# Patient Record
Sex: Male | Born: 1965 | Race: White | Hispanic: No | Marital: Single | Smoking: Current every day smoker
Health system: Southern US, Community
[De-identification: ages and names within clinical notes are randomized; demographics above are authoritative.]

## PROBLEM LIST (undated history)

## (undated) DIAGNOSIS — J45909 Unspecified asthma, uncomplicated: Secondary | ICD-10-CM

## (undated) DIAGNOSIS — R195 Other fecal abnormalities: Secondary | ICD-10-CM

## (undated) DIAGNOSIS — M1990 Unspecified osteoarthritis, unspecified site: Secondary | ICD-10-CM

## (undated) DIAGNOSIS — R0600 Dyspnea, unspecified: Secondary | ICD-10-CM

## (undated) DIAGNOSIS — J449 Chronic obstructive pulmonary disease, unspecified: Secondary | ICD-10-CM

## (undated) DIAGNOSIS — F32A Depression, unspecified: Secondary | ICD-10-CM

## (undated) DIAGNOSIS — J9691 Respiratory failure, unspecified with hypoxia: Secondary | ICD-10-CM

## (undated) DIAGNOSIS — I219 Acute myocardial infarction, unspecified: Secondary | ICD-10-CM

## (undated) DIAGNOSIS — J189 Pneumonia, unspecified organism: Secondary | ICD-10-CM

## (undated) HISTORY — PX: TONSILLECTOMY AND ADENOIDECTOMY: SHX28

## (undated) HISTORY — DX: Unspecified osteoarthritis, unspecified site: M19.90

## (undated) HISTORY — DX: Chronic obstructive pulmonary disease, unspecified: J44.9

## (undated) HISTORY — DX: Respiratory failure, unspecified with hypoxia: J96.91

## (undated) HISTORY — DX: Depression, unspecified: F32.A

## (undated) HISTORY — DX: Other fecal abnormalities: R19.5

## (undated) HISTORY — PX: SKIN GRAFT: SHX250

---

## 2004-05-19 HISTORY — PX: BOWEL RESECTION: SHX1257

## 2017-05-19 DIAGNOSIS — I219 Acute myocardial infarction, unspecified: Secondary | ICD-10-CM

## 2017-05-19 HISTORY — DX: Acute myocardial infarction, unspecified: I21.9

## 2018-06-08 DIAGNOSIS — I2510 Atherosclerotic heart disease of native coronary artery without angina pectoris: Secondary | ICD-10-CM

## 2018-06-08 HISTORY — DX: Atherosclerotic heart disease of native coronary artery without angina pectoris: I25.10

## 2019-11-04 ENCOUNTER — Other Ambulatory Visit: Payer: Self-pay

## 2019-11-04 ENCOUNTER — Inpatient Hospital Stay (HOSPITAL_COMMUNITY)
Admission: EM | Admit: 2019-11-04 | Discharge: 2019-11-10 | DRG: 190 | Disposition: A | Discharge status: Home / Self Care | Payer: Medicaid Other | Attending: Internal Medicine | Admitting: Internal Medicine

## 2019-11-04 ENCOUNTER — Encounter (HOSPITAL_COMMUNITY): Payer: Self-pay | Admitting: *Deleted

## 2019-11-04 ENCOUNTER — Emergency Department (HOSPITAL_COMMUNITY): Payer: Medicaid Other

## 2019-11-04 DIAGNOSIS — R739 Hyperglycemia, unspecified: Secondary | ICD-10-CM | POA: Diagnosis present

## 2019-11-04 DIAGNOSIS — I219 Acute myocardial infarction, unspecified: Secondary | ICD-10-CM | POA: Insufficient documentation

## 2019-11-04 DIAGNOSIS — I252 Old myocardial infarction: Secondary | ICD-10-CM | POA: Diagnosis not present

## 2019-11-04 DIAGNOSIS — Z9861 Coronary angioplasty status: Secondary | ICD-10-CM

## 2019-11-04 DIAGNOSIS — J9601 Acute respiratory failure with hypoxia: Secondary | ICD-10-CM | POA: Diagnosis present

## 2019-11-04 DIAGNOSIS — J441 Chronic obstructive pulmonary disease with (acute) exacerbation: Secondary | ICD-10-CM | POA: Diagnosis present

## 2019-11-04 DIAGNOSIS — I2510 Atherosclerotic heart disease of native coronary artery without angina pectoris: Secondary | ICD-10-CM | POA: Diagnosis present

## 2019-11-04 DIAGNOSIS — F17200 Nicotine dependence, unspecified, uncomplicated: Secondary | ICD-10-CM | POA: Diagnosis present

## 2019-11-04 DIAGNOSIS — R0603 Acute respiratory distress: Secondary | ICD-10-CM

## 2019-11-04 DIAGNOSIS — J9621 Acute and chronic respiratory failure with hypoxia: Secondary | ICD-10-CM | POA: Diagnosis present

## 2019-11-04 DIAGNOSIS — Z20822 Contact with and (suspected) exposure to covid-19: Secondary | ICD-10-CM | POA: Diagnosis present

## 2019-11-04 DIAGNOSIS — J449 Chronic obstructive pulmonary disease, unspecified: Secondary | ICD-10-CM | POA: Insufficient documentation

## 2019-11-04 DIAGNOSIS — N400 Enlarged prostate without lower urinary tract symptoms: Secondary | ICD-10-CM | POA: Diagnosis present

## 2019-11-04 HISTORY — DX: Chronic obstructive pulmonary disease, unspecified: J44.9

## 2019-11-04 HISTORY — DX: Acute myocardial infarction, unspecified: I21.9

## 2019-11-04 LAB — CBC WITH DIFFERENTIAL/PLATELET
Abs Immature Granulocytes: 0.06 10*3/uL (ref 0.00–0.07)
Basophils Absolute: 0.1 10*3/uL (ref 0.0–0.1)
Basophils Relative: 0 %
Eosinophils Absolute: 0.2 10*3/uL (ref 0.0–0.5)
Eosinophils Relative: 2 %
HCT: 55.3 % — ABNORMAL HIGH (ref 39.0–52.0)
Hemoglobin: 18.7 g/dL — ABNORMAL HIGH (ref 13.0–17.0)
Immature Granulocytes: 1 %
Lymphocytes Relative: 15 %
Lymphs Abs: 2 10*3/uL (ref 0.7–4.0)
MCH: 32.7 pg (ref 26.0–34.0)
MCHC: 33.8 g/dL (ref 30.0–36.0)
MCV: 96.8 fL (ref 80.0–100.0)
Monocytes Absolute: 0.6 10*3/uL (ref 0.1–1.0)
Monocytes Relative: 4 %
Neutro Abs: 10.3 10*3/uL — ABNORMAL HIGH (ref 1.7–7.7)
Neutrophils Relative %: 78 %
Platelets: 263 10*3/uL (ref 150–400)
RBC: 5.71 MIL/uL (ref 4.22–5.81)
RDW: 12.5 % (ref 11.5–15.5)
WBC: 13.1 10*3/uL — ABNORMAL HIGH (ref 4.0–10.5)
nRBC: 0 % (ref 0.0–0.2)

## 2019-11-04 LAB — COMPREHENSIVE METABOLIC PANEL
ALT: 23 U/L (ref 0–44)
AST: 17 U/L (ref 15–41)
Albumin: 4.4 g/dL (ref 3.5–5.0)
Alkaline Phosphatase: 84 U/L (ref 38–126)
Anion gap: 12 (ref 5–15)
BUN: 12 mg/dL (ref 6–20)
CO2: 26 mmol/L (ref 22–32)
Calcium: 8.7 mg/dL — ABNORMAL LOW (ref 8.9–10.3)
Chloride: 98 mmol/L (ref 98–111)
Creatinine, Ser: 0.81 mg/dL (ref 0.61–1.24)
GFR calc Af Amer: >60 mL/min (ref >60)
GFR calc non Af Amer: >60 mL/min (ref >60)
Glucose, Bld: 178 mg/dL — ABNORMAL HIGH (ref 70–99)
Potassium: 4.4 mmol/L (ref 3.5–5.1)
Sodium: 136 mmol/L (ref 135–145)
Total Bilirubin: 1.1 mg/dL (ref 0.3–1.2)
Total Protein: 7.4 g/dL (ref 6.5–8.1)

## 2019-11-04 LAB — BRAIN NATRIURETIC PEPTIDE: B Natriuretic Peptide: 43 pg/mL (ref 0.0–100.0)

## 2019-11-04 LAB — TROPONIN I (HIGH SENSITIVITY)
Troponin I (High Sensitivity): <2 ng/L (ref <18)
Troponin I (High Sensitivity): <2 ng/L (ref <18)

## 2019-11-04 LAB — SARS CORONAVIRUS 2 BY RT PCR (HOSPITAL ORDER, PERFORMED IN ~~LOC~~ HOSPITAL LAB): SARS Coronavirus 2: NEGATIVE

## 2019-11-04 MED ORDER — FLUTICASONE FUROATE-VILANTEROL 100-25 MCG/INH IN AEPB
1.0000 | INHALATION_SPRAY | Freq: Every day | RESPIRATORY_TRACT | Status: DC
  Administered 2019-11-04 – 2019-11-07 (×4): 1 via RESPIRATORY_TRACT
  Filled 2019-11-04: qty 28

## 2019-11-04 MED ORDER — AZITHROMYCIN 250 MG PO TABS
500.0000 mg | ORAL_TABLET | Freq: Every day | ORAL | Status: AC
  Administered 2019-11-05 – 2019-11-08 (×4): 500 mg via ORAL
  Filled 2019-11-04 (×5): qty 2

## 2019-11-04 MED ORDER — ALBUTEROL (5 MG/ML) CONTINUOUS INHALATION SOLN
INHALATION_SOLUTION | RESPIRATORY_TRACT | Status: AC
  Filled 2019-11-04: qty 20

## 2019-11-04 MED ORDER — PREDNISONE 20 MG PO TABS
40.0000 mg | ORAL_TABLET | Freq: Every day | ORAL | Status: DC
  Administered 2019-11-07: 40 mg via ORAL
  Filled 2019-11-04 (×2): qty 2

## 2019-11-04 MED ORDER — SODIUM CHLORIDE 0.9 % IV SOLN
500.0000 mg | INTRAVENOUS | Status: AC
  Administered 2019-11-04: 500 mg via INTRAVENOUS
  Filled 2019-11-04: qty 500

## 2019-11-04 MED ORDER — METHYLPREDNISOLONE SODIUM SUCC 125 MG IJ SOLR
60.0000 mg | Freq: Four times a day (QID) | INTRAMUSCULAR | Status: AC
  Administered 2019-11-04 – 2019-11-05 (×4): 60 mg via INTRAVENOUS
  Filled 2019-11-04 (×4): qty 2

## 2019-11-04 MED ORDER — ALBUTEROL (5 MG/ML) CONTINUOUS INHALATION SOLN
10.0000 mg/h | INHALATION_SOLUTION | RESPIRATORY_TRACT | Status: DC
  Administered 2019-11-04: 10 mg/h via RESPIRATORY_TRACT
  Filled 2019-11-04: qty 20

## 2019-11-04 MED ORDER — IPRATROPIUM BROMIDE 0.02 % IN SOLN: 1.0000 mg | Freq: Once | RESPIRATORY_TRACT | Status: AC

## 2019-11-04 MED ORDER — ENOXAPARIN SODIUM 40 MG/0.4ML ~~LOC~~ SOLN
40.0000 mg | SUBCUTANEOUS | Status: DC
  Administered 2019-11-04 – 2019-11-09 (×6): 40 mg via SUBCUTANEOUS
  Filled 2019-11-04 (×6): qty 0.4

## 2019-11-04 MED ORDER — IPRATROPIUM BROMIDE 0.02 % IN SOLN
RESPIRATORY_TRACT | Status: AC
  Administered 2019-11-04: 1 mg via RESPIRATORY_TRACT
  Filled 2019-11-04: qty 2.5

## 2019-11-04 MED ORDER — NICOTINE 14 MG/24HR TD PT24
14.0000 mg | MEDICATED_PATCH | Freq: Every day | TRANSDERMAL | Status: DC
  Administered 2019-11-04 – 2019-11-10 (×7): 14 mg via TRANSDERMAL
  Filled 2019-11-04 (×7): qty 1

## 2019-11-04 MED ORDER — ALBUTEROL SULFATE (2.5 MG/3ML) 0.083% IN NEBU: 2.5000 mg | INHALATION_SOLUTION | RESPIRATORY_TRACT | Status: DC | PRN

## 2019-11-04 MED ORDER — IPRATROPIUM-ALBUTEROL 0.5-2.5 (3) MG/3ML IN SOLN
3.0000 mL | Freq: Four times a day (QID) | RESPIRATORY_TRACT | Status: DC
  Administered 2019-11-04 – 2019-11-07 (×9): 3 mL via RESPIRATORY_TRACT
  Filled 2019-11-04 (×10): qty 3

## 2019-11-04 NOTE — Progress Notes (Signed)
**Note De-Identified  Obfuscation** Patient removed from BIPAP and placed on 2.5 L HFNC; tolerating well.  RRT to continue to monitor.

## 2019-11-04 NOTE — ED Triage Notes (Signed)
Pt with resp distress, pt arrived with CPAP in place, albuterol and duo neb given en route.  Magnesium given as well.

## 2019-11-04 NOTE — H&P (Signed)
History and Physical  Charles Weeks ZDG:[id number] DOB: 1965/10/29 DOA: 11/04/2019  Referring physician: Dr Estell Harpin, ED physician PCP: Patient, No Pcp Per  Outpatient Specialists:   Patient Coming From: home  Chief Complaint: SOB, cough  HPI: Charles Weeks is a 54 y.o. male with a history of CAD, MI, COPD.  Patient just recently moved to [place] from [place].  He has no medications and has not seen a physician here.  He started having shortness of breath about 3 weeks ago which has been increasing.  He has been having cough with some purulent sputum production.  He had a relative give him some steroids over the past few days, without too much improvement.  No other palliating or provoking factors.  Denies fevers, chills, nausea, vomiting.  Emergency Department Course: Patient started on BiPAP and gradually weaned to nasal cannula.  Initial saturations were in the 80s.  Improved with steroids and BiPAP.  Remained stable on nasal cannula  Review of Systems:   Pt denies any fevers, chills, nausea, vomiting, diarrhea, constipation, abdominal pain, palpitations, headache, vision changes, lightheadedness, dizziness, melena, rectal bleeding.  Review of systems are otherwise negative  Past Medical History:  Diagnosis Date  . COPD (chronic obstructive pulmonary disease) (HCC)   . Coronary artery disease   . Myocardial infarct [facility])    History reviewed. No pertinent surgical history. Social History:  reports that he has been smoking. He does not have any smokeless tobacco history on file. No history on file for alcohol use and drug use. Patient lives at home  No Known Allergies  History reviewed. No pertinent family history.  Patient unaware of family history  Prior to Admission medications   Not on File    Physical Exam: BP 118/78   Pulse (!) 113   Temp (!) 97.5 F (36.4 C) (Tympanic)   Resp (!) 34   Ht 5\' 9"  (1.753 m)   Wt 81.6 kg   SpO2 95%   BMI 26.58 kg/m    . General: Middle-age male. Awake and alert and oriented x3. No acute cardiopulmonary distress.  HEENT: Normocephalic atraumatic.  Right and left ears normal in appearance.  Pupils equal, round, reactive to light. Extraocular muscles are intact. Sclerae anicteric and noninjected.  Moist mucosal membranes. No mucosal lesions.  . Neck: Neck supple without lymphadenopathy. No carotid bruits. No masses palpated.  . Cardiovascular: Regular rate with normal S1-S2 sounds. No murmurs, rubs, gallops auscultated. No JVD.  Marland Kitchen Respiratory: Managed breath sounds.  Expiratory wheezes.  No accessory muscle use. . Abdomen: Soft, nontender, nondistended. Active bowel sounds. No masses or hepatosplenomegaly  . Skin: No rashes, lesions, or ulcerations.  Dry, warm to touch. 2+ dorsalis pedis and radial pulses. . Musculoskeletal: No calf or leg pain. All major joints not erythematous nontender.  No upper or lower joint deformation.  Good ROM.  No contractures  . Psychiatric: Intact judgment and insight. Pleasant and cooperative. . Neurologic: No focal neurological deficits. Strength is 5/5 and symmetric in upper and lower extremities.  Cranial nerves II through XII are grossly intact.           Labs on Admission: I have personally reviewed following labs and imaging studies  CBC: Recent Labs  Lab 11/04/19 1409  WBC 13.1*  NEUTROABS 10.3*  HGB 18.7*  HCT 55.3*  MCV 96.8  PLT 263   Basic Metabolic Panel: Recent Labs  Lab 11/04/19 1409  NA 136  K 4.4  CL 98  CO2 26  GLUCOSE 178*  BUN 12  CREATININE 0.81  CALCIUM 8.7*   GFR: Estimated Creatinine Clearance: 104.3 mL/min (by C-G formula based on SCr of 0.81 mg/dL). Liver Function Tests: Recent Labs  Lab 11/04/19 1409  AST 17  ALT 23  ALKPHOS 84  BILITOT 1.1  PROT 7.4  ALBUMIN 4.4   No results for input(s): LIPASE, AMYLASE in the last 168 hours. No results for input(s): AMMONIA in the last 168 hours. Coagulation Profile: No results  for input(s): INR, PROTIME in the last 168 hours. Cardiac Enzymes: No results for input(s): CKTOTAL, CKMB, CKMBINDEX, TROPONINI in the last 168 hours. BNP (last 3 results) No results for input(s): PROBNP in the last 8760 hours. HbA1C: No results for input(s): HGBA1C in the last 72 hours. CBG: No results for input(s): GLUCAP in the last 168 hours. Lipid Profile: No results for input(s): CHOL, HDL, LDLCALC, TRIG, CHOLHDL, LDLDIRECT in the last 72 hours. Thyroid Function Tests: No results for input(s): TSH, T4TOTAL, FREET4, T3FREE, THYROIDAB in the last 72 hours. Anemia Panel: No results for input(s): VITAMINB12, FOLATE, FERRITIN, TIBC, IRON, RETICCTPCT in the last 72 hours. Urine analysis: No results found for: COLORURINE, APPEARANCEUR, LABSPEC, PHURINE, GLUCOSEU, HGBUR, BILIRUBINUR, KETONESUR, PROTEINUR, UROBILINOGEN, NITRITE, LEUKOCYTESUR Sepsis Labs: @LABRCNTIP (procalcitonin:4,lacticidven:4) ) Recent Results (from the past 240 hour(s))  SARS Coronavirus 2 by RT PCR (hospital order, performed in [facility] hospital lab) Nasopharyngeal Nasopharyngeal Swab     Status: None   Collection Time: 11/04/19  2:19 PM   Specimen: Nasopharyngeal Swab  Result Value Ref Range Status   SARS Coronavirus 2 NEGATIVE NEGATIVE Final    Comment: (NOTE) SARS-CoV-2 target nucleic acids are NOT DETECTED.  The SARS-CoV-2 RNA is generally detectable in upper and lower respiratory specimens during the acute phase of infection. The lowest concentration of SARS-CoV-2 viral copies this assay can detect is 250 copies / mL. A negative result does not preclude SARS-CoV-2 infection and should not be used as the sole basis for treatment or other patient management decisions.  A negative result may occur with improper specimen collection / handling, submission of specimen other than nasopharyngeal swab, presence of viral mutation(s) within the areas targeted by this assay, and inadequate number of viral  copies (<250 copies / mL). A negative result must be combined with clinical observations, patient history, and epidemiological information.  Fact Sheet for Patients:   StrictlyIdeas.no  Fact Sheet for Healthcare Providers: BankingDealers.co.za  This test is not yet approved or  cleared by the [place] FDA and has been authorized for detection and/or diagnosis of SARS-CoV-2 by FDA under an Emergency Use Authorization (EUA).  This EUA will remain in effect (meaning this test can be used) for the duration of the COVID-19 declaration under Section 564(b)(1) of the Act, 21 U.S.C. section 360bbb-3(b)(1), unless the authorization is terminated or revoked sooner.  Performed at [facility], [address], [place], [place] [address]      Radiological Exams on Admission: DG Chest [facility] 1 View  Result Date: 11/04/2019 CLINICAL DATA:  Shortness of breath EXAM: PORTABLE CHEST 1 VIEW COMPARISON:  None. FINDINGS: The heart size and mediastinal contours are within normal limits. Both lungs are clear but hyperinflated. The visualized skeletal structures show healing rib fractures on the right. IMPRESSION: COPD without acute abnormality. Healing right rib fractures. Electronically Signed   By: Inez Catalina M.D.   On: 11/04/2019 14:36    EKG: Independently reviewed.  Sinus tachycardia.  Biatrial enlargement.  Old anterior septal infarct.  No  acute ST changes.  Assessment/Plan: Principal Problem:   Acute respiratory failure with hypoxia (HCC) Active Problems:   Coronary artery disease   COPD with acute exacerbation [facility])    This patient was discussed with the ED physician, including pertinent vitals, physical exam findings, labs, and imaging.  We also discussed care given by the ED provider.  1. Acute respiratory failure with hypoxia a. Admit b. Oxygen therapy c. Maintain oxygen above 90% 2. COPD exacerbation Antibiotics:  Azithromycin DuoNeb's every 6 scheduled with albuterol every 2 when necessary Continue inhaled steroids and LA bronchodilator Solu-Medrol 60 mg IV every 6 hours Long-acting bronchodilator and inhaled corticosteroid Mucinex 3. Coronary artery disease with history of MI a.   DVT prophylaxis: Lovenox Consultants: None Code Status: Full code Family Communication: Daughter present during interview and exam Disposition Plan: Patient should be able to return home following stabilization   Levie Heritage, DO

## 2019-11-04 NOTE — ED Notes (Signed)
Pt is asking to come off BiPAP.  Resp Tech Toniann Fail) to place pt on HFNC once breathing treatment completed.

## 2019-11-04 NOTE — ED Triage Notes (Signed)
Epi IM and Solumedrol given

## 2019-11-04 NOTE — ED Provider Notes (Signed)
[facility] EMERGENCY DEPARTMENT Provider Note   CSN: [id number] Arrival date & time: 11/04/19  1402     History Chief Complaint  Patient presents with  . Respiratory Distress    Charles Weeks is a 54 y.o. male.  Patient with a history of COPD and MI.  Patient became extremely short of breath and was seen by the paramedics barely breathing.  They gave him Solu-Medrol albuterol Atrovent epinephrine and placed him on BiPAP.  When he arrived in the emergency department he was mildly lethargic on BiPAP  The history is provided by the patient and the EMS personnel. No language interpreter was used.  Shortness of Breath Severity:  Severe Onset quality:  Sudden Timing:  Constant Progression:  Worsening Chronicity:  Recurrent Context: activity   Relieved by:  Inhaler Worsened by:  Nothing Ineffective treatments:  Inhaler Associated symptoms: wheezing   Associated symptoms: no abdominal pain, no chest pain, no cough, no headaches and no rash   Risk factors: no recent alcohol use        Past Medical History:  Diagnosis Date  . COPD (chronic obstructive pulmonary disease) ([place])   . Myocardial infarct (HCC)     There are no problems to display for this patient.   History reviewed. No pertinent surgical history.     History reviewed. No pertinent family history.  Social History   Tobacco Use  . Smoking status: Smoker, Current Status Unknown  Substance Use Topics  . Alcohol use: Not on file  . Drug use: Not on file    Home Medications Prior to Admission medications   Not on File    Allergies    Patient has no known allergies.  Review of Systems   Review of Systems  Constitutional: Positive for fatigue. Negative for appetite change.  HENT: Negative for congestion, ear discharge and sinus pressure.   Eyes: Negative for discharge.  Respiratory: Positive for shortness of breath and wheezing. Negative for cough.   Cardiovascular: Negative for chest pain.    Gastrointestinal: Negative for abdominal pain and diarrhea.  Genitourinary: Negative for frequency and hematuria.  Musculoskeletal: Negative for back pain.  Skin: Negative for rash.  Neurological: Negative for seizures and headaches.  Psychiatric/Behavioral: Negative for hallucinations.    Physical Exam Updated Vital Signs BP 117/75   Pulse (!) 107   Temp (!) 97.5 F (36.4 C) (Tympanic)   Resp (!) 27   Ht 5\' 9"  (1.753 m)   Wt 81.6 kg   SpO2 100%   BMI 26.58 kg/m   Physical Exam Vitals and nursing note reviewed.  Constitutional:      Appearance: He is well-developed.     Comments: Lethargic   HENT:     Head: Normocephalic.     Nose: Nose normal.  Eyes:     General: No scleral icterus.    Conjunctiva/sclera: Conjunctivae normal.  Neck:     Thyroid: No thyromegaly.  Cardiovascular:     Rate and Rhythm: Tachycardia present.     Heart sounds: No murmur heard.  No friction rub. No gallop.   Pulmonary:     Breath sounds: No stridor. Wheezing present. No rales.     Comments: Patient on BiPAP Chest:     Chest wall: No tenderness.  Abdominal:     General: There is no distension.     Tenderness: There is no abdominal tenderness. There is no rebound.  Musculoskeletal:        General: Normal range of motion.  Cervical back: Neck supple.  Lymphadenopathy:     Cervical: No cervical adenopathy.  Skin:    Findings: No erythema or rash.  Neurological:     Motor: No abnormal muscle tone.     Coordination: Coordination normal.     Comments: Lethargic     ED Results / Procedures / Treatments   Labs (all labs ordered are listed, but only abnormal results are displayed) Labs Reviewed  CBC WITH DIFFERENTIAL/PLATELET - Abnormal; Notable for the following components:      Result Value   WBC 13.1 (*)    Hemoglobin 18.7 (*)    HCT 55.3 (*)    Neutro Abs 10.3 (*)    All other components within normal limits  COMPREHENSIVE METABOLIC PANEL - Abnormal; Notable for the  following components:   Glucose, Bld 178 (*)    Calcium 8.7 (*)    All other components within normal limits  SARS CORONAVIRUS 2 BY RT PCR (HOSPITAL ORDER, PERFORMED IN [place] [facility] LAB)  BRAIN NATRIURETIC PEPTIDE  TROPONIN I (HIGH SENSITIVITY)  TROPONIN I (HIGH SENSITIVITY)    EKG EKG Interpretation  Date/Time:  Friday November 04 2019 14:08:03 EDT Ventricular Rate:  111 PR Interval:    QRS Duration: 98 QT Interval:  327 QTC Calculation: 445 R Axis:   84 Text Interpretation: Sinus tachycardia Ventricular premature complex Aberrant complex LAE, consider biatrial enlargement Borderline right axis deviation Anteroseptal infarct, old Minimal ST depression, inferior leads Confirmed by Bethann Berkshire [phone]) on 11/04/2019 3:26:15 PM   Radiology DG Chest [place] 1 View  Result Date: 11/04/2019 CLINICAL DATA:  Shortness of breath EXAM: PORTABLE CHEST 1 VIEW COMPARISON:  None. FINDINGS: The heart size and mediastinal contours are within normal limits. Both lungs are clear but hyperinflated. The visualized skeletal structures show healing rib fractures on the right. IMPRESSION: COPD without acute abnormality. Healing right rib fractures. Electronically Signed   By: Alcide Clever M.D.   On: 11/04/2019 14:36    Procedures Procedures (including critical care time)  Medications Ordered in ED Medications  albuterol (PROVENTIL,VENTOLIN) solution continuous neb ( Nebulization Not Given 11/04/19 1437)  ipratropium (ATROVENT) nebulizer solution 1 mg (1 mg Nebulization Given 11/04/19 1436)    ED Course  I have reviewed the triage vital signs and the nursing notes.  Pertinent labs & imaging results that were available during my care of the patient were reviewed by me and considered in my medical decision making (see chart for details). CRITICAL CARE Performed by: Bethann Berkshire Total critical care time: 45 minutes Critical care time was exclusive of separately billable procedures and treating  other patients. Critical care was necessary to treat or prevent imminent or life-threatening deterioration. Critical care was time spent personally by me on the following activities: development of treatment plan with patient and/or surrogate as well as nursing, discussions with consultants, evaluation of patient's response to treatment, examination of patient, obtaining history from patient or surrogate, ordering and performing treatments and interventions, ordering and review of laboratory studies, ordering and review of radiographic studies, pulse oximetry and re-evaluation of patient's condition.    MDM Rules/Calculators/A&P                          Patient with severe COPD exacerbation.  He improved on BiPAP will be admitted to medicine       This patient presents to the ED for concern of shortness of breath, this involves an extensive number of treatment options,  and is a complaint that carries with it a high risk of complications and morbidity.  The differential diagnosis includes MI PE COPD   Lab Tests:  I Ordered, reviewed, and interpreted labs, which included CBC and chemistries which showed elevated white count and mild low calcium Medicines ordered:   I ordered medication albuterol Atrovent and BiPAP  Imaging Studies ordered:   I ordered imaging studies which included chest x-ray and  I independently visualized and interpreted imaging which showed COPD  Additional history obtained:   Additional history obtained from EMS  Previous records obtained and reviewed   Consultations Obtained:   I consulted hospitalist and discussed lab and imaging findings  Reevaluation:  After the interventions stated above, I reevaluated the patient and found improved  Critical Interventions:  .   Final Clinical Impression(s) / ED Diagnoses Final diagnoses:  None    Rx / DC Orders ED Discharge Orders    None       Bethann Berkshire, MD 11/04/19 1604

## 2019-11-04 NOTE — ED Notes (Signed)
Hospitalist at bedside with pt and family member.

## 2019-11-05 ENCOUNTER — Encounter (HOSPITAL_COMMUNITY): Payer: Self-pay | Admitting: Family Medicine

## 2019-11-05 ENCOUNTER — Other Ambulatory Visit: Payer: Self-pay

## 2019-11-05 LAB — BASIC METABOLIC PANEL
Anion gap: 10 (ref 5–15)
BUN: 14 mg/dL (ref 6–20)
CO2: 25 mmol/L (ref 22–32)
Calcium: 9.2 mg/dL (ref 8.9–10.3)
Chloride: 100 mmol/L (ref 98–111)
Creatinine, Ser: 0.7 mg/dL (ref 0.61–1.24)
GFR calc Af Amer: >60 mL/min (ref >60)
GFR calc non Af Amer: >60 mL/min (ref >60)
Glucose, Bld: 166 mg/dL — ABNORMAL HIGH (ref 70–99)
Potassium: 3.9 mmol/L (ref 3.5–5.1)
Sodium: 135 mmol/L (ref 135–145)

## 2019-11-05 LAB — CBC WITH DIFFERENTIAL/PLATELET
Abs Immature Granulocytes: 0.02 10*3/uL (ref 0.00–0.07)
Basophils Absolute: 0 10*3/uL (ref 0.0–0.1)
Basophils Relative: 0 %
Eosinophils Absolute: 0 10*3/uL (ref 0.0–0.5)
Eosinophils Relative: 0 %
HCT: 50.1 % (ref 39.0–52.0)
Hemoglobin: 17.4 g/dL — ABNORMAL HIGH (ref 13.0–17.0)
Immature Granulocytes: 0 %
Lymphocytes Relative: 11 %
Lymphs Abs: 0.6 10*3/uL — ABNORMAL LOW (ref 0.7–4.0)
MCH: 33 pg (ref 26.0–34.0)
MCHC: 34.7 g/dL (ref 30.0–36.0)
MCV: 94.9 fL (ref 80.0–100.0)
Monocytes Absolute: 0.1 10*3/uL (ref 0.1–1.0)
Monocytes Relative: 2 %
Neutro Abs: 5.2 10*3/uL (ref 1.7–7.7)
Neutrophils Relative %: 87 %
Platelets: 239 10*3/uL (ref 150–400)
RBC: 5.28 MIL/uL (ref 4.22–5.81)
RDW: 12.2 % (ref 11.5–15.5)
WBC: 6 10*3/uL (ref 4.0–10.5)
nRBC: 0 % (ref 0.0–0.2)

## 2019-11-05 LAB — HIV ANTIBODY (ROUTINE TESTING W REFLEX): HIV Screen 4th Generation wRfx: NONREACTIVE

## 2019-11-05 LAB — GLUCOSE, CAPILLARY
Glucose-Capillary: 163 mg/dL — ABNORMAL HIGH (ref 70–99)
Glucose-Capillary: 192 mg/dL — ABNORMAL HIGH (ref 70–99)

## 2019-11-05 LAB — PHOSPHORUS: Phosphorus: 3.3 mg/dL (ref 2.5–4.6)

## 2019-11-05 LAB — MAGNESIUM: Magnesium: 2.2 mg/dL (ref 1.7–2.4)

## 2019-11-05 MED ORDER — MONTELUKAST SODIUM 10 MG PO TABS
10.0000 mg | ORAL_TABLET | Freq: Every day | ORAL | Status: DC
  Administered 2019-11-05 – 2019-11-09 (×5): 10 mg via ORAL
  Filled 2019-11-05 (×5): qty 1

## 2019-11-05 MED ORDER — ENSURE MAX PROTEIN PO LIQD
11.0000 [oz_av] | Freq: Two times a day (BID) | ORAL | Status: DC
  Administered 2019-11-06 – 2019-11-10 (×10): 11 [oz_av] via ORAL

## 2019-11-05 MED ORDER — INSULIN ASPART 100 UNIT/ML ~~LOC~~ SOLN: 0.0000 [IU] | Freq: Every day | SUBCUTANEOUS | Status: DC

## 2019-11-05 MED ORDER — INSULIN ASPART 100 UNIT/ML ~~LOC~~ SOLN
0.0000 [IU] | Freq: Three times a day (TID) | SUBCUTANEOUS | Status: DC
  Administered 2019-11-05: 2 [IU] via SUBCUTANEOUS
  Administered 2019-11-07: 1 [IU] via SUBCUTANEOUS
  Administered 2019-11-07: 3 [IU] via SUBCUTANEOUS
  Administered 2019-11-07 – 2019-11-08 (×2): 1 [IU] via SUBCUTANEOUS
  Administered 2019-11-08: 5 [IU] via SUBCUTANEOUS
  Administered 2019-11-08: 1 [IU] via SUBCUTANEOUS
  Administered 2019-11-09 (×2): 2 [IU] via SUBCUTANEOUS
  Administered 2019-11-09: 1 [IU] via SUBCUTANEOUS
  Administered 2019-11-10: 2 [IU] via SUBCUTANEOUS
  Administered 2019-11-10: 5 [IU] via SUBCUTANEOUS

## 2019-11-05 MED ORDER — TERAZOSIN HCL 1 MG PO CAPS
2.0000 mg | ORAL_CAPSULE | Freq: Every day | ORAL | Status: DC
  Administered 2019-11-05 – 2019-11-10 (×6): 2 mg via ORAL
  Filled 2019-11-05 (×6): qty 2

## 2019-11-05 MED ORDER — BUSPIRONE HCL 5 MG PO TABS
30.0000 mg | ORAL_TABLET | Freq: Two times a day (BID) | ORAL | Status: DC
  Administered 2019-11-05 – 2019-11-10 (×10): 30 mg via ORAL
  Filled 2019-11-05 (×10): qty 6

## 2019-11-05 MED ORDER — FOLIC ACID 1 MG PO TABS
1.0000 mg | ORAL_TABLET | Freq: Every day | ORAL | Status: DC
  Administered 2019-11-05 – 2019-11-10 (×6): 1 mg via ORAL
  Filled 2019-11-05 (×6): qty 1

## 2019-11-05 MED ORDER — ACETAMINOPHEN 500 MG PO TABS
1000.0000 mg | ORAL_TABLET | Freq: Four times a day (QID) | ORAL | Status: DC | PRN
  Administered 2019-11-05 – 2019-11-06 (×2): 1000 mg via ORAL
  Filled 2019-11-05 (×2): qty 2

## 2019-11-05 MED ORDER — TICAGRELOR 90 MG PO TABS
90.0000 mg | ORAL_TABLET | Freq: Two times a day (BID) | ORAL | Status: DC
  Administered 2019-11-05 – 2019-11-10 (×11): 90 mg via ORAL
  Filled 2019-11-05 (×11): qty 1

## 2019-11-05 NOTE — Progress Notes (Signed)
Pt c/o of lower back pain 7/10. MD made aware. Awaiting new orders.

## 2019-11-05 NOTE — Progress Notes (Signed)
Initial Nutrition Assessment  RD working remotely.  DOCUMENTATION CODES:   Not applicable  INTERVENTION:  Provide Ensure Max Protein po BID, each supplement provides 150 kcal and 30 grams of protein.  NUTRITION DIAGNOSIS:   Increased nutrient needs related to catabolic illness (COPD) as evidenced by estimated needs.  GOAL:   Patient will meet greater than or equal to 90% of their needs  MONITOR:   PO intake, Supplement acceptance, Labs, Weight trends, I & O's  REASON FOR ASSESSMENT:   Consult Assessment of nutrition requirement/status  ASSESSMENT:   54 year old male with PMHx of COPD, CAD, hx MI admitted with acute exacerbation of COPD, hyperglycemia.   Spoke with patient over the phone. He reports his appetite has been "fair" at home PTA. He does endorse it has been decreased from usual. He typically eats 2 meals per day. He is unable to describe details on typical intake and reports it "depends" on how he is feeling. He ate 75% of his breakfast this morning. Discussed importance of adequate intake of calories and protein. Patient is amenable to drinking Ensure Max to help meet increased needs.  Patient denies any weight loss and reports he is weight-stable. Current weight in chart is 81.6 kg (180 lbs) but unsure if this is a stated or measured weight. No weight history in chart to trend.  Medications reviewed and include: azithromycin, folic acid 1 mg daily, Novolog 0-9 units Tid, Novolog 0-5 units QHS, nicotine patch, prednisone 40 mg daily with breakfast.  Labs reviewed: glucose 166. Per MD note HgbA1c was ordered.  NUTRITION - FOCUSED PHYSICAL EXAM:  Unable to complete at this time as RD is working remotely.  Diet Order:   Diet Order            Diet Heart Room service appropriate? Yes; Fluid consistency: Thin  Diet effective now                EDUCATION NEEDS:   No education needs have been identified at this time  Skin:  Skin Assessment: Reviewed RN  Assessment  Last BM:  11/04/2019  Height:   Ht Readings from Last 1 Encounters:  11/04/19 5\' 9"  (1.753 m)   Weight:   Wt Readings from Last 1 Encounters:  11/04/19 81.6 kg   BMI:  Body mass index is 26.58 kg/m.  Estimated Nutritional Needs:   Kcal:  2100-2300  Protein:  105-115 grams  Fluid:  2.1-2.3 L/day  11/06/19, MS, RD, LDN Pager number available on Amion

## 2019-11-05 NOTE — Progress Notes (Signed)
PROGRESS NOTE  [name] SNK:[id number] DOB: 12-07-1965 DOA: 11/04/2019 PCP: Patient, No Pcp Per  HPI/Recap of past 24 hours:  Charles Weeks is a 54 y.o. male with a history of CAD, MI, COPD.  Patient just recently moved to [place] from [place].  He has no medications and has not seen a physician here.  He started having shortness of breath about 3 weeks ago which has been increasing.  He has been having cough with some purulent sputum production.  He had a relative give him some steroids over the past few days, without too much improvement.  No other palliating or provoking factors.  Denies fevers, chills, nausea, vomiting.  Emergency Department Course: Patient started on BiPAP and gradually weaned to nasal cannula.  Initial saturations were in the 80s.  Improved with steroids and BiPAP.  Remained stable on nasal cannula.  11/05/19: Seen and examined at bedside.  Reports breathing is improved.  States that in [place] he was prescribed 3 L nasal cannula continuously but has not used it since last winter.  Assessment/Plan: Principal Problem:   Acute respiratory failure with hypoxia (HCC) Active Problems:   Coronary artery disease   COPD with acute exacerbation (HCC)  Acute on chronic hypoxic respiratory failure secondary to acute COPD exacerbation Presented with hypoxia with O2 saturation in the 80s initially requiring NIV then transitioned to 3 L and now 2 L nasal cannula. States that in [place] he was prescribed 3 L nasal cannula continuously but has not used it since last winter. Continue bronchodilators, pulmonary toilet, and oxygen supplementation Personally reviewed chest x-ray which showed no lobular pulmonary infiltrates to suggest pneumonia Maintain O2 saturation greater than 88 to 90% TOC consulted to assist with PCP and pulmonology referrals  Acute COPD exacerbation Ongoing tobacco use Possibly exacerbated by viral etiology COVID-19 negative On IV  Solu-Medrol 60 mg 3 times daily x1 day We will transition to p.o. prednisone 40 mg daily on 11/06/2019 x 4 days. Continue management as stated above  Hyperglycemia Presented with elevated serum glucose Obtain hemoglobin A1c Start insulin sliding scale sensitive  Coronary artery disease/history of MI Pharmacy to review home medications Resume home regimen once verified by pharmacy Denies any anginal symptoms at the time of this exam Brilinta on list of previous medications Not on statin, obtain lipid panel  Tobacco use disorder Tobacco cessation counseling Nicotine patch  BPH Continue terazosin Monitor urine output     Code Status: Full code  Family Communication: None at bedside  Disposition Plan: Patient is from home.  Anticipate discharge to home in the next 24 to 48 hours once respiratory status is close to baseline.  May need DME oxygen for discharge.   Consultants:  None.  Procedures:  None.  Antimicrobials:  Azithromycin  DVT prophylaxis: Subcu Lovenox daily  Status is: Inpatient    Dispo: The patient is from: Home.              Anticipated d/c is to: Home.              Anticipated d/c date is: June 20               Patient currently not stable for discharge.       Objective: Vitals:   11/05/19 0158 11/05/19 0427 11/05/19 0720 11/05/19 0800  BP:  125/77  (!) 125/92  Pulse:  81  88  Resp:  16  18  Temp:  97.7 F (36.5 C)  98.3 F (  36.8 C)  TempSrc:  Oral    SpO2: 93% 95% 95% 96%  Weight:      Height:        Intake/Output Summary (Last 24 hours) at 11/05/2019 1255 Last data filed at 11/05/2019 0900 Gross per 24 hour  Intake 240 ml  Output --  Net 240 ml   Filed Weights   11/04/19 1404  Weight: 81.6 kg    Exam:  . General: 54 y.o. year-old male well developed well nourished in no acute distress.  Alert and oriented x3. . Cardiovascular: Regular rate and rhythm with no rubs or gallops.  No thyromegaly or JVD noted.    Marland Kitchen Respiratory: Mild rales at bases no wheezing noted.  Poor inspiratory effort.   . Abdomen: Soft nontender nondistended with normal bowel sounds x4 quadrants. . Musculoskeletal: No lower extremity edema. 2/4 pulses in all 4 extremities. Marland Kitchen Psychiatry: Mood is appropriate for condition and setting   Data Reviewed: CBC: Recent Labs  Lab 11/04/19 1409 11/05/19 0648  WBC 13.1* 6.0  NEUTROABS 10.3* 5.2  HGB 18.7* 17.4*  HCT 55.3* 50.1  MCV 96.8 94.9  PLT 263 239   Basic Metabolic Panel: Recent Labs  Lab 11/04/19 1409 11/05/19 0648  NA 136 135  K 4.4 3.9  CL 98 100  CO2 26 25  GLUCOSE 178* 166*  BUN 12 14  CREATININE 0.81 0.70  CALCIUM 8.7* 9.2  MG  --  2.2  PHOS  --  3.3   GFR: Estimated Creatinine Clearance: 105.6 mL/min (by C-G formula based on SCr of 0.7 mg/dL). Liver Function Tests: Recent Labs  Lab 11/04/19 1409  AST 17  ALT 23  ALKPHOS 84  BILITOT 1.1  PROT 7.4  ALBUMIN 4.4   No results for input(s): LIPASE, AMYLASE in the last 168 hours. No results for input(s): AMMONIA in the last 168 hours. Coagulation Profile: No results for input(s): INR, PROTIME in the last 168 hours. Cardiac Enzymes: No results for input(s): CKTOTAL, CKMB, CKMBINDEX, TROPONINI in the last 168 hours. BNP (last 3 results) No results for input(s): PROBNP in the last 8760 hours. HbA1C: No results for input(s): HGBA1C in the last 72 hours. CBG: No results for input(s): GLUCAP in the last 168 hours. Lipid Profile: No results for input(s): CHOL, HDL, LDLCALC, TRIG, CHOLHDL, LDLDIRECT in the last 72 hours. Thyroid Function Tests: No results for input(s): TSH, T4TOTAL, FREET4, T3FREE, THYROIDAB in the last 72 hours. Anemia Panel: No results for input(s): VITAMINB12, FOLATE, FERRITIN, TIBC, IRON, RETICCTPCT in the last 72 hours. Urine analysis: No results found for: COLORURINE, APPEARANCEUR, LABSPEC, PHURINE, GLUCOSEU, HGBUR, BILIRUBINUR, KETONESUR, PROTEINUR, UROBILINOGEN, NITRITE,  LEUKOCYTESUR Sepsis Labs: @LABRCNTIP (procalcitonin:4,lacticidven:4)  ) Recent Results (from the past 240 hour(s))  SARS Coronavirus 2 by RT PCR (hospital order, performed in [facility] hospital lab) Nasopharyngeal Nasopharyngeal Swab     Status: None   Collection Time: 11/04/19  2:19 PM   Specimen: Nasopharyngeal Swab  Result Value Ref Range Status   SARS Coronavirus 2 NEGATIVE NEGATIVE Final    Comment: (NOTE) SARS-CoV-2 target nucleic acids are NOT DETECTED.  The SARS-CoV-2 RNA is generally detectable in upper and lower respiratory specimens during the acute phase of infection. The lowest concentration of SARS-CoV-2 viral copies this assay can detect is 250 copies / mL. A negative result does not preclude SARS-CoV-2 infection and should not be used as the sole basis for treatment or other patient management decisions.  A negative result may occur with improper specimen collection /  handling, submission of specimen other than nasopharyngeal swab, presence of viral mutation(s) within the areas targeted by this assay, and inadequate number of viral copies (<250 copies / mL). A negative result must be combined with clinical observations, patient history, and epidemiological information.  Fact Sheet for Patients:   StrictlyIdeas.no  Fact Sheet for Healthcare Providers: BankingDealers.co.za  This test is not yet approved or  cleared by the [place] FDA and has been authorized for detection and/or diagnosis of SARS-CoV-2 by FDA under an Emergency Use Authorization (EUA).  This EUA will remain in effect (meaning this test can be used) for the duration of the COVID-19 declaration under Section 564(b)(1) of the Act, 21 U.S.C. section 360bbb-3(b)(1), unless the authorization is terminated or revoked sooner.  Performed at [facility], [address] [place], [place] [address]       Studies: DG Chest [facility] 1 View  Result Date:  11/04/2019 CLINICAL DATA:  Shortness of breath EXAM: PORTABLE CHEST 1 VIEW COMPARISON:  None. FINDINGS: The heart size and mediastinal contours are within normal limits. Both lungs are clear but hyperinflated. The visualized skeletal structures show healing rib fractures on the right. IMPRESSION: COPD without acute abnormality. Healing right rib fractures. Electronically Signed   By: Inez Catalina M.D.   On: 11/04/2019 14:36    Scheduled Meds: . azithromycin  500 mg Oral Daily  . enoxaparin (LOVENOX) injection  40 mg Subcutaneous Q24H  . fluticasone furoate-vilanterol  1 puff Inhalation Daily  . folic acid  1 mg Oral Daily  . ipratropium-albuterol  3 mL Nebulization Q6H  . methylPREDNISolone (SOLU-MEDROL) injection  60 mg Intravenous Q6H   Followed by  . [START ON 11/06/2019] predniSONE  40 mg Oral Q breakfast  . montelukast  10 mg Oral QHS  . nicotine  14 mg Transdermal Daily  . terazosin  2 mg Oral Daily    Continuous Infusions: . albuterol 10 mg/hr (11/04/19 1434)     LOS: 1 day     Kayleen Memos, MD [facility] Pager [phone]  If 7PM-7AM, please contact night-coverage [email or web address] Password [facility] 11/05/2019, 12:55 PM

## 2019-11-05 NOTE — Evaluation (Signed)
Physical Therapy Evaluation Patient Details Name: Charles Weeks MRN: [id number] DOB: [birth year] Today's Date: 11/05/2019   History of Present Illness  Charles Weeks is a 54 y.o. male with a history of CAD, MI, COPD.  Patient just recently moved to [place] from [place].  He has no medications and has not seen a physician here.  He started having shortness of breath about 3 weeks ago which has been increasing.  He has been having cough with some purulent sputum production.  He had a relative give him some steroids over the past few days, without too much improvement.  No other palliating or provoking factors.  Denies fevers, chills, nausea, vomiting.    Clinical Impression  Patient functioning at baseline for functional mobility and gait, limited mostly due to SOB requiring standing rest breaks before ambulating back to room, on 2 LPM with SpO2 dropping from 93 to 89% during ambulation, after returning to room and sitting at bedside, SpO2 at 90%.  Plan:  Patient discharged from physical therapy to care of nursing for ambulation daily as tolerated for length of stay.     Follow Up Recommendations No PT follow up    Equipment Recommendations  None recommended by PT    Recommendations for Other Services       Precautions / Restrictions Precautions Precautions: None Precaution Comments: Home O2 dependent Restrictions Weight Bearing Restrictions: No      Mobility  Bed Mobility Overal bed mobility: Independent                Transfers Overall transfer level: Modified independent                  Ambulation/Gait Ambulation/Gait assistance: Modified independent (Device/Increase time) Gait Distance (Feet): 100 Feet Assistive device: None Gait Pattern/deviations: WFL(Within Functional Limits) Gait velocity: decreased   General Gait Details: demonstrates good return for ambulation in room and hallways without loss of balance, limited secondary to SOB, on 2 LPM  O2 with SpO2 dropping from 93% to 89%  Stairs            Wheelchair Mobility    Modified Rankin (Stroke Patients Only)       Balance Overall balance assessment: No apparent balance deficits (not formally assessed)                                           Pertinent Vitals/Pain      Home Living Family/patient expects to be discharged to:: Private residence Living Arrangements: Other relatives Available Help at Discharge: Family;Available PRN/intermittently Type of Home: House Home Access: Stairs to enter Entrance Stairs-Rails: None Entrance Stairs-Number of Steps: 3 Home Layout: Two level;Able to live on main level with bedroom/bathroom Home Equipment: Shower seat;Bedside commode      Prior Function Level of Independence: Independent         Comments: household and short distanced community ambulator without AD     Hand Dominance        Extremity/Trunk Assessment   Upper Extremity Assessment Upper Extremity Assessment: Overall WFL for tasks assessed    Lower Extremity Assessment Lower Extremity Assessment: Overall WFL for tasks assessed    Cervical / Trunk Assessment Cervical / Trunk Assessment: Normal  Communication   Communication: No difficulties  Cognition Arousal/Alertness: Awake/alert Behavior During Therapy: WFL for tasks assessed/performed Overall Cognitive Status: Within Functional Limits for tasks assessed  General Comments      Exercises     Assessment/Plan    PT Assessment Patent does not need any further PT services  PT Problem List         PT Treatment Interventions      PT Goals (Current goals can be found in the Care Plan section)  Acute Rehab PT Goals Patient Stated Goal: return home PT Goal Formulation: With patient/family Time For Goal Achievement: 11/05/19 Potential to Achieve Goals: Good    Frequency     Barriers to discharge         Co-evaluation               AM-PAC PT "6 Clicks" Mobility  Outcome Measure Help needed turning from your back to your side while in a flat bed without using bedrails?: None Help needed moving from lying on your back to sitting on the side of a flat bed without using bedrails?: None Help needed moving to and from a bed to a chair (including a wheelchair)?: None Help needed standing up from a chair using your arms (e.g., wheelchair or bedside chair)?: None Help needed to walk in hospital room?: None Help needed climbing 3-5 steps with a railing? : None 6 Click Score: 24    End of Session Equipment Utilized During Treatment: Oxygen Activity Tolerance: Patient tolerated treatment well;Patient limited by fatigue;Other (comment) (limited due to SOB) Patient left: in bed;with family/visitor present;with call bell/phone within reach (seated at bedside)   PT Visit Diagnosis: Unsteadiness on feet (R26.81);Other abnormalities of gait and mobility (R26.89);Muscle weakness (generalized) (M62.81)    Time: 6226-3335 PT Time Calculation (min) (ACUTE ONLY): 20 min   Charges:   PT Evaluation $PT Eval Moderate Complexity: 1 Mod PT Treatments $Therapeutic Activity: 8-22 mins        11:05 AM, 11/05/19 [name], MPT Physical Therapist with [facility] [phone] office [phone] mobile phone

## 2019-11-05 NOTE — Progress Notes (Signed)
SATURATION QUALIFICATIONS: (This note is used to comply with regulatory documentation for home oxygen)  Patient Saturations on Room Air at Rest = 91%  Patient Saturations on Room Air while Ambulating = 85%  Patient Saturations on 2 Liters of oxygen while Ambulating = 93%  Please briefly explain why patient needs home oxygen:  While ambulating on room air pt complained of SOB and increase work of breathing and desated to 85%.

## 2019-11-06 LAB — GLUCOSE, CAPILLARY
Glucose-Capillary: 130 mg/dL — ABNORMAL HIGH (ref 70–99)
Glucose-Capillary: 126 mg/dL — ABNORMAL HIGH (ref 70–99)
Glucose-Capillary: 164 mg/dL — ABNORMAL HIGH (ref 70–99)
Glucose-Capillary: 127 mg/dL — ABNORMAL HIGH (ref 70–99)

## 2019-11-06 LAB — HEMOGLOBIN A1C
Hgb A1c MFr Bld: 6.3 % — ABNORMAL HIGH (ref 4.8–5.6)
Mean Plasma Glucose: 134.11 mg/dL

## 2019-11-06 MED ORDER — SODIUM CHLORIDE 3 % IN NEBU
4.0000 mL | INHALATION_SOLUTION | Freq: Two times a day (BID) | RESPIRATORY_TRACT | Status: DC
  Administered 2019-11-06 – 2019-11-07 (×2): 4 mL via RESPIRATORY_TRACT
  Filled 2019-11-06 (×2): qty 4

## 2019-11-06 MED ORDER — MORPHINE SULFATE (PF) 2 MG/ML IV SOLN
2.0000 mg | INTRAVENOUS | Status: DC | PRN
  Administered 2019-11-06 – 2019-11-10 (×15): 2 mg via INTRAVENOUS
  Filled 2019-11-06 (×15): qty 1

## 2019-11-06 MED ORDER — METHYLPREDNISOLONE SODIUM SUCC 125 MG IJ SOLR
60.0000 mg | Freq: Two times a day (BID) | INTRAMUSCULAR | Status: AC
  Administered 2019-11-06 (×2): 60 mg via INTRAVENOUS
  Filled 2019-11-06 (×2): qty 2

## 2019-11-06 MED ORDER — SENNOSIDES-DOCUSATE SODIUM 8.6-50 MG PO TABS
2.0000 | ORAL_TABLET | Freq: Every day | ORAL | Status: DC
  Administered 2019-11-06 – 2019-11-09 (×4): 2 via ORAL
  Filled 2019-11-06 (×4): qty 2

## 2019-11-06 MED ORDER — GUAIFENESIN ER 600 MG PO TB12
1200.0000 mg | ORAL_TABLET | Freq: Two times a day (BID) | ORAL | Status: AC
  Administered 2019-11-06 – 2019-11-08 (×6): 1200 mg via ORAL
  Filled 2019-11-06 (×6): qty 2

## 2019-11-06 NOTE — Discharge Summary (Signed)
PROGRESS NOTE  Hazaiah Edgecombe CHE:[id number] DOB: Jul 07, 1965 DOA: 11/04/2019 PCP: Patient, No Pcp Per  HPI/Recap of past 24 hours:  Shakeel Disney is a 54 y.o. male with a history of CAD, MI, COPD.  Patient just recently moved to [place] from [place].  He has no medications and has not seen a physician here.  He started having shortness of breath about 3 weeks ago which has been increasing.  He has been having cough with some purulent sputum production.  He had a relative give him some steroids over the past few days, without too much improvement.  No other palliating or provoking factors.  Denies fevers, chills, nausea, vomiting.  Emergency Department Course: Patient started on BiPAP and gradually weaned to nasal cannula.  Initial saturations were in the 80s.  Improved with steroids and BiPAP.  Remained stable on nasal cannula.  11/06/19: Seen and examined at bedside.  Complains of persistent dyspnea and cough.  States he is not back to his baseline.  He is on 2 L oxygen nasal cannula continuously at home.  Added IV steroids and pulmonary toilet.  Assessment/Plan: Principal Problem:   Acute respiratory failure with hypoxia (HCC) Active Problems:   Coronary artery disease   COPD with acute exacerbation (HCC)  Acute on chronic hypoxic respiratory failure secondary to acute COPD exacerbation Presented with hypoxia with O2 saturation in the 80s initially requiring NIV then transitioned to 3 L and now 2 L oxygen nasal cannula. On oxygen supplementation at baseline continuously Continue bronchodilators and oxygen supplementation Added IV steroids and pulmonary toilet Personally reviewed chest x-ray which showed no lobular pulmonary infiltrates to suggest pneumonia Maintain O2 saturation greater than 88 to 90% TOC consulted to assist with PCP and pulmonology referrals  Acute COPD exacerbation Ongoing tobacco use; tobacco cessation counseling at bedside. COVID-19 negative Added IV  steroids Solu-Medrol 60 mg twice daily for 2 doses Transition to p.o. prednisone 40 mg daily on 11/07/2019 x 3 days Continue management as stated above  Hyperglycemia/impaired glucose tolerance Presented with elevated serum glucose Hemoglobin A1c 6.3 on 619 1221 Continue insulin sliding scale sensitive  Coronary artery disease status post PCI/history of MI Denies any anginal symptoms at the time of this exam Continue Brilinta  Not on statin, obtain lipid panel  Tobacco use disorder Tobacco cessation counseling Nicotine patch  BPH Continue terazosin Monitor urine output     Code Status: Full code  Family Communication: None at bedside  Disposition Plan: Patient is from home.  Anticipate discharge to home on 11/07/2019.   Consultants:  None.  Procedures:  None.  Antimicrobials:  Azithromycin  DVT prophylaxis: Subcu Lovenox daily  Status is: Inpatient    Dispo: The patient is from: Home.              Anticipated d/c is to: Home.              Anticipated d/c date is: June 21/21              Patient currently not stable for discharge, on IV steroids.       Objective: Vitals:   11/05/19 2058 11/06/19 0531 11/06/19 0730 11/06/19 0734  BP: 114/75 (!) 122/91    Pulse: 92 75    Resp: 17 17    Temp: 97.7 F (36.5 C) (!) 97.4 F (36.3 C)    TempSrc: Oral Oral    SpO2: 94% 93% 99% 99%  Weight:      Height:  Intake/Output Summary (Last 24 hours) at 11/06/2019 1259 Last data filed at 11/06/2019 0272 Gross per 24 hour  Intake 240 ml  Output --  Net 240 ml   Filed Weights   11/04/19 1404  Weight: 81.6 kg    Exam:  . General: 54 y.o. year-old male well developed well nourished in no acute distress.  Alert and oriented x3. . Cardiovascular: Regular rate and rhythm with no rubs or gallops.  No thyromegaly or JVD noted.   Marland Kitchen Respiratory: Mild diffuse wheezing bilaterally.  Good respiratory effort. . Abdomen: Soft nontender nondistended with  normal bowel sounds x4 quadrants. . Musculoskeletal: No lower extremity edema. 2/4 pulses in all 4 extremities. Marland Kitchen Psychiatry: Mood is appropriate for condition and setting   Data Reviewed: CBC: Recent Labs  Lab 11/04/19 1409 11/05/19 0648  WBC 13.1* 6.0  NEUTROABS 10.3* 5.2  HGB 18.7* 17.4*  HCT 55.3* 50.1  MCV 96.8 94.9  PLT 263 239   Basic Metabolic Panel: Recent Labs  Lab 11/04/19 1409 11/05/19 0648  NA 136 135  K 4.4 3.9  CL 98 100  CO2 26 25  GLUCOSE 178* 166*  BUN 12 14  CREATININE 0.81 0.70  CALCIUM 8.7* 9.2  MG  --  2.2  PHOS  --  3.3   GFR: Estimated Creatinine Clearance: 105.6 mL/min (by C-G formula based on SCr of 0.7 mg/dL). Liver Function Tests: Recent Labs  Lab 11/04/19 1409  AST 17  ALT 23  ALKPHOS 84  BILITOT 1.1  PROT 7.4  ALBUMIN 4.4   No results for input(s): LIPASE, AMYLASE in the last 168 hours. No results for input(s): AMMONIA in the last 168 hours. Coagulation Profile: No results for input(s): INR, PROTIME in the last 168 hours. Cardiac Enzymes: No results for input(s): CKTOTAL, CKMB, CKMBINDEX, TROPONINI in the last 168 hours. BNP (last 3 results) No results for input(s): PROBNP in the last 8760 hours. HbA1C: Recent Labs    11/05/19 0648  HGBA1C 6.3*   CBG: Recent Labs  Lab 11/05/19 1650 11/05/19 2056 11/06/19 0829 11/06/19 1115  GLUCAP 163* 192* 130* 126*   Lipid Profile: No results for input(s): CHOL, HDL, LDLCALC, TRIG, CHOLHDL, LDLDIRECT in the last 72 hours. Thyroid Function Tests: No results for input(s): TSH, T4TOTAL, FREET4, T3FREE, THYROIDAB in the last 72 hours. Anemia Panel: No results for input(s): VITAMINB12, FOLATE, FERRITIN, TIBC, IRON, RETICCTPCT in the last 72 hours. Urine analysis: No results found for: COLORURINE, APPEARANCEUR, LABSPEC, PHURINE, GLUCOSEU, HGBUR, BILIRUBINUR, KETONESUR, PROTEINUR, UROBILINOGEN, NITRITE, LEUKOCYTESUR Sepsis  Labs: @LABRCNTIP (procalcitonin:4,lacticidven:4)  ) Recent Results (from the past 240 hour(s))  SARS Coronavirus 2 by RT PCR (hospital order, performed in [facility] hospital lab) Nasopharyngeal Nasopharyngeal Swab     Status: None   Collection Time: 11/04/19  2:19 PM   Specimen: Nasopharyngeal Swab  Result Value Ref Range Status   SARS Coronavirus 2 NEGATIVE NEGATIVE Final    Comment: (NOTE) SARS-CoV-2 target nucleic acids are NOT DETECTED.  The SARS-CoV-2 RNA is generally detectable in upper and lower respiratory specimens during the acute phase of infection. The lowest concentration of SARS-CoV-2 viral copies this assay can detect is 250 copies / mL. A negative result does not preclude SARS-CoV-2 infection and should not be used as the sole basis for treatment or other patient management decisions.  A negative result may occur with improper specimen collection / handling, submission of specimen other than nasopharyngeal swab, presence of viral mutation(s) within the areas targeted by this assay, and inadequate  number of viral copies (<250 copies / mL). A negative result must be combined with clinical observations, patient history, and epidemiological information.  Fact Sheet for Patients:   BoilerBrush.com.cy  Fact Sheet for Healthcare Providers: https://pope.com/  This test is not yet approved or  cleared by the [place] FDA and has been authorized for detection and/or diagnosis of SARS-CoV-2 by FDA under an Emergency Use Authorization (EUA).  This EUA will remain in effect (meaning this test can be used) for the duration of the COVID-19 declaration under Section 564(b)(1) of the Act, 21 U.S.C. section 360bbb-3(b)(1), unless the authorization is terminated or revoked sooner.  Performed at [facility], [address] [place], [place] [address]       Studies: No results found.  Scheduled Meds: . azithromycin  500  mg Oral Daily  . busPIRone  30 mg Oral BID  . enoxaparin (LOVENOX) injection  40 mg Subcutaneous Q24H  . fluticasone furoate-vilanterol  1 puff Inhalation Daily  . folic acid  1 mg Oral Daily  . guaiFENesin  1,200 mg Oral BID  . insulin aspart  0-5 Units Subcutaneous QHS  . insulin aspart  0-9 Units Subcutaneous TID WC  . ipratropium-albuterol  3 mL Nebulization Q6H  . methylPREDNISolone (SOLU-MEDROL) injection  60 mg Intravenous Q12H  . montelukast  10 mg Oral QHS  . nicotine  14 mg Transdermal Daily  . predniSONE  40 mg Oral Q breakfast  . Ensure Max Protein  11 oz Oral BID BM  . senna-docusate  2 tablet Oral QHS  . sodium chloride HYPERTONIC  4 mL Nebulization BID  . terazosin  2 mg Oral Daily  . ticagrelor  90 mg Oral BID    Continuous Infusions: . albuterol 10 mg/hr (11/04/19 1434)     LOS: 2 days     Darlin Drop, MD [facility] Pager [phone]  If 7PM-7AM, please contact night-coverage [email or web address] Password [facility] 11/06/2019, 12:59 PM

## 2019-11-06 NOTE — Plan of Care (Signed)
  Problem: Education: Goal: Knowledge of General Education information will improve Description Including pain rating scale, medication(s)/side effects and non-pharmacologic comfort measures Outcome: Progressing   

## 2019-11-07 LAB — GLUCOSE, CAPILLARY
Glucose-Capillary: 126 mg/dL — ABNORMAL HIGH (ref 70–99)
Glucose-Capillary: 202 mg/dL — ABNORMAL HIGH (ref 70–99)
Glucose-Capillary: 130 mg/dL — ABNORMAL HIGH (ref 70–99)
Glucose-Capillary: 152 mg/dL — ABNORMAL HIGH (ref 70–99)

## 2019-11-07 LAB — CBC WITH DIFFERENTIAL/PLATELET
Abs Immature Granulocytes: 0.08 10*3/uL — ABNORMAL HIGH (ref 0.00–0.07)
Basophils Absolute: 0 10*3/uL (ref 0.0–0.1)
Basophils Relative: 0 %
Eosinophils Absolute: 0 10*3/uL (ref 0.0–0.5)
Eosinophils Relative: 0 %
HCT: 49.8 % (ref 39.0–52.0)
Hemoglobin: 16.7 g/dL (ref 13.0–17.0)
Immature Granulocytes: 1 %
Lymphocytes Relative: 5 %
Lymphs Abs: 0.7 10*3/uL (ref 0.7–4.0)
MCH: 32.7 pg (ref 26.0–34.0)
MCHC: 33.5 g/dL (ref 30.0–36.0)
MCV: 97.5 fL (ref 80.0–100.0)
Monocytes Absolute: 0.5 10*3/uL (ref 0.1–1.0)
Monocytes Relative: 4 %
Neutro Abs: 12.5 10*3/uL — ABNORMAL HIGH (ref 1.7–7.7)
Neutrophils Relative %: 90 %
Platelets: 265 10*3/uL (ref 150–400)
RBC: 5.11 MIL/uL (ref 4.22–5.81)
RDW: 12.5 % (ref 11.5–15.5)
WBC: 13.8 10*3/uL — ABNORMAL HIGH (ref 4.0–10.5)
nRBC: 0 % (ref 0.0–0.2)

## 2019-11-07 LAB — BASIC METABOLIC PANEL
Anion gap: 9 (ref 5–15)
BUN: 24 mg/dL — ABNORMAL HIGH (ref 6–20)
CO2: 29 mmol/L (ref 22–32)
Calcium: 9.1 mg/dL (ref 8.9–10.3)
Chloride: 100 mmol/L (ref 98–111)
Creatinine, Ser: 0.8 mg/dL (ref 0.61–1.24)
GFR calc Af Amer: >60 mL/min (ref >60)
GFR calc non Af Amer: >60 mL/min (ref >60)
Glucose, Bld: 148 mg/dL — ABNORMAL HIGH (ref 70–99)
Potassium: 4.6 mmol/L (ref 3.5–5.1)
Sodium: 138 mmol/L (ref 135–145)

## 2019-11-07 LAB — LIPID PANEL
Cholesterol: 158 mg/dL (ref 0–200)
HDL: 45 mg/dL (ref >40)
LDL Cholesterol: 81 mg/dL (ref 0–99)
Total CHOL/HDL Ratio: 3.5 RATIO
Triglycerides: 160 mg/dL — ABNORMAL HIGH (ref <150)
VLDL: 32 mg/dL (ref 0–40)

## 2019-11-07 MED ORDER — LEVALBUTEROL HCL 0.63 MG/3ML IN NEBU
0.6300 mg | INHALATION_SOLUTION | Freq: Four times a day (QID) | RESPIRATORY_TRACT | Status: DC
  Administered 2019-11-07 – 2019-11-09 (×7): 0.63 mg via RESPIRATORY_TRACT
  Filled 2019-11-07 (×7): qty 3

## 2019-11-07 MED ORDER — ARFORMOTEROL TARTRATE 15 MCG/2ML IN NEBU
15.0000 ug | INHALATION_SOLUTION | Freq: Two times a day (BID) | RESPIRATORY_TRACT | Status: DC
  Administered 2019-11-07 – 2019-11-10 (×6): 15 ug via RESPIRATORY_TRACT
  Filled 2019-11-07 (×6): qty 2

## 2019-11-07 MED ORDER — IPRATROPIUM BROMIDE 0.02 % IN SOLN
0.5000 mg | Freq: Four times a day (QID) | RESPIRATORY_TRACT | Status: DC
  Administered 2019-11-07 – 2019-11-09 (×7): 0.5 mg via RESPIRATORY_TRACT
  Filled 2019-11-07 (×7): qty 2.5

## 2019-11-07 MED ORDER — BUDESONIDE 0.5 MG/2ML IN SUSP
0.5000 mg | Freq: Two times a day (BID) | RESPIRATORY_TRACT | Status: DC
  Administered 2019-11-07 – 2019-11-10 (×6): 0.5 mg via RESPIRATORY_TRACT
  Filled 2019-11-07 (×6): qty 2

## 2019-11-07 MED ORDER — METHYLPREDNISOLONE SODIUM SUCC 125 MG IJ SOLR
60.0000 mg | Freq: Two times a day (BID) | INTRAMUSCULAR | Status: DC
  Administered 2019-11-07 – 2019-11-10 (×6): 60 mg via INTRAVENOUS
  Filled 2019-11-07 (×6): qty 2

## 2019-11-07 NOTE — TOC Initial Note (Signed)
Transition of Care [facility]) - Initial/Assessment Note    Patient Details  Name: Charles Weeks MRN: [id number] Date of Birth: December 04, 1965  Transition of Care [facility]) CM/SW Contact:    Shade Flood, LCSW Phone Number: 11/07/2019, 1:37 PM  Clinical Narrative:                  Pt admitted from home. He has [facility]. Pt recently moved to [facility] and has not switched his Medicaid over yet. Spoke with Claiborne Billings with [employer] to inquire about pt. Per Claiborne Billings, pt needs to call the [facility] social services office to notify them of his move and give them his new address so they can send a letter indicating discontinuation of [facility] Medicaid and then pt can apply for [place] Medicaid.   Spoke with pt today to assess and to update on above information. Per pt, he is living with his sister who assists him as needed. He states that he is still on O2 in his hospital room. Pt was not on Home O2 prior to admission. Asked pt if he could put a credit card on file for the O2 if TOC unable to find a company to provide the O2 under his [facility] Medicaid if he needs it at dc. Pt states he would not be able to do this and neither would his sister or any other family.  TOC will provide a new PCP list to pt and will arrange follow up with Pulmonary as TOC was consulted for this need.  Expected Discharge Plan: Home/Self Care Barriers to Discharge: Continued Medical Work up   Patient Goals and CMS Choice        Expected Discharge Plan and Services Expected Discharge Plan: Home/Self Care In-house Referral: Clinical Social Work     Living arrangements for the past 2 months: Single Family Home                                      Prior Living Arrangements/Services Living arrangements for the past 2 months: Single Family Home Lives with:: Siblings Patient language and need for interpreter reviewed:: Yes Do you feel safe going back to the place where you live?: Yes      Need for Family Participation in Patient Care: Yes  (Comment) Care giver support system in place?: Yes (comment)   Criminal Activity/Legal Involvement Pertinent to Current Situation/Hospitalization: No - Comment as needed  Activities of Daily Living Home Assistive Devices/Equipment: None ADL Screening (condition at time of admission) Patient's cognitive ability adequate to safely complete daily activities?: Yes Is the patient deaf or have difficulty hearing?: No Does the patient have difficulty seeing, even when wearing glasses/contacts?: No Does the patient have difficulty concentrating, remembering, or making decisions?: No Patient able to express need for assistance with ADLs?: Yes Does the patient have difficulty dressing or bathing?: No Independently performs ADLs?: Yes (appropriate for developmental age) Does the patient have difficulty walking or climbing stairs?: No Weakness of Legs: None Weakness of Arms/Hands: None  Permission Sought/Granted                  Emotional Assessment   Attitude/Demeanor/Rapport: Engaged Affect (typically observed): Flat Orientation: : Oriented to Self, Oriented to Place, Oriented to  Time, Oriented to Situation Alcohol / Substance Use: Not Applicable Psych Involvement: No (comment)  Admission diagnosis:  Acute respiratory failure with hypoxia ([place]) [J96.01] Patient Active Problem List  Diagnosis Date Noted  . Acute respiratory failure with hypoxia (HCC) 11/04/2019  . COPD with acute exacerbation (HCC) 11/04/2019  . COPD (chronic obstructive pulmonary disease) (HCC)   . Myocardial infarct (HCC)   . Coronary artery disease    PCP:  Patient, No Pcp Per Pharmacy:   [facility] [address] [place] - [address] [place] [address] Phone: [phone] Fax: [phone]     Social Determinants of Health (SDOH) Interventions    Readmission Risk Interventions Readmission Risk Prevention Plan 11/07/2019  Medication Screening Complete   Transportation Screening Complete

## 2019-11-07 NOTE — Progress Notes (Signed)
OT Screen Note  Patient Details Name: Charles Weeks MRN: [id number] DOB: [birth year]   Cancelled Treatment:    Reason Eval/Treat Not Completed: OT screened, no needs identified, will sign off (Per PT evaluation, pt is near baseline for functional mobility. Pt reports he has been walking around his room independently. Pt also reports he feels comfortable performing BADLs; states his sister performs all IADLs at home. All acute OT needs met and will sign off. )  [place], OTR/L Acute Rehab Pager: [phone] Office: [phone] 11/07/2019, 10:22 AM

## 2019-11-07 NOTE — Progress Notes (Signed)
PROGRESS NOTE  Charles Weeks MGN:[id number] DOB: 02/26/1966 DOA: 11/04/2019 PCP: Patient, No Pcp Per  HPI/Recap of past 24 hours: Charles Weeks a 54 y.o.malewith a history of CAD, MI, COPD.Patient just recently moved to [place] from [place]. He has no medications and has not seen a physician here. He started having shortness of breath about 3 weeks ago which has been increasing. He has been having cough with some purulent sputum production. He had a relative give him some steroids over the past few days, without too much improvement. No other palliating or provoking factors. Denies fevers, chills, nausea, vomiting.  Emergency Department Course: Patient started on BiPAP and gradually weaned to nasal cannula. Initial saturations were in the 80s. Improved with steroids and BiPAP. Remained stable on nasal cannula.  11/06/19: Seen and examined at bedside.  Complains of persistent dyspnea and cough.  States he is not back to his baseline.  He is on 2 L oxygen nasal cannula continuously at home.  Added IV steroids and pulmonary toilet.  Assessment/Plan:  Acute on chronic hypoxic respiratory failure secondary to acute COPD exacerbation Presented with hypoxia with O2 saturation in the 80s initially requiring NIV then transitioned to 3 L and  -now 2 L oxygen nasal cannula. -previously on 3L at home but has not used since Oct 2020 -add brovana -continue duonebs -continue IV steroids -Personally reviewed chest x-ray which showed no lobular pulmonary infiltrates to suggest pneumonia -Maintain O2 saturation greater than 88 to 90% -TOC consulted to assist with PCP and pulmonology referrals  -Acute COPD exacerbation Ongoing tobacco use; tobacco cessation counseling at bedside. COVID-19 negative Continue IV steroids Continue management as stated above -added brovana  Hyperglycemia/impaired glucose tolerance Presented with elevated serum glucose Hemoglobin  A1c 6.3 on 619 1221 Continue insulin sliding scale sensitive  Coronary artery disease status post PCI/history of MI Denies any anginal symptoms at the time of this exam Continue Brilinta  Not on statin, obtain lipid panel  Tobacco use disorder Tobacco cessation counseling Nicotine patch  BPH Continue terazosin Monitor urine output     Code Status: Full code  Family Communication: None at bedside  Disposition Plan: Patient is from home.  Anticipate discharge to home on 11/08/2019.   Consultants:  None.  Procedures:  None.  Antimicrobials:  Azithromycin  DVT prophylaxis: Subcu Lovenox daily  Status is: Inpatient    Dispo: The patient is from: Home.  Anticipated d/c is to: Home.  Anticipated d/c date is: June 22  Patient currently not stable for discharge, on IV steroids.     Subjective: Pt overall improving, but still has some dyspnea on exertion.  Denies f/c, cp, n/v/d, abd pain.  Objective: Vitals:   11/07/19 0451 11/07/19 0725 11/07/19 0730 11/07/19 0744  BP: 140/87     Pulse: 66     Resp: 20     Temp: (!) 97.3 F (36.3 C)     TempSrc: Oral     SpO2: 90% 96% 96% 96%  Weight:      Height:        Intake/Output Summary (Last 24 hours) at 11/07/2019 1712 Last data filed at 11/07/2019 1300 Gross per 24 hour  Intake 480 ml  Output 950 ml  Net -470 ml   Weight change:  Exam:   General:  Pt is alert, follows commands appropriately, not in acute distress  HEENT: No icterus, No thrush, No neck mass, /AT  Cardiovascular: RRR, S1/S2, no rubs,  no gallops  Respiratory: diminished breath sounds.  Bibasilar wheeze  Abdomen: Soft/+BS, non tender, non distended, no guarding  Extremities: No edema, No lymphangitis, No petechiae, No rashes, no synovitis   Data Reviewed: I have personally reviewed following labs and imaging studies Basic Metabolic Panel: Recent Labs  Lab 11/04/19 1409  11/05/19 0648 11/07/19 0446  NA 136 135 138  K 4.4 3.9 4.6  CL 98 100 100  CO2 26 25 29   GLUCOSE 178* 166* 148*  BUN 12 14 24*  CREATININE 0.81 0.70 0.80  CALCIUM 8.7* 9.2 9.1  MG  --  2.2  --   PHOS  --  3.3  --    Liver Function Tests: Recent Labs  Lab 11/04/19 1409  AST 17  ALT 23  ALKPHOS 84  BILITOT 1.1  PROT 7.4  ALBUMIN 4.4   No results for input(s): LIPASE, AMYLASE in the last 168 hours. No results for input(s): AMMONIA in the last 168 hours. Coagulation Profile: No results for input(s): INR, PROTIME in the last 168 hours. CBC: Recent Labs  Lab 11/04/19 1409 11/05/19 0648 11/07/19 0446  WBC 13.1* 6.0 13.8*  NEUTROABS 10.3* 5.2 12.5*  HGB 18.7* 17.4* 16.7  HCT 55.3* 50.1 49.8  MCV 96.8 94.9 97.5  PLT 263 239 265   Cardiac Enzymes: No results for input(s): CKTOTAL, CKMB, CKMBINDEX, TROPONINI in the last 168 hours. BNP: Invalid input(s): POCBNP CBG: Recent Labs  Lab 11/06/19 1115 11/06/19 1718 11/06/19 2120 11/07/19 0800 11/07/19 1120  GLUCAP 126* 164* 127* 126* 202*   HbA1C: Recent Labs    11/05/19 0648  HGBA1C 6.3*   Urine analysis: No results found for: COLORURINE, APPEARANCEUR, LABSPEC, PHURINE, GLUCOSEU, HGBUR, BILIRUBINUR, KETONESUR, PROTEINUR, UROBILINOGEN, NITRITE, LEUKOCYTESUR Sepsis Labs: @LABRCNTIP (procalcitonin:4,lacticidven:4) ) Recent Results (from the past 240 hour(s))  SARS Coronavirus 2 by RT PCR (hospital order, performed in [facility] hospital lab) Nasopharyngeal Nasopharyngeal Swab     Status: None   Collection Time: 11/04/19  2:19 PM   Specimen: Nasopharyngeal Swab  Result Value Ref Range Status   SARS Coronavirus 2 NEGATIVE NEGATIVE Final    Comment: (NOTE) SARS-CoV-2 target nucleic acids are NOT DETECTED.  The SARS-CoV-2 RNA is generally detectable in upper and lower respiratory specimens during the acute phase of infection. The lowest concentration of SARS-CoV-2 viral copies this assay can detect is  250 copies / mL. A negative result does not preclude SARS-CoV-2 infection and should not be used as the sole basis for treatment or other patient management decisions.  A negative result may occur with improper specimen collection / handling, submission of specimen other than nasopharyngeal swab, presence of viral mutation(s) within the areas targeted by this assay, and inadequate number of viral copies (<250 copies / mL). A negative result must be combined with clinical observations, patient history, and epidemiological information.  Fact Sheet for Patients:   StrictlyIdeas.no  Fact Sheet for Healthcare Providers: BankingDealers.co.za  This test is not yet approved or  cleared by the [place] FDA and has been authorized for detection and/or diagnosis of SARS-CoV-2 by FDA under an Emergency Use Authorization (EUA).  This EUA will remain in effect (meaning this test can be used) for the duration of the COVID-19 declaration under Section 564(b)(1) of the Act, 21 U.S.C. section 360bbb-3(b)(1), unless the authorization is terminated or revoked sooner.  Performed at [facility], [address] [place], [place] [address]      Scheduled Meds: . arformoterol  15 mcg Nebulization BID  .  azithromycin  500 mg Oral Daily  . budesonide (PULMICORT) nebulizer solution  0.5 mg Nebulization BID  . busPIRone  30 mg Oral BID  . enoxaparin (LOVENOX) injection  40 mg Subcutaneous Q24H  . folic acid  1 mg Oral Daily  . guaiFENesin  1,200 mg Oral BID  . insulin aspart  0-5 Units Subcutaneous QHS  . insulin aspart  0-9 Units Subcutaneous TID WC  . ipratropium  0.5 mg Nebulization Q6H  . levalbuterol  0.63 mg Nebulization Q6H  . methylPREDNISolone (SOLU-MEDROL) injection  60 mg Intravenous Q12H  . montelukast  10 mg Oral QHS  . nicotine  14 mg Transdermal Daily  . Ensure Max Protein  11 oz Oral BID BM  . senna-docusate  2 tablet Oral QHS  .  sodium chloride HYPERTONIC  4 mL Nebulization BID  . terazosin  2 mg Oral Daily  . ticagrelor  90 mg Oral BID   Continuous Infusions:  Procedures/Studies: DG Chest Port 1 View  Result Date: 11/04/2019 CLINICAL DATA:  Shortness of breath EXAM: PORTABLE CHEST 1 VIEW COMPARISON:  None. FINDINGS: The heart size and mediastinal contours are within normal limits. Both lungs are clear but hyperinflated. The visualized skeletal structures show healing rib fractures on the right. IMPRESSION: COPD without acute abnormality. Healing right rib fractures. Electronically Signed   By: Alcide Clever M.D.   On: 11/04/2019 14:36    Catarina Hartshorn, DO  [facility]  If 7PM-7AM, please contact night-coverage [email or web address] Password [facility] 11/07/2019, 5:12 PM   LOS: 3 days

## 2019-11-08 LAB — GLUCOSE, CAPILLARY
Glucose-Capillary: 131 mg/dL — ABNORMAL HIGH (ref 70–99)
Glucose-Capillary: 274 mg/dL — ABNORMAL HIGH (ref 70–99)
Glucose-Capillary: 132 mg/dL — ABNORMAL HIGH (ref 70–99)
Glucose-Capillary: 158 mg/dL — ABNORMAL HIGH (ref 70–99)

## 2019-11-08 MED ORDER — SODIUM CHLORIDE 3 % IN NEBU
4.0000 mL | INHALATION_SOLUTION | Freq: Two times a day (BID) | RESPIRATORY_TRACT | Status: DC
  Administered 2019-11-09 – 2019-11-10 (×3): 4 mL via RESPIRATORY_TRACT
  Filled 2019-11-08 (×3): qty 4

## 2019-11-08 MED ORDER — SODIUM CHLORIDE 3 % IN NEBU: 2.0000 mL | INHALATION_SOLUTION | Freq: Two times a day (BID) | RESPIRATORY_TRACT | Status: DC

## 2019-11-08 MED ORDER — SODIUM CHLORIDE 3 % IN NEBU: 4.0000 mL | INHALATION_SOLUTION | Freq: Two times a day (BID) | RESPIRATORY_TRACT | Status: DC

## 2019-11-08 NOTE — Progress Notes (Signed)
PROGRESS NOTE  Charles Weeks RDE:[id number] DOB: 27-Dec-1965 DOA: 11/04/2019 PCP: Patient, No Pcp Per  HPI/Recap of past 24 hours: Charles Weeks a 54 y.o.malewith a history of CAD, MI, COPD.Patient just recently moved to [place] from [place]. He has no medications and has not seen a physician here. He started having shortness of breath about 3 weeks ago which has been increasing. He has been having cough with some purulent sputum production. He had a relative give him some steroids over the past few days, without too much improvement. No other palliating or provoking factors. Denies fevers, chills, nausea, vomiting.  Emergency Department Course: Patient started on BiPAP and gradually weaned to nasal cannula. Initial saturations were in the 80s. Improved with steroids and BiPAP. Remained stable on nasal cannula.  11/06/19: Seen and examined at bedside.Complains of persistent dyspnea and cough. States he is not back to his baseline. He is on 2 L oxygen nasal cannula continuously at home. Added IV steroids and pulmonary toilet.  Assessment/Plan:  Acute on chronic hypoxic respiratory failure secondary to acute COPD exacerbation Presented with hypoxia with O2 saturation in the 80s initially requiring NIV then transitioned to 3-4 L and  -now 4 Loxygennasal cannula. -previously on 3L at home but has not used since Oct 2020 -add brovana and pulmicort -add hypertonic saline nebs -continue duonebs -continue IV steroids -Personally reviewed chest x-ray which showed no lobular pulmonary infiltrates to suggest pneumonia -Maintain O2 saturation greater than 88 to 90% -TOC consulted to assist with PCP and pulmonology referrals  -Acute COPD exacerbation Ongoing tobacco use;tobacco cessation counseling at bedside. COVID-19 negative Continue IV steroids Continue management as stated above -added brovana and pulmicort -add hypertonic saline  nebs  Hyperglycemia/impaired glucose tolerance Presented with elevated serum glucose Hemoglobin A1c6.3 on 619 1221 Continueinsulin sliding scale sensitive  Coronary artery diseasestatus post PCI/history of MI Denies any anginal symptoms at the time of this exam ContinueBrilinta  Not on statin, obtain lipid panel  Tobacco use disorder Tobacco cessation counseling Nicotine patch  BPH Continue terazosin Monitor urine output     Code Status:Full code  Family Communication:None at bedside  Disposition Plan:Patient is from home. Anticipate discharge to home on 11/09/2019. if home oxygen can be set up  Consultants:  None.  Procedures:  None.  Antimicrobials:  Azithromycin  DVT prophylaxis: Subcu Lovenox daily  Status is: Inpatient    Dispo: The patient is from:Home. Anticipated d/c is UD:[facility]. Anticipated d/c date is: June 23 if home oxygen can be set up Patient currently not stable for discharge,on IV steroids.     Subjective: Pt complains of dyspnea with minimal exertion.  Denies f/c, cp, n/v/d, abd pain.  Objective: Vitals:   11/08/19 0617 11/08/19 0839 11/08/19 1320 11/08/19 1410  BP: (!) 143/94   135/87  Pulse: 67   (!) 104  Resp: 18   20  Temp: 98 F (36.7 C)   98.1 F (36.7 C)  TempSrc: Oral   Oral  SpO2: 93% 93% 93% 94%  Weight:      Height:       No intake or output data in the 24 hours ending 11/08/19 1805 Weight change:  Exam:   General:  Pt is alert, follows commands appropriately, not in acute distress  HEENT: No icterus, No thrush, No neck mass, [place]/AT  Cardiovascular: RRR, S1/S2, no rubs, no gallops  Respiratory: diminished bs.  Bibasilar rales and wheeze  Abdomen: Soft/+BS, non tender, non  distended, no guarding  Extremities: No edema, No lymphangitis, No petechiae, No rashes, no synovitis   Data Reviewed: I have personally reviewed following labs  and imaging studies Basic Metabolic Panel: Recent Labs  Lab 11/04/19 1409 11/05/19 0648 11/07/19 0446  NA 136 135 138  K 4.4 3.9 4.6  CL 98 100 100  CO2 26 25 29   GLUCOSE 178* 166* 148*  BUN 12 14 24*  CREATININE 0.81 0.70 0.80  CALCIUM 8.7* 9.2 9.1  MG  --  2.2  --   PHOS  --  3.3  --    Liver Function Tests: Recent Labs  Lab 11/04/19 1409  AST 17  ALT 23  ALKPHOS 84  BILITOT 1.1  PROT 7.4  ALBUMIN 4.4   No results for input(s): LIPASE, AMYLASE in the last 168 hours. No results for input(s): AMMONIA in the last 168 hours. Coagulation Profile: No results for input(s): INR, PROTIME in the last 168 hours. CBC: Recent Labs  Lab 11/04/19 1409 11/05/19 0648 11/07/19 0446  WBC 13.1* 6.0 13.8*  NEUTROABS 10.3* 5.2 12.5*  HGB 18.7* 17.4* 16.7  HCT 55.3* 50.1 49.8  MCV 96.8 94.9 97.5  PLT 263 239 265   Cardiac Enzymes: No results for input(s): CKTOTAL, CKMB, CKMBINDEX, TROPONINI in the last 168 hours. BNP: Invalid input(s): POCBNP CBG: Recent Labs  Lab 11/07/19 1715 11/07/19 2156 11/08/19 0703 11/08/19 1100 11/08/19 1550  GLUCAP 130* 152* 131* 274* 132*   HbA1C: No results for input(s): HGBA1C in the last 72 hours. Urine analysis: No results found for: COLORURINE, APPEARANCEUR, LABSPEC, PHURINE, GLUCOSEU, HGBUR, BILIRUBINUR, KETONESUR, PROTEINUR, UROBILINOGEN, NITRITE, LEUKOCYTESUR Sepsis Labs: @LABRCNTIP (procalcitonin:4,lacticidven:4) ) Recent Results (from the past 240 hour(s))  SARS Coronavirus 2 by RT PCR (hospital order, performed in [facility] hospital lab) Nasopharyngeal Nasopharyngeal Swab     Status: None   Collection Time: 11/04/19  2:19 PM   Specimen: Nasopharyngeal Swab  Result Value Ref Range Status   SARS Coronavirus 2 NEGATIVE NEGATIVE Final    Comment: (NOTE) SARS-CoV-2 target nucleic acids are NOT DETECTED.  The SARS-CoV-2 RNA is generally detectable in upper and lower respiratory specimens during the acute phase of infection. The  lowest concentration of SARS-CoV-2 viral copies this assay can detect is 250 copies / mL. A negative result does not preclude SARS-CoV-2 infection and should not be used as the sole basis for treatment or other patient management decisions.  A negative result may occur with improper specimen collection / handling, submission of specimen other than nasopharyngeal swab, presence of viral mutation(s) within the areas targeted by this assay, and inadequate number of viral copies (<250 copies / mL). A negative result must be combined with clinical observations, patient history, and epidemiological information.  Fact Sheet for Patients:   [facility]  Fact Sheet for Healthcare Providers: 11/06/19  This test is not yet approved or  cleared by the [place] FDA and has been authorized for detection and/or diagnosis of SARS-CoV-2 by FDA under an Emergency Use Authorization (EUA).  This EUA will remain in effect (meaning this test can be used) for the duration of the COVID-19 declaration under Section 564(b)(1) of the Act, 21 U.S.C. section 360bbb-3(b)(1), unless the authorization is terminated or revoked sooner.  Performed at [facility], [address], [place], [address] [place]      Scheduled Meds: . arformoterol  15 mcg Nebulization BID  . budesonide (PULMICORT) nebulizer solution  0.5 mg Nebulization BID  . busPIRone  30 mg Oral BID  .  enoxaparin (LOVENOX) injection  40 mg Subcutaneous Q24H  . folic acid  1 mg Oral Daily  . guaiFENesin  1,200 mg Oral BID  . insulin aspart  0-5 Units Subcutaneous QHS  . insulin aspart  0-9 Units Subcutaneous TID WC  . ipratropium  0.5 mg Nebulization Q6H  . levalbuterol  0.63 mg Nebulization Q6H  . methylPREDNISolone (SOLU-MEDROL) injection  60 mg Intravenous Q12H  . montelukast  10 mg Oral QHS  . nicotine  14 mg Transdermal Daily  . Ensure Max Protein  11 oz Oral BID BM  .  senna-docusate  2 tablet Oral QHS  . sodium chloride HYPERTONIC  4 mL Nebulization BID  . terazosin  2 mg Oral Daily  . ticagrelor  90 mg Oral BID   Continuous Infusions:  Procedures/Studies: DG Chest Port 1 View  Result Date: 11/04/2019 CLINICAL DATA:  Shortness of breath EXAM: PORTABLE CHEST 1 VIEW COMPARISON:  None. FINDINGS: The heart size and mediastinal contours are within normal limits. Both lungs are clear but hyperinflated. The visualized skeletal structures show healing rib fractures on the right. IMPRESSION: COPD without acute abnormality. Healing right rib fractures. Electronically Signed   By: Alcide Clever M.D.   On: 11/04/2019 14:36    Catarina Hartshorn, DO  [facility]  If 7PM-7AM, please contact night-coverage [email or web address] Password [email or web address] 11/08/2019, 6:05 PM   LOS: 4 days

## 2019-11-08 NOTE — TOC Progression Note (Addendum)
Transition of Care [facility]) - Progression Note    Patient Details  Name: Charles Weeks MRN: [id number] Date of Birth: 26-May-1965  Transition of Care [facility]) CM/SW Contact  Annice Needy, LCSW Phone Number: 11/08/2019, 4:01 PM  Clinical Narrative:    Patient and I spoke with [name] at [facility] office [phone]. [name] said that she cannot do the discontinuation until 6/24. He will be covered through [facility] through 7/31. Patient will receive a disconnection letter via mail. They will fax [place] the discontinuation letter on Friday for our records.  Sister, [name], advised of situation per patient request. She stated that she will ensure that patient takes his letter of [facility] Medicaid termination to [facility] DSS and apply Medicaid. She states that she has an oxygen concentrator in the home that she does not use. She states that her pulmonologist is aware that she has not used the oxygen in a year. She stated that patient in the event that oxygen cannot be arranged, there is a concentrator in the home he can use.    Expected Discharge Plan: Home/Self Care Barriers to Discharge: Continued Medical Work up  Expected Discharge Plan and Services Expected Discharge Plan: Home/Self Care In-house Referral: Clinical Social Work     Living arrangements for the past 2 months: Single Family Home                                       Social Determinants of Health (SDOH) Interventions    Readmission Risk Interventions Readmission Risk Prevention Plan 11/07/2019  Medication Screening Complete  Transportation Screening Complete

## 2019-11-09 ENCOUNTER — Encounter (HOSPITAL_COMMUNITY): Payer: Self-pay

## 2019-11-09 LAB — GLUCOSE, CAPILLARY
Glucose-Capillary: 142 mg/dL — ABNORMAL HIGH (ref 70–99)
Glucose-Capillary: 176 mg/dL — ABNORMAL HIGH (ref 70–99)
Glucose-Capillary: 152 mg/dL — ABNORMAL HIGH (ref 70–99)
Glucose-Capillary: 176 mg/dL — ABNORMAL HIGH (ref 70–99)

## 2019-11-09 MED ORDER — GUAIFENESIN ER 600 MG PO TB12
1200.0000 mg | ORAL_TABLET | Freq: Two times a day (BID) | ORAL | Status: DC
  Administered 2019-11-09 – 2019-11-10 (×2): 1200 mg via ORAL
  Filled 2019-11-09 (×2): qty 2

## 2019-11-09 MED ORDER — IPRATROPIUM BROMIDE 0.02 % IN SOLN
0.5000 mg | Freq: Four times a day (QID) | RESPIRATORY_TRACT | Status: DC
  Administered 2019-11-10: 0.5 mg via RESPIRATORY_TRACT
  Filled 2019-11-09: qty 2.5

## 2019-11-09 MED ORDER — LEVALBUTEROL HCL 0.63 MG/3ML IN NEBU
0.6300 mg | INHALATION_SOLUTION | Freq: Four times a day (QID) | RESPIRATORY_TRACT | Status: DC
  Administered 2019-11-10: 0.63 mg via RESPIRATORY_TRACT
  Filled 2019-11-09: qty 3

## 2019-11-09 NOTE — TOC Transition Note (Addendum)
Transition of Care [facility]) - CM/SW Discharge Note   Patient Details  Name: Charles Weeks MRN: [id number] Date of Birth: 1966/03/01  Transition of Care [facility]) CM/SW Contact:  Annice Needy, LCSW Phone Number: 11/09/2019, 3:30 PM   Clinical Narrative:    Aileen Fass with [employer] and [facility] financial navigator informed of patient's need to have [facility] transferred to [facility]. Ingrid to assign Charles Weeks to follow up with patient and this process past discharge.  LOG for oxygen submitted to [employer].  Charles Weeks screened patient for [facility] medicaid. Patient has SSI. Charles Weeks will inform patient that he will have to contact the [employer] to let them know he has moved. They are working on a 1 month emergency application.  Message left for [facility] with patient's demographics regarding a referral made for patient related to needing a PCP.     Final next level of care: Home/Self Care Barriers to Discharge: No Barriers Identified   Patient Goals and CMS Choice        Discharge Placement                       Discharge Plan and Services In-house Referral: Clinical Social Work              DME Arranged: Oxygen DME Agency: [employer] Date DME Agency Contacted: 11/09/19 Time DME Agency Contacted: 1530 Representative spoke with at DME Agency: [employer]            Social Determinants of Health (SDOH) Interventions     Readmission Risk Interventions Readmission Risk Prevention Plan 11/07/2019  Medication Screening Complete  Transportation Screening Complete

## 2019-11-09 NOTE — TOC Progression Note (Signed)
Transition of Care [facility]) - Progression Note    Patient Details  Name: Charles Weeks MRN: [id number] Date of Birth: 1965-09-12  Transition of [facility]) CM/SW Contact  Golda Acre, RN Phone Number: 11/09/2019, 3:59 PM  Clinical Narrative:    Match program performed patient is under number [phone].     Expected Discharge Plan: Home/Self Care Barriers to Discharge: No Barriers Identified  Expected Discharge Plan and Services Expected Discharge Plan: Home/Self Care In-house Referral: Clinical Social Work     Living arrangements for the past 2 months: Single Family Home                 DME Arranged: Oxygen DME Agency: [employer] Date DME Agency Contacted: 11/09/19 Time DME Agency Contacted: 1530 Representative spoke with at DME Agency: [employer] [employer]             Social Determinants of Health (SDOH) Interventions    Readmission Risk Interventions Readmission Risk Prevention Plan 11/07/2019  Medication Screening Complete  Transportation Screening Complete

## 2019-11-09 NOTE — Progress Notes (Signed)
Pt requested that I return later to give the rest of him nebulizer treatments that are past due. Pt states "the treatments wear him out and he needs breaks"

## 2019-11-09 NOTE — Progress Notes (Signed)
PROGRESS NOTE  Charles Weeks MWN:[id number] DOB: [birth year] DOA: 11/04/2019 PCP: Patient, No Pcp Per  HPI/Recap of past 24 hours: Charles Weeks a 54 y.o.malewith a history of CAD, MI, COPD.Patient just recently moved to [place] from [place]. He has no medications and has not seen a physician here. He started having shortness of breath about 3 weeks ago which has been increasing. He has been having cough with some purulent sputum production. He had a relative give him some steroids over the past few days, without too much improvement. No other palliating or provoking factors. Denies fevers, chills, nausea, vomiting.  Emergency Department Course: Patient started on BiPAP and gradually weaned to nasal cannula. Initial saturations were in the 80s. Improved with steroids and BiPAP. Remained stable on nasal cannula.  11/06/19: Seen and examined at bedside.Complains of persistent dyspnea and cough. States he is not back to his baseline. He is on 2 L oxygen nasal cannula continuously at home. Added IV steroids and pulmonary toilet.  Assessment/Plan:  Acute on chronic hypoxic respiratory failure secondary to acute COPD exacerbation Presented with hypoxia with O2 saturation in the 80s initially requiring NIV then transitioned to 3-4 L and  -now 4 Loxygennasal cannula. -previously on 3L at home but has not used since Oct 2020 -Continue brovana and pulmicort -Continue hypertonic saline nebs -continue duonebs -continue IV steroids -Personally reviewed chest x-ray which showed no lobular pulmonary infiltrates to suggest pneumonia -Maintain O2 saturation greater than 88 to 90% -TOC consulted to assist with PCP and pulmonology referrals  -Acute COPD exacerbation Ongoing tobacco use;tobacco cessation counseling at bedside. COVID-19 negative Continue IV steroids Continue management as stated above -Continue brovana and pulmicort -Continue hypertonic  saline nebs  Hyperglycemia/impaired glucose tolerance Presented with elevated serum glucose Hemoglobin A1c6.3 on 619 1221 Continueinsulin sliding scale sensitive  Coronary artery diseasestatus post PCI/history of MI Denies any anginal symptoms at the time of this exam ContinueBrilinta  Not on statin, obtain lipid panel  Tobacco use disorder Tobacco cessation counseling Nicotine patch  BPH Continue terazosin Monitor urine output     Code Status:Full code  Family Communication:None at bedside  Disposition Plan:Patient is from home. Anticipate discharge to home on 11/10/2019. if respiratory status has improved to the point that he can ambulate  Consultants:  None.  Procedures:  None.  Antimicrobials:  Azithromycin  DVT prophylaxis: Subcu Lovenox daily  Status is: Inpatient    Dispo: The patient is from:Home. Anticipated d/c is QI:HKVQ. Anticipated d/c date is: June 24 if patient is able to ambulate on oxygen Patient currently not stable for discharge,on IV steroids.     Subjective: Continues to complain of dyspnea on minimal exertion.  Does not feel that respiratory status is back to baseline yet.   Objective: Vitals:   11/09/19 1417 11/09/19 1933 11/09/19 2023 11/09/19 2024  BP:      Pulse:      Resp:      Temp:      TempSrc:      SpO2: 92% 93% 94% 94%  Weight:      Height:       No intake or output data in the 24 hours ending 11/09/19 2039 Weight change:  Exam:  General exam: Alert, awake, oriented x 3 Respiratory system: Diminished breath sounds bilaterally with increased work of breathing. Cardiovascular system:RRR. No murmurs, rubs, gallops. Gastrointestinal system: Abdomen is nondistended, soft and nontender. No organomegaly or masses felt. Normal bowel sounds heard.  Central nervous system: Alert and oriented. No focal neurological deficits. Extremities: No C/C/E,  +pedal pulses Skin: No rashes, lesions or ulcers  Psychiatry: Judgement and insight appear normal. Mood & affect appropriate.      Data Reviewed: I have personally reviewed following labs and imaging studies Basic Metabolic Panel: Recent Labs  Lab 11/04/19 1409 11/05/19 0648 11/07/19 0446  NA 136 135 138  K 4.4 3.9 4.6  CL 98 100 100  CO2 26 25 29   GLUCOSE 178* 166* 148*  BUN 12 14 24*  CREATININE 0.81 0.70 0.80  CALCIUM 8.7* 9.2 9.1  MG  --  2.2  --   PHOS  --  3.3  --    Liver Function Tests: Recent Labs  Lab 11/04/19 1409  AST 17  ALT 23  ALKPHOS 84  BILITOT 1.1  PROT 7.4  ALBUMIN 4.4   No results for input(s): LIPASE, AMYLASE in the last 168 hours. No results for input(s): AMMONIA in the last 168 hours. Coagulation Profile: No results for input(s): INR, PROTIME in the last 168 hours. CBC: Recent Labs  Lab 11/04/19 1409 11/05/19 0648 11/07/19 0446  WBC 13.1* 6.0 13.8*  NEUTROABS 10.3* 5.2 12.5*  HGB 18.7* 17.4* 16.7  HCT 55.3* 50.1 49.8  MCV 96.8 94.9 97.5  PLT 263 239 265   Cardiac Enzymes: No results for input(s): CKTOTAL, CKMB, CKMBINDEX, TROPONINI in the last 168 hours. BNP: Invalid input(s): POCBNP CBG: Recent Labs  Lab 11/08/19 1550 11/08/19 2151 11/09/19 0713 11/09/19 1124 11/09/19 1628  GLUCAP 132* 158* 142* 176* 152*   HbA1C: No results for input(s): HGBA1C in the last 72 hours. Urine analysis: No results found for: COLORURINE, APPEARANCEUR, LABSPEC, PHURINE, GLUCOSEU, HGBUR, BILIRUBINUR, KETONESUR, PROTEINUR, UROBILINOGEN, NITRITE, LEUKOCYTESUR Sepsis Labs: @LABRCNTIP (procalcitonin:4,lacticidven:4) ) Recent Results (from the past 240 hour(s))  SARS Coronavirus 2 by RT PCR (hospital order, performed in [facility] hospital lab) Nasopharyngeal Nasopharyngeal Swab     Status: None   Collection Time: 11/04/19  2:19 PM   Specimen: Nasopharyngeal Swab  Result Value Ref Range Status   SARS Coronavirus 2 NEGATIVE NEGATIVE Final     Comment: (NOTE) SARS-CoV-2 target nucleic acids are NOT DETECTED.  The SARS-CoV-2 RNA is generally detectable in upper and lower respiratory specimens during the acute phase of infection. The lowest concentration of SARS-CoV-2 viral copies this assay can detect is 250 copies / mL. A negative result does not preclude SARS-CoV-2 infection and should not be used as the sole basis for treatment or other patient management decisions.  A negative result may occur with improper specimen collection / handling, submission of specimen other than nasopharyngeal swab, presence of viral mutation(s) within the areas targeted by this assay, and inadequate number of viral copies (<250 copies / mL). A negative result must be combined with clinical observations, patient history, and epidemiological information.  Fact Sheet for Patients:   [facility]  Fact Sheet for Healthcare Providers: 11/06/19  This test is not yet approved or  cleared by the [employer] FDA and has been authorized for detection and/or diagnosis of SARS-CoV-2 by FDA under an Emergency Use Authorization (EUA).  This EUA will remain in effect (meaning this test can be used) for the duration of the COVID-19 declaration under Section 564(b)(1) of the Act, 21 U.S.C. section 360bbb-3(b)(1), unless the authorization is terminated or revoked sooner.  Performed at [facility], [address] [place], [address] [place]      Scheduled Meds: . arformoterol  15 mcg Nebulization BID  .  budesonide (PULMICORT) nebulizer solution  0.5 mg Nebulization BID  . busPIRone  30 mg Oral BID  . enoxaparin (LOVENOX) injection  40 mg Subcutaneous Q24H  . folic acid  1 mg Oral Daily  . guaiFENesin  1,200 mg Oral BID  . insulin aspart  0-5 Units Subcutaneous QHS  . insulin aspart  0-9 Units Subcutaneous TID WC  . [START ON 11/10/2019] ipratropium  0.5 mg Nebulization Q6H WA  . [START ON  11/10/2019] levalbuterol  0.63 mg Nebulization Q6H WA  . methylPREDNISolone (SOLU-MEDROL) injection  60 mg Intravenous Q12H  . montelukast  10 mg Oral QHS  . nicotine  14 mg Transdermal Daily  . Ensure Max Protein  11 oz Oral BID BM  . senna-docusate  2 tablet Oral QHS  . sodium chloride HYPERTONIC  4 mL Nebulization BID  . terazosin  2 mg Oral Daily  . ticagrelor  90 mg Oral BID   Continuous Infusions:  Procedures/Studies: DG Chest Port 1 View  Result Date: 11/04/2019 CLINICAL DATA:  Shortness of breath EXAM: PORTABLE CHEST 1 VIEW COMPARISON:  None. FINDINGS: The heart size and mediastinal contours are within normal limits. Both lungs are clear but hyperinflated. The visualized skeletal structures show healing rib fractures on the right. IMPRESSION: COPD without acute abnormality. Healing right rib fractures. Electronically Signed   By: Alcide Clever M.D.   On: 11/04/2019 14:36    Erick Blinks, MD  [facility]  If 7PM-7AM, please contact night-coverage [email or web address]  11/09/2019, 8:39 PM   LOS: 5 days

## 2019-11-10 LAB — GLUCOSE, CAPILLARY
Glucose-Capillary: 152 mg/dL — ABNORMAL HIGH (ref 70–99)
Glucose-Capillary: 280 mg/dL — ABNORMAL HIGH (ref 70–99)

## 2019-11-10 MED ORDER — ALBUTEROL SULFATE (2.5 MG/3ML) 0.083% IN NEBU
2.5000 mg | INHALATION_SOLUTION | Freq: Four times a day (QID) | RESPIRATORY_TRACT | 12 refills | Status: DC | PRN
Start: 2019-11-10 — End: 2019-11-10

## 2019-11-10 MED ORDER — PREDNISONE 10 MG PO TABS
ORAL_TABLET | ORAL | 0 refills | Status: DC
Start: 2019-11-10 — End: 2019-12-08

## 2019-11-10 MED ORDER — ALBUTEROL SULFATE HFA 108 (90 BASE) MCG/ACT IN AERS: 1.0000 | INHALATION_SPRAY | Freq: Four times a day (QID) | RESPIRATORY_TRACT | 1 refills | Status: DC | PRN

## 2019-11-10 MED ORDER — GUAIFENESIN ER 600 MG PO TB12: 600.0000 mg | ORAL_TABLET | Freq: Two times a day (BID) | ORAL | 0 refills | Status: DC

## 2019-11-10 MED ORDER — MONTELUKAST SODIUM 10 MG PO TABS: 10.0000 mg | ORAL_TABLET | Freq: Every day | ORAL | 0 refills | Status: DC

## 2019-11-10 MED ORDER — BUDESONIDE-FORMOTEROL FUMARATE 80-4.5 MCG/ACT IN AERO: 2.0000 | INHALATION_SPRAY | Freq: Two times a day (BID) | RESPIRATORY_TRACT | 12 refills | Status: DC

## 2019-11-10 MED ORDER — ALBUTEROL SULFATE (2.5 MG/3ML) 0.083% IN NEBU
2.5000 mg | INHALATION_SOLUTION | Freq: Four times a day (QID) | RESPIRATORY_TRACT | 12 refills | Status: DC | PRN
Start: 2019-11-10 — End: 2020-10-31

## 2019-11-10 MED ORDER — NICOTINE 14 MG/24HR TD PT24: 14.0000 mg | MEDICATED_PATCH | Freq: Every day | TRANSDERMAL | 0 refills | Status: DC

## 2019-11-10 MED ORDER — PREDNISONE 10 MG PO TABS
ORAL_TABLET | ORAL | 0 refills | Status: DC
Start: 2019-11-10 — End: 2019-11-10

## 2019-11-10 NOTE — Discharge Summary (Signed)
Physician Discharge Summary  Charles Weeks WSF:[id number] DOB: 27-Mar-1966 DOA: 11/04/2019  PCP: Charles Weeks, Charles Weeks  Admit date: 11/04/2019 Discharge date: 11/10/2019  Admitted From: Home Disposition: Home  Recommendations for Outpatient Follow-up:  1. Follow up with PCP in 1-2 weeks 2. Please obtain BMP/CBC in one week  Home Health: Equipment/Devices: Home oxygen at 3 L, nebulizer machine  Discharge Condition: Stable CODE STATUS: Full code Diet recommendation: Heart healthy  Brief/Interim Summary: Charles Harrisonis a 54 y.o.malewith a history of CAD, MI, COPD.Charles Weeks just recently moved to [place] from [place]. He has Charles medications and has not seen a physician here. He started having shortness of breath about 3 weeks ago which has been increasing. He has been having cough with some purulent sputum production. He had a relative give him some steroids over the past few days, without too much improvement. Charles other palliating or provoking factors. Denies fevers, chills, nausea, vomiting.  Emergency Department Course: Charles Weeks started on BiPAP and gradually weaned to nasal cannula. Initial saturations were in the 80s. Improved with steroids and BiPAP. Remained stable on nasal cannula.  Discharge Diagnoses:  Principal Problem:   Acute respiratory failure with hypoxia ([place]) Active Problems:   Coronary artery disease   COPD with acute exacerbation (HCC)  Acute on chronic hypoxic respiratory failure secondary to acute COPD exacerbation Presented with hypoxia with O2 saturation in the 80s initially requiring NIV then transitioned to 3-4 L  -previously on 3L at home but has not used since Oct 2020 -Continue brovana and pulmicort -Continue hypertonic saline nebs -continue duonebs -He was treated with IV steroids which have since been transitioned to prednisone taper -Personally reviewed chest x-ray which showed Charles lobular pulmonary infiltrates to suggest  pneumonia -Maintain O2 saturation greater than 88 to 90% -TOC consulted to assist with PCP and pulmonology referrals -Currently, he can ambulate comfortably on 3 L of oxygen which appears to be near his baseline  -Acute COPD exacerbation Ongoing tobacco use;tobacco cessation counseling at bedside. COVID-19 negative Treated with IV steroids which was subsequently transitioned to prednisone taper Continue management as stated above -Continue Symbicort on discharge -Continue albuterol nebs as needed, provided with nebulizer machine  Hyperglycemia/impaired glucose tolerance Presented with elevated serum glucose Hemoglobin A1c6.3 on 619 1221 Monitored on sliding scale insulin  Coronary artery diseasestatus post PCI/history of MI Denies any anginal symptoms at the time of this exam ContinueBrilinta  Not on statin, obtain lipid panel  Tobacco use disorder Tobacco cessation counseling Nicotine patch  BPH Continue terazosin Monitor urine output   Discharge Instructions  Discharge Instructions    Diet - low sodium heart healthy   Complete by: As directed    For home use only DME Nebulizer machine   Complete by: As directed    Charles Weeks needs a nebulizer to treat with the following condition: COPD (chronic obstructive pulmonary disease) ([place])   Length of Need: Lifetime   Increase activity slowly   Complete by: As directed      Allergies as of 11/10/2019   Charles Known Allergies     Medication List    TAKE these medications   albuterol (2.5 MG/3ML) 0.083% nebulizer solution Commonly known as: PROVENTIL Take 3 mLs (2.5 mg total) by nebulization every 6 (six) hours as needed for wheezing or shortness of breath. What changed: You were already taking a medication with the same name, and this prescription was added. Make sure you understand how and when to take each.   albuterol 108 (90 Base) MCG/ACT  inhaler Commonly known as: VENTOLIN HFA Inhale 1-2 puffs into the lungs  every 6 (six) hours as needed for wheezing or shortness of breath. What changed: how much to take   budesonide-formoterol 80-4.5 MCG/ACT inhaler Commonly known as: SYMBICORT Inhale 2 puffs into the lungs 2 (two) times daily. What changed: when to take this   busPIRone 30 MG tablet Commonly known as: BUSPAR Take 30 mg by mouth 2 (two) times daily.   folic acid 1 MG tablet Commonly known as: FOLVITE Take 1 mg by mouth daily.   guaiFENesin 600 MG 12 hr tablet Commonly known as: MUCINEX Take 1 tablet (600 mg total) by mouth 2 (two) times daily.   metoprolol tartrate 25 MG tablet Commonly known as: LOPRESSOR Take 25 mg by mouth 2 (two) times daily.   montelukast 10 MG tablet Commonly known as: SINGULAIR Take 1 tablet (10 mg total) by mouth at bedtime.   nicotine 14 mg/24hr patch Commonly known as: NICODERM CQ - dosed in mg/24 hours Place 1 patch (14 mg total) onto the skin daily. Start taking on: November 11, 2019   predniSONE 10 MG tablet Commonly known as: DELTASONE Take 40mg  po daily for 2 days then 30mg  daily for 2 days then 20mg  daily for 2 days then 10mg  daily for 2 days then stop   terazosin 2 MG capsule Commonly known as: HYTRIN Take 2 mg by mouth daily.   ticagrelor 90 MG Tabs tablet Commonly known as: BRILINTA Take by mouth 2 (two) times daily.   tiZANidine 4 MG capsule Commonly known as: ZANAFLEX Take 4 mg by mouth 3 (three) times daily.            Durable Medical Equipment  (From admission, onward)         Start     Ordered   11/10/19 0000  For home use only DME Nebulizer machine       Question Answer Comment  Charles Weeks needs a nebulizer to treat with the following condition COPD (chronic obstructive pulmonary disease) (HCC)   Length of Need Lifetime      11/10/19 1343          Charles Known Allergies  Consultations:     Procedures/Studies: DG Chest Port 1 View  Result Date: 11/04/2019 CLINICAL DATA:  Shortness of breath EXAM: PORTABLE  CHEST 1 VIEW COMPARISON:  None. FINDINGS: The heart size and mediastinal contours are within normal limits. Both lungs are clear but hyperinflated. The visualized skeletal structures show healing rib fractures on the right. IMPRESSION: COPD without acute abnormality. Healing right rib fractures. Electronically Signed   By: M.D.   On: 11/04/2019 14:36       Subjective: He is feeling better today.  Charles wheezing.  Feels that respiratory status is approaching baseline  Discharge Exam: Vitals:   11/10/19 0429 11/10/19 0743 11/10/19 0858 11/10/19 1212  BP: (!) 135/95   127/81  Pulse: 71   72  Resp: 20     Temp: 97.8 F (36.6 C)   98 F (36.7 C)  TempSrc: Oral   Oral  SpO2: 94% 92% 96% 96%  Weight:      Height:        General: Pt is alert, awake, not in acute distress Cardiovascular: RRR, S1/S2 +, Charles rubs, Charles gallops Respiratory: Diminished breath sounds bilaterally, Charles wheezing Abdominal: Soft, NT, ND, bowel sounds + Extremities: Charles edema, Charles cyanosis    The results of significant diagnostics from this hospitalization (including imaging,  microbiology, ancillary and laboratory) are listed below for reference.     Microbiology: Recent Results (from the past 240 hour(s))  SARS Coronavirus 2 by RT PCR (hospital order, performed in [facility] hospital lab) Nasopharyngeal Nasopharyngeal Swab     Status: None   Collection Time: 11/04/19  2:19 PM   Specimen: Nasopharyngeal Swab  Result Value Ref Range Status   SARS Coronavirus 2 NEGATIVE NEGATIVE Final    Comment: (NOTE) SARS-CoV-2 target nucleic acids are NOT DETECTED.  The SARS-CoV-2 RNA is generally detectable in upper and lower respiratory specimens during the acute phase of infection. The lowest concentration of SARS-CoV-2 viral copies this assay can detect is 250 copies / mL. A negative result does not preclude SARS-CoV-2 infection and should not be used as the sole basis for treatment or other Charles Weeks  management decisions.  A negative result may occur with improper specimen collection / handling, submission of specimen other than nasopharyngeal swab, presence of viral mutation(s) within the areas targeted by this assay, and inadequate number of viral copies (<250 copies / mL). A negative result must be combined with clinical observations, Charles Weeks history, and epidemiological information.  Fact Sheet for Patients:   BoilerBrush.com.cy  Fact Sheet for Healthcare Providers: https://pope.com/  This test is not yet approved or  cleared by the [place] FDA and has been authorized for detection and/or diagnosis of SARS-CoV-2 by FDA under an Emergency Use Authorization (EUA).  This EUA will remain in effect (meaning this test can be used) for the duration of the COVID-19 declaration under Section 564(b)(1) of the Act, 21 U.S.C. section 360bbb-3(b)(1), unless the authorization is terminated or revoked sooner.  Performed at [facility], [address], [place], [place] [address]      Labs: BNP (last 3 results) Recent Labs    11/04/19 1409  BNP 43.0   Basic Metabolic Panel: Recent Labs  Lab 11/04/19 1409 11/05/19 0648 11/07/19 0446  NA 136 135 138  K 4.4 3.9 4.6  CL 98 100 100  CO2 26 25 29   GLUCOSE 178* 166* 148*  BUN 12 14 24*  CREATININE 0.81 0.70 0.80  CALCIUM 8.7* 9.2 9.1  MG  --  2.2  --   PHOS  --  3.3  --    Liver Function Tests: Recent Labs  Lab 11/04/19 1409  AST 17  ALT 23  ALKPHOS 84  BILITOT 1.1  PROT 7.4  ALBUMIN 4.4   Charles results for input(s): LIPASE, AMYLASE in the last 168 hours. Charles results for input(s): AMMONIA in the last 168 hours. CBC: Recent Labs  Lab 11/04/19 1409 11/05/19 0648 11/07/19 0446  WBC 13.1* 6.0 13.8*  NEUTROABS 10.3* 5.2 12.5*  HGB 18.7* 17.4* 16.7  HCT 55.3* 50.1 49.8  MCV 96.8 94.9 97.5  PLT 263 239 265   Cardiac Enzymes: Charles results for input(s): CKTOTAL, CKMB,  CKMBINDEX, TROPONINI in the last 168 hours. BNP: Invalid input(s): POCBNP CBG: Recent Labs  Lab 11/09/19 1124 11/09/19 1628 11/09/19 2159 11/10/19 0752 11/10/19 1054  GLUCAP 176* 152* 176* 152* 280*   D-Dimer Charles results for input(s): DDIMER in the last 72 hours. Hgb A1c Charles results for input(s): HGBA1C in the last 72 hours. Lipid Profile Charles results for input(s): CHOL, HDL, LDLCALC, TRIG, CHOLHDL, LDLDIRECT in the last 72 hours. Thyroid function studies Charles results for input(s): TSH, T4TOTAL, T3FREE, THYROIDAB in the last 72 hours.  Invalid input(s): FREET3 Anemia work up Charles results for input(s): VITAMINB12, FOLATE, FERRITIN, TIBC, IRON,  RETICCTPCT in the last 72 hours. Urinalysis Charles results found for: COLORURINE, APPEARANCEUR, LABSPEC, PHURINE, GLUCOSEU, HGBUR, BILIRUBINUR, KETONESUR, PROTEINUR, UROBILINOGEN, NITRITE, LEUKOCYTESUR Sepsis Labs Invalid input(s): PROCALCITONIN,  WBC,  LACTICIDVEN Microbiology Recent Results (from the past 240 hour(s))  SARS Coronavirus 2 by RT PCR (hospital order, performed in [facility] hospital lab) Nasopharyngeal Nasopharyngeal Swab     Status: None   Collection Time: 11/04/19  2:19 PM   Specimen: Nasopharyngeal Swab  Result Value Ref Range Status   SARS Coronavirus 2 NEGATIVE NEGATIVE Final    Comment: (NOTE) SARS-CoV-2 target nucleic acids are NOT DETECTED.  The SARS-CoV-2 RNA is generally detectable in upper and lower respiratory specimens during the acute phase of infection. The lowest concentration of SARS-CoV-2 viral copies this assay can detect is 250 copies / mL. A negative result does not preclude SARS-CoV-2 infection and should not be used as the sole basis for treatment or other Charles Weeks management decisions.  A negative result may occur with improper specimen collection / handling, submission of specimen other than nasopharyngeal swab, presence of viral mutation(s) within the areas targeted by this assay, and inadequate number  of viral copies (<250 copies / mL). A negative result must be combined with clinical observations, Charles Weeks history, and epidemiological information.  Fact Sheet for Patients:   BoilerBrush.com.cy  Fact Sheet for Healthcare Providers: https://pope.com/  This test is not yet approved or  cleared by the [place] FDA and has been authorized for detection and/or diagnosis of SARS-CoV-2 by FDA under an Emergency Use Authorization (EUA).  This EUA will remain in effect (meaning this test can be used) for the duration of the COVID-19 declaration under Section 564(b)(1) of the Act, 21 U.S.C. section 360bbb-3(b)(1), unless the authorization is terminated or revoked sooner.  Performed at [facility], [address], [place], [place] [address]      Time coordinating discharge:  SIGNED:   Erick Blinks, MD  [facility] 11/10/2019, 8:44 PM   If 7PM-7AM, please contact night-coverage [email or web address]

## 2019-11-10 NOTE — TOC Transition Note (Signed)
Transition of Care [facility]) - CM/SW Discharge Note   Patient Details  Name: Charles Weeks MRN: [id number] Date of Birth: [birth year]  [facility] of [facility]) CM/SW Contact:  Annice Needy, LCSW Phone Number: 11/10/2019, 2:57 PM   Clinical Narrative:    Patient referred to Integrated Health Program.    Final next level of care: Home/Self Care Barriers to Discharge: No Barriers Identified   Patient Goals and CMS Choice        Discharge Placement                       Discharge Plan and Services In-house Referral: Clinical Social Work Discharge Planning Services: CM Consult, MATCH Program            DME Arranged: Oxygen DME Agency: [employer] Date DME Agency Contacted: 11/09/19 Time DME Agency Contacted: 1530 Representative spoke with at DME Agency: [facility]            Social Determinants of Health (SDOH) Interventions     Readmission Risk Interventions Readmission Risk Prevention Plan 11/07/2019  Medication Screening Complete  Transportation Screening Complete

## 2019-11-10 NOTE — Progress Notes (Signed)
Nsg Discharge Note  Admit Date:  11/04/2019 Discharge date: 11/10/2019   Charles Weeks to be D/C'd Home per MD order.  AVS completed.  Copy for chart, and copy for patient signed, and dated. Patient/caregiver able to verbalize understanding.  Discharge Medication: Allergies as of 11/10/2019   No Known Allergies     Medication List    TAKE these medications   albuterol (2.5 MG/3ML) 0.083% nebulizer solution Commonly known as: PROVENTIL Take 3 mLs (2.5 mg total) by nebulization every 6 (six) hours as needed for wheezing or shortness of breath. What changed: You were already taking a medication with the same name, and this prescription was added. Make sure you understand how and when to take each.   albuterol 108 (90 Base) MCG/ACT inhaler Commonly known as: VENTOLIN HFA Inhale 1-2 puffs into the lungs every 6 (six) hours as needed for wheezing or shortness of breath. What changed: how much to take   budesonide-formoterol 80-4.5 MCG/ACT inhaler Commonly known as: SYMBICORT Inhale 2 puffs into the lungs 2 (two) times daily. What changed: when to take this   busPIRone 30 MG tablet Commonly known as: BUSPAR Take 30 mg by mouth 2 (two) times daily.   folic acid 1 MG tablet Commonly known as: FOLVITE Take 1 mg by mouth daily.   guaiFENesin 600 MG 12 hr tablet Commonly known as: MUCINEX Take 1 tablet (600 mg total) by mouth 2 (two) times daily.   metoprolol tartrate 25 MG tablet Commonly known as: LOPRESSOR Take 25 mg by mouth 2 (two) times daily.   montelukast 10 MG tablet Commonly known as: SINGULAIR Take 1 tablet (10 mg total) by mouth at bedtime.   nicotine 14 mg/24hr patch Commonly known as: NICODERM CQ - dosed in mg/24 hours Place 1 patch (14 mg total) onto the skin daily. Start taking on: November 11, 2019   predniSONE 10 MG tablet Commonly known as: DELTASONE Take 40mg  po daily for 2 days then 30mg  daily for 2 days then 20mg  daily for 2 days then 10mg  daily for 2  days then stop   terazosin 2 MG capsule Commonly known as: HYTRIN Take 2 mg by mouth daily.   ticagrelor 90 MG Tabs tablet Commonly known as: BRILINTA Take by mouth 2 (two) times daily.   tiZANidine 4 MG capsule Commonly known as: ZANAFLEX Take 4 mg by mouth 3 (three) times daily.            Durable Medical Equipment  (From admission, onward)         Start     Ordered   11/10/19 0000  For home use only DME Nebulizer machine       Question Answer Comment  Patient needs a nebulizer to treat with the following condition COPD (chronic obstructive pulmonary disease) ([place])   Length of Need Lifetime      11/10/19 1343   11/09/19 1555  For home use only DME oxygen  Once       Question Answer Comment  Length of Need 6 Months   Mode or (Route) Nasal cannula   Liters per Minute 3   Frequency Continuous (stationary and portable oxygen unit needed)   Oxygen conserving device Yes   Oxygen delivery system Gas      11/09/19 1554          Discharge Assessment: Vitals:   11/10/19 0858 11/10/19 1212  BP:  127/81  Pulse:  72  Resp:    Temp:  98 F (36.7 C)  SpO2: 96% 96%   Skin clean, dry and intact without evidence of skin break down, no evidence of skin tears noted. IV catheter discontinued intact. Site without signs and symptoms of complications - no redness or edema noted at insertion site, patient denies c/o pain - only slight tenderness at site.  Dressing with slight pressure applied.  D/c Instructions-Education: Discharge instructions given to patient/family with verbalized understanding. D/c education completed with patient/family including follow up instructions, medication list, d/c activities limitations if indicated, with other d/c instructions as indicated by MD - patient able to verbalize understanding, all questions fully answered. Patient instructed to return to ED, call 911, or call MD for any changes in condition.  Patient escorted via WC, and D/C home via  private auto.  Brandy Hale, LPN 8/00/6349 4:94 PM

## 2019-12-02 ENCOUNTER — Ambulatory Visit: Payer: Medicaid - Out of State | Admitting: Internal Medicine

## 2019-12-08 ENCOUNTER — Ambulatory Visit (INDEPENDENT_AMBULATORY_CARE_PROVIDER_SITE_OTHER): Payer: Medicaid Other | Admitting: Family Medicine

## 2019-12-08 ENCOUNTER — Other Ambulatory Visit: Payer: Self-pay

## 2019-12-08 ENCOUNTER — Encounter: Payer: Self-pay | Admitting: Family Medicine

## 2019-12-08 VITALS — BP 123/82 | HR 90 | Temp 97.3°F | Resp 18 | Ht 68.0 in | Wt 184.4 lb

## 2019-12-08 DIAGNOSIS — I10 Essential (primary) hypertension: Secondary | ICD-10-CM

## 2019-12-08 DIAGNOSIS — Z1211 Encounter for screening for malignant neoplasm of colon: Secondary | ICD-10-CM

## 2019-12-08 DIAGNOSIS — J449 Chronic obstructive pulmonary disease, unspecified: Secondary | ICD-10-CM | POA: Diagnosis not present

## 2019-12-08 DIAGNOSIS — R3912 Poor urinary stream: Secondary | ICD-10-CM

## 2019-12-08 DIAGNOSIS — N401 Enlarged prostate with lower urinary tract symptoms: Secondary | ICD-10-CM | POA: Diagnosis not present

## 2019-12-08 DIAGNOSIS — I2510 Atherosclerotic heart disease of native coronary artery without angina pectoris: Secondary | ICD-10-CM

## 2019-12-08 HISTORY — DX: Benign prostatic hyperplasia with lower urinary tract symptoms: N40.1

## 2019-12-08 HISTORY — DX: Encounter for screening for malignant neoplasm of colon: Z12.11

## 2019-12-08 HISTORY — DX: Essential (primary) hypertension: I10

## 2019-12-08 MED ORDER — TERAZOSIN HCL 2 MG PO CAPS: 2.0000 mg | ORAL_CAPSULE | Freq: Every day | ORAL | 0 refills | Status: DC

## 2019-12-08 MED ORDER — TAMSULOSIN HCL 0.4 MG PO CAPS: 0.4000 mg | ORAL_CAPSULE | Freq: Every day | ORAL | 3 refills | Status: DC

## 2019-12-08 MED ORDER — METOPROLOL TARTRATE 25 MG PO TABS: 25.0000 mg | ORAL_TABLET | Freq: Two times a day (BID) | ORAL | 0 refills | Status: DC

## 2019-12-08 MED ORDER — MONTELUKAST SODIUM 10 MG PO TABS: 10.0000 mg | ORAL_TABLET | Freq: Every day | ORAL | 0 refills | Status: DC

## 2019-12-08 NOTE — Assessment & Plan Note (Signed)
Referral to pulmonology made today.

## 2019-12-08 NOTE — Assessment & Plan Note (Signed)
Charles Weeks is encouraged to maintain a well balanced diet that is low in salt. Is currently on Lopressor not at favorite for monotherapy.  Would like to see if he can be on an ACE inhibitor.  However given his COPD and risk of cough that might not be a definitive way versus amlodipine.  We will continue all current medications until he is assessed by cardiology.  Appreciate collaboration in his care.  DASH diet and exercise recommended as tolerated.

## 2019-12-08 NOTE — Assessment & Plan Note (Signed)
He reports he has had a stent.  Need records.  For better information.  Is on Brilinta.  I have advised him to continue this until he gets in with cardiology for additional work-up and assessment.  He is okay with doing that.

## 2019-12-08 NOTE — Progress Notes (Signed)
Subjective:  Patient ID: Charles Weeks, male    DOB: November 16, 1965  Age: 54 y.o. MRN: [id number]  CC:  Chief Complaint  Patient presents with  . New Patient (Initial Visit)    new pt just moved here from [place] just needs med refills       HPI  HPI   Charles Weeks is a 54 y.o. male with a history of CAD, MI, COPD.    [place] and moved from [place].   He ended up going to the emergency room on 618 and was admitted secondary to shortness of breath and cough COPD exacerbation.  Had to be started on BiPAP and steroids.  He is doing much better he is not smoking anymore.  Reports that he does have oxygen at home 3 L as needed usually at night.  He does not use to see past.  He needs to get in with a pulmonologist, cardiologist as he has had a stent placed as well, a gastroenterologist secondary to having 8 inches of his colon removed in need of colonoscopy, and possible urologist as he has significant BPH and has what he thinks is a TURP procedure in description.  He denies having any sleep trouble.  Denies having any changes in his dentition or appetite outside of increased secondary to stopping smoking.  Denies having any changes in bowel or bladder habits.  Denies having any blood in urine or stool.  Is taking Brilinta does bleed very easily secondary to this.  Also know if he can stop taking it.  No memory issues.  No changes in vision.  No changes in hearing.  Denies having any falls or injuries.  He denies having any chest pain, shortness of breath, cough outside of his baseline, leg swelling, headaches.  He reports that the BuSpar does not help with his anxiety.  He is unsure why he has anxiety.  But he knows that it does not work and would like to do an adjustment to this once he can.  Today patient denies signs and symptoms of COVID 19 infection including fever, chills, cough, shortness of breath, and headache. Past Medical, Surgical, Social History, Allergies,  and Medications have been Reviewed.   Past Medical History:  Diagnosis Date  . Acute respiratory failure with hypoxia (HCC) 11/04/2019  . Anxiety   . Arthritis   . COPD (chronic obstructive pulmonary disease) (HCC)   . COPD with acute exacerbation (HCC) 11/04/2019  . Coronary artery disease   . Depression   . Emphysema of lung (HCC)   . Hypertension   . Myocardial infarct (HCC)   . Oxygen deficiency     Current Meds  Medication Sig  . albuterol (PROVENTIL) (2.5 MG/3ML) 0.083% nebulizer solution Take 3 mLs (2.5 mg total) by nebulization every 6 (six) hours as needed for wheezing or shortness of breath.  Marland Kitchen albuterol (VENTOLIN HFA) 108 (90 Base) MCG/ACT inhaler Inhale 1-2 puffs into the lungs every 6 (six) hours as needed for wheezing or shortness of breath.  . budesonide-formoterol (SYMBICORT) 80-4.5 MCG/ACT inhaler Inhale 2 puffs into the lungs 2 (two) times daily.  . busPIRone (BUSPAR) 30 MG tablet Take 30 mg by mouth 2 (two) times daily.  . folic acid (FOLVITE) 1 MG tablet Take 1 mg by mouth daily.  . metoprolol tartrate (LOPRESSOR) 25 MG tablet Take 1 tablet (25 mg total) by mouth 2 (two) times daily.  . montelukast (SINGULAIR) 10 MG tablet Take 1 tablet (10  mg total) by mouth at bedtime.  Marland Kitchen terazosin (HYTRIN) 2 MG capsule Take 1 capsule (2 mg total) by mouth daily.  . ticagrelor (BRILINTA) 90 MG TABS tablet Take by mouth 2 (two) times daily.  . [DISCONTINUED] guaiFENesin (MUCINEX) 600 MG 12 hr tablet Take 1 tablet (600 mg total) by mouth 2 (two) times daily.  . [DISCONTINUED] metoprolol tartrate (LOPRESSOR) 25 MG tablet Take 25 mg by mouth 2 (two) times daily.  . [DISCONTINUED] montelukast (SINGULAIR) 10 MG tablet Take 1 tablet (10 mg total) by mouth at bedtime.  . [DISCONTINUED] nicotine (NICODERM CQ - DOSED IN MG/24 HOURS) 14 mg/24hr patch Place 1 patch (14 mg total) onto the skin daily.  . [DISCONTINUED] terazosin (HYTRIN) 2 MG capsule Take 2 mg by mouth daily.  . [DISCONTINUED]  tiZANidine (ZANAFLEX) 4 MG capsule Take 4 mg by mouth 3 (three) times daily.    ROS:  Review of Systems  Constitutional: Negative.   HENT: Negative.   Eyes: Negative.   Respiratory: Negative.   Cardiovascular: Negative.   Gastrointestinal: Negative.   Genitourinary: Negative.   Musculoskeletal: Negative.   Skin:       Bruising  Neurological: Negative.   Endo/Heme/Allergies: Negative.   Psychiatric/Behavioral: Negative.   All other systems reviewed and are negative.    Objective:   Today's Vitals: BP 123/82 (BP Location: Right Arm, Patient Position: Sitting, Cuff Size: Normal)   Pulse 90   Temp (!) 97.3 F (36.3 C) (Temporal)   Resp 18   Ht 5\' 8"  (1.727 m)   Wt 184 lb 6.4 oz (83.6 kg)   SpO2 93%   BMI 28.04 kg/m  Vitals with BMI 12/08/2019 11/10/2019 11/10/2019  Height 5\' 8"  - -  Weight 184 lbs 6 oz - -  BMI 28.04 - -  Systolic 123 127 11/12/2019  Diastolic 82 81 95  Pulse 90 72 71     Physical Exam Vitals and nursing note reviewed.  Constitutional:      Appearance: Normal appearance. He is well-developed and well-groomed. He is obese.  HENT:     Head: Normocephalic and atraumatic.     Right Ear: External ear normal.     Left Ear: External ear normal.     Mouth/Throat:     Comments: Mask in place Eyes:     General:        Right eye: No discharge.        Left eye: No discharge.     Conjunctiva/sclera: Conjunctivae normal.  Cardiovascular:     Rate and Rhythm: Normal rate and regular rhythm.     Pulses: Normal pulses.     Heart sounds: Normal heart sounds.  Pulmonary:     Effort: Pulmonary effort is normal.     Breath sounds: No decreased air movement. No decreased breath sounds.  Musculoskeletal:        General: Normal range of motion.     Cervical back: Normal range of motion and neck supple.  Skin:    General: Skin is warm.     Findings: Ecchymosis present.  Neurological:     General: No focal deficit present.     Mental Status: He is alert and oriented  to person, place, and time.  Psychiatric:        Attention and Perception: Attention normal.        Mood and Affect: Mood normal.        Speech: Speech normal.        Behavior: Behavior normal.  Behavior is cooperative.        Thought Content: Thought content normal.        Cognition and Memory: Cognition normal.        Judgment: Judgment normal.     Assessment   1. Chronic obstructive pulmonary disease, unspecified COPD type (HCC)   2. Coronary artery disease involving native coronary artery of native heart without angina pectoris   3. Benign prostatic hyperplasia with weak urinary stream   4. Essential hypertension   5. Encounter for screening for malignant neoplasm of colon     Tests ordered Orders Placed This Encounter  Procedures  . Ambulatory referral to Cardiology  . Ambulatory referral to Gastroenterology  . Ambulatory referral to Pulmonology     Plan: Please see assessment and plan per problem list above.   Meds ordered this encounter  Medications  . metoprolol tartrate (LOPRESSOR) 25 MG tablet    Sig: Take 1 tablet (25 mg total) by mouth 2 (two) times daily.    Dispense:  30 tablet    Refill:  0    Order Specific Question:   Supervising Provider    Answer:   SIMPSON, MARGARET E [[id number]]  . montelukast (SINGULAIR) 10 MG tablet    Sig: Take 1 tablet (10 mg total) by mouth at bedtime.    Dispense:  30 tablet    Refill:  0    Order Specific Question:   Supervising Provider    Answer:   SIMPSON, MARGARET E [[id number]]  . terazosin (HYTRIN) 2 MG capsule    Sig: Take 1 capsule (2 mg total) by mouth daily.    Dispense:  30 capsule    Refill:  0    Order Specific Question:   Supervising Provider    Answer:   SIMPSON, MARGARET E [[id number]]  . tamsulosin (FLOMAX) 0.4 MG CAPS capsule    Sig: Take 1 capsule (0.4 mg total) by mouth daily.    Dispense:  30 capsule    Refill:  3    Order Specific Question:   Supervising Provider    Answer:   Genia Harold     Patient to follow-up in 01/19/2020 Freddy Finner, NP

## 2019-12-08 NOTE — Assessment & Plan Note (Signed)
He has had several inches of his colon removed.  Referral to GI to make sure that he is doing okay with this in addition to need of colonoscopy.

## 2019-12-08 NOTE — Patient Instructions (Addendum)
I appreciate the opportunity to provide you with care for your health and wellness. Today we discussed: established care   Follow up: 6-8 weeks   No labs  Referrals today: pulmonologist, cardiologist, and GI dr  GREAT TO MEET YOU TODAY!   At next visit we will look at anxiety medication changes.  KEEP UP THE GOOD WORK WITH NOT SMOKING!   Please continue to practice social distancing to keep you, your family, and our community safe.  If you must go out, please wear a mask and practice good handwashing.  It was a pleasure to see you and I look forward to continuing to work together on your health and well-being. Please do not hesitate to call the office if you need care or have questions about your care.  Have a wonderful day and week. With Gratitude, Tereasa Coop, DNP, AGNP-BC

## 2019-12-08 NOTE — Assessment & Plan Note (Signed)
Flomax restarted.  Possible need for urology referral he reports he is not been told that he has prostate cancer just a enlarged prostate which he has had a procedure for.  He reports he still has a weak stream at times.  And gets up several times at night.

## 2019-12-09 ENCOUNTER — Encounter (INDEPENDENT_AMBULATORY_CARE_PROVIDER_SITE_OTHER): Payer: Self-pay | Admitting: *Deleted

## 2019-12-15 ENCOUNTER — Other Ambulatory Visit: Payer: Self-pay | Admitting: Family Medicine

## 2019-12-15 ENCOUNTER — Telehealth: Payer: Self-pay

## 2019-12-15 DIAGNOSIS — I252 Old myocardial infarction: Secondary | ICD-10-CM

## 2019-12-15 DIAGNOSIS — I2510 Atherosclerotic heart disease of native coronary artery without angina pectoris: Secondary | ICD-10-CM

## 2019-12-15 MED ORDER — TICAGRELOR 90 MG PO TABS: 90.0000 mg | ORAL_TABLET | Freq: Two times a day (BID) | ORAL | 1 refills | Status: DC

## 2019-12-15 NOTE — Telephone Encounter (Signed)
Patients sister called (patient was on speaker as well). She stated you increased the metoprolol to twice daily. Feels you made a mistake. Would  Like to know why this was increased. Also, you didn't refill Brilinta 90mg . Patient wants it sent to [facility] please.

## 2019-12-15 NOTE — Telephone Encounter (Signed)
I have refilled what was documented in the chart.  You for this is related to what was on his medication bottles or what was started in the hospital possibly.  I discussed with him in detail that it might be better to put him on a different type of blood pressure medication however I would like the cardiologist to see him first.  He has a cardiologist appointment September 10.  Additionally we discussed his need of possibly being able to come off of Brilinta versus stay on Brilinta and therefore I did not refill as I did know how soon he would get into the cardiologist to see if that needed to be continued.  I am happy to refill it until he is able to get to his appointment.  Please ask if he was taking the Lopressor differently as usually it is a twice daily medication. I will also go back and reviewed the hospital note to see if there are any changes at that time.

## 2019-12-15 NOTE — Telephone Encounter (Signed)
That is not a problem.  The cardiologist can talk to them if they want to adjust it fully.  And how he is feeling.   I will fill the Brilinta at this time.

## 2019-12-15 NOTE — Telephone Encounter (Signed)
Please refill Brilinta. Patient would like for you to know he will only take one metoprolol to stay within his comfort zone

## 2019-12-15 NOTE — Telephone Encounter (Signed)
Patient seems to be feeling fine. He was also on the call. His sister had both of them on speaker phone.

## 2019-12-30 ENCOUNTER — Ambulatory Visit: Payer: Medicaid Other | Admitting: Internal Medicine

## 2020-01-19 ENCOUNTER — Other Ambulatory Visit: Payer: Self-pay

## 2020-01-19 ENCOUNTER — Encounter: Payer: Self-pay | Admitting: Family Medicine

## 2020-01-19 ENCOUNTER — Telehealth (INDEPENDENT_AMBULATORY_CARE_PROVIDER_SITE_OTHER): Payer: Medicaid Other | Admitting: Family Medicine

## 2020-01-19 VITALS — BP 123/82 | Ht 68.0 in | Wt 184.0 lb

## 2020-01-19 DIAGNOSIS — F321 Major depressive disorder, single episode, moderate: Secondary | ICD-10-CM | POA: Diagnosis not present

## 2020-01-19 DIAGNOSIS — R3912 Poor urinary stream: Secondary | ICD-10-CM

## 2020-01-19 DIAGNOSIS — J449 Chronic obstructive pulmonary disease, unspecified: Secondary | ICD-10-CM

## 2020-01-19 DIAGNOSIS — N401 Enlarged prostate with lower urinary tract symptoms: Secondary | ICD-10-CM

## 2020-01-19 DIAGNOSIS — F419 Anxiety disorder, unspecified: Secondary | ICD-10-CM | POA: Diagnosis not present

## 2020-01-19 DIAGNOSIS — I10 Essential (primary) hypertension: Secondary | ICD-10-CM

## 2020-01-19 DIAGNOSIS — I252 Old myocardial infarction: Secondary | ICD-10-CM

## 2020-01-19 DIAGNOSIS — I2510 Atherosclerotic heart disease of native coronary artery without angina pectoris: Secondary | ICD-10-CM

## 2020-01-19 HISTORY — DX: Major depressive disorder, single episode, moderate: F32.1

## 2020-01-19 HISTORY — DX: Anxiety disorder, unspecified: F41.9

## 2020-01-19 HISTORY — DX: Old myocardial infarction: I25.2

## 2020-01-19 MED ORDER — TAMSULOSIN HCL 0.4 MG PO CAPS: 0.4000 mg | ORAL_CAPSULE | Freq: Every day | ORAL | 1 refills | Status: DC

## 2020-01-19 MED ORDER — TICAGRELOR 90 MG PO TABS: 90.0000 mg | ORAL_TABLET | Freq: Two times a day (BID) | ORAL | 1 refills | Status: DC

## 2020-01-19 MED ORDER — METOPROLOL TARTRATE 25 MG PO TABS: 25.0000 mg | ORAL_TABLET | Freq: Two times a day (BID) | ORAL | 0 refills | Status: DC

## 2020-01-19 MED ORDER — MONTELUKAST SODIUM 10 MG PO TABS: 10.0000 mg | ORAL_TABLET | Freq: Every day | ORAL | 1 refills | Status: DC

## 2020-01-19 MED ORDER — BUSPIRONE HCL 10 MG PO TABS: 10.0000 mg | ORAL_TABLET | Freq: Three times a day (TID) | ORAL | 1 refills | Status: DC

## 2020-01-19 MED ORDER — ALBUTEROL SULFATE HFA 108 (90 BASE) MCG/ACT IN AERS: 1.0000 | INHALATION_SPRAY | Freq: Four times a day (QID) | RESPIRATORY_TRACT | 1 refills | Status: DC | PRN

## 2020-01-19 MED ORDER — BUPROPION HCL ER (XL) 150 MG PO TB24: 150.0000 mg | ORAL_TABLET | Freq: Every day | ORAL | 1 refills | Status: DC

## 2020-01-19 MED ORDER — TERAZOSIN HCL 2 MG PO CAPS: 2.0000 mg | ORAL_CAPSULE | Freq: Every day | ORAL | 0 refills | Status: DC

## 2020-01-19 NOTE — Assessment & Plan Note (Signed)
Encouraged to maintain a DASH diet.  Exercise as tolerated. As previously discussed he is on Lopressor not at favorite for monotherapy.  Given unsure if he has any form of heart failure we will wait for cardiology assessment.

## 2020-01-19 NOTE — Assessment & Plan Note (Signed)
Is on Brilinta needs refill on this.  I am hoping cardiology will review this with him and see if this is still okay for him to be on.

## 2020-01-19 NOTE — Assessment & Plan Note (Signed)
GAD is 6.  Denies having any SI or HI.  Will start BuSpar 3 times daily.  In conjunction with Wellbutrin.  Possible need for therapy in the near future if these do not help.  Will not prescribe benzos at this time.

## 2020-01-19 NOTE — Assessment & Plan Note (Signed)
Declined referral for urology at this time.  Continue his Flomax at this time.

## 2020-01-19 NOTE — Progress Notes (Signed)
Virtual Visit via Telephone Note   This visit type was conducted due to national recommendations for restrictions regarding the COVID-19 Pandemic (e.g. social distancing) in an effort to limit this patient's exposure and mitigate transmission in our community.  Due to his co-morbid illnesses, this patient is at least at moderate risk for complications without adequate follow up.  This format is felt to be most appropriate for this patient at this time.  The patient did not have access to video technology/had technical difficulties with video requiring transitioning to audio format only (telephone).  All issues noted in this document were discussed and addressed.  No physical exam could be performed with this format.    Evaluation Performed:  Follow-up visit  Date:  01/19/2020   ID:  Charles Weeks, DOB Aug 29, 1965, MRN [id number]  Patient Location: Home Provider Location: Office/Clinic  Location of Patient: Home Location of Provider: Telehealth Consent was obtain for visit to be over via telehealth. I verified that I am speaking with the correct person using two identifiers.  PCP:  Freddy Finner, NP   Chief Complaint: Chronic conditions  History of Present Illness:    Charles Weeks is a 54 y.o. male with significant history for COPD, CAD, history of MI, hypertension, oxygen 3 L as needed at home, anxiety, arthritis, BPH, depression.  Presents today for follow-up on chronic conditions.  Needs refills on several of his medications.  Additionally is going to be seeing both pulmonology and cardiology next week.  And then will have GI specialty coming up as well.  His biggest concern today is that he is feeling depressed and anxious continuously.  He was originally on BuSpar 30 mg twice daily this was not very helpful for him.  I did not refill his medication at his last visit as I was hoping that once he settled and was breathing better that his anxiety might improve.  Today his GAD and  PHQ are elevated.  He is willing to have me do an adjustment of medications to see if we can get this better controlled.  He is unable to identify why he is stressed, when he gets stressed.  Or how to control it.  He denies having any chest pain, fevers, chills.  Reports still having a cough at times.  And shortness of breath when he moves around frequently.  Possible need for continuous oxygenation.  Is still not smoking he has not smoked since he went to the hospital back in June.  Overall reports that he is doing really well with that.  The patient does not have symptoms concerning for COVID-19 infection (fever, chills, cough, or new shortness of breath).   Past Medical, Surgical, Social History, Allergies, and Medications have been Reviewed.  Past Medical History:  Diagnosis Date  . Acute respiratory failure with hypoxia (HCC) 11/04/2019  . Anxiety   . Arthritis   . COPD (chronic obstructive pulmonary disease) (HCC)   . COPD with acute exacerbation (HCC) 11/04/2019  . Coronary artery disease   . Depression   . Emphysema of lung (HCC)   . Hypertension   . Myocardial infarct (HCC)   . Oxygen deficiency    History reviewed. No pertinent surgical history.   Current Meds  Medication Sig  . albuterol (PROVENTIL) (2.5 MG/3ML) 0.083% nebulizer solution Take 3 mLs (2.5 mg total) by nebulization every 6 (six) hours as needed for wheezing or shortness of breath.  Marland Kitchen albuterol (VENTOLIN HFA) 108 (90 Base) MCG/ACT inhaler Inhale  1-2 puffs into the lungs every 6 (six) hours as needed for wheezing or shortness of breath.  . budesonide-formoterol (SYMBICORT) 80-4.5 MCG/ACT inhaler Inhale 2 puffs into the lungs 2 (two) times daily.  . folic acid (FOLVITE) 1 MG tablet Take 1 mg by mouth daily.  . metoprolol tartrate (LOPRESSOR) 25 MG tablet Take 1 tablet (25 mg total) by mouth 2 (two) times daily.  . montelukast (SINGULAIR) 10 MG tablet Take 1 tablet (10 mg total) by mouth at bedtime.  . tamsulosin  (FLOMAX) 0.4 MG CAPS capsule Take 1 capsule (0.4 mg total) by mouth daily.  Marland Kitchen terazosin (HYTRIN) 2 MG capsule Take 1 capsule (2 mg total) by mouth daily.  . ticagrelor (BRILINTA) 90 MG TABS tablet Take 1 tablet (90 mg total) by mouth 2 (two) times daily.  . [DISCONTINUED] albuterol (VENTOLIN HFA) 108 (90 Base) MCG/ACT inhaler Inhale 1-2 puffs into the lungs every 6 (six) hours as needed for wheezing or shortness of breath.  . [DISCONTINUED] busPIRone (BUSPAR) 30 MG tablet Take 30 mg by mouth 2 (two) times daily.  . [DISCONTINUED] metoprolol tartrate (LOPRESSOR) 25 MG tablet Take 1 tablet (25 mg total) by mouth 2 (two) times daily.  . [DISCONTINUED] montelukast (SINGULAIR) 10 MG tablet Take 1 tablet (10 mg total) by mouth at bedtime.  . [DISCONTINUED] tamsulosin (FLOMAX) 0.4 MG CAPS capsule Take 1 capsule (0.4 mg total) by mouth daily.  . [DISCONTINUED] terazosin (HYTRIN) 2 MG capsule Take 1 capsule (2 mg total) by mouth daily.  . [DISCONTINUED] ticagrelor (BRILINTA) 90 MG TABS tablet Take 1 tablet (90 mg total) by mouth 2 (two) times daily.     Allergies:   Patient has no known allergies.   ROS:   Please see the history of present illness.    All other systems reviewed and are negative.   Labs/Other Tests and Data Reviewed:    Recent Labs: 11/04/2019: ALT 23; B Natriuretic Peptide 43.0 11/05/2019: Magnesium 2.2 11/07/2019: BUN 24; Creatinine, Ser 0.80; Hemoglobin 16.7; Platelets 265; Potassium 4.6; Sodium 138   Recent Lipid Panel Lab Results  Component Value Date/Time   CHOL 158 11/07/2019 04:46 AM   TRIG 160 (H) 11/07/2019 04:46 AM   HDL 45 11/07/2019 04:46 AM   CHOLHDL 3.5 11/07/2019 04:46 AM   LDLCALC 81 11/07/2019 04:46 AM    Wt Readings from Last 3 Encounters:  01/19/20 184 lb (83.5 kg)  12/08/19 184 lb 6.4 oz (83.6 kg)  11/04/19 180 lb (81.6 kg)     Objective:    Vital Signs:  BP 123/82   Ht 5\' 8"  (1.727 m)   Wt 184 lb (83.5 kg)   BMI 27.98 kg/m    VITAL SIGNS:   reviewed GEN:  alert and oriented RESPIRATORY:  No shortness of breath noted in conversation PSYCH:  Flat affect depressed mood  Depression screen [facility] 2/9 01/19/2020 12/08/2019  Decreased Interest 2 0  Down, Depressed, Hopeless 3 3  PHQ - 2 Score 5 3  Altered sleeping 2 0  Tired, decreased energy 2 3  Change in appetite 1 1  Feeling bad or failure about yourself  1 1  Trouble concentrating 2 1  Moving slowly or fidgety/restless 2 1  Suicidal thoughts 0 0  PHQ-9 Score 15 10  Difficult doing work/chores Very difficult Somewhat difficult   GAD 7 : Generalized Anxiety Score 01/19/2020 12/08/2019  Nervous, Anxious, on Edge 1 1  Control/stop worrying 1 0  Worry too much - different things 1 0  Trouble relaxing 1 1  Restless 1 1  Easily annoyed or irritable 1 0  Afraid - awful might happen 0 0  Total GAD 7 Score 6 3  Anxiety Difficulty Somewhat difficult Somewhat difficult   ASSESSMENT & PLAN:    1. Depression, major, single episode, moderate (HCC)  - buPROPion (WELLBUTRIN XL) 150 MG 24 hr tablet; Take 1 tablet (150 mg total) by mouth daily.  Dispense: 30 tablet; Refill: 1 - busPIRone (BUSPAR) 10 MG tablet; Take 1 tablet (10 mg total) by mouth 3 (three) times daily.  Dispense: 90 tablet; Refill: 1  2. Chronic obstructive pulmonary disease, unspecified COPD type (HCC) - montelukast (SINGULAIR) 10 MG tablet; Take 1 tablet (10 mg total) by mouth at bedtime.  Dispense: 90 tablet; Refill: 1 - albuterol (VENTOLIN HFA) 108 (90 Base) MCG/ACT inhaler; Inhale 1-2 puffs into the lungs every 6 (six) hours as needed for wheezing or shortness of breath.  Dispense: 18 g; Refill: 1  3. Anxiety - busPIRone (BUSPAR) 10 MG tablet; Take 1 tablet (10 mg total) by mouth 3 (three) times daily.  Dispense: 90 tablet; Refill: 1  4. Benign prostatic hyperplasia with weak urinary stream  - tamsulosin (FLOMAX) 0.4 MG CAPS capsule; Take 1 capsule (0.4 mg total) by mouth daily.  Dispense: 90 capsule; Refill: 1 -  terazosin (HYTRIN) 2 MG capsule; Take 1 capsule (2 mg total) by mouth daily.  Dispense: 30 capsule; Refill: 0  5. Coronary artery disease involving native coronary artery of native heart without angina pectoris  - metoprolol tartrate (LOPRESSOR) 25 MG tablet; Take 1 tablet (25 mg total) by mouth 2 (two) times daily.  Dispense: 30 tablet; Refill: 0  6. Essential hypertension  - metoprolol tartrate (LOPRESSOR) 25 MG tablet; Take 1 tablet (25 mg total) by mouth 2 (two) times daily.  Dispense: 30 tablet; Refill: 0  7. History of MI (myocardial infarction)  - ticagrelor (BRILINTA) 90 MG TABS tablet; Take 1 tablet (90 mg total) by mouth 2 (two) times daily.  Dispense: 60 tablet; Refill: 1   Time:   Today, I have spent 15 minutes with the patient with telehealth technology discussing the above problems.     Medication Adjustments/Labs and Tests Ordered: Current medicines are reviewed at length with the patient today.  Concerns regarding medicines are outlined above.   Tests Ordered: No orders of the defined types were placed in this encounter.   Medication Changes: Meds ordered this encounter  Medications  . tamsulosin (FLOMAX) 0.4 MG CAPS capsule    Sig: Take 1 capsule (0.4 mg total) by mouth daily.    Dispense:  90 capsule    Refill:  1    Order Specific Question:   Supervising Provider    Answer:   SIMPSON, MARGARET E [[id number]]  . terazosin (HYTRIN) 2 MG capsule    Sig: Take 1 capsule (2 mg total) by mouth daily.    Dispense:  30 capsule    Refill:  0    Order Specific Question:   Supervising Provider    Answer:   SIMPSON, MARGARET E [[id number]]  . montelukast (SINGULAIR) 10 MG tablet    Sig: Take 1 tablet (10 mg total) by mouth at bedtime.    Dispense:  90 tablet    Refill:  1    Order Specific Question:   Supervising Provider    Answer:   SIMPSON, MARGARET E [[id number]]  . metoprolol tartrate (LOPRESSOR) 25 MG tablet    Sig: Take 1 tablet (25  mg total) by mouth 2 (two) times daily.      Dispense:  30 tablet    Refill:  0    Order Specific Question:   Supervising Provider    Answer:   SIMPSON, MARGARET E [[id number]]  . ticagrelor (BRILINTA) 90 MG TABS tablet    Sig: Take 1 tablet (90 mg total) by mouth 2 (two) times daily.    Dispense:  60 tablet    Refill:  1    Order Specific Question:   Supervising Provider    Answer:   SIMPSON, MARGARET E [[id number]]  . albuterol (VENTOLIN HFA) 108 (90 Base) MCG/ACT inhaler    Sig: Inhale 1-2 puffs into the lungs every 6 (six) hours as needed for wheezing or shortness of breath.    Dispense:  18 g    Refill:  1    Order Specific Question:   Supervising Provider    Answer:   SIMPSON, MARGARET E [[id number]]  . buPROPion (WELLBUTRIN XL) 150 MG 24 hr tablet    Sig: Take 1 tablet (150 mg total) by mouth daily.    Dispense:  30 tablet    Refill:  1    Order Specific Question:   Supervising Provider    Answer:   SIMPSON, MARGARET E [[id number]]  . busPIRone (BUSPAR) 10 MG tablet    Sig: Take 1 tablet (10 mg total) by mouth 3 (three) times daily.    Dispense:  90 tablet    Refill:  1    Order Specific Question:   Supervising Provider    Answer:   Syliva Overman E [[id number]]    Disposition:  Follow up 6-8 weeks  Signed, Freddy Finner, NP  01/19/2020 2:10 PM     [facility]

## 2020-01-19 NOTE — Assessment & Plan Note (Signed)
PHQ is elevated at 16.  Denies SI or HI.  Will be starting Wellbutrin and BuSpar in conjunction.  Possible need for therapy in the near future.  Will refrain from any controlled substances at this time

## 2020-01-19 NOTE — Patient Instructions (Signed)
I appreciate the opportunity to provide you with care for your health and wellness. Today we discussed: overall health   Follow up: 6-8 weeks for mood   No labs or referrals today  I am starting you on a medication to help with anxiety and depression.  In addition to restarting your BuSpar at a different dose 3 times a day.  Both of these in conjunction hopefully will help with your anxiety.  Please continue to practice social distancing to keep you, your family, and our community safe.  If you must go out, please wear a mask and practice good handwashing.  It was a pleasure to see you and I look forward to continuing to work together on your health and well-being. Please do not hesitate to call the office if you need care or have questions about your care.  Have a wonderful day and week. With Gratitude, Tereasa Coop, DNP, AGNP-BC

## 2020-01-19 NOTE — Assessment & Plan Note (Signed)
Continue all current treatments at this time.  Has follow-up with pulmonology next week.  I have advised him by his pulmonologist that he will see he might change his medications around to hopefully help him not have as much shortness of breath.  I am wondering if he needs continuous oxygen as he reports getting short of breath when he is moving around.  Currently only using it as needed at night.

## 2020-01-24 ENCOUNTER — Ambulatory Visit: Payer: Medicaid Other | Admitting: Internal Medicine

## 2020-01-24 ENCOUNTER — Encounter: Payer: Self-pay | Admitting: Internal Medicine

## 2020-01-24 ENCOUNTER — Other Ambulatory Visit: Payer: Self-pay

## 2020-01-24 DIAGNOSIS — J9611 Chronic respiratory failure with hypoxia: Secondary | ICD-10-CM | POA: Insufficient documentation

## 2020-01-24 DIAGNOSIS — F17210 Nicotine dependence, cigarettes, uncomplicated: Secondary | ICD-10-CM

## 2020-01-24 DIAGNOSIS — J449 Chronic obstructive pulmonary disease, unspecified: Secondary | ICD-10-CM

## 2020-01-24 HISTORY — DX: Nicotine dependence, cigarettes, uncomplicated: F17.210

## 2020-01-24 HISTORY — DX: Chronic respiratory failure with hypoxia: J96.11

## 2020-01-24 MED ORDER — BUDESONIDE-FORMOTEROL FUMARATE 160-4.5 MCG/ACT IN AERO: INHALATION_SPRAY | RESPIRATORY_TRACT | 12 refills | Status: DC

## 2020-01-24 NOTE — Progress Notes (Signed)
Charles Weeks, male    DOB: 06-23-1965, 54 y.o.   MRN: [id number]   Brief patient profile:  41 yowm illiterate though says finished 7th grade with asthma since in his 38s  just quit smoking 10/2019 on disability since last PFT's ? 2017/18 relocated to [facility] summer 2021 and referred to pulmonary clinic in [place]  01/24/2020 by Charles Weeks.  Admit date: 11/04/2019 Discharge date: 11/10/2019  Admitted From: Home Disposition: Home  Recommendations for Outpatient Follow-up:  1. Follow up with PCP in 1-2 weeks 2. Please obtain BMP/CBC in one week  Home Health: Equipment/Devices: Home oxygen at 3 L, nebulizer machine  Discharge Condition: Stable CODE STATUS: Full code Diet recommendation: Heart healthy  Brief/Interim Summary: Charles Weeks a 54 y.o.malewith a history of CAD, MI, COPD.Patient just recently moved to [place] from [place]. He has no medications and has not seen a physician here. He started having shortness of breath about 3 weeks ago which has been increasing. He has been having cough with some purulent sputum production. He had a relative give him some steroids over the past few days, without too much improvement. No other palliating or provoking factors. Denies fevers, chills, nausea, vomiting.  Emergency Department Course: Patient started on BiPAP and gradually weaned to nasal cannula. Initial saturations were in the 80s. Improved with steroids and BiPAP. Remained stable on nasal cannula.  Discharge Diagnoses:  Principal Problem:   Acute respiratory failure with hypoxia (HCC) Active Problems:   Coronary artery disease   COPD with acute exacerbation (HCC)  Acute on chronic hypoxic respiratory failure secondary to acute COPD exacerbation Presented with hypoxia with O2 saturation in the 80s initially requiring NIV then transitioned to 3-4 L  -previously on 3L at home but has not used since Oct 2020 -Continuebrovana and  pulmicort -Continuehypertonic saline nebs -continue duonebs -He was treated with IV steroids which have since been transitioned to prednisone taper -Personally reviewed chest x-ray which showed no lobular pulmonary infiltrates to suggest pneumonia -Maintain O2 saturation greater than 88 to 90% -TOC consulted to assist with PCP and pulmonology referrals -Currently, he can ambulate comfortably on 3 L of oxygen which appears to be near his baseline  -Acute COPD exacerbation Ongoing tobacco use;tobacco cessation counseling at bedside. COVID-19 negative Treated with IV steroids which was subsequently transitioned to prednisone taper Continue management as stated above -Continue Symbicort on discharge -Continue albuterol nebs as needed, provided with nebulizer machine  Hyperglycemia/impaired glucose tolerance Presented with elevated serum glucose Hemoglobin A1c6.3 on 619 1221 Monitored on sliding scale insulin  Coronary artery diseasestatus post PCI/history of MI Denies any anginal symptoms at the time of this exam ContinueBrilinta  Not on statin, obtain lipid panel  Tobacco use disorder Tobacco cessation counseling Nicotine patch  BPH Continue terazosin Monitor urine output   History of Present Illness  01/24/2020  Pulmonary/ 1st office eval/ Charles Weeks / [place] [facility]  Chief Complaint  Patient presents with  . Pulmonary Consult    Referred by Charles Coop, Weeks.  Pt states hx of COPD. Pt c/o increased SOB since Winter 2020. He sometimes gets winded walking room to room at home. He is using albuterol inhaler and neb both 1-2 x per day.   Dyspnea:  [employer] walking / walk from [facility] space (sisters) / no steps but there are times he can't get across the room s sob  Cough: better since quit  Sleep: flat bed/ 1 pillow  - no resp  SABA use:neb  once or  twice daily "can't make it thru the day" and also using 2 different forms of hfa  02  Prn   No obvious day to day or daytime  variability or assoc excess/ purulent sputum or mucus plugs or hemoptysis or cp or chest tightness, subjective wheeze or overt sinus or hb symptoms.   Sleeping  without nocturnal  or early am exacerbation  of respiratory  c/o's or need for noct saba. Also denies any obvious fluctuation of symptoms with weather or environmental changes or other aggravating or alleviating factors except as outlined above   No unusual exposure hx or h/o childhood pna/ asthma or knowledge of premature birth.  Current Allergies, Complete Past Medical History, Past Surgical History, Family History, and Social History were reviewed in [employer] record.  ROS  The following are not active complaints unless bolded Hoarseness, sore throat, dysphagia, dental problems, itching, sneezing,  nasal congestion or discharge of excess mucus or purulent secretions, ear ache,   fever, chills, sweats, unintended wt loss or wt gain, classically pleuritic or exertional cp,  orthopnea pnd or arm/hand swelling  or leg swelling, presyncope, palpitations, abdominal pain, anorexia, nausea, vomiting, diarrhea  or change in bowel habits or change in bladder habits, change in stools or change in urine, dysuria, hematuria,  rash, arthralgias, visual complaints, headache, numbness, weakness or ataxia or problems with walking or coordination,  change in mood or  memory.             Past Medical History:  Diagnosis Date  . Acute respiratory failure with hypoxia (HCC) 11/04/2019  . Anxiety   . Arthritis   . COPD (chronic obstructive pulmonary disease) (HCC)   . COPD with acute exacerbation (HCC) 11/04/2019  . Coronary artery disease   . Depression   . Emphysema of lung (HCC)   . Hypertension   . Myocardial infarct (HCC)   . Oxygen deficiency     Outpatient Medications Prior to Visit  Medication Sig Dispense Refill  . albuterol (PROVENTIL) (2.5 MG/3ML) 0.083% nebulizer solution Take 3 mLs (2.5 mg total) by  nebulization every 6 (six) hours as needed for wheezing or shortness of breath. 75 mL 12  . albuterol (VENTOLIN HFA) 108 (90 Base) MCG/ACT inhaler Inhale 1-2 puffs into the lungs every 6 (six) hours as needed for wheezing or shortness of breath. 18 g 1  . budesonide-formoterol (SYMBICORT) 80-4.5 MCG/ACT inhaler Inhale 2 puffs into the lungs 2 (two) times daily. 1 Inhaler 12  . buPROPion (WELLBUTRIN XL) 150 MG 24 hr tablet Take 1 tablet (150 mg total) by mouth daily. 30 tablet 1  . busPIRone (BUSPAR) 10 MG tablet Take 1 tablet (10 mg total) by mouth 3 (three) times daily. 90 tablet 1  . metoprolol tartrate (LOPRESSOR) 25 MG tablet Take 1 tablet (25 mg total) by mouth 2 (two) times daily. 30 tablet 0  . montelukast (SINGULAIR) 10 MG tablet Take 1 tablet (10 mg total) by mouth at bedtime. 90 tablet 1  . tamsulosin (FLOMAX) 0.4 MG CAPS capsule Take 1 capsule (0.4 mg total) by mouth daily. 90 capsule 1  . terazosin (HYTRIN) 2 MG capsule Take 1 capsule (2 mg total) by mouth daily. 30 capsule 0  . ticagrelor (BRILINTA) 90 MG TABS tablet Take 1 tablet (90 mg total) by mouth 2 (two) times daily. 60 tablet 1  . folic acid (FOLVITE) 1 MG tablet Take 1 mg by mouth daily.     No facility-administered medications prior to visit.  Objective:     BP 110/64 (BP Location: Left Arm, Cuff Size: Normal)   Pulse 74   Temp (!) 97.1 F (36.2 C) (Temporal)   Ht 5\' 8"  (1.727 m)   Wt 187 lb (84.8 kg)   SpO2 96% Comment: on RA  BMI 28.43 kg/m   SpO2: 96 % (on RA)  Mild obese amb wm /? slowed mentation    HEENT : pt wearing mask not removed for exam due to covid - 19 concerns.    NECK :  without JVD/Nodes/TM/ nl carotid upstrokes bilaterally   LUNGS: no acc muscle use,  Mild barrel  contour chest wall with bilateral  Distant bs s audible wheeze and  without cough on insp or exp maneuvers  and mild  Hyperresonant  to  percussion bilaterally     CV:  RRR  no s3 or murmur or increase in P2, and no  edema   ABD:  soft and nontender with pos end  insp Hoover's  in the supine position. No bruits or organomegaly appreciated, bowel sounds nl  MS:   Nl gait/  ext warm without deformities, calf tenderness, cyanosis or clubbing No obvious joint restrictions   SKIN: warm and dry without lesions    NEURO:  alert, approp, nl sensorium with  no motor or cerebellar deficits apparent.         I personally reviewed images and agree with radiology impression as follows:  CXR:   Portable  11/04/19  COPD without acute abnormality.  Labs ordered 01/24/2020  :  allergy profile   alpha one AT phenotype        Assessment   No problem-specific Assessment & Plan notes found for this encounter.     03/25/2020, MD 01/24/2020

## 2020-01-24 NOTE — Assessment & Plan Note (Signed)
Stopped regular smoking 10/2019 but still "now and then" as of 01/24/2020   Counseled re importance of smoking cessation but did not meet time criteria for separate billing            Each maintenance medication was reviewed in detail including emphasizing most importantly the difference between maintenance and prns and under what circumstances the prns are to be triggered using an action plan format where appropriate.  Total time for H and P, chart review, counseling, teaching hfa  Device/  directly observing portions of ambulatory 02 saturation study/  and generating customized AVS unique to this office visit / charting = 60 min

## 2020-01-24 NOTE — Assessment & Plan Note (Signed)
Has home 02 but not using as of 01/24/2020  -  01/24/2020   Walked RA  approx   200 ft  @ slow pace  stopped due to  Dizzy with sats still 95%     As of 01/24/2020  Advised: Make sure you check your oxygen saturations at highest level of activity to be sure it stays over 90% and adjust 02 flow  upward to maintain this level if needed but remember to turn it back to previous settings when you stop (to conserve your supply).

## 2020-01-24 NOTE — Assessment & Plan Note (Addendum)
Quit smoking 10/2019  - Labs ordered 01/24/2020  :  allergy profile   alpha one AT phenotype   - 01/24/2020  After extensive coaching inhaler device,  effectiveness =    75% (short Ti) > change symb 80 to 160 2bid   - Labs ordered 01/24/2020  :  allergy profile   alpha one AT phenotype  s    Group D in terms of symptom/risk and laba/lama/ICS  therefore appropriate rx at this point >>>  Needs breztri but already has difficulty voiding so best bet is symb 160 2bid and perhaps add half dose spiriva smi when returns for pfts in 6m

## 2020-01-24 NOTE — Patient Instructions (Addendum)
Plan A = Automatic = Always=    Symbicort 160 Take 2 puffs first thing in am and then another 2 puffs about 12 hours later.   Work on inhaler technique:  relax and gently blow all the way out then take a nice smooth deep breath back in, triggering the inhaler at same time you start breathing in.  Hold for up to 5 seconds if you can. Blow out thru nose. Rinse and gargle with water when done       Plan B = Backup (to supplement plan A, not to replace it) Only use your albuterol inhaler as a rescue medication to be used if you can't catch your breath by resting or doing a relaxed purse lip breathing pattern.  - The less you use it, the better it will work when you need it. - Ok to use the inhaler up to 2 puffs  every 4 hours if you must but call for appointment if use goes up over your usual need - Don't leave home without it !!  (think of it like the spare tire for your car)   Plan C = Crisis (instead of Plan B but only if Plan B stops working) - only use your albuterol nebulizer if you first try Plan B and it fails to help > ok to use the nebulizer up to every 4 hours but if start needing it regularly call for immediate appointment  Make sure you check your oxygen saturations at highest level of activity to be sure it stays over 90% and adjust upward to maintain this level if needed but remember to turn it back to previous settings when you stop (to conserve your supply).   Please schedule a follow up visit in 3 months with pfts on return

## 2020-01-27 ENCOUNTER — Other Ambulatory Visit: Payer: Self-pay

## 2020-01-27 ENCOUNTER — Ambulatory Visit (INDEPENDENT_AMBULATORY_CARE_PROVIDER_SITE_OTHER): Payer: Medicaid Other | Admitting: Cardiology

## 2020-01-27 ENCOUNTER — Encounter: Payer: Self-pay | Admitting: Cardiology

## 2020-01-27 VITALS — BP 110/70 | HR 60 | Ht 68.0 in | Wt 186.0 lb

## 2020-01-27 DIAGNOSIS — Z720 Tobacco use: Secondary | ICD-10-CM | POA: Diagnosis not present

## 2020-01-27 DIAGNOSIS — I10 Essential (primary) hypertension: Secondary | ICD-10-CM | POA: Diagnosis not present

## 2020-01-27 DIAGNOSIS — I25119 Atherosclerotic heart disease of native coronary artery with unspecified angina pectoris: Secondary | ICD-10-CM

## 2020-01-27 MED ORDER — NITROGLYCERIN 0.4 MG SL SUBL
0.4000 mg | SUBLINGUAL_TABLET | SUBLINGUAL | 3 refills | Status: DC | PRN
Start: 2020-01-27 — End: 2020-07-13

## 2020-01-27 MED ORDER — ASPIRIN EC 81 MG PO TBEC
81.0000 mg | DELAYED_RELEASE_TABLET | Freq: Every day | ORAL | 3 refills | Status: DC
Start: 2020-01-27 — End: 2020-12-14

## 2020-01-27 MED ORDER — ROSUVASTATIN CALCIUM 5 MG PO TABS
5.0000 mg | ORAL_TABLET | Freq: Every day | ORAL | 3 refills | Status: DC
Start: 2020-01-27 — End: 2020-06-01

## 2020-01-27 NOTE — Patient Instructions (Addendum)
Medication Instructions:  After you finish your current bottle of Brilinta, STOP.  THEN, take Aspirin 81 mg enteric coated daily.  START Crestor 5 mg daily at dinner    *If you need a refill on your cardiac medications before your next appointment, please call your pharmacy*   Lab Work: None today If you have labs (blood work) drawn today and your tests are completely normal, you will receive your results only by: Marland Kitchen MyChart Message (if you have MyChart) OR . A paper copy in the mail If you have any lab test that is abnormal or we need to change your treatment, we will call you to review the results.   Testing/Procedures: Your physician has requested that you have an echocardiogram. Echocardiography is a painless test that uses sound waves to create images of your heart. It provides your doctor with information about the size and shape of your heart and how well your heart's chambers and valves are working. This procedure takes approximately one hour. There are no restrictions for this procedure.     Follow-Up: At [facility], you and your health needs are our priority.  As part of our continuing mission to provide you with exceptional heart care, we have created designated Provider Care Teams.  These Care Teams include your primary Cardiologist (physician) and Advanced Practice Providers (APPs -  Physician Assistants and Nurse Practitioners) who all work together to provide you with the care you need, when you need it.  We recommend signing up for the patient portal called "MyChart".  Sign up information is provided on this After Visit Summary.  MyChart is used to connect with patients for Virtual Visits (Telemedicine).  Patients are able to view lab/test results, encounter notes, upcoming appointments, etc.  Non-urgent messages can be sent to your provider as well.   To learn more about what you can do with MyChart, go to [email or web address].    Your next appointment:   6  month(s)  The format for your next appointment:   In Person  Provider:   Nona Dell, MD   Other InstructionsNitroglycerin sublingual tablets What is this medicine? NITROGLYCERIN (nye troe GLI ser in) is a type of vasodilator. It relaxes blood vessels, increasing the blood and oxygen supply to your heart. This medicine is used to relieve chest pain caused by angina. It is also used to prevent chest pain before activities like climbing stairs, going outdoors in cold weather, or sexual activity. This medicine may be used for other purposes; ask your health care provider or pharmacist if you have questions. COMMON BRAND NAME(S): Nitroquick, Nitrostat, Nitrotab What should I tell my health care provider before I take this medicine? They need to know if you have any of these conditions:  anemia  head injury, recent stroke, or bleeding in the brain  liver disease  previous heart attack  an unusual or allergic reaction to nitroglycerin, other medicines, foods, dyes, or preservatives  pregnant or trying to get pregnant  breast-feeding How should I use this medicine? Take this medicine by mouth as needed. At the first sign of an angina attack (chest pain or tightness) place one tablet under your tongue. You can also take this medicine 5 to 10 minutes before an event likely to produce chest pain. Follow the directions on the prescription label. Let the tablet dissolve under the tongue. Do not swallow whole. Replace the dose if you accidentally swallow it. It will help if your mouth is not dry. Saliva around the tablet  will help it to dissolve more quickly. Do not eat or drink, smoke or chew tobacco while a tablet is dissolving. If you are not better within 5 minutes after taking ONE dose of nitroglycerin, call 9-1-1 immediately to seek emergency medical care. Do not take more than 3 nitroglycerin tablets over 15 minutes. If you take this medicine often to relieve symptoms of angina, your  doctor or health care professional may provide you with different instructions to manage your symptoms. If symptoms do not go away after following these instructions, it is important to call 9-1-1 immediately. Do not take more than 3 nitroglycerin tablets over 15 minutes. Talk to your pediatrician regarding the use of this medicine in children. Special care may be needed. Overdosage: If you think you have taken too much of this medicine contact a poison control center or emergency room at once. NOTE: This medicine is only for you. Do not share this medicine with others. What if I miss a dose? This does not apply. This medicine is only used as needed. What may interact with this medicine? Do not take this medicine with any of the following medications:  certain migraine medicines like ergotamine and dihydroergotamine (DHE)  medicines used to treat erectile dysfunction like sildenafil, tadalafil, and vardenafil  riociguat This medicine may also interact with the following medications:  alteplase  aspirin  heparin  medicines for high blood pressure  medicines for mental depression  other medicines used to treat angina  phenothiazines like chlorpromazine, mesoridazine, prochlorperazine, thioridazine This list may not describe all possible interactions. Give your health care provider a list of all the medicines, herbs, non-prescription drugs, or dietary supplements you use. Also tell them if you smoke, drink alcohol, or use illegal drugs. Some items may interact with your medicine. What should I watch for while using this medicine? Tell your doctor or health care professional if you feel your medicine is no longer working. Keep this medicine with you at all times. Sit or lie down when you take your medicine to prevent falling if you feel dizzy or faint after using it. Try to remain calm. This will help you to feel better faster. If you feel dizzy, take several deep breaths and lie down  with your feet propped up, or bend forward with your head resting between your knees. You may get drowsy or dizzy. Do not drive, use machinery, or do anything that needs mental alertness until you know how this drug affects you. Do not stand or sit up quickly, especially if you are an older patient. This reduces the risk of dizzy or fainting spells. Alcohol can make you more drowsy and dizzy. Avoid alcoholic drinks. Do not treat yourself for coughs, colds, or pain while you are taking this medicine without asking your doctor or health care professional for advice. Some ingredients may increase your blood pressure. What side effects may I notice from receiving this medicine? Side effects that you should report to your doctor or health care professional as soon as possible:  blurred vision  dry mouth  skin rash  sweating  the feeling of extreme pressure in the head  unusually weak or tired Side effects that usually do not require medical attention (report to your doctor or health care professional if they continue or are bothersome):  flushing of the face or neck  headache  irregular heartbeat, palpitations  nausea, vomiting This list may not describe all possible side effects. Call your doctor for medical advice about side effects.  You may report side effects to FDA at 1-800-FDA-1088. Where should I keep my medicine? Keep out of the reach of children. Store at room temperature between 20 and 25 degrees C (68 and 77 degrees F). Store in Retail buyer. Protect from light and moisture. Keep tightly closed. Throw away any unused medicine after the expiration date. NOTE: This sheet is a summary. It may not cover all possible information. If you have questions about this medicine, talk to your doctor, pharmacist, or health care provider.  2020 Elsevier/Gold Standard (2013-03-03 17:57:36)         Thank you for choosing [facility] !

## 2020-01-27 NOTE — Progress Notes (Signed)
Cardiology Office Note  Date: 01/27/2020   ID: Charles Weeks, DOB 01/28/1966, MRN [id number]  PCP:  Freddy Finner, NP  Cardiologist:  Nona Dell, MD Electrophysiologist:  None   Chief Complaint  Patient presents with  . History of CAD    History of Present Illness: Charles Weeks is a 54 y.o. male referred for cardiology consultation by Ms. Arvilla Market NP to establish follow-up of CAD.  I have no records regarding details of his cardiac history at this time, but he did have a stent card indicating DES to the RCA at a facility in [place] back in January 2020, reportedly in the setting of an infarct.  He presents today reporting no recent cardiology follow-up, relocated to this area to be close to family earlier in the summer.  He does not describe any accelerating angina symptoms, does not have nitroglycerin.  I personally reviewed his ECG today which shows normal sinus rhythm with poor R wave progression.  I went over his medications.  He has been on Brilinta since PCI in January of last year.  Not taking aspirin concurrently.  Also not on statin therapy.  Last LDL was 81.  He was just evaluated by Dr. Sherene Sires in the Pulmonary clinic, I reviewed the note.  Past Medical History:  Diagnosis Date  . Anxiety   . Arthritis   . COPD (chronic obstructive pulmonary disease) (HCC)   . Coronary artery disease 06/08/2018   DES to RCA, [facility] [place] [place])  . Depression   . Essential hypertension   . Myocardial infarct (HCC)   . Respiratory failure with hypoxia [facility])     Past Surgical History:  Procedure Laterality Date  . BOWEL RESECTION  2006  . SKIN GRAFT    . TONSILLECTOMY AND ADENOIDECTOMY      Current Outpatient Medications  Medication Sig Dispense Refill  . albuterol (PROVENTIL) (2.5 MG/3ML) 0.083% nebulizer solution Take 3 mLs (2.5 mg total) by nebulization every 6 (six) hours as needed for wheezing or shortness of breath. 75 mL 12  .  albuterol (VENTOLIN HFA) 108 (90 Base) MCG/ACT inhaler Inhale 1-2 puffs into the lungs every 6 (six) hours as needed for wheezing or shortness of breath. 18 g 1  . budesonide-formoterol (SYMBICORT) 160-4.5 MCG/ACT inhaler Take 2 puffs first thing in am and then another 2 puffs about 12 hours later. 1 each 12  . buPROPion (WELLBUTRIN XL) 150 MG 24 hr tablet Take 1 tablet (150 mg total) by mouth daily. 30 tablet 1  . busPIRone (BUSPAR) 10 MG tablet Take 1 tablet (10 mg total) by mouth 3 (three) times daily. 90 tablet 1  . metoprolol tartrate (LOPRESSOR) 25 MG tablet Take 1 tablet (25 mg total) by mouth 2 (two) times daily. 30 tablet 0  . montelukast (SINGULAIR) 10 MG tablet Take 1 tablet (10 mg total) by mouth at bedtime. 90 tablet 1  . tamsulosin (FLOMAX) 0.4 MG CAPS capsule Take 1 capsule (0.4 mg total) by mouth daily. 90 capsule 1  . terazosin (HYTRIN) 2 MG capsule Take 1 capsule (2 mg total) by mouth daily. 30 capsule 0  . ticagrelor (BRILINTA) 90 MG TABS tablet Take 1 tablet (90 mg total) by mouth 2 (two) times daily. 60 tablet 1  . aspirin EC 81 MG tablet Take 1 tablet (81 mg total) by mouth daily. Swallow whole. 90 tablet 3  . nitroGLYCERIN (NITROSTAT) 0.4 MG SL tablet Place 1 tablet (0.4 mg total) under the tongue  every 5 (five) minutes as needed for chest pain. 25 tablet 3  . rosuvastatin (CRESTOR) 5 MG tablet Take 1 tablet (5 mg total) by mouth daily. 90 tablet 3   No current facility-administered medications for this visit.   Allergies:  Patient has no known allergies.   Social History: The patient  reports that he quit smoking about 2 months ago. His smoking use included cigarettes. He has a 55.50 pack-year smoking history. He has never used smokeless tobacco. He reports that he does not drink alcohol and does not use drugs.   Family History: The patient's family history includes Diabetes in his sister; Heart disease in his father and mother.   ROS:  No palpitations or  syncope.  Physical Exam: VS:  BP 110/70 (BP Location: Right Arm)   Pulse 60   Ht 5\' 8"  (1.727 m)   Wt 186 lb (84.4 kg)   SpO2 97%   BMI 28.28 kg/m , BMI Body mass index is 28.28 kg/m.  Wt Readings from Last 3 Encounters:  01/27/20 186 lb (84.4 kg)  01/24/20 187 lb (84.8 kg)  01/19/20 184 lb (83.5 kg)    General: Patient appears comfortable at rest. HEENT: Conjunctiva and lids normal, wearing a mask. Neck: Supple, no elevated JVP or carotid bruits, no thyromegaly. Lungs: Clear to auscultation, nonlabored breathing at rest. Cardiac: Regular rate and rhythm, no S3 or significant systolic murmur, no pericardial rub. Abdomen: Soft, nontender, bowel sounds present. Extremities: No pitting edema, distal pulses 2+. Skin: Warm and dry. Musculoskeletal: No kyphosis. Neuropsychiatric: Alert and oriented x3, affect grossly appropriate.  ECG:  An ECG dated 11/04/2019 was personally reviewed today and demonstrated:  Sinus tachycardia, possible left atrial enlargement, decreased R wave progression, nonspecific ST changes.  Recent Labwork: 11/04/2019: ALT 23; AST 17; B Natriuretic Peptide 43.0 11/05/2019: Magnesium 2.2 11/07/2019: BUN 24; Creatinine, Ser 0.80; Hemoglobin 16.7; Platelets 265; Potassium 4.6; Sodium 138     Component Value Date/Time   CHOL 158 11/07/2019 0446   TRIG 160 (H) 11/07/2019 0446   HDL 45 11/07/2019 0446   CHOLHDL 3.5 11/07/2019 0446   VLDL 32 11/07/2019 0446   LDLCALC 81 11/07/2019 0446    Other Studies Reviewed Today:  Chest x-ray 11/04/2019: FINDINGS: The heart size and mediastinal contours are within normal limits. Both lungs are clear but hyperinflated. The visualized skeletal structures show healing rib fractures on the right.  IMPRESSION: COPD without acute abnormality.  Healing right rib fractures.  Assessment and Plan:  1.  CAD with history of DES to the RCA in setting of reported infarct back in January 2020 at a facility in February 2020.  Information gleaned from stent card with the patient, no other records are currently available.  He has been on Brilinta for over a year and a half.  We discussed stopping Brilinta once he completes the current bottle and initiating aspirin 81 mg daily going forward.  Continue Lopressor, start Crestor 5 mg daily, prescription also provided for as needed nitroglycerin.  We will obtain an echocardiogram to quantify LVEF.  2.  COPD with tobacco abuse.  Smoking cessation discussed.  3.  History of essential hypertension, blood pressure is normal today.  Medication Adjustments/Labs and Tests Ordered: Current medicines are reviewed at length with the patient today.  Concerns regarding medicines are outlined above.   Tests Ordered: Orders Placed This Encounter  Procedures  . EKG 12-Lead  . ECHOCARDIOGRAM COMPLETE    Medication Changes: Meds ordered this encounter  Medications  . nitroGLYCERIN (NITROSTAT) 0.4 MG SL tablet    Sig: Place 1 tablet (0.4 mg total) under the tongue every 5 (five) minutes as needed for chest pain.    Dispense:  25 tablet    Refill:  3  . rosuvastatin (CRESTOR) 5 MG tablet    Sig: Take 1 tablet (5 mg total) by mouth daily.    Dispense:  90 tablet    Refill:  3  . aspirin EC 81 MG tablet    Sig: Take 1 tablet (81 mg total) by mouth daily. Swallow whole.    Dispense:  90 tablet    Refill:  3    01/27/20 to begin after he stops Brilinta    Disposition:  Follow up 6 months in the [place] office.  Signed, Jonelle Sidle, MD, [facility] 01/27/2020 4:01 PM    [facility] at [facility] [address] [place], [place], [place] [address] Phone: [phone]; Fax: [phone]

## 2020-01-31 ENCOUNTER — Ambulatory Visit (HOSPITAL_COMMUNITY)
Admission: RE | Admit: 2020-01-31 | Discharge: 2020-01-31 | Disposition: A | Discharge status: Home / Self Care | Payer: Medicaid Other | Source: Ambulatory Visit | Attending: Family Medicine | Admitting: Family Medicine

## 2020-01-31 ENCOUNTER — Other Ambulatory Visit: Payer: Self-pay

## 2020-01-31 DIAGNOSIS — I25119 Atherosclerotic heart disease of native coronary artery with unspecified angina pectoris: Secondary | ICD-10-CM | POA: Insufficient documentation

## 2020-01-31 LAB — ECHOCARDIOGRAM COMPLETE
AR max vel: 2.36 cm2
AV Area VTI: 2.47 cm2
AV Area mean vel: 2.32 cm2
AV Mean grad: 3.7 mmHg
AV Peak grad: 7.3 mmHg
Ao pk vel: 1.36 m/s
Area-P 1/2: 2.78 cm2
S' Lateral: 3.06 cm

## 2020-01-31 NOTE — Progress Notes (Signed)
*  PRELIMINARY RESULTS* Echocardiogram 2D Echocardiogram has been performed.  Stacey Drain 01/31/2020, 3:24 PM

## 2020-02-17 ENCOUNTER — Other Ambulatory Visit: Payer: Self-pay | Admitting: Family Medicine

## 2020-02-17 DIAGNOSIS — I10 Essential (primary) hypertension: Secondary | ICD-10-CM

## 2020-02-17 DIAGNOSIS — I2510 Atherosclerotic heart disease of native coronary artery without angina pectoris: Secondary | ICD-10-CM

## 2020-03-01 ENCOUNTER — Ambulatory Visit: Payer: Medicaid Other | Admitting: Family Medicine

## 2020-03-06 ENCOUNTER — Other Ambulatory Visit: Payer: Self-pay

## 2020-03-06 ENCOUNTER — Encounter: Payer: Self-pay | Admitting: Family Medicine

## 2020-03-06 ENCOUNTER — Telehealth (INDEPENDENT_AMBULATORY_CARE_PROVIDER_SITE_OTHER): Payer: Medicaid Other | Admitting: Family Medicine

## 2020-03-06 VITALS — BP 104/70 | Ht 68.0 in | Wt 186.0 lb

## 2020-03-06 DIAGNOSIS — F321 Major depressive disorder, single episode, moderate: Secondary | ICD-10-CM

## 2020-03-06 MED ORDER — BUPROPION HCL ER (XL) 300 MG PO TB24: 300.0000 mg | ORAL_TABLET | Freq: Every day | ORAL | 1 refills | Status: DC

## 2020-03-06 NOTE — Assessment & Plan Note (Signed)
PHQ-9 has reduced by half.  Denies SI or HI.  Increasing Wellbutrin to 300 mg daily.  Continue BuSpar as is.  Declines having therapy at this time as he started to feel better.  Continue refraining from controlled substances.

## 2020-03-06 NOTE — Patient Instructions (Signed)
  HAPPY FALL!  I appreciate the opportunity to provide you with care for your health and wellness. Today we discussed: mood   Follow up: 3 months in office   No labs or referrals today  Increased Wellbutrin to 300 mg daily Continue all other medications as ordered  Glad you are feeling a little better these days.   Please continue to practice social distancing to keep you, your family, and our community safe.  If you must go out, please wear a mask and practice good handwashing.  It was a pleasure to see you and I look forward to continuing to work together on your health and well-being. Please do not hesitate to call the office if you need care or have questions about your care.  Have a wonderful day and week. With Gratitude, Tereasa Coop, DNP, AGNP-BC

## 2020-03-06 NOTE — Progress Notes (Signed)
Virtual Visit via Telephone Note   This visit type was conducted due to national recommendations for restrictions regarding the COVID-19 Pandemic (e.g. social distancing) in an effort to limit this patient's exposure and mitigate transmission in our community.  Due to his co-morbid illnesses, this patient is at least at moderate risk for complications without adequate follow up.  This format is felt to be most appropriate for this patient at this time.  The patient did not have access to video technology/had technical difficulties with video requiring transitioning to audio format only (telephone).  All issues noted in this document were discussed and addressed.  No physical exam could be performed with this format.    Evaluation Performed:  Follow-up visit  Date:  03/06/2020   ID:  Charles Weeks, DOB 10-Jul-1965, MRN [id number]  Patient Location: Home Provider Location: Office/Clinic  Location of Patient: Home Location of Provider: Telehealth Consent was obtain for visit to be over via telehealth. I verified that I am speaking with the correct person using two identifiers.  PCP:  Freddy Finner, NP   Chief Complaint:  Mood follow up   History of Present Illness:    Charles Weeks is a 54 y.o. male with significant history for COPD, CAD, history of MI, hypertension, oxygen 3 L as needed at home, anxiety, arthritis, BPH, depression.  Presents today for follow-up on depression.  At his last visit in the beginning of September we started him on Wellbutrin and BuSpar  He states he does feel a lot better.  He reports that his anxiety is improved he still has depression but it has improved.  He is doing better with the BuSpar spread out in smaller doses.  And today his GAD and PHQ are much improved.  The patient does not have symptoms concerning for COVID-19 infection (fever, chills, cough, or new shortness of breath).   Past Medical, Surgical, Social History, Allergies, and  Medications have been Reviewed.  Past Medical History:  Diagnosis Date  . Anxiety   . Arthritis   . COPD (chronic obstructive pulmonary disease) (HCC)   . Coronary artery disease 06/08/2018   DES to RCA, [facility] [place], [place])  . Depression   . Essential hypertension   . Myocardial infarct (HCC)   . Respiratory failure with hypoxia [facility])    Past Surgical History:  Procedure Laterality Date  . BOWEL RESECTION  2006  . SKIN GRAFT    . TONSILLECTOMY AND ADENOIDECTOMY       Current Meds  Medication Sig  . albuterol (PROVENTIL) (2.5 MG/3ML) 0.083% nebulizer solution Take 3 mLs (2.5 mg total) by nebulization every 6 (six) hours as needed for wheezing or shortness of breath.  Marland Kitchen albuterol (VENTOLIN HFA) 108 (90 Base) MCG/ACT inhaler Inhale 1-2 puffs into the lungs every 6 (six) hours as needed for wheezing or shortness of breath.  Marland Kitchen aspirin EC 81 MG tablet Take 1 tablet (81 mg total) by mouth daily. Swallow whole.  . budesonide-formoterol (SYMBICORT) 160-4.5 MCG/ACT inhaler Take 2 puffs first thing in am and then another 2 puffs about 12 hours later.  Marland Kitchen buPROPion (WELLBUTRIN XL) 150 MG 24 hr tablet Take 1 tablet (150 mg total) by mouth daily.  . busPIRone (BUSPAR) 10 MG tablet Take 1 tablet (10 mg total) by mouth 3 (three) times daily.  . metoprolol tartrate (LOPRESSOR) 25 MG tablet Take 1 tablet (25 mg total) by mouth 2 (two) times daily.  . montelukast (SINGULAIR) 10 MG tablet  Take 1 tablet (10 mg total) by mouth at bedtime.  . nitroGLYCERIN (NITROSTAT) 0.4 MG SL tablet Place 1 tablet (0.4 mg total) under the tongue every 5 (five) minutes as needed for chest pain.  . rosuvastatin (CRESTOR) 5 MG tablet Take 1 tablet (5 mg total) by mouth daily.  . tamsulosin (FLOMAX) 0.4 MG CAPS capsule Take 1 capsule (0.4 mg total) by mouth daily.  Marland Kitchen terazosin (HYTRIN) 2 MG capsule Take 1 capsule (2 mg total) by mouth daily.  . ticagrelor (BRILINTA) 90 MG TABS tablet Take 1 tablet (90 mg  total) by mouth 2 (two) times daily.     Allergies:   Patient has no known allergies.   ROS:   Please see the history of present illness.    All other systems reviewed and are negative.   Labs/Other Tests and Data Reviewed:    Recent Labs: 11/04/2019: ALT 23; B Natriuretic Peptide 43.0 11/05/2019: Magnesium 2.2 11/07/2019: BUN 24; Creatinine, Ser 0.80; Hemoglobin 16.7; Platelets 265; Potassium 4.6; Sodium 138   Recent Lipid Panel Lab Results  Component Value Date/Time   CHOL 158 11/07/2019 04:46 AM   TRIG 160 (H) 11/07/2019 04:46 AM   HDL 45 11/07/2019 04:46 AM   CHOLHDL 3.5 11/07/2019 04:46 AM   LDLCALC 81 11/07/2019 04:46 AM    Wt Readings from Last 3 Encounters:  03/06/20 186 lb (84.4 kg)  01/27/20 186 lb (84.4 kg)  01/24/20 187 lb (84.8 kg)     Objective:    Vital Signs:  BP 104/70   Ht 5\' 8"  (1.727 m)   Wt 186 lb (84.4 kg)   BMI 28.28 kg/m    VITAL SIGNS:  reviewed GEN:  no acute distress RESPIRATORY:  no shortness of breath in conversation PSYCH:  normal affect  Depression screen [facility] 2/9 03/06/2020 01/19/2020 12/08/2019  Decreased Interest 1 2 0  Down, Depressed, Hopeless 1 3 3   PHQ - 2 Score 2 5 3   Altered sleeping 0 2 0  Tired, decreased energy 3 2 3   Change in appetite 0 1 1  Feeling bad or failure about yourself  1 1 1   Trouble concentrating 1 2 1   Moving slowly or fidgety/restless 1 2 1   Suicidal thoughts 0 0 0  PHQ-9 Score 8 15 10   Difficult doing work/chores Not difficult at all Very difficult Somewhat difficult   GAD 7 : Generalized Anxiety Score 03/06/2020 01/19/2020 12/08/2019  Nervous, Anxious, on Edge 0 1 1  Control/stop worrying 1 1 0  Worry too much - different things 1 1 0  Trouble relaxing 3 1 1   Restless 1 1 1   Easily annoyed or irritable - 1 0  Afraid - awful might happen 1 0 0  Total GAD 7 Score - 6 3  Anxiety Difficulty Not difficult at all Somewhat difficult Somewhat difficult    ASSESSMENT & PLAN:    1. Depression, major,  single episode, moderate (HCC)  - buPROPion (WELLBUTRIN XL) 300 MG 24 hr tablet; Take 1 tablet (300 mg total) by mouth daily.  Dispense: 90 tablet; Refill: 1   Time:   Today, I have spent 5 minutes with the patient with telehealth technology discussing the above problems.     Medication Adjustments/Labs and Tests Ordered: Current medicines are reviewed at length with the patient today.  Concerns regarding medicines are outlined above.   Tests Ordered: No orders of the defined types were placed in this encounter.   Medication Changes: No orders of the defined types  were placed in this encounter.    Note: This dictation was prepared with Dragon dictation along with smaller phrase technology. Similar sounding words can be transcribed inadequately or may not be corrected upon review. Any transcriptional errors that result from this process are unintentional.      Disposition:  Follow up 3-4 months  Signed, Freddy Finner, NP  03/06/2020 2:14 PM     [facility]

## 2020-03-07 ENCOUNTER — Other Ambulatory Visit: Payer: Self-pay | Admitting: Family Medicine

## 2020-03-07 DIAGNOSIS — N401 Enlarged prostate with lower urinary tract symptoms: Secondary | ICD-10-CM

## 2020-03-22 ENCOUNTER — Other Ambulatory Visit: Payer: Self-pay

## 2020-03-22 ENCOUNTER — Telehealth: Payer: Self-pay | Admitting: Family Medicine

## 2020-03-22 DIAGNOSIS — I2510 Atherosclerotic heart disease of native coronary artery without angina pectoris: Secondary | ICD-10-CM

## 2020-03-22 DIAGNOSIS — F321 Major depressive disorder, single episode, moderate: Secondary | ICD-10-CM

## 2020-03-22 DIAGNOSIS — I10 Essential (primary) hypertension: Secondary | ICD-10-CM

## 2020-03-22 DIAGNOSIS — R3912 Poor urinary stream: Secondary | ICD-10-CM

## 2020-03-22 DIAGNOSIS — F419 Anxiety disorder, unspecified: Secondary | ICD-10-CM

## 2020-03-22 DIAGNOSIS — N401 Enlarged prostate with lower urinary tract symptoms: Secondary | ICD-10-CM

## 2020-03-22 MED ORDER — BUSPIRONE HCL 10 MG PO TABS: 10.0000 mg | ORAL_TABLET | Freq: Three times a day (TID) | ORAL | 1 refills | Status: DC

## 2020-03-22 MED ORDER — METOPROLOL TARTRATE 25 MG PO TABS: 25.0000 mg | ORAL_TABLET | Freq: Two times a day (BID) | ORAL | 0 refills | Status: DC

## 2020-03-22 MED ORDER — TERAZOSIN HCL 2 MG PO CAPS: 2.0000 mg | ORAL_CAPSULE | Freq: Every day | ORAL | 0 refills | Status: DC

## 2020-03-22 NOTE — Telephone Encounter (Signed)
Medications sent in by [facility], LPN.

## 2020-03-22 NOTE — Telephone Encounter (Signed)
Patient needing urgent refills sent to [facility] sister states this is the 3rd request and that this is continuity of care and neglect the medications are metoprolol, buspirone, and terazosin p# [phone]

## 2020-04-13 ENCOUNTER — Other Ambulatory Visit (HOSPITAL_COMMUNITY): Payer: Medicaid Other

## 2020-04-16 ENCOUNTER — Encounter: Payer: Self-pay | Admitting: Nurse Practitioner

## 2020-04-16 ENCOUNTER — Other Ambulatory Visit (HOSPITAL_COMMUNITY): Payer: Medicaid Other

## 2020-04-16 ENCOUNTER — Telehealth (INDEPENDENT_AMBULATORY_CARE_PROVIDER_SITE_OTHER): Payer: Medicaid Other | Admitting: Nurse Practitioner

## 2020-04-16 ENCOUNTER — Other Ambulatory Visit: Payer: Self-pay

## 2020-04-16 DIAGNOSIS — J441 Chronic obstructive pulmonary disease with (acute) exacerbation: Secondary | ICD-10-CM

## 2020-04-16 MED ORDER — LEVOFLOXACIN 500 MG PO TABS
500.0000 mg | ORAL_TABLET | Freq: Every day | ORAL | 0 refills | Status: DC
Start: 2020-04-16 — End: 2020-05-31

## 2020-04-16 MED ORDER — MUCINEX DM MAXIMUM STRENGTH 60-1200 MG PO TB12: 1.0000 | ORAL_TABLET | Freq: Two times a day (BID) | ORAL | 1 refills | Status: AC

## 2020-04-16 NOTE — Progress Notes (Signed)
Acute Office Visit  Subjective:    Patient ID: Charles Weeks, male    DOB: 1965/11/05, 54 y.o.   MRN: [id number]  Chief Complaint  Patient presents with  . Cough    x 6 days   . Sinusitis    x 6 days     HPI Patient is in today for severe congestion.  He has productive cough that is producing green sputum.  He also has some SOB. Has not tried any home medicines except his breathing treatments.  His home O2 sat readings have been "93-94%" and that is his baseline.  Denies fever  Past Medical History:  Diagnosis Date  . Anxiety   . Anxiety 01/19/2020  . Arthritis   . Benign prostatic hyperplasia with weak urinary stream 12/08/2019  . Chronic respiratory failure with hypoxia (HCC) 01/24/2020   Has home 02 but not using as of 01/24/2020  -  01/24/2020   Walked RA  approx   200 ft  @ slow pace  stopped due to  Dizzy with sats still 95%     . Cigarette smoker 01/24/2020   Stopped regular smoking 10/2019 but still "now and then" as of 01/24/2020   . COPD (chronic obstructive pulmonary disease) (HCC)   . COPD GOLD ?     Quit smoking 10/2019  - Labs ordered 01/24/2020  :  allergy profile   alpha one AT phenotype   - 01/24/2020  After extensive coaching inhaler device,  effectiveness =    75% (short Ti) > change symb 80 to 160 2bid     . Coronary artery disease 06/08/2018   DES to RCA, [facility] [place] [place])  . Depression   . Depression, major, single episode, moderate (HCC) 01/19/2020  . Encounter for screening for malignant neoplasm of colon 12/08/2019  . Essential hypertension   . Essential hypertension 12/08/2019  . History of MI (myocardial infarction) 01/19/2020  . Myocardial infarct (HCC)   . Myocardial infarct (HCC)   . Respiratory failure with hypoxia [facility])     Past Surgical History:  Procedure Laterality Date  . BOWEL RESECTION  2006  . SKIN GRAFT    . TONSILLECTOMY AND ADENOIDECTOMY      Family History  Problem Relation Age of Onset  . Heart disease Mother   . Heart  disease Father   . Diabetes Sister     Social History   Socioeconomic History  . Marital status: Single    Spouse name: Not on file  . Number of children: 2  . Years of education: Not on file  . Highest education level: 7th grade  Occupational History  . Not on file  Tobacco Use  . Smoking status: Former Smoker    Packs/day: 1.50    Years: 37.00    Pack years: 55.50    Types: Cigarettes    Quit date: 11/04/2019    Years since quitting: 0.4  . Smokeless tobacco: Never Used  Vaping Use  . Vaping Use: Never used  Substance and Sexual Activity  . Alcohol use: Never  . Drug use: Never  . Sexual activity: Not on file  Other Topics Concern  . Not on file  Social History Narrative   Lives with sister    Both daughters live close by       Sister has dog       Enjoys: shooting pool, fishing, Therapist, nutritional      Diet: eats all food groups   Caffeine: pop-dr pepper  2 liter    Water: 4-6 cups       Wears seat belt    No driving for him   Smoke Lexicographer   No weapons           Social Determinants of Health   Financial Resource Strain: Low Risk   . Difficulty of Paying Living Expenses: Not hard at all  Food Insecurity: No Food Insecurity  . Worried About Programme researcher, broadcasting/film/video in the Last Year: Never true  . Ran Out of Food in the Last Year: Never true  Transportation Needs: No Transportation Needs  . Lack of Transportation (Medical): No  . Lack of Transportation (Non-Medical): No  Physical Activity: Inactive  . Days of Exercise per Week: 0 days  . Minutes of Exercise per Session: 0 min  Stress: No Stress Concern Present  . Feeling of Stress : Not at all  Social Connections: Socially Isolated  . Frequency of Communication with Friends and Family: More than three times a week  . Frequency of Social Gatherings with Friends and Family: More than three times a week  . Attends Religious Services: Never  . Active Member of Clubs or Organizations: No  .  Attends Banker Meetings: Never  . Marital Status: Divorced  Catering manager Violence: Not At Risk  . Fear of Current or Ex-Partner: No  . Emotionally Abused: No  . Physically Abused: No  . Sexually Abused: No    Outpatient Medications Prior to Visit  Medication Sig Dispense Refill  . albuterol (PROVENTIL) (2.5 MG/3ML) 0.083% nebulizer solution Take 3 mLs (2.5 mg total) by nebulization every 6 (six) hours as needed for wheezing or shortness of breath. 75 mL 12  . albuterol (VENTOLIN HFA) 108 (90 Base) MCG/ACT inhaler Inhale 1-2 puffs into the lungs every 6 (six) hours as needed for wheezing or shortness of breath. 18 g 1  . aspirin EC 81 MG tablet Take 1 tablet (81 mg total) by mouth daily. Swallow whole. 90 tablet 3  . budesonide-formoterol (SYMBICORT) 160-4.5 MCG/ACT inhaler Take 2 puffs first thing in am and then another 2 puffs about 12 hours later. 1 each 12  . buPROPion (WELLBUTRIN XL) 300 MG 24 hr tablet Take 1 tablet (300 mg total) by mouth daily. 90 tablet 1  . busPIRone (BUSPAR) 10 MG tablet Take 1 tablet (10 mg total) by mouth 3 (three) times daily. 90 tablet 1  . metoprolol tartrate (LOPRESSOR) 25 MG tablet Take 1 tablet (25 mg total) by mouth 2 (two) times daily. 30 tablet 0  . montelukast (SINGULAIR) 10 MG tablet Take 1 tablet (10 mg total) by mouth at bedtime. 90 tablet 1  . nitroGLYCERIN (NITROSTAT) 0.4 MG SL tablet Place 1 tablet (0.4 mg total) under the tongue every 5 (five) minutes as needed for chest pain. 25 tablet 3  . rosuvastatin (CRESTOR) 5 MG tablet Take 1 tablet (5 mg total) by mouth daily. 90 tablet 3  . tamsulosin (FLOMAX) 0.4 MG CAPS capsule Take 1 capsule (0.4 mg total) by mouth daily. 90 capsule 1  . terazosin (HYTRIN) 2 MG capsule Take 1 capsule (2 mg total) by mouth daily. 30 capsule 0  . ticagrelor (BRILINTA) 90 MG TABS tablet Take 1 tablet (90 mg total) by mouth 2 (two) times daily. 60 tablet 1   No facility-administered medications prior to  visit.    No Known Allergies  Review of Systems  HENT: Positive for rhinorrhea. Negative for  sinus pressure, sinus pain and sore throat.   Respiratory: Positive for cough, chest tightness, shortness of breath and wheezing.   Cardiovascular: Negative.        Objective:    Physical Exam  There were no vitals taken for this visit. Wt Readings from Last 3 Encounters:  03/06/20 186 lb (84.4 kg)  01/27/20 186 lb (84.4 kg)  01/24/20 187 lb (84.8 kg)    Health Maintenance Due  Topic Date Due  . Hepatitis C Screening  Never done  . TETANUS/TDAP  Never done  . COLONOSCOPY  Never done    There are no preventive care reminders to display for this patient.   No results found for: TSH Lab Results  Component Value Date   WBC 13.8 (H) 11/07/2019   HGB 16.7 11/07/2019   HCT 49.8 11/07/2019   MCV 97.5 11/07/2019   PLT 265 11/07/2019   Lab Results  Component Value Date   NA 138 11/07/2019   K 4.6 11/07/2019   CO2 29 11/07/2019   GLUCOSE 148 (H) 11/07/2019   BUN 24 (H) 11/07/2019   CREATININE 0.80 11/07/2019   BILITOT 1.1 11/04/2019   ALKPHOS 84 11/04/2019   AST 17 11/04/2019   ALT 23 11/04/2019   PROT 7.4 11/04/2019   ALBUMIN 4.4 11/04/2019   CALCIUM 9.1 11/07/2019   ANIONGAP 9 11/07/2019   Lab Results  Component Value Date   CHOL 158 11/07/2019   Lab Results  Component Value Date   HDL 45 11/07/2019   Lab Results  Component Value Date   LDLCALC 81 11/07/2019   Lab Results  Component Value Date   TRIG 160 (H) 11/07/2019   Lab Results  Component Value Date   CHOLHDL 3.5 11/07/2019   Lab Results  Component Value Date   HGBA1C 6.3 (H) 11/05/2019       Assessment & Plan:   Problem List Items Addressed This Visit      Respiratory   COPD with acute exacerbation (HCC) - Primary    -pt is oxygenating well currently, but states he feels SOB d/t his congestion and sputum production -Rx. Mucinex; we discussed drinking plenty of water with this -Rx.  levaquin      Relevant Medications   Dextromethorphan-guaiFENesin (MUCINEX DM MAXIMUM STRENGTH) 60-1200 MG TB12       Meds ordered this encounter  Medications  . Dextromethorphan-guaiFENesin (MUCINEX DM MAXIMUM STRENGTH) 60-1200 MG TB12    Sig: Take 1 tablet by mouth every 12 (twelve) hours for 7 days.    Dispense:  14 tablet    Refill:  1  . levofloxacin (LEVAQUIN) 500 MG tablet    Sig: Take 1 tablet (500 mg total) by mouth daily.    Dispense:  7 tablet    Refill:  0   Date:  04/16/2020   Location of Patient: Home Location of Provider: Office Consent was obtain for visit to be over via telehealth. I verified that I am speaking with the correct person using two identifiers.  I connected with  [name] on 04/16/20 via telephone and verified that I am speaking with the correct person using two identifiers.   I discussed the limitations of evaluation and management by telemedicine. The patient expressed understanding and agreed to proceed.  Time spent 8 minutes   Heather Roberts, NP

## 2020-04-16 NOTE — Assessment & Plan Note (Signed)
-  pt is oxygenating well currently, but states he feels SOB d/t his congestion and sputum production -Rx. Mucinex; we discussed drinking plenty of water with this -Rx. levaquin

## 2020-04-17 ENCOUNTER — Encounter (HOSPITAL_COMMUNITY): Payer: Medicaid Other

## 2020-04-27 ENCOUNTER — Other Ambulatory Visit (HOSPITAL_COMMUNITY)
Admission: RE | Admit: 2020-04-27 | Discharge: 2020-04-27 | Disposition: A | Discharge status: Home / Self Care | Payer: Medicaid Other | Source: Ambulatory Visit | Attending: Internal Medicine | Admitting: Internal Medicine

## 2020-04-27 ENCOUNTER — Other Ambulatory Visit: Payer: Self-pay

## 2020-04-27 DIAGNOSIS — Z20822 Contact with and (suspected) exposure to covid-19: Secondary | ICD-10-CM | POA: Insufficient documentation

## 2020-04-27 DIAGNOSIS — Z01812 Encounter for preprocedural laboratory examination: Secondary | ICD-10-CM | POA: Insufficient documentation

## 2020-04-27 LAB — SARS CORONAVIRUS 2 (TAT 6-24 HRS): SARS Coronavirus 2: NEGATIVE

## 2020-05-01 ENCOUNTER — Other Ambulatory Visit: Payer: Self-pay

## 2020-05-01 ENCOUNTER — Encounter: Payer: Self-pay | Admitting: *Deleted

## 2020-05-01 ENCOUNTER — Ambulatory Visit (HOSPITAL_COMMUNITY)
Admission: RE | Admit: 2020-05-01 | Discharge: 2020-05-01 | Disposition: A | Discharge status: Home / Self Care | Payer: Medicaid Other | Source: Ambulatory Visit | Attending: Internal Medicine | Admitting: Internal Medicine

## 2020-05-01 DIAGNOSIS — J449 Chronic obstructive pulmonary disease, unspecified: Secondary | ICD-10-CM | POA: Diagnosis present

## 2020-05-01 LAB — PULMONARY FUNCTION TEST
DL/VA % pred: 45 %
DL/VA: 2 ml/min/mmHg/L
DLCO unc % pred: 43 %
DLCO unc: 11.59 ml/min/mmHg
FEF 25-75 Post: 0.46 L/sec
FEF 25-75 Pre: 0.32 L/sec
FEF2575-%Change-Post: 42 %
FEF2575-%Pred-Post: 15 %
FEF2575-%Pred-Pre: 10 %
FEV1-%Change-Post: 1 %
FEV1-%Pred-Post: 27 %
FEV1-%Pred-Pre: 27 %
FEV1-Post: 0.97 L
FEV1-Pre: 0.95 L
FEV1FVC-%Change-Post: -24 %
FEV1FVC-%Pred-Pre: 51 %
FEV6-%Change-Post: 17 %
FEV6-%Pred-Post: 55 %
FEV6-%Pred-Pre: 46 %
FEV6-Post: 2.43 L
FEV6-Pre: 2.06 L
FEV6FVC-%Change-Post: -12 %
FEV6FVC-%Pred-Post: 78 %
FEV6FVC-%Pred-Pre: 89 %
FVC-%Change-Post: 35 %
FVC-%Pred-Post: 70 %
FVC-%Pred-Pre: 52 %
FVC-Post: 3.24 L
FVC-Pre: 2.4 L
Post FEV1/FVC ratio: 30 %
Post FEV6/FVC ratio: 75 %
Pre FEV1/FVC ratio: 40 %
Pre FEV6/FVC Ratio: 86 %
RV % pred: 497 %
RV: 10.1 L
TLC % pred: 199 %
TLC: 13.12 L

## 2020-05-01 MED ORDER — ALBUTEROL SULFATE (2.5 MG/3ML) 0.083% IN NEBU
2.5000 mg | INHALATION_SOLUTION | Freq: Once | RESPIRATORY_TRACT | Status: AC
  Administered 2020-05-01: 2.5 mg via RESPIRATORY_TRACT

## 2020-05-05 ENCOUNTER — Encounter: Payer: Self-pay | Admitting: Internal Medicine

## 2020-05-07 ENCOUNTER — Telehealth: Payer: Self-pay | Admitting: Internal Medicine

## 2020-05-07 NOTE — Telephone Encounter (Signed)
Call patient : Severe copd but there is reversible component and may benefit from a stronger inhaler but would need ov 1st for sample and teaching    I called and spoke with the pt and notified of results/recs per Dr Sherene Sires. Pt verbalized understanding. Appt scheduled in [place] for 05/30/20 at 10:30 am.

## 2020-05-29 ENCOUNTER — Other Ambulatory Visit: Payer: Self-pay

## 2020-05-29 ENCOUNTER — Ambulatory Visit: Payer: Medicaid Other | Admitting: Internal Medicine

## 2020-05-31 ENCOUNTER — Other Ambulatory Visit: Payer: Self-pay

## 2020-05-31 ENCOUNTER — Ambulatory Visit (INDEPENDENT_AMBULATORY_CARE_PROVIDER_SITE_OTHER): Payer: Medicaid Other | Admitting: Internal Medicine

## 2020-05-31 ENCOUNTER — Encounter: Payer: Self-pay | Admitting: Internal Medicine

## 2020-05-31 VITALS — BP 106/70 | HR 80 | Temp 97.1°F | Ht 68.0 in | Wt 194.8 lb

## 2020-05-31 DIAGNOSIS — J449 Chronic obstructive pulmonary disease, unspecified: Secondary | ICD-10-CM

## 2020-05-31 DIAGNOSIS — N401 Enlarged prostate with lower urinary tract symptoms: Secondary | ICD-10-CM

## 2020-05-31 MED ORDER — ALBUTEROL SULFATE HFA 108 (90 BASE) MCG/ACT IN AERS: 1.0000 | INHALATION_SPRAY | RESPIRATORY_TRACT | 11 refills | Status: DC | PRN

## 2020-05-31 MED ORDER — TAMSULOSIN HCL 0.4 MG PO CAPS: 0.4000 mg | ORAL_CAPSULE | Freq: Every day | ORAL | 1 refills | Status: DC

## 2020-05-31 NOTE — Patient Instructions (Addendum)
Plan A = Automatic = Always=    Symbicort 160 Take 2 puffs first thing in am and then another 2 puffs about 12 hours later.   Work on inhaler technique:  relax and gently blow all the way out then take a nice smooth deep breath back in, triggering the inhaler at same time you start breathing in.  Hold for up to 5 seconds if you can. Blow out thru nose. Rinse and gargle with water when done      Plan B = Backup (to supplement plan A, not to replace it) Only use your albuterol inhaler as a rescue medication to be used if you can't catch your breath by resting or doing a relaxed purse lip breathing pattern.  - The less you use it, the better it will work when you need it. - Ok to use the inhaler up to 2 puffs  every 4 hours if you must but call for appointment if use goes up over your usual need - Don't leave home without it !!  (think of it like the spare tire for your car)    Plan C = Crisis (instead of Plan B but only if Plan B stops working) - only use your albuterol nebulizer if you first try Plan B and it fails to help > ok to use the nebulizer up to every 4 hours but if start needing it regularly call for immediate appointment  Make sure you check your oxygen saturations at highest level of activity to be sure it stays over 90% and adjust upward to maintain this level if needed but remember to turn it back to previous settings when you stop (to conserve your supply).   Please remember to go to the lab and x-ray department at [facility]   for your tests - we will call you with the results when they are available.      Please schedule a follow up visit in 3 months with pfts on return

## 2020-05-31 NOTE — Progress Notes (Signed)
Charles Weeks, male    DOB: 1966-04-10, 55 y.o.   MRN: [id number]   Brief patient profile:  7  yowm illiterate though says finished 7th grade with asthma since in his 70s  just quit smoking 10/2019 on disability since last PFT's ? 2017/18 relocated to [facility] summer 2021 and referred to pulmonary clinic in [place]  01/24/2020 by Charles Sheng NP.  Admit date: 11/04/2019 Discharge date: 11/10/2019  Admitted From: Home Disposition: Home  Recommendations for Outpatient Follow-up:  1. Follow up with PCP in 1-2 weeks 2. Please obtain BMP/CBC in one week  Home Health: Equipment/Devices: Home oxygen at 3 L, nebulizer machine  Discharge Condition: Stable CODE STATUS: Full code Diet recommendation: Heart healthy  Brief/Interim Summary: Charles Harrisonis a 55 y.o.malewith a history of CAD, MI, COPD.Patient just recently moved to [place] from [place]. He has no medications and has not seen a physician here. He started having shortness of breath about 3 weeks ago which has been increasing. He has been having cough with some purulent sputum production. He had a relative give him some steroids over the past few days, without too much improvement. No other palliating or provoking factors. Denies fevers, chills, nausea, vomiting.  Emergency Department Course: Patient started on BiPAP and gradually weaned to nasal cannula. Initial saturations were in the 80s. Improved with steroids and BiPAP. Remained stable on nasal cannula.  Discharge Diagnoses:  Principal Problem:   Acute respiratory failure with hypoxia (HCC) Active Problems:   Coronary artery disease   COPD with acute exacerbation (HCC)  Acute on chronic hypoxic respiratory failure secondary to acute COPD exacerbation Presented with hypoxia with O2 saturation in the 80s initially requiring NIV then transitioned to 3-4 L  -previously on 3L at home but has not used since Oct 2020 -Continuebrovana and  pulmicort -Continuehypertonic saline nebs -continue duonebs -He was treated with IV steroids which have since been transitioned to prednisone taper -Personally reviewed chest x-ray which showed no lobular pulmonary infiltrates to suggest pneumonia -Maintain O2 saturation greater than 88 to 90% -TOC consulted to assist with PCP and pulmonology referrals -Currently, he can ambulate comfortably on 3 L of oxygen which appears to be near his baseline  -Acute COPD exacerbation Ongoing tobacco use;tobacco cessation counseling at bedside. COVID-19 negative Treated with IV steroids which was subsequently transitioned to prednisone taper Continue management as stated above -Continue Symbicort on discharge -Continue albuterol nebs as needed, provided with nebulizer machine  Hyperglycemia/impaired glucose tolerance Presented with elevated serum glucose Hemoglobin A1c6.3 on 619 1221 Monitored on sliding scale insulin  Coronary artery diseasestatus post PCI/history of MI Denies any anginal symptoms at the time of this exam ContinueBrilinta  Not on statin, obtain lipid panel  Tobacco use disorder Tobacco cessation counseling Nicotine patch  BPH Continue terazosin Monitor urine output   History of Present Illness  01/24/2020  Pulmonary/ 1st office eval/ Charles Weeks / [place] Office  Chief Complaint  Patient presents with  . Pulmonary Consult    Referred by Charles Coop, NP.  Pt states hx of COPD. Pt c/o increased SOB since Winter 2020. He sometimes gets winded walking room to room at home. He is using albuterol inhaler and neb both 1-2 x per day.   Dyspnea:  [employer] walking / walk from [facility] space (sisters) / no steps but there are times he can't get across the room s sob  Cough: better since quit  Sleep: flat bed/ 1 pillow  - no resp  SABA use:neb  once  or twice daily "can't make it thru the day" and also using 2 different forms of hfa  02  Prn  rec Plan A = Automatic = Always=     Symbicort 160 Take 2 puffs first thing in am and then another 2 puffs about 12 hours later.  Work on inhaler technique: Plan B = Backup (to supplement plan A, not to replace it) Only use your albuterol inhaler as a rescue medication Plan C = Crisis (instead of Plan B but only if Plan B stops working) - only use your albuterol nebulizer if you first try Plan B and it fails to help  Make sure you check your oxygen saturations at highest level of activity  Please schedule a follow up visit in 3 months with pfts on return      05/31/2020  f/u ov/[place] office/Charles Weeks re: needs to go to lab / did stop smoking Chief Complaint  Patient presents with  . Follow-up    Patient feels like breathing is the same since last visit. Cough but states that it is hard to get up mucus.   Dyspnea: basically housebound/ walks dog to porch / 25 ft bd Cough: non productive, last cig jun 2021 much better since then  Sleeping: flat bed/ no bipap/ wakes up feeling find SABA use: rarely neb,  Twice a daily proair never pre challenges or rechallenges  02: has it not using / not tracking sats with ex    No obvious day to day or daytime variability or assoc excess/ purulent sputum or mucus plugs or hemoptysis or cp or chest tightness, subjective wheeze or overt sinus or hb symptoms.   Sleeping most nights  without nocturnal  or early am exacerbation  of respiratory  c/o's or need for noct saba. Also denies any obvious fluctuation of symptoms with weather or environmental changes or other aggravating or alleviating factors except as outlined above   No unusual exposure hx or h/o childhood pna/ asthma or knowledge of premature birth.  Current Allergies, Complete Past Medical History, Past Surgical History, Family History, and Social History were reviewed in [employer] record.  ROS  The following are not active complaints unless bolded Hoarseness, sore throat, dysphagia, dental problems,  itching, sneezing,  nasal congestion or discharge of excess mucus or purulent secretions, ear ache,   fever, chills, sweats, unintended wt loss or wt gain, classically pleuritic or exertional cp,  orthopnea pnd or arm/hand swelling  or leg swelling, presyncope, palpitations, abdominal pain, anorexia, nausea, vomiting, diarrhea  or change in bowel habits or change in bladder habits, change in stools or change in urine, dysuria, hematuria,  rash, arthralgias, visual complaints, headache, numbness, weakness or ataxia or problems with walking or coordination,  change in mood or  memory.        Current Meds  Medication Sig  . albuterol (PROVENTIL) (2.5 MG/3ML) 0.083% nebulizer solution Take 3 mLs (2.5 mg total) by nebulization every 6 (six) hours as needed for wheezing or shortness of breath.  Marland Kitchen albuterol (VENTOLIN HFA) 108 (90 Base) MCG/ACT inhaler Inhale 1-2 puffs into the lungs every 6 (six) hours as needed for wheezing or shortness of breath.  Marland Kitchen aspirin EC 81 MG tablet Take 1 tablet (81 mg total) by mouth daily. Swallow whole.  . budesonide-formoterol (SYMBICORT) 160-4.5 MCG/ACT inhaler Take 2 puffs first thing in am and then another 2 puffs about 12 hours later.  Marland Kitchen buPROPion (WELLBUTRIN XL) 300 MG 24 hr tablet Take 1 tablet (  300 mg total) by mouth daily.  . busPIRone (BUSPAR) 10 MG tablet Take 1 tablet (10 mg total) by mouth 3 (three) times daily.  . metoprolol tartrate (LOPRESSOR) 25 MG tablet TAKE ONE TABLET BY MOUTH 2 TIMES A DAY  . metoprolol tartrate (LOPRESSOR) 25 MG tablet Take 1 tablet (25 mg total) by mouth 2 (two) times daily.  . montelukast (SINGULAIR) 10 MG tablet Take 1 tablet (10 mg total) by mouth at bedtime.  . nitroGLYCERIN (NITROSTAT) 0.4 MG SL tablet Place 1 tablet (0.4 mg total) under the tongue every 5 (five) minutes as needed for chest pain.  . rosuvastatin (CRESTOR) 5 MG tablet Take 1 tablet (5 mg total) by mouth daily.  . tamsulosin (FLOMAX) 0.4 MG CAPS capsule Take 1 capsule  (0.4 mg total) by mouth daily.  Marland Kitchen terazosin (HYTRIN) 2 MG capsule Take 1 capsule (2 mg total) by mouth daily.  . ticagrelor (BRILINTA) 90 MG TABS tablet Take 1 tablet (90 mg total) by mouth 2 (two) times daily.                Past Medical History:  Diagnosis Date  . Acute respiratory failure with hypoxia (HCC) 11/04/2019  . Anxiety   . Arthritis   . COPD (chronic obstructive pulmonary disease) (HCC)   . COPD with acute exacerbation (HCC) 11/04/2019  . Coronary artery disease   . Depression   . Emphysema of lung (HCC)   . Hypertension   . Myocardial infarct (HCC)   . Oxygen deficiency       Objective:     Wt Readings from Last 3 Encounters:  05/31/20 194 lb 12.8 oz (88.4 kg)  03/06/20 186 lb (84.4 kg)  01/27/20 186 lb (84.4 kg)      Vital signs reviewed  05/31/2020  - Note at rest 02 sats  96% on RA   General appearance:    Obese amb wm   HEENT : pt wearing mask not removed for exam due to covid -19 concerns.    NECK :  without JVD/Nodes/TM/ nl carotid upstrokes bilaterally   LUNGS: no acc muscle use,  Mod barrel  contour chest wall with bilateral  Distant bs s audible wheeze and  without cough on insp or exp maneuvers and mod  Hyperresonant  to  percussion bilaterally     CV:  RRR  no s3 or murmur or increase in P2, and no edema   ABD:  soft and nontender with pos mid insp Hoover's  in the supine position. No bruits or organomegaly appreciated, bowel sounds nl  MS:     ext warm without deformities, calf tenderness, cyanosis or clubbing No obvious joint restrictions   SKIN: warm and dry without lesions    NEURO:  alert, approp, nl sensorium with  no motor or cerebellar deficits apparent.        did not go to labs or cxr as req 05/31/2020         Assessment   No problem-specific Assessment & Plan notes found for this encounter.     Sandrea Hughs, MD 01/24/2020

## 2020-06-01 ENCOUNTER — Other Ambulatory Visit: Payer: Self-pay | Admitting: Family Medicine

## 2020-06-01 ENCOUNTER — Other Ambulatory Visit: Payer: Self-pay

## 2020-06-01 ENCOUNTER — Encounter: Payer: Self-pay | Admitting: Internal Medicine

## 2020-06-01 DIAGNOSIS — J449 Chronic obstructive pulmonary disease, unspecified: Secondary | ICD-10-CM

## 2020-06-01 DIAGNOSIS — F419 Anxiety disorder, unspecified: Secondary | ICD-10-CM

## 2020-06-01 DIAGNOSIS — N401 Enlarged prostate with lower urinary tract symptoms: Secondary | ICD-10-CM

## 2020-06-01 DIAGNOSIS — F321 Major depressive disorder, single episode, moderate: Secondary | ICD-10-CM

## 2020-06-01 MED ORDER — ROSUVASTATIN CALCIUM 5 MG PO TABS: 5.0000 mg | ORAL_TABLET | Freq: Every day | ORAL | 3 refills | Status: DC

## 2020-06-01 NOTE — Assessment & Plan Note (Addendum)
Quit smoking 10/2019  - Labs ordered 01/24/2020  :  allergy profile   alpha one AT phenotype   - 01/24/2020  After extensive coaching inhaler device,  effectiveness =    75% (short Ti) > change symb 80 to 160 2bid  -  PFT's   05/01/20   FEV1 0.97 (27 % ) ratio 0.30  p 1 % improvement from saba p symbicort prior to study with DLCO  11.59 (43%) corrects to 2.0 (45%)  for alv volume and FV curve severe airflow obst with 35% improvement in FVC p saba  - 05/31/2020  After extensive coaching inhaler device,  effectiveness =    Short Ti, prostate problems so rx symbicort 160 2bid and prn saba -  05/31/2020   Walked RA  approx   300 ft  @ moderate pace  stopped due to  Sob at end but sats still 94%    Doing reasonably well on just symbiocort as prostate issues preclude lama for now.  Re saba: I spent extra time with pt today reviewing appropriate use of albuterol for prn use on exertion with the following points: 1) saba is for relief of sob that does not improve by walking a slower pace or resting but rather if the pt does not improve after trying this first. 2) If the pt is convinced, as many are, that saba helps recover from activity faster then it's easy to tell if this is the case by re-challenging : ie stop, take the inhaler, then p 5 minutes try the exact same activity (intensity of workload) that just caused the symptoms and see if they are substantially diminished or not after saba 3) if there is an activity that reproducibly causes the symptoms, try the saba 15 min before the activity on alternate days   If in fact the saba really does help, then fine to continue to use it prn but advised may need to look closer at the maintenance regimen being used to achieve better control of airways disease with exertion.           Each maintenance medication was reviewed in detail including emphasizing most importantly the difference between maintenance and prns and under what circumstances the prns are to be  triggered using an action plan format where appropriate.  Total time for H and P, chart review, counseling, reviewing hfa  Device(s)  directly observing portions of ambulatory 02 saturation study/ and generating customized AVS unique to this office visit / same day charting = 37 min

## 2020-07-02 ENCOUNTER — Other Ambulatory Visit: Payer: Self-pay | Admitting: Family Medicine

## 2020-07-02 DIAGNOSIS — N401 Enlarged prostate with lower urinary tract symptoms: Secondary | ICD-10-CM

## 2020-07-02 DIAGNOSIS — F321 Major depressive disorder, single episode, moderate: Secondary | ICD-10-CM

## 2020-07-02 DIAGNOSIS — F419 Anxiety disorder, unspecified: Secondary | ICD-10-CM

## 2020-07-13 ENCOUNTER — Other Ambulatory Visit: Payer: Self-pay

## 2020-07-13 ENCOUNTER — Ambulatory Visit (INDEPENDENT_AMBULATORY_CARE_PROVIDER_SITE_OTHER): Payer: Medicaid Other | Admitting: Cardiology

## 2020-07-13 ENCOUNTER — Encounter: Payer: Self-pay | Admitting: Cardiology

## 2020-07-13 VITALS — BP 98/70 | HR 66 | Ht 68.0 in | Wt 186.0 lb

## 2020-07-13 DIAGNOSIS — I25119 Atherosclerotic heart disease of native coronary artery with unspecified angina pectoris: Secondary | ICD-10-CM

## 2020-07-13 MED ORDER — NITROGLYCERIN 0.4 MG SL SUBL: 0.4000 mg | SUBLINGUAL_TABLET | SUBLINGUAL | 3 refills | Status: DC | PRN

## 2020-07-13 MED ORDER — METOPROLOL SUCCINATE ER 25 MG PO TB24: 12.5000 mg | ORAL_TABLET | Freq: Every day | ORAL | 3 refills | Status: DC

## 2020-07-13 NOTE — Patient Instructions (Signed)
Medication Instructions:  STOP Metoprolol (Lopressor)   START Toprol XL 12.5 mg daily    *If you need a refill on your cardiac medications before your next appointment, please call your pharmacy*   Lab Work: Fasting Lipids  If you have labs (blood work) drawn today and your tests are completely normal, you will receive your results only by: Marland Kitchen MyChart Message (if you have MyChart) OR . A paper copy in the mail If you have any lab test that is abnormal or we need to change your treatment, we will call you to review the results.   Testing/Procedures: None today   Follow-Up: At [facility], you and your health needs are our priority.  As part of our continuing mission to provide you with exceptional heart care, we have created designated Provider Care Teams.  These Care Teams include your primary Cardiologist (physician) and Advanced Practice Providers (APPs -  Physician Assistants and Nurse Practitioners) who all work together to provide you with the care you need, when you need it.  We recommend signing up for the patient portal called "MyChart".  Sign up information is provided on this After Visit Summary.  MyChart is used to connect with patients for Virtual Visits (Telemedicine).  Patients are able to view lab/test results, encounter notes, upcoming appointments, etc.  Non-urgent messages can be sent to your provider as well.   To learn more about what you can do with MyChart, go to [email or web address].    Your next appointment:   6 month(s)  The format for your next appointment:   In Person  Provider:   Nona Dell, MD   Other Instructions None     Thank you for choosing [facility] !

## 2020-07-13 NOTE — Progress Notes (Signed)
Cardiology Office Note  Date: 07/13/2020   ID: Charles Weeks, DOB [birth year], MRN [id number]  PCP:  Freddy Finner, NP  Cardiologist:  Nona Dell, MD Electrophysiologist:  None   Chief Complaint  Patient presents with  . Cardiac follow-up    History of Present Illness: Charles Weeks is a 55 y.o. male last seen in September 2021.  He presents for a routine visit.  Reports no active angina symptoms at this time or increasing nitroglycerin requirement.  We went over his medications.  He had been taking Lopressor 25 mg only once a day, it sounds like he was used to taking Toprol-XL in the past.  Blood pressure tends to run low to low normal, he does not describe any sudden dizziness or syncope.  He did stop Brilinta since we last saw him, now on baby aspirin daily.  Also tolerating low-dose Crestor.  Plan to obtain a follow-up lipid profile.  Past Medical History:  Diagnosis Date  . Anxiety   . Anxiety 01/19/2020  . Arthritis   . Benign prostatic hyperplasia with weak urinary stream 12/08/2019  . Chronic respiratory failure with hypoxia (HCC) 01/24/2020   Has home 02 but not using as of 01/24/2020  -  01/24/2020   Walked RA  approx   200 ft  @ slow pace  stopped due to  Dizzy with sats still 95%     . Cigarette smoker 01/24/2020   Stopped regular smoking 10/2019 but still "now and then" as of 01/24/2020   . COPD (chronic obstructive pulmonary disease) (HCC)   . COPD GOLD ?     Quit smoking 10/2019  - Labs ordered 01/24/2020  :  allergy profile   alpha one AT phenotype   - 01/24/2020  After extensive coaching inhaler device,  effectiveness =    75% (short Ti) > change symb 80 to 160 2bid     . Coronary artery disease 06/08/2018   DES to RCA, [facility] [place] [place])  . Depression   . Depression, major, single episode, moderate (HCC) 01/19/2020  . Encounter for screening for malignant neoplasm of colon 12/08/2019  . Essential hypertension   . Essential hypertension 12/08/2019  .  History of MI (myocardial infarction) 01/19/2020  . Myocardial infarct (HCC)   . Myocardial infarct (HCC)   . Respiratory failure with hypoxia [facility])     Past Surgical History:  Procedure Laterality Date  . BOWEL RESECTION  2006  . SKIN GRAFT    . TONSILLECTOMY AND ADENOIDECTOMY      Current Outpatient Medications  Medication Sig Dispense Refill  . albuterol (PROVENTIL) (2.5 MG/3ML) 0.083% nebulizer solution Take 3 mLs (2.5 mg total) by nebulization every 6 (six) hours as needed for wheezing or shortness of breath. 75 mL 12  . albuterol (VENTOLIN HFA) 108 (90 Base) MCG/ACT inhaler Inhale 1-2 puffs into the lungs every 4 (four) hours as needed for wheezing or shortness of breath. 18 g 11  . aspirin EC 81 MG tablet Take 1 tablet (81 mg total) by mouth daily. Swallow whole. 90 tablet 3  . budesonide-formoterol (SYMBICORT) 160-4.5 MCG/ACT inhaler Take 2 puffs first thing in am and then another 2 puffs about 12 hours later. 1 each 12  . buPROPion (WELLBUTRIN XL) 300 MG 24 hr tablet Take 1 tablet (300 mg total) by mouth daily. 90 tablet 1  . busPIRone (BUSPAR) 10 MG tablet TAKE ONE TABLET BY MOUTH 3 TIMES A DAY 90 tablet 0  . metoprolol  succinate (TOPROL XL) 25 MG 24 hr tablet Take 0.5 tablets (12.5 mg total) by mouth daily. 45 tablet 3  . montelukast (SINGULAIR) 10 MG tablet TAKE ONE TABLET BY MOUTH AT BEDTIME 90 tablet 0  . rosuvastatin (CRESTOR) 5 MG tablet Take 1 tablet (5 mg total) by mouth daily. 90 tablet 3  . tamsulosin (FLOMAX) 0.4 MG CAPS capsule Take 1 capsule (0.4 mg total) by mouth daily. 90 capsule 1  . terazosin (HYTRIN) 2 MG capsule TAKE ONE CAPSULE BY MOUTH ONCE DAILY 30 capsule 0  . nitroGLYCERIN (NITROSTAT) 0.4 MG SL tablet Place 1 tablet (0.4 mg total) under the tongue every 5 (five) minutes as needed for chest pain. 25 tablet 3   No current facility-administered medications for this visit.   Allergies:  Patient has no known allergies.   ROS: No palpitations or  syncope.  Physical Exam: VS:  BP 98/70   Pulse 66   Ht 5\' 8"  (1.727 m)   Wt 186 lb (84.4 kg)   SpO2 95%   BMI 28.28 kg/m , BMI Body mass index is 28.28 kg/m.  Wt Readings from Last 3 Encounters:  07/13/20 186 lb (84.4 kg)  05/31/20 194 lb 12.8 oz (88.4 kg)  03/06/20 186 lb (84.4 kg)    General: Patient appears comfortable at rest. HEENT: Conjunctiva and lids normal, wearing a mask. Neck: Supple, no elevated JVP or carotid bruits, no thyromegaly. Lungs: Clear to auscultation, nonlabored breathing at rest. Cardiac: Regular rate and rhythm, no S3 or significant systolic murmur, no pericardial rub. Extremities: No pitting edema.  ECG:  An ECG dated September 2021 was personally reviewed today and demonstrated:  Sinus rhythm with poor R wave progression.  Recent Labwork: 11/04/2019: ALT 23; AST 17; B Natriuretic Peptide 43.0 11/05/2019: Magnesium 2.2 11/07/2019: BUN 24; Creatinine, Ser 0.80; Hemoglobin 16.7; Platelets 265; Potassium 4.6; Sodium 138     Component Value Date/Time   CHOL 158 11/07/2019 0446   TRIG 160 (H) 11/07/2019 0446   HDL 45 11/07/2019 0446   CHOLHDL 3.5 11/07/2019 0446   VLDL 32 11/07/2019 0446   LDLCALC 81 11/07/2019 0446    Other Studies Reviewed Today:  Echocardiogram 01/31/2020: 1. Left ventricular ejection fraction, by estimation, is 65 to 70%. The  left ventricle has normal function. The left ventricle has no regional  wall motion abnormalities. Left ventricular diastolic parameters were  normal.  2. Right ventricular systolic function is normal. The right ventricular  size is normal.  3. The mitral valve is normal in structure. No evidence of mitral valve  regurgitation. No evidence of mitral stenosis.  4. The aortic valve has an indeterminant number of cusps. Aortic valve  regurgitation is not visualized. No aortic stenosis is present.  5. The inferior vena cava is normal in size with greater than 50%  respiratory variability, suggesting  right atrial pressure of 3 mmHg.   Assessment and Plan:  1.  Post DES to the RCA in January 2020.  He reports no active angina at this time.  Completed Brilinta and now taking aspirin 81 mg daily.  Switch from Lopressor to Toprol-XL 12.5 mg daily, continue Crestor.  He has as needed nitroglycerin available.  Recheck FLP.  2.  Reported history of hypertension, although blood pressure tends to run low to low normal.  Continue observation.  Medication Adjustments/Labs and Tests Ordered: Current medicines are reviewed at length with the patient today.  Concerns regarding medicines are outlined above.   Tests Ordered: Orders Placed This  Encounter  Procedures  . Lipid Profile    Medication Changes: Meds ordered this encounter  Medications  . nitroGLYCERIN (NITROSTAT) 0.4 MG SL tablet    Sig: Place 1 tablet (0.4 mg total) under the tongue every 5 (five) minutes as needed for chest pain.    Dispense:  25 tablet    Refill:  3  . metoprolol succinate (TOPROL XL) 25 MG 24 hr tablet    Sig: Take 0.5 tablets (12.5 mg total) by mouth daily.    Dispense:  45 tablet    Refill:  3    07/13/20 lopressor stopped    Disposition:  Follow up 6 months in the [place] office.  Signed, Jonelle Sidle, MD, [facility] 07/13/2020 3:07 PM    [facility] at [facility] [address], [place], [place] [address] Phone: [phone]; Fax: [phone]

## 2020-07-26 ENCOUNTER — Ambulatory Visit (HOSPITAL_COMMUNITY)
Admission: RE | Admit: 2020-07-26 | Discharge: 2020-07-26 | Disposition: A | Discharge status: Home / Self Care | Payer: Medicaid Other | Source: Ambulatory Visit | Attending: Internal Medicine | Admitting: Internal Medicine

## 2020-07-26 ENCOUNTER — Other Ambulatory Visit: Payer: Self-pay

## 2020-07-26 DIAGNOSIS — J449 Chronic obstructive pulmonary disease, unspecified: Secondary | ICD-10-CM | POA: Diagnosis present

## 2020-07-30 ENCOUNTER — Other Ambulatory Visit: Payer: Self-pay | Admitting: Family Medicine

## 2020-07-30 DIAGNOSIS — F321 Major depressive disorder, single episode, moderate: Secondary | ICD-10-CM

## 2020-07-30 DIAGNOSIS — F419 Anxiety disorder, unspecified: Secondary | ICD-10-CM

## 2020-07-30 NOTE — Progress Notes (Signed)
Spoke with the pt's sister ok per DPR and notified of results. She verbalized understanding and will inform pt. I set up rov with MW for 08/24/19 post PFT [place].

## 2020-08-17 ENCOUNTER — Encounter (HOSPITAL_COMMUNITY): Payer: Self-pay

## 2020-08-17 ENCOUNTER — Inpatient Hospital Stay (HOSPITAL_COMMUNITY): Payer: Medicaid Other

## 2020-08-17 ENCOUNTER — Emergency Department (HOSPITAL_COMMUNITY): Payer: Medicaid Other

## 2020-08-17 ENCOUNTER — Other Ambulatory Visit (HOSPITAL_COMMUNITY)
Admission: RE | Admit: 2020-08-17 | Discharge: 2020-08-17 | Disposition: A | Discharge status: Home / Self Care | Payer: Medicaid Other | Source: Ambulatory Visit | Attending: Internal Medicine | Admitting: Internal Medicine

## 2020-08-17 ENCOUNTER — Other Ambulatory Visit: Payer: Self-pay

## 2020-08-17 ENCOUNTER — Inpatient Hospital Stay (HOSPITAL_COMMUNITY)
Admission: EM | Admit: 2020-08-17 | Discharge: 2020-10-09 | DRG: 003 | Disposition: A | Discharge status: Rehabilitation Facility | Payer: Medicaid Other | Attending: Surgery | Admitting: Surgery

## 2020-08-17 DIAGNOSIS — L8990 Pressure ulcer of unspecified site, unspecified stage: Secondary | ICD-10-CM | POA: Insufficient documentation

## 2020-08-17 DIAGNOSIS — Z4659 Encounter for fitting and adjustment of other gastrointestinal appliance and device: Secondary | ICD-10-CM

## 2020-08-17 DIAGNOSIS — N400 Enlarged prostate without lower urinary tract symptoms: Secondary | ICD-10-CM

## 2020-08-17 DIAGNOSIS — J398 Other specified diseases of upper respiratory tract: Secondary | ICD-10-CM

## 2020-08-17 DIAGNOSIS — I2510 Atherosclerotic heart disease of native coronary artery without angina pectoris: Secondary | ICD-10-CM

## 2020-08-17 DIAGNOSIS — T1490XA Injury, unspecified, initial encounter: Principal | ICD-10-CM

## 2020-08-17 DIAGNOSIS — R52 Pain, unspecified: Secondary | ICD-10-CM

## 2020-08-17 DIAGNOSIS — J939 Pneumothorax, unspecified: Secondary | ICD-10-CM

## 2020-08-17 DIAGNOSIS — Z9189 Other specified personal risk factors, not elsewhere classified: Secondary | ICD-10-CM

## 2020-08-17 DIAGNOSIS — J90 Pleural effusion, not elsewhere classified: Secondary | ICD-10-CM

## 2020-08-17 DIAGNOSIS — I10 Essential (primary) hypertension: Secondary | ICD-10-CM

## 2020-08-17 DIAGNOSIS — J449 Chronic obstructive pulmonary disease, unspecified: Secondary | ICD-10-CM

## 2020-08-17 DIAGNOSIS — S22009A Unspecified fracture of unspecified thoracic vertebra, initial encounter for closed fracture: Secondary | ICD-10-CM | POA: Diagnosis present

## 2020-08-17 DIAGNOSIS — J9690 Respiratory failure, unspecified, unspecified whether with hypoxia or hypercapnia: Secondary | ICD-10-CM

## 2020-08-17 DIAGNOSIS — S22000A Wedge compression fracture of unspecified thoracic vertebra, initial encounter for closed fracture: Secondary | ICD-10-CM

## 2020-08-17 DIAGNOSIS — S129XXA Fracture of neck, unspecified, initial encounter: Secondary | ICD-10-CM

## 2020-08-17 DIAGNOSIS — Z978 Presence of other specified devices: Secondary | ICD-10-CM

## 2020-08-17 DIAGNOSIS — R609 Edema, unspecified: Secondary | ICD-10-CM

## 2020-08-17 DIAGNOSIS — Z930 Tracheostomy status: Secondary | ICD-10-CM

## 2020-08-17 DIAGNOSIS — J942 Hemothorax: Secondary | ICD-10-CM

## 2020-08-17 DIAGNOSIS — S22069A Unspecified fracture of T7-T8 vertebra, initial encounter for closed fracture: Secondary | ICD-10-CM

## 2020-08-17 DIAGNOSIS — Z452 Encounter for adjustment and management of vascular access device: Secondary | ICD-10-CM

## 2020-08-17 DIAGNOSIS — Z430 Encounter for attention to tracheostomy: Secondary | ICD-10-CM

## 2020-08-17 DIAGNOSIS — Z23 Encounter for immunization: Secondary | ICD-10-CM

## 2020-08-17 DIAGNOSIS — E860 Dehydration: Secondary | ICD-10-CM | POA: Diagnosis not present

## 2020-08-17 DIAGNOSIS — S22008S Other fracture of unspecified thoracic vertebra, sequela: Secondary | ICD-10-CM | POA: Diagnosis present

## 2020-08-17 DIAGNOSIS — S0101XA Laceration without foreign body of scalp, initial encounter: Secondary | ICD-10-CM | POA: Diagnosis present

## 2020-08-17 DIAGNOSIS — R338 Other retention of urine: Secondary | ICD-10-CM | POA: Diagnosis present

## 2020-08-17 DIAGNOSIS — J9622 Acute and chronic respiratory failure with hypercapnia: Secondary | ICD-10-CM | POA: Diagnosis not present

## 2020-08-17 DIAGNOSIS — Z7189 Other specified counseling: Secondary | ICD-10-CM | POA: Diagnosis not present

## 2020-08-17 DIAGNOSIS — S2243XD Multiple fractures of ribs, bilateral, subsequent encounter for fracture with routine healing: Secondary | ICD-10-CM | POA: Diagnosis not present

## 2020-08-17 DIAGNOSIS — M7989 Other specified soft tissue disorders: Secondary | ICD-10-CM | POA: Diagnosis not present

## 2020-08-17 DIAGNOSIS — Z20822 Contact with and (suspected) exposure to covid-19: Secondary | ICD-10-CM | POA: Insufficient documentation

## 2020-08-17 DIAGNOSIS — G928 Other toxic encephalopathy: Secondary | ICD-10-CM | POA: Diagnosis not present

## 2020-08-17 DIAGNOSIS — I252 Old myocardial infarction: Secondary | ICD-10-CM | POA: Diagnosis not present

## 2020-08-17 DIAGNOSIS — Z931 Gastrostomy status: Secondary | ICD-10-CM | POA: Diagnosis not present

## 2020-08-17 DIAGNOSIS — F17210 Nicotine dependence, cigarettes, uncomplicated: Secondary | ICD-10-CM | POA: Diagnosis present

## 2020-08-17 DIAGNOSIS — Z79899 Other long term (current) drug therapy: Secondary | ICD-10-CM | POA: Diagnosis not present

## 2020-08-17 DIAGNOSIS — J9601 Acute respiratory failure with hypoxia: Secondary | ICD-10-CM | POA: Diagnosis not present

## 2020-08-17 DIAGNOSIS — S270XXA Traumatic pneumothorax, initial encounter: Secondary | ICD-10-CM | POA: Diagnosis present

## 2020-08-17 DIAGNOSIS — S22000G Wedge compression fracture of unspecified thoracic vertebra, subsequent encounter for fracture with delayed healing: Secondary | ICD-10-CM | POA: Diagnosis not present

## 2020-08-17 DIAGNOSIS — Z955 Presence of coronary angioplasty implant and graft: Secondary | ICD-10-CM

## 2020-08-17 DIAGNOSIS — Z01812 Encounter for preprocedural laboratory examination: Secondary | ICD-10-CM | POA: Insufficient documentation

## 2020-08-17 DIAGNOSIS — D62 Acute posthemorrhagic anemia: Secondary | ICD-10-CM | POA: Diagnosis not present

## 2020-08-17 DIAGNOSIS — F419 Anxiety disorder, unspecified: Secondary | ICD-10-CM | POA: Diagnosis present

## 2020-08-17 DIAGNOSIS — S069X3S Unspecified intracranial injury with loss of consciousness of 1 hour to 5 hours 59 minutes, sequela: Secondary | ICD-10-CM | POA: Diagnosis not present

## 2020-08-17 DIAGNOSIS — N401 Enlarged prostate with lower urinary tract symptoms: Secondary | ICD-10-CM | POA: Diagnosis present

## 2020-08-17 DIAGNOSIS — E874 Mixed disorder of acid-base balance: Secondary | ICD-10-CM | POA: Diagnosis not present

## 2020-08-17 DIAGNOSIS — S2243XA Multiple fractures of ribs, bilateral, initial encounter for closed fracture: Secondary | ICD-10-CM | POA: Diagnosis present

## 2020-08-17 DIAGNOSIS — N179 Acute kidney failure, unspecified: Secondary | ICD-10-CM | POA: Diagnosis not present

## 2020-08-17 DIAGNOSIS — E872 Acidosis: Secondary | ICD-10-CM | POA: Diagnosis not present

## 2020-08-17 DIAGNOSIS — S22069D Unspecified fracture of T7-T8 vertebra, subsequent encounter for fracture with routine healing: Secondary | ICD-10-CM | POA: Diagnosis not present

## 2020-08-17 DIAGNOSIS — J439 Emphysema, unspecified: Secondary | ICD-10-CM | POA: Diagnosis not present

## 2020-08-17 DIAGNOSIS — S12600A Unspecified displaced fracture of seventh cervical vertebra, initial encounter for closed fracture: Secondary | ICD-10-CM | POA: Diagnosis present

## 2020-08-17 DIAGNOSIS — S22061A Stable burst fracture of T7-T8 vertebra, initial encounter for closed fracture: Secondary | ICD-10-CM | POA: Diagnosis present

## 2020-08-17 DIAGNOSIS — Z7982 Long term (current) use of aspirin: Secondary | ICD-10-CM | POA: Diagnosis not present

## 2020-08-17 DIAGNOSIS — J9620 Acute and chronic respiratory failure, unspecified whether with hypoxia or hypercapnia: Secondary | ICD-10-CM | POA: Diagnosis not present

## 2020-08-17 DIAGNOSIS — S1091XA Abrasion of unspecified part of neck, initial encounter: Secondary | ICD-10-CM | POA: Diagnosis present

## 2020-08-17 DIAGNOSIS — I313 Pericardial effusion (noninflammatory): Secondary | ICD-10-CM | POA: Diagnosis not present

## 2020-08-17 DIAGNOSIS — F32A Depression, unspecified: Secondary | ICD-10-CM | POA: Diagnosis not present

## 2020-08-17 DIAGNOSIS — J9621 Acute and chronic respiratory failure with hypoxia: Secondary | ICD-10-CM | POA: Diagnosis not present

## 2020-08-17 DIAGNOSIS — Y92019 Unspecified place in single-family (private) house as the place of occurrence of the external cause: Secondary | ICD-10-CM | POA: Diagnosis not present

## 2020-08-17 DIAGNOSIS — E876 Hypokalemia: Secondary | ICD-10-CM | POA: Diagnosis not present

## 2020-08-17 DIAGNOSIS — S01111A Laceration without foreign body of right eyelid and periocular area, initial encounter: Secondary | ICD-10-CM | POA: Diagnosis present

## 2020-08-17 DIAGNOSIS — F418 Other specified anxiety disorders: Secondary | ICD-10-CM | POA: Diagnosis not present

## 2020-08-17 DIAGNOSIS — Z515 Encounter for palliative care: Secondary | ICD-10-CM | POA: Diagnosis not present

## 2020-08-17 DIAGNOSIS — R1312 Dysphagia, oropharyngeal phase: Secondary | ICD-10-CM | POA: Diagnosis not present

## 2020-08-17 DIAGNOSIS — W109XXA Fall (on) (from) unspecified stairs and steps, initial encounter: Secondary | ICD-10-CM | POA: Diagnosis present

## 2020-08-17 DIAGNOSIS — I959 Hypotension, unspecified: Secondary | ICD-10-CM | POA: Diagnosis not present

## 2020-08-17 DIAGNOSIS — R5381 Other malaise: Secondary | ICD-10-CM | POA: Diagnosis not present

## 2020-08-17 DIAGNOSIS — J386 Stenosis of larynx: Secondary | ICD-10-CM | POA: Diagnosis not present

## 2020-08-17 DIAGNOSIS — J441 Chronic obstructive pulmonary disease with (acute) exacerbation: Secondary | ICD-10-CM | POA: Diagnosis not present

## 2020-08-17 DIAGNOSIS — R21 Rash and other nonspecific skin eruption: Secondary | ICD-10-CM | POA: Diagnosis not present

## 2020-08-17 DIAGNOSIS — J9602 Acute respiratory failure with hypercapnia: Secondary | ICD-10-CM | POA: Diagnosis not present

## 2020-08-17 DIAGNOSIS — E781 Pure hyperglyceridemia: Secondary | ICD-10-CM | POA: Diagnosis present

## 2020-08-17 DIAGNOSIS — R579 Shock, unspecified: Secondary | ICD-10-CM | POA: Diagnosis not present

## 2020-08-17 HISTORY — DX: Essential (primary) hypertension: I10

## 2020-08-17 HISTORY — DX: Chronic obstructive pulmonary disease, unspecified: J44.9

## 2020-08-17 HISTORY — DX: Benign prostatic hyperplasia without lower urinary tract symptoms: N40.0

## 2020-08-17 HISTORY — DX: Atherosclerotic heart disease of native coronary artery without angina pectoris: I25.10

## 2020-08-17 LAB — CBC
HCT: 48.5 % (ref 39.0–52.0)
Hemoglobin: 16 g/dL (ref 13.0–17.0)
MCH: 31.6 pg (ref 26.0–34.0)
MCHC: 33 g/dL (ref 30.0–36.0)
MCV: 95.8 fL (ref 80.0–100.0)
Platelets: 304 10*3/uL (ref 150–400)
RBC: 5.06 MIL/uL (ref 4.22–5.81)
RDW: 13.3 % (ref 11.5–15.5)
WBC: 23.1 10*3/uL — ABNORMAL HIGH (ref 4.0–10.5)
nRBC: 0.1 % (ref 0.0–0.2)

## 2020-08-17 LAB — I-STAT CHEM 8, ED
BUN: 13 mg/dL (ref 6–20)
Calcium, Ion: 1.08 mmol/L — ABNORMAL LOW (ref 1.15–1.40)
Chloride: 105 mmol/L (ref 98–111)
Creatinine, Ser: 1 mg/dL (ref 0.61–1.24)
Glucose, Bld: 126 mg/dL — ABNORMAL HIGH (ref 70–99)
HCT: 47 % (ref 39.0–52.0)
Hemoglobin: 16 g/dL (ref 13.0–17.0)
Potassium: 4 mmol/L (ref 3.5–5.1)
Sodium: 140 mmol/L (ref 135–145)
TCO2: 25 mmol/L (ref 22–32)

## 2020-08-17 LAB — URINALYSIS, ROUTINE W REFLEX MICROSCOPIC
Bacteria, UA: NONE SEEN
Bilirubin Urine: NEGATIVE
Glucose, UA: NEGATIVE mg/dL
Ketones, ur: 5 mg/dL — AB
Leukocytes,Ua: NEGATIVE
Nitrite: NEGATIVE
Protein, ur: NEGATIVE mg/dL
Specific Gravity, Urine: >1.046 — ABNORMAL HIGH (ref 1.005–1.030)
pH: 5 (ref 5.0–8.0)

## 2020-08-17 LAB — COMPREHENSIVE METABOLIC PANEL
ALT: 24 U/L (ref 0–44)
AST: 37 U/L (ref 15–41)
Albumin: 3.6 g/dL (ref 3.5–5.0)
Alkaline Phosphatase: 67 U/L (ref 38–126)
Anion gap: 11 (ref 5–15)
BUN: 11 mg/dL (ref 6–20)
CO2: 22 mmol/L (ref 22–32)
Calcium: 8.1 mg/dL — ABNORMAL LOW (ref 8.9–10.3)
Chloride: 106 mmol/L (ref 98–111)
Creatinine, Ser: 1.03 mg/dL (ref 0.61–1.24)
GFR, Estimated: >60 mL/min (ref >60)
Glucose, Bld: 128 mg/dL — ABNORMAL HIGH (ref 70–99)
Potassium: 3.9 mmol/L (ref 3.5–5.1)
Sodium: 139 mmol/L (ref 135–145)
Total Bilirubin: 0.8 mg/dL (ref 0.3–1.2)
Total Protein: 5.6 g/dL — ABNORMAL LOW (ref 6.5–8.1)

## 2020-08-17 LAB — LACTIC ACID, PLASMA: Lactic Acid, Venous: 5.2 mmol/L (ref 0.5–1.9)

## 2020-08-17 LAB — PROTIME-INR
INR: 1.2 (ref 0.8–1.2)
Prothrombin Time: 14.4 seconds (ref 11.4–15.2)

## 2020-08-17 LAB — SAMPLE TO BLOOD BANK

## 2020-08-17 LAB — ETHANOL: Alcohol, Ethyl (B): 19 mg/dL — ABNORMAL HIGH (ref <10)

## 2020-08-17 LAB — TRAUMA TEG PANEL
CFF Max Amplitude: 16.4 mm (ref 15–32)
Citrated Kaolin (R): 5.2 min (ref 4.6–9.1)
Citrated Rapid TEG (MA): 57.9 mm (ref 52–70)
Lysis at 30 Minutes: 0 % (ref 0.0–2.6)

## 2020-08-17 LAB — HIV ANTIBODY (ROUTINE TESTING W REFLEX): HIV Screen 4th Generation wRfx: NONREACTIVE

## 2020-08-17 LAB — RESP PANEL BY RT-PCR (FLU A&B, COVID) ARPGX2
Influenza A by PCR: NEGATIVE
Influenza B by PCR: NEGATIVE
SARS Coronavirus 2 by RT PCR: NEGATIVE

## 2020-08-17 MED ORDER — IOHEXOL 300 MG/ML  SOLN
100.0000 mL | Freq: Once | INTRAMUSCULAR | Status: AC | PRN
  Administered 2020-08-17: 100 mL via INTRAVENOUS

## 2020-08-17 MED ORDER — TETANUS-DIPHTH-ACELL PERTUSSIS 5-2.5-18.5 LF-MCG/0.5 IM SUSY
0.5000 mL | PREFILLED_SYRINGE | Freq: Once | INTRAMUSCULAR | Status: AC
  Administered 2020-08-17: 0.5 mL via INTRAMUSCULAR

## 2020-08-17 MED ORDER — OXYCODONE HCL 5 MG PO TABS: 5.0000 mg | ORAL_TABLET | ORAL | Status: DC | PRN

## 2020-08-17 MED ORDER — PANTOPRAZOLE SODIUM 40 MG IV SOLR
40.0000 mg | Freq: Every day | INTRAVENOUS | Status: DC
  Administered 2020-08-18 – 2020-08-23 (×6): 40 mg via INTRAVENOUS
  Filled 2020-08-17 (×6): qty 40

## 2020-08-17 MED ORDER — ONDANSETRON HCL 4 MG/2ML IJ SOLN
4.0000 mg | Freq: Four times a day (QID) | INTRAMUSCULAR | Status: DC | PRN
  Administered 2020-09-21 – 2020-10-04 (×4): 4 mg via INTRAVENOUS
  Filled 2020-08-17 (×4): qty 2

## 2020-08-17 MED ORDER — MORPHINE SULFATE (PF) 2 MG/ML IV SOLN
1.0000 mg | INTRAVENOUS | Status: DC | PRN
  Administered 2020-08-17 (×2): 4 mg via INTRAVENOUS
  Administered 2020-08-17: 2 mg via INTRAVENOUS
  Administered 2020-08-18 – 2020-08-20 (×6): 4 mg via INTRAVENOUS
  Filled 2020-08-17: qty 1
  Filled 2020-08-17 (×9): qty 2

## 2020-08-17 MED ORDER — SODIUM CHLORIDE 0.9 % IV BOLUS
1000.0000 mL | Freq: Once | INTRAVENOUS | Status: AC
  Administered 2020-08-17: 1000 mL via INTRAVENOUS

## 2020-08-17 MED ORDER — FENTANYL CITRATE (PF) 100 MCG/2ML IJ SOLN
INTRAMUSCULAR | Status: AC | PRN
  Administered 2020-08-17: 25 ug via INTRAVENOUS
  Administered 2020-08-17: 75 ug via INTRAVENOUS

## 2020-08-17 MED ORDER — FENTANYL CITRATE (PF) 100 MCG/2ML IJ SOLN
INTRAMUSCULAR | Status: AC
  Filled 2020-08-17: qty 2

## 2020-08-17 MED ORDER — METOPROLOL TARTRATE 5 MG/5ML IV SOLN
5.0000 mg | Freq: Four times a day (QID) | INTRAVENOUS | Status: DC | PRN
  Administered 2020-09-07 – 2020-09-19 (×2): 5 mg via INTRAVENOUS
  Filled 2020-08-17 (×3): qty 5

## 2020-08-17 MED ORDER — ONDANSETRON 4 MG PO TBDP: 4.0000 mg | ORAL_TABLET | Freq: Four times a day (QID) | ORAL | Status: DC | PRN

## 2020-08-17 MED ORDER — MORPHINE SULFATE (PF) 4 MG/ML IV SOLN
4.0000 mg | Freq: Once | INTRAVENOUS | Status: AC
  Administered 2020-08-17: 4 mg via INTRAVENOUS
  Filled 2020-08-17: qty 1

## 2020-08-17 MED ORDER — SODIUM CHLORIDE 0.9 % IV SOLN
INTRAVENOUS | Status: AC | PRN
  Administered 2020-08-17 (×2): 1000 mL via INTRAVENOUS

## 2020-08-17 MED ORDER — TAMSULOSIN HCL 0.4 MG PO CAPS: 0.4000 mg | ORAL_CAPSULE | Freq: Every day | ORAL | Status: DC

## 2020-08-17 MED ORDER — OXYCODONE HCL 5 MG PO TABS: 10.0000 mg | ORAL_TABLET | ORAL | Status: DC | PRN

## 2020-08-17 MED ORDER — PANTOPRAZOLE SODIUM 40 MG PO TBEC: 40.0000 mg | DELAYED_RELEASE_TABLET | Freq: Every day | ORAL | Status: DC

## 2020-08-17 MED ORDER — LACTATED RINGERS IV SOLN: INTRAVENOUS | Status: DC

## 2020-08-17 MED ORDER — IOHEXOL 350 MG/ML SOLN
60.0000 mL | Freq: Once | INTRAVENOUS | Status: AC | PRN
  Administered 2020-08-17: 60 mL via INTRAVENOUS

## 2020-08-17 MED ORDER — CEFAZOLIN SODIUM-DEXTROSE 2-4 GM/100ML-% IV SOLN
2.0000 g | Freq: Once | INTRAVENOUS | Status: AC
  Administered 2020-08-17: 2 g via INTRAVENOUS
  Filled 2020-08-17: qty 100

## 2020-08-17 MED ORDER — DEXMEDETOMIDINE HCL IN NACL 400 MCG/100ML IV SOLN
0.2000 ug/kg/h | INTRAVENOUS | Status: DC
  Administered 2020-08-17: 0.2 ug/kg/h via INTRAVENOUS
  Administered 2020-08-18: 1.2 ug/kg/h via INTRAVENOUS
  Administered 2020-08-18: 0.3 ug/kg/h via INTRAVENOUS
  Administered 2020-08-19: 1 ug/kg/h via INTRAVENOUS
  Filled 2020-08-17 (×4): qty 100
  Filled 2020-08-17 (×3): qty 200

## 2020-08-17 MED ORDER — FENTANYL CITRATE (PF) 100 MCG/2ML IJ SOLN
INTRAMUSCULAR | Status: AC | PRN
  Administered 2020-08-17: 25 ug via INTRAVENOUS

## 2020-08-17 MED ORDER — BACITRACIN ZINC 500 UNIT/GM EX OINT
TOPICAL_OINTMENT | Freq: Two times a day (BID) | CUTANEOUS | Status: DC
  Administered 2020-08-21 – 2020-08-30 (×5): 1 via TOPICAL
  Filled 2020-08-17 (×2): qty 28.4

## 2020-08-17 MED ORDER — DOCUSATE SODIUM 100 MG PO CAPS: 100.0000 mg | ORAL_CAPSULE | Freq: Two times a day (BID) | ORAL | Status: DC

## 2020-08-17 MED ORDER — IPRATROPIUM-ALBUTEROL 0.5-2.5 (3) MG/3ML IN SOLN
3.0000 mL | Freq: Four times a day (QID) | RESPIRATORY_TRACT | Status: DC | PRN
  Administered 2020-08-17 – 2020-08-21 (×5): 3 mL via RESPIRATORY_TRACT
  Filled 2020-08-17 (×5): qty 3

## 2020-08-17 MED ORDER — ACETAMINOPHEN 500 MG PO TABS
1000.0000 mg | ORAL_TABLET | Freq: Four times a day (QID) | ORAL | Status: DC
  Filled 2020-08-17: qty 2

## 2020-08-17 MED ORDER — FENTANYL CITRATE (PF) 100 MCG/2ML IJ SOLN: 50.0000 ug | Freq: Once | INTRAMUSCULAR | Status: DC

## 2020-08-17 NOTE — Progress Notes (Signed)
Orthopedic Tech Progress Note Patient Details:  Charles Weeks 11-Jan-1966 [id number] Level 2 trauma. Upgraded to level 1 Patient ID: Charles Weeks, male   DOB: Aug 07, 1965, 55 y.o.   MRN: [id number]   [name] 08/17/2020, 2:58 PM

## 2020-08-17 NOTE — H&P (Signed)
Charles Weeks 10-02-1965  [id number].    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 Sherene Sires for pulm and Dr. Diona Browner with cards who fell down about 11 stairs.  He states he drank 4-5 beers today but his ETOH is only 19 on arrival and he does not drink routinely.  He did not have syncope, but he really doesn't know what he fell.  He was found by his sister and EMS was called.  He was brought in as a level II and ultimately upgraded to a level I secondary to hypoxia in the 70s.  He responded to a non-rebreather.  Upon our arrival, he was noted to be normotensive with a large scalp laceration and complaining of horrible mid back pain.  Marland Kitchen  He was completely neuro intact moving all 4 with normal sensation and a GCS of 15.  He was taken to the scanner with injuries noted below.  ROS: ROS: Please see HPI, otherwise all other systems are currently negative, but difficult secondary to his distracting injury with severe back pain.  History reviewed. No pertinent family history.  History reviewed. No pertinent past medical history.  HTN CAD MI with DES on ASA COPD gold Depression/anxiety BPH on flomax  History reviewed. No pertinent surgical history.  tonsilectomy Harmtann's for diverticulitis with subsequent reversal  Social History:  has no history on file for tobacco use, alcohol use, and drug use.  Smokes 1/2PPD Occasional ETOH use  Allergies: Not on File  NKDA  Physical Exam: Blood pressure (!) 117/101, pulse 83, temperature (!) 97.5 F (36.4 C), temperature source Temporal, resp. rate (!) 21, height 5\' 8"  (1.727 m), weight 85.7 kg, SpO2 100 %. General: pleasant, WD, WN white male who is laying in bed in obvious distress secondary to pain HEENT: head with a 10cm scalp  and forehead laceration (repair by MD, see procedure note).  Small laceration over right eyebrow.  Sclera are noninjected.  PERRL.  Ecchymosis over right eye.  Ears and nose without any masses or lesions.  Mouth is pink and moist. Neck: c-collar in place, transitioned to [place].  Abrasion and skin tears to right side of neck.  Unable to assess cervical tenderness secondary to thoracic distracting injury.  Trachea midline. Heart: regular, rate, and rhythm.  Normal s1,s2. No obvious murmurs, gallops, or rubs noted.  Palpable radial and pedal pulses bilaterally Lungs: decrease breath sounds on right side on arrival.  On NRB, sats up to high 90s.  Respiratory effort mildly labored.  Right-sided chest wall tenderness.  No ecchymosis noted or crepitus. Abd: soft, NT, ND, +BS, no masses or organomegaly.  Large rectus diastasis.  Scars from prior midline incision and colostomy Rectal: normal tone, no stool or blood present in vault MS: all 4 extremities are symmetrical with no cyanosis, clubbing, or edema.  MAE spontaneously with no deficits.  Back with bulging on right side of mid thoracic spine and very tender over mid thoracic midline.  No obvious stepoffs. Skin: warm and dry with no masses, lesions, or rashes, see HEENt for description of abrasions and lacerations Neuro: Cranial nerves 2-12 grossly intact, sensation is normal throughout.  He has good bicep, tricep strength bilaterally.  Normal quad and ham strength in BLE.  Psych: A&Ox3 with an appropriate affect.   Results for orders placed or performed during the hospital encounter of 08/17/20 (from the past 48 hour(s))  Sample to Blood Bank     Status: None   Collection Time: 08/17/20  2:50 PM  Result Value Ref Range   Blood Bank Specimen SAMPLE AVAILABLE FOR TESTING    Sample Expiration      08/18/2020,2359 Performed at [facility], [address] [place], [place] [address]   I-Stat Chem 8, ED     Status: Abnormal   Collection Time:  08/17/20  3:02 PM  Result Value Ref Range   Sodium 140 135 - 145 mmol/L   Potassium 4.0 3.5 - 5.1 mmol/L   Chloride 105 98 - 111 mmol/L   BUN 13 6 - 20 mg/dL   Creatinine, Ser 1.911.00 0.61 - 1.24 mg/dL   Glucose, Bld 478126 (H) 70 - 99 mg/dL    Comment: Glucose reference range applies only to samples taken after fasting for at least 8 hours.   Calcium, Ion 1.08 (L) 1.15 - 1.40 mmol/L   TCO2 25 22 - 32 mmol/L   Hemoglobin 16.0 13.0 - 17.0 g/dL   HCT 29.547.0 62.139.0 - 30.852.0 %  Comprehensive metabolic panel     Status: Abnormal   Collection Time: 08/17/20  3:05 PM  Result Value Ref Range   Sodium 139 135 - 145 mmol/L   Potassium 3.9 3.5 - 5.1 mmol/L   Chloride 106 98 - 111 mmol/L   CO2 22 22 - 32 mmol/L   Glucose, Bld 128 (H) 70 - 99 mg/dL    Comment: Glucose reference range applies only to samples taken after fasting for at least 8 hours.   BUN 11 6 - 20 mg/dL   Creatinine, Ser 6.571.03 0.61 - 1.24 mg/dL   Calcium 8.1 (L) 8.9 - 10.3 mg/dL   Total Protein 5.6 (L) 6.5 - 8.1 g/dL   Albumin 3.6 3.5 - 5.0 g/dL   AST 37 15 - 41 U/L   ALT 24 0 - 44 U/L   Alkaline Phosphatase 67 38 - 126 U/L   Total Bilirubin 0.8 0.3 - 1.2 mg/dL   GFR, Estimated >84>60 >69>60 mL/min    Comment: (NOTE) Calculated using the CKD-EPI Creatinine Equation (2021)    Anion gap 11 5 - 15    Comment: Performed at [facility], [address] [place], [place] [address]  CBC     Status: Abnormal   Collection Time: 08/17/20  3:05 PM  Result Value Ref Range   WBC 23.1 (H) 4.0 - 10.5 K/uL   RBC 5.06 4.22 - 5.81 MIL/uL   Hemoglobin 16.0 13.0 - 17.0 g/dL   HCT 84.148.5 32.439.0 - 40.152.0 %   MCV 95.8 80.0 - 100.0 fL   MCH 31.6 26.0 - 34.0 pg   MCHC 33.0 30.0 - 36.0 g/dL   RDW 02.713.3 25.311.5 - 66.415.5 %   Platelets 304 150 - 400 K/uL   nRBC 0.1 0.0 - 0.2 %    Comment: Performed at [facility], [address] [place], [place] [address]  Lactic acid, plasma     Status: Abnormal   Collection Time: 08/17/20  3:05 PM  Result Value Ref  Range   Lactic Acid, Venous 5.2 (HH) 0.5 - 1.9 mmol/L    Comment: CRITICAL RESULT CALLED TO, READ BACK BY AND VERIFIED WITH: B.MICHEALSON RN 1627 08/17/20 MCCORMICK K Performed at [facility], [address] [place], [place] [address]   Ethanol     Status: Abnormal   Collection Time: 08/17/20  3:06 PM  Result Value Ref Range   Alcohol, Ethyl (B) 19 (H) <10 mg/dL    Comment: (NOTE) Lowest detectable limit for serum alcohol is 10 mg/dL.  For medical purposes only. Performed at [facility], [address] [place], [place] [address]   Protime-INR     Status: None   Collection Time: 08/17/20  3:40 PM  Result Value Ref Range   Prothrombin Time 14.4 11.4 - 15.2 seconds   INR 1.2 0.8 - 1.2    Comment: (NOTE) INR goal varies based on device and disease states. Performed at [facility], [address], [place], [place] [address]    CT HEAD WO CONTRAST  Result Date: 08/17/2020 CLINICAL DATA:  Fall down stairs EXAM: CT HEAD WITHOUT CONTRAST TECHNIQUE: Contiguous axial images were obtained from the base of the skull through the vertex without intravenous contrast. COMPARISON:  None. FINDINGS: Brain: There is no acute intracranial hemorrhage, mass effect, or edema. Gray-white differentiation is preserved. There is no extra-axial fluid collection. Ventricles and sulci are within normal limits in size and configuration. Vascular: No hyperdense vessel or unexpected calcification. Skull: Calvarium is unremarkable. Sinuses/Orbits: Primarily right maxillary and ethmoid mucosal thickening. No apparent intraorbital hematoma. Other: Right frontal scalp soft tissue swelling with deep laceration extending in proximity to calvarium. Right periorbital soft tissue swelling. IMPRESSION: No evidence of acute intracranial injury. Electronically Signed   By: Guadlupe Spanish M.D.   On: 08/17/2020 15:38   CT CHEST W CONTRAST  Result Date: 08/17/2020 CLINICAL DATA:  Blunt trauma.  Level 1 trauma  EXAM: CT CHEST, ABDOMEN, AND PELVIS WITH CONTRAST TECHNIQUE: Multidetector CT imaging of the chest, abdomen and pelvis was performed following the standard protocol during bolus administration of intravenous contrast. CONTRAST:  100 cc Omnipaque COMPARISON:  None. FINDINGS: CT CHEST FINDINGS Cardiovascular: No contour abnormality of the ascending transverse or descending thoracic aorta. No pericardial fluid. Great vessels are normal. Mediastinum/Nodes: The trachea and esophagus are normal. Small a gas dissects posterior to the esophagus related to the RIGHT pneumothorax. Lungs/Pleura: Large volume RIGHT pneumothorax. Moderate atelectasis at the RIGHT lung base. Pleural fluid layers dependently in the RIGHT hemithorax which is intermediate density. Musculoskeletal: Multiple posterior RIGHT rib fractures at the costovertebral junction. Fractures involve the T5 T6, T7 and T8 ribs on the RIGHT. Fracture of anterior middle and posterior columns of the T8 vertebral body. T8 vertebral body is has approximately 20%% loss vertebral height anteriorly. No retropulsion. Fracture through the neural arch of T7. Multiple transverse process fractures on the RIGHT involving the T4, T5, T6, T7 and T8 vertebral bodies. No evidence retropulsion. Small a gas within the canal at this level related to pneumothorax. Additionally fractures of the LEFT posterior ribs at the costovertebral junction of T7 and T8. Small amount fluid in the LEFT hemithorax. No LEFT pneumothorax. Extensive gas within the paraspinal musculature from T1 to T10. This presumably relates to traumatic pneumothorax. CT ABDOMEN AND PELVIS FINDINGS Hepatobiliary: No focal hepatic lesion. No biliary ductal dilatation. Gallbladder is normal. Common bile duct is normal. Pancreas: Pancreas is normal. No ductal dilatation. No pancreatic inflammation. Spleen: Normal spleen Adrenals/urinary tract: Adrenal glands and kidneys are normal. The ureters and bladder normal.  Stomach/Bowel: Stomach, small bowel, appendix, and cecum are normal. The colon and rectosigmoid colon are normal. Vascular/Lymphatic: Abdominal aorta is normal caliber. There is no  retroperitoneal or periportal lymphadenopathy. No pelvic lymphadenopathy. No abdominal AA or trauma. Iliac arteries are normal. Reproductive: Prostate unremarkable Other: No intraperitoneal free air or free fluid. Musculoskeletal: No pelvic fracture or lumbar spine fracture. IMPRESSION: Chest Impression: 1. Large RIGHT pneumothorax with small amount hemorrhage within the pleural space. 2. No evidence of aortic injury. 3. Three column fracture of the T8 vertebral body.  No retropulsion. 4. Fracture through the neural arch of T7. 5. Multiple RIGHT transverse process fractures involving the T4 through T8 vertebral bodies. 6. Multiple bilateral rib fractures at the costovertebral junction from T5 through T8 on the RIGHT and T7 and T8 on the LEFT. Abdomen / Pelvis Impression: 1. No evidence of solid organ injury in the abdomen pelvis. 2. No evidence of lumbar spine fracture or pelvic fracture. 3. No evidence of abdominal aortic injury. Electronically Signed   By: Genevive Bi M.D.   On: 08/17/2020 15:43   CT CERVICAL SPINE WO CONTRAST  Result Date: 08/17/2020 CLINICAL DATA:  Fall down stairs EXAM: CT CERVICAL SPINE WITHOUT CONTRAST TECHNIQUE: Multidetector CT imaging of the cervical spine was performed without intravenous contrast. Multiplanar CT image reconstructions were also generated. COMPARISON:  None. FINDINGS: Motion degraded. Alignment: No significant anteroposterior listhesis. Skull base and vertebrae: There is an acute fracture through the right C7 transverse process with mild displacement. This extends to involve the transverse foramen. Otherwise, no acute cervical spine fracture within the above limitation. Vertebral body heights are maintained. Soft tissues and spinal canal: No prevertebral soft tissue swelling. No apparent  canal hematoma. Disc levels: Multilevel degenerative changes are present including disc space narrowing, endplate osteophytes, and facet and uncovertebral hypertrophy. Upper chest: Dictated separately. Other: Foci of soft tissue air posteriorly tracking up from the thorax. IMPRESSION: Mildly displaced acute fracture through the right C7 transverse process extending to involve the transverse foramen. Otherwise, no acute cervical spine fracture identified, noting motion artifact limits evaluation for subtle abnormality. These results were called by telephone at the time of interpretation on 08/17/2020 at 3:33 pm to provider Corrin Parker, who verbally acknowledged these results. Electronically Signed   By: Guadlupe Spanish M.D.   On: 08/17/2020 15:35   CT ABDOMEN PELVIS W CONTRAST  Result Date: 08/17/2020 CLINICAL DATA:  Blunt trauma.  Level 1 trauma EXAM: CT CHEST, ABDOMEN, AND PELVIS WITH CONTRAST TECHNIQUE: Multidetector CT imaging of the chest, abdomen and pelvis was performed following the standard protocol during bolus administration of intravenous contrast. CONTRAST:  100 cc Omnipaque COMPARISON:  None. FINDINGS: CT CHEST FINDINGS Cardiovascular: No contour abnormality of the ascending transverse or descending thoracic aorta. No pericardial fluid. Great vessels are normal. Mediastinum/Nodes: The trachea and esophagus are normal. Small a gas dissects posterior to the esophagus related to the RIGHT pneumothorax. Lungs/Pleura: Large volume RIGHT pneumothorax. Moderate atelectasis at the RIGHT lung base. Pleural fluid layers dependently in the RIGHT hemithorax which is intermediate density. Musculoskeletal: Multiple posterior RIGHT rib fractures at the costovertebral junction. Fractures involve the T5 T6, T7 and T8 ribs on the RIGHT. Fracture of anterior middle and posterior columns of the T8 vertebral body. T8 vertebral body is has approximately 20%% loss vertebral height anteriorly. No retropulsion. Fracture  through the neural arch of T7. Multiple transverse process fractures on the RIGHT involving the T4, T5, T6, T7 and T8 vertebral bodies. No evidence retropulsion. Small a gas within the canal at this level related to pneumothorax. Additionally fractures of the LEFT posterior ribs at the costovertebral junction of T7 and T8. Small  amount fluid in the LEFT hemithorax. No LEFT pneumothorax. Extensive gas within the paraspinal musculature from T1 to T10. This presumably relates to traumatic pneumothorax. CT ABDOMEN AND PELVIS FINDINGS Hepatobiliary: No focal hepatic lesion. No biliary ductal dilatation. Gallbladder is normal. Common bile duct is normal. Pancreas: Pancreas is normal. No ductal dilatation. No pancreatic inflammation. Spleen: Normal spleen Adrenals/urinary tract: Adrenal glands and kidneys are normal. The ureters and bladder normal. Stomach/Bowel: Stomach, small bowel, appendix, and cecum are normal. The colon and rectosigmoid colon are normal. Vascular/Lymphatic: Abdominal aorta is normal caliber. There is no retroperitoneal or periportal lymphadenopathy. No pelvic lymphadenopathy. No abdominal AA or trauma. Iliac arteries are normal. Reproductive: Prostate unremarkable Other: No intraperitoneal free air or free fluid. Musculoskeletal: No pelvic fracture or lumbar spine fracture. IMPRESSION: Chest Impression: 1. Large RIGHT pneumothorax with small amount hemorrhage within the pleural space. 2. No evidence of aortic injury. 3. Three column fracture of the T8 vertebral body.  No retropulsion. 4. Fracture through the neural arch of T7. 5. Multiple RIGHT transverse process fractures involving the T4 through T8 vertebral bodies. 6. Multiple bilateral rib fractures at the costovertebral junction from T5 through T8 on the RIGHT and T7 and T8 on the LEFT. Abdomen / Pelvis Impression: 1. No evidence of solid organ injury in the abdomen pelvis. 2. No evidence of lumbar spine fracture or pelvic fracture. 3. No  evidence of abdominal aortic injury. Electronically Signed   By: Genevive Bi M.D.   On: 08/17/2020 15:43   DG Pelvis Portable  Result Date: 08/17/2020 CLINICAL DATA:  55 year old male status post trauma. EXAM: PORTABLE PELVIS 1-2 VIEWS COMPARISON:  None. FINDINGS: The image is under exposed. There is no evidence of pelvic fracture or diastasis. No pelvic bone lesions are seen. 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 No definite acute fracture or malalignment. Attention on forthcoming CT abdomen pelvis. Electronically Signed   By: Marliss Coots MD   On: 08/17/2020 15:26   DG Chest Portable 1 View  Result Date: 08/17/2020 CLINICAL DATA:  Post chest tube placement EXAM: PORTABLE CHEST 1 VIEW COMPARISON:  08/17/2020 FINDINGS: Underlying emphysematous disease. Interim placement of right-sided chest tube with pigtail at the right apex. Decreased right pneumothorax without visible pleural line on this exam. Normal cardiomediastinal silhouette. IMPRESSION: Interim placement of right-sided chest tube with decreased right pneumothorax. Emphysema Electronically Signed   By: Jasmine Pang M.D.   On: 08/17/2020 16:28   DG Chest Port 1 View  Result Date: 08/17/2020 CLINICAL DATA:  55 year old male status post trauma. EXAM: PORTABLE CHEST - 1 VIEW COMPARISON:  07/26/2020 FINDINGS: The patient is rotated to the left. Bilateral costophrenic angles are excluded from this study. The mediastinal contours are within normal limits. No cardiomegaly. Small right pneumothorax. Subcutaneous emphysema in the inferior right axilla. The left lung is clear. No definite pleural effusions. No definite evidence of acute, displaced rib fracture. No acute osseous abnormality. IMPRESSION: Small right pneumothorax.  Attention on forthcoming chest CT. Electronically Signed   By: Marliss Coots MD   On: 08/17/2020 15:25      Assessment/Plan Fall down stairs R PTX - CT in place on -20cm, repeat CXR in am, IS/flutter  valve/pulm toilet.  Continuous O2 therapy Multiple B rib fxs - these are at the junction of the thoracic vertebral bodies, pain control, IS 3 column T8 vertebral body fx - call NSGY at 3:16pm.  Dr. Yetta Barre eval and plans for bedrest and logroll only.  Fixation likely tomorrow in OR  T7 neural arch fx - per nsgy R TVP T4-8 - pain control, per nsgy R C7 TVP fx through foramen - C collar per Dr. Yetta Barre, CTA to rule out injury to vessels Large scalp laceration/right eyebrow laceration - repair by Dr. Bedelia Person, sutures need to be removed in 10 days Right neck abrasion/skin tear - bacitracin BID Facial ecchymosis - CT maxillofacial to rule out other injuries HTN - home lopressor 25mg  daily, hold now due to some low BP CAD/H/O MI/DES - on ASA at home.  Hold for now.  Monitor, on tele COPD gold - duonebs q 6 hr prns.  Await med red to resume home meds Depression/anxiety - home meds when available BPH - restart flomax FEN - CLD tonight, NPO p MN, IVFs VTE - on hold due to OR for spine tomorrow ID - Ancef in ED as well as tetanus Admit - inpatient, ICU  Critical care time - 120 minutes spent in patient care at bedside, procedures, and reviewing imaging with radiology, as well as discussion with consultants.  , [facility] 08/17/2020, 4:40 PM Please see Amion for pager number during day hours 7:00am-4:30pm or 7:00am -11:30am on weekends

## 2020-08-17 NOTE — Progress Notes (Signed)
Daughters updated at bedside. Sister updated over phone.

## 2020-08-17 NOTE — ED Notes (Signed)
Trauma PA notified of elevated lactic.

## 2020-08-17 NOTE — ED Notes (Signed)
  Dr Bedelia Person notified at 1704 that pt was placed on the nonrebreather due to decreasing oxygen saturation. Dr. Bedelia Person stated when pt is up stairs they can try high flow possibly. ICU RNs notified when RN took pt up to the ICU.

## 2020-08-17 NOTE — ED Notes (Signed)
Skin tears noted over right neck area- telfa placed prior to [facility] J collar placed- still able to move all extremities with normal sensation

## 2020-08-17 NOTE — ED Notes (Signed)
Trauma PA at bedside for R chest tube placement

## 2020-08-17 NOTE — ED Provider Notes (Signed)
[facility] EMERGENCY DEPARTMENT Provider Note   CSN: [id number] Arrival date & time: 08/17/20  1446     History Chief Complaint  Patient presents with  . Level 1 fall    Charles Weeks is a 55 y.o. male with history of COPD presenting after mechanical fall down the stairs.  The patient fell down approximately 12 stairs per EMS report.  This was a witnessed fall by his sister called EMS.  Reports the patient was initially unconscious, but he was awake when they arrived.  He had labored breathing.  They placed him in a C-spine collar and brought him into the ED.  On arrival the patient is complaining of pain in his back primarily.  He also has a laceration on his head and is complaining of a headache.  He says he is having difficulty breathing.  He reports a history of COPD only but denies any other medical problems.  Medical records show a history of coronary disease, hypertension, anxiety, BPH.  Does not appear that he is on blood thinners.  HPI     No past medical history on file.  There are no problems to display for this patient.    No family history on file.     Home Medications Prior to Admission medications   Not on File    Allergies    Patient has no allergy information on record.  Review of Systems   Review of Systems  Constitutional: Negative for chills and fever.  HENT: Negative for ear pain and sore throat.   Eyes: Negative for pain and visual disturbance.  Respiratory: Positive for shortness of breath. Negative for cough.   Cardiovascular: Positive for chest pain. Negative for palpitations.  Gastrointestinal: Positive for abdominal pain. Negative for vomiting.  Genitourinary: Negative for dysuria and hematuria.  Musculoskeletal: Positive for arthralgias, back pain, myalgias, neck pain and neck stiffness.  Skin: Positive for wound. Negative for rash.  Neurological: Positive for light-headedness. Negative for syncope.  All other  systems reviewed and are negative.   Physical Exam Updated Vital Signs BP 121/82   Pulse 95   Temp (!) 97.5 F (36.4 C) (Temporal)   Resp 20   Ht 5\' 8"  (1.727 m)   Wt 85.7 kg   SpO2 99%   BMI 28.74 kg/m   Physical Exam Constitutional:      General: He is not in acute distress. HENT:     Head: Normocephalic.     Comments: 4-5 inch curvilinear laceration across central forehead extending to skull, without active bleeding Hematoma and swelling of right orbit Dried blood in narse Neck:     Comments: C spine collar in place Ecchymosis of right lateral neck Voice clear Cardiovascular:     Rate and Rhythm: Regular rhythm. Tachycardia present.     Pulses: Normal pulses.  Pulmonary:     Comments: 78% on room air, improved to 95% on NRB Diminished breath sounds bilaterally, faint expiratory wheezing Abdominal:     General: There is no distension.     Tenderness: There is no abdominal tenderness.  Musculoskeletal:     Comments: Chest wall tenderness bilaterally  Skin:    General: Skin is warm and dry.  Neurological:     General: No focal deficit present.     Mental Status: He is alert.     Comments: Sensation intact, can move all extremities C, T and L spine midline tenderness     ED Results / Procedures / Treatments  Labs (all labs ordered are listed, but only abnormal results are displayed) Labs Reviewed  CBC - Abnormal; Notable for the following components:      Result Value   WBC 23.1 (*)    All other components within normal limits  I-STAT CHEM 8, ED - Abnormal; Notable for the following components:   Glucose, Bld 126 (*)    Calcium, Ion 1.08 (*)    All other components within normal limits  RESP PANEL BY RT-PCR (FLU A&B, COVID) ARPGX2  COMPREHENSIVE METABOLIC PANEL  ETHANOL  URINALYSIS, ROUTINE W REFLEX MICROSCOPIC  LACTIC ACID, PLASMA  PROTIME-INR  SAMPLE TO BLOOD BANK    EKG None  Radiology CT HEAD WO CONTRAST  Result Date: 08/17/2020 CLINICAL  DATA:  Fall down stairs EXAM: CT HEAD WITHOUT CONTRAST TECHNIQUE: Contiguous axial images were obtained from the base of the skull through the vertex without intravenous contrast. COMPARISON:  None. FINDINGS: Brain: There is no acute intracranial hemorrhage, mass effect, or edema. Gray-white differentiation is preserved. There is no extra-axial fluid collection. Ventricles and sulci are within normal limits in size and configuration. Vascular: No hyperdense vessel or unexpected calcification. Skull: Calvarium is unremarkable. Sinuses/Orbits: Primarily right maxillary and ethmoid mucosal thickening. No apparent intraorbital hematoma. Other: Right frontal scalp soft tissue swelling with deep laceration extending in proximity to calvarium. Right periorbital soft tissue swelling. IMPRESSION: No evidence of acute intracranial injury. Electronically Signed   By: Guadlupe SpanishPraneil  Patel M.D.   On: 08/17/2020 15:38   CT CHEST W CONTRAST  Result Date: 08/17/2020 CLINICAL DATA:  Blunt trauma.  Level 1 trauma EXAM: CT CHEST, ABDOMEN, AND PELVIS WITH CONTRAST TECHNIQUE: Multidetector CT imaging of the chest, abdomen and pelvis was performed following the standard protocol during bolus administration of intravenous contrast. CONTRAST:  100 cc Omnipaque COMPARISON:  None. FINDINGS: CT CHEST FINDINGS Cardiovascular: No contour abnormality of the ascending transverse or descending thoracic aorta. No pericardial fluid. Great vessels are normal. Mediastinum/Nodes: The trachea and esophagus are normal. Small a gas dissects posterior to the esophagus related to the RIGHT pneumothorax. Lungs/Pleura: Large volume RIGHT pneumothorax. Moderate atelectasis at the RIGHT lung base. Pleural fluid layers dependently in the RIGHT hemithorax which is intermediate density. Musculoskeletal: Multiple posterior RIGHT rib fractures at the costovertebral junction. Fractures involve the T5 T6, T7 and T8 ribs on the RIGHT. Fracture of anterior middle and  posterior columns of the T8 vertebral body. T8 vertebral body is has approximately 20%% loss vertebral height anteriorly. No retropulsion. Fracture through the neural arch of T7. Multiple transverse process fractures on the RIGHT involving the T4, T5, T6, T7 and T8 vertebral bodies. No evidence retropulsion. Small a gas within the canal at this level related to pneumothorax. Additionally fractures of the LEFT posterior ribs at the costovertebral junction of T7 and T8. Small amount fluid in the LEFT hemithorax. No LEFT pneumothorax. Extensive gas within the paraspinal musculature from T1 to T10. This presumably relates to traumatic pneumothorax. CT ABDOMEN AND PELVIS FINDINGS Hepatobiliary: No focal hepatic lesion. No biliary ductal dilatation. Gallbladder is normal. Common bile duct is normal. Pancreas: Pancreas is normal. No ductal dilatation. No pancreatic inflammation. Spleen: Normal spleen Adrenals/urinary tract: Adrenal glands and kidneys are normal. The ureters and bladder normal. Stomach/Bowel: Stomach, small bowel, appendix, and cecum are normal. The colon and rectosigmoid colon are normal. Vascular/Lymphatic: Abdominal aorta is normal caliber. There is no retroperitoneal or periportal lymphadenopathy. No pelvic lymphadenopathy. No abdominal AA or trauma. Iliac arteries are normal. Reproductive: Prostate unremarkable Other:  No intraperitoneal free air or free fluid. Musculoskeletal: No pelvic fracture or lumbar spine fracture. IMPRESSION: Chest Impression: 1. Large RIGHT pneumothorax with small amount hemorrhage within the pleural space. 2. No evidence of aortic injury. 3. Three column fracture of the T8 vertebral body.  No retropulsion. 4. Fracture through the neural arch of T7. 5. Multiple RIGHT transverse process fractures involving the T4 through T8 vertebral bodies. 6. Multiple bilateral rib fractures at the costovertebral junction from T5 through T8 on the RIGHT and T7 and T8 on the LEFT. Abdomen /  Pelvis Impression: 1. No evidence of solid organ injury in the abdomen pelvis. 2. No evidence of lumbar spine fracture or pelvic fracture. 3. No evidence of abdominal aortic injury. Electronically Signed   By: Genevive Bi M.D.   On: 08/17/2020 15:43   CT CERVICAL SPINE WO CONTRAST  Result Date: 08/17/2020 CLINICAL DATA:  Fall down stairs EXAM: CT CERVICAL SPINE WITHOUT CONTRAST TECHNIQUE: Multidetector CT imaging of the cervical spine was performed without intravenous contrast. Multiplanar CT image reconstructions were also generated. COMPARISON:  None. FINDINGS: Motion degraded. Alignment: No significant anteroposterior listhesis. Skull base and vertebrae: There is an acute fracture through the right C7 transverse process with mild displacement. This extends to involve the transverse foramen. Otherwise, no acute cervical spine fracture within the above limitation. Vertebral body heights are maintained. Soft tissues and spinal canal: No prevertebral soft tissue swelling. No apparent canal hematoma. Disc levels: Multilevel degenerative changes are present including disc space narrowing, endplate osteophytes, and facet and uncovertebral hypertrophy. Upper chest: Dictated separately. Other: Foci of soft tissue air posteriorly tracking up from the thorax. IMPRESSION: Mildly displaced acute fracture through the right C7 transverse process extending to involve the transverse foramen. Otherwise, no acute cervical spine fracture identified, noting motion artifact limits evaluation for subtle abnormality. These results were called by telephone at the time of interpretation on 08/17/2020 at 3:33 pm to provider Corrin Parker, who verbally acknowledged these results. Electronically Signed   By: Guadlupe Spanish M.D.   On: 08/17/2020 15:35   CT ABDOMEN PELVIS W CONTRAST  Result Date: 08/17/2020 CLINICAL DATA:  Blunt trauma.  Level 1 trauma EXAM: CT CHEST, ABDOMEN, AND PELVIS WITH CONTRAST TECHNIQUE: Multidetector CT  imaging of the chest, abdomen and pelvis was performed following the standard protocol during bolus administration of intravenous contrast. CONTRAST:  100 cc Omnipaque COMPARISON:  None. FINDINGS: CT CHEST FINDINGS Cardiovascular: No contour abnormality of the ascending transverse or descending thoracic aorta. No pericardial fluid. Great vessels are normal. Mediastinum/Nodes: The trachea and esophagus are normal. Small a gas dissects posterior to the esophagus related to the RIGHT pneumothorax. Lungs/Pleura: Large volume RIGHT pneumothorax. Moderate atelectasis at the RIGHT lung base. Pleural fluid layers dependently in the RIGHT hemithorax which is intermediate density. Musculoskeletal: Multiple posterior RIGHT rib fractures at the costovertebral junction. Fractures involve the T5 T6, T7 and T8 ribs on the RIGHT. Fracture of anterior middle and posterior columns of the T8 vertebral body. T8 vertebral body is has approximately 20%% loss vertebral height anteriorly. No retropulsion. Fracture through the neural arch of T7. Multiple transverse process fractures on the RIGHT involving the T4, T5, T6, T7 and T8 vertebral bodies. No evidence retropulsion. Small a gas within the canal at this level related to pneumothorax. Additionally fractures of the LEFT posterior ribs at the costovertebral junction of T7 and T8. Small amount fluid in the LEFT hemithorax. No LEFT pneumothorax. Extensive gas within the paraspinal musculature from T1 to T10. This  presumably relates to traumatic pneumothorax. CT ABDOMEN AND PELVIS FINDINGS Hepatobiliary: No focal hepatic lesion. No biliary ductal dilatation. Gallbladder is normal. Common bile duct is normal. Pancreas: Pancreas is normal. No ductal dilatation. No pancreatic inflammation. Spleen: Normal spleen Adrenals/urinary tract: Adrenal glands and kidneys are normal. The ureters and bladder normal. Stomach/Bowel: Stomach, small bowel, appendix, and cecum are normal. The colon and  rectosigmoid colon are normal. Vascular/Lymphatic: Abdominal aorta is normal caliber. There is no retroperitoneal or periportal lymphadenopathy. No pelvic lymphadenopathy. No abdominal AA or trauma. Iliac arteries are normal. Reproductive: Prostate unremarkable Other: No intraperitoneal free air or free fluid. Musculoskeletal: No pelvic fracture or lumbar spine fracture. IMPRESSION: Chest Impression: 1. Large RIGHT pneumothorax with small amount hemorrhage within the pleural space. 2. No evidence of aortic injury. 3. Three column fracture of the T8 vertebral body.  No retropulsion. 4. Fracture through the neural arch of T7. 5. Multiple RIGHT transverse process fractures involving the T4 through T8 vertebral bodies. 6. Multiple bilateral rib fractures at the costovertebral junction from T5 through T8 on the RIGHT and T7 and T8 on the LEFT. Abdomen / Pelvis Impression: 1. No evidence of solid organ injury in the abdomen pelvis. 2. No evidence of lumbar spine fracture or pelvic fracture. 3. No evidence of abdominal aortic injury. Electronically Signed   By: Genevive Bi M.D.   On: 08/17/2020 15:43   DG Pelvis Portable  Result Date: 08/17/2020 CLINICAL DATA:  55 year old male status post trauma. EXAM: PORTABLE PELVIS 1-2 VIEWS COMPARISON:  None. FINDINGS: The image is under exposed. There is no evidence of pelvic fracture or diastasis. No pelvic bone lesions are seen. 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 No definite acute fracture or malalignment. Attention on forthcoming CT abdomen pelvis. Electronically Signed   By: Marliss Coots MD   On: 08/17/2020 15:26   DG Chest Port 1 View  Result Date: 08/17/2020 CLINICAL DATA:  55 year old male status post trauma. EXAM: PORTABLE CHEST - 1 VIEW COMPARISON:  07/26/2020 FINDINGS: The patient is rotated to the left. Bilateral costophrenic angles are excluded from this study. The mediastinal contours are within normal limits. No cardiomegaly. Small right  pneumothorax. Subcutaneous emphysema in the inferior right axilla. The left lung is clear. No definite pleural effusions. No definite evidence of acute, displaced rib fracture. No acute osseous abnormality. IMPRESSION: Small right pneumothorax.  Attention on forthcoming chest CT. Electronically Signed   By: Marliss Coots MD   On: 08/17/2020 15:25    Procedures Procedures   Medications Ordered in ED Medications  fentaNYL (SUBLIMAZE) 100 MCG/2ML injection (has no administration in time range)  fentaNYL (SUBLIMAZE) injection 50 mcg (has no administration in time range)  fentaNYL (SUBLIMAZE) 100 MCG/2ML injection (has no administration in time range)  Tdap (BOOSTRIX) injection 0.5 mL (0.5 mLs Intramuscular Given 08/17/20 1457)  ceFAZolin (ANCEF) IVPB 2g/100 mL premix (0 g Intravenous Stopped 08/17/20 1530)  fentaNYL (SUBLIMAZE) injection (25 mcg Intravenous Given 08/17/20 1543)  0.9 %  sodium chloride infusion (1,000 mLs Intravenous New Bag/Given 08/17/20 1542)  iohexol (OMNIPAQUE) 300 MG/ML solution 100 mL (100 mLs Intravenous Contrast Given 08/17/20 1548)  fentaNYL (SUBLIMAZE) injection (25 mcg Intravenous Given 08/17/20 1552)    ED Course  I have reviewed the triage vital signs and the nursing notes.  Pertinent labs & imaging results that were available during my care of the patient were reviewed by me and considered in my medical decision making (see chart for details).  62 male present emergency department  as a mechanical fall downstairs.  Patient initially presented with diminished GCS (13-14), confusion, large head laceration, and hypoxia.  He was upgraded to a level 1 trauma in the trauma attending and PA presented to bedside.  His primary survey was intact.  His airway was clear on his initial presentation.  On nonrebreather mask we were able to improve the oxygenation.    During his assessment GCS improved to 15.  His head laceration was hemodynamically stable.  His initial x-ray showed a very  small right-sided pneumothorax.  Subsequently on CT that was obtained afterwards, this pneumothorax appears to have expanded.  Chest tube has been placed by the trauma team.  Patient also noted to have multiple spinal fractures, several of which may be unstable.  Neurosurgery has been consulted.  No evidence ICH on CT head.  IV antibiotics ordered for his large head wound.  IV fentanyl was ordered for pain management.  Tetanus was also ordered.  The patient will be managed primarily by the trauma service.  ED team on standby if assistance needed with airway management.   Clinical Course as of 08/17/20 1556  Fri Aug 17, 2020  1505 Pt at CT with trauma team as level one.  Signout also given to Dr Fredderick Phenix EDP coming onto shift [MT]    Clinical Course User Index [MT] Latrenda Irani, Kermit Balo, MD    Final Clinical Impression(s) / ED Diagnoses Final diagnoses:  Trauma  Trauma    Rx / DC Orders ED Discharge Orders    None       Terald Sleeper, MD 08/17/20 [id number]

## 2020-08-17 NOTE — ED Notes (Signed)
Pt placed on non-rebreather at 15LPM due to oxygen at 89% on 4LPM via [place]. Pt being prepped to go to CT scan and ICU room.

## 2020-08-17 NOTE — ED Notes (Signed)
Pt placed on 2LPM via [place] at this time as per Trauma PA

## 2020-08-17 NOTE — Progress Notes (Signed)
Pt admitted from ED, very SOB, increased WOB, diminished breath sounds bilaterally. MAE.  Pain med given ,neb given ,change to hi flow O2, some improvement in pt's respiratory status. Will cont to monitor closely.

## 2020-08-17 NOTE — Op Note (Signed)
   Procedure Note   Date: 08/17/2020  Procedure: complex laceration repair x1, simple laceration repair x1  Pre-op diagnosis: laceration x2, 10x2x1cm and 1x1x1cm Post-op diagnosis: same  Indication and clinical history: The patient is a 55 y.o. year old male with lacerations x2 after a fall.  Surgeon: Diamantina Monks, MD  Anesthesia: Local-lidocaine with epi  Findings:  Specimen: none EBL: <5cc Drains/Implants: none  Description of procedure: The wounds were copiously irrigated with 1L of saline. Local anesthetic was infiltrated. The procedure began with the larger, more complex laceration which had an element of degloving. Deep vicryl sutures were used to secure the scalp back to the galea with a layered closure and simple interrupted nylon sutures used to approximate the skin edges. Next, the smaller wound was repaired with two single simple interrupted nylon sutures. There were no complications.    Diamantina Monks, MD General and Trauma Surgery [facility]

## 2020-08-17 NOTE — Procedures (Signed)
Chest tube insertion  Date/Time: 08/17/2020 4:36 PM Performed by: Barnetta Chapel, PA-C Authorized by: Barnetta Chapel, PA-C   Consent:    Consent obtained:  Emergent situation   Consent given by:  Patient   Risks discussed:  Bleeding, infection, damage to surrounding structures and pain Pre-procedure details:    Skin preparation:  ChloraPrep   Preparation: Patient was prepped and draped in the usual sterile fashion   Sedation:    Sedation type: fentanyl. Anesthesia (see MAR for exact dosages):    Anesthesia method:  Local infiltration   Local anesthetic:  Lidocaine 2% w/o epi Procedure details:    Placement location:  R lateral   Scalpel size:  11   Tube size (French): 14 pigtail catheter.   Technique: Seldinger     Ultrasound guidance: no     Tension pneumothorax: no     Tube connected to:  Suction   Drainage characteristics:  Bloody   Suture material: 2-0 Prolene.   Dressing:  Petrolatum-impregnated gauze and 4x4 sterile gauze Post-procedure details:    Post-insertion x-ray findings: tube in good position     Patient tolerance of procedure:  Tolerated well, no immediate complications   Letha Cape 4:39 PM 08/17/2020

## 2020-08-17 NOTE — ED Notes (Signed)
Neurosurgery and PA at bedside

## 2020-08-17 NOTE — Progress Notes (Signed)
Pt placed on 10 l/m HFNC.  O2 Saturations are 97% at this time.

## 2020-08-17 NOTE — Consult Note (Signed)
Reason for Consult: Thoracic and cervical spine fracture Referring Physician: Trauma team  Charles Weeks is an 55 y.o. male.   HPI:  89-year-old gentleman who apparently fell down some steps over a banister today.  Details of the fall are unknown.  The patient complains of thoracic back pain.  He complains of scalp pain and has a large scalp laceration.  He came in with low oxygen saturations but was found to have a large pneumothorax and had a right chest tube placed.  He denies leg pain or numbness or tingling or weakness in his legs.  He does not recall if he walked from the scene.  He takes aspirin and may take Brilinta though the last cardiology note suggests he does not take Brilinta.  CT scan showed T8 fracture and a C7 transverse process fracture and neurosurgical evaluation was requested.  History reviewed. No pertinent past medical history.  History reviewed. No pertinent surgical history.  Not on File  Social History   Tobacco Use  . Smoking status: Not on file  . Smokeless tobacco: Not on file  Substance Use Topics  . Alcohol use: Not on file    History reviewed. No pertinent family history.   Review of Systems  Positive ROS: Unable to obtain at this time  All other systems have been reviewed and were otherwise negative with the exception of those mentioned in the HPI and as above.  Objective: Vital signs in last 24 hours: Temp:  [97.5 F (36.4 C)] 97.5 F (36.4 C) (04/01 1500) Pulse Rate:  [81-95] 81 (04/01 1630) Resp:  [18-25] 25 (04/01 1630) BP: (109-138)/(72-101) 117/81 (04/01 1630) SpO2:  [94 %-100 %] 94 % (04/01 1630) Weight:  [85.7 kg] 85.7 kg (04/01 1527)  General Appearance: Alert, cooperative, some distress from pain traumatic situation, appears stated age Head: Large right frontal scalp laceration Eyes: PERRL, conjunctiva/corneas clear, EOM's intact     Throat: benign Neck: Supple, symmetrical, trachea midline Lungs: Somewhat tachypneic, right chest  tube in place Heart: Regular rate and rhythm Abdomen: Soft protuberant with a large midline scar Extremities: Extremities normal   NEUROLOGIC:   Mental status: He is alert and answers questions fairly appropriately though he has some amnesia for the event.  No obvious aphasia. Motor Exam - grossly normal, normal tone and bulk to in bed exam Sensory Exam - grossly normal Reflexes: symmetric, no pathologic reflexes, No Hoffman's, No clonus Coordination - grossly normal Gait -not tested Balance -not tested Cranial Nerves: I: smell Not tested  II: visual acuity  OS: na    OD: na  II: visual fields Full to confrontation  II: pupils Equal, round, reactive to light  III,VII: ptosis None  III,IV,VI: extraocular muscles  Full ROM  V: mastication Normal  V: facial light touch sensation  Normal  V,VII: corneal reflex  Present  VII: facial muscle function - upper  Normal  VII: facial muscle function - lower Normal  VIII: hearing Not tested  IX: soft palate elevation  Normal  IX,X: gag reflex Present  XI: trapezius strength  5/5  XI: sternocleidomastoid strength 5/5  XI: neck flexion strength  5/5  XII: tongue strength  Normal    Data Review Lab Results  Component Value Date   WBC 23.1 (H) 08/17/2020   HGB 16.0 08/17/2020   HCT 48.5 08/17/2020   MCV 95.8 08/17/2020   PLT 304 08/17/2020   Lab Results  Component Value Date   NA 139 08/17/2020   K 3.9 08/17/2020  CL 106 08/17/2020   CO2 22 08/17/2020   BUN 11 08/17/2020   CREATININE 1.03 08/17/2020   GLUCOSE 128 (H) 08/17/2020   Lab Results  Component Value Date   INR 1.2 08/17/2020    Radiology: CT HEAD WO CONTRAST  Result Date: 08/17/2020 CLINICAL DATA:  Fall down stairs EXAM: CT HEAD WITHOUT CONTRAST TECHNIQUE: Contiguous axial images were obtained from the base of the skull through the vertex without intravenous contrast. COMPARISON:  None. FINDINGS: Brain: There is no acute intracranial hemorrhage, mass effect, or  edema. Gray-white differentiation is preserved. There is no extra-axial fluid collection. Ventricles and sulci are within normal limits in size and configuration. Vascular: No hyperdense vessel or unexpected calcification. Skull: Calvarium is unremarkable. Sinuses/Orbits: Primarily right maxillary and ethmoid mucosal thickening. No apparent intraorbital hematoma. Other: Right frontal scalp soft tissue swelling with deep laceration extending in proximity to calvarium. Right periorbital soft tissue swelling. IMPRESSION: No evidence of acute intracranial injury. Electronically Signed   By: Guadlupe Spanish M.D.   On: 08/17/2020 15:38   CT CHEST W CONTRAST  Result Date: 08/17/2020 CLINICAL DATA:  Blunt trauma.  Level 1 trauma EXAM: CT CHEST, ABDOMEN, AND PELVIS WITH CONTRAST TECHNIQUE: Multidetector CT imaging of the chest, abdomen and pelvis was performed following the standard protocol during bolus administration of intravenous contrast. CONTRAST:  100 cc Omnipaque COMPARISON:  None. FINDINGS: CT CHEST FINDINGS Cardiovascular: No contour abnormality of the ascending transverse or descending thoracic aorta. No pericardial fluid. Great vessels are normal. Mediastinum/Nodes: The trachea and esophagus are normal. Small a gas dissects posterior to the esophagus related to the RIGHT pneumothorax. Lungs/Pleura: Large volume RIGHT pneumothorax. Moderate atelectasis at the RIGHT lung base. Pleural fluid layers dependently in the RIGHT hemithorax which is intermediate density. Musculoskeletal: Multiple posterior RIGHT rib fractures at the costovertebral junction. Fractures involve the T5 T6, T7 and T8 ribs on the RIGHT. Fracture of anterior middle and posterior columns of the T8 vertebral body. T8 vertebral body is has approximately 20%% loss vertebral height anteriorly. No retropulsion. Fracture through the neural arch of T7. Multiple transverse process fractures on the RIGHT involving the T4, T5, T6, T7 and T8 vertebral  bodies. No evidence retropulsion. Small a gas within the canal at this level related to pneumothorax. Additionally fractures of the LEFT posterior ribs at the costovertebral junction of T7 and T8. Small amount fluid in the LEFT hemithorax. No LEFT pneumothorax. Extensive gas within the paraspinal musculature from T1 to T10. This presumably relates to traumatic pneumothorax. CT ABDOMEN AND PELVIS FINDINGS Hepatobiliary: No focal hepatic lesion. No biliary ductal dilatation. Gallbladder is normal. Common bile duct is normal. Pancreas: Pancreas is normal. No ductal dilatation. No pancreatic inflammation. Spleen: Normal spleen Adrenals/urinary tract: Adrenal glands and kidneys are normal. The ureters and bladder normal. Stomach/Bowel: Stomach, small bowel, appendix, and cecum are normal. The colon and rectosigmoid colon are normal. Vascular/Lymphatic: Abdominal aorta is normal caliber. There is no retroperitoneal or periportal lymphadenopathy. No pelvic lymphadenopathy. No abdominal AA or trauma. Iliac arteries are normal. Reproductive: Prostate unremarkable Other: No intraperitoneal free air or free fluid. Musculoskeletal: No pelvic fracture or lumbar spine fracture. IMPRESSION: Chest Impression: 1. Large RIGHT pneumothorax with small amount hemorrhage within the pleural space. 2. No evidence of aortic injury. 3. Three column fracture of the T8 vertebral body.  No retropulsion. 4. Fracture through the neural arch of T7. 5. Multiple RIGHT transverse process fractures involving the T4 through T8 vertebral bodies. 6. Multiple bilateral rib fractures at the  costovertebral junction from T5 through T8 on the RIGHT and T7 and T8 on the LEFT. Abdomen / Pelvis Impression: 1. No evidence of solid organ injury in the abdomen pelvis. 2. No evidence of lumbar spine fracture or pelvic fracture. 3. No evidence of abdominal aortic injury. Electronically Signed   By: Genevive Bi M.D.   On: 08/17/2020 15:43   CT CERVICAL SPINE WO  CONTRAST  Result Date: 08/17/2020 CLINICAL DATA:  Fall down stairs EXAM: CT CERVICAL SPINE WITHOUT CONTRAST TECHNIQUE: Multidetector CT imaging of the cervical spine was performed without intravenous contrast. Multiplanar CT image reconstructions were also generated. COMPARISON:  None. FINDINGS: Motion degraded. Alignment: No significant anteroposterior listhesis. Skull base and vertebrae: There is an acute fracture through the right C7 transverse process with mild displacement. This extends to involve the transverse foramen. Otherwise, no acute cervical spine fracture within the above limitation. Vertebral body heights are maintained. Soft tissues and spinal canal: No prevertebral soft tissue swelling. No apparent canal hematoma. Disc levels: Multilevel degenerative changes are present including disc space narrowing, endplate osteophytes, and facet and uncovertebral hypertrophy. Upper chest: Dictated separately. Other: Foci of soft tissue air posteriorly tracking up from the thorax. IMPRESSION: Mildly displaced acute fracture through the right C7 transverse process extending to involve the transverse foramen. Otherwise, no acute cervical spine fracture identified, noting motion artifact limits evaluation for subtle abnormality. These results were called by telephone at the time of interpretation on 08/17/2020 at 3:33 pm to provider Corrin Parker, who verbally acknowledged these results. Electronically Signed   By: Guadlupe Spanish M.D.   On: 08/17/2020 15:35   CT ABDOMEN PELVIS W CONTRAST  Result Date: 08/17/2020 CLINICAL DATA:  Blunt trauma.  Level 1 trauma EXAM: CT CHEST, ABDOMEN, AND PELVIS WITH CONTRAST TECHNIQUE: Multidetector CT imaging of the chest, abdomen and pelvis was performed following the standard protocol during bolus administration of intravenous contrast. CONTRAST:  100 cc Omnipaque COMPARISON:  None. FINDINGS: CT CHEST FINDINGS Cardiovascular: No contour abnormality of the ascending transverse or  descending thoracic aorta. No pericardial fluid. Great vessels are normal. Mediastinum/Nodes: The trachea and esophagus are normal. Small a gas dissects posterior to the esophagus related to the RIGHT pneumothorax. Lungs/Pleura: Large volume RIGHT pneumothorax. Moderate atelectasis at the RIGHT lung base. Pleural fluid layers dependently in the RIGHT hemithorax which is intermediate density. Musculoskeletal: Multiple posterior RIGHT rib fractures at the costovertebral junction. Fractures involve the T5 T6, T7 and T8 ribs on the RIGHT. Fracture of anterior middle and posterior columns of the T8 vertebral body. T8 vertebral body is has approximately 20%% loss vertebral height anteriorly. No retropulsion. Fracture through the neural arch of T7. Multiple transverse process fractures on the RIGHT involving the T4, T5, T6, T7 and T8 vertebral bodies. No evidence retropulsion. Small a gas within the canal at this level related to pneumothorax. Additionally fractures of the LEFT posterior ribs at the costovertebral junction of T7 and T8. Small amount fluid in the LEFT hemithorax. No LEFT pneumothorax. Extensive gas within the paraspinal musculature from T1 to T10. This presumably relates to traumatic pneumothorax. CT ABDOMEN AND PELVIS FINDINGS Hepatobiliary: No focal hepatic lesion. No biliary ductal dilatation. Gallbladder is normal. Common bile duct is normal. Pancreas: Pancreas is normal. No ductal dilatation. No pancreatic inflammation. Spleen: Normal spleen Adrenals/urinary tract: Adrenal glands and kidneys are normal. The ureters and bladder normal. Stomach/Bowel: Stomach, small bowel, appendix, and cecum are normal. The colon and rectosigmoid colon are normal. Vascular/Lymphatic: Abdominal aorta is normal caliber. There  is no retroperitoneal or periportal lymphadenopathy. No pelvic lymphadenopathy. No abdominal AA or trauma. Iliac arteries are normal. Reproductive: Prostate unremarkable Other: No intraperitoneal  free air or free fluid. Musculoskeletal: No pelvic fracture or lumbar spine fracture. IMPRESSION: Chest Impression: 1. Large RIGHT pneumothorax with small amount hemorrhage within the pleural space. 2. No evidence of aortic injury. 3. Three column fracture of the T8 vertebral body.  No retropulsion. 4. Fracture through the neural arch of T7. 5. Multiple RIGHT transverse process fractures involving the T4 through T8 vertebral bodies. 6. Multiple bilateral rib fractures at the costovertebral junction from T5 through T8 on the RIGHT and T7 and T8 on the LEFT. Abdomen / Pelvis Impression: 1. No evidence of solid organ injury in the abdomen pelvis. 2. No evidence of lumbar spine fracture or pelvic fracture. 3. No evidence of abdominal aortic injury. Electronically Signed   By: Genevive Bi M.D.   On: 08/17/2020 15:43   DG Pelvis Portable  Result Date: 08/17/2020 CLINICAL DATA:  55 year old male status post trauma. EXAM: PORTABLE PELVIS 1-2 VIEWS COMPARISON:  None. FINDINGS: The image is under exposed. There is no evidence of pelvic fracture or diastasis. No pelvic bone lesions are seen. 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 No definite acute fracture or malalignment. Attention on forthcoming CT abdomen pelvis. Electronically Signed   By: Marliss Coots MD   On: 08/17/2020 15:26   DG Chest Portable 1 View  Result Date: 08/17/2020 CLINICAL DATA:  Post chest tube placement EXAM: PORTABLE CHEST 1 VIEW COMPARISON:  08/17/2020 FINDINGS: Underlying emphysematous disease. Interim placement of right-sided chest tube with pigtail at the right apex. Decreased right pneumothorax without visible pleural line on this exam. Normal cardiomediastinal silhouette. IMPRESSION: Interim placement of right-sided chest tube with decreased right pneumothorax. Emphysema Electronically Signed   By: Jasmine Pang M.D.   On: 08/17/2020 16:28   DG Chest Port 1 View  Result Date: 08/17/2020 CLINICAL DATA:  56 year old male  status post trauma. EXAM: PORTABLE CHEST - 1 VIEW COMPARISON:  07/26/2020 FINDINGS: The patient is rotated to the left. Bilateral costophrenic angles are excluded from this study. The mediastinal contours are within normal limits. No cardiomegaly. Small right pneumothorax. Subcutaneous emphysema in the inferior right axilla. The left lung is clear. No definite pleural effusions. No definite evidence of acute, displaced rib fracture. No acute osseous abnormality. IMPRESSION: Small right pneumothorax.  Attention on forthcoming chest CT. Electronically Signed   By: Marliss Coots MD   On: 08/17/2020 15:25     Assessment/Plan: Estimated body mass index is 28.74 kg/m as calculated from the following:   Height as of this encounter: 5\' 8"  (1.727 m).   Weight as of this encounter: 85.7 kg.   1.  3 column fracture through T8 -we have recommended a open reduction internal fixation from T6-T9 with pedicle screw instrumentation.  We have described the surgery to his daughters as best we can.  We have talked about typical outcomes and recovery times.  They understand the risk of the surgery include but are not limited to bleeding, infection, CSF leak, nerve root injury, spinal cord injury, paralysis, weakness, loss of bowel bladder or sexual function, pseudoarthrosis, hardware failure, need for further surgery, lack of relief of symptoms, worsening symptoms, and anesthesia risk including DVT pneumonia MI and death.  Agrees to proceed.  2.  C7 transverse process fracture-should be very stable and likely does not need treatment in a collar but will do collar for now.  We  will do this at least until he can have flexion-extension views.  All of this has been discussed with the trauma team.  He is posted for surgery tomorrow morning for now.  We will wait on the TEG since there is some concern he may have been taken Brilinta.  If this is abnormal then we will cancel the surgery and wait for Rex coagulation studies to  improve.   Tia Alert 08/17/2020 4:45 PM

## 2020-08-18 ENCOUNTER — Encounter (HOSPITAL_COMMUNITY): Payer: Self-pay | Admitting: Certified Registered"

## 2020-08-18 ENCOUNTER — Inpatient Hospital Stay (HOSPITAL_COMMUNITY): Payer: Medicaid Other

## 2020-08-18 ENCOUNTER — Encounter (HOSPITAL_COMMUNITY): Payer: Self-pay

## 2020-08-18 DIAGNOSIS — J9602 Acute respiratory failure with hypercapnia: Secondary | ICD-10-CM | POA: Diagnosis not present

## 2020-08-18 DIAGNOSIS — J9601 Acute respiratory failure with hypoxia: Secondary | ICD-10-CM | POA: Diagnosis not present

## 2020-08-18 DIAGNOSIS — E872 Acidosis: Secondary | ICD-10-CM

## 2020-08-18 DIAGNOSIS — J441 Chronic obstructive pulmonary disease with (acute) exacerbation: Secondary | ICD-10-CM

## 2020-08-18 DIAGNOSIS — J939 Pneumothorax, unspecified: Secondary | ICD-10-CM

## 2020-08-18 DIAGNOSIS — I313 Pericardial effusion (noninflammatory): Secondary | ICD-10-CM | POA: Diagnosis not present

## 2020-08-18 DIAGNOSIS — R579 Shock, unspecified: Secondary | ICD-10-CM | POA: Diagnosis not present

## 2020-08-18 LAB — CBC WITH DIFFERENTIAL/PLATELET
Abs Immature Granulocytes: 0 10*3/uL (ref 0.00–0.07)
Basophils Absolute: 0 10*3/uL (ref 0.0–0.1)
Basophils Relative: 0 %
Eosinophils Absolute: 0.8 10*3/uL — ABNORMAL HIGH (ref 0.0–0.5)
Eosinophils Relative: 3 %
HCT: 31.5 % — ABNORMAL LOW (ref 39.0–52.0)
Hemoglobin: 10.3 g/dL — ABNORMAL LOW (ref 13.0–17.0)
Lymphocytes Relative: 0 %
Lymphs Abs: 0 10*3/uL — ABNORMAL LOW (ref 0.7–4.0)
MCH: 31.7 pg (ref 26.0–34.0)
MCHC: 32.7 g/dL (ref 30.0–36.0)
MCV: 96.9 fL (ref 80.0–100.0)
Monocytes Absolute: 1 10*3/uL (ref 0.1–1.0)
Monocytes Relative: 4 %
Neutro Abs: 23.5 10*3/uL — ABNORMAL HIGH (ref 1.7–7.7)
Neutrophils Relative %: 93 %
Platelets: 200 10*3/uL (ref 150–400)
RBC: 3.25 MIL/uL — ABNORMAL LOW (ref 4.22–5.81)
RDW: 13.2 % (ref 11.5–15.5)
WBC: 25.3 10*3/uL — ABNORMAL HIGH (ref 4.0–10.5)
nRBC: 0 % (ref 0.0–0.2)
nRBC: 0 /100 WBC

## 2020-08-18 LAB — BASIC METABOLIC PANEL
Anion gap: 13 (ref 5–15)
Anion gap: 8 (ref 5–15)
Anion gap: 6 (ref 5–15)
BUN: 15 mg/dL (ref 6–20)
BUN: 18 mg/dL (ref 6–20)
BUN: 20 mg/dL (ref 6–20)
CO2: 18 mmol/L — ABNORMAL LOW (ref 22–32)
CO2: 22 mmol/L (ref 22–32)
CO2: 24 mmol/L (ref 22–32)
Calcium: 8 mg/dL — ABNORMAL LOW (ref 8.9–10.3)
Calcium: 7.5 mg/dL — ABNORMAL LOW (ref 8.9–10.3)
Calcium: 7.6 mg/dL — ABNORMAL LOW (ref 8.9–10.3)
Chloride: 110 mmol/L (ref 98–111)
Chloride: 106 mmol/L (ref 98–111)
Chloride: 108 mmol/L (ref 98–111)
Creatinine, Ser: 1.1 mg/dL (ref 0.61–1.24)
Creatinine, Ser: 1.32 mg/dL — ABNORMAL HIGH (ref 0.61–1.24)
Creatinine, Ser: 1.52 mg/dL — ABNORMAL HIGH (ref 0.61–1.24)
GFR, Estimated: >60 mL/min (ref >60)
GFR, Estimated: >60 mL/min (ref >60)
GFR, Estimated: 54 mL/min — ABNORMAL LOW (ref >60)
Glucose, Bld: 127 mg/dL — ABNORMAL HIGH (ref 70–99)
Glucose, Bld: 198 mg/dL — ABNORMAL HIGH (ref 70–99)
Glucose, Bld: 206 mg/dL — ABNORMAL HIGH (ref 70–99)
Potassium: 5 mmol/L (ref 3.5–5.1)
Potassium: 4.4 mmol/L (ref 3.5–5.1)
Potassium: 4.6 mmol/L (ref 3.5–5.1)
Sodium: 141 mmol/L (ref 135–145)
Sodium: 136 mmol/L (ref 135–145)
Sodium: 138 mmol/L (ref 135–145)

## 2020-08-18 LAB — CBC
HCT: 41.3 % (ref 39.0–52.0)
HCT: 29.8 % — ABNORMAL LOW (ref 39.0–52.0)
Hemoglobin: 13.7 g/dL (ref 13.0–17.0)
Hemoglobin: 10 g/dL — ABNORMAL LOW (ref 13.0–17.0)
MCH: 32.1 pg (ref 26.0–34.0)
MCH: 32.2 pg (ref 26.0–34.0)
MCHC: 33.2 g/dL (ref 30.0–36.0)
MCHC: 33.6 g/dL (ref 30.0–36.0)
MCV: 96.7 fL (ref 80.0–100.0)
MCV: 95.8 fL (ref 80.0–100.0)
Platelets: 207 K/uL (ref 150–400)
Platelets: 220 10*3/uL (ref 150–400)
RBC: 4.27 MIL/uL (ref 4.22–5.81)
RBC: 3.11 MIL/uL — ABNORMAL LOW (ref 4.22–5.81)
RDW: 13.3 % (ref 11.5–15.5)
RDW: 13.2 % (ref 11.5–15.5)
WBC: 24.9 K/uL — ABNORMAL HIGH (ref 4.0–10.5)
WBC: 23.2 10*3/uL — ABNORMAL HIGH (ref 4.0–10.5)
nRBC: 0 % (ref 0.0–0.2)
nRBC: 0 % (ref 0.0–0.2)

## 2020-08-18 LAB — POCT I-STAT 7, (LYTES, BLD GAS, ICA,H+H)
Acid-base deficit: 5 mmol/L — ABNORMAL HIGH (ref 0.0–2.0)
Acid-base deficit: 4 mmol/L — ABNORMAL HIGH (ref 0.0–2.0)
Bicarbonate: 23.2 mmol/L (ref 20.0–28.0)
Bicarbonate: 22.9 mmol/L (ref 20.0–28.0)
Calcium, Ion: 1.21 mmol/L (ref 1.15–1.40)
Calcium, Ion: 1.17 mmol/L (ref 1.15–1.40)
HCT: 30 % — ABNORMAL LOW (ref 39.0–52.0)
HCT: 29 % — ABNORMAL LOW (ref 39.0–52.0)
Hemoglobin: 10.2 g/dL — ABNORMAL LOW (ref 13.0–17.0)
Hemoglobin: 9.9 g/dL — ABNORMAL LOW (ref 13.0–17.0)
O2 Saturation: 94 %
O2 Saturation: 99 %
Patient temperature: 96.4
Patient temperature: 97.1
Potassium: 4.5 mmol/L (ref 3.5–5.1)
Potassium: 4.7 mmol/L (ref 3.5–5.1)
Sodium: 140 mmol/L (ref 135–145)
Sodium: 137 mmol/L (ref 135–145)
TCO2: 25 mmol/L (ref 22–32)
TCO2: 24 mmol/L (ref 22–32)
pCO2 arterial: 51.7 mmHg — ABNORMAL HIGH (ref 32.0–48.0)
pCO2 arterial: 45.4 mmHg (ref 32.0–48.0)
pH, Arterial: 7.253 — ABNORMAL LOW (ref 7.350–7.450)
pH, Arterial: 7.307 — ABNORMAL LOW (ref 7.350–7.450)
pO2, Arterial: 76 mmHg — ABNORMAL LOW (ref 83.0–108.0)
pO2, Arterial: 134 mmHg — ABNORMAL HIGH (ref 83.0–108.0)

## 2020-08-18 LAB — APTT: aPTT: 31 seconds (ref 24–36)

## 2020-08-18 LAB — GLUCOSE, CAPILLARY: Glucose-Capillary: 204 mg/dL — ABNORMAL HIGH (ref 70–99)

## 2020-08-18 LAB — RAPID URINE DRUG SCREEN, HOSP PERFORMED
Amphetamines: NOT DETECTED
Barbiturates: NOT DETECTED
Benzodiazepines: NOT DETECTED
Cocaine: NOT DETECTED
Opiates: POSITIVE — AB
Tetrahydrocannabinol: POSITIVE — AB

## 2020-08-18 LAB — ECHOCARDIOGRAM COMPLETE
AR max vel: 2.83 cm2
AV Area VTI: 3.17 cm2
AV Area mean vel: 2.96 cm2
AV Mean grad: 1 mmHg
AV Peak grad: 1.6 mmHg
Ao pk vel: 0.64 m/s
Height: 68 in
S' Lateral: 3 cm
Weight: 3024 oz

## 2020-08-18 LAB — PHOSPHORUS: Phosphorus: 5.4 mg/dL — ABNORMAL HIGH (ref 2.5–4.6)

## 2020-08-18 LAB — SARS CORONAVIRUS 2 (TAT 6-24 HRS): SARS Coronavirus 2: NEGATIVE

## 2020-08-18 LAB — MAGNESIUM: Magnesium: 1.8 mg/dL (ref 1.7–2.4)

## 2020-08-18 LAB — LACTIC ACID, PLASMA: Lactic Acid, Venous: 2.7 mmol/L (ref 0.5–1.9)

## 2020-08-18 LAB — TROPONIN I (HIGH SENSITIVITY)
Troponin I (High Sensitivity): 122 ng/L (ref <18)
Troponin I (High Sensitivity): 290 ng/L (ref <18)

## 2020-08-18 MED ORDER — ETOMIDATE 2 MG/ML IV SOLN: 20.0000 mg | Freq: Once | INTRAVENOUS | Status: DC

## 2020-08-18 MED ORDER — LACTATED RINGERS IV BOLUS
1000.0000 mL | Freq: Once | INTRAVENOUS | Status: AC
  Administered 2020-08-18: 1000 mL via INTRAVENOUS

## 2020-08-18 MED ORDER — ORAL CARE MOUTH RINSE
15.0000 mL | OROMUCOSAL | Status: DC
  Administered 2020-08-18 – 2020-09-30 (×395): 15 mL via OROMUCOSAL

## 2020-08-18 MED ORDER — MIDAZOLAM HCL 2 MG/2ML IJ SOLN
INTRAMUSCULAR | Status: AC
  Filled 2020-08-18: qty 2

## 2020-08-18 MED ORDER — MAGNESIUM SULFATE 2 GM/50ML IV SOLN
2.0000 g | Freq: Once | INTRAVENOUS | Status: AC
  Administered 2020-08-18: 2 g via INTRAVENOUS
  Filled 2020-08-18: qty 50

## 2020-08-18 MED ORDER — PROPOFOL 1000 MG/100ML IV EMUL
INTRAVENOUS | Status: AC
  Filled 2020-08-18: qty 100

## 2020-08-18 MED ORDER — ALBUTEROL (5 MG/ML) CONTINUOUS INHALATION SOLN
2.5000 mg/h | INHALATION_SOLUTION | RESPIRATORY_TRACT | Status: DC
  Administered 2020-08-18: 2.5 mg/h via RESPIRATORY_TRACT
  Filled 2020-08-18 (×2): qty 20

## 2020-08-18 MED ORDER — ROCURONIUM BROMIDE 10 MG/ML (PF) SYRINGE
100.0000 mg | PREFILLED_SYRINGE | Freq: Once | INTRAVENOUS | Status: AC
  Administered 2020-08-18: 100 mg via INTRAVENOUS

## 2020-08-18 MED ORDER — METHYLPREDNISOLONE SODIUM SUCC 40 MG IJ SOLR
40.0000 mg | Freq: Two times a day (BID) | INTRAMUSCULAR | Status: AC
  Administered 2020-08-18 – 2020-08-22 (×10): 40 mg via INTRAVENOUS
  Filled 2020-08-18 (×11): qty 1

## 2020-08-18 MED ORDER — NOREPINEPHRINE 4 MG/250ML-% IV SOLN
2.0000 ug/min | INTRAVENOUS | Status: DC
  Administered 2020-08-18: 4 ug/min via INTRAVENOUS
  Filled 2020-08-18: qty 250

## 2020-08-18 MED ORDER — OXYCODONE HCL 5 MG PO TABS
5.0000 mg | ORAL_TABLET | ORAL | Status: DC | PRN
  Administered 2020-09-05 – 2020-10-09 (×14): 5 mg
  Filled 2020-08-18 (×14): qty 1

## 2020-08-18 MED ORDER — FENTANYL CITRATE (PF) 100 MCG/2ML IJ SOLN
INTRAMUSCULAR | Status: AC
  Filled 2020-08-18: qty 2

## 2020-08-18 MED ORDER — ROCURONIUM BROMIDE 10 MG/ML (PF) SYRINGE
PREFILLED_SYRINGE | INTRAVENOUS | Status: AC
  Filled 2020-08-18: qty 10

## 2020-08-18 MED ORDER — CHLORHEXIDINE GLUCONATE CLOTH 2 % EX PADS
6.0000 | MEDICATED_PAD | Freq: Every day | CUTANEOUS | Status: DC
  Administered 2020-08-19 – 2020-10-08 (×49): 6 via TOPICAL

## 2020-08-18 MED ORDER — DOCUSATE SODIUM 50 MG/5ML PO LIQD: 100.0000 mg | Freq: Two times a day (BID) | ORAL | Status: DC

## 2020-08-18 MED ORDER — NOREPINEPHRINE 16 MG/250ML-% IV SOLN
0.0000 ug/min | INTRAVENOUS | Status: DC
  Administered 2020-08-18: 2 ug/min via INTRAVENOUS
  Administered 2020-08-19: 16 ug/min via INTRAVENOUS
  Administered 2020-08-20: 10 ug/min via INTRAVENOUS
  Administered 2020-08-20: 2 ug/min via INTRAVENOUS
  Filled 2020-08-18 (×4): qty 250

## 2020-08-18 MED ORDER — VASOPRESSIN 20 UNITS/100 ML INFUSION FOR SHOCK
0.0000 [IU]/min | INTRAVENOUS | Status: DC
  Administered 2020-08-18: 0.03 [IU]/min via INTRAVENOUS
  Filled 2020-08-18: qty 100

## 2020-08-18 MED ORDER — ETOMIDATE 2 MG/ML IV SOLN
INTRAVENOUS | Status: AC
  Administered 2020-08-18: 20 mg
  Filled 2020-08-18: qty 20

## 2020-08-18 MED ORDER — SODIUM CHLORIDE 0.9 % IV SOLN
250.0000 mL | INTRAVENOUS | Status: DC
  Administered 2020-08-19 – 2020-09-08 (×2): 250 mL via INTRAVENOUS

## 2020-08-18 MED ORDER — NOREPINEPHRINE 4 MG/250ML-% IV SOLN: 0.0000 ug/min | INTRAVENOUS | Status: DC

## 2020-08-18 MED ORDER — ACETAMINOPHEN 500 MG PO TABS
1000.0000 mg | ORAL_TABLET | Freq: Four times a day (QID) | ORAL | Status: DC
  Administered 2020-08-18 – 2020-08-27 (×30): 1000 mg
  Filled 2020-08-18 (×31): qty 2

## 2020-08-18 MED ORDER — OXYCODONE HCL 5 MG PO TABS
10.0000 mg | ORAL_TABLET | ORAL | Status: DC | PRN
  Administered 2020-08-18 – 2020-10-06 (×106): 10 mg
  Filled 2020-08-18 (×109): qty 2

## 2020-08-18 MED ORDER — CHLORHEXIDINE GLUCONATE 0.12% ORAL RINSE (MEDLINE KIT)
15.0000 mL | Freq: Two times a day (BID) | OROMUCOSAL | Status: DC
  Administered 2020-08-18 – 2020-09-30 (×82): 15 mL via OROMUCOSAL

## 2020-08-18 MED ORDER — SUFENTANIL CITRATE 50 MCG/ML IV SOLN
INTRAVENOUS | Status: AC
  Filled 2020-08-18: qty 1

## 2020-08-18 MED ORDER — FENTANYL CITRATE (PF) 250 MCG/5ML IJ SOLN
INTRAMUSCULAR | Status: AC
  Filled 2020-08-18: qty 5

## 2020-08-18 MED ORDER — NOREPINEPHRINE 4 MG/250ML-% IV SOLN
INTRAVENOUS | Status: AC
  Filled 2020-08-18: qty 250

## 2020-08-18 MED ORDER — PROPOFOL 1000 MG/100ML IV EMUL
5.0000 ug/kg/min | INTRAVENOUS | Status: DC
  Administered 2020-08-18: 40 ug/kg/min via INTRAVENOUS
  Administered 2020-08-18: 5 ug/kg/min via INTRAVENOUS
  Administered 2020-08-18: 50 ug/kg/min via INTRAVENOUS
  Administered 2020-08-19: 70 ug/kg/min via INTRAVENOUS
  Filled 2020-08-18 (×3): qty 200
  Filled 2020-08-18: qty 100

## 2020-08-18 MED ORDER — PROPOFOL 10 MG/ML IV BOLUS
INTRAVENOUS | Status: AC
  Filled 2020-08-18: qty 20

## 2020-08-18 NOTE — TOC CAGE-AID Note (Signed)
Transition of Care [facility]) - CAGE-AID Screening   Patient Details  Name: Charles Weeks MRN: [id number] Date of Birth: 1965-12-28   Clinical Narrative:  Patient emergently intubated this AM, respiratory distress. Unstable on pressors and sedated. Patient cannot participate in screening and will likely be intubated for several days. Will attempt to complete at a later time.  CAGE-AID Screening: Substance Abuse Screening unable to be completed due to: : Patient unable to participate (pt emergently intubated this AM, pressors, unstable)

## 2020-08-18 NOTE — Anesthesia Preprocedure Evaluation (Deleted)
Anesthesia Evaluation    Airway        Dental   Pulmonary COPD,  COPD inhaler, Current Smoker,  R PTX and chest tube          Cardiovascular hypertension, Pt. on medications and Pt. on home beta blockers + CAD and + Cardiac Stents       Neuro/Psych T8 fracture and a C7 transverse process fracture   C-spine not cleared    GI/Hepatic   Endo/Other    Renal/GU      Musculoskeletal   Abdominal   Peds  Hematology   Anesthesia Other Findings   Reproductive/Obstetrics                            Anesthesia Physical Anesthesia Plan  ASA: III  Anesthesia Plan: General   Post-op Pain Management:    Induction: Intravenous  PONV Risk Score and Plan: 1 and Ondansetron and Dexamethasone  Airway Management Planned: Oral ETT and Video Laryngoscope Planned  Additional Equipment: None  Intra-op Plan:   Post-operative Plan:   Informed Consent:   Plan Discussed with:   Anesthesia Plan Comments:         Anesthesia Quick Evaluation

## 2020-08-18 NOTE — Progress Notes (Signed)
Per report, pt began experiencing COPD exacerbation 0630, O2 dropping, HR 140s, albuterol given prior to this RN coming on shift. Pt still in distress as this RN arrived.  Elink paged to assist in getting MD to bedside for possible intubation.  Diannia Ruder MD to bedside, pt intubated, IO placed, and central line. Orders placed for levophed and propofol; precedex restarted. MD P. Stechschulte paged and made aware of morning events, MD to bedside.  Pt sister arrived to room and briefed on events by team.   1215 Paged MD P. Stechschulte as pt now maxed on quad levophed, with BP minimally responsive; orders received for vasopressin, started when received from RX.   1400 minimal urine output, MD P. Stechshulte made aware, bladder scan performed, 78ml.

## 2020-08-18 NOTE — Progress Notes (Signed)
Patient with COPD exacerbation; respiratory and MD at bedside.

## 2020-08-18 NOTE — Progress Notes (Signed)
  Echocardiogram 2D Echocardiogram has been performed.  Charles Weeks 08/18/2020, 11:37 AM

## 2020-08-18 NOTE — Procedures (Signed)
Intubation Procedure Note  Charles Weeks  [id number]  05-Jan-1966  Date:08/18/20  Time:8:17 AM   Provider Performing:Dalayah Deahl    Procedure: Intubation (31500)  Indication(s) Respiratory Failure  Consent Unable to obtain consent due to emergent nature of procedure.   Anesthesia Etomidate and Rocuronium   Time Out Verified patient identification, verified procedure, site/side was marked, verified correct patient position, special equipment/implants available, medications/allergies/relevant history reviewed, required imaging and test results available.   Sterile Technique Usual hand hygeine, masks, and gloves were used   Procedure Description Patient positioned in bed supine.  Sedation given as noted above.  Patient was intubated with endotracheal tube using Glidescope.  View was Grade 1 full glottis .  Number of attempts was 1.  Colorimetric CO2 detector was consistent with tracheal placement.   Complications/Tolerance None; patient tolerated the procedure well. Chest X-ray is ordered to verify placement.   EBL Minimal   Specimen(s) None

## 2020-08-18 NOTE — Procedures (Signed)
Central Venous Catheter Insertion Procedure Note  Charles Weeks  [id number]  Jun 15, 1965  Date:08/18/20  Time:8:19 AM   Provider Performing:Mataeo Ingwersen   Procedure: Insertion of Non-tunneled Central Venous Catheter(36556) with US guidance (21308)   Indication(s) Medication administration  Consent Unable to obtain consent due to emergent nature of procedure.  Anesthesia Topical only with 1% lidocaine   Timeout Verified patient identification, verified procedure, site/side was marked, verified correct patient position, special equipment/implants available, medications/allergies/relevant history reviewed, required imaging and test results available.  Sterile Technique Maximal sterile technique including full sterile barrier drape, hand hygiene, sterile gown, sterile gloves, mask, hair covering, sterile ultrasound probe cover (if used).  Procedure Description Area of catheter insertion was cleaned with chlorhexidine and draped in sterile fashion.  With real-time ultrasound guidance a central venous catheter was placed into the right subclavian vein. Nonpulsatile blood flow and easy flushing noted in all ports.  The catheter was sutured in place and sterile dressing applied.  Complications/Tolerance None; patient tolerated the procedure well. Chest X-ray is ordered to verify placement for internal jugular or subclavian cannulation.   Chest x-ray is not ordered for femoral cannulation.  EBL Minimal  Specimen(s) None

## 2020-08-18 NOTE — Consult Note (Signed)
NAME:  Charles Weeks, MRN:  [id number], DOB:  [birth year], LOS: 1 ADMISSION DATE:  08/17/2020, CONSULTATION DATE: 08/18/2020 REFERRING MD:  Quentin Ore, CHIEF COMPLAINT: Shortness of breath  History of Present Illness:  55 year old male with COPD, coronary artery disease status post stent who presented status post fall from his chair, noted to have multiple rib fractures, right pneumothorax and vertebral body fractures, admitted under trauma service and neurosurgery consulting. This morning patient was noted to be short of breath, became hypoxic with labored breathing, PCCM was consulted, upon evaluation patient was noted to be belly breathing, with minimal air entry and prolonged expiratory phase, decision was made to intubate for acute hypoxic/hypercapnic respiratory failure.  Pertinent  Medical History   Past Medical History:  Diagnosis Date  . BPH (benign prostatic hyperplasia) 08/17/2020  . CAD (coronary artery disease) 08/17/2020  . COPD (chronic obstructive pulmonary disease) (HCC) 08/17/2020   gold  . HTN (hypertension) 08/17/2020     Significant Hospital Events: Including procedures, antibiotic start and stop dates in addition to other pertinent events   . 4/1 admitted . 4/2 ETT/right subclavian CVC  Interim History / Subjective:  Intubated and placed on mechanical ventilation  Objective   Blood pressure (!) 149/81, pulse 99, temperature 97.9 F (36.6 C), temperature source Oral, resp. rate (!) 25, height 5\' 8"  (1.727 m), weight 85.7 kg, SpO2 99 %.    Vent Mode: PRVC FiO2 (%):  [50 %-80 %] 80 % Set Rate:  [25 bmp] 25 bmp Vt Set:  [480 mL-540 mL] 480 mL PEEP:  [10 cmH20] 10 cmH20 Plateau Pressure:  [20 cmH20] 20 cmH20   Intake/Output Summary (Last 24 hours) at 08/18/2020 1053 Last data filed at 08/18/2020 0600 Gross per 24 hour  Intake 2885.81 ml  Output --  Net 2885.81 ml   Filed Weights   08/17/20 1527  Weight: 85.7 kg    Examination:   Physical  exam: General: Acutely ill-appearing male, orally intubated HEENT: [place]/AT, eyes anicteric.  ETT and OGT in place.  Aspen neck collar in place Neuro: Sedated, paralyzed, not following commands.  Eyes are closed.  Pupils 3 mm bilateral reactive to light Chest: Very little air entry bilaterally, expiratory wheezes, no rhonchi or crackles Heart: Tachycardic, regular rhythm, no murmurs or gallops Abdomen: Soft, nontender, nondistended, bowel sounds present Skin: No rash  Labs/imaging that I havepersonally reviewed  (right click and "Reselect all SmartList Selections" daily)  Lactate 5.5  Resolved Hospital Problem list     Assessment & Plan:  Acute hypoxic/hypercapnic respiratory failure due to acute COPD exacerbation Acute mixed metabolic and respiratory acidosis Acute metabolic encephalopathy due to CO2 narcosis Acute traumatic right-sided pneumothorax Multiple rib fractures Multiple vertebral fractures Large scalp laceration Hypertension Coronary artery disease Lactic acidosis Acute kidney injury, likely due to dehydration  Patient was intubated and placed on mechanical ventilation Ventilator setting was adjusted to clear hypercapnia Trend ABGs Continue albuterol nebs Started on IV steroid Solu-Medrol 40 mg twice daily Minimize sedation with RASS goal -1 S/p chest tube, trauma surgery is following Patient was scheduled vertebral surgery, it was canceled due to acute respiratory failure and change in condition Patient serum creatinine rose from 1-1.3 Give 2 L of IV fluid, repeat lactate is trending down  PCCM will continue to follow along, please call with question  Best practice (right click and "Reselect all SmartList Selections" daily)  Diet:  Tube Feed  Pain/Anxiety/Delirium protocol (if indicated): Yes (RASS goal -1,-2) VAP protocol (if indicated): Yes DVT  prophylaxis: SCD GI prophylaxis: PPI Glucose control:  SSI Yes Central venous access: Yes, needed Arterial line:   N/A Foley:  Yes, and it is still needed Mobility:  bed rest  PT consulted: N/A Last date of multidisciplinary goals of care discussion [Per primary team] Code Status:  full code Disposition: ICU  Labs   CBC: Recent Labs  Lab 08/17/20 1502 08/17/20 1505 08/18/20 0231 08/18/20 0854 08/18/20 0914  WBC  --  23.1* 24.9* 25.3*  --   NEUTROABS  --   --   --  23.5*  --   HGB 16.0 16.0 13.7 10.3* 10.2*  HCT 47.0 48.5 41.3 31.5* 30.0*  MCV  --  95.8 96.7 96.9  --   PLT  --  304 207 200  --     Basic Metabolic Panel: Recent Labs  Lab 08/17/20 1502 08/17/20 1505 08/18/20 0231 08/18/20 0854 08/18/20 0914  NA 140 139 141 136 140  K 4.0 3.9 5.0 4.4 4.5  CL 105 106 110 106  --   CO2  --  22 18* 22  --   GLUCOSE 126* 128* 127* 198*  --   BUN 13 11 15 18   --   CREATININE 1.00 1.03 1.10 1.32*  --   CALCIUM  --  8.1* 8.0* 7.5*  --   MG  --   --   --  1.8  --   PHOS  --   --   --  5.4*  --    GFR: Estimated Creatinine Clearance: 67.3 mL/min (A) (by C-G formula based on SCr of 1.32 mg/dL (H)). Recent Labs  Lab 08/17/20 1505 08/18/20 0231 08/18/20 0854  WBC 23.1* 24.9* 25.3*  LATICACIDVEN 5.2*  --  2.7*    Liver Function Tests: Recent Labs  Lab 08/17/20 1505  AST 37  ALT 24  ALKPHOS 67  BILITOT 0.8  PROT 5.6*  ALBUMIN 3.6   No results for input(s): LIPASE, AMYLASE in the last 168 hours. No results for input(s): AMMONIA in the last 168 hours.  ABG    Component Value Date/Time   PHART 7.253 (L) 08/18/2020 0914   PCO2ART 51.7 (H) 08/18/2020 0914   PO2ART 76 (L) 08/18/2020 0914   HCO3 23.2 08/18/2020 0914   TCO2 25 08/18/2020 0914   ACIDBASEDEF 5.0 (H) 08/18/2020 0914   O2SAT 94.0 08/18/2020 0914     Coagulation Profile: Recent Labs  Lab 08/17/20 1540  INR 1.2    Cardiac Enzymes: No results for input(s): CKTOTAL, CKMB, CKMBINDEX, TROPONINI in the last 168 hours.  HbA1C: No results found for: HGBA1C  CBG: Recent Labs  Lab 08/18/20 0832  GLUCAP  204*    Review of Systems:   Unable to obtain as patient is encephalopathic  Past Medical History:  He,  has a past medical history of BPH (benign prostatic hyperplasia) (08/17/2020), CAD (coronary artery disease) (08/17/2020), COPD (chronic obstructive pulmonary disease) (HCC) (08/17/2020), and HTN (hypertension) (08/17/2020).   Surgical History:  History reviewed. No pertinent surgical history.   Social History:   reports that he has been smoking. He does not have any smokeless tobacco history on file. He reports current alcohol use. He reports previous drug use.   Family History:  His family history is not on file.   Allergies No Known Allergies   Home Medications  Prior to Admission medications   Medication Sig Start Date End Date Taking? Authorizing Provider  albuterol (PROAIR HFA) 108 (90 Base) MCG/ACT inhaler Inhale 1-2 puffs into  the lungs every 4 (four) hours as needed for wheezing or shortness of breath.   Yes [provider]  albuterol (PROVENTIL) (2.5 MG/3ML) 0.083% nebulizer solution Take 2.5 mg by nebulization every 6 (six) hours as needed for wheezing or shortness of breath. 05/29/20  Yes [provider]  aspirin EC 81 MG tablet Take 81 mg by mouth daily. Swallow whole.   Yes [provider]  budesonide-formoterol (SYMBICORT) 160-4.5 MCG/ACT inhaler Inhale 2 puffs into the lungs 2 (two) times daily.   Yes [provider]  buPROPion (WELLBUTRIN XL) 300 MG 24 hr tablet Take 300 mg by mouth daily. 03/06/20  Yes [provider]  busPIRone (BUSPAR) 10 MG tablet Take 10 mg by mouth 3 (three) times daily. 07/30/20  Yes [provider]  metoprolol succinate (TOPROL-XL) 25 MG 24 hr tablet Take 12.5 mg by mouth daily. 07/13/20  Yes [provider]  montelukast (SINGULAIR) 10 MG tablet Take 10 mg by mouth at bedtime. 06/01/20  Yes [provider]  nitroGLYCERIN (NITROSTAT) 0.4 MG SL tablet Place 0.4 mg under the  tongue every 5 (five) minutes as needed for chest pain. 07/13/20  Yes [provider]  OXYGEN Inhale 2 L into the lungs as needed (shortness of breath).   Yes [provider]  tamsulosin (FLOMAX) 0.4 MG CAPS capsule Take 0.4 mg by mouth daily. 05/31/20  Yes [provider]  terazosin (HYTRIN) 2 MG capsule Take 2 mg by mouth daily. 07/02/20  Yes [provider]     Total critical care time: 55 minutes  Performed by: Cheri Fowler   Critical care time was exclusive of separately billable procedures and treating other patients.   Critical care was necessary to treat or prevent imminent or life-threatening deterioration.   Critical care was time spent personally by me on the following activities: development of treatment plan with patient and/or surrogate as well as nursing, discussions with consultants, evaluation of patient's response to treatment, examination of patient, obtaining history from patient or surrogate, ordering and performing treatments and interventions, ordering and review of laboratory studies, ordering and review of radiographic studies, pulse oximetry and re-evaluation of patient's condition.   Cheri Fowler MD [facility] See Amion for pager If no response to pager, please call [phone] until 7pm After 7pm, Please call E-link [phone]

## 2020-08-18 NOTE — Progress Notes (Addendum)
Progress Note: General Surgery Service   Chief Complaint/Subjective: Charles Weeks had an eventful morning.  His oxygen saturations dropped and he was becoming unstable, thankfully critical care quickly responded, intubated the patient, place a central line, started pressors.  He is now intubated sedated.  I discussed the plan of care with the patient's family at the bedside.  Objective: Vital signs in last 24 hours: Temp:  [97.5 F (36.4 C)-98 F (36.7 C)] 97.9 F (36.6 C) (04/02 0400) Pulse Rate:  [57-99] 99 (04/02 0735) Resp:  [17-30] 25 (04/02 0735) BP: (92-149)/(59-101) 149/81 (04/02 0600) SpO2:  [90 %-100 %] 99 % (04/02 0735) FiO2 (%):  [50 %-80 %] 80 % (04/02 0920) Weight:  [85.7 kg] 85.7 kg (04/01 1527) Last BM Date:  (pta)  Intake/Output from previous day: 04/01 0701 - 04/02 0700 In: 2885.8 [I.V.:2785.8; IV Piggyback:100] Out: -  Intake/Output this shift: No intake/output data recorded.  Constitutional: Intubated, sedated. Eyes: PERRL, bruising Neck: Trachea midline; no thyromegaly Lungs: Ventilated, bilateral breath sounds, right-sided chest tube in place CV: Tachycardic, normotensive but on pressor support GI: Abd old midline scar well-healed, abdomen soft, mild distention, no guarding; no palpable hepatosplenomegaly MSK: Extremities warm well perfused Psychiatric: Intubated sedated  lymphatic: No palpable cervical or axillary lymphadenopathy  Lab Results: CBC  Recent Labs    08/18/20 0231 08/18/20 0854 08/18/20 0914  WBC 24.9* 25.3*  --   HGB 13.7 10.3* 10.2*  HCT 41.3 31.5* 30.0*  PLT 207 200  --    BMET Recent Labs    08/17/20 1502 08/17/20 1505 08/18/20 0914  NA 140 139 140  K 4.0 3.9 4.5  CL 105 106  --   CO2  --  22  --   GLUCOSE 126* 128*  --   BUN 13 11  --   CREATININE 1.00 1.03  --   CALCIUM  --  8.1*  --    PT/INR Recent Labs    08/17/20 1540  LABPROT 14.4  INR 1.2   ABG Recent Labs    08/18/20 0914  PHART 7.253*  HCO3  23.2    Anti-infectives: Anti-infectives (From admission, onward)   Start     Dose/Rate Route Frequency Ordered Stop   08/17/20 1515  ceFAZolin (ANCEF) IVPB 2g/100 mL premix        2 g 200 mL/hr over 30 Minutes Intravenous  Once 08/17/20 1500 08/17/20 1530      Medications: Scheduled Meds: . acetaminophen  1,000 mg Oral Q6H  . bacitracin   Topical BID  . Chlorhexidine Gluconate Cloth  6 each Topical Daily  . docusate sodium  100 mg Oral BID  . etomidate  20 mg Intravenous Once  . fentaNYL      . fentaNYL (SUBLIMAZE) injection  50 mcg Intravenous Once  . methylPREDNISolone (SOLU-MEDROL) injection  40 mg Intravenous BID  . midazolam      . pantoprazole  40 mg Oral Daily   Or  . pantoprazole (PROTONIX) IV  40 mg Intravenous Daily  . tamsulosin  0.4 mg Oral Daily   Continuous Infusions: . sodium chloride    . albuterol    . dexmedetomidine (PRECEDEX) IV infusion 0.3 mcg/kg/hr (08/18/20 0600)  . lactated ringers 100 mL/hr at 08/18/20 0911  . norepinephrine (LEVOPHED) Adult infusion 4 mcg/min (08/18/20 0736)  . propofol (DIPRIVAN) infusion 5 mcg/kg/min (08/18/20 0906)   PRN Meds:.ipratropium-albuterol, metoprolol tartrate, morphine injection, ondansetron **OR** ondansetron (ZOFRAN) IV, oxyCODONE, oxyCODONE  Assessment/Plan: Charles Weeks is a 55 year old male who  presented on 08/17/2020 after a fall over a second floor banister down to the first floor next to the stairs.   Sister voiced concern for possible cocaine abuse, we will check a drug screen, unsure what might of caused this fall, will also check EKG and troponin. R PTX - CT in place on -20cm, morning chest x-ray shows good resolution of pneumothorax, continue chest tube to suction. Multiple B rib fxs - these are at the junction of the thoracic vertebral bodies, pain control, IS 3 column T8 vertebral body fx -appreciate Dr. Yetta Barre of neurosurgery assistance.  Unfortunately patient was too unstable this morning for surgery  and surgery was canceled for today, will need fixation as soon as his clinical course allows. T7 neural arch fx - per nsgy R TVP T4-8 - pain control, per nsgy R C7 TVP fx through foramen - C collar per Dr. Yetta Barre, CTA demonstrated no acute vascular injury to the neck Large scalp laceration/right eyebrow laceration - repair by Dr. Bedelia Person 08/17/2020, sutures need to be removed in 10 days Right neck abrasion/skin tear - bacitracin BID Facial ecchymosis - CT maxillofacial demonstrates no facial bone fractures.  Some anterior subluxation of the right mandibular condyle HTN - home lopressor 25mg  daily, hold now due to pressor support CAD/H/O MI/DES - on ASA at home.  Hold for now.  Monitor, on tele COPD gold - CCM consulted, appreciate assistance with patient decompensation this morning.  Ventilator support Depression/anxiety - home meds when available BPH - restart flomax FEN - NPO VTE - medical ppx on hold due to plans for spinal fixation, scds ID - Ancef in ED as well as tetanus Admit - inpatient, ICU  Critical care time - 35 minutes spent in patient care at bedside, reviewing imaging, as well as discussion with consultants.  , MD [facility] 08/18/20 9:33 AM  Please see Amion for pager number during day hours 7:00am-4:30pm or 7:00am -11:30am on weekends

## 2020-08-18 NOTE — Progress Notes (Signed)
Pt in severe acute resp distress this am, CCM at bedside ready to intubate pt. Surgery is cancelled, will repost for tomorrow 10am in the hopes this settles out and he's stable for ORIF

## 2020-08-18 NOTE — Progress Notes (Signed)
Per order, pt's ETT was advanced 2cm from 26 at the lip to 28cm at the lip. Pt tolerated well.

## 2020-08-18 NOTE — Procedures (Signed)
Intraosseous Needle Insertion Procedure Note    Date:08/18/20  Time:8:18 AM   Provider Performing:Korey Arroyo   Procedure: Insertion Intraosseous (23953)  Indication(s) Medication administration  Consent Unable to obtain consent due to emergent nature of procedure.  Anesthesia Topical only with 1% lidocaine   Timeout Verified patient identification, verified procedure, site/side was marked, verified correct patient position, special equipment/implants available, medications/allergies/relevant history reviewed, required imaging and test results available.  Procedure Description Area of needle insertion was cleaned with chlorhexidine. Intraosseous needle was placed into the right tibia. Bone marrow was aspirated and site easily flushed. The needle was secured in place and dressing applied.  Complications/Tolerance None; patient tolerated the procedure well.  EBL Minimal

## 2020-08-19 ENCOUNTER — Inpatient Hospital Stay (HOSPITAL_COMMUNITY): Payer: Medicaid Other

## 2020-08-19 ENCOUNTER — Encounter (HOSPITAL_COMMUNITY): Admission: EM | Disposition: A | Discharge status: Rehabilitation Facility | Payer: Self-pay | Source: Home / Self Care

## 2020-08-19 DIAGNOSIS — J939 Pneumothorax, unspecified: Secondary | ICD-10-CM | POA: Diagnosis not present

## 2020-08-19 DIAGNOSIS — T1490XA Injury, unspecified, initial encounter: Secondary | ICD-10-CM | POA: Diagnosis not present

## 2020-08-19 DIAGNOSIS — J9602 Acute respiratory failure with hypercapnia: Secondary | ICD-10-CM | POA: Diagnosis not present

## 2020-08-19 DIAGNOSIS — J9601 Acute respiratory failure with hypoxia: Secondary | ICD-10-CM | POA: Diagnosis not present

## 2020-08-19 LAB — GLUCOSE, CAPILLARY
Glucose-Capillary: 192 mg/dL — ABNORMAL HIGH (ref 70–99)
Glucose-Capillary: 196 mg/dL — ABNORMAL HIGH (ref 70–99)
Glucose-Capillary: 177 mg/dL — ABNORMAL HIGH (ref 70–99)
Glucose-Capillary: 143 mg/dL — ABNORMAL HIGH (ref 70–99)
Glucose-Capillary: 148 mg/dL — ABNORMAL HIGH (ref 70–99)

## 2020-08-19 LAB — COMPREHENSIVE METABOLIC PANEL
ALT: 38 U/L (ref 0–44)
AST: 49 U/L — ABNORMAL HIGH (ref 15–41)
Albumin: 2.6 g/dL — ABNORMAL LOW (ref 3.5–5.0)
Alkaline Phosphatase: 50 U/L (ref 38–126)
Anion gap: 8 (ref 5–15)
BUN: 16 mg/dL (ref 6–20)
CO2: 24 mmol/L (ref 22–32)
Calcium: 8 mg/dL — ABNORMAL LOW (ref 8.9–10.3)
Chloride: 104 mmol/L (ref 98–111)
Creatinine, Ser: 1.11 mg/dL (ref 0.61–1.24)
GFR, Estimated: >60 mL/min (ref >60)
Glucose, Bld: 216 mg/dL — ABNORMAL HIGH (ref 70–99)
Potassium: 4.1 mmol/L (ref 3.5–5.1)
Sodium: 136 mmol/L (ref 135–145)
Total Bilirubin: 1.9 mg/dL — ABNORMAL HIGH (ref 0.3–1.2)
Total Protein: 4.7 g/dL — ABNORMAL LOW (ref 6.5–8.1)

## 2020-08-19 LAB — HEMOGLOBIN A1C
Hgb A1c MFr Bld: 6.2 % — ABNORMAL HIGH (ref 4.8–5.6)
Mean Plasma Glucose: 131.24 mg/dL

## 2020-08-19 LAB — MAGNESIUM: Magnesium: 2.3 mg/dL (ref 1.7–2.4)

## 2020-08-19 LAB — SURGICAL PCR SCREEN
MRSA, PCR: NEGATIVE
Staphylococcus aureus: NEGATIVE

## 2020-08-19 LAB — PHOSPHORUS: Phosphorus: 3.1 mg/dL (ref 2.5–4.6)

## 2020-08-19 LAB — TRIGLYCERIDES: Triglycerides: 739 mg/dL — ABNORMAL HIGH (ref <150)

## 2020-08-19 SURGERY — POSTERIOR LUMBAR FUSION 3 LEVEL
Anesthesia: General | Site: Back

## 2020-08-19 MED ORDER — MIDAZOLAM BOLUS VIA INFUSION
0.0000 mg | INTRAVENOUS | Status: DC | PRN
  Administered 2020-08-19: 1 mg via INTRAVENOUS
  Administered 2020-08-20: 4 mg via INTRAVENOUS
  Administered 2020-08-20: 1 mg via INTRAVENOUS
  Filled 2020-08-19: qty 5

## 2020-08-19 MED ORDER — VECURONIUM BROMIDE 10 MG IV SOLR
INTRAVENOUS | Status: AC
  Filled 2020-08-19: qty 10

## 2020-08-19 MED ORDER — SODIUM CHLORIDE 0.9 % IV SOLN
1.0000 mg/kg/h | INTRAVENOUS | Status: DC
  Administered 2020-08-19 – 2020-08-20 (×5): 1 mg/kg/h via INTRAVENOUS
  Filled 2020-08-19 (×5): qty 5
  Filled 2020-08-19: qty 25
  Filled 2020-08-19 (×3): qty 5

## 2020-08-19 MED ORDER — FENTANYL 2500MCG IN NS 250ML (10MCG/ML) PREMIX INFUSION
0.0000 ug/h | INTRAVENOUS | Status: DC
  Administered 2020-08-19: 200 ug/h via INTRAVENOUS
  Administered 2020-08-19 – 2020-08-21 (×6): 400 ug/h via INTRAVENOUS
  Filled 2020-08-19 (×8): qty 250

## 2020-08-19 MED ORDER — DOCUSATE SODIUM 50 MG/5ML PO LIQD
100.0000 mg | Freq: Two times a day (BID) | ORAL | Status: DC
  Administered 2020-08-19 – 2020-09-23 (×47): 100 mg
  Filled 2020-08-19 (×49): qty 10

## 2020-08-19 MED ORDER — ALBUTEROL (5 MG/ML) CONTINUOUS INHALATION SOLN: 2.5000 mg/h | INHALATION_SOLUTION | RESPIRATORY_TRACT | Status: DC

## 2020-08-19 MED ORDER — ALBUTEROL SULFATE (2.5 MG/3ML) 0.083% IN NEBU
2.5000 mg | INHALATION_SOLUTION | RESPIRATORY_TRACT | Status: DC
  Administered 2020-08-20 – 2020-08-25 (×34): 2.5 mg via RESPIRATORY_TRACT
  Filled 2020-08-19 (×33): qty 3

## 2020-08-19 MED ORDER — ALBUTEROL SULFATE (2.5 MG/3ML) 0.083% IN NEBU
INHALATION_SOLUTION | RESPIRATORY_TRACT | Status: AC
  Administered 2020-08-19: 2.5 mg via RESPIRATORY_TRACT
  Filled 2020-08-19: qty 3

## 2020-08-19 MED ORDER — MUPIROCIN 2 % EX OINT
1.0000 "application " | TOPICAL_OINTMENT | Freq: Two times a day (BID) | CUTANEOUS | Status: AC
  Administered 2020-08-19 – 2020-08-23 (×10): 1 via NASAL
  Filled 2020-08-19: qty 22

## 2020-08-19 MED ORDER — SODIUM CHLORIDE 0.9 % IV SOLN
2.0000 g | INTRAVENOUS | Status: DC
  Administered 2020-08-19 – 2020-08-25 (×7): 2 g via INTRAVENOUS
  Filled 2020-08-19 (×4): qty 2
  Filled 2020-08-19: qty 20
  Filled 2020-08-19 (×2): qty 2

## 2020-08-19 MED ORDER — VECURONIUM BROMIDE 10 MG IV SOLR
10.0000 mg | Freq: Once | INTRAVENOUS | Status: AC
  Administered 2020-08-19: 10 mg via INTRAVENOUS

## 2020-08-19 MED ORDER — POLYETHYLENE GLYCOL 3350 17 G PO PACK
17.0000 g | PACK | Freq: Every day | ORAL | Status: DC
  Administered 2020-08-19 – 2020-08-22 (×4): 17 g
  Filled 2020-08-19 (×4): qty 1

## 2020-08-19 MED ORDER — INSULIN ASPART 100 UNIT/ML ~~LOC~~ SOLN
0.0000 [IU] | SUBCUTANEOUS | Status: DC
  Administered 2020-08-19 (×3): 3 [IU] via SUBCUTANEOUS
  Administered 2020-08-19 – 2020-08-20 (×4): 2 [IU] via SUBCUTANEOUS
  Administered 2020-08-20 (×2): 3 [IU] via SUBCUTANEOUS
  Administered 2020-08-20 (×2): 2 [IU] via SUBCUTANEOUS
  Administered 2020-08-21: 3 [IU] via SUBCUTANEOUS
  Administered 2020-08-21: 2 [IU] via SUBCUTANEOUS
  Administered 2020-08-21: 3 [IU] via SUBCUTANEOUS
  Administered 2020-08-21: 2 [IU] via SUBCUTANEOUS
  Administered 2020-08-21 – 2020-08-22 (×2): 3 [IU] via SUBCUTANEOUS
  Administered 2020-08-22 (×3): 2 [IU] via SUBCUTANEOUS
  Administered 2020-08-22 (×2): 3 [IU] via SUBCUTANEOUS
  Administered 2020-08-23 (×2): 2 [IU] via SUBCUTANEOUS
  Administered 2020-08-23 – 2020-08-24 (×6): 3 [IU] via SUBCUTANEOUS
  Administered 2020-08-24 (×2): 2 [IU] via SUBCUTANEOUS
  Administered 2020-08-24 – 2020-08-25 (×3): 3 [IU] via SUBCUTANEOUS
  Administered 2020-08-25: 2 [IU] via SUBCUTANEOUS
  Administered 2020-08-25 (×2): 3 [IU] via SUBCUTANEOUS
  Administered 2020-08-25: 2 [IU] via SUBCUTANEOUS
  Administered 2020-08-25: 3 [IU] via SUBCUTANEOUS
  Administered 2020-08-25 – 2020-08-26 (×2): 2 [IU] via SUBCUTANEOUS
  Administered 2020-08-26 (×2): 3 [IU] via SUBCUTANEOUS
  Administered 2020-08-26 (×2): 2 [IU] via SUBCUTANEOUS
  Administered 2020-08-27 (×6): 3 [IU] via SUBCUTANEOUS
  Administered 2020-08-28 (×6): 2 [IU] via SUBCUTANEOUS
  Administered 2020-08-28 – 2020-08-29 (×4): 3 [IU] via SUBCUTANEOUS
  Administered 2020-08-29: 2 [IU] via SUBCUTANEOUS
  Administered 2020-08-29 – 2020-08-30 (×2): 3 [IU] via SUBCUTANEOUS
  Administered 2020-08-30: 2 [IU] via SUBCUTANEOUS
  Administered 2020-08-30 – 2020-08-31 (×4): 3 [IU] via SUBCUTANEOUS
  Administered 2020-08-31 (×2): 2 [IU] via SUBCUTANEOUS
  Administered 2020-08-31: 3 [IU] via SUBCUTANEOUS
  Administered 2020-08-31 – 2020-09-01 (×4): 2 [IU] via SUBCUTANEOUS
  Administered 2020-09-01: 3 [IU] via SUBCUTANEOUS
  Administered 2020-09-01 – 2020-09-02 (×5): 2 [IU] via SUBCUTANEOUS
  Administered 2020-09-02: 3 [IU] via SUBCUTANEOUS
  Administered 2020-09-02 – 2020-09-04 (×6): 2 [IU] via SUBCUTANEOUS
  Administered 2020-09-04: 0 [IU] via SUBCUTANEOUS
  Administered 2020-09-04 – 2020-09-07 (×17): 2 [IU] via SUBCUTANEOUS
  Administered 2020-09-07: 3 [IU] via SUBCUTANEOUS
  Administered 2020-09-08: 2 [IU] via SUBCUTANEOUS
  Administered 2020-09-08: 3 [IU] via SUBCUTANEOUS
  Administered 2020-09-08 (×3): 2 [IU] via SUBCUTANEOUS
  Administered 2020-09-09: 3 [IU] via SUBCUTANEOUS
  Administered 2020-09-09: 0 [IU] via SUBCUTANEOUS
  Administered 2020-09-09 (×2): 2 [IU] via SUBCUTANEOUS
  Administered 2020-09-09 (×2): 3 [IU] via SUBCUTANEOUS
  Administered 2020-09-10 (×2): 2 [IU] via SUBCUTANEOUS
  Administered 2020-09-10: 3 [IU] via SUBCUTANEOUS
  Administered 2020-09-10 – 2020-09-11 (×5): 2 [IU] via SUBCUTANEOUS
  Administered 2020-09-12 (×3): 3 [IU] via SUBCUTANEOUS
  Administered 2020-09-12 – 2020-09-13 (×4): 2 [IU] via SUBCUTANEOUS
  Administered 2020-09-13: 3 [IU] via SUBCUTANEOUS
  Administered 2020-09-13: 2 [IU] via SUBCUTANEOUS
  Administered 2020-09-14: 3 [IU] via SUBCUTANEOUS
  Administered 2020-09-14 (×2): 2 [IU] via SUBCUTANEOUS
  Administered 2020-09-14: 3 [IU] via SUBCUTANEOUS
  Administered 2020-09-14 – 2020-09-16 (×11): 2 [IU] via SUBCUTANEOUS
  Administered 2020-09-16 – 2020-09-17 (×2): 3 [IU] via SUBCUTANEOUS
  Administered 2020-09-17 – 2020-09-18 (×6): 2 [IU] via SUBCUTANEOUS
  Administered 2020-09-18 (×2): 3 [IU] via SUBCUTANEOUS
  Administered 2020-09-18: 2 [IU] via SUBCUTANEOUS
  Administered 2020-09-18: 3 [IU] via SUBCUTANEOUS
  Administered 2020-09-18 – 2020-09-19 (×6): 2 [IU] via SUBCUTANEOUS
  Administered 2020-09-20: 3 [IU] via SUBCUTANEOUS
  Administered 2020-09-20: 2 [IU] via SUBCUTANEOUS
  Administered 2020-09-20: 3 [IU] via SUBCUTANEOUS
  Administered 2020-09-20 – 2020-09-22 (×8): 2 [IU] via SUBCUTANEOUS
  Administered 2020-09-22: 3 [IU] via SUBCUTANEOUS
  Administered 2020-09-22 – 2020-09-23 (×4): 2 [IU] via SUBCUTANEOUS
  Administered 2020-09-23: 3 [IU] via SUBCUTANEOUS
  Administered 2020-09-24 (×4): 2 [IU] via SUBCUTANEOUS
  Administered 2020-09-25: 3 [IU] via SUBCUTANEOUS
  Administered 2020-09-25 (×3): 2 [IU] via SUBCUTANEOUS
  Administered 2020-09-25: 3 [IU] via SUBCUTANEOUS
  Administered 2020-09-25 – 2020-09-26 (×5): 2 [IU] via SUBCUTANEOUS
  Administered 2020-09-26: 3 [IU] via SUBCUTANEOUS
  Administered 2020-09-27 (×5): 2 [IU] via SUBCUTANEOUS
  Administered 2020-09-28: 3 [IU] via SUBCUTANEOUS
  Administered 2020-09-28 – 2020-09-29 (×7): 2 [IU] via SUBCUTANEOUS
  Administered 2020-09-29: 3 [IU] via SUBCUTANEOUS
  Administered 2020-09-29 – 2020-09-30 (×4): 2 [IU] via SUBCUTANEOUS
  Administered 2020-09-30: 3 [IU] via SUBCUTANEOUS
  Administered 2020-10-01 (×4): 2 [IU] via SUBCUTANEOUS
  Administered 2020-10-02 – 2020-10-03 (×7): 3 [IU] via SUBCUTANEOUS
  Administered 2020-10-03: 2 [IU] via SUBCUTANEOUS
  Administered 2020-10-03 (×2): 3 [IU] via SUBCUTANEOUS
  Administered 2020-10-03 – 2020-10-04 (×4): 2 [IU] via SUBCUTANEOUS
  Administered 2020-10-04: 3 [IU] via SUBCUTANEOUS
  Administered 2020-10-04: 5 [IU] via SUBCUTANEOUS
  Administered 2020-10-04 – 2020-10-05 (×3): 2 [IU] via SUBCUTANEOUS
  Administered 2020-10-05: 3 [IU] via SUBCUTANEOUS
  Administered 2020-10-05 – 2020-10-06 (×4): 2 [IU] via SUBCUTANEOUS
  Administered 2020-10-06 (×3): 3 [IU] via SUBCUTANEOUS
  Administered 2020-10-07: 2 [IU] via SUBCUTANEOUS
  Administered 2020-10-07: 3 [IU] via SUBCUTANEOUS
  Administered 2020-10-07 (×4): 2 [IU] via SUBCUTANEOUS
  Administered 2020-10-08 (×2): 3 [IU] via SUBCUTANEOUS
  Administered 2020-10-08: 2 [IU] via SUBCUTANEOUS
  Administered 2020-10-08: 3 [IU] via SUBCUTANEOUS
  Administered 2020-10-08 – 2020-10-09 (×2): 2 [IU] via SUBCUTANEOUS
  Administered 2020-10-09 (×2): 3 [IU] via SUBCUTANEOUS
  Administered 2020-10-09: 2 [IU] via SUBCUTANEOUS

## 2020-08-19 MED ORDER — MIDAZOLAM 50MG/50ML (1MG/ML) PREMIX INFUSION
0.0000 mg/h | INTRAVENOUS | Status: DC
  Administered 2020-08-19: 2 mg/h via INTRAVENOUS
  Administered 2020-08-20: 4 mg/h via INTRAVENOUS
  Administered 2020-08-21: 2 mg/h via INTRAVENOUS
  Filled 2020-08-19 (×3): qty 50
  Filled 2020-08-19: qty 100

## 2020-08-19 NOTE — Progress Notes (Signed)
Patient desatted to 11s. Suctioned and gave o2 breath with no results. Began bagging and patient came back up to 90s. Dr. Merrily Pew came in to assess and ordered 10mg  Vecuronium IV push and made adjustments on vent. Patient now satting 90s. Will continue to monitor.
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
## 2020-08-19 NOTE — Progress Notes (Signed)
Patient's sister, [name], took all personal belongings home.

## 2020-08-19 NOTE — Progress Notes (Signed)
Trauma Service Note  Chief Complaint/Subjective: Continues to be pressor dependent, respiratory status stabilizing since intubation yesterday  Objective: Vital signs in last 24 hours: Temp:  [96.7 F (35.9 C)-99 F (37.2 C)] 98.3 F (36.8 C) (04/03 0800) Pulse Rate:  [51-102] 69 (04/03 0856) Resp:  [10-24] 22 (04/03 0856) BP: (52-152)/(38-92) 146/87 (04/03 0856) SpO2:  [90 %-100 %] 99 % (04/03 0856) FiO2 (%):  [40 %-60 %] 40 % (04/03 0856) Last BM Date:  (pta)  Intake/Output from previous day: 04/02 0701 - 04/03 0700 In: 6725.6 [I.V.:5090.3; IV Piggyback:1635.3] Out: 2385 [Urine:2085; Chest Tube:300] Intake/Output this shift: Total I/O In: 167.8 [I.V.:167.8] Out: -   General: sedated  Lungs: ventilated  Abd: distended  Extremities: no edema  Neuro: moves all extremities  Lab Results: CBC  Recent Labs    08/18/20 0854 08/18/20 0914 08/18/20 1318 08/18/20 1421  WBC 25.3*  --  23.2*  --   HGB 10.3*   < > 10.0* 9.9*  HCT 31.5*   < > 29.8* 29.0*  PLT 200  --  220  --    < > = values in this interval not displayed.   BMET Recent Labs    08/18/20 1318 08/18/20 1421 08/19/20 0445  NA 138 137 136  K 4.6 4.7 4.1  CL 108  --  104  CO2 24  --  24  GLUCOSE 206*  --  216*  BUN 20  --  16  CREATININE 1.52*  --  1.11  CALCIUM 7.6*  --  8.0*   PT/INR Recent Labs    08/17/20 1540  LABPROT 14.4  INR 1.2   ABG Recent Labs    08/18/20 0914 08/18/20 1421  PHART 7.253* 7.307*  HCO3 23.2 22.9    Studies/Results: ECHOCARDIOGRAM COMPLETE  Result Date: 08/18/2020    ECHOCARDIOGRAM REPORT   Patient Name:   TERELL KINCY Date of Exam: 08/18/2020 Medical Rec #:  [id number]      Height:       68.0 in Accession #:    [id number]     Weight:       189.0 lb Date of Birth:  19-Mar-1966     BSA:          1.995 m Patient Age:    22 years       BP:           149/81 mmHg Patient Gender: M              HR:           77 bpm. Exam Location:  Inpatient Procedure: 2D Echo,  Cardiac Doppler and Color Doppler                           STAT ECHO Reported to: Dr Rudean Haskell on 08/18/2020 11:38:00 AM. Indications:    Pericardial effusion  History:        Patient has no prior history of Echocardiogram examinations.                 CAD, COPD; Risk Factors:Hypertension.  Sonographer:    Clayton Lefort RDCS (AE) Referring Phys: [id number] Nickola Major STECHSCHULTE  Sonographer Comments: Technically difficult study due to poor echo windows, Technically challenging study due to limited acoustic windows, suboptimal apical window and echo performed with patient supine and on artificial respirator. Image acquisition challenging due to COPD. Apical images attempted from subcostal window due to lack of true apical window.  IMPRESSIONS  1. The pericardial effusion is circumferential and trivial; other measurements in the study include the right ventricle in the setting of RV enlargement and pericardial fat pad.  2. The inferior vena cava is normal in size with <50% respiratory variability, suggesting right atrial pressure of 8 mmHg.  3. Left ventricular ejection fraction, by estimation, is 55 to 60%. The left ventricle has normal function. The left ventricle has no regional wall motion abnormalities. There is mild concentric left ventricular hypertrophy. Left ventricular diastolic function could not be evaluated.  4. Right ventricular systolic function is normal. The right ventricular size is moderately enlarged.  5. The mitral valve is grossly normal. No evidence of mitral valve regurgitation.  6. The aortic valve was not well visualized. Aortic valve regurgitation is not visualized. Comparison(s): A prior study was performed on 01/31/20. Prior images reviewed side by side. From other MRN: right ventrice appears larger from prior. A pericardial fat pad was noted in this study and in the prior study as well. Study is not consistent with increased pericardial pressures. Technically difficult study. FINDINGS   Left Ventricle: Left ventricular ejection fraction, by estimation, is 55 to 60%. The left ventricle has normal function. The left ventricle has no regional wall motion abnormalities. The left ventricular internal cavity size was normal in size. There is  mild concentric left ventricular hypertrophy. Left ventricular diastolic function could not be evaluated. Right Ventricle: The right ventricular size is moderately enlarged. No increase in right ventricular wall thickness. Right ventricular systolic function is normal. Left Atrium: Left atrial size was normal in size. Right Atrium: Right atrial size was normal in size. Pericardium: Trivial pericardial effusion is present. The pericardial effusion is circumferential. Presence of pericardial fat pad. Mitral Valve: The mitral valve is grossly normal. No evidence of mitral valve regurgitation. Tricuspid Valve: The tricuspid valve is grossly normal. Tricuspid valve regurgitation is not demonstrated. Aortic Valve: The aortic valve was not well visualized. Aortic valve regurgitation is not visualized. Aortic valve mean gradient measures 1.0 mmHg. Aortic valve peak gradient measures 1.6 mmHg. Aortic valve area, by VTI measures 3.17 cm. Pulmonic Valve: The pulmonic valve was not well visualized. Pulmonic valve regurgitation is not visualized. No evidence of pulmonic stenosis. Aorta: The aortic root and ascending aorta are structurally normal, with no evidence of dilitation. Venous: The inferior vena cava is normal in size with less than 50% respiratory variability, suggesting right atrial pressure of 8 mmHg. IAS/Shunts: The atrial septum is grossly normal.  LEFT VENTRICLE PLAX 2D LVIDd:         4.30 cm LVIDs:         3.00 cm LV PW:         1.10 cm LV IVS:        1.10 cm LVOT diam:     2.10 cm LV SV:         34 LV SV Index:   17 LVOT Area:     3.46 cm  RIGHT VENTRICLE            IVC RV Basal diam:  3.10 cm    IVC diam: 1.70 cm RV S prime:     8.19 cm/s TAPSE (M-mode): 1.8  cm LEFT ATRIUM           Index       RIGHT ATRIUM           Index LA diam:      3.60 cm 1.80 cm/m  RA Area:  13.60 cm LA Vol (A4C): 26.6 ml 13.33 ml/m RA Volume:   31.50 ml  15.79 ml/m  AORTIC VALVE AV Area (Vmax):    2.83 cm AV Area (Vmean):   2.96 cm AV Area (VTI):     3.17 cm AV Vmax:           63.80 cm/s AV Vmean:          47.600 cm/s AV VTI:            0.107 m AV Peak Grad:      1.6 mmHg AV Mean Grad:      1.0 mmHg LVOT Vmax:         52.10 cm/s LVOT Vmean:        40.700 cm/s LVOT VTI:          0.098 m LVOT/AV VTI ratio: 0.92  AORTA Ao Root diam: 3.50 cm Ao Asc diam:  3.20 cm  SHUNTS Systemic VTI:  0.10 m Systemic Diam: 2.10 cm Rudean Haskell MD Electronically signed by Rudean Haskell MD Signature Date/Time: 08/18/2020/11:55:38 AM    Final     Anti-infectives: Anti-infectives (From admission, onward)   Start     Dose/Rate Route Frequency Ordered Stop   08/19/20 0845  cefTRIAXone (ROCEPHIN) 2 g in sodium chloride 0.9 % 100 mL IVPB        2 g 200 mL/hr over 30 Minutes Intravenous Every 24 hours 08/19/20 0753     08/17/20 1515  ceFAZolin (ANCEF) IVPB 2g/100 mL premix        2 g 200 mL/hr over 30 Minutes Intravenous  Once 08/17/20 1500 08/17/20 1530      Medications Scheduled Meds: . acetaminophen  1,000 mg Per Tube Q6H  . bacitracin   Topical BID  . chlorhexidine gluconate (MEDLINE KIT)  15 mL Mouth Rinse BID  . Chlorhexidine Gluconate Cloth  6 each Topical Daily  . docusate  100 mg Oral BID  . etomidate  20 mg Intravenous Once  . fentaNYL (SUBLIMAZE) injection  50 mcg Intravenous Once  . insulin aspart  0-15 Units Subcutaneous Q4H  . mouth rinse  15 mL Mouth Rinse 10 times per day  . methylPREDNISolone (SOLU-MEDROL) injection  40 mg Intravenous BID  . mupirocin ointment  1 application Nasal BID  . pantoprazole  40 mg Oral Daily   Or  . pantoprazole (PROTONIX) IV  40 mg Intravenous Daily  . tamsulosin  0.4 mg Oral Daily   Continuous Infusions: . sodium chloride  250 mL (08/19/20 0839)  . albuterol 2.5 mg/hr (08/18/20 1432)  . cefTRIAXone (ROCEPHIN)  IV 2 g (08/19/20 0902)  . dexmedetomidine (PRECEDEX) IV infusion Stopped (08/19/20 0852)  . fentaNYL infusion INTRAVENOUS 200 mcg/hr (08/19/20 0835)  . ketamine (KETALAR) Adult IV Infusion 1 mg/kg/hr (08/19/20 0851)  . lactated ringers 100 mL/hr at 08/19/20 0800  . norepinephrine (LEVOPHED) Adult infusion 10 mcg/min (08/19/20 0800)   PRN Meds:.ipratropium-albuterol, metoprolol tartrate, morphine injection, ondansetron **OR** ondansetron (ZOFRAN) IV, oxyCODONE, oxyCODONE  Assessment/Plan:  R PTX- CT in place on -20cm, chest x-ray shows good resolution of pneumothorax, continue chest tube to suction. Multiple B rib fxs- these are at the junction of the thoracic vertebral bodies, pain control, IS 3 column T8 vertebral body fx-appreciate Dr. Ronnald Ramp of neurosurgery assistance.  Unfortunately patient was too unstable this morning for surgery and surgery was canceled for today, will need fixation as soon as his clinical course allows. T7 neural arch fx- per nsgy R TVP T4-8 - pain  control, per nsgy R C7 TVP fx through foramen- C collar per Dr. Ronnald Ramp, CTA demonstrated no acute vascular injury to the neck Large scalp laceration/right eyebrow laceration- repair by Dr. Bobbye Morton 08/17/2020, sutures need to be removed in 10 days Right neck abrasion/skin tear- bacitracin BID Facial ecchymosis- CT maxillofacial demonstrates no facial bone fractures.  Some anterior subluxation of the right mandibular condyle HTN- home lopressor 28m daily, hold now due to pressor support CAD/H/O MI/DES- on ASA at home. Hold for now. Monitor, on tele COPD gold- CCM consulted, appreciate assistance with patient decompensation this morning.  Ventilator support Hypotension - on levophed, minimizing fluid due to COPD Depression/anxiety- home meds when available BPH- restart flomax FEN- NPO VTE- medical ppx on hold due to plans  for spinal fixation, scds ID- Ancef in ED as well as tetanus Admit -inpatient, ICU   LOS: 2 days   LLimaSurgeon ([phone] 08/19/2020

## 2020-08-19 NOTE — Progress Notes (Signed)
eLink Physician-Brief Progress Note Patient Name: Ryshawn Sanzone DOB: 1966/03/13 MRN: [id number]   Date of Service  08/19/2020  HPI/Events of Note  Called for ventilator dyssynchrony resulting in desats to 70s. Currently, patient is doing better and is saturating in the low 90s on PRVC 480x20, 10, 70%. He is only on fentanyl for sedation. Propofol was discontinued due to triglycerides of 739 today. RN asking about a paralytic. Patient not yet adequately sedated for this.   eICU Interventions  Ordered versed drip and IV pushes for sedation.  Assessment for paralytic only after getting this additional sedation on board.     Intervention Category Major Interventions: Respiratory failure - evaluation and management  Janae Bridgeman 08/19/2020, 8:39 PM

## 2020-08-19 NOTE — Progress Notes (Signed)
Spoke to [employer] from neurosurgery, they plan to complete surgery tomorrow evening. Patient to be NPO after midnight tonight.

## 2020-08-19 NOTE — Progress Notes (Signed)
I have spoken with his sister that he lives with at home.  She described the fall and her finding him on the floor.  He was able to crawl.  Have gone over the imaging with her.  I have spoken to the critical care medicine physician as well as the nurse.  He seems to be better from a hemodynamic standpoint.  He is still on Levophed and propofol.  They are going to convert him to ketamine.  CCM felt he may be stable enough for surgery today, but I do not feel comfortable after yesterday moving forward with a thoracic fusion for his fracture today.  It is not emergent, and he is laying flat in bed.  Therefore is not urgent that we move forward today.  I would like to see him stable for a few days before we put him through the stress of surgery.

## 2020-08-19 NOTE — Progress Notes (Signed)
Trauma Response Event Note   Event Summary:  Per RN, pt desaturating to 80-90's, dyssynchronous with the ventilator. Currently on Ketamine and Fentanyl. Breath sounds absent on right, and no air leak noted in chest tube system. Pt pulling <150cc of TV. CXR obtained, stable with devices noted. FiO2 increased to 70% from 50% and Peep at 10. TRN irrigated chest tube per Trauma MD. Consulting CCM doc added Versed gtt with PRN pushes. Sats and HR improved. Will continue to follow.   MD Notified: Dr. Sheliah Hatch Call Time: 2040 Arrival Time:2053    Elpidio Galea, RN

## 2020-08-19 NOTE — Progress Notes (Signed)
NAME:  Josejulian Tarango, MRN:  [id number], DOB:  [birth year], LOS: 2 ADMISSION DATE:  08/17/2020, CONSULTATION DATE: 08/18/2020 REFERRING MD:  Quentin Ore, CHIEF COMPLAINT: Shortness of breath  History of Present Illness:  55 year old male with COPD, coronary artery disease status post stent who presented status post fall from his chair, noted to have multiple rib fractures, right pneumothorax and vertebral body fractures, admitted under trauma service and neurosurgery consulting. This morning patient was noted to be short of breath, became hypoxic with labored breathing, PCCM was consulted, upon evaluation patient was noted to be belly breathing, with minimal air entry and prolonged expiratory phase, decision was made to intubate for acute hypoxic/hypercapnic respiratory failure.  Pertinent  Medical History   Past Medical History:  Diagnosis Date  . BPH (benign prostatic hyperplasia) 08/17/2020  . CAD (coronary artery disease) 08/17/2020  . COPD (chronic obstructive pulmonary disease) (HCC) 08/17/2020   gold  . HTN (hypertension) 08/17/2020     Significant Hospital Events: Including procedures, antibiotic start and stop dates in addition to other pertinent events   . 4/1 admitted . 4/2 ETT/right subclavian CVC  Interim History / Subjective:  Intubated and placed on mechanical ventilation  Objective   Blood pressure (!) 146/87, pulse 69, temperature 98.3 F (36.8 C), temperature source Axillary, resp. rate (!) 22, height 5\' 8"  (1.727 m), weight 85.7 kg, SpO2 99 %. CVP:  [9 mmHg-31 mmHg] 31 mmHg  Vent Mode: PRVC FiO2 (%):  [40 %-60 %] 40 % Set Rate:  [22 bmp] 22 bmp Vt Set:  [480 mL] 480 mL PEEP:  [10 cmH20] 10 cmH20 Plateau Pressure:  [17 cmH20-24 cmH20] 24 cmH20   Intake/Output Summary (Last 24 hours) at 08/19/2020 10/19/2020 Last data filed at 08/19/2020 0800 Gross per 24 hour  Intake 5837.24 ml  Output 2385 ml  Net 3452.24 ml   Filed Weights   08/17/20 1527  Weight: 85.7  kg    Examination:   Physical exam: General: Acutely ill-appearing male, orally intubated HEENT: [place]/AT, eyes anicteric, swollen eyelid bilaterally.  ETT and OGT in place.  Aspen neck collar in place Neuro: Sedated, not following commands.  Eyes are closed.  Pupils 3 mm bilateral reactive to light Chest: Prolonged expiratory wheezes, bilateral wheezes, no rhonchi or crackles. chest tube on the right Heart: Regular rate and rhythm, no murmurs or gallops Abdomen: Soft, nontender, nondistended, bowel sounds present Skin: No rash  Labs/imaging that I havepersonally reviewed  (right click and "Reselect all SmartList Selections" daily)  Lactate 2.7, serum triglyceride level 739  Resolved Hospital Problem list     Assessment & Plan:  Acute hypoxic/hypercapnic respiratory failure due to acute COPD exacerbation Acute mixed metabolic and respiratory acidosis, improving Acute metabolic encephalopathy due to CO2 narcosis Acute traumatic right-sided pneumothorax Multiple rib fractures Multiple vertebral fractures Large scalp laceration Hypertension Coronary artery disease Lactic acidosis Acute kidney injury, likely due to dehydration Hypertriglyceridemia  Continue lung protective ventilation, vent setting was adjusted with decreasing FiO2 Trend ABGs Continue continuous albuterol nebs Continue IV steroid Solu-Medrol 40 mg twice daily for 5 days Started on IV ceftriaxone for severe COPD exacerbation Currently on ketamine and fentanyl infusion Propofol was stopped because of hypertriglyceridemia S/p chest tube, trauma surgery is following Patient will go to the OR for vertebral surgery, defer to trauma and neurosurgery Patient serum creatinine is trending down now it is 1.1, continue to monitor Lactic acid is trending down  PCCM will continue to follow along, please call with question  Best  practice (right click and "Reselect all SmartList Selections" daily)  Diet:  Tube Feed   Pain/Anxiety/Delirium protocol (if indicated): Yes (RASS goal -1,-2) VAP protocol (if indicated): Yes DVT prophylaxis: SCD GI prophylaxis: PPI Glucose control:  SSI Yes Central venous access: Yes, needed Arterial line:  N/A Foley:  Yes, and it is still needed Mobility:  bed rest  PT consulted: N/A Last date of multidisciplinary goals of care discussion [Per primary team] Code Status:  full code Disposition: ICU  Labs   CBC: Recent Labs  Lab 08/17/20 1505 08/18/20 0231 08/18/20 0854 08/18/20 0914 08/18/20 1318 08/18/20 1421  WBC 23.1* 24.9* 25.3*  --  23.2*  --   NEUTROABS  --   --  23.5*  --   --   --   HGB 16.0 13.7 10.3* 10.2* 10.0* 9.9*  HCT 48.5 41.3 31.5* 30.0* 29.8* 29.0*  MCV 95.8 96.7 96.9  --  95.8  --   PLT 304 207 200  --  220  --     Basic Metabolic Panel: Recent Labs  Lab 08/17/20 1505 08/18/20 0231 08/18/20 0854 08/18/20 0914 08/18/20 1318 08/18/20 1421 08/19/20 0445  NA 139 141 136 140 138 137 136  K 3.9 5.0 4.4 4.5 4.6 4.7 4.1  CL 106 110 106  --  108  --  104  CO2 22 18* 22  --  24  --  24  GLUCOSE 128* 127* 198*  --  206*  --  216*  BUN 11 15 18   --  20  --  16  CREATININE 1.03 1.10 1.32*  --  1.52*  --  1.11  CALCIUM 8.1* 8.0* 7.5*  --  7.6*  --  8.0*  MG  --   --  1.8  --   --   --  2.3  PHOS  --   --  5.4*  --   --   --  3.1   GFR: Estimated Creatinine Clearance: 80.1 mL/min (by C-G formula based on SCr of 1.11 mg/dL). Recent Labs  Lab 08/17/20 1505 08/18/20 0231 08/18/20 0854 08/18/20 1318  WBC 23.1* 24.9* 25.3* 23.2*  LATICACIDVEN 5.2*  --  2.7*  --     Liver Function Tests: Recent Labs  Lab 08/17/20 1505 08/19/20 0445  AST 37 49*  ALT 24 38  ALKPHOS 67 50  BILITOT 0.8 1.9*  PROT 5.6* 4.7*  ALBUMIN 3.6 2.6*   No results for input(s): LIPASE, AMYLASE in the last 168 hours. No results for input(s): AMMONIA in the last 168 hours.  ABG    Component Value Date/Time   PHART 7.307 (L) 08/18/2020 1421   PCO2ART 45.4  08/18/2020 1421   PO2ART 134 (H) 08/18/2020 1421   HCO3 22.9 08/18/2020 1421   TCO2 24 08/18/2020 1421   ACIDBASEDEF 4.0 (H) 08/18/2020 1421   O2SAT 99.0 08/18/2020 1421     Coagulation Profile: Recent Labs  Lab 08/17/20 1540  INR 1.2    Cardiac Enzymes: No results for input(s): CKTOTAL, CKMB, CKMBINDEX, TROPONINI in the last 168 hours.  HbA1C: No results found for: HGBA1C  CBG: Recent Labs  Lab 08/18/20 0832 08/19/20 0849  GLUCAP 204* 192*    Total critical care time: 36 minutes  Performed by: 10/19/20   Critical care time was exclusive of separately billable procedures and treating other patients.   Critical care was necessary to treat or prevent imminent or life-threatening deterioration.   Critical care was time spent personally by me on  the following activities: development of treatment plan with patient and/or surrogate as well as nursing, discussions with consultants, evaluation of patient's response to treatment, examination of patient, obtaining history from patient or surrogate, ordering and performing treatments and interventions, ordering and review of laboratory studies, ordering and review of radiographic studies, pulse oximetry and re-evaluation of patient's condition.   Cheri Fowler MD [facility] See Amion for pager If no response to pager, please call [phone] until 7pm After 7pm, Please call E-link [phone]

## 2020-08-20 ENCOUNTER — Encounter (HOSPITAL_COMMUNITY): Admission: EM | Disposition: A | Discharge status: Rehabilitation Facility | Payer: Self-pay | Source: Home / Self Care

## 2020-08-20 DIAGNOSIS — J942 Hemothorax: Secondary | ICD-10-CM | POA: Diagnosis not present

## 2020-08-20 DIAGNOSIS — J939 Pneumothorax, unspecified: Secondary | ICD-10-CM | POA: Diagnosis not present

## 2020-08-20 DIAGNOSIS — J9601 Acute respiratory failure with hypoxia: Secondary | ICD-10-CM | POA: Diagnosis not present

## 2020-08-20 DIAGNOSIS — T1490XA Injury, unspecified, initial encounter: Secondary | ICD-10-CM | POA: Diagnosis not present

## 2020-08-20 LAB — POCT I-STAT 7, (LYTES, BLD GAS, ICA,H+H)
Acid-Base Excess: 5 mmol/L — ABNORMAL HIGH (ref 0.0–2.0)
Bicarbonate: 31.4 mmol/L — ABNORMAL HIGH (ref 20.0–28.0)
Calcium, Ion: 1.18 mmol/L (ref 1.15–1.40)
HCT: 21 % — ABNORMAL LOW (ref 39.0–52.0)
Hemoglobin: 7.1 g/dL — ABNORMAL LOW (ref 13.0–17.0)
O2 Saturation: 93 %
Patient temperature: 99.2
Potassium: 3.7 mmol/L (ref 3.5–5.1)
Sodium: 145 mmol/L (ref 135–145)
TCO2: 33 mmol/L — ABNORMAL HIGH (ref 22–32)
pCO2 arterial: 55.9 mmHg — ABNORMAL HIGH (ref 32.0–48.0)
pH, Arterial: 7.359 (ref 7.350–7.450)
pO2, Arterial: 73 mmHg — ABNORMAL LOW (ref 83.0–108.0)

## 2020-08-20 LAB — GLUCOSE, CAPILLARY
Glucose-Capillary: 133 mg/dL — ABNORMAL HIGH (ref 70–99)
Glucose-Capillary: 138 mg/dL — ABNORMAL HIGH (ref 70–99)
Glucose-Capillary: 142 mg/dL — ABNORMAL HIGH (ref 70–99)
Glucose-Capillary: 144 mg/dL — ABNORMAL HIGH (ref 70–99)
Glucose-Capillary: 153 mg/dL — ABNORMAL HIGH (ref 70–99)
Glucose-Capillary: 159 mg/dL — ABNORMAL HIGH (ref 70–99)

## 2020-08-20 LAB — TRIGLYCERIDES: Triglycerides: 265 mg/dL — ABNORMAL HIGH (ref <150)

## 2020-08-20 SURGERY — POSTERIOR LUMBAR FUSION 3 LEVEL
Anesthesia: General | Site: Back

## 2020-08-20 MED ORDER — PIVOT 1.5 CAL PO LIQD
1000.0000 mL | ORAL | Status: DC
  Administered 2020-08-20 – 2020-09-02 (×17): 1000 mL

## 2020-08-20 MED ORDER — ARTIFICIAL TEARS OPHTHALMIC OINT
TOPICAL_OINTMENT | OPHTHALMIC | Status: DC | PRN
  Administered 2020-08-20 – 2020-08-30 (×2): 1 via OPHTHALMIC
  Filled 2020-08-20: qty 3.5

## 2020-08-20 MED ORDER — SODIUM CHLORIDE 0.9 % IV BOLUS
1000.0000 mL | Freq: Once | INTRAVENOUS | Status: AC
  Administered 2020-08-20: 1000 mL via INTRAVENOUS

## 2020-08-20 MED ORDER — PROPOFOL 1000 MG/100ML IV EMUL
5.0000 ug/kg/min | INTRAVENOUS | Status: DC
  Administered 2020-08-20: 5 ug/kg/min via INTRAVENOUS
  Administered 2020-08-21: 30 ug/kg/min via INTRAVENOUS
  Administered 2020-08-21: 35 ug/kg/min via INTRAVENOUS
  Administered 2020-08-21: 30 ug/kg/min via INTRAVENOUS
  Administered 2020-08-22: 20 ug/kg/min via INTRAVENOUS
  Administered 2020-08-22: 15 ug/kg/min via INTRAVENOUS
  Administered 2020-08-22: 25 ug/kg/min via INTRAVENOUS
  Administered 2020-08-22 – 2020-08-23 (×3): 20 ug/kg/min via INTRAVENOUS
  Administered 2020-08-24: 25 ug/kg/min via INTRAVENOUS
  Administered 2020-08-24: 15 ug/kg/min via INTRAVENOUS
  Administered 2020-08-24 – 2020-08-25 (×2): 25 ug/kg/min via INTRAVENOUS
  Administered 2020-08-25: 20 ug/kg/min via INTRAVENOUS
  Administered 2020-08-25: 30 ug/kg/min via INTRAVENOUS
  Administered 2020-08-26: 20 ug/kg/min via INTRAVENOUS
  Administered 2020-08-26 – 2020-08-27 (×2): 15 ug/kg/min via INTRAVENOUS
  Administered 2020-08-28 – 2020-08-29 (×2): 10 ug/kg/min via INTRAVENOUS
  Administered 2020-08-30: 5 ug/kg/min via INTRAVENOUS
  Administered 2020-08-31: 7.5 ug/kg/min via INTRAVENOUS
  Filled 2020-08-20 (×25): qty 100

## 2020-08-20 MED ORDER — SODIUM CHLORIDE 0.9 % IV SOLN: INTRAVENOUS | Status: DC | PRN

## 2020-08-20 NOTE — Progress Notes (Signed)
NAME:  Charles Weeks, MRN:  [id number], DOB:  1966/04/13, LOS: 3 ADMISSION DATE:  08/17/2020, CONSULTATION DATE: 08/18/2020 REFERRING MD:  Quentin Ore, CHIEF COMPLAINT: Shortness of breath  History of Present Illness:  55 year old male with COPD, coronary artery disease status post stent who presented status post fall from his chair, noted to have multiple rib fractures, right pneumothorax and vertebral body fractures, admitted under trauma service and neurosurgery consulting. This morning patient was noted to be short of breath, became hypoxic with labored breathing, PCCM was consulted, upon evaluation patient was noted to be belly breathing, with minimal air entry and prolonged expiratory phase, decision was made to intubate for acute hypoxic/hypercapnic respiratory failure.  Pertinent  Medical History   Past Medical History:  Diagnosis Date  . BPH (benign prostatic hyperplasia) 08/17/2020  . CAD (coronary artery disease) 08/17/2020  . COPD (chronic obstructive pulmonary disease) (HCC) 08/17/2020   gold  . HTN (hypertension) 08/17/2020     Significant Hospital Events: Including procedures, antibiotic start and stop dates in addition to other pertinent events   . 4/1 admitted . 4/2 ETT/right subclavian CVC  Interim History / Subjective:  Patient was agitated, dyssynchronous with the vent that led to desaturation overnight, requiring increasing FiO2 requirement.  He was started on IV Versed infusion with improvement in vent synchrony and FiO2 requirement  Objective   Blood pressure 120/73, pulse 84, temperature 99.2 F (37.3 C), temperature source Axillary, resp. rate 20, height 5\' 8"  (1.727 m), weight 85.7 kg, SpO2 96 %.    Vent Mode: PRVC FiO2 (%):  [40 %-70 %] 40 % Set Rate:  [20 bmp-22 bmp] 20 bmp Vt Set:  [480 mL] 480 mL PEEP:  [10 cmH20] 10 cmH20 Plateau Pressure:  [17 cmH20-27 cmH20] 22 cmH20   Intake/Output Summary (Last 24 hours) at 08/20/2020 0847 Last data  filed at 08/20/2020 0600 Gross per 24 hour  Intake 3898.75 ml  Output 3290 ml  Net 608.75 ml   Filed Weights   08/17/20 1527  Weight: 85.7 kg    Examination:   Physical exam: General: Acutely ill-appearing male, orally intubated HEENT: [place]/AT, eyes anicteric, swollen eyelid bilaterally.  ETT and OGT in place.  Aspen neck collar in place Neuro: Deeply sedated, not following commands.  Eyes are closed.  Pupils 3 mm bilateral reactive to light Chest: Prolonged expiratory phase, bilateral wheezes, no rhonchi or crackles. chest tube on the right Heart: Regular rate and rhythm, no murmurs or gallops Abdomen: Soft, nontender, nondistended, bowel sounds present Skin: No rash  Labs/imaging that I havepersonally reviewed  (right click and "Reselect all SmartList Selections" daily)  serum triglyceride level 265  Resolved Hospital Problem list     Assessment & Plan:  Acute hypoxic/hypercapnic respiratory failure due to acute COPD exacerbation Acute mixed metabolic and respiratory acidosis, improving Acute metabolic/Toxic encephalopathy due to CO2 narcosis Acute traumatic right-sided pneumothorax, s/p chest tube Multiple rib fractures Multiple vertebral fractures Large scalp laceration Hypertension Coronary artery disease Lactic acidosis Acute kidney injury, likely due to dehydration Hypertriglyceridemia  Continue lung protective ventilation, vent setting was adjusted, now FiO2 40% with PEEP 10 Trend ABGs Continue continuous albuterol nebs Continue IV steroid Solu-Medrol 40 mg twice daily for 5 days Continue IV ceftriaxone for severe COPD exacerbation Currently on ketamine, versed and fentanyl infusion for vent synchrony  Off propofol, TG level came down to 265 from 739 S/p chest tube, trauma surgery is following Patient will go to the OR for vertebral surgery, defer to trauma  and neurosurgery Patient serum creatinine has improved  continue to monitor Lactic acidosis  improved  PCCM will continue to follow along, please call with question  Best practice (right click and "Reselect all SmartList Selections" daily)  Diet:  Tube Feed  Pain/Anxiety/Delirium protocol (if indicated): Yes VAP protocol (if indicated): Yes DVT prophylaxis: SCD GI prophylaxis: PPI Glucose control:  SSI Yes Central venous access: Yes, needed Arterial line:  N/A Foley:  Yes, and it is still needed Mobility:  bed rest  PT consulted: N/A Last date of multidisciplinary goals of care discussion [Per primary team] Code Status:  full code Disposition: ICU  Labs   CBC: Recent Labs  Lab 08/17/20 1505 08/18/20 0231 08/18/20 0854 08/18/20 0914 08/18/20 1318 08/18/20 1421  WBC 23.1* 24.9* 25.3*  --  23.2*  --   NEUTROABS  --   --  23.5*  --   --   --   HGB 16.0 13.7 10.3* 10.2* 10.0* 9.9*  HCT 48.5 41.3 31.5* 30.0* 29.8* 29.0*  MCV 95.8 96.7 96.9  --  95.8  --   PLT 304 207 200  --  220  --     Basic Metabolic Panel: Recent Labs  Lab 08/17/20 1505 08/18/20 0231 08/18/20 0854 08/18/20 0914 08/18/20 1318 08/18/20 1421 08/19/20 0445  NA 139 141 136 140 138 137 136  K 3.9 5.0 4.4 4.5 4.6 4.7 4.1  CL 106 110 106  --  108  --  104  CO2 22 18* 22  --  24  --  24  GLUCOSE 128* 127* 198*  --  206*  --  216*  BUN 11 15 18   --  20  --  16  CREATININE 1.03 1.10 1.32*  --  1.52*  --  1.11  CALCIUM 8.1* 8.0* 7.5*  --  7.6*  --  8.0*  MG  --   --  1.8  --   --   --  2.3  PHOS  --   --  5.4*  --   --   --  3.1   GFR: Estimated Creatinine Clearance: 80.1 mL/min (by C-G formula based on SCr of 1.11 mg/dL). Recent Labs  Lab 08/17/20 1505 08/18/20 0231 08/18/20 0854 08/18/20 1318  WBC 23.1* 24.9* 25.3* 23.2*  LATICACIDVEN 5.2*  --  2.7*  --     Liver Function Tests: Recent Labs  Lab 08/17/20 1505 08/19/20 0445  AST 37 49*  ALT 24 38  ALKPHOS 67 50  BILITOT 0.8 1.9*  PROT 5.6* 4.7*  ALBUMIN 3.6 2.6*   No results for input(s): LIPASE, AMYLASE in the last 168  hours. No results for input(s): AMMONIA in the last 168 hours.  ABG    Component Value Date/Time   PHART 7.307 (L) 08/18/2020 1421   PCO2ART 45.4 08/18/2020 1421   PO2ART 134 (H) 08/18/2020 1421   HCO3 22.9 08/18/2020 1421   TCO2 24 08/18/2020 1421   ACIDBASEDEF 4.0 (H) 08/18/2020 1421   O2SAT 99.0 08/18/2020 1421     Coagulation Profile: Recent Labs  Lab 08/17/20 1540  INR 1.2    Cardiac Enzymes: No results for input(s): CKTOTAL, CKMB, CKMBINDEX, TROPONINI in the last 168 hours.  HbA1C: Hgb A1c MFr Bld  Date/Time Value Ref Range Status  08/19/2020 09:15 AM 6.2 (H) 4.8 - 5.6 % Final    Comment:    (NOTE) Pre diabetes:          5.7%-6.4%  Diabetes:              >  6.4%  Glycemic control for   <7.0% adults with diabetes     CBG: Recent Labs  Lab 08/19/20 1539 08/19/20 1933 08/19/20 2316 08/20/20 0350 08/20/20 0759  GLUCAP 177* 143* 148* 159* 144*    Total critical care time: 35 minutes  Performed by: Cheri Fowler   Critical care time was exclusive of separately billable procedures and treating other patients.   Critical care was necessary to treat or prevent imminent or life-threatening deterioration.   Critical care was time spent personally by me on the following activities: development of treatment plan with patient and/or surrogate as well as nursing, discussions with consultants, evaluation of patient's response to treatment, examination of patient, obtaining history from patient or surrogate, ordering and performing treatments and interventions, ordering and review of laboratory studies, ordering and review of radiographic studies, pulse oximetry and re-evaluation of patient's condition.   Cheri Fowler MD [facility] See Amion for pager If no response to pager, please call [phone] until 7pm After 7pm, Please call E-link [phone]

## 2020-08-20 NOTE — Progress Notes (Signed)
Trauma/Critical Care Follow Up Note  Subjective:    Overnight Issues:   Objective:  Vital signs for last 24 hours: Temp:  [98.8 F (37.1 C)-100 F (37.8 C)] 99.4 F (37.4 C) (04/04 1200) Pulse Rate:  [69-134] 87 (04/04 1147) Resp:  [14-23] 18 (04/04 1147) BP: (112-159)/(61-87) 119/68 (04/04 1147) SpO2:  [88 %-100 %] 93 % (04/04 1147) Arterial Line BP: (146)/(57) 146/57 (04/04 1100) FiO2 (%):  [40 %-70 %] 40 % (04/04 1147)  Hemodynamic parameters for last 24 hours:    Intake/Output from previous day: 04/03 0701 - 04/04 0700 In: 4066.5 [I.V.:3966.5; IV Piggyback:100] Out: 3290 [Urine:2300; Emesis/NG output:750; Chest Tube:240]  Intake/Output this shift: Total I/O In: 598 [I.V.:497.9; IV Piggyback:100.1] Out: 420 [Urine:420]  Vent settings for last 24 hours: Vent Mode: PRVC FiO2 (%):  [40 %-70 %] 40 % Set Rate:  [18 bmp-20 bmp] 18 bmp Vt Set:  [480 mL] 480 mL PEEP:  [10 cmH20] 10 cmH20 Plateau Pressure:  [22 cmH20-27 cmH20] 22 cmH20  Physical Exam:  Gen: comfortable, no distress Neuro: sedated HEENT: PERRL Neck: c-collar CV: RRR Pulm: unlabored breathing Abd: soft, NT GU: clear yellow urine Extr: wwp, no edema   Results for orders placed or performed during the hospital encounter of 08/17/20 (from the past 24 hour(s))  Glucose, capillary     Status: Abnormal   Collection Time: 08/19/20  3:39 PM  Result Value Ref Range   Glucose-Capillary 177 (H) 70 - 99 mg/dL  Glucose, capillary     Status: Abnormal   Collection Time: 08/19/20  7:33 PM  Result Value Ref Range   Glucose-Capillary 143 (H) 70 - 99 mg/dL  Glucose, capillary     Status: Abnormal   Collection Time: 08/19/20 11:16 PM  Result Value Ref Range   Glucose-Capillary 148 (H) 70 - 99 mg/dL  Glucose, capillary     Status: Abnormal   Collection Time: 08/20/20  3:50 AM  Result Value Ref Range   Glucose-Capillary 159 (H) 70 - 99 mg/dL  Triglycerides     Status: Abnormal   Collection Time: 08/20/20   4:39 AM  Result Value Ref Range   Triglycerides 265 (H) <150 mg/dL  Glucose, capillary     Status: Abnormal   Collection Time: 08/20/20  7:59 AM  Result Value Ref Range   Glucose-Capillary 144 (H) 70 - 99 mg/dL  I-STAT 7, (LYTES, BLD GAS, ICA, H+H)     Status: Abnormal   Collection Time: 08/20/20 10:39 AM  Result Value Ref Range   pH, Arterial 7.359 7.350 - 7.450   pCO2 arterial 55.9 (H) 32.0 - 48.0 mmHg   pO2, Arterial 73 (L) 83.0 - 108.0 mmHg   Bicarbonate 31.4 (H) 20.0 - 28.0 mmol/L   TCO2 33 (H) 22 - 32 mmol/L   O2 Saturation 93.0 %   Acid-Base Excess 5.0 (H) 0.0 - 2.0 mmol/L   Sodium 145 135 - 145 mmol/L   Potassium 3.7 3.5 - 5.1 mmol/L   Calcium, Ion 1.18 1.15 - 1.40 mmol/L   HCT 21.0 (L) 39.0 - 52.0 %   Hemoglobin 7.1 (L) 13.0 - 17.0 g/dL   Patient temperature 45.8 F    Collection site art line    Drawn by RT    Sample type ARTERIAL   Glucose, capillary     Status: Abnormal   Collection Time: 08/20/20 11:59 AM  Result Value Ref Range   Glucose-Capillary 138 (H) 70 - 99 mg/dL    Assessment & Plan: The  plan of care was discussed with the bedside nurse for the day, who is in agreement with this plan and no additional concerns were raised.   Present on Admission: . Thoracic spine fracture (HCC)    LOS: 3 days   Additional comments:I reviewed the patient's new clinical lab test results.   and I reviewed the patients new imaging test results.    R PTX- CT in place on -20cm,chest x-ray shows good resolution of pneumothorax, continue chest tube to suction. Multiple B rib fxs- these are at the junction of the thoracic vertebral bodies, pain control, IS 3 column T8 vertebral body fx- NSGY c/s, Dr. Yetta Barre, canceled for today, given instability, is considering non-operative management depending on clinical course.  T7 neural arch fx- per nsgy R TVP T4-8 - pain control, per nsgy R C7 TVP fx through foramen- C collar per Dr. Yetta Barre, CTAnegatvie Large scalp  laceration/right eyebrow laceration- repair by Dr. Lovick4/05/2020, sutures need to be removed in 10 days Right neck abrasion/skin tear- bacitracin BID Facial ecchymosis- CT maxillofacialdemonstrates no facial bone fractures. Some anterior subluxation of the right mandibular condyle HTN- home lopressor 25mg  daily, hold now due topressor support CAD/H/O MI/DES- on ASA at home. Hold for now. Monitor, on tele COPD gold-CCM consulted, appreciate assistance  Hypotension - on levophed, place art line, wean as tolerated Depression/anxiety- home meds when available BPH- restart flomax FEN-NPO VTE-medical ppxon hold due toplans for spinal fixation, scds Dispo - ICU  Clincal update provided to sister at bedside. All questions answered to her satisfaction.  Critical Care Total Time: 60 minutes  , MD Trauma & General Surgery Please use [email or web address] to contact on call provider  08/20/2020  *Care during the described time interval was provided by me. I have reviewed this patient's available data, including medical history, events of note, physical examination and test results as part of my evaluation.

## 2020-08-20 NOTE — Progress Notes (Signed)
Initial Nutrition Assessment  DOCUMENTATION CODES:   Not applicable  INTERVENTION:   Initiate tube feeding via OG tube: Pivot 1.5 at 20 ml/h increase by 10 ml every 8 hours to goal rate of 60 ml/hr (1440 ml per day)  Provides 2160 kcal, 135 gm protein, 1092 ml free water daily   NUTRITION DIAGNOSIS:   Increased nutrient needs related to  (trauma) as evidenced by estimated needs.  GOAL:   Patient will meet greater than or equal to 90% of their needs  MONITOR:   Vent status,TF tolerance  REASON FOR ASSESSMENT:   Ventilator,Consult Enteral/tube feeding initiation and management  ASSESSMENT:   Pt with PMH of COPD followed by Dr Sherene Sires, HTN, CAD s/p stent who was admitted after fall over 1/2 wall from the second story landing with multiple rib fxs, R pneumothorax, large scalp laceration, and T8 vertebral body fxs, T7, T4-8, C7 fxs.   Spoke with sister at bedside. She reports that after pt's mom died he was living in a camper without running water/electricity. Pt came to stay with sister and gained weight with better access to food. Pt prepares his own sausage (4 links), 2 eggs, and 2 toast for Breakfast, lunch is soup and sandwich, dinner sister cooks. He also eats a lot of little debbie snack cakes. Sister reports that pt had not been eating much for the last 4 - 6 weeks and she is unsure why. Per sister pt smokes marijuana consistently which increases appetite.  No recent weight loss, sister said pt did raise his voice to her last week which is out of character for him. Sister concerned about pt taking pain pills PTA. Social work consulted. Pt positive or opiates and mariajuana on admission.    Pt discussed during ICU rounds and with RN.  Per RN plan is to hold off on surgery for now. Spoke with trauma ok to start TF. Pt continues to lie flat.  Pt weaned off vasopressin, propofol, and ketamine.   Patient is currently intubated on ventilator support MV: 8.5 L/min Temp (24hrs),  Avg:99.2 F (37.3 C), Min:98.8 F (37.1 C), Max:100 F (37.8 C)   Medications reviewed and include: colace, SSI, solumedrol, miralax LR @ 100 ml/hr Levophed @ 8 mcg  Labs reviewed: TG: 739 ->265 CBG's: 138-159 CT: 240 ml  OG tube in stomach; abd distended  NUTRITION - FOCUSED PHYSICAL EXAM:  Flowsheet Row Most Recent Value  Orbital Region No depletion  Upper Arm Region No depletion  Thoracic and Lumbar Region No depletion  Buccal Region No depletion  Temple Region No depletion  Clavicle Bone Region No depletion  Clavicle and Acromion Bone Region No depletion  Scapular Bone Region Unable to assess  Dorsal Hand No depletion  Patellar Region No depletion  Anterior Thigh Region No depletion  Posterior Calf Region No depletion  Edema (RD Assessment) --  [facial]  Hair Reviewed  Eyes Unable to assess  Mouth Unable to assess  Skin Reviewed  Nails Reviewed       Diet Order:   Diet Order            Diet NPO time specified  Diet effective midnight                 EDUCATION NEEDS:   No education needs have been identified at this time  Skin:  Skin Assessment: Reviewed RN Assessment (head laceration)  Last BM:  unknown  Height:   Ht Readings from Last 1 Encounters:  08/17/20 5\' 8"  (  1.727 m)    Weight:   Wt Readings from Last 1 Encounters:  08/17/20 85.7 kg    BMI:  Body mass index is 28.74 kg/m.  Estimated Nutritional Needs:   Kcal:  2000-2200  Protein:  120-135 grams  Fluid:  >2 L/day  Cammy Copa., RD, LDN, CNSC See AMiON for contact information

## 2020-08-20 NOTE — Progress Notes (Addendum)
Verbal order obtained from MD Lovick to transition from versed to propofol if needed for sedation.   68mL of versed from bag wasted with RN Heloise Purpura

## 2020-08-20 NOTE — Plan of Care (Signed)
  Problem: Activity: Goal: Risk for activity intolerance will decrease Outcome: Not Progressing   Problem: Safety: Goal: Non-violent Restraint(s) Outcome: Progressing   Problem: Nutrition: Goal: Adequate nutrition will be maintained Outcome: Progressing

## 2020-08-20 NOTE — Procedures (Signed)
Arterial Catheter Insertion Procedure Note  Adonijah Baena  [id number]  09-18-1965  Date:08/20/20  Time:10:41 AM    Provider Performing: Darolyn Rua    Procedure: Insertion of Arterial Line (28315) without US guidance  Indication(s) Blood pressure monitoring and/or need for frequent ABGs  Consent Risks of the procedure as well as the alternatives and risks of each were explained to the patient and/or caregiver.  Consent for the procedure was obtained and is signed in the bedside chart  Anesthesia None   Time Out Verified patient identification, verified procedure, site/side was marked, verified correct patient position, special equipment/implants available, medications/allergies/relevant history reviewed, required imaging and test results available.   Sterile Technique Maximal sterile technique including full sterile barrier drape, hand hygiene, sterile gown, sterile gloves, mask, hair covering, sterile ultrasound probe cover (if used).   Procedure Description Area of catheter insertion was cleaned with chlorhexidine and draped in sterile fashion. Without real-time ultrasound guidance an arterial catheter was placed into the right radial artery.  Appropriate arterial tracings confirmed on monitor.     Complications/Tolerance None; patient tolerated the procedure well.   EBL Minimal   Specimen(s) None

## 2020-08-20 NOTE — Progress Notes (Signed)
Events of last night noted.  Have spoken with trauma surgeon at length.  Obviously he is not stable for surgery at this time.  I have gone over the imaging with Dr. Jordan Likes and we tend to agree that at this point we would recommend bracing for the fracture once he is able to be mobilized.  The patient is still flat bedrest and intubated and sedated.  Right now the risk of a general anesthetic and surgery would be quite high.  Surgery once again canceled for today.  For now I think we will follow peripherally while the trauma team will get the patient stabilized.  Once he is stabilized and extubated I would recommend mobilization in the brace.  I think the risk of neurologic compromise is quite low, and Dr Jordan Likes agrees with this and also recommernds a trial of bracing and only moving forward with stabilization surgery if the fracture progresses with bracing and mobilization.  I think this is a reasonable plan.  All of this has been discussed with Dr. Bedelia Person.

## 2020-08-20 NOTE — Progress Notes (Signed)
Ketamine stopped at 0930. Rest of bag (approx 50 mL) and entire unused bag wasted with RN Ferman Hamming per [employer].

## 2020-08-21 ENCOUNTER — Ambulatory Visit (HOSPITAL_COMMUNITY): Payer: Medicaid Other

## 2020-08-21 ENCOUNTER — Inpatient Hospital Stay (HOSPITAL_COMMUNITY): Payer: Medicaid Other

## 2020-08-21 ENCOUNTER — Encounter: Payer: Self-pay | Admitting: *Deleted

## 2020-08-21 LAB — GLUCOSE, CAPILLARY
Glucose-Capillary: 126 mg/dL — ABNORMAL HIGH (ref 70–99)
Glucose-Capillary: 137 mg/dL — ABNORMAL HIGH (ref 70–99)
Glucose-Capillary: 155 mg/dL — ABNORMAL HIGH (ref 70–99)
Glucose-Capillary: 158 mg/dL — ABNORMAL HIGH (ref 70–99)
Glucose-Capillary: 160 mg/dL — ABNORMAL HIGH (ref 70–99)
Glucose-Capillary: 177 mg/dL — ABNORMAL HIGH (ref 70–99)

## 2020-08-21 LAB — TRIGLYCERIDES: Triglycerides: 220 mg/dL — ABNORMAL HIGH (ref <150)

## 2020-08-21 LAB — PHOSPHORUS
Phosphorus: 1.9 mg/dL — ABNORMAL LOW (ref 2.5–4.6)
Phosphorus: 2.6 mg/dL (ref 2.5–4.6)

## 2020-08-21 LAB — MAGNESIUM
Magnesium: 2.6 mg/dL — ABNORMAL HIGH (ref 1.7–2.4)
Magnesium: 2.8 mg/dL — ABNORMAL HIGH (ref 1.7–2.4)

## 2020-08-21 MED ORDER — FENTANYL 2500MCG IN NS 250ML (10MCG/ML) PREMIX INFUSION
0.0000 ug/h | INTRAVENOUS | Status: AC
  Administered 2020-08-21: 400 ug/h via INTRAVENOUS
  Filled 2020-08-21: qty 250

## 2020-08-21 MED ORDER — FENTANYL 2500MCG IN NS 250ML (10MCG/ML) PREMIX INFUSION
0.0000 ug/h | INTRAVENOUS | Status: DC
  Administered 2020-08-21: 400 ug/h via INTRAVENOUS
  Filled 2020-08-21: qty 250

## 2020-08-21 MED ORDER — FENTANYL CITRATE (PF) 100 MCG/2ML IJ SOLN
50.0000 ug | INTRAMUSCULAR | Status: DC | PRN
  Administered 2020-08-25 – 2020-09-04 (×9): 50 ug via INTRAVENOUS

## 2020-08-21 MED ORDER — ENOXAPARIN SODIUM 30 MG/0.3ML ~~LOC~~ SOLN
30.0000 mg | Freq: Two times a day (BID) | SUBCUTANEOUS | Status: DC
  Administered 2020-08-21 – 2020-09-05 (×29): 30 mg via SUBCUTANEOUS
  Filled 2020-08-21 (×29): qty 0.3

## 2020-08-21 MED ORDER — CLONAZEPAM 0.5 MG PO TABS
0.5000 mg | ORAL_TABLET | Freq: Two times a day (BID) | ORAL | Status: DC
  Administered 2020-08-21 – 2020-08-23 (×5): 0.5 mg
  Filled 2020-08-21 (×5): qty 1

## 2020-08-21 MED ORDER — FENTANYL CITRATE (PF) 2500 MCG/50ML IJ SOLN
0.0000 ug/h | Status: DC
  Filled 2020-08-21: qty 100

## 2020-08-21 MED ORDER — QUETIAPINE FUMARATE 25 MG PO TABS
50.0000 mg | ORAL_TABLET | Freq: Two times a day (BID) | ORAL | Status: DC
  Administered 2020-08-21 – 2020-08-23 (×5): 50 mg
  Filled 2020-08-21 (×5): qty 2

## 2020-08-21 MED ORDER — EMPTY CONTAINERS FLEXIBLE MISC
0.0000 ug/h | Status: DC
  Filled 2020-08-21 (×2): qty 100

## 2020-08-21 MED ORDER — IPRATROPIUM-ALBUTEROL 0.5-2.5 (3) MG/3ML IN SOLN
3.0000 mL | RESPIRATORY_TRACT | Status: DC | PRN
  Administered 2020-08-21 – 2020-08-25 (×2): 3 mL via RESPIRATORY_TRACT
  Filled 2020-08-21 (×2): qty 3

## 2020-08-21 MED ORDER — FENTANYL CITRATE (PF) 2500 MCG/50ML IJ SOLN
0.0000 ug/h | Status: DC
  Administered 2020-08-21 – 2020-08-22 (×3): 400 ug/h via INTRAVENOUS
  Administered 2020-08-23: 350 ug/h via INTRAVENOUS
  Administered 2020-08-24: 300 ug/h via INTRAVENOUS
  Administered 2020-08-25: 200 ug/h via INTRAVENOUS
  Administered 2020-08-26: 250 ug/h via INTRAVENOUS
  Administered 2020-08-27: 150 ug/h via INTRAVENOUS
  Administered 2020-08-28: 175 ug/h via INTRAVENOUS
  Administered 2020-08-29 – 2020-08-31 (×2): 125 ug/h via INTRAVENOUS
  Administered 2020-09-01: 150 ug/h via INTRAVENOUS
  Administered 2020-09-02 – 2020-09-04 (×2): 200 ug/h via INTRAVENOUS
  Administered 2020-09-05: 50 ug/h via INTRAVENOUS
  Filled 2020-08-21 (×16): qty 100
  Filled 2020-08-21: qty 50
  Filled 2020-08-21 (×8): qty 100

## 2020-08-21 MED ORDER — DEXMEDETOMIDINE HCL IN NACL 400 MCG/100ML IV SOLN
0.4000 ug/kg/h | INTRAVENOUS | Status: DC
  Administered 2020-08-21: 0.4 ug/kg/h via INTRAVENOUS
  Filled 2020-08-21 (×2): qty 100

## 2020-08-21 MED ORDER — BETHANECHOL CHLORIDE 10 MG PO TABS
25.0000 mg | ORAL_TABLET | Freq: Three times a day (TID) | ORAL | Status: DC
  Administered 2020-08-21 – 2020-08-30 (×30): 25 mg
  Filled 2020-08-21 (×25): qty 3
  Filled 2020-08-21: qty 2.5
  Filled 2020-08-21 (×5): qty 3

## 2020-08-21 NOTE — Progress Notes (Signed)
Sedation cut in half throughout the morning, pt began to desat to 80s, RT came to bedside and bagged until o2 in high 90s.  BP up to 245/95 per a line, 171/97 per cuff.  Aysynchronous w/ vent and moving all extremities.  2 mg versed bolus given per order and sedation titrated up to 400 mcg/hr of fentanyl and 40 mcg/kg/min of propofol.  SBP remains around 160 and oxygen sat 100.  Made Dr. Janee Morn aware and will continue to monitor.

## 2020-08-21 NOTE — TOC Initial Note (Signed)
Transition of Care [facility]) - Initial/Assessment Note    Patient Details  Name: Charles Weeks MRN: [id number] Date of Birth: 1965-07-22  Transition of [facility]) CM/SW Contact:    Glennon Mac, RN Phone Number: 08/21/2020, 4:18 PM  Clinical Narrative: Pt admitted on 08/17/20 after falling down stairs, sustaining Rt PTX, bilateral mult rib fx, 3 column T8 vertebral body fx, T7 neural arch fx, Rt TVP T4-8, Rt C7 TVP fx through foramen, large scalp laceration.  PTA, pt independent and living at home with his sister, Charles Weeks.  Spoke with sister by phone; she states that pt is disabled and has two daughters.  She reports that his daughters care for pt, but are not very involved. She states that she is his only support, and she is disabled as well.  Explained Case Manager role to her; she is appreciative of my call.  Currently, patient remains intubated and sedated; will continue to follow as pt progresses.                      Expected Discharge Plan: IP Rehab Facility Barriers to Discharge: Continued Medical Work up        Expected Discharge Plan and Services Expected Discharge Plan: IP Rehab Facility   Discharge Planning Services: CM Consult   Living arrangements for the past 2 months: Single Family Home                                      Prior Living Arrangements/Services Living arrangements for the past 2 months: Single Family Home Lives with:: Siblings Patient language and need for interpreter reviewed:: Yes        Need for Family Participation in Patient Care: Yes (Comment) Care giver support system in place?:  (sister Charles Weeks)   Criminal Activity/Legal Involvement Pertinent to Current Situation/Hospitalization: No - Comment as needed               Emotional Assessment Appearance:: Appears stated age Attitude/Demeanor/Rapport: Unable to Assess Affect (typically observed): Unable to Assess        Admission diagnosis:  Trauma [T14.90XA] Pneumothorax, right  [J93.9] Thoracic spine fracture [facility]) [S22.009A] Patient Active Problem List   Diagnosis Date Noted  . Thoracic spine fracture (HCC) 08/17/2020   PCP:  No primary care provider on file. Pharmacy:   [facility] - [place], [place] - [address]  [place] [address] Phone: [phone] Fax: [phone]     Social Determinants of Health (SDOH) Interventions    Readmission Risk Interventions No flowsheet data found.  Quintella Baton, RN, BSN  Trauma/Neuro ICU Case Manager [phone]

## 2020-08-21 NOTE — Progress Notes (Signed)
Patient ID: Charles Weeks, male   DOB: 09/06/1965, 55 y.o.   MRN: [id number] Follow up - Trauma Critical Care  Patient Details:    Charles Weeks is an 55 y.o. male.  Lines/tubes : Airway 7.5 mm (Active)  Secured at (cm) 27 cm 08/21/20 0806  Measured From Lips 08/21/20 0806  Secured Location Right 08/21/20 0806  Secured By [employer] 08/21/20 0806  Tube Holder Repositioned Yes 08/21/20 0806  Prone position No 08/20/20 0323  Cuff Pressure (cm H2O) Green OR 18-26 [facility] 08/21/20 0806  Site Condition Dry 08/21/20 0806     CVC Triple Lumen 08/18/20 Right Subclavian (Active)  Indication for Insertion or Continuance of Line Vasoactive infusions 08/21/20 0723  Site Assessment Clean;Dry;Intact 08/21/20 0723  Proximal Lumen Status Flushed;Infusing 08/21/20 0723  Medial Lumen Status Flushed;Infusing 08/21/20 0723  Distal Lumen Status Flushed;Infusing;In-line blood sampling system in place;Blood return noted 08/21/20 0723  Dressing Type Transparent 08/21/20 0723  Dressing Status Clean;Dry;Intact 08/21/20 0723  Antimicrobial disc in place? Yes 08/21/20 0723  Line Care Connections checked and tightened 08/21/20 0723  Dressing Intervention New dressing 08/18/20 1200  Dressing Change Due 08/25/20 08/21/20 0723     Chest Tube Lateral;Right (Active)  Status -20 cm H2O 08/21/20 0800  Chest Tube Air Leak None 08/21/20 0800  Patency Intervention Tip/tilt 08/21/20 0800  Drainage Description Sanguineous 08/21/20 0800  Dressing Status Clean;Dry;Intact 08/21/20 0800  Dressing Intervention Dressing changed 08/18/20 1700  Site Assessment Clean;Dry;Intact 08/21/20 0800  Surrounding Skin Dry;Intact 08/21/20 0800  Output (mL) 100 mL 08/21/20 0600     NG/OG Tube Orogastric Center mouth Xray (Active)  External Length of Tube (cm) - (if applicable) 66.5 cm 08/20/20 0800  Site Assessment Clean;Dry;Intact 08/21/20 0800  Ongoing Placement Verification No change in cm markings or external length  of tube from initial placement;No change in respiratory status;No acute changes, not attributed to clinical condition 08/21/20 0800  Status Suction-low intermittent 08/21/20 0800  Drainage Appearance Brown 08/20/20 0800  Output (mL) 100 mL 08/20/20 1430     Urethral Catheter Franky Macho RN Latex 16 Fr. (Active)  Indication for Insertion or Continuance of Catheter Therapy based on hourly urine output monitoring and documentation for critical condition (NOT STRICT I&O) 08/21/20 0800  Site Assessment Clean;Intact;Dry 08/21/20 0800  Catheter Maintenance Bag below level of bladder;Catheter secured;Drainage bag/tubing not touching floor;Insertion date on drainage bag;No dependent loops;Seal intact 08/21/20 0800  Collection Container Standard drainage bag 08/21/20 0800  Securement Method Securing device (Describe) 08/21/20 0800  Urinary Catheter Interventions (if applicable) Unclamped 08/21/20 0800  Output (mL) 150 mL 08/21/20 0910    Microbiology/Sepsis markers: Results for orders placed or performed during the hospital encounter of 08/17/20  Resp Panel by RT-PCR (Flu A&B, Covid) Nasopharyngeal Swab     Status: None   Collection Time: 08/17/20  3:40 PM   Specimen: Nasopharyngeal Swab; Nasopharyngeal(NP) swabs in vial transport medium  Result Value Ref Range Status   SARS Coronavirus 2 by RT PCR NEGATIVE NEGATIVE Final    Comment: (NOTE) SARS-CoV-2 target nucleic acids are NOT DETECTED.  The SARS-CoV-2 RNA is generally detectable in upper respiratory specimens during the acute phase of infection. The lowest concentration of SARS-CoV-2 viral copies this assay can detect is 138 copies/mL. A negative result does not preclude SARS-Cov-2 infection and should not be used as the sole basis for treatment or other patient management decisions. A negative result may occur with  improper specimen collection/handling, submission of specimen other than nasopharyngeal swab, presence of viral  mutation(s) within the areas targeted by this assay, and inadequate number of viral copies(<138 copies/mL). A negative result must be combined with clinical observations, patient history, and epidemiological information. The expected result is Negative.  Fact Sheet for Patients:  BloggerCourse.com  Fact Sheet for Healthcare Providers:  SeriousBroker.it  This test is no t yet approved or cleared by the [place] FDA and  has been authorized for detection and/or diagnosis of SARS-CoV-2 by FDA under an Emergency Use Authorization (EUA). This EUA will remain  in effect (meaning this test can be used) for the duration of the COVID-19 declaration under Section 564(b)(1) of the Act, 21 U.S.C.section 360bbb-3(b)(1), unless the authorization is terminated  or revoked sooner.       Influenza A by PCR NEGATIVE NEGATIVE Final   Influenza B by PCR NEGATIVE NEGATIVE Final    Comment: (NOTE) The Xpert Xpress SARS-CoV-2/FLU/RSV plus assay is intended as an aid in the diagnosis of influenza from Nasopharyngeal swab specimens and should not be used as a sole basis for treatment. Nasal washings and aspirates are unacceptable for Xpert Xpress SARS-CoV-2/FLU/RSV testing.  Fact Sheet for Patients: BloggerCourse.com  Fact Sheet for Healthcare Providers: SeriousBroker.it  This test is not yet approved or cleared by the [place] FDA and has been authorized for detection and/or diagnosis of SARS-CoV-2 by FDA under an Emergency Use Authorization (EUA). This EUA will remain in effect (meaning this test can be used) for the duration of the COVID-19 declaration under Section 564(b)(1) of the Act, 21 U.S.C. section 360bbb-3(b)(1), unless the authorization is terminated or revoked.  Performed at [facility], [address] [place], [place] [address]   Surgical PCR screen     Status: None    Collection Time: 08/19/20  7:38 AM   Specimen: Nasal Mucosa; Nasal Swab  Result Value Ref Range Status   MRSA, PCR NEGATIVE NEGATIVE Final   Staphylococcus aureus NEGATIVE NEGATIVE Final    Comment: (NOTE) The Xpert SA Assay (FDA approved for NASAL specimens in patients 50 years of age and older), is one component of a comprehensive surveillance program. It is not intended to diagnose infection nor to guide or monitor treatment. Performed at [facility], [address] [place], [place] [address]     Anti-infectives:  Anti-infectives (From admission, onward)   Start     Dose/Rate Route Frequency Ordered Stop   08/19/20 0845  cefTRIAXone (ROCEPHIN) 2 g in sodium chloride 0.9 % 100 mL IVPB        2 g 200 mL/hr over 30 Minutes Intravenous Every 24 hours 08/19/20 0753     08/17/20 1515  ceFAZolin (ANCEF) IVPB 2g/100 mL premix        2 g 200 mL/hr over 30 Minutes Intravenous  Once 08/17/20 1500 08/17/20 1530       Subjective:    Overnight Issues:   Objective:  Vital signs for last 24 hours: Temp:  [98.1 F (36.7 C)-99.4 F (37.4 C)] 98.1 F (36.7 C) (04/05 0800) Pulse Rate:  [63-187] 75 (04/05 1015) Resp:  [14-18] 15 (04/05 1015) BP: (90-165)/(57-88) 112/69 (04/05 1015) SpO2:  [91 %-100 %] 100 % (04/05 1015) Arterial Line BP: (115-211)/(45-87) 162/71 (04/05 1000) FiO2 (%):  [40 %-70 %] 70 % (04/05 0930)  Hemodynamic parameters for last 24 hours:    Intake/Output from previous day: 04/04 0701 - 04/05 0700 In: 4587.2 [I.V.:3099.2; NG/GT:386.7; IV Piggyback:1101.3] Out: 1393 [Urine:1163; Emesis/NG output:100; Chest Tube:130]  Intake/Output this shift: Total I/O  In: 409.9 [I.V.:249.9; NG/GT:60; IV Piggyback:100] Out: 150 [Urine:150]  Vent settings for last 24 hours: Vent Mode: PRVC FiO2 (%):  [40 %-70 %] 70 % Set Rate:  [18 bmp] 18 bmp Vt Set:  [480 mL] 480 mL PEEP:  [8 cmH20-10 cmH20] 8 cmH20 Plateau Pressure:  [21 cmH20-31 cmH20] 24 cmH20  Physical  Exam:  General: sedated Neuro: sedated on vent HEENT/Neck: ETT Resp: min wheeze CVS: RRR GI: soft, NT, old midline scar Extremities: edema 1+  Results for orders placed or performed during the hospital encounter of 08/17/20 (from the past 24 hour(s))  I-STAT 7, (LYTES, BLD GAS, ICA, H+H)     Status: Abnormal   Collection Time: 08/20/20 10:39 AM  Result Value Ref Range   pH, Arterial 7.359 7.350 - 7.450   pCO2 arterial 55.9 (H) 32.0 - 48.0 mmHg   pO2, Arterial 73 (L) 83.0 - 108.0 mmHg   Bicarbonate 31.4 (H) 20.0 - 28.0 mmol/L   TCO2 33 (H) 22 - 32 mmol/L   O2 Saturation 93.0 %   Acid-Base Excess 5.0 (H) 0.0 - 2.0 mmol/L   Sodium 145 135 - 145 mmol/L   Potassium 3.7 3.5 - 5.1 mmol/L   Calcium, Ion 1.18 1.15 - 1.40 mmol/L   HCT 21.0 (L) 39.0 - 52.0 %   Hemoglobin 7.1 (L) 13.0 - 17.0 g/dL   Patient temperature 39.7 F    Collection site art line    Drawn by RT    Sample type ARTERIAL   Glucose, capillary     Status: Abnormal   Collection Time: 08/20/20 11:59 AM  Result Value Ref Range   Glucose-Capillary 138 (H) 70 - 99 mg/dL  Glucose, capillary     Status: Abnormal   Collection Time: 08/20/20  3:45 PM  Result Value Ref Range   Glucose-Capillary 133 (H) 70 - 99 mg/dL  Glucose, capillary     Status: Abnormal   Collection Time: 08/20/20  7:49 PM  Result Value Ref Range   Glucose-Capillary 142 (H) 70 - 99 mg/dL  Glucose, capillary     Status: Abnormal   Collection Time: 08/20/20 11:44 PM  Result Value Ref Range   Glucose-Capillary 153 (H) 70 - 99 mg/dL  Glucose, capillary     Status: Abnormal   Collection Time: 08/21/20  3:57 AM  Result Value Ref Range   Glucose-Capillary 126 (H) 70 - 99 mg/dL  Magnesium     Status: Abnormal   Collection Time: 08/21/20  5:00 AM  Result Value Ref Range   Magnesium 2.6 (H) 1.7 - 2.4 mg/dL  Phosphorus     Status: Abnormal   Collection Time: 08/21/20  5:00 AM  Result Value Ref Range   Phosphorus 1.9 (L) 2.5 - 4.6 mg/dL  Triglycerides      Status: Abnormal   Collection Time: 08/21/20  5:00 AM  Result Value Ref Range   Triglycerides 220 (H) <150 mg/dL  Glucose, capillary     Status: Abnormal   Collection Time: 08/21/20  8:06 AM  Result Value Ref Range   Glucose-Capillary 137 (H) 70 - 99 mg/dL    Assessment & Plan: Present on Admission: . Thoracic spine fracture (HCC)    LOS: 4 days   Additional comments:I reviewed the patient's new clinical lab test results. . Fall R PTX- CT in place on -20cm,chest x-ray no PTX - water seal Multiple B rib fxs- these are at the junction of the thoracic vertebral bodies, pain control, IS 3 column T8 vertebral body  fx- per Dr. Yetta Barre, non-op now, plan mobilize in brace once off vent T7 neural arch fx- per nsgy R TVP T4-8 - pain control, per nsgy R C7 TVP fx through foramen- C collar per Dr. Yetta Barre, CTAnegatvie Large scalp laceration/right eyebrow laceration- repair by Dr. Lovick4/05/2020, sutures need to be removed 08/27/20 Right neck abrasion/skin tear- bacitracin BID Facial ecchymosis- CT maxillofacialdemonstrates no facial bone fractures. Some anterior subluxation of the right mandibular condyle HTN- home lopressor 25mg  daily, hold now due toBP CAD/H/O MI/DES- on ASA at home. Hold for now. Monitor, on tele Acute hypercarbic ventilator dependent respiratory failure/COPD-CCM managing, 60% and PEEP 8, appreciate assistance  Hypotension - resolved, off pressors Depression/anxiety- home meds when available BPH- cannot give flomax per tube. Add urecholine and do voiding trial Abl ANEMIA - watch as Hb7.1 FEN-TF VTE-start LMWH as spine non-op Dispo - ICU Critical Care Total Time*: 33 Minutes  Violeta Gelinas, MD, MPH, FACS Trauma & General Surgery Use [email or web address] to contact on call provider  08/21/2020  *Care during the described time interval was provided by me. I have reviewed this patient's available data, including medical history, events of note, physical  examination and test results as part of my evaluation.

## 2020-08-21 NOTE — Progress Notes (Signed)
Subjective: Patient intubated and sedated on propofol and fentanyl.   Objective: Vital signs in last 24 hours: Temp:  [98.6 F (37 C)-99.4 F (37.4 C)] 98.6 F (37 C) (04/05 0400) Pulse Rate:  [64-98] 65 (04/05 0700) Resp:  [14-20] 18 (04/05 0700) BP: (90-124)/(57-76) 92/58 (04/05 0700) SpO2:  [91 %-100 %] 100 % (04/05 0700) Arterial Line BP: (115-154)/(45-63) 125/52 (04/05 0700) FiO2 (%):  [40 %-50 %] 40 % (04/05 0335)  Intake/Output from previous day: 04/04 0701 - 04/05 0700 In: 4587.2 [I.V.:3099.2; NG/GT:386.7; IV Piggyback:1101.3] Out: 1393 [Urine:1163; Emesis/NG output:100; Chest Tube:130] Intake/Output this shift: No intake/output data recorded.   Lab Results: Lab Results  Component Value Date   WBC 23.2 (H) 08/18/2020   HGB 7.1 (L) 08/20/2020   HCT 21.0 (L) 08/20/2020   MCV 95.8 08/18/2020   PLT 220 08/18/2020   Lab Results  Component Value Date   INR 1.2 08/17/2020   BMET Lab Results  Component Value Date   NA 145 08/20/2020   K 3.7 08/20/2020   CL 104 08/19/2020   CO2 24 08/19/2020   GLUCOSE 216 (H) 08/19/2020   BUN 16 08/19/2020   CREATININE 1.11 08/19/2020   CALCIUM 8.0 (L) 08/19/2020    Studies/Results: DG CHEST PORT 1 VIEW  Result Date: 08/19/2020 CLINICAL DATA:  Endotracheal tube EXAM: PORTABLE CHEST 1 VIEW COMPARISON:  08/19/2020 FINDINGS: Endotracheal tube endotracheal tube is 4 cm above the carina. NG tube is in the stomach. Right central line tip in the SVC. Right upper chest tube remains in place, unchanged. No visible pneumothorax. Continued left lower lobe airspace opacity, similar to prior study. No confluent opacity on the right. No visible effusions. Old right rib fractures, unchanged. IMPRESSION: Support devices are stable. Continued left lower lobe retrocardiac opacity, unchanged. Electronically Signed   By: Charlett Nose M.D.   On: 08/19/2020 20:21   DG CHEST PORT 1 VIEW  Result Date: 08/19/2020 CLINICAL DATA:  Respirator dependent.  Evaluate perihilar opacification. EXAM: PORTABLE CHEST 1 VIEW COMPARISON:  Radiographs 08/18/2020 and 08/17/2020. FINDINGS: 1155 hours. The endotracheal tube, enteric tube, right subclavian central venous catheter and pigtail catheter superiorly in the right pleural space are unchanged. The heart size and mediastinal contours are stable. There is increased retrocardiac opacity with volume loss, suspicious for left lower lobe consolidation/collapse. The hazy left perihilar opacity seen on the most recent study has partially cleared. The right lung is clear. There is no pneumothorax. The bones appear unchanged. IMPRESSION: Interval increased retrocardiac opacity consistent with left lower lobe consolidation or collapse. No pneumothorax. Stable support system. Electronically Signed   By: Carey Bullocks M.D.   On: 08/19/2020 12:32    Assessment/Plan: S/p thoracic burst fracture. We are going to hold off on any surgical intervention at this time and when the time is appropriate we will see how he does upright in a brace. BP stable off of pressors since yesterday. Vent management per trauma.    LOS: 4 days    [facility] 08/21/2020, 7:58 AM

## 2020-08-22 ENCOUNTER — Inpatient Hospital Stay (HOSPITAL_COMMUNITY): Payer: Medicaid Other

## 2020-08-22 LAB — CBC
HCT: 20.9 % — ABNORMAL LOW (ref 39.0–52.0)
Hemoglobin: 6.7 g/dL — CL (ref 13.0–17.0)
MCH: 32.7 pg (ref 26.0–34.0)
MCHC: 32.1 g/dL (ref 30.0–36.0)
MCV: 102 fL — ABNORMAL HIGH (ref 80.0–100.0)
Platelets: 188 10*3/uL (ref 150–400)
RBC: 2.05 MIL/uL — ABNORMAL LOW (ref 4.22–5.81)
RDW: 14.9 % (ref 11.5–15.5)
WBC: 17.9 10*3/uL — ABNORMAL HIGH (ref 4.0–10.5)
nRBC: 7.1 % — ABNORMAL HIGH (ref 0.0–0.2)

## 2020-08-22 LAB — MAGNESIUM
Magnesium: 2.7 mg/dL — ABNORMAL HIGH (ref 1.7–2.4)
Magnesium: 2.7 mg/dL — ABNORMAL HIGH (ref 1.7–2.4)

## 2020-08-22 LAB — BASIC METABOLIC PANEL
Anion gap: 4 — ABNORMAL LOW (ref 5–15)
BUN: 33 mg/dL — ABNORMAL HIGH (ref 6–20)
CO2: 34 mmol/L — ABNORMAL HIGH (ref 22–32)
Calcium: 7.9 mg/dL — ABNORMAL LOW (ref 8.9–10.3)
Chloride: 107 mmol/L (ref 98–111)
Creatinine, Ser: 0.91 mg/dL (ref 0.61–1.24)
GFR, Estimated: >60 mL/min (ref >60)
Glucose, Bld: 153 mg/dL — ABNORMAL HIGH (ref 70–99)
Potassium: 3.8 mmol/L (ref 3.5–5.1)
Sodium: 145 mmol/L (ref 135–145)

## 2020-08-22 LAB — GLUCOSE, CAPILLARY
Glucose-Capillary: 140 mg/dL — ABNORMAL HIGH (ref 70–99)
Glucose-Capillary: 141 mg/dL — ABNORMAL HIGH (ref 70–99)
Glucose-Capillary: 149 mg/dL — ABNORMAL HIGH (ref 70–99)
Glucose-Capillary: 162 mg/dL — ABNORMAL HIGH (ref 70–99)
Glucose-Capillary: 163 mg/dL — ABNORMAL HIGH (ref 70–99)
Glucose-Capillary: 174 mg/dL — ABNORMAL HIGH (ref 70–99)

## 2020-08-22 LAB — HEMOGLOBIN AND HEMATOCRIT, BLOOD
HCT: 23.1 % — ABNORMAL LOW (ref 39.0–52.0)
Hemoglobin: 7.3 g/dL — ABNORMAL LOW (ref 13.0–17.0)

## 2020-08-22 LAB — ABO/RH: ABO/RH(D): O POS

## 2020-08-22 LAB — PHOSPHORUS
Phosphorus: 2.6 mg/dL (ref 2.5–4.6)
Phosphorus: 3.8 mg/dL (ref 2.5–4.6)

## 2020-08-22 LAB — PREPARE RBC (CROSSMATCH)

## 2020-08-22 MED ORDER — BISACODYL 10 MG RE SUPP
10.0000 mg | Freq: Once | RECTAL | Status: AC
  Administered 2020-08-22: 10 mg via RECTAL
  Filled 2020-08-22: qty 1

## 2020-08-22 MED ORDER — SODIUM CHLORIDE 0.9% IV SOLUTION: Freq: Once | INTRAVENOUS | Status: DC

## 2020-08-22 MED ORDER — MOMETASONE FURO-FORMOTEROL FUM 200-5 MCG/ACT IN AERO
2.0000 | INHALATION_SPRAY | Freq: Two times a day (BID) | RESPIRATORY_TRACT | Status: DC
  Administered 2020-08-22 – 2020-10-09 (×81): 2 via RESPIRATORY_TRACT
  Filled 2020-08-22 (×2): qty 8.8

## 2020-08-22 MED ORDER — MONTELUKAST SODIUM 10 MG PO TABS: 10.0000 mg | ORAL_TABLET | Freq: Every day | ORAL | Status: DC

## 2020-08-22 MED ORDER — METOPROLOL TARTRATE 25 MG PO TABS: 12.5000 mg | ORAL_TABLET | Freq: Every day | ORAL | Status: DC

## 2020-08-22 MED ORDER — BUSPIRONE HCL 10 MG PO TABS
10.0000 mg | ORAL_TABLET | Freq: Three times a day (TID) | ORAL | Status: DC
  Administered 2020-08-22 – 2020-10-09 (×144): 10 mg
  Filled 2020-08-22 (×145): qty 1

## 2020-08-22 MED ORDER — SENNA 8.6 MG PO TABS
1.0000 | ORAL_TABLET | Freq: Every day | ORAL | Status: DC
  Administered 2020-08-22 – 2020-09-20 (×18): 8.6 mg
  Filled 2020-08-22 (×23): qty 1

## 2020-08-22 MED ORDER — POTASSIUM PHOSPHATES 15 MMOLE/5ML IV SOLN
20.0000 mmol | Freq: Once | INTRAVENOUS | Status: AC
  Administered 2020-08-22: 20 mmol via INTRAVENOUS
  Filled 2020-08-22: qty 6.67

## 2020-08-22 MED ORDER — METOPROLOL TARTRATE 25 MG PO TABS
12.5000 mg | ORAL_TABLET | Freq: Every day | ORAL | Status: DC
  Administered 2020-08-22 – 2020-08-30 (×9): 12.5 mg
  Filled 2020-08-22 (×9): qty 1

## 2020-08-22 MED ORDER — MONTELUKAST SODIUM 10 MG PO TABS
10.0000 mg | ORAL_TABLET | Freq: Every day | ORAL | Status: DC
  Administered 2020-08-22 – 2020-10-08 (×48): 10 mg
  Filled 2020-08-22 (×49): qty 1

## 2020-08-22 MED ORDER — ALBUTEROL SULFATE HFA 108 (90 BASE) MCG/ACT IN AERS
1.0000 | INHALATION_SPRAY | RESPIRATORY_TRACT | Status: DC | PRN
  Filled 2020-08-22: qty 6.7

## 2020-08-22 MED ORDER — TERAZOSIN HCL 1 MG PO CAPS
1.0000 mg | ORAL_CAPSULE | Freq: Every day | ORAL | Status: DC
  Administered 2020-08-22 – 2020-08-23 (×2): 1 mg
  Filled 2020-08-22 (×3): qty 1

## 2020-08-22 MED ORDER — BUSPIRONE HCL 10 MG PO TABS: 10.0000 mg | ORAL_TABLET | Freq: Three times a day (TID) | ORAL | Status: DC

## 2020-08-22 MED ORDER — ALBUTEROL SULFATE (2.5 MG/3ML) 0.083% IN NEBU
2.5000 mg | INHALATION_SOLUTION | Freq: Four times a day (QID) | RESPIRATORY_TRACT | Status: DC | PRN
  Administered 2020-08-27 – 2020-09-16 (×3): 2.5 mg via RESPIRATORY_TRACT
  Filled 2020-08-22 (×4): qty 3

## 2020-08-22 MED ORDER — TERAZOSIN HCL 1 MG PO CAPS
1.0000 mg | ORAL_CAPSULE | Freq: Every day | ORAL | Status: DC
  Filled 2020-08-22: qty 1

## 2020-08-22 NOTE — Progress Notes (Signed)
Orthopedic Tech Progress Note Patient Details:  Charles Weeks 1965-12-30 [id number] Left at bedside  Ortho Devices Ortho Device/Splint Location: TLSO Brace Ortho Device/Splint Interventions: Ordered       [name] 08/22/2020, 2:38 PM

## 2020-08-22 NOTE — Progress Notes (Signed)
Trauma/Critical Care Follow Up Note  Subjective:    Overnight Issues:   Objective:  Vital signs for last 24 hours: Temp:  [97.9 F (36.6 C)-98.9 F (37.2 C)] 98.6 F (37 C) (04/06 0800) Pulse Rate:  [52-187] 90 (04/06 0800) Resp:  [14-18] 16 (04/06 0800) BP: (102-165)/(62-92) 154/83 (04/06 0800) SpO2:  [90 %-100 %] 90 % (04/06 0800) Arterial Line BP: (130-211)/(57-87) 159/67 (04/06 0730) FiO2 (%):  [40 %-70 %] 60 % (04/06 0757)  Hemodynamic parameters for last 24 hours:    Intake/Output from previous day: 04/05 0701 - 04/06 0700 In: 1632.4 [I.V.:1042.4; NG/GT:490; IV Piggyback:100] Out: 1005 [Urine:975; Chest Tube:30]  Intake/Output this shift: Total I/O In: 739.5 [I.V.:34.8; NG/GT:700; IV Piggyback:4.7] Out: -   Vent settings for last 24 hours: Vent Mode: PRVC FiO2 (%):  [40 %-70 %] 60 % Set Rate:  [18 bmp] 18 bmp Vt Set:  [480 mL] 480 mL PEEP:  [8 cmH20] 8 cmH20 Plateau Pressure:  [21 cmH20-38 cmH20] 38 cmH20  Physical Exam:  Gen: comfortable, no distress Neuro: non-focal exam HEENT: PERRL Neck: c-collar CV: RRR Pulm: unlabored breathing on MV Abd: full, NT, no BM GU: clear yellow urine Extr: wwp, no edema   Results for orders placed or performed during the hospital encounter of 08/17/20 (from the past 24 hour(s))  Glucose, capillary     Status: Abnormal   Collection Time: 08/21/20 12:20 PM  Result Value Ref Range   Glucose-Capillary 155 (H) 70 - 99 mg/dL  Glucose, capillary     Status: Abnormal   Collection Time: 08/21/20  3:47 PM  Result Value Ref Range   Glucose-Capillary 160 (H) 70 - 99 mg/dL  Magnesium     Status: Abnormal   Collection Time: 08/21/20  4:28 PM  Result Value Ref Range   Magnesium 2.8 (H) 1.7 - 2.4 mg/dL  Phosphorus     Status: None   Collection Time: 08/21/20  4:28 PM  Result Value Ref Range   Phosphorus 2.6 2.5 - 4.6 mg/dL  Glucose, capillary     Status: Abnormal   Collection Time: 08/21/20  7:54 PM  Result Value Ref  Range   Glucose-Capillary 177 (H) 70 - 99 mg/dL  Glucose, capillary     Status: Abnormal   Collection Time: 08/21/20 11:35 PM  Result Value Ref Range   Glucose-Capillary 158 (H) 70 - 99 mg/dL  Glucose, capillary     Status: Abnormal   Collection Time: 08/22/20  3:43 AM  Result Value Ref Range   Glucose-Capillary 141 (H) 70 - 99 mg/dL  Magnesium     Status: Abnormal   Collection Time: 08/22/20  5:00 AM  Result Value Ref Range   Magnesium 2.7 (H) 1.7 - 2.4 mg/dL  Phosphorus     Status: None   Collection Time: 08/22/20  5:00 AM  Result Value Ref Range   Phosphorus 2.6 2.5 - 4.6 mg/dL  CBC     Status: Abnormal   Collection Time: 08/22/20  5:00 AM  Result Value Ref Range   WBC 17.9 (H) 4.0 - 10.5 K/uL   RBC 2.05 (L) 4.22 - 5.81 MIL/uL   Hemoglobin 6.7 (LL) 13.0 - 17.0 g/dL   HCT 82.4 (L) 23.5 - 36.1 %   MCV 102.0 (H) 80.0 - 100.0 fL   MCH 32.7 26.0 - 34.0 pg   MCHC 32.1 30.0 - 36.0 g/dL   RDW 44.3 15.4 - 00.8 %   Platelets 188 150 - 400 K/uL   nRBC  7.1 (H) 0.0 - 0.2 %  Basic metabolic panel     Status: Abnormal   Collection Time: 08/22/20  5:00 AM  Result Value Ref Range   Sodium 145 135 - 145 mmol/L   Potassium 3.8 3.5 - 5.1 mmol/L   Chloride 107 98 - 111 mmol/L   CO2 34 (H) 22 - 32 mmol/L   Glucose, Bld 153 (H) 70 - 99 mg/dL   BUN 33 (H) 6 - 20 mg/dL   Creatinine, Ser 5.46 0.61 - 1.24 mg/dL   Calcium 7.9 (L) 8.9 - 10.3 mg/dL   GFR, Estimated >27 >03 mL/min   Anion gap 4 (L) 5 - 15  Type and screen [facility]     Status: None (Preliminary result)   Collection Time: 08/22/20  7:41 AM  Result Value Ref Range   ABO/RH(D) PENDING    Antibody Screen PENDING    Sample Expiration      08/25/2020,2359 Performed at [facility], [address], [place], [place] [address]   Glucose, capillary     Status: Abnormal   Collection Time: 08/22/20  8:25 AM  Result Value Ref Range   Glucose-Capillary 140 (H) 70 - 99 mg/dL    Assessment & Plan: The plan of  care was discussed with the bedside nurse for the day, who is in agreement with this plan and no additional concerns were raised.   Present on Admission: . Thoracic spine fracture (HCC)    LOS: 5 days   Additional comments:I reviewed the patient's new clinical lab test results.   and I reviewed the patients new imaging test results.    Fall from height  R PTX- CT in place on -20cm,chest x-ray no PTX - water seal Multiple B rib fxs- these are at the junction of the thoracic vertebral bodies, pain control, IS 3 column T8 vertebral body fx- per Dr. Yetta Barre, non-op now, plan mobilize in brace once off vent. Needs CTO brace for HOB>30 degrees T7 neural arch fx- per nsgy R TVP T4-8 - pain control, per nsgy R C7 TVP fx through foramen- C collar per Dr. Yetta Barre, CTAnegatvie Large scalp laceration/right eyebrow laceration- repair by Dr. Lovick4/05/2020, sutures need to be removed 08/27/20 Right neck abrasion/skin tear- bacitracin BID Facial ecchymosis- CT maxillofacialdemonstrates no facial bone fractures. Some anterior subluxation of the right mandibular condyle HTN- home lopressor 25mg  daily, restart at half dose CAD/H/O MI/DES- on ASA at home. Hold for now. 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 Solumedrol x5d per CCM recs, restart home inhalers Hypotension - resolved, off pressors Depression/anxiety- restart home bupropion when off the vent, restarted home buspar BPH- urecholine, failed voiding trial, replace foley today, restart home terazosin at half dose ABL anemia - hgb 6.7 this AM , transfuse 1u pRBC FEN-TF, replete K and phos, escalate bowel regimen, if no BM today, add mag citrate VTE-LMWH Dispo - ICU  Critical Care Total Time: 40 minutes  , MD Trauma & General Surgery Please use [email or web address] to contact on call provider  08/22/2020  *Care during the described time interval was provided by  me. I have reviewed this patient's available data, including medical history, events of note, physical examination and test results as part of my evaluation.

## 2020-08-23 ENCOUNTER — Inpatient Hospital Stay (HOSPITAL_COMMUNITY): Payer: Medicaid Other

## 2020-08-23 ENCOUNTER — Ambulatory Visit: Payer: Medicaid Other | Admitting: Internal Medicine

## 2020-08-23 LAB — BASIC METABOLIC PANEL
Anion gap: 3 — ABNORMAL LOW (ref 5–15)
BUN: 32 mg/dL — ABNORMAL HIGH (ref 6–20)
CO2: 37 mmol/L — ABNORMAL HIGH (ref 22–32)
Calcium: 7.8 mg/dL — ABNORMAL LOW (ref 8.9–10.3)
Chloride: 104 mmol/L (ref 98–111)
Creatinine, Ser: 0.71 mg/dL (ref 0.61–1.24)
GFR, Estimated: >60 mL/min (ref >60)
Glucose, Bld: 183 mg/dL — ABNORMAL HIGH (ref 70–99)
Potassium: 4.5 mmol/L (ref 3.5–5.1)
Sodium: 144 mmol/L (ref 135–145)

## 2020-08-23 LAB — CBC
HCT: 22.8 % — ABNORMAL LOW (ref 39.0–52.0)
Hemoglobin: 7.1 g/dL — ABNORMAL LOW (ref 13.0–17.0)
MCH: 31.3 pg (ref 26.0–34.0)
MCHC: 31.1 g/dL (ref 30.0–36.0)
MCV: 100.4 fL — ABNORMAL HIGH (ref 80.0–100.0)
Platelets: 168 10*3/uL (ref 150–400)
RBC: 2.27 MIL/uL — ABNORMAL LOW (ref 4.22–5.81)
RDW: 16.6 % — ABNORMAL HIGH (ref 11.5–15.5)
WBC: 15.4 10*3/uL — ABNORMAL HIGH (ref 4.0–10.5)
nRBC: 5.9 % — ABNORMAL HIGH (ref 0.0–0.2)

## 2020-08-23 LAB — BPAM RBC
Blood Product Expiration Date: 202205062359
ISSUE DATE / TIME: 202204061105
Unit Type and Rh: 5100

## 2020-08-23 LAB — TYPE AND SCREEN
ABO/RH(D): O POS
Antibody Screen: NEGATIVE
Unit division: 0

## 2020-08-23 LAB — GLUCOSE, CAPILLARY
Glucose-Capillary: 165 mg/dL — ABNORMAL HIGH (ref 70–99)
Glucose-Capillary: 199 mg/dL — ABNORMAL HIGH (ref 70–99)
Glucose-Capillary: 149 mg/dL — ABNORMAL HIGH (ref 70–99)
Glucose-Capillary: 139 mg/dL — ABNORMAL HIGH (ref 70–99)
Glucose-Capillary: 156 mg/dL — ABNORMAL HIGH (ref 70–99)
Glucose-Capillary: 142 mg/dL — ABNORMAL HIGH (ref 70–99)
Glucose-Capillary: 152 mg/dL — ABNORMAL HIGH (ref 70–99)

## 2020-08-23 LAB — PATHOLOGIST SMEAR REVIEW: Path Review: 4072022

## 2020-08-23 MED ORDER — QUETIAPINE FUMARATE 100 MG PO TABS
100.0000 mg | ORAL_TABLET | Freq: Two times a day (BID) | ORAL | Status: DC
  Administered 2020-08-23 – 2020-08-27 (×8): 100 mg
  Filled 2020-08-23 (×8): qty 1

## 2020-08-23 MED ORDER — CLONAZEPAM 1 MG PO TABS
1.0000 mg | ORAL_TABLET | Freq: Two times a day (BID) | ORAL | Status: DC
  Administered 2020-08-23 – 2020-08-27 (×8): 1 mg
  Filled 2020-08-23 (×8): qty 1

## 2020-08-23 MED ORDER — PANTOPRAZOLE SODIUM 40 MG PO PACK
40.0000 mg | PACK | Freq: Every day | ORAL | Status: DC
  Administered 2020-08-24 – 2020-10-09 (×47): 40 mg
  Filled 2020-08-23 (×47): qty 20

## 2020-08-23 MED ORDER — GUAIFENESIN 100 MG/5ML PO SOLN
15.0000 mL | Freq: Four times a day (QID) | ORAL | Status: DC
  Administered 2020-08-23 – 2020-09-13 (×82): 300 mg
  Filled 2020-08-23 (×6): qty 15
  Filled 2020-08-23: qty 45
  Filled 2020-08-23 (×11): qty 15
  Filled 2020-08-23: qty 45
  Filled 2020-08-23 (×2): qty 15
  Filled 2020-08-23: qty 45
  Filled 2020-08-23 (×28): qty 15
  Filled 2020-08-23: qty 45
  Filled 2020-08-23 (×28): qty 15

## 2020-08-23 NOTE — Progress Notes (Signed)
Patient ID: Charles Weeks, male   DOB: [birth year], 55 y.o.   MRN: [id number] Follow up - Trauma Critical Care  Patient Details:    Charles Weeks is an 55 y.o. male.  Lines/tubes : Airway 7.5 mm (Active)  Secured at (cm) 27 cm 08/23/20 0304  Measured From Lips 08/23/20 0304  Secured Location Right 08/23/20 0304  Secured By [employer] 08/23/20 0304  Tube Holder Repositioned Yes 08/23/20 0304  Prone position No 08/21/20 2359  Cuff Pressure (cm H2O) Green OR 18-26 [facility] 08/22/20 0757  Site Condition Dry 08/22/20 1516     CVC Triple Lumen 08/18/20 Right Subclavian (Active)  Indication for Insertion or Continuance of Line Vasoactive infusions 08/23/20 0800  Site Assessment Clean;Dry;Intact 08/23/20 0800  Proximal Lumen Status Infusing 08/23/20 0800  Medial Lumen Status Infusing 08/23/20 0800  Distal Lumen Status Saline locked 08/23/20 0800  Dressing Type Transparent;Occlusive 08/23/20 0800  Dressing Status Clean;Dry;Intact 08/23/20 0800  Antimicrobial disc in place? Yes 08/22/20 0800  Line Care Connections checked and tightened 08/23/20 0800  Dressing Intervention New dressing 08/18/20 1200  Dressing Change Due 08/25/20 08/23/20 0800     Arterial Line 08/21/20 Right Radial (Active)  Site Assessment Clean;Dry;Intact 08/23/20 0800  Line Status Pulsatile blood flow 08/23/20 0800  Art Line Waveform Appropriate 08/23/20 0800  Art Line Interventions Connections checked and tightened 08/23/20 0800  Color/Movement/Sensation Capillary refill less than 3 sec 08/23/20 0800  Dressing Type Transparent;Occlusive 08/23/20 0800  Dressing Status Clean;Intact;Dry;Antimicrobial disc in place 08/23/20 0800  Dressing Change Due 08/28/20 08/23/20 0800     Chest Tube Lateral;Right (Active)  Status To water seal 08/23/20 0800  Chest Tube Air Leak None 08/23/20 0800  Patency Intervention Tip/tilt 08/23/20 0800  Drainage Description Dark red 08/23/20 0800  Dressing Status Clean;Dry;Intact  08/23/20 0800  Dressing Intervention Dressing changed 08/18/20 1700  Site Assessment Clean;Dry;Intact 08/22/20 2000  Surrounding Skin Unable to view 08/23/20 0800  Output (mL) 150 mL 08/23/20 0600     NG/OG Tube Orogastric Center mouth Xray (Active)  External Length of Tube (cm) - (if applicable) 15.1 cm 76/16/07 0800  Site Assessment Clean;Dry;Intact 08/23/20 0800  Ongoing Placement Verification No change in cm markings or external length of tube from initial placement;No change in respiratory status;No acute changes, not attributed to clinical condition 08/23/20 0800  Status Infusing tube feed 08/23/20 0800  Drainage Appearance Brown 08/20/20 0800  Output (mL) 100 mL 08/20/20 1430     Rectal Tube/Pouch (Active)     Urethral Catheter Charles Weeks, NT Latex 16 Fr. (Active)  Indication for Insertion or Continuance of Catheter Acute urinary retention (I&O Cath for 24 hrs prior to catheter insertion- Inpatient Only) 08/23/20 0800  Site Assessment Clean;Intact 08/23/20 0800  Catheter Maintenance Bag below level of bladder;Catheter secured;Drainage bag/tubing not touching floor;Insertion date on drainage bag;No dependent loops;Seal intact 08/23/20 0800  Collection Container Standard drainage bag 08/23/20 0800  Securement Method Leg strap 08/23/20 0800  Urinary Catheter Interventions (if applicable) Unclamped 37/10/62 0800  Output (mL) 300 mL 08/23/20 0600    Microbiology/Sepsis markers: Results for orders placed or performed during the hospital encounter of 08/17/20  Resp Panel by RT-PCR (Flu A&B, Covid) Nasopharyngeal Swab     Status: None   Collection Time: 08/17/20  3:40 PM   Specimen: Nasopharyngeal Swab; Nasopharyngeal(NP) swabs in vial transport medium  Result Value Ref Range Status   SARS Coronavirus 2 by RT PCR NEGATIVE NEGATIVE Final    Comment: (NOTE) SARS-CoV-2 target nucleic acids are NOT  DETECTED.  The SARS-CoV-2 RNA is generally detectable in upper respiratory specimens  during the acute phase of infection. The lowest concentration of SARS-CoV-2 viral copies this assay can detect is 138 copies/mL. A negative result does not preclude SARS-Cov-2 infection and should not be used as the sole basis for treatment or other patient management decisions. A negative result may occur with  improper specimen collection/handling, submission of specimen other than nasopharyngeal swab, presence of viral mutation(s) within the areas targeted by this assay, and inadequate number of viral copies(<138 copies/mL). A negative result must be combined with clinical observations, patient history, and epidemiological information. The expected result is Negative.  Fact Sheet for Patients:  EntrepreneurPulse.com.au  Fact Sheet for Healthcare Providers:  IncredibleEmployment.be  This test is no t yet approved or cleared by the [place] FDA and  has been authorized for detection and/or diagnosis of SARS-CoV-2 by FDA under an Emergency Use Authorization (EUA). This EUA will remain  in effect (meaning this test can be used) for the duration of the COVID-19 declaration under Section 564(b)(1) of the Act, 21 U.S.C.section 360bbb-3(b)(1), unless the authorization is terminated  or revoked sooner.       Influenza A by PCR NEGATIVE NEGATIVE Final   Influenza B by PCR NEGATIVE NEGATIVE Final    Comment: (NOTE) The Xpert Xpress SARS-CoV-2/FLU/RSV plus assay is intended as an aid in the diagnosis of influenza from Nasopharyngeal swab specimens and should not be used as a sole basis for treatment. Nasal washings and aspirates are unacceptable for Xpert Xpress SARS-CoV-2/FLU/RSV testing.  Fact Sheet for Patients: EntrepreneurPulse.com.au  Fact Sheet for Healthcare Providers: IncredibleEmployment.be  This test is not yet approved or cleared by the [place] FDA and has been authorized for detection  and/or diagnosis of SARS-CoV-2 by FDA under an Emergency Use Authorization (EUA). This EUA will remain in effect (meaning this test can be used) for the duration of the COVID-19 declaration under Section 564(b)(1) of the Act, 21 U.S.C. section 360bbb-3(b)(1), unless the authorization is terminated or revoked.  Performed at [facility], [place] [address], [place], [place] [address]   Surgical PCR screen     Status: None   Collection Time: 08/19/20  7:38 AM   Specimen: Nasal Mucosa; Nasal Swab  Result Value Ref Range Status   MRSA, PCR NEGATIVE NEGATIVE Final   Staphylococcus aureus NEGATIVE NEGATIVE Final    Comment: (NOTE) The Xpert SA Assay (FDA approved for NASAL specimens in patients 81 years of age and older), is one component of a comprehensive surveillance program. It is not intended to diagnose infection nor to guide or monitor treatment. Performed at [facility], [place] [address], [place], [place] [address]     Anti-infectives:  Anti-infectives (From admission, onward)   Start     Dose/Rate Route Frequency Ordered Stop   08/19/20 0845  cefTRIAXone (ROCEPHIN) 2 g in sodium chloride 0.9 % 100 mL IVPB        2 g 200 mL/hr over 30 Minutes Intravenous Every 24 hours 08/19/20 0753     08/17/20 1515  ceFAZolin (ANCEF) IVPB 2g/100 mL premix        2 g 200 mL/hr over 30 Minutes Intravenous  Once 08/17/20 1500 08/17/20 1530      Best Practice/Protocols:  VTE Prophylaxis: Lovenox (prophylaxtic dose) Continous Sedation  Consults: Treatment Team:  Eustace Moore, MD    Studies:    Events:  Subjective:    Overnight Issues:   Objective:  Vital signs for last 24 hours: Temp:  [98 F (36.7 C)-99.6 F (37.6 C)] 98.2 F (36.8 C) (04/07 0400) Pulse Rate:  [58-86] 65 (04/07 0900) Resp:  [16-19] 18 (04/07 0900) BP: (107-143)/(57-81) 112/59 (04/07 0800) SpO2:  [90 %-97 %] 91 % (04/07 0900) Arterial Line BP: (121-160)/(55-89) 153/62 (04/07 0900) FiO2 (%):   [50 %-60 %] 50 % (04/07 0304)  Hemodynamic parameters for last 24 hours:    Intake/Output from previous day: 04/06 0701 - 04/07 0700 In: 2604.3 [I.V.:454.3; Blood:30; NG/GT:2020; IV Piggyback:100] Out: 2600 [Urine:2400; Chest Tube:200]  Intake/Output this shift: Total I/O In: 182.2 [I.V.:37.4; NG/GT:120; IV Piggyback:24.8] Out: -   Vent settings for last 24 hours: Vent Mode: PRVC FiO2 (%):  [50 %-60 %] 50 % Set Rate:  [18 bmp] 18 bmp Vt Set:  [480 mL] 480 mL PEEP:  [8 cmH20] 8 cmH20 Plateau Pressure:  [17 cmH20-30 cmH20] 18 cmH20  Physical Exam:  General: on vent Neuro: now sedated, moved foot to stim HEENT/Neck: ETT Resp: few rhonchi CVS: RRR GI: soft, some distention, stool in flexiseal Extremities: edema 1+  Results for orders placed or performed during the hospital encounter of 08/17/20 (from the past 24 hour(s))  Glucose, capillary     Status: Abnormal   Collection Time: 08/22/20 12:04 PM  Result Value Ref Range   Glucose-Capillary 149 (H) 70 - 99 mg/dL  Glucose, capillary     Status: Abnormal   Collection Time: 08/22/20  3:50 PM  Result Value Ref Range   Glucose-Capillary 174 (H) 70 - 99 mg/dL  Magnesium     Status: Abnormal   Collection Time: 08/22/20  4:23 PM  Result Value Ref Range   Magnesium 2.7 (H) 1.7 - 2.4 mg/dL  Phosphorus     Status: None   Collection Time: 08/22/20  4:23 PM  Result Value Ref Range   Phosphorus 3.8 2.5 - 4.6 mg/dL  Hemoglobin and hematocrit, blood     Status: Abnormal   Collection Time: 08/22/20  4:24 PM  Result Value Ref Range   Hemoglobin 7.3 (L) 13.0 - 17.0 g/dL   HCT 23.1 (L) 39.0 - 52.0 %  Glucose, capillary     Status: Abnormal   Collection Time: 08/22/20  7:48 PM  Result Value Ref Range   Glucose-Capillary 162 (H) 70 - 99 mg/dL  Glucose, capillary     Status: Abnormal   Collection Time: 08/22/20 11:32 PM  Result Value Ref Range   Glucose-Capillary 163 (H) 70 - 99 mg/dL  Glucose, capillary     Status: Abnormal    Collection Time: 08/23/20  3:46 AM  Result Value Ref Range   Glucose-Capillary 165 (H) 70 - 99 mg/dL  CBC     Status: Abnormal   Collection Time: 08/23/20  5:26 AM  Result Value Ref Range   WBC 15.4 (H) 4.0 - 10.5 K/uL   RBC 2.27 (L) 4.22 - 5.81 MIL/uL   Hemoglobin 7.1 (L) 13.0 - 17.0 g/dL   HCT 22.8 (L) 39.0 - 52.0 %   MCV 100.4 (H) 80.0 - 100.0 fL   MCH 31.3 26.0 - 34.0 pg   MCHC 31.1 30.0 - 36.0 g/dL   RDW 16.6 (H) 11.5 - 15.5 %   Platelets 168 150 - 400 K/uL   nRBC 5.9 (H) 0.0 - 0.2 %  Basic metabolic panel     Status: Abnormal   Collection Time: 08/23/20  5:26 AM  Result Value Ref Range   Sodium 144 135 - 145  mmol/L   Potassium 4.5 3.5 - 5.1 mmol/L   Chloride 104 98 - 111 mmol/L   CO2 37 (H) 22 - 32 mmol/L   Glucose, Bld 183 (H) 70 - 99 mg/dL   BUN 32 (H) 6 - 20 mg/dL   Creatinine, Ser 0.71 0.61 - 1.24 mg/dL   Calcium 7.8 (L) 8.9 - 10.3 mg/dL   GFR, Estimated >60 >60 mL/min   Anion gap 3 (L) 5 - 15  Glucose, capillary     Status: Abnormal   Collection Time: 08/23/20  7:14 AM  Result Value Ref Range   Glucose-Capillary 199 (H) 70 - 99 mg/dL    Assessment & Plan: Present on Admission: . Thoracic spine fracture (HCC)    LOS: 6 days   Additional comments:I reviewed the patient's new clinical lab test results. and CXR Fall from height  R PTX- CT in place on water seal, 120cc out overnight so continue today Multiple B rib fxs- these are at the junction of the thoracic vertebral bodies, pain control, IS 3 column T8 vertebral body fx- per Dr. Ronnald Ramp, non-op now, plan mobilize in brace once off vent. Needs CTO brace for HOB>30 degrees T7 neural arch fx- per nsgy R TVP T4-8 - pain control, per nsgy R C7 TVP fx through foramen- C collar per Dr. Ronnald Ramp, CTAnegatvie Large scalp laceration/right eyebrow laceration- repair by Dr. Lovick4/05/2020, sutures need to be removed 08/27/20 Right neck abrasion/skin tear- bacitracin BID Facial ecchymosis- CT  maxillofacialdemonstrates no facial bone fractures. Some anterior subluxation of the right mandibular condyle HTN- home lopressor 25m daily, restarted at half dose ad HR lower at times CAD/H/O MI/DES- on ASA at home. Hold for now. 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 Solumedrol x5d per CCM recs, restarted home inhalers 4/8 Hypotension - resolved, off pressors Depression/anxiety- restart home bupropion when off the vent, restarted home buspar BPH- urecholine, failed voiding trial, replace foley today, restart home terazosin at half dose ABL anemia - hgb 7.1 S/P 1u PRBC yesterday FEN-TF, replete K and phos, escalate bowel regimen, if no BM today, add mag citrate VTE-LMWH Dispo - ICU, lasix x 1, add guaifenesin, increase Klon/Sero I met with his sister at length. We discussed his status including ongoing weaning efforts. We also discussed possible trach/PEG. She is hoping he can avoid a trach as she had one in the past (Dr. SHalford Chessmanis her pulmonologist). She also shared that she is undergoing an abdominoplasty next Monday by Dr. PClaudia Desanctis Critical Care Total Time*: 42 Minutes  BGeorganna Skeans MD, MPH, FACS Trauma & General Surgery Use [email or web address] to contact on call provider  08/23/2020  *Care during the described time interval was provided by me. I have reviewed this patient's available data, including medical history, events of note, physical examination and test results as part of my evaluation.

## 2020-08-23 NOTE — Progress Notes (Signed)
Subjective: Patient still intubated and sedated, unable to [facility].  Objective: Vital signs in last 24 hours: Temp:  [98 F (36.7 C)-99.6 F (37.6 C)] 98.2 F (36.8 C) (04/07 0400) Pulse Rate:  [58-86] 61 (04/07 0600) Resp:  [16-19] 19 (04/07 0600) BP: (107-143)/(57-81) 107/57 (04/07 0600) SpO2:  [90 %-97 %] 95 % (04/07 0600) Arterial Line BP: (121-160)/(55-89) 143/55 (04/07 0600) FiO2 (%):  [50 %-60 %] 50 % (04/07 0304)  Intake/Output from previous day: 04/06 0701 - 04/07 0700 In: 2604.3 [I.V.:454.3; Blood:30; NG/GT:2020; IV Piggyback:100] Out: 2600 [Urine:2400; Chest Tube:200] Intake/Output this shift: No intake/output data recorded.    Lab Results: Lab Results  Component Value Date   WBC 15.4 (H) 08/23/2020   HGB 7.1 (L) 08/23/2020   HCT 22.8 (L) 08/23/2020   MCV 100.4 (H) 08/23/2020   PLT 168 08/23/2020   Lab Results  Component Value Date   INR 1.2 08/17/2020   BMET Lab Results  Component Value Date   NA 144 08/23/2020   K 4.5 08/23/2020   CL 104 08/23/2020   CO2 37 (H) 08/23/2020   GLUCOSE 183 (H) 08/23/2020   BUN 32 (H) 08/23/2020   CREATININE 0.71 08/23/2020   CALCIUM 7.8 (L) 08/23/2020    Studies/Results: DG ELBOW COMPLETE RIGHT (3+VIEW)  Result Date: 08/22/2020 CLINICAL DATA:  Fall on 08/17/2020.  Right elbow swelling. EXAM: RIGHT ELBOW - COMPLETE 3+ VIEW COMPARISON:  None. FINDINGS: Right elbow is located without a fracture or large joint effusion. Alignment of the elbow is normal. Tiny density in the anterior soft tissues of the lower humeral region is nonspecific but this could represent a foreign body or soft tissue calcification. Evidence for diffuse soft tissue swelling. IMPRESSION: 1. No acute bone abnormality to the right elbow. 2. Evidence for soft tissue edema. 3. Small calcification or density in the anterior soft tissues as described. Electronically Signed   By: Richarda Overlie M.D.   On: 08/22/2020 13:03   DG CHEST PORT 1 VIEW  Result Date:  08/21/2020 CLINICAL DATA:  Right pneumothorax. EXAM: PORTABLE CHEST 1 VIEW COMPARISON:  August 19, 2020. FINDINGS: The heart size and mediastinal contours are within normal limits. Endotracheal and nasogastric tubes are unchanged in position. Right-sided chest tube is noted. No definite pneumothorax is noted currently. Increased left perihilar and basilar opacity is noted concerning for atelectasis or pneumonia with associated pleural effusion. The visualized skeletal structures are unremarkable. IMPRESSION: Increased left lung opacity as described above. Stable support apparatus. Electronically Signed   By: Lupita Raider M.D.   On: 08/21/2020 10:48    Assessment/Plan: Per the nurse, he had to be taken off the vent and bagged last night. Still unstable from a pulmonary standpoint. Will continue to hold off on surgery at this point. Would like to give him a chance in a brace when stable to sit up.   LOS: 6 days    Charles Weeks 08/23/2020, 8:06 AM

## 2020-08-24 DIAGNOSIS — J9620 Acute and chronic respiratory failure, unspecified whether with hypoxia or hypercapnia: Secondary | ICD-10-CM

## 2020-08-24 DIAGNOSIS — S22000A Wedge compression fracture of unspecified thoracic vertebra, initial encounter for closed fracture: Secondary | ICD-10-CM

## 2020-08-24 DIAGNOSIS — J939 Pneumothorax, unspecified: Secondary | ICD-10-CM | POA: Diagnosis not present

## 2020-08-24 DIAGNOSIS — Z515 Encounter for palliative care: Secondary | ICD-10-CM

## 2020-08-24 LAB — GLUCOSE, CAPILLARY
Glucose-Capillary: 132 mg/dL — ABNORMAL HIGH (ref 70–99)
Glucose-Capillary: 138 mg/dL — ABNORMAL HIGH (ref 70–99)
Glucose-Capillary: 142 mg/dL — ABNORMAL HIGH (ref 70–99)
Glucose-Capillary: 154 mg/dL — ABNORMAL HIGH (ref 70–99)
Glucose-Capillary: 157 mg/dL — ABNORMAL HIGH (ref 70–99)
Glucose-Capillary: 160 mg/dL — ABNORMAL HIGH (ref 70–99)

## 2020-08-24 LAB — CBC
HCT: 24.7 % — ABNORMAL LOW (ref 39.0–52.0)
Hemoglobin: 7.3 g/dL — ABNORMAL LOW (ref 13.0–17.0)
MCH: 31.3 pg (ref 26.0–34.0)
MCHC: 29.6 g/dL — ABNORMAL LOW (ref 30.0–36.0)
MCV: 106 fL — ABNORMAL HIGH (ref 80.0–100.0)
Platelets: 208 10*3/uL (ref 150–400)
RBC: 2.33 MIL/uL — ABNORMAL LOW (ref 4.22–5.81)
RDW: 17.4 % — ABNORMAL HIGH (ref 11.5–15.5)
WBC: 15.6 10*3/uL — ABNORMAL HIGH (ref 4.0–10.5)
nRBC: 8.8 % — ABNORMAL HIGH (ref 0.0–0.2)

## 2020-08-24 LAB — TRIGLYCERIDES: Triglycerides: 206 mg/dL — ABNORMAL HIGH (ref <150)

## 2020-08-24 LAB — BASIC METABOLIC PANEL
Anion gap: 3 — ABNORMAL LOW (ref 5–15)
BUN: 35 mg/dL — ABNORMAL HIGH (ref 6–20)
CO2: 38 mmol/L — ABNORMAL HIGH (ref 22–32)
Calcium: 7.8 mg/dL — ABNORMAL LOW (ref 8.9–10.3)
Chloride: 105 mmol/L (ref 98–111)
Creatinine, Ser: 0.56 mg/dL — ABNORMAL LOW (ref 0.61–1.24)
GFR, Estimated: >60 mL/min (ref >60)
Glucose, Bld: 146 mg/dL — ABNORMAL HIGH (ref 70–99)
Potassium: 4.1 mmol/L (ref 3.5–5.1)
Sodium: 146 mmol/L — ABNORMAL HIGH (ref 135–145)

## 2020-08-24 MED ORDER — FUROSEMIDE 10 MG/ML IJ SOLN
20.0000 mg | Freq: Once | INTRAMUSCULAR | Status: AC
  Administered 2020-08-24: 20 mg via INTRAVENOUS
  Filled 2020-08-24: qty 2

## 2020-08-24 MED ORDER — TERAZOSIN HCL 1 MG PO CAPS
2.0000 mg | ORAL_CAPSULE | Freq: Every day | ORAL | Status: DC
  Administered 2020-08-24 – 2020-10-09 (×47): 2 mg
  Filled 2020-08-24 (×47): qty 2

## 2020-08-24 NOTE — Progress Notes (Signed)
Trauma/Critical Care Follow Up Note  Subjective:    Overnight Issues:   Objective:  Vital signs for last 24 hours: Temp:  [98.3 F (36.8 C)-100.2 F (37.9 C)] 99.1 F (37.3 C) (04/08 1200) Pulse Rate:  [70-106] 87 (04/08 1400) Resp:  [15-20] 18 (04/08 1400) BP: (99-150)/(53-94) 102/57 (04/08 1400) SpO2:  [89 %-97 %] 93 % (04/08 1400) Arterial Line BP: (80-165)/(46-104) 114/46 (04/08 1400) FiO2 (%):  [40 %-50 %] 40 % (04/08 1153)  Hemodynamic parameters for last 24 hours:    Intake/Output from previous day: 04/07 0701 - 04/08 0700 In: 3259 [I.V.:1659; NG/GT:1500; IV Piggyback:100.1] Out: 2265 [Urine:1925; Chest Tube:340]  Intake/Output this shift: Total I/O In: 1020.1 [I.V.:500.1; NG/GT:420; IV Piggyback:100.1] Out: -   Vent settings for last 24 hours: Vent Mode: PRVC FiO2 (%):  [40 %-50 %] 40 % Set Rate:  [18 bmp] 18 bmp Vt Set:  [480 mL] 480 mL PEEP:  [8 cmH20] 8 cmH20 Plateau Pressure:  [22 cmH20-28 cmH20] 28 cmH20  Physical Exam:  Gen: comfortable, no distress Neuro: not f/c for me this AM HEENT: PERRL Neck: c-collar CV: RRR Pulm: unlabored breathing Abd: soft, NT GU: clear yellow urine, foley Extr: wwp, no edema   Results for orders placed or performed during the hospital encounter of 08/17/20 (from the past 24 hour(s))  Glucose, capillary     Status: Abnormal   Collection Time: 08/23/20  4:07 PM  Result Value Ref Range   Glucose-Capillary 156 (H) 70 - 99 mg/dL  Glucose, capillary     Status: Abnormal   Collection Time: 08/23/20  7:26 PM  Result Value Ref Range   Glucose-Capillary 142 (H) 70 - 99 mg/dL  Glucose, capillary     Status: Abnormal   Collection Time: 08/23/20 11:17 PM  Result Value Ref Range   Glucose-Capillary 152 (H) 70 - 99 mg/dL  Glucose, capillary     Status: Abnormal   Collection Time: 08/24/20  3:36 AM  Result Value Ref Range   Glucose-Capillary 160 (H) 70 - 99 mg/dL  Triglycerides     Status: Abnormal   Collection Time:  08/24/20  5:35 AM  Result Value Ref Range   Triglycerides 206 (H) <150 mg/dL  CBC     Status: Abnormal   Collection Time: 08/24/20  5:35 AM  Result Value Ref Range   WBC 15.6 (H) 4.0 - 10.5 K/uL   RBC 2.33 (L) 4.22 - 5.81 MIL/uL   Hemoglobin 7.3 (L) 13.0 - 17.0 g/dL   HCT 35.0 (L) 09.3 - 81.8 %   MCV 106.0 (H) 80.0 - 100.0 fL   MCH 31.3 26.0 - 34.0 pg   MCHC 29.6 (L) 30.0 - 36.0 g/dL   RDW 29.9 (H) 37.1 - 69.6 %   Platelets 208 150 - 400 K/uL   nRBC 8.8 (H) 0.0 - 0.2 %  Basic metabolic panel     Status: Abnormal   Collection Time: 08/24/20  5:35 AM  Result Value Ref Range   Sodium 146 (H) 135 - 145 mmol/L   Potassium 4.1 3.5 - 5.1 mmol/L   Chloride 105 98 - 111 mmol/L   CO2 38 (H) 22 - 32 mmol/L   Glucose, Bld 146 (H) 70 - 99 mg/dL   BUN 35 (H) 6 - 20 mg/dL   Creatinine, Ser 7.89 (L) 0.61 - 1.24 mg/dL   Calcium 7.8 (L) 8.9 - 10.3 mg/dL   GFR, Estimated >38 >10 mL/min   Anion gap 3 (L) 5 - 15  Glucose, capillary     Status: Abnormal   Collection Time: 08/24/20  7:51 AM  Result Value Ref Range   Glucose-Capillary 154 (H) 70 - 99 mg/dL  Glucose, capillary     Status: Abnormal   Collection Time: 08/24/20 11:48 AM  Result Value Ref Range   Glucose-Capillary 132 (H) 70 - 99 mg/dL    Assessment & Plan: The plan of care was discussed with the bedside nurse for the day, who is in agreement with this plan and no additional concerns were raised.   Present on Admission: . Thoracic spine fracture (HCC)    LOS: 7 days   Additional comments:I reviewed the patient's new clinical lab test results.   and I reviewed the patients new imaging test results.    Fall from height  R PTX- CT in place on water seal, 340cc out overnight so continue today Multiple B rib fxs- these are at the junction of the thoracic vertebral bodies, pain control, IS 3 column T8 vertebral body fx-perDr. Jones,non-op now, plan mobilize in brace once off vent. Needs CTO brace for HOB>30 degrees T7  neural arch fx- per nsgy R TVP T4-8 - pain control, per nsgy R C7 TVP fx through foramen- C collar per Dr. Yetta Barre, CTAnegatvie Large scalp laceration/right eyebrow laceration- repair by Dr. Lovick4/05/2020, sutures need to be removed4/11/22 Right neck abrasion/skin tear- bacitracin BID Facial ecchymosis- CT maxillofacialdemonstrates no facial bone fractures. Some anterior subluxation of the right mandibular condyle HTN- home lopressor 25mg  daily, restarted at half dose ad HR lower at times CAD/H/O MI/DES- on ASA at home. Hold for now. 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 Wean as tolerated to maintain sats >88%. Solumedrol x5d per CCM recs, restarted home inhalers 4/8 Hypotension- resolved, off pressors Depression/anxiety- restart home bupropion when taking PO, restarted home buspar BPH- urecholine, failed voiding trial, replaced foley 4/8, remove today, restarted home terazosin at half dose 4/6, increase to home dose today ABL anemia- hgb stable FEN-TF, replete K and phos, escalate bowel regimen, if no BM today, add mag citrate VTE-LMWH Dispo- ICU, palliative care engaged for [facility] regarding trach/PEG  Lengthy family update provided to the sister at the bedside. We discussed on 4/6 the likelihood of tracheostomy, which I reiterated today would be a high likelihood given his inability to briskly and reliably follow commands. Attempted to reach both daughters to discuss, no answer at the number listed for [name] and number not in service for the number listed for [name].   Critical Care Total Time: 45 minutes  6/6, MD Trauma & General Surgery Please use [email or web address] to contact on call provider  08/24/2020  *Care during the described time interval was provided by me. I have reviewed this patient's available data, including medical history, events of note, physical examination and test results as part of  my evaluation.

## 2020-08-24 NOTE — Consult Note (Signed)
Consultation Note Date: 08/24/2020   Patient Name: Charles Weeks  DOB: [birth year]  MRN: [id number]  Age / Sex: 55 y.o., male  PCP: No primary care provider on file. Referring Physician: Md, Trauma, MD  Reason for Consultation: Establishing goals of care  HPI/Patient Profile: 55 y.o. male with past medical history of COPD gold, CAD, depression and anxiety who was admitted on 08/17/2020 after a fall from a significant height.  He was noted to have a large right pneumothorax requiring chest tube placement, multiple bilateral rib fractures, T8 fracture, C7 fracture.  Neurosurgery evaluated the patient and felt the fractures were non-operable.  Charles Weeks remains intubated.  He has had difficulty with thick secretions and agitation on the vent requiring propofol.  We were consulted for goals of care.   Clinical Assessment and Goals of Care:  I have reviewed medical records including EPIC notes, labs and imaging, received report from ICU RN April, examined the patient and met at bedside with with patient's daughter Charles Weeks to discuss diagnosis prognosis, GOC, EOL wishes, disposition and options.  At one point in the conversation we added on the patient's other daughter Charles Weeks for the discussion.  I introduced Palliative Medicine as specialized medical care for people living with serious illness. It focuses on providing relief from the symptoms and stress of a serious illness.   Unfortunately Charles Weeks and Charles Weeks just lost their mother 5 months ago to cancer.  When they were invited into the hospital in [place] to meet with Palliative Care their mother died very quickly afterward.  Consequently both Charles Weeks and Charles Weeks were quite shaken to hear from Charles Weeks about their father.  We discussed a brief life review of the patient. 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  He and his ex-wife were married for 18 years and  were amicable even after the divorce many years ago.  After the divorce their father lived in several different states but eventually moved in with his mother to care for her during the late stages of her life.     In June of 2021 Charles Weeks came Charles Weeks for a visit.   [name] had always been like a mother to him.  He stayed with her and ended up living there.  Charles Weeks [place] 45 minutes away [place].  Charles Weeks would leave Charles Weeks's house and stay with Charles Weeks or Charles Weeks for a week at a time.  Charles Weeks describes how nice it was to have her father back in her life and how much he enjoyed the grandchildren.    As far as functional and nutritional status Charles Weeks describes Charles Weeks as independent with ADLs but he would not do grocery shopping etc as he would become too winded from COPD.  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  We discussed his current illness and what it means in the larger context of his on-going co-morbidities.  Natural disease trajectory and expectations at  EOL were discussed.  Charles Weeks and Charles Weeks understand that their father is critical and they acknowledge that Charles Weeks has very bad lungs.  He also had difficulty with bleeding.  Both of these issues are making his current situation more difficult.    Per nursing Charles Weeks was awake and following commands this morning for a brief time.  He quickly became agitated and was dis synchronous with the vent so medications were given to relax him.  We talked about Charles Weeks possibly needing a Trach/Peg to allow for him to come off the vent. Charles Weeks is a very spiritual person and she is hopeful that God will pull Charles Weeks thru this ordeal.   Having just lost her mother and having her father back for only a short period she and her sister really want to give him some time (months) to see if he can improve.    I inferred from Charles Weeks that she wouldn't want her father connected to tubes living in  a nursing home for the rest of his life - but if there was a chance he could regain function and live on - then she wanted to provide him with that opportunity.    We discussed code status - should we do chest compressions given his fractures.  I advised against chest compressions.  Charles Weeks and Charles Weeks were both in agreement however that they wanted their father resuscitated if his heart should stop.  I attempted to elicit values and goals of care important to the patient.  Charles Weeks expressed that she had never had these types of detailed conversations with her father, but she was certain he would want resuscitation.  Questions and concerns were addressed.  The family was encouraged to call with questions or concerns.    Primary Decision Maker:  Daughters Scientist, clinical (histocompatibility and immunogenetics) and Charles Weeks are the patient's decision makers.    SUMMARY OF RECOMMENDATIONS    There is discord amongst family members with regard to the events surrounding patient's fall.    Please facilitate information flow to Charles Weeks and Charles Weeks as they are [employer].  [name]'s pain is likely severe and contributing to significant agitation.  I'm concerned that opioids necessary to control his pain may make it more difficult for him to come off of the vent.  Would consider a trial of Ketamine for pain control without respiratory depression.  Charles Weeks and Charles Weeks are in agreement with placing (hopefully temporary) trach / PEG if needed to allow their father more time to heal.   Code Status/Advance Care Planning:  Full code   Symptom Management:   I noticed Ketamine and Fentanyl were used earlier in the hospitalization for [name].  If he tolerated it well it may be helpful to shift back to that regimen.  The addition of low dose benzodiazepine is often helpful with this combination as well - in order to prevent CNS toxicity   Additional Recommendations (Limitations, Scope, Preferences):  Full Scope  Treatment  Palliative Prophylaxis:   Frequent Pain Assessment  Psycho-social/Spiritual:   Desire for further Chaplaincy support: welcomed.  Prognosis:  Unable to determine at this point.  Patient is at high risk of acute decline or even death.    Discharge Planning: To Be Determined      Primary Diagnoses: Present on Admission: . Thoracic spine fracture ([place])   I have reviewed the medical record, interviewed the patient and family, and examined the patient. The following aspects are pertinent.  Past Medical History:  Diagnosis Date  . BPH (  benign prostatic hyperplasia) 08/17/2020  . CAD (coronary artery disease) 08/17/2020  . COPD (chronic obstructive pulmonary disease) ([place]) 08/17/2020   gold  . HTN (hypertension) 08/17/2020   Social History   Socioeconomic History  . Marital status: Single    Spouse name: Not on file  . Number of children: Not on file  . Years of education: Not on file  . Highest education level: Not on file  Occupational History  . Not on file  Tobacco Use  . Smoking status: Current Every Day Smoker  . Smokeless tobacco: Not on file  Substance and Sexual Activity  . Alcohol use: Yes  . Drug use: Not Currently  . Sexual activity: Not on file  Other Topics Concern  . Not on file  Social History Narrative  . Not on file   Social Determinants of Health   Financial Resource Strain: Not on file  Food Insecurity: Not on file  Transportation Needs: Not on file  Physical Activity: Not on file  Stress: Not on file  Social Connections: Not on file   History reviewed. No pertinent family history.  No Known Allergies    Vital Signs: BP 119/65   Pulse 88   Temp 98.3 F (36.8 C) (Axillary)   Resp 18   Ht _0  (1.727 m)   Wt 85.7 kg   SpO2 92%   BMI 28.74 kg/m  Pain Scale: CPOT POSS *See Group Information*: 2-Acceptable,Slightly drowsy, easily aroused     SpO2: SpO2: 92 % O2 Device:SpO2: 92 % O2 Flow Rate: .O2 Flow Rate  (L/min): 10 L/min    Palliative Assessment/Data: 10%     Time In: 12:00 Time Out:  13:05 Time Total: 65 min. Visit consisted of counseling and education dealing with the complex and emotionally intense issues surrounding the need for palliative care and symptom management in the setting of serious and potentially life-threatening illness. Greater than 50%  of this time was spent counseling and coordinating care related to the above assessment and plan.  Signed by: Florentina Jenny, PA-C Palliative Medicine  Please contact Palliative Medicine Team phone at [phone] for questions and concerns.  For individual provider: See Shea Evans

## 2020-08-25 ENCOUNTER — Inpatient Hospital Stay (HOSPITAL_COMMUNITY): Payer: Medicaid Other

## 2020-08-25 LAB — BASIC METABOLIC PANEL
Anion gap: 4 — ABNORMAL LOW (ref 5–15)
BUN: 33 mg/dL — ABNORMAL HIGH (ref 6–20)
CO2: 40 mmol/L — ABNORMAL HIGH (ref 22–32)
Calcium: 7.9 mg/dL — ABNORMAL LOW (ref 8.9–10.3)
Chloride: 104 mmol/L (ref 98–111)
Creatinine, Ser: 0.77 mg/dL (ref 0.61–1.24)
GFR, Estimated: >60 mL/min (ref >60)
Glucose, Bld: 153 mg/dL — ABNORMAL HIGH (ref 70–99)
Potassium: 4.2 mmol/L (ref 3.5–5.1)
Sodium: 148 mmol/L — ABNORMAL HIGH (ref 135–145)

## 2020-08-25 LAB — CBC
HCT: 24.1 % — ABNORMAL LOW (ref 39.0–52.0)
Hemoglobin: 7.3 g/dL — ABNORMAL LOW (ref 13.0–17.0)
MCH: 32 pg (ref 26.0–34.0)
MCHC: 30.3 g/dL (ref 30.0–36.0)
MCV: 105.7 fL — ABNORMAL HIGH (ref 80.0–100.0)
Platelets: 203 10*3/uL (ref 150–400)
RBC: 2.28 MIL/uL — ABNORMAL LOW (ref 4.22–5.81)
RDW: 17.9 % — ABNORMAL HIGH (ref 11.5–15.5)
WBC: 13.4 10*3/uL — ABNORMAL HIGH (ref 4.0–10.5)
nRBC: 2.7 % — ABNORMAL HIGH (ref 0.0–0.2)

## 2020-08-25 LAB — GLUCOSE, CAPILLARY
Glucose-Capillary: 152 mg/dL — ABNORMAL HIGH (ref 70–99)
Glucose-Capillary: 149 mg/dL — ABNORMAL HIGH (ref 70–99)
Glucose-Capillary: 151 mg/dL — ABNORMAL HIGH (ref 70–99)
Glucose-Capillary: 145 mg/dL — ABNORMAL HIGH (ref 70–99)
Glucose-Capillary: 159 mg/dL — ABNORMAL HIGH (ref 70–99)
Glucose-Capillary: 130 mg/dL — ABNORMAL HIGH (ref 70–99)

## 2020-08-25 MED ORDER — THIAMINE HCL 100 MG PO TABS
100.0000 mg | ORAL_TABLET | Freq: Every day | ORAL | Status: DC
  Administered 2020-08-25 – 2020-10-09 (×46): 100 mg
  Filled 2020-08-25 (×46): qty 1

## 2020-08-25 MED ORDER — ADULT MULTIVITAMIN W/MINERALS CH
1.0000 | ORAL_TABLET | Freq: Every day | ORAL | Status: DC
  Administered 2020-08-25 – 2020-10-09 (×46): 1
  Filled 2020-08-25 (×46): qty 1

## 2020-08-25 MED ORDER — FREE WATER
200.0000 mL | Status: DC
  Administered 2020-08-25 – 2020-10-04 (×237): 200 mL

## 2020-08-25 MED ORDER — FOLIC ACID 1 MG PO TABS
1.0000 mg | ORAL_TABLET | Freq: Every day | ORAL | Status: DC
  Administered 2020-08-25 – 2020-10-09 (×46): 1 mg
  Filled 2020-08-25 (×45): qty 1

## 2020-08-25 NOTE — Progress Notes (Signed)
Spoke with pt daughter Bridgett Larsson on phone, provided update and answered all questions. Daughter expressing concern that patient's sister has been attempting to make medical decisions for patient over his children (his 2 daughters). Patient not married and so his children would be NOK, assured daughter of this fact.   Aris Lot, RN

## 2020-08-25 NOTE — Progress Notes (Signed)
Bladder scan = 556 mL. I/O cath produced clear amber urine.

## 2020-08-25 NOTE — Plan of Care (Signed)
  Problem: Education: Goal: Knowledge of General Education information will improve Description: Including pain rating scale, medication(s)/side effects and non-pharmacologic comfort measures 08/25/2020 0020 by Santa Lighter, RN Outcome: Progressing  Problem: Health Behavior/Discharge Planning: Goal: Ability to manage health-related needs will improve 08/25/2020 0020 by Santa Lighter, RN Outcome: Progressing  Problem: Clinical Measurements: Goal: Ability to maintain clinical measurements within normal limits will improve 08/25/2020 0020 by Santa Lighter, RN Outcome: Progressing Goal: Will remain free from infection 08/25/2020 0020 by Santa Lighter, RN Outcome: Progressing Goal: Diagnostic test results will improve 08/25/2020 0020 by Santa Lighter, RN Outcome: Progressing Goal: Respiratory complications will improve 08/25/2020 0020 by Santa Lighter, RN Outcome: Progressing Goal: Cardiovascular complication will be avoided 08/25/2020 0020 by Santa Lighter, RN Outcome: Progressing   Problem: Activity: Goal: Risk for activity intolerance will decrease 08/25/2020 0020 by Santa Lighter, RN Outcome: Progressing   Problem: Nutrition: Goal: Adequate nutrition will be maintained 08/25/2020 0020 by Santa Lighter, RN Outcome: Progressing   Problem: Coping: Goal: Level of anxiety will decrease 08/25/2020 0020 by Santa Lighter, RN Outcome: Progressing  Problem: Elimination: Goal: Will not experience complications related to bowel motility 08/25/2020 0020 by Santa Lighter, RN Outcome: Progressing Goal: Will not experience complications related to urinary retention 08/25/2020 0020 by Santa Lighter, RN Outcome: Progressing   Problem: Pain Managment: Goal: General experience of comfort will improve 08/25/2020 0020 by Santa Lighter, RN Outcome: Progressing   Problem: Safety: Goal: Ability to remain free from injury will improve 08/25/2020  0020 by Santa Lighter, RN Outcome: Progressing  Problem: Skin Integrity: Goal: Risk for impaired skin integrity will decrease 08/25/2020 0020 by Santa Lighter, RN Outcome: Progressing   Problem: Safety: Goal: Non-violent Restraint(s) 08/25/2020 0020 by Santa Lighter, RN Outcome: Completed/Met

## 2020-08-25 NOTE — Progress Notes (Signed)
6 Days Post-Op   Subjective/Chief Complaint: Pt with no acute changes    Objective: Vital signs in last 24 hours: Temp:  [98 F (36.7 C)-99.8 F (37.7 C)] 99.8 F (37.7 C) (04/09 0400) Pulse Rate:  [83-99] 95 (04/09 0600) Resp:  [11-24] 18 (04/09 0600) BP: (99-157)/(53-90) 117/61 (04/09 0600) SpO2:  [89 %-97 %] 94 % (04/09 0600) Arterial Line BP: (106-157)/(46-104) 128/54 (04/09 0600) FiO2 (%):  [40 %-45 %] 45 % (04/09 0311) Weight:  [97.1 kg] 97.1 kg (04/09 0500) Last BM Date: 08/24/20  Intake/Output from previous day: 04/08 0701 - 04/09 0700 In: 1565.8 [I.V.:745.7; NG/GT:720; IV Piggyback:100.1] Out: 2060 [Urine:1740; Stool:50; Chest Tube:270] Intake/Output this shift: No intake/output data recorded.   Physical Exam:  Gen: comfortable, no distress Neuro: not f/c for me this AM HEENT: PERRL Neck: c-collar CV: RRR Pulm: unlabored breathing Abd: soft, NT GU: clear yellow urine, foley Extr: wwp, no edema   Lab Results:  Recent Labs    08/24/20 0535 08/25/20 0520  WBC 15.6* 13.4*  HGB 7.3* 7.3*  HCT 24.7* 24.1*  PLT 208 203   BMET Recent Labs    08/24/20 0535 08/25/20 0520  NA 146* 148*  K 4.1 4.2  CL 105 104  CO2 38* 40*  GLUCOSE 146* 153*  BUN 35* 33*  CREATININE 0.56* 0.77  CALCIUM 7.8* 7.9*   PT/INR No results for input(s): LABPROT, INR in the last 72 hours. ABG No results for input(s): PHART, HCO3 in the last 72 hours.  Invalid input(s): PCO2, PO2  Studies/Results: DG CHEST PORT 1 VIEW  Result Date: 08/23/2020 CLINICAL DATA:  55 year old male status post trauma with right pneumothorax treated with chest tube. Vomiting this morning. EXAM: PORTABLE CHEST 1 VIEW COMPARISON:  Portable chest 08/21/2020 and earlier. FINDINGS: Portable AP semi upright view at 0908 hours. Stable pigtail right chest tube. Stable right subclavian central line. Endotracheal tube tip is stable just below the clavicles. Enteric tube is stable with side hole at the level  of the GE junction. No pneumothorax identified. Mediastinal contours remain within normal limits. Veiling left lung base opacity has regressed but is increased at the right base. Dense bilateral retrocardiac opacity persists. Paucity of bowel gas in the upper abdomen. IMPRESSION: 1. Stable lines and tubes, enteric tube side hole at the level of the GE J advance 5 cm to ensure side hole placement within the stomach. 2. No pneumothorax identified. Bilateral pleural effusions suspected, increased on the right and stable to decreased on the left. 3. Bilateral lower lobe collapse or consolidation. Electronically Signed   By: Odessa Fleming M.D.   On: 08/23/2020 09:29    Anti-infectives: Anti-infectives (From admission, onward)   Start     Dose/Rate Route Frequency Ordered Stop   08/19/20 0845  cefTRIAXone (ROCEPHIN) 2 g in sodium chloride 0.9 % 100 mL IVPB        2 g 200 mL/hr over 30 Minutes Intravenous Every 24 hours 08/19/20 0753     08/17/20 1515  ceFAZolin (ANCEF) IVPB 2g/100 mL premix        2 g 200 mL/hr over 30 Minutes Intravenous  Once 08/17/20 1500 08/17/20 1530      Assessment/Plan: Fall from height  R PTX- CT in place on water seal, 340cc out overnight so continue today Multiple B rib fxs- these are at the junction of the thoracic vertebral bodies, pain control, IS 3 column T8 vertebral body fx-perDr. Jones,non-op now, plan mobilize in brace once off vent. Needs  CTO brace for HOB>30 degrees T7 neural arch fx- per nsgy R TVP T4-8 - pain control, per nsgy R C7 TVP fx through foramen- C collar per Dr. Yetta Barre, CTAnegatvie Large scalp laceration/right eyebrow laceration- repair by Dr. Lovick4/05/2020, sutures need to be removed4/11/22 Right neck abrasion/skin tear- bacitracin BID Facial ecchymosis- CT maxillofacialdemonstrates no facial bone fractures. Some anterior subluxation of the right mandibular condyle HTN- home lopressor 25mg  daily, restartedat half dose ad HR lower at  times CAD/H/O MI/DES- on ASA at home. Hold for now. 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 Wean as tolerated to maintain sats >88%. Solumedrol x5d per CCM recs, restartedhome inhalers 4/8 Hypotension- resolved, off pressors Depression/anxiety- restart home bupropion when taking PO, restarted home buspar BPH- urecholine, failed voiding trial, replaced foley 4/8,  restarted home terazosin at half dose 4/6, increase to home dose today ABL anemia- hgbstable FEN-TF, escalate bowel regimen, if no BM today, add mag citrate VTE-LMWH Dispo- ICU, palliative care engaged for [facility] regarding trach/PEG    Critical Care Total Time: 30 minutes   LOS: 8 days    [facility] 08/25/2020

## 2020-08-25 NOTE — Progress Notes (Signed)
Patient with decreased urine output. Bladder scan with 896 ml. In/Out Cath resulted 1200 ml of urine. Will continue to monitor patient.

## 2020-08-25 NOTE — Progress Notes (Signed)
Assisted family with tele-visit via elink 

## 2020-08-25 NOTE — Progress Notes (Signed)
PMT  I spoke with [name] on the phone.  She's planning on coming in this evening.  We made a plan for a family meeting 4/9 at 12:00. Will call [name] in the morning to confirm exactly who is coming and at what time.  Norvel Richards, PA-C Palliative Medicine Office:  [phone]  No charge note.

## 2020-08-25 NOTE — Progress Notes (Signed)
Assisted tele visit to patient with family member.  Tomislav Micale M, RN  

## 2020-08-26 LAB — GLUCOSE, CAPILLARY
Glucose-Capillary: 131 mg/dL — ABNORMAL HIGH (ref 70–99)
Glucose-Capillary: 133 mg/dL — ABNORMAL HIGH (ref 70–99)
Glucose-Capillary: 141 mg/dL — ABNORMAL HIGH (ref 70–99)
Glucose-Capillary: 161 mg/dL — ABNORMAL HIGH (ref 70–99)
Glucose-Capillary: 168 mg/dL — ABNORMAL HIGH (ref 70–99)
Glucose-Capillary: 185 mg/dL — ABNORMAL HIGH (ref 70–99)

## 2020-08-26 LAB — CBC
HCT: 24.1 % — ABNORMAL LOW (ref 39.0–52.0)
Hemoglobin: 7.3 g/dL — ABNORMAL LOW (ref 13.0–17.0)
MCH: 32 pg (ref 26.0–34.0)
MCHC: 30.3 g/dL (ref 30.0–36.0)
MCV: 105.7 fL — ABNORMAL HIGH (ref 80.0–100.0)
Platelets: 215 10*3/uL (ref 150–400)
RBC: 2.28 MIL/uL — ABNORMAL LOW (ref 4.22–5.81)
RDW: 18.1 % — ABNORMAL HIGH (ref 11.5–15.5)
WBC: 11.9 10*3/uL — ABNORMAL HIGH (ref 4.0–10.5)
nRBC: 1.4 % — ABNORMAL HIGH (ref 0.0–0.2)

## 2020-08-26 MED ORDER — POLYETHYLENE GLYCOL 3350 17 G PO PACK
17.0000 g | PACK | Freq: Two times a day (BID) | ORAL | Status: DC
  Administered 2020-08-26 – 2020-09-24 (×34): 17 g
  Filled 2020-08-26 (×34): qty 1

## 2020-08-26 NOTE — Progress Notes (Signed)
7 Days Post-Op   Subjective/Chief Complaint: Pt with no acute issues overnight No BMs   Objective: Vital signs in last 24 hours: Temp:  [98.3 F (36.8 C)-100.2 F (37.9 C)] 98.3 F (36.8 C) (04/10 0400) Pulse Rate:  [79-98] 84 (04/10 0600) Resp:  [13-19] 18 (04/10 0600) BP: (99-141)/(57-83) 120/76 (04/10 0600) SpO2:  [90 %-95 %] 91 % (04/10 0600) Arterial Line BP: (116-167)/(53-75) 131/57 (04/10 0600) FiO2 (%):  [40 %] 40 % (04/10 0300) Weight:  [103.9 kg] 103.9 kg (04/10 0400) Last BM Date: 08/24/20  Intake/Output from previous day: 04/09 0701 - 04/10 0700 In: 2607.6 [I.V.:467.6; NG/GT:2040; IV Piggyback:100.1] Out: 2215 [Urine:1980; Chest Tube:235] Intake/Output this shift: No intake/output data recorded.  Physical Exam: Gen: comfortable, no distress Neuro: not f/c for me this AM HEENT: PERRL Neck:c-collar CV: RRR Pulm: unlabored breathing Abd: soft, NT GU: clear yellow urine, foley Extr: wwp, no edema  Lab Results:  Recent Labs    08/25/20 0520 08/26/20 0330  WBC 13.4* 11.9*  HGB 7.3* 7.3*  HCT 24.1* 24.1*  PLT 203 215   BMET Recent Labs    08/24/20 0535 08/25/20 0520  NA 146* 148*  K 4.1 4.2  CL 105 104  CO2 38* 40*  GLUCOSE 146* 153*  BUN 35* 33*  CREATININE 0.56* 0.77  CALCIUM 7.8* 7.9*   PT/INR No results for input(s): LABPROT, INR in the last 72 hours. ABG No results for input(s): PHART, HCO3 in the last 72 hours.  Invalid input(s): PCO2, PO2  Studies/Results: DG Thoracic Spine 1 View  Result Date: 08/25/2020 CLINICAL DATA:  Follow-up multiple thoracic vertebral fractures. EXAM: OPERATIVE THORACIC SPINE 1 VIEW(S) COMPARISON:  Chest, abdomen and pelvis CT dated 08/17/2000. FINDINGS: A portable lateral radiograph of the thoracic spine was requested and insisted upon by Dr. Bedelia Person and she refused to discuss the request with a radiologist. Two portable lateral views were obtained per her instructions. These are nondiagnostic due to the  portable technique and thickness of the overlying posterior tissues. The vast majority of the thoracic spine cannot be visualized and the previously described multiple thoracic spine fractures are not visible. IMPRESSION: Nondiagnostic examination. The previously demonstrated multiple thoracic vertebral fractures cannot be visualized. If follow-up of the fractures is desired, this would require a thoracic spine or chest CT. Electronically Signed   By: Beckie Salts M.D.   On: 08/25/2020 17:22    Anti-infectives: Anti-infectives (From admission, onward)   Start     Dose/Rate Route Frequency Ordered Stop   08/19/20 0845  cefTRIAXone (ROCEPHIN) 2 g in sodium chloride 0.9 % 100 mL IVPB  Status:  Discontinued        2 g 200 mL/hr over 30 Minutes Intravenous Every 24 hours 08/19/20 0753 08/25/20 1304   08/17/20 1515  ceFAZolin (ANCEF) IVPB 2g/100 mL premix        2 g 200 mL/hr over 30 Minutes Intravenous  Once 08/17/20 1500 08/17/20 1530      Assessment/Plan: Fall from height  R PTX- CT in place on water seal,235cc out overnight so continue today Multiple B rib fxs- these are at the junction of the thoracic vertebral bodies, pain control, IS 3 column T8 vertebral body fx-perDr. Jones,non-op now, plan mobilize in brace once off vent. Needs CTO brace for HOB>30 degrees T7 neural arch fx- per nsgy R TVP T4-8 - pain control, per nsgy R C7 TVP fx through foramen- C collar per Dr. Yetta Barre, CTAnegatvie Large scalp laceration/right eyebrow laceration- repair by Dr.  Lovick4/05/2020, sutures need to be removed4/11/22 Right neck abrasion/skin tear- bacitracin BID Facial ecchymosis- CT maxillofacialdemonstrates no facial bone fractures. Some anterior subluxation of the right mandibular condyle HTN- home lopressor 25mg  daily, restartedat half dose ad HR lower at times CAD/H/O MI/DES- on ASA at home. Hold for now. Monitor, on tele Acute hypercarbic ventilator dependent respiratory  failure/COPD-40% and PEEP 5.Wean as tolerated to maintain sats >88%.Solumedrol x5d per CCM recs, restartedhome inhalers 4/8 Hypotension- resolved, off pressors Depression/anxiety- restart home bupropion whentaking PO, restarted home buspar BPH- urecholine, failed voiding trial, replacedfoley 4/8,  restartedhome terazosin at half dose 4/6, increase to home dose today ABL anemia- hgbstable FEN-TF, escalate bowel regimen VTE-LMWH Dispo- ICU,palliative care engaged for [facility] regarding trach/PEG    Critical Care Total Time:30 minutes  LOS: 9 days    [facility] 08/26/2020

## 2020-08-26 NOTE — Progress Notes (Signed)
Subjective: NAEs o/n  Objective: Vital signs in last 24 hours: Temp:  [98.3 F (36.8 C)-99.9 F (37.7 C)] 98.5 F (36.9 C) (04/10 0800) Pulse Rate:  [79-93] 83 (04/10 1000) Resp:  [13-19] 18 (04/10 1000) BP: (99-141)/(57-83) 115/71 (04/10 1000) SpO2:  [90 %-95 %] 92 % (04/10 1000) Arterial Line BP: (116-167)/(53-75) 135/65 (04/10 1000) FiO2 (%):  [40 %] 40 % (04/10 0809) Weight:  [103.9 kg] 103.9 kg (04/10 0400)  Intake/Output from previous day: 04/09 0701 - 04/10 0700 In: 2607.6 [I.V.:467.6; NG/GT:2040; IV Piggyback:100.1] Out: 2215 [Urine:1980; Chest Tube:235] Intake/Output this shift: Total I/O In: -  Out: 515 [Urine:375; Chest Tube:140]  Intubated MAEs  Lab Results: Recent Labs    08/25/20 0520 08/26/20 0330  WBC 13.4* 11.9*  HGB 7.3* 7.3*  HCT 24.1* 24.1*  PLT 203 215   BMET Recent Labs    08/24/20 0535 08/25/20 0520  NA 146* 148*  K 4.1 4.2  CL 105 104  CO2 38* 40*  GLUCOSE 146* 153*  BUN 35* 33*  CREATININE 0.56* 0.77  CALCIUM 7.8* 7.9*    Studies/Results: DG Thoracic Spine 1 View  Result Date: 08/25/2020 CLINICAL DATA:  Follow-up multiple thoracic vertebral fractures. EXAM: OPERATIVE THORACIC SPINE 1 VIEW(S) COMPARISON:  Chest, abdomen and pelvis CT dated 08/17/2000. FINDINGS: A portable lateral radiograph of the thoracic spine was requested and insisted upon by Dr. Bedelia Weeks and she refused to discuss the request with a radiologist. Two portable lateral views were obtained per her instructions. These are nondiagnostic due to the portable technique and thickness of the overlying posterior tissues. The vast majority of the thoracic spine cannot be visualized and the previously described multiple thoracic spine fractures are not visible. IMPRESSION: Nondiagnostic examination. The previously demonstrated multiple thoracic vertebral fractures cannot be visualized. If follow-up of the fractures is desired, this would require a thoracic spine or chest CT.  Electronically Signed   By: Beckie Salts M.D.   On: 08/25/2020 17:22    Assessment/Plan: Thoracic fracture - unfortunately, portable upright lateral x-ray unfortunately could not be interpreted.  A CT would not be helpful in the supine position - given the [facility] does not appear to be disrupted, the fracture has a good chance of healing without surgery.  It would be reasonable to work toward extubating him, try to mobilize him in a brace, and then obtain an upright thoracic x-ray in brace   Charles Weeks 08/26/2020, 10:56 AM

## 2020-08-27 LAB — BASIC METABOLIC PANEL
Anion gap: 4 — ABNORMAL LOW (ref 5–15)
BUN: 33 mg/dL — ABNORMAL HIGH (ref 6–20)
CO2: 40 mmol/L — ABNORMAL HIGH (ref 22–32)
Calcium: 8.1 mg/dL — ABNORMAL LOW (ref 8.9–10.3)
Chloride: 104 mmol/L (ref 98–111)
Creatinine, Ser: 0.72 mg/dL (ref 0.61–1.24)
GFR, Estimated: >60 mL/min (ref >60)
Glucose, Bld: 138 mg/dL — ABNORMAL HIGH (ref 70–99)
Potassium: 3.8 mmol/L (ref 3.5–5.1)
Sodium: 148 mmol/L — ABNORMAL HIGH (ref 135–145)

## 2020-08-27 LAB — GLUCOSE, CAPILLARY
Glucose-Capillary: 154 mg/dL — ABNORMAL HIGH (ref 70–99)
Glucose-Capillary: 180 mg/dL — ABNORMAL HIGH (ref 70–99)
Glucose-Capillary: 169 mg/dL — ABNORMAL HIGH (ref 70–99)
Glucose-Capillary: 134 mg/dL — ABNORMAL HIGH (ref 70–99)
Glucose-Capillary: 154 mg/dL — ABNORMAL HIGH (ref 70–99)

## 2020-08-27 LAB — TRIGLYCERIDES: Triglycerides: 155 mg/dL — ABNORMAL HIGH (ref <150)

## 2020-08-27 MED ORDER — ACETAMINOPHEN 325 MG PO TABS: 650.0000 mg | ORAL_TABLET | Freq: Four times a day (QID) | ORAL | Status: DC | PRN

## 2020-08-27 MED ORDER — CLONAZEPAM 1 MG PO TABS
2.0000 mg | ORAL_TABLET | Freq: Two times a day (BID) | ORAL | Status: DC
  Administered 2020-08-27 – 2020-09-06 (×20): 2 mg
  Filled 2020-08-27 (×20): qty 2

## 2020-08-27 MED ORDER — ACETAMINOPHEN 160 MG/5ML PO SOLN
650.0000 mg | Freq: Four times a day (QID) | ORAL | Status: DC | PRN
  Administered 2020-09-07 – 2020-09-10 (×5): 650 mg via ORAL
  Filled 2020-08-27 (×5): qty 20.3

## 2020-08-27 MED ORDER — ACETAMINOPHEN 650 MG RE SUPP: 650.0000 mg | Freq: Four times a day (QID) | RECTAL | Status: DC | PRN

## 2020-08-27 MED ORDER — QUETIAPINE FUMARATE 200 MG PO TABS
200.0000 mg | ORAL_TABLET | Freq: Two times a day (BID) | ORAL | Status: DC
  Administered 2020-08-27 – 2020-09-06 (×21): 200 mg
  Filled 2020-08-27 (×21): qty 1

## 2020-08-27 NOTE — Progress Notes (Signed)
Pt placed on PSV 5/5 per wean protocol. Pt is tolerating well at this time. RN at bedside. RT to continue to monitor. 

## 2020-08-27 NOTE — Progress Notes (Signed)
Flexiseal with no output for past 72h, removed from patient and cleaned. Placed in patient bag in patient's bathroom.  Aris Lot, RN

## 2020-08-27 NOTE — Progress Notes (Signed)
RT called by RN to place pt back on full vent support due to B/P being too high. Pt is tolerating well at this time.

## 2020-08-27 NOTE — Progress Notes (Signed)
Patient ID: Charles Weeks, male   DOB: 01/15/1966, 55 y.o.   MRN: [id number] Follow up - Trauma Critical Care  Patient Details:    Charles Weeks is an 55 y.o. male.  Lines/tubes : Airway 7.5 mm (Active)  Secured at (cm) 26 cm 08/27/20 0814  Measured From Lips 08/27/20 0814  Secured Location Left 08/27/20 0814  Secured By [employer] 08/27/20 0814  Tube Holder Repositioned Yes 08/27/20 0814  Prone position No 08/26/20 1952  Cuff Pressure (cm H2O) Green OR 18-26 [facility] 08/27/20 0814  Site Condition Dry 08/27/20 0814     CVC Triple Lumen 08/18/20 Right Subclavian (Active)  Indication for Insertion or Continuance of Line Prolonged intravenous therapies 08/27/20 0753  Site Assessment Clean;Dry;Intact 08/27/20 0753  Proximal Lumen Status Flushed 08/27/20 0753  Medial Lumen Status Infusing 08/27/20 0753  Distal Lumen Status Infusing 08/27/20 0753  Dressing Type Transparent;Occlusive 08/27/20 0753  Dressing Status Clean;Dry;Intact 08/27/20 0753  Antimicrobial disc in place? Yes 08/27/20 0753  Line Care Connections checked and tightened;Medial tubing changed;Distal tubing changed 08/27/20 0753  Dressing Intervention Dressing changed 08/26/20 0300  Dressing Change Due 09/02/20 08/27/20 0753     Arterial Line 08/21/20 Right Radial (Active)  Site Assessment Clean;Dry;Intact 08/27/20 0753  Line Status Pulsatile blood flow 08/27/20 0753  Art Line Waveform Appropriate;Square wave test performed 08/27/20 0753  Art Line Interventions Zeroed and calibrated;Connections checked and tightened 08/27/20 0753  Color/Movement/Sensation Capillary refill less than 3 sec 08/27/20 0753  Dressing Type Transparent;Occlusive 08/27/20 0753  Dressing Status Clean;Dry;Intact;Antimicrobial disc in place 08/27/20 0753  Dressing Change Due 08/28/20 08/27/20 0753     Chest Tube Lateral;Right (Active)  Status To water seal 08/26/20 2000  Chest Tube Air Leak None 08/26/20 2000  Patency Intervention  Tip/tilt 08/26/20 2000  Drainage Description Serosanguineous 08/26/20 2000  Dressing Status Clean;Dry;Intact 08/26/20 2000  Dressing Intervention Dressing reinforced 08/26/20 2000  Site Assessment Other (Comment) 08/24/20 2000  Surrounding Skin Unable to view 08/26/20 2000  Output (mL) 56 mL 08/27/20 0400     NG/OG Tube Orogastric Center mouth Xray (Active)  External Length of Tube (cm) - (if applicable) 66.5 cm 08/20/20 0800  Site Assessment Clean;Dry;Intact 08/26/20 2000  Ongoing Placement Verification No change in respiratory status 08/26/20 2000  Status Infusing tube feed 08/26/20 2000  Drainage Appearance Brown 08/20/20 0800  Intake (mL) 60 mL 08/24/20 2000  Output (mL) 100 mL 08/20/20 1430     Rectal Tube/Pouch (Active)  Output (mL) 0 mL 08/27/20 0400     Urethral Catheter [name] RN Straight-tip 14 Fr. (Active)  Indication for Insertion or Continuance of Catheter Acute urinary retention (I&O Cath for 24 hrs prior to catheter insertion- Inpatient Only) 08/27/20 0800  Site Assessment Clean;Intact;Dry 08/27/20 0800  Catheter Maintenance Bag below level of bladder;Insertion date on drainage bag;Catheter secured;No dependent loops;Drainage bag/tubing not touching floor;Seal intact 08/27/20 0800  Collection Container Standard drainage bag 08/27/20 0800  Securement Method Securing device (Describe) 08/27/20 0800  Urinary Catheter Interventions (if applicable) Unclamped 08/27/20 0800  Output (mL) 100 mL 08/27/20 0330    Microbiology/Sepsis markers: Results for orders placed or performed during the hospital encounter of 08/17/20  Resp Panel by RT-PCR (Flu A&B, Covid) Nasopharyngeal Swab     Status: None   Collection Time: 08/17/20  3:40 PM   Specimen: Nasopharyngeal Swab; Nasopharyngeal(NP) swabs in vial transport medium  Result Value Ref Range Status   SARS Coronavirus 2 by RT PCR NEGATIVE NEGATIVE Final    Comment: (NOTE) SARS-CoV-2  target nucleic acids are NOT  DETECTED.  The SARS-CoV-2 RNA is generally detectable in upper respiratory specimens during the acute phase of infection. The lowest concentration of SARS-CoV-2 viral copies this assay can detect is 138 copies/mL. A negative result does not preclude SARS-Cov-2 infection and should not be used as the sole basis for treatment or other patient management decisions. A negative result may occur with  improper specimen collection/handling, submission of specimen other than nasopharyngeal swab, presence of viral mutation(s) within the areas targeted by this assay, and inadequate number of viral copies(<138 copies/mL). A negative result must be combined with clinical observations, patient history, and epidemiological information. The expected result is Negative.  Fact Sheet for Patients:  BloggerCourse.com  Fact Sheet for Healthcare Providers:  SeriousBroker.it  This test is no t yet approved or cleared by the [place] FDA and  has been authorized for detection and/or diagnosis of SARS-CoV-2 by FDA under an Emergency Use Authorization (EUA). This EUA will remain  in effect (meaning this test can be used) for the duration of the COVID-19 declaration under Section 564(b)(1) of the Act, 21 U.S.C.section 360bbb-3(b)(1), unless the authorization is terminated  or revoked sooner.       Influenza A by PCR NEGATIVE NEGATIVE Final   Influenza B by PCR NEGATIVE NEGATIVE Final    Comment: (NOTE) The Xpert Xpress SARS-CoV-2/FLU/RSV plus assay is intended as an aid in the diagnosis of influenza from Nasopharyngeal swab specimens and should not be used as a sole basis for treatment. Nasal washings and aspirates are unacceptable for Xpert Xpress SARS-CoV-2/FLU/RSV testing.  Fact Sheet for Patients: BloggerCourse.com  Fact Sheet for Healthcare Providers: SeriousBroker.it  This test is not yet  approved or cleared by the [place] FDA and has been authorized for detection and/or diagnosis of SARS-CoV-2 by FDA under an Emergency Use Authorization (EUA). This EUA will remain in effect (meaning this test can be used) for the duration of the COVID-19 declaration under Section 564(b)(1) of the Act, 21 U.S.C. section 360bbb-3(b)(1), unless the authorization is terminated or revoked.  Performed at [facility], [address] [place], [place] [address]   Surgical PCR screen     Status: None   Collection Time: 08/19/20  7:38 AM   Specimen: Nasal Mucosa; Nasal Swab  Result Value Ref Range Status   MRSA, PCR NEGATIVE NEGATIVE Final   Staphylococcus aureus NEGATIVE NEGATIVE Final    Comment: (NOTE) The Xpert SA Assay (FDA approved for NASAL specimens in patients 72 years of age and older), is one component of a comprehensive surveillance program. It is not intended to diagnose infection nor to guide or monitor treatment. Performed at [facility], [address] [place], [place] [address]     Anti-infectives:  Anti-infectives (From admission, onward)   Start     Dose/Rate Route Frequency Ordered Stop   08/19/20 0845  cefTRIAXone (ROCEPHIN) 2 g in sodium chloride 0.9 % 100 mL IVPB  Status:  Discontinued        2 g 200 mL/hr over 30 Minutes Intravenous Every 24 hours 08/19/20 0753 08/25/20 1304   08/17/20 1515  ceFAZolin (ANCEF) IVPB 2g/100 mL premix        2 g 200 mL/hr over 30 Minutes Intravenous  Once 08/17/20 1500 08/17/20 1530      Best Practice/Protocols:  VTE Prophylaxis: Lovenox (prophylaxtic dose) Continous Sedation  Consults: Treatment Team:  Tia Alert, MD    Studies:    Events:  Subjective:    Overnight Issues:   Objective:  Vital signs for last 24 hours: Temp:  [98.3 F (36.8 C)-99.4 F (37.4 C)] 98.4 F (36.9 C) (04/11 0800) Pulse Rate:  [73-105] 73 (04/11 0922) Resp:  [8-18] 8 (04/11 0922) BP: (109-162)/(67-97) 162/97  (04/11 0922) SpO2:  [89 %-95 %] 91 % (04/11 0922) Arterial Line BP: (110-146)/(54-65) 123/63 (04/11 0800) FiO2 (%):  [40 %-45 %] 45 % (04/11 0922)  Hemodynamic parameters for last 24 hours:    Intake/Output from previous day: 04/10 0701 - 04/11 0700 In: 2937.8 [I.V.:537.8; NG/GT:2400] Out: 936 [Urine:700; Chest Tube:236]  Intake/Output this shift: Total I/O In: 80.3 [I.V.:20.3; NG/GT:60] Out: -   Vent settings for last 24 hours: Vent Mode: PRVC FiO2 (%):  [40 %-45 %] 45 % Set Rate:  [18 bmp] 18 bmp Vt Set:  [480 mL] 480 mL PEEP:  [5 cmH20] 5 cmH20 Pressure Support:  [5 cmH20] 5 cmH20 Plateau Pressure:  [12 cmH20-28 cmH20] 19 cmH20  Physical Exam:  General: on vent Neuro: F/C HEENT/Neck: ETT and collar Resp: few rhonchi CVS: RRR GI: soft, NT Extremities: edema 1+  Results for orders placed or performed during the hospital encounter of 08/17/20 (from the past 24 hour(s))  Glucose, capillary     Status: Abnormal   Collection Time: 08/26/20 11:57 AM  Result Value Ref Range   Glucose-Capillary 133 (H) 70 - 99 mg/dL  Glucose, capillary     Status: Abnormal   Collection Time: 08/26/20  4:04 PM  Result Value Ref Range   Glucose-Capillary 131 (H) 70 - 99 mg/dL  Glucose, capillary     Status: Abnormal   Collection Time: 08/26/20  7:55 PM  Result Value Ref Range   Glucose-Capillary 168 (H) 70 - 99 mg/dL  Glucose, capillary     Status: Abnormal   Collection Time: 08/26/20 11:35 PM  Result Value Ref Range   Glucose-Capillary 185 (H) 70 - 99 mg/dL  Triglycerides     Status: Abnormal   Collection Time: 08/27/20  3:31 AM  Result Value Ref Range   Triglycerides 155 (H) <150 mg/dL  Basic metabolic panel     Status: Abnormal   Collection Time: 08/27/20  3:31 AM  Result Value Ref Range   Sodium 148 (H) 135 - 145 mmol/L   Potassium 3.8 3.5 - 5.1 mmol/L   Chloride 104 98 - 111 mmol/L   CO2 40 (H) 22 - 32 mmol/L   Glucose, Bld 138 (H) 70 - 99 mg/dL   BUN 33 (H) 6 - 20 mg/dL    Creatinine, Ser 2.99 0.61 - 1.24 mg/dL   Calcium 8.1 (L) 8.9 - 10.3 mg/dL   GFR, Estimated >37 >16 mL/min   Anion gap 4 (L) 5 - 15  Glucose, capillary     Status: Abnormal   Collection Time: 08/27/20  3:43 AM  Result Value Ref Range   Glucose-Capillary 154 (H) 70 - 99 mg/dL  Glucose, capillary     Status: Abnormal   Collection Time: 08/27/20  7:54 AM  Result Value Ref Range   Glucose-Capillary 180 (H) 70 - 99 mg/dL    Assessment & Plan: Present on Admission: . Thoracic spine fracture (HCC)    LOS: 10 days   Additional comments:I reviewed the patient's new clinical lab test results. . Fall from height  R PTX- CT in place on water seal,235cc out overnight so continue today Multiple B rib fxs- these are at the junction of the thoracic vertebral bodies, pain  control, IS 3 column T8 vertebral body fx-perDr. Jones,non-op now, plan mobilize in brace once off vent. Needs CTO brace for HOB>30 degrees T7 neural arch fx- per nsgy R TVP T4-8 - pain control, per nsgy R C7 TVP fx through foramen- C collar per Dr. Yetta Barre, CTAnegatvie Large scalp laceration/right eyebrow laceration- repair by Dr. Lovick4/05/2020, sutures to be removed4/11/22 (today) Right neck abrasion/skin tear- bacitracin BID Facial ecchymosis- CT maxillofacialdemonstrates no facial bone fractures. Some anterior subluxation of the right mandibular condyle HTN- home lopressor 25mg  daily, restartedat half dose ad HR lower at times CAD/H/O MI/DES- on ASA at home. Hold for now. Monitor, on tele Acute hypercarbic ventilator dependent respiratory failure/COPD-40% and PEEP 5.Wean as tolerated to maintain sats >88%.Solumedrol x5d per CCM recs, restartedhome inhalers 4/8 Hypotension- resolved, off pressors Depression/anxiety- restart home bupropion whentaking PO, restarted home buspar BPH- urecholine, failed voiding trial, replacedfoley 4/8,  restartedhome terazosin at half dose 4/6, increased to  home dose ABL anemia- hgbstable FEN-TF, escalated bowel regimen VTE-LMWH Dispo- ICU,palliative care engaged for [facility] regarding trach/PEG. Increase klon/sero to aid weaning. His sister is having surgery today. Critical Care Total Time*: 34 Minutes  [facility], MD, MPH, FACS Trauma & General Surgery Use [email or web address] to contact on call provider  08/27/2020  *Care during the described time interval was provided by me. I have reviewed this patient's available data, including medical history, events of note, physical examination and test results as part of my evaluation.

## 2020-08-27 NOTE — Progress Notes (Signed)
Nutrition Follow-up  DOCUMENTATION CODES:   Not applicable  INTERVENTION:   Tube Feeding via OG:  Continue Pivot 1.5 at 60 ml/hr Provides 2160 kcals, 135 g of protein, 1092 mL of free water   NUTRITION DIAGNOSIS:   Increased nutrient needs related to  (trauma) as evidenced by estimated needs.  Being addressed via TF   GOAL:   Patient will meet greater than or equal to 90% of their needs  Progressing  MONITOR:   Vent status,TF tolerance  REASON FOR ASSESSMENT:   Ventilator,Consult Enteral/tube feeding initiation and management  ASSESSMENT:   Pt with PMH of COPD followed by Dr Sherene Sires, HTN, CAD s/p stent who was admitted after fall over 1/2 wall from the second story landing with multiple rib fxs, R pneumothorax, large scalp laceration, and T8 vertebral body fxs, T7, T4-8, C7 fxs.   Tolerating Pivot 1.5 at 60 ml/hr via OG tube; free water 200 mL q 4 hours  Thoracic fracture not repaired. Plan for mobilization in brace once off vent. HOB >30 degrees with CTO brace  Rectal tube removed today as pt with no output in 72 hours. Noted bowel regimen increased  CT with 235 mL out overnight  Hypernatremia being addressed with free water flushes; may need to increase further if sodium continues to trend up  Labs: sodium 148 (H) Meds: ss novolog, MVI with Minerals, miralax, colace, senokot     Diet Order:   Diet Order            Diet NPO time specified  Diet effective midnight                 EDUCATION NEEDS:   No education needs have been identified at this time  Skin:  Skin Assessment: Reviewed RN Assessment (head laceration)  Last BM:  unknown  Height:   Ht Readings from Last 1 Encounters:  08/17/20 5\' 8"  (1.727 m)    Weight:   Wt Readings from Last 1 Encounters:  08/26/20 103.9 kg    BMI:  Body mass index is 34.83 kg/m.  Estimated Nutritional Needs:   Kcal:  2000-2200  Protein:  120-135 grams  Fluid:  >2 L/day   10/26/20 MS, RDN,  LDN, CNSC Registered Dietitian III Clinical Nutrition RD Pager and On-Call Pager Number Located in [phone]

## 2020-08-28 LAB — BASIC METABOLIC PANEL
Anion gap: 4 — ABNORMAL LOW (ref 5–15)
BUN: 31 mg/dL — ABNORMAL HIGH (ref 6–20)
CO2: 39 mmol/L — ABNORMAL HIGH (ref 22–32)
Calcium: 8.1 mg/dL — ABNORMAL LOW (ref 8.9–10.3)
Chloride: 103 mmol/L (ref 98–111)
Creatinine, Ser: 0.68 mg/dL (ref 0.61–1.24)
GFR, Estimated: >60 mL/min (ref >60)
Glucose, Bld: 155 mg/dL — ABNORMAL HIGH (ref 70–99)
Potassium: 4 mmol/L (ref 3.5–5.1)
Sodium: 146 mmol/L — ABNORMAL HIGH (ref 135–145)

## 2020-08-28 LAB — GLUCOSE, CAPILLARY
Glucose-Capillary: 124 mg/dL — ABNORMAL HIGH (ref 70–99)
Glucose-Capillary: 133 mg/dL — ABNORMAL HIGH (ref 70–99)
Glucose-Capillary: 134 mg/dL — ABNORMAL HIGH (ref 70–99)
Glucose-Capillary: 141 mg/dL — ABNORMAL HIGH (ref 70–99)
Glucose-Capillary: 141 mg/dL — ABNORMAL HIGH (ref 70–99)
Glucose-Capillary: 144 mg/dL — ABNORMAL HIGH (ref 70–99)
Glucose-Capillary: 166 mg/dL — ABNORMAL HIGH (ref 70–99)

## 2020-08-28 LAB — CBC
HCT: 26 % — ABNORMAL LOW (ref 39.0–52.0)
Hemoglobin: 7.7 g/dL — ABNORMAL LOW (ref 13.0–17.0)
MCH: 32.2 pg (ref 26.0–34.0)
MCHC: 29.6 g/dL — ABNORMAL LOW (ref 30.0–36.0)
MCV: 108.8 fL — ABNORMAL HIGH (ref 80.0–100.0)
Platelets: 257 10*3/uL (ref 150–400)
RBC: 2.39 MIL/uL — ABNORMAL LOW (ref 4.22–5.81)
RDW: 18.1 % — ABNORMAL HIGH (ref 11.5–15.5)
WBC: 11.7 10*3/uL — ABNORMAL HIGH (ref 4.0–10.5)
nRBC: 0.4 % — ABNORMAL HIGH (ref 0.0–0.2)

## 2020-08-28 NOTE — Progress Notes (Signed)
Trauma/Critical Care Follow Up Note  Subjective:    Overnight Issues:   Objective:  Vital signs for last 24 hours: Temp:  [98.1 F (36.7 C)-99.1 F (37.3 C)] 99.1 F (37.3 C) (04/12 1600) Pulse Rate:  [82-107] 93 (04/12 1450) Resp:  [12-19] 13 (04/12 1450) BP: (103-151)/(59-93) 151/93 (04/12 0909) SpO2:  [90 %-94 %] 94 % (04/12 1450) Arterial Line BP: (103-138)/(52-68) 134/60 (04/12 0800) FiO2 (%):  [45 %-50 %] 50 % (04/12 1450) Weight:  [105 kg] 105 kg (04/12 0403)  Hemodynamic parameters for last 24 hours:    Intake/Output from previous day: 04/11 0701 - 04/12 0700 In: 2555.9 [I.V.:475.9; NG/GT:2080] Out: 1582 [Urine:1340; Chest Tube:242]  Intake/Output this shift: Total I/O In: 283.9 [I.V.:43.9; NG/GT:240] Out: 250 [Urine:250]  Vent settings for last 24 hours: Vent Mode: PRVC FiO2 (%):  [45 %-50 %] 50 % Set Rate:  [18 bmp] 18 bmp Vt Set:  [480 mL] 480 mL PEEP:  [5 cmH20] 5 cmH20 Plateau Pressure:  [15 cmH20-28 cmH20] 25 cmH20  Physical Exam:  Gen: comfortable, no distress Neuro: not f/c HEENT: PERRL Neck: c-collar CV: RRR Pulm: unlabored breathing Abd: soft, NT GU: clear yellow urine Extr: wwp, no edema   Results for orders placed or performed during the hospital encounter of 08/17/20 (from the past 24 hour(s))  Glucose, capillary     Status: Abnormal   Collection Time: 08/27/20  7:52 PM  Result Value Ref Range   Glucose-Capillary 154 (H) 70 - 99 mg/dL  Glucose, capillary     Status: Abnormal   Collection Time: 08/28/20 12:10 AM  Result Value Ref Range   Glucose-Capillary 124 (H) 70 - 99 mg/dL  Glucose, capillary     Status: Abnormal   Collection Time: 08/28/20  4:01 AM  Result Value Ref Range   Glucose-Capillary 141 (H) 70 - 99 mg/dL  CBC     Status: Abnormal   Collection Time: 08/28/20  4:22 AM  Result Value Ref Range   WBC 11.7 (H) 4.0 - 10.5 K/uL   RBC 2.39 (L) 4.22 - 5.81 MIL/uL   Hemoglobin 7.7 (L) 13.0 - 17.0 g/dL   HCT 96.7 (L)  59.1 - 52.0 %   MCV 108.8 (H) 80.0 - 100.0 fL   MCH 32.2 26.0 - 34.0 pg   MCHC 29.6 (L) 30.0 - 36.0 g/dL   RDW 63.8 (H) 46.6 - 59.9 %   Platelets 257 150 - 400 K/uL   nRBC 0.4 (H) 0.0 - 0.2 %  Basic metabolic panel     Status: Abnormal   Collection Time: 08/28/20  4:22 AM  Result Value Ref Range   Sodium 146 (H) 135 - 145 mmol/L   Potassium 4.0 3.5 - 5.1 mmol/L   Chloride 103 98 - 111 mmol/L   CO2 39 (H) 22 - 32 mmol/L   Glucose, Bld 155 (H) 70 - 99 mg/dL   BUN 31 (H) 6 - 20 mg/dL   Creatinine, Ser 3.57 0.61 - 1.24 mg/dL   Calcium 8.1 (L) 8.9 - 10.3 mg/dL   GFR, Estimated >01 >77 mL/min   Anion gap 4 (L) 5 - 15  Glucose, capillary     Status: Abnormal   Collection Time: 08/28/20  7:42 AM  Result Value Ref Range   Glucose-Capillary 141 (H) 70 - 99 mg/dL  Glucose, capillary     Status: Abnormal   Collection Time: 08/28/20 11:27 AM  Result Value Ref Range   Glucose-Capillary 134 (H) 70 - 99 mg/dL  Glucose, capillary     Status: Abnormal   Collection Time: 08/28/20  4:14 PM  Result Value Ref Range   Glucose-Capillary 144 (H) 70 - 99 mg/dL    Assessment & Plan: The plan of care was discussed with the bedside nurse for the day, who is in agreement with this plan and no additional concerns were raised.   Present on Admission: . Thoracic spine fracture (HCC)    LOS: 11 days   Additional comments:I reviewed the patient's new clinical lab test results.    and I reviewed the patients new imaging test results.    Fall from height  R PTX- CT in place on water seal,242cc out overnight so continue today Multiple B rib fxs- these are at the junction of the thoracic vertebral bodies, pain control, IS 3 column T8 vertebral body fx-perDr. Jones,non-op now, plan mobilize in brace once off vent. Needs CTO brace for HOB>30 degrees T7 neural arch fx- per nsgy R TVP T4-8 - pain control, per nsgy R C7 TVP fx through foramen- C collar per Dr. Yetta Barre, CTAnegatvie Large scalp  laceration/right eyebrow laceration- repair by Dr. Lovick4/05/2020, sutures removed4/11 Right neck abrasion/skin tear- bacitracin BID Facial ecchymosis- CT maxillofacialdemonstrates no facial bone fractures. Some anterior subluxation of the right mandibular condyle HTN- home lopressor 25mg  daily, restartedat half dose and HR lower at times CAD/H/O MI/DES- on ASA at home. Hold for now. Monitor, on tele Acute hypercarbic ventilator dependent respiratory failure/COPD-45% and PEEP 5.Wean as tolerated to maintain sats >88%.Solumedrol x5d per CCM recs, restartedhome inhalers 4/8. Poor weaning. Anticipate need for trach, d/w sister twice last week and unable to reach daughters to obtain consent.  Hypotension- resolved, off pressors Depression/anxiety- restart home bupropion whentaking PO, restarted home buspar BPH- urecholine, failed voiding trial, replacedfoley 4/8, restartedhome terazosin ABL anemia- hgbstable FEN-TF, escalated bowel regimen VTE-LMWH Dispo- ICU,palliative care engaged for [facility] regarding trach/PEG. Will need to get in touch with daughters.  Critical Care Total Time: 35 minutes  [facility], MD Trauma & General Surgery Please use [email or web address] to contact on call provider  08/28/2020  *Care during the described time interval was provided by me. I have reviewed this patient's available data, including medical history, events of note, physical examination and test results as part of my evaluation.

## 2020-08-28 NOTE — Progress Notes (Signed)
Assisted tele visit to patient with daughter.  Redith Drach D Reinaldo Helt, RN  

## 2020-08-29 LAB — CBC
HCT: 26.9 % — ABNORMAL LOW (ref 39.0–52.0)
Hemoglobin: 7.9 g/dL — ABNORMAL LOW (ref 13.0–17.0)
MCH: 32.2 pg (ref 26.0–34.0)
MCHC: 29.4 g/dL — ABNORMAL LOW (ref 30.0–36.0)
MCV: 109.8 fL — ABNORMAL HIGH (ref 80.0–100.0)
Platelets: 288 10*3/uL (ref 150–400)
RBC: 2.45 MIL/uL — ABNORMAL LOW (ref 4.22–5.81)
RDW: 18.2 % — ABNORMAL HIGH (ref 11.5–15.5)
WBC: 12.3 10*3/uL — ABNORMAL HIGH (ref 4.0–10.5)
nRBC: 0.4 % — ABNORMAL HIGH (ref 0.0–0.2)

## 2020-08-29 LAB — BASIC METABOLIC PANEL
Anion gap: 8 (ref 5–15)
BUN: 30 mg/dL — ABNORMAL HIGH (ref 6–20)
CO2: 38 mmol/L — ABNORMAL HIGH (ref 22–32)
Calcium: 8.5 mg/dL — ABNORMAL LOW (ref 8.9–10.3)
Chloride: 101 mmol/L (ref 98–111)
Creatinine, Ser: 0.69 mg/dL (ref 0.61–1.24)
GFR, Estimated: >60 mL/min (ref >60)
Glucose, Bld: 146 mg/dL — ABNORMAL HIGH (ref 70–99)
Potassium: 4 mmol/L (ref 3.5–5.1)
Sodium: 147 mmol/L — ABNORMAL HIGH (ref 135–145)

## 2020-08-29 LAB — GLUCOSE, CAPILLARY
Glucose-Capillary: 145 mg/dL — ABNORMAL HIGH (ref 70–99)
Glucose-Capillary: 152 mg/dL — ABNORMAL HIGH (ref 70–99)
Glucose-Capillary: 154 mg/dL — ABNORMAL HIGH (ref 70–99)
Glucose-Capillary: 158 mg/dL — ABNORMAL HIGH (ref 70–99)
Glucose-Capillary: 169 mg/dL — ABNORMAL HIGH (ref 70–99)
Glucose-Capillary: 89 mg/dL (ref 70–99)

## 2020-08-29 MED ORDER — DIPHENHYDRAMINE HCL 50 MG/ML IJ SOLN
25.0000 mg | Freq: Four times a day (QID) | INTRAMUSCULAR | Status: DC | PRN
  Administered 2020-08-29 – 2020-09-29 (×11): 25 mg via INTRAVENOUS
  Filled 2020-08-29 (×11): qty 1

## 2020-08-29 NOTE — Progress Notes (Signed)
Patient ID: Charles Weeks, male   DOB: 07/10/1965, 55 y.o.   MRN: [id number] Follow up - Trauma Critical Care  Patient Details:    Charles Weeks is an 55 y.o. male.  Lines/tubes : Airway 7.5 mm (Active)  Secured at (cm) 26 cm 08/29/20 0904  Measured From Lips 08/29/20 0904  Secured Location Right 08/29/20 0904  Secured By [employer] 08/29/20 0904  Tube Holder Repositioned Yes 08/29/20 0904  Prone position No 08/29/20 0904  Cuff Pressure (cm H2O) Red OR 40-60 CmH2O 08/29/20 0904  Site Condition Dry 08/29/20 0904     CVC Triple Lumen 08/18/20 Right Subclavian (Active)  Indication for Insertion or Continuance of Line Prolonged intravenous therapies 08/28/20 2000  Site Assessment Clean;Dry;Intact 08/28/20 2000  Proximal Lumen Status Flushed;Saline locked 08/28/20 2000  Medial Lumen Status Infusing 08/28/20 2000  Distal Lumen Status Infusing 08/28/20 2000  Dressing Type Transparent;Occlusive 08/28/20 2000  Dressing Status Clean;Dry;Intact 08/28/20 2000  Antimicrobial disc in place? Yes 08/28/20 2000  Line Care Connections checked and tightened 08/28/20 2000  Dressing Intervention Dressing changed 08/26/20 0300  Dressing Change Due 09/02/20 08/28/20 2000     Arterial Line 08/21/20 Right Radial (Active)  Site Assessment Clean;Dry;Intact 08/28/20 2000  Line Status Pulsatile blood flow 08/28/20 2000  Art Line Waveform Appropriate 08/28/20 2000  Art Line Interventions Zeroed and calibrated;Connections checked and tightened;Line pulled back 08/28/20 2000  Color/Movement/Sensation Capillary refill less than 3 sec 08/28/20 2000  Dressing Type Transparent;Occlusive 08/28/20 2000  Dressing Status Clean;Dry;Intact;Antimicrobial disc in place 08/28/20 2000  Dressing Change Due 08/28/20 08/28/20 2000     Chest Tube Lateral;Right (Active)  Status To water seal 08/28/20 2000  Chest Tube Air Leak None 08/28/20 2000  Patency Intervention Tip/tilt 08/28/20 2000  Drainage  Description Serosanguineous 08/28/20 2000  Dressing Status Clean;Dry;Intact 08/28/20 2000  Dressing Intervention Dressing reinforced 08/27/20 2000  Site Assessment Other (Comment) 08/24/20 2000  Surrounding Skin Unable to view 08/28/20 2000  Output (mL) 120 mL 08/29/20 0526     NG/OG Tube Orogastric Center mouth Xray (Active)  External Length of Tube (cm) - (if applicable) 66.5 cm 08/28/20 0800  Site Assessment Clean;Dry;Intact 08/28/20 2000  Ongoing Placement Verification No change in respiratory status 08/28/20 2000  Status Infusing tube feed 08/28/20 2000  Drainage Appearance Brown 08/20/20 0800  Intake (mL) 60 mL 08/28/20 1700  Output (mL) 100 mL 08/20/20 1430     Urethral Catheter Charles So RN Straight-tip 14 Fr. (Active)  Indication for Insertion or Continuance of Catheter Acute urinary retention (I&O Cath for 24 hrs prior to catheter insertion- Inpatient Only) 08/28/20 2000  Site Assessment Intact;Other (Comment) 08/28/20 2000  Catheter Maintenance Bag below level of bladder;Catheter secured;Drainage bag/tubing not touching floor;Insertion date on drainage bag;No dependent loops;Seal intact 08/28/20 2000  Collection Container Standard drainage bag 08/28/20 2000  Securement Method Securing device (Describe) 08/28/20 2000  Urinary Catheter Interventions (if applicable) Unclamped 08/28/20 2000  Output (mL) 60 mL 08/29/20 0526    Microbiology/Sepsis markers: Results for orders placed or performed during the hospital encounter of 08/17/20  Resp Panel by RT-PCR (Flu A&B, Covid) Nasopharyngeal Swab     Status: None   Collection Time: 08/17/20  3:40 PM   Specimen: Nasopharyngeal Swab; Nasopharyngeal(NP) swabs in vial transport medium  Result Value Ref Range Status   SARS Coronavirus 2 by RT PCR NEGATIVE NEGATIVE Final    Comment: (NOTE) SARS-CoV-2 target nucleic acids are NOT DETECTED.  The SARS-CoV-2 RNA is generally detectable in upper respiratory specimens  during the acute  phase of infection. The lowest concentration of SARS-CoV-2 viral copies this assay can detect is 138 copies/mL. A negative result does not preclude SARS-Cov-2 infection and should not be used as the sole basis for treatment or other patient management decisions. A negative result may occur with  improper specimen collection/handling, submission of specimen other than nasopharyngeal swab, presence of viral mutation(s) within the areas targeted by this assay, and inadequate number of viral copies(<138 copies/mL). A negative result must be combined with clinical observations, patient history, and epidemiological information. The expected result is Negative.  Fact Sheet for Patients:  BloggerCourse.com  Fact Sheet for Healthcare Providers:  SeriousBroker.it  This test is no t yet approved or cleared by the [place] FDA and  has been authorized for detection and/or diagnosis of SARS-CoV-2 by FDA under an Emergency Use Authorization (EUA). This EUA will remain  in effect (meaning this test can be used) for the duration of the COVID-19 declaration under Section 564(b)(1) of the Act, 21 U.S.C.section 360bbb-3(b)(1), unless the authorization is terminated  or revoked sooner.       Influenza A by PCR NEGATIVE NEGATIVE Final   Influenza B by PCR NEGATIVE NEGATIVE Final    Comment: (NOTE) The Xpert Xpress SARS-CoV-2/FLU/RSV plus assay is intended as an aid in the diagnosis of influenza from Nasopharyngeal swab specimens and should not be used as a sole basis for treatment. Nasal washings and aspirates are unacceptable for Xpert Xpress SARS-CoV-2/FLU/RSV testing.  Fact Sheet for Patients: BloggerCourse.com  Fact Sheet for Healthcare Providers: SeriousBroker.it  This test is not yet approved or cleared by the [place] FDA and has been authorized for detection and/or diagnosis of  SARS-CoV-2 by FDA under an Emergency Use Authorization (EUA). This EUA will remain in effect (meaning this test can be used) for the duration of the COVID-19 declaration under Section 564(b)(1) of the Act, 21 U.S.C. section 360bbb-3(b)(1), unless the authorization is terminated or revoked.  Performed at [facility], [address], [place], [place] [address]   Surgical PCR screen     Status: None   Collection Time: 08/19/20  7:38 AM   Specimen: Nasal Mucosa; Nasal Swab  Result Value Ref Range Status   MRSA, PCR NEGATIVE NEGATIVE Final   Staphylococcus aureus NEGATIVE NEGATIVE Final    Comment: (NOTE) The Xpert SA Assay (FDA approved for NASAL specimens in patients 18 years of age and older), is one component of a comprehensive surveillance program. It is not intended to diagnose infection nor to guide or monitor treatment. Performed at [facility], [address], [place], [place] [address]     Anti-infectives:  Anti-infectives (From admission, onward)   Start     Dose/Rate Route Frequency Ordered Stop   08/19/20 0845  cefTRIAXone (ROCEPHIN) 2 g in sodium chloride 0.9 % 100 mL IVPB  Status:  Discontinued        2 g 200 mL/hr over 30 Minutes Intravenous Every 24 hours 08/19/20 0753 08/25/20 1304   08/17/20 1515  ceFAZolin (ANCEF) IVPB 2g/100 mL premix        2 g 200 mL/hr over 30 Minutes Intravenous  Once 08/17/20 1500 08/17/20 1530      Best Practice/Protocols:  VTE Prophylaxis: Lovenox (prophylaxtic dose) Continous Sedation  Consults: Treatment Team:  Tia Alert, MD    Studies:    Events:  Subjective:    Overnight Issues:   Objective:  Vital signs for last 24 hours: Temp:  [  98.1 F (36.7 C)-99.1 F (37.3 C)] 98.7 F (37.1 C) (04/13 0800) Pulse Rate:  [85-108] 96 (04/13 0800) Resp:  [10-20] 19 (04/13 0800) BP: (101-133)/(64-86) 120/71 (04/13 0800) SpO2:  [90 %-96 %] 92 % (04/13 0904) Arterial Line BP: (102-158)/(50-74) 130/61 (04/13  0800) FiO2 (%):  [50 %] 50 % (04/13 0904)  Hemodynamic parameters for last 24 hours:    Intake/Output from previous day: 04/12 0701 - 04/13 0700 In: 3137.1 [I.V.:359.1; NG/GT:2778] Out: 1630 [Urine:1385; Chest Tube:245]  Intake/Output this shift: Total I/O In: 15.3 [I.V.:15.3] Out: -   Vent settings for last 24 hours: Vent Mode: PRVC FiO2 (%):  [50 %] 50 % Set Rate:  [18 bmp] 18 bmp Vt Set:  [480 mL] 480 mL PEEP:  [5 cmH20] 5 cmH20 Plateau Pressure:  [18 cmH20-25 cmH20] 18 cmH20  Physical Exam:  General: on vent Neuro: arouses and F/C HEENT/Neck: ETT and collar Resp: mild wheeze CVS: RRR GI: soft, NT, midline scar Extremities: edema 1+  Results for orders placed or performed during the hospital encounter of 08/17/20 (from the past 24 hour(s))  Glucose, capillary     Status: Abnormal   Collection Time: 08/28/20 11:27 AM  Result Value Ref Range   Glucose-Capillary 134 (H) 70 - 99 mg/dL  Glucose, capillary     Status: Abnormal   Collection Time: 08/28/20  4:14 PM  Result Value Ref Range   Glucose-Capillary 144 (H) 70 - 99 mg/dL  Glucose, capillary     Status: Abnormal   Collection Time: 08/28/20  7:52 PM  Result Value Ref Range   Glucose-Capillary 133 (H) 70 - 99 mg/dL  Glucose, capillary     Status: Abnormal   Collection Time: 08/28/20 11:47 PM  Result Value Ref Range   Glucose-Capillary 166 (H) 70 - 99 mg/dL  Glucose, capillary     Status: Abnormal   Collection Time: 08/29/20  3:53 AM  Result Value Ref Range   Glucose-Capillary 152 (H) 70 - 99 mg/dL  CBC     Status: Abnormal   Collection Time: 08/29/20  4:45 AM  Result Value Ref Range   WBC 12.3 (H) 4.0 - 10.5 K/uL   RBC 2.45 (L) 4.22 - 5.81 MIL/uL   Hemoglobin 7.9 (L) 13.0 - 17.0 g/dL   HCT 34.1 (L) 96.2 - 22.9 %   MCV 109.8 (H) 80.0 - 100.0 fL   MCH 32.2 26.0 - 34.0 pg   MCHC 29.4 (L) 30.0 - 36.0 g/dL   RDW 79.8 (H) 92.1 - 19.4 %   Platelets 288 150 - 400 K/uL   nRBC 0.4 (H) 0.0 - 0.2 %  Basic  metabolic panel     Status: Abnormal   Collection Time: 08/29/20  4:45 AM  Result Value Ref Range   Sodium 147 (H) 135 - 145 mmol/L   Potassium 4.0 3.5 - 5.1 mmol/L   Chloride 101 98 - 111 mmol/L   CO2 38 (H) 22 - 32 mmol/L   Glucose, Bld 146 (H) 70 - 99 mg/dL   BUN 30 (H) 6 - 20 mg/dL   Creatinine, Ser 1.74 0.61 - 1.24 mg/dL   Calcium 8.5 (L) 8.9 - 10.3 mg/dL   GFR, Estimated >08 >14 mL/min   Anion gap 8 5 - 15  Glucose, capillary     Status: Abnormal   Collection Time: 08/29/20  7:58 AM  Result Value Ref Range   Glucose-Capillary 158 (H) 70 - 99 mg/dL    Assessment & Plan: Present on Admission: .  Thoracic spine fracture (HCC)    LOS: 12 days   Additional comments:I reviewed the patient's new clinical lab test results. . Fall from height  R PTX- CT in place on water seal,245cc out overnight Weeks continue today Multiple B rib fxs- these are at the junction of the thoracic vertebral bodies, pain control, IS 3 column T8 vertebral body fx-perDr. Jones,non-op now, plan mobilize in brace once off vent. Needs CTO brace for HOB>30 degrees T7 neural arch fx- per nsgy R TVP T4-8 - pain control, per nsgy R C7 TVP fx through foramen- C collar per Dr. Yetta Barre, CTAnegatvie Large scalp laceration/right eyebrow laceration- repair by Dr. Lovick4/05/2020, sutures removed4/11 Right neck abrasion/skin tear- bacitracin BID Facial ecchymosis- CT maxillofacialdemonstrates no facial bone fractures. Some anterior subluxation of the right mandibular condyle HTN- home lopressor 25mg  daily, restartedat half dose and HR lower at times CAD/H/O MI/DES- on ASA at home. Hold for now. 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 Poor weaning. Anticipate need for trach, d/w sister again and she reports she is discussing with Haruto's  daughters Rash on back - benadryl Hypotension- resolved, off pressors Depression/anxiety- restart home bupropion whentaking PO, restarted home buspar BPH- urecholine, failed voiding trial, replacedfoley 4/8, restartedhome terazosin ABL anemia- hgbstable FEN-TF, bowel regimen VTE-LMWH Dispo- ICU,palliative care engaged for [facility] regarding trach/PEG. See above.  Critical Care Total Time*: 40 Minutes  Violeta Gelinas, MD, MPH, FACS Trauma & General Surgery Use [email or web address] to contact on call provider  08/29/2020  *Care during the described time interval was provided by me. I have reviewed this patient's available data, including medical history, events of note, physical examination and test results as part of my evaluation.

## 2020-08-30 DIAGNOSIS — S22000A Wedge compression fracture of unspecified thoracic vertebra, initial encounter for closed fracture: Secondary | ICD-10-CM | POA: Diagnosis not present

## 2020-08-30 DIAGNOSIS — T1490XA Injury, unspecified, initial encounter: Secondary | ICD-10-CM | POA: Diagnosis not present

## 2020-08-30 DIAGNOSIS — Z515 Encounter for palliative care: Secondary | ICD-10-CM | POA: Diagnosis not present

## 2020-08-30 LAB — GLUCOSE, CAPILLARY
Glucose-Capillary: 136 mg/dL — ABNORMAL HIGH (ref 70–99)
Glucose-Capillary: 153 mg/dL — ABNORMAL HIGH (ref 70–99)
Glucose-Capillary: 172 mg/dL — ABNORMAL HIGH (ref 70–99)
Glucose-Capillary: 155 mg/dL — ABNORMAL HIGH (ref 70–99)
Glucose-Capillary: 192 mg/dL — ABNORMAL HIGH (ref 70–99)
Glucose-Capillary: 139 mg/dL — ABNORMAL HIGH (ref 70–99)

## 2020-08-30 LAB — CBC
HCT: 25.9 % — ABNORMAL LOW (ref 39.0–52.0)
Hemoglobin: 7.6 g/dL — ABNORMAL LOW (ref 13.0–17.0)
MCH: 31.5 pg (ref 26.0–34.0)
MCHC: 29.3 g/dL — ABNORMAL LOW (ref 30.0–36.0)
MCV: 107.5 fL — ABNORMAL HIGH (ref 80.0–100.0)
Platelets: 280 10*3/uL (ref 150–400)
RBC: 2.41 MIL/uL — ABNORMAL LOW (ref 4.22–5.81)
RDW: 18.4 % — ABNORMAL HIGH (ref 11.5–15.5)
WBC: 12.1 10*3/uL — ABNORMAL HIGH (ref 4.0–10.5)
nRBC: 0.2 % (ref 0.0–0.2)

## 2020-08-30 LAB — BASIC METABOLIC PANEL
Anion gap: 4 — ABNORMAL LOW (ref 5–15)
BUN: 30 mg/dL — ABNORMAL HIGH (ref 6–20)
CO2: 41 mmol/L — ABNORMAL HIGH (ref 22–32)
Calcium: 8.4 mg/dL — ABNORMAL LOW (ref 8.9–10.3)
Chloride: 101 mmol/L (ref 98–111)
Creatinine, Ser: 0.71 mg/dL (ref 0.61–1.24)
GFR, Estimated: >60 mL/min (ref >60)
Glucose, Bld: 161 mg/dL — ABNORMAL HIGH (ref 70–99)
Potassium: 4 mmol/L (ref 3.5–5.1)
Sodium: 146 mmol/L — ABNORMAL HIGH (ref 135–145)

## 2020-08-30 LAB — TRIGLYCERIDES: Triglycerides: 115 mg/dL (ref <150)

## 2020-08-30 MED ORDER — FUROSEMIDE 10 MG/ML IJ SOLN
40.0000 mg | Freq: Once | INTRAMUSCULAR | Status: AC
  Administered 2020-08-30: 40 mg via INTRAVENOUS
  Filled 2020-08-30: qty 4

## 2020-08-30 NOTE — TOC Progression Note (Signed)
Transition of Care [facility]) - Progression Note    Patient Details  Name: Charles Weeks MRN: [id number] Date of Birth: May 09, 1966  Transition of Care [facility] CM/SW Contact  Astrid Drafts Berna Spare, RN Phone Number: 08/30/2020, 3:53 PM  Clinical Narrative:  [facility] Case Manager continues to follow for discharge planning; noted possible trach and PEG on Monday, 09/03/20.           Expected Discharge Plan: IP Rehab Facility Barriers to Discharge: Continued Medical Work up  Expected Discharge Plan and Services Expected Discharge Plan: IP Rehab Facility   Discharge Planning Services: CM Consult   Living arrangements for the past 2 months: Single Family Home                                       Social Determinants of Health (SDOH) Interventions    Readmission Risk Interventions No flowsheet data found.  Quintella Baton, RN, BSN  Trauma/Neuro ICU Case Manager [phone]

## 2020-08-30 NOTE — Progress Notes (Signed)
Patient ID: Charles Weeks, male   DOB: 1965-09-30, 55 y.o.   MRN: [id number] I called his daughter, Charles Weeks, and updated her on his progress. I also discussed my recommendation of going ahead with a trach/PEG. She reports she spoke with Palliative Care and is agreeable to going ahead on Monday, provided he does still need it then. I did discuss the procedures, risks, and benefits. I will also call her Saturday to discuss again and do consent. I will tentatively post in the OR. She reports her daughter has COVID so she is not coming in to the hospital.  Violeta Gelinas, MD, MPH, FACS Please use [email or web address] to contact on call provider

## 2020-08-30 NOTE — Progress Notes (Signed)
Patient ID: Charles Weeks, male   DOB: 1966/05/12, 55 y.o.   MRN: [id number] Follow up - Trauma Critical Care  Patient Details:    Charles Weeks is an 55 y.o. male.  Lines/tubes : Airway 7.5 mm (Active)  Secured at (cm) 26 cm 08/30/20 0753  Measured From Lips 08/30/20 0753  Secured Location Left 08/30/20 0753  Secured By [employer] 08/30/20 0753  Tube Holder Repositioned Yes 08/30/20 0753  Prone position No 08/30/20 0753  Cuff Pressure (cm H2O) Clear OR 27-39 CmH2O 08/30/20 0753  Site Condition Dry;Edema 08/30/20 0753     CVC Triple Lumen 08/18/20 Right Subclavian (Active)  Indication for Insertion or Continuance of Line Prolonged intravenous therapies;Poor Vasculature-patient has had multiple peripheral attempts or PIVs lasting less than 24 hours 08/29/20 2000  Site Assessment Clean;Dry;Intact 08/29/20 2000  Proximal Lumen Status Flushed;Infusing 08/29/20 2000  Medial Lumen Status Flushed;Infusing 08/29/20 2000  Distal Lumen Status Flushed;Infusing 08/29/20 2000  Dressing Type Transparent;Occlusive 08/29/20 2000  Dressing Status Clean;Dry;Intact 08/29/20 2000  Antimicrobial disc in place? Yes 08/29/20 2000  Line Care Connections checked and tightened;Proximal tubing changed 08/29/20 0800  Dressing Intervention Dressing changed 08/26/20 0300  Dressing Change Due 09/02/20 08/29/20 0800     Arterial Line 08/21/20 Right Radial (Active)  Site Assessment Clean;Dry;Intact 08/29/20 2000  Line Status Pulsatile blood flow 08/29/20 2000  Art Line Waveform Appropriate 08/29/20 2000  Art Line Interventions Leveled;Connections checked and tightened 08/29/20 2000  Color/Movement/Sensation Capillary refill less than 3 sec 08/29/20 2000  Dressing Type Transparent;Occlusive 08/29/20 2000  Dressing Status Clean;Dry;Intact;Antimicrobial disc in place 08/29/20 2000  Dressing Change Due 08/28/20 08/28/20 2000     Chest Tube Lateral;Right (Active)  Status To water seal 08/29/20 2000   Chest Tube Air Leak None 08/29/20 2000  Patency Intervention Tip/tilt 08/29/20 2000  Drainage Description Serosanguineous 08/29/20 2000  Dressing Status Clean;Dry;Intact 08/29/20 2000  Dressing Intervention Dressing reinforced 08/27/20 2000  Site Assessment Other (Comment) 08/29/20 2000  Surrounding Skin Dry;Intact 08/29/20 2000  Output (mL) 150 mL 08/30/20 0600     NG/OG Tube Orogastric Center mouth Xray (Active)  External Length of Tube (cm) - (if applicable) 66 cm 08/29/20 2000  Site Assessment Clean;Dry;Intact 08/29/20 2000  Ongoing Placement Verification No change in respiratory status 08/29/20 2000  Status Infusing tube feed 08/29/20 2000  Drainage Appearance Manson Passey 08/20/20 0800  Intake (mL) 60 mL 08/28/20 1700  Output (mL) 0 mL 08/29/20 1800     External Urinary Catheter (Active)    Microbiology/Sepsis markers: Results for orders placed or performed during the hospital encounter of 08/17/20  Resp Panel by RT-PCR (Flu A&B, Covid) Nasopharyngeal Swab     Status: None   Collection Time: 08/17/20  3:40 PM   Specimen: Nasopharyngeal Swab; Nasopharyngeal(NP) swabs in vial transport medium  Result Value Ref Range Status   SARS Coronavirus 2 by RT PCR NEGATIVE NEGATIVE Final    Comment: (NOTE) SARS-CoV-2 target nucleic acids are NOT DETECTED.  The SARS-CoV-2 RNA is generally detectable in upper respiratory specimens during the acute phase of infection. The lowest concentration of SARS-CoV-2 viral copies this assay can detect is 138 copies/mL. A negative result does not preclude SARS-Cov-2 infection and should not be used as the sole basis for treatment or other patient management decisions. A negative result may occur with  improper specimen collection/handling, submission of specimen other than nasopharyngeal swab, presence of viral mutation(s) within the areas targeted by this assay, and inadequate number of viral copies(<138 copies/mL). A negative result  must be combined  with clinical observations, patient history, and epidemiological information. The expected result is Negative.  Fact Sheet for Patients:  BloggerCourse.com  Fact Sheet for Healthcare Providers:  SeriousBroker.it  This test is no t yet approved or cleared by the [place] FDA and  has been authorized for detection and/or diagnosis of SARS-CoV-2 by FDA under an Emergency Use Authorization (EUA). This EUA will remain  in effect (meaning this test can be used) for the duration of the COVID-19 declaration under Section 564(b)(1) of the Act, 21 U.S.C.section 360bbb-3(b)(1), unless the authorization is terminated  or revoked sooner.       Influenza A by PCR NEGATIVE NEGATIVE Final   Influenza B by PCR NEGATIVE NEGATIVE Final    Comment: (NOTE) The Xpert Xpress SARS-CoV-2/FLU/RSV plus assay is intended as an aid in the diagnosis of influenza from Nasopharyngeal swab specimens and should not be used as a sole basis for treatment. Nasal washings and aspirates are unacceptable for Xpert Xpress SARS-CoV-2/FLU/RSV testing.  Fact Sheet for Patients: BloggerCourse.com  Fact Sheet for Healthcare Providers: SeriousBroker.it  This test is not yet approved or cleared by the [place] FDA and has been authorized for detection and/or diagnosis of SARS-CoV-2 by FDA under an Emergency Use Authorization (EUA). This EUA will remain in effect (meaning this test can be used) for the duration of the COVID-19 declaration under Section 564(b)(1) of the Act, 21 U.S.C. section 360bbb-3(b)(1), unless the authorization is terminated or revoked.  Performed at [facility], [address], [place], [place] [address]   Surgical PCR screen     Status: None   Collection Time: 08/19/20  7:38 AM   Specimen: Nasal Mucosa; Nasal Swab  Result Value Ref Range Status   MRSA, PCR NEGATIVE NEGATIVE Final    Staphylococcus aureus NEGATIVE NEGATIVE Final    Comment: (NOTE) The Xpert SA Assay (FDA approved for NASAL specimens in patients 28 years of age and older), is one component of a comprehensive surveillance program. It is not intended to diagnose infection nor to guide or monitor treatment. Performed at [facility], [address] [place], [place] [address]     Anti-infectives:  Anti-infectives (From admission, onward)   Start     Dose/Rate Route Frequency Ordered Stop   08/19/20 0845  cefTRIAXone (ROCEPHIN) 2 g in sodium chloride 0.9 % 100 mL IVPB  Status:  Discontinued        2 g 200 mL/hr over 30 Minutes Intravenous Every 24 hours 08/19/20 0753 08/25/20 1304   08/17/20 1515  ceFAZolin (ANCEF) IVPB 2g/100 mL premix        2 g 200 mL/hr over 30 Minutes Intravenous  Once 08/17/20 1500 08/17/20 1530      Best Practice/Protocols:  VTE Prophylaxis: Lovenox (prophylaxtic dose) Continous Sedation  Consults: Treatment Team:  Tia Alert, MD    Studies:    Events:  Subjective:    Overnight Issues:   Objective:  Vital signs for last 24 hours: Temp:  [98.1 F (36.7 C)-99 F (37.2 C)] 98.1 F (36.7 C) (04/14 0800) Pulse Rate:  [86-109] 86 (04/14 0700) Resp:  [11-18] 16 (04/14 0700) BP: (109-147)/(61-83) 127/77 (04/14 0700) SpO2:  [92 %-98 %] 95 % (04/14 0753) Arterial Line BP: (114-173)/(52-82) 156/76 (04/14 0700) FiO2 (%):  [50 %] 50 % (04/14 0753) Weight:  [101.8 kg] 101.8 kg (04/14 0500)  Hemodynamic parameters for last 24 hours:    Intake/Output from previous day: 04/13 0701 -  04/14 0700 In: 2195.6 [I.V.:275.6; NG/GT:1920] Out: 2270 [Urine:2000; Chest Tube:270]  Intake/Output this shift: No intake/output data recorded.  Vent settings for last 24 hours: Vent Mode: PRVC FiO2 (%):  [50 %] 50 % Set Rate:  [18 bmp] 18 bmp Vt Set:  [480 mL] 480 mL PEEP:  [5 cmH20] 5 cmH20 Plateau Pressure:  [17 cmH20-24 cmH20] 17 cmH20  Physical Exam:   General: on vent Neuro: F/C HEENT/Neck: ETT and collar Resp: clear to auscultation bilaterally CVS: RRR GI: mild dist, soft, NT Extremities: edema 2+  Results for orders placed or performed during the hospital encounter of 08/17/20 (from the past 24 hour(s))  BLOOD TRANSFUSION REPORT - SCANNED     Status: None   Collection Time: 08/29/20 10:41 AM   Narrative   Ordered by an unspecified provider.  Glucose, capillary     Status: Abnormal   Collection Time: 08/29/20 11:44 AM  Result Value Ref Range   Glucose-Capillary 169 (H) 70 - 99 mg/dL  Glucose, capillary     Status: Abnormal   Collection Time: 08/29/20  3:38 PM  Result Value Ref Range   Glucose-Capillary 145 (H) 70 - 99 mg/dL  Glucose, capillary     Status: Abnormal   Collection Time: 08/29/20  8:04 PM  Result Value Ref Range   Glucose-Capillary 154 (H) 70 - 99 mg/dL  Glucose, capillary     Status: None   Collection Time: 08/29/20 11:48 PM  Result Value Ref Range   Glucose-Capillary 89 70 - 99 mg/dL  Glucose, capillary     Status: Abnormal   Collection Time: 08/30/20  3:41 AM  Result Value Ref Range   Glucose-Capillary 136 (H) 70 - 99 mg/dL  Triglycerides     Status: None   Collection Time: 08/30/20  5:00 AM  Result Value Ref Range   Triglycerides 115 <150 mg/dL  CBC     Status: Abnormal   Collection Time: 08/30/20  5:00 AM  Result Value Ref Range   WBC 12.1 (H) 4.0 - 10.5 K/uL   RBC 2.41 (L) 4.22 - 5.81 MIL/uL   Hemoglobin 7.6 (L) 13.0 - 17.0 g/dL   HCT 69.6 (L) 78.9 - 38.1 %   MCV 107.5 (H) 80.0 - 100.0 fL   MCH 31.5 26.0 - 34.0 pg   MCHC 29.3 (L) 30.0 - 36.0 g/dL   RDW 01.7 (H) 51.0 - 25.8 %   Platelets 280 150 - 400 K/uL   nRBC 0.2 0.0 - 0.2 %  Basic metabolic panel     Status: Abnormal   Collection Time: 08/30/20  5:00 AM  Result Value Ref Range   Sodium 146 (H) 135 - 145 mmol/L   Potassium 4.0 3.5 - 5.1 mmol/L   Chloride 101 98 - 111 mmol/L   CO2 41 (H) 22 - 32 mmol/L   Glucose, Bld 161 (H) 70 - 99  mg/dL   BUN 30 (H) 6 - 20 mg/dL   Creatinine, Ser 5.27 0.61 - 1.24 mg/dL   Calcium 8.4 (L) 8.9 - 10.3 mg/dL   GFR, Estimated >78 >24 mL/min   Anion gap 4 (L) 5 - 15  Glucose, capillary     Status: Abnormal   Collection Time: 08/30/20  7:55 AM  Result Value Ref Range   Glucose-Capillary 153 (H) 70 - 99 mg/dL    Assessment & Plan: Present on Admission: . Thoracic spine fracture (HCC)    LOS: 13 days   Additional comments:I reviewed the patient's new clinical lab test  results. . Fall from height  R PTX- CT in place on water seal,270cc out overnight so continue today Multiple B rib fxs- these are at the junction of the thoracic vertebral bodies, pain control, IS 3 column T8 vertebral body fx-perDr. Jones,non-op now, plan mobilize in brace once off vent. Needs CTO brace for HOB>30 degrees T7 neural arch fx- per nsgy R TVP T4-8 - pain control, per nsgy R C7 TVP fx through foramen- C collar per Dr. Yetta Barre, CTAnegatvie Large scalp laceration/right eyebrow laceration- repair by Dr. Lovick4/05/2020, sutures removed4/11 Right neck abrasion/skin tear- bacitracin BID Facial ecchymosis- CT maxillofacialdemonstrates no facial bone fractures. Some anterior subluxation of the right mandibular condyle HTN- home lopressor 25mg  daily, restartedat half dose and HR lower at times CAD/H/O MI/DES- on ASA at home. Hold for now. 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 Poor weaning. Anticipate need for trach, ongoing family discussion today. Plan for trach/PEG in OR Monday Rash on back - benadryl Hypotension- resolved, off pressors Depression/anxiety- restart home bupropion whentaking PO, restarted home buspar BPH- urecholine, home terazosin, D/C foley again today for voiding trial ABL anemia FEN-TF, bowel regimen, lasix  x 1 now VTE-LMWH Dispo- ICU,See above.  Critical Care Total Time*: 40 Minutes  Monday, MD, MPH, FACS Trauma & General Surgery Use [email or web address] to contact on call provider  08/30/2020  *Care during the described time interval was provided by me. I have reviewed this patient's available data, including medical history, events of note, physical examination and test results as part of my evaluation.

## 2020-08-31 ENCOUNTER — Inpatient Hospital Stay (HOSPITAL_COMMUNITY): Payer: Medicaid Other

## 2020-08-31 DIAGNOSIS — Z515 Encounter for palliative care: Secondary | ICD-10-CM | POA: Diagnosis not present

## 2020-08-31 DIAGNOSIS — S22000A Wedge compression fracture of unspecified thoracic vertebra, initial encounter for closed fracture: Secondary | ICD-10-CM | POA: Diagnosis not present

## 2020-08-31 DIAGNOSIS — T1490XA Injury, unspecified, initial encounter: Secondary | ICD-10-CM | POA: Diagnosis not present

## 2020-08-31 LAB — GLUCOSE, CAPILLARY
Glucose-Capillary: 123 mg/dL — ABNORMAL HIGH (ref 70–99)
Glucose-Capillary: 161 mg/dL — ABNORMAL HIGH (ref 70–99)
Glucose-Capillary: 156 mg/dL — ABNORMAL HIGH (ref 70–99)
Glucose-Capillary: 136 mg/dL — ABNORMAL HIGH (ref 70–99)
Glucose-Capillary: 120 mg/dL — ABNORMAL HIGH (ref 70–99)

## 2020-08-31 LAB — BASIC METABOLIC PANEL
Anion gap: 4 — ABNORMAL LOW (ref 5–15)
Anion gap: 6 (ref 5–15)
BUN: 29 mg/dL — ABNORMAL HIGH (ref 6–20)
BUN: 32 mg/dL — ABNORMAL HIGH (ref 6–20)
CO2: 41 mmol/L — ABNORMAL HIGH (ref 22–32)
CO2: 40 mmol/L — ABNORMAL HIGH (ref 22–32)
Calcium: 8.6 mg/dL — ABNORMAL LOW (ref 8.9–10.3)
Calcium: 8.6 mg/dL — ABNORMAL LOW (ref 8.9–10.3)
Chloride: 99 mmol/L (ref 98–111)
Chloride: 98 mmol/L (ref 98–111)
Creatinine, Ser: 0.7 mg/dL (ref 0.61–1.24)
Creatinine, Ser: 0.74 mg/dL (ref 0.61–1.24)
GFR, Estimated: >60 mL/min (ref >60)
GFR, Estimated: >60 mL/min (ref >60)
Glucose, Bld: 159 mg/dL — ABNORMAL HIGH (ref 70–99)
Glucose, Bld: 124 mg/dL — ABNORMAL HIGH (ref 70–99)
Potassium: 3.6 mmol/L (ref 3.5–5.1)
Potassium: 3.9 mmol/L (ref 3.5–5.1)
Sodium: 144 mmol/L (ref 135–145)
Sodium: 144 mmol/L (ref 135–145)

## 2020-08-31 LAB — CBC
HCT: 26.6 % — ABNORMAL LOW (ref 39.0–52.0)
Hemoglobin: 7.8 g/dL — ABNORMAL LOW (ref 13.0–17.0)
MCH: 31.3 pg (ref 26.0–34.0)
MCHC: 29.3 g/dL — ABNORMAL LOW (ref 30.0–36.0)
MCV: 106.8 fL — ABNORMAL HIGH (ref 80.0–100.0)
Platelets: 288 10*3/uL (ref 150–400)
RBC: 2.49 MIL/uL — ABNORMAL LOW (ref 4.22–5.81)
RDW: 18.3 % — ABNORMAL HIGH (ref 11.5–15.5)
WBC: 11.7 10*3/uL — ABNORMAL HIGH (ref 4.0–10.5)
nRBC: 0.3 % — ABNORMAL HIGH (ref 0.0–0.2)

## 2020-08-31 MED ORDER — POTASSIUM CHLORIDE 20 MEQ PO PACK
40.0000 meq | PACK | Freq: Once | ORAL | Status: AC
  Administered 2020-08-31: 40 meq

## 2020-08-31 MED ORDER — POTASSIUM CHLORIDE 20 MEQ PO PACK
40.0000 meq | PACK | ORAL | Status: AC
  Administered 2020-08-31: 40 meq
  Filled 2020-08-31: qty 2

## 2020-08-31 MED ORDER — IPRATROPIUM-ALBUTEROL 0.5-2.5 (3) MG/3ML IN SOLN
3.0000 mL | RESPIRATORY_TRACT | Status: DC
  Administered 2020-08-31 – 2020-09-06 (×33): 3 mL via RESPIRATORY_TRACT
  Filled 2020-08-31 (×29): qty 3

## 2020-08-31 MED ORDER — DEXMEDETOMIDINE HCL IN NACL 400 MCG/100ML IV SOLN
0.4000 ug/kg/h | INTRAVENOUS | Status: DC
  Administered 2020-08-31 (×2): 0.4 ug/kg/h via INTRAVENOUS
  Administered 2020-09-01: 0.3 ug/kg/h via INTRAVENOUS
  Filled 2020-08-31 (×3): qty 100

## 2020-08-31 MED ORDER — POTASSIUM CHLORIDE 20 MEQ PO PACK
40.0000 meq | PACK | ORAL | Status: DC
  Filled 2020-08-31: qty 2

## 2020-08-31 MED ORDER — FUROSEMIDE 10 MG/ML IJ SOLN
80.0000 mg | Freq: Once | INTRAMUSCULAR | Status: AC
  Administered 2020-08-31: 80 mg via INTRAVENOUS
  Filled 2020-08-31: qty 8

## 2020-08-31 MED ORDER — METOPROLOL TARTRATE 25 MG PO TABS
25.0000 mg | ORAL_TABLET | Freq: Every day | ORAL | Status: DC
  Administered 2020-08-31 – 2020-10-09 (×40): 25 mg
  Filled 2020-08-31 (×40): qty 1

## 2020-08-31 MED ORDER — ATROPINE SULFATE 1 MG/10ML IJ SOSY
PREFILLED_SYRINGE | INTRAMUSCULAR | Status: AC
  Filled 2020-08-31: qty 10

## 2020-08-31 MED ORDER — BETHANECHOL CHLORIDE 25 MG PO TABS
50.0000 mg | ORAL_TABLET | Freq: Three times a day (TID) | ORAL | Status: DC
  Administered 2020-08-31 – 2020-10-09 (×116): 50 mg
  Filled 2020-08-31 (×2): qty 5
  Filled 2020-08-31: qty 2
  Filled 2020-08-31 (×6): qty 5
  Filled 2020-08-31 (×3): qty 2
  Filled 2020-08-31 (×12): qty 5
  Filled 2020-08-31 (×2): qty 2
  Filled 2020-08-31 (×28): qty 5
  Filled 2020-08-31: qty 2
  Filled 2020-08-31 (×10): qty 5
  Filled 2020-08-31: qty 2
  Filled 2020-08-31 (×8): qty 5
  Filled 2020-08-31: qty 2
  Filled 2020-08-31 (×4): qty 5
  Filled 2020-08-31: qty 2
  Filled 2020-08-31 (×14): qty 5
  Filled 2020-08-31: qty 2
  Filled 2020-08-31 (×18): qty 5
  Filled 2020-08-31 (×2): qty 2
  Filled 2020-08-31 (×9): qty 5

## 2020-08-31 NOTE — Progress Notes (Addendum)
PMT -   Talked with [name] on the phone.  She is struggling with making the "Correct" decisions for her father.  She wants to move forward with trach but asked if we could delay the PEG as long as possible.  She was hoping that with speech therapy he may not need the PEG.   I explained that he may not be able to eat with the trac for a while.  Usually an NG is placed but he will likely need a PEG until he can breath so well with the trach that he learns to eat with it as well.  PEGs can be temporary or permanent.  She understands - but states "I just don't want anything to happen to my Dad".  [name] relayed that she has been trying to study and learn about what she will need to know for her father.   As we talked she told me that her Uncle said "I never want to be connected to machines"  [name] responded that "The medical team made this decision we did not have a choice" but she confesses "I probably would have told them to try to save him".  [name] asked about the xrays done a few days ago.  Did they show whether or not his back can be operated on?  I explained that they did not provide that information but that the NS feels it has a chance to heal on its own without surgery.  More xrays will be done in a different position after extubation to gather more information regarding the T7 vertebrae.  Assessment:  [name] (daughter) struggling with decisions.  Could use support and guidance.  PMT will continue to follow.  Norvel Richards, PA-C Palliative Medicine Office:  [phone]  15 min.  Greater than 50%  of this time was spent counseling and coordinating care related to the above assessment and plan.

## 2020-08-31 NOTE — Progress Notes (Addendum)
Pt HR suddenly dropped into 40s and high 30s for approx 30-45 seconds. Pt did not appear to be biting tube. No apparent oxygen desaturation noted. Suctioned patient at that time and HR did come back up after suctioning. Pt did experience one additional brief episode where HR when into the 40s for only about 5 seconds.  RT and MD Lovick came to bedside. Vital signs stable with HR in the 80s at this time. Will continue to monitor.

## 2020-08-31 NOTE — Consult Note (Signed)
WOC Nurse Consult Note: Patient receiving care in [facility] [phone]. Reason for Consult: penis wound? Wound type: small white ulceration on the inferior border of the urinary meatus, related to former use of an indwelling urinary catheter. Staff are currently using a PrimoFit urinary pouch. Pressure Injury POA: Yes/No/NA Measurement: Wound bed: Drainage (amount, consistency, odor)  Periwound: Dressing procedure/placement/frequency: Monitor area for worsening. WOC nurse will not follow at this time.  Please re-consult the WOC team if needed.  Helmut Muster, RN, MSN, CWOCN, CNS-BC, pager [phone]

## 2020-08-31 NOTE — Progress Notes (Signed)
Daily Progress Note   Patient Name: Charles Weeks       Date: 08/30/2020 DOB: Aug 30, 1965  Age: 55 y.o. MRN#: [id number] Attending Physician: Roslynn Amble, MD Primary Care Physician: No primary care provider on file. Admit Date: 08/17/2020  Reason for Consultation/Follow-up:   To discuss complex medical decision making related to patient's goals of care  Patient remains intubated. In hard collar and soft body brace.  He does not wake to my voice or light touch.     Subjective: Per RN he will wake and follow commands.  Able to move all 4 extremities.   He weaned for 2.5 hours today but then became very fatigued. Tracheostomy is likely planned for Monday.   Assessment: Fatigues quickly, thus unable to maintain his airway.  I"m concerned about pain being a limiting factor for him.  Will likely need a good pain medication regimen.   Patient Profile/HPI:  55 y.o. male with past medical history of COPD gold, CAD, depression and anxiety who was admitted on 08/17/2020 after a fall from a significant height.  He was noted to have a large right pneumothorax requiring chest tube placement, multiple bilateral rib fractures, T8 fracture, C7 fracture.  Neurosurgery evaluated the patient and felt the fractures were non-operable.  Charles Weeks remains intubated.  He has had difficulty with thick secretions and agitation on the vent requiring propofol.  We were consulted for goals of care.    Length of Stay: 14   Vital Signs: BP 135/77   Pulse (!) 101   Temp 99.1 F (37.3 C) (Oral)   Resp 11   Ht 5\' 8"  (1.727 m)   Wt 104.5 kg   SpO2 (!) 89%   BMI 35.03 kg/m  SpO2: SpO2: (!) 89 % O2 Device: O2 Device: Ventilator O2 Flow Rate: O2 Flow Rate (L/min): 10 L/min       Palliative Assessment/Data:   10%     Palliative Care Plan    Recommendations/Plan: PMT will continue to follow along intermittently and touch base with the family.  Code Status:  Full code  Prognosis:  Unable to determine. Unfortunately he is fragile and is at high risk for acute decompensation.   Discharge Planning: Skilled Nursing Facility for rehab with Palliative care service follow-up  Care plan was discussed with RN  Thank you  for allowing the Palliative Medicine Team to assist in the care of this patient.  Total time spent:  15 min     Greater than 50%  of this time was spent counseling and coordinating care related to the above assessment and plan.  Norvel Richards, PA-C Palliative Medicine  Please contact Palliative MedicineTeam phone at [phone] for questions and concerns between 7 am - 7 pm.   Please see AMION for individual provider pager numbers.

## 2020-08-31 NOTE — Progress Notes (Signed)
Trauma/Critical Care Follow Up Note  Subjective:    Overnight Issues:   Objective:  Vital signs for last 24 hours: Temp:  [97.9 F (36.6 C)-99.3 F (37.4 C)] 99.1 F (37.3 C) (04/15 0400) Pulse Rate:  [80-104] 95 (04/15 0800) Resp:  [7-19] 9 (04/15 0800) BP: (99-158)/(56-95) 144/95 (04/15 0800) SpO2:  [88 %-95 %] 95 % (04/15 0801) Arterial Line BP: (132-172)/(64-79) 132/64 (04/14 1000) FiO2 (%):  [40 %-50 %] 40 % (04/15 0801) Weight:  [104.5 kg] 104.5 kg (04/15 0345)  Hemodynamic parameters for last 24 hours:    Intake/Output from previous day: 04/14 0701 - 04/15 0700 In: 3683 [I.V.:343; NG/GT:3340] Out: 2700 [Urine:2575; Chest Tube:125]  Intake/Output this shift: No intake/output data recorded.  Vent settings for last 24 hours: Vent Mode: PRVC FiO2 (%):  [40 %-50 %] 40 % Set Rate:  [18 bmp] 18 bmp Vt Set:  [480 mL] 480 mL PEEP:  [5 cmH20-8 cmH20] 8 cmH20 Pressure Support:  [5 cmH20] 5 cmH20 Plateau Pressure:  [17 cmH20-20 cmH20] 20 cmH20  Physical Exam:  Gen: comfortable, no distress Neuro: non-focal exam HEENT: PERRL Neck: supple CV: RRR Pulm: unlabored breathing on MV Abd: soft, NT GU: clear yellow urine Extr: wwp, 2+ edema   Results for orders placed or performed during the hospital encounter of 08/17/20 (from the past 24 hour(s))  Glucose, capillary     Status: Abnormal   Collection Time: 08/30/20 11:46 AM  Result Value Ref Range   Glucose-Capillary 172 (H) 70 - 99 mg/dL  Glucose, capillary     Status: Abnormal   Collection Time: 08/30/20  4:15 PM  Result Value Ref Range   Glucose-Capillary 155 (H) 70 - 99 mg/dL  Glucose, capillary     Status: Abnormal   Collection Time: 08/30/20  7:16 PM  Result Value Ref Range   Glucose-Capillary 192 (H) 70 - 99 mg/dL  Glucose, capillary     Status: Abnormal   Collection Time: 08/30/20 11:12 PM  Result Value Ref Range   Glucose-Capillary 139 (H) 70 - 99 mg/dL  Glucose, capillary     Status: Abnormal    Collection Time: 08/31/20  3:19 AM  Result Value Ref Range   Glucose-Capillary 123 (H) 70 - 99 mg/dL  CBC     Status: Abnormal   Collection Time: 08/31/20  4:49 AM  Result Value Ref Range   WBC 11.7 (H) 4.0 - 10.5 K/uL   RBC 2.49 (L) 4.22 - 5.81 MIL/uL   Hemoglobin 7.8 (L) 13.0 - 17.0 g/dL   HCT 17.6 (L) 16.0 - 73.7 %   MCV 106.8 (H) 80.0 - 100.0 fL   MCH 31.3 26.0 - 34.0 pg   MCHC 29.3 (L) 30.0 - 36.0 g/dL   RDW 10.6 (H) 26.9 - 48.5 %   Platelets 288 150 - 400 K/uL   nRBC 0.3 (H) 0.0 - 0.2 %  Basic metabolic panel     Status: Abnormal   Collection Time: 08/31/20  4:49 AM  Result Value Ref Range   Sodium 144 135 - 145 mmol/L   Potassium 3.6 3.5 - 5.1 mmol/L   Chloride 99 98 - 111 mmol/L   CO2 41 (H) 22 - 32 mmol/L   Glucose, Bld 159 (H) 70 - 99 mg/dL   BUN 29 (H) 6 - 20 mg/dL   Creatinine, Ser 4.62 0.61 - 1.24 mg/dL   Calcium 8.6 (L) 8.9 - 10.3 mg/dL   GFR, Estimated >70 >35 mL/min   Anion gap 4 (  L) 5 - 15  Glucose, capillary     Status: Abnormal   Collection Time: 08/31/20  7:49 AM  Result Value Ref Range   Glucose-Capillary 161 (H) 70 - 99 mg/dL    Assessment & Plan: The plan of care was discussed with the bedside nurse for the day, who is in agreement with this plan and no additional concerns were raised.   Present on Admission: . Thoracic spine fracture (HCC)    LOS: 14 days   Additional comments:I reviewed the patient's new clinical lab test results.   and I reviewed the patients new imaging test results.    Fall from height  R PTX- CT in place on water seal,125cc out overnight continue in the setting of diuresis Multiple B rib fxs- these are at the junction of the thoracic vertebral bodies, pain control, IS 3 column T8 vertebral body fx-perDr. Jones,non-op now, plan mobilize in brace once off vent. Needs CTO brace for HOB>30 degrees T7 neural arch fx- per nsgy R TVP T4-8 - pain control, per nsgy R C7 TVP fx through foramen- C collar per Dr. Yetta Barre,  CTAnegatvie Large scalp laceration/right eyebrow laceration- repair by Dr. Lovick4/05/2020, sutures removed4/11 Right neck abrasion/skin tear- bacitracin BID Facial ecchymosis- CT maxillofacialdemonstrates no facial bone fractures. Some anterior subluxation of the right mandibular condyle HTN- home lopressor 25mg  daily, restartedat half dose increase to full dose today CAD/H/O MI/DES- on ASA at home. Hold for now. 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 Poor weaning. Anticipate need for trach, plan for trach/PEG in OR Monday. Change sedation from propofol to precedex. Rash on back - benadryl Hypotension- resolved, off pressors Depression/anxiety- restart home bupropion whentaking PO, restarted home buspar BPH-home terazosin, failed ToV again, increase bethanachol today and replace foley ABL anemia- stable FEN-TF,bowel regimen, lasix 80 this AM and given 80 of K, consider repeat dose this PM, BMP this PM VTE-LMWH Dispo- ICU  Critical Care Total Time: 45 minutes  Tuesday, MD Trauma & General Surgery Please use [email or web address] to contact on call provider  08/31/2020  *Care during the described time interval was provided by me. I have reviewed this patient's available data, including medical history, events of note, physical examination and test results as part of my evaluation.

## 2020-09-01 DIAGNOSIS — L8990 Pressure ulcer of unspecified site, unspecified stage: Secondary | ICD-10-CM | POA: Insufficient documentation

## 2020-09-01 LAB — GLUCOSE, CAPILLARY
Glucose-Capillary: 121 mg/dL — ABNORMAL HIGH (ref 70–99)
Glucose-Capillary: 136 mg/dL — ABNORMAL HIGH (ref 70–99)
Glucose-Capillary: 143 mg/dL — ABNORMAL HIGH (ref 70–99)
Glucose-Capillary: 144 mg/dL — ABNORMAL HIGH (ref 70–99)
Glucose-Capillary: 145 mg/dL — ABNORMAL HIGH (ref 70–99)
Glucose-Capillary: 150 mg/dL — ABNORMAL HIGH (ref 70–99)
Glucose-Capillary: 159 mg/dL — ABNORMAL HIGH (ref 70–99)

## 2020-09-01 MED ORDER — ATROPINE SULFATE 1 MG/10ML IJ SOSY: 1.0000 mg | PREFILLED_SYRINGE | INTRAMUSCULAR | Status: DC | PRN

## 2020-09-01 MED ORDER — PROPOFOL 1000 MG/100ML IV EMUL
5.0000 ug/kg/min | INTRAVENOUS | Status: DC
  Administered 2020-09-01: 5 ug/kg/min via INTRAVENOUS
  Administered 2020-09-01: 15 ug/kg/min via INTRAVENOUS
  Administered 2020-09-02: 10 ug/kg/min via INTRAVENOUS
  Administered 2020-09-03: 20 ug/kg/min via INTRAVENOUS
  Administered 2020-09-03: 30 ug/kg/min via INTRAVENOUS
  Administered 2020-09-03: 10 ug/kg/min via INTRAVENOUS
  Administered 2020-09-04 (×2): 20 ug/kg/min via INTRAVENOUS
  Administered 2020-09-04: 30 ug/kg/min via INTRAVENOUS
  Administered 2020-09-05 (×2): 20 ug/kg/min via INTRAVENOUS
  Filled 2020-09-01 (×11): qty 100

## 2020-09-01 MED ORDER — POTASSIUM CHLORIDE 20 MEQ PO PACK
40.0000 meq | PACK | Freq: Once | ORAL | Status: AC
  Administered 2020-09-01: 40 meq
  Filled 2020-09-01: qty 2

## 2020-09-01 MED ORDER — ATROPINE SULFATE 1 MG/10ML IJ SOSY: 1.0000 mg | PREFILLED_SYRINGE | Freq: Once | INTRAMUSCULAR | Status: DC

## 2020-09-01 MED ORDER — FUROSEMIDE 10 MG/ML IJ SOLN
40.0000 mg | Freq: Two times a day (BID) | INTRAMUSCULAR | Status: AC
  Administered 2020-09-01 (×2): 40 mg via INTRAVENOUS
  Filled 2020-09-01 (×2): qty 4

## 2020-09-01 NOTE — Progress Notes (Signed)
PT Cancellation Note  Patient Details Name: Charles Weeks MRN: [id number] DOB: 1965/06/01   Cancelled Treatment:    Reason Eval/Treat Not Completed: Patient not medically ready. Vent settings at 50% concentration with PEEP of 8 and RN reporting when pt is awake he becomes agitated with decline in his vitals. RN also reporting episodes of bradycardia last night. Will hold PT eval at this time and follow-up as appropriate another day.   Raymond Gurney, PT, DPT Acute Rehabilitation Services  Pager: [phone] Office: [phone]    Jewel Baize 09/01/2020, 8:30 AM

## 2020-09-01 NOTE — Progress Notes (Signed)
Patient HR dropped to mid 20's for around 20-30 seconds with the monitor briefly reading asystole however, no loss of pulse was noted and patients O2 sat was in the 90's the whole time.  Patients HR came back up to the 80's and all other VSS.  Dr Bedelia Person made aware, order for atropine at bedside and too discontinue precedex and start propofol.  Will continue to monitor.

## 2020-09-01 NOTE — Progress Notes (Signed)
Patient ID: Charles Weeks, male   DOB: [birth year], 55 y.o.   MRN: [id number] Follow up - Trauma Critical Care  Patient Details:    Charles Weeks is an 55 y.o. male.  Lines/tubes : Airway 7.5 mm (Active)  Secured at (cm) 26 cm 09/01/20 0337  Measured From Lips 09/01/20 0337  Secured Location Center 09/01/20 9629  Secured By [employer] 09/01/20 0337  Tube Holder Repositioned Yes 09/01/20 0337  Prone position No 08/31/20 1655  Cuff Pressure (cm H2O) Clear OR 27-39 CmH2O 08/30/20 0753  Site Condition Dry 08/31/20 2000     CVC Triple Lumen 08/18/20 Right Subclavian (Active)  Indication for Insertion or Continuance of Line Prolonged intravenous therapies 08/31/20 1945  Site Assessment Clean;Dry;Intact 08/31/20 1945  Proximal Lumen Status Infusing 08/31/20 1945  Medial Lumen Status Infusing 08/31/20 1945  Distal Lumen Status Flushed;Saline locked 08/31/20 1945  Dressing Type Transparent;Occlusive 08/31/20 1945  Dressing Status Clean;Dry;Intact 08/31/20 1945  Antimicrobial disc in place? Yes 08/31/20 1945  Line Care Distal cap changed 08/31/20 1400  Dressing Intervention Dressing changed 08/26/20 0300  Dressing Change Due 09/02/20 08/31/20 1945     Chest Tube Lateral;Right (Active)  Status To water seal 08/31/20 2000  Chest Tube Air Leak None 08/31/20 2000  Patency Intervention Tip/tilt 08/31/20 2000  Drainage Description Serosanguineous 08/31/20 2000  Dressing Status Clean;Dry;Intact 08/31/20 2000  Dressing Intervention Other (Comment) 08/31/20 0800  Site Assessment Other (Comment) 08/29/20 2000  Surrounding Skin Unable to view 08/31/20 2000  Output (mL) 60 mL 09/01/20 0600     NG/OG Tube Orogastric Center mouth Xray (Active)  External Length of Tube (cm) - (if applicable) 60.5 cm 08/31/20 0800  Site Assessment Clean;Dry;Intact 08/31/20 2000  Ongoing Placement Verification Xray 08/31/20 0800  Status Infusing tube feed 08/31/20 2000  Drainage Appearance Brown  08/20/20 0800  Intake (mL) 100 mL 08/30/20 1030  Output (mL) 0 mL 08/29/20 1800     Urethral Catheter K Boland RN Latex;Straight-tip 16 Fr. (Active)  Indication for Insertion or Continuance of Catheter Acute urinary retention (I&O Cath for 24 hrs prior to catheter insertion- Inpatient Only) 08/31/20 2000  Site Assessment Clean;Intact;Swelling;Red;Edema 08/31/20 2000  Catheter Maintenance Bag below level of bladder;Catheter secured;Drainage bag/tubing not touching floor;No dependent loops;Insertion date on drainage bag;Seal intact 08/31/20 2000  Collection Container Standard drainage bag 08/31/20 2000  Securement Method Securing device (Describe) 08/31/20 2000  Urinary Catheter Interventions (if applicable) Unclamped 08/31/20 2000  Output (mL) 700 mL 09/01/20 0600    Microbiology/Sepsis markers: Results for orders placed or performed during the hospital encounter of 08/17/20  Resp Panel by RT-PCR (Flu A&B, Covid) Nasopharyngeal Swab     Status: None   Collection Time: 08/17/20  3:40 PM   Specimen: Nasopharyngeal Swab; Nasopharyngeal(NP) swabs in vial transport medium  Result Value Ref Range Status   SARS Coronavirus 2 by RT PCR NEGATIVE NEGATIVE Final    Comment: (NOTE) SARS-CoV-2 target nucleic acids are NOT DETECTED.  The SARS-CoV-2 RNA is generally detectable in upper respiratory specimens during the acute phase of infection. The lowest concentration of SARS-CoV-2 viral copies this assay can detect is 138 copies/mL. A negative result does not preclude SARS-Cov-2 infection and should not be used as the sole basis for treatment or other patient management decisions. A negative result may occur with  improper specimen collection/handling, submission of specimen other than nasopharyngeal swab, presence of viral mutation(s) within the areas targeted by this assay, and inadequate number of viral copies(<138 copies/mL). A negative result must  be combined with clinical observations,  patient history, and epidemiological information. The expected result is Negative.  Fact Sheet for Patients:  BloggerCourse.com  Fact Sheet for Healthcare Providers:  SeriousBroker.it  This test is no t yet approved or cleared by the [place] FDA and  has been authorized for detection and/or diagnosis of SARS-CoV-2 by FDA under an Emergency Use Authorization (EUA). This EUA will remain  in effect (meaning this test can be used) for the duration of the COVID-19 declaration under Section 564(b)(1) of the Act, 21 U.S.C.section 360bbb-3(b)(1), unless the authorization is terminated  or revoked sooner.       Influenza A by PCR NEGATIVE NEGATIVE Final   Influenza B by PCR NEGATIVE NEGATIVE Final    Comment: (NOTE) The Xpert Xpress SARS-CoV-2/FLU/RSV plus assay is intended as an aid in the diagnosis of influenza from Nasopharyngeal swab specimens and should not be used as a sole basis for treatment. Nasal washings and aspirates are unacceptable for Xpert Xpress SARS-CoV-2/FLU/RSV testing.  Fact Sheet for Patients: BloggerCourse.com  Fact Sheet for Healthcare Providers: SeriousBroker.it  This test is not yet approved or cleared by the [place] FDA and has been authorized for detection and/or diagnosis of SARS-CoV-2 by FDA under an Emergency Use Authorization (EUA). This EUA will remain in effect (meaning this test can be used) for the duration of the COVID-19 declaration under Section 564(b)(1) of the Act, 21 U.S.C. section 360bbb-3(b)(1), unless the authorization is terminated or revoked.  Performed at [facility], [address] [place], [place] [address]   Surgical PCR screen     Status: None   Collection Time: 08/19/20  7:38 AM   Specimen: Nasal Mucosa; Nasal Swab  Result Value Ref Range Status   MRSA, PCR NEGATIVE NEGATIVE Final   Staphylococcus aureus  NEGATIVE NEGATIVE Final    Comment: (NOTE) The Xpert SA Assay (FDA approved for NASAL specimens in patients 47 years of age and older), is one component of a comprehensive surveillance program. It is not intended to diagnose infection nor to guide or monitor treatment. Performed at [facility], [address] [place], [place] [address]     Anti-infectives:  Anti-infectives (From admission, onward)   Start     Dose/Rate Route Frequency Ordered Stop   08/19/20 0845  cefTRIAXone (ROCEPHIN) 2 g in sodium chloride 0.9 % 100 mL IVPB  Status:  Discontinued        2 g 200 mL/hr over 30 Minutes Intravenous Every 24 hours 08/19/20 0753 08/25/20 1304   08/17/20 1515  ceFAZolin (ANCEF) IVPB 2g/100 mL premix        2 g 200 mL/hr over 30 Minutes Intravenous  Once 08/17/20 1500 08/17/20 1530    Consults: Treatment Team:  Tia Alert, MD    Studies:    Events:  Subjective:    Overnight Issues:   Objective:  Vital signs for last 24 hours: Temp:  [98.4 F (36.9 C)-99 F (37.2 C)] 98.4 F (36.9 C) (04/15 1500) Pulse Rate:  [70-119] 83 (04/16 0700) Resp:  [9-26] 17 (04/16 0700) BP: (92-202)/(58-95) 101/60 (04/16 0700) SpO2:  [86 %-99 %] 96 % (04/16 0700) FiO2 (%):  [40 %-60 %] 60 % (04/16 0400)  Hemodynamic parameters for last 24 hours:    Intake/Output from previous day: 04/15 0701 - 04/16 0700 In: 2573.3 [I.V.:313.3; NG/GT:2260] Out: 2545 [Urine:2395; Chest Tube:150]  Intake/Output this shift: No intake/output data recorded.  Vent settings for last 24 hours: Vent Mode:  PRVC FiO2 (%):  [40 %-60 %] 60 % Set Rate:  [18 bmp] 18 bmp Vt Set:  [480 mL] 480 mL PEEP:  [8 cmH20] 8 cmH20 Plateau Pressure:  [15 cmH20-26 cmH20] 15 cmH20  Physical Exam:  General: on vent Neuro: sedated but F/C HEENT/Neck: ETT and collar Resp: clear to auscultation bilaterally CVS: RRR GI: soft, NT Extremities: edema 2+  Results for orders placed or performed during the hospital  encounter of 08/17/20 (from the past 24 hour(s))  Glucose, capillary     Status: Abnormal   Collection Time: 08/31/20 11:52 AM  Result Value Ref Range   Glucose-Capillary 156 (H) 70 - 99 mg/dL  Glucose, capillary     Status: Abnormal   Collection Time: 08/31/20  3:21 PM  Result Value Ref Range   Glucose-Capillary 136 (H) 70 - 99 mg/dL  Basic metabolic panel     Status: Abnormal   Collection Time: 08/31/20  6:45 PM  Result Value Ref Range   Sodium 144 135 - 145 mmol/L   Potassium 3.9 3.5 - 5.1 mmol/L   Chloride 98 98 - 111 mmol/L   CO2 40 (H) 22 - 32 mmol/L   Glucose, Bld 124 (H) 70 - 99 mg/dL   BUN 32 (H) 6 - 20 mg/dL   Creatinine, Ser 1.94 0.61 - 1.24 mg/dL   Calcium 8.6 (L) 8.9 - 10.3 mg/dL   GFR, Estimated >17 >40 mL/min   Anion gap 6 5 - 15  Glucose, capillary     Status: Abnormal   Collection Time: 08/31/20  7:43 PM  Result Value Ref Range   Glucose-Capillary 120 (H) 70 - 99 mg/dL  Glucose, capillary     Status: Abnormal   Collection Time: 09/01/20 12:02 AM  Result Value Ref Range   Glucose-Capillary 159 (H) 70 - 99 mg/dL  Glucose, capillary     Status: Abnormal   Collection Time: 09/01/20  4:18 AM  Result Value Ref Range   Glucose-Capillary 145 (H) 70 - 99 mg/dL  Glucose, capillary     Status: Abnormal   Collection Time: 09/01/20  7:46 AM  Result Value Ref Range   Glucose-Capillary 143 (H) 70 - 99 mg/dL    Assessment & Plan: Present on Admission: . Thoracic spine fracture (HCC)    LOS: 15 days   Additional comments:I reviewed the patient's new clinical lab test results. . Fall from height  R PTX- CT in place on water seal,150cc/24h, continue in the setting of diuresis Multiple B rib fxs- these are at the junction of the thoracic vertebral bodies, pain control, IS 3 column T8 vertebral body fx-perDr. Jones,non-op now, plan mobilize in brace once off vent. Needs CTO brace for HOB>30 degrees T7 neural arch fx- per nsgy R TVP T4-8 - pain control, per  nsgy R C7 TVP fx through foramen- C collar per Dr. Yetta Barre, CTAnegatvie Large scalp laceration/right eyebrow laceration- repair by Dr. Lovick4/05/2020, sutures removed4/11 Right neck abrasion/skin tear- bacitracin BID Facial ecchymosis- CT maxillofacialdemonstrates no facial bone fractures. Some anterior subluxation of the right mandibular condyle HTN- home lopressor 25mg  daily, restartedat half dose increase to full dose today CAD/H/O MI/DES- on ASA at home. Hold for now. 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 Poor weaning. On 60% and PEEP 8 Rash on back - benadryl Hypotension- resolved, off pressors Depression/anxiety- restart home bupropion whentaking PO, restarted home buspar BPH-home terazosin, failed ToV  again, increase bethanachol today and replace foley ABL anemia- stable FEN-TF,bowel regimen, lasix 40 BID today VTE-LMWH Dispo- ICU I feel he will benefit from trach/PEG. He is scheduled for 4/18. I will call his daughter again today to discuss further. Critical Care Total Time*: 34 Minutes  Violeta Gelinas, MD, MPH, FACS Trauma & General Surgery Use [email or web address] to contact on call provider  09/01/2020  *Care during the described time interval was provided by me. I have reviewed this patient's available data, including medical history, events of note, physical examination and test results as part of my evaluation.

## 2020-09-01 NOTE — Progress Notes (Signed)
Patient ID: Charles Weeks, male   DOB: June 23, 1965, 55 y.o.   MRN: [id number] I called his Daughter, Norris Brumbach and again discussed the planned trach/PEG on  Monday. I again discussed the procedures, risks, and benefits. She agrees and is in communication with other family members. Consent documented.  Violeta Gelinas, MD, MPH, FACS Please use [email or web address] to contact on call provider'

## 2020-09-02 LAB — CBC
HCT: 25.2 % — ABNORMAL LOW (ref 39.0–52.0)
Hemoglobin: 7.4 g/dL — ABNORMAL LOW (ref 13.0–17.0)
MCH: 30.8 pg (ref 26.0–34.0)
MCHC: 29.4 g/dL — ABNORMAL LOW (ref 30.0–36.0)
MCV: 105 fL — ABNORMAL HIGH (ref 80.0–100.0)
Platelets: 334 10*3/uL (ref 150–400)
RBC: 2.4 MIL/uL — ABNORMAL LOW (ref 4.22–5.81)
RDW: 17.7 % — ABNORMAL HIGH (ref 11.5–15.5)
WBC: 9.3 10*3/uL (ref 4.0–10.5)
nRBC: 0 % (ref 0.0–0.2)

## 2020-09-02 LAB — GLUCOSE, CAPILLARY
Glucose-Capillary: 142 mg/dL — ABNORMAL HIGH (ref 70–99)
Glucose-Capillary: 136 mg/dL — ABNORMAL HIGH (ref 70–99)
Glucose-Capillary: 144 mg/dL — ABNORMAL HIGH (ref 70–99)
Glucose-Capillary: 176 mg/dL — ABNORMAL HIGH (ref 70–99)
Glucose-Capillary: 114 mg/dL — ABNORMAL HIGH (ref 70–99)
Glucose-Capillary: 149 mg/dL — ABNORMAL HIGH (ref 70–99)

## 2020-09-02 LAB — BASIC METABOLIC PANEL
Anion gap: 7 (ref 5–15)
Anion gap: 7 (ref 5–15)
BUN: 29 mg/dL — ABNORMAL HIGH (ref 6–20)
BUN: 29 mg/dL — ABNORMAL HIGH (ref 6–20)
CO2: 41 mmol/L — ABNORMAL HIGH (ref 22–32)
CO2: 41 mmol/L — ABNORMAL HIGH (ref 22–32)
Calcium: 8.6 mg/dL — ABNORMAL LOW (ref 8.9–10.3)
Calcium: 8.5 mg/dL — ABNORMAL LOW (ref 8.9–10.3)
Chloride: 95 mmol/L — ABNORMAL LOW (ref 98–111)
Chloride: 93 mmol/L — ABNORMAL LOW (ref 98–111)
Creatinine, Ser: 0.82 mg/dL (ref 0.61–1.24)
Creatinine, Ser: 0.9 mg/dL (ref 0.61–1.24)
GFR, Estimated: >60 mL/min (ref >60)
GFR, Estimated: >60 mL/min (ref >60)
Glucose, Bld: 154 mg/dL — ABNORMAL HIGH (ref 70–99)
Glucose, Bld: 173 mg/dL — ABNORMAL HIGH (ref 70–99)
Potassium: 3.5 mmol/L (ref 3.5–5.1)
Potassium: 3.8 mmol/L (ref 3.5–5.1)
Sodium: 143 mmol/L (ref 135–145)
Sodium: 141 mmol/L (ref 135–145)

## 2020-09-02 LAB — TRIGLYCERIDES: Triglycerides: 115 mg/dL (ref <150)

## 2020-09-02 MED ORDER — FUROSEMIDE 10 MG/ML IJ SOLN
80.0000 mg | Freq: Four times a day (QID) | INTRAMUSCULAR | Status: AC
  Administered 2020-09-02 (×2): 80 mg via INTRAVENOUS
  Filled 2020-09-02 (×2): qty 8

## 2020-09-02 MED ORDER — POTASSIUM CHLORIDE 20 MEQ PO PACK
40.0000 meq | PACK | ORAL | Status: AC
  Administered 2020-09-02 (×2): 40 meq via ORAL
  Filled 2020-09-02 (×2): qty 2

## 2020-09-02 NOTE — Evaluation (Addendum)
Physical Therapy ROM Evaluation Patient Details Name: Charles Weeks MRN: [id number] DOB: [birth year] Today's Date: 09/02/2020   History of Present Illness  Pt is a 56 y.o. male who presented 4/1 after falling down about 11 stairs and sustained R PTX, anterior subluxation of R mandibular condyle, multiple bil ribs fxs at junction of thoracic vertebral bodies T5-8 on R and T7-8 on L, 3 column T8 vertebral body fx, T7 neural arch fx, R TVP T4-8 fxs, and R C7 TVP fx through foramen. R chest tube inserted 4/1. Pt found to be hypoxic and was intubated 4/2. No evidence of vascular injury to neck, facial fx, acute intracranial injury, lumbar spine fx, R elbow acute abnormality, or pelvic fx identified on imaging. PMH: HTN, COPD, CAD, s/p MI with DES okacenebt ub RCA in Jan 2020 just on ASA, depression/anxiety, and BPH.    Clinical Impression  Pt with orders for PT ROM evaluation as pt is not yet cleared to mobilize. Orders to keep flat, log roll, and maintain HOB < 30 degrees. Pt presents with condition above and deficits mentioned below, see PT Problem List. Pt orally intubated and sedated, opening eyes only briefly but not following commands this date. Thus, unsure of PLOF or living situation as no family present during eval. Pt is at high risk for pressure ulcers and decreased skin integrity and contractures due to his prolonged immobilization and limited AROM. Pt displays edema in his limbs, primarily distally, and in his scrotum and penis, which may be limiting his PROM into ankle dorsiflexion, knee flexion, wrist extension, and fingers extension bilaterally. Provided PROM to all limbs and retrograde edema massage to feet and hands and positioned pt with bil heels floating, hands elevated, and cloth roll in hands to decrease finger flexion at end of session to decrease risk for contractures and pressure ulcers. Placed order for bil hard PRAFOs and called in order to ortho tech. Will continue to follow  acutely. If pt is cleared for mobility he will need further PT assessment to evaluate need for follow-up PT and equipment.   Follow Up Recommendations CIR;Supervision/Assistance - 24 hour (may change once pt becomes mobile)    Equipment Recommendations  Other (comment) (TBD)    Recommendations for Other Services Rehab consult (once mobile)     Precautions / Restrictions Precautions Precautions: Cervical;Back Precaution Booklet Issued: No Precaution Comments: Bedrest, keep flat and log roll, R chest tube, orally intubated on vent, bil wrist restraints, bil mittens, HOB < 30 degrees, TLSO donned if HOB elevated, cervical collar at all times Required Braces or Orthoses: Cervical Brace;Spinal Brace Cervical Brace: Hard collar;At all times Spinal Brace: Thoracolumbosacral orthotic;Applied in supine position (donn when HOB raised, keep HOB < 30 degrees) Restrictions Weight Bearing Restrictions: No      Mobility  Bed Mobility               General bed mobility comments: Deferred due to bedrest orders and orders to keep flat and log roll only and keep HOB < 30 degrees    Transfers                 General transfer comment: Deferred due to bedrest orders and orders to keep flat and log roll only and keep HOB < 30 degrees  Ambulation/Gait             General Gait Details: Deferred due to bedrest orders and orders to keep flat and log roll only and keep HOB < 30 degrees  Stairs  Wheelchair Mobility    Modified Rankin (Stroke Patients Only)       Balance                                             Pertinent Vitals/Pain Pain Assessment: Faces Faces Pain Scale: Hurts even more Pain Location: generalized with PROM noted through grimacing and guarding Pain Descriptors / Indicators: Grimacing;Guarding Pain Intervention(s): Limited activity within patient's tolerance;Monitored during session;Repositioned    Home Living  Family/patient expects to be discharged to:: Unsure                 Additional Comments: Pt intubated orally and sedated not waking up long enough to follow commands or answer questions. No family present during eval.    Prior Function           Comments: Unsure, likely independent based on chart review. Pt intubated orally and sedated not waking up long enough to follow commands or answer questions. No family present during eval.     Hand Dominance        Extremity/Trunk Assessment   Upper Extremity Assessment Upper Extremity Assessment: RUE deficits/detail;LUE deficits/detail RUE Deficits / Details: Limited passive wrist and fingers extension PROM with noted edema in dorsal aspect of hand primarily; unable to follow commands to assess strength etc, but sponatenous movements/resistance noted in hands LUE Deficits / Details: Limited passive wrist and fingers extension PROM with noted edema in dorsal aspect of hand primarily; unable to follow commands to assess strength etc, but sponatenous movements/resistance noted in hands    Lower Extremity Assessment Lower Extremity Assessment: RLE deficits/detail;LLE deficits/detail RLE Deficits / Details: Noted edema throughout, esp in feet, and erythema medially (RN reports pt had a rash); limited PROM ankle dorsiflexion and knee flexion; unable to assess strength as pt not following commands this date, but sponatenous movements/resistance noted especially with PROM knee flexion and ankle dorsiflexion LLE Deficits / Details: Noted edema throughout, esp in feet, and erythema medially (RN reports pt had a rash); limited PROM ankle dorsiflexion and knee flexion; unable to assess strength as pt not following commands this date, but sponatenous movements/resistance noted especially with PROM knee flexion and ankle dorsiflexion    Cervical / Trunk Assessment Cervical / Trunk Assessment: Other exceptions Cervical / Trunk Exceptions: fxs with  TLSO and cervical brace  Communication   Communication: Other (comment) (orally intubated and sedated)  Cognition Arousal/Alertness: Lethargic;Suspect due to medications (requested RN hold fentanyl with pt opening eyes for brief periods, but not following commands) Behavior During Therapy: Flat affect Overall Cognitive Status: Difficult to assess                                 General Comments: Pt orally intubated and lethargic so unable to assess cognition. Pt unable to follow commands this date.      General Comments General comments (skin integrity, edema, etc.): Edema and wounds noted at scrotum and penis, which were left in elevation at end of session; Elevated hands on pillows end of session and lower legs on pillows with heels floating; Vent settings=PRVC, 40% conc, PEEP 8, SpO2 >/= 85% throughout    Exercises General Exercises - Upper Extremity Shoulder Flexion: PROM;Both;10 reps;Supine (<90 degrees) Shoulder Horizontal ABduction: PROM;Both;10 reps;Supine Shoulder Horizontal ADduction: PROM;Both;10 reps;Supine Elbow Flexion: PROM;Both;10 reps;Supine Elbow  Extension: PROM;Both;10 reps;Supine Wrist Flexion: PROM;Both;10 reps;Supine Wrist Extension: PROM;Both;10 reps;Supine Digit Composite Flexion: PROM;Both;10 reps;Supine Composite Extension: PROM;Both;10 reps;Supine Other Exercises Other Exercises: Retrograde edema massage performed to bil hands and feet Other Exercises: PROM bil UE pronation/supination 10x supine Other Exercises: PROM bil ankle dorsiflexion, plantarflexion, eversion, and inversion supine 10x Other Exercises: PROM bil knee and hip combo flexion supine 10x Other Exercises: PROM bil knee extension supine 10x   Assessment/Plan    PT Assessment Patient needs continued PT services  PT Problem List Decreased strength;Decreased range of motion;Decreased activity tolerance;Decreased mobility;Decreased knowledge of precautions;Decreased safety  awareness;Decreased skin integrity       PT Treatment Interventions Therapeutic exercise;DME instruction;Gait training;Functional mobility training;Therapeutic activities;Balance training;Neuromuscular re-education;Patient/family education;Cognitive remediation;Stair training;Manual techniques    PT Goals (Current goals can be found in the Care Plan section)  Acute Rehab PT Goals Patient Stated Goal: did not state PT Goal Formulation: Patient unable to participate in goal setting Time For Goal Achievement: 09/16/20 Potential to Achieve Goals: Fair Additional Goals Additional Goal #1: Pt will achieve and maintain >/= 20 degrees bil ankle dorsiflexion PROM. Additional Goal #2: Pt will be able to perform AROM of bil wrists and fingers into extension WNL.    Frequency Min 2X/week   Barriers to discharge        Co-evaluation               AM-PAC PT "6 Clicks" Mobility  Outcome Measure Help needed turning from your back to your side while in a flat bed without using bedrails?: Total Help needed moving from lying on your back to sitting on the side of a flat bed without using bedrails?: Total Help needed moving to and from a bed to a chair (including a wheelchair)?: Total Help needed standing up from a chair using your arms (e.g., wheelchair or bedside chair)?: Total Help needed to walk in hospital room?: Total Help needed climbing 3-5 steps with a railing? : Total 6 Click Score: 6    End of Session Equipment Utilized During Treatment: Cervical collar;Back brace;Oxygen Activity Tolerance: Patient limited by lethargy Patient left: in bed;with call bell/phone within reach;with restraints reapplied;with SCD's reapplied Nurse Communication: Mobility status;Other (comment) (level of arousal, positioning, need for PRAFOs) PT Visit Diagnosis: Muscle weakness (generalized) (M62.81);Difficulty in walking, not elsewhere classified (R26.2)    Time: 1439-1510 PT Time Calculation (min)  (ACUTE ONLY): 31 min   Charges:   PT Evaluation $PT Eval Moderate Complexity: 1 Mod PT Treatments $Therapeutic Exercise: 8-22 mins        Raymond Gurney, PT, DPT Acute Rehabilitation Services  Pager: [phone] Office: [phone]   Jewel Baize 09/02/2020, 4:04 PM

## 2020-09-02 NOTE — Progress Notes (Signed)
Trauma/Critical Care Follow Up Note  Subjective:    Overnight Issues:   Objective:  Vital signs for last 24 hours: Temp:  [98.5 F (36.9 C)-100.3 F (37.9 C)] 99.7 F (37.6 C) (04/17 0800) Pulse Rate:  [91-130] 106 (04/17 0800) Resp:  [13-22] 18 (04/17 0800) BP: (110-154)/(62-96) 140/69 (04/17 0800) SpO2:  [88 %-96 %] 92 % (04/17 0800) FiO2 (%):  [50 %] 50 % (04/17 0400) Weight:  [97.5 kg] 97.5 kg (04/17 0400)  Hemodynamic parameters for last 24 hours:    Intake/Output from previous day: 04/16 0701 - 04/17 0700 In: 2782.9 [I.V.:142.9; NG/GT:2640] Out: 3330 [Urine:3130; Chest Tube:200]  Intake/Output this shift: Total I/O In: 63.6 [I.V.:3.6; NG/GT:60] Out: -   Vent settings for last 24 hours: Vent Mode: PRVC FiO2 (%):  [50 %] 50 % Set Rate:  [18 bmp] 18 bmp Vt Set:  [480 mL] 480 mL PEEP:  [8 cmH20] 8 cmH20 Plateau Pressure:  [16 cmH20-25 cmH20] 16 cmH20  Physical Exam:  Gen: comfortable, no distress Neuro: f/c HEENT: PERRL Neck: c-collar CV: RRR Pulm: unlabored breathing on MV Abd: soft, NT GU: clear yellow urine Extr: wwp, no edema   Results for orders placed or performed during the hospital encounter of 08/17/20 (from the past 24 hour(s))  Glucose, capillary     Status: Abnormal   Collection Time: 09/01/20 11:06 AM  Result Value Ref Range   Glucose-Capillary 136 (H) 70 - 99 mg/dL  Glucose, capillary     Status: Abnormal   Collection Time: 09/01/20  3:16 PM  Result Value Ref Range   Glucose-Capillary 144 (H) 70 - 99 mg/dL  Glucose, capillary     Status: Abnormal   Collection Time: 09/01/20  7:39 PM  Result Value Ref Range   Glucose-Capillary 150 (H) 70 - 99 mg/dL  Glucose, capillary     Status: Abnormal   Collection Time: 09/01/20 11:29 PM  Result Value Ref Range   Glucose-Capillary 121 (H) 70 - 99 mg/dL  Glucose, capillary     Status: Abnormal   Collection Time: 09/02/20  3:39 AM  Result Value Ref Range   Glucose-Capillary 142 (H) 70 - 99  mg/dL  Triglycerides     Status: None   Collection Time: 09/02/20  4:45 AM  Result Value Ref Range   Triglycerides 115 <150 mg/dL  CBC     Status: Abnormal   Collection Time: 09/02/20  4:45 AM  Result Value Ref Range   WBC 9.3 4.0 - 10.5 K/uL   RBC 2.40 (L) 4.22 - 5.81 MIL/uL   Hemoglobin 7.4 (L) 13.0 - 17.0 g/dL   HCT 76.1 (L) 95.0 - 93.2 %   MCV 105.0 (H) 80.0 - 100.0 fL   MCH 30.8 26.0 - 34.0 pg   MCHC 29.4 (L) 30.0 - 36.0 g/dL   RDW 67.1 (H) 24.5 - 80.9 %   Platelets 334 150 - 400 K/uL   nRBC 0.0 0.0 - 0.2 %  Basic metabolic panel     Status: Abnormal   Collection Time: 09/02/20  4:45 AM  Result Value Ref Range   Sodium 143 135 - 145 mmol/L   Potassium 3.5 3.5 - 5.1 mmol/L   Chloride 95 (L) 98 - 111 mmol/L   CO2 41 (H) 22 - 32 mmol/L   Glucose, Bld 154 (H) 70 - 99 mg/dL   BUN 29 (H) 6 - 20 mg/dL   Creatinine, Ser 9.83 0.61 - 1.24 mg/dL   Calcium 8.6 (L) 8.9 - 10.3  mg/dL   GFR, Estimated >27 >03 mL/min   Anion gap 7 5 - 15  Glucose, capillary     Status: Abnormal   Collection Time: 09/02/20  7:51 AM  Result Value Ref Range   Glucose-Capillary 136 (H) 70 - 99 mg/dL    Assessment & Plan: The plan of care was discussed with the bedside nurse for the day, Tresa Endo, who is in agreement with this plan and no additional concerns were raised.   Present on Admission: . Thoracic spine fracture (HCC)    LOS: 16 days   Additional comments:I reviewed the patient's new clinical lab test results.   and I reviewed the patients new imaging test results.    Fall from height  R PTX- CT in place on water seal,200cc/24h, continue in the setting of diuresis Multiple B rib fxs- these are at the junction of the thoracic vertebral bodies, pain control, IS 3 column T8 vertebral body fx-perDr. Jones,non-op now, plan mobilize in brace once off vent. Needs CTO brace for HOB>30 degrees T7 neural arch fx- per nsgy R TVP T4-8 - pain control, per nsgy R C7 TVP fx through foramen- C  collar per Dr. Yetta Barre, CTAnegatvie Large scalp laceration/right eyebrow laceration- repair by Dr. Lovick4/05/2020, sutures removed4/11 Right neck abrasion/skin tear- bacitracin BID Facial ecchymosis- CT maxillofacialdemonstrates no facial bone fractures. Some anterior subluxation of the right mandibular condyle HTN- home lopressor 25mg  daily CAD/H/O MI/DES- on ASA at home. Hold for now. 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 Poor weaning. On 50% and PEEP 8, PSV trials as tolerated. Trach/PEG 4/18. Rash on back- benadryl Hypotension- resolved, off pressors Depression/anxiety- restart home bupropion whentaking PO, restarted home buspar BPH-home terazosin, failed ToV, increased bethanachol 4/15. D/c foley again today. ABL anemia- stable FEN-TF,bowel regimen, lasix 80 BID today VTE-[facility] Dispo- ICU  Critical Care Total Time: 35 minutes  5/15, MD Trauma & General Surgery Please use [email or web address] to contact on call provider  09/02/2020  *Care during the described time interval was provided by me. I have reviewed this patient's available data, including medical history, events of note, physical examination and test results as part of my evaluation.

## 2020-09-02 NOTE — Anesthesia Preprocedure Evaluation (Addendum)
Anesthesia Evaluation  Patient identified by MRN, date of birth, ID band Patient awake    Reviewed: Allergy & Precautions, NPO status , Patient's Chart, lab work & pertinent test results  Airway Mallampati: Intubated  TM Distance: >3 FB Neck ROM: Full    Dental  (+) Dental Advisory Given   Pulmonary COPD, Current Smoker,  Hypoxic respiratory failure Bilateral rib fx's   breath sounds clear to auscultation + decreased breath sounds      Cardiovascular hypertension, + CAD   Rhythm:Regular Rate:Normal     Neuro/Psych Multiple spine fx's    GI/Hepatic negative GI ROS, Neg liver ROS,   Endo/Other  negative endocrine ROS  Renal/GU negative Renal ROS     Musculoskeletal   Abdominal   Peds  Hematology  (+) anemia ,   Anesthesia Other Findings   Reproductive/Obstetrics                            Lab Results  Component Value Date   WBC 9.3 09/02/2020   HGB 7.4 (L) 09/02/2020   HCT 25.2 (L) 09/02/2020   MCV 105.0 (H) 09/02/2020   PLT 334 09/02/2020   Lab Results  Component Value Date   CREATININE 0.90 09/02/2020   BUN 29 (H) 09/02/2020   NA 141 09/02/2020   K 3.8 09/02/2020   CL 93 (L) 09/02/2020   CO2 41 (H) 09/02/2020    Anesthesia Physical Anesthesia Plan  ASA: IV  Anesthesia Plan: General   Post-op Pain Management:    Induction: Intravenous  PONV Risk Score and Plan: 1 and Dexamethasone, Ondansetron and Treatment may vary due to age or medical condition  Airway Management Planned: Oral ETT  Additional Equipment: None  Intra-op Plan:   Post-operative Plan: Post-operative intubation/ventilation  Informed Consent: I have reviewed the patients History and Physical, chart, labs and discussed the procedure including the risks, benefits and alternatives for the proposed anesthesia with the patient or authorized representative who has indicated his/her understanding and  acceptance.     Dental advisory given  Plan Discussed with: CRNA  Anesthesia Plan Comments:        Anesthesia Quick Evaluation

## 2020-09-02 NOTE — Progress Notes (Signed)
Orthopedic Tech Progress Note Patient Details:  Linc Renne 01/18/1966 [id number] Ordered Bilateral Hard PRAFOs  Patient ID: Coury Grieger, male   DOB: November 22, 1965, 55 y.o.   MRN: [id number]   Gerald Stabs 09/02/2020, 4:20 PM

## 2020-09-02 NOTE — Progress Notes (Signed)
Foley cath discontinued at 1200. No UO via external cath. IV Lasix 80mg  q6 as ordered.  Bladder scan reveals approx 700cc Patient straight cathed with 600cc urine return.

## 2020-09-02 NOTE — Progress Notes (Signed)
Inpatient Rehab Admissions Coordinator Note:   Per therapy recommendations, pt was screened for CIR candidacy by Megan Salon, MS CCC-SLP. Note Pt.'s PT eval very limited due to bedrest orders. Pt. Does not appear ready for CIR at this time, but [facility] team will follow progress with therapies and place order for CIR consult if/when Pt. Becomes an appropriate candidate.  Please contact me with questions.   Megan Salon, MS, CCC-SLP Rehab Admissions Coordinator  [phone] (celll) [phone] (office)

## 2020-09-03 ENCOUNTER — Inpatient Hospital Stay (HOSPITAL_COMMUNITY): Payer: Medicaid Other | Admitting: Anesthesiology

## 2020-09-03 ENCOUNTER — Encounter (HOSPITAL_COMMUNITY): Admission: EM | Disposition: A | Discharge status: Rehabilitation Facility | Payer: Self-pay | Source: Home / Self Care

## 2020-09-03 ENCOUNTER — Inpatient Hospital Stay (HOSPITAL_COMMUNITY): Payer: Medicaid Other

## 2020-09-03 ENCOUNTER — Encounter (HOSPITAL_COMMUNITY): Payer: Self-pay

## 2020-09-03 HISTORY — PX: PERCUTANEOUS TRACHEOSTOMY: SHX5288

## 2020-09-03 HISTORY — PX: PEG PLACEMENT: SHX5437

## 2020-09-03 LAB — CBC
HCT: 26.3 % — ABNORMAL LOW (ref 39.0–52.0)
Hemoglobin: 7.8 g/dL — ABNORMAL LOW (ref 13.0–17.0)
MCH: 30.7 pg (ref 26.0–34.0)
MCHC: 29.7 g/dL — ABNORMAL LOW (ref 30.0–36.0)
MCV: 103.5 fL — ABNORMAL HIGH (ref 80.0–100.0)
Platelets: 359 10*3/uL (ref 150–400)
RBC: 2.54 MIL/uL — ABNORMAL LOW (ref 4.22–5.81)
RDW: 17.2 % — ABNORMAL HIGH (ref 11.5–15.5)
WBC: 8.9 10*3/uL (ref 4.0–10.5)
nRBC: 0 % (ref 0.0–0.2)

## 2020-09-03 LAB — GLUCOSE, CAPILLARY
Glucose-Capillary: 110 mg/dL — ABNORMAL HIGH (ref 70–99)
Glucose-Capillary: 111 mg/dL — ABNORMAL HIGH (ref 70–99)
Glucose-Capillary: 114 mg/dL — ABNORMAL HIGH (ref 70–99)
Glucose-Capillary: 117 mg/dL — ABNORMAL HIGH (ref 70–99)
Glucose-Capillary: 126 mg/dL — ABNORMAL HIGH (ref 70–99)
Glucose-Capillary: 150 mg/dL — ABNORMAL HIGH (ref 70–99)

## 2020-09-03 LAB — BASIC METABOLIC PANEL
Anion gap: 8 (ref 5–15)
BUN: 29 mg/dL — ABNORMAL HIGH (ref 6–20)
CO2: 40 mmol/L — ABNORMAL HIGH (ref 22–32)
Calcium: 8.2 mg/dL — ABNORMAL LOW (ref 8.9–10.3)
Chloride: 95 mmol/L — ABNORMAL LOW (ref 98–111)
Creatinine, Ser: 0.74 mg/dL (ref 0.61–1.24)
GFR, Estimated: >60 mL/min (ref >60)
Glucose, Bld: 118 mg/dL — ABNORMAL HIGH (ref 70–99)
Potassium: 3.4 mmol/L — ABNORMAL LOW (ref 3.5–5.1)
Sodium: 143 mmol/L (ref 135–145)

## 2020-09-03 SURGERY — CREATION, TRACHEOSTOMY, PERCUTANEOUS
Anesthesia: General

## 2020-09-03 SURGERY — EGD (ESOPHAGOGASTRODUODENOSCOPY)
Anesthesia: Moderate Sedation

## 2020-09-03 MED ORDER — POTASSIUM CHLORIDE 10 MEQ/50ML IV SOLN
10.0000 meq | INTRAVENOUS | Status: AC
  Administered 2020-09-03 (×2): 10 meq via INTRAVENOUS
  Filled 2020-09-03 (×2): qty 50

## 2020-09-03 MED ORDER — 0.9 % SODIUM CHLORIDE (POUR BTL) OPTIME
TOPICAL | Status: DC | PRN
  Administered 2020-09-03: 1000 mL

## 2020-09-03 MED ORDER — LACTATED RINGERS IV SOLN: INTRAVENOUS | Status: DC | PRN

## 2020-09-03 MED ORDER — ALBUMIN HUMAN 5 % IV SOLN
12.5000 g | Freq: Once | INTRAVENOUS | Status: AC
  Administered 2020-09-03: 12.5 g via INTRAVENOUS
  Filled 2020-09-03: qty 250

## 2020-09-03 MED ORDER — CEFAZOLIN SODIUM-DEXTROSE 2-3 GM-%(50ML) IV SOLR
INTRAVENOUS | Status: DC | PRN
  Administered 2020-09-03: 2 g via INTRAVENOUS

## 2020-09-03 MED ORDER — FENTANYL CITRATE (PF) 250 MCG/5ML IJ SOLN
INTRAMUSCULAR | Status: AC
  Filled 2020-09-03: qty 5

## 2020-09-03 MED ORDER — PROPOFOL 10 MG/ML IV BOLUS
INTRAVENOUS | Status: AC
  Filled 2020-09-03: qty 20

## 2020-09-03 MED ORDER — LIDOCAINE-EPINEPHRINE (PF) 1.5 %-1:200000 IJ SOLN
INTRAMUSCULAR | Status: DC | PRN
  Administered 2020-09-03: 8 mL

## 2020-09-03 MED ORDER — FENTANYL CITRATE (PF) 250 MCG/5ML IJ SOLN
INTRAMUSCULAR | Status: DC | PRN
  Administered 2020-09-03: 100 ug via INTRAVENOUS

## 2020-09-03 MED ORDER — ROCURONIUM BROMIDE 10 MG/ML (PF) SYRINGE
PREFILLED_SYRINGE | INTRAVENOUS | Status: AC
  Filled 2020-09-03: qty 10

## 2020-09-03 MED ORDER — MIDAZOLAM HCL 2 MG/2ML IJ SOLN
INTRAMUSCULAR | Status: DC | PRN
  Administered 2020-09-03 (×2): 2 mg via INTRAVENOUS

## 2020-09-03 MED ORDER — PIVOT 1.5 CAL PO LIQD
1000.0000 mL | ORAL | Status: DC
  Administered 2020-09-03 – 2020-09-30 (×25): 1000 mL
  Filled 2020-09-03 (×13): qty 1000

## 2020-09-03 MED ORDER — MIDAZOLAM HCL 2 MG/2ML IJ SOLN
INTRAMUSCULAR | Status: AC
  Filled 2020-09-03: qty 4

## 2020-09-03 MED ORDER — ROCURONIUM BROMIDE 10 MG/ML (PF) SYRINGE
PREFILLED_SYRINGE | INTRAVENOUS | Status: DC | PRN
  Administered 2020-09-03: 100 mg via INTRAVENOUS

## 2020-09-03 MED ORDER — PROPOFOL 10 MG/ML IV BOLUS
INTRAVENOUS | Status: DC | PRN
  Administered 2020-09-03: 30 mg via INTRAVENOUS

## 2020-09-03 SURGICAL SUPPLY — 35 items
BINDER ABDOMINAL 12 ML 46-62 (SOFTGOODS) ×2 IMPLANT
BNDG CMPR MD 5X2 ELC HKLP STRL (GAUZE/BANDAGES/DRESSINGS) ×1
BNDG ELASTIC 2 VLCR STRL LF (GAUZE/BANDAGES/DRESSINGS) ×2 IMPLANT
DRAPE HALF SHEET 40X57 (DRAPES) ×8 IMPLANT
DRAPE UTILITY XL STRL (DRAPES) ×2 IMPLANT
DRESSING ALLEVYN 2X4 GNTL LT (GAUZE/BANDAGES/DRESSINGS) ×2 IMPLANT
ELECT CAUTERY BLADE 6.4 (BLADE) ×2 IMPLANT
ELECT REM PT RETURN 9FT ADLT (ELECTROSURGICAL) ×2
ELECTRODE REM PT RTRN 9FT ADLT (ELECTROSURGICAL) ×1 IMPLANT
GAUZE 4X4 16PLY RFD (DISPOSABLE) ×2 IMPLANT
GLOVE BIO SURGEON STRL SZ 6 (GLOVE) IMPLANT
GLOVE BIO SURGEON STRL SZ8 (GLOVE) ×2 IMPLANT
GLOVE SRG 8 PF TXTR STRL LF DI (GLOVE) ×1 IMPLANT
GLOVE SURG UNDER POLY LF SZ6.5 (GLOVE) IMPLANT
GLOVE SURG UNDER POLY LF SZ8 (GLOVE) ×2
GOWN STRL REUS W/ TWL LRG LVL3 (GOWN DISPOSABLE) ×2 IMPLANT
GOWN STRL REUS W/ TWL XL LVL3 (GOWN DISPOSABLE) ×1 IMPLANT
GOWN STRL REUS W/TWL LRG LVL3 (GOWN DISPOSABLE) ×4
GOWN STRL REUS W/TWL XL LVL3 (GOWN DISPOSABLE) ×2
INTRODUCER TRACH BLUE RHINO 6F (TUBING) IMPLANT
INTRODUCER TRACH BLUE RHINO 8F (TUBING) IMPLANT
PENCIL BUTTON HOLSTER BLD 10FT (ELECTRODE) ×2 IMPLANT
SLEEVE SCD COMPRESS KNEE MED (STOCKING) ×2 IMPLANT
SPONGE DRAIN TRACH 4X4 STRL 2S (GAUZE/BANDAGES/DRESSINGS) ×2 IMPLANT
SPONGE INTESTINAL PEANUT (DISPOSABLE) ×2 IMPLANT
SUT SILK 3 0 SH CR/8 (SUTURE) ×2 IMPLANT
SUT VICRYL AB 3 0 TIES (SUTURE) ×2 IMPLANT
SYR BULB IRRIG 60ML STRL (SYRINGE) ×2 IMPLANT
SYR CONTROL 10ML LL (SYRINGE) ×2 IMPLANT
TOWEL GREEN STERILE FF (TOWEL DISPOSABLE) ×2 IMPLANT
TRAY W/TRACH TUBE 7.5 (MISCELLANEOUS) ×2 IMPLANT
TUBE CONNECTING 12X1/4 (SUCTIONS) ×2 IMPLANT
TUBE TRACH  6.0 CUFF FLEX (MISCELLANEOUS) ×1
TUBE TRACH 6.0 CUFF FLEX (MISCELLANEOUS) ×1 IMPLANT
YANKAUER SUCT BULB TIP NO VENT (SUCTIONS) ×2 IMPLANT

## 2020-09-03 NOTE — Progress Notes (Signed)
Follow up - Trauma Critical Care  Patient Details:    Charles Weeks is an 55 y.o. male.  Lines/tubes : Airway 7.5 mm (Active)  Secured at (cm) 26 cm 09/03/20 0400  Measured From Lips 09/03/20 0400  Secured Location Center 09/03/20 0400  Secured By [employer] 09/03/20 0400  Tube Holder Repositioned Yes 09/03/20 0331  Prone position No 09/02/20 2000  Cuff Pressure (cm H2O) Green OR 18-26 CmH2O 09/01/20 1519  Site Condition Dry 09/02/20 2000     CVC Triple Lumen 08/18/20 Right Subclavian (Active)  Indication for Insertion or Continuance of Line Prolonged intravenous therapies 09/02/20 2300  Site Assessment Clean;Dry;Intact 09/03/20 0400  Proximal Lumen Status Infusing 09/03/20 0400  Medial Lumen Status Saline locked 09/03/20 0400  Distal Lumen Status In-line blood sampling system in place 09/03/20 0400  Dressing Type Transparent;Occlusive 09/03/20 0400  Dressing Status Clean;Dry;Intact 09/03/20 0400  Antimicrobial disc in place? Yes 09/02/20 2000  Line Care Connections checked and tightened 09/02/20 2000  Dressing Intervention Dressing changed 09/02/20 2300  Dressing Change Due 09/09/20 09/02/20 2300     Chest Tube Lateral;Right (Active)  Status To water seal 09/03/20 0400  Chest Tube Air Leak None 09/03/20 0400  Patency Intervention Tip/tilt 09/03/20 0400  Drainage Description Serosanguineous 09/03/20 0400  Dressing Status Clean;Dry;Intact 09/03/20 0400  Dressing Intervention Other (Comment) 09/02/20 2000  Site Assessment Clean;Dry;Intact 09/03/20 0400  Surrounding Skin Unable to view 09/03/20 0400  Output (mL) 0 mL 09/03/20 0400     NG/OG Tube Orogastric Center mouth Xray (Active)  External Length of Tube (cm) - (if applicable) 60.5 cm 09/02/20 2000  Site Assessment Clean;Dry;Intact 09/02/20 2000  Ongoing Placement Verification No change in cm markings or external length of tube from initial placement 09/02/20 2000  Status Infusing tube feed 09/02/20 2000   Drainage Appearance None 09/02/20 2000  Intake (mL) 100 mL 08/30/20 1030  Output (mL) 0 mL 08/29/20 1800     Urethral Catheter Rosemarie RN Double-lumen 16 Fr. (Active)  Output (mL) 200 mL 09/03/20 0400    Microbiology/Sepsis markers: Results for orders placed or performed during the hospital encounter of 08/17/20  Resp Panel by RT-PCR (Flu A&B, Covid) Nasopharyngeal Swab     Status: None   Collection Time: 08/17/20  3:40 PM   Specimen: Nasopharyngeal Swab; Nasopharyngeal(NP) swabs in vial transport medium  Result Value Ref Range Status   SARS Coronavirus 2 by RT PCR NEGATIVE NEGATIVE Final    Comment: (NOTE) SARS-CoV-2 target nucleic acids are NOT DETECTED.  The SARS-CoV-2 RNA is generally detectable in upper respiratory specimens during the acute phase of infection. The lowest concentration of SARS-CoV-2 viral copies this assay can detect is 138 copies/mL. A negative result does not preclude SARS-Cov-2 infection and should not be used as the sole basis for treatment or other patient management decisions. A negative result may occur with  improper specimen collection/handling, submission of specimen other than nasopharyngeal swab, presence of viral mutation(s) within the areas targeted by this assay, and inadequate number of viral copies(<138 copies/mL). A negative result must be combined with clinical observations, patient history, and epidemiological information. The expected result is Negative.  Fact Sheet for Patients:  BloggerCourse.com  Fact Sheet for Healthcare Providers:  SeriousBroker.it  This test is no t yet approved or cleared by the [place] FDA and  has been authorized for detection and/or diagnosis of SARS-CoV-2 by FDA under an Emergency Use Authorization (EUA). This EUA will remain  in effect (meaning this test can be  used) for the duration of the COVID-19 declaration under Section 564(b)(1) of the Act,  21 U.S.C.section 360bbb-3(b)(1), unless the authorization is terminated  or revoked sooner.       Influenza A by PCR NEGATIVE NEGATIVE Final   Influenza B by PCR NEGATIVE NEGATIVE Final    Comment: (NOTE) The Xpert Xpress SARS-CoV-2/FLU/RSV plus assay is intended as an aid in the diagnosis of influenza from Nasopharyngeal swab specimens and should not be used as a sole basis for treatment. Nasal washings and aspirates are unacceptable for Xpert Xpress SARS-CoV-2/FLU/RSV testing.  Fact Sheet for Patients: BloggerCourse.com  Fact Sheet for Healthcare Providers: SeriousBroker.it  This test is not yet approved or cleared by the [place] FDA and has been authorized for detection and/or diagnosis of SARS-CoV-2 by FDA under an Emergency Use Authorization (EUA). This EUA will remain in effect (meaning this test can be used) for the duration of the COVID-19 declaration under Section 564(b)(1) of the Act, 21 U.S.C. section 360bbb-3(b)(1), unless the authorization is terminated or revoked.  Performed at [facility], [address] [place], [place] [address]   Surgical PCR screen     Status: None   Collection Time: 08/19/20  7:38 AM   Specimen: Nasal Mucosa; Nasal Swab  Result Value Ref Range Status   MRSA, PCR NEGATIVE NEGATIVE Final   Staphylococcus aureus NEGATIVE NEGATIVE Final    Comment: (NOTE) The Xpert SA Assay (FDA approved for NASAL specimens in patients 60 years of age and older), is one component of a comprehensive surveillance program. It is not intended to diagnose infection nor to guide or monitor treatment. Performed at [facility], [address] [place], [place] [address]     Anti-infectives:  Anti-infectives (From admission, onward)   Start     Dose/Rate Route Frequency Ordered Stop   08/19/20 0845  cefTRIAXone (ROCEPHIN) 2 g in sodium chloride 0.9 % 100 mL IVPB  Status:  Discontinued         2 g 200 mL/hr over 30 Minutes Intravenous Every 24 hours 08/19/20 0753 08/25/20 1304   08/17/20 1515  ceFAZolin (ANCEF) IVPB 2g/100 mL premix        2 g 200 mL/hr over 30 Minutes Intravenous  Once 08/17/20 1500 08/17/20 1530      Best Practice/Protocols:  VTE Prophylaxis: Lovenox (prophylaxtic dose) Continous Sedation  Consults: Treatment Team:  Tia Alert, MD    Studies:    Events:  Subjective:    Overnight Issues:   Objective:  Vital signs for last 24 hours: Temp:  [98.8 F (37.1 C)-99.7 F (37.6 C)] 99 F (37.2 C) (04/18 0400) Pulse Rate:  [90-127] 99 (04/18 0500) Resp:  [13-20] 18 (04/18 0500) BP: (103-160)/(63-87) 121/75 (04/18 0500) SpO2:  [86 %-96 %] 94 % (04/18 0500) FiO2 (%):  [40 %-50 %] 50 % (04/18 0400) Weight:  [100.6 kg] 100.6 kg (04/18 0423)  Hemodynamic parameters for last 24 hours:    Intake/Output from previous day: 04/17 0701 - 04/18 0700 In: 1526.2 [I.V.:226.2; NG/GT:1300] Out: 3210 [Urine:3100; Chest Tube:110]  Intake/Output this shift: Total I/O In: 393.1 [I.V.:93.1; NG/GT:300] Out: 1450 [Urine:1450]  Vent settings for last 24 hours: Vent Mode: PRVC FiO2 (%):  [40 %-50 %] 50 % Set Rate:  [18 bmp] 18 bmp Vt Set:  [480 mL] 480 mL PEEP:  [5 cmH20-8 cmH20] 8 cmH20 Plateau Pressure:  [13 cmH20-23 cmH20] 17 cmH20  Physical Exam:  General: on vent Neuro: sedated HEENT/Neck: collar Resp:  clear to auscultation bilaterally CVS: RRR GI: soft, NT Extremities: edema 2+  Results for orders placed or performed during the hospital encounter of 08/17/20 (from the past 24 hour(s))  Glucose, capillary     Status: Abnormal   Collection Time: 09/02/20  7:51 AM  Result Value Ref Range   Glucose-Capillary 136 (H) 70 - 99 mg/dL  Glucose, capillary     Status: Abnormal   Collection Time: 09/02/20 11:48 AM  Result Value Ref Range   Glucose-Capillary 144 (H) 70 - 99 mg/dL  Glucose, capillary     Status: Abnormal   Collection Time:  09/02/20  4:00 PM  Result Value Ref Range   Glucose-Capillary 176 (H) 70 - 99 mg/dL  Basic metabolic panel     Status: Abnormal   Collection Time: 09/02/20  4:11 PM  Result Value Ref Range   Sodium 141 135 - 145 mmol/L   Potassium 3.8 3.5 - 5.1 mmol/L   Chloride 93 (L) 98 - 111 mmol/L   CO2 41 (H) 22 - 32 mmol/L   Glucose, Bld 173 (H) 70 - 99 mg/dL   BUN 29 (H) 6 - 20 mg/dL   Creatinine, Ser 6.71 0.61 - 1.24 mg/dL   Calcium 8.5 (L) 8.9 - 10.3 mg/dL   GFR, Estimated >24 >58 mL/min   Anion gap 7 5 - 15  Glucose, capillary     Status: Abnormal   Collection Time: 09/02/20  7:33 PM  Result Value Ref Range   Glucose-Capillary 114 (H) 70 - 99 mg/dL  Glucose, capillary     Status: Abnormal   Collection Time: 09/02/20 11:20 PM  Result Value Ref Range   Glucose-Capillary 149 (H) 70 - 99 mg/dL  Glucose, capillary     Status: Abnormal   Collection Time: 09/03/20  3:47 AM  Result Value Ref Range   Glucose-Capillary 117 (H) 70 - 99 mg/dL  CBC     Status: Abnormal   Collection Time: 09/03/20  4:57 AM  Result Value Ref Range   WBC 8.9 4.0 - 10.5 K/uL   RBC 2.54 (L) 4.22 - 5.81 MIL/uL   Hemoglobin 7.8 (L) 13.0 - 17.0 g/dL   HCT 09.9 (L) 83.3 - 82.5 %   MCV 103.5 (H) 80.0 - 100.0 fL   MCH 30.7 26.0 - 34.0 pg   MCHC 29.7 (L) 30.0 - 36.0 g/dL   RDW 05.3 (H) 97.6 - 73.4 %   Platelets 359 150 - 400 K/uL   nRBC 0.0 0.0 - 0.2 %  Basic metabolic panel     Status: Abnormal   Collection Time: 09/03/20  4:57 AM  Result Value Ref Range   Sodium 143 135 - 145 mmol/L   Potassium 3.4 (L) 3.5 - 5.1 mmol/L   Chloride 95 (L) 98 - 111 mmol/L   CO2 40 (H) 22 - 32 mmol/L   Glucose, Bld 118 (H) 70 - 99 mg/dL   BUN 29 (H) 6 - 20 mg/dL   Creatinine, Ser 1.93 0.61 - 1.24 mg/dL   Calcium 8.2 (L) 8.9 - 10.3 mg/dL   GFR, Estimated >79 >02 mL/min   Anion gap 8 5 - 15    Assessment & Plan: Present on Admission: . Thoracic spine fracture (HCC)    LOS: 17 days   Additional comments:I reviewed the  patient's new clinical lab test results. . Fall from height  R PTX- CT in place on water seal,110cc/24h, continue today Multiple B rib fxs- these are at the junction of the  thoracic vertebral bodies, pain control, IS 3 column T8 vertebral body fx-perDr. Jones,non-op now, plan mobilize in brace once off vent. Needs CTO brace for HOB>30 degrees T7 neural arch fx- per nsgy R TVP T4-8 - pain control, per nsgy R C7 TVP fx through foramen- C collar per Dr. Yetta Barre, CTAnegatvie Large scalp laceration/right eyebrow laceration- repair by Dr. Lovick4/05/2020, sutures removed4/11 Right neck abrasion/skin tear- bacitracin BID Facial ecchymosis- CT maxillofacialdemonstrates no facial bone fractures. Some anterior subluxation of the right mandibular condyle HTN- home lopressor 25mg  daily CAD/H/O MI/DES- on ASA at home. Hold for now. 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 Poor weaning. On 50% and PEEP 8, PSV trials as tolerated. Trach/PEG 4/18. Rash on back- benadryl Hypotension- resolved, off pressors Depression/anxiety- restart home bupropion whentaking PO, restarted home buspar BPH-home terazosin, failed ToV, increased bethanachol 4/15. Failed voiding trial 4/17 and foley back in  ABL anemia- stable FEN-TF,bowel regimen, lasix 80 BID 4/17 and he has diuresed pretty well VTE-[facility] Dispo- ICU Trach/PEG in the OR this AM. Consent documented. I spoke with his daughter again 4/16. Family in agreement. Critical Care Total Time*: 34 Minutes  Violeta Gelinas, MD, MPH, FACS Trauma & General Surgery Use [email or web address] to contact on call provider  09/03/2020  *Care during the described time interval was provided by me. I have reviewed this patient's available data, including medical history, events of note, physical examination  and test results as part of my evaluation.  Patient ID: Charles Weeks, male   DOB: Sep 02, 1965, 55 y.o.   MRN: [id number]

## 2020-09-03 NOTE — Progress Notes (Signed)
Spoke with DR Bedelia Person on the phone, referred pt, with total UO=100 ml from 2000H till now, bladder scan done with urine retention=667ml, last bladder scan and intermittent cath was done at 1600H, Dr Bedelia Person telephone order to insert foley catheter

## 2020-09-03 NOTE — Anesthesia Postprocedure Evaluation (Signed)
Anesthesia Post Note  Patient: Holbert Caples  Procedure(s) Performed: PERCUTANEOUS TRACHEOSTOMY USING 6 SHILEY (N/A ) PERCUTANEOUS ENDOSCOPIC GASTROSTOMY (PEG) PLACEMENT (N/A )     Patient location during evaluation: SICU Anesthesia Type: General Level of consciousness: sedated Pain management: pain level controlled Vital Signs Assessment: post-procedure vital signs reviewed and stable Respiratory status: patient remains intubated per anesthesia plan Cardiovascular status: stable Postop Assessment: no apparent nausea or vomiting Anesthetic complications: no   No complications documented.  Last Vitals:  Vitals:   09/03/20 0700 09/03/20 0930  BP: 116/74   Pulse: 90   Resp: 18   Temp:    SpO2: 100% 95%    Last Pain:  Vitals:   09/03/20 0400  TempSrc: Oral                 Kennieth Rad

## 2020-09-03 NOTE — Progress Notes (Signed)
Nutrition Follow-up  DOCUMENTATION CODES:   Not applicable  INTERVENTION:   Tube Feeding via PEG:  Pivot 1.5 at 60 ml/hr Provides 2160 kcals, 135 g of protein, 1092 mL of free water  200 ml free water every 4 hours Total free water: 2292 ml   NUTRITION DIAGNOSIS:   Increased nutrient needs related to  (trauma) as evidenced by estimated needs. Ongoing.   GOAL:   Patient will meet greater than or equal to 90% of their needs Met with TF.   MONITOR:   Vent status,TF tolerance  REASON FOR ASSESSMENT:   Ventilator,Consult Enteral/tube feeding initiation and management  ASSESSMENT:   Pt with PMH of COPD followed by Dr Melvyn Novas, HTN, CAD s/p stent who was admitted after fall over 1/2 wall from the second story landing with multiple rib fxs, R pneumothorax, large scalp laceration, and T8 vertebral body fxs, T7, T4-8, C7 fxs.  Pt discussed during ICU rounds and with RN.   Pt had trach and PEG placed today. Noted weight up 7 lb since admission with noted deep pitting edema of BLE and BUE. Pt has received lasix. Chest tube remains. Maxed on fentanyl currently and propofol rate increased.  Thoracic fracture not repaired. Plan for mobilization in brace once off vent. HOB >30 degrees with CTO brace  4/18 s/p trach and PEG  Patient is currently intubated on ventilator support MV: 8.8 L/min Temp (24hrs), Avg:99.1 F (37.3 C), Min:98.5 F (36.9 C), Max:99.6 F (37.6 C)  Propofol: 18 ml/hr provides: 475 kcal    Medications reviewed and include: colace, folic acid, SSI, MVI with minerals, miralax, senokot, thiamine  Labs reviewed: K+ 3.4 CBG's: 114-126  77F PEG  UOP: 3250 ml  R CT: 110 ml  I&O: + 8 L  Diet Order:   Diet Order            Diet NPO time specified  Diet effective midnight                 EDUCATION NEEDS:   No education needs have been identified at this time  Skin:  Skin Assessment: Skin Integrity Issues: Skin Integrity Issues:: Stage II Stage II:  penis  Last BM:  4/16 x 2  Height:   Ht Readings from Last 1 Encounters:  08/17/20 '5\' 8"'  (1.727 m)    Weight:   Wt Readings from Last 1 Encounters:  09/03/20 100.6 kg    Ideal Body Weight:     BMI:  Body mass index is 33.72 kg/m.  Estimated Nutritional Needs:   Kcal:  2000-2200  Protein:  120-135 grams  Fluid:  >2 L/day  Lockie Pares., RD, LDN, CNSC See AMiON for contact information

## 2020-09-03 NOTE — Transfer of Care (Signed)
Immediate Anesthesia Transfer of Care Note  Patient: Charles Weeks  Procedure(s) Performed: PERCUTANEOUS TRACHEOSTOMY USING 6 SHILEY (N/A ) PERCUTANEOUS ENDOSCOPIC GASTROSTOMY (PEG) PLACEMENT (N/A )  Patient Location: ICU  Anesthesia Type:General  Level of Consciousness: Patient remains intubated per anesthesia plan  Airway & Oxygen Therapy: Patient remains intubated per anesthesia plan and Patient placed on Ventilator (see vital sign flow sheet for setting)  Post-op Assessment: Report given to RN and Post -op Vital signs reviewed and stable  Post vital signs: Reviewed and stable  Last Vitals:  Vitals Value Taken Time  BP    Temp    Pulse    Resp    SpO2      Last Pain:  Vitals:   09/03/20 0400  TempSrc: Oral         Complications: No complications documented.

## 2020-09-03 NOTE — Op Note (Addendum)
09/03/2020  8:27 AM  PATIENT:  Charles Weeks  55 y.o. male  PRE-OPERATIVE DIAGNOSIS:  RESPIRATORY FAILURE  POST-OPERATIVE DIAGNOSIS:  RESPIRATORY FAILURE  PROCEDURE:  Procedure(s): PERCUTANEOUS TRACHEOSTOMY USING 6 SHILEY PERCUTANEOUS ENDOSCOPIC GASTROSTOMY (PEG) PLACEMENT  SURGEON:  Surgeon(s): Violeta Gelinas, MD  ASSISTANTS: Wells Guiles, PA-C   ANESTHESIA:   local and general  EBL:  Total I/O In: 50 [IV Piggyback:50] Out: -   BLOOD ADMINISTERED:none  DRAINS: none   SPECIMEN:  No Specimen  DISPOSITION OF SPECIMEN:  N/A  COUNTS:  YES  DICTATION: .Dragon Dictation Procedure in detail: Informed consent was obtained.  He received intravenous antibiotics.  He was brought directly from the intensive care unit to the operating room on the ventilator.  His neck was appropriately positioned and secured in place.  His anterior cervical collar was taken off.  The anterior portion of his thoracic brace was also taken off.  The anterior portion of his neck was prepped and draped in a sterile fashion.  We did a timeout procedure.  Local anesthetic was injected.  A vertical incision was made 2 cm cephalad to the sternal notch over the trachea.  Subcutaneous tissues were dissected down through the platysma using cautery.  We split the strap muscles along the midline and gradually expose the trachea.  There were several small vessels which were cauterized to get excellent hemostasis.  Once we had the trachea well exposed, the endotracheal tube was pulled back while we palpated its position.  Next the Angiocath was inserted between the second and third tracheal ring followed by the guidewire.  Small Blue Rhino dilator was placed followed by the large Blue Rhino dilator.  Next, a #6 Shiley tracheostomy tube was inserted into the trachea over a dilator.  The cuff was inflated and the circuit was attached.  There were excellent volume returns and sats remained 94+.  A single mattress Prolene  suture was placed at the inferior portion of the incision to close it down a little bit.  The trach was secured with Prolene sutures on each side and a Velcro trach tie was placed.  He tolerated this procedure well without apparent complication.  He remained in the operating room for his PEG tube placement.  His anterior abdomen was prepped and draped in a sterile fashion.  We did another timeout procedure.  The endoscope was inserted via his mouth down into his esophagus.  There were no gross abnormality seen in his esophagus.  The scope was advanced into the stomach which was insufflated.  There were no significant gastric abnormalities.  I then passed the scope into the second portion of the duodenum.  There were no ulcers or other abnormalities noted.  I withdrew the scope back into the stomach.  The stomach was further insufflated and we located an excellent poke site.  A small incision was made there and the Angiocath was inserted followed by guidewire.  This was grasped with the endoscopic snare.  The wire was brought out through his mouth and the PEG tube was attached in standard fashion.  The PEG tube was brought back out through the abdominal wall.  I reinserted the scope into the stomach and confirmed position.  The PEG flange was applied along with some antibiotic ointment at the the skin site and a dressing.  The endoscope was removed from his mouth without difficulty.  He tolerated this portion of the procedure well without apparent complication.  An abdominal binder was applied.  Counts were correct  and he was taken back to the intensive care unit directly on the ventilator in critical but stable condition. PATIENT DISPOSITION:  ICU - intubated and critically ill.   Delay start of Pharmacological VTE agent (>24hrs) due to surgical blood loss or risk of bleeding:  no  Violeta Gelinas, MD, MPH, FACS Pager: [phone]  4/18/20228:27 AM

## 2020-09-03 NOTE — Progress Notes (Signed)
Seen by Laurell Josephs, assessed pt condition, updated about v/s, current sedation and foley catheter insertion done last nite, Dr Laurell Josephs said will do the operation at OR probably at [facility], agreed to maintain the current sedation rate of fentanyl at 200 mcg/hr, propofol at 10 mcg/kg/min

## 2020-09-03 NOTE — Progress Notes (Signed)
Foley catheter inserted under aseptic technique with intial urine output=710ml

## 2020-09-04 ENCOUNTER — Inpatient Hospital Stay (HOSPITAL_COMMUNITY): Payer: Medicaid Other

## 2020-09-04 ENCOUNTER — Encounter (HOSPITAL_COMMUNITY): Payer: Self-pay | Admitting: General Surgery

## 2020-09-04 LAB — BASIC METABOLIC PANEL
Anion gap: 6 (ref 5–15)
BUN: 27 mg/dL — ABNORMAL HIGH (ref 6–20)
CO2: 40 mmol/L — ABNORMAL HIGH (ref 22–32)
Calcium: 8.3 mg/dL — ABNORMAL LOW (ref 8.9–10.3)
Chloride: 97 mmol/L — ABNORMAL LOW (ref 98–111)
Creatinine, Ser: 0.84 mg/dL (ref 0.61–1.24)
GFR, Estimated: >60 mL/min (ref >60)
Glucose, Bld: 133 mg/dL — ABNORMAL HIGH (ref 70–99)
Potassium: 3.6 mmol/L (ref 3.5–5.1)
Sodium: 143 mmol/L (ref 135–145)

## 2020-09-04 LAB — CBC
HCT: 24.6 % — ABNORMAL LOW (ref 39.0–52.0)
Hemoglobin: 7.2 g/dL — ABNORMAL LOW (ref 13.0–17.0)
MCH: 30.4 pg (ref 26.0–34.0)
MCHC: 29.3 g/dL — ABNORMAL LOW (ref 30.0–36.0)
MCV: 103.8 fL — ABNORMAL HIGH (ref 80.0–100.0)
Platelets: 343 10*3/uL (ref 150–400)
RBC: 2.37 MIL/uL — ABNORMAL LOW (ref 4.22–5.81)
RDW: 17.1 % — ABNORMAL HIGH (ref 11.5–15.5)
WBC: 7 10*3/uL (ref 4.0–10.5)
nRBC: 0 % (ref 0.0–0.2)

## 2020-09-04 LAB — GLUCOSE, CAPILLARY
Glucose-Capillary: 116 mg/dL — ABNORMAL HIGH (ref 70–99)
Glucose-Capillary: 118 mg/dL — ABNORMAL HIGH (ref 70–99)
Glucose-Capillary: 135 mg/dL — ABNORMAL HIGH (ref 70–99)
Glucose-Capillary: 136 mg/dL — ABNORMAL HIGH (ref 70–99)
Glucose-Capillary: 138 mg/dL — ABNORMAL HIGH (ref 70–99)
Glucose-Capillary: 145 mg/dL — ABNORMAL HIGH (ref 70–99)

## 2020-09-04 MED ORDER — LIDOCAINE HCL (PF) 1 % IJ SOLN
INTRAMUSCULAR | Status: AC
  Filled 2020-09-04: qty 5

## 2020-09-04 NOTE — Progress Notes (Signed)
Patient ID: Charles Weeks, male   DOB: 07/28/1965, 55 y.o.   MRN: [id number] Follow up - Trauma Critical Care  Patient Details:    Charles Weeks is an 55 y.o. male.  Lines/tubes : CVC Triple Lumen 08/18/20 Right Subclavian (Active)  Indication for Insertion or Continuance of Line Prolonged intravenous therapies 09/03/20 2000  Site Assessment Clean;Dry;Intact 09/03/20 2000  Proximal Lumen Status In-line blood sampling system in place;Blood return noted 09/03/20 2000  Medial Lumen Status Infusing 09/03/20 2000  Distal Lumen Status Infusing 09/03/20 2000  Dressing Type Transparent;Occlusive 09/03/20 2000  Dressing Status Clean;Dry;Intact 09/03/20 2000  Antimicrobial disc in place? Yes 09/03/20 2000  Line Care Connections checked and tightened 09/03/20 2000  Dressing Intervention Dressing changed 09/02/20 2300  Dressing Change Due 09/09/20 09/03/20 0900     Chest Tube Lateral;Right (Active)  Status To water seal 09/03/20 2000  Chest Tube Air Leak None 09/03/20 2000  Patency Intervention Tip/tilt 09/03/20 0900  Drainage Description Serosanguineous 09/03/20 0900  Dressing Status Clean;Intact;Dry 09/03/20 2000  Dressing Intervention Other (Comment) 09/03/20 1200  Site Assessment Clean;Intact;Dry 09/03/20 2000  Surrounding Skin Dry;Other (Comment) 09/03/20 0900  Output (mL) 60 mL 09/04/20 0500     Gastrostomy/Enterostomy Percutaneous endoscopic gastrostomy (PEG) 20 Fr. LUQ (Active)  Surrounding Skin Dry;Intact 09/03/20 2000  Tube Status Patent 09/03/20 2000  Drainage Appearance Bright red 09/03/20 2000  Dressing Status Clean;Dry;Intact 09/03/20 2000     Urethral Catheter Rosemarie RN Double-lumen 16 Fr. (Active)  Indication for Insertion or Continuance of Catheter Acute urinary retention (I&O Cath for 24 hrs prior to catheter insertion- Inpatient Only) 09/03/20 2000  Site Assessment Clean;Intact;Edema 09/03/20 2000  Catheter Maintenance Bag below level of bladder;Catheter  secured;Drainage bag/tubing not touching floor;Insertion date on drainage bag;No dependent loops;Seal intact 09/03/20 2000  Collection Container Standard drainage bag 09/03/20 2000  Securement Method Securing device (Describe) 09/03/20 2000  Urinary Catheter Interventions (if applicable) Unclamped 09/03/20 2000  Output (mL) 75 mL 09/04/20 0500    Microbiology/Sepsis markers: Results for orders placed or performed during the hospital encounter of 08/17/20  Resp Panel by RT-PCR (Flu A&B, Covid) Nasopharyngeal Swab     Status: None   Collection Time: 08/17/20  3:40 PM   Specimen: Nasopharyngeal Swab; Nasopharyngeal(NP) swabs in vial transport medium  Result Value Ref Range Status   SARS Coronavirus 2 by RT PCR NEGATIVE NEGATIVE Final    Comment: (NOTE) SARS-CoV-2 target nucleic acids are NOT DETECTED.  The SARS-CoV-2 RNA is generally detectable in upper respiratory specimens during the acute phase of infection. The lowest concentration of SARS-CoV-2 viral copies this assay can detect is 138 copies/mL. A negative result does not preclude SARS-Cov-2 infection and should not be used as the sole basis for treatment or other patient management decisions. A negative result may occur with  improper specimen collection/handling, submission of specimen other than nasopharyngeal swab, presence of viral mutation(s) within the areas targeted by this assay, and inadequate number of viral copies(<138 copies/mL). A negative result must be combined with clinical observations, patient history, and epidemiological information. The expected result is Negative.  Fact Sheet for Patients:  BloggerCourse.com  Fact Sheet for Healthcare Providers:  SeriousBroker.it  This test is no t yet approved or cleared by the [place] FDA and  has been authorized for detection and/or diagnosis of SARS-CoV-2 by FDA under an Emergency Use Authorization (EUA). This  EUA will remain  in effect (meaning this test can be used) for the duration of the COVID-19 declaration under Section 564(b)(1)  of the Act, 21 U.S.C.section 360bbb-3(b)(1), unless the authorization is terminated  or revoked sooner.       Influenza A by PCR NEGATIVE NEGATIVE Final   Influenza B by PCR NEGATIVE NEGATIVE Final    Comment: (NOTE) The Xpert Xpress SARS-CoV-2/FLU/RSV plus assay is intended as an aid in the diagnosis of influenza from Nasopharyngeal swab specimens and should not be used as a sole basis for treatment. Nasal washings and aspirates are unacceptable for Xpert Xpress SARS-CoV-2/FLU/RSV testing.  Fact Sheet for Patients: BloggerCourse.com  Fact Sheet for Healthcare Providers: SeriousBroker.it  This test is not yet approved or cleared by the [place] FDA and has been authorized for detection and/or diagnosis of SARS-CoV-2 by FDA under an Emergency Use Authorization (EUA). This EUA will remain in effect (meaning this test can be used) for the duration of the COVID-19 declaration under Section 564(b)(1) of the Act, 21 U.S.C. section 360bbb-3(b)(1), unless the authorization is terminated or revoked.  Performed at [facility], [address], [place], [place] [address]   Surgical PCR screen     Status: None   Collection Time: 08/19/20  7:38 AM   Specimen: Nasal Mucosa; Nasal Swab  Result Value Ref Range Status   MRSA, PCR NEGATIVE NEGATIVE Final   Staphylococcus aureus NEGATIVE NEGATIVE Final    Comment: (NOTE) The Xpert SA Assay (FDA approved for NASAL specimens in patients 6 years of age and older), is one component of a comprehensive surveillance program. It is not intended to diagnose infection nor to guide or monitor treatment. Performed at [facility], [address] [place], [place] [address]     Anti-infectives:  Anti-infectives (From admission, onward)   Start      Dose/Rate Route Frequency Ordered Stop   08/19/20 0845  cefTRIAXone (ROCEPHIN) 2 g in sodium chloride 0.9 % 100 mL IVPB  Status:  Discontinued        2 g 200 mL/hr over 30 Minutes Intravenous Every 24 hours 08/19/20 0753 08/25/20 1304   08/17/20 1515  ceFAZolin (ANCEF) IVPB 2g/100 mL premix        2 g 200 mL/hr over 30 Minutes Intravenous  Once 08/17/20 1500 08/17/20 1530     Consults: Treatment Team:  Tia Alert, MD     Subjective:    Overnight Issues:   Objective:  Vital signs for last 24 hours: Temp:  [98 F (36.7 C)-99.1 F (37.3 C)] 98.8 F (37.1 C) (04/19 0800) Pulse Rate:  [81-101] 93 (04/19 0700) Resp:  [14-19] 18 (04/19 0700) BP: (84-133)/(54-83) 102/63 (04/19 0700) SpO2:  [90 %-100 %] 94 % (04/19 0741) FiO2 (%):  [50 %-60 %] 60 % (04/19 0741)  Hemodynamic parameters for last 24 hours:    Intake/Output from previous day: 04/18 0701 - 04/19 0700 In: 2638 [I.V.:709.2; NG/GT:1339; IV Piggyback:589.8] Out: 1630 [Urine:1460; Blood:10; Chest Tube:160]  Intake/Output this shift: No intake/output data recorded.  Vent settings for last 24 hours: Vent Mode: PRVC FiO2 (%):  [50 %-60 %] 60 % Set Rate:  [18 bmp] 18 bmp Vt Set:  [480 mL] 480 mL PEEP:  [8 cmH20] 8 cmH20 Pressure Support:  [5 cmH20] 5 cmH20 Plateau Pressure:  [18 cmH20-36 cmH20] 18 cmH20 Physical Exam:  General: on vent Neuro: awake on vent and F.C HEENT/Neck: trach-clean, intact Resp: a bit decreased on L CVS: RRR 110 GI: soft, PEG in place Extremities: edema 1+  Results for orders placed or performed during the hospital encounter of 08/17/20 (  from the past 24 hour(s))  Glucose, capillary     Status: Abnormal   Collection Time: 09/03/20 11:47 AM  Result Value Ref Range   Glucose-Capillary 126 (H) 70 - 99 mg/dL  Glucose, capillary     Status: Abnormal   Collection Time: 09/03/20  3:52 PM  Result Value Ref Range   Glucose-Capillary 110 (H) 70 - 99 mg/dL  Glucose, capillary     Status:  Abnormal   Collection Time: 09/03/20  7:43 PM  Result Value Ref Range   Glucose-Capillary 111 (H) 70 - 99 mg/dL  Glucose, capillary     Status: Abnormal   Collection Time: 09/03/20 11:38 PM  Result Value Ref Range   Glucose-Capillary 150 (H) 70 - 99 mg/dL  Glucose, capillary     Status: Abnormal   Collection Time: 09/04/20  3:40 AM  Result Value Ref Range   Glucose-Capillary 116 (H) 70 - 99 mg/dL  CBC     Status: Abnormal   Collection Time: 09/04/20  5:00 AM  Result Value Ref Range   WBC 7.0 4.0 - 10.5 K/uL   RBC 2.37 (L) 4.22 - 5.81 MIL/uL   Hemoglobin 7.2 (L) 13.0 - 17.0 g/dL   HCT 07.6 (L) 80.8 - 81.1 %   MCV 103.8 (H) 80.0 - 100.0 fL   MCH 30.4 26.0 - 34.0 pg   MCHC 29.3 (L) 30.0 - 36.0 g/dL   RDW 03.1 (H) 59.4 - 58.5 %   Platelets 343 150 - 400 K/uL   nRBC 0.0 0.0 - 0.2 %  Basic metabolic panel     Status: Abnormal   Collection Time: 09/04/20  5:00 AM  Result Value Ref Range   Sodium 143 135 - 145 mmol/L   Potassium 3.6 3.5 - 5.1 mmol/L   Chloride 97 (L) 98 - 111 mmol/L   CO2 40 (H) 22 - 32 mmol/L   Glucose, Bld 133 (H) 70 - 99 mg/dL   BUN 27 (H) 6 - 20 mg/dL   Creatinine, Ser 9.29 0.61 - 1.24 mg/dL   Calcium 8.3 (L) 8.9 - 10.3 mg/dL   GFR, Estimated >24 >46 mL/min   Anion gap 6 5 - 15  Glucose, capillary     Status: Abnormal   Collection Time: 09/04/20  7:40 AM  Result Value Ref Range   Glucose-Capillary 135 (H) 70 - 99 mg/dL    Assessment & Plan: Present on Admission: . Thoracic spine fracture (HCC)    LOS: 18 days   Additional comments:I reviewed the patient's new clinical lab test results. . Fall from height  R PTX- CT in place on water seal,160cc/24h, continue today Enlarging L pleural effusion - place pigtail chest tube Multiple B rib fxs- these are at the junction of the thoracic vertebral bodies, pain control, IS 3 column T8 vertebral body fx-perDr. Jones,non-op now, plan mobilize in brace once off vent. Needs CTO brace for HOB>30 degrees.  I D/W Dr. Yetta Barre this AM. T7 neural arch fx- per nsgy R TVP T4-8 - pain control, per nsgy R C7 TVP fx through foramen- C collar per Dr. Yetta Barre, CTAnegatvie Large scalp laceration/right eyebrow laceration- repair by Dr. Lovick4/05/2020, sutures removed Right neck abrasion/skin tear- bacitracin BID Facial ecchymosis- CT maxillofacialdemonstrates no facial bone fractures. Some anterior subluxation of the right mandibular condyle HTN- home lopressor 25mg  daily CAD/H/O MI/DES- on ASA at home. Hold for now. Monitor, on tele Acute hypercarbic ventilator dependent respiratory failure/COPD-60% and PEEP8.Wean as tolerated to maintain sats >88%.Solumedrol x5d per CCM  recs, restartedhome inhalers 4/8. Poor weaning. Trach/PEG 4/18 by Dr. Janee Morn Rash on back- benadryl Hypotension- resolved Depression/anxiety- restart home bupropion whentaking PO, restarted home buspar BPH- home terazosin, failed ToV, increased bethanachol 4/15. Failed voiding trial 4/17 and foley back in. D/C foley and I&O q6h ABL anemia- stable FEN-TF,bowel regimen, no lasix today VTE-LMWH Dispo- ICU Will place pigtail chest tube for enlarging L pleural effusion. Will D/W family. Critical Care Total Time*: 41 Minutes  Violeta Gelinas, MD, MPH, FACS Trauma & General Surgery Use [email or web address] to contact on call provider  09/04/2020  *Care during the described time interval was provided by me. I have reviewed this patient's available data, including medical history, events of note, physical examination and test results as part of my evaluation.

## 2020-09-04 NOTE — Progress Notes (Deleted)
Date and time results received: 09/04/20 1500 (use smartphrase ".now" to insert current time)  Test: CT chest Critical Value:  R>L bilateral PE; no heart strain  Name of Provider Notified: J. Xu MD  Orders Received? Or Actions Taken?: Actions Taken: Orders already placed today for hep gtt

## 2020-09-04 NOTE — Procedures (Signed)
Insertion of Chest Tube Procedure Note  Bartolo Montanye  [id number]  [birth year]  Date:09/04/20  Time:1:36 PM    Provider Performing: Eric Form   Procedure: Chest Tube Insertion [phone])  Indication(s) Hemothorax  Consent Risks of the procedure as well as the alternatives and risks of each were explained to the patient and/or caregiver.  Consent for the procedure was obtained and is signed in the bedside chart  Anesthesia topical lidocaine and vent sedation   Time Out Verified patient identification, verified procedure, site/side was marked, verified correct patient position, special equipment/implants available, medications/allergies/relevant history reviewed, required imaging and test results available.   Sterile Technique Maximal sterile technique including full sterile barrier drape, hand hygiene, sterile gown, sterile gloves, mask, hair covering, sterile ultrasound probe cover (if used).   Procedure Description Ultrasound not used to identify appropriate pleural anatomy for placement and overlying skin marked. Area of placement cleaned and draped in sterile fashion.  A 20 French chest tube was placed into the left pleural space using Kelly dissection. Appropriate return of blood was obtained.  The tube was connected to atrium and placed on -20 cm H2O wall suction.   Complications/Tolerance None; patient tolerated the procedure well. Chest X-ray is ordered to verify placement.   EBL over 1 liter of blood returned with left chest tube placement  Specimen(s) none   Eric Form, PA-C 09/04/20 1:40 PM

## 2020-09-04 NOTE — Progress Notes (Signed)
PT Cancellation Note  Patient Details Name: Charles Weeks MRN: [id number] DOB: Aug 07, 1965   Cancelled Treatment:    Reason Eval/Treat Not Completed: Medical issues which prohibited therapy;Patient not medically ready - Pt remains intubated, sedated. RN states they will perform PROM during cares/turning, no PT needs today. Will check back tomorrow.  Marye Round, PT DPT Acute Rehabilitation Services Pager [phone]  Office [phone]    Truddie Coco 09/04/2020, 11:54 AM

## 2020-09-05 ENCOUNTER — Inpatient Hospital Stay (HOSPITAL_COMMUNITY): Payer: Medicaid Other

## 2020-09-05 LAB — GLUCOSE, CAPILLARY
Glucose-Capillary: 129 mg/dL — ABNORMAL HIGH (ref 70–99)
Glucose-Capillary: 141 mg/dL — ABNORMAL HIGH (ref 70–99)
Glucose-Capillary: 142 mg/dL — ABNORMAL HIGH (ref 70–99)
Glucose-Capillary: 149 mg/dL — ABNORMAL HIGH (ref 70–99)
Glucose-Capillary: 149 mg/dL — ABNORMAL HIGH (ref 70–99)
Glucose-Capillary: 150 mg/dL — ABNORMAL HIGH (ref 70–99)

## 2020-09-05 LAB — BASIC METABOLIC PANEL
Anion gap: 8 (ref 5–15)
BUN: 21 mg/dL — ABNORMAL HIGH (ref 6–20)
CO2: 39 mmol/L — ABNORMAL HIGH (ref 22–32)
Calcium: 8.6 mg/dL — ABNORMAL LOW (ref 8.9–10.3)
Chloride: 94 mmol/L — ABNORMAL LOW (ref 98–111)
Creatinine, Ser: 0.67 mg/dL (ref 0.61–1.24)
GFR, Estimated: >60 mL/min (ref >60)
Glucose, Bld: 124 mg/dL — ABNORMAL HIGH (ref 70–99)
Potassium: 3.3 mmol/L — ABNORMAL LOW (ref 3.5–5.1)
Sodium: 141 mmol/L (ref 135–145)

## 2020-09-05 LAB — TRIGLYCERIDES: Triglycerides: 140 mg/dL (ref <150)

## 2020-09-05 LAB — CBC
HCT: 25.9 % — ABNORMAL LOW (ref 39.0–52.0)
Hemoglobin: 7.7 g/dL — ABNORMAL LOW (ref 13.0–17.0)
MCH: 30.2 pg (ref 26.0–34.0)
MCHC: 29.7 g/dL — ABNORMAL LOW (ref 30.0–36.0)
MCV: 101.6 fL — ABNORMAL HIGH (ref 80.0–100.0)
Platelets: 352 10*3/uL (ref 150–400)
RBC: 2.55 MIL/uL — ABNORMAL LOW (ref 4.22–5.81)
RDW: 17 % — ABNORMAL HIGH (ref 11.5–15.5)
WBC: 7.8 10*3/uL (ref 4.0–10.5)
nRBC: 0 % (ref 0.0–0.2)

## 2020-09-05 LAB — MAGNESIUM: Magnesium: 2 mg/dL (ref 1.7–2.4)

## 2020-09-05 LAB — PHOSPHORUS: Phosphorus: 4.4 mg/dL (ref 2.5–4.6)

## 2020-09-05 MED ORDER — ENOXAPARIN SODIUM 40 MG/0.4ML ~~LOC~~ SOLN
40.0000 mg | Freq: Two times a day (BID) | SUBCUTANEOUS | Status: DC
  Administered 2020-09-05 – 2020-10-09 (×69): 40 mg via SUBCUTANEOUS
  Filled 2020-09-05 (×68): qty 0.4

## 2020-09-05 MED ORDER — MORPHINE SULFATE (PF) 4 MG/ML IV SOLN
INTRAVENOUS | Status: AC
  Filled 2020-09-05: qty 1

## 2020-09-05 MED ORDER — FUROSEMIDE 10 MG/ML IJ SOLN
40.0000 mg | Freq: Once | INTRAMUSCULAR | Status: AC
  Administered 2020-09-05: 40 mg via INTRAVENOUS
  Filled 2020-09-05: qty 4

## 2020-09-05 MED ORDER — POTASSIUM CHLORIDE 20 MEQ PO PACK
40.0000 meq | PACK | ORAL | Status: AC
  Administered 2020-09-05 (×2): 40 meq
  Filled 2020-09-05 (×2): qty 2

## 2020-09-05 MED ORDER — MORPHINE SULFATE (PF) 4 MG/ML IV SOLN
4.0000 mg | INTRAVENOUS | Status: DC | PRN
Start: 2020-09-05 — End: 2020-09-07
  Administered 2020-09-05 – 2020-09-07 (×7): 4 mg via INTRAVENOUS
  Filled 2020-09-05 (×7): qty 1

## 2020-09-05 NOTE — Progress Notes (Signed)
Trauma/Critical Care Follow Up Note  Subjective:    Overnight Issues:   Objective:  Vital signs for last 24 hours: Temp:  [98.7 F (37.1 C)-100.9 F (38.3 C)] 100.9 F (38.3 C) (04/20 1600) Pulse Rate:  [89-116] 106 (04/20 1700) Resp:  [8-28] 14 (04/20 1700) BP: (107-145)/(66-90) 118/69 (04/20 1700) SpO2:  [88 %-96 %] 90 % (04/20 1700) FiO2 (%):  [40 %-50 %] 40 % (04/20 1524)  Hemodynamic parameters for last 24 hours:    Intake/Output from previous day: 04/19 0701 - 04/20 0700 In: 2313.4 [I.V.:413.4; NG/GT:1900] Out: 2510 [Urine:1000; Chest Tube:1510]  Intake/Output this shift: Total I/O In: 2007.7 [I.V.:7.7; NG/GT:2000] Out: 1780 [Urine:1650; Chest Tube:130]  Vent settings for last 24 hours: Vent Mode: PRVC FiO2 (%):  [40 %-50 %] 40 % Set Rate:  [18 bmp] 18 bmp Vt Set:  [480 mL] 480 mL PEEP:  [8 cmH20] 8 cmH20 Pressure Support:  [10 cmH20] 10 cmH20 Plateau Pressure:  [15 cmH20-22 cmH20] 15 cmH20  Physical Exam:  Gen: comfortable, no distress Neuro: non-focal exam HEENT: PERRL Neck: c-collar CV: RRR Pulm: unlabored breathing Abd: soft, NT GU: clear yellow urine Extr: wwp, 1+ edema   Results for orders placed or performed during the hospital encounter of 08/17/20 (from the past 24 hour(s))  Glucose, capillary     Status: Abnormal   Collection Time: 09/04/20  7:59 PM  Result Value Ref Range   Glucose-Capillary 118 (H) 70 - 99 mg/dL  Glucose, capillary     Status: Abnormal   Collection Time: 09/04/20 11:47 PM  Result Value Ref Range   Glucose-Capillary 145 (H) 70 - 99 mg/dL  Glucose, capillary     Status: Abnormal   Collection Time: 09/05/20  3:47 AM  Result Value Ref Range   Glucose-Capillary 129 (H) 70 - 99 mg/dL  Triglycerides     Status: None   Collection Time: 09/05/20  5:10 AM  Result Value Ref Range   Triglycerides 140 <150 mg/dL  CBC     Status: Abnormal   Collection Time: 09/05/20  5:10 AM  Result Value Ref Range   WBC 7.8 4.0 - 10.5  K/uL   RBC 2.55 (L) 4.22 - 5.81 MIL/uL   Hemoglobin 7.7 (L) 13.0 - 17.0 g/dL   HCT 10.2 (L) 58.5 - 27.7 %   MCV 101.6 (H) 80.0 - 100.0 fL   MCH 30.2 26.0 - 34.0 pg   MCHC 29.7 (L) 30.0 - 36.0 g/dL   RDW 82.4 (H) 23.5 - 36.1 %   Platelets 352 150 - 400 K/uL   nRBC 0.0 0.0 - 0.2 %  Basic metabolic panel     Status: Abnormal   Collection Time: 09/05/20  5:10 AM  Result Value Ref Range   Sodium 141 135 - 145 mmol/L   Potassium 3.3 (L) 3.5 - 5.1 mmol/L   Chloride 94 (L) 98 - 111 mmol/L   CO2 39 (H) 22 - 32 mmol/L   Glucose, Bld 124 (H) 70 - 99 mg/dL   BUN 21 (H) 6 - 20 mg/dL   Creatinine, Ser 4.43 0.61 - 1.24 mg/dL   Calcium 8.6 (L) 8.9 - 10.3 mg/dL   GFR, Estimated >15 >40 mL/min   Anion gap 8 5 - 15  Glucose, capillary     Status: Abnormal   Collection Time: 09/05/20  7:45 AM  Result Value Ref Range   Glucose-Capillary 141 (H) 70 - 99 mg/dL  Magnesium     Status: None   Collection  Time: 09/05/20  9:13 AM  Result Value Ref Range   Magnesium 2.0 1.7 - 2.4 mg/dL  Phosphorus     Status: None   Collection Time: 09/05/20  9:13 AM  Result Value Ref Range   Phosphorus 4.4 2.5 - 4.6 mg/dL  Glucose, capillary     Status: Abnormal   Collection Time: 09/05/20 11:25 AM  Result Value Ref Range   Glucose-Capillary 149 (H) 70 - 99 mg/dL  Glucose, capillary     Status: Abnormal   Collection Time: 09/05/20  4:12 PM  Result Value Ref Range   Glucose-Capillary 150 (H) 70 - 99 mg/dL    Assessment & Plan: The plan of care was discussed with the bedside nurse for the day, who is in agreement with this plan and no additional concerns were raised.   Present on Admission:  Thoracic spine fracture (HCC)    LOS: 19 days   Additional comments:I reviewed the patient's new clinical lab test results.   and I reviewed the patients new imaging test results.    Fall from height   R PTX - CT in place on water seal, 110cc/24h, continue today Enlarging L pleural effusion - L chest tube placed 4/19,  to WS today, monitor o/p Multiple B rib fxs - these are at the junction of the thoracic vertebral bodies, pain control, IS 3 column T8 vertebral body fx - per Dr. Yetta Barre, non-op now, plan mobilize in brace once off vent. Needs CTO brace for HOB>30 degrees. I D/W Dr. Yetta Barre this AM. T7 neural arch fx - per nsgy R TVP T4-8 - pain control, per nsgy R C7 TVP fx through foramen - C collar per Dr. Yetta Barre, CTA negatvie Large scalp laceration/right eyebrow laceration - repair by Dr. Bedelia Person 08/17/2020, sutures removed Right neck abrasion/skin tear - bacitracin BID Facial ecchymosis - CT maxillofacial demonstrates no facial bone fractures.  Some anterior subluxation of the right mandibular condyle HTN - home lopressor 25mg  daily CAD/H/O MI/DES - on ASA at home.  Hold for now.  Monitor, on tele Acute hypercarbic ventilator dependent respiratory failure/COPD - 60% and PEEP8. Wean as tolerated to maintain sats >88%. Solumedrol x5d per CCM recs, restarted home inhalers 4/8. Poor weaning. Trach/PEG 4/18 by Dr. 5/18 Rash on back - benadryl Hypotension - resolved Depression/anxiety - restart home bupropion when taking PO, restarted home buspar BPH - home terazosin, repeatedly failed ToV, increased bethanachol 4/15. I&O q6h ABL anemia - stable FEN - TF, bowel regimen, lasix x1 today VTE - LMWH Dispo - ICU  Critical Care Total Time: 35 minutes  5/15, MD Trauma & General Surgery Please use [email or web address] to contact on call provider  09/05/2020  *Care during the described time interval was provided by me. I have reviewed this patient's available data, including medical history, events of note, physical examination and test results as part of my evaluation.

## 2020-09-05 NOTE — Evaluation (Signed)
Physical Therapy Re-Evaluation Patient Details Name: Charles Weeks MRN: [id number] DOB: June 03, 1965 Today's Date: 09/05/2020   History of Present Illness  Pt is a 55 y.o. male who presented 4/1 after falling down about 11 stairs and sustained R PTX, anterior subluxation of R mandibular condyle, multiple bil ribs fxs at junction of thoracic vertebral bodies T5-8 on R and T7-8 on L, 3 column T8 vertebral body fx, T7 neural arch fx, R TVP T4-8 fxs, and R C7 TVP fx through foramen. R chest tube inserted 4/1. Pt found to be hypoxic and was intubated 4/2. No evidence of vascular injury to neck, facial fx, acute intracranial injury, lumbar spine fx, R elbow acute abnormality, or pelvic fx identified on imaging. S/p trach and PEG placement 4/18. S/p L chest tube placement 4/19. PMH: HTN, COPD, CAD, s/p MI with DES okacenebt ub RCA in Jan 2020 just on ASA, depression/anxiety, and BPH.    Clinical Impression  MD with verbal confirmation to progress pt's mobility to EOB, standing, etc as possible. Pt presents with condition above and deficits mentioned below, see PT Problem List. Pt with trach on the vent and very lethargic this date, following < 10% of commands. Thus, unsure of PLOF or living situation as no family present during eval. Pt is at high risk for pressure ulcers and decreased skin integrity and contractures due to his prolonged immobilization and limited AROM. Pt displays edema in his limbs, primarily distally, and in his scrotum and penis, which may be limiting his PROM into ankle dorsiflexion, knee flexion, wrist extension, and fingers extension bilaterally. Provided PROM to all limbs and retrograde edema massage to hands and positioned pt with bil feet in PRAFOs with bed in semi-egress position at end of session to decrease risk for contractures and pressure ulcers and facilitate improved lung function. Pt with generalized weakness, incoordination, and imbalance, moving all 4 extremities more distally  than proximally through partial AROM spontaneously during session. Pt needing TAx2 for all bed mobility. Will continue to follow acutely. Pt would benefit from intensive therapy in the CIR setting at d/c at this time.    Follow Up Recommendations CIR;Supervision/Assistance - 24 hour (may change with progression)    Equipment Recommendations  Other (comment) (TBD)    Recommendations for Other Services Rehab consult     Precautions / Restrictions Precautions Precautions: Cervical;Back Precaution Booklet Issued: No Precaution Comments: Bil chest tube, trach on vent, bil wrist restraints, TLSO donned with rolling if HOB > 30 deg, cervical collar at all times, abdominal binder to protect PEG Required Braces or Orthoses: Cervical Brace;Spinal Brace Cervical Brace: Hard collar;At all times Spinal Brace: Thoracolumbosacral orthotic;Applied in supine position (donn when [facility] raised) Restrictions Weight Bearing Restrictions: No      Mobility  Bed Mobility Overal bed mobility: Needs Assistance Bed Mobility: Rolling;Supine to Sit Rolling: Total assist;+2 for physical assistance;+2 for safety/equipment   Supine to sit: Total assist;+2 for physical assistance;+2 for safety/equipment;HOB elevated     General bed mobility comments: Roll L and R 2x each with TAx2 with pt not following cues to assist. Supine > sit lifting trunk from elevated HOB with bed in semi-egress position, with TAx2 and no assistance/activation noted by pt.    Transfers                 General transfer comment: Deferred for safety  Ambulation/Gait             General Gait Details: Deferred for safety  Stairs  Wheelchair Mobility    Modified Rankin (Stroke Patients Only)       Balance                                             Pertinent Vitals/Pain Pain Assessment: Faces Faces Pain Scale: Hurts little more Pain Location: generalized with PROM and mobility  noted through grimacing and guarding Pain Descriptors / Indicators: Grimacing;Guarding Pain Intervention(s): Limited activity within patient's tolerance;Monitored during session;Repositioned    Home Living Family/patient expects to be discharged to:: Unsure                 Additional Comments: Pt lethargic with trach on vent and not following majority of commands to answer questions    Prior Function           Comments: Unsure, likely independent based on chart review. Pt lethargic with trach on vent and not following majority of commands to answer questions. No family present during eval.     Hand Dominance        Extremity/Trunk Assessment   Upper Extremity Assessment RUE Deficits / Details: Limited passive wrist and fingers extension PROM with noted edema in dorsal aspect of hand primarily; unable to follow commands to assess strength formally, but spontaneous movements/resistance noted in hands with occasion lifting of arms against gravity but not full AROM (<45 degrees shoulder flexion) LUE Deficits / Details: Limited passive wrist and fingers extension PROM with noted edema in dorsal aspect of hand primarily; unable to follow commands to assess strength formally, but spontaneous movements/resistance noted in hands with occasion lifting of arms against gravity but not full AROM (<45 degrees shoulder flexion)    Lower Extremity Assessment RLE Deficits / Details: Edema noted in feet; limited PROM ankle dorsiflexion and knee flexion; unable to formally assess strength as pt not following commands this date, but spontaneous movements/resistance noted especially with PROM knee flexion and ankle dorsiflexion; pt does wiggle toes to cues on occasion but unable to lift legs off bed to 'kick'; bil clonus noted LLE Deficits / Details: Edema noted in feet; limited PROM ankle dorsiflexion and knee flexion; unable to formally assess strength as pt not following commands this date, but  spontaneous movements/resistance noted especially with PROM knee flexion and ankle dorsiflexion; pt does wiggle toes to cues on occasion but unable to lift legs off bed to 'kick'; bil clonus noted    Cervical / Trunk Assessment Cervical / Trunk Assessment: Other exceptions Cervical / Trunk Exceptions: fxs with TLSO and cervical brace  Communication   Communication: Tracheostomy;Other (comment) (lethargic)  Cognition Arousal/Alertness: Lethargic Behavior During Therapy: Flat affect Overall Cognitive Status: Difficult to assess                                 General Comments: Pt very lethargic, needing his name called or stimulation to open eyes and maintain attention for biref moment. Follows < 10 % of simple multi-modal commands with delayed processing/response.      General Comments General comments (skin integrity, edema, etc.): Vent settings = PRVC, 40% conc, PEEP 8    Exercises General Exercises - Upper Extremity Shoulder Flexion: PROM;Both;10 reps;Supine (<90 degrees) Elbow Flexion: PROM;Both;10 reps;Supine Elbow Extension: PROM;Both;10 reps;Supine Wrist Flexion: PROM;Both;10 reps;Supine Wrist Extension: PROM;Both;10 reps;Supine Digit Composite Flexion: PROM;Both;10 reps;Supine Composite Extension: PROM;Both;10 reps;Supine  Other Exercises Other Exercises: Retrograde edema massage performed to bil hands Other Exercises: PROM bil ankle dorsiflexion, plantarflexion, eversion, and inversion supine 10x Other Exercises: PROM bil knee and hip combo flexion supine 10x Other Exercises: PROM bil knee extension supine 10x   Assessment/Plan    PT Assessment Patient needs continued PT services  PT Problem List Decreased strength;Decreased range of motion;Decreased activity tolerance;Decreased mobility;Decreased knowledge of precautions;Decreased skin integrity;Decreased balance;Decreased coordination;Decreased cognition;Decreased knowledge of use of DME;Decreased safety  awareness       PT Treatment Interventions Therapeutic exercise;DME instruction;Gait training;Functional mobility training;Therapeutic activities;Balance training;Neuromuscular re-education;Patient/family education;Cognitive remediation;Stair training;Manual techniques    PT Goals (Current goals can be found in the Care Plan section)  Acute Rehab PT Goals Patient Stated Goal: did not state PT Goal Formulation: Patient unable to participate in goal setting Time For Goal Achievement: 09/19/20 Potential to Achieve Goals: Fair    Frequency Min 3X/week   Barriers to discharge        Co-evaluation               AM-PAC PT "6 Clicks" Mobility  Outcome Measure Help needed turning from your back to your side while in a flat bed without using bedrails?: Total Help needed moving from lying on your back to sitting on the side of a flat bed without using bedrails?: Total Help needed moving to and from a bed to a chair (including a wheelchair)?: Total Help needed standing up from a chair using your arms (e.g., wheelchair or bedside chair)?: Total Help needed to walk in hospital room?: Total Help needed climbing 3-5 steps with a railing? : Total 6 Click Score: 6    End of Session Equipment Utilized During Treatment: Cervical collar;Back brace;Oxygen Activity Tolerance: Patient limited by lethargy Patient left: in bed;with call bell/phone within reach;with restraints reapplied;with SCD's reapplied;with bed alarm set (with PRAFOs donned; bed in semi-egress position) Nurse Communication: Mobility status;Other (comment) (to lift legs of bed if not present to monitor pt to ensure no slipping down) PT Visit Diagnosis: Muscle weakness (generalized) (M62.81);Difficulty in walking, not elsewhere classified (R26.2)    Time: 3785-8850 PT Time Calculation (min) (ACUTE ONLY): 33 min   Charges:   PT Evaluation $PT Re-evaluation: 1 Re-eval PT Treatments $Therapeutic Activity: 8-22 mins         Raymond Gurney, PT, DPT Acute Rehabilitation Services  Pager: [phone] Office: [phone]   Jewel Baize 09/05/2020, 6:13 PM

## 2020-09-05 NOTE — Progress Notes (Signed)
Wasted 2mL of Fentantyl from bag. Witnessed by Talmage Coin, RN.

## 2020-09-06 DIAGNOSIS — T1490XA Injury, unspecified, initial encounter: Secondary | ICD-10-CM | POA: Diagnosis not present

## 2020-09-06 DIAGNOSIS — Z515 Encounter for palliative care: Secondary | ICD-10-CM | POA: Diagnosis not present

## 2020-09-06 DIAGNOSIS — J90 Pleural effusion, not elsewhere classified: Secondary | ICD-10-CM

## 2020-09-06 DIAGNOSIS — S22000A Wedge compression fracture of unspecified thoracic vertebra, initial encounter for closed fracture: Secondary | ICD-10-CM | POA: Diagnosis not present

## 2020-09-06 LAB — GLUCOSE, CAPILLARY
Glucose-Capillary: 148 mg/dL — ABNORMAL HIGH (ref 70–99)
Glucose-Capillary: 132 mg/dL — ABNORMAL HIGH (ref 70–99)
Glucose-Capillary: 132 mg/dL — ABNORMAL HIGH (ref 70–99)
Glucose-Capillary: 150 mg/dL — ABNORMAL HIGH (ref 70–99)
Glucose-Capillary: 132 mg/dL — ABNORMAL HIGH (ref 70–99)
Glucose-Capillary: 137 mg/dL — ABNORMAL HIGH (ref 70–99)

## 2020-09-06 MED ORDER — FUROSEMIDE 10 MG/ML IJ SOLN
40.0000 mg | Freq: Once | INTRAMUSCULAR | Status: AC
  Administered 2020-09-06: 40 mg via INTRAVENOUS
  Filled 2020-09-06: qty 4

## 2020-09-06 MED ORDER — IPRATROPIUM-ALBUTEROL 0.5-2.5 (3) MG/3ML IN SOLN
3.0000 mL | Freq: Two times a day (BID) | RESPIRATORY_TRACT | Status: DC
  Administered 2020-09-06 – 2020-09-14 (×16): 3 mL via RESPIRATORY_TRACT
  Filled 2020-09-06 (×15): qty 3

## 2020-09-06 MED ORDER — CLONAZEPAM 1 MG PO TABS
1.0000 mg | ORAL_TABLET | Freq: Two times a day (BID) | ORAL | Status: DC
  Administered 2020-09-06: 1 mg
  Filled 2020-09-06: qty 1

## 2020-09-06 MED ORDER — POTASSIUM CHLORIDE 10 MEQ/50ML IV SOLN
10.0000 meq | INTRAVENOUS | Status: AC
  Administered 2020-09-06 (×2): 10 meq via INTRAVENOUS
  Filled 2020-09-06 (×2): qty 50

## 2020-09-06 MED ORDER — POTASSIUM CHLORIDE 20 MEQ PO PACK
40.0000 meq | PACK | Freq: Once | ORAL | Status: AC
  Administered 2020-09-06: 40 meq
  Filled 2020-09-06: qty 2

## 2020-09-06 NOTE — Progress Notes (Signed)
Physical Therapy Treatment Patient Details Name: Charles Weeks MRN: [id number] DOB: 14-Nov-1965 Today's Date: 09/06/2020    History of Present Illness Pt is a 55 y.o. male who presented 4/1 after falling down about 11 stairs and sustained R PTX, anterior subluxation of R mandibular condyle, multiple bil ribs fxs at junction of thoracic vertebral bodies T5-8 on R and T7-8 on L, 3 column T8 vertebral body fx, T7 neural arch fx, R TVP T4-8 fxs, and R C7 TVP fx through foramen. R chest tube inserted 4/1. Pt found to be hypoxic and was intubated 4/2. No evidence of vascular injury to neck, facial fx, acute intracranial injury, lumbar spine fx, R elbow acute abnormality, or pelvic fx identified on imaging. S/p trach and PEG placement 4/18. S/p L chest tube placement 4/19. PMH: HTN, COPD, CAD, s/p MI with DES RCA in Jan 2020 just on ASA, depression/anxiety, and BPH.    PT Comments    Pt demonstrating good progress this date as he was more awake/alert and following some simple commands. Pt still lethargic, but able to awaken when stimulated. Pt able to communicate through use of thumbs up and down intermittently. He was able to assist with rolling Lt and Rt (max A +2). Pt with decreased sats with HOB being elevated thus transitioned bed to pt's tolerance during session, see Exercises and General Comments below. Will continue to follow acutely. If pt continues to improve he may benefit from intensive therapy in the CIR setting to maximize his independence and safety with all functional mobility as he seemed willing and motivated to assist with rolling this date.   Follow Up Recommendations  CIR;Supervision/Assistance - 24 hour (may change with progression)     Equipment Recommendations  Other (comment) (TBD)    Recommendations for Other Services Rehab consult     Precautions / Restrictions Precautions Precautions: Cervical;Back Precaution Booklet Issued: No Precaution Comments: Bil chest tube,  trach on vent, bil wrist restraints, TLSO donned with rolling if HOB > 30 deg, cervical collar at all times, abdominal binder to protect PEG Required Braces or Orthoses: Cervical Brace;Spinal Brace Cervical Brace: Hard collar;At all times Spinal Brace: Thoracolumbosacral orthotic;Applied in supine position Restrictions Weight Bearing Restrictions: No    Mobility  Bed Mobility Overal bed mobility: Needs Assistance Bed Mobility: Rolling Rolling: Max assist;+2 for physical assistance;+2 for safety/equipment         General bed mobility comments: Pt able to initiate bending each knee to assist with rolling Lt and Rt. HOB elevated to tolerance with use of bed controls.    Transfers                 General transfer comment: unable to attempt  Ambulation/Gait             General Gait Details: Deferred for safety   Stairs             Wheelchair Mobility    Modified Rankin (Stroke Patients Only)       Balance                                            Cognition Arousal/Alertness: Awake/alert;Lethargic Behavior During Therapy: Flat affect Overall Cognitive Status: Impaired/Different from baseline Area of Impairment: Attention;Following commands                   Current Attention Level: Focused;Sustained  Following Commands: Follows one step commands inconsistently;Follows one step commands with increased time       General Comments: Pt was awake, but doses off at times.  He followed one step commands to assist with UE movement, and was able to assist with rolling.  He was intermittently able to give Thumbs up to communicate yes responses      Exercises Other Exercises Other Exercises: HOB elevated to ~60* and pt able to tolerate partially upright ~3 mins before Fi02 decreased to 83%.  He was returned to 20-30*    General Comments General comments (skin integrity, edema, etc.): Sp02 88-90 at rest with pt on 40% Fi02 and  PEEP 8;  Sats dropped to 83% when HOB increased to 60*, and slowly rebounded back to 89% once HOB lowered to 20-30*.  abdomen distended      Pertinent Vitals/Pain Pain Assessment: Faces Faces Pain Scale: Hurts little more Pain Location: unable to provide info.  Grimmacing with HOB elevated Pain Descriptors / Indicators: Grimacing;Restless Pain Intervention(s): Limited activity within patient's tolerance;Monitored during session;Repositioned    Home Living Family/patient expects to be discharged to:: Unsure               Additional Comments: Pt unable to provide info    Prior Function        Comments: Pt unable to provide info.   PT Goals (current goals can now be found in the care plan section) Acute Rehab PT Goals Patient Stated Goal: agreeable to session, did not state PT Goal Formulation: Patient unable to participate in goal setting Time For Goal Achievement: 09/19/20 Potential to Achieve Goals: Fair Progress towards PT goals: Progressing toward goals    Frequency    Min 3X/week      PT Plan Current plan remains appropriate    Co-evaluation PT/OT/SLP Co-Evaluation/Treatment: Yes Reason for Co-Treatment: Necessary to address cognition/behavior during functional activity;For patient/therapist safety;To address functional/ADL transfers PT goals addressed during session: Mobility/safety with mobility OT goals addressed during session: Strengthening/ROM      AM-PAC PT "6 Clicks" Mobility   Outcome Measure  Help needed turning from your back to your side while in a flat bed without using bedrails?: Total Help needed moving from lying on your back to sitting on the side of a flat bed without using bedrails?: Total Help needed moving to and from a bed to a chair (including a wheelchair)?: Total Help needed standing up from a chair using your arms (e.g., wheelchair or bedside chair)?: Total Help needed to walk in hospital room?: Total Help needed climbing 3-5  steps with a railing? : Total 6 Click Score: 6    End of Session Equipment Utilized During Treatment: Cervical collar;Back brace;Oxygen Activity Tolerance: Patient limited by lethargy Patient left: in bed;with call bell/phone within reach;with bed alarm set;with restraints reapplied   PT Visit Diagnosis: Muscle weakness (generalized) (M62.81);Difficulty in walking, not elsewhere classified (R26.2)     Time: 0539-7673 PT Time Calculation (min) (ACUTE ONLY): 45 min  Charges:  $Therapeutic Activity: 8-22 mins                     Raymond Gurney, PT, DPT Acute Rehabilitation Services  Pager: [phone] Office: [phone]    Jewel Baize 09/06/2020, 6:39 PM

## 2020-09-06 NOTE — Progress Notes (Signed)
Daily Progress Note   Patient Name: Charles Weeks       Date: 09/06/2020 DOB: 27-Sep-1965  Age: 55 y.o. MRN#: [id number] Attending Physician: Roslynn Amble, MD Primary Care Physician: No primary care provider on file. Admit Date: 08/17/2020  Reason for Consultation/Follow-up: To discuss complex medical decision making related to patient's goals of care  Visited patient at bedside.  Received detailed information from ICU RN.  Communicated briefly with attending MD.   Since I've seen Charles Weeks a trach and PEG have been placed.  A chest tube for pleural effusion was placed.  He is off of all IV sedation.  Subjective: Patient sleeping - does not wake to voice or gentle exam.    Talked with Charles Weeks.  Expressed that I hoped we would have seen more improvement by now, but that the team is continuing to work with him and support him in every way possible.  She agreed and appreciated the phone contact.  After we hung up Charles Weeks called me back.  She expressed concern over his swollen/painful genitalia and asked what could be done.  Per RN patient is being diuresed and requiring in/out cath q 6 hours.   Assessment: 55 yo male who suffered spinal & rib fractures after a fall over a stair railing.  Patient requires ventilator support, tolerating artificial feeding,was able to wake and follow some commands but having some difficulty with agitation on the vent.  Off IV sedation, but too sleepy on BID Klonopin and Seroquel at current doses.  Primary team adjusting.  Being diuresed for swollen genitalia.   Patient Profile/HPI:  55 y.o. male with past medical history of COPD gold, CAD, depression and anxiety who was admitted on 08/17/2020 after a fall from a significant height.  He was noted to have a large right  pneumothorax requiring chest tube placement, multiple bilateral rib fractures, T8 fracture, C7 fracture.  Neurosurgery evaluated the patient and felt the fractures were non-operable.  Charles Weeks remains intubated.  He has had difficulty with thick secretions and agitation on the vent requiring propofol.  We were consulted for goals of care.      Length of Stay: 20   Vital Signs: BP 108/75   Pulse 96   Temp 98.7 F (37.1 C) (Axillary)   Resp (!) 21   Ht 5\' 8"  (  1.727 m)   Wt 100.6 kg   SpO2 (!) 89%   BMI 33.72 kg/m  SpO2: SpO2: (!) 89 % O2 Device: O2 Device: Ventilator O2 Flow Rate: O2 Flow Rate (L/min): 10 L/min       Palliative Assessment/Data:  10%     Palliative Care Plan    Recommendations/Plan: PMT will continue to follow with you  Will continue to intermittently touch base with family. Please do not hesitate to call our office if our assistance is needed more urgently.  Code Status:  Full code  Prognosis:  Unable to determine Charles Weeks is at high risk for acute decompensation and death given respiratory failure (COPD gold), and spinal fracture.  Discharge Planning: To Be Determined  Care plan was discussed with ICU RN and daughter.    Thank you for allowing the Palliative Medicine Team to assist in the care of this patient.  Total time spent:  35 min.     Greater than 50%  of this time was spent counseling and coordinating care related to the above assessment and plan.  Norvel Richards, PA-C Palliative Medicine  Please contact Palliative MedicineTeam phone at [phone] for questions and concerns between 7 am - 7 pm.   Please see AMION for individual provider pager numbers.

## 2020-09-06 NOTE — Evaluation (Signed)
Occupational Therapy Evaluation Patient Details Name: Charles Weeks MRN: [id number] DOB: 08/26/1965 Today's Date: 09/06/2020    History of Present Illness Pt is a 55 y.o. male who presented 4/1 after falling down about 11 stairs and sustained R PTX, anterior subluxation of R mandibular condyle, multiple bil ribs fxs at junction of thoracic vertebral bodies T5-8 on R and T7-8 on L, 3 column T8 vertebral body fx, T7 neural arch fx, R TVP T4-8 fxs, and R C7 TVP fx through foramen. R chest tube inserted 4/1. Pt found to be hypoxic and was intubated 4/2. No evidence of vascular injury to neck, facial fx, acute intracranial injury, lumbar spine fx, R elbow acute abnormality, or pelvic fx identified on imaging. S/p trach and PEG placement 4/18. S/p L chest tube placement 4/19. PMH: HTN, COPD, CAD, s/p MI with DES RCA in Jan 2020 just on ASA, depression/anxiety, and BPH.   Clinical Impression   Pt admitted with above. He demonstrates the below listed deficits and will benefit from continued OT to maximize safety and independence with BADLs.  PT presents to OT with generalized weakness, decreased activity tolerance, impaired cognition, increased edema.  He currently requires total A for all aspects of ADLs. He was able to assist with rolling Lt and Rt (max A +2), and was able to follow one step simple commands intermittently.  He PLOF and living situation are unknown as he was unable to provide that info.  Anticipate he will require extensive post acute rehab and disposition will be dependent on his ability to wean from vent.       Follow Up Recommendations  LTACH;SNF    Equipment Recommendations  None recommended by OT    Recommendations for Other Services       Precautions / Restrictions Precautions Precautions: Cervical;Back Precaution Booklet Issued: No Precaution Comments: Bil chest tube, trach on vent, bil wrist restraints, TLSO donned with rolling if HOB > 30 deg, cervical collar at all  times, abdominal binder to protect PEG Required Braces or Orthoses: Cervical Brace;Spinal Brace Cervical Brace: Hard collar;At all times Spinal Brace: Thoracolumbosacral orthotic;Applied in supine position      Mobility Bed Mobility Overal bed mobility: Needs Assistance Bed Mobility: Rolling Rolling: Max assist;+2 for physical assistance;+2 for safety/equipment         General bed mobility comments: Pt able to initiate bending each knee to assist with rolling Lt and Rt    Transfers                 General transfer comment: unable to attempt    Balance                                           ADL either performed or assessed with clinical judgement   ADL                                         General ADL Comments: Pt requires total A for all aspects     Vision   Additional Comments: Pt will look to the Rt and does visually fixate on the Rt.  He will fixate on the Lt intermittently with cuing     Perception     Praxis      Pertinent Vitals/Pain Pain Assessment: Faces Faces  Pain Scale: Hurts little more Pain Location: unable to provide info.  Grimmacing with HOB elevated Pain Descriptors / Indicators: Grimacing;Restless Pain Intervention(s): Monitored during session;Limited activity within patient's tolerance;Repositioned     Hand Dominance  (uncertain)   Extremity/Trunk Assessment Upper Extremity Assessment Upper Extremity Assessment: RUE deficits/detail;LUE deficits/detail RUE Deficits / Details: AAROM WFL. Strength grossly 1+-2/5 throughout.  Edema noted RUE Coordination: decreased gross motor;decreased fine motor LUE Deficits / Details: AAROM WFL. Strength grossly 1+-2/5 throughout.  Edema noted LUE Coordination: decreased gross motor;decreased fine motor   Lower Extremity Assessment Lower Extremity Assessment: Defer to PT evaluation   Cervical / Trunk Assessment Cervical / Trunk Assessment: Other  exceptions Cervical / Trunk Exceptions: fxs with TLSO and cervical brace   Communication Communication Communication: Tracheostomy;Other (comment)   Cognition Arousal/Alertness: Awake/alert;Lethargic Behavior During Therapy: Flat affect Overall Cognitive Status: Impaired/Different from baseline Area of Impairment: Attention;Following commands                   Current Attention Level: Focused;Sustained   Following Commands: Follows one step commands inconsistently;Follows one step commands with increased time       General Comments: Pt was awake, but doses off at times.  He followed one step commands to assist with UE movement, and was able to assist with rolling.  He was intermittently able to give Thumbs up to communicate yes responses   General Comments  Sp02 88-90 at rest with pt on 40% Fi02 and PEEP 8;  Sats dropped to 83% when HOB increased to 60*, and slowly rebounded back to 89% once HOB lowered to 20-30*.  abdomen distended    Exercises Other Exercises Other Exercises: HOB elevated to ~60* and pt able to tolerate partially upright ~3 mins before Fi02 decreased to 83%.  He was returned to 20-30*   Shoulder Instructions      Home Living Family/patient expects to be discharged to:: Unsure                                 Additional Comments: Pt unable to provide info      Prior Functioning/Environment          Comments: Pt unable to provide info.        OT Problem List: Decreased strength;Decreased range of motion;Decreased activity tolerance;Impaired balance (sitting and/or standing);Decreased coordination;Decreased cognition;Decreased safety awareness;Decreased knowledge of use of DME or AE;Decreased knowledge of precautions;Cardiopulmonary status limiting activity;Obesity;Impaired UE functional use;Increased edema;Pain      OT Treatment/Interventions: Self-care/ADL training;Therapeutic exercise;Energy conservation;DME and/or AE  instruction;Therapeutic activities;Cognitive remediation/compensation;Visual/perceptual remediation/compensation;Patient/family education;Balance training    OT Goals(Current goals can be found in the care plan section) Acute Rehab OT Goals OT Goal Formulation: Patient unable to participate in goal setting Time For Goal Achievement: 09/20/20 Potential to Achieve Goals: Good ADL Goals Pt Will Perform Grooming: sitting;bed level;with mod assist Additional ADL Goal #1: Pt will consistently follow 1 step commands ` Additional ADL Goal #2: Pt will tolerate upright position - EOB sitting, recliner, or chair egress x 10 mins with VSS in prep for ADLs  OT Frequency: Min 2X/week   Barriers to D/C:            Co-evaluation PT/OT/SLP Co-Evaluation/Treatment: Yes Reason for Co-Treatment: Necessary to address cognition/behavior during functional activity;For patient/therapist safety   OT goals addressed during session: Strengthening/ROM      AM-PAC OT "6 Clicks" Daily Activity     Outcome Measure Help  from another person eating meals?: Total Help from another person taking care of personal grooming?: Total Help from another person toileting, which includes using toliet, bedpan, or urinal?: Total Help from another person bathing (including washing, rinsing, drying)?: Total Help from another person to put on and taking off regular upper body clothing?: Total Help from another person to put on and taking off regular lower body clothing?: Total 6 Click Score: 6   End of Session Equipment Utilized During Treatment: Oxygen;Back brace Nurse Communication: Mobility status  Activity Tolerance: Treatment limited secondary to medical complications (Comment) Patient left: in bed;with call bell/phone within reach  OT Visit Diagnosis: Muscle weakness (generalized) (M62.81)                Time: 9163-8466 OT Time Calculation (min): 45 min Charges:  OT General Charges $OT Visit: 1 Visit OT  Evaluation $OT Eval High Complexity: 1 High OT Treatments $Therapeutic Activity: 8-22 mins  Eber Jones., OTR/L Acute Rehabilitation Services Pager [phone] Office [phone]   Jeani Hawking M 09/06/2020, 6:27 PM

## 2020-09-06 NOTE — Progress Notes (Signed)
Patient ID: Charles Weeks, male   DOB: 02/20/1966, 55 y.o.   MRN: [id number] Follow up - Trauma Critical Care  Patient Details:    Charles Weeks is an 55 y.o. male.  Lines/tubes : CVC Triple Lumen 08/18/20 Right Subclavian (Active)  Indication for Insertion or Continuance of Line Poor Vasculature-patient has had multiple peripheral attempts or PIVs lasting less than 24 hours 09/06/20 0800  Site Assessment Dry;Clean;Intact 09/06/20 0800  Proximal Lumen Status Infusing 09/06/20 0800  Medial Lumen Status Saline locked;Flushed 09/06/20 0800  Distal Lumen Status In-line blood sampling system in place 09/06/20 0800  Dressing Type Transparent;Occlusive 09/06/20 0800  Dressing Status Clean;Intact;Dry 09/06/20 0800  Antimicrobial disc in place? Yes 09/06/20 0800  Line Care Connections checked and tightened 09/06/20 0800  Dressing Intervention Dressing changed 09/02/20 2300  Dressing Change Due 09/09/20 09/06/20 0800     Chest Tube Lateral;Right (Active)  Status To water seal 09/06/20 0800  Chest Tube Air Leak None 09/06/20 0800  Patency Intervention Tip/tilt 09/06/20 0800  Drainage Description Serosanguineous 09/06/20 0800  Dressing Status Clean;Dry;Intact 09/06/20 0800  Dressing Intervention New dressing 09/06/20 0800  Site Assessment Clean;Dry;Intact 09/06/20 0800  Surrounding Skin Dry;Other (Comment) 09/04/20 2000  Output (mL) 50 mL 09/06/20 0600     Chest Tube Lateral;Left 20 Fr. (Active)  Status To water seal 09/06/20 0800  Chest Tube Air Leak None 09/06/20 0800  Patency Intervention Tip/tilt 09/06/20 0800  Drainage Description Dark red 09/06/20 0800  Dressing Status Clean;Dry;Intact 09/06/20 0800  Dressing Intervention New dressing 09/06/20 0800  Site Assessment Clean;Dry;Intact 09/06/20 0800  Output (mL) 50 mL 09/06/20 0600     Gastrostomy/Enterostomy Percutaneous endoscopic gastrostomy (PEG) 20 Fr. LUQ (Active)  Surrounding Skin Dry;Intact 09/06/20 0800  Tube Status  Patent 09/06/20 0800  Drainage Appearance None 09/06/20 0800  Dressing Status Clean;Dry;Intact 09/06/20 0800     External Urinary Catheter (Active)  Collection Container Dedicated Suction Canister 09/06/20 0800  Output (mL) 0 mL 09/06/20 0500    Microbiology/Sepsis markers: Results for orders placed or performed during the hospital encounter of 08/17/20  Resp Panel by RT-PCR (Flu A&B, Covid) Nasopharyngeal Swab     Status: None   Collection Time: 08/17/20  3:40 PM   Specimen: Nasopharyngeal Swab; Nasopharyngeal(NP) swabs in vial transport medium  Result Value Ref Range Status   SARS Coronavirus 2 by RT PCR NEGATIVE NEGATIVE Final    Comment: (NOTE) SARS-CoV-2 target nucleic acids are NOT DETECTED.  The SARS-CoV-2 RNA is generally detectable in upper respiratory specimens during the acute phase of infection. The lowest concentration of SARS-CoV-2 viral copies this assay can detect is 138 copies/mL. A negative result does not preclude SARS-Cov-2 infection and should not be used as the sole basis for treatment or other patient management decisions. A negative result may occur with  improper specimen collection/handling, submission of specimen other than nasopharyngeal swab, presence of viral mutation(s) within the areas targeted by this assay, and inadequate number of viral copies(<138 copies/mL). A negative result must be combined with clinical observations, patient history, and epidemiological information. The expected result is Negative.  Fact Sheet for Patients:  BloggerCourse.com  Fact Sheet for Healthcare Providers:  SeriousBroker.it  This test is no t yet approved or cleared by the [place] FDA and  has been authorized for detection and/or diagnosis of SARS-CoV-2 by FDA under an Emergency Use Authorization (EUA). This EUA will remain  in effect (meaning this test can be used) for the duration of the COVID-19  declaration under Section 564(b)(1)  of the Act, 21 U.S.C.section 360bbb-3(b)(1), unless the authorization is terminated  or revoked sooner.       Influenza A by PCR NEGATIVE NEGATIVE Final   Influenza B by PCR NEGATIVE NEGATIVE Final    Comment: (NOTE) The Xpert Xpress SARS-CoV-2/FLU/RSV plus assay is intended as an aid in the diagnosis of influenza from Nasopharyngeal swab specimens and should not be used as a sole basis for treatment. Nasal washings and aspirates are unacceptable for Xpert Xpress SARS-CoV-2/FLU/RSV testing.  Fact Sheet for Patients: BloggerCourse.com  Fact Sheet for Healthcare Providers: SeriousBroker.it  This test is not yet approved or cleared by the [place] FDA and has been authorized for detection and/or diagnosis of SARS-CoV-2 by FDA under an Emergency Use Authorization (EUA). This EUA will remain in effect (meaning this test can be used) for the duration of the COVID-19 declaration under Section 564(b)(1) of the Act, 21 U.S.C. section 360bbb-3(b)(1), unless the authorization is terminated or revoked.  Performed at [facility], [address], [place], [place] [address]   Surgical PCR screen     Status: None   Collection Time: 08/19/20  7:38 AM   Specimen: Nasal Mucosa; Nasal Swab  Result Value Ref Range Status   MRSA, PCR NEGATIVE NEGATIVE Final   Staphylococcus aureus NEGATIVE NEGATIVE Final    Comment: (NOTE) The Xpert SA Assay (FDA approved for NASAL specimens in patients 58 years of age and older), is one component of a comprehensive surveillance program. It is not intended to diagnose infection nor to guide or monitor treatment. Performed at [facility], [address], [place], [place] [address]     Anti-infectives:  Anti-infectives (From admission, onward)   Start     Dose/Rate Route Frequency Ordered Stop   08/19/20 0845  cefTRIAXone (ROCEPHIN) 2 g in sodium  chloride 0.9 % 100 mL IVPB  Status:  Discontinued        2 g 200 mL/hr over 30 Minutes Intravenous Every 24 hours 08/19/20 0753 08/25/20 1304   08/17/20 1515  ceFAZolin (ANCEF) IVPB 2g/100 mL premix        2 g 200 mL/hr over 30 Minutes Intravenous  Once 08/17/20 1500 08/17/20 1530     Consults: Treatment Team:  Tia Alert, MD    Studies:    Events:  Subjective:    Overnight Issues:   Objective:  Vital signs for last 24 hours: Temp:  [98.5 F (36.9 C)-100.9 F (38.3 C)] 98.7 F (37.1 C) (04/21 1140) Pulse Rate:  [90-121] 91 (04/21 1200) Resp:  [14-28] 17 (04/21 1200) BP: (104-156)/(61-103) 104/68 (04/21 1200) SpO2:  [87 %-94 %] 90 % (04/21 1200) FiO2 (%):  [40 %] 40 % (04/21 1054)  Hemodynamic parameters for last 24 hours:    Intake/Output from previous day: 04/20 0701 - 04/21 0700 In: 2787.7 [I.V.:7.7; NG/GT:2780] Out: 3330 [Urine:3100; Chest Tube:230]  Intake/Output this shift: Total I/O In: 400 [NG/GT:400] Out: -   Vent settings for last 24 hours: Vent Mode: PRVC FiO2 (%):  [40 %] 40 % Set Rate:  [18 bmp] 18 bmp Vt Set:  [480 mL] 480 mL PEEP:  [8 cmH20] 8 cmH20 Plateau Pressure:  [15 cmH20-23 cmH20] 16 cmH20  Physical Exam:  General: on vent Neuro: sedated now HEENT/Neck: trach-clean, intact Resp: clear to auscultation bilaterally CVS: RRR GI: soft, NT, PEG site OK Extremities: edema 1+  Results for orders placed or performed during the hospital encounter of 08/17/20 (from the past 24 hour(s))  Glucose, capillary     Status: Abnormal   Collection Time: 09/05/20  4:12 PM  Result Value Ref Range   Glucose-Capillary 150 (H) 70 - 99 mg/dL  Glucose, capillary     Status: Abnormal   Collection Time: 09/05/20  7:29 PM  Result Value Ref Range   Glucose-Capillary 149 (H) 70 - 99 mg/dL  Glucose, capillary     Status: Abnormal   Collection Time: 09/05/20 11:51 PM  Result Value Ref Range   Glucose-Capillary 142 (H) 70 - 99 mg/dL  Glucose,  capillary     Status: Abnormal   Collection Time: 09/06/20  4:04 AM  Result Value Ref Range   Glucose-Capillary 148 (H) 70 - 99 mg/dL  Glucose, capillary     Status: Abnormal   Collection Time: 09/06/20  7:47 AM  Result Value Ref Range   Glucose-Capillary 132 (H) 70 - 99 mg/dL  Glucose, capillary     Status: Abnormal   Collection Time: 09/06/20 11:25 AM  Result Value Ref Range   Glucose-Capillary 132 (H) 70 - 99 mg/dL    Assessment & Plan: Present on Admission: . Thoracic spine fracture (HCC)    LOS: 20 days   Additional comments:I reviewed the patient's new clinical lab test results. . Fall from height   R PTX - CT in place on water seal, 110cc/24h, continue today Enlarging L pleural effusion - L chest tube placed 4/19, to WS today, monitor o/p Multiple B rib fxs - these are at the junction of the thoracic vertebral bodies, pain control, IS 3 column T8 vertebral body fx - per Dr. Yetta Barre, non-op now, plan mobilize in brace once off vent. Needs CTO brace for HOB>30 degrees. I D/W Dr. Yetta Barre this AM. T7 neural arch fx - per nsgy R TVP T4-8 - pain control, per nsgy R C7 TVP fx through foramen - C collar per Dr. Yetta Barre, CTA negatvie Large scalp laceration/right eyebrow laceration - repair by Dr. Bedelia Person 08/17/2020, sutures removed Right neck abrasion/skin tear - bacitracin BID Facial ecchymosis - CT maxillofacial demonstrates no facial bone fractures.  Some anterior subluxation of the right mandibular condyle HTN - home lopressor 25mg  daily CAD/H/O MI/DES - on ASA at home.  Hold for now.  Monitor, on tele Acute hypercarbic ventilator dependent respiratory failure/COPD - 40% and PEEP8. Wean as tolerated to maintain sats >88%. Solumedrol x5d per CCM recs, restarted home inhalers 4/8. Poor weaning. Trach/PEG 4/18 by Dr. 5/18 Rash on back - benadryl Hypotension - resolved Depression/anxiety - restart home bupropion when taking PO, restarted home buspar BPH - home terazosin, repeatedly  failed voiding trials, increased bethanachol 4/15. I&O q6h ABL anemia - stable FEN - TF, bowel regimen, lasix x1 today, decrease klonopin VTE - LMWH Dispo - ICU Critical Care Total Time*: 35 Minutes  5/15, MD, MPH, FACS Trauma & General Surgery Use [email or web address] to contact on call provider  09/06/2020  *Care during the described time interval was provided by me. I have reviewed this patient's available data, including medical history, events of note, physical examination and test results as part of my evaluation.

## 2020-09-06 NOTE — Progress Notes (Signed)
SLP Cancellation Note  Patient Details Name: Charles Weeks MRN: [id number] DOB: 1965/08/03   Cancelled treatment:       Reason Eval/Treat Not Completed: Patient's level of consciousness;Medical issues which prohibited therapy. Pt received scheduled, sedating meds due to restlessness. Remains on mechanical ventilation support. Will follow for readiness for PMSV evaluation.   Ardyth Gal MA, CCC-SLP Acute Rehabilitation Services   09/06/2020, 10:35 AM

## 2020-09-06 NOTE — Progress Notes (Signed)
Spoke with Trauma MD Janee Morn regarding necessity of Central line.  Was told that patient was so edematous that they were unable to secure peripheral access and that we would continue to use the central line.

## 2020-09-07 ENCOUNTER — Inpatient Hospital Stay (HOSPITAL_COMMUNITY): Payer: Medicaid Other

## 2020-09-07 DIAGNOSIS — T1490XA Injury, unspecified, initial encounter: Secondary | ICD-10-CM | POA: Diagnosis not present

## 2020-09-07 DIAGNOSIS — S22069A Unspecified fracture of T7-T8 vertebra, initial encounter for closed fracture: Secondary | ICD-10-CM

## 2020-09-07 DIAGNOSIS — J939 Pneumothorax, unspecified: Secondary | ICD-10-CM | POA: Diagnosis not present

## 2020-09-07 LAB — CBC
HCT: 27.3 % — ABNORMAL LOW (ref 39.0–52.0)
Hemoglobin: 8.2 g/dL — ABNORMAL LOW (ref 13.0–17.0)
MCH: 30.3 pg (ref 26.0–34.0)
MCHC: 30 g/dL (ref 30.0–36.0)
MCV: 100.7 fL — ABNORMAL HIGH (ref 80.0–100.0)
Platelets: 478 10*3/uL — ABNORMAL HIGH (ref 150–400)
RBC: 2.71 MIL/uL — ABNORMAL LOW (ref 4.22–5.81)
RDW: 16.4 % — ABNORMAL HIGH (ref 11.5–15.5)
WBC: 12.2 10*3/uL — ABNORMAL HIGH (ref 4.0–10.5)
nRBC: 0 % (ref 0.0–0.2)

## 2020-09-07 LAB — BASIC METABOLIC PANEL
Anion gap: 6 (ref 5–15)
BUN: 20 mg/dL (ref 6–20)
CO2: 36 mmol/L — ABNORMAL HIGH (ref 22–32)
Calcium: 8.8 mg/dL — ABNORMAL LOW (ref 8.9–10.3)
Chloride: 100 mmol/L (ref 98–111)
Creatinine, Ser: 0.73 mg/dL (ref 0.61–1.24)
GFR, Estimated: >60 mL/min (ref >60)
Glucose, Bld: 116 mg/dL — ABNORMAL HIGH (ref 70–99)
Potassium: 3.8 mmol/L (ref 3.5–5.1)
Sodium: 142 mmol/L (ref 135–145)

## 2020-09-07 LAB — GLUCOSE, CAPILLARY
Glucose-Capillary: 125 mg/dL — ABNORMAL HIGH (ref 70–99)
Glucose-Capillary: 122 mg/dL — ABNORMAL HIGH (ref 70–99)
Glucose-Capillary: 135 mg/dL — ABNORMAL HIGH (ref 70–99)
Glucose-Capillary: 92 mg/dL (ref 70–99)
Glucose-Capillary: 151 mg/dL — ABNORMAL HIGH (ref 70–99)
Glucose-Capillary: 159 mg/dL — ABNORMAL HIGH (ref 70–99)

## 2020-09-07 MED ORDER — POTASSIUM CHLORIDE 20 MEQ PO PACK
40.0000 meq | PACK | Freq: Once | ORAL | Status: AC
  Administered 2020-09-07: 40 meq via ORAL
  Filled 2020-09-07: qty 2

## 2020-09-07 MED ORDER — MORPHINE SULFATE (PF) 4 MG/ML IV SOLN
4.0000 mg | INTRAVENOUS | Status: DC | PRN
  Administered 2020-09-07 – 2020-10-08 (×48): 4 mg via INTRAVENOUS
  Filled 2020-09-07 (×51): qty 1

## 2020-09-07 MED ORDER — CLONAZEPAM 0.5 MG PO TABS
0.5000 mg | ORAL_TABLET | Freq: Two times a day (BID) | ORAL | Status: DC
  Administered 2020-09-07 – 2020-09-19 (×25): 0.5 mg
  Filled 2020-09-07 (×25): qty 1

## 2020-09-07 MED ORDER — QUETIAPINE FUMARATE 25 MG PO TABS
150.0000 mg | ORAL_TABLET | Freq: Three times a day (TID) | ORAL | Status: DC
  Administered 2020-09-07 – 2020-09-19 (×37): 150 mg
  Filled 2020-09-07 (×37): qty 2

## 2020-09-07 MED ORDER — FUROSEMIDE 10 MG/ML IJ SOLN
40.0000 mg | Freq: Once | INTRAMUSCULAR | Status: AC
  Administered 2020-09-07: 40 mg via INTRAVENOUS
  Filled 2020-09-07: qty 4

## 2020-09-07 NOTE — Progress Notes (Signed)
Inpatient Rehabilitation Admissions Coordinator   Patient currently not at a level to consider for CIR admit. We will follow his tolerance with therapies.  Ottie Glazier, RN, MSN Rehab Admissions Coordinator [phone] 09/07/2020 2:21 PM

## 2020-09-07 NOTE — Progress Notes (Signed)
                                                                                                                                                                                                           Daily Progress Note   Patient Name: Charles Weeks       Date: 09/07/2020 DOB: November 05, 1965  Age: 55 y.o. MRN#: [id number] Attending Physician: Roslynn Amble, MD Primary Care Physician: No primary care provider on file. Admit Date: 08/17/2020  Reason for Consultation/Follow-up:  To discuss complex medical decision making related to patient's goals of care  Patient showing improvement today - able to wean for over 5 hours, swelling in genitalia decreased.  More responsive with klonopin and Seroquel decreased.  Still requires in/out cath q 6 hours.   Subjective: Attempted to call [name] twice.  Unable to leave voice mail.   Vital Signs: BP 116/62 (BP Location: Left Arm)   Pulse 90   Temp 98.7 F (37.1 C) (Axillary)   Resp 17   Ht 5\' 8"  (1.727 m)   Wt 98.8 kg   SpO2 93%   BMI 33.12 kg/m  SpO2: SpO2: 93 % O2 Device: O2 Device: Ventilator O2 Flow Rate: O2 Flow Rate (L/min): 10 L/min       Palliative Assessment/Data: 10%     Palliative Care Plan    Recommendations/Plan: PMT will continue to follow with you. Intermittently touch base with family for support. Will intervene more actively if patient declines.  Code Status:  Full code  Prognosis:  Unable to determine   Discharge Planning: To Be Determined  Thank you for allowing the Palliative Medicine Team to assist in the care of this patient.  Total time spent:   15 min.     Greater than 50%  of this time was spent counseling and coordinating care related to the above assessment and plan.  , PA-C Palliative Medicine  Please contact Palliative MedicineTeam phone at [phone] for questions and concerns between 7 am - 7 pm.   Please see AMION for individual provider pager numbers.

## 2020-09-08 LAB — BASIC METABOLIC PANEL
Anion gap: 9 (ref 5–15)
BUN: 19 mg/dL (ref 6–20)
CO2: 35 mmol/L — ABNORMAL HIGH (ref 22–32)
Calcium: 8.7 mg/dL — ABNORMAL LOW (ref 8.9–10.3)
Chloride: 96 mmol/L — ABNORMAL LOW (ref 98–111)
Creatinine, Ser: 0.71 mg/dL (ref 0.61–1.24)
GFR, Estimated: >60 mL/min (ref >60)
Glucose, Bld: 137 mg/dL — ABNORMAL HIGH (ref 70–99)
Potassium: 3.5 mmol/L (ref 3.5–5.1)
Sodium: 140 mmol/L (ref 135–145)

## 2020-09-08 LAB — CBC
HCT: 26.8 % — ABNORMAL LOW (ref 39.0–52.0)
Hemoglobin: 8.1 g/dL — ABNORMAL LOW (ref 13.0–17.0)
MCH: 30.2 pg (ref 26.0–34.0)
MCHC: 30.2 g/dL (ref 30.0–36.0)
MCV: 100 fL (ref 80.0–100.0)
Platelets: 501 10*3/uL — ABNORMAL HIGH (ref 150–400)
RBC: 2.68 MIL/uL — ABNORMAL LOW (ref 4.22–5.81)
RDW: 16.2 % — ABNORMAL HIGH (ref 11.5–15.5)
WBC: 10.9 10*3/uL — ABNORMAL HIGH (ref 4.0–10.5)
nRBC: 0 % (ref 0.0–0.2)

## 2020-09-08 LAB — GLUCOSE, CAPILLARY
Glucose-Capillary: 120 mg/dL — ABNORMAL HIGH (ref 70–99)
Glucose-Capillary: 139 mg/dL — ABNORMAL HIGH (ref 70–99)
Glucose-Capillary: 143 mg/dL — ABNORMAL HIGH (ref 70–99)
Glucose-Capillary: 132 mg/dL — ABNORMAL HIGH (ref 70–99)
Glucose-Capillary: 140 mg/dL — ABNORMAL HIGH (ref 70–99)
Glucose-Capillary: 136 mg/dL — ABNORMAL HIGH (ref 70–99)

## 2020-09-08 MED ORDER — FUROSEMIDE 10 MG/ML IJ SOLN
40.0000 mg | Freq: Once | INTRAMUSCULAR | Status: AC
  Administered 2020-09-08: 40 mg via INTRAVENOUS
  Filled 2020-09-08: qty 4

## 2020-09-08 MED ORDER — LORAZEPAM 2 MG/ML IJ SOLN
1.0000 mg | INTRAMUSCULAR | Status: DC | PRN
  Administered 2020-09-08 – 2020-09-28 (×74): 1 mg via INTRAVENOUS
  Filled 2020-09-08 (×75): qty 1

## 2020-09-08 MED ORDER — LORAZEPAM 2 MG/ML IJ SOLN
INTRAMUSCULAR | Status: AC
  Filled 2020-09-08: qty 1

## 2020-09-08 NOTE — Progress Notes (Signed)
Follow up - Trauma and Critical Care  Patient Details:    Charles Weeks is an 55 y.o. male.  Anti-infectives:  Anti-infectives (From admission, onward)   Start     Dose/Rate Route Frequency Ordered Stop   08/19/20 0845  cefTRIAXone (ROCEPHIN) 2 g in sodium chloride 0.9 % 100 mL IVPB  Status:  Discontinued        2 g 200 mL/hr over 30 Minutes Intravenous Every 24 hours 08/19/20 0753 08/25/20 1304   08/17/20 1515  ceFAZolin (ANCEF) IVPB 2g/100 mL premix        2 g 200 mL/hr over 30 Minutes Intravenous  Once 08/17/20 1500 08/17/20 1530      Consults: Treatment Team:  Tia Alert, MD   Chief Complaint/Subjective:    Overnight Issues: No acute events  Objective:  Vital signs for last 24 hours: Temp:  [98.7 F (37.1 C)-99.8 F (37.7 C)] 98.9 F (37.2 C) (04/23 0400) Pulse Rate:  [86-121] 109 (04/23 0700) Resp:  [16-24] 19 (04/23 0700) BP: (106-158)/(54-101) 143/77 (04/23 0700) SpO2:  [88 %-99 %] 94 % (04/23 0700) FiO2 (%):  [40 %] 40 % (04/23 0340) Weight:  [94.7 kg] 94.7 kg (04/23 0400)  Hemodynamic parameters for last 24 hours:    Intake/Output from previous day: 04/22 0701 - 04/23 0700 In: 4080 [NG/GT:4080] Out: 3820 [Urine:3260; Chest Tube:560]  Intake/Output this shift: No intake/output data recorded.  Vent settings for last 24 hours: Vent Mode: PRVC FiO2 (%):  [40 %] 40 % Set Rate:  [18 bmp] 18 bmp Vt Set:  [480 mL] 480 mL PEEP:  [8 cmH20] 8 cmH20 Pressure Support:  [8 cmH20] 8 cmH20 Plateau Pressure:  [18 cmH20-20 cmH20] 20 cmH20  Physical Exam:  Gen: NAD HEENT: trach clean Resp: assisted, clear to ausculation Cardiovascular: tachycardic Abdomen: soft, NT, ND Ext: trace b/l edema Neuro: moves all extremities, sedated   Assessment/Plan:   Fall from height  R PTX- CT in place on water seal,500cc/24h,continue today Enlarging L pleural effusion- L chest tube placed 4/19, to WS today, monitor o/p Multiple B rib fxs- these are at the  junction of the thoracic vertebral bodies, pain control, IS 3 column T8 vertebral body fx-perDr. Jones,non-op now, plan mobilize in brace once off vent. Needs CTO brace for HOB>30 degrees. I D/W Dr. Yetta Barre this AM. T7 neural arch fx- per nsgy R TVP T4-8 - pain control, per nsgy R C7 TVP fx through foramen- C collar per Dr. Yetta Barre, CTAnegatvie Large scalp laceration/right eyebrow laceration- repair by Dr. Lovick4/05/2020, sutures removed Right neck abrasion/skin tear- bacitracin BID Facial ecchymosis- CT maxillofacialdemonstrates no facial bone fractures. Some anterior subluxation of the right mandibular condyle HTN- home lopressor 25mg  daily CAD/H/O MI/DES- on ASA at home. Hold for now. 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 Poor weaning. Trach/PEG 4/18by Dr. 03-05-1974 Rash on back- benadryl Hypotension- resolved Depression/anxiety- restart home bupropion whentaking PO, restarted home buspar BPH- home terazosin, repeatedly failed voiding trials, increased bethanachol 4/15. I&O q6h ABL anemia- stable FEN-TF,bowel regimen,lasix x1 today VTE-[facility] Dispo- ICU   LOS: 22 days    5/15 Charles Weeks 09/08/2020  *Care during the described time interval was provided by me and/or other providers on the critical care team.  I have reviewed this patient's available data, including medical history, events of note, physical examination and test results as part of my evaluation.

## 2020-09-09 ENCOUNTER — Inpatient Hospital Stay (HOSPITAL_COMMUNITY): Payer: Medicaid Other

## 2020-09-09 LAB — CBC
HCT: 24.7 % — ABNORMAL LOW (ref 39.0–52.0)
Hemoglobin: 7.7 g/dL — ABNORMAL LOW (ref 13.0–17.0)
MCH: 30.6 pg (ref 26.0–34.0)
MCHC: 31.2 g/dL (ref 30.0–36.0)
MCV: 98 fL (ref 80.0–100.0)
Platelets: 460 10*3/uL — ABNORMAL HIGH (ref 150–400)
RBC: 2.52 MIL/uL — ABNORMAL LOW (ref 4.22–5.81)
RDW: 15.9 % — ABNORMAL HIGH (ref 11.5–15.5)
WBC: 13.9 10*3/uL — ABNORMAL HIGH (ref 4.0–10.5)
nRBC: 0 % (ref 0.0–0.2)

## 2020-09-09 LAB — GLUCOSE, CAPILLARY
Glucose-Capillary: 120 mg/dL — ABNORMAL HIGH (ref 70–99)
Glucose-Capillary: 140 mg/dL — ABNORMAL HIGH (ref 70–99)
Glucose-Capillary: 145 mg/dL — ABNORMAL HIGH (ref 70–99)
Glucose-Capillary: 152 mg/dL — ABNORMAL HIGH (ref 70–99)
Glucose-Capillary: 152 mg/dL — ABNORMAL HIGH (ref 70–99)
Glucose-Capillary: 159 mg/dL — ABNORMAL HIGH (ref 70–99)

## 2020-09-09 LAB — BASIC METABOLIC PANEL
Anion gap: 7 (ref 5–15)
BUN: 15 mg/dL (ref 6–20)
CO2: 34 mmol/L — ABNORMAL HIGH (ref 22–32)
Calcium: 7.9 mg/dL — ABNORMAL LOW (ref 8.9–10.3)
Chloride: 95 mmol/L — ABNORMAL LOW (ref 98–111)
Creatinine, Ser: 0.58 mg/dL — ABNORMAL LOW (ref 0.61–1.24)
GFR, Estimated: >60 mL/min (ref >60)
Glucose, Bld: 142 mg/dL — ABNORMAL HIGH (ref 70–99)
Potassium: 3.3 mmol/L — ABNORMAL LOW (ref 3.5–5.1)
Sodium: 136 mmol/L (ref 135–145)

## 2020-09-09 MED ORDER — POTASSIUM CHLORIDE 20 MEQ PO PACK
40.0000 meq | PACK | Freq: Once | ORAL | Status: AC
  Administered 2020-09-09: 40 meq
  Filled 2020-09-09: qty 2

## 2020-09-09 MED ORDER — CHLORHEXIDINE GLUCONATE 0.12 % MT SOLN
OROMUCOSAL | Status: AC
  Filled 2020-09-09: qty 15

## 2020-09-09 NOTE — Progress Notes (Signed)
RT NOTE: attempted SBT on patient this AM however patient became tachypneic.  Placed patient back on full support ventilation settings.  Tolerating well at this time.  Will continue to monitor.

## 2020-09-09 NOTE — Progress Notes (Signed)
Follow up - Trauma and Critical Care  Patient Details:    Charles Weeks is an 55 y.o. male.  Anti-infectives:  Anti-infectives (From admission, onward)   Start     Dose/Rate Route Frequency Ordered Stop   08/19/20 0845  cefTRIAXone (ROCEPHIN) 2 g in sodium chloride 0.9 % 100 mL IVPB  Status:  Discontinued        2 g 200 mL/hr over 30 Minutes Intravenous Every 24 hours 08/19/20 0753 08/25/20 1304   08/17/20 1515  ceFAZolin (ANCEF) IVPB 2g/100 mL premix        2 g 200 mL/hr over 30 Minutes Intravenous  Once 08/17/20 1500 08/17/20 1530      Consults: Treatment Team:  Tia Alert, MD   Chief Complaint/Subjective:    Overnight Issues: No acute events.  Objective:  Vital signs for last 24 hours: Temp:  [98.3 F (36.8 C)-101.9 F (38.8 C)] 100 F (37.8 C) (04/24 0800) Pulse Rate:  [100-126] 102 (04/24 1100) Resp:  [14-44] 19 (04/24 1100) BP: (124-158)/(65-112) 130/72 (04/24 1100) SpO2:  [89 %-97 %] 97 % (04/24 1158) FiO2 (%):  [40 %] 40 % (04/24 1158)  Hemodynamic parameters for last 24 hours:    Intake/Output from previous day: 04/23 0701 - 04/24 0700 In: 2723.5 [I.V.:203.5; NG/GT:2520] Out: 2590 [Urine:2300; Chest Tube:290]  Intake/Output this shift: Total I/O In: 679.8 [I.V.:119.8; NG/GT:560] Out: -   Vent settings for last 24 hours: Vent Mode: PRVC FiO2 (%):  [40 %] 40 % Set Rate:  [18 bmp] 18 bmp Vt Set:  [480 mL] 480 mL PEEP:  [8 cmH20] 8 cmH20 Plateau Pressure:  [26 cmH20-31 cmH20] 27 cmH20  Physical Exam:  Gen: NAD HEENT: trach clean Resp: assisted, clear to ausculation Cardiovascular: tachycardic Abdomen: soft, NT, ND Ext: trace b/l edema Neuro: moves all extremities, sedated   Assessment/Plan:   Fall from height  R PTX- CT in place on water seal,500cc/24h yest, down to 155cc,continue today Enlarging L pleural effusion- L chest tube placed 4/19, to WS today, monitor o/p Multiple B rib fxs- these are at the junction of the thoracic  vertebral bodies, pain control, IS 3 column T8 vertebral body fx-perDr. Jones,non-op now, plan mobilize in brace once off vent. Needs CTO brace for HOB>30 degrees. I D/W Dr. Yetta Barre this AM. T7 neural arch fx- per nsgy R TVP T4-8 - pain control, per nsgy R C7 TVP fx through foramen- C collar per Dr. Yetta Barre, CTAnegatvie Large scalp laceration/right eyebrow laceration- repair by Dr. Lovick4/05/2020, sutures removed Right neck abrasion/skin tear- bacitracin BID Facial ecchymosis- CT maxillofacialdemonstrates no facial bone fractures. Some anterior subluxation of the right mandibular condyle HTN- home lopressor 25mg  daily CAD/H/O MI/DES- on ASA at home. Hold for now. 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 Poor weaning -getting tachypneic with wean trials. Trach/PEG 4/18by Dr. 03-05-1974 Rash on back- benadryl Hypotension- resolved Depression/anxiety- home bupropion whentaking PO, restarted home buspar BPH- home terazosin, repeatedly failed voiding trials, increased bethanachol 4/15. I&O q6h ABL anemia- stable FEN-TF,bowel regimen VTE-[facility] Dispo- ICU; I update his wife at bedside today   LOS: 23 days   CRITICAL CARE Performed by: 5/15   Total critical care time: 35 minutes  Critical care time was exclusive of separately billable procedures and treating other patients.  Critical care was necessary to treat or prevent imminent or life-threatening deterioration.  Critical care was time spent personally by me  on the following activities: development of treatment plan with patient and/or surrogate as well as nursing, discussions with consultants, evaluation of patient's response to treatment, examination of patient, obtaining history from patient or surrogate, ordering and performing treatments and interventions,  ordering and review of laboratory studies, ordering and review of radiographic studies, pulse oximetry and re-evaluation of patient's condition.  Marin Olp, MD [facility] Use [email or web address] to contact on call provider

## 2020-09-10 LAB — BASIC METABOLIC PANEL
Anion gap: 9 (ref 5–15)
BUN: 15 mg/dL (ref 6–20)
CO2: 33 mmol/L — ABNORMAL HIGH (ref 22–32)
Calcium: 8.4 mg/dL — ABNORMAL LOW (ref 8.9–10.3)
Chloride: 96 mmol/L — ABNORMAL LOW (ref 98–111)
Creatinine, Ser: 0.6 mg/dL — ABNORMAL LOW (ref 0.61–1.24)
GFR, Estimated: >60 mL/min (ref >60)
Glucose, Bld: 122 mg/dL — ABNORMAL HIGH (ref 70–99)
Potassium: 4 mmol/L (ref 3.5–5.1)
Sodium: 138 mmol/L (ref 135–145)

## 2020-09-10 LAB — CBC
HCT: 26.7 % — ABNORMAL LOW (ref 39.0–52.0)
Hemoglobin: 8 g/dL — ABNORMAL LOW (ref 13.0–17.0)
MCH: 29.9 pg (ref 26.0–34.0)
MCHC: 30 g/dL (ref 30.0–36.0)
MCV: 99.6 fL (ref 80.0–100.0)
Platelets: 593 10*3/uL — ABNORMAL HIGH (ref 150–400)
RBC: 2.68 MIL/uL — ABNORMAL LOW (ref 4.22–5.81)
RDW: 16.4 % — ABNORMAL HIGH (ref 11.5–15.5)
WBC: 16.6 10*3/uL — ABNORMAL HIGH (ref 4.0–10.5)
nRBC: 0 % (ref 0.0–0.2)

## 2020-09-10 LAB — GLUCOSE, CAPILLARY
Glucose-Capillary: 119 mg/dL — ABNORMAL HIGH (ref 70–99)
Glucose-Capillary: 125 mg/dL — ABNORMAL HIGH (ref 70–99)
Glucose-Capillary: 125 mg/dL — ABNORMAL HIGH (ref 70–99)
Glucose-Capillary: 137 mg/dL — ABNORMAL HIGH (ref 70–99)
Glucose-Capillary: 139 mg/dL — ABNORMAL HIGH (ref 70–99)
Glucose-Capillary: 155 mg/dL — ABNORMAL HIGH (ref 70–99)

## 2020-09-10 MED ORDER — ACETAMINOPHEN 650 MG RE SUPP: 650.0000 mg | Freq: Four times a day (QID) | RECTAL | Status: DC | PRN

## 2020-09-10 MED ORDER — ACETAMINOPHEN 160 MG/5ML PO SOLN
650.0000 mg | Freq: Four times a day (QID) | ORAL | Status: DC | PRN
  Administered 2020-09-12 – 2020-09-24 (×4): 650 mg
  Filled 2020-09-10 (×5): qty 20.3

## 2020-09-10 MED ORDER — FUROSEMIDE 10 MG/ML IJ SOLN
40.0000 mg | Freq: Once | INTRAMUSCULAR | Status: AC
  Administered 2020-09-10: 40 mg via INTRAVENOUS
  Filled 2020-09-10: qty 4

## 2020-09-10 MED ORDER — ACETAMINOPHEN 325 MG PO TABS
650.0000 mg | ORAL_TABLET | Freq: Four times a day (QID) | ORAL | Status: DC | PRN
  Administered 2020-09-26 – 2020-10-09 (×2): 650 mg
  Filled 2020-09-10 (×2): qty 2

## 2020-09-10 NOTE — Progress Notes (Signed)
Nutrition Follow-up  DOCUMENTATION CODES:   Not applicable  INTERVENTION:   Tube Feeding via PEG:  Pivot 1.5 at 60 ml/hr Provides 2160 kcals, 135 g of protein, 1092 mL of free water  200 ml free water every 4 hours Total free water: 2292 ml   NUTRITION DIAGNOSIS:   Increased nutrient needs related to  (trauma) as evidenced by estimated needs. Ongoing.   GOAL:   Patient will meet greater than or equal to 90% of their needs Met with TF.   MONITOR:   Vent status,TF tolerance  REASON FOR ASSESSMENT:   Ventilator,Consult Enteral/tube feeding initiation and management  ASSESSMENT:   Pt with PMH of COPD followed by Dr Melvyn Novas, HTN, CAD s/p stent who was admitted after fall over 1/2 wall from the second story landing with multiple rib fxs, R pneumothorax, large scalp laceration, and T8 vertebral body fxs, T7, T4-8, C7 fxs.  Pt discussed during ICU rounds and with RN.  Per MD next step is vent SNF as pt continues to not wean on the vent. Per MD pt with poor prognosis.  Pt sleepy during my visit. Spoke with pt's sister.  Pt has 2 chest tubes in place, to remain until output lower per trauma.   4/18 s/p trach and PEG  Patient is currently intubated on ventilator support MV: 12.4 L/min Temp (24hrs), Avg:100 F (37.8 C), Min:99 F (37.2 C), Max:101.4 F (38.6 C)   Medications reviewed and include: colace, folic acid, SSI, MVI with minerals, miralax, senokot, thiamine  Labs reviewed:  CBG's: 125-155  77F PEG  UOP: 1450 ml  L CT: 100 ml  R CT: 200 ml ml  I&O: + 488 L  Diet Order:   Diet Order            Diet NPO time specified  Diet effective midnight                 EDUCATION NEEDS:   No education needs have been identified at this time  Skin:  Skin Assessment: Skin Integrity Issues: Skin Integrity Issues:: DTI DTI: vertebral column Stage II: penis  Last BM:  4/24  Height:   Ht Readings from Last 1 Encounters:  08/17/20 _0  (1.727 m)     Weight:   Wt Readings from Last 1 Encounters:  09/10/20 92.7 kg    Ideal Body Weight:     BMI:  Body mass index is 31.07 kg/m.  Estimated Nutritional Needs:   Kcal:  2000-2200  Protein:  120-135 grams  Fluid:  >2 L/day  Lockie Pares., RD, LDN, CNSC See AMiON for contact information

## 2020-09-10 NOTE — Progress Notes (Signed)
Patient ID: Charles Weeks, male   DOB: Apr 21, 1966, 55 y.o.   MRN: [id number] I met with his sister at the bedside to update her and answer her questions. She has some questions about his spine and I let Dr. Ronnald Ramp and his team know. I spoke with her at length regarding Charles Weeks's poor progress with weaning. I spoke about vent SNF being the next venue for him if this continues. Medicaid does not have a LTACH benefit. She wants to have his input on this and he is more alert. I encouraged her to try to see if she can get a sense of Charles Weeks's wishes. She did say she knows his daughters are the ones to make decisions if he is unable.  Georganna Skeans, MD, MPH, FACS Please use [email or web address] to contact on call provider

## 2020-09-10 NOTE — Progress Notes (Signed)
Physical Therapy Treatment Patient Details Name: Charles Weeks MRN: [id number] DOB: 04-26-1966 Today's Date: 09/10/2020    History of Present Illness Pt is a 55 y.o. male who presented 4/1 after falling down about 11 stairs and sustained R PTX, anterior subluxation of R mandibular condyle, multiple bil ribs fxs at junction of thoracic vertebral bodies T5-8 on R and T7-8 on L, 3 column T8 vertebral body fx, T7 neural arch fx, R TVP T4-8 fxs, and R C7 TVP fx through foramen. R chest tube inserted 4/1. Pt found to be hypoxic and was intubated 4/2. No evidence of vascular injury to neck, facial fx, acute intracranial injury, lumbar spine fx, R elbow acute abnormality, or pelvic fx identified on imaging. S/p trach and PEG placement 4/18. S/p L chest tube placement 4/19. PMH: HTN, COPD, CAD, s/p MI with DES RCA in Jan 2020 just on ASA, depression/anxiety, and BPH.    PT Comments    The pt presents with improved alertness, tolerance for activity, and engagement in session. He was able to answer multiple questions appropriately for SLP using yes/no nodding and mouthing of words at times. The pt was able to demo good initiation of movement in all 4 extremities to simple commands, and to initiate movement for log roll in bed. The pt was able to tolerate sitting in TLSO brace for ~10 min before asking to return to supine position due to pain. maxA of 2 to safely maintain spinal and cervical precautions as well as to manage chest tubes safely. The pt is motivated to participate in therapies, but continues to present with deficits in strength, power, stability, and activity tolerance due to ongoing pain and deconditioning, and will therefore continue to benefit from skilled PT acutely and following d/c.     Follow Up Recommendations  CIR;Supervision/Assistance - 24 hour     Equipment Recommendations  Other (comment) (defer to post acute)    Recommendations for Other Services       Precautions /  Restrictions Precautions Precautions: Cervical;Back Precaution Booklet Issued: No Precaution Comments: Bil chest tube, trach on vent, bil wrist restraints, TLSO donned with rolling if HOB > 30 deg, cervical collar at all times, abdominal binder to protect PEG Cervical Brace: Hard collar;At all times Spinal Brace: Thoracolumbosacral orthotic;Applied in supine position Restrictions Weight Bearing Restrictions: No    Mobility  Bed Mobility Overal bed mobility: Needs Assistance Bed Mobility: Rolling;Sidelying to Sit;Sit to Sidelying Rolling: Max assist;+2 for physical assistance Sidelying to sit: Max assist;+2 for physical assistance     Sit to sidelying: Total assist;+2 for physical assistance General bed mobility comments: pt attempting to initiate with reaching of LUE and some L knee flexion maxA with bed pad to complete roll. maxA to move BLE to EOB and manage trunk elevation due to pain    Transfers                 General transfer comment: deferred due to pain      Balance Overall balance assessment: Needs assistance Sitting-balance support: Bilateral upper extremity supported;Feet supported Sitting balance-Leahy Scale: Poor Sitting balance - Comments: minG to modA to steady with BUE support Postural control: Posterior lean                                  Cognition Arousal/Alertness: Awake/alert;Lethargic Behavior During Therapy: Flat affect Overall Cognitive Status: Impaired/Different from baseline Area of Impairment: Attention;Following commands;Safety/judgement;Rancho level  Rancho Levels of Cognitive Functioning Rancho Los Amigos Scales of Cognitive Functioning: Confused/inappropriate/non-agitated   Current Attention Level: Focused;Sustained   Following Commands: Follows one step commands inconsistently;Follows one step commands with increased time       General Comments: pt awake, answering verbal questions accurately,  followed simple command to lift his LLE for donning of L sock, but unable to perform kick when seated at EOB (possibly due to pain more than cog)not able to follow complex commands      Exercises      General Comments General comments (skin integrity, edema, etc.): spinal brace unable to fasten securely with pt in sitting (donned with log roll while pt supine). VSS on vent through session      Pertinent Vitals/Pain Pain Assessment: Faces Faces Pain Scale: Hurts even more Pain Location: pointed to back Pain Descriptors / Indicators: Grimacing;Restless Pain Intervention(s): Monitored during session;Limited activity within patient's tolerance;Repositioned (sister requested pain meds for pt)           PT Goals (current goals can now be found in the care plan section) Acute Rehab PT Goals Patient Stated Goal: agreeable to session, then to lay back down due to pain PT Goal Formulation: Patient unable to participate in goal setting Time For Goal Achievement: 09/19/20 Potential to Achieve Goals: Fair Progress towards PT goals: Progressing toward goals    Frequency    Min 3X/week      PT Plan Current plan remains appropriate    Co-evaluation PT/OT/SLP Co-Evaluation/Treatment: Yes Reason for Co-Treatment: Complexity of the patient's impairments (multi-system involvement);To address functional/ADL transfers;For patient/therapist safety PT goals addressed during session: Mobility/safety with mobility;Balance;Strengthening/ROM        AM-PAC PT "6 Clicks" Mobility   Outcome Measure  Help needed turning from your back to your side while in a flat bed without using bedrails?: Total Help needed moving from lying on your back to sitting on the side of a flat bed without using bedrails?: Total Help needed moving to and from a bed to a chair (including a wheelchair)?: Total Help needed standing up from a chair using your arms (e.g., wheelchair or bedside chair)?: Total Help needed to  walk in hospital room?: Total Help needed climbing 3-5 steps with a railing? : Total 6 Click Score: 6    End of Session Equipment Utilized During Treatment: Cervical collar;Back brace;Oxygen Activity Tolerance: Patient limited by pain Patient left: in bed;with call bell/phone within reach;with bed alarm set;with restraints reapplied Nurse Communication: Mobility status PT Visit Diagnosis: Muscle weakness (generalized) (M62.81);Difficulty in walking, not elsewhere classified (R26.2)     Time: 4401-0272 PT Time Calculation (min) (ACUTE ONLY): 41 min  Charges:  $Therapeutic Activity: 8-22 mins                     Rolm Baptise, PT, DPT   Acute Rehabilitation Department Pager #: [phone]   Gaetana Michaelis 09/10/2020, 3:43 PM

## 2020-09-10 NOTE — Evaluation (Signed)
Speech Language Pathology Evaluation Patient Details Name: Charles Weeks MRN: [id number] DOB: 19-Nov-1965 Today's Date: 09/10/2020 Time: 1287-8676 SLP Time Calculation (min) (ACUTE ONLY): 36 min  Problem List:  Patient Active Problem List   Diagnosis Date Noted  . Pressure injury of skin 09/01/2020  . Thoracic spine fracture (HCC) 08/17/2020   Past Medical History:  Past Medical History:  Diagnosis Date  . BPH (benign prostatic hyperplasia) 08/17/2020  . CAD (coronary artery disease) 08/17/2020  . COPD (chronic obstructive pulmonary disease) (HCC) 08/17/2020   gold  . HTN (hypertension) 08/17/2020   Past Surgical History:  Past Surgical History:  Procedure Laterality Date  . PEG PLACEMENT N/A 09/03/2020   Procedure: PERCUTANEOUS ENDOSCOPIC GASTROSTOMY (PEG) PLACEMENT;  Surgeon: Violeta Gelinas, MD;  Location: [facility] OR;  Service: General;  Laterality: N/A;  . PERCUTANEOUS TRACHEOSTOMY N/A 09/03/2020   Procedure: PERCUTANEOUS TRACHEOSTOMY USING 6 SHILEY;  Surgeon: Violeta Gelinas, MD;  Location: [facility] OR;  Service: General;  Laterality: N/A;   HPI:  Pt is a 55 y.o. male who presented 4/1 after falling down about 11 stairs (sister reported fell over a half wall from second floor) and sustained R PTX, anterior subluxation of R mandibular condyle, multiple bil ribs fxs at junction of thoracic vertebral bodies T5-8 on R and T7-8 on L, 3 column T8 vertebral body fx, T7 neural arch fx, R TVP T4-8 fxs, and R C7 TVP fx through foramen. R chest tube 4/1. Pt found to be hypoxic and was intubated 4/2. Received trach and PEG placement 4/18. S/p L chest tube placement 4/19. PMH: HTN, COPD, CAD, s/p MI with DES RCA in Jan 2020 just on ASA, depression/anxiety, and BPH.   Assessment / Plan / Recommendation Clinical Impression  Assessment completed in conjunction with PT while sitting on the edge of bed after TLSO donned. He was awake but drowsy for most of session needing intermittent cues to attend to  therapist. His pain level interferred with ability to respond at times following simple 1 step commands with 70% consistency and no response for 2 step given extra time and repetition. He is currently on full support ventilation and plan is to attempt inline PMV assessment 4/26. He is oriented to person only via y/n head gesture responses. Rancho level V behaviors were demonstrated. As his pain level decreases suspect his ability to attend in therapy and accuracy of responses will improve.    SLP Assessment  SLP Recommendation/Assessment: Patient needs continued Speech Lanaguage Pathology Services SLP Visit Diagnosis: Cognitive communication deficit (R41.841)    Follow Up Recommendations  Skilled Nursing facility;24 hour supervision/assistance (depends on if able to wean from vent)    Frequency and Duration min 2x/week  2 weeks      SLP Evaluation Cognition  Overall Cognitive Status: Impaired/Different from baseline Arousal/Alertness: Lethargic Orientation Level: Disoriented to situation;Disoriented to time;Disoriented to place;Oriented to person Attention: Sustained Sustained Attention: Impaired Sustained Attention Impairment: Verbal basic;Functional basic Memory:  (to be further assessed) Awareness: Impaired Awareness Impairment: Intellectual impairment;Emergent impairment;Anticipatory impairment Problem Solving: Impaired (will assess for severity level) Behaviors: Restless Safety/Judgment: Impaired Rancho Mirant Scales of Cognitive Functioning: Confused/inappropriate/non-agitated       Comprehension  Auditory Comprehension Overall Auditory Comprehension: Impaired Yes/No Questions: Impaired Basic Immediate Environment Questions: 25-49% accurate Commands: Impaired One Step Basic Commands: 50-74% accurate Two Step Basic Commands: Other (comment) (no response) Interfering Components: Attention;Pain;Processing speed EffectiveTechniques: Extra processing time Visual  Recognition/Discrimination Discrimination: Not tested Reading Comprehension Reading Status: Not tested    Expression Expression  Primary Mode of Expression: Other (comment) Verbal Expression Overall Verbal Expression:  (mouthing words-vent) Pragmatics: Impairment Impairments: Abnormal affect;Eye contact Written Expression Written Expression: Not tested   Oral / Motor  Oral Motor/Sensory Function Overall Oral Motor/Sensory Function: Generalized oral weakness Motor Speech Overall Motor Speech: Other (comment) (unable to assess- trach/vent, no PMV eval yet)   GO                    [name] 09/10/2020, 3:14 PM  [name].Ed Nurse, children's [phone] Office [phone]

## 2020-09-10 NOTE — Progress Notes (Signed)
Received a phone call from daughter Charles Weeks.  She is encouraged by her father's ability to communicate with her when she visit.  He mouths I love you at her and is able to kiss her cheek.  We briefly discussed that he is up one day and down the next.  One day he's weaning and the next he is too agitated to wean and has a fever.   Charles Weeks wants to give her father more time to improve.  She commented that [place] and a vent SNF in [place] would be too far away for them to visit regularly.  I directed Charles Weeks  to discuss these concerns with the [facility] team.  Charles Richards, PA-C Palliative Medicine Office:  [phone]  No charge note.

## 2020-09-10 NOTE — Progress Notes (Signed)
Patient ID: Charles Weeks, male   DOB: 1965/09/13, 55 y.o.   MRN: [id number] Follow up - Trauma Critical Care  Patient Details:    Charles Weeks is an 55 y.o. male.  Lines/tubes : CVC Triple Lumen 08/18/20 Right Subclavian (Active)  Indication for Insertion or Continuance of Line Poor Vasculature-patient has had multiple peripheral attempts or PIVs lasting less than 24 hours 09/10/20 0800  Site Assessment Clean;Dry;Intact 09/10/20 0800  Proximal Lumen Status Infusing 09/10/20 0800  Medial Lumen Status Saline locked 09/10/20 0800  Distal Lumen Status In-line blood sampling system in place 09/10/20 0800  Dressing Type Transparent;Occlusive 09/10/20 0800  Dressing Status Clean;Dry;Intact 09/10/20 0800  Antimicrobial disc in place? Yes 09/10/20 0800  Line Care Distal tubing changed;Distal cap changed 09/09/20 2300  Dressing Intervention Dressing changed;Antimicrobial disc changed 09/09/20 2300  Dressing Change Due 09/16/20 09/10/20 0800     Chest Tube Lateral;Right (Active)  Status To water seal 09/10/20 0800  Chest Tube Air Leak None 09/09/20 2000  Patency Intervention Tip/tilt 09/10/20 0800  Drainage Description Serous 09/10/20 0800  Dressing Status Dry;Clean;Intact 09/10/20 0800  Dressing Intervention Dressing changed 09/08/20 0000  Site Assessment Clean;Dry;Intact 09/10/20 0800  Surrounding Skin Unable to view 09/10/20 0800  Output (mL) 100 mL 09/10/20 0500     Chest Tube Lateral;Left 20 Fr. (Active)  Status To water seal 09/10/20 0800  Chest Tube Air Leak None 09/08/20 2000  Patency Intervention Tip/tilt 09/10/20 0800  Drainage Description Serosanguineous 09/10/20 0800  Dressing Status Clean;Dry;Intact 09/10/20 0800  Dressing Intervention Dressing changed 09/08/20 0000  Site Assessment Clean;Dry;Intact 09/10/20 0800  Surrounding Skin Unable to view 09/10/20 0800  Output (mL) 60 mL 09/10/20 0500     Gastrostomy/Enterostomy Percutaneous endoscopic gastrostomy (PEG) 20 Fr.  LUQ (Active)  Surrounding Skin Dry;Intact 09/10/20 0800  Tube Status Patent 09/10/20 0800  Drainage Appearance None 09/09/20 0800  Dressing Status Clean;Dry;Intact 09/10/20 0800  Dressing Intervention Other (Comment) 09/06/20 2000  Dressing Type Abdominal Binder;Split gauze 09/10/20 0800     External Urinary Catheter (Active)  Collection Container Dedicated Suction Canister 09/10/20 0800  Suction (Verified suction is between 40-80 mmHg) Yes 09/09/20 2000  Securement Method Tape 09/07/20 0800  Site Assessment Clean;Intact 09/09/20 2000  Intervention Equipment Changed 09/08/20 1200  Output (mL) 50 mL 09/09/20 1800    Microbiology/Sepsis markers: Results for orders placed or performed during the hospital encounter of 08/17/20  Resp Panel by RT-PCR (Flu A&B, Covid) Nasopharyngeal Swab     Status: None   Collection Time: 08/17/20  3:40 PM   Specimen: Nasopharyngeal Swab; Nasopharyngeal(NP) swabs in vial transport medium  Result Value Ref Range Status   SARS Coronavirus 2 by RT PCR NEGATIVE NEGATIVE Final    Comment: (NOTE) SARS-CoV-2 target nucleic acids are NOT DETECTED.  The SARS-CoV-2 RNA is generally detectable in upper respiratory specimens during the acute phase of infection. The lowest concentration of SARS-CoV-2 viral copies this assay can detect is 138 copies/mL. A negative result does not preclude SARS-Cov-2 infection and should not be used as the sole basis for treatment or other patient management decisions. A negative result may occur with  improper specimen collection/handling, submission of specimen other than nasopharyngeal swab, presence of viral mutation(s) within the areas targeted by this assay, and inadequate number of viral copies(<138 copies/mL). A negative result must be combined with clinical observations, patient history, and epidemiological information. The expected result is Negative.  Fact Sheet for Patients:   BloggerCourse.com  Fact Sheet for Healthcare Providers:  SeriousBroker.it  This  test is no t yet approved or cleared by the [place] and  has been authorized for detection and/or diagnosis of SARS-CoV-2 by FDA under an Emergency Use Authorization (EUA). This EUA will remain  in effect (meaning this test can be used) for the duration of the COVID-19 declaration under Section 564(b)(1) of the Act, 21 U.S.C.section 360bbb-3(b)(1), unless the authorization is terminated  or revoked sooner.       Influenza A by PCR NEGATIVE NEGATIVE Final   Influenza B by PCR NEGATIVE NEGATIVE Final    Comment: (NOTE) The Xpert Xpress SARS-CoV-2/FLU/RSV plus assay is intended as an aid in the diagnosis of influenza from Nasopharyngeal swab specimens and should not be used as a sole basis for treatment. Nasal washings and aspirates are unacceptable for Xpert Xpress SARS-CoV-2/FLU/RSV testing.  Fact Sheet for Patients: BloggerCourse.com  Fact Sheet for Healthcare Providers: SeriousBroker.it  This test is not yet approved or cleared by the [place] FDA and has been authorized for detection and/or diagnosis of SARS-CoV-2 by FDA under an Emergency Use Authorization (EUA). This EUA will remain in effect (meaning this test can be used) for the duration of the COVID-19 declaration under Section 564(b)(1) of the Act, 21 U.S.C. section 360bbb-3(b)(1), unless the authorization is terminated or revoked.  Performed at [facility], [address], [place], [place] [address]   Surgical PCR screen     Status: None   Collection Time: 08/19/20  7:38 AM   Specimen: Nasal Mucosa; Nasal Swab  Result Value Ref Range Status   MRSA, PCR NEGATIVE NEGATIVE Final   Staphylococcus aureus NEGATIVE NEGATIVE Final    Comment: (NOTE) The Xpert SA Assay (FDA approved for NASAL specimens in patients  20 years of age and older), is one component of a comprehensive surveillance program. It is not intended to diagnose infection nor to guide or monitor treatment. Performed at [facility], [address], [place], [place] [address]     Anti-infectives:  Anti-infectives (From admission, onward)   Start     Dose/Rate Route Frequency Ordered Stop   08/19/20 0845  cefTRIAXone (ROCEPHIN) 2 g in sodium chloride 0.9 % 100 mL IVPB  Status:  Discontinued        2 g 200 mL/hr over 30 Minutes Intravenous Every 24 hours 08/19/20 0753 08/25/20 1304   08/17/20 1515  ceFAZolin (ANCEF) IVPB 2g/100 mL premix        2 g 200 mL/hr over 30 Minutes Intravenous  Once 08/17/20 1500 08/17/20 1530      Best Practice/Protocols:  VTE Prophylaxis: Lovenox (prophylaxtic dose) Continous Sedation  Consults: Treatment Team:  Tia Alert, MD    Studies:    Events:  Subjective:    Overnight Issues:   Objective:  Vital signs for last 24 hours: Temp:  [99 F (37.2 C)-101.4 F (38.6 C)] 99.1 F (37.3 C) (04/25 0800) Pulse Rate:  [100-130] 114 (04/25 0800) Resp:  [17-36] 19 (04/25 0800) BP: (112-164)/(64-95) 138/73 (04/25 0815) SpO2:  [91 %-97 %] 94 % (04/25 0815) FiO2 (%):  [40 %] 40 % (04/25 0815) Weight:  [92.7 kg] 92.7 kg (04/25 0500)  Hemodynamic parameters for last 24 hours:    Intake/Output from previous day: 04/24 0701 - 04/25 0700 In: 2977.3 [I.V.:337.3; NG/GT:2640] Out: 1750 [Urine:1450; Chest Tube:300]  Intake/Output this shift: Total I/O In: 10 [I.V.:10] Out: 950 [Urine:950]  Vent settings for last 24 hours: Vent Mode: PRVC FiO2 (%):  [40 %] 40 % Set  Rate:  [18 bmp] 18 bmp Vt Set:  [480 mL] 480 mL PEEP:  [8 cmH20] 8 cmH20 Plateau Pressure:  [16 cmH20-29 cmH20] 19 cmH20  Physical Exam:  General: alert and no respiratory distress Neuro: alert and F/C on vent HEENT/Neck: trach-clean, intact Resp: clear to auscultation bilaterally CVS: RRR GI: soft, NT, PEG  site OK Extremities: edema 1+  Results for orders placed or performed during the hospital encounter of 08/17/20 (from the past 24 hour(s))  Glucose, capillary     Status: Abnormal   Collection Time: 09/09/20 12:15 PM  Result Value Ref Range   Glucose-Capillary 159 (H) 70 - 99 mg/dL   Comment 1 Notify RN    Comment 2 Document in Chart   Glucose, capillary     Status: Abnormal   Collection Time: 09/09/20  3:48 PM  Result Value Ref Range   Glucose-Capillary 152 (H) 70 - 99 mg/dL   Comment 1 Notify RN    Comment 2 Document in Chart   Glucose, capillary     Status: Abnormal   Collection Time: 09/09/20  7:52 PM  Result Value Ref Range   Glucose-Capillary 145 (H) 70 - 99 mg/dL  Glucose, capillary     Status: Abnormal   Collection Time: 09/09/20 11:43 PM  Result Value Ref Range   Glucose-Capillary 140 (H) 70 - 99 mg/dL  Glucose, capillary     Status: Abnormal   Collection Time: 09/10/20  3:48 AM  Result Value Ref Range   Glucose-Capillary 125 (H) 70 - 99 mg/dL  CBC     Status: Abnormal   Collection Time: 09/10/20  4:52 AM  Result Value Ref Range   WBC 16.6 (H) 4.0 - 10.5 K/uL   RBC 2.68 (L) 4.22 - 5.81 MIL/uL   Hemoglobin 8.0 (L) 13.0 - 17.0 g/dL   HCT 14.3 (L) 88.8 - 75.7 %   MCV 99.6 80.0 - 100.0 fL   MCH 29.9 26.0 - 34.0 pg   MCHC 30.0 30.0 - 36.0 g/dL   RDW 97.2 (H) 82.0 - 60.1 %   Platelets 593 (H) 150 - 400 K/uL   nRBC 0.0 0.0 - 0.2 %  Basic metabolic panel     Status: Abnormal   Collection Time: 09/10/20  4:52 AM  Result Value Ref Range   Sodium 138 135 - 145 mmol/L   Potassium 4.0 3.5 - 5.1 mmol/L   Chloride 96 (L) 98 - 111 mmol/L   CO2 33 (H) 22 - 32 mmol/L   Glucose, Bld 122 (H) 70 - 99 mg/dL   BUN 15 6 - 20 mg/dL   Creatinine, Ser 5.61 (L) 0.61 - 1.24 mg/dL   Calcium 8.4 (L) 8.9 - 10.3 mg/dL   GFR, Estimated >53 >79 mL/min   Anion gap 9 5 - 15  Glucose, capillary     Status: Abnormal   Collection Time: 09/10/20  7:44 AM  Result Value Ref Range    Glucose-Capillary 125 (H) 70 - 99 mg/dL    Assessment & Plan: Present on Admission: . Thoracic spine fracture (HCC)    LOS: 24 days   Additional comments:I reviewed the patient's new clinical lab test results. . Fall from height  R PTX, B effusion- B CT on water seal, R 200cc/24h, L 100cc/24h. Leave until output down Multiple B rib fxs- these are at the junction of the thoracic vertebral bodies, pain control, IS 3 column T8 vertebral body fx-perDr. Jones,non-op now, plan mobilize in brace once off vent. Needs CTO  brace for HOB>30 degrees. I D/W Dr. Yetta Barre this AM. T7 neural arch fx- per nsgy R TVP T4-8 - pain control, per nsgy R C7 TVP fx through foramen- C collar per Dr. Yetta Barre, CTAnegatvie Large scalp laceration/right eyebrow laceration- repair by Dr. Lovick4/05/2020, sutures removed Right neck abrasion/skin tear- bacitracin BID Facial ecchymosis- CT maxillofacialdemonstrates no facial bone fractures. Some anterior subluxation of the right mandibular condyle HTN- home lopressor 25mg  daily CAD/H/O MI/DES- on ASA at home. Hold for now. 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 Poor weaning. Trach/PEG 4/18by Dr. 03-05-1974 Rash on back- benadryl Hypotension- resolved Depression/anxiety- home bupropion whentaking PO, home buspar BPH- home terazosin, repeatedly failed voiding trials, increased bethanachol 4/15. I&O q6h ABL anemia- stable FEN-TF,bowel regimen, lasix X 1 now [facility] Dispo- ICU; Medicaid does not have a LTACH benefit so plan vent SNF. Palliative Care also following. Critical Care Total Time*: 34 Minutes  [facility], MD, MPH, FACS Trauma & General Surgery Use [email or web address] to contact on call provider  09/10/2020  *Care during the described time interval was provided by me. I have reviewed this  patient's available data, including medical history, events of note, physical examination and test results as part of my evaluation.

## 2020-09-10 NOTE — TOC Progression Note (Signed)
Transition of Care [facility]) - Progression Note    Patient Details  Name: Charles Weeks MRN: [id number] Date of Birth: [birth year]  Transition of [facility]) CM/SW Contact  Ella Bodo, RN Phone Number: 09/10/2020, [phone]  Clinical Narrative:   Met with patient's sister, Charles Weeks, at bedside today to discuss discharge planning.  We discussed that patient is reaching medical stability and will likely need ventilator SNF, as he is unable to wean from the vent.  She states that she feels that MD has "given up on [name]", and that she feels he needs more time in hospital to wean from the ventilator.  Sister states she was given 6 weeks to wean from ventilator when she had her tracheostomy.  I did explain to her that all patients are different, and that with his history and medical issues, he has a poor prognosis for weaning from the ventilator.  We discussed potential options for discharge, including ventilator SNFs in [place]. [place].  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  We will need to discuss potential options with patient's daughters, as they are ultimately the decision makers for patient.  Patient with bilateral chest tubes; uncertain as to timing for transfer to vent SNF.  Will initiate call to daughters to discuss plan, and reach out to [facility] vent SNFs requesting admission.      Expected Discharge Plan: [place] Barriers to Discharge: Continued Medical Work up  Expected Discharge Plan and Services Expected Discharge Plan: [place]   Discharge Planning Services: CM Consult   Living arrangements for the past 2 months: Single Family Home                                       Social Determinants of Health (SDOH) Interventions    Readmission Risk Interventions No flowsheet data found.  Reinaldo Raddle, RN, BSN  Trauma/Neuro ICU Case  Manager [phone]

## 2020-09-11 ENCOUNTER — Inpatient Hospital Stay (HOSPITAL_COMMUNITY): Payer: Medicaid Other

## 2020-09-11 LAB — GLUCOSE, CAPILLARY
Glucose-Capillary: 96 mg/dL (ref 70–99)
Glucose-Capillary: 139 mg/dL — ABNORMAL HIGH (ref 70–99)
Glucose-Capillary: 145 mg/dL — ABNORMAL HIGH (ref 70–99)
Glucose-Capillary: 117 mg/dL — ABNORMAL HIGH (ref 70–99)
Glucose-Capillary: 133 mg/dL — ABNORMAL HIGH (ref 70–99)
Glucose-Capillary: 132 mg/dL — ABNORMAL HIGH (ref 70–99)

## 2020-09-11 LAB — BASIC METABOLIC PANEL
Anion gap: 8 (ref 5–15)
BUN: 14 mg/dL (ref 6–20)
CO2: 33 mmol/L — ABNORMAL HIGH (ref 22–32)
Calcium: 8.5 mg/dL — ABNORMAL LOW (ref 8.9–10.3)
Chloride: 98 mmol/L (ref 98–111)
Creatinine, Ser: 0.62 mg/dL (ref 0.61–1.24)
GFR, Estimated: >60 mL/min (ref >60)
Glucose, Bld: 120 mg/dL — ABNORMAL HIGH (ref 70–99)
Potassium: 3.7 mmol/L (ref 3.5–5.1)
Sodium: 139 mmol/L (ref 135–145)

## 2020-09-11 LAB — CBC
HCT: 26.6 % — ABNORMAL LOW (ref 39.0–52.0)
Hemoglobin: 7.8 g/dL — ABNORMAL LOW (ref 13.0–17.0)
MCH: 29.4 pg (ref 26.0–34.0)
MCHC: 29.3 g/dL — ABNORMAL LOW (ref 30.0–36.0)
MCV: 100.4 fL — ABNORMAL HIGH (ref 80.0–100.0)
Platelets: 596 10*3/uL — ABNORMAL HIGH (ref 150–400)
RBC: 2.65 MIL/uL — ABNORMAL LOW (ref 4.22–5.81)
RDW: 16.4 % — ABNORMAL HIGH (ref 11.5–15.5)
WBC: 15 10*3/uL — ABNORMAL HIGH (ref 4.0–10.5)
nRBC: 0 % (ref 0.0–0.2)

## 2020-09-11 MED ORDER — FUROSEMIDE 10 MG/ML IJ SOLN
40.0000 mg | Freq: Once | INTRAMUSCULAR | Status: AC
  Administered 2020-09-11: 40 mg via INTRAVENOUS
  Filled 2020-09-11: qty 4

## 2020-09-11 NOTE — Progress Notes (Signed)
Physical Therapy Treatment Patient Details Name: Charles Weeks MRN: [id number] DOB: [birth year] Today's Date: 09/11/2020    History of Present Illness Pt is a 55 y.o. male who presented 4/1 after falling down about 11 stairs and sustained R PTX, anterior subluxation of R mandibular condyle, multiple bil ribs fxs at junction of thoracic vertebral bodies T5-8 on R and T7-8 on L, 3 column T8 vertebral body fx, T7 neural arch fx, R TVP T4-8 fxs, and R C7 TVP fx through foramen. R chest tube inserted 4/1. Pt found to be hypoxic and was intubated 4/2. No evidence of vascular injury to neck, facial fx, acute intracranial injury, lumbar spine fx, R elbow acute abnormality, or pelvic fx identified on imaging. S/p trach and PEG placement 4/18. S/p L chest tube placement 4/19. PMH: HTN, COPD, CAD, s/p MI with DES RCA in Jan 2020 just on ASA, depression/anxiety, and BPH.    PT Comments    Pt alert, but demonstrates restlessness, agitation, and intermittent combative behaviors. Benefits from increased time and instruction prior to mobility. Pt inconsistently following 1 step commands or visually tracking therapist. Requiring two person mod-total assist for functional mobiltiy. Able to stand from edge of bed x 3 today. VSS on 40% FiO2, 8 PEEP. Will continue to progress as tolerated.    Follow Up Recommendations  Supervision/Assistance - 24 hour;SNF     Equipment Recommendations  Wheelchair (measurements PT);Wheelchair cushion (measurements PT);Hospital bed;Other (comment) (hoyer lift)    Recommendations for Other Services       Precautions / Restrictions Precautions Precautions: Cervical;Back Precaution Booklet Issued: No Precaution Comments: Bil chest tube, trach on vent, TLSO donned with rolling if HOB > 30 deg, cervical collar at all times, abdominal binder to protect PEG Cervical Brace: Hard collar;At all times Spinal Brace: Thoracolumbosacral orthotic;Applied in supine  position Restrictions Weight Bearing Restrictions: No    Mobility  Bed Mobility Overal bed mobility: Needs Assistance Bed Mobility: Rolling;Sidelying to Sit;Sit to Sidelying Rolling: Max assist;+2 for physical assistance Sidelying to sit: Max assist;+2 for physical assistance     Sit to sidelying: Total assist;+2 for physical assistance General bed mobility comments: Requiring max-totalA + 2-3 for all aspects of mobility. able to initiate intermittently with reaching contralateral arm, but does not follow cues to hold onto bed rail    Transfers Overall transfer level: Needs assistance Equipment used: None Transfers: Sit to/from Stand Sit to Stand: Max assist;+2 safety/equipment         General transfer comment: MaxA + 2 to stand x 3 from edge of bed. Pt initiating well on first attempt and then resistive on subsequent attempts  Ambulation/Gait             General Gait Details: unable   Stairs             Wheelchair Mobility    Modified Rankin (Stroke Patients Only)       Balance Overall balance assessment: Needs assistance Sitting-balance support: Bilateral upper extremity supported;Feet supported Sitting balance-Leahy Scale: Poor Sitting balance - Comments: Initial heavy posterior lean with bilateral knees blocked, progressing to min guard assist Postural control: Posterior lean Standing balance support: Bilateral upper extremity supported Standing balance-Leahy Scale: Poor Standing balance comment: reliant on bilateral HHA, fatigues quickly                            Cognition Arousal/Alertness: Awake/alert;Lethargic Behavior During Therapy: Flat affect Overall Cognitive Status: Impaired/Different from baseline Area of  Impairment: Attention;Following commands;Safety/judgement;Rancho level;Awareness               Rancho Levels of Cognitive Functioning Rancho Los Amigos Scales of Cognitive Functioning: Confused/agitated    Current Attention Level: Focused   Following Commands: Follows one step commands inconsistently;Follows one step commands with increased time Safety/Judgement: Decreased awareness of safety;Decreased awareness of deficits Awareness: Intellectual   General Comments: Pt following 1 step commands inconsistently with varying levels of initiation and repetition required. Pt intermittently combative and "swatting;" benefits from instruction and increased time prior to mobility i.e. "Charles Weeks we are going to roll onto your left side." Pt verbalizing occasionally, but difficult to understand.      Exercises General Exercises - Lower Extremity Long Arc Quad: AAROM;Both;5 reps;Seated    General Comments        Pertinent Vitals/Pain Pain Assessment: Faces Faces Pain Scale: Hurts even more Pain Location: pointed to left abdomen with rolling to right Pain Descriptors / Indicators: Grimacing;Restless Pain Intervention(s): Limited activity within patient's tolerance;Monitored during session;Repositioned    Home Living                      Prior Function            PT Goals (current goals can now be found in the care plan section) Acute Rehab PT Goals Patient Stated Goal: agreeable to session, then to lay back down due to pain PT Goal Formulation: Patient unable to participate in goal setting Time For Goal Achievement: 09/19/20 Potential to Achieve Goals: Fair Progress towards PT goals: Progressing toward goals    Frequency    Min 3X/week      PT Plan Discharge plan needs to be updated    Co-evaluation PT/OT/SLP Co-Evaluation/Treatment: Yes Reason for Co-Treatment: Complexity of the patient's impairments (multi-system involvement);Necessary to address cognition/behavior during functional activity;For patient/therapist safety;To address functional/ADL transfers PT goals addressed during session: Mobility/safety with mobility;Balance;Strengthening/ROM        AM-PAC PT  "6 Clicks" Mobility   Outcome Measure  Help needed turning from your back to your side while in a flat bed without using bedrails?: Total Help needed moving from lying on your back to sitting on the side of a flat bed without using bedrails?: Total Help needed moving to and from a bed to a chair (including a wheelchair)?: Total Help needed standing up from a chair using your arms (e.g., wheelchair or bedside chair)?: Total Help needed to walk in hospital room?: Total Help needed climbing 3-5 steps with a railing? : Total 6 Click Score: 6    End of Session Equipment Utilized During Treatment: Cervical collar;Back brace;Oxygen Activity Tolerance: Patient limited by pain Patient left: in bed;with call bell/phone within reach;with bed alarm set;with nursing/sitter in room Nurse Communication: Mobility status PT Visit Diagnosis: Muscle weakness (generalized) (M62.81);Difficulty in walking, not elsewhere classified (R26.2)     Time: 5784-6962 PT Time Calculation (min) (ACUTE ONLY): 37 min  Charges:  $Therapeutic Activity: 8-22 mins                     Lillia Pauls, PT, DPT Acute Rehabilitation Services Pager [phone] Office [phone]    Norval Morton 09/11/2020, 4:24 PM

## 2020-09-11 NOTE — Evaluation (Signed)
Passy-Muir Speaking Valve - Evaluation Patient Details  Name: Charles Weeks MRN: [id number] Date of Birth: 02/19/1966  Today's Date: 09/11/2020 Time: 1029-1050 SLP Time Calculation (min) (ACUTE ONLY): 21 min  Past Medical History:  Past Medical History:  Diagnosis Date  . BPH (benign prostatic hyperplasia) 08/17/2020  . CAD (coronary artery disease) 08/17/2020  . COPD (chronic obstructive pulmonary disease) (HCC) 08/17/2020   gold  . HTN (hypertension) 08/17/2020   Past Surgical History:  Past Surgical History:  Procedure Laterality Date  . PEG PLACEMENT N/A 09/03/2020   Procedure: PERCUTANEOUS ENDOSCOPIC GASTROSTOMY (PEG) PLACEMENT;  Surgeon: Violeta Gelinas, MD;  Location: [facility] OR;  Service: General;  Laterality: N/A;  . PERCUTANEOUS TRACHEOSTOMY N/A 09/03/2020   Procedure: PERCUTANEOUS TRACHEOSTOMY USING 6 SHILEY;  Surgeon: Violeta Gelinas, MD;  Location: [facility] OR;  Service: General;  Laterality: N/A;   HPI:  Pt is a 55 y.o. male who presented 4/1 after falling down about 11 stairs (sister reported fell over a half wall from second floor) and sustained R PTX, anterior subluxation of R mandibular condyle, multiple bil ribs fxs at junction of thoracic vertebral bodies T5-8 on R and T7-8 on L, 3 column T8 vertebral body fx, T7 neural arch fx, R TVP T4-8 fxs, and R C7 TVP fx through foramen. R chest tube 4/1. Pt found to be hypoxic and was intubated 4/2. Received trach and PEG placement 4/18. S/p L chest tube placement 4/19. PMH: HTN, COPD, CAD, s/p MI with DES RCA in Jan 2020 just on ASA, depression/anxiety, and BPH.   Assessment / Plan / Recommendation Clinical Impression  Inline PMV assessment with RT and pt's sister and daughter present. Pt in PS and switched to [facility] with RT, cuff deflated, deep suctioned and PEEP turned to 0. Incompletely expectorated secretions, continuing throughout eval with respiratory compromise preventing clearance despite use of valve to provide increased pressure.  Work of breathing increased although RR remained 20-24, HR 110, SpO2 99%. RT switched to PS for pt to control respiratory coordination without success. With increased internal distractions his attempts to respond were decreased today. He mouthed and attempted to phonate without audible vocalization. Inline PMV with SLP and RT at present with working toward increased cough, secretion management and vocalization. SLP Visit Diagnosis: Aphonia (R49.1)    SLP Assessment  Patient needs continued Speech Lanaguage Pathology Services    Follow Up Recommendations  Skilled Nursing facility;24 hour supervision/assistance    Frequency and Duration min 2x/week  2 weeks    PMSV Trial PMSV was placed for: 12 min Able to redirect subglottic air through upper airway: Yes Able to Attain Phonation: No Able to Expectorate Secretions: Yes Level of Secretion Expectoration with PMSV: Oral Breath Support for Phonation: Moderately decreased Intelligibility: Unable to assess (comment) Respirations During Trial:  (20-24) SpO2 During Trial: 99 % Pulse During Trial: 110 Behavior: Cooperative;Poor eye contact   Tracheostomy Tube       Vent Dependency  Vent Mode: CPAP;PSV PEEP: 8 cmH20 Pressure Support: 8 cmH20 FiO2 (%): 40 %    Cuff Deflation Trial  GO Tolerated Cuff Deflation:  (moderately - see impression) Behavior: Poor eye contact (difficulty controling respiration and attempts to clear secretions)        [name] 09/11/2020, 12:06 PM  [name].Ed Nurse, children's [phone] Office [phone]

## 2020-09-11 NOTE — Progress Notes (Signed)
Occupational Therapy Treatment Patient Details Name: Charles Weeks MRN: [id number] DOB: Apr 17, 1966 Today's Date: 09/11/2020    History of present illness 55 y.o. male who presented 4/1 after falling down about 11 stairs and sustained R PTX, anterior subluxation of R mandibular condyle, multiple bil ribs fxs at junction of thoracic vertebral bodies T5-8 on R and T7-8 on L, 3 column T8 vertebral body fx, T7 neural arch fx, R TVP T4-8 fxs, and R C7 TVP fx through foramen. R chest tube inserted 4/1. Pt found to be hypoxic and was intubated 4/2. No evidence of vascular injury to neck, facial fx, acute intracranial injury, lumbar spine fx, R elbow acute abnormality, or pelvic fx identified on imaging. S/p trach and PEG placement 4/18. S/p L chest tube placement 4/19. PMH: HTN, COPD, CAD, s/p MI with DES RCA in Jan 2020 just on ASA, depression/anxiety, and BPH.   OT comments  Upon arrival, pt resting and supine in bed; periodically reaching towards trach or face. Pt continues to present with decreased balance, cognition, and activity tolerance. Pt requiring Max A +2 for rolling in bed and Total A for donning back brace. Pt requiring Max-Total A +2 for supine<>sit at EOB. Pt presenting at [facility] with increased agitation, difficulty consistently following commands, and "swatting"/hitting at therapists. Continue to recommend dc to SNF and will continue to follow acutely as admitted.    Follow Up Recommendations  LTACH;SNF    Equipment Recommendations  None recommended by OT    Recommendations for Other Services      Precautions / Restrictions Precautions Precautions: Cervical;Back Precaution Booklet Issued: No Precaution Comments: Bil chest tube, trach on vent, TLSO donned with rolling if HOB > 30 deg, cervical collar at all times, abdominal binder to protect PEG Cervical Brace: Hard collar;At all times Spinal Brace: Thoracolumbosacral orthotic;Applied in supine position Restrictions Weight  Bearing Restrictions: No       Mobility Bed Mobility Overal bed mobility: Needs Assistance Bed Mobility: Rolling;Sidelying to Sit;Sit to Sidelying Rolling: Max assist;+2 for physical assistance Sidelying to sit: Max assist;+2 for physical assistance     Sit to sidelying: Total assist;+2 for physical assistance General bed mobility comments: Requiring max-totalA + 2-3 for all aspects of mobility. able to initiate intermittently with reaching contralateral arm, but does not follow cues to hold onto bed rail    Transfers Overall transfer level: Needs assistance Equipment used: None Transfers: Sit to/from Stand Sit to Stand: Max assist;+2 safety/equipment         General transfer comment: MaxA + 2 to stand x 3 from edge of bed. Pt initiating well on first attempt and then resistive on subsequent attempts    Balance Overall balance assessment: Needs assistance Sitting-balance support: Bilateral upper extremity supported;Feet supported Sitting balance-Leahy Scale: Poor Sitting balance - Comments: Initial heavy posterior lean with bilateral knees blocked, progressing to min guard assist Postural control: Posterior lean Standing balance support: Bilateral upper extremity supported Standing balance-Leahy Scale: Poor Standing balance comment: reliant on bilateral HHA, fatigues quickly                           ADL either performed or assessed with clinical judgement   ADL Overall ADL's : Needs assistance/impaired                                       General ADL Comments:  Total A for ADLs. Not engaging to wipe mouth     Vision       Perception     Praxis      Cognition Arousal/Alertness: Awake/alert;Lethargic Behavior During Therapy: Flat affect Overall Cognitive Status: Impaired/Different from baseline Area of Impairment: Attention;Following commands;Safety/judgement;Rancho level;Awareness               Rancho Levels of Cognitive  Functioning Rancho Los Amigos Scales of Cognitive Functioning: Confused/agitated   Current Attention Level: Focused   Following Commands: Follows one step commands inconsistently;Follows one step commands with increased time Safety/Judgement: Decreased awareness of safety;Decreased awareness of deficits Awareness: Intellectual   General Comments: Pt following 1 step commands inconsistently with varying levels of initiation and repetition required. Pt intermittently combative and "swatting;" benefits from instruction and increased time prior to mobility i.e. "Mico we are going to roll onto your left side." Pt verbalizing occasionally, but difficult to understand.        Exercises Exercises: General Lower Extremity General Exercises - Lower Extremity Long Arc Quad: AAROM;Both;5 reps;Seated   Shoulder Instructions       General Comments VSS on vent    Pertinent Vitals/ Pain       Pain Assessment: Faces Faces Pain Scale: Hurts even more Pain Location: pointed to left abdomen with rolling to right Pain Descriptors / Indicators: Grimacing;Restless Pain Intervention(s): Monitored during session;Limited activity within patient's tolerance;Repositioned  Home Living                                          Prior Functioning/Environment              Frequency  Min 2X/week        Progress Toward Goals  OT Goals(current goals can now be found in the care plan section)  Progress towards OT goals: Progressing toward goals  Acute Rehab OT Goals Patient Stated Goal: agreeable to session, then to lay back down due to pain OT Goal Formulation: Patient unable to participate in goal setting Time For Goal Achievement: 09/20/20 Potential to Achieve Goals: Good ADL Goals Pt Will Perform Grooming: sitting;bed level;with mod assist Additional ADL Goal #1: Pt will consistently follow 1 step commands ` Additional ADL Goal #2: Pt will tolerate upright position - EOB  sitting, recliner, or chair egress x 10 mins with VSS in prep for ADLs  Plan Discharge plan remains appropriate    Co-evaluation    PT/OT/SLP Co-Evaluation/Treatment: Yes Reason for Co-Treatment: For patient/therapist safety;To address functional/ADL transfers PT goals addressed during session: Mobility/safety with mobility;Balance;Strengthening/ROM OT goals addressed during session: ADL's and self-care      AM-PAC OT "6 Clicks" Daily Activity     Outcome Measure   Help from another person eating meals?: Total Help from another person taking care of personal grooming?: Total Help from another person toileting, which includes using toliet, bedpan, or urinal?: Total Help from another person bathing (including washing, rinsing, drying)?: Total Help from another person to put on and taking off regular upper body clothing?: Total Help from another person to put on and taking off regular lower body clothing?: Total 6 Click Score: 6    End of Session Equipment Utilized During Treatment: Oxygen;Back brace;Cervical collar  OT Visit Diagnosis: Muscle weakness (generalized) (M62.81)   Activity Tolerance Patient tolerated treatment well   Patient Left in bed;with call bell/phone within reach   Nurse Communication  Mobility status        Time: 5809-9833 OT Time Calculation (min): 35 min  Charges: OT General Charges $OT Visit: 1 Visit OT Treatments $Therapeutic Activity: 8-22 mins  Radwan Cowley MSOT, OTR/L Acute Rehab Pager: [phone] Office: [phone]   Theodoro Grist Londell Noll 09/11/2020, 4:50 PM

## 2020-09-11 NOTE — TOC Progression Note (Addendum)
Transition of Care (TOC) - Progression Note    Patient Details  Name: Charles Weeks MRN: [id number] Date of Birth: 09/13/1965  Transition of Care (TOC) CM/SW Contact  ,  M, RN Phone Number: 09/11/2020, 4:15 PM  Clinical Narrative:   Met with patient's sister [name] and daughter [name] in room to discuss disposition.  [name] states she has looked up reviews for [facility] in [place], and there is no way that he will go there.  She states she wants to take him home with her as the main caregiver, and assistance from patient's daughters.  We discussed the process for taking a patient home on the ventilator, including applying for assistance or Medicaid for home health nursing services, home assessment, and extensive teaching.  Upon leaving the room, I was approached by [name], who stated that she felt pressure from [name] to take patient home, and wanted to make sure that he is not discharged too soon.  [name] exited the room, and was upset with [name]  that we were making plans without her.  She loudly berated [name] and told her that she was just going to go home, and that [name] and her sister would just have to figure it out.  [name] was visibly upset and crying, stating that she feels like [name] is putting a lot of pressure on her and her sister to take him home, and she does not know what to do.  She met with Elaine, the bedside nurse, and Case Manager, and we discussed at length patient's discharge options.  She states that she feels like patient will probably need to be placed in a facility, but wants to make sure that it is a good one.  Explained to daughter that once patient is medically stable and not requiring hospital care, we will need to look at discharge; she understands this.  Recommended that she and her sister truly look at whether or not they will be able to work together with [name] to care appropriately for their father.  Reminded daughter that she and her sister are ultimately  the decision makers for their dad.  [name] contracted to talk with her sister about these discussions.  Offered to set up meeting with [name], [name], case manager and physician, if they desire.  Will continue to follow and provide updates as available.    Expected Discharge Plan: Skilled Nursing Facility Barriers to Discharge: Continued Medical Work up  Expected Discharge Plan and Services Expected Discharge Plan: Skilled Nursing Facility   Discharge Planning Services: CM Consult   Living arrangements for the past 2 months: Single Family Home                                       Social Determinants of Health (SDOH) Interventions    Readmission Risk Interventions No flowsheet data found.   W. , RN, BSN  Trauma/Neuro ICU Case Manager [phone] 

## 2020-09-11 NOTE — Progress Notes (Signed)
Trauma/Critical Care Follow Up Note  Subjective:    Overnight Issues:   Objective:  Vital signs for last 24 hours: Temp:  [98.2 F (36.8 C)-99.6 F (37.6 C)] 98.8 F (37.1 C) (04/26 1200) Pulse Rate:  [87-122] 104 (04/26 1500) Resp:  [14-33] 27 (04/26 1500) BP: (111-160)/(65-92) 111/65 (04/26 1400) SpO2:  [92 %-100 %] 96 % (04/26 1500) FiO2 (%):  [40 %] 40 % (04/26 1434) Weight:  [92.5 kg] 92.5 kg (04/26 0500)  Hemodynamic parameters for last 24 hours:    Intake/Output from previous day: 04/25 0701 - 04/26 0700 In: 2979.1 [P.O.:200; I.V.:139.1; NG/GT:2640] Out: 3590 [Urine:3350; Chest Tube:240]  Intake/Output this shift: No intake/output data recorded.  Vent settings for last 24 hours: Vent Mode: PRVC FiO2 (%):  [40 %] 40 % Set Rate:  [18 bmp] 18 bmp Vt Set:  [480 mL] 480 mL PEEP:  [8 cmH20] 8 cmH20 Pressure Support:  [8 cmH20] 8 cmH20 Plateau Pressure:  [14 cmH20-20 cmH20] 14 cmH20  Physical Exam:  Gen: comfortable, no distress Neuro: non-focal exam HEENT: PERRL CV: RRR Pulm: unlabored breathing Abd: soft, NT GU: clear yellow urine Extr: wwp, 1+ edema   Results for orders placed or performed during the hospital encounter of 08/17/20 (from the past 24 hour(s))  Glucose, capillary     Status: Abnormal   Collection Time: 09/10/20  3:43 PM  Result Value Ref Range   Glucose-Capillary 119 (H) 70 - 99 mg/dL  Glucose, capillary     Status: Abnormal   Collection Time: 09/10/20  7:28 PM  Result Value Ref Range   Glucose-Capillary 139 (H) 70 - 99 mg/dL  Glucose, capillary     Status: Abnormal   Collection Time: 09/10/20 11:54 PM  Result Value Ref Range   Glucose-Capillary 137 (H) 70 - 99 mg/dL  Glucose, capillary     Status: None   Collection Time: 09/11/20  3:36 AM  Result Value Ref Range   Glucose-Capillary 96 70 - 99 mg/dL  CBC     Status: Abnormal   Collection Time: 09/11/20  6:37 AM  Result Value Ref Range   WBC 15.0 (H) 4.0 - 10.5 K/uL   RBC 2.65  (L) 4.22 - 5.81 MIL/uL   Hemoglobin 7.8 (L) 13.0 - 17.0 g/dL   HCT 11.9 (L) 41.7 - 40.8 %   MCV 100.4 (H) 80.0 - 100.0 fL   MCH 29.4 26.0 - 34.0 pg   MCHC 29.3 (L) 30.0 - 36.0 g/dL   RDW 14.4 (H) 81.8 - 56.3 %   Platelets 596 (H) 150 - 400 K/uL   nRBC 0.0 0.0 - 0.2 %  Basic metabolic panel     Status: Abnormal   Collection Time: 09/11/20  6:37 AM  Result Value Ref Range   Sodium 139 135 - 145 mmol/L   Potassium 3.7 3.5 - 5.1 mmol/L   Chloride 98 98 - 111 mmol/L   CO2 33 (H) 22 - 32 mmol/L   Glucose, Bld 120 (H) 70 - 99 mg/dL   BUN 14 6 - 20 mg/dL   Creatinine, Ser 1.49 0.61 - 1.24 mg/dL   Calcium 8.5 (L) 8.9 - 10.3 mg/dL   GFR, Estimated >70 >26 mL/min   Anion gap 8 5 - 15  Glucose, capillary     Status: Abnormal   Collection Time: 09/11/20  7:57 AM  Result Value Ref Range   Glucose-Capillary 139 (H) 70 - 99 mg/dL  Glucose, capillary     Status: Abnormal  Collection Time: 09/11/20 11:46 AM  Result Value Ref Range   Glucose-Capillary 145 (H) 70 - 99 mg/dL    Assessment & Plan: The plan of care was discussed with the bedside nurse for the night, who is in agreement with this plan and no additional concerns were raised.   Present on Admission: . Thoracic spine fracture (HCC)    LOS: 25 days   Additional comments:I reviewed the patient's new clinical lab test results.   and I reviewed the patients new imaging test results.    Fall from height  R PTX, B effusion- B CT on water seal, R 160cc/24h, L 80cc/24h. Remove L tube today Multiple B rib fxs- these are at the junction of the thoracic vertebral bodies, pain control, IS 3 column T8 vertebral body fx-perDr. Jones,non-op now, plan mobilize in brace once off vent. Needs CTO brace for HOB>30 degrees. T7 neural arch fx- per nsgy R TVP T4-8 - pain control, per nsgy R C7 TVP fx through foramen- C collar per Dr. Yetta Barre, CTAnegatvie Large scalp laceration/right eyebrow laceration- repair by Dr. Lovick4/05/2020,  sutures removed Right neck abrasion/skin tear- bacitracin BID Facial ecchymosis- CT maxillofacialdemonstrates no facial bone fractures. Some anterior subluxation of the right mandibular condyle HTN- home lopressor 25mg  daily CAD/H/O MI/DES- on ASA at home. Hold for now. 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 Poor weaning. Trach/PEG 4/18by Dr. 03-05-1974 Rash on back- benadryl Hypotension- resolved Depression/anxiety- home bupropion whentaking PO, home buspar BPH- home terazosin, repeatedly failedvoiding trials, increased bethanachol 4/15. I&O q6h ABL anemia- stable FEN-TF,bowel regimen, lasix x1 VTE-LMWH Dispo- ICU; Medicaid does not have a LTACH benefit so plan vent SNF. Palliative Care also following.  Critical Care Total Time: 45 minutes  5/15, MD Trauma & General Surgery Please use [email or web address] to contact on call provider  09/11/2020  *Care during the described time interval was provided by me. I have reviewed this patient's available data, including medical history, events of note, physical examination and test results as part of my evaluation.

## 2020-09-11 NOTE — Progress Notes (Signed)
Orthopedic Tech Progress Note Patient Details:  Charles Weeks 12-Jul-1965 [id number]  Ortho Devices Type of Ortho Device: Prafo boot/shoe Ortho Device/Splint Location: LLE Ortho Device/Splint Interventions: Ordered   Post Interventions Patient Tolerated: Well Instructions Provided: Care of device   [name] 09/11/2020, 5:48 PM

## 2020-09-11 NOTE — Progress Notes (Signed)
Lt chest tube removed by E Lonnette Shrode RN and A [employer].

## 2020-09-11 NOTE — Progress Notes (Signed)
   09/11/20 1000  Clinical Encounter Type  Visited With Patient and family together  Visit Type Spiritual support  Referral From Nurse  Consult/Referral To Chaplain  Spiritual Encounters  Spiritual Needs Prayer  Stress Factors  Patient Stress Factors Major life changes;Health changes  Family Stress Factors Health changes;Major life changes  Responded to the page that Ms. [name] Mr. Fraleigh's sister, requested to see the Chaplain. Mr. Funari is going through some major health changes and Ms. [name] asked for prayer. Such a positive visit,  Ms. [name] has great hope for the improvement and recovery of her brother.  Chaplain Shed Nixon Morgan-Simpson  [phone]

## 2020-09-12 LAB — BASIC METABOLIC PANEL
Anion gap: 7 (ref 5–15)
BUN: 15 mg/dL (ref 6–20)
CO2: 34 mmol/L — ABNORMAL HIGH (ref 22–32)
Calcium: 8.4 mg/dL — ABNORMAL LOW (ref 8.9–10.3)
Chloride: 95 mmol/L — ABNORMAL LOW (ref 98–111)
Creatinine, Ser: 0.61 mg/dL (ref 0.61–1.24)
GFR, Estimated: >60 mL/min (ref >60)
Glucose, Bld: 134 mg/dL — ABNORMAL HIGH (ref 70–99)
Potassium: 3.6 mmol/L (ref 3.5–5.1)
Sodium: 136 mmol/L (ref 135–145)

## 2020-09-12 LAB — CBC
HCT: 25 % — ABNORMAL LOW (ref 39.0–52.0)
Hemoglobin: 7.6 g/dL — ABNORMAL LOW (ref 13.0–17.0)
MCH: 29.2 pg (ref 26.0–34.0)
MCHC: 30.4 g/dL (ref 30.0–36.0)
MCV: 96.2 fL (ref 80.0–100.0)
Platelets: 660 10*3/uL — ABNORMAL HIGH (ref 150–400)
RBC: 2.6 MIL/uL — ABNORMAL LOW (ref 4.22–5.81)
RDW: 16.6 % — ABNORMAL HIGH (ref 11.5–15.5)
WBC: 12.8 10*3/uL — ABNORMAL HIGH (ref 4.0–10.5)
nRBC: 0 % (ref 0.0–0.2)

## 2020-09-12 LAB — GLUCOSE, CAPILLARY
Glucose-Capillary: 156 mg/dL — ABNORMAL HIGH (ref 70–99)
Glucose-Capillary: 156 mg/dL — ABNORMAL HIGH (ref 70–99)
Glucose-Capillary: 133 mg/dL — ABNORMAL HIGH (ref 70–99)
Glucose-Capillary: 157 mg/dL — ABNORMAL HIGH (ref 70–99)
Glucose-Capillary: 143 mg/dL — ABNORMAL HIGH (ref 70–99)
Glucose-Capillary: 108 mg/dL — ABNORMAL HIGH (ref 70–99)

## 2020-09-12 MED ORDER — FUROSEMIDE 10 MG/ML IJ SOLN
40.0000 mg | Freq: Once | INTRAMUSCULAR | Status: AC
  Administered 2020-09-12: 40 mg via INTRAVENOUS
  Filled 2020-09-12: qty 4

## 2020-09-12 MED ORDER — POTASSIUM CHLORIDE 20 MEQ PO PACK
40.0000 meq | PACK | Freq: Once | ORAL | Status: AC
  Administered 2020-09-12: 40 meq
  Filled 2020-09-12: qty 2

## 2020-09-12 NOTE — Progress Notes (Signed)
Removed sutures from trach per Dr. Janee Morn verbal order.  Changed dressing and trach ties with RN assistance.  Patient tolerated well.

## 2020-09-12 NOTE — Progress Notes (Signed)
Patient ID: Charles Weeks, male   DOB: [birth year], 55 y.o.   MRN: [id number] Follow up - Trauma Critical Care  Patient Details:    Charles Weeks is an 55 y.o. male.  Lines/tubes : Chest Tube Lateral;Right (Active)  Status To water seal 09/12/20 0800  Chest Tube Air Leak None 09/11/20 2000  Patency Intervention Tip/tilt 09/11/20 2000  Drainage Description Serous 09/11/20 2000  Dressing Status Clean;Dry;Intact 09/11/20 2000  Dressing Intervention Dressing changed 09/08/20 0000  Site Assessment Clean;Dry;Intact 09/11/20 2000  Surrounding Skin Unable to view 09/11/20 2000  Output (mL) 0 mL 09/12/20 0600     Gastrostomy/Enterostomy Percutaneous endoscopic gastrostomy (PEG) 20 Fr. LUQ (Active)  Surrounding Skin Dry;Intact 09/11/20 2000  Tube Status Patent 09/11/20 2000  Drainage Appearance None 09/11/20 2000  Dressing Status Clean;Dry;Intact 09/11/20 2000  Dressing Intervention Dressing changed 09/11/20 2000  Dressing Type Split gauze;Abdominal Binder 09/11/20 2000     External Urinary Catheter (Active)  Collection Container Standard drainage bag 09/11/20 2000  Suction (Verified suction is between 40-80 mmHg) Yes 09/09/20 2000  Securement Method Tape 09/10/20 2000  Site Assessment Clean;Intact 09/11/20 2000  Intervention Equipment Changed 09/10/20 2000  Output (mL) 625 mL 09/12/20 0600    Microbiology/Sepsis markers: Results for orders placed or performed during the hospital encounter of 08/17/20  Resp Panel by RT-PCR (Flu A&B, Covid) Nasopharyngeal Swab     Status: None   Collection Time: 08/17/20  3:40 PM   Specimen: Nasopharyngeal Swab; Nasopharyngeal(NP) swabs in vial transport medium  Result Value Ref Range Status   SARS Coronavirus 2 by RT PCR NEGATIVE NEGATIVE Final    Comment: (NOTE) SARS-CoV-2 target nucleic acids are NOT DETECTED.  The SARS-CoV-2 RNA is generally detectable in upper respiratory specimens during the acute phase of infection. The  lowest concentration of SARS-CoV-2 viral copies this assay can detect is 138 copies/mL. A negative result does not preclude SARS-Cov-2 infection and should not be used as the sole basis for treatment or other patient management decisions. A negative result may occur with  improper specimen collection/handling, submission of specimen other than nasopharyngeal swab, presence of viral mutation(s) within the areas targeted by this assay, and inadequate number of viral copies(<138 copies/mL). A negative result must be combined with clinical observations, patient history, and epidemiological information. The expected result is Negative.  Fact Sheet for Patients:  BloggerCourse.com  Fact Sheet for Healthcare Providers:  SeriousBroker.it  This test is no t yet approved or cleared by the [place] FDA and  has been authorized for detection and/or diagnosis of SARS-CoV-2 by FDA under an Emergency Use Authorization (EUA). This EUA will remain  in effect (meaning this test can be used) for the duration of the COVID-19 declaration under Section 564(b)(1) of the Act, 21 U.S.C.section 360bbb-3(b)(1), unless the authorization is terminated  or revoked sooner.       Influenza A by PCR NEGATIVE NEGATIVE Final   Influenza B by PCR NEGATIVE NEGATIVE Final    Comment: (NOTE) The Xpert Xpress SARS-CoV-2/FLU/RSV plus assay is intended as an aid in the diagnosis of influenza from Nasopharyngeal swab specimens and should not be used as a sole basis for treatment. Nasal washings and aspirates are unacceptable for Xpert Xpress SARS-CoV-2/FLU/RSV testing.  Fact Sheet for Patients: BloggerCourse.com  Fact Sheet for Healthcare Providers: SeriousBroker.it  This test is not yet approved or cleared by the [place] FDA and has been authorized for detection and/or diagnosis of SARS-CoV-2 by FDA under  an Emergency Use Authorization (EUA). This  EUA will remain in effect (meaning this test can be used) for the duration of the COVID-19 declaration under Section 564(b)(1) of the Act, 21 U.S.C. section 360bbb-3(b)(1), unless the authorization is terminated or revoked.  Performed at [facility], [address], [place], [place] [address]   Surgical PCR screen     Status: None   Collection Time: 08/19/20  7:38 AM   Specimen: Nasal Mucosa; Nasal Swab  Result Value Ref Range Status   MRSA, PCR NEGATIVE NEGATIVE Final   Staphylococcus aureus NEGATIVE NEGATIVE Final    Comment: (NOTE) The Xpert SA Assay (FDA approved for NASAL specimens in patients 61 years of age and older), is one component of a comprehensive surveillance program. It is not intended to diagnose infection nor to guide or monitor treatment. Performed at [facility], [address] [place], [place] [address]     Anti-infectives:  Anti-infectives (From admission, onward)   Start     Dose/Rate Route Frequency Ordered Stop   08/19/20 0845  cefTRIAXone (ROCEPHIN) 2 g in sodium chloride 0.9 % 100 mL IVPB  Status:  Discontinued        2 g 200 mL/hr over 30 Minutes Intravenous Every 24 hours 08/19/20 0753 08/25/20 1304   08/17/20 1515  ceFAZolin (ANCEF) IVPB 2g/100 mL premix        2 g 200 mL/hr over 30 Minutes Intravenous  Once 08/17/20 1500 08/17/20 1530      Best Practice/Protocols:  VTE Prophylaxis: Lovenox (prophylaxtic dose) Continous Sedation  Consults: Treatment Team:  Tia Alert, MD    Studies:    Events:  Subjective:    Overnight Issues:   Objective:  Vital signs for last 24 hours: Temp:  [98.2 F (36.8 C)-100 F (37.8 C)] 100 F (37.8 C) (04/27 0400) Pulse Rate:  [87-118] 110 (04/27 0800) Resp:  [17-29] 18 (04/27 0800) BP: (110-138)/(64-110) 128/89 (04/27 0800) SpO2:  [90 %-100 %] 96 % (04/27 0800) FiO2 (%):  [40 %] 40 % (04/27 0800) Weight:  [92.4 kg] 92.4 kg (04/27  0500)  Hemodynamic parameters for last 24 hours:    Intake/Output from previous day: 04/26 0701 - 04/27 0700 In: 2208.1 [I.V.:228.1; NG/GT:1980] Out: 3225 [Urine:3075; Chest Tube:150]  Intake/Output this shift: No intake/output data recorded.  Vent settings for last 24 hours: Vent Mode: CPAP;PSV FiO2 (%):  [40 %] 40 % Set Rate:  [18 bmp] 18 bmp Vt Set:  [480 mL] 480 mL PEEP:  [8 cmH20] 8 cmH20 Pressure Support:  [8 cmH20] 8 cmH20 Plateau Pressure:  [14 cmH20-16 cmH20] 14 cmH20  Physical Exam:  General: alert and on vent Neuro: f/c well HEENT/Neck: trach-clean, intact Resp: clear to auscultation bilaterally CVS: RRR GI: soft, NT, TLSO on Extremities: edema 1+  Results for orders placed or performed during the hospital encounter of 08/17/20 (from the past 24 hour(s))  Glucose, capillary     Status: Abnormal   Collection Time: 09/11/20 11:46 AM  Result Value Ref Range   Glucose-Capillary 145 (H) 70 - 99 mg/dL  Glucose, capillary     Status: Abnormal   Collection Time: 09/11/20  3:41 PM  Result Value Ref Range   Glucose-Capillary 117 (H) 70 - 99 mg/dL  Glucose, capillary     Status: Abnormal   Collection Time: 09/11/20  7:44 PM  Result Value Ref Range   Glucose-Capillary 133 (H) 70 - 99 mg/dL  Glucose, capillary     Status: Abnormal   Collection Time: 09/11/20 11:29  PM  Result Value Ref Range   Glucose-Capillary 132 (H) 70 - 99 mg/dL  Glucose, capillary     Status: Abnormal   Collection Time: 09/12/20  3:37 AM  Result Value Ref Range   Glucose-Capillary 156 (H) 70 - 99 mg/dL  CBC     Status: Abnormal   Collection Time: 09/12/20  5:29 AM  Result Value Ref Range   WBC 12.8 (H) 4.0 - 10.5 K/uL   RBC 2.60 (L) 4.22 - 5.81 MIL/uL   Hemoglobin 7.6 (L) 13.0 - 17.0 g/dL   HCT 12.2 (L) 48.2 - 50.0 %   MCV 96.2 80.0 - 100.0 fL   MCH 29.2 26.0 - 34.0 pg   MCHC 30.4 30.0 - 36.0 g/dL   RDW 37.0 (H) 48.8 - 89.1 %   Platelets 660 (H) 150 - 400 K/uL   nRBC 0.0 0.0 - 0.2 %   Basic metabolic panel     Status: Abnormal   Collection Time: 09/12/20  5:29 AM  Result Value Ref Range   Sodium 136 135 - 145 mmol/L   Potassium 3.6 3.5 - 5.1 mmol/L   Chloride 95 (L) 98 - 111 mmol/L   CO2 34 (H) 22 - 32 mmol/L   Glucose, Bld 134 (H) 70 - 99 mg/dL   BUN 15 6 - 20 mg/dL   Creatinine, Ser 6.94 0.61 - 1.24 mg/dL   Calcium 8.4 (L) 8.9 - 10.3 mg/dL   GFR, Estimated >50 >38 mL/min   Anion gap 7 5 - 15  Glucose, capillary     Status: Abnormal   Collection Time: 09/12/20  7:06 AM  Result Value Ref Range   Glucose-Capillary 156 (H) 70 - 99 mg/dL    Assessment & Plan: Present on Admission: . Thoracic spine fracture (HCC)    LOS: 26 days   Additional comments:I reviewed the patient's new clinical lab test results. . Fall from height  R PTX, B effusion- R CT remains until output <100 for 24h Multiple B rib fxs- these are at the junction of the thoracic vertebral bodies, pain control, IS 3 column T8 vertebral body fx-perDr. Jones,non-op now, plan mobilize in brace once off vent. Needs CTO brace for HOB>30 degrees. T7 neural arch fx- per nsgy R TVP T4-8 - pain control, per nsgy R C7 TVP fx through foramen- C collar per Dr. Yetta Barre, CTAnegatvie Large scalp laceration/right eyebrow laceration- repair by Dr. Lovick4/05/2020, sutures removed Right neck abrasion/skin tear- bacitracin BID Facial ecchymosis- CT maxillofacialdemonstrates no facial bone fractures. Some anterior subluxation of the right mandibular condyle HTN- home lopressor 25mg  daily CAD/H/O MI/DES- on ASA at home. Hold for now. 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 Poor weaning. Trach/PEG 4/18by Dr. 03-05-1974 Rash on back- benadryl Hypotension- resolved Depression/anxiety- home bupropion whentaking PO, home buspar BPH- home terazosin,  repeatedly failedvoiding trials, increased bethanachol 4/15. I&O q6h ABL anemia- stable FEN-TF,bowel regimen, lasix x1 VTE-LMWH Dispo- ICU;  plan vent SNF. Palliative Care also following. I had a frank discussion with his sister at the bedside. She expressed she wants to take him home on the vent. While she thinks she knows what that entails, at this point the resources that requires are not in place. His daughters are his decision makers and, reportedly, are agreeable vent SNF placement. Charles Weeks's sister is unhappy with this plan. I reiterated, again, that Charles Weeks's daughters are the decision makers. Appreciate the TOC team's on  going work on this. Critical Care Total Time*: 34 Minutes  Violeta Gelinas, MD, MPH, FACS Trauma & General Surgery Use [email or web address] to contact on call provider  09/12/2020  *Care during the described time interval was provided by me. I have reviewed this patient's available data, including medical history, events of note, physical examination and test results as part of my evaluation.

## 2020-09-12 NOTE — Progress Notes (Signed)
I reviewed the CT scan of the cervical and the thoracic spine.  I agree with the reports.  I do not see a fracture in the cervical spine.  When he is awake enough to follow commands and protect himself for flexion-extension views he can undergo flexion-extension's to remove the collar.  Could consider an MRI to rule out ligamentous injury but I think this may be difficult on the ventilator.  Review of the CT scan of the thoracic spine does show T8 and T9 vertebral body fractures with fractures through the spinous processes.  The facets appear to be okay.  The T8-9 facet on the left potentially could be slightly distracted.  While these fractures are significant there is no retropulsion or canal stenosis, it has been 4 weeks now of healing, and I suspect these fractures will be stable enough to heal in a brace.  I do not believe these represent chance fractures as once thought earlier, and therefore I think the risk of a large thoracic stabilization procedure outweigh the risks of healing in the brace at this point, especially given his comorbidities.

## 2020-09-13 LAB — BASIC METABOLIC PANEL
Anion gap: 9 (ref 5–15)
BUN: 15 mg/dL (ref 6–20)
CO2: 33 mmol/L — ABNORMAL HIGH (ref 22–32)
Calcium: 8.6 mg/dL — ABNORMAL LOW (ref 8.9–10.3)
Chloride: 94 mmol/L — ABNORMAL LOW (ref 98–111)
Creatinine, Ser: 0.65 mg/dL (ref 0.61–1.24)
GFR, Estimated: >60 mL/min (ref >60)
Glucose, Bld: 131 mg/dL — ABNORMAL HIGH (ref 70–99)
Potassium: 3.6 mmol/L (ref 3.5–5.1)
Sodium: 136 mmol/L (ref 135–145)

## 2020-09-13 LAB — GLUCOSE, CAPILLARY
Glucose-Capillary: 126 mg/dL — ABNORMAL HIGH (ref 70–99)
Glucose-Capillary: 129 mg/dL — ABNORMAL HIGH (ref 70–99)
Glucose-Capillary: 137 mg/dL — ABNORMAL HIGH (ref 70–99)
Glucose-Capillary: 139 mg/dL — ABNORMAL HIGH (ref 70–99)
Glucose-Capillary: 154 mg/dL — ABNORMAL HIGH (ref 70–99)
Glucose-Capillary: 97 mg/dL (ref 70–99)

## 2020-09-13 MED ORDER — POTASSIUM CHLORIDE 20 MEQ PO PACK
40.0000 meq | PACK | Freq: Once | ORAL | Status: AC
  Administered 2020-09-13: 40 meq via ORAL
  Filled 2020-09-13: qty 2

## 2020-09-13 MED ORDER — FUROSEMIDE 10 MG/ML IJ SOLN
40.0000 mg | Freq: Once | INTRAMUSCULAR | Status: AC
  Administered 2020-09-13: 40 mg via INTRAVENOUS
  Filled 2020-09-13: qty 4

## 2020-09-13 MED ORDER — GUAIFENESIN 100 MG/5ML PO SOLN
15.0000 mL | ORAL | Status: DC
  Administered 2020-09-13 – 2020-09-24 (×66): 300 mg
  Filled 2020-09-13 (×4): qty 15
  Filled 2020-09-13: qty 45
  Filled 2020-09-13 (×59): qty 15

## 2020-09-13 MED ORDER — SCOPOLAMINE 1 MG/3DAYS TD PT72
1.0000 | MEDICATED_PATCH | TRANSDERMAL | Status: DC
  Administered 2020-09-13 – 2020-10-07 (×9): 1.5 mg via TRANSDERMAL
  Filled 2020-09-13 (×10): qty 1

## 2020-09-13 NOTE — Progress Notes (Signed)
Physical Therapy Treatment Patient Details Name: Charles Weeks MRN: [id number] DOB: [birth year] Today's Date: 09/13/2020    History of Present Illness 55 y.o. male who presented 4/1 after falling down about 11 stairs and sustained R PTX, anterior subluxation of R mandibular condyle, multiple bil ribs fxs at junction of thoracic vertebral bodies T5-8 on R and T7-8 on L, 3 column T8 vertebral body fx, T7 neural arch fx, R TVP T4-8 fxs, and R C7 TVP fx through foramen. R chest tube inserted 4/1. Pt found to be hypoxic and was intubated 4/2. No evidence of vascular injury to neck, facial fx, acute intracranial injury, lumbar spine fx, R elbow acute abnormality, or pelvic fx identified on imaging. S/p trach and PEG placement 4/18. S/p L chest tube placement 4/19. PMH: HTN, COPD, CAD, s/p MI with DES RCA in Jan 2020 just on ASA, depression/anxiety, and BPH.    PT Comments    The pt was able to demo good progress with PT goals of OOB mobility and activity tolerance today as he was able to maintain sitting EOB with minG to minA without LOB for ~20 min, and able to tolerate standing with modA of 2 for 2-3 min. The pt does require significant assist to initiate and complete movements in bed to allow for log roll to don/doff brace, and requires assist of 2 for OOB transfers, but remained engaged and participatory despite weakness throughout today's session. The pt did demo increased RR (to 40s) with fatigue and activity, RT present and transitioned vent settings back to full support during session. Will continue to progress OOB and activity tolerance as well as strength to improve pt participation and ability to reposition in bed.     Follow Up Recommendations  Supervision/Assistance - 24 hour;SNF     Equipment Recommendations  Wheelchair (measurements PT);Wheelchair cushion (measurements PT);Hospital bed;Other (comment) (hoyer)    Recommendations for Other Services       Precautions / Restrictions  Precautions Precautions: Cervical;Back Precaution Booklet Issued: No Precaution Comments: Bil chest tube, trach on vent, TLSO donned with rolling if HOB > 30 deg, cervical collar at all times, abdominal binder to protect PEG Cervical Brace: Hard collar;At all times Spinal Brace: Thoracolumbosacral orthotic;Applied in supine position Restrictions Weight Bearing Restrictions: No    Mobility  Bed Mobility Overal bed mobility: Needs Assistance Bed Mobility: Rolling;Sidelying to Sit;Sit to Sidelying Rolling: Mod assist Sidelying to sit: Max assist;+2 for physical assistance     Sit to sidelying: +2 for physical assistance;Max assist General bed mobility comments: Requiring max A + 2 for managing BLEs and trunk. following commands to roll, push into sitting, and lean down on elbow. Mod A for rolling with use of rail (assist to reach and locate rail)    Transfers Overall transfer level: Needs assistance Equipment used: None Transfers: Sit to/from Stand Sit to Stand: Max assist;+2 safety/equipment         General transfer comment: MaxA + 2 to power up into standing. Pt maintaining standing for ~30 second for peri care  Ambulation/Gait             General Gait Details: pt unable to take steps at this time, fatigue and pain       Balance Overall balance assessment: Needs assistance Sitting-balance support: Bilateral upper extremity supported;Feet supported Sitting balance-Leahy Scale: Poor Sitting balance - Comments: Sitting at EOB with Min Guard-Min A. Close Min Guard as pt with tencency to lean forward. No LOB   Standing balance support: Bilateral  upper extremity supported Standing balance-Leahy Scale: Poor Standing balance comment: Reliant on Max A +2                            Cognition Arousal/Alertness: Awake/alert Behavior During Therapy: Flat affect Overall Cognitive Status: Impaired/Different from baseline Area of Impairment: Attention;Following  commands;Safety/judgement;Rancho level;Awareness;Memory;Problem solving               Rancho Levels of Cognitive Functioning Rancho Los Amigos Scales of Cognitive Functioning: Confused/inappropriate/non-agitated   Current Attention Level: Sustained Memory: Decreased short-term memory Following Commands: Follows one step commands with increased time;Follows one step commands consistently Safety/Judgement: Decreased awareness of safety;Decreased awareness of deficits Awareness: Intellectual Problem Solving: Requires verbal cues General Comments: Pt with increased arousal this session. Following one step commands consistently. Pt able to sustain attention to perform oral care. UE fatiguing and requiring assistance. Benefits from calm environment and cues.      Exercises      General Comments General comments (skin integrity, edema, etc.): VSS on vent; pt with elevating RR. Notified RT who was present for parts of session      Pertinent Vitals/Pain Pain Assessment: Faces Faces Pain Scale: Hurts little more Pain Location: grimacing with coughing Pain Descriptors / Indicators: Grimacing;Restless Pain Intervention(s): Limited activity within patient's tolerance           PT Goals (current goals can now be found in the care plan section) Acute Rehab PT Goals Patient Stated Goal: agreeable to session, then to lay back down due to pain PT Goal Formulation: Patient unable to participate in goal setting Time For Goal Achievement: 09/19/20 Potential to Achieve Goals: Fair Progress towards PT goals: Progressing toward goals    Frequency    Min 3X/week      PT Plan Current plan remains appropriate    Co-evaluation PT/OT/SLP Co-Evaluation/Treatment: Yes Reason for Co-Treatment: Necessary to address cognition/behavior during functional activity;For patient/therapist safety;To address functional/ADL transfers PT goals addressed during session: Mobility/safety with  mobility;Balance;Strengthening/ROM OT goals addressed during session: ADL's and self-care      AM-PAC PT "6 Clicks" Mobility   Outcome Measure  Help needed turning from your back to your side while in a flat bed without using bedrails?: Total Help needed moving from lying on your back to sitting on the side of a flat bed without using bedrails?: Total Help needed moving to and from a bed to a chair (including a wheelchair)?: Total Help needed standing up from a chair using your arms (e.g., wheelchair or bedside chair)?: Total Help needed to walk in hospital room?: Total Help needed climbing 3-5 steps with a railing? : Total 6 Click Score: 6    End of Session Equipment Utilized During Treatment: Cervical collar;Back brace;Oxygen Activity Tolerance: Patient limited by pain Patient left: in bed;with call bell/phone within reach;with bed alarm set;with nursing/sitter in room Nurse Communication: Mobility status PT Visit Diagnosis: Muscle weakness (generalized) (M62.81);Difficulty in walking, not elsewhere classified (R26.2)     Time: 7829-5621 PT Time Calculation (min) (ACUTE ONLY): 44 min  Charges:  $Therapeutic Activity: 8-22 mins                     Rolm Baptise, PT, DPT   Acute Rehabilitation Department Pager #: [phone]   Gaetana Michaelis 09/13/2020, 6:39 PM

## 2020-09-13 NOTE — TOC Progression Note (Signed)
Transition of Care [facility]) - Progression Note    Patient Details  Name: [name] MRN: [id number] Date of Birth: 04/20/1966  [facility] CM/SW Contact  Glennon Mac, RN Phone Number: 09/13/2020, 1:15 PM  Clinical Narrative:   Spoke with patient's daughter, [name], by phone to discuss discharge planning.  She states she has talked with her sister, and they would like patient to discharge home with Chip Boer as patient's main caregiver.  Patient's 2 daughters and [name]'s daughter will also help with care, per [name].  She states that they do not want to place patient in a facility.  Will discuss family's decision with Trauma Team and begin process for home vent.    Expected Discharge Plan: Skilled Nursing Facility Barriers to Discharge: Continued Medical Work up  Expected Discharge Plan and Services Expected Discharge Plan: Skilled Nursing Facility   Discharge Planning Services: CM Consult   Living arrangements for the past 2 months: Single Family Home                                       Social Determinants of Health (SDOH) Interventions    Readmission Risk Interventions No flowsheet data found.  Quintella Baton, RN, BSN  Trauma/Neuro ICU Case Manager [phone]

## 2020-09-13 NOTE — Progress Notes (Signed)
Patient ID: Charles Weeks, male   DOB: 1965/11/13, 55 y.o.   MRN: [id number] Follow up - Trauma Critical Care  Patient Details:    Charles Weeks is an 55 y.o. male.  Lines/tubes : Chest Tube Lateral;Right (Active)  Status To water seal 09/12/20 2000  Chest Tube Air Leak None 09/12/20 2000  Patency Intervention Charles/tilt 09/12/20 2000  Drainage Description Serous 09/12/20 2000  Dressing Status Clean;Dry;Intact 09/12/20 2000  Dressing Intervention Dressing changed 09/08/20 0000  Site Assessment Clean;Dry;Intact 09/11/20 2000  Surrounding Skin Unable to view 09/12/20 2000  Output (mL) 80 mL 09/13/20 0530     Gastrostomy/Enterostomy Percutaneous endoscopic gastrostomy (PEG) 20 Fr. LUQ (Active)  Surrounding Skin Dry;Intact 09/12/20 2000  Tube Status Patent 09/12/20 2000  Drainage Appearance None 09/11/20 2000  Dressing Status Clean;Dry;Intact 09/12/20 2000  Dressing Intervention Dressing changed 09/11/20 2000  Dressing Type Split gauze;Abdominal Binder 09/12/20 2000     External Urinary Catheter (Active)  Collection Container Standard drainage bag 09/12/20 2000  Suction (Verified suction is between 40-80 mmHg) Yes 09/09/20 2000  Securement Method Tape 09/12/20 2000  Site Assessment Clean;Intact 09/12/20 2000  Intervention Equipment Changed 09/10/20 2000  Output (mL) 675 mL 09/13/20 0530    Microbiology/Sepsis markers: Results for orders placed or performed during the hospital encounter of 08/17/20  Resp Panel by RT-PCR (Flu A&B, Covid) Nasopharyngeal Swab     Status: None   Collection Time: 08/17/20  3:40 PM   Specimen: Nasopharyngeal Swab; Nasopharyngeal(NP) swabs in vial transport medium  Result Value Ref Range Status   SARS Coronavirus 2 by RT PCR NEGATIVE NEGATIVE Final    Comment: (NOTE) SARS-CoV-2 target nucleic acids are NOT DETECTED.  The SARS-CoV-2 RNA is generally detectable in upper respiratory specimens during the acute phase of infection. The  lowest concentration of SARS-CoV-2 viral copies this assay can detect is 138 copies/mL. A negative result does not preclude SARS-Cov-2 infection and should not be used as the sole basis for treatment or other patient management decisions. A negative result may occur with  improper specimen collection/handling, submission of specimen other than nasopharyngeal swab, presence of viral mutation(s) within the areas targeted by this assay, and inadequate number of viral copies(<138 copies/mL). A negative result must be combined with clinical observations, patient history, and epidemiological information. The expected result is Negative.  Fact Sheet for Patients:  BloggerCourse.com  Fact Sheet for Healthcare Providers:  SeriousBroker.it  This test is no t yet approved or cleared by the [place] FDA and  has been authorized for detection and/or diagnosis of SARS-CoV-2 by FDA under an Emergency Use Authorization (EUA). This EUA will remain  in effect (meaning this test can be used) for the duration of the COVID-19 declaration under Section 564(b)(1) of the Act, 21 U.S.C.section 360bbb-3(b)(1), unless the authorization is terminated  or revoked sooner.       Influenza A by PCR NEGATIVE NEGATIVE Final   Influenza B by PCR NEGATIVE NEGATIVE Final    Comment: (NOTE) The Xpert Xpress SARS-CoV-2/FLU/RSV plus assay is intended as an aid in the diagnosis of influenza from Nasopharyngeal swab specimens and should not be used as a sole basis for treatment. Nasal washings and aspirates are unacceptable for Xpert Xpress SARS-CoV-2/FLU/RSV testing.  Fact Sheet for Patients: BloggerCourse.com  Fact Sheet for Healthcare Providers: SeriousBroker.it  This test is not yet approved or cleared by the [place] FDA and has been authorized for detection and/or diagnosis of SARS-CoV-2 by FDA under  an Emergency Use Authorization (EUA). This  EUA will remain in effect (meaning this test can be used) for the duration of the COVID-19 declaration under Section 564(b)(1) of the Act, 21 U.S.C. section 360bbb-3(b)(1), unless the authorization is terminated or revoked.  Performed at [facility], [address] [place], [place] [address]   Surgical PCR screen     Status: None   Collection Time: 08/19/20  7:38 AM   Specimen: Nasal Mucosa; Nasal Swab  Result Value Ref Range Status   MRSA, PCR NEGATIVE NEGATIVE Final   Staphylococcus aureus NEGATIVE NEGATIVE Final    Comment: (NOTE) The Xpert SA Assay (FDA approved for NASAL specimens in patients 24 years of age and older), is one component of a comprehensive surveillance program. It is not intended to diagnose infection nor to guide or monitor treatment. Performed at [facility], [address] [place], [place] [address]     Anti-infectives:  Anti-infectives (From admission, onward)   Start     Dose/Rate Route Frequency Ordered Stop   08/19/20 0845  cefTRIAXone (ROCEPHIN) 2 g in sodium chloride 0.9 % 100 mL IVPB  Status:  Discontinued        2 g 200 mL/hr over 30 Minutes Intravenous Every 24 hours 08/19/20 0753 08/25/20 1304   08/17/20 1515  ceFAZolin (ANCEF) IVPB 2g/100 mL premix        2 g 200 mL/hr over 30 Minutes Intravenous  Once 08/17/20 1500 08/17/20 1530      Best Practice/Protocols:  VTE Prophylaxis: Lovenox (prophylaxtic dose) Continous Sedation  Consults: Treatment Team:  Tia Alert, MD    Studies:    Events:  Subjective:    Overnight Issues:   Objective:  Vital signs for last 24 hours: Temp:  [97.9 F (36.6 C)-100.1 F (37.8 C)] 98.7 F (37.1 C) (04/28 0400) Pulse Rate:  [96-121] 101 (04/28 0723) Resp:  [16-31] 23 (04/28 0723) BP: (109-135)/(62-105) 125/84 (04/28 0723) SpO2:  [89 %-98 %] 93 % (04/28 0723) FiO2 (%):  [40 %] 40 % (04/28 0723) Weight:  [92 kg] 92 kg (04/28  0500)  Hemodynamic parameters for last 24 hours:    Intake/Output from previous day: 04/27 0701 - 04/28 0700 In: 1840 [NG/GT:1840] Out: 3565 [Urine:3475; Chest Tube:90]  Intake/Output this shift: No intake/output data recorded.  Vent settings for last 24 hours: Vent Mode: CPAP;PSV FiO2 (%):  [40 %] 40 % Set Rate:  [18 bmp] 18 bmp Vt Set:  [480 mL] 480 mL PEEP:  [8 cmH20] 8 cmH20 Pressure Support:  [8 cmH20-12 cmH20] 12 cmH20 Plateau Pressure:  [14 cmH20-24 cmH20] 16 cmH20  Physical Exam:  General: no respiratory distress Neuro: f/c HEENT/Neck: trach-clean, intact Resp: clear to auscultation bilaterally CVS: RRR GI: soft, nontender, BS WNL, no r/g Extremities: edema 2+  Results for orders placed or performed during the hospital encounter of 08/17/20 (from the past 24 hour(s))  Glucose, capillary     Status: Abnormal   Collection Time: 09/12/20 12:04 PM  Result Value Ref Range   Glucose-Capillary 133 (H) 70 - 99 mg/dL  Glucose, capillary     Status: Abnormal   Collection Time: 09/12/20  4:30 PM  Result Value Ref Range   Glucose-Capillary 157 (H) 70 - 99 mg/dL  Glucose, capillary     Status: Abnormal   Collection Time: 09/12/20  7:29 PM  Result Value Ref Range   Glucose-Capillary 143 (H) 70 - 99 mg/dL  Glucose, capillary     Status: Abnormal   Collection Time: 09/12/20 11:27  PM  Result Value Ref Range   Glucose-Capillary 108 (H) 70 - 99 mg/dL  Basic metabolic panel     Status: Abnormal   Collection Time: 09/13/20  3:13 AM  Result Value Ref Range   Sodium 136 135 - 145 mmol/L   Potassium 3.6 3.5 - 5.1 mmol/L   Chloride 94 (L) 98 - 111 mmol/L   CO2 33 (H) 22 - 32 mmol/L   Glucose, Bld 131 (H) 70 - 99 mg/dL   BUN 15 6 - 20 mg/dL   Creatinine, Ser 8.46 0.61 - 1.24 mg/dL   Calcium 8.6 (L) 8.9 - 10.3 mg/dL   GFR, Estimated >65 >99 mL/min   Anion gap 9 5 - 15  Glucose, capillary     Status: Abnormal   Collection Time: 09/13/20  3:21 AM  Result Value Ref Range    Glucose-Capillary 137 (H) 70 - 99 mg/dL  Glucose, capillary     Status: Abnormal   Collection Time: 09/13/20  8:13 AM  Result Value Ref Range   Glucose-Capillary 154 (H) 70 - 99 mg/dL    Assessment & Plan: Present on Admission: . Thoracic spine fracture (HCC)    LOS: 27 days   Additional comments:I reviewed the patient's new clinical lab test results. and CXR Fall from height  R PTX, B effusion- R CT remains until output <100 for 24h Multiple B rib fxs- these are at the junction of the thoracic vertebral bodies, pain control, IS 3 column T8 vertebral body fx-perDr. Jones,non-op now, plan mobilize in brace once off vent. Needs CTO brace for HOB>30 degrees. T7 neural arch fx- per nsgy R TVP T4-8 - pain control, per nsgy R C7 TVP fx through foramen- C collar per Dr. Yetta Barre, CTAnegatvie Large scalp laceration/right eyebrow laceration- repair by Dr. Lovick4/05/2020, sutures removed Right neck abrasion/skin tear- bacitracin BID Facial ecchymosis- CT maxillofacialdemonstrates no facial bone fractures. Some anterior subluxation of the right mandibular condyle HTN- home lopressor 25mg  daily CAD/H/O MI/DES- on ASA at home. Hold for now. 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 Poor weaning. Trach/PEG 4/18by Dr. 03-05-1974. Weaning on 12/8 now. Rash on back- benadryl Hypotension- resolved Depression/anxiety- home bupropion whentaking PO, home buspar BPH- home terazosin, repeatedly failedvoiding trials, increased bethanachol 4/15. I&O q6h ABL anemia- stable FEN-TF,bowel regimen, lasix x1 and replete mild hypokalemia VTE-LMWH Dispo- ICU;  plan vent SNF.  Increase guaifenesin and add scop patch for thick secretions. I spoke with his sister at the bedside and updated her. Critical Care Total Time*: 32 Minutes  5/15, MD, MPH, FACS Trauma & General Surgery Use [email or web address] to contact on call provider  09/13/2020  *Care during the described time interval was provided by me. I have reviewed this patient's available data, including medical history, events of note, physical examination and test results as part of my evaluation.

## 2020-09-13 NOTE — Progress Notes (Signed)
Occupational Therapy Treatment Patient Details Name: Charles Weeks MRN: [id number] DOB: [birth year] Today's Date: 09/13/2020    History of present illness 55 y.o. male who presented 4/1 after falling down about 11 stairs and sustained R PTX, anterior subluxation of R mandibular condyle, multiple bil ribs fxs at junction of thoracic vertebral bodies T5-8 on R and T7-8 on L, 3 column T8 vertebral body fx, T7 neural arch fx, R TVP T4-8 fxs, and R C7 TVP fx through foramen. R chest tube inserted 4/1. Pt found to be hypoxic and was intubated 4/2. No evidence of vascular injury to neck, facial fx, acute intracranial injury, lumbar spine fx, R elbow acute abnormality, or pelvic fx identified on imaging. S/p trach and PEG placement 4/18. S/p L chest tube placement 4/19. PMH: HTN, COPD, CAD, s/p MI with DES RCA in Jan 2020 just on ASA, depression/anxiety, and BPH.   OT comments  Pt progressing towards established OT goals and presenting with increased arousal and following of commands this session. Pt requiring Mod-Max A +2 for bed mobility and sitting at EOB with Min Guard-Min A +2. Pt performing oral care while sitting at EOB with Min A. Pt fatigues and required rest break. Benefits from calm cues and environment. Pt performing sit<>stand from EOB with Max A +2. VSS on vent; RR elevated at EOB and notified RT who adjusted vent settings as needed. Pt presenting at [facility] level V with increased arousal, following simple cues, and continued confusion. Continue to recommend dc to SNF and will continue to follow acutely as admitted.    Follow Up Recommendations  LTACH;SNF    Equipment Recommendations  None recommended by OT    Recommendations for Other Services      Precautions / Restrictions Precautions Precautions: Cervical;Back Precaution Booklet Issued: No Precaution Comments: Bil chest tube, trach on vent, TLSO donned with rolling if HOB > 30 deg, cervical collar at all times, abdominal binder to  protect PEG Cervical Brace: Hard collar;At all times Spinal Brace: Thoracolumbosacral orthotic;Applied in supine position       Mobility Bed Mobility Overal bed mobility: Needs Assistance Bed Mobility: Rolling;Sidelying to Sit;Sit to Sidelying Rolling: Mod assist Sidelying to sit: Max assist;+2 for physical assistance     Sit to sidelying: +2 for physical assistance;Max assist General bed mobility comments: Requiring max A + 2 for managing BLEs and trunk. following commands to roll, push into sitting, and lean down on elbow. Mod A for rolling with use of rail (assist to reach and locate rail)    Transfers Overall transfer level: Needs assistance Equipment used: None Transfers: Sit to/from Stand Sit to Stand: Max assist;+2 safety/equipment         General transfer comment: MaxA + 2 to power up into standing. Pt maintaining standing for ~30 second for peri care    Balance Overall balance assessment: Needs assistance Sitting-balance support: Bilateral upper extremity supported;Feet supported Sitting balance-Leahy Scale: Poor Sitting balance - Comments: Sitting at EOB with Min Guard-Min A. Close Min Guard as pt with tencency to lean forward. No LOB   Standing balance support: Bilateral upper extremity supported Standing balance-Leahy Scale: Poor Standing balance comment: Reliant on Max A +2                           ADL either performed or assessed with clinical judgement   ADL Overall ADL's : Needs assistance/impaired     Grooming: Oral care;Minimal assistance;Sitting Grooming Details (indicate cue  type and reason): Min A for sitting balance at EOB. Min A for coordinating movement, once pt bringing brush/suction to mouth, able brush back and forth.     Lower Body Bathing: Total assistance;Bed level Lower Body Bathing Details (indicate cue type and reason): Total A for donning socks Upper Body Dressing : Maximal assistance;Bed level Upper Body Dressing  Details (indicate cue type and reason): Max A to don brace while at bed level         Toileting- Clothing Manipulation and Hygiene: +2 for physical assistance;Sit to/from stand;Total assistance Toileting - Clothing Manipulation Details (indicate cue type and reason): Sit<>stand with Max A +2 and then third person to perform peri care with total A     Functional mobility during ADLs: Maximal assistance;+2 for physical assistance (sit<>Stand at EOB) General ADL Comments: Total A for ADLs. Not engaging to wipe mouth     Vision       Perception     Praxis      Cognition Arousal/Alertness: Awake/alert Behavior During Therapy: Flat affect Overall Cognitive Status: Impaired/Different from baseline Area of Impairment: Attention;Following commands;Safety/judgement;Rancho level;Awareness;Memory;Problem solving               Rancho Levels of Cognitive Functioning Rancho Los Amigos Scales of Cognitive Functioning: Confused/inappropriate/non-agitated   Current Attention Level: Sustained Memory: Decreased short-term memory Following Commands: Follows one step commands with increased time;Follows one step commands consistently Safety/Judgement: Decreased awareness of safety;Decreased awareness of deficits Awareness: Intellectual Problem Solving: Requires verbal cues General Comments: Pt with increased arousal this session. Following one step commands consistently. Pt able to sustain attention to perform oral care. UE fatiguing and requiring assistance. Benefits from calm environment and cues.        Exercises     Shoulder Instructions       General Comments VSS on vent; pt with elevating RR. Notified RT who was present for parts of session    Pertinent Vitals/ Pain       Pain Assessment: Faces Faces Pain Scale: Hurts little more Pain Location: grimacing with coughing Pain Descriptors / Indicators: Grimacing;Restless Pain Intervention(s): Monitored during session;Limited  activity within patient's tolerance;Repositioned  Home Living                                          Prior Functioning/Environment              Frequency  Min 2X/week        Progress Toward Goals  OT Goals(current goals can now be found in the care plan section)  Progress towards OT goals: Progressing toward goals  Acute Rehab OT Goals Patient Stated Goal: agreeable to session, then to lay back down due to pain OT Goal Formulation: Patient unable to participate in goal setting Time For Goal Achievement: 09/20/20 Potential to Achieve Goals: Good ADL Goals Pt Will Perform Grooming: sitting;bed level;with mod assist Additional ADL Goal #1: Pt will consistently follow 1 step commands ` Additional ADL Goal #2: Pt will tolerate upright position - EOB sitting, recliner, or chair egress x 10 mins with VSS in prep for ADLs  Plan Discharge plan remains appropriate    Co-evaluation    PT/OT/SLP Co-Evaluation/Treatment: Yes Reason for Co-Treatment: For patient/therapist safety;To address functional/ADL transfers   OT goals addressed during session: ADL's and self-care      AM-PAC OT "6 Clicks" Daily Activity  Outcome Measure   Help from another person eating meals?: Total Help from another person taking care of personal grooming?: A Lot Help from another person toileting, which includes using toliet, bedpan, or urinal?: Total Help from another person bathing (including washing, rinsing, drying)?: Total Help from another person to put on and taking off regular upper body clothing?: A Lot Help from another person to put on and taking off regular lower body clothing?: Total 6 Click Score: 8    End of Session Equipment Utilized During Treatment: Oxygen;Back brace;Cervical collar  OT Visit Diagnosis: Muscle weakness (generalized) (M62.81)   Activity Tolerance Patient tolerated treatment well   Patient Left in bed;with call bell/phone within  reach;with bed alarm set;with restraints reapplied   Nurse Communication Mobility status        Time: 1510-1555 OT Time Calculation (min): 45 min  Charges: OT General Charges $OT Visit: 1 Visit OT Treatments $Self Care/Home Management : 23-37 mins  Jiyaan Steinhauser MSOT, OTR/L Acute Rehab Pager: [phone] Office: [phone]   Theodoro [employer] 09/13/2020, 4:55 PM

## 2020-09-14 ENCOUNTER — Inpatient Hospital Stay (HOSPITAL_COMMUNITY): Payer: Medicaid Other

## 2020-09-14 LAB — BASIC METABOLIC PANEL
Anion gap: 8 (ref 5–15)
BUN: 14 mg/dL (ref 6–20)
CO2: 36 mmol/L — ABNORMAL HIGH (ref 22–32)
Calcium: 9.1 mg/dL (ref 8.9–10.3)
Chloride: 93 mmol/L — ABNORMAL LOW (ref 98–111)
Creatinine, Ser: 0.67 mg/dL (ref 0.61–1.24)
GFR, Estimated: >60 mL/min (ref >60)
Glucose, Bld: 117 mg/dL — ABNORMAL HIGH (ref 70–99)
Potassium: 3.6 mmol/L (ref 3.5–5.1)
Sodium: 137 mmol/L (ref 135–145)

## 2020-09-14 LAB — GLUCOSE, CAPILLARY
Glucose-Capillary: 122 mg/dL — ABNORMAL HIGH (ref 70–99)
Glucose-Capillary: 133 mg/dL — ABNORMAL HIGH (ref 70–99)
Glucose-Capillary: 141 mg/dL — ABNORMAL HIGH (ref 70–99)
Glucose-Capillary: 142 mg/dL — ABNORMAL HIGH (ref 70–99)
Glucose-Capillary: 151 mg/dL — ABNORMAL HIGH (ref 70–99)
Glucose-Capillary: 164 mg/dL — ABNORMAL HIGH (ref 70–99)

## 2020-09-14 MED ORDER — UMECLIDINIUM BROMIDE 62.5 MCG/INH IN AEPB
1.0000 | INHALATION_SPRAY | Freq: Every day | RESPIRATORY_TRACT | Status: DC
  Filled 2020-09-14: qty 7

## 2020-09-14 MED ORDER — POTASSIUM CHLORIDE 20 MEQ PO PACK
40.0000 meq | PACK | Freq: Once | ORAL | Status: AC
  Administered 2020-09-14: 40 meq
  Filled 2020-09-14: qty 2

## 2020-09-14 MED ORDER — FUROSEMIDE 10 MG/ML IJ SOLN
40.0000 mg | Freq: Once | INTRAMUSCULAR | Status: AC
  Administered 2020-09-14: 40 mg via INTRAVENOUS
  Filled 2020-09-14: qty 4

## 2020-09-14 NOTE — Progress Notes (Signed)
Patient ID: Charles Weeks, male   DOB: 1965-11-26, 54 y.o.   MRN: [id number] Follow up - Trauma Critical Care  Patient Details:    Charles Weeks is an 55 y.o. male.  Lines/tubes : Chest Tube Lateral;Right (Active)  Status To water seal 09/13/20 0800  Chest Tube Air Leak None 09/13/20 0800  Patency Intervention Tip/tilt 09/13/20 0800  Drainage Description Serous 09/13/20 0800  Dressing Status Clean;Dry;Intact 09/13/20 0800  Dressing Intervention Dressing changed 09/08/20 0000  Site Assessment Clean;Dry;Intact 09/13/20 0800  Surrounding Skin Unable to view 09/13/20 0800  Output (mL) 100 mL 09/14/20 0600     Gastrostomy/Enterostomy Percutaneous endoscopic gastrostomy (PEG) 20 Fr. LUQ (Active)  Surrounding Skin Dry;Intact 09/13/20 2000  Tube Status Patent 09/13/20 2000  Drainage Appearance None 09/13/20 2000  Dressing Status Clean;Dry;Intact 09/13/20 2000  Dressing Intervention Other (Comment) 09/13/20 2000  Dressing Type Split gauze;Abdominal Binder 09/13/20 2000     External Urinary Catheter (Active)  Collection Container Standard drainage bag 09/13/20 2000  Suction (Verified suction is between 40-80 mmHg) Yes 09/13/20 0800  Securement Method Tape 09/13/20 2000  Site Assessment Clean;Intact 09/13/20 0800  Intervention Equipment Changed 09/10/20 2000  Output (mL) 600 mL 09/14/20 0600    Microbiology/Sepsis markers: Results for orders placed or performed during the hospital encounter of 08/17/20  Resp Panel by RT-PCR (Flu A&B, Covid) Nasopharyngeal Swab     Status: None   Collection Time: 08/17/20  3:40 PM   Specimen: Nasopharyngeal Swab; Nasopharyngeal(NP) swabs in vial transport medium  Result Value Ref Range Status   SARS Coronavirus 2 by RT PCR NEGATIVE NEGATIVE Final    Comment: (NOTE) SARS-CoV-2 target nucleic acids are NOT DETECTED.  The SARS-CoV-2 RNA is generally detectable in upper respiratory specimens during the acute phase of infection. The  lowest concentration of SARS-CoV-2 viral copies this assay can detect is 138 copies/mL. A negative result does not preclude SARS-Cov-2 infection and should not be used as the sole basis for treatment or other patient management decisions. A negative result may occur with  improper specimen collection/handling, submission of specimen other than nasopharyngeal swab, presence of viral mutation(s) within the areas targeted by this assay, and inadequate number of viral copies(<138 copies/mL). A negative result must be combined with clinical observations, patient history, and epidemiological information. The expected result is Negative.  Fact Sheet for Patients:  BloggerCourse.com  Fact Sheet for Healthcare Providers:  SeriousBroker.it  This test is no t yet approved or cleared by the [place] FDA and  has been authorized for detection and/or diagnosis of SARS-CoV-2 by FDA under an Emergency Use Authorization (EUA). This EUA will remain  in effect (meaning this test can be used) for the duration of the COVID-19 declaration under Section 564(b)(1) of the Act, 21 U.S.C.section 360bbb-3(b)(1), unless the authorization is terminated  or revoked sooner.       Influenza A by PCR NEGATIVE NEGATIVE Final   Influenza B by PCR NEGATIVE NEGATIVE Final    Comment: (NOTE) The Xpert Xpress SARS-CoV-2/FLU/RSV plus assay is intended as an aid in the diagnosis of influenza from Nasopharyngeal swab specimens and should not be used as a sole basis for treatment. Nasal washings and aspirates are unacceptable for Xpert Xpress SARS-CoV-2/FLU/RSV testing.  Fact Sheet for Patients: BloggerCourse.com  Fact Sheet for Healthcare Providers: SeriousBroker.it  This test is not yet approved or cleared by the [place] FDA and has been authorized for detection and/or diagnosis of SARS-CoV-2 by FDA under  an Emergency Use Authorization (EUA). This  EUA will remain in effect (meaning this test can be used) for the duration of the COVID-19 declaration under Section 564(b)(1) of the Act, 21 U.S.C. section 360bbb-3(b)(1), unless the authorization is terminated or revoked.  Performed at [facility], [address], [place], [place] [address]   Surgical PCR screen     Status: None   Collection Time: 08/19/20  7:38 AM   Specimen: Nasal Mucosa; Nasal Swab  Result Value Ref Range Status   MRSA, PCR NEGATIVE NEGATIVE Final   Staphylococcus aureus NEGATIVE NEGATIVE Final    Comment: (NOTE) The Xpert SA Assay (FDA approved for NASAL specimens in patients 10 years of age and older), is one component of a comprehensive surveillance program. It is not intended to diagnose infection nor to guide or monitor treatment. Performed at [facility], [address] [place], [place] [address]     Anti-infectives:  Anti-infectives (From admission, onward)   Start     Dose/Rate Route Frequency Ordered Stop   08/19/20 0845  cefTRIAXone (ROCEPHIN) 2 g in sodium chloride 0.9 % 100 mL IVPB  Status:  Discontinued        2 g 200 mL/hr over 30 Minutes Intravenous Every 24 hours 08/19/20 0753 08/25/20 1304   08/17/20 1515  ceFAZolin (ANCEF) IVPB 2g/100 mL premix        2 g 200 mL/hr over 30 Minutes Intravenous  Once 08/17/20 1500 08/17/20 1530      Subjective:    Overnight Issues:   Objective:  Vital signs for last 24 hours: Temp:  [98.3 F (36.8 C)-99.5 F (37.5 C)] 98.3 F (36.8 C) (04/29 0400) Pulse Rate:  [86-127] 107 (04/29 0753) Resp:  [14-40] 18 (04/29 0753) BP: (112-141)/(61-114) 134/73 (04/29 0700) SpO2:  [88 %-97 %] 96 % (04/29 0753) FiO2 (%):  [40 %] 40 % (04/29 0753) Weight:  [87.4 kg] 87.4 kg (04/29 0421)  Hemodynamic parameters for last 24 hours:    Intake/Output from previous day: 04/28 0701 - 04/29 0700 In: 2440 [I.V.:120; NG/GT:2320] Out: 5150 [Urine:5050; Chest  Tube:100]  Intake/Output this shift: No intake/output data recorded.  Vent settings for last 24 hours: Vent Mode: PSV;CPAP FiO2 (%):  [40 %] 40 % Set Rate:  [18 bmp] 18 bmp Vt Set:  [480 mL] 480 mL PEEP:  [8 cmH20] 8 cmH20 Pressure Support:  [8 cmH20-12 cmH20] 8 cmH20 Plateau Pressure:  [24 cmH20-35 cmH20] 24 cmH20  Physical Exam:  General: on vent Neuro: F/C HEENT/Neck: trach-clean, intact Resp: clear to auscultation bilaterally CVS: regular rate and rhythm, S1, S2 normal, no murmur, click, rub or gallop GI: soft, nontender, BS WNL, no r/g Extremities: edema 2+  Results for orders placed or performed during the hospital encounter of 08/17/20 (from the past 24 hour(s))  Glucose, capillary     Status: None   Collection Time: 09/13/20 11:37 AM  Result Value Ref Range   Glucose-Capillary 97 70 - 99 mg/dL  Glucose, capillary     Status: Abnormal   Collection Time: 09/13/20  4:36 PM  Result Value Ref Range   Glucose-Capillary 129 (H) 70 - 99 mg/dL  Glucose, capillary     Status: Abnormal   Collection Time: 09/13/20  7:37 PM  Result Value Ref Range   Glucose-Capillary 126 (H) 70 - 99 mg/dL  Glucose, capillary     Status: Abnormal   Collection Time: 09/13/20 11:36 PM  Result Value Ref Range   Glucose-Capillary 139 (H) 70 - 99 mg/dL  Basic  metabolic panel     Status: Abnormal   Collection Time: 09/14/20  2:00 AM  Result Value Ref Range   Sodium 137 135 - 145 mmol/L   Potassium 3.6 3.5 - 5.1 mmol/L   Chloride 93 (L) 98 - 111 mmol/L   CO2 36 (H) 22 - 32 mmol/L   Glucose, Bld 117 (H) 70 - 99 mg/dL   BUN 14 6 - 20 mg/dL   Creatinine, Ser 1.61 0.61 - 1.24 mg/dL   Calcium 9.1 8.9 - 09.6 mg/dL   GFR, Estimated >04 >54 mL/min   Anion gap 8 5 - 15  Glucose, capillary     Status: Abnormal   Collection Time: 09/14/20  4:12 AM  Result Value Ref Range   Glucose-Capillary 122 (H) 70 - 99 mg/dL  Glucose, capillary     Status: Abnormal   Collection Time: 09/14/20  8:03 AM  Result  Value Ref Range   Glucose-Capillary 142 (H) 70 - 99 mg/dL    Assessment & Plan: Present on Admission: . Thoracic spine fracture (HCC)    LOS: 28 days   Additional comments:I reviewed the patient's new clinical lab test results. . Fall from height  R PTX, B effusion- R CT remains until output <100 for 24h - possibly 4/30 Multiple B rib fxs- these are at the junction of the thoracic vertebral bodies, pain control, IS 3 column T8 vertebral body fx-perDr. Jones,non-op now, plan mobilize in brace once off vent. Needs CTO brace for HOB>30 degrees. T7 neural arch fx- per nsgy R TVP T4-8 - pain control, per nsgy R C7 TVP fx through foramen- C collar per Dr. Yetta Barre, CTAnegatvie Large scalp laceration/right eyebrow laceration- repair by Dr. Lovick4/05/2020, sutures removed Right neck abrasion/skin tear- bacitracin BID Facial ecchymosis- CT maxillofacialdemonstrates no facial bone fractures. Some anterior subluxation of the right mandibular condyle HTN- home lopressor 25mg  daily CAD/H/O MI/DES- on ASA at home. Hold for now. 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 Poor weaning. Trach/PEG 4/18by Dr. 03-05-1974. Weaning on 8/8 now. Rash on back- benadryl Hypotension- resolved Depression/anxiety- home bupropion whentaking PO, home buspar BPH- home terazosin, repeatedly failedvoiding trials, increased bethanachol 4/15. I&O q6h ABL anemia- stable FEN-TF,bowel regimen, lasix x1 and replete mild hypokalemia VTE-LMWH Dispo- ICU;  plan home on vent (if needed) Guaifenesin and scop patch for secretions Critical Care Total Time*: 33 Minutes  5/15, MD, MPH, FACS Trauma & General Surgery Use [email or web address] to contact on call provider  09/14/2020  *Care during the described time interval was provided by me. I have reviewed  this patient's available data, including medical history, events of note, physical examination and test results as part of my evaluation.

## 2020-09-14 NOTE — Progress Notes (Signed)
Pt placed on full vent support due to RR in the 40's and agitation. RN aware.

## 2020-09-14 NOTE — Progress Notes (Signed)
Pt placed on PSV 8/8 per wean protocol. Pt is tolerating well at this time. RN aware. Rt to continue to monitor.

## 2020-09-15 LAB — GLUCOSE, CAPILLARY
Glucose-Capillary: 127 mg/dL — ABNORMAL HIGH (ref 70–99)
Glucose-Capillary: 140 mg/dL — ABNORMAL HIGH (ref 70–99)
Glucose-Capillary: 136 mg/dL — ABNORMAL HIGH (ref 70–99)
Glucose-Capillary: 140 mg/dL — ABNORMAL HIGH (ref 70–99)
Glucose-Capillary: 108 mg/dL — ABNORMAL HIGH (ref 70–99)
Glucose-Capillary: 119 mg/dL — ABNORMAL HIGH (ref 70–99)

## 2020-09-15 LAB — BASIC METABOLIC PANEL
Anion gap: 9 (ref 5–15)
BUN: 16 mg/dL (ref 6–20)
CO2: 35 mmol/L — ABNORMAL HIGH (ref 22–32)
Calcium: 9 mg/dL (ref 8.9–10.3)
Chloride: 95 mmol/L — ABNORMAL LOW (ref 98–111)
Creatinine, Ser: 0.72 mg/dL (ref 0.61–1.24)
GFR, Estimated: >60 mL/min (ref >60)
Glucose, Bld: 122 mg/dL — ABNORMAL HIGH (ref 70–99)
Potassium: 3.9 mmol/L (ref 3.5–5.1)
Sodium: 139 mmol/L (ref 135–145)

## 2020-09-15 NOTE — Progress Notes (Signed)
12 Days Post-Op   Subjective/Chief Complaint: Responds to voice, on vent   Objective: Vital signs in last 24 hours: Temp:  [98.8 F (37.1 C)-99.6 F (37.6 C)] 98.8 F (37.1 C) (04/30 1200) Pulse Rate:  [78-114] 78 (04/30 1200) Resp:  [18-32] 18 (04/30 1200) BP: (108-148)/(59-92) 111/78 (04/30 1200) SpO2:  [91 %-100 %] 96 % (04/30 1200) FiO2 (%):  [40 %] 40 % (04/30 1113) Last BM Date: 09/14/20  Intake/Output from previous day: 04/29 0701 - 04/30 0700 In: 1500 [I.V.:120; NG/GT:1380] Out: 2670 [Urine:2500; Chest Tube:170] Intake/Output this shift: Total I/O In: 350 [I.V.:50; NG/GT:300] Out: -    General: on vent Neuro: F/C HEENTtrach-clean, intact Resp: coarse bilaterally CV: RRR GI: soft, nontender Extremities: edema 2+   Lab Results:  No results for input(s): WBC, HGB, HCT, PLT in the last 72 hours. BMET Recent Labs    09/14/20 0200 09/15/20 0335  NA 137 139  K 3.6 3.9  CL 93* 95*  CO2 36* 35*  GLUCOSE 117* 122*  BUN 14 16  CREATININE 0.67 0.72  CALCIUM 9.1 9.0   PT/INR No results for input(s): LABPROT, INR in the last 72 hours. ABG No results for input(s): PHART, HCO3 in the last 72 hours.  Invalid input(s): PCO2, PO2  Studies/Results: DG CHEST PORT 1 VIEW  Result Date: 09/14/2020 CLINICAL DATA:  Chest tube in place.  Tracheostomy present. EXAM: PORTABLE CHEST 1 VIEW COMPARISON:  September 11, 2020. FINDINGS: Central catheter no longer evident on the right. Chest tube position on the right unchanged. Left-sided chest tube noted previously is no longer evident. Tracheostomy catheter tip is 7.9 cm above the carina. No pneumothorax. There is bibasilar atelectasis with small left pleural effusion. No consolidation. Heart is upper normal in size with pulmonary vascularity normal. No adenopathy. No bone lesions. IMPRESSION: Tube positions as described without pneumothorax. Small left pleural effusion with bibasilar atelectasis. Stable cardiac silhouette.  Electronically Signed   By: Bretta Bang III M.D.   On: 09/14/2020 08:46    Anti-infectives: Anti-infectives (From admission, onward)   Start     Dose/Rate Route Frequency Ordered Stop   08/19/20 0845  cefTRIAXone (ROCEPHIN) 2 g in sodium chloride 0.9 % 100 mL IVPB  Status:  Discontinued        2 g 200 mL/hr over 30 Minutes Intravenous Every 24 hours 08/19/20 0753 08/25/20 1304   08/17/20 1515  ceFAZolin (ANCEF) IVPB 2g/100 mL premix        2 g 200 mL/hr over 30 Minutes Intravenous  Once 08/17/20 1500 08/17/20 1530      Assessment/Plan: Fall from height  R PTX, B effusion-R CT remains until output <100 for 24h - 170 yesterday Multiple B rib fxs- these are at the junction of the thoracic vertebral bodies, pain control 3 column T8 vertebral body fx-perDr. Jones,non-op now, plan mobilize in brace once off vent. Needs CTO brace for HOB>30 degrees. T7 neural arch fx- per nsgy R TVP T4-8 - pain control, per nsgy R C7 TVP fx through foramen- C collar per Dr. Yetta Barre, CTAnegatvie Large scalp laceration/right eyebrow laceration- repair by Dr. Lovick4/05/2020, sutures removed Right neck abrasion/skin tear- bacitracin BID Facial ecchymosis- CT maxillofacialdemonstrates no facial bone fractures. Some anterior subluxation of the right mandibular condyle HTN- home lopressor 25mg  daily CAD/H/O MI/DES- on ASA at home. Hold for now. 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 Poor weaning. Trach/PEG 4/18by Dr.  Janee Morn. Weaning Rash on back- benadryl Hypotension- resolved Depression/anxiety- home bupropion whentaking PO, home buspar BPH- home terazosin, repeatedly failedvoiding trials, increased bethanachol 4/15. I&O q6h ABL anemia- stable FEN-TF,bowel regimen VTE-LMWH Dispo- ICU; plan home on vent (if  needed) Guaifenesin and scop patch for secretions Critical Care Total Time*: 32 Minutes  Emelia Loron 09/15/2020

## 2020-09-16 DIAGNOSIS — Z930 Tracheostomy status: Secondary | ICD-10-CM

## 2020-09-16 DIAGNOSIS — Z9189 Other specified personal risk factors, not elsewhere classified: Secondary | ICD-10-CM | POA: Diagnosis not present

## 2020-09-16 DIAGNOSIS — J90 Pleural effusion, not elsewhere classified: Secondary | ICD-10-CM | POA: Diagnosis not present

## 2020-09-16 DIAGNOSIS — Z7189 Other specified counseling: Secondary | ICD-10-CM

## 2020-09-16 DIAGNOSIS — T1490XA Injury, unspecified, initial encounter: Secondary | ICD-10-CM | POA: Diagnosis not present

## 2020-09-16 LAB — GLUCOSE, CAPILLARY
Glucose-Capillary: 128 mg/dL — ABNORMAL HIGH (ref 70–99)
Glucose-Capillary: 149 mg/dL — ABNORMAL HIGH (ref 70–99)
Glucose-Capillary: 151 mg/dL — ABNORMAL HIGH (ref 70–99)
Glucose-Capillary: 128 mg/dL — ABNORMAL HIGH (ref 70–99)
Glucose-Capillary: 128 mg/dL — ABNORMAL HIGH (ref 70–99)
Glucose-Capillary: 142 mg/dL — ABNORMAL HIGH (ref 70–99)

## 2020-09-16 MED ORDER — IPRATROPIUM-ALBUTEROL 0.5-2.5 (3) MG/3ML IN SOLN
3.0000 mL | Freq: Three times a day (TID) | RESPIRATORY_TRACT | Status: DC
  Administered 2020-09-16 – 2020-09-19 (×10): 3 mL via RESPIRATORY_TRACT
  Filled 2020-09-16 (×10): qty 3

## 2020-09-16 NOTE — Progress Notes (Signed)
13 Days Post-Op   Subjective/Chief Complaint: Sedated, no change   Objective: Vital signs in last 24 hours: Temp:  [97.5 F (36.4 C)-99.4 F (37.4 C)] 99.4 F (37.4 C) (05/01 0400) Pulse Rate:  [78-110] 91 (05/01 1000) Resp:  [14-34] 19 (05/01 1000) BP: (108-147)/(62-85) 108/62 (05/01 1000) SpO2:  [92 %-100 %] 94 % (05/01 1000) FiO2 (%):  [30 %-40 %] 30 % (05/01 0826) Last BM Date: 09/15/20  Intake/Output from previous day: 04/30 0701 - 05/01 0700 In: 1650 [I.V.:70; NG/GT:1580] Out: 1555 [Urine:1475; Chest Tube:80] Intake/Output this shift: Total I/O In: 180 [NG/GT:180] Out: -   General:on vent Neuro:F/C HEENTtrach-clean, intact Resp:coarse bilaterally CV: RRR MG:QQPY, nontender Extremities:edema 2+   Lab Results:  No results for input(s): WBC, HGB, HCT, PLT in the last 72 hours. BMET Recent Labs    09/14/20 0200 09/15/20 0335  NA 137 139  K 3.6 3.9  CL 93* 95*  CO2 36* 35*  GLUCOSE 117* 122*  BUN 14 16  CREATININE 0.67 0.72  CALCIUM 9.1 9.0   PT/INR No results for input(s): LABPROT, INR in the last 72 hours. ABG No results for input(s): PHART, HCO3 in the last 72 hours.  Invalid input(s): PCO2, PO2  Studies/Results: No results found.  Anti-infectives: Anti-infectives (From admission, onward)   Start     Dose/Rate Route Frequency Ordered Stop   08/19/20 0845  cefTRIAXone (ROCEPHIN) 2 g in sodium chloride 0.9 % 100 mL IVPB  Status:  Discontinued        2 g 200 mL/hr over 30 Minutes Intravenous Every 24 hours 08/19/20 0753 08/25/20 1304   08/17/20 1515  ceFAZolin (ANCEF) IVPB 2g/100 mL premix        2 g 200 mL/hr over 30 Minutes Intravenous  Once 08/17/20 1500 08/17/20 1530      Assessment/Plan: Fall from height  R PTX, B effusion-remove right chest tube today Multiple B rib fxs- these are at the junction of the thoracic vertebral bodies, pain control 3 column T8 vertebral body fx-perDr. Jones,non-op now, plan mobilize in  brace once off vent. Needs CTO brace for HOB>30 degrees. T7 neural arch fx- per nsgy R TVP T4-8 - pain control, per nsgy R C7 TVP fx through foramen- C collar per Dr. Yetta Barre, CTAnegative Large scalp laceration/right eyebrow laceration- repair by Dr. Lovick4/05/2020, sutures removed Right neck abrasion/skin tear- bacitracin BID Facial ecchymosis- CT maxillofacialdemonstrates no facial bone fractures. Some anterior subluxation of the right mandibular condyle HTN- home lopressor 25mg  daily CAD/H/O MI/DES- on ASA at home. Hold for now. 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 Weaning today. Trach/PEG 4/18by Dr. 03-05-1974. Rash on back- benadryl Hypotension- resolved Depression/anxiety- home bupropion whentaking PO, home buspar BPH- home terazosin, repeatedly failedvoiding trials, increased bethanachol 4/15. I&O q6h ABL anemia- stable FEN-TF,bowel regimen VTE-LMWH Dispo- ICU;plan home on vent (if needed) Guaifenesin and scop patch for secretions Critical Care Total Time*:31Minutes  5/15 09/16/2020

## 2020-09-16 NOTE — Progress Notes (Signed)
   Daily Progress Note   Patient Name: Charles Weeks       Date: 09/16/2020 DOB: 1965/12/21  Age: 55 y.o. MRN#: [id number] Attending Physician: Roslynn Amble, MD Primary Care Physician: No primary care provider on file. Admit Date: 08/17/2020  Reason for Consultation/Follow-up: Establishing goals of care  Subjective:  Chart Reviewed. Updates Received. Patient Assessed.   Remains sedated. C collar brace in place. Trach present. No family at the bedside.   I spoke with daughter, Charles Weeks and provided updates. She verbalized understanding. We reviewed goals of care discussion regarding care including patient's current full code status. Confirms full code status.   Daughter is clear in expressed goals to continue to treat the treatable aggressively with all family members being in mutual agreement. She expressed appreciation of patient's ability to respond on previous days. Family is remaining hopeful patient will continue to show some improvement/stability. We discussed long-term prognosis and the care patient would need. Daughter verbalizes understanding.   Charles Weeks states family are working out details but are hopeful patient will discharge home with ventilator support with his sister, Charles Weeks. Daughters and other family are planning to offer continued support and remain involved in his health. Family is not interested in facility placement due to concerns of the care he would receive.   All questions answered and support provided.   Length of Stay: 30 days  Vital Signs: BP 108/62   Pulse 91   Temp 99.4 F (37.4 C) (Oral)   Resp 19   Ht 5\' 8"  (1.727 m)   Wt 87.4 kg   SpO2 94%   BMI 29.30 kg/m  SpO2: SpO2: 94 % O2 Device: O2 Device: Ventilator O2 Flow Rate: O2 Flow Rate (L/min): 10 L/min  Physical Exam: Sedated, ventilator dependent, ill-appearing RRR Trach, diminished bilaterally edema           Palliative Care Assessment & Plan   Code Status: Full code  Goals of  Care/Recommendations: Family remains hopeful for some improvement/stability. Understands long-term prognosis and care needs. Daughter, expressed family's plans for patient to eventually discharge home with his sister, Charles Weeks with ventilator support. Family will all be assisting in care. Confirms full code/full scope care allowing patient every opportunity to thrive.  Encourage ongoing discussions.  PMT will continue to remotely follow. Please call with urgent needs as family's goals remain clear at this time.   Prognosis: GUARDED   Discharge Planning: To Be Determined  Thank you for allowing the Palliative Medicine Team to assist in the care of this patient.  Time Total:  25 min.   Visit consisted of counseling and education dealing with the complex and emotionally intense issues of symptom management and palliative care in the setting of serious and potentially life-threatening illness.Greater than 50%  of this time was spent counseling and coordinating care related to the above assessment and plan.  Charles Weeks, AGPCNP-BC  Palliative Medicine Team [phone]

## 2020-09-17 LAB — GLUCOSE, CAPILLARY
Glucose-Capillary: 125 mg/dL — ABNORMAL HIGH (ref 70–99)
Glucose-Capillary: 154 mg/dL — ABNORMAL HIGH (ref 70–99)
Glucose-Capillary: 143 mg/dL — ABNORMAL HIGH (ref 70–99)
Glucose-Capillary: 140 mg/dL — ABNORMAL HIGH (ref 70–99)
Glucose-Capillary: 142 mg/dL — ABNORMAL HIGH (ref 70–99)

## 2020-09-17 MED ORDER — DIPHENHYDRAMINE HCL 50 MG/ML IJ SOLN: 50.0000 mg | Freq: Every evening | INTRAMUSCULAR | Status: DC | PRN

## 2020-09-17 NOTE — Progress Notes (Signed)
Physical Therapy Treatment Patient Details Name: Charles Weeks MRN: [id number] DOB: April 06, 1966 Today's Date: 09/17/2020    History of Present Illness 55 y.o. male who presented 4/1 after falling down about 11 stairs and sustained R PTX, anterior subluxation of R mandibular condyle, multiple bil ribs fxs at junction of thoracic vertebral bodies T5-8 on R and T7-8 on L, 3 column T8 vertebral body fx, T7 neural arch fx, R TVP T4-8 fxs, and R C7 TVP fx through foramen. R chest tube inserted 4/1. Pt found to be hypoxic and was intubated 4/2. No evidence of vascular injury to neck, facial fx, acute intracranial injury, lumbar spine fx, R elbow acute abnormality, or pelvic fx identified on imaging. S/p trach and PEG placement 4/18. S/p L chest tube placement 4/19. PMH: HTN, COPD, CAD, s/p MI with DES RCA in Jan 2020 just on ASA, depression/anxiety, and BPH.    PT Comments    The pt continues to make slow but steady progress with therapies. He was able to tolerate sitting EOB with minG to modA at times this session due to continued restlessness in sitting. He was able to achieve sit-stand with modA of 2 and small lateral steps with maxA of 2 this session, but was limited to ~2 feet due to fatigue. The pt required trach on vent this session to maintain VSS, but was able to express good engagement and desire to attempt standing at this time. The pt will continue to benefit from skilled PT to progress functional strength, muscular endurance, activity tolerance, and coordination of movements to perform bed mobility with reduced assist while maintaining precautions. The pt continues to present with behaviors consistent with Ranchos Level 5 - confused/inappropriate/non-agitated as he responds well to simple commands with improving consistency, but demos poor carryover from prior sessions regarding mobility.     Follow Up Recommendations  Supervision/Assistance - 24 hour;SNF     Equipment Recommendations   Wheelchair (measurements PT);Wheelchair cushion (measurements PT);Hospital bed;Other (comment) (lift)    Recommendations for Other Services       Precautions / Restrictions Precautions Precautions: Cervical;Back Precaution Booklet Issued: No Precaution Comments: trach on vent, bilateral wrist restraints, TLSO donned with rolling if HOB > 30 deg, cervical collar at all times, abdominal binder to protect PEG Required Braces or Orthoses: Cervical Brace;Spinal Brace Cervical Brace: Hard collar;At all times Spinal Brace: Thoracolumbosacral orthotic;Applied in supine position Restrictions Weight Bearing Restrictions: No    Mobility  Bed Mobility Overal bed mobility: Needs Assistance Bed Mobility: Rolling;Sidelying to Sit;Sit to Sidelying Rolling: Mod assist Sidelying to sit: Max assist;+2 for physical assistance     Sit to sidelying: Max assist;+2 for physical assistance General bed mobility comments: modA to roll, but maxA of 2 to complete BLE off EOB and elevation of trunk. pt repeatedly putting BLE back into bed. maxA to return to supine, increased cues for precautions    Transfers Overall transfer level: Needs assistance Equipment used: 2 person hand held assist Transfers: Sit to/from Stand Sit to Stand: Mod assist;+2 safety/equipment         General transfer comment: modA to power up to standing with good participation from pt, bilateral blocking of knees. pt only able to achieve x1 due to onset of fatigue  Ambulation/Gait Ambulation/Gait assistance: Max assist;+2 physical assistance Gait Distance (Feet): 2 Feet Assistive device: 2 person hand held assist Gait Pattern/deviations: Step-to pattern;Decreased stride length   Gait velocity interpretation: <1.31 ft/sec, indicative of household ambulator General Gait Details: pt with small lateral steps  with maxA to steady, maintain upright, and to facilitate wt shift for step       Balance Overall balance assessment:  Needs assistance Sitting-balance support: Bilateral upper extremity supported;Feet supported Sitting balance-Leahy Scale: Poor Sitting balance - Comments: Sitting at EOB with Min Guard-Min A. Close Min Guard as pt with tencency to lean forward. No LOB Postural control: Posterior lean (forward lean) Standing balance support: Bilateral upper extremity supported Standing balance-Leahy Scale: Poor Standing balance comment: Reliant on Max A +2                            Cognition Arousal/Alertness: Awake/alert Behavior During Therapy: Flat affect Overall Cognitive Status: Impaired/Different from baseline Area of Impairment: Attention;Following commands;Safety/judgement;Rancho level;Awareness;Memory;Problem solving               Rancho Levels of Cognitive Functioning Rancho [place] Amigos Scales of Cognitive Functioning: Confused/inappropriate/non-agitated   Current Attention Level: Focused Memory: Decreased short-term memory;Decreased recall of precautions Following Commands: Follows one step commands inconsistently;Follows one step commands with increased time Safety/Judgement: Decreased awareness of safety;Decreased awareness of deficits Awareness: Intellectual Problem Solving: Slow processing;Decreased initiation;Requires verbal cues;Requires tactile cues General Comments: Pt agreeable but with decreased arousal initially, more difficulty holding attention to tasks, benefits from calm environment with simple verbal cues      Exercises      General Comments General comments (skin integrity, edema, etc.): VSS through session, RT placed pt back on ventilator support prior to session. Vent settings on 35% FiO2 and PEEP of 5.      Pertinent Vitals/Pain Pain Assessment: Faces Faces Pain Scale: Hurts little more Pain Location: generalized grimacing with sitting EOB, restless Pain Descriptors / Indicators: Grimacing;Restless Pain Intervention(s): Limited activity within  patient's tolerance;Monitored during session;Repositioned           PT Goals (current goals can now be found in the care plan section) Acute Rehab PT Goals Patient Stated Goal: to stand PT Goal Formulation: Patient unable to participate in goal setting Time For Goal Achievement: 09/19/20 Potential to Achieve Goals: Fair Progress towards PT goals: Progressing toward goals    Frequency    Min 3X/week      PT Plan Current plan remains appropriate    Co-evaluation PT/OT/SLP Co-Evaluation/Treatment: Yes Reason for Co-Treatment: For patient/therapist safety;To address functional/ADL transfers PT goals addressed during session: Mobility/safety with mobility;Balance;Strengthening/ROM        AM-PAC PT "6 Clicks" Mobility   Outcome Measure  Help needed turning from your back to your side while in a flat bed without using bedrails?: A Lot Help needed moving from lying on your back to sitting on the side of a flat bed without using bedrails?: Total Help needed moving to and from a bed to a chair (including a wheelchair)?: Total Help needed standing up from a chair using your arms (e.g., wheelchair or bedside chair)?: Total Help needed to walk in hospital room?: Total Help needed climbing 3-5 steps with a railing? : Total 6 Click Score: 7    End of Session Equipment Utilized During Treatment: Gait belt;Back brace;Cervical collar;Oxygen Activity Tolerance: Patient limited by pain Patient left: in bed;with call bell/phone within reach;with bed alarm set;with nursing/sitter in room;with restraints reapplied;with SCD's reapplied Nurse Communication: Mobility status PT Visit Diagnosis: Muscle weakness (generalized) (M62.81);Difficulty in walking, not elsewhere classified (R26.2)     Time: 2353-6144 PT Time Calculation (min) (ACUTE ONLY): 30 min  Charges:  $Therapeutic Activity: 8-22 mins  Rolm Baptise, PT, DPT   Acute Rehabilitation Department Pager #: [phone]   Gaetana Michaelis 09/17/2020, 5:22 PM

## 2020-09-17 NOTE — Progress Notes (Addendum)
  Speech Language Pathology Treatment: Cognitive-Linquistic;Passy Muir Speaking valve  Patient Details Name: Charles Weeks MRN: [id number] DOB: 04/24/1966 Today's Date: 09/17/2020 Time: 9326-7124 SLP Time Calculation (min) (ACUTE ONLY): 27 min  Assessment / Plan / Recommendation Clinical Impression  Improvements today regarding respiratory support and vocalization with PMV while on trach collar. Cough is strong but not sufficient to effectively clear mucous aided by deep suction with RN. No air trapping behind valve and air directed upward for production of low intensity but clear phonation in phrases with limited labial movement. Intelligibility in phrases approximately 25% (improved from prior sessions). Visual and auditory feedback given for strategies to enhance intensity and articulation. Vitals stayed in normal range. Increased work of breathing and some wheezing at end of session. Continue PMV with ST at present.   Rancho V exhibited and pt able to state reason for admission. Stated month after provided min prompt, date and day of week inaccurate with mod cues impacted by decreases sustained attention and pain level.    HPI HPI: Pt is a 55 y.o. male who presented 4/1 after falling down about 11 stairs (sister reported fell over a half wall from second floor) and sustained R PTX, anterior subluxation of R mandibular condyle, multiple bil ribs fxs at junction of thoracic vertebral bodies T5-8 on R and T7-8 on L, 3 column T8 vertebral body fx, T7 neural arch fx, R TVP T4-8 fxs, and R C7 TVP fx through foramen. R chest tube 4/1. Pt found to be hypoxic and was intubated 4/2. Received trach and PEG placement 4/18. S/p L chest tube placement 4/19. PMH: HTN, COPD, CAD, s/p MI with DES RCA in Jan 2020 just on ASA, depression/anxiety, and BPH.      SLP Plan  Continue with current plan of care       Recommendations         Patient may use Passy-Muir Speech Valve: with SLP only PMSV Supervision:  Full         Oral Care Recommendations: Oral care QID Follow up Recommendations: Skilled Nursing facility;24 hour supervision/assistance SLP Visit Diagnosis: Aphonia (R49.1);Cognitive communication deficit (P80.998) Plan: Continue with current plan of care                       [name] 09/17/2020, 10:36 AM  [name].Ed Nurse, children's [phone] Office [phone]

## 2020-09-17 NOTE — Progress Notes (Signed)
Patient ID: Charles Weeks, male   DOB: 29-Aug-1965, 55 y.o.   MRN: [id number] Follow up - Trauma Critical Care  Patient Details:    Charles Weeks is an 55 y.o. male.  Lines/tubes : Gastrostomy/Enterostomy Percutaneous endoscopic gastrostomy (PEG) 20 Fr. LUQ (Active)  Surrounding Skin Dry;Intact 09/16/20 2000  Tube Status Patent 09/16/20 2000  Drainage Appearance None 09/16/20 2000  Dressing Status Clean;Dry;Intact 09/16/20 2000  Dressing Intervention Other (Comment) 09/15/20 0800  Dressing Type Split gauze;Abdominal Binder 09/16/20 2000     External Urinary Catheter (Active)  Collection Container Standard drainage bag 09/16/20 2000  Suction (Verified suction is between 40-80 mmHg) Yes 09/16/20 0800  Securement Method Securing device (Describe) 09/16/20 2000  Site Assessment Clean;Intact 09/16/20 2000  Intervention Equipment Changed 09/16/20 0800  Output (mL) 300 mL 09/17/20 0600    Microbiology/Sepsis markers: Results for orders placed or performed during the hospital encounter of 08/17/20  Resp Panel by RT-PCR (Flu A&B, Covid) Nasopharyngeal Swab     Status: None   Collection Time: 08/17/20  3:40 PM   Specimen: Nasopharyngeal Swab; Nasopharyngeal(NP) swabs in vial transport medium  Result Value Ref Range Status   SARS Coronavirus 2 by RT PCR NEGATIVE NEGATIVE Final    Comment: (NOTE) SARS-CoV-2 target nucleic acids are NOT DETECTED.  The SARS-CoV-2 RNA is generally detectable in upper respiratory specimens during the acute phase of infection. The lowest concentration of SARS-CoV-2 viral copies this assay can detect is 138 copies/mL. A negative result does not preclude SARS-Cov-2 infection and should not be used as the sole basis for treatment or other patient management decisions. A negative result may occur with  improper specimen collection/handling, submission of specimen other than nasopharyngeal swab, presence of viral mutation(s) within the areas targeted by this  assay, and inadequate number of viral copies(<138 copies/mL). A negative result must be combined with clinical observations, patient history, and epidemiological information. The expected result is Negative.  Fact Sheet for Patients:  BloggerCourse.com  Fact Sheet for Healthcare Providers:  SeriousBroker.it  This test is no t yet approved or cleared by the [place] FDA and  has been authorized for detection and/or diagnosis of SARS-CoV-2 by FDA under an Emergency Use Authorization (EUA). This EUA will remain  in effect (meaning this test can be used) for the duration of the COVID-19 declaration under Section 564(b)(1) of the Act, 21 U.S.C.section 360bbb-3(b)(1), unless the authorization is terminated  or revoked sooner.       Influenza A by PCR NEGATIVE NEGATIVE Final   Influenza B by PCR NEGATIVE NEGATIVE Final    Comment: (NOTE) The Xpert Xpress SARS-CoV-2/FLU/RSV plus assay is intended as an aid in the diagnosis of influenza from Nasopharyngeal swab specimens and should not be used as a sole basis for treatment. Nasal washings and aspirates are unacceptable for Xpert Xpress SARS-CoV-2/FLU/RSV testing.  Fact Sheet for Patients: BloggerCourse.com  Fact Sheet for Healthcare Providers: SeriousBroker.it  This test is not yet approved or cleared by the [place] FDA and has been authorized for detection and/or diagnosis of SARS-CoV-2 by FDA under an Emergency Use Authorization (EUA). This EUA will remain in effect (meaning this test can be used) for the duration of the COVID-19 declaration under Section 564(b)(1) of the Act, 21 U.S.C. section 360bbb-3(b)(1), unless the authorization is terminated or revoked.  Performed at [facility], [address] [place], [place] [address]   Surgical PCR screen     Status: None   Collection Time: 08/19/20  7:38 AM  Specimen: Nasal Mucosa; Nasal Swab  Result Value Ref Range Status   MRSA, PCR NEGATIVE NEGATIVE Final   Staphylococcus aureus NEGATIVE NEGATIVE Final    Comment: (NOTE) The Xpert SA Assay (FDA approved for NASAL specimens in patients 63 years of age and older), is one component of a comprehensive surveillance program. It is not intended to diagnose infection nor to guide or monitor treatment. Performed at [facility], [address], [place], [place] [address]     Anti-infectives:  Anti-infectives (From admission, onward)   Start     Dose/Rate Route Frequency Ordered Stop   08/19/20 0845  cefTRIAXone (ROCEPHIN) 2 g in sodium chloride 0.9 % 100 mL IVPB  Status:  Discontinued        2 g 200 mL/hr over 30 Minutes Intravenous Every 24 hours 08/19/20 0753 08/25/20 1304   08/17/20 1515  ceFAZolin (ANCEF) IVPB 2g/100 mL premix        2 g 200 mL/hr over 30 Minutes Intravenous  Once 08/17/20 1500 08/17/20 1530    Consults: Treatment Team:  Tia Alert, MD    Studies:    Events:  Subjective:    Overnight Issues:   Objective:  Vital signs for last 24 hours: Temp:  [98.5 F (36.9 C)-99.7 F (37.6 C)] 98.8 F (37.1 C) (05/02 0700) Pulse Rate:  [85-113] 96 (05/02 0600) Resp:  [16-34] 17 (05/02 0600) BP: (94-135)/(54-82) 110/71 (05/02 0600) SpO2:  [92 %-100 %] 97 % (05/02 0600) FiO2 (%):  [30 %] 30 % (05/02 0409)  Hemodynamic parameters for last 24 hours:    Intake/Output from previous day: 05/01 0701 - 05/02 0700 In: 1380 [NG/GT:1380] Out: 1450 [Urine:1450]  Intake/Output this shift: No intake/output data recorded.  Vent settings for last 24 hours: Vent Mode: PRVC FiO2 (%):  [30 %] 30 % Set Rate:  [18 bmp] 18 bmp Vt Set:  [480 mL] 480 mL PEEP:  [8 cmH20] 8 cmH20 Pressure Support:  [5 cmH20] 5 cmH20 Plateau Pressure:  [15 cmH20-23 cmH20] 17 cmH20  Physical Exam:  General: no respiratory distress Neuro: F/C HEENT/Neck: trach-clean, intact Resp:  clear to auscultation bilaterally CVS: regular rate and rhythm, S1, S2 normal, no murmur, click, rub or gallop GI: soft, PEG OK Extremities: edema 1+  Results for orders placed or performed during the hospital encounter of 08/17/20 (from the past 24 hour(s))  Glucose, capillary     Status: Abnormal   Collection Time: 09/16/20 11:04 AM  Result Value Ref Range   Glucose-Capillary 151 (H) 70 - 99 mg/dL  Glucose, capillary     Status: Abnormal   Collection Time: 09/16/20  3:13 PM  Result Value Ref Range   Glucose-Capillary 128 (H) 70 - 99 mg/dL  Glucose, capillary     Status: Abnormal   Collection Time: 09/16/20  7:23 PM  Result Value Ref Range   Glucose-Capillary 128 (H) 70 - 99 mg/dL  Glucose, capillary     Status: Abnormal   Collection Time: 09/16/20 11:21 PM  Result Value Ref Range   Glucose-Capillary 142 (H) 70 - 99 mg/dL  Glucose, capillary     Status: Abnormal   Collection Time: 09/17/20  3:18 AM  Result Value Ref Range   Glucose-Capillary 125 (H) 70 - 99 mg/dL  Glucose, capillary     Status: Abnormal   Collection Time: 09/17/20  7:52 AM  Result Value Ref Range   Glucose-Capillary 154 (H) 70 - 99 mg/dL    Assessment & Plan: Present on  Admission: . Thoracic spine fracture (HCC)    LOS: 31 days   Additional comments:I reviewed the patient's new clinical lab test results. . Fall from height  R PTX, B effusion-remove right chest tube today Multiple B rib fxs- these are at the junction of the thoracic vertebral bodies, pain control 3 column T8 vertebral body fx-perDr. Jones,non-op now, plan mobilize in brace once off vent. Needs CTO brace for HOB>30 degrees. T7 neural arch fx- per nsgy R TVP T4-8 - pain control, per nsgy R C7 TVP fx through foramen- C collar per Dr. Yetta Barre, CTAnegative Large scalp laceration/right eyebrow laceration- repair by Dr. Lovick4/05/2020, sutures removed Right neck abrasion/skin tear- bacitracin BID Facial ecchymosis- CT  maxillofacialdemonstrates no facial bone fractures. Some anterior subluxation of the right mandibular condyle HTN- home lopressor 25mg  daily CAD/H/O MI/DES- on ASA at home. Hold for now. Monitor, on tele Acute hypercarbic ventilator dependent respiratory failure/COPD-Trach/PEG 4/18by Dr. 03-05-1974. PEEP to 5 today, HTC trial Rash on back- benadryl Hypotension- resolved Depression/anxiety- home bupropion whentaking PO, home buspar BPH- home terazosin, repeatedly failedvoiding trials, increased bethanachol 4/15. I&O q6h ABL anemia- stable FEN-TF,bowel regimen VTE-LMWH Dispo- ICU;plan home on vent (if needed) Guaifenesin and scop patch for secretions Critical Care Total Time*: 34 Minutes  5/15, MD, MPH, FACS Trauma & General Surgery Use [email or web address] to contact on call provider  09/17/2020  *Care during the described time interval was provided by me. I have reviewed this patient's available data, including medical history, events of note, physical examination and test results as part of my evaluation.

## 2020-09-17 NOTE — Progress Notes (Signed)
Occupational Therapy Treatment Patient Details Name: Charles Weeks MRN: [id number] DOB: March 30, 1966 Today's Date: 09/17/2020    History of present illness 55 y.o. male who presented 4/1 after falling down about 11 stairs and sustained R PTX, anterior subluxation of R mandibular condyle, multiple bil ribs fxs at junction of thoracic vertebral bodies T5-8 on R and T7-8 on L, 3 column T8 vertebral body fx, T7 neural arch fx, R TVP T4-8 fxs, and R C7 TVP fx through foramen. R chest tube inserted 4/1. Pt found to be hypoxic and was intubated 4/2. No evidence of vascular injury to neck, facial fx, acute intracranial injury, lumbar spine fx, R elbow acute abnormality, or pelvic fx identified on imaging. S/p trach and PEG placement 4/18. S/p L chest tube placement 4/19. PMH: HTN, COPD, CAD, s/p MI with DES RCA in Jan 2020 just on ASA, depression/anxiety, and BPH.   OT comments  Pt continues to present with decreased cognition, balance, strength, and activity tolerance. Following one step commands this session, but with increased time and cues due to fatigue and decreased arousal. Pt requiring Max A for grooming tasks. Requiring Mod-Max A+2 for bed mobility and Min Guard-Min A for sitting balance at EOB; however, pending cognition needing tactile cues for safety and postural corrections. Pt performing sit<>stand with Mod A +2 and taking side steps towards [facility]; RN present. Pt continues to present at Rachnos level V. Continue to recommend dc to post-acute rehab and will continue to follow acutely as admitted.   VSS through session, RT placed pt back on ventilator support prior to session. Vent settings on 35% FiO2 and PEEP of 5.   Follow Up Recommendations  LTACH;SNF    Equipment Recommendations  None recommended by OT    Recommendations for Other Services      Precautions / Restrictions Precautions Precautions: Cervical;Back Precaution Booklet Issued: No Precaution Comments: trach on vent, bilateral  wrist restraints, TLSO donned with rolling if HOB > 30 deg, cervical collar at all times, abdominal binder to protect PEG Required Braces or Orthoses: Cervical Brace;Spinal Brace Cervical Brace: Hard collar;At all times Spinal Brace: Thoracolumbosacral orthotic;Applied in supine position Restrictions Weight Bearing Restrictions: No       Mobility Bed Mobility Overal bed mobility: Needs Assistance Bed Mobility: Rolling;Sidelying to Sit;Sit to Sidelying Rolling: Mod assist Sidelying to sit: Max assist;+2 for physical assistance     Sit to sidelying: Max assist;+2 for physical assistance General bed mobility comments: modA to roll, but maxA of 2 to complete BLE off EOB and elevation of trunk. pt repeatedly putting BLE back into bed. maxA to return to supine, increased cues for precautions    Transfers Overall transfer level: Needs assistance Equipment used: 2 person hand held assist Transfers: Sit to/from Stand Sit to Stand: Mod assist;+2 safety/equipment         General transfer comment: modA to power up to standing with good participation from pt, bilateral blocking of knees. pt only able to achieve x1 due to onset of fatigue    Balance Overall balance assessment: Needs assistance Sitting-balance support: Bilateral upper extremity supported;Feet supported Sitting balance-Leahy Scale: Poor Sitting balance - Comments: Sitting at EOB with Min Guard-Min A. Close Min Guard as pt with tencency to lean forward. No LOB Postural control: Posterior lean (forward lean) Standing balance support: Bilateral upper extremity supported Standing balance-Leahy Scale: Poor Standing balance comment: Reliant on Max A +2  ADL either performed or assessed with clinical judgement   ADL Overall ADL's : Needs assistance/impaired     Grooming: Wash/dry face;Maximal assistance;Sitting Grooming Details (indicate cue type and reason): Max hand over hand due to  poor attention. Pt initating task but then fatiguing                 Toilet Transfer: Moderate assistance;+2 for physical assistance;+2 for safety/equipment (simulated at EOB) Toilet Transfer Details (indicate cue type and reason): Mod A +2 for power up and gaining balance         Functional mobility during ADLs: Moderate assistance;+2 for safety/equipment;+2 for physical assistance (sit<>stand) General ADL Comments: Pt performing bed mobility, sit<>stand, and grooming. poor cognition, balance, strength, and activity tolerance     Vision       Perception     Praxis      Cognition Arousal/Alertness: Awake/alert Behavior During Therapy: Flat affect Overall Cognitive Status: Impaired/Different from baseline Area of Impairment: Attention;Following commands;Safety/judgement;Rancho level;Awareness;Memory;Problem solving               Rancho Levels of Cognitive Functioning Rancho Los Amigos Scales of Cognitive Functioning: Confused/inappropriate/non-agitated   Current Attention Level: Focused Memory: Decreased short-term memory;Decreased recall of precautions Following Commands: Follows one step commands inconsistently;Follows one step commands with increased time Safety/Judgement: Decreased awareness of safety;Decreased awareness of deficits Awareness: Intellectual Problem Solving: Slow processing;Decreased initiation;Requires verbal cues;Requires tactile cues General Comments: Pt agreeable but with decreased arousal initially, more difficulty holding attention to tasks, benefits from calm environment with simple verbal cues        Exercises     Shoulder Instructions       General Comments VSS through session, RT placed pt back on ventilator support prior to session. Vent settings on 35% FiO2 and PEEP of 5.    Pertinent Vitals/ Pain       Pain Assessment: Faces Faces Pain Scale: Hurts little more Pain Location: generalized grimacing with sitting EOB,  restless Pain Descriptors / Indicators: Grimacing;Restless Pain Intervention(s): Monitored during session;Limited activity within patient's tolerance;Repositioned  Home Living                                          Prior Functioning/Environment              Frequency  Min 2X/week        Progress Toward Goals  OT Goals(current goals can now be found in the care plan section)  Progress towards OT goals: Progressing toward goals  Acute Rehab OT Goals Patient Stated Goal: to stand OT Goal Formulation: Patient unable to participate in goal setting Time For Goal Achievement: 09/20/20 Potential to Achieve Goals: Good ADL Goals Pt Will Perform Grooming: sitting;bed level;with mod assist Additional ADL Goal #1: Pt will consistently follow 1 step commands ` Additional ADL Goal #2: Pt will tolerate upright position - EOB sitting, recliner, or chair egress x 10 mins with VSS in prep for ADLs  Plan Discharge plan remains appropriate    Co-evaluation    PT/OT/SLP Co-Evaluation/Treatment: Yes Reason for Co-Treatment: To address functional/ADL transfers;For patient/therapist safety;Complexity of the patient's impairments (multi-system involvement) PT goals addressed during session: Mobility/safety with mobility;Balance;Strengthening/ROM OT goals addressed during session: ADL's and self-care      AM-PAC OT "6 Clicks" Daily Activity     Outcome Measure   Help from another person eating meals?: Total Help from  another person taking care of personal grooming?: A Lot Help from another person toileting, which includes using toliet, bedpan, or urinal?: Total Help from another person bathing (including washing, rinsing, drying)?: Total Help from another person to put on and taking off regular upper body clothing?: A Lot Help from another person to put on and taking off regular lower body clothing?: Total 6 Click Score: 8    End of Session Equipment Utilized  During Treatment: Oxygen;Back brace;Cervical collar  OT Visit Diagnosis: Muscle weakness (generalized) (M62.81)   Activity Tolerance Patient tolerated treatment well   Patient Left in bed;with call bell/phone within reach;with bed alarm set;with restraints reapplied;with SCD's reapplied   Nurse Communication Mobility status        Time: 1539-1610 OT Time Calculation (min): 31 min  Charges: OT General Charges $OT Visit: 1 Visit OT Treatments $Self Care/Home Management : 8-22 mins  Charles Weeks MSOT, OTR/L Acute Rehab Pager: [phone] Office: [phone]   Charles Weeks 09/17/2020, 5:33 PM

## 2020-09-17 NOTE — Progress Notes (Signed)
Pt placed back on full support vent settings due to WOB.

## 2020-09-18 LAB — BASIC METABOLIC PANEL
Anion gap: 8 (ref 5–15)
BUN: 20 mg/dL (ref 6–20)
CO2: 32 mmol/L (ref 22–32)
Calcium: 9.1 mg/dL (ref 8.9–10.3)
Chloride: 97 mmol/L — ABNORMAL LOW (ref 98–111)
Creatinine, Ser: 0.59 mg/dL — ABNORMAL LOW (ref 0.61–1.24)
GFR, Estimated: >60 mL/min (ref >60)
Glucose, Bld: 122 mg/dL — ABNORMAL HIGH (ref 70–99)
Potassium: 4.1 mmol/L (ref 3.5–5.1)
Sodium: 137 mmol/L (ref 135–145)

## 2020-09-18 LAB — GLUCOSE, CAPILLARY
Glucose-Capillary: 110 mg/dL — ABNORMAL HIGH (ref 70–99)
Glucose-Capillary: 122 mg/dL — ABNORMAL HIGH (ref 70–99)
Glucose-Capillary: 127 mg/dL — ABNORMAL HIGH (ref 70–99)
Glucose-Capillary: 137 mg/dL — ABNORMAL HIGH (ref 70–99)
Glucose-Capillary: 154 mg/dL — ABNORMAL HIGH (ref 70–99)
Glucose-Capillary: 154 mg/dL — ABNORMAL HIGH (ref 70–99)
Glucose-Capillary: 158 mg/dL — ABNORMAL HIGH (ref 70–99)

## 2020-09-18 NOTE — Progress Notes (Signed)
Nutrition Follow-up  DOCUMENTATION CODES:   Not applicable  INTERVENTION:   Tube Feeding via PEG:  Pivot 1.5 at 60 ml/hr Provides 2160 kcals, 135 g of protein, 1092 mL of free water  200 ml free water every 4 hours Total free water: 2292 ml   NUTRITION DIAGNOSIS:   Increased nutrient needs related to  (trauma) as evidenced by estimated needs. Ongoing.   GOAL:   Patient will meet greater than or equal to 90% of their needs Met with TF.   MONITOR:   Vent status,TF tolerance  REASON FOR ASSESSMENT:   Ventilator,Consult Enteral/tube feeding initiation and management  ASSESSMENT:   Pt with PMH of COPD followed by Dr Melvyn Novas, HTN, CAD s/p stent who was admitted after fall over 1/2 wall from the second story landing with multiple rib fxs, R pneumothorax, large scalp laceration, and T8 vertebral body fxs, T7, T4-8, C7 fxs.  Pt discussed during ICU rounds and with RN.  Pt attempting weaning trials and working with SLP on PMV trials. Pt working with PT/OT to sit EOB.  Question accuracy of admission 4/1 weight which was 189 lb. Next wt 4/9 was 214 lb. Current weight 188 lb. Edema had decreased.   4/18 s/p trach and PEG  Patient is currently intubated on ventilator support MV: 11.4 L/min Temp (24hrs), Avg:99.1 F (37.3 C), Min:98.8 F (37.1 C), Max:99.4 F (37.4 C)   Medications reviewed and include: colace, folic acid, SSI, MVI with minerals, miralax, senokot, thiamine  Labs reviewed:  CBG's: 110-158  33F PEG  UOP: 1425 ml  L CT: removed 4/26 R CT: removed 4/30 I&O: -6.9 L  Diet Order:   Diet Order            Diet NPO time specified  Diet effective midnight                 EDUCATION NEEDS:   No education needs have been identified at this time  Skin:  Skin Assessment: Skin Integrity Issues: Skin Integrity Issues:: DTI DTI: vertebral column Stage II: penis  Last BM:  5/3; type 5/6 medium  Height:   Ht Readings from Last 1 Encounters:  08/17/20 5'  8" (1.727 m)    Weight:   Wt Readings from Last 1 Encounters:  09/18/20 85.3 kg    Ideal Body Weight:     BMI:  Body mass index is 28.59 kg/m.  Estimated Nutritional Needs:   Kcal:  2000-2200  Protein:  120-135 grams  Fluid:  >2 L/day  Lockie Pares., RD, LDN, CNSC See AMiON for contact information

## 2020-09-18 NOTE — Progress Notes (Signed)
Patient ID: Charles Weeks, male   DOB: 1966/03/21, 55 y.o.   MRN: [id number] Follow up - Trauma Critical Care  Patient Details:    Charles Weeks is an 55 y.o. male.  Lines/tubes : Gastrostomy/Enterostomy Percutaneous endoscopic gastrostomy (PEG) 20 Fr. LUQ (Active)  Surrounding Skin Dry;Intact 09/18/20 0742  Tube Status Patent 09/18/20 0742  Drainage Appearance None 09/18/20 0742  Dressing Status Clean;Dry;Intact 09/18/20 0742  Dressing Intervention Other (Comment) 09/15/20 0800  Dressing Type Abdominal Binder 09/18/20 0742  G Port Intake (mL) 15 ml 09/18/20 0437     External Urinary Catheter (Active)  Collection Container Standard drainage bag 09/18/20 0742  Suction (Verified suction is between 40-80 mmHg) Yes 09/17/20 0800  Securement Method Tape 09/18/20 0742  Site Assessment Clean;Intact 09/18/20 0742  Intervention Equipment Changed 09/18/20 0400  Output (mL) 0 mL 09/18/20 0600    Microbiology/Sepsis markers: Results for orders placed or performed during the hospital encounter of 08/17/20  Resp Panel by RT-PCR (Flu A&B, Covid) Nasopharyngeal Swab     Status: None   Collection Time: 08/17/20  3:40 PM   Specimen: Nasopharyngeal Swab; Nasopharyngeal(NP) swabs in vial transport medium  Result Value Ref Range Status   SARS Coronavirus 2 by RT PCR NEGATIVE NEGATIVE Final    Comment: (NOTE) SARS-CoV-2 target nucleic acids are NOT DETECTED.  The SARS-CoV-2 RNA is generally detectable in upper respiratory specimens during the acute phase of infection. The lowest concentration of SARS-CoV-2 viral copies this assay can detect is 138 copies/mL. A negative result does not preclude SARS-Cov-2 infection and should not be used as the sole basis for treatment or other patient management decisions. A negative result may occur with  improper specimen collection/handling, submission of specimen other than nasopharyngeal swab, presence of viral mutation(s) within the areas targeted by  this assay, and inadequate number of viral copies(<138 copies/mL). A negative result must be combined with clinical observations, patient history, and epidemiological information. The expected result is Negative.  Fact Sheet for Patients:  BloggerCourse.com  Fact Sheet for Healthcare Providers:  SeriousBroker.it  This test is no t yet approved or cleared by the [place] FDA and  has been authorized for detection and/or diagnosis of SARS-CoV-2 by FDA under an Emergency Use Authorization (EUA). This EUA will remain  in effect (meaning this test can be used) for the duration of the COVID-19 declaration under Section 564(b)(1) of the Act, 21 U.S.C.section 360bbb-3(b)(1), unless the authorization is terminated  or revoked sooner.       Influenza A by PCR NEGATIVE NEGATIVE Final   Influenza B by PCR NEGATIVE NEGATIVE Final    Comment: (NOTE) The Xpert Xpress SARS-CoV-2/FLU/RSV plus assay is intended as an aid in the diagnosis of influenza from Nasopharyngeal swab specimens and should not be used as a sole basis for treatment. Nasal washings and aspirates are unacceptable for Xpert Xpress SARS-CoV-2/FLU/RSV testing.  Fact Sheet for Patients: BloggerCourse.com  Fact Sheet for Healthcare Providers: SeriousBroker.it  This test is not yet approved or cleared by the [place] FDA and has been authorized for detection and/or diagnosis of SARS-CoV-2 by FDA under an Emergency Use Authorization (EUA). This EUA will remain in effect (meaning this test can be used) for the duration of the COVID-19 declaration under Section 564(b)(1) of the Act, 21 U.S.C. section 360bbb-3(b)(1), unless the authorization is terminated or revoked.  Performed at [facility], [address] [place], [place] [address]   Surgical PCR screen     Status: None  Collection Time: 08/19/20  7:38 AM    Specimen: Nasal Mucosa; Nasal Swab  Result Value Ref Range Status   MRSA, PCR NEGATIVE NEGATIVE Final   Staphylococcus aureus NEGATIVE NEGATIVE Final    Comment: (NOTE) The Xpert SA Assay (FDA approved for NASAL specimens in patients 65 years of age and older), is one component of a comprehensive surveillance program. It is not intended to diagnose infection nor to guide or monitor treatment. Performed at [facility], [address] [place], [place] [address]     Anti-infectives:  Anti-infectives (From admission, onward)   Start     Dose/Rate Route Frequency Ordered Stop   08/19/20 0845  cefTRIAXone (ROCEPHIN) 2 g in sodium chloride 0.9 % 100 mL IVPB  Status:  Discontinued        2 g 200 mL/hr over 30 Minutes Intravenous Every 24 hours 08/19/20 0753 08/25/20 1304   08/17/20 1515  ceFAZolin (ANCEF) IVPB 2g/100 mL premix        2 g 200 mL/hr over 30 Minutes Intravenous  Once 08/17/20 1500 08/17/20 1530      Subjective:    Overnight Issues:   Objective:  Vital signs for last 24 hours: Temp:  [98.8 F (37.1 C)-99.4 F (37.4 C)] 99 F (37.2 C) (05/03 0800) Pulse Rate:  [84-117] 92 (05/03 1116) Resp:  [12-29] 14 (05/03 1116) BP: (104-150)/(45-113) 111/68 (05/03 1116) SpO2:  [89 %-100 %] 94 % (05/03 1117) FiO2 (%):  [30 %-50 %] 40 % (05/03 1117) Weight:  [85.3 kg] 85.3 kg (05/03 0500)  Hemodynamic parameters for last 24 hours:    Intake/Output from previous day: 05/02 0701 - 05/03 0700 In: 2235 [NG/GT:2040] Out: 1425 [Urine:1425]  Intake/Output this shift: Total I/O In: 200 [NG/GT:200] Out: -   Vent settings for last 24 hours: Vent Mode: PSV;CPAP FiO2 (%):  [30 %-50 %] 40 % Set Rate:  [18 bmp] 18 bmp Vt Set:  [480 mL] 480 mL PEEP:  [5 cmH20] 5 cmH20 Pressure Support:  [10 cmH20-12 cmH20] 10 cmH20 Plateau Pressure:  [20 cmH20-27 cmH20] 20 cmH20  Physical Exam:  General: on vent Neuro: F/C HEENT/Neck: trach-clean, intact Resp: clear to auscultation  bilaterally CVS: regular rate and rhythm, S1, S2 normal, no murmur, click, rub or gallop GI: soft, nontender, BS WNL, no r/g and PEG Extremities: edema 1+  Results for orders placed or performed during the hospital encounter of 08/17/20 (from the past 24 hour(s))  Glucose, capillary     Status: Abnormal   Collection Time: 09/17/20 11:43 AM  Result Value Ref Range   Glucose-Capillary 143 (H) 70 - 99 mg/dL  Glucose, capillary     Status: Abnormal   Collection Time: 09/17/20  4:30 PM  Result Value Ref Range   Glucose-Capillary 140 (H) 70 - 99 mg/dL  Glucose, capillary     Status: Abnormal   Collection Time: 09/17/20  7:52 PM  Result Value Ref Range   Glucose-Capillary 142 (H) 70 - 99 mg/dL  Glucose, capillary     Status: Abnormal   Collection Time: 09/17/20 11:55 PM  Result Value Ref Range   Glucose-Capillary 158 (H) 70 - 99 mg/dL  Basic metabolic panel     Status: Abnormal   Collection Time: 09/18/20  1:12 AM  Result Value Ref Range   Sodium 137 135 - 145 mmol/L   Potassium 4.1 3.5 - 5.1 mmol/L   Chloride 97 (L) 98 - 111 mmol/L   CO2 32 22 - 32 mmol/L  Glucose, Bld 122 (H) 70 - 99 mg/dL   BUN 20 6 - 20 mg/dL   Creatinine, Ser 1.61 (L) 0.61 - 1.24 mg/dL   Calcium 9.1 8.9 - 09.6 mg/dL   GFR, Estimated >04 >54 mL/min   Anion gap 8 5 - 15  Glucose, capillary     Status: Abnormal   Collection Time: 09/18/20  3:50 AM  Result Value Ref Range   Glucose-Capillary 110 (H) 70 - 99 mg/dL  Glucose, capillary     Status: Abnormal   Collection Time: 09/18/20  7:57 AM  Result Value Ref Range   Glucose-Capillary 122 (H) 70 - 99 mg/dL    Assessment & Plan: Present on Admission: . Thoracic spine fracture (HCC)    LOS: 32 days   Additional comments:I reviewed the patient's new clinical lab test results. . Fall from height  R PTX, B effusion Multiple B rib fxs- these are at the junction of the thoracic vertebral bodies, pain control 3 column T8 vertebral body fx-perDr.  Jones,non-op now, plan mobilize in brace once off vent. Needs CTO brace for HOB>30 degrees. T7 neural arch fx- per nsgy R TVP T4-8 - pain control, per nsgy R C7 TVP fx through foramen- C collar per Dr. Yetta Barre, CTAnegative Large scalp laceration/right eyebrow laceration- repair by Dr. Lovick4/05/2020, sutures removed Right neck abrasion/skin tear- bacitracin BID Facial ecchymosis- CT maxillofacialdemonstrates no facial bone fractures. Some anterior subluxation of the right mandibular condyle HTN- home lopressor 25mg  daily CAD/H/O MI/DES- on ASA at home. Hold for now. Monitor, on tele Acute hypercarbic ventilator dependent respiratory failure/COPD-Trach/PEG 4/18by Dr. 03-05-1974. PEEP to 5 today, HTC trial Rash on back- benadryl Hypotension- resolved Depression/anxiety- home bupropion whentaking PO, home buspar BPH- home terazosin, repeatedly failedvoiding trials, increased bethanachol 4/15. I&O q6h ABL anemia- stable FEN-TF,bowel regimen VTE-LMWH Dispo- ICU;plan home on vent (if needed) Guaifenesin and scop patch for secretions Critical Care Total Time*: 40 Minutes  5/15, MD, MPH, FACS Trauma & General Surgery Use [email or web address] to contact on call provider  09/18/2020  *Care during the described time interval was provided by me. I have reviewed this patient's available data, including medical history, events of note, physical examination and test results as part of my evaluation.

## 2020-09-18 NOTE — Progress Notes (Signed)
RT NOTE: patient placed on CPAP/PSV of 12/5 at 0808.  Currently tolerating well.  Will continue to monitor.

## 2020-09-19 LAB — GLUCOSE, CAPILLARY
Glucose-Capillary: 108 mg/dL — ABNORMAL HIGH (ref 70–99)
Glucose-Capillary: 131 mg/dL — ABNORMAL HIGH (ref 70–99)
Glucose-Capillary: 131 mg/dL — ABNORMAL HIGH (ref 70–99)
Glucose-Capillary: 141 mg/dL — ABNORMAL HIGH (ref 70–99)
Glucose-Capillary: 142 mg/dL — ABNORMAL HIGH (ref 70–99)
Glucose-Capillary: 144 mg/dL — ABNORMAL HIGH (ref 70–99)

## 2020-09-19 LAB — MRSA PCR SCREENING: MRSA by PCR: NEGATIVE

## 2020-09-19 MED ORDER — QUETIAPINE FUMARATE 100 MG PO TABS
100.0000 mg | ORAL_TABLET | Freq: Three times a day (TID) | ORAL | Status: DC
  Administered 2020-09-19 – 2020-09-23 (×14): 100 mg
  Filled 2020-09-19 (×14): qty 1

## 2020-09-19 MED ORDER — IPRATROPIUM-ALBUTEROL 0.5-2.5 (3) MG/3ML IN SOLN
3.0000 mL | Freq: Two times a day (BID) | RESPIRATORY_TRACT | Status: DC
  Administered 2020-09-19 – 2020-10-09 (×39): 3 mL via RESPIRATORY_TRACT
  Filled 2020-09-19 (×38): qty 3

## 2020-09-19 MED ORDER — CLONAZEPAM 0.5 MG PO TABS
0.5000 mg | ORAL_TABLET | Freq: Every day | ORAL | Status: DC
  Administered 2020-09-20 – 2020-10-08 (×19): 0.5 mg
  Filled 2020-09-19 (×19): qty 1

## 2020-09-19 NOTE — Progress Notes (Signed)
Occupational Therapy Treatment Patient Details Name: Charles Weeks MRN: [id number] DOB: Apr 25, 1966 Today's Date: 09/19/2020    History of present illness 55 y.o. male who presented 4/1 after falling down about 11 stairs and sustained R PTX, anterior subluxation of R mandibular condyle, multiple bil ribs fxs at junction of thoracic vertebral bodies T5-8 on R and T7-8 on L, 3 column T8 vertebral body fx, T7 neural arch fx, R TVP T4-8 fxs, and R C7 TVP fx through foramen. R chest tube inserted 4/1. Pt found to be hypoxic and was intubated 4/2. No evidence of vascular injury to neck, facial fx, acute intracranial injury, lumbar spine fx, R elbow acute abnormality, or pelvic fx identified on imaging. S/p trach and PEG placement 4/18. S/p L chest tube placement 4/19. PMH: HTN, COPD, CAD, s/p MI with DES RCA in Jan 2020 just on ASA, depression/anxiety, and BPH.   OT comments  Pt on cpap vent on arrival and tolerates EOB sitting and progressed to static standing EOB. Pt with hygiene performed on posterior aspect of head and neck this session with new trach band replaced due to monitor. Pt needs close monitoring of skin as it is red and moist this session. Recommendations for frequent skin checks. Pt tolerating chair position of bed at end of session engaged in interactions with sister (vicky)  Follow Up Recommendations  LTACH;SNF    Equipment Recommendations  Wheelchair (measurements OT);Wheelchair cushion (measurements OT);Hospital bed    Recommendations for Other Services      Precautions / Restrictions Precautions Precautions: Cervical;Back Precaution Booklet Issued: No Precaution Comments: trach on vent, bilateral wrist restraints, TLSO donned with rolling if HOB > 30 deg, cervical collar at all times, abdominal binder to protect PEG Required Braces or Orthoses: Cervical Brace;Spinal Brace Cervical Brace: Hard collar;At all times Spinal Brace: Thoracolumbosacral orthotic;Applied in supine  position       Mobility Bed Mobility Overal bed mobility: Needs Assistance Bed Mobility: Rolling;Sidelying to Sit;Sit to Sidelying Rolling: Mod assist Sidelying to sit: Max assist;+2 for physical assistance     Sit to sidelying: Max assist;+2 for physical assistance General bed mobility comments: modA to roll, but maxA of 2 to manage bil legs off EOB and trunk for supine <> sit R EOB and management of lines. Cues provided to lean onto elbow to control trunk with descent to supine.    Transfers Overall transfer level: Needs assistance Equipment used: 2 person hand held assist Transfers: Sit to/from Stand Sit to Stand: +2 safety/equipment;Min assist;+2 physical assistance         General transfer comment: Bil HHA with therapist on either side of pt, cuing pt to push up to stand, minAx2 to power up and steady.    Balance Overall balance assessment: Needs assistance Sitting-balance support: Bilateral upper extremity supported;Feet supported Sitting balance-Leahy Scale: Poor Sitting balance - Comments: Sitting at EOB with Min A to prevent excessive anterior lean or R lateral lean, repeated cues for flat feet placement as pt tends to cross ankles. Cues to keep R hand on EOB as it tends to slip anteriorly with fatigue. Postural control: Other (comment);Right lateral lean (anterior lean) Standing balance support: Bilateral upper extremity supported;During functional activity Standing balance-Leahy Scale: Poor Standing balance comment: Reliant on bil UE support and minAx2                           ADL either performed or assessed with clinical judgement   ADL Overall ADL's :  Needs assistance/impaired     Grooming: Wash/dry hands;Maximal assistance;Bed level Grooming Details (indicate cue type and reason): hand over hand supine to wash face.                               General ADL Comments: pt passing gas throughout session but denies need to sit on 3n1.  Pt with ccollar doff for skin hygiene in static sitting in the anterior portion of brace so fully supported. pt noted to have one area in center of head with pressue present, skin very wet/ redness due to moisture. the trach collar was removed and replaced with dry band. skin was washed and dried prior to placed back in brace. pt noted to ahve poor fit to TLSO brace and unable to pull tight due to poor fit. Ortho tech called to add extender to brace to attempt to increase effectiveness of brace     Vision       Perception     Praxis      Cognition Arousal/Alertness: Awake/alert Behavior During Therapy: Flat affect Overall Cognitive Status: Impaired/Different from baseline Area of Impairment: Attention;Following commands;Safety/judgement;Rancho level;Awareness;Memory;Problem solving               Rancho Levels of Cognitive Functioning Rancho Los Amigos Scales of Cognitive Functioning: Confused/inappropriate/non-agitated   Current Attention Level: Focused Memory: Decreased short-term memory;Decreased recall of precautions Following Commands: Follows one step commands inconsistently;Follows one step commands with increased time Safety/Judgement: Decreased awareness of safety;Decreased awareness of deficits Awareness: Intellectual Problem Solving: Slow processing;Decreased initiation;Requires verbal cues;Requires tactile cues;Difficulty sequencing General Comments: Pt needing extra time to process and respond to simple multi-modal cues, benefits from his name being stated prior to cue. Pt with flat affect throughout. Pt needing cues to correct posture and hand or feet placement to maintain balance and safety. Difficulty sequencing steps at EOB. pt using head nods instead of mouthing yes        Exercises     Shoulder Instructions       General Comments HR 110-120 Vent CPAP RN deep suction x2 during session due to production    Pertinent Vitals/ Pain       Pain Assessment:  Faces Faces Pain Scale: Hurts even more Pain Location: generalized grimacing with sitting EOB or when getting deep suction by RN Pain Descriptors / Indicators: Grimacing Pain Intervention(s): Monitored during session;Repositioned  Home Living                                          Prior Functioning/Environment              Frequency  Min 2X/week        Progress Toward Goals  OT Goals(current goals can now be found in the care plan section)  Progress towards OT goals: Progressing toward goals  Acute Rehab OT Goals Patient Stated Goal: none stated OT Goal Formulation: Patient unable to participate in goal setting Time For Goal Achievement: 10/03/20 Potential to Achieve Goals: Good ADL Goals Pt Will Perform Grooming: with mod assist;sitting Additional ADL Goal #1: pt will consistently follow 1 step commands Additional ADL Goal #2: pt will demonstrate static sitting min (A) as precursor to adls  Plan Discharge plan remains appropriate    Co-evaluation    PT/OT/SLP Co-Evaluation/Treatment: Yes Reason for Co-Treatment: Complexity of the patient's  impairments (multi-system involvement);Necessary to address cognition/behavior during functional activity;For patient/therapist safety;To address functional/ADL transfers   OT goals addressed during session: ADL's and self-care;Proper use of Adaptive equipment and DME;Strengthening/ROM      AM-PAC OT "6 Clicks" Daily Activity     Outcome Measure   Help from another person eating meals?: Total Help from another person taking care of personal grooming?: Total Help from another person toileting, which includes using toliet, bedpan, or urinal?: Total Help from another person bathing (including washing, rinsing, drying)?: Total Help from another person to put on and taking off regular upper body clothing?: Total Help from another person to put on and taking off regular lower body clothing?: Total 6 Click  Score: 6    End of Session Equipment Utilized During Treatment: Oxygen;Back brace;Cervical collar  OT Visit Diagnosis: Muscle weakness (generalized) (M62.81)   Activity Tolerance Patient tolerated treatment well   Patient Left in bed;with call bell/phone within reach;with bed alarm set;with restraints reapplied;with SCD's reapplied   Nurse Communication Mobility status        Time: 6861-6837 OT Time Calculation (min): 34 min  Charges: OT General Charges $OT Visit: 1 Visit OT Treatments $Self Care/Home Management : 8-22 mins   Brynn, OTR/L  Acute Rehabilitation Services Pager: [phone] Office: [phone] .    Mateo Flow 09/19/2020, 3:12 PM

## 2020-09-19 NOTE — Progress Notes (Signed)
Assisted tele visit to patient with family member.  Kyley Laurel, Carolynne Edouard, RN

## 2020-09-19 NOTE — Progress Notes (Signed)
Physical Therapy Treatment Patient Details Name: Charles Weeks MRN: [id number] DOB: 1966/04/30 Today's Date: 09/19/2020    History of Present Illness 55 y.o. male who presented 4/1 after falling down about 11 stairs and sustained R PTX, anterior subluxation of R mandibular condyle, multiple bil ribs fxs at junction of thoracic vertebral bodies T5-8 on R and T7-8 on L, 3 column T8 vertebral body fx, T7 neural arch fx, R TVP T4-8 fxs, and R C7 TVP fx through foramen. R chest tube inserted 4/1. Pt found to be hypoxic and was intubated 4/2. No evidence of vascular injury to neck, facial fx, acute intracranial injury, lumbar spine fx, R elbow acute abnormality, or pelvic fx identified on imaging. S/p trach and PEG placement 4/18. S/p L chest tube placement 4/19. PMH: HTN, COPD, CAD, s/p MI with DES RCA in Jan 2020 just on ASA, depression/anxiety, and BPH.    PT Comments    Pt is making steady progress towards his goals, demonstrating increased independence of only needing minAx2 for transfers and several side steps towards the R at EOB with bil HHA this date. However, pt demonstrates imbalance, weakness, decreased activity tolerance, and impulsiveness that places him at risk for falls as he quickly fatigued after taking several side steps and spontaneously sat down on the bed. Pt following simple multi-modal commands inconsistently but motivated to participate in session. Will continue to follow acutely. Current recommendations remain appropriate.    Follow Up Recommendations  Supervision/Assistance - 24 hour;SNF     Equipment Recommendations  Wheelchair (measurements PT);Wheelchair cushion (measurements PT);Hospital bed;Rolling walker with 5" wheels;3in1 (PT)    Recommendations for Other Services       Precautions / Restrictions Precautions Precautions: Cervical;Back Precaution Booklet Issued: No Precaution Comments: trach on vent, bilateral wrist restraints, TLSO donned with rolling if HOB >  30 deg, cervical collar at all times, abdominal binder to protect PEG Required Braces or Orthoses: Cervical Brace;Spinal Brace Cervical Brace: Hard collar;At all times Spinal Brace: Thoracolumbosacral orthotic;Applied in supine position Restrictions Weight Bearing Restrictions: No    Mobility  Bed Mobility Overal bed mobility: Needs Assistance Bed Mobility: Rolling;Sidelying to Sit;Sit to Sidelying Rolling: Mod assist Sidelying to sit: Max assist;+2 for physical assistance     Sit to sidelying: Max assist;+2 for physical assistance General bed mobility comments: modA to roll, but maxA of 2 to manage bil legs off EOB and trunk for supine <> sit R EOB and management of lines. Cues provided to lean onto elbow to control trunk with descent to supine.    Transfers Overall transfer level: Needs assistance Equipment used: 2 person hand held assist Transfers: Sit to/from Stand Sit to Stand: +2 safety/equipment;Min assist;+2 physical assistance         General transfer comment: Bil HHA with therapist on either side of pt, cuing pt to push up to stand, minAx2 to power up and steady.  Ambulation/Gait Ambulation/Gait assistance: +2 physical assistance;Min assist;+2 safety/equipment Gait Distance (Feet): 2 Feet Assistive device: 2 person hand held assist Gait Pattern/deviations: Step-to pattern;Decreased stride length;Decreased weight shift to left;Decreased weight shift to right;Decreased step length - right;Decreased step length - left Gait velocity: reduced Gait velocity interpretation: <1.31 ft/sec, indicative of household ambulator General Gait Details: pt with small lateral steps towards R at EOB with bil HHA and minAx2 to steady, maintain upright, and to facilitate wt shift for step. Pt sitting quickly and impulsively upon fatigue.   Stairs  Wheelchair Mobility    Modified Rankin (Stroke Patients Only)       Balance Overall balance assessment: Needs  assistance Sitting-balance support: Bilateral upper extremity supported;Feet supported Sitting balance-Leahy Scale: Poor Sitting balance - Comments: Sitting at EOB with Min A to prevent excessive anterior lean or R lateral lean, repeated cues for flat feet placement as pt tends to cross ankles. Cues to keep R hand on EOB as it tends to slip anteriorly with fatigue. Postural control: Other (comment);Right lateral lean (anterior lean) Standing balance support: Bilateral upper extremity supported;During functional activity Standing balance-Leahy Scale: Poor Standing balance comment: Reliant on bil UE support and minAx2                            Cognition Arousal/Alertness: Awake/alert Behavior During Therapy: Flat affect Overall Cognitive Status: Impaired/Different from baseline Area of Impairment: Attention;Following commands;Safety/judgement;Rancho level;Awareness;Memory;Problem solving               Rancho Levels of Cognitive Functioning Rancho Los Amigos Scales of Cognitive Functioning: Confused/inappropriate/non-agitated   Current Attention Level: Focused Memory: Decreased short-term memory;Decreased recall of precautions Following Commands: Follows one step commands inconsistently;Follows one step commands with increased time Safety/Judgement: Decreased awareness of safety;Decreased awareness of deficits Awareness: Intellectual Problem Solving: Slow processing;Decreased initiation;Requires verbal cues;Requires tactile cues;Difficulty sequencing General Comments: Pt needing extra time to process and respond to simple multi-modal cues, benefitting from his name being stated prior to cue. Pt with flat affect throughout. Pt needing cues to correct posture and hand or feet placement to maintain balance and safety. Difficulty sequencing steps at EOB.      Exercises      General Comments General comments (skin integrity, edema, etc.): HR 110-120 throughout (primarily  staying at 117bpm); RR up to 46; SpO2 >/= 95% on CPAP vent settings PEEP 5, 40% conc      Pertinent Vitals/Pain Pain Assessment: Faces Faces Pain Scale: Hurts even more Pain Location: generalized grimacing with sitting EOB or when getting deep suction by RN Pain Descriptors / Indicators: Grimacing Pain Intervention(s): Limited activity within patient's tolerance;Monitored during session;Repositioned;Patient requesting pain meds-RN notified    Home Living                      Prior Function            PT Goals (current goals can now be found in the care plan section) Acute Rehab PT Goals Patient Stated Goal: to decrease pain PT Goal Formulation: Patient unable to participate in goal setting Time For Goal Achievement: 10/03/20 Potential to Achieve Goals: Fair Progress towards PT goals: Progressing toward goals    Frequency    Min 3X/week      PT Plan Current plan remains appropriate;Equipment recommendations need to be updated    Co-evaluation PT/OT/SLP Co-Evaluation/Treatment: Yes Reason for Co-Treatment: Complexity of the patient's impairments (multi-system involvement);Necessary to address cognition/behavior during functional activity;For patient/therapist safety;To address functional/ADL transfers PT goals addressed during session: Mobility/safety with mobility;Balance        AM-PAC PT "6 Clicks" Mobility   Outcome Measure  Help needed turning from your back to your side while in a flat bed without using bedrails?: A Lot Help needed moving from lying on your back to sitting on the side of a flat bed without using bedrails?: A Lot Help needed moving to and from a bed to a chair (including a wheelchair)?: A Lot Help needed standing up from  a chair using your arms (e.g., wheelchair or bedside chair)?: A Lot Help needed to walk in hospital room?: Total Help needed climbing 3-5 steps with a railing? : Total 6 Click Score: 10    End of Session Equipment  Utilized During Treatment: Gait belt;Back brace;Cervical collar;Oxygen Activity Tolerance: Patient tolerated treatment well Patient left: in bed;with call bell/phone within reach;with bed alarm set;with nursing/sitter in room;with restraints reapplied;with SCD's reapplied;with family/visitor present (bed in chair-like position) Nurse Communication: Mobility status;Patient requests pain meds PT Visit Diagnosis: Muscle weakness (generalized) (M62.81);Difficulty in walking, not elsewhere classified (R26.2);Unsteadiness on feet (R26.81);Other abnormalities of gait and mobility (R26.89)     Time: 9326-7124 PT Time Calculation (min) (ACUTE ONLY): 36 min  Charges:  $Therapeutic Activity: 8-22 mins                     Raymond Gurney, PT, DPT Acute Rehabilitation Services  Pager: [phone] Office: [phone]    Jewel Baize 09/19/2020, 10:03 AM

## 2020-09-19 NOTE — Progress Notes (Signed)
Patient ID: Charles Weeks, male   DOB: 1965-09-12, 55 y.o.   MRN: [id number] Follow up - Trauma Critical Care  Patient Details:    Charles Weeks is an 55 y.o. male.  Lines/tubes : Gastrostomy/Enterostomy Percutaneous endoscopic gastrostomy (PEG) 20 Fr. LUQ (Active)  Surrounding Skin Dry;Intact 09/19/20 0800  Tube Status Patent 09/19/20 0800  Drainage Appearance None 09/18/20 0742  Dressing Status Clean;Dry;Intact 09/19/20 0800  Dressing Intervention Other (Comment) 09/15/20 0800  Dressing Type Abdominal Binder;Split gauze 09/19/20 0800  G Port Intake (mL) 20 ml 09/19/20 0338     External Urinary Catheter (Active)  Collection Container Standard drainage bag 09/19/20 0800  Suction (Verified suction is between 40-80 mmHg) N/A (Patient has condom catheter) 09/19/20 0800  Securement Method Securing device (Describe) 09/19/20 0800  Site Assessment Clean;Intact 09/19/20 0800  Intervention Equipment Changed 09/19/20 0600  Output (mL) 60 mL 09/19/20 0600    Microbiology/Sepsis markers: Results for orders placed or performed during the hospital encounter of 08/17/20  Resp Panel by RT-PCR (Flu A&B, Covid) Nasopharyngeal Swab     Status: None   Collection Time: 08/17/20  3:40 PM   Specimen: Nasopharyngeal Swab; Nasopharyngeal(NP) swabs in vial transport medium  Result Value Ref Range Status   SARS Coronavirus 2 by RT PCR NEGATIVE NEGATIVE Final    Comment: (NOTE) SARS-CoV-2 target nucleic acids are NOT DETECTED.  The SARS-CoV-2 RNA is generally detectable in upper respiratory specimens during the acute phase of infection. The lowest concentration of SARS-CoV-2 viral copies this assay can detect is 138 copies/mL. A negative result does not preclude SARS-Cov-2 infection and should not be used as the sole basis for treatment or other patient management decisions. A negative result may occur with  improper specimen collection/handling, submission of specimen other than nasopharyngeal  swab, presence of viral mutation(s) within the areas targeted by this assay, and inadequate number of viral copies(<138 copies/mL). A negative result must be combined with clinical observations, patient history, and epidemiological information. The expected result is Negative.  Fact Sheet for Patients:  BloggerCourse.com  Fact Sheet for Healthcare Providers:  SeriousBroker.it  This test is no t yet approved or cleared by the [place] FDA and  has been authorized for detection and/or diagnosis of SARS-CoV-2 by FDA under an Emergency Use Authorization (EUA). This EUA will remain  in effect (meaning this test can be used) for the duration of the COVID-19 declaration under Section 564(b)(1) of the Act, 21 U.S.C.section 360bbb-3(b)(1), unless the authorization is terminated  or revoked sooner.       Influenza A by PCR NEGATIVE NEGATIVE Final   Influenza B by PCR NEGATIVE NEGATIVE Final    Comment: (NOTE) The Xpert Xpress SARS-CoV-2/FLU/RSV plus assay is intended as an aid in the diagnosis of influenza from Nasopharyngeal swab specimens and should not be used as a sole basis for treatment. Nasal washings and aspirates are unacceptable for Xpert Xpress SARS-CoV-2/FLU/RSV testing.  Fact Sheet for Patients: BloggerCourse.com  Fact Sheet for Healthcare Providers: SeriousBroker.it  This test is not yet approved or cleared by the [place] FDA and has been authorized for detection and/or diagnosis of SARS-CoV-2 by FDA under an Emergency Use Authorization (EUA). This EUA will remain in effect (meaning this test can be used) for the duration of the COVID-19 declaration under Section 564(b)(1) of the Act, 21 U.S.C. section 360bbb-3(b)(1), unless the authorization is terminated or revoked.  Performed at [facility], [address], [place], [place] [address]   Surgical PCR  screen  Status: None   Collection Time: 08/19/20  7:38 AM   Specimen: Nasal Mucosa; Nasal Swab  Result Value Ref Range Status   MRSA, PCR NEGATIVE NEGATIVE Final   Staphylococcus aureus NEGATIVE NEGATIVE Final    Comment: (NOTE) The Xpert SA Assay (FDA approved for NASAL specimens in patients 7 years of age and older), is one component of a comprehensive surveillance program. It is not intended to diagnose infection nor to guide or monitor treatment. Performed at [facility], [address], [place], [place] [address]     Anti-infectives:  Anti-infectives (From admission, onward)   Start     Dose/Rate Route Frequency Ordered Stop   08/19/20 0845  cefTRIAXone (ROCEPHIN) 2 g in sodium chloride 0.9 % 100 mL IVPB  Status:  Discontinued        2 g 200 mL/hr over 30 Minutes Intravenous Every 24 hours 08/19/20 0753 08/25/20 1304   08/17/20 1515  ceFAZolin (ANCEF) IVPB 2g/100 mL premix        2 g 200 mL/hr over 30 Minutes Intravenous  Once 08/17/20 1500 08/17/20 1530      Subjective:    Overnight Issues:   Objective:  Vital signs for last 24 hours: Temp:  [98.2 F (36.8 C)-99.5 F (37.5 C)] 99.5 F (37.5 C) (05/04 0800) Pulse Rate:  [84-122] 111 (05/04 0800) Resp:  [12-33] 23 (05/04 0800) BP: (105-149)/(68-95) 149/95 (05/04 0800) SpO2:  [92 %-100 %] 100 % (05/04 0800) FiO2 (%):  [40 %] 40 % (05/04 0800) Weight:  [84.2 kg] 84.2 kg (05/04 0500)  Hemodynamic parameters for last 24 hours:    Intake/Output from previous day: 05/03 0701 - 05/04 0700 In: 2840 [NG/GT:2640] Out: 2885 [Urine:2885]  Intake/Output this shift: Total I/O In: 260 [NG/GT:260] Out: -   Vent settings for last 24 hours: Vent Mode: PSV;CPAP FiO2 (%):  [40 %] 40 % Set Rate:  [18 bmp] 18 bmp Vt Set:  [480 mL] 480 mL PEEP:  [5 cmH20] 5 cmH20 Pressure Support:  [10 cmH20-12 cmH20] 12 cmH20 Plateau Pressure:  [18 cmH20-25 cmH20] 19 cmH20  Physical Exam:  General: alert and no respiratory  distress Neuro: alert and F/C HEENT/Neck: trach-clean, intact and conjunctival edema and hem Resp: clear to auscultation bilaterally CVS: RRR GI: soft, nontender, BS WNL, no r/g and PEG Extremities: edema 1+  Results for orders placed or performed during the hospital encounter of 08/17/20 (from the past 24 hour(s))  Glucose, capillary     Status: Abnormal   Collection Time: 09/18/20 11:48 AM  Result Value Ref Range   Glucose-Capillary 127 (H) 70 - 99 mg/dL  Glucose, capillary     Status: Abnormal   Collection Time: 09/18/20  4:00 PM  Result Value Ref Range   Glucose-Capillary 154 (H) 70 - 99 mg/dL  Glucose, capillary     Status: Abnormal   Collection Time: 09/18/20  7:51 PM  Result Value Ref Range   Glucose-Capillary 137 (H) 70 - 99 mg/dL  Glucose, capillary     Status: Abnormal   Collection Time: 09/18/20 11:43 PM  Result Value Ref Range   Glucose-Capillary 154 (H) 70 - 99 mg/dL  Glucose, capillary     Status: Abnormal   Collection Time: 09/19/20  4:01 AM  Result Value Ref Range   Glucose-Capillary 131 (H) 70 - 99 mg/dL  Glucose, capillary     Status: Abnormal   Collection Time: 09/19/20  8:08 AM  Result Value Ref Range   Glucose-Capillary 131 (H)  70 - 99 mg/dL    Assessment & Plan: Present on Admission: . Thoracic spine fracture (HCC)    LOS: 33 days   Additional comments:I reviewed the patient's new clinical lab test results. . Fall from height  R PTX, B effusion Multiple B rib fxs- these are at the junction of the thoracic vertebral bodies, pain control 3 column T8 vertebral body fx-perDr. Jones,non-op now, plan mobilize in brace once off vent. Needs CTO brace for HOB>30 degrees. T7 neural arch fx- per nsgy R TVP T4-8 - pain control, per nsgy R C7 TVP fx through foramen- C collar per Dr. Yetta Barre, CTAnegative Large scalp laceration/right eyebrow laceration- repair by Dr. Lovick4/05/2020, sutures removed Right neck abrasion/skin tear- bacitracin  BID Facial ecchymosis- CT maxillofacialdemonstrates no facial bone fractures. Some anterior subluxation of the right mandibular condyle HTN- home lopressor 25mg  daily CAD/H/O MI/DES- on ASA at home. Hold for now. Monitor, on tele Acute hypercarbic ventilator dependent respiratory failure/COPD-Trach/PEG 4/18by Dr. 03-05-1974. HTC trial Rash on back- benadryl Hypotension- resolved Depression/anxiety- home bupropion whentaking PO, home buspar BPH- home terazosin, repeatedly failedvoiding trials, increased bethanachol 4/15. I&O q6h ABL anemia- stable FEN-TF,bowel regimen VTE-LMWH Dispo- ICU;plan home on vent (if needed), adjusted klon/sero to help sleep at night and wean during day. I spoke with his sister at the bedside. Guaifenesin and scop patch for secretions Critical Care Total Time*: 32 Minutes  5/15, MD, MPH, FACS Trauma & General Surgery Use [email or web address] to contact on call provider  09/19/2020  *Care during the described time interval was provided by me. I have reviewed this patient's available data, including medical history, events of note, physical examination and test results as part of my evaluation.

## 2020-09-19 NOTE — Progress Notes (Signed)
Orthopedic Tech Progress Note Patient Details:  Charles Weeks 1966-04-21 [id number] Therapy called requesting an extension to patient back brace. Dropped off to room Patient ID: [name], male   DOB: 1965-12-02, 55 y.o.   MRN: [id number]   [name] 09/19/2020, 3:27 PM

## 2020-09-20 LAB — CBC
HCT: 31.6 % — ABNORMAL LOW (ref 39.0–52.0)
Hemoglobin: 9.7 g/dL — ABNORMAL LOW (ref 13.0–17.0)
MCH: 28.9 pg (ref 26.0–34.0)
MCHC: 30.7 g/dL (ref 30.0–36.0)
MCV: 94 fL (ref 80.0–100.0)
Platelets: 564 10*3/uL — ABNORMAL HIGH (ref 150–400)
RBC: 3.36 MIL/uL — ABNORMAL LOW (ref 4.22–5.81)
RDW: 16.5 % — ABNORMAL HIGH (ref 11.5–15.5)
WBC: 19.2 10*3/uL — ABNORMAL HIGH (ref 4.0–10.5)
nRBC: 0 % (ref 0.0–0.2)

## 2020-09-20 LAB — BASIC METABOLIC PANEL
Anion gap: 10 (ref 5–15)
BUN: 19 mg/dL (ref 6–20)
CO2: 29 mmol/L (ref 22–32)
Calcium: 9.2 mg/dL (ref 8.9–10.3)
Chloride: 99 mmol/L (ref 98–111)
Creatinine, Ser: 0.51 mg/dL — ABNORMAL LOW (ref 0.61–1.24)
GFR, Estimated: >60 mL/min (ref >60)
Glucose, Bld: 117 mg/dL — ABNORMAL HIGH (ref 70–99)
Potassium: 4.2 mmol/L (ref 3.5–5.1)
Sodium: 138 mmol/L (ref 135–145)

## 2020-09-20 LAB — GLUCOSE, CAPILLARY
Glucose-Capillary: 129 mg/dL — ABNORMAL HIGH (ref 70–99)
Glucose-Capillary: 151 mg/dL — ABNORMAL HIGH (ref 70–99)
Glucose-Capillary: 144 mg/dL — ABNORMAL HIGH (ref 70–99)
Glucose-Capillary: 108 mg/dL — ABNORMAL HIGH (ref 70–99)
Glucose-Capillary: 140 mg/dL — ABNORMAL HIGH (ref 70–99)
Glucose-Capillary: 125 mg/dL — ABNORMAL HIGH (ref 70–99)

## 2020-09-20 NOTE — Progress Notes (Signed)
  Speech Language Pathology Treatment: Charles Weeks Speaking valve;Cognitive-Linquistic  Patient Details Name: Charles Weeks MRN: [id number] DOB: [birth year] Today's Date: 09/20/2020 Time: 1025-8527 SLP Time Calculation (min) (ACUTE ONLY): 23 min  Assessment / Plan / Recommendation Clinical Impression  Charles Weeks seen for PMV on trach collar with sister at bedside. Copious secretions with RN preparing to suction on arrival. O2 sats decreased only after suctioning and after time to recover valve remained in place mobilization of air to upper airway. Barrier to intelligibility is not vocal quality but intensity and increase rate of speech without precise articulation. Adequate intensity produced x 3 given after reminders and demonstration. HR 95, RR 17. He stated "fall" without cues but additional time to respond. Accurately read "5" and "May" on calendar brought in line of vision. He was unable to retain place after cues and choices. Demonstrated some degree of problem solving by picking up his right hand with his left. He is slowly making progress towards goals.   HPI HPI: Pt is a 55 y.o. male who presented 4/1 after falling down about 11 stairs (sister reported fell over a half wall from second floor) and sustained R PTX, anterior subluxation of R mandibular condyle, multiple bil ribs fxs at junction of thoracic vertebral bodies T5-8 on R and T7-8 on L, 3 column T8 vertebral body fx, T7 neural arch fx, R TVP T4-8 fxs, and R C7 TVP fx through foramen. R chest tube 4/1. Pt found to be hypoxic and was intubated 4/2. Received trach and PEG placement 4/18. S/p L chest tube placement 4/19. PMH: HTN, COPD, CAD, s/p MI with DES RCA in Jan 2020 just on ASA, depression/anxiety, and BPH.      SLP Plan  Continue with current plan of care       Recommendations         Patient may use Passy-Muir Speech Valve: with SLP only PMSV Supervision: Full         Oral Care Recommendations: Oral care QID Follow up  Recommendations: Skilled Nursing facility;24 hour supervision/assistance SLP Visit Diagnosis: Aphonia (R49.1);Cognitive communication deficit (P82.423) Plan: Continue with current plan of care       GO                Charles Weeks 09/20/2020, 11:28 AM Charles Weeks Charles Weeks.Ed Nurse, children's [phone] Office [phone]

## 2020-09-20 NOTE — Progress Notes (Signed)
Physical Therapy Treatment Patient Details Name: Charles Weeks MRN: [id number] DOB: [birth year] Today's Date: 09/20/2020    History of Present Illness 55 y.o. male who presented 4/1 after falling down about 11 stairs and sustained R PTX, anterior subluxation of R mandibular condyle, multiple bil ribs fxs at junction of thoracic vertebral bodies T5-8 on R and T7-8 on L, 3 column T8 vertebral body fx, T7 neural arch fx, R TVP T4-8 fxs, and R C7 TVP fx through foramen. R chest tube inserted 4/1. Pt found to be hypoxic and was intubated 4/2. No evidence of vascular injury to neck, facial fx, acute intracranial injury, lumbar spine fx, R elbow acute abnormality, or pelvic fx identified on imaging. S/p trach and PEG placement 4/18. S/p L chest tube placement 4/19. PMH: HTN, COPD, CAD, s/p MI with DES RCA in Jan 2020 just on ASA, depression/anxiety, and BPH.    PT Comments    Pt continuing to improve with activity tolerance, standing and taking several side steps at EOB 3x today with bil HHA. However, pt fatigues quickly, impulsively sitting after ambulating only ~1-2 ft per bout with minAx2. Pt demonstrating a L lateral lean bias in sitting and standing, inhibiting appropriate weight shifting to R to take steps with L foot, likely due to his reported R lower back pain this date. Pt needing modAx2 to power up to stand today, likely due to increased pain compared to prior session. Pt continuing to need increased time to process simple cues. Will continue to follow acutely. Current recommendations remain appropriate.    Follow Up Recommendations  Supervision/Assistance - 24 hour;SNF;LTACH     Equipment Recommendations  Wheelchair (measurements PT);Wheelchair cushion (measurements PT);Hospital bed;Rolling walker with 5" wheels;3in1 (PT)    Recommendations for Other Services       Precautions / Restrictions Precautions Precautions: Cervical;Back Precaution Booklet Issued: No Precaution Comments:  trach on vent, bilateral wrist restraints, TLSO donned with rolling if HOB > 30 deg, cervical collar at all times, abdominal binder to protect PEG Required Braces or Orthoses: Cervical Brace;Spinal Brace Cervical Brace: Hard collar;At all times Spinal Brace: Thoracolumbosacral orthotic;Applied in supine position Restrictions Weight Bearing Restrictions: No    Mobility  Bed Mobility Overal bed mobility: Needs Assistance Bed Mobility: Rolling;Sidelying to Sit;Sit to Sidelying Rolling: Mod assist Sidelying to sit: Max assist;+2 for physical assistance     Sit to sidelying: Max assist;+2 for physical assistance General bed mobility comments: modA to roll, but maxA of 2 to manage bil legs off EOB and trunk for supine <> sit R EOB and management of lines. Cues provided to lean onto elbow to control trunk with descent to supine.    Transfers Overall transfer level: Needs assistance Equipment used: 2 person hand held assist Transfers: Sit to/from Stand Sit to Stand: +2 safety/equipment;+2 physical assistance;Mod assist         General transfer comment: Bil HHA with personnel on either side of pt, cuing pt to push up thrpugh arms of personnel to stand, modAx2 to power up and steady from elevated EOB, x3 reps.  Ambulation/Gait Ambulation/Gait assistance: +2 physical assistance;Min assist;+2 safety/equipment Gait Distance (Feet): 2 Feet (x3 bouts of ~1-2 ft per bout) Assistive device: 2 person hand held assist Gait Pattern/deviations: Step-to pattern;Decreased stride length;Decreased weight shift to left;Decreased weight shift to right;Decreased step length - right;Decreased step length - left Gait velocity: reduced Gait velocity interpretation: <1.31 ft/sec, indicative of household ambulator General Gait Details: pt with small lateral steps towards R at EOB with  bil HHA and minAx2 to steady, maintain upright, and to facilitate wt shift for step. Pt sitting quickly and impulsively upon  fatigue each bout. Pt with tendency to shift weight to L more this date and avoid R, likely due to R lower back pain.   Stairs             Wheelchair Mobility    Modified Rankin (Stroke Patients Only)       Balance Overall balance assessment: Needs assistance Sitting-balance support: Bilateral upper extremity supported;Feet supported Sitting balance-Leahy Scale: Poor Sitting balance - Comments: Sitting at EOB with Min guard majority of time and up to minA to prevent excessive anterior lean or L lateral lean as pt fatigued. Postural control: Other (comment);Left lateral lean (anterior lean) Standing balance support: Bilateral upper extremity supported;During functional activity Standing balance-Leahy Scale: Poor Standing balance comment: Reliant on bil UE support and minAx2                            Cognition Arousal/Alertness: Awake/alert Behavior During Therapy: Flat affect;Impulsive Overall Cognitive Status: Impaired/Different from baseline Area of Impairment: Attention;Following commands;Safety/judgement;Rancho level;Awareness;Memory;Problem solving               Rancho Levels of Cognitive Functioning Rancho Los Amigos Scales of Cognitive Functioning: Confused/inappropriate/non-agitated   Current Attention Level: Focused Memory: Decreased short-term memory;Decreased recall of precautions Following Commands: Follows one step commands with increased time;Follows one step commands consistently Safety/Judgement: Decreased awareness of safety;Decreased awareness of deficits Awareness: Intellectual Problem Solving: Slow processing;Decreased initiation;Requires verbal cues;Requires tactile cues;Difficulty sequencing General Comments: Pt needing extra time to process (up to ~10 seconds) and respond to simple multi-modal cues, benefitting from his name being stated prior to cue. Pt with flat affect throughout. Pt needing cues to correct posture and hand or feet  placement to maintain balance and safety. Difficulty sequencing steps at EOB. Impulsuve to sit when fatigued.      Exercises General Exercises - Lower Extremity Ankle Circles/Pumps: Both;5 reps;Supine    General Comments General comments (skin integrity, edema, etc.): SpO2 as low as 89% with suctioning by RN during session, improved to >/= 95%; Trach on vent CPAP setting 40% conc 5 PEEP; Educated pt on performing ankle pumps in bed, emphasizing ankle dorsiflexion AROM      Pertinent Vitals/Pain Pain Assessment: Faces Faces Pain Scale: Hurts even more Pain Location: R lower back Pain Descriptors / Indicators: Grimacing;Guarding;Discomfort Pain Intervention(s): Monitored during session;Limited activity within patient's tolerance;Repositioned (notified RN)    Home Living                      Prior Function            PT Goals (current goals can now be found in the care plan section) Acute Rehab PT Goals Patient Stated Goal: to decrease pain, agreeable to session PT Goal Formulation: Patient unable to participate in goal setting Time For Goal Achievement: 10/03/20 Potential to Achieve Goals: Fair Progress towards PT goals: Progressing toward goals    Frequency    Min 3X/week      PT Plan Discharge plan needs to be updated    Co-evaluation              AM-PAC PT "6 Clicks" Mobility   Outcome Measure  Help needed turning from your back to your side while in a flat bed without using bedrails?: A Lot Help needed moving from lying on your  back to sitting on the side of a flat bed without using bedrails?: A Lot Help needed moving to and from a bed to a chair (including a wheelchair)?: A Lot Help needed standing up from a chair using your arms (e.g., wheelchair or bedside chair)?: A Lot Help needed to walk in hospital room?: Total Help needed climbing 3-5 steps with a railing? : Total 6 Click Score: 10    End of Session Equipment Utilized During Treatment:  Gait belt;Back brace;Cervical collar;Oxygen Activity Tolerance: Patient tolerated treatment well Patient left: in bed;with call bell/phone within reach;with bed alarm set;with nursing/sitter in room;with restraints reapplied (bed in chair-like position; RN requesting break for SCDs) Nurse Communication: Mobility status (pt reports pain) PT Visit Diagnosis: Muscle weakness (generalized) (M62.81);Difficulty in walking, not elsewhere classified (R26.2);Unsteadiness on feet (R26.81);Other abnormalities of gait and mobility (R26.89)     Time: 9678-9381 PT Time Calculation (min) (ACUTE ONLY): 37 min  Charges:  $Gait Training: 8-22 mins $Therapeutic Activity: 8-22 mins                     Charles Weeks, PT, DPT Acute Rehabilitation Services  Pager: [phone] Office: [phone]    Charles Weeks 09/20/2020, 10:01 AM
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
## 2020-09-20 NOTE — Progress Notes (Signed)
Patient ID: Charles Weeks, male   DOB: 12/23/1965, 55 y.o.   MRN: [id number] Follow up - Trauma Critical Care  Patient Details:    Charles Weeks is an 55 y.o. male.  Lines/tubes : Gastrostomy/Enterostomy Percutaneous endoscopic gastrostomy (PEG) 20 Fr. LUQ (Active)  Surrounding Skin Dry;Intact 09/19/20 2000  Tube Status Patent 09/19/20 2000  Drainage Appearance None 09/18/20 0742  Dressing Status Clean;Intact;Dry 09/19/20 2000  Dressing Intervention Dressing reinforced 09/19/20 2000  Dressing Type Split gauze;Abdominal Binder 09/19/20 2000  G Port Intake (mL) 20 ml 09/19/20 0338     External Urinary Catheter (Active)  Collection Container Standard drainage bag 09/19/20 2000  Suction (Verified suction is between 40-80 mmHg) N/A (Patient has condom catheter) 09/19/20 2000  Securement Method Securing device (Describe) 09/19/20 2000  Site Assessment Clean;Intact 09/19/20 2000  Intervention Equipment Changed 09/19/20 0600  Output (mL) 800 mL 09/20/20 0600    Microbiology/Sepsis markers: Results for orders placed or performed during the hospital encounter of 08/17/20  Resp Panel by RT-PCR (Flu A&B, Covid) Nasopharyngeal Swab     Status: None   Collection Time: 08/17/20  3:40 PM   Specimen: Nasopharyngeal Swab; Nasopharyngeal(NP) swabs in vial transport medium  Result Value Ref Range Status   SARS Coronavirus 2 by RT PCR NEGATIVE NEGATIVE Final    Comment: (NOTE) SARS-CoV-2 target nucleic acids are NOT DETECTED.  The SARS-CoV-2 RNA is generally detectable in upper respiratory specimens during the acute phase of infection. The lowest concentration of SARS-CoV-2 viral copies this assay can detect is 138 copies/mL. A negative result does not preclude SARS-Cov-2 infection and should not be used as the sole basis for treatment or other patient management decisions. A negative result may occur with  improper specimen collection/handling, submission of specimen other than  nasopharyngeal swab, presence of viral mutation(s) within the areas targeted by this assay, and inadequate number of viral copies(<138 copies/mL). A negative result must be combined with clinical observations, patient history, and epidemiological information. The expected result is Negative.  Fact Sheet for Patients:  BloggerCourse.com  Fact Sheet for Healthcare Providers:  SeriousBroker.it  This test is no t yet approved or cleared by the [place] FDA and  has been authorized for detection and/or diagnosis of SARS-CoV-2 by FDA under an Emergency Use Authorization (EUA). This EUA will remain  in effect (meaning this test can be used) for the duration of the COVID-19 declaration under Section 564(b)(1) of the Act, 21 U.S.C.section 360bbb-3(b)(1), unless the authorization is terminated  or revoked sooner.       Influenza A by PCR NEGATIVE NEGATIVE Final   Influenza B by PCR NEGATIVE NEGATIVE Final    Comment: (NOTE) The Xpert Xpress SARS-CoV-2/FLU/RSV plus assay is intended as an aid in the diagnosis of influenza from Nasopharyngeal swab specimens and should not be used as a sole basis for treatment. Nasal washings and aspirates are unacceptable for Xpert Xpress SARS-CoV-2/FLU/RSV testing.  Fact Sheet for Patients: BloggerCourse.com  Fact Sheet for Healthcare Providers: SeriousBroker.it  This test is not yet approved or cleared by the [place] FDA and has been authorized for detection and/or diagnosis of SARS-CoV-2 by FDA under an Emergency Use Authorization (EUA). This EUA will remain in effect (meaning this test can be used) for the duration of the COVID-19 declaration under Section 564(b)(1) of the Act, 21 U.S.C. section 360bbb-3(b)(1), unless the authorization is terminated or revoked.  Performed at [facility], [address] [place], [place] [address]    Surgical PCR screen  Status: None   Collection Time: 08/19/20  7:38 AM   Specimen: Nasal Mucosa; Nasal Swab  Result Value Ref Range Status   MRSA, PCR NEGATIVE NEGATIVE Final   Staphylococcus aureus NEGATIVE NEGATIVE Final    Comment: (NOTE) The Xpert SA Assay (FDA approved for NASAL specimens in patients 47 years of age and older), is one component of a comprehensive surveillance program. It is not intended to diagnose infection nor to guide or monitor treatment. Performed at [facility], [address] [place], [place] [address]   MRSA PCR Screening     Status: None   Collection Time: 09/19/20  7:40 AM   Specimen: Nasal Mucosa; Nasopharyngeal  Result Value Ref Range Status   MRSA by PCR NEGATIVE NEGATIVE Final    Comment:        The GeneXpert MRSA Assay (FDA approved for NASAL specimens only), is one component of a comprehensive MRSA colonization surveillance program. It is not intended to diagnose MRSA infection nor to guide or monitor treatment for MRSA infections. Performed at [facility], [address] [place], [place] [address]     Anti-infectives:  Anti-infectives (From admission, onward)   Start     Dose/Rate Route Frequency Ordered Stop   08/19/20 0845  cefTRIAXone (ROCEPHIN) 2 g in sodium chloride 0.9 % 100 mL IVPB  Status:  Discontinued        2 g 200 mL/hr over 30 Minutes Intravenous Every 24 hours 08/19/20 0753 08/25/20 1304   08/17/20 1515  ceFAZolin (ANCEF) IVPB 2g/100 mL premix        2 g 200 mL/hr over 30 Minutes Intravenous  Once 08/17/20 1500 08/17/20 1530     Consults: Treatment Team:  Tia Alert, MD    Studies:    Events:  Subjective:    Overnight Issues:   Objective:  Vital signs for last 24 hours: Temp:  [98 F (36.7 C)-99.4 F (37.4 C)] 98.8 F (37.1 C) (05/05 0400) Pulse Rate:  [89-125] 115 (05/05 0818) Resp:  [16-37] 29 (05/05 0747) BP: (110-187)/(67-119) 144/78 (05/05 0818) SpO2:  [84 %-100 %] 97  % (05/05 0747) FiO2 (%):  [40 %] 40 % (05/05 0747)  Hemodynamic parameters for last 24 hours:    Intake/Output from previous day: 05/04 0701 - 05/05 0700 In: 2580 [NG/GT:2580] Out: 800 [Urine:800]  Intake/Output this shift: No intake/output data recorded.  Vent settings for last 24 hours: Vent Mode: PSV;CPAP FiO2 (%):  [40 %] 40 % Set Rate:  [18 bmp-22 bmp] 22 bmp Vt Set:  [480 mL] 480 mL PEEP:  [5 cmH20] 5 cmH20 Pressure Support:  [8 cmH20] 8 cmH20 Plateau Pressure:  [12 cmH20-18 cmH20] 13 cmH20  Physical Exam:  General: no respiratory distress Neuro: alert and f/c HEENT/Neck: trach-clean, intact Resp: clear to auscultation bilaterally CVS: RRR GI: soft, NT, PEG in place Extremities: edema 1+  Results for orders placed or performed during the hospital encounter of 08/17/20 (from the past 24 hour(s))  Glucose, capillary     Status: Abnormal   Collection Time: 09/19/20 12:12 PM  Result Value Ref Range   Glucose-Capillary 144 (H) 70 - 99 mg/dL  Glucose, capillary     Status: Abnormal   Collection Time: 09/19/20  3:47 PM  Result Value Ref Range   Glucose-Capillary 141 (H) 70 - 99 mg/dL  Glucose, capillary     Status: Abnormal   Collection Time: 09/19/20  8:03 PM  Result Value Ref Range   Glucose-Capillary  142 (H) 70 - 99 mg/dL  Glucose, capillary     Status: Abnormal   Collection Time: 09/19/20 11:42 PM  Result Value Ref Range   Glucose-Capillary 108 (H) 70 - 99 mg/dL  CBC     Status: Abnormal   Collection Time: 09/20/20  1:48 AM  Result Value Ref Range   WBC 19.2 (H) 4.0 - 10.5 K/uL   RBC 3.36 (L) 4.22 - 5.81 MIL/uL   Hemoglobin 9.7 (L) 13.0 - 17.0 g/dL   HCT 11.9 (L) 14.7 - 82.9 %   MCV 94.0 80.0 - 100.0 fL   MCH 28.9 26.0 - 34.0 pg   MCHC 30.7 30.0 - 36.0 g/dL   RDW 56.2 (H) 13.0 - 86.5 %   Platelets 564 (H) 150 - 400 K/uL   nRBC 0.0 0.0 - 0.2 %  Basic metabolic panel     Status: Abnormal   Collection Time: 09/20/20  1:48 AM  Result Value Ref Range    Sodium 138 135 - 145 mmol/L   Potassium 4.2 3.5 - 5.1 mmol/L   Chloride 99 98 - 111 mmol/L   CO2 29 22 - 32 mmol/L   Glucose, Bld 117 (H) 70 - 99 mg/dL   BUN 19 6 - 20 mg/dL   Creatinine, Ser 7.84 (L) 0.61 - 1.24 mg/dL   Calcium 9.2 8.9 - 69.6 mg/dL   GFR, Estimated >29 >52 mL/min   Anion gap 10 5 - 15  Glucose, capillary     Status: Abnormal   Collection Time: 09/20/20  3:37 AM  Result Value Ref Range   Glucose-Capillary 129 (H) 70 - 99 mg/dL  Glucose, capillary     Status: Abnormal   Collection Time: 09/20/20  8:07 AM  Result Value Ref Range   Glucose-Capillary 151 (H) 70 - 99 mg/dL    Assessment & Plan: Present on Admission: . Thoracic spine fracture (HCC)    LOS: 34 days   Additional comments:I reviewed the patient's new clinical lab test results. . Fall from height  R PTX, B effusion Multiple B rib fxs- these are at the junction of the thoracic vertebral bodies, pain control 3 column T8 vertebral body fx-perDr. Jones,non-op now, plan mobilize in brace once off vent. Needs CTO brace for HOB>30 degrees. T7 neural arch fx- per nsgy R TVP T4-8 - pain control, per nsgy R C7 TVP fx through foramen- C collar per Dr. Yetta Barre, CTAnegative Large scalp laceration/right eyebrow laceration- repair by Dr. Lovick4/05/2020, sutures removed Right neck abrasion/skin tear- bacitracin BID Facial ecchymosis- CT maxillofacialdemonstrates no facial bone fractures. Some anterior subluxation of the right mandibular condyle HTN- home lopressor 25mg  daily CAD/H/O MI/DES- on ASA at home. Hold for now. Monitor, on tele Acute hypercarbic ventilator dependent respiratory failure/COPD-Trach/PEG 4/18by Dr. 03-05-1974. HTC trial Rash on back- benadryl Hypotension- resolved Depression/anxiety- home bupropion whentaking PO, home buspar BPH- home terazosin, repeatedly failedvoiding trials, increased bethanachol 4/15. I&O q6h ABL anemia- stable FEN-TF,bowel  regimen VTE-LMWH Dispo- ICU;plan home on vent (if needed), HTC today Guaifenesin and scop patch for secretions Critical Care Total Time*: 34 Minutes  5/15, MD, MPH, FACS Trauma & General Surgery Use [email or web address] to contact on call provider  09/20/2020  *Care during the described time interval was provided by me. I have reviewed this patient's available data, including medical history, events of note, physical examination and test results as part of my evaluation.

## 2020-09-21 ENCOUNTER — Inpatient Hospital Stay (HOSPITAL_COMMUNITY): Payer: Medicaid Other

## 2020-09-21 LAB — GLUCOSE, CAPILLARY
Glucose-Capillary: 121 mg/dL — ABNORMAL HIGH (ref 70–99)
Glucose-Capillary: 138 mg/dL — ABNORMAL HIGH (ref 70–99)
Glucose-Capillary: 137 mg/dL — ABNORMAL HIGH (ref 70–99)
Glucose-Capillary: 134 mg/dL — ABNORMAL HIGH (ref 70–99)
Glucose-Capillary: 133 mg/dL — ABNORMAL HIGH (ref 70–99)

## 2020-09-21 NOTE — Progress Notes (Signed)
Checked skin under collar. Skin on head and neck is in tact and prophylactic dressings were reinforced

## 2020-09-21 NOTE — Progress Notes (Signed)
Physical Therapy Treatment Patient Details Name: Charles Weeks MRN: [id number] DOB: Oct 14, 1965 Today's Date: 09/21/2020    History of Present Illness 55 y.o. male who presented 4/1 after falling down about 11 stairs and sustained R PTX, anterior subluxation of R mandibular condyle, multiple bil ribs fxs at junction of thoracic vertebral bodies T5-8 on R and T7-8 on L, 3 column T8 vertebral body fx, T7 neural arch fx, R TVP T4-8 fxs, and R C7 TVP fx through foramen. R chest tube inserted 4/1. Pt found to be hypoxic and was intubated 4/2. No evidence of vascular injury to neck, facial fx, acute intracranial injury, lumbar spine fx, R elbow acute abnormality, or pelvic fx identified on imaging. S/p trach and PEG placement 4/18. S/p L chest tube placement 4/19. PMH: HTN, COPD, CAD, s/p MI with DES RCA in Jan 2020 just on ASA, depression/anxiety, and BPH.    PT Comments    Planned to attempt to transfer pt to recliner to allow pt to sit up for encouragement of improved pulmonary function this date. However, after back brace was applied in supine via rolling with maxAx2 he became resistive and refused to transition to sitting EOB or getting OOB this date. Pt educated on importance of continual mobility and upright postures to improve his health and functional mobility independence. Pt still refusing, thus respected his wishes and returned him to supine in bed with bed in chair-like position. RN requested raising legs due to pt's restlessness this date, thus altered bed position. Pt is presenting at [facility] level IV this date as he was confused, agitated, not consistently following simple commands majority of time, and aggressively squeezing PT's finger at one point. Pt did follow directions to release PT's finger though. Will continue to follow acutely. Current recommendations remain appropriate.   Follow Up Recommendations  Supervision/Assistance - 24 hour;SNF;LTACH     Equipment Recommendations   Wheelchair (measurements PT);Wheelchair cushion (measurements PT);Hospital bed;Rolling walker with 5" wheels;3in1 (PT)    Recommendations for Other Services       Precautions / Restrictions Precautions Precautions: Cervical;Back Precaution Booklet Issued: No Precaution Comments: trach on vent, bilateral wrist restraints and mittens, TLSO donned with rolling if HOB > 30 deg, cervical collar at all times, abdominal binder to protect PEG Required Braces or Orthoses: Cervical Brace;Spinal Brace Cervical Brace: Hard collar;At all times Spinal Brace: Thoracolumbosacral orthotic;Applied in supine position Restrictions Weight Bearing Restrictions: No    Mobility  Bed Mobility Overal bed mobility: Needs Assistance Bed Mobility: Rolling Rolling: Max assist;+2 for physical assistance;+2 for safety/equipment         General bed mobility comments: MaxAx2 to roll this date as pt not following simple commands consistently. Rolled either direction to donn back brace but then pt actively resisting roll and attempt to transition to sitting EOB, respected pt's desires as he continued to refuse despite pt education on importance of mobility and upright positions, thus returned to supine with bed in chair-like position. Pt did assist some with superior transition in bed through bridging up, maxAx2.    Transfers                 General transfer comment: Pt refused  Ambulation/Gait             General Gait Details: Pt refused   Stairs             Wheelchair Mobility    Modified Rankin (Stroke Patients Only)       Balance  Sitting balance - Comments: Pt refused       Standing balance comment: Pt refused                            Cognition Arousal/Alertness: Awake/alert Behavior During Therapy: Flat affect;Impulsive;Agitated;Restless Overall Cognitive Status: Impaired/Different from baseline Area of Impairment: Attention;Following  commands;Safety/judgement;Rancho level;Awareness;Memory;Problem solving               Rancho Levels of Cognitive Functioning Rancho Los Amigos Scales of Cognitive Functioning: Confused/agitated   Current Attention Level: Selective Memory: Decreased short-term memory;Decreased recall of precautions Following Commands: Follows one step commands with increased time;Follows one step commands inconsistently Safety/Judgement: Decreased awareness of safety;Decreased awareness of deficits Awareness: Intellectual Problem Solving: Slow processing;Decreased initiation;Requires verbal cues;Requires tactile cues;Difficulty sequencing General Comments: Pt agitated and restless this date, at one point grabbing PT's finger tightly. When asked to not grab so hard he did release. Pt resistant to mobility when beginning to transition to sitting EOB, refusing further activity this session despite education on importance of mobility and sitting up to improve lung function. Respected pt's decision and returned pt to supine in bed in chair-like position. Pt inconsistently following single step commands due to resistance and pain this date.      Exercises General Exercises - Lower Extremity Ankle Circles/Pumps: Both;Supine;Other reps (comment) (x3 reps)    General Comments General comments (skin integrity, edema, etc.): Educated pt to perform ankle pumps; Trach on vent 40% conc PEEP of 5      Pertinent Vitals/Pain Pain Assessment: Faces Faces Pain Scale: Hurts even more Pain Location: lower back Pain Descriptors / Indicators: Grimacing;Guarding;Discomfort Pain Intervention(s): Limited activity within patient's tolerance;Monitored during session;Repositioned (notified RN)    Home Living                      Prior Function            PT Goals (current goals can now be found in the care plan section) Acute Rehab PT Goals Patient Stated Goal: to not get OOB PT Goal Formulation: Patient  unable to participate in goal setting Time For Goal Achievement: 10/03/20 Potential to Achieve Goals: Fair Progress towards PT goals: Not progressing toward goals - comment (pt agitating and in pain)    Frequency    Min 3X/week      PT Plan Current plan remains appropriate    Co-evaluation              AM-PAC PT "6 Clicks" Mobility   Outcome Measure  Help needed turning from your back to your side while in a flat bed without using bedrails?: A Lot Help needed moving from lying on your back to sitting on the side of a flat bed without using bedrails?: A Lot Help needed moving to and from a bed to a chair (including a wheelchair)?: A Lot Help needed standing up from a chair using your arms (e.g., wheelchair or bedside chair)?: A Lot Help needed to walk in hospital room?: Total Help needed climbing 3-5 steps with a railing? : Total 6 Click Score: 10    End of Session Equipment Utilized During Treatment: Gait belt;Back brace;Cervical collar;Oxygen Activity Tolerance: Treatment limited secondary to agitation;Patient limited by pain Patient left: in bed;with call bell/phone within reach;with bed alarm set;with nursing/sitter in room;with restraints reapplied;with SCD's reapplied (bed in chair-like position) Nurse Communication: Mobility status (pt reports pain; refusal to get OOB) PT Visit Diagnosis:  Muscle weakness (generalized) (M62.81);Difficulty in walking, not elsewhere classified (R26.2);Unsteadiness on feet (R26.81);Other abnormalities of gait and mobility (R26.89)     Time: 9381-0175 PT Time Calculation (min) (ACUTE ONLY): 29 min  Charges:  $Therapeutic Activity: 23-37 mins                     Raymond Gurney, PT, DPT Acute Rehabilitation Services  Pager: [phone] Office: [phone]    Jewel Baize 09/21/2020, 1:22 PM

## 2020-09-21 NOTE — Progress Notes (Signed)
Patient ID: Charles Weeks, male   DOB: Jan 21, 1966, 54 y.o.   MRN: [id number] Follow up - Trauma Critical Care  Patient Details:    Charles Weeks is an 55 y.o. male.  Lines/tubes : Gastrostomy/Enterostomy Percutaneous endoscopic gastrostomy (PEG) 20 Fr. LUQ (Active)  Surrounding Skin Dry;Intact 09/20/20 2000  Tube Status Patent 09/20/20 2000  Drainage Appearance None 09/20/20 2000  Dressing Status Clean;Dry;Intact 09/20/20 2000  Dressing Intervention Dressing changed 09/20/20 0800  Dressing Type Split gauze;Abdominal Binder 09/20/20 0800  G Port Intake (mL) 20 ml 09/19/20 0338     External Urinary Catheter (Active)  Collection Container Standard drainage bag 09/20/20 2000  Suction (Verified suction is between 40-80 mmHg) N/A (Patient has condom catheter) 09/20/20 2000  Securement Method Securing device (Describe) 09/20/20 2000  Site Assessment Clean;Intact 09/20/20 2000  Intervention Equipment Changed 09/20/20 2000  Output (mL) 350 mL 09/20/20 2000    Microbiology/Sepsis markers: Results for orders placed or performed during the hospital encounter of 08/17/20  Resp Panel by RT-PCR (Flu A&B, Covid) Nasopharyngeal Swab     Status: None   Collection Time: 08/17/20  3:40 PM   Specimen: Nasopharyngeal Swab; Nasopharyngeal(NP) swabs in vial transport medium  Result Value Ref Range Status   SARS Coronavirus 2 by RT PCR NEGATIVE NEGATIVE Final    Comment: (NOTE) SARS-CoV-2 target nucleic acids are NOT DETECTED.  The SARS-CoV-2 RNA is generally detectable in upper respiratory specimens during the acute phase of infection. The lowest concentration of SARS-CoV-2 viral copies this assay can detect is 138 copies/mL. A negative result does not preclude SARS-Cov-2 infection and should not be used as the sole basis for treatment or other patient management decisions. A negative result may occur with  improper specimen collection/handling, submission of specimen other than nasopharyngeal  swab, presence of viral mutation(s) within the areas targeted by this assay, and inadequate number of viral copies(<138 copies/mL). A negative result must be combined with clinical observations, patient history, and epidemiological information. The expected result is Negative.  Fact Sheet for Patients:  BloggerCourse.com  Fact Sheet for Healthcare Providers:  SeriousBroker.it  This test is no t yet approved or cleared by the [place] FDA and  has been authorized for detection and/or diagnosis of SARS-CoV-2 by FDA under an Emergency Use Authorization (EUA). This EUA will remain  in effect (meaning this test can be used) for the duration of the COVID-19 declaration under Section 564(b)(1) of the Act, 21 U.S.C.section 360bbb-3(b)(1), unless the authorization is terminated  or revoked sooner.       Influenza A by PCR NEGATIVE NEGATIVE Final   Influenza B by PCR NEGATIVE NEGATIVE Final    Comment: (NOTE) The Xpert Xpress SARS-CoV-2/FLU/RSV plus assay is intended as an aid in the diagnosis of influenza from Nasopharyngeal swab specimens and should not be used as a sole basis for treatment. Nasal washings and aspirates are unacceptable for Xpert Xpress SARS-CoV-2/FLU/RSV testing.  Fact Sheet for Patients: BloggerCourse.com  Fact Sheet for Healthcare Providers: SeriousBroker.it  This test is not yet approved or cleared by the [place] FDA and has been authorized for detection and/or diagnosis of SARS-CoV-2 by FDA under an Emergency Use Authorization (EUA). This EUA will remain in effect (meaning this test can be used) for the duration of the COVID-19 declaration under Section 564(b)(1) of the Act, 21 U.S.C. section 360bbb-3(b)(1), unless the authorization is terminated or revoked.  Performed at [facility], [address], [place], [place] [address]   Surgical PCR  screen  Status: None   Collection Time: 08/19/20  7:38 AM   Specimen: Nasal Mucosa; Nasal Swab  Result Value Ref Range Status   MRSA, PCR NEGATIVE NEGATIVE Final   Staphylococcus aureus NEGATIVE NEGATIVE Final    Comment: (NOTE) The Xpert SA Assay (FDA approved for NASAL specimens in patients 61 years of age and older), is one component of a comprehensive surveillance program. It is not intended to diagnose infection nor to guide or monitor treatment. Performed at [facility], [address], [place], [place] [address]   MRSA PCR Screening     Status: None   Collection Time: 09/19/20  7:40 AM   Specimen: Nasal Mucosa; Nasopharyngeal  Result Value Ref Range Status   MRSA by PCR NEGATIVE NEGATIVE Final    Comment:        The GeneXpert MRSA Assay (FDA approved for NASAL specimens only), is one component of a comprehensive MRSA colonization surveillance program. It is not intended to diagnose MRSA infection nor to guide or monitor treatment for MRSA infections. Performed at [facility], [address], [place], [place] [address]     Anti-infectives:  Anti-infectives (From admission, onward)   Start     Dose/Rate Route Frequency Ordered Stop   08/19/20 0845  cefTRIAXone (ROCEPHIN) 2 g in sodium chloride 0.9 % 100 mL IVPB  Status:  Discontinued        2 g 200 mL/hr over 30 Minutes Intravenous Every 24 hours 08/19/20 0753 08/25/20 1304   08/17/20 1515  ceFAZolin (ANCEF) IVPB 2g/100 mL premix        2 g 200 mL/hr over 30 Minutes Intravenous  Once 08/17/20 1500 08/17/20 1530    Consults: Treatment Team:  Tia Alert, MD    Studies:    Events:  Subjective:    Overnight Issues:   Objective:  Vital signs for last 24 hours: Temp:  [98.4 F (36.9 C)-98.7 F (37.1 C)] 98.4 F (36.9 C) (05/06 0520) Pulse Rate:  [85-121] 109 (05/06 0757) Resp:  [19-39] 26 (05/06 0757) BP: (109-178)/(77-156) 130/79 (05/06 0700) SpO2:  [74 %-100 %] 100 % (05/06  0757) FiO2 (%):  [40 %] 40 % (05/06 0757) Weight:  [84.9 kg] 84.9 kg (05/06 0500)  Hemodynamic parameters for last 24 hours:    Intake/Output from previous day: 05/05 0701 - 05/06 0700 In: 1480 [NG/GT:1480] Out: 1525 [Urine:1525]  Intake/Output this shift: No intake/output data recorded.  Vent settings for last 24 hours: Vent Mode: PRVC FiO2 (%):  [40 %] 40 % Set Rate:  [18 bmp] 18 bmp Vt Set:  [480 mL] 480 mL PEEP:  [5 cmH20] 5 cmH20 Plateau Pressure:  [14 cmH20-28 cmH20] 28 cmH20  Physical Exam:  General: no respiratory distress Neuro: alert and f/c HEENT/Neck: trach-clean, intact Resp: clear to auscultation bilaterally CVS: RRR GI: soft, NT, PEG OK Extremities: edema 1+  Results for orders placed or performed during the hospital encounter of 08/17/20 (from the past 24 hour(s))  Glucose, capillary     Status: Abnormal   Collection Time: 09/20/20 12:21 PM  Result Value Ref Range   Glucose-Capillary 144 (H) 70 - 99 mg/dL  Glucose, capillary     Status: Abnormal   Collection Time: 09/20/20  3:50 PM  Result Value Ref Range   Glucose-Capillary 108 (H) 70 - 99 mg/dL  Glucose, capillary     Status: Abnormal   Collection Time: 09/20/20  7:33 PM  Result Value Ref Range   Glucose-Capillary 140 (H)  70 - 99 mg/dL  Glucose, capillary     Status: Abnormal   Collection Time: 09/20/20 11:28 PM  Result Value Ref Range   Glucose-Capillary 125 (H) 70 - 99 mg/dL    Assessment & Plan: Present on Admission: . Thoracic spine fracture (HCC)    LOS: 35 days   Additional comments:I reviewed the patient's new clinical lab test results. . Fall from height  R PTX, B effusion Multiple B rib fxs- these are at the junction of the thoracic vertebral bodies, pain control 3 column T8 vertebral body fx-perDr. Jones,non-op now, plan mobilize in brace once off vent. Needs CTO brace for HOB>30 degrees. T7 neural arch fx- per nsgy R TVP T4-8 - pain control, per nsgy R C7 TVP fx  through foramen- C collar per Dr. Yetta Barre, CTAnegative Large scalp laceration/right eyebrow laceration- repair by Dr. Lovick4/05/2020, sutures removed Right neck abrasion/skin tear- bacitracin BID Facial ecchymosis- CT maxillofacialdemonstrates no facial bone fractures. Some anterior subluxation of the right mandibular condyle HTN- home lopressor 25mg  daily CAD/H/O MI/DES- on ASA at home. Hold for now. Monitor, on tele Acute hypercarbic ventilator dependent respiratory failure/COPD-Trach/PEG 4/18by Dr. 03-05-1974. HTC trials, trach changed this AM by Dr. Janee Morn for blown cuff, CXR P Rash on back- benadryl Hypotension- resolved Depression/anxiety- home bupropion whentaking PO, home buspar BPH- home terazosin, repeatedly failedvoiding trials, increased bethanachol 4/15. I&O q6h ABL anemia- stable FEN-TF,bowel regimen VTE-LMWH Dispo- ICU;plan home on vent (if needed), HTC today Guaifenesin and scop patch for secretions Critical Care Total Time*: 37 Minutes  5/15, MD, MPH, FACS Trauma & General Surgery Use [email or web address] to contact on call provider  09/21/2020  *Care during the described time interval was provided by me. I have reviewed this patient's available data, including medical history, events of note, physical examination and test results as part of my evaluation.

## 2020-09-21 NOTE — Progress Notes (Signed)
Patient has been extremely restless and agitated since around 1200. Morphine given at 1300 via R IV w/ no improvement. Ativan given at 1340.

## 2020-09-21 NOTE — Progress Notes (Signed)
RT NOTE:  Trauma MD changed trach due to blown cuff. ETCO2 +. Pt back on vent and tolerating well.

## 2020-09-22 LAB — GLUCOSE, CAPILLARY
Glucose-Capillary: 123 mg/dL — ABNORMAL HIGH (ref 70–99)
Glucose-Capillary: 133 mg/dL — ABNORMAL HIGH (ref 70–99)
Glucose-Capillary: 125 mg/dL — ABNORMAL HIGH (ref 70–99)
Glucose-Capillary: 158 mg/dL — ABNORMAL HIGH (ref 70–99)
Glucose-Capillary: 119 mg/dL — ABNORMAL HIGH (ref 70–99)
Glucose-Capillary: 111 mg/dL — ABNORMAL HIGH (ref 70–99)

## 2020-09-22 NOTE — Progress Notes (Signed)
Pt placed back on full vent support due to increased WOB. RN at bedside. °

## 2020-09-22 NOTE — Progress Notes (Signed)
19 Days Post-Op   Subjective/Chief Complaint: Pt with no acute changes   Objective: Vital signs in last 24 hours: Temp:  [98.6 F (37 C)-100 F (37.8 C)] 98.7 F (37.1 C) (05/07 0400) Pulse Rate:  [87-117] 89 (05/07 0800) Resp:  [14-29] 20 (05/07 0800) BP: (108-144)/(65-98) 144/79 (05/07 0800) SpO2:  [95 %-100 %] 100 % (05/07 0800) FiO2 (%):  [40 %] 40 % (05/07 0800) Weight:  [87.9 kg] 87.9 kg (05/07 0400) Last BM Date: 09/21/20  Intake/Output from previous day: 05/06 0701 - 05/07 0700 In: 2180 [P.O.:200; NG/GT:1980] Out: 1000 [Urine:1000] Intake/Output this shift: No intake/output data recorded. \ Physical Exam:  General: no respiratory distress Neuro: alert and f/c HEENT/Neck: trach-clean, intact Resp: clear to auscultation bilaterally CVS: RRR GI: soft, NT, PEG OK Extremities: edema 1+  Lab Results:  Recent Labs    09/20/20 0148  WBC 19.2*  HGB 9.7*  HCT 31.6*  PLT 564*   BMET Recent Labs    09/20/20 0148  NA 138  K 4.2  CL 99  CO2 29  GLUCOSE 117*  BUN 19  CREATININE 0.51*  CALCIUM 9.2   PT/INR No results for input(s): LABPROT, INR in the last 72 hours. ABG No results for input(s): PHART, HCO3 in the last 72 hours.  Invalid input(s): PCO2, PO2  Studies/Results: DG CHEST PORT 1 VIEW  Result Date: 09/21/2020 CLINICAL DATA:  Tracheostomy exchange EXAM: PORTABLE CHEST 1 VIEW COMPARISON:  09/14/2020 FINDINGS: The heart size and mediastinal contours are within normal limits. Tracheostomy appliance projects in appropriate position. Interval removal of a right chest pigtail chest tube. No pneumothorax. Mild, diffuse interstitial opacity, similar to prior. The visualized skeletal structures are unremarkable. IMPRESSION: 1. Tracheostomy appliance projects in appropriate position. 2. Interval removal of a right chest pigtail chest tube. No pneumothorax. 3. Mild, diffuse interstitial opacity, similar to prior, consistent with edema or infection.  Electronically Signed   By: Lauralyn Primes M.D.   On: 09/21/2020 11:44    Anti-infectives: Anti-infectives (From admission, onward)   Start     Dose/Rate Route Frequency Ordered Stop   08/19/20 0845  cefTRIAXone (ROCEPHIN) 2 g in sodium chloride 0.9 % 100 mL IVPB  Status:  Discontinued        2 g 200 mL/hr over 30 Minutes Intravenous Every 24 hours 08/19/20 0753 08/25/20 1304   08/17/20 1515  ceFAZolin (ANCEF) IVPB 2g/100 mL premix        2 g 200 mL/hr over 30 Minutes Intravenous  Once 08/17/20 1500 08/17/20 1530      Assessment/Plan: Fall from height  R PTX, B effusion Multiple B rib fxs- these are at the junction of the thoracic vertebral bodies, pain control 3 column T8 vertebral body fx-perDr. Jones,non-op now, plan mobilize in brace once off vent. Needs CTO brace for HOB>30 degrees. T7 neural arch fx- per nsgy R TVP T4-8 - pain control, per nsgy R C7 TVP fx through foramen- C collar per Dr. Yetta Barre, CTAnegative Large scalp laceration/right eyebrow laceration- repair by Dr. Lovick4/05/2020, sutures removed Right neck abrasion/skin tear- bacitracin BID Facial ecchymosis- CT maxillofacialdemonstrates no facial bone fractures. Some anterior subluxation of the right mandibular condyle HTN- home lopressor 25mg  daily CAD/H/O MI/DES- on ASA at home. Hold for now. Monitor, on tele Acute hypercarbic ventilator dependent respiratory failure/COPD-Trach/PEG 4/18by Dr. 03-05-1974. HTC trials ongoing Rash on back- benadryl Hypotension- resolved Depression/anxiety- home bupropion whentaking PO, home buspar BPH- home terazosin, repeatedly failedvoiding trials, increased bethanachol 4/15. I&O q6h ABL anemia- stable  FEN-TF,bowel regimen VTE-LMWH Dispo- ICU;plan home on vent (if needed), HTC today Guaifenesin and scop patch for secretions Critical Care Total Time*: 30 Minutes   LOS: 36 days    Axel Filler 09/22/2020

## 2020-09-22 NOTE — Progress Notes (Signed)
RT NOTE: patient placed on CPAP/PSV of 8/5 at 0800.  Currently tolerating well.  Will continue to monitor and wean as tolerated.

## 2020-09-22 NOTE — Plan of Care (Signed)
  Problem: Clinical Measurements: Goal: Respiratory complications will improve Outcome: Progressing Note: Trach collared x 5 hours   Problem: Nutrition: Goal: Adequate nutrition will be maintained Outcome: Progressing   Problem: Elimination: Goal: Will not experience complications related to bowel motility Outcome: Progressing Goal: Will not experience complications related to urinary retention Outcome: Progressing

## 2020-09-22 NOTE — Progress Notes (Signed)
Pt placed on 40% ATC and is tolerating well at this time. RN aware. RT to continue to monitor. 

## 2020-09-22 NOTE — Progress Notes (Signed)
Pt placed on back on PSV 8/5 due to increased WOB. Pt tolerated 5 hours of ATC.

## 2020-09-22 NOTE — TOC Progression Note (Signed)
Transition of Care [facility]) - Progression Note    Patient Details  Name: Charles Weeks MRN: [id number] Date of Birth: 1965/06/22  [facility]) CM/SW Contact  Mearl Latin, LCSW Phone Number: 09/22/2020, 10:03 AM  Clinical Narrative:    CSW received call from [facility] w/[employer] [phone]). She is available for any discharge planning needs.    Expected Discharge Plan: Skilled Nursing Facility Barriers to Discharge: Continued Medical Work up  Expected Discharge Plan and Services Expected Discharge Plan: Skilled Nursing Facility   Discharge Planning Services: CM Consult   Living arrangements for the past 2 months: Single Family Home                                       Social Determinants of Health (SDOH) Interventions    Readmission Risk Interventions No flowsheet data found.

## 2020-09-23 LAB — GLUCOSE, CAPILLARY
Glucose-Capillary: 121 mg/dL — ABNORMAL HIGH (ref 70–99)
Glucose-Capillary: 111 mg/dL — ABNORMAL HIGH (ref 70–99)
Glucose-Capillary: 187 mg/dL — ABNORMAL HIGH (ref 70–99)
Glucose-Capillary: 107 mg/dL — ABNORMAL HIGH (ref 70–99)
Glucose-Capillary: 142 mg/dL — ABNORMAL HIGH (ref 70–99)

## 2020-09-23 NOTE — Plan of Care (Signed)
  Problem: Education: Goal: Knowledge of General Education information will improve Description Including pain rating scale, medication(s)/side effects and non-pharmacologic comfort measures Outcome: Progressing   Problem: Health Behavior/Discharge Planning: Goal: Ability to manage health-related needs will improve Outcome: Progressing   

## 2020-09-23 NOTE — Progress Notes (Signed)
This RN updated pt. Sister [name].This Rn provided an update within his scope of practice. No further questions were asked, this Rn will continue to monitor pt. Closely.

## 2020-09-23 NOTE — Progress Notes (Signed)
20 Days Post-Op   Subjective/Chief Complaint: No acute changes 5hr TC yesteday  Objective: Vital signs in last 24 hours: Temp:  [98.2 F (36.8 C)-99.8 F (37.7 C)] 99.3 F (37.4 C) (05/08 0400) Pulse Rate:  [93-118] 98 (05/08 0703) Resp:  [15-40] 18 (05/08 0703) BP: (115-160)/(58-96) 143/91 (05/08 0702) SpO2:  [88 %-100 %] 100 % (05/08 0703) FiO2 (%):  [40 %] 40 % (05/08 0400) Weight:  [81.8 kg-86.7 kg] 86.7 kg (05/08 0600) Last BM Date: 09/23/20  Intake/Output from previous day: 05/07 0701 - 05/08 0700 In: 1860 [NG/GT:1860] Out: 2025 [Urine:2025] Intake/Output this shift: No intake/output data recorded.  Physical Exam: General:no respiratory distress Neuro:alert andf/c HEENT/Neck:trach-clean, intact Resp:clear to auscultation bilaterally CVS:RRR TK:ZSWF, NT, PEG OK Extremities:edema 1+   Studies/Results: DG CHEST PORT 1 VIEW  Result Date: 09/21/2020 CLINICAL DATA:  Tracheostomy exchange EXAM: PORTABLE CHEST 1 VIEW COMPARISON:  09/14/2020 FINDINGS: The heart size and mediastinal contours are within normal limits. Tracheostomy appliance projects in appropriate position. Interval removal of a right chest pigtail chest tube. No pneumothorax. Mild, diffuse interstitial opacity, similar to prior. The visualized skeletal structures are unremarkable. IMPRESSION: 1. Tracheostomy appliance projects in appropriate position. 2. Interval removal of a right chest pigtail chest tube. No pneumothorax. 3. Mild, diffuse interstitial opacity, similar to prior, consistent with edema or infection. Electronically Signed   By: Lauralyn Primes M.D.   On: 09/21/2020 11:44    Anti-infectives: Anti-infectives (From admission, onward)   Start     Dose/Rate Route Frequency Ordered Stop   08/19/20 0845  cefTRIAXone (ROCEPHIN) 2 g in sodium chloride 0.9 % 100 mL IVPB  Status:  Discontinued        2 g 200 mL/hr over 30 Minutes Intravenous Every 24 hours 08/19/20 0753 08/25/20 1304   08/17/20  1515  ceFAZolin (ANCEF) IVPB 2g/100 mL premix        2 g 200 mL/hr over 30 Minutes Intravenous  Once 08/17/20 1500 08/17/20 1530      Assessment/Plan: Fall from height  R PTX, B effusion Multiple B rib fxs- these are at the junction of the thoracic vertebral bodies, pain control 3 column T8 vertebral body fx-perDr. Jones,non-op now, plan mobilize in brace once off vent. Needs CTO brace for HOB>30 degrees. T7 neural arch fx- per nsgy R TVP T4-8 - pain control, per nsgy R C7 TVP fx through foramen- C collar per Dr. Yetta Barre, CTAnegative Large scalp laceration/right eyebrow laceration- repair by Dr. Lovick4/05/2020, sutures removed Right neck abrasion/skin tear- bacitracin BID Facial ecchymosis- CT maxillofacialdemonstrates no facial bone fractures. Some anterior subluxation of the right mandibular condyle HTN- home lopressor 25mg  daily CAD/H/O MI/DES- on ASA at home. Hold for now. Monitor, on tele Acute hypercarbic ventilator dependent respiratory failure/COPD-Trach/PEG 4/18by Dr. 03-05-1974. HTC trials ongoing Rash on back- benadryl Hypotension- resolved Depression/anxiety- home bupropion whentaking PO, home buspar BPH- home terazosin, repeatedly failedvoiding trials, increased bethanachol 4/15. I&O q6h ABL anemia- stable FEN-TF,bowel regimen VTE-LMWH Dispo- ICU;plan home on vent (if needed), HTC again today Guaifenesin and scop patch for secretions Critical Care Total Time*:30Minutes  LOS: 37 days    5/15 09/23/2020

## 2020-09-23 NOTE — Plan of Care (Signed)
Patient wean to trach collar today, remains on tube feeds at goal and tolerating them.  In and out cath q6hrs, pt able to nod head appropriately.

## 2020-09-23 NOTE — Progress Notes (Signed)
Pt placed on full vent support due to increased WOB and pt stating his breathing felt tired. Pt tolerated 7 hours of ATC.

## 2020-09-23 NOTE — Progress Notes (Signed)
Pt placed on 40% ATC per wean protocol. Pt is tolerating well at this time. RT to continue to monitor.

## 2020-09-24 ENCOUNTER — Inpatient Hospital Stay (HOSPITAL_COMMUNITY): Payer: Medicaid Other

## 2020-09-24 LAB — COMPREHENSIVE METABOLIC PANEL
ALT: 15 U/L (ref 0–44)
AST: 23 U/L (ref 15–41)
Albumin: 3.1 g/dL — ABNORMAL LOW (ref 3.5–5.0)
Alkaline Phosphatase: 87 U/L (ref 38–126)
Anion gap: 7 (ref 5–15)
BUN: 13 mg/dL (ref 6–20)
CO2: 34 mmol/L — ABNORMAL HIGH (ref 22–32)
Calcium: 9.4 mg/dL (ref 8.9–10.3)
Chloride: 96 mmol/L — ABNORMAL LOW (ref 98–111)
Creatinine, Ser: 0.55 mg/dL — ABNORMAL LOW (ref 0.61–1.24)
GFR, Estimated: >60 mL/min (ref >60)
Glucose, Bld: 134 mg/dL — ABNORMAL HIGH (ref 70–99)
Potassium: 3.7 mmol/L (ref 3.5–5.1)
Sodium: 137 mmol/L (ref 135–145)
Total Bilirubin: 0.7 mg/dL (ref 0.3–1.2)
Total Protein: 6.6 g/dL (ref 6.5–8.1)

## 2020-09-24 LAB — CBC
HCT: 32.3 % — ABNORMAL LOW (ref 39.0–52.0)
Hemoglobin: 10 g/dL — ABNORMAL LOW (ref 13.0–17.0)
MCH: 28.7 pg (ref 26.0–34.0)
MCHC: 31 g/dL (ref 30.0–36.0)
MCV: 92.8 fL (ref 80.0–100.0)
Platelets: 508 10*3/uL — ABNORMAL HIGH (ref 150–400)
RBC: 3.48 MIL/uL — ABNORMAL LOW (ref 4.22–5.81)
RDW: 17 % — ABNORMAL HIGH (ref 11.5–15.5)
WBC: 14.8 10*3/uL — ABNORMAL HIGH (ref 4.0–10.5)
nRBC: 0 % (ref 0.0–0.2)

## 2020-09-24 LAB — GLUCOSE, CAPILLARY
Glucose-Capillary: 108 mg/dL — ABNORMAL HIGH (ref 70–99)
Glucose-Capillary: 109 mg/dL — ABNORMAL HIGH (ref 70–99)
Glucose-Capillary: 130 mg/dL — ABNORMAL HIGH (ref 70–99)
Glucose-Capillary: 131 mg/dL — ABNORMAL HIGH (ref 70–99)
Glucose-Capillary: 132 mg/dL — ABNORMAL HIGH (ref 70–99)
Glucose-Capillary: 144 mg/dL — ABNORMAL HIGH (ref 70–99)

## 2020-09-24 MED ORDER — DOCUSATE SODIUM 50 MG/5ML PO LIQD
100.0000 mg | Freq: Every day | ORAL | Status: DC
  Administered 2020-09-25 – 2020-10-09 (×15): 100 mg
  Filled 2020-09-24 (×16): qty 10

## 2020-09-24 MED ORDER — QUETIAPINE FUMARATE 25 MG PO TABS
150.0000 mg | ORAL_TABLET | Freq: Two times a day (BID) | ORAL | Status: DC
  Administered 2020-09-24 – 2020-09-25 (×3): 150 mg via ORAL
  Filled 2020-09-24 (×3): qty 2

## 2020-09-24 MED ORDER — GUAIFENESIN-DM 100-10 MG/5ML PO SYRP
15.0000 mL | ORAL_SOLUTION | ORAL | Status: DC
  Administered 2020-09-24 – 2020-10-09 (×91): 15 mL
  Filled 2020-09-24 (×95): qty 15

## 2020-09-24 MED ORDER — QUETIAPINE FUMARATE 200 MG PO TABS
300.0000 mg | ORAL_TABLET | Freq: Every day | ORAL | Status: DC
  Administered 2020-09-24: 300 mg via ORAL
  Filled 2020-09-24: qty 1

## 2020-09-24 NOTE — Progress Notes (Signed)
Physical Therapy Treatment Patient Details Name: Charles Weeks MRN: [id number] DOB: 09-30-1965 Today's Date: 09/24/2020    History of Present Illness 55 y.o. male who presented 4/1 after falling down about 11 stairs and sustained R PTX, anterior subluxation of R mandibular condyle, multiple bil ribs fxs at junction of thoracic vertebral bodies T5-8 on R and T7-8 on L, 3 column T8 vertebral body fx, T7 neural arch fx, R TVP T4-8 fxs, and R C7 TVP fx through foramen. R chest tube inserted 4/1. Pt found to be hypoxic and was intubated 4/2. No evidence of vascular injury to neck, facial fx, acute intracranial injury, lumbar spine fx, R elbow acute abnormality, or pelvic fx identified on imaging. S/p trach and PEG placement 4/18. S/p L chest tube placement 4/19. PMH: HTN, COPD, CAD, s/p MI with DES RCA in Jan 2020 just on ASA, depression/anxiety, and BPH.    PT Comments    Focus on bed mobility, transfers, and pre gait training. Pt with no agitation or restlessness this session; following 1 step commands with increased time. Requiring two person mod-max assist for bed mobility and two person minimal assist for transfers to standing and taking side steps at edge of bed. Pt requiring x 3 deep suctions by RN during session; increased RR to 40's and up to 52 at one point. Verbal cueing for slower breathing and relaxing. Assisted RN with gown/linen change, foam dressing change, and bathing via rolling to right and left with focus on trunk/core activation. Will continue to progress mobility as tolerated.     Follow Up Recommendations  Supervision/Assistance - 24 hour;SNF;LTACH     Equipment Recommendations  Wheelchair (measurements PT);Wheelchair cushion (measurements PT);Hospital bed;Rolling walker with 5" wheels;3in1 (PT)    Recommendations for Other Services       Precautions / Restrictions Precautions Precautions: Cervical;Back Precaution Booklet Issued: No Precaution Comments: trach on vent,  bilateral wrist restraints and mittens, TLSO donned with rolling if HOB > 30 deg, cervical collar at all times, abdominal binder to protect PEG Required Braces or Orthoses: Cervical Brace;Spinal Brace Cervical Brace: Hard collar;At all times Spinal Brace: Thoracolumbosacral orthotic;Applied in supine position Restrictions Weight Bearing Restrictions: No    Mobility  Bed Mobility Overal bed mobility: Needs Assistance Bed Mobility: Rolling;Sidelying to Sit;Sit to Sidelying Rolling: Mod assist;+2 for safety/equipment Sidelying to sit: Max assist;+2 for physical assistance     Sit to sidelying: Mod assist;+2 for physical assistance General bed mobility comments: ModA (+2 safety) to roll several times throughout session for bathing, linen/gown change, and donning brace. MaxA + 2 to progress to sitting position, heavy trunk assist to upright from HOB flat, modA + 2 to return to supine with trunk and BLE guidance back into bed    Transfers Overall transfer level: Needs assistance Equipment used: 2 person hand held assist Transfers: Sit to/from Stand Sit to Stand: +2 physical assistance;Min assist         General transfer comment: MinA + 2 to rise from edge of bed and take side steps x 2, difficulty initiating steps, bilateral knee instability  Ambulation/Gait                 Stairs             Wheelchair Mobility    Modified Rankin (Stroke Patients Only)       Balance Overall balance assessment: Needs assistance Sitting-balance support: Bilateral upper extremity supported;Feet supported Sitting balance-Leahy Scale: Poor Sitting balance - Comments: Supervision for safety, BUE support,  one minor posterior LOB   Standing balance support: Bilateral upper extremity supported;During functional activity Standing balance-Leahy Scale: Poor Standing balance comment: reliant on HHA                            Cognition Arousal/Alertness:  Awake/alert Behavior During Therapy: Flat affect Overall Cognitive Status: Impaired/Different from baseline Area of Impairment: Attention;Following commands;Safety/judgement;Rancho level;Awareness;Memory;Problem solving               Rancho Levels of Cognitive Functioning Rancho Los Amigos Scales of Cognitive Functioning: Confused/agitated   Current Attention Level: Selective Memory: Decreased short-term memory;Decreased recall of precautions Following Commands: Follows one step commands with increased time;Follows one step commands inconsistently Safety/Judgement: Decreased awareness of safety;Decreased awareness of deficits Awareness: Intellectual Problem Solving: Slow processing;Decreased initiation;Requires verbal cues;Requires tactile cues;Difficulty sequencing General Comments: Pt with no agitation or impulsitivity this session, following > 75% of 1 step commands, benefits from increased time. Flat affect, mouthing occasional responses to questions      Exercises General Exercises - Lower Extremity Long Arc Quad: Both;10 reps;Seated    General Comments        Pertinent Vitals/Pain Pain Assessment: Faces Faces Pain Scale: Hurts even more Pain Location: lower back Pain Descriptors / Indicators: Grimacing;Guarding;Discomfort Pain Intervention(s): Monitored during session;Other (comment) (RN providing pain medication post session)    Home Living                      Prior Function            PT Goals (current goals can now be found in the care plan section) Acute Rehab PT Goals Patient Stated Goal: did not state PT Goal Formulation: Patient unable to participate in goal setting Time For Goal Achievement: 10/03/20 Potential to Achieve Goals: Fair Progress towards PT goals: Progressing toward goals    Frequency    Min 3X/week      PT Plan Current plan remains appropriate    Co-evaluation              AM-PAC PT "6 Clicks" Mobility    Outcome Measure  Help needed turning from your back to your side while in a flat bed without using bedrails?: A Lot Help needed moving from lying on your back to sitting on the side of a flat bed without using bedrails?: A Lot Help needed moving to and from a bed to a chair (including a wheelchair)?: A Lot Help needed standing up from a chair using your arms (e.g., wheelchair or bedside chair)?: A Little Help needed to walk in hospital room?: A Lot Help needed climbing 3-5 steps with a railing? : Total 6 Click Score: 12    End of Session Equipment Utilized During Treatment: Gait belt;Back brace;Cervical collar;Oxygen Activity Tolerance: Patient tolerated treatment well Patient left: in bed;with call bell/phone within reach;with bed alarm set;with nursing/sitter in room;with restraints reapplied (bed in chair-like position) Nurse Communication: Mobility status PT Visit Diagnosis: Muscle weakness (generalized) (M62.81);Difficulty in walking, not elsewhere classified (R26.2);Unsteadiness on feet (R26.81);Other abnormalities of gait and mobility (R26.89)     Time: 9163-8466 PT Time Calculation (min) (ACUTE ONLY): 53 min  Charges:  $Therapeutic Activity: 53-67 mins                     Lillia Pauls, PT, DPT Acute Rehabilitation Services Pager [phone] Office [phone]    Norval Morton 09/24/2020, 10:18 AM

## 2020-09-24 NOTE — Progress Notes (Signed)
Patient placed on 10L/40% ATC. Tolerating well at this time. RT will continue to monitor.

## 2020-09-24 NOTE — Progress Notes (Signed)
Trauma/Critical Care Follow Up Note  Subjective:    Overnight Issues: PSV x7h yest  Objective:  Vital signs for last 24 hours: Temp:  [97.9 F (36.6 C)-99.7 F (37.6 C)] 99.1 F (37.3 C) (05/09 0800) Pulse Rate:  [90-114] 103 (05/09 0800) Resp:  [15-34] 18 (05/09 0800) BP: (105-148)/(66-111) 122/85 (05/09 0800) SpO2:  [90 %-100 %] 99 % (05/09 0800) FiO2 (%):  [40 %] 40 % (05/09 0321)  Hemodynamic parameters for last 24 hours:    Intake/Output from previous day: 05/08 0701 - 05/09 0700 In: 3330 [I.V.:10; NG/GT:3040] Out: 1290 [Urine:1290]  Intake/Output this shift: No intake/output data recorded.  Vent settings for last 24 hours: Vent Mode: PRVC FiO2 (%):  [40 %] 40 % Set Rate:  [18 bmp] 18 bmp Vt Set:  [480 mL] 480 mL PEEP:  [5 cmH20] 5 cmH20 Plateau Pressure:  [15 cmH20-26 cmH20] 15 cmH20  Physical Exam:  Neuro: non-focal exam, agitated HEENT: PERRL Neck: supple CV: RRR Pulm: unlabored breathing on PSV Abd: soft, NT GU: clear yellow urine Extr: wwp, no edema   Results for orders placed or performed during the hospital encounter of 08/17/20 (from the past 24 hour(s))  Glucose, capillary     Status: Abnormal   Collection Time: 09/23/20 12:09 PM  Result Value Ref Range   Glucose-Capillary 187 (H) 70 - 99 mg/dL  Glucose, capillary     Status: Abnormal   Collection Time: 09/23/20  4:05 PM  Result Value Ref Range   Glucose-Capillary 107 (H) 70 - 99 mg/dL  Glucose, capillary     Status: Abnormal   Collection Time: 09/23/20  7:42 PM  Result Value Ref Range   Glucose-Capillary 142 (H) 70 - 99 mg/dL  Glucose, capillary     Status: Abnormal   Collection Time: 09/24/20 12:06 AM  Result Value Ref Range   Glucose-Capillary 131 (H) 70 - 99 mg/dL  Glucose, capillary     Status: Abnormal   Collection Time: 09/24/20  3:13 AM  Result Value Ref Range   Glucose-Capillary 108 (H) 70 - 99 mg/dL  Glucose, capillary     Status: Abnormal   Collection Time: 09/24/20   8:14 AM  Result Value Ref Range   Glucose-Capillary 130 (H) 70 - 99 mg/dL    Assessment & Plan: The plan of care was discussed with the bedside nurse for the day, who is in agreement with this plan and no additional concerns were raised.   Present on Admission: . Thoracic spine fracture (HCC)    LOS: 38 days   Additional comments:I reviewed the patient's new clinical lab test results.   and I reviewed the patients new imaging test results.    Fall from height  R PTX, B effusion Multiple B rib fxs- these are at the junction of the thoracic vertebral bodies, pain control 3 column T8 vertebral body fx-perDr. Jones,non-op now, plan mobilize in brace once off vent. Needs CTO brace for HOB>30 degrees. T7 neural arch fx- per nsgy R TVP T4-8 - pain control, per nsgy R C7 TVP fx through foramen- C collar per Dr. Yetta Barre, CTAnegative Large scalp laceration/right eyebrow laceration- repair by Dr. Lovick4/05/2020, sutures removed Right neck abrasion/skin tear- bacitracin BID Facial ecchymosis- CT maxillofacialdemonstrates no facial bone fractures. Some anterior subluxation of the right mandibular condyle HTN- home lopressor 25mg  daily CAD/H/O MI/DES- on ASA at home. Hold for now. Monitor, on tele Acute hypercarbic ventilator dependent respiratory failure/COPD-Trach/PEG 4/18by Dr. 03-05-1974. HTC trialsongoing Rash on back- benadryl Hypotension-  resolved Depression/anxiety- home bupropion whentaking PO, home buspar BPH- home terazosin, repeatedly failedvoiding trials, increased bethanachol 4/15. I&O q6h ABL anemia- stable FEN-TF,bowel regimen VTE-LMWH Dispo- ICU;plan home on vent (if needed), HTC again today  Critical Care Total Time: 45 minutes  Diamantina Monks, MD Trauma & General Surgery Please use [email or web address] to contact on call provider  09/24/2020  *Care during the described time interval was provided by me. I have reviewed this patient's  available data, including medical history, events of note, physical examination and test results as part of my evaluation.

## 2020-09-24 NOTE — Progress Notes (Signed)
Pt attempting to get out of bed x3. Informed MD Lovick- Order obtained for soft posey belt restraint.

## 2020-09-25 LAB — GLUCOSE, CAPILLARY
Glucose-Capillary: 125 mg/dL — ABNORMAL HIGH (ref 70–99)
Glucose-Capillary: 132 mg/dL — ABNORMAL HIGH (ref 70–99)
Glucose-Capillary: 136 mg/dL — ABNORMAL HIGH (ref 70–99)
Glucose-Capillary: 140 mg/dL — ABNORMAL HIGH (ref 70–99)
Glucose-Capillary: 146 mg/dL — ABNORMAL HIGH (ref 70–99)
Glucose-Capillary: 151 mg/dL — ABNORMAL HIGH (ref 70–99)
Glucose-Capillary: 185 mg/dL — ABNORMAL HIGH (ref 70–99)

## 2020-09-25 MED ORDER — QUETIAPINE FUMARATE 200 MG PO TABS
300.0000 mg | ORAL_TABLET | Freq: Every day | ORAL | Status: DC
  Administered 2020-09-25 – 2020-10-08 (×14): 300 mg
  Filled 2020-09-25 (×15): qty 1

## 2020-09-25 MED ORDER — ASPIRIN 81 MG PO CHEW
81.0000 mg | CHEWABLE_TABLET | Freq: Every day | ORAL | Status: DC
  Administered 2020-09-25 – 2020-10-09 (×15): 81 mg
  Filled 2020-09-25 (×15): qty 1

## 2020-09-25 MED ORDER — QUETIAPINE FUMARATE 25 MG PO TABS
150.0000 mg | ORAL_TABLET | Freq: Two times a day (BID) | ORAL | Status: DC
  Administered 2020-09-25 – 2020-10-09 (×28): 150 mg
  Filled 2020-09-25 (×28): qty 2

## 2020-09-25 NOTE — Progress Notes (Signed)
Patient ID: Charles Weeks, male   DOB: 1966/02/20, 55 y.o.   MRN: [id number] Follow up - Trauma Critical Care  Patient Details:    Charles Weeks is an 55 y.o. male.  Lines/tubes : Gastrostomy/Enterostomy Percutaneous endoscopic gastrostomy (PEG) 20 Fr. LUQ (Active)  Surrounding Skin Dry;Intact 09/25/20 0800  Tube Status Patent 09/25/20 0800  Drainage Appearance None 09/24/20 2000  Dressing Status Clean;Dry;Intact 09/25/20 0800  Dressing Intervention Dressing reinforced 09/24/20 2000  Dressing Type Abdominal Binder 09/25/20 0800  Dressing Change Due 09/25/20 09/24/20 2000  G Port Intake (mL) 100 ml 09/23/20 1600  Output (mL) 0 mL 09/23/20 1000     External Urinary Catheter (Active)  Collection Container Standard drainage bag 09/25/20 0800  Suction (Verified suction is between 40-80 mmHg) N/A (Patient has condom catheter) 09/25/20 0800  Securement Method Securing device (Describe) 09/25/20 0800  Site Assessment Clean;Intact 09/25/20 0800  Intervention Equipment Changed 09/25/20 0900  Output (mL) 300 mL 09/25/20 0400    Microbiology/Sepsis markers: Results for orders placed or performed during the hospital encounter of 08/17/20  Resp Panel by RT-PCR (Flu A&B, Covid) Nasopharyngeal Swab     Status: None   Collection Time: 08/17/20  3:40 PM   Specimen: Nasopharyngeal Swab; Nasopharyngeal(NP) swabs in vial transport medium  Result Value Ref Range Status   SARS Coronavirus 2 by RT PCR NEGATIVE NEGATIVE Final    Comment: (NOTE) SARS-CoV-2 target nucleic acids are NOT DETECTED.  The SARS-CoV-2 RNA is generally detectable in upper respiratory specimens during the acute phase of infection. The lowest concentration of SARS-CoV-2 viral copies this assay can detect is 138 copies/mL. A negative result does not preclude SARS-Cov-2 infection and should not be used as the sole basis for treatment or other patient management decisions. A negative result may occur with  improper specimen  collection/handling, submission of specimen other than nasopharyngeal swab, presence of viral mutation(s) within the areas targeted by this assay, and inadequate number of viral copies(<138 copies/mL). A negative result must be combined with clinical observations, patient history, and epidemiological information. The expected result is Negative.  Fact Sheet for Patients:  BloggerCourse.com  Fact Sheet for Healthcare Providers:  SeriousBroker.it  This test is no t yet approved or cleared by the [place] FDA and  has been authorized for detection and/or diagnosis of SARS-CoV-2 by FDA under an Emergency Use Authorization (EUA). This EUA will remain  in effect (meaning this test can be used) for the duration of the COVID-19 declaration under Section 564(b)(1) of the Act, 21 U.S.C.section 360bbb-3(b)(1), unless the authorization is terminated  or revoked sooner.       Influenza A by PCR NEGATIVE NEGATIVE Final   Influenza B by PCR NEGATIVE NEGATIVE Final    Comment: (NOTE) The Xpert Xpress SARS-CoV-2/FLU/RSV plus assay is intended as an aid in the diagnosis of influenza from Nasopharyngeal swab specimens and should not be used as a sole basis for treatment. Nasal washings and aspirates are unacceptable for Xpert Xpress SARS-CoV-2/FLU/RSV testing.  Fact Sheet for Patients: BloggerCourse.com  Fact Sheet for Healthcare Providers: SeriousBroker.it  This test is not yet approved or cleared by the [place] FDA and has been authorized for detection and/or diagnosis of SARS-CoV-2 by FDA under an Emergency Use Authorization (EUA). This EUA will remain in effect (meaning this test can be used) for the duration of the COVID-19 declaration under Section 564(b)(1) of the Act, 21 U.S.C. section 360bbb-3(b)(1), unless the authorization is terminated or revoked.  Performed at [facility], [address], [place], [place] [address]   Surgical PCR screen     Status: None   Collection Time: 08/19/20  7:38 AM   Specimen: Nasal Mucosa; Nasal Swab  Result Value Ref Range Status   MRSA, PCR NEGATIVE NEGATIVE Final   Staphylococcus aureus NEGATIVE NEGATIVE Final    Comment: (NOTE) The Xpert SA Assay (FDA approved for NASAL specimens in patients 16 years of age and older), is one component of a comprehensive surveillance program. It is not intended to diagnose infection nor to guide or monitor treatment. Performed at [facility], [address], [place], [place] [address]   MRSA PCR Screening     Status: None   Collection Time: 09/19/20  7:40 AM   Specimen: Nasal Mucosa; Nasopharyngeal  Result Value Ref Range Status   MRSA by PCR NEGATIVE NEGATIVE Final    Comment:        The GeneXpert MRSA Assay (FDA approved for NASAL specimens only), is one component of a comprehensive MRSA colonization surveillance program. It is not intended to diagnose MRSA infection nor to guide or monitor treatment for MRSA infections. Performed at [facility], [address], [place], [place] [address]     Anti-infectives:  Anti-infectives (From admission, onward)   Start     Dose/Rate Route Frequency Ordered Stop   08/19/20 0845  cefTRIAXone (ROCEPHIN) 2 g in sodium chloride 0.9 % 100 mL IVPB  Status:  Discontinued        2 g 200 mL/hr over 30 Minutes Intravenous Every 24 hours 08/19/20 0753 08/25/20 1304   08/17/20 1515  ceFAZolin (ANCEF) IVPB 2g/100 mL premix        2 g 200 mL/hr over 30 Minutes Intravenous  Once 08/17/20 1500 08/17/20 1530    Consults: Treatment Team:  Tia Alert, MD    Studies:    Events:  Subjective:    Overnight Issues:   Objective:  Vital signs for last 24 hours: Temp:  [97.5 F (36.4 C)-98.7 F (37.1 C)] 98.4 F (36.9 C) (05/10 0800) Pulse Rate:  [86-197] 117 (05/10 0900) Resp:  [14-31] 28 (05/10 0900) BP:  (106-153)/(65-103) 127/89 (05/10 0900) SpO2:  [83 %-99 %] 84 % (05/10 0900) FiO2 (%):  [40 %] 40 % (05/10 0835) Weight:  [83.5 kg] 83.5 kg (05/10 0402)  Hemodynamic parameters for last 24 hours:    Intake/Output from previous day: 05/09 0701 - 05/10 0700 In: 2840 [NG/GT:2840] Out: 1215 [Urine:1215]  Intake/Output this shift: Total I/O In: 320 [NG/GT:320] Out: -   Vent settings for last 24 hours: Vent Mode: CPAP;PSV FiO2 (%):  [40 %] 40 % Set Rate:  [18 bmp] 18 bmp Vt Set:  [480 mL] 480 mL PEEP:  [5 cmH20] 5 cmH20 Pressure Support:  [8 cmH20] 8 cmH20 Plateau Pressure:  [14 cmH20-19 cmH20] 19 cmH20  Physical Exam:  General: awake on HTC Neuro: alert, F/C HEENT/Neck: trach-clean, intact Resp: clear to auscultation bilaterally CVS: RRR GI: soft, NT, ND, PEG Extremities: less edema  Results for orders placed or performed during the hospital encounter of 08/17/20 (from the past 24 hour(s))  Glucose, capillary     Status: Abnormal   Collection Time: 09/24/20 12:08 PM  Result Value Ref Range   Glucose-Capillary 144 (H) 70 - 99 mg/dL  Glucose, capillary     Status: Abnormal   Collection Time: 09/24/20  4:05 PM  Result Value Ref Range   Glucose-Capillary 109 (H) 70 - 99 mg/dL  Glucose, capillary     Status: Abnormal   Collection Time: 09/24/20  7:53 PM  Result Value Ref Range   Glucose-Capillary 132 (H) 70 - 99 mg/dL  Glucose, capillary     Status: Abnormal   Collection Time: 09/24/20 11:58 PM  Result Value Ref Range   Glucose-Capillary 125 (H) 70 - 99 mg/dL  Glucose, capillary     Status: Abnormal   Collection Time: 09/25/20  3:51 AM  Result Value Ref Range   Glucose-Capillary 185 (H) 70 - 99 mg/dL  Glucose, capillary     Status: Abnormal   Collection Time: 09/25/20  8:21 AM  Result Value Ref Range   Glucose-Capillary 146 (H) 70 - 99 mg/dL    Assessment & Plan: Present on Admission: . Thoracic spine fracture (HCC)    LOS: 39 days   Additional comments:I  reviewed the patient's new clinical lab test results. . Fall from height  R PTX, B effusion Multiple B rib fxs- these are at the junction of the thoracic vertebral bodies, pain control 3 column T8 vertebral body fx-perDr. Jones,non-op now, plan mobilize in brace once off vent. Needs CTO brace for HOB>30 degrees. T7 neural arch fx- per nsgy R TVP T4-8 - pain control, per nsgy R C7 TVP fx through foramen- C collar per Dr. Yetta Barre, CTAnegative Large scalp laceration/right eyebrow laceration- repair by Dr. Lovick4/05/2020, sutures removed Right neck abrasion/skin tear- bacitracin BID Facial ecchymosis- CT maxillofacialdemonstrates no facial bone fractures. Some anterior subluxation of the right mandibular condyle HTN- home lopressor 25mg  daily CAD/H/O MI/DES- on ASA at home. Hold for now. Monitor, on tele Acute hypercarbic ventilator dependent respiratory failure/COPD-Trach/PEG 4/18by Dr. 03-05-1974. HTC trialsgoing well Rash on back- benadryl Hypotension- resolved Depression/anxiety- home bupropion whentaking PO, home buspar BPH- home terazosin, repeatedly failedvoiding trials, increased bethanachol 4/15. I&O q6h ABL anemia- stable FEN-TF,bowel regimen, seroquel adjustment helped a lot VTE-LMWH Dispo- ICU;try HTC 24/7 Sister still plans to take him home. Will check with TOC to see status of the [facility] services his Medicaid covers. Critical Care Total Time*: 33 Minutes  [facility], MD, MPH, FACS Trauma & General Surgery Use [email or web address] to contact on call provider  09/25/2020  *Care during the described time interval was provided by me. I have reviewed this patient's available data, including medical history, events of note, physical examination and test results as part of my evaluation.

## 2020-09-25 NOTE — TOC Progression Note (Signed)
Transition of Care [facility]) - Progression Note    Patient Details  Name: Charles Weeks MRN: [id number] Date of Birth: Nov 12, 1965  [facility] CM/SW Contact  Mearl Latin, LCSW Phone Number: 09/25/2020, 4:35 PM  Clinical Narrative:    CSW spoke with patient's sister, [name] at bedside, regarding discharge plan. She states that she will not take patient home with her since she is not considered "next of kin" and is not even able to get information from the medical team. She directed CSW to patient's daughters for discharge planning.   CSW spoke with patient's eldest daughter, [name]. She stated she does not feel patient is safe with [name] but also does not feel comfortable making the decisions. She requested CSW contact her sister, [name].   CSW spoke with [name]. She stated that she was fine with patient going home with [name] but that now [name] has backtracked and is not wanting to care for him due to the family dynamics. She stated she would prefer to take him home with her but she does not have the space to care for him. She is requesting SNF at this time. CSW will follow for trach weaning progression and send out referrals based on that.    Expected Discharge Plan: Skilled Nursing Facility Barriers to Discharge: Family Issues,Continued Medical Work up,Insurance Authorization,No SNF bed  Expected Discharge Plan and Services Expected Discharge Plan: Skilled Nursing Facility In-house Referral: Clinical Social Work Discharge Planning Services: CM Consult Post Acute Care Choice: Skilled Nursing Facility Living arrangements for the past 2 months: Single Family Home                                       Social Determinants of Health (SDOH) Interventions    Readmission Risk Interventions No flowsheet data found.

## 2020-09-25 NOTE — Progress Notes (Signed)
Pt. Placed back on vent for rest. Pt. Continued to desat while asleep and breathing was a little labored. Pt.resting fine at this time.

## 2020-09-25 NOTE — Progress Notes (Signed)
  Speech Language Pathology Treatment: [name] Speaking valve  Patient Details Name: Charles Weeks MRN: [id number] DOB: 03/08/1966 Today's Date: 09/25/2020 Time: 3716-9678 SLP Time Calculation (min) (ACUTE ONLY): 18 min  Assessment / Plan / Recommendation Clinical Impression  Charles Weeks is steadily making good gains in regards to speech using PMV and cognition. On trach collar and cuff slowly deflated without concerns; valve donned and remained in place and pushed from hub once initially. Vocal quality is steadily getting stronger spontaneously and he is stimulable when cued 60% of the time. RR, HR and SpO2 were in normal range. Complete exhalations without entrapment behind valve. Respiratory support is minimally decreased and impacted by positioning and possible pain/decreased ROM with back fractures.   Time on trach collar has increased and goal, per RT is TC day and tonight. Mentation and alertness have improved, therefore, recommend instrumental swallow assessment with FEES tomorrow. Will discuss with trauma.      HPI HPI: Pt is a 55 y.o. male who presented 4/1 after falling down about 11 stairs (sister reported fell over a half wall from second floor) and sustained R PTX, anterior subluxation of R mandibular condyle, multiple bil ribs fxs at junction of thoracic vertebral bodies T5-8 on R and T7-8 on L, 3 column T8 vertebral body fx, T7 neural arch fx, R TVP T4-8 fxs, and R C7 TVP fx through foramen. R chest tube 4/1. Pt found to be hypoxic and was intubated 4/2. Received trach and PEG placement 4/18. S/p L chest tube placement 4/19. PMH: HTN, COPD, CAD, s/p MI with DES RCA in Jan 2020 just on ASA, depression/anxiety, and BPH.      SLP Plan  Continue with current plan of care       Recommendations         Patient may use Passy-Muir Speech Valve: During all waking hours (remove during sleep) PMSV Supervision: Intermittent         Oral Care Recommendations: Oral care QID Follow up  Recommendations: Skilled Nursing facility;24 hour supervision/assistance SLP Visit Diagnosis: Aphonia (R49.1) Plan: Continue with current plan of care                       [name] 09/25/2020, 1:44 PM  [name].Ed Nurse, children's [phone] Office [phone]

## 2020-09-25 NOTE — Progress Notes (Signed)
Occupational Therapy Treatment Patient Details Name: Charles Weeks MRN: [id number] DOB: Sep 21, 1965 Today's Date: 09/25/2020    History of present illness 55 y.o. male who presented 4/1 after falling down about 11 stairs and sustained R PTX, anterior subluxation of R mandibular condyle, multiple bil ribs fxs at junction of thoracic vertebral bodies T5-8 on R and T7-8 on L, 3 column T8 vertebral body fx, T7 neural arch fx, R TVP T4-8 fxs, and R C7 TVP fx through foramen. R chest tube inserted 4/1. Pt found to be hypoxic and was intubated 4/2. No evidence of vascular injury to neck, facial fx, acute intracranial injury, lumbar spine fx, R elbow acute abnormality, or pelvic fx identified on imaging. S/p trach and PEG placement 4/18. S/p L chest tube placement 4/19. PMH: HTN, COPD, CAD, s/p MI with DES RCA in Jan 2020 just on ASA, depression/anxiety, and BPH.   OT comments  Pt on trach collar at this time incontinence of stool and lack of awareness. Pt requires total (A) for all adls. Pt sitting EOB min to min guard in brace and tolerates EOB transfer. Pt does not try to speak at any point during session or mouth. Pt with waist restraint and bil Mittens. Recommendation for SNF at this time.    Follow Up Recommendations  LTACH;SNF    Equipment Recommendations  Wheelchair (measurements OT);Wheelchair cushion (measurements OT);Hospital bed    Recommendations for Other Services      Precautions / Restrictions Precautions Precautions: Cervical;Back Precaution Comments: trach collar, bil wrist restraints, condom cath, TLSO done with rolling if hOB >30 , ccollar at all times, abdominal binder to protect peg Required Braces or Orthoses: Cervical Brace;Spinal Brace Cervical Brace: Hard collar;At all times Spinal Brace: Thoracolumbosacral orthotic;Applied in supine position Restrictions Weight Bearing Restrictions: No       Mobility Bed Mobility Overal bed mobility: Needs Assistance Bed  Mobility: Rolling;Supine to Sit;Sit to Supine Rolling: Mod assist   Supine to sit: Mod assist Sit to supine: Min assist   General bed mobility comments: pt exiting on the L side of the bed today due to trach collar and not on vent anymore. pt initiated and able to push with L UE. pt static sitting with bil UE min guard (A). Pt initiated return to supine with (A) of bil LE.    Transfers Overall transfer level: Needs assistance   Transfers: Sit to/from Stand Sit to Stand: Min assist         General transfer comment: pt taking steps toward [facility] with 1 person face to face transfer    Balance Overall balance assessment: Needs assistance Sitting-balance support: Bilateral upper extremity supported;Feet supported Sitting balance-Leahy Scale: Fair     Standing balance support: Bilateral upper extremity supported Standing balance-Leahy Scale: Fair Standing balance comment: reliant on therapist but able to initiate weight shift                           ADL either performed or assessed with clinical judgement   ADL Overall ADL's : Needs assistance/impaired                     Lower Body Dressing: Total assistance Lower Body Dressing Details (indicate cue type and reason): don socks     Toileting- Clothing Manipulation and Hygiene: Total assistance Toileting - Clothing Manipulation Details (indicate cue type and reason): supine rolling R and L for peri care. pt incontinence of stool and no awareness.  General ADL Comments: Pt with all braces checks, skin checked under collar and abdominal binder without any changes noted at this time     Vision       Perception     Praxis      Cognition Arousal/Alertness: Awake/alert Behavior During Therapy: Flat affect Overall Cognitive Status: Impaired/Different from baseline Area of Impairment: Following commands;Awareness               Rancho Levels of Cognitive Functioning Rancho Los Amigos Scales  of Cognitive Functioning: Confused/inappropriate/non-agitated       Following Commands: Follows multi-step commands inconsistently;Follows one step commands consistently Safety/Judgement: Decreased awareness of safety;Decreased awareness of deficits   Problem Solving: Slow processing General Comments: pt on trach collar inflated at this time. Pt helping with bed mobility for hygiene        Exercises Other Exercises Other Exercises: total (A) to don brace TLSO   Shoulder Instructions       General Comments VSS , RN deep suctioned multiple times during session    Pertinent Vitals/ Pain       Pain Assessment: Faces Faces Pain Scale: Hurts little more Pain Location: generalized / back Pain Descriptors / Indicators: Grimacing Pain Intervention(s): Monitored during session;Premedicated before session;Repositioned  Home Living                                          Prior Functioning/Environment              Frequency  Min 2X/week        Progress Toward Goals  OT Goals(current goals can now be found in the care plan section)  Progress towards OT goals: Progressing toward goals  Acute Rehab OT Goals Patient Stated Goal: did not state OT Goal Formulation: Patient unable to participate in goal setting Time For Goal Achievement: 10/03/20 Potential to Achieve Goals: Good ADL Goals Pt Will Perform Grooming: with mod assist;sitting Additional ADL Goal #1: pt will consistently follow 1 step commands Additional ADL Goal #2: pt will demonstrate static sitting min (A) as precursor to adls  Plan Discharge plan remains appropriate    Co-evaluation                 AM-PAC OT "6 Clicks" Daily Activity     Outcome Measure   Help from another person eating meals?: Total Help from another person taking care of personal grooming?: Total Help from another person toileting, which includes using toliet, bedpan, or urinal?: Total Help from another  person bathing (including washing, rinsing, drying)?: Total Help from another person to put on and taking off regular upper body clothing?: Total Help from another person to put on and taking off regular lower body clothing?: Total 6 Click Score: 6    End of Session Equipment Utilized During Treatment: Oxygen;Back brace;Cervical collar;Gait belt  OT Visit Diagnosis: Muscle weakness (generalized) (M62.81)   Activity Tolerance Patient tolerated treatment well   Patient Left in bed;with call bell/phone within reach;with restraints reapplied;with SCD's reapplied   Nurse Communication Mobility status;Precautions        Time: 1610-9604 OT Time Calculation (min): 23 min  Charges: OT General Charges $OT Visit: 1 Visit OT Treatments $Self Care/Home Management : 23-37 mins   Brynn, OTR/L  Acute Rehabilitation Services Pager: [phone] Office: [phone] .    Mateo Flow 09/25/2020, 10:23 AM

## 2020-09-25 NOTE — Progress Notes (Signed)
Maintaining stats in the 80's when sleeping . SpO2 83 ; Discussed with Respiratory . Escalate back to vent . Rate 15

## 2020-09-26 LAB — GLUCOSE, CAPILLARY
Glucose-Capillary: 113 mg/dL — ABNORMAL HIGH (ref 70–99)
Glucose-Capillary: 117 mg/dL — ABNORMAL HIGH (ref 70–99)
Glucose-Capillary: 138 mg/dL — ABNORMAL HIGH (ref 70–99)
Glucose-Capillary: 142 mg/dL — ABNORMAL HIGH (ref 70–99)
Glucose-Capillary: 147 mg/dL — ABNORMAL HIGH (ref 70–99)
Glucose-Capillary: 151 mg/dL — ABNORMAL HIGH (ref 70–99)

## 2020-09-26 MED ORDER — PROSOURCE TF PO LIQD
45.0000 mL | Freq: Every day | ORAL | Status: DC
  Administered 2020-09-26 – 2020-09-30 (×5): 45 mL
  Filled 2020-09-26 (×5): qty 45

## 2020-09-26 NOTE — Progress Notes (Signed)
Occupational Therapy Treatment Patient Details Name: Charles Weeks MRN: [id number] DOB: 09-20-1965 Today's Date: 09/26/2020    History of present illness 55 y.o. male who presented 4/1 after falling down about 11 stairs and sustained R PTX, anterior subluxation of R mandibular condyle, multiple bil ribs fxs at junction of thoracic vertebral bodies T5-8 on R and T7-8 on L, 3 column T8 vertebral body fx, T7 neural arch fx, R TVP T4-8 fxs, and R C7 TVP fx through foramen. R chest tube inserted 4/1. Pt found to be hypoxic and was intubated 4/2. No evidence of vascular injury to neck, facial fx, acute intracranial injury, lumbar spine fx, R elbow acute abnormality, or pelvic fx identified on imaging. S/p trach and PEG placement 4/18. S/p L chest tube placement 4/19. PMH: HTN, COPD, CAD, s/p MI with DES RCA in Jan 2020 just on ASA, depression/anxiety, and BPH.   OT comments  Pt progressed OOB to chair this session. Pt tolerating braces well with total (A) for don of back brace. Requesting order to don brace in sitting instead of laying to help with progression of patient. Pt requires posey belt in chair at this time. Recommendation for CIR updated at this time.   Follow Up Recommendations  CIR    Equipment Recommendations  Wheelchair (measurements OT);Wheelchair cushion (measurements OT);Hospital bed    Recommendations for Other Services Rehab consult    Precautions / Restrictions Precautions Precautions: Cervical;Back Precaution Comments: trach collar, condom cath, TLSO done with rolling if hOB >30 , ccollar at all times, abdominal binder to protect PEG Required Braces or Orthoses: Cervical Brace;Spinal Brace Cervical Brace: Hard collar;At all times Spinal Brace: Thoracolumbosacral orthotic;Applied in supine position Restrictions Weight Bearing Restrictions: No       Mobility Bed Mobility Overal bed mobility: Needs Assistance Bed Mobility: Rolling;Supine to Sit;Sit to Supine Rolling:  Min assist Sidelying to sit: Mod assist;+2 for safety/equipment       General bed mobility comments: Exiting left side of the bed, minA to guide hips into sidelying, modA for trunk to upright positioning    Transfers Overall transfer level: Needs assistance   Transfers: Sit to/from Stand Sit to Stand: Min assist;+2 physical assistance         General transfer comment: MinA + 2 to rise from edge of bed and chair, good initiation noted by pt    Balance Overall balance assessment: Needs assistance Sitting-balance support: Feet supported;Single extremity supported Sitting balance-Leahy Scale: Poor Sitting balance - Comments: Reliant on single UE support-bilateral UE support. Close supervision for safety   Standing balance support: Bilateral upper extremity supported Standing balance-Leahy Scale: Poor Standing balance comment: reliant on RW                           ADL either performed or assessed with clinical judgement   ADL Overall ADL's : Needs assistance/impaired Eating/Feeding: NPO   Grooming: Wash/dry hands;Minimal assistance;Sitting Grooming Details (indicate cue type and reason): wiping face with wash cloth R hand this session in chair. pt does drop wash cloth several times                             Functional mobility during ADLs: +2 for physical assistance;Moderate assistance General ADL Comments: pt progressed from bed to chair this session. pt with decreased oxygen saturations limiting progression. Pt watching tV land in chair and nodding that he wants this program  Vision       Perception     Praxis      Cognition Arousal/Alertness: Awake/alert Behavior During Therapy: Flat affect Overall Cognitive Status: Impaired/Different from baseline Area of Impairment: Following commands;Awareness               Rancho Levels of Cognitive Functioning Rancho Los Amigos Scales of Cognitive Functioning:  Confused/inappropriate/non-agitated       Following Commands: Follows multi-step commands inconsistently;Follows one step commands consistently Safety/Judgement: Decreased awareness of safety;Decreased awareness of deficits   Problem Solving: Slow processing General Comments: Not attempting to mouth words, but following commands, smiling intermittently at therapist, participatory in session. following one step commands with increased time delay of ~ 15-20 seconds        Exercises Other Exercises Other Exercises: total (A) to don brace TLSO   Shoulder Instructions       General Comments pt trach collar 10 L 60% 93% in bed. pt with decrease 80% with transfer. pt sitting in chair 96%    Pertinent Vitals/ Pain       Pain Assessment: Faces Faces Pain Scale: Hurts a little bit Pain Location: generalized / back Pain Descriptors / Indicators: Guarding Pain Intervention(s): Monitored during session;Repositioned  Home Living                                          Prior Functioning/Environment              Frequency  Min 2X/week        Progress Toward Goals  OT Goals(current goals can now be found in the care plan section)  Progress towards OT goals: Progressing toward goals  Acute Rehab OT Goals Patient Stated Goal: did not state OT Goal Formulation: Patient unable to participate in goal setting Time For Goal Achievement: 10/03/20 Potential to Achieve Goals: Good ADL Goals Pt Will Perform Grooming: with mod assist;sitting Additional ADL Goal #1: pt will consistently follow 1 step commands Additional ADL Goal #2: pt will demonstrate static sitting min (A) as precursor to adls  Plan Discharge plan needs to be updated    Co-evaluation    PT/OT/SLP Co-Evaluation/Treatment: Yes Reason for Co-Treatment: Complexity of the patient's impairments (multi-system involvement);For patient/therapist safety;To address functional/ADL transfers PT goals  addressed during session: Mobility/safety with mobility OT goals addressed during session: ADL's and self-care;Proper use of Adaptive equipment and DME;Strengthening/ROM      AM-PAC OT "6 Clicks" Daily Activity     Outcome Measure   Help from another person eating meals?: A Lot Help from another person taking care of personal grooming?: A Lot Help from another person toileting, which includes using toliet, bedpan, or urinal?: Total Help from another person bathing (including washing, rinsing, drying)?: Total Help from another person to put on and taking off regular upper body clothing?: Total Help from another person to put on and taking off regular lower body clothing?: Total 6 Click Score: 8    End of Session Equipment Utilized During Treatment: Oxygen;Back brace;Cervical collar;Gait belt  OT Visit Diagnosis: Muscle weakness (generalized) (M62.81)   Activity Tolerance Patient tolerated treatment well   Patient Left in bed;with call bell/phone within reach;with restraints reapplied;with SCD's reapplied   Nurse Communication Mobility status;Precautions        Time: 2778-2423 OT Time Calculation (min): 29 min  Charges: OT General Charges $OT Visit: 1 Visit OT Treatments $Self Care/Home  Management : 8-22 mins   Brynn, OTR/L  Acute Rehabilitation Services Pager: [phone] Office: [phone] .    Mateo Flow 09/26/2020, 10:10 AM

## 2020-09-26 NOTE — Progress Notes (Signed)
Trauma/Critical Care Follow Up Note  Subjective:    Overnight Issues:   Objective:  Vital signs for last 24 hours: Temp:  [97.9 F (36.6 C)-98.6 F (37 C)] 97.9 F (36.6 C) (05/11 0800) Pulse Rate:  [82-194] 83 (05/11 1105) Resp:  [11-36] 13 (05/11 1105) BP: (106-148)/(67-90) 106/67 (05/11 1105) SpO2:  [82 %-100 %] 94 % (05/11 1105) FiO2 (%):  [40 %-60 %] 40 % (05/11 1105) Weight:  [83.2 kg] 83.2 kg (05/11 0409)  Hemodynamic parameters for last 24 hours:    Intake/Output from previous day: 05/10 0701 - 05/11 0700 In: 2650 [I.V.:70; NG/GT:2580] Out: 1500 [Urine:1500]  Intake/Output this shift: Total I/O In: 351 [NG/GT:351] Out: -   Vent settings for last 24 hours: Vent Mode: PRVC FiO2 (%):  [40 %-60 %] 40 % Set Rate:  [18 bmp] 18 bmp Vt Set:  [480 mL] 480 mL PEEP:  [5 cmH20] 5 cmH20 Plateau Pressure:  [17 cmH20-19 cmH20] 19 cmH20  Physical Exam:  Gen: comfortable, no distress Neuro: non-focal exam, f/c HEENT: PERRL Neck: supple CV: RRR Pulm: unlabored breathing on TC Abd: soft, NT GU: clear yellow urine Extr: wwp, no edema   Results for orders placed or performed during the hospital encounter of 08/17/20 (from the past 24 hour(s))  Glucose, capillary     Status: Abnormal   Collection Time: 09/25/20 12:14 PM  Result Value Ref Range   Glucose-Capillary 140 (H) 70 - 99 mg/dL  Glucose, capillary     Status: Abnormal   Collection Time: 09/25/20  3:59 PM  Result Value Ref Range   Glucose-Capillary 136 (H) 70 - 99 mg/dL  Glucose, capillary     Status: Abnormal   Collection Time: 09/25/20  8:08 PM  Result Value Ref Range   Glucose-Capillary 151 (H) 70 - 99 mg/dL  Glucose, capillary     Status: Abnormal   Collection Time: 09/25/20 11:55 PM  Result Value Ref Range   Glucose-Capillary 132 (H) 70 - 99 mg/dL  Glucose, capillary     Status: Abnormal   Collection Time: 09/26/20  4:06 AM  Result Value Ref Range   Glucose-Capillary 142 (H) 70 - 99 mg/dL   Glucose, capillary     Status: Abnormal   Collection Time: 09/26/20  7:51 AM  Result Value Ref Range   Glucose-Capillary 138 (H) 70 - 99 mg/dL  Glucose, capillary     Status: Abnormal   Collection Time: 09/26/20 11:25 AM  Result Value Ref Range   Glucose-Capillary 151 (H) 70 - 99 mg/dL    Assessment & Plan:  Present on Admission: . Thoracic spine fracture (HCC)    LOS: 40 days   Additional comments:I reviewed the patient's new clinical lab test results.   and I reviewed the patients new imaging test results.    Fall from height  R PTX, B effusion Multiple B rib fxs- these are at the junction of the thoracic vertebral bodies, pain control 3 column T8 vertebral body fx-perDr. Jones,non-op now, plan mobilize in brace once off vent. Needs CTO brace for HOB>30 degrees. T7 neural arch fx- per nsgy R TVP T4-8 - pain control, per nsgy R C7 TVP fx through foramen- C collar per Dr. Yetta Barre, CTAnegative Large scalp laceration/right eyebrow laceration- repair by Dr. Lovick4/05/2020, sutures removed Right neck abrasion/skin tear- bacitracin BID Facial ecchymosis- CT maxillofacialdemonstrates no facial bone fractures. Some anterior subluxation of the right mandibular condyle HTN- home lopressor 25mg  daily CAD/H/O MI/DES- on ASA at home. Hold for now.  Monitor, on tele Acute hypercarbic ventilator dependent respiratory failure/COPD-Trach/PEG 4/18by Dr. Janee Morn. HTC trialsgoing well, on the vent this AM due to mildly labored breathing  Rash on back- benadryl Hypotension- resolved Depression/anxiety- home bupropion whentaking PO, home buspar BPH- home terazosin, repeatedly failedvoiding trials, increased bethanachol 4/15. I&O q6h ABL anemia- stable FEN-TF,bowel regimen, seroquel adjustment helped a lot VTE-[facility]WH Dispo- ICU;HTC 24/7  Critical Care Total Time: 35 minutes  Diamantina Monks, MD Trauma & General Surgery Please use [email or web address] to contact on  call provider  09/26/2020  *Care during the described time interval was provided by me. I have reviewed this patient's available data, including medical history, events of note, physical examination and test results as part of my evaluation.

## 2020-09-26 NOTE — Procedures (Signed)
Objective Swallowing Evaluation: Type of Study: FEES-Fiberoptic Endoscopic Evaluation of Swallow   Patient Details  Name: Charles Weeks MRN: [id number] Date of Birth: Nov 26, 1965  Today's Date: 09/26/2020 Time: SLP Start Time (ACUTE ONLY): 1430 -SLP Stop Time (ACUTE ONLY): 1500  SLP Time Calculation (min) (ACUTE ONLY): 30 min   Past Medical History:  Past Medical History:  Diagnosis Date  . BPH (benign prostatic hyperplasia) 08/17/2020  . CAD (coronary artery disease) 08/17/2020  . COPD (chronic obstructive pulmonary disease) (HCC) 08/17/2020   gold  . HTN (hypertension) 08/17/2020   Past Surgical History:  Past Surgical History:  Procedure Laterality Date  . PEG PLACEMENT N/A 09/03/2020   Procedure: PERCUTANEOUS ENDOSCOPIC GASTROSTOMY (PEG) PLACEMENT;  Surgeon: Violeta Gelinas, MD;  Location: [facility] OR;  Service: General;  Laterality: N/A;  . PERCUTANEOUS TRACHEOSTOMY N/A 09/03/2020   Procedure: PERCUTANEOUS TRACHEOSTOMY USING 6 SHILEY;  Surgeon: Violeta Gelinas, MD;  Location: [facility] OR;  Service: General;  Laterality: N/A;   HPI: Pt is a 55 y.o. male who presented 4/1 after falling down about 11 stairs (sister reported fell over a half wall from second floor) and sustained R PTX, anterior subluxation of R mandibular condyle, multiple bil ribs fxs at junction of thoracic vertebral bodies T5-8 on R and T7-8 on L, 3 column T8 vertebral body fx, T7 neural arch fx, R TVP T4-8 fxs, and R C7 TVP fx through foramen. R chest tube 4/1. Pt found to be hypoxic and was intubated 4/2. Received trach and PEG placement 4/18. S/p L chest tube placement 4/19. PMH: HTN, COPD, CAD, s/p MI with DES RCA in Jan 2020 just on ASA, depression/anxiety, and BPH.   No data recorded   Assessment / Plan / Recommendation  CHL IP CLINICAL IMPRESSIONS 09/26/2020  Clinical Impression FEES completed with pt wearing PMV on trach collar oxygen. Repositioned with back brace donned, bed at approximately 60 degrees. His head  was moderately extended position due to cervical collar. Laryngeal and pharyngeal anatomy appeared relatively unremarkable with minimal edema of false cords. Non swallow speech tasks revealed mobile tongue base and pharyngeal contraction and adequate vocal cord adduction. Piecemeal bolus transfer of puree noted and min oral residue with solid. Swallows were initiated without observed delays and mild pyriform sinus residue however interarytenoid space was clear. He consistently managed 5-6 sequentinal straw sips thin liquids without detection of aspiration when scope lowered close to vocal cords or during requests to cough. Respirations were stable, unlabored and vitals stable. Recommend Dys 2 texture, thin liquids, donn PMV, straws allowed, pills whole in puree, full assist to eat, ensure trunk is upright in bed and back brace donned. ST will follow for safety and upgrade of solids.  SLP Visit Diagnosis Dysphagia, oropharyngeal phase (R13.12)  Attention and concentration deficit following --  Frontal lobe and executive function deficit following --  Impact on safety and function Mild aspiration risk      CHL IP TREATMENT RECOMMENDATION 09/26/2020  Treatment Recommendations Therapy as outlined in treatment plan below     Prognosis 09/26/2020  Prognosis for Safe Diet Advancement Good  Barriers to Reach Goals Cognitive deficits  Barriers/Prognosis Comment --    CHL IP DIET RECOMMENDATION 09/26/2020  SLP Diet Recommendations Dysphagia 2 (Fine chop) solids;Thin liquid  Liquid Administration via Cup;Straw  Medication Administration Whole meds with puree  Compensations Minimize environmental distractions;Slow rate;Small sips/bites;Lingual sweep for clearance of pocketing  Postural Changes (No Data)      CHL IP OTHER RECOMMENDATIONS 09/26/2020  Recommended Consults --  Oral Care Recommendations Oral care BID  Other Recommendations --      CHL IP FOLLOW UP RECOMMENDATIONS 09/26/2020  Follow up  Recommendations Skilled Nursing facility;24 hour supervision/assistance      CHL IP FREQUENCY AND DURATION 09/26/2020  Speech Therapy Frequency (ACUTE ONLY) min 2x/week  Treatment Duration 2 weeks           CHL IP ORAL PHASE 09/26/2020  Oral Phase Impaired  Oral - Pudding Teaspoon --  Oral - Pudding Cup --  Oral - Honey Teaspoon WFL  Oral - Honey Cup WFL  Oral - Nectar Teaspoon --  Oral - Nectar Cup WFL  Oral - Nectar Straw WFL  Oral - Thin Teaspoon Right anterior bolus loss  Oral - Thin Cup Right anterior bolus loss  Oral - Thin Straw WFL  Oral - Puree Piecemeal swallowing  Oral - Mech Soft --  Oral - Regular Lingual/palatal residue  Oral - Multi-Consistency --  Oral - Pill --  Oral Phase - Comment --    CHL IP PHARYNGEAL PHASE 09/26/2020  Pharyngeal Phase Impaired  Pharyngeal- Pudding Teaspoon --  Pharyngeal --  Pharyngeal- Pudding Cup --  Pharyngeal --  Pharyngeal- Honey Teaspoon WFL  Pharyngeal --  Pharyngeal- Honey Cup WFL  Pharyngeal --  Pharyngeal- Nectar Teaspoon --  Pharyngeal --  Pharyngeal- Nectar Cup Pharyngeal residue - pyriform  Pharyngeal --  Pharyngeal- Nectar Straw Pharyngeal residue - pyriform  Pharyngeal --  Pharyngeal- Thin Teaspoon --  Pharyngeal --  Pharyngeal- Thin Cup Pharyngeal residue - pyriform  Pharyngeal --  Pharyngeal- Thin Straw --  Pharyngeal --  Pharyngeal- Puree Pharyngeal residue - pyriform  Pharyngeal --  Pharyngeal- Mechanical Soft --  Pharyngeal --  Pharyngeal- Regular WFL  Pharyngeal --  Pharyngeal- Multi-consistency --  Pharyngeal --  Pharyngeal- Pill --  Pharyngeal --  Pharyngeal Comment --     CHL IP CERVICAL ESOPHAGEAL PHASE 09/26/2020  Cervical Esophageal Phase WFL  Pudding Teaspoon --  Pudding Cup --  Honey Teaspoon --  Honey Cup --  Nectar Teaspoon --  Nectar Cup --  Nectar Straw --  Thin Teaspoon --  Thin Cup --  Thin Straw --  Puree --  Mechanical Soft --  Regular --  Multi-consistency --  Pill  --  Cervical Esophageal Comment --     Charles Weeks 09/26/2020, 4:22 PM Charles Weeks Face.Ed Nurse, children's [phone] Office [phone]

## 2020-09-26 NOTE — Progress Notes (Signed)
Inpatient Rehab Admissions Coordinator Note:   Per updated therapy recommendations, pt was screened for CIR candidacy by Estill Dooms, PT, DPT.  At this time note pt still requiring 40% trach collar.  Would need to be 35% to be considered for CIR.  Will check back tomorrow for continued weaning.  Please contact me with questions.   Estill Dooms, PT, DPT [phone] 09/26/20 10:58 AM

## 2020-09-26 NOTE — Progress Notes (Signed)
Bladder scan showed greater than 896, in and out performed . Obtain 1500cc of cloudy urine

## 2020-09-26 NOTE — Progress Notes (Signed)
Nutrition Follow-up  DOCUMENTATION CODES:   Not applicable  INTERVENTION:   Tube Feeding via PEG:  Pivot 1.5 at 60 ml/hr Add 45 ml ProSource daily   Provides 2200 kcals, 146g of protein, 1092 mL of free water  200 ml free water every 4 hours Total free water: 2292 ml   MVI with minerals daily   NUTRITION DIAGNOSIS:   Increased nutrient needs related to  (trauma) as evidenced by estimated needs. Ongoing.   GOAL:   Patient will meet greater than or equal to 90% of their needs Met with TF.   MONITOR:   Vent status,TF tolerance  REASON FOR ASSESSMENT:   Ventilator,Consult Enteral/tube feeding initiation and management  ASSESSMENT:   Pt with PMH of COPD followed by Dr Melvyn Novas, HTN, CAD s/p stent who was admitted after fall over 1/2 wall from the second story landing with multiple rib fxs, R pneumothorax, large scalp laceration, and T8 vertebral body fxs, T7, T4-8, C7 fxs.  Pt discussed during ICU rounds and with RN.  Per chart review, pt continues to work towards TC day and night. Per CSW notes plan has changed and now family seeking SNF placement.  Per SLP pt has improved with PMV use and plan for FEES today.  Weight has ranged from 180-220 lb, currently 183 lb. Pt making progress with PT and no new wounds.   4/18 s/p trach and PEG   Medications reviewed and include: colace, folic acid, SSI, MVI with minerals, thiamine  Labs reviewed:  CBG's: 136-151  74F PEG  UOP: 1500 ml  I&O: +3 L  Diet Order:   Diet Order            Diet NPO time specified  Diet effective midnight                 EDUCATION NEEDS:   No education needs have been identified at this time  Skin:  DTI: vertebral column   Last BM:  5/10 type 5 x 2 small and medium  Height:   Ht Readings from Last 1 Encounters:  08/17/20 5' 8" (1.727 m)    Weight:   Wt Readings from Last 1 Encounters:  09/26/20 83.2 kg    Ideal Body Weight:     BMI:  Body mass index is 27.89  kg/m.  Estimated Nutritional Needs:   Kcal:  2000-2200  Protein:  120-135 grams  Fluid:  >2 L/day  Lockie Pares., RD, LDN, CNSC See AMiON for contact information

## 2020-09-26 NOTE — Progress Notes (Signed)
Physical Therapy Treatment Patient Details Name: Charles Weeks MRN: [id number] DOB: [birth year] Today's Date: 09/26/2020    History of Present Illness 55 y.o. male who presented 4/1 after falling down about 11 stairs and sustained R PTX, anterior subluxation of R mandibular condyle, multiple bil ribs fxs at junction of thoracic vertebral bodies T5-8 on R and T7-8 on L, 3 column T8 vertebral body fx, T7 neural arch fx, R TVP T4-8 fxs, and R C7 TVP fx through foramen. R chest tube inserted 4/1. Pt found to be hypoxic and was intubated 4/2. No evidence of vascular injury to neck, facial fx, acute intracranial injury, lumbar spine fx, R elbow acute abnormality, or pelvic fx identified on imaging. S/p trach and PEG placement 4/18. S/p L chest tube placement 4/19. PMH: HTN, COPD, CAD, s/p MI with DES RCA in Jan 2020 just on ASA, depression/anxiety, and BPH.    PT Comments    Pt making good progress towards his physical therapy goals today, demonstrating improved activity tolerance. Session focused on bed mobility, transfers, and limited ambulation to promote out of bed mobility. Pt requiring min-mod assist (+2 safety) for all aspects. Ambulating from bed to chair, ~5 ft, with a walker. Desaturation to 80% SpO2 on 60% TC, however, quickly rebounds to 94-100%. Recommend post acute rehab to address deficits, maximize functional mobility, and decrease caregiver burden.     Follow Up Recommendations  CIR     Equipment Recommendations  Wheelchair (measurements PT);Wheelchair cushion (measurements PT);Hospital bed;Rolling walker with 5" wheels;3in1 (PT)    Recommendations for Other Services       Precautions / Restrictions Precautions Precautions: Cervical;Back Precaution Comments: trach collar, condom cath, TLSO done with rolling if hOB >30 , ccollar at all times, abdominal binder to protect PEG Required Braces or Orthoses: Cervical Brace;Spinal Brace Cervical Brace: Hard collar;At all times Spinal  Brace: Thoracolumbosacral orthotic;Applied in supine position Restrictions Weight Bearing Restrictions: No    Mobility  Bed Mobility Overal bed mobility: Needs Assistance Bed Mobility: Rolling;Supine to Sit;Sit to Supine Rolling: Min assist Sidelying to sit: Mod assist;+2 for safety/equipment       General bed mobility comments: Exiting left side of the bed, minA to guide hips into sidelying, modA for trunk to upright positioning    Transfers Overall transfer level: Needs assistance   Transfers: Sit to/from Stand Sit to Stand: Min assist;+2 physical assistance         General transfer comment: MinA + 2 to rise from edge of bed and chair, good initiation noted by pt  Ambulation/Gait Ambulation/Gait assistance: Mod assist;+2 physical assistance Gait Distance (Feet): 5 Feet Assistive device: Rolling walker (2 wheeled) Gait Pattern/deviations: Step-through pattern;Decreased stride length Gait velocity: decreased Gait velocity interpretation: <1.8 ft/sec, indicate of risk for recurrent falls General Gait Details: Cues for sequencing/direction, increased step length, stepping initiation. ModA + 2 for stability   Stairs             Wheelchair Mobility    Modified Rankin (Stroke Patients Only)       Balance Overall balance assessment: Needs assistance Sitting-balance support: Feet supported;Single extremity supported Sitting balance-Leahy Scale: Poor Sitting balance - Comments: Reliant on single UE support-bilateral UE support. Close supervision for safety   Standing balance support: Bilateral upper extremity supported Standing balance-Leahy Scale: Poor Standing balance comment: reliant on RW                            Cognition Arousal/Alertness:  Awake/alert Behavior During Therapy: Flat affect Overall Cognitive Status: Impaired/Different from baseline Area of Impairment: Following commands;Awareness               Rancho Levels of  Cognitive Functioning Rancho Los Amigos Scales of Cognitive Functioning: Confused/inappropriate/non-agitated       Following Commands: Follows multi-step commands inconsistently;Follows one step commands consistently Safety/Judgement: Decreased awareness of safety;Decreased awareness of deficits   Problem Solving: Slow processing General Comments: Not attempting to mouth words, but following commands, smiling intermittently at therapist, participatory in session.      Exercises Other Exercises Other Exercises: total (A) to don brace TLSO    General Comments        Pertinent Vitals/Pain Pain Assessment: Faces Faces Pain Scale: Hurts a little bit Pain Location: generalized / back Pain Descriptors / Indicators: Guarding Pain Intervention(s): Monitored during session    Home Living                      Prior Function            PT Goals (current goals can now be found in the care plan section) Acute Rehab PT Goals Patient Stated Goal: did not state Potential to Achieve Goals: Fair Progress towards PT goals: Progressing toward goals    Frequency    Min 4X/week      PT Plan Discharge plan needs to be updated;Frequency needs to be updated    Co-evaluation PT/OT/SLP Co-Evaluation/Treatment: Yes Reason for Co-Treatment: Complexity of the patient's impairments (multi-system involvement);Necessary to address cognition/behavior during functional activity;For patient/therapist safety;To address functional/ADL transfers PT goals addressed during session: Mobility/safety with mobility        AM-PAC PT "6 Clicks" Mobility   Outcome Measure  Help needed turning from your back to your side while in a flat bed without using bedrails?: A Little Help needed moving from lying on your back to sitting on the side of a flat bed without using bedrails?: A Lot Help needed moving to and from a bed to a chair (including a wheelchair)?: A Lot Help needed standing up from a  chair using your arms (e.g., wheelchair or bedside chair)?: A Little Help needed to walk in hospital room?: A Lot Help needed climbing 3-5 steps with a railing? : Total 6 Click Score: 13    End of Session Equipment Utilized During Treatment: Gait belt;Back brace;Cervical collar;Oxygen Activity Tolerance: Patient tolerated treatment well Patient left: in chair;with call bell/phone within reach;with chair alarm set;with restraints reapplied Nurse Communication: Mobility status PT Visit Diagnosis: Muscle weakness (generalized) (M62.81);Difficulty in walking, not elsewhere classified (R26.2);Unsteadiness on feet (R26.81);Other abnormalities of gait and mobility (R26.89)     Time: 7106-2694 PT Time Calculation (min) (ACUTE ONLY): 28 min  Charges:  $Therapeutic Activity: 8-22 mins                     Lillia Pauls, PT, DPT Acute Rehabilitation Services Pager [phone] Office [phone]    Norval Morton 09/26/2020, 9:59 AM

## 2020-09-26 NOTE — Progress Notes (Deleted)
OT NOTE  RN STAFF  Please check splint every 4 hours during shift ( remove splint ) to assess for: * pain * redness *swelling  If any symptoms above present remove splint for 15 minutes. If symptoms continue - keep the splint removed and notify OT staff [phone] immediately.   Keep the UE elevated at all times on pillows / towels.  Splint should have two splint covers (blue cloth) with a mesh bag for cleaning. The splint cover (blue cloth) should be washed with soapy water and hung out to dry or washed on delicate in home washer. Do not throw away splint cover it is washable. Please have a set location to store the splints in patients room for daily application.    Splints are to be worn for 4 hours and off for 2 hours  Examples of schedule:  Splints off at 6am Splints on at 8 am Splints off at 12 pm Splints on at 2 pm Splints off at 6pm Splints on at 8 pm    To place the splint on:  1. Place the wrist in position first and secure strap 2. Position each digit and apply strap 3. The thumb and forearm strap should be applied last   The splints should fit as appeared here Strap over the PIP joint of finger Strap over the MCP ( knuckles)  joints of the hand Strap at the thumb Strap at the wrist Strap at the forearm    SOFT BLUE ELBOW SPLINT  RN STAFF  Please check splint every 4 hours during shift ( remove splint ) to assess for: * pain * redness *swelling *skin break down    If any symptoms above are present remove splint for 15 minutes. If symptoms continue - keep the splint removed and notify OT staff [phone] immediately.   Keep the splinted upper extremity elevated at all times on pillows / towels.  Splints are to be worn for 4 hours and off for 2 hours  Wear schedule example: OFF : 6 AM  ON: 8 AM OFF : 12 PM ON : 2 PM OFF :6 PM  THANK YOU OCCUPATIONAL THERAPY [phone]  Charles Weeks, OTR/L  Acute Rehabilitation Services Pager:  [phone] Office: [phone] .

## 2020-09-27 LAB — GLUCOSE, CAPILLARY
Glucose-Capillary: 125 mg/dL — ABNORMAL HIGH (ref 70–99)
Glucose-Capillary: 130 mg/dL — ABNORMAL HIGH (ref 70–99)
Glucose-Capillary: 132 mg/dL — ABNORMAL HIGH (ref 70–99)
Glucose-Capillary: 137 mg/dL — ABNORMAL HIGH (ref 70–99)
Glucose-Capillary: 139 mg/dL — ABNORMAL HIGH (ref 70–99)

## 2020-09-27 MED ORDER — BUPROPION HCL ER (XL) 300 MG PO TB24
300.0000 mg | ORAL_TABLET | Freq: Every day | ORAL | Status: DC
  Administered 2020-09-28 – 2020-10-09 (×12): 300 mg via ORAL
  Filled 2020-09-27 (×14): qty 1

## 2020-09-27 MED ORDER — TAMSULOSIN HCL 0.4 MG PO CAPS
0.4000 mg | ORAL_CAPSULE | Freq: Every day | ORAL | Status: DC
  Administered 2020-09-27: 0.4 mg via ORAL
  Filled 2020-09-27 (×3): qty 1

## 2020-09-27 NOTE — Progress Notes (Signed)
Occupational Therapy Treatment Patient Details Name: Charles Weeks MRN: [id number] DOB: 11-Oct-1965 Today's Date: 09/27/2020    History of present illness 55 y.o. male who presented 4/1 after falling down about 11 stairs and sustained R PTX, anterior subluxation of R mandibular condyle, multiple bil ribs fxs at junction of thoracic vertebral bodies T5-8 on R and T7-8 on L, 3 column T8 vertebral body fx, T7 neural arch fx, R TVP T4-8 fxs, and R C7 TVP fx through foramen. R chest tube inserted 4/1. Pt found to be hypoxic and was intubated 4/2. No evidence of vascular injury to neck, facial fx, acute intracranial injury, lumbar spine fx, R elbow acute abnormality, or pelvic fx identified on imaging. S/p trach and PEG placement 4/18. S/p L chest tube placement 4/19. PMH: HTN, COPD, CAD, s/p MI with DES RCA in Jan 2020 just on ASA, depression/anxiety, and BPH.   OT comments  Pt seen in conjunction with PT.  Pt demonstrates improving cognition with behaviors consistent with [place].  He requires min A+2 for functional transfers and mod - max A for ADLs.   Follow Up Recommendations  CIR    Equipment Recommendations  Wheelchair (measurements OT);Wheelchair cushion (measurements OT);Hospital bed    Recommendations for Other Services      Precautions / Restrictions Precautions Precautions: Cervical;Back Precaution Booklet Issued: No Precaution Comments: trach collar, condom cath, TLSO done with rolling if hOB >30 , ccollar at all times, abdominal binder to protect PEG Required Braces or Orthoses: Cervical Brace;Spinal Brace Cervical Brace: Hard collar;At all times Spinal Brace: Thoracolumbosacral orthotic;Applied in supine position Restrictions Weight Bearing Restrictions: No       Mobility Bed Mobility Overal bed mobility: Needs Assistance Bed Mobility: Rolling;Sidelying to Sit Rolling: Min assist Sidelying to sit: Mod assist       General bed mobility comments: minA to roll  in both directions, increase cues for sequencing, and increased time, but pt able to complete almost without any assist. pt then needing minA to raiase trunk from [facility], good effort to push with UE, minA to steady once in sitting.    Transfers Overall transfer level: Needs assistance Equipment used: Rolling walker (2 wheeled) Transfers: Sit to/from Stand Sit to Stand: Mod assist;Min assist;+2 physical assistance         General transfer comment: pt progressed from modA of 2 initially to minA of 1 to stand from chair. completed x5 through session. cues for hand placement with eccentric lower    Balance Overall balance assessment: Needs assistance Sitting-balance support: Single extremity supported Sitting balance-Leahy Scale: Poor Sitting balance - Comments: Reliant on single UE support-bilateral UE support. Close supervision for safety   Standing balance support: Bilateral upper extremity supported Standing balance-Leahy Scale: Poor Standing balance comment: Pt requires BUE support for stability with intermittent min-modA to steady once he has lost balance. poor safety awareness results in frequently letting go of UE support requiring min-modA to steady                           ADL either performed or assessed with clinical judgement   ADL Overall ADL's : Needs assistance/impaired                         Toilet Transfer: Minimal assistance;+2 for physical assistance;+2 for safety/equipment;Stand-pivot;[name];RW   Toileting- Clothing Manipulation and Hygiene: Maximal assistance;Sit to/from stand       Functional mobility during ADLs:  Minimal assistance;+2 for physical assistance;+2 for safety/equipment;Rolling walker       Vision       Perception     Praxis      Cognition Arousal/Alertness: Awake/alert Behavior During Therapy: Flat affect Overall Cognitive Status: Impaired/Different from baseline Area of Impairment: Following commands;Awareness                Rancho Levels of Cognitive Functioning [place] Amigos Scales of Cognitive Functioning: Confused/appropriate   Current Attention Level: Sustained (for short bouts with min - mod cues) Memory: Decreased short-term memory;Decreased recall of precautions Following Commands: Follows multi-step commands inconsistently;Follows one step commands consistently Safety/Judgement: Decreased awareness of safety;Decreased awareness of deficits Awareness: Intellectual Problem Solving: Slow processing General Comments: pt attempting to speak multiple times through session, needing repeated cues/prompts a few times to answer question. The pt was able to follow most simple commands, needs supervision for impulsivity        Exercises     Shoulder Instructions       General Comments VSS with pt on 60% Fi02 via trach collar    Pertinent Vitals/ Pain       Pain Assessment: Faces Faces Pain Scale: Hurts a little bit Pain Location: generalized, especially with coughing Pain Descriptors / Indicators: Discomfort Pain Intervention(s): Monitored during session  Home Living                                          Prior Functioning/Environment              Frequency  Min 2X/week        Progress Toward Goals  OT Goals(current goals can now be found in the care plan section)  Progress towards OT goals: Progressing toward goals  Acute Rehab OT Goals Patient Stated Goal: did not state  Plan Discharge plan remains appropriate    Co-evaluation    PT/OT/SLP Co-Evaluation/Treatment: Yes Reason for Co-Treatment: Necessary to address cognition/behavior during functional activity;For patient/therapist safety;To address functional/ADL transfers PT goals addressed during session: Mobility/safety with mobility;Balance;Proper use of DME;Strengthening/ROM OT goals addressed during session: ADL's and self-care      AM-PAC OT "6 Clicks" Daily Activity     Outcome  Measure   Help from another person eating meals?: A Lot Help from another person taking care of personal grooming?: A Lot Help from another person toileting, which includes using toliet, bedpan, or urinal?: A Lot Help from another person bathing (including washing, rinsing, drying)?: A Lot Help from another person to put on and taking off regular upper body clothing?: A Lot Help from another person to put on and taking off regular lower body clothing?: Total 6 Click Score: 11    End of Session Equipment Utilized During Treatment: Oxygen  OT Visit Diagnosis: Unsteadiness on feet (R26.81);Cognitive communication deficit (R41.841)   Activity Tolerance Patient tolerated treatment well   Patient Left in chair;with call bell/phone within reach;with chair alarm set   Nurse Communication Mobility status        Time: 4580-9983 OT Time Calculation (min): 52 min  Charges: OT General Charges $OT Visit: 1 Visit OT Treatments $Self Care/Home Management : 8-22 mins $Therapeutic Activity: 8-22 mins  Eber Jones., OTR/L Acute Rehabilitation Services Pager [phone] Office [phone]    Jeani Hawking M 09/27/2020, 6:11 PM

## 2020-09-27 NOTE — Progress Notes (Signed)
Patient ID: Charles Weeks, male   DOB: 22-Sep-1965, 55 y.o.   MRN: [id number] Follow up - Trauma Critical Care  Patient Details:    Charles Weeks is an 55 y.o. male.  Lines/tubes : Gastrostomy/Enterostomy Percutaneous endoscopic gastrostomy (PEG) 20 Fr. LUQ (Active)  Surrounding Skin Intact;Dry 09/26/20 2000  Tube Status Irrigated 09/26/20 2000  Drainage Appearance None 09/26/20 2000  Dressing Status Clean;Dry;Intact 09/26/20 2000  Dressing Intervention Dressing reinforced 09/24/20 2000  Dressing Type Abdominal Binder 09/26/20 2000  Dressing Change Due 09/27/20 09/26/20 2000  G Port Intake (mL) 100 ml 09/23/20 1600  Output (mL) 0 mL 09/23/20 1000     External Urinary Catheter (Active)  Collection Container Standard drainage bag 09/26/20 2000  Suction (Verified suction is between 40-80 mmHg) N/A (Patient has condom catheter) 09/26/20 0835  Securement Method Securing device (Describe) 09/26/20 2000  Site Assessment Intact;Clean 09/26/20 2000  Intervention Equipment Changed 09/25/20 1800  Output (mL) 0 mL 09/26/20 1649    Microbiology/Sepsis markers: Results for orders placed or performed during the hospital encounter of 08/17/20  Resp Panel by RT-PCR (Flu A&B, Covid) Nasopharyngeal Swab     Status: None   Collection Time: 08/17/20  3:40 PM   Specimen: Nasopharyngeal Swab; Nasopharyngeal(NP) swabs in vial transport medium  Result Value Ref Range Status   SARS Coronavirus 2 by RT PCR NEGATIVE NEGATIVE Final    Comment: (NOTE) SARS-CoV-2 target nucleic acids are NOT DETECTED.  The SARS-CoV-2 RNA is generally detectable in upper respiratory specimens during the acute phase of infection. The lowest concentration of SARS-CoV-2 viral copies this assay can detect is 138 copies/mL. A negative result does not preclude SARS-Cov-2 infection and should not be used as the sole basis for treatment or other patient management decisions. A negative result may occur with  improper specimen  collection/handling, submission of specimen other than nasopharyngeal swab, presence of viral mutation(s) within the areas targeted by this assay, and inadequate number of viral copies(<138 copies/mL). A negative result must be combined with clinical observations, patient history, and epidemiological information. The expected result is Negative.  Fact Sheet for Patients:  BloggerCourse.com  Fact Sheet for Healthcare Providers:  SeriousBroker.it  This test is no t yet approved or cleared by the [place] FDA and  has been authorized for detection and/or diagnosis of SARS-CoV-2 by FDA under an Emergency Use Authorization (EUA). This EUA will remain  in effect (meaning this test can be used) for the duration of the COVID-19 declaration under Section 564(b)(1) of the Act, 21 U.S.C.section 360bbb-3(b)(1), unless the authorization is terminated  or revoked sooner.       Influenza A by PCR NEGATIVE NEGATIVE Final   Influenza B by PCR NEGATIVE NEGATIVE Final    Comment: (NOTE) The Xpert Xpress SARS-CoV-2/FLU/RSV plus assay is intended as an aid in the diagnosis of influenza from Nasopharyngeal swab specimens and should not be used as a sole basis for treatment. Nasal washings and aspirates are unacceptable for Xpert Xpress SARS-CoV-2/FLU/RSV testing.  Fact Sheet for Patients: BloggerCourse.com  Fact Sheet for Healthcare Providers: SeriousBroker.it  This test is not yet approved or cleared by the [place] FDA and has been authorized for detection and/or diagnosis of SARS-CoV-2 by FDA under an Emergency Use Authorization (EUA). This EUA will remain in effect (meaning this test can be used) for the duration of the COVID-19 declaration under Section 564(b)(1) of the Act, 21 U.S.C. section 360bbb-3(b)(1), unless the authorization is terminated or revoked.  Performed at [facility]  Lab, [address] [place], [place] [address]   Surgical PCR screen     Status: None   Collection Time: 08/19/20  7:38 AM   Specimen: Nasal Mucosa; Nasal Swab  Result Value Ref Range Status   MRSA, PCR NEGATIVE NEGATIVE Final   Staphylococcus aureus NEGATIVE NEGATIVE Final    Comment: (NOTE) The Xpert SA Assay (FDA approved for NASAL specimens in patients 72 years of age and older), is one component of a comprehensive surveillance program. It is not intended to diagnose infection nor to guide or monitor treatment. Performed at [facility], [address], [place], [place] [address]   MRSA PCR Screening     Status: None   Collection Time: 09/19/20  7:40 AM   Specimen: Nasal Mucosa; Nasopharyngeal  Result Value Ref Range Status   MRSA by PCR NEGATIVE NEGATIVE Final    Comment:        The GeneXpert MRSA Assay (FDA approved for NASAL specimens only), is one component of a comprehensive MRSA colonization surveillance program. It is not intended to diagnose MRSA infection nor to guide or monitor treatment for MRSA infections. Performed at [facility], [address], [place], [place] [address]     Anti-infectives:  Anti-infectives (From admission, onward)   Start     Dose/Rate Route Frequency Ordered Stop   08/19/20 0845  cefTRIAXone (ROCEPHIN) 2 g in sodium chloride 0.9 % 100 mL IVPB  Status:  Discontinued        2 g 200 mL/hr over 30 Minutes Intravenous Every 24 hours 08/19/20 0753 08/25/20 1304   08/17/20 1515  ceFAZolin (ANCEF) IVPB 2g/100 mL premix        2 g 200 mL/hr over 30 Minutes Intravenous  Once 08/17/20 1500 08/17/20 1530     Consults: Treatment Team:  Tia Alert, MD    Studies:    Events:  Subjective:    Overnight Issues:   Objective:  Vital signs for last 24 hours: Temp:  [98.7 F (37.1 C)-99.4 F (37.4 C)] 99 F (37.2 C) (05/12 0400) Pulse Rate:  [83-118] 107 (05/12 0803) Resp:  [11-28] 22 (05/12 0803) BP:  (101-165)/(66-135) 123/72 (05/12 0600) SpO2:  [87 %-99 %] 97 % (05/12 0811) FiO2 (%):  [40 %-60 %] 60 % (05/12 0811)  Hemodynamic parameters for last 24 hours:    Intake/Output from previous day: 05/11 0701 - 05/12 0700 In: 1840 [NG/GT:1840] Out: 1850 [Urine:1850]  Intake/Output this shift: No intake/output data recorded.  Vent settings for last 24 hours: FiO2 (%):  [40 %-60 %] 60 %  Physical Exam:  General: alert and no respiratory distress Neuro: alert and f/c HEENT/Neck: trach-clean, intact Resp: clear to auscultation bilaterally CVS: RRR GI: soft, NT, PEG Extremities: no edema  Results for orders placed or performed during the hospital encounter of 08/17/20 (from the past 24 hour(s))  Glucose, capillary     Status: Abnormal   Collection Time: 09/26/20 11:25 AM  Result Value Ref Range   Glucose-Capillary 151 (H) 70 - 99 mg/dL  Glucose, capillary     Status: Abnormal   Collection Time: 09/26/20  3:45 PM  Result Value Ref Range   Glucose-Capillary 117 (H) 70 - 99 mg/dL  Glucose, capillary     Status: Abnormal   Collection Time: 09/26/20  7:48 PM  Result Value Ref Range   Glucose-Capillary 147 (H) 70 - 99 mg/dL  Glucose, capillary     Status: Abnormal   Collection Time: 09/26/20  11:34 PM  Result Value Ref Range   Glucose-Capillary 113 (H) 70 - 99 mg/dL  Glucose, capillary     Status: Abnormal   Collection Time: 09/27/20  3:55 AM  Result Value Ref Range   Glucose-Capillary 130 (H) 70 - 99 mg/dL  Glucose, capillary     Status: Abnormal   Collection Time: 09/27/20  7:56 AM  Result Value Ref Range   Glucose-Capillary 132 (H) 70 - 99 mg/dL    Assessment & Plan: Present on Admission: . Thoracic spine fracture (HCC)    LOS: 41 days   Additional comments:I reviewed the patient's new clinical lab test results. . Fall from height  R PTX, B effusion Multiple B rib fxs- these are at the junction of the thoracic vertebral bodies, pain control 3 column T8  vertebral body fx-perDr. Jones,non-op now, plan mobilize in brace once off vent. Needs CTO brace for HOB>30 degrees. T7 neural arch fx- per nsgy R TVP T4-8 - pain control, per nsgy R C7 TVP fx through foramen- C collar per Dr. Yetta Barre, CTAnegative Large scalp laceration/right eyebrow laceration- repair by Dr. Lovick4/05/2020, sutures removed Right neck abrasion/skin tear- bacitracin BID Facial ecchymosis- CT maxillofacialdemonstrates no facial bone fractures. Some anterior subluxation of the right mandibular condyle HTN- home lopressor 25mg  daily CAD/H/O MI/DES- on ASA at home. Hold for now. Monitor, on tele Acute hypercarbic ventilator dependent respiratory failure/COPD-Trach/PEG 4/18by Dr. 03-05-1974. HTC 24h now Rash on back- benadryl Depression/anxiety- resume home bupropion today, home buspar BPH- home terazosin, repeatedly failedvoiding trials, increased bethanachol 4/15. I&O q6h ABL anemia- stable FEN-TF,bowel regimen, seroquel adjustment helped a lot VTE-LMWH Dispo- ICU;HTC 24/7, wean FiO2 as able so we can consider CIR. Critical Care Total Time*: 33 Minutes  5/15, MD, MPH, FACS Trauma & General Surgery Use [email or web address] to contact on call provider  09/27/2020  *Care during the described time interval was provided by me. I have reviewed this patient's available data, including medical history, events of note, physical examination and test results as part of my evaluation.

## 2020-09-27 NOTE — Progress Notes (Signed)
Physical Therapy Treatment Patient Details Name: Charles Weeks MRN: [id number] DOB: November 05, 1965 Today's Date: 09/27/2020    History of Present Illness 55 y.o. male who presented 4/1 after falling down about 11 stairs and sustained R PTX, anterior subluxation of R mandibular condyle, multiple bil ribs fxs at junction of thoracic vertebral bodies T5-8 on R and T7-8 on L, 3 column T8 vertebral body fx, T7 neural arch fx, R TVP T4-8 fxs, and R C7 TVP fx through foramen. R chest tube inserted 4/1. Pt found to be hypoxic and was intubated 4/2. No evidence of vascular injury to neck, facial fx, acute intracranial injury, lumbar spine fx, R elbow acute abnormality, or pelvic fx identified on imaging. S/p trach and PEG placement 4/18. S/p L chest tube placement 4/19. PMH: HTN, COPD, CAD, s/p MI with DES RCA in Jan 2020 just on ASA, depression/anxiety, and BPH.    PT Comments    The pt was seen today for continued progression of OOB mobility and gait training. He was able to demo good improvements in bed mobility, completing the majority of the task with limited assist and simple cues. The pt was also able to demo good improvement in capacity for sit-stand transfers, completing x5 through session with progression from modA to minA with repeated verbal cues for safe hand positioning within the session. The pt continues to need significant cues for safety, to reduce impulsivity, and to increase safety with OOB mobility. For example, the pt requires BUE support for safety with dynamic upright activity, but continues to release RW with mobility resulting in LOB requiring min-modA of 2. The pt will continue to benefit from skilled PT to progress activity tolerance, safety awareness, and stability to reduce risk of falls and injury. Continue to recommend CIR level therapies.     Follow Up Recommendations  CIR     Equipment Recommendations  Wheelchair (measurements PT);Wheelchair cushion (measurements PT);Hospital  bed;Rolling walker with 5" wheels;3in1 (PT)    Recommendations for Other Services       Precautions / Restrictions Precautions Precautions: Cervical;Back Precaution Booklet Issued: No Precaution Comments: trach collar, condom cath, TLSO done with rolling if hOB >30 , ccollar at all times, abdominal binder to protect PEG Required Braces or Orthoses: Cervical Brace;Spinal Brace Cervical Brace: Hard collar;At all times Spinal Brace: Thoracolumbosacral orthotic;Applied in supine position Restrictions Weight Bearing Restrictions: No    Mobility  Bed Mobility Overal bed mobility: Needs Assistance Bed Mobility: Rolling;Sidelying to Sit Rolling: Min assist Sidelying to sit: Mod assist       General bed mobility comments: minA to roll in both directions, increase cues for sequencing, and increased time, but pt able to complete almost without any assist. pt then needing minA to raiase trunk from [facility], good effort to push with UE, minA to steady once in sitting.    Transfers Overall transfer level: Needs assistance Equipment used: Rolling walker (2 wheeled) Transfers: Sit to/from Stand Sit to Stand: Mod assist;Min assist;+2 physical assistance         General transfer comment: pt progressed from modA of 2 initially to minA of 1 to stand from chair. completed x5 through session. cues for hand placement with eccentric lower  Ambulation/Gait Ambulation/Gait assistance: Min assist;+2 physical assistance Gait Distance (Feet): 4 Feet (+3 +3 ft) Assistive device: Rolling walker (2 wheeled) Gait Pattern/deviations: Step-to pattern;Decreased stride length;Trunk flexed;Narrow base of support Gait velocity: decreased   General Gait Details: cues for sequencing/feet positioning. increased challenge with lateral stepping despite cues. modA  at times to correct balance as pt frequently moving outside support of RW      Balance Overall balance assessment: Needs assistance Sitting-balance  support: Single extremity supported Sitting balance-Leahy Scale: Poor Sitting balance - Comments: Reliant on single UE support-bilateral UE support. Close supervision for safety   Standing balance support: Bilateral upper extremity supported Standing balance-Leahy Scale: Poor Standing balance comment: Pt requires BUE support for stability with intermittent min-modA to steady once he has lost balance. poor safety awareness results in frequently letting go of UE support requiring min-modA to steady                            Cognition Arousal/Alertness: Awake/alert Behavior During Therapy: Flat affect Overall Cognitive Status: Impaired/Different from baseline Area of Impairment: Following commands;Awareness               Rancho Levels of Cognitive Functioning [place] Amigos Scales of Cognitive Functioning: Confused/appropriate   Current Attention Level: Sustained (for short bouts) Memory: Decreased short-term memory;Decreased recall of precautions Following Commands: Follows multi-step commands inconsistently;Follows one step commands consistently Safety/Judgement: Decreased awareness of safety;Decreased awareness of deficits   Problem Solving: Slow processing General Comments: pt attempting to speak multiple times through session, needing repeated cues/prompts a few times to answer question. The pt was able to follow most simple commands, needs supervision for impulsivity         General Comments General comments (skin integrity, edema, etc.): pt with significant coughing and secretions, VSS on trach collar      Pertinent Vitals/Pain Pain Assessment: Faces Faces Pain Scale: Hurts a little bit Pain Location: generalized, especially with coughing Pain Descriptors / Indicators: Discomfort Pain Intervention(s): Limited activity within patient's tolerance;Monitored during session;Repositioned           PT Goals (current goals can now be found in the care plan  section) Acute Rehab PT Goals Patient Stated Goal: did not state PT Goal Formulation: Patient unable to participate in goal setting Time For Goal Achievement: 10/03/20 Potential to Achieve Goals: Fair Progress towards PT goals: Progressing toward goals    Frequency    Min 4X/week      PT Plan Current plan remains appropriate    Co-evaluation PT/OT/SLP Co-Evaluation/Treatment: Yes Reason for Co-Treatment: For patient/therapist safety;To address functional/ADL transfers PT goals addressed during session: Mobility/safety with mobility;Balance;Proper use of DME;Strengthening/ROM        AM-PAC PT "6 Clicks" Mobility   Outcome Measure  Help needed turning from your back to your side while in a flat bed without using bedrails?: A Little Help needed moving from lying on your back to sitting on the side of a flat bed without using bedrails?: A Lot Help needed moving to and from a bed to a chair (including a wheelchair)?: A Lot Help needed standing up from a chair using your arms (e.g., wheelchair or bedside chair)?: A Little Help needed to walk in hospital room?: A Lot Help needed climbing 3-5 steps with a railing? : Total 6 Click Score: 13    End of Session Equipment Utilized During Treatment: Gait belt;Back brace;Cervical collar;Oxygen   Patient left: in chair;with call bell/phone within reach;with chair alarm set;with restraints reapplied Nurse Communication: Mobility status PT Visit Diagnosis: Muscle weakness (generalized) (M62.81);Difficulty in walking, not elsewhere classified (R26.2);Unsteadiness on feet (R26.81);Other abnormalities of gait and mobility (R26.89)     Time: 2595-6387 PT Time Calculation (min) (ACUTE ONLY): 50 min  Charges:  $Gait Training:  8-22 mins $Therapeutic Activity: 8-22 mins                     Rolm Baptise, PT, DPT   Acute Rehabilitation Department Pager #: [phone]   Gaetana Michaelis 09/27/2020, 5:27 PM

## 2020-09-27 NOTE — Progress Notes (Signed)
  Speech Language Pathology Treatment: Dysphagia;Passy Muir Speaking valve  Patient Details Name: Charles Weeks MRN: [id number] DOB: 1966/04/19 Today's Date: 09/27/2020 Time: 1035-1100 SLP Time Calculation (min) (ACUTE ONLY): 25 min  Assessment / Plan / Recommendation Clinical Impression  Pt coughed with water with RN who reported pharyngeal secretions were audible as well. SpO2 89-91% on entrance with Bernette Redbird with productive cough when cued. PMV donned and vocal quality lower than normal and suspected additional mucous present. One episode of delayed and strong cough after soda out of 2 oz consumed. Therapist continued to facilitate cough with donning and doffing valve and cues deep breath prior to cough. Cough was weaker today and stated it hurt to take deep breath - facilitated with holding belly with hand. Eventually he produced a strong cough and expectorated significant amount of secretions. Observed with puree today (vs Dys 2) which was transited without difficulty. He sats rose and remained 93-94%.    HPI HPI: Pt is a 55 y.o. male who presented 4/1 after falling down about 11 stairs (sister reported fell over a half wall from second floor) and sustained R PTX, anterior subluxation of R mandibular condyle, multiple bil ribs fxs at junction of thoracic vertebral bodies T5-8 on R and T7-8 on L, 3 column T8 vertebral body fx, T7 neural arch fx, R TVP T4-8 fxs, and R C7 TVP fx through foramen. R chest tube 4/1. Pt found to be hypoxic and was intubated 4/2. Received trach and PEG placement 4/18. S/p L chest tube placement 4/19. PMH: HTN, COPD, CAD, s/p MI with DES RCA in Jan 2020 just on ASA, depression/anxiety, and BPH.      SLP Plan  Continue with current plan of care       Recommendations  Diet recommendations: Dysphagia 2 (fine chop);Thin liquid Liquids provided via: Straw Medication Administration: Whole meds with puree Supervision: Full supervision/cueing for compensatory strategies  (moving toward self feeding) Compensations: Minimize environmental distractions;Slow rate;Small sips/bites;Lingual sweep for clearance of pocketing Postural Changes and/or Swallow Maneuvers:  (60)      Patient may use Passy-Muir Speech Valve: During all waking hours (remove during sleep) PMSV Supervision: Full         Oral Care Recommendations: Oral care QID Follow up Recommendations: Skilled Nursing facility;24 hour supervision/assistance SLP Visit Diagnosis: Dysphagia, oropharyngeal phase (R13.12) Plan: Continue with current plan of care                       [name] 09/27/2020, 11:32 AM  [name].Ed Nurse, children's [phone] Office [phone]

## 2020-09-28 LAB — GLUCOSE, CAPILLARY
Glucose-Capillary: 125 mg/dL — ABNORMAL HIGH (ref 70–99)
Glucose-Capillary: 133 mg/dL — ABNORMAL HIGH (ref 70–99)
Glucose-Capillary: 133 mg/dL — ABNORMAL HIGH (ref 70–99)
Glucose-Capillary: 141 mg/dL — ABNORMAL HIGH (ref 70–99)
Glucose-Capillary: 145 mg/dL — ABNORMAL HIGH (ref 70–99)
Glucose-Capillary: 185 mg/dL — ABNORMAL HIGH (ref 70–99)

## 2020-09-28 NOTE — Progress Notes (Signed)
  Speech Language Pathology Treatment: Dysphagia;Passy Muir Speaking valve  Patient Details Name: Charles Weeks MRN: [id number] DOB: 10/14/1965 Today's Date: 09/28/2020 Time: 0998-3382 SLP Time Calculation (min) (ACUTE ONLY): 40 min  Assessment / Plan / Recommendation Clinical Impression  Intervention this morning with speaking valve and back brace donned, centered around functional activity during breakfast. Lisandro's affect has been brighter, more alert and cognition is improving (he was inquiring about his specific meds with RN). Fed himself requiring assist to manipulate utensils,packages, cutting and sometimes scooping. Mastication was mildly prolonged without significant residue. Pharyngeal integrity challenged with sequential straw sips thin with PMV in place. Respirations were not increased and coordinated with swallows; no cough or wet vocal quality. Sats 89% on arrival and improved to 91-93% once able to clear pharyngeal mucous.  Continue DYs 2 texture, thin liquids w/ straw, pills whole in puree, PMV donned and full assist for manipulation of tray,  Feeding and strategies.    HPI HPI: Pt is a 55 y.o. male who presented 4/1 after falling down about 11 stairs (sister reported fell over a half wall from second floor) and sustained R PTX, anterior subluxation of R mandibular condyle, multiple bil ribs fxs at junction of thoracic vertebral bodies T5-8 on R and T7-8 on L, 3 column T8 vertebral body fx, T7 neural arch fx, R TVP T4-8 fxs, and R C7 TVP fx through foramen. R chest tube 4/1. Pt found to be hypoxic and was intubated 4/2. Received trach and PEG placement 4/18. S/p L chest tube placement 4/19. PMH: HTN, COPD, CAD, s/p MI with DES RCA in Jan 2020 just on ASA, depression/anxiety, and BPH.      SLP Plan  Continue with current plan of care       Recommendations  Diet recommendations: Dysphagia 2 (fine chop);Thin liquid Liquids provided via: Straw Medication Administration: Whole meds  with puree Supervision: Patient able to self feed;Staff to assist with self feeding;Full supervision/cueing for compensatory strategies Compensations: Minimize environmental distractions;Slow rate;Small sips/bites;Lingual sweep for clearance of pocketing Postural Changes and/or Swallow Maneuvers: Seated upright 90 degrees      Patient may use Passy-Muir Speech Valve: During all waking hours (remove during sleep) PMSV Supervision: Full MD: Please consider changing trach tube to : Cuffless         Oral Care Recommendations: Oral care BID Follow up Recommendations: Inpatient Rehab SLP Visit Diagnosis: Dysphagia, oropharyngeal phase (R13.12);Aphonia (R49.1) Plan: Continue with current plan of care                       Charles Weeks 09/28/2020, 1:21 PM   Charles Weeks Lonell Face.Ed Nurse, children's [phone] Office [phone]

## 2020-09-28 NOTE — Progress Notes (Signed)
Physical Therapy Treatment Patient Details Name: Charles Weeks MRN: [id number] DOB: 10-19-1965 Today's Date: 09/28/2020    History of Present Illness 55 y.o. male who presented 4/1 after falling down about 11 stairs and sustained R PTX, anterior subluxation of R mandibular condyle, multiple bil ribs fxs at junction of thoracic vertebral bodies T5-8 on R and T7-8 on L, 3 column T8 vertebral body fx, T7 neural arch fx, R TVP T4-8 fxs, and R C7 TVP fx through foramen. R chest tube inserted 4/1. Pt found to be hypoxic and was intubated 4/2. No evidence of vascular injury to neck, facial fx, acute intracranial injury, lumbar spine fx, R elbow acute abnormality, or pelvic fx identified on imaging. S/p trach and PEG placement 4/18. S/p L chest tube placement 4/19. PMH: HTN, COPD, CAD, s/p MI with DES RCA in Jan 2020 just on ASA, depression/anxiety, and BPH.    PT Comments    The pt continues to demo great progress with mobility and activity tolerance this session. He was able to complete rolling in bed for donning of brace with minG and verbal cues only. He then required minA to complete bed mobility, with increased time, effort, and sequential cues. The pt was able to complete multiple bouts of sit-stand transfers with progression from minA to minG with repeated cues for hand positioning and posture as well as multiple short bouts of ambulation in the room. The pt requires repeated cues for positioning in RW, and modA to manage RW and to prevent posterior LOB. Cues for slowing of movement and cues to stop and regain balance used frequently through session. The pt required 10L on 60% FiO2 through session with drop to 87% with ambulation, but recovery to 91-94% with seated rest. Continue to recommend CIR level therapies at d/c.     Follow Up Recommendations  CIR     Equipment Recommendations  Wheelchair (measurements PT);Wheelchair cushion (measurements PT);Hospital bed;Rolling walker with 5" wheels;3in1  (PT)    Recommendations for Other Services       Precautions / Restrictions Precautions Precautions: Cervical;Back Precaution Booklet Issued: No Precaution Comments: trach collar, condom cath, TLSO done with rolling if hOB >30 , ccollar at all times, abdominal binder to protect PEG Required Braces or Orthoses: Cervical Brace;Spinal Brace Cervical Brace: Hard collar;At all times Spinal Brace: Thoracolumbosacral orthotic;Applied in supine position Restrictions Weight Bearing Restrictions: No    Mobility  Bed Mobility Overal bed mobility: Needs Assistance Bed Mobility: Rolling;Sidelying to Sit Rolling: Min guard Sidelying to sit: Min assist       General bed mobility comments: minG to complete roll with minA to complete movements of BLE and to elevate trunk from [facility]    Transfers Overall transfer level: Needs assistance Equipment used: Rolling walker (2 wheeled) Transfers: Sit to/from Stand Sit to Stand: Min assist;Min guard         General transfer comment: pt progressed from minA from EOB to minG from EOB, but was able to progress to multiple reps from recliner with minG. continued verbal cues for hand positioning.  Ambulation/Gait Ambulation/Gait assistance: Mod assist;Min assist Gait Distance (Feet): 5 Feet (+ 34ft) Assistive device: Rolling walker (2 wheeled) Gait Pattern/deviations: Step-to pattern;Decreased stride length;Trunk flexed;Narrow base of support Gait velocity: decreased Gait velocity interpretation: <1.31 ft/sec, indicative of household ambulator General Gait Details: cues for sequencing/feet positioning. increased challenge with backwards stepping despite cues. modA at times to correct balance as pt frequently moving outside support of RW     Balance Overall balance  assessment: Needs assistance Sitting-balance support: Single extremity supported Sitting balance-Leahy Scale: Poor Sitting balance - Comments: Reliant on single UE support-bilateral UE  support. Close supervision for safety Postural control: Right lateral lean Standing balance support: Bilateral upper extremity supported Standing balance-Leahy Scale: Poor Standing balance comment: Pt requires BUE support for stability with intermittent min-modA to steady once he has lost balance. poor safety awareness results in frequently letting go of UE support requiring min-modA to steady                            Cognition Arousal/Alertness: Awake/alert Behavior During Therapy: Flat affect Overall Cognitive Status: Impaired/Different from baseline Area of Impairment: Following commands;Awareness               Rancho Levels of Cognitive Functioning Rancho Los Amigos Scales of Cognitive Functioning: Confused/appropriate   Current Attention Level: Sustained (for short bouts with cues) Memory: Decreased short-term memory;Decreased recall of precautions Following Commands: Follows multi-step commands inconsistently Safety/Judgement: Decreased awareness of safety;Decreased awareness of deficits Awareness: Intellectual Problem Solving: Slow processing General Comments: pt answering questions multiple times through session, needing repeated cues for safety, use of DME, spinal precautions, and technique through session. little carryover from prior session      Exercises General Exercises - Lower Extremity Long Arc Quad: Both;10 reps;Seated Heel Raises: AROM;Both;15 reps;Seated Other Exercises Other Exercises: total (A) to don brace TLSO Other Exercises: sit-stand from recliner 2  sets of 5    General Comments General comments (skin integrity, edema, etc.): VSS with pt on 60% FiO2 on trach collar      Pertinent Vitals/Pain Pain Assessment: Faces Faces Pain Scale: Hurts little more Pain Location: generalized Pain Descriptors / Indicators: Discomfort Pain Intervention(s): Limited activity within patient's tolerance;Monitored during session;Repositioned            PT Goals (current goals can now be found in the care plan section) Acute Rehab PT Goals Patient Stated Goal: did not state PT Goal Formulation: Patient unable to participate in goal setting Time For Goal Achievement: 10/03/20 Potential to Achieve Goals: Fair Progress towards PT goals: Progressing toward goals    Frequency    Min 4X/week      PT Plan Current plan remains appropriate       AM-PAC PT "6 Clicks" Mobility   Outcome Measure  Help needed turning from your back to your side while in a flat bed without using bedrails?: A Little Help needed moving from lying on your back to sitting on the side of a flat bed without using bedrails?: A Lot Help needed moving to and from a bed to a chair (including a wheelchair)?: A Lot Help needed standing up from a chair using your arms (e.g., wheelchair or bedside chair)?: A Little Help needed to walk in hospital room?: A Lot Help needed climbing 3-5 steps with a railing? : Total 6 Click Score: 13    End of Session Equipment Utilized During Treatment: Gait belt;Back brace;Cervical collar;Oxygen Activity Tolerance: Patient tolerated treatment well Patient left: in chair;with call bell/phone within reach;with chair alarm set;with restraints reapplied Nurse Communication: Mobility status PT Visit Diagnosis: Muscle weakness (generalized) (M62.81);Difficulty in walking, not elsewhere classified (R26.2);Unsteadiness on feet (R26.81);Other abnormalities of gait and mobility (R26.89)     Time: 1555-1630 PT Time Calculation (min) (ACUTE ONLY): 35 min  Charges:  $Gait Training: 8-22 mins $Therapeutic Activity: 8-22 mins  Deland Pretty, DPT   Acute Rehabilitation Department Pager #: [phone]    Gaetana Michaelis 09/28/2020, 4:43 PM

## 2020-09-28 NOTE — Progress Notes (Signed)
Patient ID: Charles Weeks, male   DOB: [birth year], 55 y.o.   MRN: [id number] Follow up - Trauma Critical Care  Patient Details:    Charles Weeks is an 55 y.o. male.  Lines/tubes : Gastrostomy/Enterostomy Percutaneous endoscopic gastrostomy (PEG) 20 Fr. LUQ (Active)  Surrounding Skin Dry;Intact 09/27/20 2000  Tube Status Patent 09/27/20 2000  Drainage Appearance None 09/26/20 2000  Dressing Status Clean;Intact;Dry 09/27/20 2000  Dressing Intervention Dressing reinforced 09/24/20 2000  Dressing Type Abdominal Binder 09/27/20 2000  Dressing Change Due 09/27/20 09/26/20 2000  G Port Intake (mL) 100 ml 09/23/20 1600  Output (mL) 0 mL 09/23/20 1000     External Urinary Catheter (Active)  Collection Container Standard drainage bag 09/26/20 2000  Suction (Verified suction is between 40-80 mmHg) N/A (Patient has condom catheter) 09/26/20 0835  Securement Method Securing device (Describe) 09/26/20 2000  Site Assessment Clean;Intact 09/27/20 2000  Intervention Equipment Changed 09/25/20 1800  Output (mL) 0 mL 09/26/20 1649    Microbiology/Sepsis markers: Results for orders placed or performed during the hospital encounter of 08/17/20  Resp Panel by RT-PCR (Flu A&B, Covid) Nasopharyngeal Swab     Status: None   Collection Time: 08/17/20  3:40 PM   Specimen: Nasopharyngeal Swab; Nasopharyngeal(NP) swabs in vial transport medium  Result Value Ref Range Status   SARS Coronavirus 2 by RT PCR NEGATIVE NEGATIVE Final    Comment: (NOTE) SARS-CoV-2 target nucleic acids are NOT DETECTED.  The SARS-CoV-2 RNA is generally detectable in upper respiratory specimens during the acute phase of infection. The lowest concentration of SARS-CoV-2 viral copies this assay can detect is 138 copies/mL. A negative result does not preclude SARS-Cov-2 infection and should not be used as the sole basis for treatment or other patient management decisions. A negative result may occur with  improper specimen  collection/handling, submission of specimen other than nasopharyngeal swab, presence of viral mutation(s) within the areas targeted by this assay, and inadequate number of viral copies(<138 copies/mL). A negative result must be combined with clinical observations, patient history, and epidemiological information. The expected result is Negative.  Fact Sheet for Patients:  BloggerCourse.com  Fact Sheet for Healthcare Providers:  SeriousBroker.it  This test is no t yet approved or cleared by the [place] FDA and  has been authorized for detection and/or diagnosis of SARS-CoV-2 by FDA under an Emergency Use Authorization (EUA). This EUA will remain  in effect (meaning this test can be used) for the duration of the COVID-19 declaration under Section 564(b)(1) of the Act, 21 U.S.C.section 360bbb-3(b)(1), unless the authorization is terminated  or revoked sooner.       Influenza A by PCR NEGATIVE NEGATIVE Final   Influenza B by PCR NEGATIVE NEGATIVE Final    Comment: (NOTE) The Xpert Xpress SARS-CoV-2/FLU/RSV plus assay is intended as an aid in the diagnosis of influenza from Nasopharyngeal swab specimens and should not be used as a sole basis for treatment. Nasal washings and aspirates are unacceptable for Xpert Xpress SARS-CoV-2/FLU/RSV testing.  Fact Sheet for Patients: BloggerCourse.com  Fact Sheet for Healthcare Providers: SeriousBroker.it  This test is not yet approved or cleared by the [place] FDA and has been authorized for detection and/or diagnosis of SARS-CoV-2 by FDA under an Emergency Use Authorization (EUA). This EUA will remain in effect (meaning this test can be used) for the duration of the COVID-19 declaration under Section 564(b)(1) of the Act, 21 U.S.C. section 360bbb-3(b)(1), unless the authorization is terminated or revoked.  Performed at [facility]  Lab, [address] [place], [place] [address]   Surgical PCR screen     Status: None   Collection Time: 08/19/20  7:38 AM   Specimen: Nasal Mucosa; Nasal Swab  Result Value Ref Range Status   MRSA, PCR NEGATIVE NEGATIVE Final   Staphylococcus aureus NEGATIVE NEGATIVE Final    Comment: (NOTE) The Xpert SA Assay (FDA approved for NASAL specimens in patients 32 years of age and older), is one component of a comprehensive surveillance program. It is not intended to diagnose infection nor to guide or monitor treatment. Performed at [facility], [address], [place], [place] [address]   MRSA PCR Screening     Status: None   Collection Time: 09/19/20  7:40 AM   Specimen: Nasal Mucosa; Nasopharyngeal  Result Value Ref Range Status   MRSA by PCR NEGATIVE NEGATIVE Final    Comment:        The GeneXpert MRSA Assay (FDA approved for NASAL specimens only), is one component of a comprehensive MRSA colonization surveillance program. It is not intended to diagnose MRSA infection nor to guide or monitor treatment for MRSA infections. Performed at [facility], [address] [place], [place] [address]     Anti-infectives:  Anti-infectives (From admission, onward)   Start     Dose/Rate Route Frequency Ordered Stop   08/19/20 0845  cefTRIAXone (ROCEPHIN) 2 g in sodium chloride 0.9 % 100 mL IVPB  Status:  Discontinued        2 g 200 mL/hr over 30 Minutes Intravenous Every 24 hours 08/19/20 0753 08/25/20 1304   08/17/20 1515  ceFAZolin (ANCEF) IVPB 2g/100 mL premix        2 g 200 mL/hr over 30 Minutes Intravenous  Once 08/17/20 1500 08/17/20 1530    Consults: Treatment Team:  Tia Alert, MD    Studies:    Events:  Subjective:    Overnight Issues:   Objective:  Vital signs for last 24 hours: Temp:  [98.4 F (36.9 C)-98.9 F (37.2 C)] 98.5 F (36.9 C) (05/13 0800) Pulse Rate:  [82-113] 101 (05/13 0800) Resp:  [12-26] 12 (05/13 0800) BP:  (110-163)/(59-97) 145/79 (05/13 0800) SpO2:  [85 %-100 %] 96 % (05/13 0800) FiO2 (%):  [60 %] 60 % (05/13 0335)  Hemodynamic parameters for last 24 hours:    Intake/Output from previous day: 05/12 0701 - 05/13 0700 In: 1440 [NG/GT:1440] Out: 2475 [Urine:2475]  Intake/Output this shift: Total I/O In: 60 [NG/GT:60] Out: -   Vent settings for last 24 hours: Vent Mode: Stand-by FiO2 (%):  [60 %] 60 %  Physical Exam:  General: alert and no respiratory distress Neuro: alert and F/C HEENT/Neck: trach-clean, intact Resp: clear to auscultation bilaterally CVS: RRR GI: soft, NT, PEG Extremities: calves soft  Results for orders placed or performed during the hospital encounter of 08/17/20 (from the past 24 hour(s))  Glucose, capillary     Status: Abnormal   Collection Time: 09/27/20  4:08 PM  Result Value Ref Range   Glucose-Capillary 137 (H) 70 - 99 mg/dL  Glucose, capillary     Status: Abnormal   Collection Time: 09/27/20  7:57 PM  Result Value Ref Range   Glucose-Capillary 125 (H) 70 - 99 mg/dL  Glucose, capillary     Status: Abnormal   Collection Time: 09/27/20 11:37 PM  Result Value Ref Range   Glucose-Capillary 139 (H) 70 - 99 mg/dL  Glucose, capillary     Status: Abnormal  Collection Time: 09/28/20  4:11 AM  Result Value Ref Range   Glucose-Capillary 133 (H) 70 - 99 mg/dL  Glucose, capillary     Status: Abnormal   Collection Time: 09/28/20  8:11 AM  Result Value Ref Range   Glucose-Capillary 141 (H) 70 - 99 mg/dL   Comment 1 Notify RN    Comment 2 Document in Chart     Assessment & Plan: Present on Admission: . Thoracic spine fracture (HCC)    LOS: 42 days   Additional comments:I reviewed the patient's new clinical lab test results. . Fall from height  R PTX, B effusion Multiple B rib fxs- these are at the junction of the thoracic vertebral bodies, pain control 3 column T8 vertebral body fx-perDr. Jones,non-op now, plan mobilize in brace once off  vent. Needs CTO brace for HOB>30 degrees. T7 neural arch fx- per nsgy R TVP T4-8 - pain control, per nsgy R C7 TVP fx through foramen- C collar per Dr. Yetta Barre, CTAnegative Large scalp laceration/right eyebrow laceration- repair by Dr. Lovick4/05/2020, sutures removed Right neck abrasion/skin tear- bacitracin BID Facial ecchymosis- CT maxillofacialdemonstrates no facial bone fractures. Some anterior subluxation of the right mandibular condyle HTN- home lopressor 25mg  daily CAD/H/O MI/DES- on ASA at home. Hold for now. Monitor, on tele Acute hypercarbic ventilator dependent respiratory failure/COPD-Trach/PEG 4/18by Dr. 03-05-1974. HTC 24h now - wean FiO2 as able (60%) Rash on back- benadryl Depression/anxiety- home bupropion and buspar BPH- home terazosin, repeatedly failedvoiding trials,  bethanachol. I&O q6h ABL anemia- stable FEN-TF,bowel regimen, seroquel  VTE-LMWH Dispo- ICU;HTC 24/7, wean FiO2 as able so we can consider CIR (once FiO2 35%) Critical Care Total Time*: 32 Minutes  Janee Morn, MD, MPH, FACS Trauma & General Surgery Use [email or web address] to contact on call provider  09/28/2020  *Care during the described time interval was provided by me. I have reviewed this patient's available data, including medical history, events of note, physical examination and test results as part of my evaluation.

## 2020-09-28 NOTE — TOC Progression Note (Signed)
Transition of Care [facility]) - Progression Note    Patient Details  Name: Charles Weeks MRN: [id number] Date of Birth: 06-Oct-1965  [facility] CM/SW Contact  Glennon Mac, RN Phone Number: 09/28/2020, 4:47 PM  Clinical Narrative:   Noted today's events, and patient making his sister POA.  PT/OT continue to recommend CIR; will follow for medical readiness for CIR.  Currently remains on 60% FIO2; will need to wean to 28-35% prior to being considered for rehab.  Will follow progress.     Expected Discharge Plan: IP Rehab Facility Barriers to Discharge: Family Issues,Continued Medical Work up,Insurance Authorization,No SNF bed  Expected Discharge Plan and Services Expected Discharge Plan: IP Rehab Facility In-house Referral: Clinical Social Work Discharge Planning Services: CM Consult Post Acute Care Choice: Skilled Nursing Facility Living arrangements for the past 2 months: Single Family Home                                       Social Determinants of Health (SDOH) Interventions    Readmission Risk Interventions No flowsheet data found.  Quintella Baton, RN, BSN  Trauma/Neuro ICU Case Manager [phone]

## 2020-09-28 NOTE — Progress Notes (Signed)
Nutrition Follow-up  DOCUMENTATION CODES:   Not applicable  INTERVENTION:   Encourage PO intake, not ready to adjust TF as pt is eating very little currently  Tube Feeding via PEG:  Pivot 1.5 at 60 ml/hr Add 45 ml ProSource daily   Provides 2200 kcals, 146g of protein, 1092 mL of free water  200 ml free water every 4 hours Total free water: 2292 ml   MVI with minerals daily   NUTRITION DIAGNOSIS:   Increased nutrient needs related to  (trauma) as evidenced by estimated needs. Ongoing.   GOAL:   Patient will meet greater than or equal to 90% of their needs Met with TF.   MONITOR:   Vent status,TF tolerance  REASON FOR ASSESSMENT:   Ventilator,Consult Enteral/tube feeding initiation and management  ASSESSMENT:   Pt with PMH of COPD followed by Dr Melvyn Novas, HTN, CAD s/p stent who was admitted after fall over 1/2 wall from the second story landing with multiple rib fxs, R pneumothorax, large scalp laceration, and T8 vertebral body fxs, T7, T4-8, C7 fxs.  Pt discussed during ICU rounds and with RN.  Spoke with pt, sister, and Therapist, sports. Pt had 6 bites of dinner last night. Worked with SLP for Breakfast this am. Continue full nutrition support for now, adjust as appropriate.   4/18 s/p trach and PEG 5/11 diet advanced to Dysphagia 2 with thin liquids  Medications reviewed and include: colace, folic acid, SSI, MVI with minerals, thiamine  Labs reviewed:  CBG's: 125-185  61F PEG  UOP: 2475 ml  I&O: +8 L  Diet Order:   Diet Order            DIET DYS 2 Room service appropriate? No; Fluid consistency: Thin  Diet effective now                 EDUCATION NEEDS:   No education needs have been identified at this time  Skin:  DTI: vertebral column   Last BM:  5/12  Height:   Ht Readings from Last 1 Encounters:  08/17/20 _0  (1.727 m)    Weight:   Wt Readings from Last 1 Encounters:  09/26/20 83.2 kg    Ideal Body Weight:     BMI:  Body mass index is  27.89 kg/m.  Estimated Nutritional Needs:   Kcal:  2000-2200  Protein:  120-135 grams  Fluid:  >2 L/day  Lockie Pares., RD, LDN, CNSC See AMiON for contact information

## 2020-09-28 NOTE — Progress Notes (Signed)
Attempted to wean ATC to 10L/40% per Dr Carollee Massed request, however, pt's spo2 dropped to 84%. Fio2 increased back to 60%.

## 2020-09-28 NOTE — Progress Notes (Signed)
This chaplain responded to the MD request for creating or updating the Pt. Advance Directive.  The chaplain was updated by the Pt. RN-Susannah before the visit.  The Pt. sister-[name] is at the bedside. [name] steps outside the room during AD education.  During Pt. HCPOA education the Pt. was able to articulate the reason for and relationhip with his HCPOA.  The Pt. desires to name the Pt. sister-[name] Hunt as his HCPOA.  The Pt. declines completing a Living Will at this time.  The original Advance Directive and one copy was given to the Pt. A copy was scanned into the Pt. EMR.  The chaplain is available for F/U spiritual care as needed.

## 2020-09-29 LAB — GLUCOSE, CAPILLARY
Glucose-Capillary: 122 mg/dL — ABNORMAL HIGH (ref 70–99)
Glucose-Capillary: 129 mg/dL — ABNORMAL HIGH (ref 70–99)
Glucose-Capillary: 151 mg/dL — ABNORMAL HIGH (ref 70–99)
Glucose-Capillary: 143 mg/dL — ABNORMAL HIGH (ref 70–99)
Glucose-Capillary: 149 mg/dL — ABNORMAL HIGH (ref 70–99)
Glucose-Capillary: 104 mg/dL — ABNORMAL HIGH (ref 70–99)

## 2020-09-29 NOTE — Progress Notes (Signed)
Patient ID: Charles Weeks, male   DOB: [birth year], 55 y.o.   MRN: [id number] Follow up - Trauma Critical Care  Patient Details:    Charles Weeks is an 55 y.o. male.  Lines/tubes : Gastrostomy/Enterostomy Percutaneous endoscopic gastrostomy (PEG) 20 Fr. LUQ (Active)  Surrounding Skin Dry;Intact 09/27/20 2000  Tube Status Patent 09/27/20 2000  Drainage Appearance None 09/26/20 2000  Dressing Status Clean;Intact;Dry 09/27/20 2000  Dressing Intervention Dressing reinforced 09/24/20 2000  Dressing Type Abdominal Binder 09/27/20 2000  Dressing Change Due 09/27/20 09/26/20 2000  G Port Intake (mL) 100 ml 09/23/20 1600  Output (mL) 0 mL 09/23/20 1000     External Urinary Catheter (Active)  Collection Container Standard drainage bag 09/26/20 2000  Suction (Verified suction is between 40-80 mmHg) N/A (Patient has condom catheter) 09/26/20 0835  Securement Method Securing device (Describe) 09/26/20 2000  Site Assessment Clean;Intact 09/27/20 2000  Intervention Equipment Changed 09/25/20 1800  Output (mL) 0 mL 09/26/20 1649    Microbiology/Sepsis markers: Results for orders placed or performed during the hospital encounter of 08/17/20  Resp Panel by RT-PCR (Flu A&B, Covid) Nasopharyngeal Swab     Status: None   Collection Time: 08/17/20  3:40 PM   Specimen: Nasopharyngeal Swab; Nasopharyngeal(NP) swabs in vial transport medium  Result Value Ref Range Status   SARS Coronavirus 2 by RT PCR NEGATIVE NEGATIVE Final    Comment: (NOTE) SARS-CoV-2 target nucleic acids are NOT DETECTED.  The SARS-CoV-2 RNA is generally detectable in upper respiratory specimens during the acute phase of infection. The lowest concentration of SARS-CoV-2 viral copies this assay can detect is 138 copies/mL. A negative result does not preclude SARS-Cov-2 infection and should not be used as the sole basis for treatment or other patient management decisions. A negative result may occur with  improper specimen  collection/handling, submission of specimen other than nasopharyngeal swab, presence of viral mutation(s) within the areas targeted by this assay, and inadequate number of viral copies(<138 copies/mL). A negative result must be combined with clinical observations, patient history, and epidemiological information. The expected result is Negative.  Fact Sheet for Patients:  BloggerCourse.com  Fact Sheet for Healthcare Providers:  SeriousBroker.it  This test is no t yet approved or cleared by the [place] FDA and  has been authorized for detection and/or diagnosis of SARS-CoV-2 by FDA under an Emergency Use Authorization (EUA). This EUA will remain  in effect (meaning this test can be used) for the duration of the COVID-19 declaration under Section 564(b)(1) of the Act, 21 U.S.C.section 360bbb-3(b)(1), unless the authorization is terminated  or revoked sooner.       Influenza A by PCR NEGATIVE NEGATIVE Final   Influenza B by PCR NEGATIVE NEGATIVE Final    Comment: (NOTE) The Xpert Xpress SARS-CoV-2/FLU/RSV plus assay is intended as an aid in the diagnosis of influenza from Nasopharyngeal swab specimens and should not be used as a sole basis for treatment. Nasal washings and aspirates are unacceptable for Xpert Xpress SARS-CoV-2/FLU/RSV testing.  Fact Sheet for Patients: BloggerCourse.com  Fact Sheet for Healthcare Providers: SeriousBroker.it  This test is not yet approved or cleared by the [place] FDA and has been authorized for detection and/or diagnosis of SARS-CoV-2 by FDA under an Emergency Use Authorization (EUA). This EUA will remain in effect (meaning this test can be used) for the duration of the COVID-19 declaration under Section 564(b)(1) of the Act, 21 U.S.C. section 360bbb-3(b)(1), unless the authorization is terminated or revoked.  Performed at [facility]  Lab, [address] [place], [place] [address]   Surgical PCR screen     Status: None   Collection Time: 08/19/20  7:38 AM   Specimen: Nasal Mucosa; Nasal Swab  Result Value Ref Range Status   MRSA, PCR NEGATIVE NEGATIVE Final   Staphylococcus aureus NEGATIVE NEGATIVE Final    Comment: (NOTE) The Xpert SA Assay (FDA approved for NASAL specimens in patients 59 years of age and older), is one component of a comprehensive surveillance program. It is not intended to diagnose infection nor to guide or monitor treatment. Performed at [facility], [address] [place], [place] [address]   MRSA PCR Screening     Status: None   Collection Time: 09/19/20  7:40 AM   Specimen: Nasal Mucosa; Nasopharyngeal  Result Value Ref Range Status   MRSA by PCR NEGATIVE NEGATIVE Final    Comment:        The GeneXpert MRSA Assay (FDA approved for NASAL specimens only), is one component of a comprehensive MRSA colonization surveillance program. It is not intended to diagnose MRSA infection nor to guide or monitor treatment for MRSA infections. Performed at [facility], [address], [place], [place] [address]     Anti-infectives:  Anti-infectives (From admission, onward)   Start     Dose/Rate Route Frequency Ordered Stop   08/19/20 0845  cefTRIAXone (ROCEPHIN) 2 g in sodium chloride 0.9 % 100 mL IVPB  Status:  Discontinued        2 g 200 mL/hr over 30 Minutes Intravenous Every 24 hours 08/19/20 0753 08/25/20 1304   08/17/20 1515  ceFAZolin (ANCEF) IVPB 2g/100 mL premix        2 g 200 mL/hr over 30 Minutes Intravenous  Once 08/17/20 1500 08/17/20 1530    Consults: Treatment Team:  Tia Alert, MD    Studies:    Events:  Subjective:    Overnight Issues:   Objective:  Vital signs for last 24 hours: Temp:  [97.7 F (36.5 C)-99.6 F (37.6 C)] 99.1 F (37.3 C) (05/14 0800) Pulse Rate:  [79-106] 96 (05/14 0900) Resp:  [10-21] 13 (05/14 0900) BP:  (97-151)/(61-93) 133/91 (05/14 0900) SpO2:  [88 %-100 %] 97 % (05/14 0900) FiO2 (%):  [60 %] 60 % (05/14 0748) Weight:  [82.8 kg-83.5 kg] 82.8 kg (05/14 0405)  Hemodynamic parameters for last 24 hours:    Intake/Output from previous day: 05/13 0701 - 05/14 0700 In: 4581 [P.O.:1; NG/GT:4580] Out: 3400 [Urine:3400]  Intake/Output this shift: No intake/output data recorded.  Vent settings for last 24 hours: FiO2 (%):  [60 %] 60 %  Physical Exam:  General: alert and no respiratory distress Neuro: alert and F/C HEENT/Neck: trach-clean, intact Resp: clear to auscultation bilaterally CVS: RRR GI: soft, NT, PEG Extremities: calves soft  Results for orders placed or performed during the hospital encounter of 08/17/20 (from the past 24 hour(s))  Glucose, capillary     Status: Abnormal   Collection Time: 09/28/20 12:54 PM  Result Value Ref Range   Glucose-Capillary 185 (H) 70 - 99 mg/dL   Comment 1 Notify RN    Comment 2 Document in Chart   Glucose, capillary     Status: Abnormal   Collection Time: 09/28/20  4:00 PM  Result Value Ref Range   Glucose-Capillary 133 (H) 70 - 99 mg/dL   Comment 1 Notify RN    Comment 2 Document in Chart   Glucose, capillary     Status:  Abnormal   Collection Time: 09/28/20  7:35 PM  Result Value Ref Range   Glucose-Capillary 125 (H) 70 - 99 mg/dL  Glucose, capillary     Status: Abnormal   Collection Time: 09/28/20 11:30 PM  Result Value Ref Range   Glucose-Capillary 145 (H) 70 - 99 mg/dL  Glucose, capillary     Status: Abnormal   Collection Time: 09/29/20  3:40 AM  Result Value Ref Range   Glucose-Capillary 122 (H) 70 - 99 mg/dL  Glucose, capillary     Status: Abnormal   Collection Time: 09/29/20  8:52 AM  Result Value Ref Range   Glucose-Capillary 129 (H) 70 - 99 mg/dL    Assessment & Plan: Present on Admission: . Thoracic spine fracture (HCC)    LOS: 43 days   Additional comments:I reviewed the patient's new clinical lab test  results. . Fall from height  R PTX, B effusion Multiple B rib fxs- these are at the junction of the thoracic vertebral bodies, pain control 3 column T8 vertebral body fx-perDr. Jones,non-op now, plan mobilize in brace once off vent. Needs CTO brace for HOB>30 degrees. T7 neural arch fx- per nsgy R TVP T4-8 - pain control, per nsgy R C7 TVP fx through foramen- C collar per Dr. Yetta Barre, CTAnegative Large scalp laceration/right eyebrow laceration- repair by Dr. Lovick4/05/2020, sutures removed Right neck abrasion/skin tear- bacitracin BID Facial ecchymosis- CT maxillofacialdemonstrates no facial bone fractures. Some anterior subluxation of the right mandibular condyle HTN- home lopressor 25mg  daily CAD/H/O MI/DES- on ASA at home. Hold for now. Monitor, on tele Acute hypercarbic ventilator dependent respiratory failure/COPD-Trach/PEG 4/18by Dr. 03-05-1974. HTC 24h now - wean FiO2 as able (60% at the moment) Rash on back- benadryl Depression/anxiety- home bupropion and buspar BPH- home terazosin, repeatedly failedvoiding trials,  bethanachol. I&O q6h ABL anemia- stable FEN-TF,bowel regimen, seroquel  VTE-[facility] Dispo- ICU;HTC 24/7, wean FiO2 as able so we can consider CIR (once FiO2 35%) Critical Care Total Time*: 30 Minutes  Janee Morn, MD [facility] Use [email or web address] to contact on call provider  09/29/2020  *Care during the described time interval was provided by me. I have reviewed this patient's available data, including medical history, events of note, physical examination and test results as part of my evaluation.

## 2020-09-30 LAB — GLUCOSE, CAPILLARY
Glucose-Capillary: 116 mg/dL — ABNORMAL HIGH (ref 70–99)
Glucose-Capillary: 118 mg/dL — ABNORMAL HIGH (ref 70–99)
Glucose-Capillary: 123 mg/dL — ABNORMAL HIGH (ref 70–99)
Glucose-Capillary: 137 mg/dL — ABNORMAL HIGH (ref 70–99)
Glucose-Capillary: 152 mg/dL — ABNORMAL HIGH (ref 70–99)
Glucose-Capillary: 167 mg/dL — ABNORMAL HIGH (ref 70–99)

## 2020-09-30 MED ORDER — TAMSULOSIN HCL 0.4 MG PO CAPS
0.4000 mg | ORAL_CAPSULE | Freq: Every day | ORAL | Status: DC
  Administered 2020-09-30 – 2020-10-09 (×10): 0.4 mg via ORAL
  Filled 2020-09-30 (×11): qty 1

## 2020-09-30 MED ORDER — TAMSULOSIN HCL 0.4 MG PO CAPS: 0.8000 mg | ORAL_CAPSULE | Freq: Every day | ORAL | Status: DC

## 2020-09-30 MED ORDER — POLYETHYLENE GLYCOL 3350 17 G PO PACK
17.0000 g | PACK | Freq: Every day | ORAL | Status: DC
  Administered 2020-09-30 – 2020-10-09 (×9): 17 g via ORAL
  Filled 2020-09-30 (×9): qty 1

## 2020-09-30 MED ORDER — ORAL CARE MOUTH RINSE
15.0000 mL | Freq: Two times a day (BID) | OROMUCOSAL | Status: DC
  Administered 2020-09-30 – 2020-10-09 (×19): 15 mL via OROMUCOSAL

## 2020-09-30 NOTE — Progress Notes (Signed)
Per MD order, Charles Weeks changed to #6 uncuffed Shiley Flex with no complications. Positive color change noted on etco2. Pt has strong productive cough and able to vocalize with PM post trach change.

## 2020-09-30 NOTE — Progress Notes (Signed)
Trauma/Critical Care Follow Up Note  Subjective:    Overnight Issues:   Objective:  Vital signs for last 24 hours: Temp:  [98.8 F (37.1 C)-100.3 F (37.9 C)] 99.1 F (37.3 C) (05/15 0400) Pulse Rate:  [79-196] 90 (05/15 0800) Resp:  [9-26] 12 (05/15 0800) BP: (109-137)/(64-91) 122/76 (05/15 0800) SpO2:  [82 %-99 %] 89 % (05/15 0800) FiO2 (%):  [60 %-80 %] 60 % (05/15 0800) Weight:  [84.2 kg] 84.2 kg (05/15 0400)  Hemodynamic parameters for last 24 hours:    Intake/Output from previous day: 05/14 0701 - 05/15 0700 In: 2360 [P.O.:120; NG/GT:2240] Out: 2600 [Urine:2600]  Intake/Output this shift: Total I/O In: 60 [NG/GT:60] Out: -   Vent settings for last 24 hours: FiO2 (%):  [60 %-80 %] 60 %  Physical Exam:  Gen: comfortable, no distress Neuro: non-focal exam HEENT: PERRL Neck: supple CV: RRR Pulm: unlabored breathing on TC Abd: soft, NT GU: clear yellow urine Extr: wwp, no edema   Results for orders placed or performed during the hospital encounter of 08/17/20 (from the past 24 hour(s))  Glucose, capillary     Status: Abnormal   Collection Time: 09/29/20  8:52 AM  Result Value Ref Range   Glucose-Capillary 129 (H) 70 - 99 mg/dL  Glucose, capillary     Status: Abnormal   Collection Time: 09/29/20 12:24 PM  Result Value Ref Range   Glucose-Capillary 151 (H) 70 - 99 mg/dL  Glucose, capillary     Status: Abnormal   Collection Time: 09/29/20  3:39 PM  Result Value Ref Range   Glucose-Capillary 143 (H) 70 - 99 mg/dL  Glucose, capillary     Status: Abnormal   Collection Time: 09/29/20  7:40 PM  Result Value Ref Range   Glucose-Capillary 149 (H) 70 - 99 mg/dL  Glucose, capillary     Status: Abnormal   Collection Time: 09/29/20 11:29 PM  Result Value Ref Range   Glucose-Capillary 104 (H) 70 - 99 mg/dL  Glucose, capillary     Status: Abnormal   Collection Time: 09/30/20  3:26 AM  Result Value Ref Range   Glucose-Capillary 137 (H) 70 - 99 mg/dL   Glucose, capillary     Status: Abnormal   Collection Time: 09/30/20  8:10 AM  Result Value Ref Range   Glucose-Capillary 123 (H) 70 - 99 mg/dL    Assessment & Plan: The plan of care was discussed with the bedside nurse for the day, Jen L, who is in agreement with this plan and no additional concerns were raised.   Present on Admission: . Thoracic spine fracture (HCC)    LOS: 44 days   Additional comments:I reviewed the patient's new clinical lab test results.   and I reviewed the patients new imaging test results.    Fall from height  R PTX, B effusion Multiple B rib fxs- these are at the junction of the thoracic vertebral bodies, pain control 3 column T8 vertebral body fx-perDr. Jones,non-op now, plan mobilize in brace once off vent. Needs CTO brace for HOB>30 degrees. Will discuss timing of lifting restrictions T7 neural arch fx- per nsgy R TVP T4-8 - pain control, per nsgy R C7 TVP fx through foramen- C collar per Dr. Yetta Barre, CTAnegative Large scalp laceration/right eyebrow laceration- repair by Dr. Lovick4/05/2020, sutures removed Right neck abrasion/skin tear- bacitracin BID Facial ecchymosis- CT maxillofacialdemonstrates no facial bone fractures. Some anterior subluxation of the right mandibular condyle HTN- home lopressor 25mg  daily CAD/H/O MI/DES- on ASA at  home. Hold for now. Monitor, on tele Acute hypercarbic ventilator dependent respiratory failure/COPD-Trach/PEG 4/18by Dr. Janee Morn. HTC 24h now - wean FiO2 as able (60%). O2 req'ts and secretions too significant to be able to safely transfer out of ICU. Change to [facility] trach today in hopes that secretions will be reduced by avoiding tracheal irritation from the balloon.  Rash on back- benadryl Depression/anxiety- home bupropion and buspar BPH- home terazosin, repeatedly failedvoiding trials,  bethanachol. Actually give the home flomax as ordered now that he is taking PO. Cont I&O q6h ABL anemia-  stable FEN-no BM x3, add miralax, change TF to 75% total needs and nocturnal to encourage PO intake  [facility] Dispo- ICU;HTC 24/7, wean FiO2 as able so we can consider CIR (once FiO2 35%)  Critical Care Total Time: 35 minutes  Diamantina Monks, MD Trauma & General Surgery Please use [email or web address] to contact on call provider  09/30/2020  *Care during the described time interval was provided by me. I have reviewed this patient's available data, including medical history, events of note, physical examination and test results as part of my evaluation.

## 2020-09-30 NOTE — Progress Notes (Signed)
Patient's sister, [name], called for an update this morning. During phone call, she requested a neurosurgery consult for paralysis in right arm and a urology consult for requirement of I&O. I spoke with Dr. Bedelia Person who is aware.

## 2020-10-01 LAB — GLUCOSE, CAPILLARY
Glucose-Capillary: 100 mg/dL — ABNORMAL HIGH (ref 70–99)
Glucose-Capillary: 123 mg/dL — ABNORMAL HIGH (ref 70–99)
Glucose-Capillary: 125 mg/dL — ABNORMAL HIGH (ref 70–99)
Glucose-Capillary: 140 mg/dL — ABNORMAL HIGH (ref 70–99)
Glucose-Capillary: 144 mg/dL — ABNORMAL HIGH (ref 70–99)
Glucose-Capillary: 154 mg/dL — ABNORMAL HIGH (ref 70–99)

## 2020-10-01 MED ORDER — PROSOURCE TF PO LIQD
45.0000 mL | Freq: Three times a day (TID) | ORAL | Status: DC
  Administered 2020-10-01 – 2020-10-03 (×9): 45 mL
  Filled 2020-10-01 (×8): qty 45

## 2020-10-01 MED ORDER — ENSURE ENLIVE PO LIQD
237.0000 mL | Freq: Two times a day (BID) | ORAL | Status: DC
  Administered 2020-10-01 – 2020-10-04 (×6): 237 mL via ORAL

## 2020-10-01 MED ORDER — PIVOT 1.5 CAL PO LIQD
960.0000 mL | ORAL | Status: DC
  Administered 2020-10-02 – 2020-10-03 (×2): 960 mL
  Filled 2020-10-01 (×6): qty 1000

## 2020-10-01 MED ORDER — DIPHENHYDRAMINE HCL 25 MG PO CAPS
25.0000 mg | ORAL_CAPSULE | Freq: Four times a day (QID) | ORAL | Status: DC | PRN
  Administered 2020-10-01 – 2020-10-06 (×2): 25 mg
  Filled 2020-10-01 (×2): qty 1

## 2020-10-01 NOTE — Progress Notes (Signed)
Patient requested to be intermittent catheterized at 1930 due to feeling like he needed to urinate. I mentioned he was recently catheterized at 1700 but he said he needs to go again. He attempted to use the urinal but was unsuccessful. 550 cc urine drained from bladder. Patient feels much better.

## 2020-10-01 NOTE — Progress Notes (Addendum)
Nutrition Follow-up  DOCUMENTATION CODES:   Not applicable  INTERVENTION:   Nocturnal Tube Feeding via PEG:  Pivot 1.5 at 80 ml/hr x 12 hours  Add 45 ml ProSource daily   Provides 1560 kcals, 123g of protein, 728 ml free water  200 ml free water every 4 hours Total free water: 1928 ml   MVI with minerals daily   Encourage PO intake  Ensure Enlive po BID, each supplement provides 350 kcal and 20 grams of protein  Magic cup TID with meals, each supplement provides 290 kcal and 9 grams of protein   NUTRITION DIAGNOSIS:   Increased nutrient needs related to  (trauma) as evidenced by estimated needs. Ongoing.   GOAL:   Patient will meet greater than or equal to 90% of their needs Met with TF.   MONITOR:   Vent status,TF tolerance  REASON FOR ASSESSMENT:   Ventilator,Consult Enteral/tube feeding initiation and management  ASSESSMENT:   Pt with PMH of COPD followed by Dr Melvyn Novas, HTN, CAD s/p stent who was admitted after fall over 1/2 wall from the second story landing with multiple rib fxs, R pneumothorax, large scalp laceration, and T8 vertebral body fxs, T7, T4-8, C7 fxs.  Pt discussed during ICU rounds and with RN.  Spoke with pt, sister, and Therapist, sports. Pt had 6 bites of dinner last night. Worked with SLP for Breakfast this am. Continue full nutrition support for now, adjust as appropriate.   4/18 s/p trach and PEG 5/11 diet advanced to Dysphagia 2 with thin liquids  Medications reviewed and include: colace, folic acid, SSI, MVI with minerals, miralax, thiamine  Labs reviewed:  CBG's: 116-152  42F PEG  UOP: 2300 ml  I&O: +8 L  Diet Order:   Diet Order            DIET DYS 2 Room service appropriate? No; Fluid consistency: Thin  Diet effective now                 EDUCATION NEEDS:   No education needs have been identified at this time  Skin:  DTI: vertebral column   Last BM:  5/12  Height:   Ht Readings from Last 1 Encounters:  08/17/20 '5\' 8"'  (1.727  m)    Weight:   Wt Readings from Last 1 Encounters:  10/01/20 85.8 kg    Ideal Body Weight:     BMI:  Body mass index is 28.76 kg/m.  Estimated Nutritional Needs:   Kcal:  2000-2200  Protein:  120-135 grams  Fluid:  >2 L/day  Lockie Pares., RD, LDN, CNSC See AMiON for contact information

## 2020-10-01 NOTE — Progress Notes (Signed)
Patient ID: Charles Weeks, male   DOB: 10-19-1965, 55 y.o.   MRN: [id number] Follow up - Trauma Critical Care  Patient Details:    Charles Weeks is an 55 y.o. male.  Lines/tubes : Gastrostomy/Enterostomy Percutaneous endoscopic gastrostomy (PEG) 20 Fr. LUQ (Active)  Surrounding Skin Dry;Intact 09/30/20 2000  Tube Status Patent 09/30/20 2000  Drainage Appearance None 09/30/20 2000  Dressing Status Clean;Dry;Intact 09/30/20 2000  Dressing Intervention Dressing changed 09/29/20 0800  Dressing Type Abdominal Binder;Split gauze 09/30/20 2000  Dressing Change Due 09/27/20 09/26/20 2000  G Port Intake (mL) 100 ml 09/23/20 1600  Output (mL) 0 mL 09/23/20 1000    Microbiology/Sepsis markers: Results for orders placed or performed during the hospital encounter of 08/17/20  Resp Panel by RT-PCR (Flu A&B, Covid) Nasopharyngeal Swab     Status: None   Collection Time: 08/17/20  3:40 PM   Specimen: Nasopharyngeal Swab; Nasopharyngeal(NP) swabs in vial transport medium  Result Value Ref Range Status   SARS Coronavirus 2 by RT PCR NEGATIVE NEGATIVE Final    Comment: (NOTE) SARS-CoV-2 target nucleic acids are NOT DETECTED.  The SARS-CoV-2 RNA is generally detectable in upper respiratory specimens during the acute phase of infection. The lowest concentration of SARS-CoV-2 viral copies this assay can detect is 138 copies/mL. A negative result does not preclude SARS-Cov-2 infection and should not be used as the sole basis for treatment or other patient management decisions. A negative result may occur with  improper specimen collection/handling, submission of specimen other than nasopharyngeal swab, presence of viral mutation(s) within the areas targeted by this assay, and inadequate number of viral copies(<138 copies/mL). A negative result must be combined with clinical observations, patient history, and epidemiological information. The expected result is Negative.  Fact Sheet for Patients:   BloggerCourse.com  Fact Sheet for Healthcare Providers:  SeriousBroker.it  This test is no t yet approved or cleared by the [place] FDA and  has been authorized for detection and/or diagnosis of SARS-CoV-2 by FDA under an Emergency Use Authorization (EUA). This EUA will remain  in effect (meaning this test can be used) for the duration of the COVID-19 declaration under Section 564(b)(1) of the Act, 21 U.S.C.section 360bbb-3(b)(1), unless the authorization is terminated  or revoked sooner.       Influenza A by PCR NEGATIVE NEGATIVE Final   Influenza B by PCR NEGATIVE NEGATIVE Final    Comment: (NOTE) The Xpert Xpress SARS-CoV-2/FLU/RSV plus assay is intended as an aid in the diagnosis of influenza from Nasopharyngeal swab specimens and should not be used as a sole basis for treatment. Nasal washings and aspirates are unacceptable for Xpert Xpress SARS-CoV-2/FLU/RSV testing.  Fact Sheet for Patients: BloggerCourse.com  Fact Sheet for Healthcare Providers: SeriousBroker.it  This test is not yet approved or cleared by the [place] FDA and has been authorized for detection and/or diagnosis of SARS-CoV-2 by FDA under an Emergency Use Authorization (EUA). This EUA will remain in effect (meaning this test can be used) for the duration of the COVID-19 declaration under Section 564(b)(1) of the Act, 21 U.S.C. section 360bbb-3(b)(1), unless the authorization is terminated or revoked.  Performed at [facility], [address], [place], [place] [address]   Surgical PCR screen     Status: None   Collection Time: 08/19/20  7:38 AM   Specimen: Nasal Mucosa; Nasal Swab  Result Value Ref Range Status   MRSA, PCR NEGATIVE NEGATIVE Final   Staphylococcus aureus NEGATIVE NEGATIVE Final    Comment: (  NOTE) The Xpert SA Assay (FDA approved for NASAL specimens in patients  85 years of age and older), is one component of a comprehensive surveillance program. It is not intended to diagnose infection nor to guide or monitor treatment. Performed at [facility], [address], [place], [place] [address]   MRSA PCR Screening     Status: None   Collection Time: 09/19/20  7:40 AM   Specimen: Nasal Mucosa; Nasopharyngeal  Result Value Ref Range Status   MRSA by PCR NEGATIVE NEGATIVE Final    Comment:        The GeneXpert MRSA Assay (FDA approved for NASAL specimens only), is one component of a comprehensive MRSA colonization surveillance program. It is not intended to diagnose MRSA infection nor to guide or monitor treatment for MRSA infections. Performed at [facility], [address], [place], [place] [address]     Anti-infectives:  Anti-infectives (From admission, onward)   Start     Dose/Rate Route Frequency Ordered Stop   08/19/20 0845  cefTRIAXone (ROCEPHIN) 2 g in sodium chloride 0.9 % 100 mL IVPB  Status:  Discontinued        2 g 200 mL/hr over 30 Minutes Intravenous Every 24 hours 08/19/20 0753 08/25/20 1304   08/17/20 1515  ceFAZolin (ANCEF) IVPB 2g/100 mL premix        2 g 200 mL/hr over 30 Minutes Intravenous  Once 08/17/20 1500 08/17/20 1530      Consults: Treatment Team:  Tia Alert, MD    Studies:    Events:  Subjective:    Overnight Issues:  On HTC Objective:  Vital signs for last 24 hours: Temp:  [98 F (36.7 C)-99.3 F (37.4 C)] 99.3 F (37.4 C) (05/16 0400) Pulse Rate:  [79-99] 94 (05/16 0804) Resp:  [9-14] 12 (05/16 0804) BP: (94-153)/(52-90) 153/76 (05/16 0804) SpO2:  [87 %-97 %] 92 % (05/16 0804) FiO2 (%):  [60 %-80 %] 80 % (05/16 0804) Weight:  [85.8 kg] 85.8 kg (05/16 0400)  Hemodynamic parameters for last 24 hours:    Intake/Output from previous day: 05/15 0701 - 05/16 0700 In: 1095 [NG/GT:1095] Out: 2300 [Urine:2300]  Intake/Output this shift: Total I/O In: 600  [NG/GT:600] Out: -   Vent settings for last 24 hours: FiO2 (%):  [60 %-80 %] 80 %  Physical Exam:  General: alert and no respiratory distress Neuro: alert and has some RUE weakness HEENT/Neck: trach with some secretions Resp: few rhonchi CVS: RRR GI: soft, NT Extremities: no edema  Results for orders placed or performed during the hospital encounter of 08/17/20 (from the past 24 hour(s))  Glucose, capillary     Status: Abnormal   Collection Time: 09/30/20 12:08 PM  Result Value Ref Range   Glucose-Capillary 118 (H) 70 - 99 mg/dL  Glucose, capillary     Status: Abnormal   Collection Time: 09/30/20  3:53 PM  Result Value Ref Range   Glucose-Capillary 167 (H) 70 - 99 mg/dL  Glucose, capillary     Status: Abnormal   Collection Time: 09/30/20  7:43 PM  Result Value Ref Range   Glucose-Capillary 152 (H) 70 - 99 mg/dL  Glucose, capillary     Status: Abnormal   Collection Time: 09/30/20 11:42 PM  Result Value Ref Range   Glucose-Capillary 116 (H) 70 - 99 mg/dL  Glucose, capillary     Status: Abnormal   Collection Time: 10/01/20  3:59 AM  Result Value Ref Range   Glucose-Capillary  125 (H) 70 - 99 mg/dL  Glucose, capillary     Status: Abnormal   Collection Time: 10/01/20  8:05 AM  Result Value Ref Range   Glucose-Capillary 123 (H) 70 - 99 mg/dL    Assessment & Plan: Present on Admission: . Thoracic spine fracture (HCC)    LOS: 45 days   Additional comments:I reviewed the patient's new clinical lab test results. . Fall from height  R PTX, B effusion Multiple B rib fxs- these are at the junction of the thoracic vertebral bodies, pain control 3 column T8 vertebral body fx-perDr. Jones,non-op now, plan mobilize in brace once off vent. Needs CTO brace for HOB>30 degrees. Will discuss timing of lifting restrictions T7 neural arch fx- per nsgy R TVP T4-8 - pain control, per nsgy R C7 TVP fx through foramen- C collar per Dr. Yetta Barre, CTAnegative Large scalp  laceration/right eyebrow laceration- repair by Dr. Lovick4/05/2020, sutures removed Right neck abrasion/skin tear- bacitracin BID Facial ecchymosis- CT maxillofacialdemonstrates no facial bone fractures. Some anterior subluxation of the right mandibular condyle HTN- home lopressor 25mg  daily CAD/H/O MI/DES- on ASA at home. Hold for now. Monitor, on tele Acute hypercarbic ventilator dependent respiratory failure/COPD-Trach/PEG 4/18by Dr. 03-05-1974. HTC 24h now - wean FiO2 as able (60%). O2 req'ts and secretions too significant to be able to safely transfer out of ICU. Changed to [facility] trach 5/15 to see if this helps decrease secretions Rash on back- benadryl Depression/anxiety- home bupropion and buspar BPH- home terazosin, repeatedly failedvoiding trials,  bethanachol. Actually give the home flomax as ordered now that he is taking PO. Cont I&O q6h ABL anemia- stable FEN-d/c TF to increase PO intake VTE-LMWH Dispo- ICU;HTC 24/7, wean FiO2 as able so we can consider CIR (once FiO2 35%), labs in AM Critical Care Total Time*: 32 Minutes  6/15, MD, MPH, FACS Trauma & General Surgery Use [email or web address] to contact on call provider  10/01/2020  *Care during the described time interval was provided by me. I have reviewed this patient's available data, including medical history, events of note, physical examination and test results as part of my evaluation.

## 2020-10-01 NOTE — Progress Notes (Signed)
Spoke with daughter on the phone at this time with updates on patient.

## 2020-10-02 LAB — BASIC METABOLIC PANEL
Anion gap: 6 (ref 5–15)
BUN: 21 mg/dL — ABNORMAL HIGH (ref 6–20)
CO2: 35 mmol/L — ABNORMAL HIGH (ref 22–32)
Calcium: 9.4 mg/dL (ref 8.9–10.3)
Chloride: 97 mmol/L — ABNORMAL LOW (ref 98–111)
Creatinine, Ser: 0.79 mg/dL (ref 0.61–1.24)
GFR, Estimated: >60 mL/min (ref >60)
Glucose, Bld: 155 mg/dL — ABNORMAL HIGH (ref 70–99)
Potassium: 4.2 mmol/L (ref 3.5–5.1)
Sodium: 138 mmol/L (ref 135–145)

## 2020-10-02 LAB — GLUCOSE, CAPILLARY
Glucose-Capillary: 116 mg/dL — ABNORMAL HIGH (ref 70–99)
Glucose-Capillary: 160 mg/dL — ABNORMAL HIGH (ref 70–99)
Glucose-Capillary: 160 mg/dL — ABNORMAL HIGH (ref 70–99)
Glucose-Capillary: 161 mg/dL — ABNORMAL HIGH (ref 70–99)
Glucose-Capillary: 165 mg/dL — ABNORMAL HIGH (ref 70–99)
Glucose-Capillary: 185 mg/dL — ABNORMAL HIGH (ref 70–99)

## 2020-10-02 LAB — CBC
HCT: 34.7 % — ABNORMAL LOW (ref 39.0–52.0)
Hemoglobin: 10.9 g/dL — ABNORMAL LOW (ref 13.0–17.0)
MCH: 29.2 pg (ref 26.0–34.0)
MCHC: 31.4 g/dL (ref 30.0–36.0)
MCV: 93 fL (ref 80.0–100.0)
Platelets: 316 10*3/uL (ref 150–400)
RBC: 3.73 MIL/uL — ABNORMAL LOW (ref 4.22–5.81)
RDW: 17.4 % — ABNORMAL HIGH (ref 11.5–15.5)
WBC: 18.8 10*3/uL — ABNORMAL HIGH (ref 4.0–10.5)
nRBC: 0 % (ref 0.0–0.2)

## 2020-10-02 NOTE — Progress Notes (Signed)
  Speech Language Pathology Treatment: Dysphagia;Cognitive-Linquistic  Patient Details Name: Charles Weeks MRN: [id number] DOB: 1965-06-15 Today's Date: 10/02/2020 Time: 1941-7408 SLP Time Calculation (min) (ACUTE ONLY): 21 min  Assessment / Plan / Recommendation Clinical Impression  Charles Weeks seen up in recliner and lunch tray on table and PMV donned. He has been tolerating Dys 2, thin liquids recommended after FEES 5/12. Able to masticate soft roast without delays or residue. He states he prefers to remain on ground texture versus attempting a higher consistency today. No s/s aspiration exhibited. Vocal intensity is greater with increased intelligibility with therapist needing clarification intermittently. All vitals within normal range although sats have trended downward past several days- 90-93% He required max assist for problem solving scenarios involving calculation of time (functional in relation to length of hospitalization). Verbalized safety awareness for needs (getting back to bed; bathroom). His sustained attention has improved significantly over past several weeks. Mikle would benefit from inpatient rehab services to increase cognitive and communicative independence.    HPI HPI: Pt is a 55 y.o. male who presented 4/1 after falling down about 11 stairs (sister reported fell over a half wall from second floor) and sustained R PTX, anterior subluxation of R mandibular condyle, multiple bil ribs fxs at junction of thoracic vertebral bodies T5-8 on R and T7-8 on L, 3 column T8 vertebral body fx, T7 neural arch fx, R TVP T4-8 fxs, and R C7 TVP fx through foramen. R chest tube 4/1. Pt found to be hypoxic and was intubated 4/2. Received trach and PEG placement 4/18. S/p L chest tube placement 4/19. PMH: HTN, COPD, CAD, s/p MI with DES RCA in Jan 2020 just on ASA, depression/anxiety, and BPH.      SLP Plan  Continue with current plan of care       Recommendations  Diet recommendations:  Dysphagia 2 (fine chop);Thin liquid Liquids provided via: Straw Medication Administration: Whole meds with puree Supervision: Patient able to self feed;Staff to assist with self feeding;Full supervision/cueing for compensatory strategies Compensations: Minimize environmental distractions;Slow rate;Small sips/bites;Lingual sweep for clearance of pocketing Postural Changes and/or Swallow Maneuvers: Seated upright 90 degrees      Patient may use Passy-Muir Speech Valve: During all waking hours (remove during sleep) PMSV Supervision: Intermittent         General recommendations: Rehab consult Oral Care Recommendations: Oral care BID Follow up Recommendations: Inpatient Rehab SLP Visit Diagnosis: Dysphagia, oropharyngeal phase (R13.12);Aphonia (R49.1);Cognitive communication deficit (X44.818) Plan: Continue with current plan of care                       [name] 10/02/2020, 2:36 PM  [name].Ed Nurse, children's [phone] Office [phone]

## 2020-10-02 NOTE — Progress Notes (Signed)
Patient's tube feed turned off at 0600 for day time  PO intake.

## 2020-10-02 NOTE — Progress Notes (Signed)
Occupational Therapy Treatment Patient Details Name: Charles Weeks MRN: [id number] DOB: 1965-06-03 Today's Date: 10/02/2020    History of present illness 55 y.o. male who presented 4/1 after falling down about 11 stairs and sustained R PTX, anterior subluxation of R mandibular condyle, multiple bil ribs fxs at junction of thoracic vertebral bodies T5-8 on R and T7-8 on L, 3 column T8 vertebral body fx, T7 neural arch fx, R TVP T4-8 fxs, and R C7 TVP fx through foramen. R chest tube inserted 4/1. Pt found to be hypoxic and was intubated 4/2. No evidence of vascular injury to neck, facial fx, acute intracranial injury, lumbar spine fx, R elbow acute abnormality, or pelvic fx identified on imaging. S/p trach and PEG placement 4/18. S/p L chest tube placement 4/19. PMH: HTN, COPD, CAD, s/p MI with DES RCA in Jan 2020 just on ASA, depression/anxiety, and BPH.   OT comments  Pt with goals updated for oob sitting and activity tolerance needs. Pt progressed to [facility] this session and then chair. Concerns noted are decreased O2 saturations with activity to 83% on 10L , skin integrity at posterior aspect of head with ccollar prolonged supine and skin on head due to decreased hygiene. Recommendation for a soft brush from [place] used for babies to help rub scalp and decreased what appears to be a cradle like skin cap. Pt verbalized feeling better up in the chair. Recommendation for CIR   Follow Up Recommendations  CIR    Equipment Recommendations  Wheelchair (measurements OT);Wheelchair cushion (measurements OT);Hospital bed    Recommendations for Other Services Rehab consult    Precautions / Restrictions Precautions Precautions: Cervical;Back Precaution Booklet Issued: No Precaution Comments: trach collar, condom cath, TLSO done with rolling if HOB >30 , ccollar at all times, abdominal binder to protect PEG Required Braces or Orthoses: Cervical Brace;Spinal Brace Cervical Brace: Hard collar;At all  times Spinal Brace: Thoracolumbosacral orthotic;Applied in supine position Restrictions Weight Bearing Restrictions: No       Mobility Bed Mobility Overal bed mobility: Needs Assistance Bed Mobility: Rolling;Sidelying to Sit Rolling: Min assist Sidelying to sit: Min assist       General bed mobility comments: MinA for rolling to left, exiting towards left side of bed with light assist for trunk elevation. Cues for scooting forward to edge of bed    Transfers Overall transfer level: Needs assistance Equipment used: Rolling walker (2 wheeled) Transfers: Sit to/from Stand Sit to Stand: Min assist         General transfer comment: MinA to rise from edge of bed and [name] and initially steady    Balance Overall balance assessment: Needs assistance Sitting-balance support: Single extremity supported Sitting balance-Leahy Scale: Poor Sitting balance - Comments: Reliant on single UE support-bilateral UE support. Close supervision for safety Postural control: Right lateral lean Standing balance support: Bilateral upper extremity supported Standing balance-Leahy Scale: Poor Standing balance comment: Pt requires BUE support for stability with intermittent min-modA to steady once he has lost balance. poor safety awareness results in frequently letting go of UE support requiring min-modA to steady                           ADL either performed or assessed with clinical judgement   ADL Overall ADL's : Needs assistance/impaired Eating/Feeding: NPO   Grooming: Wash/dry face;Minimal assistance;Sitting Grooming Details (indicate cue type and reason): terminates task quickly   Upper Body Bathing Details (indicate cue type and reason): RN  and OT working to address the posterior aspect of cervical collar for hygiene. pt noted to have area of pressure on head and skin appears to have a craddle like cap appearance       Upper Body Dressing Details (indicate cue type and reason):  total (A) to don brace     Toilet Transfer: Minimal assistance;+2 for physical assistance;+2 for safety/equipment;Stand-pivot;[name];RW Toilet Transfer Details (indicate cue type and reason): hand ove r hand to place hands Toileting- Clothing Manipulation and Hygiene: +2 for physical assistance;Minimal assistance Toileting - Clothing Manipulation Details (indicate cue type and reason): static standing with PT RW for total (A) for peri care after voiding bowels       General ADL Comments: pt with transfer to [facility] voiding but had reported no need to void. pt then insisting on sitting to void but had terminated the task     Vision       Perception     Praxis      Cognition Arousal/Alertness: Awake/alert Behavior During Therapy: Flat affect Overall Cognitive Status: Impaired/Different from baseline Area of Impairment: Following commands;Awareness               Rancho Levels of Cognitive Functioning Rancho Los Amigos Scales of Cognitive Functioning: Confused/appropriate   Current Attention Level: Sustained Memory: Decreased short-term memory;Decreased recall of precautions Following Commands: Follows multi-step commands inconsistently Safety/Judgement: Decreased awareness of safety;Decreased awareness of deficits Awareness: Emergent Problem Solving: Slow processing General Comments: PMV donned, able to verbalize basic needs when asked, follows all one step commands. At end of session, pt stating, "I'm not sure how much more exercise I can do. I'm tired."        Exercises Other Exercises Other Exercises: TLSO brace noted to move upward toward ccolar and needs repositioning twice. The extender is added back to TLSO brace to help with fit.   Shoulder Instructions       General Comments Trach 10 L/ 80% currently . Sats with transfer 83% HR 114 and sitting 1 minute rebounds to  91% 149/84 HR 109 RR 20    Pertinent Vitals/ Pain       Pain Assessment: Faces Faces Pain Scale:  Hurts little more Pain Location: "back" Pain Descriptors / Indicators: Discomfort;Grimacing;Guarding Pain Intervention(s): Monitored during session;Repositioned  Home Living                                          Prior Functioning/Environment              Frequency  Min 2X/week        Progress Toward Goals  OT Goals(current goals can now be found in the care plan section)  Progress towards OT goals: Progressing toward goals  Acute Rehab OT Goals Patient Stated Goal: to sit up and get out of the bed OT Goal Formulation: With patient Time For Goal Achievement: 10/16/20 Potential to Achieve Goals: Good ADL Goals Pt Will Perform Grooming: with min guard assist;sitting Additional ADL Goal #1: pt will follow 3 step command Additional ADL Goal #2: Pt will tolerate static standing for static for 1 minute ADLS  Plan Discharge plan remains appropriate    Co-evaluation    PT/OT/SLP Co-Evaluation/Treatment: Yes Reason for Co-Treatment: For patient/therapist safety;To address functional/ADL transfers PT goals addressed during session: Mobility/safety with mobility;Balance OT goals addressed during session: ADL's and self-care;Proper use of Adaptive equipment and DME;Strengthening/ROM  AM-PAC OT "6 Clicks" Daily Activity     Outcome Measure   Help from another person eating meals?: A Lot Help from another person taking care of personal grooming?: A Lot Help from another person toileting, which includes using toliet, bedpan, or urinal?: A Lot Help from another person bathing (including washing, rinsing, drying)?: A Lot Help from another person to put on and taking off regular upper body clothing?: A Lot Help from another person to put on and taking off regular lower body clothing?: Total 6 Click Score: 11    End of Session Equipment Utilized During Treatment: Oxygen  OT Visit Diagnosis: Unsteadiness on feet (R26.81);Cognitive communication deficit  (R41.841)   Activity Tolerance Patient tolerated treatment well   Patient Left in chair;with call bell/phone within reach;with chair alarm set   Nurse Communication Mobility status        Time: 3710-6269 OT Time Calculation (min): 29 min  Charges: OT General Charges $OT Visit: 1 Visit OT Treatments $Self Care/Home Management : 8-22 mins   Brynn, OTR/L  Acute Rehabilitation Services Pager: [phone] Office: [phone] .    Mateo Flow 10/02/2020, 9:56 AM

## 2020-10-02 NOTE — Progress Notes (Signed)
Patient requested another I&O at this time. 400 cc urine removed.

## 2020-10-02 NOTE — Progress Notes (Addendum)
Patient ID: Charles Weeks, male   DOB: 10-20-1965, 55 y.o.   MRN: [id number] Follow up - Trauma Critical Care  Patient Details:    Charles Weeks is an 55 y.o. male.  Lines/tubes : Gastrostomy/Enterostomy Percutaneous endoscopic gastrostomy (PEG) 20 Fr. LUQ (Active)  Surrounding Skin Dry;Intact 10/01/20 2000  Tube Status Patent 10/01/20 2000  Drainage Appearance None 09/30/20 2000  Dressing Status Clean;Dry;Intact 10/01/20 2000  Dressing Intervention Dressing changed 09/29/20 0800  Dressing Type Abdominal Binder;Split gauze 10/01/20 2000  Dressing Change Due 09/27/20 09/26/20 2000  G Port Intake (mL) 100 ml 09/23/20 1600  Output (mL) 0 mL 09/23/20 1000    Microbiology/Sepsis markers: Results for orders placed or performed during the hospital encounter of 08/17/20  Resp Panel by RT-PCR (Flu A&B, Covid) Nasopharyngeal Swab     Status: None   Collection Time: 08/17/20  3:40 PM   Specimen: Nasopharyngeal Swab; Nasopharyngeal(NP) swabs in vial transport medium  Result Value Ref Range Status   SARS Coronavirus 2 by RT PCR NEGATIVE NEGATIVE Final    Comment: (NOTE) SARS-CoV-2 target nucleic acids are NOT DETECTED.  The SARS-CoV-2 RNA is generally detectable in upper respiratory specimens during the acute phase of infection. The lowest concentration of SARS-CoV-2 viral copies this assay can detect is 138 copies/mL. A negative result does not preclude SARS-Cov-2 infection and should not be used as the sole basis for treatment or other patient management decisions. A negative result may occur with  improper specimen collection/handling, submission of specimen other than nasopharyngeal swab, presence of viral mutation(s) within the areas targeted by this assay, and inadequate number of viral copies(<138 copies/mL). A negative result must be combined with clinical observations, patient history, and epidemiological information. The expected result is Negative.  Fact Sheet for Patients:   BloggerCourse.com  Fact Sheet for Healthcare Providers:  SeriousBroker.it  This test is no t yet approved or cleared by the [place] FDA and  has been authorized for detection and/or diagnosis of SARS-CoV-2 by FDA under an Emergency Use Authorization (EUA). This EUA will remain  in effect (meaning this test can be used) for the duration of the COVID-19 declaration under Section 564(b)(1) of the Act, 21 U.S.C.section 360bbb-3(b)(1), unless the authorization is terminated  or revoked sooner.       Influenza A by PCR NEGATIVE NEGATIVE Final   Influenza B by PCR NEGATIVE NEGATIVE Final    Comment: (NOTE) The Xpert Xpress SARS-CoV-2/FLU/RSV plus assay is intended as an aid in the diagnosis of influenza from Nasopharyngeal swab specimens and should not be used as a sole basis for treatment. Nasal washings and aspirates are unacceptable for Xpert Xpress SARS-CoV-2/FLU/RSV testing.  Fact Sheet for Patients: BloggerCourse.com  Fact Sheet for Healthcare Providers: SeriousBroker.it  This test is not yet approved or cleared by the [place] FDA and has been authorized for detection and/or diagnosis of SARS-CoV-2 by FDA under an Emergency Use Authorization (EUA). This EUA will remain in effect (meaning this test can be used) for the duration of the COVID-19 declaration under Section 564(b)(1) of the Act, 21 U.S.C. section 360bbb-3(b)(1), unless the authorization is terminated or revoked.  Performed at [facility], [address], [place], [place] [address]   Surgical PCR screen     Status: None   Collection Time: 08/19/20  7:38 AM   Specimen: Nasal Mucosa; Nasal Swab  Result Value Ref Range Status   MRSA, PCR NEGATIVE NEGATIVE Final   Staphylococcus aureus NEGATIVE NEGATIVE Final    Comment: (  NOTE) The Xpert SA Assay (FDA approved for NASAL specimens in patients  1 years of age and older), is one component of a comprehensive surveillance program. It is not intended to diagnose infection nor to guide or monitor treatment. Performed at [facility], [address], [place], [place] [address]   MRSA PCR Screening     Status: None   Collection Time: 09/19/20  7:40 AM   Specimen: Nasal Mucosa; Nasopharyngeal  Result Value Ref Range Status   MRSA by PCR NEGATIVE NEGATIVE Final    Comment:        The GeneXpert MRSA Assay (FDA approved for NASAL specimens only), is one component of a comprehensive MRSA colonization surveillance program. It is not intended to diagnose MRSA infection nor to guide or monitor treatment for MRSA infections. Performed at [facility], [address], [place], [place] [address]     Anti-infectives:  Anti-infectives (From admission, onward)   Start     Dose/Rate Route Frequency Ordered Stop   08/19/20 0845  cefTRIAXone (ROCEPHIN) 2 g in sodium chloride 0.9 % 100 mL IVPB  Status:  Discontinued        2 g 200 mL/hr over 30 Minutes Intravenous Every 24 hours 08/19/20 0753 08/25/20 1304   08/17/20 1515  ceFAZolin (ANCEF) IVPB 2g/100 mL premix        2 g 200 mL/hr over 30 Minutes Intravenous  Once 08/17/20 1500 08/17/20 1530     Consults: Treatment Team:  Tia Alert, MD    Studies:    Events:  Subjective:    Overnight Issues: NAE  Objective:  Vital signs for last 24 hours: Temp:  [98.7 F (37.1 C)-99.5 F (37.5 C)] 99.3 F (37.4 C) (05/17 0400) Pulse Rate:  [79-106] 100 (05/17 0733) Resp:  [10-20] 17 (05/17 0733) BP: (111-159)/(73-135) 137/89 (05/17 0733) SpO2:  [82 %-99 %] 92 % (05/17 0733) FiO2 (%):  [80 %] 80 % (05/17 0733) Weight:  [85.8 kg] 85.8 kg (05/17 0437)  Hemodynamic parameters for last 24 hours:    Intake/Output from previous day: 05/16 0701 - 05/17 0700 In: 3308 [P.O.:440; NG/GT:2868] Out: 3075 [Urine:3075]  Intake/Output this shift: No intake/output data  recorded.  Vent settings for last 24 hours: FiO2 (%):  [80 %] 80 %  Physical Exam:  General: alert and no respiratory distress Neuro: alert and oriented HEENT/Neck: trach-clean, intact Resp: clear to auscultation bilaterally CVS: RRR GI: soft, NT Extremities: no edema, no erythema, pulses WNL  Results for orders placed or performed during the hospital encounter of 08/17/20 (from the past 24 hour(s))  Glucose, capillary     Status: Abnormal   Collection Time: 10/01/20  8:05 AM  Result Value Ref Range   Glucose-Capillary 123 (H) 70 - 99 mg/dL  Glucose, capillary     Status: Abnormal   Collection Time: 10/01/20 11:31 AM  Result Value Ref Range   Glucose-Capillary 140 (H) 70 - 99 mg/dL  Glucose, capillary     Status: Abnormal   Collection Time: 10/01/20  4:05 PM  Result Value Ref Range   Glucose-Capillary 100 (H) 70 - 99 mg/dL  Glucose, capillary     Status: Abnormal   Collection Time: 10/01/20  7:49 PM  Result Value Ref Range   Glucose-Capillary 144 (H) 70 - 99 mg/dL  Glucose, capillary     Status: Abnormal   Collection Time: 10/01/20 11:34 PM  Result Value Ref Range   Glucose-Capillary 154 (H) 70 - 99 mg/dL  CBC     Status: Abnormal   Collection Time: 10/02/20  2:00 AM  Result Value Ref Range   WBC 18.8 (H) 4.0 - 10.5 K/uL   RBC 3.73 (L) 4.22 - 5.81 MIL/uL   Hemoglobin 10.9 (L) 13.0 - 17.0 g/dL   HCT 93.7 (L) 90.2 - 40.9 %   MCV 93.0 80.0 - 100.0 fL   MCH 29.2 26.0 - 34.0 pg   MCHC 31.4 30.0 - 36.0 g/dL   RDW 73.5 (H) 32.9 - 92.4 %   Platelets 316 150 - 400 K/uL   nRBC 0.0 0.0 - 0.2 %  Basic metabolic panel     Status: Abnormal   Collection Time: 10/02/20  2:00 AM  Result Value Ref Range   Sodium 138 135 - 145 mmol/L   Potassium 4.2 3.5 - 5.1 mmol/L   Chloride 97 (L) 98 - 111 mmol/L   CO2 35 (H) 22 - 32 mmol/L   Glucose, Bld 155 (H) 70 - 99 mg/dL   BUN 21 (H) 6 - 20 mg/dL   Creatinine, Ser 2.68 0.61 - 1.24 mg/dL   Calcium 9.4 8.9 - 34.1 mg/dL   GFR, Estimated  >96 >22 mL/min   Anion gap 6 5 - 15  Glucose, capillary     Status: Abnormal   Collection Time: 10/02/20  4:03 AM  Result Value Ref Range   Glucose-Capillary 165 (H) 70 - 99 mg/dL    Assessment & Plan: Present on Admission: . Thoracic spine fracture (HCC)    LOS: 46 days   Additional comments:I reviewed the patient's new clinical lab test results. . Fall from height  R PTX, B effusion Multiple B rib fxs- these are at the junction of the thoracic vertebral bodies, pain control 3 column T8 vertebral body fx-perDr. Jones,non-op now, plan mobilize in brace once off vent. Needs CTO brace for HOB>30 degrees. Will discuss timing of lifting restrictions T7 neural arch fx- per nsgy R TVP T4-8 - pain control, per nsgy R C7 TVP fx through foramen- C collar per Dr. Yetta Barre, CTAnegative Large scalp laceration/right eyebrow laceration- repair by Dr. Lovick4/05/2020, sutures removed Right neck abrasion/skin tear- bacitracin BID Facial ecchymosis- CT maxillofacialdemonstrates no facial bone fractures. Some anterior subluxation of the right mandibular condyle HTN- home lopressor 25mg  daily CAD/H/O MI/DES- on home ASA Acute hypercarbic ventilator dependent respiratory failure/COPD-Trach/PEG 4/18by Dr. 03-05-1974. HTC 24h now - wean FiO2 as able (80%). O2 req'ts and secretions too significant to be able to safely transfer out of ICU. Changed to [facility] trach 5/15 to see if this helps decrease secretions. CXR in AM Rash on back- benadryl Depression/anxiety- home bupropion and buspar BPH- home terazosin, repeatedly failedvoiding trials,  bethanachol. Home flomax as ordered now that he is taking PO. Cont I&O q6h ABL anemia- stable FEN-TF overnight, eating during the day, ate more last night VTE-LMWH Dispo- ICU;HTC 24/7, wean FiO2 as able so we can consider CIR (once FiO2 35%),   Critical Care Total Time*: 33 Minutes  6/15, MD, MPH, FACS Trauma & General  Surgery Use [email or web address] to contact on call provider  10/02/2020  *Care during the described time interval was provided by me. I have reviewed this patient's available data, including medical history, events of note, physical examination and test results as part of my evaluation.

## 2020-10-02 NOTE — Progress Notes (Signed)
Physical Therapy Treatment Patient Details Name: Charles Weeks MRN: [id number] DOB: 07-Nov-1965 Today's Date: 10/02/2020    History of Present Illness 55 y.o. male who presented 4/1 after falling down about 11 stairs and sustained R PTX, anterior subluxation of R mandibular condyle, multiple bil ribs fxs at junction of thoracic vertebral bodies T5-8 on R and T7-8 on L, 3 column T8 vertebral body fx, T7 neural arch fx, R TVP T4-8 fxs, and R C7 TVP fx through foramen. R chest tube inserted 4/1. Pt found to be hypoxic and was intubated 4/2. No evidence of vascular injury to neck, facial fx, acute intracranial injury, lumbar spine fx, R elbow acute abnormality, or pelvic fx identified on imaging. S/p trach and PEG placement 4/18. S/p L chest tube placement 4/19. PMH: HTN, COPD, CAD, s/p MI with DES RCA in Jan 2020 just on ASA, depression/anxiety, and BPH.    PT Comments    Pt making steady progress towards his physical therapy goals; exhibiting improved cognition and activity tolerance. Adjusted TLSO and cervical collar for optimal fit. Ambulating from bed to [name] and then [name] to recliner with a walker and min assist (+2 safety/equipment). Desaturation to 83% on 10L O2 via 80% TC, but rebounded to 90-92% with seated rest break, BP 149/84, HR 114 bpm. Pt reporting mild nausea, had frequent coughing and secretions throughout. Continue to recommend comprehensive inpatient rehab (CIR) for post-acute therapy needs.   Follow Up Recommendations  CIR     Equipment Recommendations  Wheelchair (measurements PT);Wheelchair cushion (measurements PT);Rolling walker with 5" wheels;3in1 (PT)    Recommendations for Other Services       Precautions / Restrictions Precautions Precautions: Cervical;Back Precaution Booklet Issued: No Precaution Comments: trach collar, condom cath, TLSO done with rolling if HOB >30 , ccollar at all times, abdominal binder to protect PEG Required Braces or Orthoses: Cervical  Brace;Spinal Brace Cervical Brace: Hard collar;At all times Spinal Brace: Thoracolumbosacral orthotic;Applied in supine position Restrictions Weight Bearing Restrictions: No    Mobility  Bed Mobility Overal bed mobility: Needs Assistance Bed Mobility: Rolling;Sidelying to Sit Rolling: Min assist Sidelying to sit: Min assist       General bed mobility comments: MinA for rolling to left, exiting towards left side of bed with light assist for trunk elevation. Cues for scooting forward to edge of bed    Transfers Overall transfer level: Needs assistance Equipment used: Rolling walker (2 wheeled) Transfers: Sit to/from Stand Sit to Stand: Min assist         General transfer comment: MinA to rise from edge of bed and [name] and initially steady  Ambulation/Gait Ambulation/Gait assistance: Min assist;+2 safety/equipment Gait Distance (Feet): 6 Feet (3 ft, 3 ft) Assistive device: Rolling walker (2 wheeled) Gait Pattern/deviations: Decreased stride length;Trunk flexed;Narrow base of support;Step-through pattern Gait velocity: decreased   General Gait Details: Pt ambulating from bed to [name], then from [name] to recliner with minA (+2 safety), cues provided for sequencing/direction, dynamic instability noted   Stairs             Wheelchair Mobility    Modified Rankin (Stroke Patients Only)       Balance Overall balance assessment: Needs assistance Sitting-balance support: Single extremity supported Sitting balance-Leahy Scale: Poor Sitting balance - Comments: Reliant on single UE support-bilateral UE support. Close supervision for safety Postural control: Right lateral lean Standing balance support: Bilateral upper extremity supported Standing balance-Leahy Scale: Poor Standing balance comment: Pt requires BUE support for stability with intermittent min-modA to steady  once he has lost balance. poor safety awareness results in frequently letting go of UE support requiring  min-modA to steady                            Cognition Arousal/Alertness: Awake/alert Behavior During Therapy: Flat affect Overall Cognitive Status: Impaired/Different from baseline Area of Impairment: Following commands;Awareness               Rancho Levels of Cognitive Functioning Rancho Los Amigos Scales of Cognitive Functioning: Confused/appropriate   Current Attention Level: Sustained Memory: Decreased short-term memory;Decreased recall of precautions Following Commands: Follows multi-step commands inconsistently Safety/Judgement: Decreased awareness of safety;Decreased awareness of deficits Awareness: Emergent Problem Solving: Slow processing General Comments: PMV donned, able to verbalize basic needs when asked, follows all one step commands. At end of session, pt stating, "I'm not sure how much more exercise I can do. I'm tired."      Exercises      General Comments        Pertinent Vitals/Pain Pain Assessment: Faces Faces Pain Scale: Hurts little more Pain Location: "back" Pain Descriptors / Indicators: Discomfort;Grimacing;Guarding Pain Intervention(s): Monitored during session;Limited activity within patient's tolerance    Home Living                      Prior Function            PT Goals (current goals can now be found in the care plan section) Acute Rehab PT Goals Patient Stated Goal: did not state PT Goal Formulation: With patient Time For Goal Achievement: 10/16/20 Potential to Achieve Goals: Fair Progress towards PT goals: Progressing toward goals    Frequency    Min 4X/week      PT Plan Current plan remains appropriate    Co-evaluation PT/OT/SLP Co-Evaluation/Treatment: Yes Reason for Co-Treatment: For patient/therapist safety;To address functional/ADL transfers PT goals addressed during session: Mobility/safety with mobility;Balance        AM-PAC PT "6 Clicks" Mobility   Outcome Measure  Help needed  turning from your back to your side while in a flat bed without using bedrails?: A Little Help needed moving from lying on your back to sitting on the side of a flat bed without using bedrails?: A Little Help needed moving to and from a bed to a chair (including a wheelchair)?: A Little Help needed standing up from a chair using your arms (e.g., wheelchair or bedside chair)?: A Little Help needed to walk in hospital room?: A Lot Help needed climbing 3-5 steps with a railing? : Total 6 Click Score: 15    End of Session Equipment Utilized During Treatment: Gait belt;Back brace;Cervical collar;Oxygen Activity Tolerance: Patient tolerated treatment well Patient left: in chair;with call bell/phone within reach;with chair alarm set;with restraints reapplied Nurse Communication: Mobility status PT Visit Diagnosis: Muscle weakness (generalized) (M62.81);Difficulty in walking, not elsewhere classified (R26.2);Unsteadiness on feet (R26.81);Other abnormalities of gait and mobility (R26.89)     Time: 2595-6387 PT Time Calculation (min) (ACUTE ONLY): 30 min  Charges:  $Therapeutic Activity: 8-22 mins                     Lillia Pauls, PT, DPT Acute Rehabilitation Services Pager [phone] Office [phone]    Charles Weeks 10/02/2020, 9:39 AM

## 2020-10-03 ENCOUNTER — Inpatient Hospital Stay (HOSPITAL_COMMUNITY): Payer: Medicaid Other

## 2020-10-03 LAB — GLUCOSE, CAPILLARY
Glucose-Capillary: 132 mg/dL — ABNORMAL HIGH (ref 70–99)
Glucose-Capillary: 134 mg/dL — ABNORMAL HIGH (ref 70–99)
Glucose-Capillary: 165 mg/dL — ABNORMAL HIGH (ref 70–99)
Glucose-Capillary: 167 mg/dL — ABNORMAL HIGH (ref 70–99)
Glucose-Capillary: 170 mg/dL — ABNORMAL HIGH (ref 70–99)
Glucose-Capillary: 178 mg/dL — ABNORMAL HIGH (ref 70–99)

## 2020-10-03 NOTE — Progress Notes (Signed)
Patient ID: Charles Weeks, male   DOB: [birth year], 55 y.o.   MRN: [id number] Follow up - Trauma Critical Care  Patient Details:    Charles Weeks is an 55 y.o. male.  Lines/tubes : Gastrostomy/Enterostomy Percutaneous endoscopic gastrostomy (PEG) 20 Fr. LUQ (Active)  Surrounding Skin Dry;Intact 10/03/20 0744  Tube Status Patent 10/03/20 0744  Drainage Appearance None 10/03/20 0744  Dressing Status Clean;Dry;Intact 10/03/20 0744  Dressing Intervention Other (Comment) 10/03/20 0744  Dressing Type Abdominal Binder;Split gauze 10/03/20 0744  Dressing Change Due 10/04/20 10/03/20 0744  G Port Intake (mL) 100 ml 09/23/20 1600  Output (mL) 0 mL 09/23/20 1000     External Urinary Catheter (Active)  Collection Container Standard drainage bag 10/03/20 0744  Suction (Verified suction is between 40-80 mmHg) N/A (Patient has condom catheter) 10/03/20 0744  Securement Method Securing device (Describe) 10/03/20 0744  Site Assessment Clean;Intact 10/03/20 0744  Intervention Equipment Changed 10/03/20 0536    Microbiology/Sepsis markers: Results for orders placed or performed during the hospital encounter of 08/17/20  Resp Panel by RT-PCR (Flu A&B, Covid) Nasopharyngeal Swab     Status: None   Collection Time: 08/17/20  3:40 PM   Specimen: Nasopharyngeal Swab; Nasopharyngeal(NP) swabs in vial transport medium  Result Value Ref Range Status   SARS Coronavirus 2 by RT PCR NEGATIVE NEGATIVE Final    Comment: (NOTE) SARS-CoV-2 target nucleic acids are NOT DETECTED.  The SARS-CoV-2 RNA is generally detectable in upper respiratory specimens during the acute phase of infection. The lowest concentration of SARS-CoV-2 viral copies this assay can detect is 138 copies/mL. A negative result does not preclude SARS-Cov-2 infection and should not be used as the sole basis for treatment or other patient management decisions. A negative result may occur with  improper specimen collection/handling,  submission of specimen other than nasopharyngeal swab, presence of viral mutation(s) within the areas targeted by this assay, and inadequate number of viral copies(<138 copies/mL). A negative result must be combined with clinical observations, patient history, and epidemiological information. The expected result is Negative.  Fact Sheet for Patients:  BloggerCourse.com  Fact Sheet for Healthcare Providers:  SeriousBroker.it  This test is no t yet approved or cleared by the [place] FDA and  has been authorized for detection and/or diagnosis of SARS-CoV-2 by FDA under an Emergency Use Authorization (EUA). This EUA will remain  in effect (meaning this test can be used) for the duration of the COVID-19 declaration under Section 564(b)(1) of the Act, 21 U.S.C.section 360bbb-3(b)(1), unless the authorization is terminated  or revoked sooner.       Influenza A by PCR NEGATIVE NEGATIVE Final   Influenza B by PCR NEGATIVE NEGATIVE Final    Comment: (NOTE) The Xpert Xpress SARS-CoV-2/FLU/RSV plus assay is intended as an aid in the diagnosis of influenza from Nasopharyngeal swab specimens and should not be used as a sole basis for treatment. Nasal washings and aspirates are unacceptable for Xpert Xpress SARS-CoV-2/FLU/RSV testing.  Fact Sheet for Patients: BloggerCourse.com  Fact Sheet for Healthcare Providers: SeriousBroker.it  This test is not yet approved or cleared by the [place] FDA and has been authorized for detection and/or diagnosis of SARS-CoV-2 by FDA under an Emergency Use Authorization (EUA). This EUA will remain in effect (meaning this test can be used) for the duration of the COVID-19 declaration under Section 564(b)(1) of the Act, 21 U.S.C. section 360bbb-3(b)(1), unless the authorization is terminated or revoked.  Performed at [facility], [address] [place],  [place] [address]   Surgical PCR screen     Status: None   Collection Time: 08/19/20  7:38 AM   Specimen: Nasal Mucosa; Nasal Swab  Result Value Ref Range Status   MRSA, PCR NEGATIVE NEGATIVE Final   Staphylococcus aureus NEGATIVE NEGATIVE Final    Comment: (NOTE) The Xpert SA Assay (FDA approved for NASAL specimens in patients 51 years of age and older), is one component of a comprehensive surveillance program. It is not intended to diagnose infection nor to guide or monitor treatment. Performed at [facility], [address] [place], [place] [address]   MRSA PCR Screening     Status: None   Collection Time: 09/19/20  7:40 AM   Specimen: Nasal Mucosa; Nasopharyngeal  Result Value Ref Range Status   MRSA by PCR NEGATIVE NEGATIVE Final    Comment:        The GeneXpert MRSA Assay (FDA approved for NASAL specimens only), is one component of a comprehensive MRSA colonization surveillance program. It is not intended to diagnose MRSA infection nor to guide or monitor treatment for MRSA infections. Performed at [facility], [address], [place], [place] [address]     Anti-infectives:  Anti-infectives (From admission, onward)   Start     Dose/Rate Route Frequency Ordered Stop   08/19/20 0845  cefTRIAXone (ROCEPHIN) 2 g in sodium chloride 0.9 % 100 mL IVPB  Status:  Discontinued        2 g 200 mL/hr over 30 Minutes Intravenous Every 24 hours 08/19/20 0753 08/25/20 1304   08/17/20 1515  ceFAZolin (ANCEF) IVPB 2g/100 mL premix        2 g 200 mL/hr over 30 Minutes Intravenous  Once 08/17/20 1500 08/17/20 1530      Best Practice/Protocols:  VTE Prophylaxis: Lovenox (prophylaxtic dose) .  Consults: Treatment Team:  Tia Alert, MD    Studies:    Events:  Subjective:    Overnight Issues:   Objective:  Vital signs for last 24 hours: Temp:  [98.3 F (36.8 C)-99.3 F (37.4 C)] 98.6 F (37 C) (05/18 0400) Pulse Rate:  [80-105]  90 (05/18 0800) Resp:  [11-19] 15 (05/18 0800) BP: (103-150)/(66-93) 126/74 (05/18 0800) SpO2:  [85 %-100 %] 100 % (05/18 0800) FiO2 (%):  [60 %-80 %] 60 % (05/18 0750) Weight:  [85.8 kg] 85.8 kg (05/18 0500)  Hemodynamic parameters for last 24 hours:    Intake/Output from previous day: 05/17 0701 - 05/18 0700 In: 2645.3 [P.O.:440; NG/GT:2205.3] Out: 2150 [Urine:2150]  Intake/Output this shift: No intake/output data recorded.  Vent settings for last 24 hours: FiO2 (%):  [60 %-80 %] 60 %  Physical Exam:  General: alert and no respiratory distress Neuro: alert and F/C HEENT/Neck: trach-clean, intact Resp: clear to auscultation bilaterally CVS: RRR GI: soft, NT, PEG Extremities: calves soft  Results for orders placed or performed during the hospital encounter of 08/17/20 (from the past 24 hour(s))  Glucose, capillary     Status: Abnormal   Collection Time: 10/02/20 11:42 AM  Result Value Ref Range   Glucose-Capillary 160 (H) 70 - 99 mg/dL   Comment 1 Notify RN    Comment 2 Document in Chart   Glucose, capillary     Status: Abnormal   Collection Time: 10/02/20  3:32 PM  Result Value Ref Range   Glucose-Capillary 116 (H) 70 - 99 mg/dL   Comment 1 Notify RN    Comment 2 Document in Chart  Glucose, capillary     Status: Abnormal   Collection Time: 10/02/20  7:51 PM  Result Value Ref Range   Glucose-Capillary 185 (H) 70 - 99 mg/dL  Glucose, capillary     Status: Abnormal   Collection Time: 10/02/20 11:50 PM  Result Value Ref Range   Glucose-Capillary 160 (H) 70 - 99 mg/dL  Glucose, capillary     Status: Abnormal   Collection Time: 10/03/20  3:48 AM  Result Value Ref Range   Glucose-Capillary 178 (H) 70 - 99 mg/dL  Glucose, capillary     Status: Abnormal   Collection Time: 10/03/20  8:14 AM  Result Value Ref Range   Glucose-Capillary 134 (H) 70 - 99 mg/dL    Assessment & Plan: Present on Admission: . Thoracic spine fracture (HCC)    LOS: 47 days   Additional  comments:I reviewed the patient's new clinical lab test results. and CXR Fall from height  R PTX, B effusion Multiple B rib fxs- these are at the junction of the thoracic vertebral bodies, pain control 3 column T8 vertebral body fx-perDr. Jones,non-op now, plan mobilize in brace once off vent. Needs CTO brace for HOB>30 degrees. Will discuss timing of lifting restrictions T7 neural arch fx- per nsgy R TVP T4-8 - pain control, per nsgy R C7 TVP fx through foramen- C collar per Dr. Yetta Barre, CTAnegative Large scalp laceration/right eyebrow laceration- repair by Dr. Lovick4/05/2020, sutures removed Right neck abrasion/skin tear- bacitracin BID Facial ecchymosis- CT maxillofacialdemonstrates no facial bone fractures. Some anterior subluxation of the right mandibular condyle HTN- home lopressor 25mg  daily CAD/H/O MI/DES- on home ASA Acute hypercarbic ventilator dependent respiratory failure/COPD-Trach/PEG 4/18by Dr. 03-05-1974. HTC 24h, tolerated 60% overnight and sats 100% so will try 40% now Rash on back- benadryl Depression/anxiety- home bupropion and buspar BPH- home terazosin, repeatedly failedvoiding trials,  bethanachol. Home flomax as ordered now that he is taking PO. Cont I&O q6h ABL anemia- stable FEN-TF overnight, eating during the day, ate more last night VTE-LMWH Dispo- ICU;HTC 24/7, wean FiO2 as able so we can consider CIR (once FiO2 35%),   Critical Care Total Time*: 33 Minutes  Janee Morn, MD, MPH, FACS Trauma & General Surgery Use [email or web address] to contact on call provider  10/03/2020  *Care during the described time interval was provided by me. I have reviewed this patient's available data, including medical history, events of note, physical examination and test results as part of my evaluation.

## 2020-10-03 NOTE — Progress Notes (Signed)
Patient had one large urine occurrence while he was asleep! :) I will hold off on the I&O for 0600 and patient now has a condom catheter on.

## 2020-10-03 NOTE — Progress Notes (Signed)
Physical Therapy Treatment Patient Details Name: Charles Weeks MRN: [id number] DOB: 05/28/1965 Today's Date: 10/03/2020    History of Present Illness 55 y.o. male who presented 4/1 after falling down about 11 stairs and sustained R PTX, anterior subluxation of R mandibular condyle, multiple bil ribs fxs at junction of thoracic vertebral bodies T5-8 on R and T7-8 on L, 3 column T8 vertebral body fx, T7 neural arch fx, R TVP T4-8 fxs, and R C7 TVP fx through foramen. R chest tube inserted 4/1. Pt found to be hypoxic and was intubated 4/2. No evidence of vascular injury to neck, facial fx, acute intracranial injury, lumbar spine fx, R elbow acute abnormality, or pelvic fx identified on imaging. S/p trach and PEG placement 4/18. S/p L chest tube placement 4/19. PMH: HTN, COPD, CAD, s/p MI with DES RCA in Jan 2020 just on ASA, depression/anxiety, and BPH.    PT Comments    Pt continuing to demonstrate progress towards his goals, maintaining SpO2 levels >/= 89% with a short gait bout this date. However, his SpO2 does decline with coughing bouts. Pt demonstrates improved multi-step command following capabilities and improved social interaction, joking throughout session. However, he still displays deficits in memory of precautions and with sequencing and problem-solving, needing assistance and cues for taking steps and managing his RW. Pt needing minAx1 this date for transfers and to ambulate ~4 ft with a RW. Will continue to follow acutely. Current recommendations remain appropriate.    Follow Up Recommendations  CIR     Equipment Recommendations  Wheelchair (measurements PT);Wheelchair cushion (measurements PT);Rolling walker with 5" wheels;3in1 (PT)    Recommendations for Other Services       Precautions / Restrictions Precautions Precautions: Cervical;Back Precaution Booklet Issued: No Precaution Comments: trach collar, condom cath, TLSO done with rolling if HOB >30 , ccollar at all times,  abdominal binder to protect PEG Required Braces or Orthoses: Cervical Brace;Spinal Brace Cervical Brace: Hard collar;At all times Spinal Brace: Thoracolumbosacral orthotic;Applied in supine position Restrictions Weight Bearing Restrictions: No    Mobility  Bed Mobility Overal bed mobility: Needs Assistance Bed Mobility: Rolling;Sidelying to Sit Rolling: Min guard Sidelying to sit: Min assist       General bed mobility comments: min guard for rolling to left, cuing to flex knees and reach across body for rail, exiting towards left side of bed with light assist for trunk elevation. Cues for scooting forward to edge of bed    Transfers Overall transfer level: Needs assistance Equipment used: Rolling walker (2 wheeled) Transfers: Sit to/from Stand Sit to Stand: Min assist         General transfer comment: MinA to rise from edge of bed and initially steady  Ambulation/Gait Ambulation/Gait assistance: Min assist Gait Distance (Feet): 4 Feet Assistive device: Rolling walker (2 wheeled) Gait Pattern/deviations: Decreased stride length;Trunk flexed;Narrow base of support;Step-through pattern Gait velocity: decreased Gait velocity interpretation: <1.31 ft/sec, indicative of household ambulator General Gait Details: Pt ambulating from bed to L to recliner, needing cues to sequence steps and RW management while maintaining precautions. MinA for steadying.   Stairs             Wheelchair Mobility    Modified Rankin (Stroke Patients Only) Modified Rankin (Stroke Patients Only) Pre-Morbid Rankin Score: No symptoms Modified Rankin: Moderately severe disability     Balance Overall balance assessment: Needs assistance Sitting-balance support: Single extremity supported Sitting balance-Leahy Scale: Poor Sitting balance - Comments: Reliant on single UE support-bilateral UE support. Close supervision  for safety   Standing balance support: Bilateral upper extremity  supported Standing balance-Leahy Scale: Poor Standing balance comment: Pt requires Bil UE support for stability with minA for steadying.                            Cognition Arousal/Alertness: Awake/alert Behavior During Therapy: WFL for tasks assessed/performed Overall Cognitive Status: Impaired/Different from baseline Area of Impairment: Following commands;Awareness;Problem solving;Memory               Rancho Levels of Cognitive Functioning Rancho Los Amigos Scales of Cognitive Functioning: Confused/appropriate (emerging VII)     Memory: Decreased recall of precautions Following Commands: Follows multi-step commands inconsistently;Follows multi-step commands with increased time   Awareness: Emergent Problem Solving: Slow processing;Difficulty sequencing;Requires verbal cues General Comments: PMV donned, able to verbalize basic needs and makes jokes throughout session. Pt follows multi-step commands with increased time and repetition. Pt with slow processing and needing cues to sequence tasks.      Exercises      General Comments General comments (skin integrity, edema, etc.): Trach 10 L currently . Sats >/= 89% with functional mobility but as low as 86% with seated coughing fit, needing deep suction by RN and extended time to recover to >/= 90%      Pertinent Vitals/Pain Pain Assessment: 0-10 Pain Score: 8  Pain Location: back, neck, headache, R shoulder Pain Descriptors / Indicators: Discomfort;Grimacing;Guarding Pain Intervention(s): Limited activity within patient's tolerance;Monitored during session;Repositioned    Home Living                      Prior Function            PT Goals (current goals can now be found in the care plan section) Acute Rehab PT Goals Patient Stated Goal: to get OOB PT Goal Formulation: With patient Time For Goal Achievement: 10/16/20 Potential to Achieve Goals: Good Progress towards PT goals: Progressing toward  goals    Frequency    Min 4X/week      PT Plan Current plan remains appropriate    Co-evaluation              AM-PAC PT "6 Clicks" Mobility   Outcome Measure  Help needed turning from your back to your side while in a flat bed without using bedrails?: A Little Help needed moving from lying on your back to sitting on the side of a flat bed without using bedrails?: A Little Help needed moving to and from a bed to a chair (including a wheelchair)?: A Little Help needed standing up from a chair using your arms (e.g., wheelchair or bedside chair)?: A Little Help needed to walk in hospital room?: A Lot Help needed climbing 3-5 steps with a railing? : Total 6 Click Score: 15    End of Session Equipment Utilized During Treatment: Gait belt;Back brace;Cervical collar;Oxygen Activity Tolerance: Patient tolerated treatment well Patient left: in chair;with call bell/phone within reach;with chair alarm set Nurse Communication: Mobility status;Other (comment) (sats) PT Visit Diagnosis: Muscle weakness (generalized) (M62.81);Difficulty in walking, not elsewhere classified (R26.2);Unsteadiness on feet (R26.81);Other abnormalities of gait and mobility (R26.89)     Time: 7017-7939 PT Time Calculation (min) (ACUTE ONLY): 39 min  Charges:  $Gait Training: 8-22 mins $Therapeutic Activity: 23-37 mins                     Raymond Gurney, PT, DPT Acute Rehabilitation Services  Pager: [phone] Office: [phone]    Charles Weeks 10/03/2020, 6:21 PM

## 2020-10-04 ENCOUNTER — Inpatient Hospital Stay (HOSPITAL_COMMUNITY): Payer: Medicaid Other

## 2020-10-04 LAB — GLUCOSE, CAPILLARY
Glucose-Capillary: 133 mg/dL — ABNORMAL HIGH (ref 70–99)
Glucose-Capillary: 137 mg/dL — ABNORMAL HIGH (ref 70–99)
Glucose-Capillary: 138 mg/dL — ABNORMAL HIGH (ref 70–99)
Glucose-Capillary: 145 mg/dL — ABNORMAL HIGH (ref 70–99)
Glucose-Capillary: 146 mg/dL — ABNORMAL HIGH (ref 70–99)
Glucose-Capillary: 220 mg/dL — ABNORMAL HIGH (ref 70–99)

## 2020-10-04 LAB — CBC
HCT: 32.4 % — ABNORMAL LOW (ref 39.0–52.0)
Hemoglobin: 10.1 g/dL — ABNORMAL LOW (ref 13.0–17.0)
MCH: 28.9 pg (ref 26.0–34.0)
MCHC: 31.2 g/dL (ref 30.0–36.0)
MCV: 92.8 fL (ref 80.0–100.0)
Platelets: 279 10*3/uL (ref 150–400)
RBC: 3.49 MIL/uL — ABNORMAL LOW (ref 4.22–5.81)
RDW: 17.2 % — ABNORMAL HIGH (ref 11.5–15.5)
WBC: 13.5 10*3/uL — ABNORMAL HIGH (ref 4.0–10.5)
nRBC: 0 % (ref 0.0–0.2)

## 2020-10-04 MED ORDER — PIVOT 1.5 CAL PO LIQD: 780.0000 mL | ORAL | Status: DC

## 2020-10-04 MED ORDER — PROSOURCE TF PO LIQD
45.0000 mL | Freq: Two times a day (BID) | ORAL | Status: DC
  Administered 2020-10-04 – 2020-10-09 (×10): 45 mL
  Filled 2020-10-04 (×10): qty 45

## 2020-10-04 MED ORDER — ENSURE ENLIVE PO LIQD
237.0000 mL | Freq: Three times a day (TID) | ORAL | Status: DC
  Administered 2020-10-04 – 2020-10-09 (×14): 237 mL via ORAL

## 2020-10-04 MED ORDER — PROSOURCE TF PO LIQD
45.0000 mL | Freq: Every day | ORAL | Status: DC
  Administered 2020-10-04: 45 mL

## 2020-10-04 NOTE — Progress Notes (Addendum)
Trauma/Critical Care Follow Up Note  Subjective:    Overnight Issues:   Objective:  Vital signs for last 24 hours: Temp:  [97.9 F (36.6 C)-99.6 F (37.6 C)] 97.9 F (36.6 C) (05/19 0800) Pulse Rate:  [83-97] 95 (05/19 0900) Resp:  [12-20] 19 (05/19 0900) BP: (104-145)/(68-89) 145/84 (05/19 0900) SpO2:  [87 %-97 %] 94 % (05/19 0900) FiO2 (%):  [35 %-60 %] 60 % (05/19 0821) Weight:  [83.4 kg] 83.4 kg (05/19 0500)  Hemodynamic parameters for last 24 hours:    Intake/Output from previous day: 05/18 0701 - 05/19 0700 In: 2500 [P.O.:620; NG/GT:1880] Out: 2550 [Urine:2550]  Intake/Output this shift: No intake/output data recorded.  Vent settings for last 24 hours: Vent Mode: Stand-by FiO2 (%):  [35 %-60 %] 60 %  Physical Exam:  Gen: comfortable, no distress Neuro: non-focal exam HEENT: PERRL Neck: supple CV: RRR Pulm: unlabored breathing on TC, 40%, weaned to 30% on my exam Abd: soft, NT GU: clear yellow urine Extr: wwp, no edema   Results for orders placed or performed during the hospital encounter of 08/17/20 (from the past 24 hour(s))  Glucose, capillary     Status: Abnormal   Collection Time: 10/03/20 11:50 AM  Result Value Ref Range   Glucose-Capillary 167 (H) 70 - 99 mg/dL  Glucose, capillary     Status: Abnormal   Collection Time: 10/03/20  4:13 PM  Result Value Ref Range   Glucose-Capillary 132 (H) 70 - 99 mg/dL  Glucose, capillary     Status: Abnormal   Collection Time: 10/03/20  8:01 PM  Result Value Ref Range   Glucose-Capillary 170 (H) 70 - 99 mg/dL  Glucose, capillary     Status: Abnormal   Collection Time: 10/03/20 11:47 PM  Result Value Ref Range   Glucose-Capillary 165 (H) 70 - 99 mg/dL  CBC     Status: Abnormal   Collection Time: 10/04/20  2:13 AM  Result Value Ref Range   WBC 13.5 (H) 4.0 - 10.5 K/uL   RBC 3.49 (L) 4.22 - 5.81 MIL/uL   Hemoglobin 10.1 (L) 13.0 - 17.0 g/dL   HCT 97.9 (L) 89.2 - 11.9 %   MCV 92.8 80.0 - 100.0 fL    MCH 28.9 26.0 - 34.0 pg   MCHC 31.2 30.0 - 36.0 g/dL   RDW 41.7 (H) 40.8 - 14.4 %   Platelets 279 150 - 400 K/uL   nRBC 0.0 0.0 - 0.2 %  Glucose, capillary     Status: Abnormal   Collection Time: 10/04/20  3:40 AM  Result Value Ref Range   Glucose-Capillary 145 (H) 70 - 99 mg/dL  Glucose, capillary     Status: Abnormal   Collection Time: 10/04/20  8:19 AM  Result Value Ref Range   Glucose-Capillary 138 (H) 70 - 99 mg/dL   Comment 1 Notify RN    Comment 2 Document in Chart     Assessment & Plan: The plan of care was discussed with the bedside nurse for the day, who is in agreement with this plan and no additional concerns were raised.   Present on Admission: . Thoracic spine fracture (HCC)    LOS: 48 days   Additional comments:I reviewed the patient's new clinical lab test results.   and I reviewed the patients new imaging test results.    Fall from height  R PTX, B effusion Multiple B rib fxs- these are at the junction of the thoracic vertebral bodies, pain control 3 column  T8 vertebral body fx-perDr. Jones,non-op now, plan mobilize in brace once off vent. Needs CTO brace for HOB>30 degrees. Will discuss timing of lifting restrictions T7 neural arch fx- per nsgy R TVP T4-8 - pain control, per nsgy R C7 TVP fx through foramen- C collar per Dr. Yetta Barre, CTAnegative Large scalp laceration/right eyebrow laceration- repair by Dr. Lovick4/05/2020, sutures removed Right neck abrasion/skin tear- bacitracin BID Facial ecchymosis- CT maxillofacialdemonstrates no facial bone fractures. Some anterior subluxation of the right mandibular condyle HTN- home lopressor 25mg  daily CAD/H/O MI/DES- on home ASA Acute hypercarbic ventilator dependent respiratory failure/COPD-Trach/PEG 4/18by Dr. 03-05-1974. HTC 24h, on 40% this AM, weaned to 35%. Downsize to 4CL trach today. Rash on back- benadryl Depression/anxiety- home bupropionandbuspar BPH- home terazosin, repeatedly  failedvoiding trials, bethanachol. Home flomax as ordered now that he is taking PO. Cont I&O q6h R shoulder pain - R shoulder XR, plan for MRI if negtaive, but NRI can also be performed as o/p ABL anemia- stable FEN-d/c TF, eating ~75% of meals plus ensure VTE-LMWH Dispo- ICU;HTC 24/7, wean FiO2 as able so we can consider CIR(once FiO2 35%),   Critical Care Total Time: 35 minutes  Janee Morn, MD Trauma & General Surgery Please use [email or web address] to contact on call provider  10/04/2020  *Care during the described time interval was provided by me. I have reviewed this patient's available data, including medical history, events of note, physical examination and test results as part of my evaluation.

## 2020-10-04 NOTE — Progress Notes (Signed)
Patients trach changed to #4 shiley cuffless without complications. RTx2 assist. Good color change on CO2 and new trach ties secure. RT will continue to monitor.

## 2020-10-04 NOTE — Progress Notes (Addendum)
Nutrition Follow-up  DOCUMENTATION CODES:   Not applicable  INTERVENTION:   D/C TF  45 ml ProSource BID via PEG  D/C free water  MVI with minerals daily   Encourage PO intake  Ensure Enlive po TID, each supplement provides 350 kcal and 20 grams of protein - vanilla   Magic cup TID with meals, each supplement provides 290 kcal and 9 grams of protein - vanilla    NUTRITION DIAGNOSIS:   Increased nutrient needs related to  (trauma) as evidenced by estimated needs. Ongoing.   GOAL:   Patient will meet greater than or equal to 90% of their needs Met PO diet and supplements   MONITOR:   Vent status,TF tolerance  REASON FOR ASSESSMENT:   Ventilator,Consult Enteral/tube feeding initiation and management  ASSESSMENT:   Pt with PMH of COPD followed by Dr Melvyn Novas, HTN, CAD s/p stent who was admitted after fall over 1/2 wall from the second story landing with multiple rib fxs, R pneumothorax, large scalp laceration, and T8 vertebral body fxs, T7, T4-8, C7 fxs.  Pt discussed during ICU rounds and with RN.  Per RN pt loves vanilla ensure and Magic cups, pt eating ~ 75% of meals.   4/18 s/p trach and PEG 5/11 diet advanced to Dysphagia 2 with thin liquids 5/16 change to nocturnal TF 5/19 D/C TF  Medications reviewed and include: colace, folic acid, SSI, MVI with minerals, miralax, thiamine  Labs reviewed:  CBG's: 138-170  58F PEG  UOP: 2550 ml  I&O: +5 L  Diet Order:   Diet Order            DIET DYS 2 Room service appropriate? No; Fluid consistency: Thin  Diet effective now                 EDUCATION NEEDS:   No education needs have been identified at this time  Skin:  DTI: vertebral column   Last BM:  5/17  Height:   Ht Readings from Last 1 Encounters:  08/17/20 '5\' 8"'  (1.727 m)    Weight:   Wt Readings from Last 1 Encounters:  10/04/20 83.4 kg    BMI:  Body mass index is 27.96 kg/m.  Estimated Nutritional Needs:   Kcal:   2000-2200  Protein:  120-135 grams  Fluid:  >2 L/day  Lockie Pares., RD, LDN, CNSC See AMiON for contact information

## 2020-10-04 NOTE — Progress Notes (Signed)
Inpatient Rehab Admissions Coordinator:   Met with pt at bedside to briefly discuss CIR.  He is open to rehab, and we discussed ELOS of 2-3 weeks with goals of supervision.  We discussed current O2 needs are just outside of what we can handle on CIR.  He does state that he does not have anyone to help at home, but per trauma rounds, plan is to return home with his sister who is very supportive.  I will touch base with her in the next day or so and follow for O2 needs.  Goal for CIR would be 35% FiO2 and maintaining saturations >85% on no more than 10L, preferably 8L.    Shann Medal, PT, DPT Admissions Coordinator [phone] 10/04/20  2:29 PM

## 2020-10-04 NOTE — Progress Notes (Signed)
Physical Therapy Treatment Patient Details Name: Charles Weeks MRN: [id number] DOB: 11-24-1965 Today's Date: 10/04/2020    History of Present Illness 54 y.o. male who presented 4/1 after falling down about 11 stairs and sustained R PTX, anterior subluxation of R mandibular condyle, multiple bil ribs fxs at junction of thoracic vertebral bodies T5-8 on R and T7-8 on L, 3 column T8 vertebral body fx, T7 neural arch fx, R TVP T4-8 fxs, and R C7 TVP fx through foramen. R chest tube inserted 4/1. Pt found to be hypoxic and was intubated 4/2. No evidence of vascular injury to neck, facial fx, acute intracranial injury, lumbar spine fx, R elbow acute abnormality, or pelvic fx identified on imaging. S/p trach and PEG placement 4/18. S/p L chest tube placement 4/19. PMH: HTN, COPD, CAD, s/p MI with DES RCA in Jan 2020 just on ASA, depression/anxiety, and BPH.    PT Comments    Pt making good progress with activity tolerance, ambulating x2 bouts of short distances up to ~4-6 ft each with a RW and minA this date. However, pt's sats do decline with mobility, see General Comments below, and he needs extended periods of time (>5 min) resting in sitting to prepare for next bout. Pt with poor sequencing and RW management, particularly with turning or pulling it posteriorly. Pt with no recall of precautions, thus re-educated and provided handouts. Will continue to follow acutely. Current recommendations remain appropriate.  Of note, pt reports R shoulder pain and demonstrates decreased AROM and signs/symptoms of possible rotator cuff injury, MD notified.    Follow Up Recommendations  CIR     Equipment Recommendations  Wheelchair (measurements PT);Wheelchair cushion (measurements PT);Rolling walker with 5" wheels;3in1 (PT)    Recommendations for Other Services       Precautions / Restrictions Precautions Precautions: Cervical;Back Precaution Booklet Issued: Yes (comment) Precaution Comments: trach  collar, condom cath, TLSO done with rolling if HOB >30 , ccollar at all times, abdominal binder to protect PEG Required Braces or Orthoses: Cervical Brace;Spinal Brace Cervical Brace: Hard collar;At all times Spinal Brace: Thoracolumbosacral orthotic;Applied in supine position Restrictions Weight Bearing Restrictions: No    Mobility  Bed Mobility Overal bed mobility: Needs Assistance Bed Mobility: Rolling;Sidelying to Sit Rolling: Min guard Sidelying to sit: Mod assist       General bed mobility comments: min guard for rolling to left, cuing to flex knees and reach across body for rail, exiting towards left side of bed with modA for trunk elevation. Cues for scooting forward to edge of bed    Transfers Overall transfer level: Needs assistance Equipment used: Rolling walker (2 wheeled) Transfers: Sit to/from Stand Sit to Stand: Min assist         General transfer comment: MinA to rise from edge of bed 1x and recliner 1x and initially steady, cuing for hand placement.  Ambulation/Gait Ambulation/Gait assistance: Min assist Gait Distance (Feet):  (x2 bouts of ~4 ft > ~6 ft) Assistive device: Rolling walker (2 wheeled) Gait Pattern/deviations: Decreased stride length;Trunk flexed;Narrow base of support;Step-through pattern;Shuffle Gait velocity: decreased Gait velocity interpretation: <1.31 ft/sec, indicative of household ambulator General Gait Details: Pt taking small, shuffling steps from bed to L to recliner first bout and anterior <> posterior from recliner second bout, needing cues to sequence steps and RW management while maintaining precautions. MinA for steadying. Particular difficulty with turning RW and pulling RW posteriorly, tilting it at times. Pt fatigues quickly and desats with mobility.   Stairs  Wheelchair Mobility    Modified Rankin (Stroke Patients Only) Modified Rankin (Stroke Patients Only) Pre-Morbid Rankin Score: No symptoms Modified  Rankin: Moderately severe disability     Balance Overall balance assessment: Needs assistance Sitting-balance support: Single extremity supported Sitting balance-Leahy Scale: Poor Sitting balance - Comments: Reliant on single UE support-bilateral UE support. Close supervision for safety   Standing balance support: Bilateral upper extremity supported Standing balance-Leahy Scale: Poor Standing balance comment: Pt requires Bil UE support for stability with minA for steadying.                            Cognition Arousal/Alertness: Awake/alert Behavior During Therapy: WFL for tasks assessed/performed Overall Cognitive Status: Impaired/Different from baseline Area of Impairment: Following commands;Awareness;Problem solving;Memory               Rancho Levels of Cognitive Functioning Rancho Los Amigos Scales of Cognitive Functioning: Confused/appropriate (emerging VII)     Memory: Decreased recall of precautions Following Commands: Follows multi-step commands inconsistently;Follows multi-step commands with increased time;Follows one step commands consistently;Follows one step commands with increased time   Awareness: Emergent Problem Solving: Slow processing;Difficulty sequencing;Requires verbal cues General Comments: PMV donned, able to verbalize basic needs and makes jokes on occasion. Pt follows multi-step commands with increased time and repetition. Pt with slow processing and needing cues to sequence tasks, particularly RW management. No recall of precautions.      Exercises      General Comments General comments (skin integrity, edema, etc.): Trach 10 L 40% currently . Sats as low as 83% with functional mobility with unreliable wave forms noted but consistent waveforms at 85%, recovers to >/= 90% with seated rest breaks. RN aware      Pertinent Vitals/Pain Pain Assessment: Faces Faces Pain Scale: Hurts even more Pain Location: back, neck, R shoulder Pain  Descriptors / Indicators: Discomfort;Grimacing;Guarding Pain Intervention(s): Limited activity within patient's tolerance;Monitored during session;Repositioned    Home Living                      Prior Function            PT Goals (current goals can now be found in the care plan section) Acute Rehab PT Goals Patient Stated Goal: to improve PT Goal Formulation: With patient Time For Goal Achievement: 10/16/20 Potential to Achieve Goals: Good Progress towards PT goals: Progressing toward goals    Frequency    Min 4X/week      PT Plan Current plan remains appropriate    Co-evaluation              AM-PAC PT "6 Clicks" Mobility   Outcome Measure  Help needed turning from your back to your side while in a flat bed without using bedrails?: A Little Help needed moving from lying on your back to sitting on the side of a flat bed without using bedrails?: A Little Help needed moving to and from a bed to a chair (including a wheelchair)?: A Little Help needed standing up from a chair using your arms (e.g., wheelchair or bedside chair)?: A Little Help needed to walk in hospital room?: A Lot Help needed climbing 3-5 steps with a railing? : Total 6 Click Score: 15    End of Session Equipment Utilized During Treatment: Gait belt;Back brace;Cervical collar;Oxygen Activity Tolerance: Patient tolerated treatment well Patient left: in chair;with call bell/phone within reach;with chair alarm set;with nursing/sitter in room Nurse Communication: Mobility  status;Other (comment) (sats) PT Visit Diagnosis: Muscle weakness (generalized) (M62.81);Difficulty in walking, not elsewhere classified (R26.2);Unsteadiness on feet (R26.81);Other abnormalities of gait and mobility (R26.89)     Time: 3790-2409 PT Time Calculation (min) (ACUTE ONLY): 28 min  Charges:  $Gait Training: 8-22 mins $Therapeutic Activity: 8-22 mins                     Raymond Gurney, PT, DPT Acute  Rehabilitation Services  Pager: [phone] Office: [phone]    Jewel Baize 10/04/2020, 9:55 AM

## 2020-10-05 LAB — GLUCOSE, CAPILLARY
Glucose-Capillary: 110 mg/dL — ABNORMAL HIGH (ref 70–99)
Glucose-Capillary: 141 mg/dL — ABNORMAL HIGH (ref 70–99)
Glucose-Capillary: 169 mg/dL — ABNORMAL HIGH (ref 70–99)
Glucose-Capillary: 132 mg/dL — ABNORMAL HIGH (ref 70–99)
Glucose-Capillary: 128 mg/dL — ABNORMAL HIGH (ref 70–99)
Glucose-Capillary: 172 mg/dL — ABNORMAL HIGH (ref 70–99)

## 2020-10-05 NOTE — Progress Notes (Signed)
Physical Therapy Treatment Patient Details Name: Charles Weeks MRN: [id number] DOB: [birth year] Today's Date: 10/05/2020    History of Present Illness 55 y.o. male who presented 4/1 after falling down about 11 stairs and sustained R PTX, anterior subluxation of R mandibular condyle, multiple bil ribs fxs at junction of thoracic vertebral bodies T5-8 on R and T7-8 on L, 3 column T8 vertebral body fx, T7 neural arch fx, R TVP T4-8 fxs, and R C7 TVP fx through foramen. R chest tube inserted 4/1. Pt found to be hypoxic and was intubated 4/2. No evidence of vascular injury to neck, facial fx, acute intracranial injury, lumbar spine fx, R elbow acute abnormality, or pelvic fx identified on imaging. S/p trach and PEG placement 4/18. S/p L chest tube placement 4/19. PMH: HTN, COPD, CAD, s/p MI with DES RCA in Jan 2020 just on ASA, depression/anxiety, and BPH.    PT Comments    Pt remains very motivated to participate and improve. Pt progressing with mobility, ambulating an increased distance of up to ~8 ft with a RW, chair follow, and minA during one bout this date. However, his sats continue to decline into lower 80s% with him needing several minutes resting in sitting instructed on pursed lip breathing for them to rebound. Will continue to follow acutely. Current recommendations remain appropriate.   Follow Up Recommendations  CIR     Equipment Recommendations  Wheelchair (measurements PT);Wheelchair cushion (measurements PT);Rolling walker with 5" wheels;3in1 (PT)    Recommendations for Other Services       Precautions / Restrictions Precautions Precautions: Cervical;Back Precaution Booklet Issued: Yes (comment) Precaution Comments: trach collar, condom cath, TLSO done with rolling if HOB >30 , ccollar at all times, abdominal binder to protect PEG Required Braces or Orthoses: Cervical Brace;Spinal Brace Cervical Brace: Hard collar;At all times Spinal Brace: Thoracolumbosacral  orthotic;Applied in supine position Restrictions Weight Bearing Restrictions: No    Mobility  Bed Mobility Overal bed mobility: Needs Assistance Bed Mobility: Rolling;Sit to Sidelying Rolling: Min guard       Sit to sidelying: Min assist General bed mobility comments: MinA to manage legs back into bed, cuing to descend onto elbow to control trunk, success. Min guard to roll back to supine.    Transfers Overall transfer level: Needs assistance Equipment used: Rolling walker (2 wheeled) Transfers: Sit to/from Stand Sit to Stand: Min assist         General transfer comment: MinA to rise from recliner 2x and initially steady, cuing for hand placement.  Ambulation/Gait Ambulation/Gait assistance: Min assist Gait Distance (Feet): 8 Feet (x2 bouts of ~8 ft > ~5 ft) Assistive device: Rolling walker (2 wheeled) Gait Pattern/deviations: Decreased stride length;Narrow base of support;Step-through pattern Gait velocity: decreased Gait velocity interpretation: <1.31 ft/sec, indicative of household ambulator General Gait Details: Pt with improved bil step length and feet clearance, but continues to need cues on occasion. Pt with difficulty managing RW with posterior stepping, needing cues to keep it on floor and not tilt it. MinA for steadying. Pt fatigues as he desats.   Stairs             Wheelchair Mobility    Modified Rankin (Stroke Patients Only) Modified Rankin (Stroke Patients Only) Pre-Morbid Rankin Score: No symptoms Modified Rankin: Moderately severe disability     Balance Overall balance assessment: Needs assistance Sitting-balance support: No upper extremity supported;Feet supported Sitting balance-Leahy Scale: Fair Sitting balance - Comments: Capable of sitting without UE support but prefers UE support for relieving  back pain.   Standing balance support: Bilateral upper extremity supported Standing balance-Leahy Scale: Poor Standing balance comment: Pt  requires Bil UE support for stability with minA for steadying.                            Cognition Arousal/Alertness: Awake/alert Behavior During Therapy: WFL for tasks assessed/performed Overall Cognitive Status: Impaired/Different from baseline Area of Impairment: Awareness;Problem solving;Memory               Rancho Levels of Cognitive Functioning Rancho Los Amigos Scales of Cognitive Functioning: Confused/appropriate (emerging VII)     Memory: Decreased recall of precautions;Decreased short-term memory     Awareness: Emergent Problem Solving: Slow processing;Difficulty sequencing;Requires verbal cues General Comments: PMV donned, able to verbalize basic needs and makes jokes during session. Pt with slow processing and needing repeated cues to sequence tasks, particularly RW management.      Exercises Other Exercises Other Exercises: Pursed lip breathing    General Comments General comments (skin integrity, edema, etc.): trach collar 40% 10 L; SpO2 as low as 85% with mobility, needing several minutes in sitting to rebound to >/= 90%      Pertinent Vitals/Pain Pain Assessment: Faces Faces Pain Scale: Hurts even more Pain Location: back, neck, R shoulder Pain Descriptors / Indicators: Discomfort;Grimacing;Guarding Pain Intervention(s): Limited activity within patient's tolerance;Monitored during session;Repositioned    Home Living                      Prior Function            PT Goals (current goals can now be found in the care plan section) Acute Rehab PT Goals Patient Stated Goal: to improve PT Goal Formulation: With patient Time For Goal Achievement: 10/16/20 Potential to Achieve Goals: Good Progress towards PT goals: Progressing toward goals    Frequency    Min 4X/week      PT Plan Current plan remains appropriate    Co-evaluation              AM-PAC PT "6 Clicks" Mobility   Outcome Measure  Help needed turning  from your back to your side while in a flat bed without using bedrails?: A Little Help needed moving from lying on your back to sitting on the side of a flat bed without using bedrails?: A Little Help needed moving to and from a bed to a chair (including a wheelchair)?: A Little Help needed standing up from a chair using your arms (e.g., wheelchair or bedside chair)?: A Little Help needed to walk in hospital room?: A Lot Help needed climbing 3-5 steps with a railing? : Total 6 Click Score: 15    End of Session Equipment Utilized During Treatment: Gait belt;Back brace;Cervical collar;Oxygen Activity Tolerance: Patient tolerated treatment well Patient left: with call bell/phone within reach;in bed;with bed alarm set Nurse Communication: Mobility status;Other (comment) (sats) PT Visit Diagnosis: Muscle weakness (generalized) (M62.81);Difficulty in walking, not elsewhere classified (R26.2);Unsteadiness on feet (R26.81);Other abnormalities of gait and mobility (R26.89)     Time: 1544-1610 PT Time Calculation (min) (ACUTE ONLY): 26 min  Charges:  $Gait Training: 8-22 mins $Therapeutic Activity: 8-22 mins                     Raymond Gurney, PT, DPT Acute Rehabilitation Services  Pager: [phone] Office: [phone]    Charles Weeks 10/05/2020, 5:26 PM

## 2020-10-05 NOTE — Progress Notes (Signed)
Patients trach changed back to #6 shiley cuffless for secretion management. RTx2 assist. MD and RN at bedside. Good color change on CO2 and new trach ties secure.

## 2020-10-05 NOTE — Progress Notes (Addendum)
Patient ID: Charles Weeks, male   DOB: 05/04/1966, 55 y.o.   MRN: [id number] Follow up - Trauma Critical Care  Patient Details:    Charles Weeks is an 55 y.o. male.  Lines/tubes : Gastrostomy/Enterostomy Percutaneous endoscopic gastrostomy (PEG) 20 Fr. LUQ (Active)  Surrounding Skin Dry;Intact 10/03/20 0744  Tube Status Patent 10/03/20 0744  Drainage Appearance None 10/03/20 0744  Dressing Status Clean;Dry;Intact 10/03/20 0744  Dressing Intervention Other (Comment) 10/03/20 0744  Dressing Type Abdominal Binder;Split gauze 10/03/20 0744  Dressing Change Due 10/04/20 10/03/20 0744  G Port Intake (mL) 100 ml 09/23/20 1600  Output (mL) 0 mL 09/23/20 1000     External Urinary Catheter (Active)  Collection Container Standard drainage bag 10/03/20 0744  Suction (Verified suction is between 40-80 mmHg) N/A (Patient has condom catheter) 10/03/20 0744  Securement Method Securing device (Describe) 10/03/20 0744  Site Assessment Clean;Intact 10/03/20 0744  Intervention Equipment Changed 10/03/20 0536    Microbiology/Sepsis markers: Results for orders placed or performed during the hospital encounter of 08/17/20  Resp Panel by RT-PCR (Flu A&B, Covid) Nasopharyngeal Swab     Status: None   Collection Time: 08/17/20  3:40 PM   Specimen: Nasopharyngeal Swab; Nasopharyngeal(NP) swabs in vial transport medium  Result Value Ref Range Status   SARS Coronavirus 2 by RT PCR NEGATIVE NEGATIVE Final    Comment: (NOTE) SARS-CoV-2 target nucleic acids are NOT DETECTED.  The SARS-CoV-2 RNA is generally detectable in upper respiratory specimens during the acute phase of infection. The lowest concentration of SARS-CoV-2 viral copies this assay can detect is 138 copies/mL. A negative result does not preclude SARS-Cov-2 infection and should not be used as the sole basis for treatment or other patient management decisions. A negative result may occur with  improper specimen collection/handling,  submission of specimen other than nasopharyngeal swab, presence of viral mutation(s) within the areas targeted by this assay, and inadequate number of viral copies(<138 copies/mL). A negative result must be combined with clinical observations, patient history, and epidemiological information. The expected result is Negative.  Fact Sheet for Patients:  BloggerCourse.com  Fact Sheet for Healthcare Providers:  SeriousBroker.it  This test is no t yet approved or cleared by the [place] FDA and  has been authorized for detection and/or diagnosis of SARS-CoV-2 by FDA under an Emergency Use Authorization (EUA). This EUA will remain  in effect (meaning this test can be used) for the duration of the COVID-19 declaration under Section 564(b)(1) of the Act, 21 U.S.C.section 360bbb-3(b)(1), unless the authorization is terminated  or revoked sooner.       Influenza A by PCR NEGATIVE NEGATIVE Final   Influenza B by PCR NEGATIVE NEGATIVE Final    Comment: (NOTE) The Xpert Xpress SARS-CoV-2/FLU/RSV plus assay is intended as an aid in the diagnosis of influenza from Nasopharyngeal swab specimens and should not be used as a sole basis for treatment. Nasal washings and aspirates are unacceptable for Xpert Xpress SARS-CoV-2/FLU/RSV testing.  Fact Sheet for Patients: BloggerCourse.com  Fact Sheet for Healthcare Providers: SeriousBroker.it  This test is not yet approved or cleared by the [place] FDA and has been authorized for detection and/or diagnosis of SARS-CoV-2 by FDA under an Emergency Use Authorization (EUA). This EUA will remain in effect (meaning this test can be used) for the duration of the COVID-19 declaration under Section 564(b)(1) of the Act, 21 U.S.C. section 360bbb-3(b)(1), unless the authorization is terminated or revoked.  Performed at [facility], [address], [place],  [place] [address]   Surgical PCR screen     Status: None   Collection Time: 08/19/20  7:38 AM   Specimen: Nasal Mucosa; Nasal Swab  Result Value Ref Range Status   MRSA, PCR NEGATIVE NEGATIVE Final   Staphylococcus aureus NEGATIVE NEGATIVE Final    Comment: (NOTE) The Xpert SA Assay (FDA approved for NASAL specimens in patients 10 years of age and older), is one component of a comprehensive surveillance program. It is not intended to diagnose infection nor to guide or monitor treatment. Performed at [facility], [address], [place], [place] [address]   MRSA PCR Screening     Status: None   Collection Time: 09/19/20  7:40 AM   Specimen: Nasal Mucosa; Nasopharyngeal  Result Value Ref Range Status   MRSA by PCR NEGATIVE NEGATIVE Final    Comment:        The GeneXpert MRSA Assay (FDA approved for NASAL specimens only), is one component of a comprehensive MRSA colonization surveillance program. It is not intended to diagnose MRSA infection nor to guide or monitor treatment for MRSA infections. Performed at [facility], [address], [place], [place] [address]     Anti-infectives:  Anti-infectives (From admission, onward)   Start     Dose/Rate Route Frequency Ordered Stop   08/19/20 0845  cefTRIAXone (ROCEPHIN) 2 g in sodium chloride 0.9 % 100 mL IVPB  Status:  Discontinued        2 g 200 mL/hr over 30 Minutes Intravenous Every 24 hours 08/19/20 0753 08/25/20 1304   08/17/20 1515  ceFAZolin (ANCEF) IVPB 2g/100 mL premix        2 g 200 mL/hr over 30 Minutes Intravenous  Once 08/17/20 1500 08/17/20 1530      Best Practice/Protocols:  VTE Prophylaxis: Lovenox (prophylaxtic dose) .  Consults: Treatment Team:  Tia Alert, MD    Studies:    Events:  Subjective:    Overnight Issues:   Objective:  Vital signs for last 24 hours: Temp:  [97.8 F (36.6 C)-98.7 F (37.1 C)] 97.8 F (36.6 C) (05/20 0400) Pulse Rate:   [78-100] 93 (05/20 0832) Resp:  [9-19] 18 (05/20 0832) BP: (113-145)/(74-89) 133/89 (05/20 0832) SpO2:  [84 %-97 %] 90 % (05/20 0832) FiO2 (%):  [40 %-60 %] 60 % (05/20 0832) Weight:  [82.3 kg] 82.3 kg (05/20 0400)  Hemodynamic parameters for last 24 hours:    Intake/Output from previous day: 05/19 0701 - 05/20 0700 In: 1140 [P.O.:1040; NG/GT:100] Out: 2775 [Urine:2775]  Intake/Output this shift: No intake/output data recorded.  Vent settings for last 24 hours: FiO2 (%):  [40 %-60 %] 60 %  Physical Exam:  General: alert and no respiratory distress, RT at bedside Neuro: alert and F/C HEENT/Neck: trach-clean, intact Resp: strong cough, clear to auscultation bilaterally CVS: RRR GI: soft, NT, PEG Extremities: calves soft  Results for orders placed or performed during the hospital encounter of 08/17/20 (from the past 24 hour(s))  Glucose, capillary     Status: Abnormal   Collection Time: 10/04/20 11:44 AM  Result Value Ref Range   Glucose-Capillary 220 (H) 70 - 99 mg/dL   Comment 1 Notify RN    Comment 2 Document in Chart   Glucose, capillary     Status: Abnormal   Collection Time: 10/04/20  4:08 PM  Result Value Ref Range   Glucose-Capillary 133 (H) 70 - 99 mg/dL   Comment 1 Notify RN    Comment 2  Document in Chart   Glucose, capillary     Status: Abnormal   Collection Time: 10/04/20  7:58 PM  Result Value Ref Range   Glucose-Capillary 146 (H) 70 - 99 mg/dL  Glucose, capillary     Status: Abnormal   Collection Time: 10/04/20 11:34 PM  Result Value Ref Range   Glucose-Capillary 137 (H) 70 - 99 mg/dL  Glucose, capillary     Status: Abnormal   Collection Time: 10/05/20  3:34 AM  Result Value Ref Range   Glucose-Capillary 110 (H) 70 - 99 mg/dL  Glucose, capillary     Status: Abnormal   Collection Time: 10/05/20  7:58 AM  Result Value Ref Range   Glucose-Capillary 141 (H) 70 - 99 mg/dL    Assessment & Plan: Present on Admission: . Thoracic spine fracture  (HCC)    LOS: 49 days   Additional comments:I reviewed the patient's new clinical lab test results. and CXR Fall from height  R PTX, B effusion Multiple B rib fxs- these are at the junction of the thoracic vertebral bodies, pain control 3 column T8 vertebral body fx-perDr. Jones,non-op now, plan mobilize in brace once off vent. Needs CTO brace for HOB>30 degrees. Will discuss timing of lifting restrictions T7 neural arch fx- per nsgy R TVP T4-8 - pain control, per nsgy R C7 TVP fx through foramen- C collar per Dr. Yetta Barre, CTAnegative Large scalp laceration/right eyebrow laceration- repair by Dr. Lovick4/05/2020, sutures removed Right neck abrasion/skin tear- bacitracin BID Facial ecchymosis- CT maxillofacialdemonstrates no facial bone fractures. Some anterior subluxation of the right mandibular condyle HTN- home lopressor 25mg  daily CAD/H/O MI/DES- on home ASA Acute hypercarbic ventilator dependent respiratory failure/COPD-Trach/PEG 4/18by Dr. 03-05-1974. HTC 24h, tolerated 30-40% yesterday but after exchange to 4 cuffless yesterday FiO2 requirment went back up to 60%. Still having a lot of secretions. RT changed back to 6 cuffless today -- wean FiO2 to goal of 30-35% on 8-10L.  Rash on back- benadryl Depression/anxiety- home bupropion and buspar BPH- home terazosin, repeatedly failedvoiding trials,  bethanachol. Home flomax as ordered now that he is taking PO. Cont I&O q6h ABL anemia- stable FEN-TF overnight, eating during the day VTE-LMWH Dispo- ICU;HTC 24/7, goal FiO2 30-35% today, PT/OT   Critical Care Total Time*: 20 Minutes   10-10, PA-C  Trauma & General Surgery Use [email or web address] to contact on call provider  10/05/2020  *Care during the described time interval was provided by me. I have reviewed this patient's available data, including medical history, events of note, physical examination and test results as part of my evaluation.

## 2020-10-05 NOTE — Progress Notes (Signed)
OT NOTE  Pt with urgency to void and get to [facility]. Pt completed bed mobility min guard with HOB elevated this session on the Left side. pT voiding large bowel movement with (A) to peri care. Pt upright in chair watching westerns at the end of session. Recommendation CIR     10/05/20 1000  OT Visit Information  Last OT Received On 10/05/20  Assistance Needed +1  History of Present Illness 55 y.o. male who presented 4/1 after falling down about 11 stairs and sustained R PTX, anterior subluxation of R mandibular condyle, multiple bil ribs fxs at junction of thoracic vertebral bodies T5-8 on R and T7-8 on L, 3 column T8 vertebral body fx, T7 neural arch fx, R TVP T4-8 fxs, and R C7 TVP fx through foramen. R chest tube inserted 4/1. Pt found to be hypoxic and was intubated 4/2. No evidence of vascular injury to neck, facial fx, acute intracranial injury, lumbar spine fx, R elbow acute abnormality, or pelvic fx identified on imaging. S/p trach and PEG placement 4/18. S/p L chest tube placement 4/19. PMH: HTN, COPD, CAD, s/p MI with DES RCA in Jan 2020 just on ASA, depression/anxiety, and BPH.  Precautions  Precautions Cervical;Back  Precaution Booklet Issued Yes (comment)  Precaution Comments trach collar, condom cath, TLSO done with rolling if HOB >30 , ccollar at all times, abdominal binder to protect PEG  Required Braces or Orthoses Cervical Brace;Spinal Brace  Cervical Brace Hard collar;At all times  Spinal Brace TLSO;Applied in supine position  Pain Assessment  Pain Assessment No/denies pain  Cognition  Arousal/Alertness Awake/alert  Behavior During Therapy WFL for tasks assessed/performed  Overall Cognitive Status Impaired/Different from baseline  Area of Impairment Awareness  Awareness Emergent  ADL  Overall ADL's  Needs assistance/impaired  Eating/Feeding Set up;Sitting  Toilet Transfer Moderate assistance;Stand-pivot;[name];RW  Toileting- Clothing Manipulation and Hygiene Minimal  assistance;Sit to/from stand  Toileting - Clothing Manipulation Details (indicate cue type and reason) pt with (A) to for steady doing peri care. pt with decreased saturations with attempt. OT finishing task max (A) .  General ADL Comments pt required > 4 minutes to rebound to 90% after static standing with peri care. pt positioned in recliner  Bed Mobility  Overal bed mobility Needs Assistance  Bed Mobility Rolling;Supine to Sit  Rolling Min guard  Sidelying to sit Min guard  General bed mobility comments ho b 40 degrees exiting L side for patient pushing up with L UE  Balance  Overall balance assessment Needs assistance  Sitting-balance support Bilateral upper extremity supported;Feet supported  Sitting balance-Leahy Scale Fair  Standing balance support Bilateral upper extremity supported;During functional activity  Standing balance-Leahy Scale Poor  Standing balance comment reliant on RW  Rancho Levels of Cognitive Functioning  Rancho Los Amigos Scales of Cognitive Functioning VI  Transfers  Overall transfer level Needs assistance  Transfers Sit to/from Stand  Sit to Stand Min assist  General Comments  General comments (skin integrity, edema, etc.) VSS HR 88 trach collar 40% 10 L  OT - End of Session  Equipment Utilized During Treatment Oxygen;Cervical collar;Back brace  Activity Tolerance Patient tolerated treatment well  Patient left in chair;with call bell/phone within reach;with chair alarm set  Nurse Communication Mobility status;Precautions  OT Assessment/Plan  OT Plan Discharge plan remains appropriate  OT Visit Diagnosis Unsteadiness on feet (R26.81);Cognitive communication deficit (R41.841)  OT Frequency (ACUTE ONLY) Min 2X/week  Recommendations for Other Services Rehab consult  Follow Up Recommendations CIR  OT Equipment  Wheelchair (measurements OT);Wheelchair cushion (measurements OT);Hospital bed  AM-PAC OT "6 Clicks" Daily Activity Outcome Measure (Version 2)   Help from another person eating meals? 2  Help from another person taking care of personal grooming? 2  Help from another person toileting, which includes using toliet, bedpan, or urinal? 2  Help from another person bathing (including washing, rinsing, drying)? 2  Help from another person to put on and taking off regular upper body clothing? 2  Help from another person to put on and taking off regular lower body clothing? 2  6 Click Score 12  OT Goal Progression  Progress towards OT goals Progressing toward goals  Acute Rehab OT Goals  Patient Stated Goal to go to the bathroom - transfer to [facility]  OT Goal Formulation With patient  Time For Goal Achievement 10/19/20  Potential to Achieve Goals Good  ADL Goals  Pt Will Perform Grooming with min guard assist;sitting  Additional ADL Goal #1 pt will follow 3 step command  Additional ADL Goal #2 Pt will tolerate static standing for static for 1 minute ADLS  OT Time Calculation  OT Start Time (ACUTE ONLY) 1400  OT General Charges  $OT Visit 1 Visit  OT Treatments  $Self Care/Home Management  8-22 mins   Brynn, OTR/L  Acute Rehabilitation Services Pager: [phone] Office: [phone] .

## 2020-10-06 LAB — GLUCOSE, CAPILLARY
Glucose-Capillary: 102 mg/dL — ABNORMAL HIGH (ref 70–99)
Glucose-Capillary: 136 mg/dL — ABNORMAL HIGH (ref 70–99)
Glucose-Capillary: 147 mg/dL — ABNORMAL HIGH (ref 70–99)
Glucose-Capillary: 153 mg/dL — ABNORMAL HIGH (ref 70–99)
Glucose-Capillary: 158 mg/dL — ABNORMAL HIGH (ref 70–99)
Glucose-Capillary: 134 mg/dL — ABNORMAL HIGH (ref 70–99)

## 2020-10-06 NOTE — Progress Notes (Signed)
Patient ID: Charles Weeks, male   DOB: [birth year], 55 y.o.   MRN: [id number] Follow up - Trauma Critical Care  Patient Details:    Charles Weeks is an 55 y.o. male.  Lines/tubes : Gastrostomy/Enterostomy Percutaneous endoscopic gastrostomy (PEG) 20 Fr. LUQ (Active)  Surrounding Skin Dry;Intact 10/03/20 0744  Tube Status Patent 10/03/20 0744  Drainage Appearance None 10/03/20 0744  Dressing Status Clean;Dry;Intact 10/03/20 0744  Dressing Intervention Other (Comment) 10/03/20 0744  Dressing Type Abdominal Binder;Split gauze 10/03/20 0744  Dressing Change Due 10/04/20 10/03/20 0744  G Port Intake (mL) 100 ml 09/23/20 1600  Output (mL) 0 mL 09/23/20 1000     External Urinary Catheter (Active)  Collection Container Standard drainage bag 10/03/20 0744  Suction (Verified suction is between 40-80 mmHg) N/A (Patient has condom catheter) 10/03/20 0744  Securement Method Securing device (Describe) 10/03/20 0744  Site Assessment Clean;Intact 10/03/20 0744  Intervention Equipment Changed 10/03/20 0536    Microbiology/Sepsis markers: Results for orders placed or performed during the hospital encounter of 08/17/20  Resp Panel by RT-PCR (Flu A&B, Covid) Nasopharyngeal Swab     Status: None   Collection Time: 08/17/20  3:40 PM   Specimen: Nasopharyngeal Swab; Nasopharyngeal(NP) swabs in vial transport medium  Result Value Ref Range Status   SARS Coronavirus 2 by RT PCR NEGATIVE NEGATIVE Final    Comment: (NOTE) SARS-CoV-2 target nucleic acids are NOT DETECTED.  The SARS-CoV-2 RNA is generally detectable in upper respiratory specimens during the acute phase of infection. The lowest concentration of SARS-CoV-2 viral copies this assay can detect is 138 copies/mL. A negative result does not preclude SARS-Cov-2 infection and should not be used as the sole basis for treatment or other patient management decisions. A negative result may occur with  improper specimen collection/handling,  submission of specimen other than nasopharyngeal swab, presence of viral mutation(s) within the areas targeted by this assay, and inadequate number of viral copies(<138 copies/mL). A negative result must be combined with clinical observations, patient history, and epidemiological information. The expected result is Negative.  Fact Sheet for Patients:  BloggerCourse.com  Fact Sheet for Healthcare Providers:  SeriousBroker.it  This test is no t yet approved or cleared by the [place] FDA and  has been authorized for detection and/or diagnosis of SARS-CoV-2 by FDA under an Emergency Use Authorization (EUA). This EUA will remain  in effect (meaning this test can be used) for the duration of the COVID-19 declaration under Section 564(b)(1) of the Act, 21 U.S.C.section 360bbb-3(b)(1), unless the authorization is terminated  or revoked sooner.       Influenza A by PCR NEGATIVE NEGATIVE Final   Influenza B by PCR NEGATIVE NEGATIVE Final    Comment: (NOTE) The Xpert Xpress SARS-CoV-2/FLU/RSV plus assay is intended as an aid in the diagnosis of influenza from Nasopharyngeal swab specimens and should not be used as a sole basis for treatment. Nasal washings and aspirates are unacceptable for Xpert Xpress SARS-CoV-2/FLU/RSV testing.  Fact Sheet for Patients: BloggerCourse.com  Fact Sheet for Healthcare Providers: SeriousBroker.it  This test is not yet approved or cleared by the [place] FDA and has been authorized for detection and/or diagnosis of SARS-CoV-2 by FDA under an Emergency Use Authorization (EUA). This EUA will remain in effect (meaning this test can be used) for the duration of the COVID-19 declaration under Section 564(b)(1) of the Act, 21 U.S.C. section 360bbb-3(b)(1), unless the authorization is terminated or revoked.  Performed at [facility], [address], [place],  [place] [address]   Surgical PCR screen     Status: None   Collection Time: 08/19/20  7:38 AM   Specimen: Nasal Mucosa; Nasal Swab  Result Value Ref Range Status   MRSA, PCR NEGATIVE NEGATIVE Final   Staphylococcus aureus NEGATIVE NEGATIVE Final    Comment: (NOTE) The Xpert SA Assay (FDA approved for NASAL specimens in patients 20 years of age and older), is one component of a comprehensive surveillance program. It is not intended to diagnose infection nor to guide or monitor treatment. Performed at [facility], [address], [place], [place] [address]   MRSA PCR Screening     Status: None   Collection Time: 09/19/20  7:40 AM   Specimen: Nasal Mucosa; Nasopharyngeal  Result Value Ref Range Status   MRSA by PCR NEGATIVE NEGATIVE Final    Comment:        The GeneXpert MRSA Assay (FDA approved for NASAL specimens only), is one component of a comprehensive MRSA colonization surveillance program. It is not intended to diagnose MRSA infection nor to guide or monitor treatment for MRSA infections. Performed at [facility], [address] [place], [place] [address]     Anti-infectives:  Anti-infectives (From admission, onward)   Start     Dose/Rate Route Frequency Ordered Stop   08/19/20 0845  cefTRIAXone (ROCEPHIN) 2 g in sodium chloride 0.9 % 100 mL IVPB  Status:  Discontinued        2 g 200 mL/hr over 30 Minutes Intravenous Every 24 hours 08/19/20 0753 08/25/20 1304   08/17/20 1515  ceFAZolin (ANCEF) IVPB 2g/100 mL premix        2 g 200 mL/hr over 30 Minutes Intravenous  Once 08/17/20 1500 08/17/20 1530      Best Practice/Protocols:  VTE Prophylaxis: Lovenox (prophylaxtic dose) .  Consults: Treatment Team:  Tia Alert, MD    Studies:    Events:  Subjective:    Overnight Issues:  Trach changed to 6 cuffless yesterday due to secretions. On 10L oxygen this morning.  Objective:  Vital signs for last 24 hours: Temp:  [99  F (37.2 C)-99.3 F (37.4 C)] 99.2 F (37.3 C) (05/21 0400) Pulse Rate:  [74-100] 81 (05/21 0600) Resp:  [12-19] 13 (05/21 0600) BP: (112-151)/(67-93) 116/67 (05/21 0600) SpO2:  [90 %-98 %] 96 % (05/21 0600) FiO2 (%):  [40 %-60 %] 40 % (05/21 0300) Weight:  [81.7 kg] 81.7 kg (05/21 0400)  Hemodynamic parameters for last 24 hours:    Intake/Output from previous day: 05/20 0701 - 05/21 0700 In: -  Out: 1700 [Urine:1700]  Intake/Output this shift: No intake/output data recorded.  Vent settings for last 24 hours: FiO2 (%):  [40 %-60 %] 40 %  Physical Exam:  General: sleeping comfortably, no respiratory distress HEENT/Neck: trach in place, clean and dry, on trach collar at 10L/min Resp: normal work of breathing CVS: RRR GI: soft, NT, PEG Extremities: calves soft  Results for orders placed or performed during the hospital encounter of 08/17/20 (from the past 24 hour(s))  Glucose, capillary     Status: Abnormal   Collection Time: 10/05/20 12:22 PM  Result Value Ref Range   Glucose-Capillary 169 (H) 70 - 99 mg/dL  Glucose, capillary     Status: Abnormal   Collection Time: 10/05/20  3:33 PM  Result Value Ref Range   Glucose-Capillary 132 (H) 70 - 99 mg/dL  Glucose, capillary     Status: Abnormal   Collection Time:  10/05/20  8:07 PM  Result Value Ref Range   Glucose-Capillary 128 (H) 70 - 99 mg/dL  Glucose, capillary     Status: Abnormal   Collection Time: 10/05/20 11:26 PM  Result Value Ref Range   Glucose-Capillary 172 (H) 70 - 99 mg/dL  Glucose, capillary     Status: Abnormal   Collection Time: 10/06/20  3:39 AM  Result Value Ref Range   Glucose-Capillary 102 (H) 70 - 99 mg/dL    Assessment & Plan: Present on Admission: . Thoracic spine fracture (HCC)    LOS: 50 days   Fall from height  R PTX, B effusion Multiple B rib fxs- these are at the junction of the thoracic vertebral bodies, pain control 3 column T8 vertebral body fx-perDr. Jones,non-op now,  plan mobilize in brace. Needs CTO brace for HOB>30 degrees. Will discuss timing of lifting restrictions T7 neural arch fx- per nsgy R TVP T4-8 - pain control, per nsgy R C7 TVP fx through foramen- C collar per Dr. Yetta Barre, CTAnegative Large scalp laceration/right eyebrow laceration- repair by Dr. Lovick4/05/2020, sutures removed Right neck abrasion/skin tear- bacitracin BID Facial ecchymosis- CT maxillofacialdemonstrates no facial bone fractures. Some anterior subluxation of the right mandibular condyle HTN- home lopressor 25mg  daily CAD/H/O MI/DES- on home ASA Acute hypercarbic ventilator dependent respiratory failure/COPD-Trach/PEG 4/18by Dr. 03-05-1974. HTC 24h,Trach changed back to 6 cuffless 5/20 due to secretions -- wean FiO2 to goal of 30-35% on 8-10L.  Rash on back- benadryl Depression/anxiety- home bupropion and buspar BPH- home terazosin, repeatedly failedvoiding trials,  bethanachol. Home flomax as ordered now that he is taking PO. Cont I&O q6h ABL anemia- stable FEN-Dysphagia diet, Ensure Enlive between meals, prosource BID VTE-LMWH Dispo- ICU;HTC 24/7, goal FiO2 30-35% today, PT/OT   Critical Care Total Time: 36 Minutes   26, MD [facility] Surgery General, Hepatobiliary and Pancreatic Surgery 10/06/20 8:05 AM

## 2020-10-07 ENCOUNTER — Inpatient Hospital Stay (HOSPITAL_COMMUNITY): Payer: Medicaid Other

## 2020-10-07 LAB — GLUCOSE, CAPILLARY
Glucose-Capillary: 120 mg/dL — ABNORMAL HIGH (ref 70–99)
Glucose-Capillary: 125 mg/dL — ABNORMAL HIGH (ref 70–99)
Glucose-Capillary: 144 mg/dL — ABNORMAL HIGH (ref 70–99)
Glucose-Capillary: 147 mg/dL — ABNORMAL HIGH (ref 70–99)
Glucose-Capillary: 164 mg/dL — ABNORMAL HIGH (ref 70–99)
Glucose-Capillary: 171 mg/dL — ABNORMAL HIGH (ref 70–99)

## 2020-10-07 NOTE — Progress Notes (Signed)
Patient off the unit with transport for X-Ray of neck.

## 2020-10-07 NOTE — Progress Notes (Addendum)
Patient ID: Charles Weeks, male   DOB: 10-Dec-1965, 55 y.o.   MRN: [id number] Follow up - Trauma Critical Care  Patient Details:    Charles Weeks is an 55 y.o. male.  Lines/tubes : Gastrostomy/Enterostomy Percutaneous endoscopic gastrostomy (PEG) 20 Fr. LUQ (Active)  Surrounding Skin Dry;Intact 10/03/20 0744  Tube Status Patent 10/03/20 0744  Drainage Appearance None 10/03/20 0744  Dressing Status Clean;Dry;Intact 10/03/20 0744  Dressing Intervention Other (Comment) 10/03/20 0744  Dressing Type Abdominal Binder;Split gauze 10/03/20 0744  Dressing Change Due 10/04/20 10/03/20 0744  G Port Intake (mL) 100 ml 09/23/20 1600  Output (mL) 0 mL 09/23/20 1000     External Urinary Catheter (Active)  Collection Container Standard drainage bag 10/03/20 0744  Suction (Verified suction is between 40-80 mmHg) N/A (Patient has condom catheter) 10/03/20 0744  Securement Method Securing device (Describe) 10/03/20 0744  Site Assessment Clean;Intact 10/03/20 0744  Intervention Equipment Changed 10/03/20 0536    Microbiology/Sepsis markers: Results for orders placed or performed during the hospital encounter of 08/17/20  Resp Panel by RT-PCR (Flu A&B, Covid) Nasopharyngeal Swab     Status: None   Collection Time: 08/17/20  3:40 PM   Specimen: Nasopharyngeal Swab; Nasopharyngeal(NP) swabs in vial transport medium  Result Value Ref Range Status   SARS Coronavirus 2 by RT PCR NEGATIVE NEGATIVE Final    Comment: (NOTE) SARS-CoV-2 target nucleic acids are NOT DETECTED.  The SARS-CoV-2 RNA is generally detectable in upper respiratory specimens during the acute phase of infection. The lowest concentration of SARS-CoV-2 viral copies this assay can detect is 138 copies/mL. A negative result does not preclude SARS-Cov-2 infection and should not be used as the sole basis for treatment or other patient management decisions. A negative result may occur with  improper specimen collection/handling,  submission of specimen other than nasopharyngeal swab, presence of viral mutation(s) within the areas targeted by this assay, and inadequate number of viral copies(<138 copies/mL). A negative result must be combined with clinical observations, patient history, and epidemiological information. The expected result is Negative.  Fact Sheet for Patients:  BloggerCourse.com  Fact Sheet for Healthcare Providers:  SeriousBroker.it  This test is no t yet approved or cleared by the [place] FDA and  has been authorized for detection and/or diagnosis of SARS-CoV-2 by FDA under an Emergency Use Authorization (EUA). This EUA will remain  in effect (meaning this test can be used) for the duration of the COVID-19 declaration under Section 564(b)(1) of the Act, 21 U.S.C.section 360bbb-3(b)(1), unless the authorization is terminated  or revoked sooner.       Influenza A by PCR NEGATIVE NEGATIVE Final   Influenza B by PCR NEGATIVE NEGATIVE Final    Comment: (NOTE) The Xpert Xpress SARS-CoV-2/FLU/RSV plus assay is intended as an aid in the diagnosis of influenza from Nasopharyngeal swab specimens and should not be used as a sole basis for treatment. Nasal washings and aspirates are unacceptable for Xpert Xpress SARS-CoV-2/FLU/RSV testing.  Fact Sheet for Patients: BloggerCourse.com  Fact Sheet for Healthcare Providers: SeriousBroker.it  This test is not yet approved or cleared by the [place] FDA and has been authorized for detection and/or diagnosis of SARS-CoV-2 by FDA under an Emergency Use Authorization (EUA). This EUA will remain in effect (meaning this test can be used) for the duration of the COVID-19 declaration under Section 564(b)(1) of the Act, 21 U.S.C. section 360bbb-3(b)(1), unless the authorization is terminated or revoked.  Performed at [facility], [address], [place],  [place] [address]   Surgical PCR screen     Status: None   Collection Time: 08/19/20  7:38 AM   Specimen: Nasal Mucosa; Nasal Swab  Result Value Ref Range Status   MRSA, PCR NEGATIVE NEGATIVE Final   Staphylococcus aureus NEGATIVE NEGATIVE Final    Comment: (NOTE) The Xpert SA Assay (FDA approved for NASAL specimens in patients 55 years of age and older), is one component of a comprehensive surveillance program. It is not intended to diagnose infection nor to guide or monitor treatment. Performed at [facility], [address] [place], [place] [address]   MRSA PCR Screening     Status: None   Collection Time: 09/19/20  7:40 AM   Specimen: Nasal Mucosa; Nasopharyngeal  Result Value Ref Range Status   MRSA by PCR NEGATIVE NEGATIVE Final    Comment:        The GeneXpert MRSA Assay (FDA approved for NASAL specimens only), is one component of a comprehensive MRSA colonization surveillance program. It is not intended to diagnose MRSA infection nor to guide or monitor treatment for MRSA infections. Performed at [facility], [address], [place], [place] [address]     Anti-infectives:  Anti-infectives (From admission, onward)   Start     Dose/Rate Route Frequency Ordered Stop   08/19/20 0845  cefTRIAXone (ROCEPHIN) 2 g in sodium chloride 0.9 % 100 mL IVPB  Status:  Discontinued        2 g 200 mL/hr over 30 Minutes Intravenous Every 24 hours 08/19/20 0753 08/25/20 1304   08/17/20 1515  ceFAZolin (ANCEF) IVPB 2g/100 mL premix        2 g 200 mL/hr over 30 Minutes Intravenous  Once 08/17/20 1500 08/17/20 1530      Best Practice/Protocols:  VTE Prophylaxis: Lovenox (prophylaxtic dose) .  Consults: Treatment Team:  Tia Alert, MD    Studies:    Events:  Subjective:    Overnight Issues:  Sitting up in chair eating breakfast. Down to 5 lpm and 28% FiO2 briefly this am. No back to 8lpm  and 35%  Objective:  Vital signs for last  24 hours: Temp:  [98.9 F (37.2 C)-99.7 F (37.6 C)] 98.9 F (37.2 C) (05/22 0800) Pulse Rate:  [74-94] 86 (05/22 0811) Resp:  [9-21] 13 (05/22 0811) BP: (90-146)/(73-98) 133/76 (05/22 0800) SpO2:  [88 %-97 %] 91 % (05/22 0811) FiO2 (%):  [28 %-40 %] 28 % (05/22 0811) Weight:  [80.5 kg] 80.5 kg (05/22 0400)  Hemodynamic parameters for last 24 hours:    Intake/Output from previous day: 05/21 0701 - 05/22 0700 In: 360 [P.O.:360] Out: 1750 [Urine:1750]  Intake/Output this shift: No intake/output data recorded.  Vent settings for last 24 hours: FiO2 (%):  [28 %-40 %] 28 %  Physical Exam:  General: sitting up in chair, no respiratory distress HEENT/Neck: trach in place, clean and dry, on trach collar at 10L/min Resp: normal work of breathing CVS: RRR GI: soft, NT, PEG Extremities: calves soft  Results for orders placed or performed during the hospital encounter of 08/17/20 (from the past 24 hour(s))  Glucose, capillary     Status: Abnormal   Collection Time: 10/06/20 12:29 PM  Result Value Ref Range   Glucose-Capillary 147 (H) 70 - 99 mg/dL   Comment 1 Notify RN    Comment 2 Document in Chart   Glucose, capillary     Status: Abnormal   Collection Time: 10/06/20  3:41 PM  Result Value Ref Range   Glucose-Capillary 153 (H) 70 - 99 mg/dL   Comment 1 Notify RN    Comment 2 Document in Chart   Glucose, capillary     Status: Abnormal   Collection Time: 10/06/20  7:30 PM  Result Value Ref Range   Glucose-Capillary 158 (H) 70 - 99 mg/dL  Glucose, capillary     Status: Abnormal   Collection Time: 10/06/20 11:14 PM  Result Value Ref Range   Glucose-Capillary 134 (H) 70 - 99 mg/dL  Glucose, capillary     Status: Abnormal   Collection Time: 10/07/20  3:30 AM  Result Value Ref Range   Glucose-Capillary 125 (H) 70 - 99 mg/dL  Glucose, capillary     Status: Abnormal   Collection Time: 10/07/20  8:09 AM  Result Value Ref Range   Glucose-Capillary 147 (H) 70 - 99 mg/dL     Assessment & Plan: Present on Admission: . Thoracic spine fracture (HCC)    LOS: 51 days   Fall from height  R PTX, B effusion Multiple B rib fxs- these are at the junction of the thoracic vertebral bodies, pain control 3 column T8 vertebral body fx-perDr. Jones,non-op now, plan mobilize in brace. Needs CTO brace for HOB>30 degrees. Will discuss timing of lifting restrictions T7 neural arch fx- per nsgy R TVP T4-8 - pain control, per nsgy R C7 TVP fx through foramen- C collar per Dr. Yetta Barre, CTAnegative. Get flex/ex views and then consider d/c Large scalp laceration/right eyebrow laceration- repair by Dr. Lovick4/05/2020, sutures removed Right neck abrasion/skin tear- bacitracin BID Facial ecchymosis- CT maxillofacialdemonstrates no facial bone fractures. Some anterior subluxation of the right mandibular condyle HTN- home lopressor 25mg  daily CAD/H/O MI/DES- on home ASA Acute hypercarbic ventilator dependent respiratory failure/COPD-Trach/PEG 4/18by Dr. 03-05-1974. HTC 24h,Trach changed back to 6 cuffless 5/20 due to secretions -- wean FiO2 to goal of 30-35% on 8-10L.  Rash on back- benadryl Depression/anxiety- home bupropion and buspar BPH- home terazosin, repeatedly failedvoiding trials,  bethanachol. Home flomax as ordered now that he is taking PO. Cont I&O q6h ABL anemia- stable FEN-Dysphagia diet, Ensure Enlive between meals, prosource BID VTE-LMWH  Dispo- possible CIR tomorrow.HTC 24/7, goal FiO2 30-35% today, PT/OT. Possible d/c c collar pending flexion/extension x-ray  Critical Care Total Time: 20 Minutes   10-10, [facility] 10/07/2020, 11:05 AM Please see Amion for pager number during day hours 7:00am-4:30pm

## 2020-10-08 ENCOUNTER — Inpatient Hospital Stay (HOSPITAL_COMMUNITY): Payer: Medicaid Other

## 2020-10-08 LAB — GLUCOSE, CAPILLARY
Glucose-Capillary: 121 mg/dL — ABNORMAL HIGH (ref 70–99)
Glucose-Capillary: 113 mg/dL — ABNORMAL HIGH (ref 70–99)
Glucose-Capillary: 167 mg/dL — ABNORMAL HIGH (ref 70–99)
Glucose-Capillary: 171 mg/dL — ABNORMAL HIGH (ref 70–99)
Glucose-Capillary: 133 mg/dL — ABNORMAL HIGH (ref 70–99)
Glucose-Capillary: 154 mg/dL — ABNORMAL HIGH (ref 70–99)

## 2020-10-08 NOTE — Progress Notes (Signed)
Physical Therapy Treatment Patient Details Name: Charles Weeks MRN: [id number] DOB: 07/17/1965 Today's Date: 10/08/2020    History of Present Illness 55 y.o. male who presented 4/1 after falling down about 11 stairs and sustained R PTX, anterior subluxation of R mandibular condyle, multiple bil ribs fxs at junction of thoracic vertebral bodies T5-8 on R and T7-8 on L, 3 column T8 vertebral body fx, T7 neural arch fx, R TVP T4-8 fxs, and R C7 TVP fx through foramen. R chest tube inserted 4/1. Pt found to be hypoxic and was intubated 4/2. No evidence of vascular injury to neck, facial fx, acute intracranial injury, lumbar spine fx, R elbow acute abnormality, or pelvic fx identified on imaging. S/p trach and PEG placement 4/18. S/p L chest tube placement 4/19. PMH: HTN, COPD, CAD, s/p MI with DES RCA in Jan 2020 just on ASA, depression/anxiety, and BPH.    PT Comments    The pt was able to demo great progress in regards to motivation and activity tolerance this session. He was able to maintain SpO2 goal with hallway ambulation while on 4L O2 with FiO2 of 30% on trach collar. The pt did require frequent standing rest breaks (every 15 ft) to recover, but was able to complete 35 ft x2 without seated rest. The pt was also able to demo progress with standing balance, completing sit-stand transfers without UE support, needing mostly minG but moments of minA to steady. Continue to recommend CIR level therapies at d/c to facilitate maximal return of mobility, strength, cardiovascular endurance, and general safety and independence with mobility.    Follow Up Recommendations  CIR     Equipment Recommendations  Wheelchair (measurements PT);Wheelchair cushion (measurements PT);Rolling walker with 5" wheels;3in1 (PT)    Recommendations for Other Services       Precautions / Restrictions Precautions Precautions: Cervical;Back Precaution Booklet Issued: Yes (comment) Precaution Comments: trach collar,  TLSO done with rolling if HOB >30 , c-collar at all times, abdominal binder to protect PEG Required Braces or Orthoses: Cervical Brace;Spinal Brace Cervical Brace: Hard collar;At all times Spinal Brace: Thoracolumbosacral orthotic;Applied in supine position Restrictions Weight Bearing Restrictions: No    Mobility  Bed Mobility Overal bed mobility: Needs Assistance Bed Mobility: Rolling;Sit to Sidelying Rolling: Supervision       Sit to sidelying: Supervision General bed mobility comments: pt able to bring BLE back into bed without assist.    Transfers Overall transfer level: Needs assistance Equipment used: None Transfers: Sit to/from Stand Sit to Stand: Min guard         General transfer comment: minG to rise from recliner without UE support. minG due to slight instability with initial stand. pt able to correct with increased time  Ambulation/Gait Ambulation/Gait assistance: Min assist;Mod assist Gait Distance (Feet): 35 Feet (x2) Assistive device: 1 person hand held assist;Rolling walker (2 wheeled) Gait Pattern/deviations: Decreased stride length;Narrow base of support;Step-through pattern Gait velocity: decreased Gait velocity interpretation: <1.31 ft/sec, indicative of household ambulator General Gait Details: pt initially trialed with minA through HAA, no overt LOB but significat lateral sway and short steps. improved stability with RW, minA to steady with VC for positioning, posture, and improved stride length    Modified Rankin (Stroke Patients Only) Modified Rankin (Stroke Patients Only) Pre-Morbid Rankin Score: No symptoms Modified Rankin: Moderately severe disability     Balance Overall balance assessment: Needs assistance Sitting-balance support: No upper extremity supported;Feet supported Sitting balance-Leahy Scale: Fair Sitting balance - Comments: can sit without UE support  Standing balance support: Single extremity supported;Bilateral upper  extremity supported Standing balance-Leahy Scale: Poor Standing balance comment: reliant on UE support and minA at this time                            Cognition Arousal/Alertness: Awake/alert Behavior During Therapy: WFL for tasks assessed/performed Overall Cognitive Status: Impaired/Different from baseline Area of Impairment: Awareness;Problem solving;Memory               Rancho Levels of Cognitive Functioning Rancho Mirant Scales of Cognitive Functioning: Automatic/appropriate     Memory: Decreased recall of precautions;Decreased short-term memory     Awareness: Emergent Problem Solving: Slow processing;Difficulty sequencing;Requires verbal cues General Comments: PMV donned during session, pt able to verbalize basic needs and verblaize self-monitoring of fatigue with activity. followed all commands this session      Exercises      General Comments General comments (skin integrity, edema, etc.): VSS on RA this session, RN present and states SpO2 goal is 88%, pt able to maintain 88-92% on RA with rest, 4L 30% FiO2 on trach collar with mobility      Pertinent Vitals/Pain Pain Assessment: Faces Faces Pain Scale: Hurts a little bit Pain Location: generalized grimacing (back) Pain Descriptors / Indicators: Discomfort;Grimacing;Guarding Pain Intervention(s): Limited activity within patient's tolerance;Monitored during session;Repositioned           PT Goals (current goals can now be found in the care plan section) Acute Rehab PT Goals Patient Stated Goal: to improve PT Goal Formulation: With patient Time For Goal Achievement: 10/16/20 Potential to Achieve Goals: Good Progress towards PT goals: Progressing toward goals    Frequency    Min 4X/week      PT Plan Current plan remains appropriate       AM-PAC PT "6 Clicks" Mobility   Outcome Measure  Help needed turning from your back to your side while in a flat bed without using bedrails?: A  Little Help needed moving from lying on your back to sitting on the side of a flat bed without using bedrails?: A Little Help needed moving to and from a bed to a chair (including a wheelchair)?: A Little Help needed standing up from a chair using your arms (e.g., wheelchair or bedside chair)?: A Little Help needed to walk in hospital room?: A Little Help needed climbing 3-5 steps with a railing? : Total 6 Click Score: 16    End of Session Equipment Utilized During Treatment: Gait belt;Back brace;Cervical collar;Oxygen Activity Tolerance: Patient tolerated treatment well Patient left: with call bell/phone within reach;in bed;with bed alarm set Nurse Communication: Mobility status PT Visit Diagnosis: Muscle weakness (generalized) (M62.81);Difficulty in walking, not elsewhere classified (R26.2);Unsteadiness on feet (R26.81);Other abnormalities of gait and mobility (R26.89)     Time: 1711-1736 PT Time Calculation (min) (ACUTE ONLY): 25 min  Charges:  $Gait Training: 23-37 mins                     Rolm Baptise, PT, DPT   Acute Rehabilitation Department Pager #: [phone]   Gaetana Michaelis 10/08/2020, 5:58 PM

## 2020-10-08 NOTE — Progress Notes (Signed)
After patient worked with PT he returned to bed. Patient was placed on RA and SPO2 91-88%. No complaints at this time will continue to monitor.

## 2020-10-08 NOTE — Progress Notes (Signed)
Patient ID: Charles Weeks, male   DOB: [birth year], 55 y.o.   MRN: [id number] Follow up - Trauma Critical Care  Patient Details:    Charles Weeks is an 55 y.o. male.  Lines/tubes : Gastrostomy/Enterostomy Percutaneous endoscopic gastrostomy (PEG) 20 Fr. LUQ (Active)  Surrounding Skin Dry;Intact 10/03/20 0744  Tube Status Patent 10/03/20 0744  Drainage Appearance None 10/03/20 0744  Dressing Status Clean;Dry;Intact 10/03/20 0744  Dressing Intervention Other (Comment) 10/03/20 0744  Dressing Type Abdominal Binder;Split gauze 10/03/20 0744  Dressing Change Due 10/04/20 10/03/20 0744  G Port Intake (mL) 100 ml 09/23/20 1600  Output (mL) 0 mL 09/23/20 1000     External Urinary Catheter (Active)  Collection Container Standard drainage bag 10/03/20 0744  Suction (Verified suction is between 40-80 mmHg) N/A (Patient has condom catheter) 10/03/20 0744  Securement Method Securing device (Describe) 10/03/20 0744  Site Assessment Clean;Intact 10/03/20 0744  Intervention Equipment Changed 10/03/20 0536    Microbiology/Sepsis markers: Results for orders placed or performed during the hospital encounter of 08/17/20  Resp Panel by RT-PCR (Flu A&B, Covid) Nasopharyngeal Swab     Status: None   Collection Time: 08/17/20  3:40 PM   Specimen: Nasopharyngeal Swab; Nasopharyngeal(NP) swabs in vial transport medium  Result Value Ref Range Status   SARS Coronavirus 2 by RT PCR NEGATIVE NEGATIVE Final    Comment: (NOTE) SARS-CoV-2 target nucleic acids are NOT DETECTED.  The SARS-CoV-2 RNA is generally detectable in upper respiratory specimens during the acute phase of infection. The lowest concentration of SARS-CoV-2 viral copies this assay can detect is 138 copies/mL. A negative result does not preclude SARS-Cov-2 infection and should not be used as the sole basis for treatment or other patient management decisions. A negative result may occur with  improper specimen collection/handling,  submission of specimen other than nasopharyngeal swab, presence of viral mutation(s) within the areas targeted by this assay, and inadequate number of viral copies(<138 copies/mL). A negative result must be combined with clinical observations, patient history, and epidemiological information. The expected result is Negative.  Fact Sheet for Patients:  BloggerCourse.com  Fact Sheet for Healthcare Providers:  SeriousBroker.it  This test is no t yet approved or cleared by the [place] FDA and  has been authorized for detection and/or diagnosis of SARS-CoV-2 by FDA under an Emergency Use Authorization (EUA). This EUA will remain  in effect (meaning this test can be used) for the duration of the COVID-19 declaration under Section 564(b)(1) of the Act, 21 U.S.C.section 360bbb-3(b)(1), unless the authorization is terminated  or revoked sooner.       Influenza A by PCR NEGATIVE NEGATIVE Final   Influenza B by PCR NEGATIVE NEGATIVE Final    Comment: (NOTE) The Xpert Xpress SARS-CoV-2/FLU/RSV plus assay is intended as an aid in the diagnosis of influenza from Nasopharyngeal swab specimens and should not be used as a sole basis for treatment. Nasal washings and aspirates are unacceptable for Xpert Xpress SARS-CoV-2/FLU/RSV testing.  Fact Sheet for Patients: BloggerCourse.com  Fact Sheet for Healthcare Providers: SeriousBroker.it  This test is not yet approved or cleared by the [place] FDA and has been authorized for detection and/or diagnosis of SARS-CoV-2 by FDA under an Emergency Use Authorization (EUA). This EUA will remain in effect (meaning this test can be used) for the duration of the COVID-19 declaration under Section 564(b)(1) of the Act, 21 U.S.C. section 360bbb-3(b)(1), unless the authorization is terminated or revoked.  Performed at [facility], [address] [place],  [place] [address]   Surgical PCR screen     Status: None   Collection Time: 08/19/20  7:38 AM   Specimen: Nasal Mucosa; Nasal Swab  Result Value Ref Range Status   MRSA, PCR NEGATIVE NEGATIVE Final   Staphylococcus aureus NEGATIVE NEGATIVE Final    Comment: (NOTE) The Xpert SA Assay (FDA approved for NASAL specimens in patients 16 years of age and older), is one component of a comprehensive surveillance program. It is not intended to diagnose infection nor to guide or monitor treatment. Performed at [facility], [address] [place], [place] [address]   MRSA PCR Screening     Status: None   Collection Time: 09/19/20  7:40 AM   Specimen: Nasal Mucosa; Nasopharyngeal  Result Value Ref Range Status   MRSA by PCR NEGATIVE NEGATIVE Final    Comment:        The GeneXpert MRSA Assay (FDA approved for NASAL specimens only), is one component of a comprehensive MRSA colonization surveillance program. It is not intended to diagnose MRSA infection nor to guide or monitor treatment for MRSA infections. Performed at [facility], [address] [place], [place] [address]     Anti-infectives:  Anti-infectives (From admission, onward)   Start     Dose/Rate Route Frequency Ordered Stop   08/19/20 0845  cefTRIAXone (ROCEPHIN) 2 g in sodium chloride 0.9 % 100 mL IVPB  Status:  Discontinued        2 g 200 mL/hr over 30 Minutes Intravenous Every 24 hours 08/19/20 0753 08/25/20 1304   08/17/20 1515  ceFAZolin (ANCEF) IVPB 2g/100 mL premix        2 g 200 mL/hr over 30 Minutes Intravenous  Once 08/17/20 1500 08/17/20 1530      Best Practice/Protocols:  VTE Prophylaxis: Lovenox (prophylaxtic dose) .  Consults: Treatment Team:  Tia Alert, MD    Studies:    Events:  Subjective:    Overnight Issues:  NAEO. Remains on 7-8 L O2, FiO2 35%.   Objective:  Vital signs for last 24 hours: Temp:  [98.2 F (36.8 C)-99.4 F (37.4 C)] 99.4 F (37.4  C) (05/23 0400) Pulse Rate:  [73-92] 87 (05/23 0827) Resp:  [11-19] 16 (05/23 0827) BP: (107-132)/(54-98) 107/91 (05/23 0827) SpO2:  [87 %-96 %] 92 % (05/23 0835) FiO2 (%):  [35 %] 35 % (05/23 0835)  Hemodynamic parameters for last 24 hours:    Intake/Output from previous day: 05/22 0701 - 05/23 0700 In: 850 [P.O.:800; NG/GT:50] Out: 1850 [Urine:1850]  Intake/Output this shift: No intake/output data recorded.  Vent settings for last 24 hours: FiO2 (%):  [35 %] 35 %  Physical Exam:  General: laying in bed, no acute distress HEENT/Neck: c-collar in place, trach in place, clean and dry, on trach collar at 8L/min Resp: normal work of breathing CVS: RRR GI: soft, NT, PEG Extremities: calves soft  Results for orders placed or performed during the hospital encounter of 08/17/20 (from the past 24 hour(s))  Glucose, capillary     Status: Abnormal   Collection Time: 10/07/20 12:56 PM  Result Value Ref Range   Glucose-Capillary 164 (H) 70 - 99 mg/dL  Glucose, capillary     Status: Abnormal   Collection Time: 10/07/20  4:00 PM  Result Value Ref Range   Glucose-Capillary 144 (H) 70 - 99 mg/dL  Glucose, capillary     Status: Abnormal   Collection Time: 10/07/20  7:50 PM  Result Value Ref Range  Glucose-Capillary 120 (H) 70 - 99 mg/dL  Glucose, capillary     Status: Abnormal   Collection Time: 10/07/20 11:33 PM  Result Value Ref Range   Glucose-Capillary 171 (H) 70 - 99 mg/dL  Glucose, capillary     Status: Abnormal   Collection Time: 10/08/20  3:45 AM  Result Value Ref Range   Glucose-Capillary 121 (H) 70 - 99 mg/dL  Glucose, capillary     Status: Abnormal   Collection Time: 10/08/20  8:26 AM  Result Value Ref Range   Glucose-Capillary 113 (H) 70 - 99 mg/dL    Assessment & Plan: Present on Admission: . Thoracic spine fracture (HCC)    LOS: 52 days   Fall from height  R PTX, B effusion Multiple B rib fxs- these are at the junction of the thoracic vertebral  bodies, pain control 3 column T8 vertebral body fx-perDr. Jones,non-op now, plan mobilize in brace. Needs CTO brace for HOB>30 degrees. Will discuss timing of lifting restrictions T7 neural arch fx- per nsgy R TVP T4-8 - pain control, per nsgy R C7 TVP fx through foramen- C collar per Dr. Yetta Barre, CTAnegative. Get flex/ex views and then consider d/c Large scalp laceration/right eyebrow laceration- repair by Dr. Lovick4/05/2020, sutures removed Right neck abrasion/skin tear- bacitracin BID Facial ecchymosis- CT maxillofacialdemonstrates no facial bone fractures. Some anterior subluxation of the right mandibular condyle HTN- home lopressor 25mg  daily CAD/H/O MI/DES- on home ASA Acute hypercarbic ventilator dependent respiratory failure/COPD-Trach/PEG 4/18by Dr. 03-05-1974. HTC 24h,Trach changed back to 6 cuffless 5/20 due to secretions -- continue to wean FiO2 as able, currently 30-35% on 7-8L. Rash on back- benadryl Depression/anxiety- home bupropion and buspar BPH- home terazosin, repeatedly failedvoiding trials,  bethanachol. Home flomax as ordered now that he is taking PO. Cont I&O q6h ABL anemia- stable FEN-Dysphagia diet, Ensure Enlive between meals, prosource BID VTE-LMWH  Dispo- medically stable for discharge to CIR.HTC 24/7, goal FiO2 30-35% - wean as able , PT/OT.  Will discuss flex ex and c-collar removal with NS, can likely d/c collar but cannot visualize C7 on the study from yesterday.  Critical Care Total Time: 20 Minutes   Janee Morn, [facility] 10/08/2020, 9:10 AM Please see Amion for pager number during day hours 7:00am-4:30pm

## 2020-10-08 NOTE — TOC Progression Note (Signed)
Transition of Care [facility]) - Progression Note    Patient Details  Name: Charles Weeks MRN: [id number] Date of Birth: 20-Jun-1965  Transition of Care [facility]) CM/SW Contact  Mearl Latin, LCSW Phone Number: 10/08/2020, 11:44 AM  Clinical Narrative:    CSW touched base with CIR [facility]) regarding bed availability; no beds today but they will contact patient's sister to determine support.    Expected Discharge Plan: IP Rehab Facility Barriers to Discharge: Insurance Authorization (no cir bed)  Expected Discharge Plan and Services Expected Discharge Plan: IP Rehab Facility In-house Referral: Clinical Social Work Discharge Planning Services: CM Consult Post Acute Care Choice: Skilled Nursing Facility Living arrangements for the past 2 months: Single Family Home                                       Social Determinants of Health (SDOH) Interventions    Readmission Risk Interventions No flowsheet data found.

## 2020-10-08 NOTE — Progress Notes (Addendum)
Inpatient Rehab Admissions Coordinator:   Was able to speak to pt's sister over the phone regarding CIR goals/expectations. We discussed average length of stay to be about 2 weeks, but dependent on pt progress could be shorter or longer.  Goals for Mr. Wirt would be supervision to mod I. I also let her know that I would need to seek insurance authorization now that his O2 requirements are at a point we can manage on CIR and I've started that process.  I will follow for possible admit this week pending insurance auth and bed availability.    Estill Dooms, PT, DPT Admissions Coordinator [phone] 10/08/20  12:32 PM

## 2020-10-09 ENCOUNTER — Inpatient Hospital Stay (HOSPITAL_COMMUNITY)
Admission: RE | Admit: 2020-10-09 | Discharge: 2020-10-31 | DRG: 560 | Disposition: A | Discharge status: Home Health | Payer: Medicaid Other | Source: Intra-hospital | Attending: Physical Medicine & Rehabilitation | Admitting: Physical Medicine & Rehabilitation

## 2020-10-09 ENCOUNTER — Other Ambulatory Visit: Payer: Self-pay

## 2020-10-09 ENCOUNTER — Inpatient Hospital Stay (HOSPITAL_COMMUNITY): Payer: Medicaid Other

## 2020-10-09 ENCOUNTER — Encounter: Payer: Self-pay | Admitting: Cardiology

## 2020-10-09 ENCOUNTER — Encounter (HOSPITAL_COMMUNITY): Payer: Self-pay | Admitting: Physical Medicine & Rehabilitation

## 2020-10-09 DIAGNOSIS — I252 Old myocardial infarction: Secondary | ICD-10-CM

## 2020-10-09 DIAGNOSIS — F329 Major depressive disorder, single episode, unspecified: Secondary | ICD-10-CM

## 2020-10-09 DIAGNOSIS — F17210 Nicotine dependence, cigarettes, uncomplicated: Secondary | ICD-10-CM | POA: Diagnosis present

## 2020-10-09 DIAGNOSIS — R1310 Dysphagia, unspecified: Secondary | ICD-10-CM | POA: Diagnosis present

## 2020-10-09 DIAGNOSIS — R338 Other retention of urine: Secondary | ICD-10-CM | POA: Diagnosis present

## 2020-10-09 DIAGNOSIS — S22008S Other fracture of unspecified thoracic vertebra, sequela: Secondary | ICD-10-CM | POA: Diagnosis present

## 2020-10-09 DIAGNOSIS — S22049D Unspecified fracture of fourth thoracic vertebra, subsequent encounter for fracture with routine healing: Secondary | ICD-10-CM

## 2020-10-09 DIAGNOSIS — Z8249 Family history of ischemic heart disease and other diseases of the circulatory system: Secondary | ICD-10-CM | POA: Diagnosis not present

## 2020-10-09 DIAGNOSIS — F419 Anxiety disorder, unspecified: Secondary | ICD-10-CM | POA: Diagnosis present

## 2020-10-09 DIAGNOSIS — J386 Stenosis of larynx: Secondary | ICD-10-CM | POA: Diagnosis not present

## 2020-10-09 DIAGNOSIS — F418 Other specified anxiety disorders: Secondary | ICD-10-CM | POA: Diagnosis not present

## 2020-10-09 DIAGNOSIS — R1312 Dysphagia, oropharyngeal phase: Secondary | ICD-10-CM | POA: Diagnosis not present

## 2020-10-09 DIAGNOSIS — R490 Dysphonia: Secondary | ICD-10-CM | POA: Diagnosis not present

## 2020-10-09 DIAGNOSIS — I2510 Atherosclerotic heart disease of native coronary artery without angina pectoris: Secondary | ICD-10-CM | POA: Diagnosis present

## 2020-10-09 DIAGNOSIS — R5381 Other malaise: Secondary | ICD-10-CM | POA: Diagnosis not present

## 2020-10-09 DIAGNOSIS — J9601 Acute respiratory failure with hypoxia: Secondary | ICD-10-CM | POA: Diagnosis not present

## 2020-10-09 DIAGNOSIS — Z79899 Other long term (current) drug therapy: Secondary | ICD-10-CM | POA: Diagnosis not present

## 2020-10-09 DIAGNOSIS — K5900 Constipation, unspecified: Secondary | ICD-10-CM | POA: Diagnosis not present

## 2020-10-09 DIAGNOSIS — Z7982 Long term (current) use of aspirin: Secondary | ICD-10-CM

## 2020-10-09 DIAGNOSIS — F32A Depression, unspecified: Secondary | ICD-10-CM | POA: Diagnosis present

## 2020-10-09 DIAGNOSIS — D62 Acute posthemorrhagic anemia: Secondary | ICD-10-CM | POA: Diagnosis present

## 2020-10-09 DIAGNOSIS — M7989 Other specified soft tissue disorders: Secondary | ICD-10-CM | POA: Diagnosis not present

## 2020-10-09 DIAGNOSIS — R0602 Shortness of breath: Secondary | ICD-10-CM

## 2020-10-09 DIAGNOSIS — I10 Essential (primary) hypertension: Secondary | ICD-10-CM | POA: Diagnosis present

## 2020-10-09 DIAGNOSIS — S22000G Wedge compression fracture of unspecified thoracic vertebra, subsequent encounter for fracture with delayed healing: Secondary | ICD-10-CM | POA: Diagnosis not present

## 2020-10-09 DIAGNOSIS — J439 Emphysema, unspecified: Secondary | ICD-10-CM | POA: Diagnosis present

## 2020-10-09 DIAGNOSIS — Z7951 Long term (current) use of inhaled steroids: Secondary | ICD-10-CM | POA: Diagnosis not present

## 2020-10-09 DIAGNOSIS — S2243XD Multiple fractures of ribs, bilateral, subsequent encounter for fracture with routine healing: Secondary | ICD-10-CM | POA: Diagnosis not present

## 2020-10-09 DIAGNOSIS — Z931 Gastrostomy status: Secondary | ICD-10-CM

## 2020-10-09 DIAGNOSIS — S22069D Unspecified fracture of T7-T8 vertebra, subsequent encounter for fracture with routine healing: Secondary | ICD-10-CM | POA: Diagnosis present

## 2020-10-09 DIAGNOSIS — Z930 Tracheostomy status: Secondary | ICD-10-CM | POA: Diagnosis not present

## 2020-10-09 DIAGNOSIS — S22000A Wedge compression fracture of unspecified thoracic vertebra, initial encounter for closed fracture: Secondary | ICD-10-CM

## 2020-10-09 DIAGNOSIS — N401 Enlarged prostate with lower urinary tract symptoms: Secondary | ICD-10-CM | POA: Diagnosis present

## 2020-10-09 DIAGNOSIS — J40 Bronchitis, not specified as acute or chronic: Secondary | ICD-10-CM | POA: Diagnosis not present

## 2020-10-09 DIAGNOSIS — R059 Cough, unspecified: Secondary | ICD-10-CM

## 2020-10-09 DIAGNOSIS — J449 Chronic obstructive pulmonary disease, unspecified: Secondary | ICD-10-CM

## 2020-10-09 DIAGNOSIS — Z833 Family history of diabetes mellitus: Secondary | ICD-10-CM

## 2020-10-09 DIAGNOSIS — W109XXD Fall (on) (from) unspecified stairs and steps, subsequent encounter: Secondary | ICD-10-CM | POA: Diagnosis present

## 2020-10-09 DIAGNOSIS — S22009A Unspecified fracture of unspecified thoracic vertebra, initial encounter for closed fracture: Principal | ICD-10-CM | POA: Diagnosis present

## 2020-10-09 LAB — CBC
HCT: 37.9 % — ABNORMAL LOW (ref 39.0–52.0)
Hemoglobin: 12 g/dL — ABNORMAL LOW (ref 13.0–17.0)
MCH: 28.4 pg (ref 26.0–34.0)
MCHC: 31.7 g/dL (ref 30.0–36.0)
MCV: 89.8 fL (ref 80.0–100.0)
Platelets: 320 10*3/uL (ref 150–400)
RBC: 4.22 MIL/uL (ref 4.22–5.81)
RDW: 17 % — ABNORMAL HIGH (ref 11.5–15.5)
WBC: 10.5 10*3/uL (ref 4.0–10.5)
nRBC: 0 % (ref 0.0–0.2)

## 2020-10-09 LAB — GLUCOSE, CAPILLARY
Glucose-Capillary: 135 mg/dL — ABNORMAL HIGH (ref 70–99)
Glucose-Capillary: 124 mg/dL — ABNORMAL HIGH (ref 70–99)
Glucose-Capillary: 160 mg/dL — ABNORMAL HIGH (ref 70–99)
Glucose-Capillary: 132 mg/dL — ABNORMAL HIGH (ref 70–99)
Glucose-Capillary: 153 mg/dL — ABNORMAL HIGH (ref 70–99)

## 2020-10-09 LAB — CREATININE, SERUM
Creatinine, Ser: 0.8 mg/dL (ref 0.61–1.24)
GFR, Estimated: >60 mL/min (ref >60)

## 2020-10-09 MED ORDER — ENOXAPARIN SODIUM 40 MG/0.4ML IJ SOSY
40.0000 mg | PREFILLED_SYRINGE | INTRAMUSCULAR | Status: DC
  Administered 2020-10-10 – 2020-10-31 (×22): 40 mg via SUBCUTANEOUS
  Filled 2020-10-09 (×23): qty 0.4

## 2020-10-09 MED ORDER — MONTELUKAST SODIUM 10 MG PO TABS
10.0000 mg | ORAL_TABLET | Freq: Every day | ORAL | Status: DC
  Administered 2020-10-09 – 2020-10-17 (×9): 10 mg
  Filled 2020-10-09 (×9): qty 1

## 2020-10-09 MED ORDER — BUPROPION HCL ER (XL) 300 MG PO TB24
300.0000 mg | ORAL_TABLET | Freq: Every day | ORAL | Status: DC
  Administered 2020-10-10 – 2020-10-31 (×22): 300 mg via ORAL
  Filled 2020-10-09 (×23): qty 1

## 2020-10-09 MED ORDER — ACETAMINOPHEN 325 MG PO TABS: 650.0000 mg | ORAL_TABLET | Freq: Four times a day (QID) | ORAL | Status: DC | PRN

## 2020-10-09 MED ORDER — BETHANECHOL CHLORIDE 25 MG PO TABS
50.0000 mg | ORAL_TABLET | Freq: Three times a day (TID) | ORAL | Status: DC
  Administered 2020-10-09 – 2020-10-18 (×27): 50 mg
  Filled 2020-10-09 (×27): qty 2

## 2020-10-09 MED ORDER — ONDANSETRON 4 MG PO TBDP
4.0000 mg | ORAL_TABLET | Freq: Four times a day (QID) | ORAL | Status: DC | PRN
  Filled 2020-10-09: qty 1

## 2020-10-09 MED ORDER — PROSOURCE TF PO LIQD
45.0000 mL | Freq: Two times a day (BID) | ORAL | Status: DC
  Administered 2020-10-09 – 2020-10-13 (×9): 45 mL
  Filled 2020-10-09 (×9): qty 45

## 2020-10-09 MED ORDER — THIAMINE HCL 100 MG PO TABS
100.0000 mg | ORAL_TABLET | Freq: Every day | ORAL | Status: DC
  Administered 2020-10-10 – 2020-10-30 (×21): 100 mg
  Filled 2020-10-09 (×22): qty 1

## 2020-10-09 MED ORDER — ACETAMINOPHEN 160 MG/5ML PO SOLN: 650.0000 mg | Freq: Four times a day (QID) | ORAL | Status: DC | PRN

## 2020-10-09 MED ORDER — ACETAMINOPHEN 650 MG RE SUPP: 650.0000 mg | Freq: Four times a day (QID) | RECTAL | Status: DC | PRN

## 2020-10-09 MED ORDER — QUETIAPINE FUMARATE 50 MG PO TABS
150.0000 mg | ORAL_TABLET | Freq: Two times a day (BID) | ORAL | Status: DC
  Administered 2020-10-10 – 2020-10-18 (×17): 150 mg
  Filled 2020-10-09 (×15): qty 3
  Filled 2020-10-09: qty 6
  Filled 2020-10-09 (×2): qty 3

## 2020-10-09 MED ORDER — GUAIFENESIN-DM 100-10 MG/5ML PO SYRP
15.0000 mL | ORAL_SOLUTION | ORAL | Status: DC
  Administered 2020-10-09 – 2020-10-30 (×112): 15 mL
  Filled 2020-10-09 (×117): qty 15

## 2020-10-09 MED ORDER — POLYETHYLENE GLYCOL 3350 17 G PO PACK
17.0000 g | PACK | Freq: Every day | ORAL | Status: DC
  Administered 2020-10-10 – 2020-10-31 (×19): 17 g via ORAL
  Filled 2020-10-09 (×23): qty 1

## 2020-10-09 MED ORDER — TERAZOSIN HCL 1 MG PO CAPS
2.0000 mg | ORAL_CAPSULE | Freq: Every day | ORAL | Status: DC
  Administered 2020-10-10 – 2020-10-30 (×21): 2 mg
  Filled 2020-10-09 (×21): qty 2

## 2020-10-09 MED ORDER — CLONAZEPAM 0.5 MG PO TABS
0.5000 mg | ORAL_TABLET | Freq: Every day | ORAL | Status: DC
  Administered 2020-10-09 – 2020-10-17 (×9): 0.5 mg
  Filled 2020-10-09 (×9): qty 1

## 2020-10-09 MED ORDER — DOCUSATE SODIUM 50 MG/5ML PO LIQD
100.0000 mg | Freq: Every day | ORAL | Status: DC
  Administered 2020-10-10 – 2020-10-30 (×21): 100 mg
  Filled 2020-10-09 (×23): qty 10

## 2020-10-09 MED ORDER — TAMSULOSIN HCL 0.4 MG PO CAPS
0.4000 mg | ORAL_CAPSULE | Freq: Every day | ORAL | Status: DC
  Administered 2020-10-10 – 2020-10-19 (×10): 0.4 mg via ORAL
  Filled 2020-10-09 (×10): qty 1

## 2020-10-09 MED ORDER — ONDANSETRON HCL 4 MG/2ML IJ SOLN
4.0000 mg | Freq: Four times a day (QID) | INTRAMUSCULAR | Status: DC | PRN
  Filled 2020-10-09: qty 2

## 2020-10-09 MED ORDER — ARTIFICIAL TEARS OPHTHALMIC OINT: 1.0000 "application " | TOPICAL_OINTMENT | OPHTHALMIC | Status: DC | PRN

## 2020-10-09 MED ORDER — INSULIN ASPART 100 UNIT/ML ~~LOC~~ SOLN
0.0000 [IU] | SUBCUTANEOUS | Status: DC
  Administered 2020-10-09 – 2020-10-10 (×2): 3 [IU] via SUBCUTANEOUS
  Administered 2020-10-10 – 2020-10-11 (×5): 2 [IU] via SUBCUTANEOUS
  Administered 2020-10-11: 3 [IU] via SUBCUTANEOUS
  Administered 2020-10-11: 2 [IU] via SUBCUTANEOUS
  Administered 2020-10-11 (×2): 3 [IU] via SUBCUTANEOUS
  Administered 2020-10-12 (×2): 2 [IU] via SUBCUTANEOUS
  Administered 2020-10-12: 3 [IU] via SUBCUTANEOUS
  Administered 2020-10-12: 5 [IU] via SUBCUTANEOUS
  Administered 2020-10-12 – 2020-10-13 (×2): 3 [IU] via SUBCUTANEOUS
  Administered 2020-10-13: 2 [IU] via SUBCUTANEOUS
  Administered 2020-10-13 (×2): 3 [IU] via SUBCUTANEOUS
  Administered 2020-10-13 (×2): 2 [IU] via SUBCUTANEOUS
  Administered 2020-10-14 (×3): 3 [IU] via SUBCUTANEOUS

## 2020-10-09 MED ORDER — FOLIC ACID 1 MG PO TABS
1.0000 mg | ORAL_TABLET | Freq: Every day | ORAL | Status: DC
  Administered 2020-10-10 – 2020-10-18 (×9): 1 mg
  Filled 2020-10-09 (×10): qty 1

## 2020-10-09 MED ORDER — SCOPOLAMINE 1 MG/3DAYS TD PT72
1.0000 | MEDICATED_PATCH | TRANSDERMAL | Status: DC
  Administered 2020-10-10 – 2020-10-31 (×8): 1.5 mg via TRANSDERMAL
  Filled 2020-10-09 (×9): qty 1

## 2020-10-09 MED ORDER — OXYCODONE HCL 5 MG PO TABS
10.0000 mg | ORAL_TABLET | ORAL | Status: DC | PRN
  Administered 2020-10-09 – 2020-10-17 (×13): 10 mg
  Filled 2020-10-09 (×15): qty 2

## 2020-10-09 MED ORDER — METOPROLOL TARTRATE 25 MG PO TABS
25.0000 mg | ORAL_TABLET | Freq: Every day | ORAL | Status: DC
  Administered 2020-10-10 – 2020-10-18 (×9): 25 mg
  Filled 2020-10-09 (×10): qty 1

## 2020-10-09 MED ORDER — ENSURE ENLIVE PO LIQD
237.0000 mL | Freq: Three times a day (TID) | ORAL | Status: DC
  Administered 2020-10-10 – 2020-10-29 (×50): 237 mL via ORAL
  Filled 2020-10-09 (×4): qty 237

## 2020-10-09 MED ORDER — QUETIAPINE FUMARATE 50 MG PO TABS
300.0000 mg | ORAL_TABLET | Freq: Every day | ORAL | Status: DC
  Administered 2020-10-09 – 2020-10-17 (×9): 300 mg
  Filled 2020-10-09: qty 1
  Filled 2020-10-09: qty 2
  Filled 2020-10-09: qty 1
  Filled 2020-10-09 (×2): qty 2
  Filled 2020-10-09 (×2): qty 1
  Filled 2020-10-09 (×2): qty 2

## 2020-10-09 MED ORDER — ASPIRIN 81 MG PO CHEW
81.0000 mg | CHEWABLE_TABLET | Freq: Every day | ORAL | Status: DC
  Administered 2020-10-10 – 2020-10-30 (×21): 81 mg
  Filled 2020-10-09 (×22): qty 1

## 2020-10-09 MED ORDER — OXYCODONE HCL 5 MG PO TABS
5.0000 mg | ORAL_TABLET | ORAL | Status: DC | PRN
  Administered 2020-10-10 – 2020-10-18 (×11): 5 mg
  Filled 2020-10-09 (×11): qty 1

## 2020-10-09 MED ORDER — PANTOPRAZOLE SODIUM 40 MG PO PACK
40.0000 mg | PACK | Freq: Every day | ORAL | Status: DC
  Administered 2020-10-10 – 2020-10-30 (×21): 40 mg
  Filled 2020-10-09 (×16): qty 20

## 2020-10-09 MED ORDER — IPRATROPIUM-ALBUTEROL 0.5-2.5 (3) MG/3ML IN SOLN
3.0000 mL | Freq: Two times a day (BID) | RESPIRATORY_TRACT | Status: DC
  Administered 2020-10-09 – 2020-10-27 (×36): 3 mL via RESPIRATORY_TRACT
  Filled 2020-10-09 (×37): qty 3

## 2020-10-09 MED ORDER — BUSPIRONE HCL 5 MG PO TABS
10.0000 mg | ORAL_TABLET | Freq: Three times a day (TID) | ORAL | Status: DC
  Administered 2020-10-09 – 2020-10-18 (×27): 10 mg
  Filled 2020-10-09 (×27): qty 2

## 2020-10-09 MED ORDER — MOMETASONE FURO-FORMOTEROL FUM 200-5 MCG/ACT IN AERO
2.0000 | INHALATION_SPRAY | Freq: Two times a day (BID) | RESPIRATORY_TRACT | Status: DC
  Administered 2020-10-09 – 2020-10-31 (×40): 2 via RESPIRATORY_TRACT
  Filled 2020-10-09: qty 8.8

## 2020-10-09 MED ORDER — ENOXAPARIN SODIUM 40 MG/0.4ML IJ SOSY
40.0000 mg | PREFILLED_SYRINGE | Freq: Two times a day (BID) | INTRAMUSCULAR | Status: DC
Start: 2020-10-10 — End: 2020-10-09

## 2020-10-09 MED ORDER — ALBUTEROL SULFATE (2.5 MG/3ML) 0.083% IN NEBU
2.5000 mg | INHALATION_SOLUTION | Freq: Four times a day (QID) | RESPIRATORY_TRACT | Status: DC | PRN
  Administered 2020-10-11 – 2020-10-28 (×3): 2.5 mg via RESPIRATORY_TRACT
  Filled 2020-10-09 (×3): qty 3

## 2020-10-09 MED ORDER — ADULT MULTIVITAMIN W/MINERALS CH
1.0000 | ORAL_TABLET | Freq: Every day | ORAL | Status: DC
  Administered 2020-10-10 – 2020-10-30 (×21): 1
  Filled 2020-10-09 (×21): qty 1

## 2020-10-09 NOTE — Progress Notes (Signed)
Physical Therapy Treatment Patient Details Name: Charles Weeks MRN: [id number] DOB: Sep 28, 1965 Today's Date: 10/09/2020    History of Present Illness 55 y.o. male who presented 4/1 after falling down about 11 stairs and sustained R PTX, anterior subluxation of R mandibular condyle, multiple bil ribs fxs at junction of thoracic vertebral bodies T5-8 on R and T7-8 on L, 3 column T8 vertebral body fx, T7 neural arch fx, R TVP T4-8 fxs, and R C7 TVP fx through foramen. R chest tube inserted 4/1. Pt found to be hypoxic and was intubated 4/2. No evidence of vascular injury to neck, facial fx, acute intracranial injury, lumbar spine fx, R elbow acute abnormality, or pelvic fx identified on imaging. S/p trach and PEG placement 4/18. S/p L chest tube placement 4/19. PMH: HTN, COPD, CAD, s/p MI with DES RCA in Jan 2020 just on ASA, depression/anxiety, and BPH.    PT Comments    Pt making excellent progress towards his physical therapy goals, demonstrating improved activity tolerance and ambulation distance. Pt requiring minA for functional mobility; ambulating x 100 feet with a walker. Desaturation to 83% on 4L O2 via 30% TC. Rebounded to 92% with seated rest break. Continues to present as a high fall risk based on decreased gait speed and balance deficits. Continue to recommend comprehensive inpatient rehab (CIR) for post-acute therapy needs.    Follow Up Recommendations  CIR     Equipment Recommendations  Wheelchair (measurements PT);Wheelchair cushion (measurements PT);Rolling walker with 5" wheels;3in1 (PT)    Recommendations for Other Services       Precautions / Restrictions Precautions Precautions: Cervical;Back Precaution Booklet Issued: Yes (comment) Precaution Comments: trach collar, TLSO done with rolling if HOB >30,  abdominal binder to protect PEG, c-collar d/c'ed 5/24 Required Braces or Orthoses: Spinal Brace Spinal Brace: Thoracolumbosacral orthotic;Applied in supine  position Restrictions Weight Bearing Restrictions: No    Mobility  Bed Mobility Overal bed mobility: Needs Assistance Bed Mobility: Rolling;Sit to Sidelying Rolling: Supervision Sidelying to sit: Min assist       General bed mobility comments: Use of rail, HOB elevated, minA to execute bringing trunk to upright position    Transfers Overall transfer level: Needs assistance Equipment used: Rolling walker (2 wheeled) Transfers: Sit to/from Stand Sit to Stand: Min assist         General transfer comment: Light minA to steady upon ascent  Ambulation/Gait Ambulation/Gait assistance: Min assist Gait Distance (Feet): 100 Feet Assistive device: Rolling walker (2 wheeled) Gait Pattern/deviations: Decreased stride length;Narrow base of support;Step-through pattern Gait velocity: decreased   General Gait Details: Cues for walker proximity and activity pacing, requiring up to minA due to dynamic instability during turns   Optometrist    Modified Rankin (Stroke Patients Only) Modified Rankin (Stroke Patients Only) Pre-Morbid Rankin Score: No symptoms Modified Rankin: Moderately severe disability     Balance Overall balance assessment: Needs assistance Sitting-balance support: No upper extremity supported;Feet supported Sitting balance-Leahy Scale: Fair Sitting balance - Comments: can sit without UE support   Standing balance support: Single extremity supported;Bilateral upper extremity supported Standing balance-Leahy Scale: Poor Standing balance comment: reliant on UE support and minA at this time                            Cognition Arousal/Alertness: Awake/alert Behavior During Therapy: WFL for tasks assessed/performed Overall Cognitive Status: Impaired/Different from baseline Area  of Impairment: Awareness;Problem solving;Memory               Rancho Levels of Cognitive Functioning Ranch[place] Mirant Scales of  Cognitive Functioning: Automatic/appropriate     Memory: Decreased recall of precautions;Decreased short-term memory     Awareness: Emergent Problem Solving: Slow processing;Difficulty sequencing;Requires verbal cues General Comments: Needs cues for monitoring fatigue, very motivated      Exercises      General Comments        Pertinent Vitals/Pain Pain Assessment: Faces Faces Pain Scale: Hurts a little bit Pain Location: back, R shoulder Pain Descriptors / Indicators: Discomfort;Grimacing;Guarding Pain Intervention(s): Monitored during session    Home Living                      Prior Function            PT Goals (current goals can now be found in the care plan section) Acute Rehab PT Goals Patient Stated Goal: to improve speech, walk more PT Goal Formulation: With patient Time For Goal Achievement: 10/16/20 Potential to Achieve Goals: Good Progress towards PT goals: Progressing toward goals    Frequency    Min 4X/week      PT Plan Current plan remains appropriate    Co-evaluation              AM-PAC PT "6 Clicks" Mobility   Outcome Measure  Help needed turning from your back to your side while in a flat bed without using bedrails?: A Little Help needed moving from lying on your back to sitting on the side of a flat bed without using bedrails?: A Little Help needed moving to and from a bed to a chair (including a wheelchair)?: A Little Help needed standing up from a chair using your arms (e.g., wheelchair or bedside chair)?: A Little Help needed to walk in hospital room?: A Little Help needed climbing 3-5 steps with a railing? : A Lot 6 Click Score: 17    End of Session Equipment Utilized During Treatment: Gait belt;Back brace;Oxygen Activity Tolerance: Patient tolerated treatment well Patient left: with call bell/phone within reach;in chair;with chair alarm set Nurse Communication: Mobility status PT Visit Diagnosis: Muscle weakness  (generalized) (M62.81);Difficulty in walking, not elsewhere classified (R26.2);Unsteadiness on feet (R26.81);Other abnormalities of gait and mobility (R26.89)     Time: 6789-3810 PT Time Calculation (min) (ACUTE ONLY): 21 min  Charges:  $Gait Training: 8-22 mins                     Lillia Pauls, PT, DPT Acute Rehabilitation Services Pager [phone] Office [phone]    Norval Morton 10/09/2020, 10:48 AM

## 2020-10-09 NOTE — Discharge Summary (Signed)
Physician Discharge Summary  Patient ID: Charles Weeks MRN: [id number] DOB/AGE: 1965-08-24 55 y.o.  Admit date: 08/17/2020 Discharge date: 10/09/2020  Discharge Diagnoses Fall from height Bilateral rib fractures Right pneumothorax, resolved 3 column T8 vertebral body fracture T7 neural arch fracture Right transverse process fractures T4-T8 Right C7 transverse process fracture through foramen Large scalp laceration and right eyebrow laceration Right neck abrasion  Facial ecchymosis  HTN CAD Acute on chronic hypercarbic respiratory failure s/p tracheostomy  Depression/anxiety BPH ABL anemia, stable   Consultants Neurosurgery  Critical care  Palliative care   Procedures Chest tube insertion - (08/17/20) Barnetta Chapel PA-C Laceration repair x2 - (08/17/20) Kris Mouton, MD Tracheostomy - (09/03/20) Violeta Gelinas, MD PEG - (09/03/20) Violeta Gelinas, MD Left chest tube insertion - (09/04/20)  HPI: This is a 55 year old male with a history of COPD, CAD, s/p MI with DES placement in RCA in Jan 2020 just on ASA, HTN, Depression/Anxiety, BPH who is followed by Dr. Sherene Sires for pulm and Dr. Diona Browner with cards who fell down about 11 stairs.  He stated he drank 4-5 beers today but his ETOH is only 19 on arrival and he does not drink routinely. He did not have syncope, but he really doesn't know what he fell. He was found by his sister and EMS was called. He was brought in as a level II and ultimately upgraded to a level I secondary to hypoxia in the 70s. He responded to a non-rebreather. Upon our arrival, he was noted to be normotensive with a large scalp laceration and complaining of horrible mid back pain. He was completely neuro intact moving all 4 with normal sensation and a GCS of 15.  He was taken to the scanner with injuries noted above. Right chest tube placed for PTX as listed above and lacerations were repaired as listed above.  Hospital Course: Patient was admitted to the trauma ICU.  Neurosurgery consulted for multiple spinal fractures and recommended operative fixation for thoracic fractures and collar for cervical fracture until flexion-extension films could be obtained. Surgery was originally planned for 4/2 but postponed secondary to severe respiratory distress requiring intubation 4/2. Patient continued to remain on the vent and required pressors and it was determined that patient would be better served by mobilizing in brace once stable enough to tolerate this and only proceed with stabilization surgery if the fracture progressed with bracing and mobilization. Patient had weaned off pressors 4/5. Patient transfused 1 unit PRBC 4/6 for ABL anemia with appropriate response in hgb. Patient failed TOV and foley was replaced 4/7. Palliative consulted 4/8 to help establish GOC and patient's family determined to be interested in proceeding with tracheostomy and PEG placement. Sutures removed from laceration repair sites 4/11. TOV attempted again 4/14, foley replaced 4/15. TOV attempted again 4/17, foley was replaced 4/18. Patient underwent tracheostomy and placed of PEG tube 4/18 as listed above. Patient noted to have worsening left sided pleural effusion 4/19, left chest tube placed as listed above. Patient started on I&O cath schedule 4/20. Left chest tube removed 4/26. Right chest tube removed 5/1. Patient struggled with weaning off vent even s/p tracheostomy but starting working towards [facility] trials 5/2. Tracheostomy changed 5/6 for blown cuff. Patient was tolerating HTC as of 5/12. Trach downsized to 4 cuffless 5/19. TF were stopped 5/19 as patient was consuming > 75% of meals. Patient complained of right shoulder pain 5/19, films negative for acute fracture. Trach changed back to 6 cuffless 5/20 for increased secretions. Flexion-extension films  of neck done 5/23, neurosurgery cleared for removal of collar 5/24. Patient discharged to inpatient rehab on 5/24 per therapy recommendations.    Allergies as of 10/09/2020   No Known Allergies     Medication List    ASK your doctor about these medications   ProAir HFA 108 (90 Base) MCG/ACT inhaler Generic drug: albuterol Inhale 1-2 puffs into the lungs every 4 (four) hours as needed for wheezing or shortness of breath.   albuterol (2.5 MG/3ML) 0.083% nebulizer solution Commonly known as: PROVENTIL Take 2.5 mg by nebulization every 6 (six) hours as needed for wheezing or shortness of breath.   aspirin EC 81 MG tablet Take 81 mg by mouth daily. Swallow whole.   budesonide-formoterol 160-4.5 MCG/ACT inhaler Commonly known as: SYMBICORT Inhale 2 puffs into the lungs 2 (two) times daily.   buPROPion 300 MG 24 hr tablet Commonly known as: WELLBUTRIN XL Take 300 mg by mouth daily.   busPIRone 10 MG tablet Commonly known as: BUSPAR Take 10 mg by mouth 3 (three) times daily.   metoprolol succinate 25 MG 24 hr tablet Commonly known as: TOPROL-XL Take 12.5 mg by mouth daily.   montelukast 10 MG tablet Commonly known as: SINGULAIR Take 10 mg by mouth at bedtime.   nitroGLYCERIN 0.4 MG SL tablet Commonly known as: NITROSTAT Place 0.4 mg under the tongue every 5 (five) minutes as needed for chest pain.   OXYGEN Inhale 2 L into the lungs as needed (shortness of breath).   tamsulosin 0.4 MG Caps capsule Commonly known as: FLOMAX Take 0.4 mg by mouth daily.   terazosin 2 MG capsule Commonly known as: HYTRIN Take 2 mg by mouth daily.          Signed: Juliet Rude , [facility] 10/12/2020, 11:16 AM Please see Amion for pager number during day hours 7:00am-4:30pm

## 2020-10-09 NOTE — H&P (Signed)
Physical Medicine and Rehabilitation Admission H&P    Chief Complaint  Patient presents with  . Level 1 fall  : HPI: Charles Weeks is a 55 year old right-handed male with history of COPD followed by Dr. Sherene Sires, CAD/MI with DES placement in RCA January 2020 maintained on aspirin, hypertension, depression/anxiety, BPH, alcohol use as well as tobacco.  Per chart review patient lives with his sister.  Independent prior to admission.  Two-level home bedroom downstairs.  Presented 08/17/2020 after a fall down approximately 11 stairs and was found by his sister.  Denied loss of consciousness.  Noted to be hypoxic in the 70s placed on nonrebreather mask.  Cranial CT scan negative for acute changes.  CT cervical spine showed mildly displaced acute fractures of the right C7 transverse process extending to involve the transverse foramen.  CT of the chest abdomen pelvis showed a large right pneumothorax with small amount of hemorrhage within the pleural space.  3 column fracture of the T8 vertebral body.  No retropulsion.  Fractures through the neural arch of T7.  Multiple right transverse process fractures involving the T4-T8 vertebral bodies.  Multiple bilateral rib fractures at the costovertebral junction from T5-T8 on the right and T7 and T8 on the left.  No evidence of solid organ injury.  CT angiogram of the neck showed no acute vascular injury to the neck.  Patient did sustain complex laceration of the scalp with repair.  Follow-up neurosurgery Dr. Yetta Barre in regards to 3 column T8 vertebral body fracture nonoperative placed in brace.  Right C7 transverse process fracture through foramen initially with cervical collar that was later discontinued.  Hospital course prolonged intubation undergoing tracheostomy as well as gastrostomy tube placement 09/03/2020 per Dr. Janee Morn.  Trach changed to a #6 cuffless 10/05/2020 due to secretions.  He was cleared Lovenox for DVT prophylaxis as well as being cleared to continue  home aspirin.  Acute blood loss anemia 10.1 and monitored.  Leukocytosis 19,200 improved to 13,500.  Bouts of urinary retention placed on Urecholine as well as Hytrin/Flomax.  Current dysphagia #2 thin liquid diet.  Palliative care consulted to establish goals of care.  Initial bouts of agitation or restlessness maintained on Seroquel.  Therapy evaluations completed due to patient decreased functional mobility was admitted for a comprehensive rehab program.  Pt reports Has R shoulder, leg and back pain- meds help some.  Feels like needs to have BM.  Sleeps until woken up by staff- sleeps well.  Cannot void yet- at all- getting in/out caths q6 hours by nurse.  Not hungry for lunch- LBM 1-2 days ago.   Review of Systems  Constitutional: Negative for chills and fever.  HENT: Negative for hearing loss.   Eyes: Negative for blurred vision and double vision.  Respiratory: Positive for cough and shortness of breath.   Cardiovascular: Negative for chest pain, palpitations and leg swelling.  Gastrointestinal: Positive for constipation. Negative for heartburn, nausea and vomiting.  Genitourinary: Positive for frequency and urgency. Negative for dysuria and hematuria.  Skin: Negative for rash.  Neurological: Positive for weakness.  Psychiatric/Behavioral: Positive for depression. The patient has insomnia.        Anxiety  All other systems reviewed and are negative.  Past Medical History:  Diagnosis Date  . BPH (benign prostatic hyperplasia) 08/17/2020  . CAD (coronary artery disease) 08/17/2020  . COPD (chronic obstructive pulmonary disease) (HCC) 08/17/2020   gold  . HTN (hypertension) 08/17/2020   Past Surgical History:  Procedure Laterality Date  .  PEG PLACEMENT N/A 09/03/2020   Procedure: PERCUTANEOUS ENDOSCOPIC GASTROSTOMY (PEG) PLACEMENT;  Surgeon: Violeta Gelinas, MD;  Location: [facility] OR;  Service: General;  Laterality: N/A;  . PERCUTANEOUS TRACHEOSTOMY N/A 09/03/2020   Procedure:  PERCUTANEOUS TRACHEOSTOMY USING 6 SHILEY;  Surgeon: Violeta Gelinas, MD;  Location: [facility] OR;  Service: General;  Laterality: N/A;   History reviewed. No pertinent family history. Social History:  reports that he has been smoking. He does not have any smokeless tobacco history on file. He reports current alcohol use. He reports previous drug use. Allergies: No Known Allergies Medications Prior to Admission  Medication Sig Dispense Refill  . albuterol (PROAIR HFA) 108 (90 Base) MCG/ACT inhaler Inhale 1-2 puffs into the lungs every 4 (four) hours as needed for wheezing or shortness of breath.    Marland Kitchen albuterol (PROVENTIL) (2.5 MG/3ML) 0.083% nebulizer solution Take 2.5 mg by nebulization every 6 (six) hours as needed for wheezing or shortness of breath.    Marland Kitchen aspirin EC 81 MG tablet Take 81 mg by mouth daily. Swallow whole.    . budesonide-formoterol (SYMBICORT) 160-4.5 MCG/ACT inhaler Inhale 2 puffs into the lungs 2 (two) times daily.    Marland Kitchen buPROPion (WELLBUTRIN XL) 300 MG 24 hr tablet Take 300 mg by mouth daily.    . busPIRone (BUSPAR) 10 MG tablet Take 10 mg by mouth 3 (three) times daily.    . metoprolol succinate (TOPROL-XL) 25 MG 24 hr tablet Take 12.5 mg by mouth daily.    . montelukast (SINGULAIR) 10 MG tablet Take 10 mg by mouth at bedtime.    . nitroGLYCERIN (NITROSTAT) 0.4 MG SL tablet Place 0.4 mg under the tongue every 5 (five) minutes as needed for chest pain.    . OXYGEN Inhale 2 L into the lungs as needed (shortness of breath).    . tamsulosin (FLOMAX) 0.4 MG CAPS capsule Take 0.4 mg by mouth daily.    Marland Kitchen terazosin (HYTRIN) 2 MG capsule Take 2 mg by mouth daily.      Drug Regimen Review Drug regimen was reviewed and remains appropriate with no significant issues identified  Home: Home Living Family/patient expects to be discharged to:: Unsure Type of Home: House Additional Comments: Pt unable to provide info  Lives With: Other (Comment) (sister)   Functional History: Prior  Function Comments: Pt unable to provide info.  Functional Status:  Mobility: Bed Mobility Overal bed mobility: Needs Assistance Bed Mobility: Rolling,Sit to Sidelying Rolling: Supervision Sidelying to sit: Min assist Supine to sit: Mod assist Sit to supine: Min assist Sit to sidelying: Supervision General bed mobility comments: Use of rail, HOB elevated, minA to execute bringing trunk to upright position Transfers Overall transfer level: Needs assistance Equipment used: Rolling walker (2 wheeled) Transfers: Sit to/from Stand Sit to Stand: Min assist General transfer comment: Light minA to steady upon ascent Ambulation/Gait Ambulation/Gait assistance: Min assist Gait Distance (Feet): 100 Feet Assistive device: Rolling walker (2 wheeled) Gait Pattern/deviations: Decreased stride length,Narrow base of support,Step-through pattern General Gait Details: Cues for walker proximity and activity pacing, requiring up to minA due to dynamic instability during turns Gait velocity: decreased Gait velocity interpretation: <1.31 ft/sec, indicative of household ambulator    ADL: ADL Overall ADL's : Needs assistance/impaired Eating/Feeding: Set up,Sitting Grooming: Wash/dry face,Minimal assistance,Sitting Grooming Details (indicate cue type and reason): terminates task quickly Upper Body Bathing Details (indicate cue type and reason): RN and OT working to address the posterior aspect of cervical collar for hygiene. pt noted to have area of  pressure on head and skin appears to have a craddle like cap appearance Lower Body Bathing: Total assistance,Bed level Lower Body Bathing Details (indicate cue type and reason): Total A for donning socks Upper Body Dressing : Maximal assistance,Bed level Upper Body Dressing Details (indicate cue type and reason): total (A) to don brace Lower Body Dressing: Total assistance Lower Body Dressing Details (indicate cue type and reason): don socks Toilet  Transfer: Moderate assistance,Stand-pivot,[name],RW Toilet Transfer Details (indicate cue type and reason): hand ove r hand to place hands Toileting- Clothing Manipulation and Hygiene: Minimal assistance,Sit to/from stand Toileting - Clothing Manipulation Details (indicate cue type and reason): pt with (A) to for steady doing peri care. pt with decreased saturations with attempt. OT finishing task max (A) . Functional mobility during ADLs: Minimal assistance,+2 for physical assistance,+2 for safety/equipment,Rolling walker General ADL Comments: pt required > 4 minutes to rebound to 90% after static standing with peri care. pt positioned in recliner  Cognition: Cognition Overall Cognitive Status: Impaired/Different from baseline Arousal/Alertness: Lethargic Orientation Level: Oriented X4 Attention: Sustained Sustained Attention: Impaired Sustained Attention Impairment: Verbal basic,Functional basic Memory:  (to be further assessed) Awareness: Impaired Awareness Impairment: Intellectual impairment,Emergent impairment,Anticipatory impairment Problem Solving: Impaired (will assess for severity level) Behaviors: Restless Safety/Judgment: Impaired [employer] Scales of Cognitive Functioning: Automatic/appropriate Cognition Arousal/Alertness: Awake/alert Behavior During Therapy: WFL for tasks assessed/performed Overall Cognitive Status: Impaired/Different from baseline Area of Impairment: Awareness,Problem solving,Memory Current Attention Level: Sustained Memory: Decreased recall of precautions,Decreased short-term memory Following Commands: Follows multi-step commands inconsistently,Follows multi-step commands with increased time,Follows one step commands consistently,Follows one step commands with increased time Safety/Judgement: Decreased awareness of safety,Decreased awareness of deficits Awareness: Emergent Problem Solving: Slow processing,Difficulty sequencing,Requires verbal  cues General Comments: Needs cues for monitoring fatigue, very motivated Difficult to assess due to: Tracheostomy  Physical Exam: Blood pressure 111/66, pulse 83, temperature 98.2 F (36.8 C), temperature source Oral, resp. rate 18, height 5\' 8"  (1.727 m), weight 80.5 kg, SpO2 90 %. Physical Exam Vitals and nursing note reviewed.  Constitutional:      Appearance: He is ill-appearing.     Comments: Pt sitting up in bedside chair; wearing CTO- also abd binder, O2 via TC, NAD- sats 88-90% on humidified air  HENT:     Head: Normocephalic and atraumatic.     Comments: Scalp laceration well-healed On R side Smile equal    Right Ear: External ear normal.     Left Ear: External ear normal.     Nose: Nose normal. No congestion.     Mouth/Throat:     Mouth: Mucous membranes are dry.     Pharynx: Oropharynx is clear. No oropharyngeal exudate.  Eyes:     General:        Right eye: No discharge.        Left eye: No discharge.     Extraocular Movements: Extraocular movements intact.  Neck:     Comments: Tracheostomy tube in place #6 shiley with PMV in place- O2- humidified air by TC Cardiovascular:     Comments: RRR_ no JVD Pulmonary:     Comments: Slightly coarse, but no W/R/R- however had 1 coarse, thick sounding cough- like congested (didn't sound it) likely upper airway sounds Abdominal:     Comments: PEG tube in place; abd binder being used to keep him fro messing with it- Slightly distended; normoactive BS; soft, NT  Musculoskeletal:     Cervical back: Neck supple.     Comments: UEs 5-5/ in all muscles  tested B/L LEs- HF and KE/KF 4+/5; DF and PF 4-/5 B/L  Skin:    Comments: Dry peeling skin on palms; no other skin breakdown seen   Neurological:     Comments: Patient is alert sitting up edge of bed.  Back brace in place.  Makes eye contact with examiner.  Follows simple commands.  Provides name and age.  Delayed responses and answering questions, but couldn't follow 2 step  commands.  Intact to light touch in all 4 extremities  Psychiatric:     Comments: Very flat affect     Results for orders placed or performed during the hospital encounter of 08/17/20 (from the past 48 hour(s))  Glucose, capillary     Status: Abnormal   Collection Time: 10/07/20  4:00 PM  Result Value Ref Range   Glucose-Capillary 144 (H) 70 - 99 mg/dL    Comment: Glucose reference range applies only to samples taken after fasting for at least 8 hours.  Glucose, capillary     Status: Abnormal   Collection Time: 10/07/20  7:50 PM  Result Value Ref Range   Glucose-Capillary 120 (H) 70 - 99 mg/dL    Comment: Glucose reference range applies only to samples taken after fasting for at least 8 hours.  Glucose, capillary     Status: Abnormal   Collection Time: 10/07/20 11:33 PM  Result Value Ref Range   Glucose-Capillary 171 (H) 70 - 99 mg/dL    Comment: Glucose reference range applies only to samples taken after fasting for at least 8 hours.  Glucose, capillary     Status: Abnormal   Collection Time: 10/08/20  3:45 AM  Result Value Ref Range   Glucose-Capillary 121 (H) 70 - 99 mg/dL    Comment: Glucose reference range applies only to samples taken after fasting for at least 8 hours.  Glucose, capillary     Status: Abnormal   Collection Time: 10/08/20  8:26 AM  Result Value Ref Range   Glucose-Capillary 113 (H) 70 - 99 mg/dL    Comment: Glucose reference range applies only to samples taken after fasting for at least 8 hours.  Glucose, capillary     Status: Abnormal   Collection Time: 10/08/20 11:44 AM  Result Value Ref Range   Glucose-Capillary 167 (H) 70 - 99 mg/dL    Comment: Glucose reference range applies only to samples taken after fasting for at least 8 hours.  Glucose, capillary     Status: Abnormal   Collection Time: 10/08/20  3:38 PM  Result Value Ref Range   Glucose-Capillary 171 (H) 70 - 99 mg/dL    Comment: Glucose reference range applies only to samples taken after  fasting for at least 8 hours.  Glucose, capillary     Status: Abnormal   Collection Time: 10/08/20  7:35 PM  Result Value Ref Range   Glucose-Capillary 133 (H) 70 - 99 mg/dL    Comment: Glucose reference range applies only to samples taken after fasting for at least 8 hours.  Glucose, capillary     Status: Abnormal   Collection Time: 10/08/20 11:26 PM  Result Value Ref Range   Glucose-Capillary 154 (H) 70 - 99 mg/dL    Comment: Glucose reference range applies only to samples taken after fasting for at least 8 hours.  Glucose, capillary     Status: Abnormal   Collection Time: 10/09/20  3:34 AM  Result Value Ref Range   Glucose-Capillary 135 (H) 70 - 99 mg/dL    Comment:  Glucose reference range applies only to samples taken after fasting for at least 8 hours.  Glucose, capillary     Status: Abnormal   Collection Time: 10/09/20  8:06 AM  Result Value Ref Range   Glucose-Capillary 124 (H) 70 - 99 mg/dL    Comment: Glucose reference range applies only to samples taken after fasting for at least 8 hours.  Glucose, capillary     Status: Abnormal   Collection Time: 10/09/20 11:34 AM  Result Value Ref Range   Glucose-Capillary 160 (H) 70 - 99 mg/dL    Comment: Glucose reference range applies only to samples taken after fasting for at least 8 hours.   DG Thoracic Spine 2 View  Result Date: 10/08/2020 CLINICAL DATA:  T8 fracture. EXAM: THORACIC SPINE 2 VIEWS COMPARISON:  08/25/2020 and CT thoracic spine 09/11/2020. FINDINGS: Dextroconvex curvature of the thoracic spine. T8 through T12 compression fractures are poorly visualized. Cervicothoracic junction is obscured by the patient's shoulders. Small bilateral pleural effusions on the lateral view. Percutaneous gastrostomy. IMPRESSION: 1. T8 through T12 compression deformities are poorly visualized. 2. Small bilateral pleural effusions. Electronically Signed   By: Leanna Battles M.D.   On: 10/08/2020 14:30   CT THORACIC SPINE WO CONTRAST  Result  Date: 10/08/2020 CLINICAL DATA:  Follow-up examination for spine fracture. EXAM: CT THORACIC SPINE WITHOUT CONTRAST TECHNIQUE: Multidetector CT images of the thoracic were obtained using the standard protocol without intravenous contrast. COMPARISON:  Prior CT from 09/11/2020. FINDINGS: Alignment: Exaggeration of the normal thoracic kyphosis with focal kyphotic angulation at T8-9, increased from previous. No listhesis. Vertebrae: There has been continued interval collapse of previously identified comminuted burst type compression fracture involving the T8 vertebral body. Associated height loss now measures up to approximately 50% with no more than trace 2 mm bony retropulsion. Mild asymmetric widening of the right T8-9 facet, similar to previous. Height loss about the previously identified comminuted a T9 compression fracture is relatively stable. Osseous remodeling with persistent nonunion about these fractures. Additional compression fractures involving the superior aspects of T10 and T11 again seen. No more than mild central height loss without bony retropulsion, similar to previous. Probable additional subtle evolving compression fracture noted involving the superior endplate of T12 with no more than minimal central height loss. Additional probable subacute compression fracture involving the right aspect of the superior endplate of T7 with no more than mild height loss. These were likely present on previous exam. Vertebral body height otherwise maintained with no other acute or interval fracture. Subacute mildly displaced fractures involving the T6, T7, and T8 spinous process is again noted. Subacute fractures of the right posteromedial third through eleventh ribs. Additional subacute healing fractures of the left posterior seventh through ninth ribs. Additional subacute fractures of the right third through tenth transverse process fractures again noted. Paraspinal and other soft tissues: Mild paraspinous edema  noted about the subacute healing fractures, most pronounced at T8-9. Moderate layering bilateral pleural effusions. Tracheostomy tube in place. Underlying emphysema. Disc levels: Multilevel degenerative disc desiccation without high-grade spinal stenosis. IMPRESSION: 1. Partial interval collapse of previously identified comminuted burst type compression fracture involving the T8 vertebral body, now with up to approximately 50% height loss and trace 2 mm bony retropulsion. 2. Stable height loss about the additional compression fractures involving the T7, T9, T10, T11, and likely T12 vertebral bodies. 3. Subacute mildly displaced fractures involving the T6, T7, and T8 spinous processes. 4. Subacute healing fractures of the right posteromedial third through eleventh ribs, left  posterior seventh through ninth ribs, and right third through tenth transverse processes. 5. Moderate layering bilateral pleural effusions with underlying emphysema. Emphysema (ICD10-J43.9). Electronically Signed   By: Rise Mu M.D.   On: 10/08/2020 23:44   DG Cerv Spine Flex&Ext Only  Result Date: 10/08/2020 CLINICAL DATA:  History of thoracic spine fractures due to a fall 09/11/2020. EXAM: CERVICAL SPINE - FLEXION AND EXTENSION VIEWS ONLY COMPARISON:  Flexion and extension views of the cervical spine 10/07/2020. FINDINGS: No fracture is identified. Range of motion appears limited but no pathologic motion is identified. Multilevel cervical spondylosis is worst at C4-5 and C5-6. Tracheostomy tube noted. IMPRESSION: No acute abnormality.  Negative for pathologic motion. Degenerative disc disease. Electronically Signed   By: Drusilla Kanner M.D.   On: 10/08/2020 14:30       Medical Problem List and Plan: 1.  Debility secondary to fall with 3 column T8 vertebral body fracture/T7 neural arch fracture/right TVP T4-8/right C7 transverse process fracture through the foramen.  TLSO back brace as advised applied in supine  position  -patient may shower if can get him new pads for CTO and get cover for trach  -ELOS/Goals: ~2-2.5 weeks- supervision to min A 2.  Antithrombotics: -DVT/anticoagulation: Lovenox.  Check vascular study  -antiplatelet therapy: Aspirin 81 mg daily 3. Pain Management: Oxycodone as needed 4. Mood: Wellbutrin 300 mg daily, BuSpar 10 mg 3 times daily, Klonopin 0.5 mg nightly,   -antipsychotic agents: Seroquel 150 mg twice daily and 300 mg nightly 5. Neuropsych: This patient is not capable of making decisions on his own behalf. 6. Skin/Wound Care: Routine skin checks 7. Fluids/Electrolytes/Nutrition: Routine in and outs with follow-up chemistries 8.  Acute blood loss anemia.  Follow-up CBC 9.  Multiple bilateral rib fractures.  Conservative care 10.  3: T8 vertebral body fracture.  Follow-up neurosurgery Dr. Yetta Barre.  Nonoperative.  Brace as directed 11.  T7 neural arch fracture.  Follow-up neurosurgery 12.  Right TVP T4-8.  Pain control follow neurosurgery 13.  Right C7 TVP fracture through foramen.  CTA negative.  Cervical collar discontinued. 14.  Large scalp laceration right eyebrow laceration.  Repaired 08/17/2020.  Sutures removed 15.  Hypertension.  Lopressor 25 mg twice daily 16.  CAD/MI/DES.  Continue aspirin 17.  Acute hypercarbic ventilatory dependent respiratory failure/COPD.  Tracheostomy 09/03/2020 per Dr. Janee Morn.  Changed back to #6 cuffless 10/05/2020 due to secretions.  Patient will follow up long-term with Dr. Sherene Sires 18.  Dysphagia.  Gastrostomy tube 09/03/2020 per Dr. Janee Morn.  Dysphagia #2 thin liquids 19.  BPH.  Continue Hytrin/Flomax/Urecholine. Pt is requiring in/out caths q6 hours until he empties/voids.  20.  History of alcohol tobacco abuse.  Provide counseling 21. Constipation? Pt might need intervention- he needed to go this AM, but didn't see if had BM?   Mcarthur Rossetti Angiulli, PA-C 10/09/2020    I have personally performed a face to face diagnostic evaluation of  this patient and formulated the key components of the plan.  Additionally, I have personally reviewed laboratory data, imaging studies, as well as relevant notes and concur with the physician assistant's documentation above.   The patient's status has not changed from the original H&P.  Any changes in documentation from the acute care chart have been noted above.

## 2020-10-09 NOTE — Progress Notes (Signed)
Report called to CIR patient will be going to [id number]. Family has been notified of patients transfer.

## 2020-10-09 NOTE — Discharge Instructions (Addendum)
Inpatient Rehab Discharge Instructions  Theo Krumholz Discharge date and time: No discharge date for patient encounter.   Activities/Precautions/ Functional Status: Activity: Back brace as directed Diet: Regular Wound Care: Routine skin checks Functional status:  ___ No restrictions     ___ Walk up steps independently ___ 24/7 supervision/assistance   ___ Walk up steps with assistance ___ Intermittent supervision/assistance  ___ Bathe/dress independently ___ Walk with walker     _x__ Bathe/dress with assistance ___ Walk Independently    ___ Shower independently ___ Walk with assistance    ___ Shower with assistance ___ No alcohol     ___ Return to work/school ________  COMMUNITY REFERRALS UPON DISCHARGE:    Outpatient: PT    OT    ST                 Agency: [facility] Phone: [phone]             Appointment Date/Time:Will call to set up appointments  Medical Equipment/Items Ordered:trach care kits supplies (#6 Shiley cufless), portable suction, humidification, oxygen, nebulizer machine, Trach Suction Kit, Rolling Walker , Bedside Commode                                                  Agency/Supplier:[facility] [phone]  GENERAL COMMUNITY RESOURCES FOR PATIENT/FAMILY: Please be sure to follow-up with [place] [employer] to discuss status of PCS referral submitted.   Special Instructions: No driving smoking or alcohol  Follow-up with Dr. Lavone Neri Thompson/surgery [phone] to establish plans for gastrostomy tube removal once plan completed for tracheostomy and possible need for further surgery  Follow-up otolaryngology for outpatient laryngoscopy dilatation   My questions have been answered and I understand these instructions. I will adhere to these goals and the provided educational materials after my discharge from the hospital.  Patient/Caregiver Signature _______________________________ Date __________  Clinician Signature  _______________________________________ Date __________  Please bring this form and your medication list with you to all your follow-up doctor's appointments.    RE trach:   Nation needs to be seen By Dr Ronnald Ramp first and cleared to be out of his brace. After that he needs to be seen By Dr Fredric Dine with ENT. 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 His trach is due for change the First week of September. If has not seen ENT at that point or ENT appointment still pending He can see Marni Griffon at trach clinic for routine trach changes as he awaits ENT intervention.

## 2020-10-09 NOTE — Progress Notes (Addendum)
Inpatient Rehab Admissions Coordinator:   I did receive insurance auth for CIR for this patient but I do not have a bed available today.  Will follow for admission as soon as bed becomes available. I updated pt at the bedside.   Estill Dooms, PT, DPT Admissions Coordinator [phone] 10/09/20  11:34 AM

## 2020-10-09 NOTE — H&P (Signed)
Physical Medicine and Rehabilitation Admission H&P        Chief Complaint  Patient presents with  . Level 1 fall  : HPI: Charles Weeks is a 55 year old right-handed male with history of COPD followed by Dr. Sherene Sires, CAD/MI with DES placement in RCA January 2020 maintained on aspirin, hypertension, depression/anxiety, BPH, alcohol use as well as tobacco.  Per chart review patient lives with his sister.  Independent prior to admission.  Two-level home bedroom downstairs.  Presented 08/17/2020 after a fall down approximately 11 stairs and was found by his sister.  Denied loss of consciousness.  Noted to be hypoxic in the 70s placed on nonrebreather mask.  Cranial CT scan negative for acute changes.  CT cervical spine showed mildly displaced acute fractures of the right C7 transverse process extending to involve the transverse foramen.  CT of the chest abdomen pelvis showed a large right pneumothorax with small amount of hemorrhage within the pleural space.  3 column fracture of the T8 vertebral body.  No retropulsion.  Fractures through the neural arch of T7.  Multiple right transverse process fractures involving the T4-T8 vertebral bodies.  Multiple bilateral rib fractures at the costovertebral junction from T5-T8 on the right and T7 and T8 on the left.  No evidence of solid organ injury.  CT angiogram of the neck showed no acute vascular injury to the neck.  Patient did sustain complex laceration of the scalp with repair.  Follow-up neurosurgery Dr. Yetta Barre in regards to 3 column T8 vertebral body fracture nonoperative placed in brace.  Right C7 transverse process fracture through foramen initially with cervical collar that was later discontinued.  Hospital course prolonged intubation undergoing tracheostomy as well as gastrostomy tube placement 09/03/2020 per Dr. Janee Morn.  Trach changed to a #6 cuffless 10/05/2020 due to secretions.  He was cleared Lovenox for DVT prophylaxis as well as being cleared to  continue home aspirin.  Acute blood loss anemia 10.1 and monitored.  Leukocytosis 19,200 improved to 13,500.  Bouts of urinary retention placed on Urecholine as well as Hytrin/Flomax.  Current dysphagia #2 thin liquid diet.  Palliative care consulted to establish goals of care.  Initial bouts of agitation or restlessness maintained on Seroquel.  Therapy evaluations completed due to patient decreased functional mobility was admitted for a comprehensive rehab program.   Pt reports Has R shoulder, leg and back pain- meds help some.  Feels like needs to have BM.  Sleeps until woken up by staff- sleeps well.  Cannot void yet- at all- getting in/out caths q6 hours by nurse.  Not hungry for lunch- LBM 1-2 days ago.    Review of Systems  Constitutional: Negative for chills and fever.  HENT: Negative for hearing loss.   Eyes: Negative for blurred vision and double vision.  Respiratory: Positive for cough and shortness of breath.   Cardiovascular: Negative for chest pain, palpitations and leg swelling.  Gastrointestinal: Positive for constipation. Negative for heartburn, nausea and vomiting.  Genitourinary: Positive for frequency and urgency. Negative for dysuria and hematuria.  Skin: Negative for rash.  Neurological: Positive for weakness.  Psychiatric/Behavioral: Positive for depression. The patient has insomnia.        Anxiety  All other systems reviewed and are negative.       Past Medical History:  Diagnosis Date  . BPH (benign prostatic hyperplasia) 08/17/2020  . CAD (coronary artery disease) 08/17/2020  . COPD (chronic obstructive pulmonary disease) (HCC) 08/17/2020    gold  .  HTN (hypertension) 08/17/2020         Past Surgical History:  Procedure Laterality Date  . PEG PLACEMENT N/A 09/03/2020    Procedure: PERCUTANEOUS ENDOSCOPIC GASTROSTOMY (PEG) PLACEMENT;  Surgeon: Violeta Gelinas, MD;  Location: [facility] OR;  Service: General;  Laterality: N/A;  . PERCUTANEOUS TRACHEOSTOMY N/A  09/03/2020    Procedure: PERCUTANEOUS TRACHEOSTOMY USING 6 SHILEY;  Surgeon: Violeta Gelinas, MD;  Location: [facility] OR;  Service: General;  Laterality: N/A;    History reviewed. No pertinent family history. Social History:  reports that he has been smoking. He does not have any smokeless tobacco history on file. He reports current alcohol use. He reports previous drug use. Allergies: No Known Allergies       Medications Prior to Admission  Medication Sig Dispense Refill  . albuterol (PROAIR HFA) 108 (90 Base) MCG/ACT inhaler Inhale 1-2 puffs into the lungs every 4 (four) hours as needed for wheezing or shortness of breath.      Marland Kitchen albuterol (PROVENTIL) (2.5 MG/3ML) 0.083% nebulizer solution Take 2.5 mg by nebulization every 6 (six) hours as needed for wheezing or shortness of breath.      Marland Kitchen aspirin EC 81 MG tablet Take 81 mg by mouth daily. Swallow whole.      . budesonide-formoterol (SYMBICORT) 160-4.5 MCG/ACT inhaler Inhale 2 puffs into the lungs 2 (two) times daily.      Marland Kitchen buPROPion (WELLBUTRIN XL) 300 MG 24 hr tablet Take 300 mg by mouth daily.      . busPIRone (BUSPAR) 10 MG tablet Take 10 mg by mouth 3 (three) times daily.      . metoprolol succinate (TOPROL-XL) 25 MG 24 hr tablet Take 12.5 mg by mouth daily.      . montelukast (SINGULAIR) 10 MG tablet Take 10 mg by mouth at bedtime.      . nitroGLYCERIN (NITROSTAT) 0.4 MG SL tablet Place 0.4 mg under the tongue every 5 (five) minutes as needed for chest pain.      . OXYGEN Inhale 2 L into the lungs as needed (shortness of breath).      . tamsulosin (FLOMAX) 0.4 MG CAPS capsule Take 0.4 mg by mouth daily.      Marland Kitchen terazosin (HYTRIN) 2 MG capsule Take 2 mg by mouth daily.          Drug Regimen Review Drug regimen was reviewed and remains appropriate with no significant issues identified   Home: Home Living Family/patient expects to be discharged to:: Unsure Type of Home: House Additional Comments: Pt unable to provide info  Lives With:  Other (Comment) (sister)   Functional History: Prior Function Comments: Pt unable to provide info.   Functional Status:  Mobility: Bed Mobility Overal bed mobility: Needs Assistance Bed Mobility: Rolling,Sit to Sidelying Rolling: Supervision Sidelying to sit: Min assist Supine to sit: Mod assist Sit to supine: Min assist Sit to sidelying: Supervision General bed mobility comments: Use of rail, HOB elevated, minA to execute bringing trunk to upright position Transfers Overall transfer level: Needs assistance Equipment used: Rolling walker (2 wheeled) Transfers: Sit to/from Stand Sit to Stand: Min assist General transfer comment: Light minA to steady upon ascent Ambulation/Gait Ambulation/Gait assistance: Min assist Gait Distance (Feet): 100 Feet Assistive device: Rolling walker (2 wheeled) Gait Pattern/deviations: Decreased stride length,Narrow base of support,Step-through pattern General Gait Details: Cues for walker proximity and activity pacing, requiring up to minA due to dynamic instability during turns Gait velocity: decreased Gait velocity interpretation: <1.31 ft/sec, indicative of household  ambulator   ADL: ADL Overall ADL's : Needs assistance/impaired Eating/Feeding: Set up,Sitting Grooming: Wash/dry face,Minimal assistance,Sitting Grooming Details (indicate cue type and reason): terminates task quickly Upper Body Bathing Details (indicate cue type and reason): RN and OT working to address the posterior aspect of cervical collar for hygiene. pt noted to have area of pressure on head and skin appears to have a craddle like cap appearance Lower Body Bathing: Total assistance,Bed level Lower Body Bathing Details (indicate cue type and reason): Total A for donning socks Upper Body Dressing : Maximal assistance,Bed level Upper Body Dressing Details (indicate cue type and reason): total (A) to don brace Lower Body Dressing: Total assistance Lower Body Dressing Details  (indicate cue type and reason): don socks Toilet Transfer: Moderate assistance,Stand-pivot,[name],RW Toilet Transfer Details (indicate cue type and reason): hand ove r hand to place hands Toileting- Clothing Manipulation and Hygiene: Minimal assistance,Sit to/from stand Toileting - Clothing Manipulation Details (indicate cue type and reason): pt with (A) to for steady doing peri care. pt with decreased saturations with attempt. OT finishing task max (A) . Functional mobility during ADLs: Minimal assistance,+2 for physical assistance,+2 for safety/equipment,Rolling walker General ADL Comments: pt required > 4 minutes to rebound to 90% after static standing with peri care. pt positioned in recliner   Cognition: Cognition Overall Cognitive Status: Impaired/Different from baseline Arousal/Alertness: Lethargic Orientation Level: Oriented X4 Attention: Sustained Sustained Attention: Impaired Sustained Attention Impairment: Verbal basic,Functional basic Memory:  (to be further assessed) Awareness: Impaired Awareness Impairment: Intellectual impairment,Emergent impairment,Anticipatory impairment Problem Solving: Impaired (will assess for severity level) Behaviors: Restless Safety/Judgment: Impaired Rancho Mirant Scales of Cognitive Functioning: Automatic/appropriate Cognition Arousal/Alertness: Awake/alert Behavior During Therapy: WFL for tasks assessed/performed Overall Cognitive Status: Impaired/Different from baseline Area of Impairment: Awareness,Problem solving,Memory Current Attention Level: Sustained Memory: Decreased recall of precautions,Decreased short-term memory Following Commands: Follows multi-step commands inconsistently,Follows multi-step commands with increased time,Follows one step commands consistently,Follows one step commands with increased time Safety/Judgement: Decreased awareness of safety,Decreased awareness of deficits Awareness: Emergent Problem Solving: Slow  processing,Difficulty sequencing,Requires verbal cues General Comments: Needs cues for monitoring fatigue, very motivated Difficult to assess due to: Tracheostomy   Physical Exam: Blood pressure 111/66, pulse 83, temperature 98.2 F (36.8 C), temperature source Oral, resp. rate 18, height 5\' 8"  (1.727 m), weight 80.5 kg, SpO2 90 %. Physical Exam Vitals and nursing note reviewed.  Constitutional:      Appearance: He is ill-appearing.     Comments: Pt sitting up in bedside chair; wearing CTO- also abd binder, O2 via TC, NAD- sats 88-90% on humidified air  HENT:     Head: Normocephalic and atraumatic.     Comments: Scalp laceration well-healed On R side Smile equal    Right Ear: External ear normal.     Left Ear: External ear normal.     Nose: Nose normal. No congestion.     Mouth/Throat:     Mouth: Mucous membranes are dry.     Pharynx: Oropharynx is clear. No oropharyngeal exudate.  Eyes:     General:        Right eye: No discharge.        Left eye: No discharge.     Extraocular Movements: Extraocular movements intact.  Neck:     Comments: Tracheostomy tube in place #6 shiley with PMV in place- O2- humidified air by TC Cardiovascular:     Comments: RRR_ no JVD Pulmonary:     Comments: Slightly coarse, but no W/R/R- however had 1 coarse, thick  sounding cough- like congested (didn't sound it) likely upper airway sounds Abdominal:     Comments: PEG tube in place; abd binder being used to keep him fro messing with it- Slightly distended; normoactive BS; soft, NT  Musculoskeletal:     Cervical back: Neck supple.     Comments: UEs 5-5/ in all muscles tested B/L LEs- HF and KE/KF 4+/5; DF and PF 4-/5 B/L  Skin:    Comments: Dry peeling skin on palms; no other skin breakdown seen   Neurological:     Comments: Patient is alert sitting up edge of bed.  Back brace in place.  Makes eye contact with examiner.  Follows simple commands.  Provides name and age.  Delayed responses and  answering questions, but couldn't follow 2 step commands.  Intact to light touch in all 4 extremities  Psychiatric:     Comments: Very flat affect        Lab Results Last 48 Hours        Results for orders placed or performed during the hospital encounter of 08/17/20 (from the past 48 hour(s))  Glucose, capillary     Status: Abnormal    Collection Time: 10/07/20  4:00 PM  Result Value Ref Range    Glucose-Capillary 144 (H) 70 - 99 mg/dL      Comment: Glucose reference range applies only to samples taken after fasting for at least 8 hours.  Glucose, capillary     Status: Abnormal    Collection Time: 10/07/20  7:50 PM  Result Value Ref Range    Glucose-Capillary 120 (H) 70 - 99 mg/dL      Comment: Glucose reference range applies only to samples taken after fasting for at least 8 hours.  Glucose, capillary     Status: Abnormal    Collection Time: 10/07/20 11:33 PM  Result Value Ref Range    Glucose-Capillary 171 (H) 70 - 99 mg/dL      Comment: Glucose reference range applies only to samples taken after fasting for at least 8 hours.  Glucose, capillary     Status: Abnormal    Collection Time: 10/08/20  3:45 AM  Result Value Ref Range    Glucose-Capillary 121 (H) 70 - 99 mg/dL      Comment: Glucose reference range applies only to samples taken after fasting for at least 8 hours.  Glucose, capillary     Status: Abnormal    Collection Time: 10/08/20  8:26 AM  Result Value Ref Range    Glucose-Capillary 113 (H) 70 - 99 mg/dL      Comment: Glucose reference range applies only to samples taken after fasting for at least 8 hours.  Glucose, capillary     Status: Abnormal    Collection Time: 10/08/20 11:44 AM  Result Value Ref Range    Glucose-Capillary 167 (H) 70 - 99 mg/dL      Comment: Glucose reference range applies only to samples taken after fasting for at least 8 hours.  Glucose, capillary     Status: Abnormal    Collection Time: 10/08/20  3:38 PM  Result Value Ref Range     Glucose-Capillary 171 (H) 70 - 99 mg/dL      Comment: Glucose reference range applies only to samples taken after fasting for at least 8 hours.  Glucose, capillary     Status: Abnormal    Collection Time: 10/08/20  7:35 PM  Result Value Ref Range    Glucose-Capillary 133 (H) 70 - 99 mg/dL  Comment: Glucose reference range applies only to samples taken after fasting for at least 8 hours.  Glucose, capillary     Status: Abnormal    Collection Time: 10/08/20 11:26 PM  Result Value Ref Range    Glucose-Capillary 154 (H) 70 - 99 mg/dL      Comment: Glucose reference range applies only to samples taken after fasting for at least 8 hours.  Glucose, capillary     Status: Abnormal    Collection Time: 10/09/20  3:34 AM  Result Value Ref Range    Glucose-Capillary 135 (H) 70 - 99 mg/dL      Comment: Glucose reference range applies only to samples taken after fasting for at least 8 hours.  Glucose, capillary     Status: Abnormal    Collection Time: 10/09/20  8:06 AM  Result Value Ref Range    Glucose-Capillary 124 (H) 70 - 99 mg/dL      Comment: Glucose reference range applies only to samples taken after fasting for at least 8 hours.  Glucose, capillary     Status: Abnormal    Collection Time: 10/09/20 11:34 AM  Result Value Ref Range    Glucose-Capillary 160 (H) 70 - 99 mg/dL      Comment: Glucose reference range applies only to samples taken after fasting for at least 8 hours.       Imaging Results (Last 48 hours)  DG Thoracic Spine 2 View   Result Date: 10/08/2020 CLINICAL DATA:  T8 fracture. EXAM: THORACIC SPINE 2 VIEWS COMPARISON:  08/25/2020 and CT thoracic spine 09/11/2020. FINDINGS: Dextroconvex curvature of the thoracic spine. T8 through T12 compression fractures are poorly visualized. Cervicothoracic junction is obscured by the patient's shoulders. Small bilateral pleural effusions on the lateral view. Percutaneous gastrostomy. IMPRESSION: 1. T8 through T12 compression deformities  are poorly visualized. 2. Small bilateral pleural effusions. Electronically Signed   By: Leanna Battles M.D.   On: 10/08/2020 14:30    CT THORACIC SPINE WO CONTRAST   Result Date: 10/08/2020 CLINICAL DATA:  Follow-up examination for spine fracture. EXAM: CT THORACIC SPINE WITHOUT CONTRAST TECHNIQUE: Multidetector CT images of the thoracic were obtained using the standard protocol without intravenous contrast. COMPARISON:  Prior CT from 09/11/2020. FINDINGS: Alignment: Exaggeration of the normal thoracic kyphosis with focal kyphotic angulation at T8-9, increased from previous. No listhesis. Vertebrae: There has been continued interval collapse of previously identified comminuted burst type compression fracture involving the T8 vertebral body. Associated height loss now measures up to approximately 50% with no more than trace 2 mm bony retropulsion. Mild asymmetric widening of the right T8-9 facet, similar to previous. Height loss about the previously identified comminuted a T9 compression fracture is relatively stable. Osseous remodeling with persistent nonunion about these fractures. Additional compression fractures involving the superior aspects of T10 and T11 again seen. No more than mild central height loss without bony retropulsion, similar to previous. Probable additional subtle evolving compression fracture noted involving the superior endplate of T12 with no more than minimal central height loss. Additional probable subacute compression fracture involving the right aspect of the superior endplate of T7 with no more than mild height loss. These were likely present on previous exam. Vertebral body height otherwise maintained with no other acute or interval fracture. Subacute mildly displaced fractures involving the T6, T7, and T8 spinous process is again noted. Subacute fractures of the right posteromedial third through eleventh ribs. Additional subacute healing fractures of the left posterior seventh  through ninth ribs. Additional subacute  fractures of the right third through tenth transverse process fractures again noted. Paraspinal and other soft tissues: Mild paraspinous edema noted about the subacute healing fractures, most pronounced at T8-9. Moderate layering bilateral pleural effusions. Tracheostomy tube in place. Underlying emphysema. Disc levels: Multilevel degenerative disc desiccation without high-grade spinal stenosis. IMPRESSION: 1. Partial interval collapse of previously identified comminuted burst type compression fracture involving the T8 vertebral body, now with up to approximately 50% height loss and trace 2 mm bony retropulsion. 2. Stable height loss about the additional compression fractures involving the T7, T9, T10, T11, and likely T12 vertebral bodies. 3. Subacute mildly displaced fractures involving the T6, T7, and T8 spinous processes. 4. Subacute healing fractures of the right posteromedial third through eleventh ribs, left posterior seventh through ninth ribs, and right third through tenth transverse processes. 5. Moderate layering bilateral pleural effusions with underlying emphysema. Emphysema (ICD10-J43.9). Electronically Signed   By: Rise MuBenjamin  McClintock M.D.   On: 10/08/2020 23:44    DG Cerv Spine Flex&Ext Only   Result Date: 10/08/2020 CLINICAL DATA:  History of thoracic spine fractures due to a fall 09/11/2020. EXAM: CERVICAL SPINE - FLEXION AND EXTENSION VIEWS ONLY COMPARISON:  Flexion and extension views of the cervical spine 10/07/2020. FINDINGS: No fracture is identified. Range of motion appears limited but no pathologic motion is identified. Multilevel cervical spondylosis is worst at C4-5 and C5-6. Tracheostomy tube noted. IMPRESSION: No acute abnormality.  Negative for pathologic motion. Degenerative disc disease. Electronically Signed   By: Drusilla Kannerhomas  Dalessio M.D.   On: 10/08/2020 14:30             Medical Problem List and Plan: 1.  Debility secondary to fall  with 3 column T8 vertebral body fracture/T7 neural arch fracture/right TVP T4-8/right C7 transverse process fracture through the foramen.  TLSO back brace as advised applied in supine position             -patient may shower if can get him new pads for CTO and get cover for trach             -ELOS/Goals: ~2-2.5 weeks- supervision to min A 2.  Antithrombotics: -DVT/anticoagulation: Lovenox.  Check vascular study             -antiplatelet therapy: Aspirin 81 mg daily 3. Pain Management: Oxycodone as needed 4. Mood: Wellbutrin 300 mg daily, BuSpar 10 mg 3 times daily, Klonopin 0.5 mg nightly,              -antipsychotic agents: Seroquel 150 mg twice daily and 300 mg nightly 5. Neuropsych: This patient is not capable of making decisions on his own behalf. 6. Skin/Wound Care: Routine skin checks 7. Fluids/Electrolytes/Nutrition: Routine in and outs with follow-up chemistries 8.  Acute blood loss anemia.  Follow-up CBC 9.  Multiple bilateral rib fractures.  Conservative care 10.  3: T8 vertebral body fracture.  Follow-up neurosurgery Dr. Yetta BarreJones.  Nonoperative.  Brace as directed 11.  T7 neural arch fracture.  Follow-up neurosurgery 12.  Right TVP T4-8.  Pain control follow neurosurgery 13.  Right C7 TVP fracture through foramen.  CTA negative.  Cervical collar discontinued. 14.  Large scalp laceration right eyebrow laceration.  Repaired 08/17/2020.  Sutures removed 15.  Hypertension.  Lopressor 25 mg twice daily 16.  CAD/MI/DES.  Continue aspirin 17.  Acute hypercarbic ventilatory dependent respiratory failure/COPD.  Tracheostomy 09/03/2020 per Dr. Janee Mornhompson.  Changed back to #6 cuffless 10/05/2020 due to secretions.  Patient will follow up long-term with Dr. Sherene SiresWert  18.  Dysphagia.  Gastrostomy tube 09/03/2020 per Dr. Janee Morn.  Dysphagia #2 thin liquids 19.  BPH.  Continue Hytrin/Flomax/Urecholine. Pt is requiring in/out caths q6 hours until he empties/voids.  20.  History of alcohol tobacco abuse.   Provide counseling 21. Constipation? Pt might need intervention- he needed to go this AM, but didn't see if had BM?     Mcarthur Rossetti Angiulli, PA-C 10/09/2020      I have personally performed a face to face diagnostic evaluation of this patient and formulated the key components of the plan.  Additionally, I have personally reviewed laboratory data, imaging studies, as well as relevant notes and concur with the physician assistant's documentation above.   The patient's status has not changed from the original H&P.  Any changes in documentation from the acute care chart have been noted above.

## 2020-10-09 NOTE — Progress Notes (Signed)
PMR Admission Coordinator Pre-Admission Assessment  Patient: Charles Weeks is an 55 y.o., male MRN: [id number] DOB: [birth year] Height: '5\' 8"'  (172.7 cm) Weight: 80.5 kg  Insurance Information HMO:     PPO:      PCP:      IPA:      80/20:      OTHER:  PRIMARY: [employer] Medicaid      Policy#: [id number]      Subscriber: pt CM Name: Marjorie Smolder      Phone#: [phone]     Fax#: [phone] Pre-Cert#: [id number] auth for CIR provided via fax from [place] with updates due to fax listed above on 5/30.      Employer:  Benefits:  Phone #: [phone]     Name:  Eff. Date: 11/17/19     Deduct: $0      Out of Pocket Max: $0      Life Max: n/a CIR: 100%      SNF:  Outpatient:      Co-Pay:  Home Health:       Co-Pay:  DME:      Co-Pay:  Providers:  SECONDARY:       Policy#:      Phone#:   Development worker, community:       Phone#:  [employer] Engineer, [employer]" for patients in Inpatient Rehabilitation Facilities with attached "Privacy Act Price Records" was provided and verbally reviewed with: N/A  Emergency Contact Information         Contact Information    Name Relation Home Work Davenport, Lyons Daughter   Silsbee, Jameson Daughter   [phone]   Mahalia Longest Sister   [phone]      Current Medical History  Patient Admitting Diagnosis: debility/polytrauma  History of Present Illness: Charles Weeks is a 55 year old right-handed male with history of COPD followed by Dr. Melvyn Novas, CAD/MI with DES placement in RCA January 2020 maintained on aspirin, hypertension, depression/anxiety, BPH, alcohol use as well as tobacco. Presented 08/17/2020 after a fall down approximately 11 stairs and was found by his sister. Denied loss of consciousness. Noted to be hypoxic in the 70s placed on nonrebreather mask. Cranial CT scan negative for acute changes. CT cervical spine showed mildly displaced acute fractures of the right C7 transverse  process extending to involve the transverse foramen. CT of the chest abdomen pelvis showed a large right pneumothorax with small amount of hemorrhage within the pleural space. 3 column fracture of the T8 vertebral body. No retropulsion. Fractures through the neural arch of T7. Multiple right transverse process fractures involving the T4-T8 vertebral bodies. Multiple bilateral rib fractures at the costovertebral junction from T5-T8 on the right and T7 and T8 on the left. No evidence of solid organ injury. CT angiogram of the neck showed no acute vascular injury to the neck. Patient did sustain complex laceration of the scalp with repair. Follow-up neurosurgery Dr. Ronnald Ramp in regards to 3 column T8 vertebral body fracture nonoperative placed in brace. Right C7 transverse process fracture through foramen initially with cervical collar that was discontinued 5/23. Hospital course prolonged intubation, and vent wean, undergoing tracheostomy as well as gastrostomy tube placement 09/03/2020 per Dr. Grandville Silos. Lurline Idol downsized to a 4 cuffless on 5/19 but changed back to a #6 cuffless 10/05/2020 due to secretions. He was cleared for Lovenox for DVT prophylaxis as well as being cleared to continue home aspirin. Acute blood loss anemia 10.1 and monitored. Leukocytosis 19,200 improved to 13,500. Bouts of urinary  retention placed on Urecholine as well as Hytrin/Flomax. Current dysphagia #2 thin liquid diet. Palliative care consulted to establish goals of care. Initial bouts of agitation or restlessness maintained on Seroquel. Therapy evaluations completed due to patient decreased functional mobility was admitted for a comprehensive rehab program.  Patient's medical record from [facility] has been reviewed by the rehabilitation admission coordinator and physician.  Past Medical History      Past Medical History:  Diagnosis Date  . BPH (benign prostatic hyperplasia) 08/17/2020  . CAD (coronary artery  disease) 08/17/2020  . COPD (chronic obstructive pulmonary disease) ([place]) 08/17/2020   gold  . HTN (hypertension) 08/17/2020    Family History   family history is not on file.  Prior Rehab/Hospitalizations Has the patient had prior rehab or hospitalizations prior to admission? No  Has the patient had major surgery during 100 days prior to admission? Yes             Current Medications  Current Facility-Administered Medications:  .  0.9 %  sodium chloride infusion, 250 mL, Intravenous, Continuous, Norm Parcel, PA-C, Stopped at 09/11/20 1600 .  acetaminophen (TYLENOL) 160 MG/5ML solution 650 mg, 650 mg, Per Tube, Q6H PRN, 650 mg at 09/24/20 1832 **OR** acetaminophen (TYLENOL) suppository 650 mg, 650 mg, Rectal, Q6H PRN **OR** acetaminophen (TYLENOL) tablet 650 mg, 650 mg, Per Tube, Q6H PRN, Georganna Skeans, MD, 650 mg at 10/09/20 1144 .  albuterol (PROVENTIL) (2.5 MG/3ML) 0.083% nebulizer solution 2.5 mg, 2.5 mg, Nebulization, Q6H PRN, Norm Parcel, PA-C, 2.5 mg at 09/16/20 0023 .  artificial tears (LACRILUBE) ophthalmic ointment, , Both Eyes, Q4H PRN, Norm Parcel, PA-C, 1 application at 02/23/11 1020 .  aspirin chewable tablet 81 mg, 81 mg, Per Tube, Daily, Georganna Skeans, MD, 81 mg at 10/09/20 0946 .  bethanechol (URECHOLINE) tablet 50 mg, 50 mg, Per Tube, TID, Norm Parcel, PA-C, 50 mg at 10/09/20 0947 .  buPROPion (WELLBUTRIN XL) 24 hr tablet 300 mg, 300 mg, Oral, Daily, Georganna Skeans, MD, 300 mg at 10/09/20 0947 .  busPIRone (BUSPAR) tablet 10 mg, 10 mg, Per Tube, TID, Norm Parcel, PA-C, 10 mg at 10/09/20 0946 .  Chlorhexidine Gluconate Cloth 2 % PADS 6 each, 6 each, Topical, Daily, Norm Parcel, PA-C, 6 each at 10/08/20 1975 .  clonazePAM (KLONOPIN) tablet 0.5 mg, 0.5 mg, Per Tube, Barrington Ellison, MD, 0.5 mg at 10/08/20 2131 .  diphenhydrAMINE (BENADRYL) capsule 25 mg, 25 mg, Per Tube, Q6H PRN, Georganna Skeans, MD, 25 mg at 10/06/20 1024 .   docusate (COLACE) 50 MG/5ML liquid 100 mg, 100 mg, Per Tube, Daily, [name], MD, 100 mg at 10/09/20 0944 .  enoxaparin (LOVENOX) injection 40 mg, 40 mg, Subcutaneous, Q12H, Lovick, Montel Culver, MD, 40 mg at 10/09/20 1144 .  feeding supplement (ENSURE ENLIVE / ENSURE PLUS) liquid 237 mL, 237 mL, Oral, TID BM, [name], MD, 237 mL at 10/09/20 0942 .  feeding supplement (PROSource TF) liquid 45 mL, 45 mL, Per Tube, BID, [name], MD, 45 mL at 10/09/20 0947 .  folic acid (FOLVITE) tablet 1 mg, 1 mg, Per Tube, Daily, Norm Parcel, PA-C, 1 mg at 10/09/20 0947 .  guaiFENesin-dextromethorphan (ROBITUSSIN DM) 100-10 MG/5ML syrup 15 mL, 15 mL, Per Tube, Q4H, Lovick, Montel Culver, MD, 15 mL at 10/09/20 1144 .  insulin aspart (novoLOG) injection 0-15 Units, 0-15 Units, Subcutaneous, Q4H, Norm Parcel, PA-C, 3 Units at 10/09/20 1143 .  ipratropium-albuterol (  DUONEB) 0.5-2.5 (3) MG/3ML nebulizer solution 3 mL, 3 mL, Nebulization, BID, Georganna Skeans, MD, 3 mL at 10/09/20 0809 .  LORazepam (ATIVAN) injection 1 mg, 1 mg, Intravenous, Q2H PRN, Kinsinger, Arta Bruce, MD, 1 mg at 09/28/20 2351 .  MEDLINE mouth rinse, 15 mL, Mouth Rinse, BID, [name], MD, 15 mL at 10/09/20 0948 .  metoprolol tartrate (LOPRESSOR) injection 5 mg, 5 mg, Intravenous, Q6H PRN, Norm Parcel, PA-C, 5 mg at 09/19/20 2105 .  metoprolol tartrate (LOPRESSOR) tablet 25 mg, 25 mg, Per Tube, Daily, Norm Parcel, PA-C, 25 mg at 10/09/20 0946 .  mometasone-formoterol (DULERA) 200-5 MCG/ACT inhaler 2 puff, 2 puff, Inhalation, BID, Norm Parcel, PA-C, 2 puff at 10/09/20 0809 .  montelukast (SINGULAIR) tablet 10 mg, 10 mg, Per Tube, QHS, Norm Parcel, PA-C, 10 mg at 10/08/20 2132 .  morphine 4 MG/ML injection 4 mg, 4 mg, Intravenous, Q3H PRN, [name], MD, 4 mg at 10/08/20 2131 .  multivitamin with minerals tablet 1 tablet, 1 tablet, Per Tube, Daily, Norm Parcel, PA-C, 1 tablet at 10/09/20  0946 .  ondansetron (ZOFRAN-ODT) disintegrating tablet 4 mg, 4 mg, Oral, Q6H PRN **OR** ondansetron (ZOFRAN) injection 4 mg, 4 mg, Intravenous, Q6H PRN, Norm Parcel, PA-C, 4 mg at 10/04/20 1350 .  oxyCODONE (Oxy IR/ROXICODONE) immediate release tablet 10 mg, 10 mg, Per Tube, Q4H PRN, Norm Parcel, PA-C, 10 mg at 10/06/20 1615 .  oxyCODONE (Oxy IR/ROXICODONE) immediate release tablet 5 mg, 5 mg, Per Tube, Q4H PRN, Norm Parcel, PA-C, 5 mg at 10/09/20 0946 .  pantoprazole sodium (PROTONIX) 40 mg/20 mL oral suspension 40 mg, 40 mg, Per Tube, Daily, Norm Parcel, PA-C, 40 mg at 10/09/20 0947 .  polyethylene glycol (MIRALAX / GLYCOLAX) packet 17 g, 17 g, Oral, Daily, [name], MD, 17 g at 10/09/20 0946 .  QUEtiapine (SEROQUEL) tablet 150 mg, 150 mg, Per Tube, BID, Amedeo Plenty, RPH, 150 mg at 10/09/20 0946 .  QUEtiapine (SEROQUEL) tablet 300 mg, 300 mg, Per Tube, QHS, Amedeo Plenty, RPH, 300 mg at 10/08/20 2131 .  scopolamine (TRANSDERM-SCOP) 1 MG/3DAYS 1.5 mg, 1 patch, Transdermal, Q72H, Georganna Skeans, MD, 1.5 mg at 10/07/20 1243 .  tamsulosin (FLOMAX) capsule 0.4 mg, 0.4 mg, Oral, Daily, [name], MD, 0.4 mg at 10/09/20 0946 .  terazosin (HYTRIN) capsule 2 mg, 2 mg, Per Tube, Daily, Norm Parcel, PA-C, 2 mg at 10/09/20 0947 .  thiamine tablet 100 mg, 100 mg, Per Tube, Daily, Norm Parcel, PA-C, 100 mg at 10/09/20 8675  Patients Current Diet:     Diet Order                  DIET DYS 2 Room service appropriate? No; Fluid consistency: Thin  Diet effective now                  Precautions / Restrictions Precautions Precautions: Cervical,Back Precaution Booklet Issued: Yes (comment) Precaution Comments: trach collar, TLSO done with rolling if HOB >30,  abdominal binder to protect PEG, c-collar d/c'ed 5/24 Cervical Brace: Hard collar,At all times Spinal Brace: Thoracolumbosacral orthotic,Applied in supine  position Restrictions Weight Bearing Restrictions: No   Has the patient had 2 or more falls or a fall with injury in the past year? Yes, the fall that led to this admission  Prior Activity Level Community (5-7x/wk): independent prior to admission without DME, not driving at baseline (no license),  but no limitations in functional mobility/ADLs  Prior Functional Level Self Care: Did the patient need help bathing, dressing, using the toilet or eating? Independent  Indoor Mobility: Did the patient need assistance with walking from room to room (with or without device)? Independent  Stairs: Did the patient need assistance with internal or external stairs (with or without device)? Independent  Functional Cognition: Did the patient need help planning regular tasks such as shopping or remembering to take medications? Independent  Home Assistive Devices / Equipment  Prior Device Use: Indicate devices/aids used by the patient prior to current illness, exacerbation or injury? None of the above  Current Functional Level Cognition  Arousal/Alertness: Lethargic Overall Cognitive Status: Impaired/Different from baseline Difficult to assess due to: Tracheostomy Current Attention Level: Sustained Orientation Level: Oriented X4 Following Commands: Follows multi-step commands inconsistently,Follows multi-step commands with increased time,Follows one step commands consistently,Follows one step commands with increased time Safety/Judgement: Decreased awareness of safety,Decreased awareness of deficits General Comments: Needs cues for monitoring fatigue, very motivated Attention: Sustained Sustained Attention: Impaired Sustained Attention Impairment: Verbal basic,Functional basic Memory:  (to be further assessed) Awareness: Impaired Awareness Impairment: Intellectual impairment,Emergent impairment,Anticipatory impairment Problem Solving: Impaired (will assess for severity level) Behaviors:  Restless Safety/Judgment: Impaired [employer] Scales of Cognitive Functioning: Automatic/appropriate    Extremity Assessment (includes Sensation/Coordination)  Upper Extremity Assessment: Generalized weakness RUE Deficits / Details: shoulder AROM Flexion ~50*; abduction ~40*; pROM WFL.  Elbow distally at least 3+/5 RUE Coordination: decreased fine motor,decreased gross motor LUE Deficits / Details: AROM WFL.  At least 4-/5 LUE Coordination: decreased gross motor,decreased fine motor  Lower Extremity Assessment: Defer to PT evaluation RLE Deficits / Details: Edema noted in feet; limited PROM ankle dorsiflexion and knee flexion; unable to formally assess strength as pt not following commands this date, but spontaneous movements/resistance noted especially with PROM knee flexion and ankle dorsiflexion; pt does wiggle toes to cues on occasion but unable to lift legs off bed to 'kick'; bil clonus noted LLE Deficits / Details: Edema noted in feet; limited PROM ankle dorsiflexion and knee flexion; unable to formally assess strength as pt not following commands this date, but spontaneous movements/resistance noted especially with PROM knee flexion and ankle dorsiflexion; pt does wiggle toes to cues on occasion but unable to lift legs off bed to 'kick'; bil clonus noted    ADLs  Overall ADL's : Needs assistance/impaired Eating/Feeding: Set up,Sitting Grooming: Wash/dry face,Minimal assistance,Sitting Grooming Details (indicate cue type and reason): terminates task quickly Upper Body Bathing Details (indicate cue type and reason): RN and OT working to address the posterior aspect of cervical collar for hygiene. pt noted to have area of pressure on head and skin appears to have a craddle like cap appearance Lower Body Bathing: Total assistance,Bed level Lower Body Bathing Details (indicate cue type and reason): Total A for donning socks Upper Body Dressing : Maximal assistance,Bed level Upper  Body Dressing Details (indicate cue type and reason): total (A) to don brace Lower Body Dressing: Total assistance Lower Body Dressing Details (indicate cue type and reason): don socks Toilet Transfer: Moderate assistance,Stand-pivot,[name],RW Toilet Transfer Details (indicate cue type and reason): hand ove r hand to place hands Toileting- Clothing Manipulation and Hygiene: Minimal assistance,Sit to/from stand Toileting - Clothing Manipulation Details (indicate cue type and reason): pt with (A) to for steady doing peri care. pt with decreased saturations with attempt. OT finishing task max (A) . Functional mobility during ADLs: Minimal assistance,+2 for physical assistance,+2 for safety/equipment,Rolling walker General  ADL Comments: pt required > 4 minutes to rebound to 90% after static standing with peri care. pt positioned in recliner    Mobility  Overal bed mobility: Needs Assistance Bed Mobility: Rolling,Sit to Sidelying Rolling: Supervision Sidelying to sit: Min assist Supine to sit: Mod assist Sit to supine: Min assist Sit to sidelying: Supervision General bed mobility comments: Use of rail, HOB elevated, minA to execute bringing trunk to upright position    Transfers  Overall transfer level: Needs assistance Equipment used: Rolling walker (2 wheeled) Transfers: Sit to/from Stand Sit to Stand: Min assist General transfer comment: Light minA to steady upon ascent    Ambulation / Gait / Stairs / Emergency planning/management officer  Ambulation/Gait Ambulation/Gait assistance: Herbalist (Feet): 100 Feet Assistive device: Rolling walker (2 wheeled) Gait Pattern/deviations: Decreased stride length,Narrow base of support,Step-through pattern General Gait Details: Cues for walker proximity and activity pacing, requiring up to minA due to dynamic instability during turns Gait velocity: decreased Gait velocity interpretation: <1.31 ft/sec, indicative of household ambulator     Posture / Balance Dynamic Sitting Balance Sitting balance - Comments: can sit without UE support Balance Overall balance assessment: Needs assistance Sitting-balance support: No upper extremity supported,Feet supported Sitting balance-Leahy Scale: Fair Sitting balance - Comments: can sit without UE support Postural control: Right lateral lean Standing balance support: Single extremity supported,Bilateral upper extremity supported Standing balance-Leahy Scale: Poor Standing balance comment: reliant on UE support and minA at this time    Special needs/care consideration Oxygen 5L/28%, Trach size 6 cuffless and Diabetic management yes   Previous Home Environment (from acute therapy documentation)  Lives With: Other (Comment) (sister) Type of Home: House Additional Comments: Pt unable to provide info  Discharge Living Setting Plans for Discharge Living Setting: Lives with (comment) (sister: [name]) Type of Home at Discharge: House Discharge Home Layout: Two level,Able to live on main level with bedroom/bathroom Discharge Home Access: Stairs to enter Entrance Stairs-Rails: None Entrance Stairs-Number of Steps: 3 Discharge Bathroom Shower/Tub: Tub/shower unit Discharge Bathroom Toilet: Standard Discharge Bathroom Accessibility: Yes How Accessible: Accessible via walker (side stepping) Does the patient have any problems obtaining your medications?: No  Social/Family/Support Systems Anticipated Caregiver: sister, Mahalia Longest Anticipated Caregiver's Contact Information: [phone] Ability/Limitations of Caregiver: supervision only Caregiver Availability: 24/7 Discharge Plan Discussed with Primary Caregiver: Yes Is Caregiver In Agreement with Plan?: Yes Does Caregiver/Family have Issues with Lodging/Transportation while Pt is in Rehab?: No  Goals Patient/Family Goal for Rehab: PT/OT supervision to mod I, SLP mod I Expected length of stay: 10-14 days Pt/Family Agrees to  Admission and willing to participate: Yes Program Orientation Provided & Reviewed with Pt/Caregiver Including Roles  & Responsibilities: Yes  Barriers to Discharge: Insurance for SNF coverage  Decrease burden of Care through IP rehab admission: n/a  Possible need for SNF placement upon discharge: Not anticipated  Patient Condition: I have reviewed medical records from [facility], spoken with CM, and patient and family member. I met with patient at the bedside for inpatient rehabilitation assessment.  Patient will benefit from ongoing PT, OT and SLP, can actively participate in 3 hours of therapy a day 5 days of the week, and can make measurable gains during the admission.  Patient will also benefit from the coordinated team approach during an Inpatient Acute Rehabilitation admission.  The patient will receive intensive therapy as well as Rehabilitation physician, nursing, social worker, and care management interventions.  Due to bladder management, safety, skin/wound care, disease management, medication administration, pain management  and patient education the patient requires 24 hour a day rehabilitation nursing.  The patient is currently min assist with mobility and basic ADLs.  Discharge setting and therapy post discharge at home with home health is anticipated.  Patient has agreed to participate in the Acute Inpatient Rehabilitation Program and will admit today.  Preadmission Screen Completed By:  Michel Santee, PT, DPT 10/09/2020 12:11 PM ______________________________________________________________________   Discussed status with Dr. Dagoberto Ligas on 10/09/20  at 1:42 PM and received approval for admission today.  Admission Coordinator:  Michel Santee, PT, DPT time 1:42 PM Sudie Grumbling 10/09/20    Assessment/Plan: Diagnosis: 1. Does the need for close, 24 hr/day Medical supervision in concert with the patient's rehab needs make it unreasonable for this patient to be served in a less intensive  setting? Yes 2. Co-Morbidities requiring supervision/potential complications: anoxia vs TBI?; multiple rib fx's, COPD with Trach due to acute resp failure, Dysphagia on D2 diet; neurogenic bladder/BPH- needing I/o caths 3. Due to bladder management, bowel management, safety, skin/wound care, disease management, medication administration, pain management and patient education, does the patient require 24 hr/day rehab nursing? Yes 4. Does the patient require coordinated care of a physician, rehab nurse, PT, OT, and SLP to address physical and functional deficits in the context of the above medical diagnosis(es)? Yes Addressing deficits in the following areas: balance, endurance, locomotion, strength, transferring, bowel/bladder control, bathing, dressing, feeding, grooming, toileting, cognition and language 5. Can the patient actively participate in an intensive therapy program of at least 3 hrs of therapy 5 days a week? Yes 6. The potential for patient to make measurable gains while on inpatient rehab is good 7. Anticipated functional outcomes upon discharge from inpatient rehab: modified independent, supervision and min assist PT, modified independent, supervision and min assist OT, supervision SLP 8. Estimated rehab length of stay to reach the above functional goals is: 12-16 days 9. Anticipated discharge destination: Home 10. Overall Rehab/Functional Prognosis: good   MD Signature:

## 2020-10-09 NOTE — PMR Pre-admission (Signed)
PMR Admission Coordinator Pre-Admission Assessment  Patient: Charles Weeks is an 55 y.o., male MRN: [id number] DOB: 1965-08-17 Height: '5\' 8"'  (172.7 cm) Weight: 80.5 kg  Insurance Information HMO:     PPO:      PCP:      IPA:      80/20:      OTHER:  PRIMARY: [employer] Medicaid      Policy#: [id number]      Subscriber: pt CM Name: Marjorie Smolder      Phone#: [phone]     Fax#: [phone] Pre-Cert#: [id number] auth for CIR provided via fax from [place] with updates due to fax listed above on 5/30.      Employer:  Benefits:  Phone #: [phone]     Name:  Eff. Date: 11/17/19     Deduct: $0      Out of Pocket Max: $0      Life Max: n/a CIR: 100%      SNF:  Outpatient:      Co-Pay:  Home Health:       Co-Pay:  DME:      Co-Pay:  Providers:  SECONDARY:       Policy#:      Phone#:   Development worker, community:       Phone#:  [employer] Engineer, [employer]" for patients in Inpatient Rehabilitation Facilities with attached "Privacy Act [place] Records" was provided and verbally reviewed with: N/A  Emergency Contact Information Contact Information    Name Relation Home Work [place], [place] Daughter   [place], [place] Daughter   [phone]   Mah[place] Longest Sister   [phone]      Current Medical History  Patient Admitting Diagnosis: debility/polytrauma  History of Present Illness: Charles Weeks is a 55 year old right-handed male with history of COPD followed by Dr. Melvyn Novas, CAD/MI with DES placement in RCA January 2020 maintained on aspirin, hypertension, depression/anxiety, BPH, alcohol use as well as tobacco.  Presented 08/17/2020 after a fall down approximately 11 stairs and was found by his sister.  Denied loss of consciousness.  Noted to be hypoxic in the 70s placed on nonrebreather mask.  Cranial CT scan negative for acute changes.  CT cervical spine showed mildly displaced acute fractures of the right C7 transverse process extending to involve  the transverse foramen.  CT of the chest abdomen pelvis showed a large right pneumothorax with small amount of hemorrhage within the pleural space.  3 column fracture of the T8 vertebral body.  No retropulsion.  Fractures through the neural arch of T7.  Multiple right transverse process fractures involving the T4-T8 vertebral bodies.  Multiple bilateral rib fractures at the costovertebral junction from T5-T8 on the right and T7 and T8 on the left.  No evidence of solid organ injury.  CT angiogram of the neck showed no acute vascular injury to the neck.  Patient did sustain complex laceration of the scalp with repair.  Follow-up neurosurgery Dr. Ronnald Ramp in regards to 3 column T8 vertebral body fracture nonoperative placed in brace.  Right C7 transverse process fracture through foramen initially with cervical collar that was discontinued 5/23.  Hospital course prolonged intubation, and vent wean, undergoing tracheostomy as well as gastrostomy tube placement 09/03/2020 per Dr. Grandville Silos.  Lurline Idol downsized to a 4 cuffless on 5/19 but changed back to a #6 cuffless 10/05/2020 due to secretions.  He was cleared for Lovenox for DVT prophylaxis as well as being cleared to continue home aspirin.  Acute blood loss  anemia 10.1 and monitored.  Leukocytosis 19,200 improved to 13,500.  Bouts of urinary retention placed on Urecholine as well as Hytrin/Flomax.  Current dysphagia #2 thin liquid diet.  Palliative care consulted to establish goals of care.  Initial bouts of agitation or restlessness maintained on Seroquel.  Therapy evaluations completed due to patient decreased functional mobility was admitted for a comprehensive rehab program.    Patient's medical record from [facility] has been reviewed by the rehabilitation admission coordinator and physician.  Past Medical History  Past Medical History:  Diagnosis Date  . BPH (benign prostatic hyperplasia) 08/17/2020  . CAD (coronary artery disease) 08/17/2020  . COPD (chronic  obstructive pulmonary disease) ([place]) 08/17/2020   gold  . HTN (hypertension) 08/17/2020    Family History   family history is not on file.  Prior Rehab/Hospitalizations Has the patient had prior rehab or hospitalizations prior to admission? No  Has the patient had major surgery during 100 days prior to admission? Yes   Current Medications  Current Facility-Administered Medications:  .  0.9 %  sodium chloride infusion, 250 mL, Intravenous, Continuous, Norm Parcel, PA-C, Stopped at 09/11/20 1600 .  acetaminophen (TYLENOL) 160 MG/5ML solution 650 mg, 650 mg, Per Tube, Q6H PRN, 650 mg at 09/24/20 1832 **OR** acetaminophen (TYLENOL) suppository 650 mg, 650 mg, Rectal, Q6H PRN **OR** acetaminophen (TYLENOL) tablet 650 mg, 650 mg, Per Tube, Q6H PRN, Georganna Skeans, MD, 650 mg at 10/09/20 1144 .  albuterol (PROVENTIL) (2.5 MG/3ML) 0.083% nebulizer solution 2.5 mg, 2.5 mg, Nebulization, Q6H PRN, Norm Parcel, PA-C, 2.5 mg at 09/16/20 0023 .  artificial tears (LACRILUBE) ophthalmic ointment, , Both Eyes, Q4H PRN, Norm Parcel, PA-C, 1 application at 96/75/91 1020 .  aspirin chewable tablet 81 mg, 81 mg, Per Tube, Daily, Georganna Skeans, MD, 81 mg at 10/09/20 0946 .  bethanechol (URECHOLINE) tablet 50 mg, 50 mg, Per Tube, TID, Norm Parcel, PA-C, 50 mg at 10/09/20 0947 .  buPROPion (WELLBUTRIN XL) 24 hr tablet 300 mg, 300 mg, Oral, Daily, Georganna Skeans, MD, 300 mg at 10/09/20 0947 .  busPIRone (BUSPAR) tablet 10 mg, 10 mg, Per Tube, TID, Norm Parcel, PA-C, 10 mg at 10/09/20 0946 .  Chlorhexidine Gluconate Cloth 2 % PADS 6 each, 6 each, Topical, Daily, Norm Parcel, PA-C, 6 each at 10/08/20 6384 .  clonazePAM (KLONOPIN) tablet 0.5 mg, 0.5 mg, Per Tube, Barrington Ellison, MD, 0.5 mg at 10/08/20 2131 .  diphenhydrAMINE (BENADRYL) capsule 25 mg, 25 mg, Per Tube, Q6H PRN, Georganna Skeans, MD, 25 mg at 10/06/20 1024 .  docusate (COLACE) 50 MG/5ML liquid 100 mg, 100 mg, Per  Tube, Daily, [name], MD, 100 mg at 10/09/20 0944 .  enoxaparin (LOVENOX) injection 40 mg, 40 mg, Subcutaneous, Q12H, Lovick, Montel Culver, MD, 40 mg at 10/09/20 1144 .  feeding supplement (ENSURE ENLIVE / ENSURE PLUS) liquid 237 mL, 237 mL, Oral, TID BM, [name], MD, 237 mL at 10/09/20 0942 .  feeding supplement (PROSource TF) liquid 45 mL, 45 mL, Per Tube, BID, [name], MD, 45 mL at 10/09/20 0947 .  folic acid (FOLVITE) tablet 1 mg, 1 mg, Per Tube, Daily, Norm Parcel, PA-C, 1 mg at 10/09/20 0947 .  guaiFENesin-dextromethorphan (ROBITUSSIN DM) 100-10 MG/5ML syrup 15 mL, 15 mL, Per Tube, Q4H, Lovick, Montel Culver, MD, 15 mL at 10/09/20 1144 .  insulin aspart (novoLOG) injection 0-15 Units, 0-15 Units, Subcutaneous, Q4H, Johnson, [employer], 3 Units  at 10/09/20 1143 .  ipratropium-albuterol (DUONEB) 0.5-2.5 (3) MG/3ML nebulizer solution 3 mL, 3 mL, Nebulization, BID, Georganna Skeans, MD, 3 mL at 10/09/20 0809 .  LORazepam (ATIVAN) injection 1 mg, 1 mg, Intravenous, Q2H PRN, Kinsinger, Arta Bruce, MD, 1 mg at 09/28/20 2351 .  MEDLINE mouth rinse, 15 mL, Mouth Rinse, BID, [name], MD, 15 mL at 10/09/20 0948 .  metoprolol tartrate (LOPRESSOR) injection 5 mg, 5 mg, Intravenous, Q6H PRN, Norm Parcel, PA-C, 5 mg at 09/19/20 2105 .  metoprolol tartrate (LOPRESSOR) tablet 25 mg, 25 mg, Per Tube, Daily, Norm Parcel, PA-C, 25 mg at 10/09/20 0946 .  mometasone-formoterol (DULERA) 200-5 MCG/ACT inhaler 2 puff, 2 puff, Inhalation, BID, Norm Parcel, PA-C, 2 puff at 10/09/20 0809 .  montelukast (SINGULAIR) tablet 10 mg, 10 mg, Per Tube, QHS, Norm Parcel, PA-C, 10 mg at 10/08/20 2132 .  morphine 4 MG/ML injection 4 mg, 4 mg, Intravenous, Q3H PRN, [name], MD, 4 mg at 10/08/20 2131 .  multivitamin with minerals tablet 1 tablet, 1 tablet, Per Tube, Daily, Norm Parcel, PA-C, 1 tablet at 10/09/20 0946 .  ondansetron (ZOFRAN-ODT) disintegrating tablet 4  mg, 4 mg, Oral, Q6H PRN **OR** ondansetron (ZOFRAN) injection 4 mg, 4 mg, Intravenous, Q6H PRN, Norm Parcel, PA-C, 4 mg at 10/04/20 1350 .  oxyCODONE (Oxy IR/ROXICODONE) immediate release tablet 10 mg, 10 mg, Per Tube, Q4H PRN, Norm Parcel, PA-C, 10 mg at 10/06/20 1615 .  oxyCODONE (Oxy IR/ROXICODONE) immediate release tablet 5 mg, 5 mg, Per Tube, Q4H PRN, Norm Parcel, PA-C, 5 mg at 10/09/20 0946 .  pantoprazole sodium (PROTONIX) 40 mg/20 mL oral suspension 40 mg, 40 mg, Per Tube, Daily, Norm Parcel, PA-C, 40 mg at 10/09/20 0947 .  polyethylene glycol (MIRALAX / GLYCOLAX) packet 17 g, 17 g, Oral, Daily, [name], MD, 17 g at 10/09/20 0946 .  QUEtiapine (SEROQUEL) tablet 150 mg, 150 mg, Per Tube, BID, Amedeo Plenty, RPH, 150 mg at 10/09/20 0946 .  QUEtiapine (SEROQUEL) tablet 300 mg, 300 mg, Per Tube, QHS, Amedeo Plenty, RPH, 300 mg at 10/08/20 2131 .  scopolamine (TRANSDERM-SCOP) 1 MG/3DAYS 1.5 mg, 1 patch, Transdermal, Q72H, Georganna Skeans, MD, 1.5 mg at 10/07/20 1243 .  tamsulosin (FLOMAX) capsule 0.4 mg, 0.4 mg, Oral, Daily, [name], MD, 0.4 mg at 10/09/20 0946 .  terazosin (HYTRIN) capsule 2 mg, 2 mg, Per Tube, Daily, Norm Parcel, PA-C, 2 mg at 10/09/20 0947 .  thiamine tablet 100 mg, 100 mg, Per Tube, Daily, Norm Parcel, PA-C, 100 mg at 10/09/20 0076  Patients Current Diet:  Diet Order            DIET DYS 2 Room service appropriate? No; Fluid consistency: Thin  Diet effective now                 Precautions / Restrictions Precautions Precautions: Cervical,Back Precaution Booklet Issued: Yes (comment) Precaution Comments: trach collar, TLSO done with rolling if HOB >30,  abdominal binder to protect PEG, c-collar d/c'ed 5/24 Cervical Brace: Hard collar,At all times Spinal Brace: Thoracolumbosacral orthotic,Applied in supine position Restrictions Weight Bearing Restrictions: No   Has the patient had 2 or more falls or a fall  with injury in the past year? Yes, the fall that led to this admission  Prior Activity Level Community (5-7x/wk): independent prior to admission without DME, not driving at baseline (no license), but no limitations in  functional mobility/ADLs  Prior Functional Level Self Care: Did the patient need help bathing, dressing, using the toilet or eating? Independent  Indoor Mobility: Did the patient need assistance with walking from room to room (with or without device)? Independent  Stairs: Did the patient need assistance with internal or external stairs (with or without device)? Independent  Functional Cognition: Did the patient need help planning regular tasks such as shopping or remembering to take medications? Independent  Home Assistive Devices / Equipment    Prior Device Use: Indicate devices/aids used by the patient prior to current illness, exacerbation or injury? None of the above  Current Functional Level Cognition  Arousal/Alertness: Lethargic Overall Cognitive Status: Impaired/Different from baseline Difficult to assess due to: Tracheostomy Current Attention Level: Sustained Orientation Level: Oriented X4 Following Commands: Follows multi-step commands inconsistently,Follows multi-step commands with increased time,Follows one step commands consistently,Follows one step commands with increased time Safety/Judgement: Decreased awareness of safety,Decreased awareness of deficits General Comments: Needs cues for monitoring fatigue, very motivated Attention: Sustained Sustained Attention: Impaired Sustained Attention Impairment: Verbal basic,Functional basic Memory:  (to be further assessed) Awareness: Impaired Awareness Impairment: Intellectual impairment,Emergent impairment,Anticipatory impairment Problem Solving: Impaired (will assess for severity level) Behaviors: Restless Safety/Judgment: Impaired [employer] Scales of Cognitive Functioning: Automatic/appropriate     Extremity Assessment (includes Sensation/Coordination)  Upper Extremity Assessment: Generalized weakness RUE Deficits / Details: shoulder AROM Flexion ~50*; abduction ~40*; pROM WFL.  Elbow distally at least 3+/5 RUE Coordination: decreased fine motor,decreased gross motor LUE Deficits / Details: AROM WFL.  At least 4-/5 LUE Coordination: decreased gross motor,decreased fine motor  Lower Extremity Assessment: Defer to PT evaluation RLE Deficits / Details: Edema noted in feet; limited PROM ankle dorsiflexion and knee flexion; unable to formally assess strength as pt not following commands this date, but spontaneous movements/resistance noted especially with PROM knee flexion and ankle dorsiflexion; pt does wiggle toes to cues on occasion but unable to lift legs off bed to 'kick'; bil clonus noted LLE Deficits / Details: Edema noted in feet; limited PROM ankle dorsiflexion and knee flexion; unable to formally assess strength as pt not following commands this date, but spontaneous movements/resistance noted especially with PROM knee flexion and ankle dorsiflexion; pt does wiggle toes to cues on occasion but unable to lift legs off bed to 'kick'; bil clonus noted    ADLs  Overall ADL's : Needs assistance/impaired Eating/Feeding: Set up,Sitting Grooming: Wash/dry face,Minimal assistance,Sitting Grooming Details (indicate cue type and reason): terminates task quickly Upper Body Bathing Details (indicate cue type and reason): RN and OT working to address the posterior aspect of cervical collar for hygiene. pt noted to have area of pressure on head and skin appears to have a craddle like cap appearance Lower Body Bathing: Total assistance,Bed level Lower Body Bathing Details (indicate cue type and reason): Total A for donning socks Upper Body Dressing : Maximal assistance,Bed level Upper Body Dressing Details (indicate cue type and reason): total (A) to don brace Lower Body Dressing: Total  assistance Lower Body Dressing Details (indicate cue type and reason): don socks Toilet Transfer: Moderate assistance,Stand-pivot,[name],RW Toilet Transfer Details (indicate cue type and reason): hand ove r hand to place hands Toileting- Clothing Manipulation and Hygiene: Minimal assistance,Sit to/from stand Toileting - Clothing Manipulation Details (indicate cue type and reason): pt with (A) to for steady doing peri care. pt with decreased saturations with attempt. OT finishing task max (A) . Functional mobility during ADLs: Minimal assistance,+2 for physical assistance,+2 for safety/equipment,Rolling walker General ADL Comments:  pt required > 4 minutes to rebound to 90% after static standing with peri care. pt positioned in recliner    Mobility  Overal bed mobility: Needs Assistance Bed Mobility: Rolling,Sit to Sidelying Rolling: Supervision Sidelying to sit: Min assist Supine to sit: Mod assist Sit to supine: Min assist Sit to sidelying: Supervision General bed mobility comments: Use of rail, HOB elevated, minA to execute bringing trunk to upright position    Transfers  Overall transfer level: Needs assistance Equipment used: Rolling walker (2 wheeled) Transfers: Sit to/from Stand Sit to Stand: Min assist General transfer comment: Light minA to steady upon ascent    Ambulation / Gait / Stairs / Emergency planning/management officer  Ambulation/Gait Ambulation/Gait assistance: Herbalist (Feet): 100 Feet Assistive device: Rolling walker (2 wheeled) Gait Pattern/deviations: Decreased stride length,Narrow base of support,Step-through pattern General Gait Details: Cues for walker proximity and activity pacing, requiring up to minA due to dynamic instability during turns Gait velocity: decreased Gait velocity interpretation: <1.31 ft/sec, indicative of household ambulator    Posture / Balance Dynamic Sitting Balance Sitting balance - Comments: can sit without UE support Balance Overall  balance assessment: Needs assistance Sitting-balance support: No upper extremity supported,Feet supported Sitting balance-Leahy Scale: Fair Sitting balance - Comments: can sit without UE support Postural control: Right lateral lean Standing balance support: Single extremity supported,Bilateral upper extremity supported Standing balance-Leahy Scale: Poor Standing balance comment: reliant on UE support and minA at this time    Special needs/care consideration Oxygen 5L/28%, Trach size 6 cuffless and Diabetic management yes   Previous Home Environment (from acute therapy documentation)  Lives With: Other (Comment) (sister) Type of Home: House Additional Comments: Pt unable to provide info  Discharge Living Setting Plans for Discharge Living Setting: Lives with (comment) (sister: [name]) Type of Home at Discharge: House Discharge Home Layout: Two level,Able to live on main level with bedroom/bathroom Discharge Home Access: Stairs to enter Entrance Stairs-Rails: None Entrance Stairs-Number of Steps: 3 Discharge Bathroom Shower/Tub: Tub/shower unit Discharge Bathroom Toilet: Standard Discharge Bathroom Accessibility: Yes How Accessible: Accessible via walker (side stepping) Does the patient have any problems obtaining your medications?: No  Social/Family/Support Systems Anticipated Caregiver: sister, Mahalia Longest Anticipated Caregiver's Contact Information: [phone] Ability/Limitations of Caregiver: supervision only Caregiver Availability: 24/7 Discharge Plan Discussed with Primary Caregiver: Yes Is Caregiver In Agreement with Plan?: Yes Does Caregiver/Family have Issues with Lodging/Transportation while Pt is in Rehab?: No  Goals Patient/Family Goal for Rehab: PT/OT supervision to mod I, SLP mod I Expected length of stay: 10-14 days Pt/Family Agrees to Admission and willing to participate: Yes Program Orientation Provided & Reviewed with Pt/Caregiver Including Roles  &  Responsibilities: Yes  Barriers to Discharge: Insurance for SNF coverage  Decrease burden of Care through IP rehab admission: n/a  Possible need for SNF placement upon discharge: Not anticipated  Patient Condition: I have reviewed medical records from [facility], spoken with CM, and patient and family member. I met with patient at the bedside for inpatient rehabilitation assessment.  Patient will benefit from ongoing PT, OT and SLP, can actively participate in 3 hours of therapy a day 5 days of the week, and can make measurable gains during the admission.  Patient will also benefit from the coordinated team approach during an Inpatient Acute Rehabilitation admission.  The patient will receive intensive therapy as well as Rehabilitation physician, nursing, social worker, and care management interventions.  Due to bladder management, safety, skin/wound care, disease management, medication administration, pain management and patient  education the patient requires 24 hour a day rehabilitation nursing.  The patient is currently min assist with mobility and basic ADLs.  Discharge setting and therapy post discharge at home with home health is anticipated.  Patient has agreed to participate in the Acute Inpatient Rehabilitation Program and will admit today.  Preadmission Screen Completed By:  Michel Santee, PT, DPT 10/09/2020 12:11 PM ______________________________________________________________________   Discussed status with Dr. Dagoberto Ligas on 10/09/20  at 1:42 PM and received approval for admission today.  Admission Coordinator:  Michel Santee, PT, DPT time 1:42 PM Sudie Grumbling 10/09/20    Assessment/Plan: Diagnosis: 1. Does the need for close, 24 hr/day Medical supervision in concert with the patient's rehab needs make it unreasonable for this patient to be served in a less intensive setting? Yes 2. Co-Morbidities requiring supervision/potential complications: anoxia vs TBI?; multiple rib fx's, COPD with  Trach due to acute resp failure, Dysphagia on D2 diet; neurogenic bladder/BPH- needing I/o caths 3. Due to bladder management, bowel management, safety, skin/wound care, disease management, medication administration, pain management and patient education, does the patient require 24 hr/day rehab nursing? Yes 4. Does the patient require coordinated care of a physician, rehab nurse, PT, OT, and SLP to address physical and functional deficits in the context of the above medical diagnosis(es)? Yes Addressing deficits in the following areas: balance, endurance, locomotion, strength, transferring, bowel/bladder control, bathing, dressing, feeding, grooming, toileting, cognition and language 5. Can the patient actively participate in an intensive therapy program of at least 3 hrs of therapy 5 days a week? Yes 6. The potential for patient to make measurable gains while on inpatient rehab is good 7. Anticipated functional outcomes upon discharge from inpatient rehab: modified independent, supervision and min assist PT, modified independent, supervision and min assist OT, supervision SLP 8. Estimated rehab length of stay to reach the above functional goals is: 12-16 days 9. Anticipated discharge destination: Home 10. Overall Rehab/Functional Prognosis: good   MD Signature:

## 2020-10-09 NOTE — Progress Notes (Signed)
BLE venous duplex has been completed.  Results can be found under chart review under CV PROC. 10/09/2020 6:53 PM Wilburt Messina RVT, RDMS

## 2020-10-09 NOTE — Progress Notes (Signed)
Inpatient Rehab Admissions Coordinator:   I have a bed available for this patient to admit to CIR today.  Leary Roca, PA-C, in agreement.  Will let pt/family and TOC team know.   Estill Dooms, PT, DPT Admissions Coordinator [phone] 10/09/20  1:38 PM

## 2020-10-09 NOTE — Progress Notes (Signed)
Cala Bradford NP with Dr. Yetta Barre gave V/O to remove neck brace. Patient had a very restful night. SPO2 stayed at 90-92% on RA throughout the night. Patient is in good sprits this morning. Will continue to monitor.

## 2020-10-09 NOTE — Progress Notes (Signed)
36 Days Post-Op  Subjective: CC: NAEO. On HTC since yesterday at 5pm. Tolerating dysphagia diet without n/v. I/O cath q6hrs per RN. Last BM 5/22. Received insurance auth for [employer]. Awaiting bed.   Objective: Vital signs in last 24 hours: Temp:  [97.7 F (36.5 C)-99.4 F (37.4 C)] 97.7 F (36.5 C) (05/24 0800) Pulse Rate:  [75-88] 79 (05/24 0809) Resp:  [10-18] 14 (05/24 0809) BP: (96-140)/(65-85) 140/81 (05/24 0809) SpO2:  [88 %-93 %] 91 % (05/24 0817) FiO2 (%):  [21 %-28 %] 28 % (05/24 0817) Last BM Date: 10/07/20  Intake/Output from previous day: 05/23 0701 - 05/24 0700 In: 800 [P.O.:750; NG/GT:50] Out: 1350 [Urine:1350] Intake/Output this shift: Total I/O In: -  Out: 500 [Urine:500]  PE: General: sitting up in chair, no acute distress HEENT/Neck: trach in place, clean and dry, on humidified trach collar  Resp: CTA b/l. normal work of breathing CVS: RRR GI: soft, NT, PEG present.  Extremities: in back brace. Calves soft without edema or tenderness.   Lab Results:  No results for input(s): WBC, HGB, HCT, PLT in the last 72 hours. BMET No results for input(s): NA, K, CL, CO2, GLUCOSE, BUN, CREATININE, CALCIUM in the last 72 hours. PT/INR No results for input(s): LABPROT, INR in the last 72 hours. CMP     Component Value Date/Time   NA 138 10/02/2020 0200   K 4.2 10/02/2020 0200   CL 97 (L) 10/02/2020 0200   CO2 35 (H) 10/02/2020 0200   GLUCOSE 155 (H) 10/02/2020 0200   BUN 21 (H) 10/02/2020 0200   CREATININE 0.79 10/02/2020 0200   CALCIUM 9.4 10/02/2020 0200   PROT 6.6 09/24/2020 0942   ALBUMIN 3.1 (L) 09/24/2020 0942   AST 23 09/24/2020 0942   ALT 15 09/24/2020 0942   ALKPHOS 87 09/24/2020 0942   BILITOT 0.7 09/24/2020 0942   GFRNONAA >60 10/02/2020 0200   Lipase  No results found for: LIPASE     Studies/Results: DG Cervical Spine With Flex & Extend  Result Date: 10/07/2020 CLINICAL DATA:  Fall EXAM: CERVICAL SPINE COMPLETE WITH FLEXION AND  EXTENSION VIEWS COMPARISON:  09/11/2020 FINDINGS: Limited study no instability with flexion or extension. Slight anterolisthesis of C3 on C4 likely did to facet disease. Prevertebral soft tissues are normal. No visible fracture through the C6 level. C7 not visualized due to overlying shoulders. IMPRESSION: No visible instability. Limited lateral view study. C7 not visualized due to overlying shoulders. Electronically Signed   By: Charlett Nose M.D.   On: 10/07/2020 14:19   DG Thoracic Spine 2 View  Result Date: 10/08/2020 CLINICAL DATA:  T8 fracture. EXAM: THORACIC SPINE 2 VIEWS COMPARISON:  08/25/2020 and CT thoracic spine 09/11/2020. FINDINGS: Dextroconvex curvature of the thoracic spine. T8 through T12 compression fractures are poorly visualized. Cervicothoracic junction is obscured by the patient's shoulders. Small bilateral pleural effusions on the lateral view. Percutaneous gastrostomy. IMPRESSION: 1. T8 through T12 compression deformities are poorly visualized. 2. Small bilateral pleural effusions. Electronically Signed   By: Leanna Battles M.D.   On: 10/08/2020 14:30   CT THORACIC SPINE WO CONTRAST  Result Date: 10/08/2020 CLINICAL DATA:  Follow-up examination for spine fracture. EXAM: CT THORACIC SPINE WITHOUT CONTRAST TECHNIQUE: Multidetector CT images of the thoracic were obtained using the standard protocol without intravenous contrast. COMPARISON:  Prior CT from 09/11/2020. FINDINGS: Alignment: Exaggeration of the normal thoracic kyphosis with focal kyphotic angulation at T8-9, increased from previous. No listhesis. Vertebrae: There has been continued interval collapse  of previously identified comminuted burst type compression fracture involving the T8 vertebral body. Associated height loss now measures up to approximately 50% with no more than trace 2 mm bony retropulsion. Mild asymmetric widening of the right T8-9 facet, similar to previous. Height loss about the previously identified  comminuted a T9 compression fracture is relatively stable. Osseous remodeling with persistent nonunion about these fractures. Additional compression fractures involving the superior aspects of T10 and T11 again seen. No more than mild central height loss without bony retropulsion, similar to previous. Probable additional subtle evolving compression fracture noted involving the superior endplate of T12 with no more than minimal central height loss. Additional probable subacute compression fracture involving the right aspect of the superior endplate of T7 with no more than mild height loss. These were likely present on previous exam. Vertebral body height otherwise maintained with no other acute or interval fracture. Subacute mildly displaced fractures involving the T6, T7, and T8 spinous process is again noted. Subacute fractures of the right posteromedial third through eleventh ribs. Additional subacute healing fractures of the left posterior seventh through ninth ribs. Additional subacute fractures of the right third through tenth transverse process fractures again noted. Paraspinal and other soft tissues: Mild paraspinous edema noted about the subacute healing fractures, most pronounced at T8-9. Moderate layering bilateral pleural effusions. Tracheostomy tube in place. Underlying emphysema. Disc levels: Multilevel degenerative disc desiccation without high-grade spinal stenosis. IMPRESSION: 1. Partial interval collapse of previously identified comminuted burst type compression fracture involving the T8 vertebral body, now with up to approximately 50% height loss and trace 2 mm bony retropulsion. 2. Stable height loss about the additional compression fractures involving the T7, T9, T10, T11, and likely T12 vertebral bodies. 3. Subacute mildly displaced fractures involving the T6, T7, and T8 spinous processes. 4. Subacute healing fractures of the right posteromedial third through eleventh ribs, left posterior  seventh through ninth ribs, and right third through tenth transverse processes. 5. Moderate layering bilateral pleural effusions with underlying emphysema. Emphysema (ICD10-J43.9). Electronically Signed   By: Rise Mu M.D.   On: 10/08/2020 23:44   DG Cerv Spine Flex&Ext Only  Result Date: 10/08/2020 CLINICAL DATA:  History of thoracic spine fractures due to a fall 09/11/2020. EXAM: CERVICAL SPINE - FLEXION AND EXTENSION VIEWS ONLY COMPARISON:  Flexion and extension views of the cervical spine 10/07/2020. FINDINGS: No fracture is identified. Range of motion appears limited but no pathologic motion is identified. Multilevel cervical spondylosis is worst at C4-5 and C5-6. Tracheostomy tube noted. IMPRESSION: No acute abnormality.  Negative for pathologic motion. Degenerative disc disease. Electronically Signed   By: Drusilla Kanner M.D.   On: 10/08/2020 14:30    Anti-infectives: Anti-infectives (From admission, onward)   Start     Dose/Rate Route Frequency Ordered Stop   08/19/20 0845  cefTRIAXone (ROCEPHIN) 2 g in sodium chloride 0.9 % 100 mL IVPB  Status:  Discontinued        2 g 200 mL/hr over 30 Minutes Intravenous Every 24 hours 08/19/20 0753 08/25/20 1304   08/17/20 1515  ceFAZolin (ANCEF) IVPB 2g/100 mL premix        2 g 200 mL/hr over 30 Minutes Intravenous  Once 08/17/20 1500 08/17/20 1530       Assessment/Plan Fall from height  R PTX, B effusion Multiple B rib fxs- these are at the junction of the thoracic vertebral bodies, pain control 3 column T8 vertebral body fx-perDr. Jones,non-op now, plan mobilize in brace. Brace for HOB>30 degrees. Repeat  CT done yesterday. Await updated recs.  T7 neural arch fx- per nsgy R TVP T4-8 - pain control, per nsgy R C7 TVP fx through foramen- CTAnegative. Was in C-Collar. Flex/ex 5/23 negative. NSGY okay with d/c c-collar Large scalp laceration/right eyebrow laceration- repair by Dr. Lovick4/05/2020, sutures removed Right  neck abrasion/skin tear- bacitracin BID Facial ecchymosis- CT maxillofacialdemonstrates no facial bone fractures. Some anterior subluxation of the right mandibular condyle HTN- home lopressor 25mg  daily CAD/H/O MI/DES- on home ASA Acute hypercarbic ventilator dependent respiratory failure/COPD-Trach/PEG 4/18by Dr. 03-05-1974. HTC 24h, Trach changed back to 6 cuffless 5/20 due to secretions. On HTC Rash on back- benadryl Depression/anxiety- home bupropionandbuspar BPH- home terazosin, repeatedly failedvoiding trials,bethanachol. Home flomax as ordered now that he is taking PO. Cont I&O q6h ABL anemia- stable FEN-Dysphagia 2 diet, Ensure Enlive between meals, prosource BID VTE-LMWH ID - None currently  Dispo- medically stable for discharge to CIR.HTC 24/7   LOS: 53 days    6/20 , [facility] 10/09/2020, 11:03 AM Please see Amion for pager number during day hours 7:00am-4:30pm

## 2020-10-10 DIAGNOSIS — R338 Other retention of urine: Secondary | ICD-10-CM | POA: Diagnosis not present

## 2020-10-10 DIAGNOSIS — S069X3S Unspecified intracranial injury with loss of consciousness of 1 hour to 5 hours 59 minutes, sequela: Secondary | ICD-10-CM

## 2020-10-10 DIAGNOSIS — S22008S Other fracture of unspecified thoracic vertebra, sequela: Secondary | ICD-10-CM

## 2020-10-10 DIAGNOSIS — Z930 Tracheostomy status: Secondary | ICD-10-CM | POA: Diagnosis not present

## 2020-10-10 DIAGNOSIS — N401 Enlarged prostate with lower urinary tract symptoms: Secondary | ICD-10-CM | POA: Diagnosis not present

## 2020-10-10 DIAGNOSIS — R1312 Dysphagia, oropharyngeal phase: Secondary | ICD-10-CM

## 2020-10-10 LAB — CBC WITH DIFFERENTIAL/PLATELET
Abs Immature Granulocytes: 0.02 10*3/uL (ref 0.00–0.07)
Basophils Absolute: 0 10*3/uL (ref 0.0–0.1)
Basophils Relative: 1 %
Eosinophils Absolute: 0.8 10*3/uL — ABNORMAL HIGH (ref 0.0–0.5)
Eosinophils Relative: 11 %
HCT: 38.2 % — ABNORMAL LOW (ref 39.0–52.0)
Hemoglobin: 12 g/dL — ABNORMAL LOW (ref 13.0–17.0)
Immature Granulocytes: 0 %
Lymphocytes Relative: 21 %
Lymphs Abs: 1.6 10*3/uL (ref 0.7–4.0)
MCH: 28.5 pg (ref 26.0–34.0)
MCHC: 31.4 g/dL (ref 30.0–36.0)
MCV: 90.7 fL (ref 80.0–100.0)
Monocytes Absolute: 0.7 10*3/uL (ref 0.1–1.0)
Monocytes Relative: 9 %
Neutro Abs: 4.3 10*3/uL (ref 1.7–7.7)
Neutrophils Relative %: 58 %
Platelets: 193 10*3/uL (ref 150–400)
RBC: 4.21 MIL/uL — ABNORMAL LOW (ref 4.22–5.81)
RDW: 17.2 % — ABNORMAL HIGH (ref 11.5–15.5)
WBC: 7.4 10*3/uL (ref 4.0–10.5)
nRBC: 0.4 % — ABNORMAL HIGH (ref 0.0–0.2)

## 2020-10-10 LAB — COMPREHENSIVE METABOLIC PANEL
ALT: 27 U/L (ref 0–44)
AST: 20 U/L (ref 15–41)
Albumin: 3.4 g/dL — ABNORMAL LOW (ref 3.5–5.0)
Alkaline Phosphatase: 105 U/L (ref 38–126)
Anion gap: 9 (ref 5–15)
BUN: 17 mg/dL (ref 6–20)
CO2: 30 mmol/L (ref 22–32)
Calcium: 9.4 mg/dL (ref 8.9–10.3)
Chloride: 100 mmol/L (ref 98–111)
Creatinine, Ser: 0.85 mg/dL (ref 0.61–1.24)
GFR, Estimated: >60 mL/min (ref >60)
Glucose, Bld: 119 mg/dL — ABNORMAL HIGH (ref 70–99)
Potassium: 3.9 mmol/L (ref 3.5–5.1)
Sodium: 139 mmol/L (ref 135–145)
Total Bilirubin: 0.7 mg/dL (ref 0.3–1.2)
Total Protein: 6.9 g/dL (ref 6.5–8.1)

## 2020-10-10 LAB — GLUCOSE, CAPILLARY
Glucose-Capillary: 113 mg/dL — ABNORMAL HIGH (ref 70–99)
Glucose-Capillary: 115 mg/dL — ABNORMAL HIGH (ref 70–99)
Glucose-Capillary: 121 mg/dL — ABNORMAL HIGH (ref 70–99)
Glucose-Capillary: 141 mg/dL — ABNORMAL HIGH (ref 70–99)
Glucose-Capillary: 150 mg/dL — ABNORMAL HIGH (ref 70–99)
Glucose-Capillary: 188 mg/dL — ABNORMAL HIGH (ref 70–99)

## 2020-10-10 MED ORDER — JEVITY 1.5 CAL/FIBER PO LIQD
1000.0000 mL | ORAL | Status: DC
  Administered 2020-10-10 – 2020-10-13 (×4): 1000 mL
  Filled 2020-10-10 (×5): qty 1000

## 2020-10-10 MED ORDER — SENNOSIDES 8.8 MG/5ML PO SYRP
5.0000 mL | ORAL_SOLUTION | Freq: Two times a day (BID) | ORAL | Status: DC
  Administered 2020-10-10 – 2020-10-30 (×40): 5 mL via ORAL
  Filled 2020-10-10 (×41): qty 5

## 2020-10-10 MED ORDER — SORBITOL 70 % SOLN
60.0000 mL | Status: AC
  Administered 2020-10-10: 60 mL via ORAL

## 2020-10-10 NOTE — Progress Notes (Signed)
Inpatient Rehabilitation  Patient information reviewed and entered into eRehab system by Cathy Ropp M. Letrell Attwood, M.A., CCC/SLP, PPS Coordinator.  Information including medical coding, functional ability and quality indicators will be reviewed and updated through discharge.    

## 2020-10-10 NOTE — Evaluation (Signed)
Speech Language Pathology Assessment and Plan  Patient Details  Name: Charles Weeks MRN: [id number] Date of Birth: [birth year]  SLP Diagnosis: Speech and Language deficits;Cognitive Impairments;Voice disorder;Dysphagia  Rehab Potential: Good ELOS: 2 weeks    Today's Date: 10/10/2020 SLP Individual Time: 1300-1400 SLP Individual Time Calculation (min): 60 min   Hospital Problem: Principal Problem:   Thoracic spine fracture ([place]) Active Problems:   Urinary retention due to benign prostatic hyperplasia   Tracheostomy in place [facility])  Past Medical History:  Past Medical History:  Diagnosis Date  . Anxiety   . Anxiety 01/19/2020  . Arthritis   . Benign prostatic hyperplasia with weak urinary stream 12/08/2019  . BPH (benign prostatic hyperplasia) 08/17/2020  . CAD (coronary artery disease) 08/17/2020  . Chronic respiratory failure with hypoxia ([place]) 01/24/2020   Has home 02 but not using as of 01/24/2020  -  01/24/2020   Walked RA  approx   200 ft  @ slow pace  stopped due to  Dizzy with sats still 95%     . Cigarette smoker 01/24/2020   Stopped regular smoking 10/2019 but still "now and then" as of 01/24/2020   . COPD (chronic obstructive pulmonary disease) ([place])   . COPD (chronic obstructive pulmonary disease) ([place]) 08/17/2020   gold  . COPD GOLD ?     Quit smoking 10/2019  - Labs ordered 01/24/2020  :  allergy profile   alpha one AT phenotype   - 01/24/2020  After extensive coaching inhaler device,  effectiveness =    75% (short Ti) > change symb 80 to 160 2bid     . Coronary artery disease 06/08/2018   DES to RCA, [facility] [place] [place])  . Depression   . Depression, major, single episode, moderate ([place]) 01/19/2020  . Encounter for screening for malignant neoplasm of colon 12/08/2019  . Essential hypertension   . Essential hypertension 12/08/2019  . History of MI (myocardial infarction) 01/19/2020  . HTN (hypertension) 08/17/2020  . Myocardial infarct ([place])   . Myocardial infarct  ([place])   . Respiratory failure with hypoxia [facility])    Past Surgical History:  Past Surgical History:  Procedure Laterality Date  . BOWEL RESECTION  2006  . PEG PLACEMENT N/A 09/03/2020   Procedure: PERCUTANEOUS ENDOSCOPIC GASTROSTOMY (PEG) PLACEMENT;  Surgeon: Georganna Skeans, MD;  Location: [place];  Service: General;  Laterality: N/A;  . PERCUTANEOUS TRACHEOSTOMY N/A 09/03/2020   Procedure: PERCUTANEOUS TRACHEOSTOMY USING 6 SHILEY;  Surgeon: Georganna Skeans, MD;  Location: [place];  Service: General;  Laterality: N/A;  . SKIN GRAFT    . TONSILLECTOMY AND ADENOIDECTOMY      Assessment / Plan / Recommendation  Patient is a 55 year old right-handed male with history of COPD followed by Dr. Melvyn Novas, CAD/MI with DES placement in RCA January 2020 maintained on aspirin, hypertension, depression/anxiety, BPH, alcohol use as well as tobacco.Per chart review patient lives with his sister. Independent prior to admission. Two-level home bedroom downstairs.Presented 08/17/2020 after a fall down approximately 11 stairs and was found by his sister. Denied loss of consciousness. Noted to be hypoxic in the 70s placed on nonrebreather mask. Cranial CT scan negative for acute changes. CT cervical spine showed mildly displaced acute fractures of the right C7 transverse process extending to involve the transverse foramen. CT of the chest abdomen pelvis showed a large right pneumothorax with small amount of hemorrhage within the pleural space. 3 column fracture of the T8 vertebral body. No retropulsion. Fractures through the neural arch  of T7. Multiple right transverse process fractures involving the T4-T8 vertebral bodies. Multiple bilateral rib fractures at the costovertebral junction from T5-T8 on the right and T7 and T8 on the left. No evidence of solid organ injury. CT angiogram of the neck showed no acute vascular injury to the neck. Patient did sustain complex laceration of the scalp with repair.  Follow-up neurosurgery Dr. Ronnald Ramp in regards to 3 column T8 vertebral body fracture nonoperative placed in brace. Right C7 transverse process fracture through foramen initially with cervical collar that was later discontinued. Hospital course prolonged intubation undergoing tracheostomy as well as gastrostomy tube placement 09/03/2020 per Dr. Grandville Silos. Trach changed to a #6 cuffless 10/05/2020 due to secretions. He was cleared Lovenox for DVT prophylaxis as well as being cleared to continue home aspirin. Acute blood loss anemia 10.1 and monitored. Leukocytosis 19,200 improved to 13,500. Bouts of urinary retention placed on Urecholine as well as Hytrin/Flomax. Current dysphagia #2 thin liquid diet. Palliative care consulted to establish goals of care. Initial bouts of agitation or restlessness maintained on Seroquel.  Patient transferred to CIR on 10/09/2020 .   Clinical Impression Patient presents with a mild cognitive impairment, mild speech and voice impairment and mild oropharyngeal dysphagia. Patient was oriented x 4 but required cues for initiating responses at times, cues to elaborate upon responses and would frequently appear initially confused when asked a question. 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 Patient able to maintain adequate phonation with PMV in place and SpO2 saturations in low 90%'s with his voice quality hoarse and low vocal intensity. Speech intelligibility is impaired, however intelligiblity at 75% conversational level. Patient tolerating Dys 2 solids, thin liquids diet without overt s/s aspiration or penetration and plans for SLP to trial Dys 3 solids during upcoming therapy session. Patient currently requires SLP intervention due to voice, speech, swallow and cognitive disorders.  Skilled Therapeutic Interventions          BSE, SLE, PMV evaluations  SLP  Assessment  Patient will need skilled Speech Lanaguage Pathology Services during CIR admission    Recommendations  Patient may use Passy-Muir Speech Valve: During all waking hours (remove during sleep) PMSV Supervision: Intermittent MD: Please consider changing trach tube to : Smaller size SLP Diet Recommendations: Dysphagia 2 (Fine chop);Thin Liquid Administration via: Cup;Straw Medication Administration: Whole meds with puree Supervision: Patient able to self feed;Staff to assist with self feeding;Full supervision/cueing for compensatory strategies Compensations: Minimize environmental distractions;Slow rate;Small sips/bites;Lingual sweep for clearance of pocketing Postural Changes and/or Swallow Maneuvers: Seated upright 90 degrees Oral Care Recommendations: Oral care BID Recommendations for Other Services: Neuropsych consult Patient destination: Home Follow up Recommendations: Home Health SLP;24 hour supervision/assistance Equipment Recommended: None recommended by SLP    SLP Frequency 3 to 5 out of 7 days   SLP Duration  SLP Intensity  SLP Treatment/Interventions 2 weeks  Minumum of 1-2 x/day, 30 to 90 minutes  Cognitive remediation/compensation;Dysphagia/aspiration precaution training;Medication managment;Speech/Language facilitation;Patient/family education;Functional tasks    Pain Pain Assessment Pain Scale: 0-10 Pain Score: 0-No pain Pain Type: Acute pain Pain Location: Shoulder Pain Orientation: Right Pain Descriptors / Indicators: Aching Pain Onset: On-going Patients Stated Pain Goal: 2 Pain Intervention(s): Ambulation/increased activity;Repositioned  Prior Functioning Cognitive/Linguistic Baseline: Within functional limits Type of Home: House  Lives With: Other (Comment) (sister) Available Help at Discharge: Family;Available PRN/intermittently Education: 7th grade ("I  cant read or write") Vocation: On disability  SLP Evaluation Cognition Overall  Cognitive Status: Impaired/Different from baseline Arousal/Alertness: Awake/alert Orientation Level: Oriented X4 Attention: Sustained Sustained Attention: Impaired Sustained Attention Impairment: Verbal complex;Functional basic Memory: Impaired Memory Impairment: Retrieval deficit Immediate Memory Recall: Sock;Bed;Blue Memory Recall Sock: With Cue Memory Recall Blue: With Cue Memory Recall Bed: Not able to recall Awareness: Impaired Awareness Impairment: Emergent impairment Problem Solving: Impaired Problem Solving Impairment: Verbal complex Safety/Judgment: Impaired [employer] Scales of Cognitive Functioning: Automatic/appropriate  Comprehension Auditory Comprehension Overall Auditory Comprehension: Appears within functional limits for tasks assessed Expression Expression Primary Mode of Expression: Verbal Verbal Expression Overall Verbal Expression: Impaired Initiation: Impaired Level of Generative/Spontaneous Verbalization: Phrase;Sentence Repetition: No impairment Naming: No impairment Pragmatics: Impairment Impairments: Abnormal affect Interfering Components: Attention Non-Verbal Means of Communication: Not applicable Written Expression Dominant Hand: Left Oral Motor Oral Motor/Sensory Function Overall Oral Motor/Sensory Function: Generalized oral weakness Motor Speech Overall Motor Speech: Impaired Respiration: Impaired Level of Impairment: Phrase Phonation: Hoarse;Low vocal intensity Resonance: Within functional limits Articulation: Impaired Level of Impairment: Phrase Intelligibility: Intelligibility reduced Word: 75-100% accurate Phrase: 75-100% accurate Sentence: 75-100% accurate Conversation: 75-100% accurate Motor Planning: Witnin functional limits Motor Speech Errors: Not applicable  Care Tool Care Tool Cognition Expression of Ideas and Wants Expression of Ideas and Wants: Some difficulty - exhibits some difficulty with expressing needs and  ideas (e.g, some words or finishing thoughts) or speech is not clear   Understanding Verbal and Non-Verbal Content Understanding Verbal and Non-Verbal Content: Usually understands - understands most conversations, but misses some part/intent of message. Requires cues at times to understand   Memory/Recall Ability *first 3 days only Memory/Recall Ability *first 3 days only: Current season;Location of own room;That he or she is in a hospital/hospital unit     PMSV Assessment  PMSV Trial PMSV was placed for: 60 minutes Able to redirect subglottic air through upper airway: Yes Able to Attain Phonation: Yes Voice Quality: Hoarse;Low vocal intensity Able to Expectorate Secretions: No Breath Support for Phonation: Mildly decreased Intelligibility: Intelligibility reduced Word: 75-100% accurate Phrase: 75-100% accurate Sentence: 75-100% accurate Conversation: 75-100% accurate Respirations During Trial: 20 SpO2 During Trial: 91 % Behavior: Alert;Cooperative;Responsive to questions  Bedside Swallowing Assessment General Date of Onset: 08/17/20 Previous Swallow Assessment: BSE 5/11 Diet Prior to this Study: Dysphagia 2 (chopped);Thin liquids Temperature Spikes Noted: No Respiratory Status: Trach Trach Size and Type: #6;With PMSV in place;Uncuffed History of Recent Intubation: Yes Length of Intubations (days):  (16 days) Date extubated:  (trach 4/18) Behavior/Cognition: Alert;Cooperative;Pleasant mood Oral Cavity - Dentition: Missing dentition Self-Feeding Abilities: Needs set up Patient Positioning: Upright in bed Baseline Vocal Quality: Low vocal intensity;Hoarse Volitional Cough: Strong Volitional Swallow: Able to elicit  Oral Care Assessment   Ice Chips  NT Thin Liquid Thin Liquid: Within functional limits Presentation: Straw Nectar Thick  NT Honey Thick  NT Puree Puree: Not tested Solid Solid: Not tested BSE Assessment Risk for Aspiration Impact on safety and  function: Mild aspiration risk Other Related Risk Factors: Cognitive impairment;Tracheostomy  Short Term Goals: Week 1: SLP Short Term Goal 1 (Week 1): Patient will consume Dys 3 solids trials without significant changes in vitals (oxygen saturations, etc) and without overt s/s aspiration or penetration. SLP Short Term Goal 2 (Week 1): Patient will perform basic level medication and money management tasks with minA cues. SLP Short Term Goal 3 (Week 1): Patient will demonstrate awareness to deficits and impact on his function during simulated tasks with minA. SLP Short  Term Goal 4 (Week 1): Patient will demonstrate basic level alternating attention during functional tasks with min-modA cues. SLP Short Term Goal 5 (Week 1): Patient will maintain adequate vocal intensity and speech intelligibility (at 85% intelligible) during semi-structured conversations, with min-modA cues.  Refer to Care Plan for Long Term Goals  Recommendations for other services: Neuropsych  Discharge Criteria: Patient will be discharged from SLP if patient refuses treatment 3 consecutive times without medical reason, if treatment goals not met, if there is a change in medical status, if patient makes no progress towards goals or if patient is discharged from hospital.  The above assessment, treatment plan, treatment alternatives and goals were discussed and mutually agreed upon: by patient  Sonia Baller, MA, CCC-SLP Speech Therapy

## 2020-10-10 NOTE — Progress Notes (Signed)
Inpatient Rehabilitation Medication Review by a Pharmacist  A complete drug regimen review was completed for this patient to identify any potential clinically significant medication issues.  Clinically significant medication issues were identified:  yes  Check AMION for pharmacist assigned to patient if future medication questions/issues arise during this admission.  Pharmacist comments:   Bupropion (Wellbutrin) XL 300 mg PO daily.   Other medications are ordered to give tube.   Patient is on Dysphagia 2 diet and RN reports he is able to eat.  I have instructed RN that bupropion XL can not be crushed, halved,  not per tube; must be taken whole. RN will notify pharmacist if patient not able to take the bupropion XL orally.    Time spent performing this drug regimen review (minutes):  10 minutes   Noah Delaine, [place] Clinical Pharmacist 10/10/2020 9:28 AM Please check AMION for all [facility] phone numbers After 10:00 PM, call Main Pharmacy [phone]

## 2020-10-10 NOTE — Evaluation (Signed)
Occupational Therapy Assessment and Plan  Patient Details  Name: Charles Weeks MRN: [id number] Date of Birth: Apr 18, 1966  OT Diagnosis: acute pain, cognitive deficits and muscle weakness (generalized) Rehab Potential: Rehab Potential (ACUTE ONLY): Good ELOS: 2 weeks   Today's Date: 10/10/2020 OT Individual Time: 1400-1455 OT Individual Time Calculation (min): 55 min     Hospital Problem: Principal Problem:   Thoracic spine fracture (HCC) Active Problems:   Urinary retention due to benign prostatic hyperplasia   Tracheostomy in [facility])   Past Medical History:  Past Medical History:  Diagnosis Date  . Anxiety   . Anxiety 01/19/2020  . Arthritis   . Benign prostatic hyperplasia with weak urinary stream 12/08/2019  . BPH (benign prostatic hyperplasia) 08/17/2020  . CAD (coronary artery disease) 08/17/2020  . Chronic respiratory failure with hypoxia ([place]) 01/24/2020   Has home 02 but not using as of 01/24/2020  -  01/24/2020   Walked RA  approx   200 ft  @ slow pace  stopped due to  Dizzy with sats still 95%     . Cigarette smoker 01/24/2020   Stopped regular smoking 10/2019 but still "now and then" as of 01/24/2020   . COPD (chronic obstructive pulmonary disease) ([place])   . COPD (chronic obstructive pulmonary disease) ([place]) 08/17/2020   gold  . COPD GOLD ?     Quit smoking 10/2019  - Labs ordered 01/24/2020  :  allergy profile   alpha one AT phenotype   - 01/24/2020  After extensive coaching inhaler device,  effectiveness =    75% (short Ti) > change symb 80 to 160 2bid     . Coronary artery disease 06/08/2018   DES to RCA, [facility] [place] [place])  . Depression   . Depression, major, single episode, moderate ([place]) 01/19/2020  . Encounter for screening for malignant neoplasm of colon 12/08/2019  . Essential hypertension   . Essential hypertension 12/08/2019  . History of MI (myocardial infarction) 01/19/2020  . HTN (hypertension) 08/17/2020  . Myocardial infarct ([place])   .  Myocardial infarct ([place])   . Respiratory failure with hypoxia [facility])    Past Surgical History:  Past Surgical History:  Procedure Laterality Date  . BOWEL RESECTION  2006  . PEG PLACEMENT N/A 09/03/2020   Procedure: PERCUTANEOUS ENDOSCOPIC GASTROSTOMY (PEG) PLACEMENT;  Surgeon: Georganna Skeans, MD;  Location: [place];  Service: General;  Laterality: N/A;  . PERCUTANEOUS TRACHEOSTOMY N/A 09/03/2020   Procedure: PERCUTANEOUS TRACHEOSTOMY USING 6 SHILEY;  Surgeon: Georganna Skeans, MD;  Location: [place];  Service: General;  Laterality: N/A;  . SKIN GRAFT    . TONSILLECTOMY AND ADENOIDECTOMY      Assessment & Plan Clinical Impression: [name] is a 55 year old right-handed male with history of COPD followed by Dr. Melvyn Novas, CAD/MI with DES placement in RCA January 2020 maintained on aspirin, hypertension, depression/anxiety, BPH, alcohol use as well as tobacco.Per chart review patient lives with his sister. Independent prior to admission. Two-level home bedroom downstairs.Presented 08/17/2020 after a fall down approximately 11 stairs and was found by his sister. Denied loss of consciousness. Noted to be hypoxic in the 70s placed on nonrebreather mask. Cranial CT scan negative for acute changes. CT cervical spine showed mildly displaced acute fractures of the right C7 transverse process extending to involve the transverse foramen. CT of the chest abdomen pelvis showed a large right pneumothorax with small amount of hemorrhage within the pleural space. 3 column fracture of the T8 vertebral body. No  retropulsion. Fractures through the neural arch of T7. Multiple right transverse process fractures involving the T4-T8 vertebral bodies. Multiple bilateral rib fractures at the costovertebral junction from T5-T8 on the right and T7 and T8 on the left. No evidence of solid organ injury. CT angiogram of the neck showed no acute vascular injury to the neck. Patient did sustain complex laceration of the  scalp with repair. Follow-up neurosurgery Dr. Ronnald Ramp in regards to 3 column T8 vertebral body fracture nonoperative placed in brace. Right C7 transverse process fracture through foramen initially with cervical collar that was later discontinued. Hospital course prolonged intubation undergoing tracheostomy as well as gastrostomy tube placement 09/03/2020 per Dr. Grandville Silos. Trach changed to a #6 cuffless 10/05/2020 due to secretions. He was cleared Lovenox for DVT prophylaxis as well as being cleared to continue home aspirin. Acute blood loss anemia 10.1 and monitored. Leukocytosis 19,200 improved to 13,500. Bouts of urinary retention placed on Urecholine as well as Hytrin/Flomax. Current dysphagia #2 thin liquid diet. Palliative care consulted to establish goals of care. Initial bouts of agitation or restlessness maintained on Seroquel. Therapy evaluations completed due to patient decreased functional mobility was admitted for a comprehensive rehab program..  Patient transferred to CIR on 10/09/2020 .    Patient currently requires mod with basic self-care skills secondary to muscle weakness, decreased cardiorespiratoy endurance and decreased oxygen support, decreased awareness, decreased problem solving, decreased safety awareness and delayed processing and decreased standing balance, decreased postural control and decreased balance strategies.  Prior to hospitalization, patient could complete ADLs with independent .  Patient will benefit from skilled intervention to decrease level of assist with basic self-care skills prior to discharge home with care partner.  Anticipate patient will require 24 hour supervision and follow up home health.  OT - End of Session Activity Tolerance: Tolerates 10 - 20 min activity with multiple rests Endurance Deficit: Yes Endurance Deficit Description: generalized weakness OT Assessment Rehab Potential (ACUTE ONLY): Good OT Barriers to Discharge: Decreased caregiver  support OT Barriers to Discharge Comments: unclear if family is home 24/7 OT Patient demonstrates impairments in the following area(s): Balance;Cognition;Endurance;Motor;Pain;Safety OT Basic ADL's Functional Problem(s): Bathing;Dressing;Toileting OT Transfers Functional Problem(s): Toilet;Tub/Shower OT Additional Impairment(s): Fuctional Use of Upper Extremity OT Plan OT Intensity: Minimum of 1-2 x/day, 45 to 90 minutes OT Frequency: 5 out of 7 days OT Duration/Estimated Length of Stay: 2 weeks OT Treatment/Interventions: Balance/vestibular training;DME/adaptive equipment instruction;Patient/family education;Therapeutic Activities;Therapeutic Exercise;Psychosocial support;Cognitive remediation/compensation;Community reintegration;Functional mobility training;Self Care/advanced ADL retraining;UE/LE Strength taining/ROM;UE/LE Coordination activities;Discharge planning;Neuromuscular re-education;Pain management OT Self Feeding Anticipated Outcome(s): no goal set OT Basic Self-Care Anticipated Outcome(s): supervision OT Toileting Anticipated Outcome(s): supervision OT Bathroom Transfers Anticipated Outcome(s): supervision OT Recommendation Recommendations for Other Services: Neuropsych consult Patient destination: Home Follow Up Recommendations: Home health OT Equipment Recommended: To be determined   OT Evaluation Precautions/Restrictions  Precautions Precautions: Cervical;Back Precaution Booklet Issued: Yes (comment) Precaution Comments: trach collar, TLSO don with rolling if HOB >30,  abdominal binder to protect PEG, c-collar d/c'ed 5/24 Required Braces or Orthoses: Spinal Brace Cervical Brace: Hard collar;At all times Spinal Brace: Thoracolumbosacral orthotic;Applied in supine position Restrictions Weight Bearing Restrictions: No General Chart Reviewed: Yes Family/Caregiver Present: No Pain Pain Assessment Pain Scale: 0-10 Pain Score: 6  Pain Type: Acute pain Pain Location:  Shoulder Pain Orientation: Right Pain Descriptors / Indicators: Aching Pain Onset: On-going Pain Intervention(s): Ambulation/increased activity;Repositioned Home Living/Prior Functioning Home Living Family/patient expects to be discharged to:: Private residence Living Arrangements: Other relatives Available Help at Discharge: [facility] PRN/intermittently  Type of Home: House Home Access: Stairs to enter [employer] of Steps: 3 Entrance Stairs-Rails: None Home Layout: Two level,Able to live on main level with bedroom/bathroom Alternate Level Stairs-Number of Steps: Patient does not go upstairs Bathroom Shower/Tub: Chiropodist: Standard  Lives With: Other (Comment) IADL History Homemaking Responsibilities: Yes Meal Prep Responsibility: Secondary Laundry Responsibility: Secondary Cleaning Responsibility: Secondary Bill Paying/Finance Responsibility: Secondary Shopping Responsibility: Secondary Current License: Yes Mode of Transportation: Car Prior Function Level of Independence: Independent with basic ADLs,Independent with gait,Independent with homemaking with ambulation  Able to Take Stairs?: Yes Driving: No Vocation: On disability Vision Baseline Vision/History: No visual deficits Patient Visual Report: No change from baseline Vision Assessment?: No apparent visual deficits Perception  Perception: Within Functional Limits Praxis Praxis: Intact Cognition Overall Cognitive Status: Impaired/Different from baseline Arousal/Alertness: Awake/alert Orientation Level: Person;Place;Situation Person: Oriented Place: Oriented Situation: Oriented Year: 2022 Month: May Day of Week: Correct Memory: Impaired Memory Impairment: Decreased recall of new information Immediate Memory Recall: Sock;Bed;Blue Memory Recall Sock: With Cue Memory Recall Blue: With Cue Memory Recall Bed: Not able to recall Attention: Sustained Sustained Attention:  Impaired Sustained Attention Impairment: Verbal basic;Functional basic Awareness: Impaired Awareness Impairment: Emergent impairment Problem Solving: Impaired Safety/Judgment: Impaired [employer] Scales of Cognitive Functioning: Automatic/appropriate Sensation Sensation Light Touch: Appears Intact Hot/Cold: Appears Intact Coordination Gross Motor Movements are Fluid and Coordinated: No Coordination and Movement Description: generalized weakness Finger Nose Finger Test: [facility] Motor  Motor Motor: Other (comment) Motor - Skilled Clinical Observations: generalized weakness  Trunk/Postural Assessment  Cervical Assessment Cervical Assessment: Exceptions to [facility] (forward head) Thoracic Assessment Thoracic Assessment: Exceptions to [facility] (thoracic fx) Lumbar Assessment Lumbar Assessment: Exceptions to [facility] (posterior pelvic tilt) Postural Control Postural Control: Deficits on evaluation Righting Reactions: delayed  Balance Balance Balance Assessed: Yes Static Sitting Balance Static Sitting - Balance Support: No upper extremity supported Static Sitting - Level of Assistance: 5: Stand by assistance Dynamic Sitting Balance Dynamic Sitting - Balance Support: No upper extremity supported Dynamic Sitting - Level of Assistance: 5: Stand by assistance Static Standing Balance Static Standing - Balance Support: No upper extremity supported Static Standing - Level of Assistance: 4: Min assist Dynamic Standing Balance Dynamic Standing - Balance Support: No upper extremity supported Dynamic Standing - Level of Assistance: 3: Mod assist Extremity/Trunk Assessment RUE Assessment RUE Assessment: Exceptions to [facility] General Strength Comments: R shoulder flexion limited to 30 degrees. Elbow AROM WFL, hand normal LUE Assessment LUE Assessment: Within Functional Limits  Care Tool Care Tool Self Care Eating   Eating Assist Level: Supervision/Verbal cueing    Oral Care  Oral care, brush  teeth, clean dentures activity did not occur: Refused      Bathing   Body parts bathed by patient: Right arm;Left arm;Chest;Abdomen;Front perineal area;Buttocks;Right upper leg;Left upper leg;Face Body parts bathed by helper: Left lower leg;Right lower leg   Assist Level: Moderate Assistance - Patient 50 - 74%    Upper Body Dressing(including orthotics)   What is the patient wearing?: [place] only   Assist Level: Minimal Assistance - Patient > 75%    Lower Body Dressing (excluding footwear)   What is the patient wearing?: Hospital gown only      Putting on/Taking off footwear   What is the patient wearing?: Non-skid slipper socks Assist for footwear: Maximal Assistance - Patient 25 - 49%       Care Tool Toileting Toileting activity   Assist for toileting: Moderate Assistance - Patient 50 - 74%  Care Tool Bed Mobility Roll left and right activity   Roll left and right assist level: Contact Guard/Touching assist    Sit to lying activity   Sit to lying assist level: Contact Guard/Touching assist    Lying to sitting edge of bed activity   Lying to sitting edge of bed assist level: Contact Guard/Touching assist     Care Tool Transfers Sit to stand transfer   Sit to stand assist level: Minimal Assistance - Patient > 75%    Chair/bed transfer   Chair/bed transfer assist level: Moderate Assistance - Patient 50 - 74%     Toilet transfer   Assist Level: Moderate Assistance - Patient 50 - 74%     Care Tool Cognition Expression of Ideas and Wants Expression of Ideas and Wants: Some difficulty - exhibits some difficulty with expressing needs and ideas (e.g, some words or finishing thoughts) or speech is not clear   Understanding Verbal and Non-Verbal Content Understanding Verbal and Non-Verbal Content: Understands (complex and basic) - clear comprehension without cues or repetitions   Memory/Recall Ability *first 3 days only Memory/Recall Ability *first 3 days only:  Current season;That he or she is in a hospital/hospital unit    Refer to Care Plan for [place] 1 OT Short Term Goal 1 (Week 1): Pt will complete toileting tasks with min A OT Short Term Goal 2 (Week 1): Pt will transfer to toilet with min A OT Short Term Goal 3 (Week 1): Pt will instruct caregiver on donning TLSO with min cueing OT Short Term Goal 4 (Week 1): Pt will use LRAD to wash LB with min A  Recommendations for other services: Neuropsych   Skilled Therapeutic Intervention Pt supine with c/o pain 6/10 in his R shoulder. Pt demonstrated AROM limitations as detailed above in the R shoulder. TLSO donned supine by rolling R and L, max A to don. Pt fully oriented but with slow processing, awareness, and STM deficits. Pt declined any dressing tasks but bathed UB and LB and completed toileting as described above/below. Pt on RA throughout session with PMSV on. Pt SpO2 fluctuated between 88-91% throughout. Pt completed ambulatory transfer to bathroom with mod A at times, more like min overall but frequent balance perturbations with poor righting reactions, making him a high fall risk. Pt also with poor awareness/carryover of back precautions. Pt left supine with trach collar on 28% FiO2. Hob at 10 degrees so TLSO removed. Bed alarm set.   ADL ADL Eating: Supervision/safety Where Assessed-Eating: Bed level Grooming: Supervision/safety Where Assessed-Grooming: Bed level Upper Body Bathing: Minimal assistance Where Assessed-Upper Body Bathing: Bed level Lower Body Bathing: Moderate assistance Where Assessed-Lower Body Bathing: Edge of bed Upper Body Dressing: Not assessed Lower Body Dressing: Not assessed Toileting: Moderate assistance Where Assessed-Toileting: Glass blower/designer: Moderate assistance Toilet Transfer Method: Ambulating Tub/Shower Transfer: Unable to assess Social research officer, government: Minimal assistance Mobility  Bed Mobility Bed Mobility:  Rolling Right;Rolling Left;Supine to Sit;Sit to Supine Rolling Right: Contact Guard/Touching assist Rolling Left: Contact Guard/Touching assist Supine to Sit: Minimal Assistance - Patient > 75% Sit to Supine: Minimal Assistance - Patient > 75% Transfers Sit to Stand: Minimal Assistance - Patient > 75% Stand to Sit: Minimal Assistance - Patient > 75%   Discharge Criteria: Patient will be discharged from OT if patient refuses treatment 3 consecutive times without medical reason, if treatment goals not met, if there is a change in medical status, if patient makes no progress  towards goals or if patient is discharged from hospital.  The above assessment, treatment plan, treatment alternatives and goals were discussed and mutually agreed upon: by patient  Curtis Sites 10/10/2020, 3:07 PM

## 2020-10-10 NOTE — Progress Notes (Signed)
Patient ID: Charles Weeks, male   DOB: 07/14/1965, 54 y.o.   MRN: [id number] Met with the patient to review role of the Nurse CM Erline Levine) and initiate education. Discussed insomnia, multiple interruptions during the night and pain in right shoulder area and back pain.  Trach intact with PMSV on and tolerating well. Right UE weakness is a concern for the patient. Reviewed therapy schedule and explained team conference schedule and plan of care. Reviewed education along with education of sister/family caregivers on assistance needed for discharge as he goes along. Initial handouts left in the education notebook.Margarito Liner

## 2020-10-10 NOTE — Progress Notes (Signed)
PROGRESS NOTE   Subjective/Complaints: Pt says he didn't sleep that well. Can't specify why. Complains or right shoulder and back pain  ROS: Limited due to cognitive/behavioral    Objective:   DG Thoracic Spine 2 View  Result Date: 10/08/2020 CLINICAL DATA:  T8 fracture. EXAM: THORACIC SPINE 2 VIEWS COMPARISON:  08/25/2020 and CT thoracic spine 09/11/2020. FINDINGS: Dextroconvex curvature of the thoracic spine. T8 through T12 compression fractures are poorly visualized. Cervicothoracic junction is obscured by the patient's shoulders. Small bilateral pleural effusions on the lateral view. Percutaneous gastrostomy. IMPRESSION: 1. T8 through T12 compression deformities are poorly visualized. 2. Small bilateral pleural effusions. Electronically Signed   By: Leanna Battles M.D.   On: 10/08/2020 14:30   CT THORACIC SPINE WO CONTRAST  Result Date: 10/08/2020 CLINICAL DATA:  Follow-up examination for spine fracture. EXAM: CT THORACIC SPINE WITHOUT CONTRAST TECHNIQUE: Multidetector CT images of the thoracic were obtained using the standard protocol without intravenous contrast. COMPARISON:  Prior CT from 09/11/2020. FINDINGS: Alignment: Exaggeration of the normal thoracic kyphosis with focal kyphotic angulation at T8-9, increased from previous. No listhesis. Vertebrae: There has been continued interval collapse of previously identified comminuted burst type compression fracture involving the T8 vertebral body. Associated height loss now measures up to approximately 50% with no more than trace 2 mm bony retropulsion. Mild asymmetric widening of the right T8-9 facet, similar to previous. Height loss about the previously identified comminuted a T9 compression fracture is relatively stable. Osseous remodeling with persistent nonunion about these fractures. Additional compression fractures involving the superior aspects of T10 and T11 again seen. No more  than mild central height loss without bony retropulsion, similar to previous. Probable additional subtle evolving compression fracture noted involving the superior endplate of T12 with no more than minimal central height loss. Additional probable subacute compression fracture involving the right aspect of the superior endplate of T7 with no more than mild height loss. These were likely present on previous exam. Vertebral body height otherwise maintained with no other acute or interval fracture. Subacute mildly displaced fractures involving the T6, T7, and T8 spinous process is again noted. Subacute fractures of the right posteromedial third through eleventh ribs. Additional subacute healing fractures of the left posterior seventh through ninth ribs. Additional subacute fractures of the right third through tenth transverse process fractures again noted. Paraspinal and other soft tissues: Mild paraspinous edema noted about the subacute healing fractures, most pronounced at T8-9. Moderate layering bilateral pleural effusions. Tracheostomy tube in place. Underlying emphysema. Disc levels: Multilevel degenerative disc desiccation without high-grade spinal stenosis. IMPRESSION: 1. Partial interval collapse of previously identified comminuted burst type compression fracture involving the T8 vertebral body, now with up to approximately 50% height loss and trace 2 mm bony retropulsion. 2. Stable height loss about the additional compression fractures involving the T7, T9, T10, T11, and likely T12 vertebral bodies. 3. Subacute mildly displaced fractures involving the T6, T7, and T8 spinous processes. 4. Subacute healing fractures of the right posteromedial third through eleventh ribs, left posterior seventh through ninth ribs, and right third through tenth transverse processes. 5. Moderate layering bilateral pleural effusions with underlying emphysema. Emphysema (ICD10-J43.9). Electronically Signed   By:  Rise Mu  M.D.   On: 10/08/2020 23:44   DG Cerv Spine Flex&Ext Only  Result Date: 10/08/2020 CLINICAL DATA:  History of thoracic spine fractures due to a fall 09/11/2020. EXAM: CERVICAL SPINE - FLEXION AND EXTENSION VIEWS ONLY COMPARISON:  Flexion and extension views of the cervical spine 10/07/2020. FINDINGS: No fracture is identified. Range of motion appears limited but no pathologic motion is identified. Multilevel cervical spondylosis is worst at C4-5 and C5-6. Tracheostomy tube noted. IMPRESSION: No acute abnormality.  Negative for pathologic motion. Degenerative disc disease. Electronically Signed   By: Drusilla Kanner M.D.   On: 10/08/2020 14:30   VAS [place] LOWER EXTREMITY VENOUS (DVT)  Result Date: 10/10/2020  Lower Venous DVT Study Patient Name:  GWEN EDLER  Date of Exam:   10/09/2020 Medical Rec #: [id number]       Accession #:    [id number] Date of Birth: 1965-07-06      Patient Gender: M Patient Age:   68Y Exam Location:  [facility] Procedure:      VAS [place] LOWER EXTREMITY VENOUS (DVT) Referring Phys: [id number] DANIEL J ANGIULLI --------------------------------------------------------------------------------  Indications: Swelling per MD.  Risk Factors: Prolonged hospital stay - trauma with immobilization. Comparison Study: No previous exams. Performing Technologist: Ernestene Mention  Examination Guidelines: A complete evaluation includes B-mode imaging, spectral Doppler, color Doppler, and power Doppler as needed of all accessible portions of each vessel. Bilateral testing is considered an integral part of a complete examination. Limited examinations for reoccurring indications may be performed as noted. The reflux portion of the exam is performed with the patient in reverse Trendelenburg.  +---------+---------------+---------+-----------+----------+--------------+ RIGHT    CompressibilityPhasicitySpontaneityPropertiesThrombus Aging  +---------+---------------+---------+-----------+----------+--------------+ CFV      Full           Yes      Yes                                 +---------+---------------+---------+-----------+----------+--------------+ SFJ      Full                                                        +---------+---------------+---------+-----------+----------+--------------+ FV Prox  Full           Yes      Yes                                 +---------+---------------+---------+-----------+----------+--------------+ FV Mid   Full           Yes      Yes                                 +---------+---------------+---------+-----------+----------+--------------+ FV DistalFull           Yes      Yes                                 +---------+---------------+---------+-----------+----------+--------------+ PFV      Full                                                        +---------+---------------+---------+-----------+----------+--------------+  POP      Full           Yes      Yes                                 +---------+---------------+---------+-----------+----------+--------------+ PTV      Full                                                        +---------+---------------+---------+-----------+----------+--------------+ PERO     Full                                                        +---------+---------------+---------+-----------+----------+--------------+   +---------+---------------+---------+-----------+----------+--------------+ LEFT     CompressibilityPhasicitySpontaneityPropertiesThrombus Aging +---------+---------------+---------+-----------+----------+--------------+ CFV      Full           Yes      Yes                                 +---------+---------------+---------+-----------+----------+--------------+ SFJ      Full                                                         +---------+---------------+---------+-----------+----------+--------------+ FV Prox  Full           Yes      Yes                                 +---------+---------------+---------+-----------+----------+--------------+ FV Mid   Full           Yes      Yes                                 +---------+---------------+---------+-----------+----------+--------------+ FV DistalFull           Yes      Yes                                 +---------+---------------+---------+-----------+----------+--------------+ PFV      Full                                                        +---------+---------------+---------+-----------+----------+--------------+ POP      Full           Yes      Yes                                 +---------+---------------+---------+-----------+----------+--------------+ PTV  Full                                                        +---------+---------------+---------+-----------+----------+--------------+ PERO     Full                                                        +---------+---------------+---------+-----------+----------+--------------+     Summary: BILATERAL: - No evidence of deep vein thrombosis seen in the lower extremities, bilaterally. - No evidence of superficial venous thrombosis in the lower extremities, bilaterally. -No evidence of popliteal cyst, bilaterally.   *See table(s) above for measurements and observations. Electronically signed by Waverly Ferrari MD on 10/10/2020 at 6:53:27 AM.    Final    Recent Labs    10/09/20 1842 10/10/20 0507  WBC 10.5 7.4  HGB 12.0* 12.0*  HCT 37.9* 38.2*  PLT 320 193   Recent Labs    10/09/20 1842 10/10/20 0623  NA  --  139  K  --  3.9  CL  --  100  CO2  --  30  GLUCOSE  --  119*  BUN  --  17  CREATININE 0.80 0.85  CALCIUM  --  9.4    Intake/Output Summary (Last 24 hours) at 10/10/2020 0806 Last data filed at 10/10/2020 0315 Gross per 24 hour  Intake --   Output 900 ml  Net -900 ml        Physical Exam: Vital Signs Blood pressure 118/82, pulse 81, temperature 98.1 F (36.7 C), resp. rate 16, height 5\' 8"  (1.727 m), weight 80.5 kg, SpO2 92 %.  General: Alert and oriented x 2, No apparent distress HEENT: Head is normocephalic, atraumatic, PERRLA, EOMI, sclera anicteric, oral mucosa pink and moist, dentition intact, ext ear canals clear,  Neck: Trach with PMV. Phonates but low volume.  Heart: Reg rate and rhythm. No murmurs rubs or gallops Chest: CTA bilaterally without wheezes, rales, or rhonchi; no distress Abdomen: Soft, non-tender, non-distended, bowel sounds positive. PEG site clean Extremities: No clubbing, cyanosis, or edema. Pulses are 2+ Psych: Pt's affect is flat. Pt is cooperative Skin: Clean and intact without signs of breakdown Neuro: alert. Follows commands. Fair awareness. Delayed processing. UE's 3-4/5. LE 3-4/5. no focal sensory deficits. No resting tone Musculoskeletal: right shoulder and back TTP    Assessment/Plan: 1. Functional deficits which require 3+ hours per day of interdisciplinary therapy in a comprehensive inpatient rehab setting.  Physiatrist is providing close team supervision and 24 hour management of active medical problems listed below.  Physiatrist and rehab team continue to assess barriers to discharge/monitor patient progress toward functional and medical goals  Care Tool:  Bathing              Bathing assist       Upper Body Dressing/Undressing Upper body dressing        Upper body assist      Lower Body Dressing/Undressing Lower body dressing            Lower body assist       Toileting Toileting    Toileting assist Assist for toileting: Moderate Assistance - Patient 50 - 74%  Transfers Chair/bed transfer  Transfers assist     Chair/bed transfer assist level: Moderate Assistance - Patient 50 - 74%     Locomotion Ambulation   Ambulation assist               Walk 10 feet activity   Assist           Walk 50 feet activity   Assist           Walk 150 feet activity   Assist           Walk 10 feet on uneven surface  activity   Assist           Wheelchair     Assist               Wheelchair 50 feet with 2 turns activity    Assist            Wheelchair 150 feet activity     Assist          Blood pressure 118/82, pulse 81, temperature 98.1 F (36.7 C), resp. rate 16, height 5\' 8"  (1.727 m), weight 80.5 kg, SpO2 92 %.  Medical Problem List and Plan: 1.Debilitysecondary to fall with 3 column T8 vertebral body fracture/T7 neural arch fracture/right TVP T4-8/right C7 transverse process fracture through the foramen. TLSO back brace as advised applied in supine position -patient may shower.  -ELOS/Goals: ~2-2.5 weeks- supervision to min A  -Patient is beginning CIR therapies today including PT, OT, and SLP  2. Antithrombotics: -DVT/anticoagulation:Lovenox.    -vascular study negative -antiplatelet therapy: Aspirin 81 mg daily 3. Pain Management:Oxycodone as needed 4. Mood:Wellbutrin 300 mg daily, BuSpar 10 mg 3 times daily, Klonopin 0.5 mg nightly, -antipsychotic agents: Seroquel 150 mg twice daily and 300 mg nightly  -record sleep chart  -behavior non-agitated 5. Neuropsych: This patientis notcapable of making decisions on hisown behalf. 6. Skin/Wound Care:Routine skin checks 7. Fluids/Electrolytes/Nutrition:encourage PO, TF as below  -I personally reviewed all of the patient's labs today, and lab work is within normal limits.   8. Acute blood loss anemia. Follow-up CBC 9. Multiple bilateral rib fractures. Conservative care 10. 3: T8 vertebral body fracture. Follow-up neurosurgery Dr. . Nonoperative.   -Brace for HOB>30 degrees at this point 11. T7 neural arch fracture. Follow-up  neurosurgery 12. Right TVP T4-8. Pain control follow neurosurgery 13. Right C7 TVP fracture through foramen. CTA negative. Cervical collar discontinued. 14. Large scalp laceration right eyebrow laceration. Repaired 08/17/2020. Sutures removed 15. Hypertension. Lopressor 25 mg twice daily 16. CAD/MI/DES. Continue aspirin 17. Acute hypercarbic ventilatory dependent respiratory failure/COPD. Tracheostomy 09/03/2020 per Dr. 09/05/2020. Changed back to #6 cuffless 10/05/2020 due to secretions. Patient will follow up long-term with Dr. 10/07/2020  -tolerating PMV currently 18. Dysphagia. Gastrostomy tube 09/03/2020 per Dr. 09/05/2020.   -Dysphagia #2 thin liquids--advance as tolerated 19. BPH. Continue Hytrin/Flomax/Urecholine.   -still retaining: in/out caths q6 hours until he empties/voids.  -oob to void asap 20. History of alcohol tobacco abuse. Provide counseling 21. Constipation---sorbitol today  -scheduled senna-s    LOS: 1 days A FACE TO FACE EVALUATION WAS PERFORMED  Janee Morn 10/10/2020, 8:06 AM

## 2020-10-10 NOTE — Evaluation (Signed)
Physical Therapy Assessment and Plan  Patient Details  Name: Charles Weeks MRN: [id number] Date of Birth: 1965-07-06  PT Diagnosis: Difficulty walking, Impaired cognition and Muscle weakness Rehab Potential: Good ELOS: 2-2.5 weeks   Today's Date: 10/10/2020 PT Individual Time: 3729-0211 PT Individual Time Calculation (min): 55 min    Hospital Problem: Principal Problem:   Thoracic spine fracture [facility]) Active Problems:   Urinary retention due to benign prostatic hyperplasia   Tracheostomy in place [facility])   Past Medical History:  Past Medical History:  Diagnosis Date  . Anxiety   . Anxiety 01/19/2020  . Arthritis   . Benign prostatic hyperplasia with weak urinary stream 12/08/2019  . BPH (benign prostatic hyperplasia) 08/17/2020  . CAD (coronary artery disease) 08/17/2020  . Chronic respiratory failure with hypoxia ([place]) 01/24/2020   Has home 02 but not using as of 01/24/2020  -  01/24/2020   Walked RA  approx   200 ft  @ slow pace  stopped due to  Dizzy with sats still 95%     . Cigarette smoker 01/24/2020   Stopped regular smoking 10/2019 but still "now and then" as of 01/24/2020   . COPD (chronic obstructive pulmonary disease) ([place])   . COPD (chronic obstructive pulmonary disease) ([place]) 08/17/2020   gold  . COPD GOLD ?     Quit smoking 10/2019  - Labs ordered 01/24/2020  :  allergy profile   alpha one AT phenotype   - 01/24/2020  After extensive coaching inhaler device,  effectiveness =    75% (short Ti) > change symb 80 to 160 2bid     . Coronary artery disease 06/08/2018   DES to RCA, [facility] [place])  . Depression   . Depression, major, single episode, moderate ([place]) 01/19/2020  . Encounter for screening for malignant neoplasm of colon 12/08/2019  . Essential hypertension   . Essential hypertension 12/08/2019  . History of MI (myocardial infarction) 01/19/2020  . HTN (hypertension) 08/17/2020  . Myocardial infarct ([place])   . Myocardial infarct ([place])   . Respiratory  failure with hypoxia [facility])    Past Surgical History:  Past Surgical History:  Procedure Laterality Date  . BOWEL RESECTION  2006  . PEG PLACEMENT N/A 09/03/2020   Procedure: PERCUTANEOUS ENDOSCOPIC GASTROSTOMY (PEG) PLACEMENT;  Surgeon: Georganna Skeans, MD;  Location: [place];  Service: General;  Laterality: N/A;  . PERCUTANEOUS TRACHEOSTOMY N/A 09/03/2020   Procedure: PERCUTANEOUS TRACHEOSTOMY USING 6 SHILEY;  Surgeon: Georganna Skeans, MD;  Location: [place];  Service: General;  Laterality: N/A;  . SKIN GRAFT    . TONSILLECTOMY AND ADENOIDECTOMY      Assessment & Plan Clinical Impression: Patient is a 55 year old right-handed male with history of COPD followed by Dr. Melvyn Novas, CAD/MI with DES placement in RCA January 2020 maintained on aspirin, hypertension, depression/anxiety, BPH, alcohol use as well as tobacco.Per chart review patient lives with his sister. Independent prior to admission. Two-level home bedroom downstairs.Presented 08/17/2020 after a fall down approximately 11 stairs and was found by his sister. Denied loss of consciousness. Noted to be hypoxic in the 70s placed on nonrebreather mask. Cranial CT scan negative for acute changes. CT cervical spine showed mildly displaced acute fractures of the right C7 transverse process extending to involve the transverse foramen. CT of the chest abdomen pelvis showed a large right pneumothorax with small amount of hemorrhage within the pleural space. 3 column fracture of the T8 vertebral body. No retropulsion. Fractures through the neural arch of  T7. Multiple right transverse process fractures involving the T4-T8 vertebral bodies. Multiple bilateral rib fractures at the costovertebral junction from T5-T8 on the right and T7 and T8 on the left. No evidence of solid organ injury. CT angiogram of the neck showed no acute vascular injury to the neck. Patient did sustain complex laceration of the scalp with repair. Follow-up neurosurgery Dr.  Ronnald Ramp in regards to 3 column T8 vertebral body fracture nonoperative placed in brace. Right C7 transverse process fracture through foramen initially with cervical collar that was later discontinued. Hospital course prolonged intubation undergoing tracheostomy as well as gastrostomy tube placement 09/03/2020 per Dr. Grandville Silos. Trach changed to a #6 cuffless 10/05/2020 due to secretions. He was cleared Lovenox for DVT prophylaxis as well as being cleared to continue home aspirin. Acute blood loss anemia 10.1 and monitored. Leukocytosis 19,200 improved to 13,500. Bouts of urinary retention placed on Urecholine as well as Hytrin/Flomax. Current dysphagia #2 thin liquid diet. Palliative care consulted to establish goals of care. Initial bouts of agitation or restlessness maintained on Seroquel.  Patient transferred to CIR on 10/09/2020 .   Patient currently requires mod with mobility secondary to muscle weakness, decreased cardiorespiratoy endurance and decreased oxygen support, decreased safety awareness and decreased sitting balance, decreased standing balance, decreased postural control and decreased balance strategies.  Prior to hospitalization, patient was independent  with mobility and lived with Other (Comment) in a House home.  Home access is 3Stairs to enter.  Patient will benefit from skilled PT intervention to maximize safe functional mobility, minimize fall risk and decrease caregiver burden for planned discharge home with intermittent assist.  Anticipate patient will benefit from follow up [facility] at discharge.  PT - End of Session Activity Tolerance: Tolerates 10 - 20 min activity with multiple rests Endurance Deficit: Yes Endurance Deficit Description: generalized weakness PT Assessment Rehab Potential (ACUTE/IP ONLY): Good PT Patient demonstrates impairments in the following area(s): Balance;Behavior;Endurance;Motor;Pain;Perception;Safety PT Transfers Functional Problem(s): Bed Mobility;Bed  to Chair;Car;Furniture PT Locomotion Functional Problem(s): Ambulation;Stairs PT Plan PT Intensity: Minimum of 1-2 x/day ,45 to 90 minutes PT Frequency: 5 out of 7 days PT Duration Estimated Length of Stay: 2-2.5 weeks PT Treatment/Interventions: Ambulation/gait training;Community reintegration;DME/adaptive equipment instruction;Neuromuscular re-education;Psychosocial support;Stair training;UE/LE Strength taining/ROM;Wheelchair propulsion/positioning;Balance/vestibular training;Discharge planning;Functional electrical stimulation;Pain management;Skin care/wound management;Therapeutic Activities;UE/LE Coordination activities;Cognitive remediation/compensation;Disease management/prevention;Functional mobility training;Patient/family education;Splinting/orthotics;Therapeutic Exercise;Visual/perceptual remediation/compensation PT Transfers Anticipated Outcome(s): Supervision PT Locomotion Anticipated Outcome(s): Supervision PT Recommendation Follow Up Recommendations: 24 hour supervision/assistance;Home health PT Patient destination: Home Equipment Recommended: To be determined   PT Evaluation Precautions/Restrictions Precautions Precautions: Cervical;Back Precaution Booklet Issued: Yes (comment) Precaution Comments: trach collar, TLSO don with rolling if HOB >30,  abdominal binder to protect PEG, c-collar d/c'ed 5/24 Required Braces or Orthoses: Spinal Brace Cervical Brace: Hard collar;At all times Spinal Brace: Thoracolumbosacral orthotic;Applied in supine position Restrictions Weight Bearing Restrictions: No General Chart Reviewed: Yes Family/Caregiver Present: No  Home Living/Prior Functioning Home Living Available Help at Discharge: Family;Available PRN/intermittently Type of Home: House Home Access: Stairs to enter [employer] of Steps: 3 Entrance Stairs-Rails: None Home Layout: Two level;Able to live on main level with bedroom/bathroom Alternate Level Stairs-Number  of Steps: Patient does not go upstairs Bathroom Shower/Tub: Chiropodist: Standard  Lives With: Other (Comment) Prior Function Level of Independence: Independent with basic ADLs;Independent with gait;Independent with homemaking with ambulation  Able to Take Stairs?: Yes Driving: No Vocation: On disability Vision/Perception  Perception Perception: Within Functional Limits Praxis Praxis: Intact  Cognition Overall Cognitive Status: Impaired/Different from baseline  Arousal/Alertness: Awake/alert Orientation Level: Oriented X4 Attention: Sustained Sustained Attention: Impaired Sustained Attention Impairment: Verbal basic;Functional basic Memory: Impaired Memory Impairment: Decreased recall of new information Immediate Memory Recall: Sock;Bed;Blue Memory Recall Sock: With Cue Memory Recall Blue: With Cue Memory Recall Bed: Not able to recall Awareness: Impaired Awareness Impairment: Emergent impairment Problem Solving: Impaired Safety/Judgment: Impaired [employer] Scales of Cognitive Functioning: Automatic/appropriate Sensation Sensation Light Touch: Appears Intact Hot/Cold: Appears Intact Coordination Gross Motor Movements are Fluid and Coordinated: No Coordination and Movement Description: generalized weakness Finger Nose Finger Test: [facility] Motor  Motor Motor: Other (comment) Motor - Skilled Clinical Observations: generalized weakness  Trunk/Postural Assessment  Cervical Assessment Cervical Assessment: Exceptions to [facility] (forward head) Thoracic Assessment Thoracic Assessment: Exceptions to [facility] (thoracic fx) Lumbar Assessment Lumbar Assessment: Exceptions to [facility] (posterior pelvic tilt) Postural Control Postural Control: Deficits on evaluation Righting Reactions: delayed  Balance Balance Balance Assessed: Yes Static Sitting Balance Static Sitting - Balance Support: No upper extremity supported Static Sitting - Level of Assistance: 5: Stand  by assistance Dynamic Sitting Balance Dynamic Sitting - Balance Support: No upper extremity supported Dynamic Sitting - Level of Assistance: 5: Stand by assistance Static Standing Balance Static Standing - Balance Support: No upper extremity supported Static Standing - Level of Assistance: 4: Min assist Dynamic Standing Balance Dynamic Standing - Balance Support: No upper extremity supported Dynamic Standing - Level of Assistance: 3: Mod assist Extremity Assessment  RUE Assessment RUE Assessment: Exceptions to [facility] Strength Comments: R shoulder flexion limited to 30 degrees. Elbow AROM WFL, hand normal LUE Assessment LUE Assessment: Within Functional Limits RLE Assessment General Strength Comments: Grossly 4/5 LLE Assessment General Strength Comments: Grossly 4/5  Care Tool Care Tool Bed Mobility Roll left and right activity   Roll left and right assist level: Contact Guard/Touching assist    Sit to lying activity   Sit to lying assist level: Contact Guard/Touching assist    Lying to sitting edge of bed activity   Lying to sitting edge of bed assist level: Contact Guard/Touching assist     Care Tool Transfers Sit to stand transfer   Sit to stand assist level: Minimal Assistance - Patient > 75%    Chair/bed transfer   Chair/bed transfer assist level: Moderate Assistance - Patient 50 - 74%     Toilet transfer   Assist Level: Moderate Assistance - Patient 50 - 74%    Car transfer   Car transfer assist level: Moderate Assistance - Patient 50 - 74%      Care Tool Locomotion Ambulation   Assist level: 2 helpers Assistive device: Hand held assist Max distance: 60'  Walk 10 feet activity   Assist level: 2 helpers Assistive device: Hand held assist   Walk 50 feet with 2 turns activity   Assist level: 2 helpers Assistive device: Hand held assist  Walk 150 feet activity Walk 150 feet activity did not occur: Safety/medical concerns      Walk 10 feet on uneven  surfaces activity Walk 10 feet on uneven surfaces activity did not occur: Safety/medical concerns      Stairs Stair activity did not occur: Safety/medical concerns (Pt unable to attempt due to dyspnea)        Walk up/down 1 step activity Walk up/down 1 step or curb (drop down) activity did not occur: Safety/medical concerns     Walk up/down 4 steps activity did not occuR: Safety/medical concerns  Walk up/down 4 steps activity      Walk up/down  12 steps activity Walk up/down 12 steps activity did not occur: Safety/medical concerns      Pick up small objects from floor Pick up small object from the floor (from standing position) activity did not occur: Safety/medical concerns      Wheelchair Will patient use wheelchair at discharge?: No          Wheel 50 feet with 2 turns activity      Wheel 150 feet activity        Refer to Care Plan for Long Term Goals  SHORT TERM GOAL WEEK 1 PT Short Term Goal 1 (Week 1): Pt will perform sit to stand with CGA PT Short Term Goal 2 (Week 1): Pt will perform bed to chair transfer with CGA. PT Short Term Goal 3 (Week 1): Pt will ambulate 100' with CGA and LRAD.  Recommendations for other services: None   Skilled Therapeutic Intervention  Evaluation completed (see details above and below) with education on PT POC and goals and individual treatment initiated with focus on bed mobility, balance, strength, transfers, and ambulation. Pt received supine in bed and agrees to therapy. PT provides total A to don TLSO. Reports pain in back and R shoulder and R hand. Number not provided. PT provides bracing and repositioning to manage pain. Supine to sit with supervision and cues for hand placement. Pt performs sit to stand with minA and stand pivot transfer to [facility] with modA. WC transport to gym for time management. Pt performs car transfer with modA HHA. Seated rest break following transfer due to dyspnea. Pt on 6L O2 via trach collar.PT checks vitals and  pt satting at 94%. Following extended seated rest break, pt ambulates x60' with modA +1 HHA and +2 to manage oxygen tank and tubing. Pt very dyspneic following ambulation and takes extended time to recover, though sats remains >90%. Stairs not attempted at this time due to dyspnea and fatigue. WC transport back to room. Stand pivot transfer back to bed with modA. Sit to supine with supervision. Pt performs bilateral rolling to doff TLSO. Left supine with alarm intact and all needs within reach.   Mobility Bed Mobility Bed Mobility: Rolling Right;Rolling Left;Supine to Sit;Sit to Supine Rolling Right: Contact Guard/Touching assist Rolling Left: Contact Guard/Touching assist Supine to Sit: Minimal Assistance - Patient > 75% Sit to Supine: Minimal Assistance - Patient > 75% Transfers Transfers: Sit to Stand;Stand Pivot Transfers;Stand to Sit Sit to Stand: Minimal Assistance - Patient > 75% Stand to Sit: Minimal Assistance - Patient > 75% Stand Pivot Transfers: Moderate Assistance - Patient 50 - 74% Transfer (Assistive device): None Locomotion  Gait Ambulation: Yes Gait Assistance: 2 Helpers Gait Distance (Feet): 60 Feet Assistive device: 1 person hand held assist (+2 to manage O2 tank) Gait Assistance Details: Tactile cues for initiation;Tactile cues for posture;Verbal cues for sequencing;Verbal cues for gait pattern;Verbal cues for safe use of DME/AE;Verbal cues for technique;Tactile cues for sequencing Gait Gait: Yes Gait Pattern: Impaired Gait Pattern: Shuffle Gait velocity: decreased Stairs / Additional Locomotion Stairs: No Wheelchair Mobility Wheelchair Mobility: No   Discharge Criteria: Patient will be discharged from PT if patient refuses treatment 3 consecutive times without medical reason, if treatment goals not met, if there is a change in medical status, if patient makes no progress towards goals or if patient is discharged from hospital.  The above assessment, treatment  plan, treatment alternatives and goals were discussed and mutually agreed upon: by patient  Breck Coons, PT, DPT 10/10/2020, 3:06  PM

## 2020-10-10 NOTE — Progress Notes (Addendum)
Initial Nutrition Assessment  DOCUMENTATION CODES:   Not applicable  INTERVENTION:  Continue Ensure Enlive po TID, each supplement provides 350 kcal and 20 grams of protein.  Initiate nocturnal tube feeds via PEG using Jevity 1.5 cal formula at goal rate of 80 x 12 hours (7pm-7am).  Provide 45 ml Prosource TF BID per tube.   Tube feeding regimen to provide 1520 kcal (76% of needs), 83 grams of protein, and 730 ml free water.   NUTRITION DIAGNOSIS:   Inadequate oral intake related to poor appetite as evidenced by meal completion < 25%.  GOAL:   Patient will meet greater than or equal to 90% of their needs  MONITOR:   PO intake,Supplement acceptance,Diet advancement,Skin,TF tolerance,Weight trends,Labs,I & O's  REASON FOR ASSESSMENT:   Consult Enteral/tube feeding initiation and management  ASSESSMENT:   55 year old right-handed male with history of COPD hypertension, alcohol/tobacco use presented with level 1 fall after a fall down approximately 11 stairs. CT cervical spine showed mildly displaced acute fractures of the right C7 transverse process extending to involve the transverse foramen. Fractures through the neural arch of T7.  Multiple right transverse process fractures involving the T4-T8 vertebral bodies.  Multiple bilateral rib fractures at the costovertebral junction from T5-T8 on the right and T7 and T8 on the left. Body fracture non-operative. Hospital course prolonged intubation undergoing tracheostomy as well as gastrostomy tube placement 09/03/2020. Pt with patient decreased functional mobility was admitted for a comprehensive rehab program.   Pt continues on trach collar. Pt on a dysphagia 2 diet with thin liquids. Meal completion has been poor 0-25% since admission to CIR. Pt reports no appetite and has only been consuming a couple bites of food at meals. Pt currently has Ensure ordered and has been consuming them. RD to order nocturnal tube feeds to aid in adequate  nutrition. Pt encouraged to eat his food at meals and to drink his supplements. RD to modify tube feeds as po intake improves.   NUTRITION - FOCUSED PHYSICAL EXAM:  Flowsheet Row Most Recent Value  Orbital Region No depletion  Upper Arm Region No depletion  Thoracic and Lumbar Region No depletion  Buccal Region No depletion  Temple Region No depletion  Clavicle Bone Region No depletion  Clavicle and Acromion Bone Region No depletion  Scapular Bone Region Unable to assess  Dorsal Hand No depletion  Patellar Region No depletion  Anterior Thigh Region No depletion  Posterior Calf Region No depletion  Edema (RD Assessment) Mild  Hair Reviewed  Eyes Reviewed  Mouth Reviewed  Skin Reviewed  Nails Reviewed     Labs and medications reviewed.   Diet Order:   Diet Order            DIET DYS 2 Room service appropriate? No; Fluid consistency: Thin  Diet effective now                 EDUCATION NEEDS:   Not appropriate for education at this time  Skin:  Skin Assessment: Reviewed RN Assessment  Last BM:  5/22  Height:   Ht Readings from Last 1 Encounters:  10/09/20 5\' 8"  (1.727 m)    Weight:   Wt Readings from Last 1 Encounters:  10/09/20 80.5 kg   BMI:  Body mass index is 26.98 kg/m.  Estimated Nutritional Needs:   Kcal:  2000-2200  Protein:  110-120 grams  Fluid:  >/= 2 L/day  10/11/20, MS, RD, LDN RD pager number/after hours weekend pager number on  Amion.

## 2020-10-11 DIAGNOSIS — R1312 Dysphagia, oropharyngeal phase: Secondary | ICD-10-CM | POA: Diagnosis not present

## 2020-10-11 DIAGNOSIS — Z930 Tracheostomy status: Secondary | ICD-10-CM | POA: Diagnosis not present

## 2020-10-11 DIAGNOSIS — R338 Other retention of urine: Secondary | ICD-10-CM | POA: Diagnosis not present

## 2020-10-11 DIAGNOSIS — N401 Enlarged prostate with lower urinary tract symptoms: Secondary | ICD-10-CM | POA: Diagnosis not present

## 2020-10-11 LAB — GLUCOSE, CAPILLARY
Glucose-Capillary: 156 mg/dL — ABNORMAL HIGH (ref 70–99)
Glucose-Capillary: 178 mg/dL — ABNORMAL HIGH (ref 70–99)
Glucose-Capillary: 149 mg/dL — ABNORMAL HIGH (ref 70–99)
Glucose-Capillary: 123 mg/dL — ABNORMAL HIGH (ref 70–99)
Glucose-Capillary: 133 mg/dL — ABNORMAL HIGH (ref 70–99)
Glucose-Capillary: 142 mg/dL — ABNORMAL HIGH (ref 70–99)

## 2020-10-11 NOTE — Progress Notes (Signed)
Physical Therapy Session Note  Patient Details  Name: Charles Weeks MRN: [id number] Date of Birth: Oct 19, 1965  Today's Date: 10/11/2020 PT Missed Time: 60 Minutes Missed Time Reason: Patient fatigue  Short Term Goals: Week 1:  PT Short Term Goal 1 (Week 1): Pt will perform sit to stand with CGA PT Short Term Goal 2 (Week 1): Pt will perform bed to chair transfer with CGA. PT Short Term Goal 3 (Week 1): Pt will ambulate 100' with CGA and LRAD.  Skilled Therapeutic Interventions/Progress Updates:     Pt misses 60 minutes of skilled PT due to fatigue, saying he is "worn out". PT educates on importance of participation. Will follow up as appropriate.  Therapy Documentation Precautions:  Precautions Precautions: Cervical,Back Precaution Booklet Issued: Yes (comment) Precaution Comments: trach collar, TLSO don with rolling if HOB >30,  abdominal binder to protect PEG, c-collar d/c'ed 5/24 Required Braces or Orthoses: Spinal Brace Cervical Brace: Hard collar,At all times Spinal Brace: Thoracolumbosacral orthotic,Applied in supine position Restrictions Weight Bearing Restrictions: No General: PT Amount of Missed Time (min): 60 Minutes PT Missed Treatment Reason: Patient fatigue    Therapy/Group: Individual Therapy  Beau Fanny, PT, DPT 10/11/2020, 3:56 PM

## 2020-10-11 NOTE — Progress Notes (Signed)
Speech Language Pathology TBI Note  Patient Details  Name: Charles Weeks MRN: [id number] Date of Birth: 06-23-1965  Today's Date: 10/11/2020 SLP Individual Time: 0820-0900 SLP Individual Time Calculation (min): 40 min  Short Term Goals: Week 1: SLP Short Term Goal 1 (Week 1): Patient will consume Dys 3 solids trials without significant changes in vitals (oxygen saturations, etc) and without overt s/s aspiration or penetration. SLP Short Term Goal 2 (Week 1): Patient will perform basic level medication and money management tasks with minA cues. SLP Short Term Goal 3 (Week 1): Patient will demonstrate awareness to deficits and impact on his function during simulated tasks with minA. SLP Short Term Goal 4 (Week 1): Patient will demonstrate basic level alternating attention during functional tasks with min-modA cues. SLP Short Term Goal 5 (Week 1): Patient will maintain adequate vocal intensity and speech intelligibility (at 85% intelligible) during semi-structured conversations, with min-modA cues.  Skilled Therapeutic Interventions: Skilled treatment session focused on speech, swallowing and cognitive goals. Upon arrival, patient was sitting EOB without TLSO in place with staff present. Patient reported he attempted to get to the [facility] independently due to urgency. SLP provided extensive education regarding importance of donning TLSO in supine when bed is greater than 30 degrees. Patient verbalized understanding and the brace was donned. Patient's PMSV was in place throughout session with O2 saturations ranging between 85%-93% with Mod verbal cues needed for use of an increased vocal intensity to improve intelligibility. Patient consumed thin liquids via straw without overt s/s of aspiration, recommend patient continue current diet. Patient requested to sit in recliner at end of session and left with chair alarm in place. Continue with current plan of care.      Pain Pain Assessment Pain Scale:  0-10 Pain Score: 7  Faces Pain Scale: Hurts even more Pain Type: Acute pain Pain Location: Shoulder Pain Orientation: Right Pain Descriptors / Indicators: Aching  Agitated Behavior Scale: TBI Observation Details Observation Environment: Patient's room Start of observation period - Date: 10/11/20 Start of observation period - Time: 0820 End of observation period - Date: 10/11/20 End of observation period - Time: 0900 Agitated Behavior Scale (DO NOT LEAVE BLANKS) Short attention span, easy distractibility, inability to concentrate: Absent Impulsive, impatient, low tolerance for pain or frustration: Present to a slight degree Uncooperative, resistant to care, demanding: Absent Violent and/or threatening violence toward people or property: Absent Explosive and/or unpredictable anger: Absent Rocking, rubbing, moaning, or other self-stimulating behavior: Absent Pulling at tubes, restraints, etc.: Absent Wandering from treatment areas: Absent Restlessness, pacing, excessive movement: Absent Repetitive behaviors, motor, and/or verbal: Absent Rapid, loud, or excessive talking: Absent Sudden changes of mood: Absent Easily initiated or excessive crying and/or laughter: Absent Self-abusiveness, physical and/or verbal: Absent Agitated behavior scale total score: 15  Therapy/Group: Individual Therapy  Kalli Greenfield 10/11/2020, 12:44 PM

## 2020-10-11 NOTE — Progress Notes (Signed)
PROGRESS NOTE   Subjective/Complaints: Had a better night. Was able to sleep. Pain under reasonable control. Had questions about CTO.  ROS: Patient denies fever, rash, sore throat, blurred vision, nausea, vomiting, diarrhea, cough, shortness of breath or chest pain,   headache, or mood change.    Objective:   VAS [place] LOWER EXTREMITY VENOUS (DVT)  Result Date: 10/10/2020  Lower Venous DVT Study Patient Name:  Charles Weeks  Date of Exam:   10/09/2020 Medical Rec #: [id number]       Accession #:    [id number] Date of Birth: 12-03-1965      Patient Gender: M Patient Age:   67Y Exam Location:  [facility] Procedure:      VAS [place] LOWER EXTREMITY VENOUS (DVT) Referring Phys: [id number] DANIEL J ANGIULLI --------------------------------------------------------------------------------  Indications: Swelling per MD.  Risk Factors: Prolonged hospital stay - trauma with immobilization. Comparison Study: No previous exams. Performing Technologist: Ernestene Mention  Examination Guidelines: A complete evaluation includes B-mode imaging, spectral Doppler, color Doppler, and power Doppler as needed of all accessible portions of each vessel. Bilateral testing is considered an integral part of a complete examination. Limited examinations for reoccurring indications may be performed as noted. The reflux portion of the exam is performed with the patient in reverse Trendelenburg.  +---------+---------------+---------+-----------+----------+--------------+ RIGHT    CompressibilityPhasicitySpontaneityPropertiesThrombus Aging +---------+---------------+---------+-----------+----------+--------------+ CFV      Full           Yes      Yes                                 +---------+---------------+---------+-----------+----------+--------------+ SFJ      Full                                                         +---------+---------------+---------+-----------+----------+--------------+ FV Prox  Full           Yes      Yes                                 +---------+---------------+---------+-----------+----------+--------------+ FV Mid   Full           Yes      Yes                                 +---------+---------------+---------+-----------+----------+--------------+ FV DistalFull           Yes      Yes                                 +---------+---------------+---------+-----------+----------+--------------+ PFV      Full                                                        +---------+---------------+---------+-----------+----------+--------------+  POP      Full           Yes      Yes                                 +---------+---------------+---------+-----------+----------+--------------+ PTV      Full                                                        +---------+---------------+---------+-----------+----------+--------------+ PERO     Full                                                        +---------+---------------+---------+-----------+----------+--------------+   +---------+---------------+---------+-----------+----------+--------------+ LEFT     CompressibilityPhasicitySpontaneityPropertiesThrombus Aging +---------+---------------+---------+-----------+----------+--------------+ CFV      Full           Yes      Yes                                 +---------+---------------+---------+-----------+----------+--------------+ SFJ      Full                                                        +---------+---------------+---------+-----------+----------+--------------+ FV Prox  Full           Yes      Yes                                 +---------+---------------+---------+-----------+----------+--------------+ FV Mid   Full           Yes      Yes                                  +---------+---------------+---------+-----------+----------+--------------+ FV DistalFull           Yes      Yes                                 +---------+---------------+---------+-----------+----------+--------------+ PFV      Full                                                        +---------+---------------+---------+-----------+----------+--------------+ POP      Full           Yes      Yes                                 +---------+---------------+---------+-----------+----------+--------------+ PTV  Full                                                        +---------+---------------+---------+-----------+----------+--------------+ PERO     Full                                                        +---------+---------------+---------+-----------+----------+--------------+     Summary: BILATERAL: - No evidence of deep vein thrombosis seen in the lower extremities, bilaterally. - No evidence of superficial venous thrombosis in the lower extremities, bilaterally. -No evidence of popliteal cyst, bilaterally.   *See table(s) above for measurements and observations. Electronically signed by Waverly Ferrari MD on 10/10/2020 at 6:53:27 AM.    Final    Recent Labs    10/09/20 1842 10/10/20 0507  WBC 10.5 7.4  HGB 12.0* 12.0*  HCT 37.9* 38.2*  PLT 320 193   Recent Labs    10/09/20 1842 10/10/20 0623  NA  --  139  K  --  3.9  CL  --  100  CO2  --  30  GLUCOSE  --  119*  BUN  --  17  CREATININE 0.80 0.85  CALCIUM  --  9.4    Intake/Output Summary (Last 24 hours) at 10/11/2020 0904 Last data filed at 10/11/2020 0853 Gross per 24 hour  Intake 240 ml  Output 1250 ml  Net -1010 ml        Physical Exam: Vital Signs Blood pressure 135/82, pulse 90, temperature 97.9 F (36.6 C), resp. rate 18, height 5\' 8"  (1.727 m), weight 80.5 kg, SpO2 90 %.  Constitutional: No distress . Vital signs reviewed. HEENT: EOMI, oral membranes moist Neck: trach  with PMV, phonating better Cardiovascular: RRR without murmur. No JVD    Respiratory/Chest: CTA Bilaterally without wheezes or rales. Normal effort    GI/Abdomen: BS +, non-tender, non-distended Ext: no clubbing, cyanosis, or edema Psych: pleasant and cooperative Skin: PEG site clean, foam dressing around neck Neuro: alert. Follows commands. Fair awareness. improved processing. UE's 3-4/5. LE 3-4/5. no focal sensory deficits. No resting tone. Good sitting balance Musculoskeletal: right shoulder and back TTP and with ROM. CTO fitting appropriately    Assessment/Plan: 1. Functional deficits which require 3+ hours per day of interdisciplinary therapy in a comprehensive inpatient rehab setting.  Physiatrist is providing close team supervision and 24 hour management of active medical problems listed below.  Physiatrist and rehab team continue to assess barriers to discharge/monitor patient progress toward functional and medical goals  Care Tool:  Bathing    Body parts bathed by patient: Right arm,Left arm,Chest,Abdomen,Front perineal area,Buttocks,Right upper leg,Left upper leg,Face   Body parts bathed by helper: Left lower leg,Right lower leg     Bathing assist Assist Level: Moderate Assistance - Patient 50 - 74%     Upper Body Dressing/Undressing Upper body dressing   What is the patient wearing?: Hospital gown only    Upper body assist Assist Level: Minimal Assistance - Patient > 75%    Lower Body Dressing/Undressing Lower body dressing      What is the patient wearing?: Hospital gown only     Lower body assist  Toileting Toileting    Toileting assist Assist for toileting: Moderate Assistance - Patient 50 - 74%     Transfers Chair/bed transfer  Transfers assist     Chair/bed transfer assist level: Moderate Assistance - Patient 50 - 74%     Locomotion Ambulation   Ambulation assist      Assist level: 2 helpers Assistive device: Hand held  assist Max distance: 60'   Walk 10 feet activity   Assist     Assist level: 2 helpers Assistive device: Hand held assist   Walk 50 feet activity   Assist    Assist level: 2 helpers Assistive device: Hand held assist    Walk 150 feet activity   Assist Walk 150 feet activity did not occur: Safety/medical concerns         Walk 10 feet on uneven surface  activity   Assist Walk 10 feet on uneven surfaces activity did not occur: Safety/medical concerns         Wheelchair     Assist Will patient use wheelchair at discharge?: No             Wheelchair 50 feet with 2 turns activity    Assist            Wheelchair 150 feet activity     Assist          Blood pressure 135/82, pulse 90, temperature 97.9 F (36.6 C), resp. rate 18, height 5\' 8"  (1.727 m), weight 80.5 kg, SpO2 90 %.  Medical Problem List and Plan: 1.Debilitysecondary to fall with 3 column T8 vertebral body fracture/T7 neural arch fracture/right TVP T4-8/right C7 transverse process fracture through the foramen. CTO to be applied in supine position -patient may shower when has replacement pads for CTO -ELOS/Goals: ~2-2.5 weeks- supervision to min A  -Patient is beginning CIR therapies today including PT, OT, and SLP  2. Antithrombotics: -DVT/anticoagulation:Lovenox.    -vascular study negative -antiplatelet therapy: Aspirin 81 mg daily 3. Pain Management:Oxycodone as needed 4. Mood:Wellbutrin 300 mg daily, BuSpar 10 mg 3 times daily, Klonopin 0.5 mg nightly, -antipsychotic agents: Seroquel 150 mg twice daily and 300 mg nightly  -record sleep chart  -behavior non-agitated at this time 5. Neuropsych: This patientis notcapable of making decisions on hisown behalf. 6. Skin/Wound Care:Routine skin checks 7. Fluids/Electrolytes/Nutrition:encourage PO, TF as below   -recent lab work is within normal limits.   8.  Acute blood loss anemia. Follow-up CBC 9. Multiple bilateral rib fractures. Conservative care 10. 3: T8 vertebral body fracture. Follow-up neurosurgery Dr. . Nonoperative.   -CTO for HOB>30 degrees at this point 11. T7 neural arch fracture. Follow-up neurosurgery 12. Right TVP T4-8. Pain control follow neurosurgery 13. Right C7 TVP fracture through foramen. CTA negative. CTO 14. Large scalp laceration right eyebrow laceration. Repaired 08/17/2020. Sutures removed 15. Hypertension. Lopressor 25 mg twice daily 16. CAD/MI/DES. Continue aspirin 17. Acute hypercarbic ventilatory dependent respiratory failure/COPD. Tracheostomy 09/03/2020 per Dr. 09/05/2020. Changed back to #6 cuffless 10/05/2020 due to secretions. Patient will follow up long-term with Dr. 10/07/2020  -tolerating PMV currently, secretions are minimal  -may ask PCCM to follow up next week if he continues to do well 18. Dysphagia. Gastrostomy tube 09/03/2020 per Dr. 09/05/2020.   -Dysphagia #2 thin liquids--advance as tolerated 19. BPH. Continue Hytrin/Flomax/Urecholine.   -still retaining: 700cc early this morning.   -in/out caths q6 hours until he empties/voids.  -oob to void asap 20. History of alcohol tobacco abuse. Provide counseling 21. Constipation---sorbitol 5/25 with  results  -scheduled senna-s    LOS: 2 days A FACE TO FACE EVALUATION WAS PERFORMED  Ranelle Oyster 10/11/2020, 9:04 AM

## 2020-10-11 NOTE — Plan of Care (Signed)
Behavioral Plan   [place] Level: VI  Behavior to decrease/ eliminate:  Impulsivity   Changes to environment:  Offer to allow pt to sit in recliner following therapy and for meals.  Interventions: Needs to wear PMSV during all waking hours and during meals. Ensure TSLO when HOB>30 and anytime out of bed. Don in supine. Try to schedule pt for later in the day. After 9am if possible. Belt alarm when in recliner.  Recommendations for interactions with patient: Pt does better with verbal versus written material.  Attendees: Serita Sheller Doe

## 2020-10-11 NOTE — Progress Notes (Signed)
Pt slept through the night.

## 2020-10-11 NOTE — Progress Notes (Signed)
Physical Therapy Session Note  Patient Details  Name: Charles Weeks MRN: [id number] Date of Birth: Oct 10, 1965  Today's Date: 10/11/2020 PT Individual Time: 0930-1000 PT Individual Time Calculation (min): 30 min   Short Term Goals: Week 1:  PT Short Term Goal 1 (Week 1): Pt will perform sit to stand with CGA PT Short Term Goal 2 (Week 1): Pt will perform bed to chair transfer with CGA. PT Short Term Goal 3 (Week 1): Pt will ambulate 100' with CGA and LRAD.  Skilled Therapeutic Interventions/Progress Updates:    Patient received sitting up in recliner, agreeable to PT. He reports 6/10 pain in midback/shoulders. RN alerted, but patient declining pain rx stating it makes him too sleepy. PT providing rest breaks, distractions and repositioning to assist with pain management. Patient currently on 5L O2 via trach collar satting at 87-90%, HR 90-94BPM. Educated patient on pursed lip breathing and importance of deep breaths, especially given rib fxs, to allow for full rib and lung expansion to prevent further pulmonary complications. Patient transferring to wc via stand pivot with MinA, HHA. PT transporting patient in wc to therapy gym for time management and energy conservation. Kinetron 50cm/s 3x2' completed with patient satting >90% throughout. Patient returning to room in wc, requesting to use bathroom. MinA HHA ambulatory transfer to toilet. Handoff to NT/ OT to continue care.   Therapy Documentation Precautions:  Precautions Precautions: Cervical,Back Precaution Booklet Issued: Yes (comment) Precaution Comments: trach collar, TLSO don with rolling if HOB >30,  abdominal binder to protect PEG, c-collar d/c'ed 5/24 Required Braces or Orthoses: Spinal Brace Cervical Brace: Hard collar,At all times Spinal Brace: Thoracolumbosacral orthotic,Applied in supine position Restrictions Weight Bearing Restrictions: No   Therapy/Group: Individual Therapy  Elizebeth Koller, PT,  DPT, CBIS  10/11/2020, 8:40 AM

## 2020-10-11 NOTE — Progress Notes (Signed)
Occupational Therapy Session Note  Patient Details  Name: Charles Weeks MRN: [id number] Date of Birth: [birth year]  Today's Date: 10/11/2020 OT Individual Time: 1002-1102 OT Individual Time Calculation (min): 60 min   Short Term Goals: Week 1:  OT Short Term Goal 1 (Week 1): Pt will complete toileting tasks with min A OT Short Term Goal 2 (Week 1): Pt will transfer to toilet with min A OT Short Term Goal 3 (Week 1): Pt will instruct caregiver on donning TLSO with min cueing OT Short Term Goal 4 (Week 1): Pt will use LRAD to wash LB with min A  Skilled Therapeutic Interventions/Progress Updates:    Met pt sitting on toilet with NT present. Pt was Min A for sit > stand transfer from toilet. Pt's oxygen level was an 87 after using the bathroom but recovered to 92 with 6 litters of oxygen. Pt was Min A for stand pivot transfers. OT offered to assist with dressing but pt declined. OT downgraded PCP pipe activity and provided the pt with the pieces he will need due to not knowing if pt will become frustrated when not being able to find the correct piece. OT provided cues for initial placement of pipes. Pt was able to stand and use fine motor skills to attach pieces together. Pt was Min A for sit > stand transfer and stabilized by holding the table. OT had pt sit down to complete different pipe assembly due to higher level of difficulty. Pt required cues to assist with deciphering pieces to use for pipe activity due to visual diffulties. Throughout pipe activity, pt was having difficulty with using his R hand because of tingling and R shoulder weakness. Pt was able to complete 20 reps of towel glides on a table using RUE. Pt was able to do increase shoulder flexion and crossing midline with towel glides on a wedge but reported pain after activity. Pt required breaks between activities to regulate oxygen levels and minimize shoulder pain. Pt will continue to benefit from education on energy conservation  techniques. Left pt sitting in recliner, safety belt attached, and call bell within reach.   Therapy Documentation Precautions:  Precautions Precautions: Cervical,Back Precaution Booklet Issued: Yes (comment) Precaution Comments: trach collar, TLSO don with rolling if HOB >30,  abdominal binder to protect PEG, c-collar d/c'ed 5/24 Required Braces or Orthoses: Spinal Brace Cervical Brace: Hard collar,At all times Spinal Brace: Thoracolumbosacral orthotic,Applied in supine position Restrictions Weight Bearing Restrictions: No Pain:  Pt reports pain in R shoulder, no number given. Repositioned for pain management    Therapy/Group: Individual Therapy  Valma Cava 10/11/2020, 12:49 PM

## 2020-10-11 NOTE — Care Management (Signed)
Inpatient Rehabilitation Center Individual Statement of Services  Patient Name:  Rodgerick Gilliand  Date:  10/11/2020  Welcome to the Inpatient Rehabilitation Center.  Our goal is to provide you with an individualized program based on your diagnosis and situation, designed to meet your specific needs.  With this comprehensive rehabilitation program, you will be expected to participate in at least 3 hours of rehabilitation therapies Monday-Friday, with modified therapy programming on the weekends.  Your rehabilitation program will include the following services:  Physical Therapy (PT), Occupational Therapy (OT), Speech Therapy (ST), 24 hour per day rehabilitation nursing, Therapeutic Recreaction (TR), Psychology, Neuropsychology, Care Coordinator, Rehabilitation Medicine, Nutrition Services, Pharmacy Services and Other  Weekly team conferences will be held on Tuesdays to discuss your progress.  Your Inpatient Rehabilitation Care Coordinator will talk with you frequently to get your input and to update you on team discussions.  Team conferences with you and your family in attendance may also be held.  Expected length of stay: 2-2.5 weeks  Overall anticipated outcome:   Depending on your progress and recovery, your program may change. Your Inpatient Rehabilitation Care Coordinator will coordinate services and will keep you informed of any changes. Your Inpatient Rehabilitation Care Coordinator's name and contact numbers are listed  below.  The following services may also be recommended but are not provided by the Inpatient Rehabilitation Center:   Driving Evaluations  Home Health Rehabiltiation Services  Outpatient Rehabilitation Services  Vocational Rehabilitation   Arrangements will be made to provide these services after discharge if needed.  Arrangements include referral to agencies that provide these services.  Your insurance has been verified to be:  [place] [employer]  Your primary doctor is:  Supervision  Pertinent information will be shared with your doctor and your insurance company.  Inpatient Rehabilitation Care Coordinator:  Susie Cassette [phone] or [phone]  Information discussed with and copy given to patient by: Gretchen Short, 10/11/2020, 10:13 AM

## 2020-10-12 LAB — GLUCOSE, CAPILLARY
Glucose-Capillary: 109 mg/dL — ABNORMAL HIGH (ref 70–99)
Glucose-Capillary: 142 mg/dL — ABNORMAL HIGH (ref 70–99)
Glucose-Capillary: 145 mg/dL — ABNORMAL HIGH (ref 70–99)
Glucose-Capillary: 168 mg/dL — ABNORMAL HIGH (ref 70–99)
Glucose-Capillary: 169 mg/dL — ABNORMAL HIGH (ref 70–99)
Glucose-Capillary: 179 mg/dL — ABNORMAL HIGH (ref 70–99)
Glucose-Capillary: 202 mg/dL — ABNORMAL HIGH (ref 70–99)

## 2020-10-12 MED ORDER — GABAPENTIN 100 MG PO CAPS
100.0000 mg | ORAL_CAPSULE | Freq: Three times a day (TID) | ORAL | Status: DC
  Administered 2020-10-12 – 2020-10-25 (×39): 100 mg via ORAL
  Filled 2020-10-12 (×39): qty 1

## 2020-10-12 NOTE — Progress Notes (Signed)
Occupational Therapy Session Note  Patient Details  Name: Charles Weeks MRN: [id number] Date of Birth: January 10, 1966  Today's Date: 10/12/2020 OT Individual Time: 1400-1437 OT Individual Time Calculation (min): 37 min    Short Term Goals: Week 1:  OT Short Term Goal 1 (Week 1): Pt will complete toileting tasks with min A OT Short Term Goal 2 (Week 1): Pt will transfer to toilet with min A OT Short Term Goal 3 (Week 1): Pt will instruct caregiver on donning TLSO with min cueing OT Short Term Goal 4 (Week 1): Pt will use LRAD to wash LB with min A  Skilled Therapeutic Interventions/Progress Updates:    Pt received semi-reclined in bed with TLSO doffed, HOB greater than 30 degrees. Edu pt on importance of wearing TLSO when [facility] is greater than 30 per MD order. Pt c/o RUE and back pain, did not quantify, but agreeable to therapy. Declines ADL. Rolled R/L with S to don TLSO total A. Came to sitting EOB with use of bed rail and close S. Stand-pivot no AD with CGA to and from w/c throughout session. W/c transport total A to and form gym 2/2 time management and energy conservation. Stood to play checkers in prep for improved standing activity tolerance; completed STS with CGA and able to tolerate standing for 5 min before req seated rest break. R shoulder flexion limited to ~90 degrees 2/2 R shoulder injury post fall per pt. Modified RPE at 9/10. SatO2 at 88 - 93% on 6L via trach collar with activity.   Pt left seated EOB with bed alarm engaged, call bell in reach, and all immediate needs met. Hooked back to room O2 at 5L, satting at 89 - 90%. Awaiting following PT session.   Therapy Documentation Precautions:  Precautions Precautions: Cervical,Back Precaution Booklet Issued: Yes (comment) Precaution Comments: trach collar, TLSO don with rolling if HOB >30,  abdominal binder to protect PEG, c-collar d/c'ed 5/24 Required Braces or Orthoses: Spinal Brace Cervical Brace: Hard collar,At all  times Spinal Brace: Thoracolumbosacral orthotic,Applied in supine position Restrictions Weight Bearing Restrictions: No Pain:  see session note ADL: See Care Tool for more details.  Therapy/Group: Individual Therapy  Volanda Napoleon MS, OTR/L  10/12/2020, 7:02 AM

## 2020-10-12 NOTE — Progress Notes (Signed)
Occupational Therapy Session Note  Patient Details  Name: Charles Weeks MRN: [id number] Date of Birth: 16-Dec-1965  Today's Date: 10/12/2020 OT Individual Time: 7253-6644 OT Individual Time Calculation (min): 70 min   Short Term Goals: Week 1:  OT Short Term Goal 1 (Week 1): Pt will complete toileting tasks with min A OT Short Term Goal 2 (Week 1): Pt will transfer to toilet with min A OT Short Term Goal 3 (Week 1): Pt will instruct caregiver on donning TLSO with min cueing OT Short Term Goal 4 (Week 1): Pt will use LRAD to wash LB with min A  Skilled Therapeutic Interventions/Progress Updates:    Pt greeted sitting in the recliner, wearing his TLSO, satting at 93% via trach collar (5L fi02 28%). Pt declined toileting or engaging in any other self care task. Therefore session focus was placed on standing endurance while maintaining stable vitals and functional Rt hand use. Pt used the Rt hand per his pain tolerance. Set him up with a bimanual tabletop task, CGA sit<stand with RW. Pt able to stand for 2-4 minute windows before needing seated rest. Instruction in both sitting/standing positions on diaphragmatic breathing with one or both hands on stomach. 02 sats in standing 84-91%. Bumped him up to 6L fi02 35% on portable tank after the first few stands due to difficultly with increasing sats. He then reported urgently needing to void bladder. CGA and vcs for stand step transfer to the [facility] using device. 02 sats post transfer 97%! Pt with B+B void. Perihygiene completed with Mod A and pt able to elevate clothing with CGA. Vcs for slowing pace and mindful breathing during stands/transfers. Pt very rushed with transferring back to the recliner. 02 sats 98-99% afterwards. For remainder of session, transitioned therapeutic focus on improving functional respiratory control/endurance. Pt sang along to familiar songs of his choosing, vcs for protruding sternum to allow for improved chest expansion. Cues for  retracted and relaxed shoulders as well. At end of session pt remained sitting up in the recliner, all needs within reach and chair alarm set, satting at 93% on 5L via trach collar, hooked back up to the room 02.   Therapy Documentation Precautions:  Precautions Precautions: Cervical,Back Precaution Booklet Issued: Yes (comment) Precaution Comments: trach collar, TLSO don with rolling if HOB >30,  abdominal binder to protect PEG, c-collar d/c'ed 5/24 Required Braces or Orthoses: Spinal Brace Cervical Brace: Hard collar,At all times Spinal Brace: Thoracolumbosacral orthotic,Applied in supine position Restrictions Weight Bearing Restrictions: No Vital Signs: Therapy Vitals Temp: 98 F (36.7 C) Temp Source: Oral Pulse Rate: 85 Resp: 20 BP: (!) 128/95 Patient Position (if appropriate): Sitting Oxygen Therapy SpO2: 93 % O2 Device: Tracheostomy Collar O2 Flow Rate (L/min): 5 L/min Pain: in Rt hand with functional use, pt using his Rt hand per pain tolerance, set him up with bimanual task vs exclusively Rt handed task Pain Assessment Pain Scale: 0-10 Pain Score: 7  Faces Pain Scale: Hurts even more Pain Type: Acute pain Pain Location: Arm Pain Orientation: Right Pain Descriptors / Indicators: Aching;Discomfort ADL: ADL Eating: Supervision/safety Where Assessed-Eating: Bed level Grooming: Supervision/safety Where Assessed-Grooming: Bed level Upper Body Bathing: Minimal assistance Where Assessed-Upper Body Bathing: Bed level Lower Body Bathing: Moderate assistance Where Assessed-Lower Body Bathing: Edge of bed Upper Body Dressing: Not assessed Lower Body Dressing: Not assessed Toileting: Moderate assistance Where Assessed-Toileting: Teacher, adult education: Moderate assistance Toilet Transfer Method: Ambulating Tub/Shower Transfer: Unable to assess Film/video editor: Minimal assistance  Therapy/Group: Individual Therapy  Shawni Volkov A Deklynn Charlet 10/12/2020, 12:42  PM

## 2020-10-12 NOTE — Progress Notes (Signed)
PROGRESS NOTE   Subjective/Complaints: Experiencing a little cough. Fair sleep. C/o pain in right arm,wrist/hand---aching  ROS: Patient denies fever, rash, sore throat, blurred vision, nausea, vomiting, diarrhea, cough, shortness of breath or chest pain, joint or back pain, headache, or mood change.   Objective:   No results found. Recent Labs    10/09/20 1842 10/10/20 0507  WBC 10.5 7.4  HGB 12.0* 12.0*  HCT 37.9* 38.2*  PLT 320 193   Recent Labs    10/09/20 1842 10/10/20 0623  NA  --  139  K  --  3.9  CL  --  100  CO2  --  30  GLUCOSE  --  119*  BUN  --  17  CREATININE 0.80 0.85  CALCIUM  --  9.4    Intake/Output Summary (Last 24 hours) at 10/12/2020 1010 Last data filed at 10/12/2020 0900 Gross per 24 hour  Intake 670 ml  Output 2100 ml  Net -1430 ml        Physical Exam: Vital Signs Blood pressure 125/87, pulse 80, temperature 98 F (36.7 C), resp. rate 18, height 5\' 8"  (1.727 m), weight 80.5 kg, SpO2 93 %.  Constitutional: No distress . Vital signs reviewed. HEENT: EOMI, oral membranes moist Neck: trach #6 pmv Cardiovascular: RRR without murmur. No JVD    Respiratory/Chest: CTA Bilaterally without wheezes or rales. occ rhonchi. Normal effort    GI/Abdomen: BS +, non-tender, non-distended Ext: no clubbing, cyanosis, or edema Psych: pleasant and cooperative Skin: PEG site clean, foam dressing around neck Neuro: alert. Follows commands. Fair awareness. improved processing. UE's 3-4/5. LE 3-4/5. no focal sensory deficits. No resting tone.  good sitting balance Musculoskeletal: right shoulder and back TTP and with ROM. Right hand ?tender but without any swelling, warmth, bruising, etc. CTO fitting appropriately    Assessment/Plan: 1. Functional deficits which require 3+ hours per day of interdisciplinary therapy in a comprehensive inpatient rehab setting.  Physiatrist is providing close team  supervision and 24 hour management of active medical problems listed below.  Physiatrist and rehab team continue to assess barriers to discharge/monitor patient progress toward functional and medical goals  Care Tool:  Bathing    Body parts bathed by patient: Right arm,Left arm,Chest,Abdomen,Front perineal area,Buttocks,Right upper leg,Left upper leg,Face   Body parts bathed by helper: Left lower leg,Right lower leg     Bathing assist Assist Level: Moderate Assistance - Patient 50 - 74%     Upper Body Dressing/Undressing Upper body dressing   What is the patient wearing?: Hospital gown only    Upper body assist Assist Level: Minimal Assistance - Patient > 75%    Lower Body Dressing/Undressing Lower body dressing      What is the patient wearing?: Hospital gown only     Lower body assist       Toileting Toileting    Toileting assist Assist for toileting: Moderate Assistance - Patient 50 - 74%     Transfers Chair/bed transfer  Transfers assist     Chair/bed transfer assist level: Minimal Assistance - Patient > 75%     Locomotion Ambulation   Ambulation assist      Assist level: Minimal  Assistance - Patient > 75% Assistive device: Hand held assist Max distance: 10   Walk 10 feet activity   Assist     Assist level: Minimal Assistance - Patient > 75% Assistive device: Hand held assist   Walk 50 feet activity   Assist    Assist level: 2 helpers Assistive device: Hand held assist    Walk 150 feet activity   Assist Walk 150 feet activity did not occur: Safety/medical concerns         Walk 10 feet on uneven surface  activity   Assist Walk 10 feet on uneven surfaces activity did not occur: Safety/medical concerns         Wheelchair     Assist Will patient use wheelchair at discharge?: No             Wheelchair 50 feet with 2 turns activity    Assist            Wheelchair 150 feet activity     Assist           Blood pressure 125/87, pulse 80, temperature 98 F (36.7 C), resp. rate 18, height 5\' 8"  (1.727 m), weight 80.5 kg, SpO2 93 %.  Medical Problem List and Plan: 1.Debilitysecondary to fall with 3 column T8 vertebral body fracture/T7 neural arch fracture/right TVP T4-8/right C7 transverse process fracture through the foramen. CTO to be applied in supine position -patient may shower when has replacement pads for CTO -ELOS/Goals: ~2-2.5 weeks- supervision to min A  -Patient is beginning CIR therapies today including PT, OT, and SLP  2. Antithrombotics: -DVT/anticoagulation:Lovenox.    -vascular study negative -antiplatelet therapy: Aspirin 81 mg daily 3. Pain Management:Oxycodone as needed  -?mild neuropathic pain RUE. ?C7 radic---add gabapentin 100mg  tid 4. Mood:Wellbutrin 300 mg daily, BuSpar 10 mg 3 times daily, Klonopin 0.5 mg nightly, -antipsychotic agents: Seroquel 150 mg twice daily and 300 mg nightly  -recording sleep chart  -behavior non-agitated at this time 5. Neuropsych: This patientis notcapable of making decisions on hisown behalf. 6. Skin/Wound Care:Routine skin checks 7. Fluids/Electrolytes/Nutrition:encourage PO, TF as below   -recent lab work is within normal limits.   8. Acute blood loss anemia. Follow-up CBC 9. Multiple bilateral rib fractures. Conservative care 10. 3: T8 vertebral body fracture. Follow-up neurosurgery Dr. . Nonoperative.   -CTO for HOB>30 degrees at this point 11. T7 neural arch fracture. Follow-up neurosurgery 12. Right TVP T4-8. Pain control follow neurosurgery 13. Right C7 TVP fracture through foramen. CTA negative. CTO 14. Large scalp laceration right eyebrow laceration. Repaired 08/17/2020. Sutures removed 15. Hypertension. Lopressor 25 mg twice daily 16. CAD/MI/DES. Continue aspirin 17. Acute hypercarbic ventilatory dependent respiratory  failure/COPD. Tracheostomy 09/03/2020 per Dr. 10/17/2020. Changed back to #6 cuffless 10/05/2020 due to secretions. Patient will follow up long-term with Dr. Janee Morn  -tolerating PMV currently, secretions are minimal  -will ask PCCM to follow up next week if he continues to do well 18. Dysphagia. Gastrostomy tube 09/03/2020 per Dr. Sherene Sires.   -Dysphagia #2 thin liquids--advance as tolerated 19. BPH. Continue Hytrin/Flomax/Urecholine---really on near max doses   -still retaining: 700-900cc at times   -in/out caths q6 hours until he empties/voids.  -oob to void for all attempts 20. History of alcohol tobacco abuse. Provide counseling 21. Constipation---sorbitol 5/25 with results  -scheduled senna-s    LOS: 3 days A FACE TO FACE EVALUATION WAS PERFORMED  Janee Morn 10/12/2020, 10:10 AM

## 2020-10-12 NOTE — Progress Notes (Signed)
Speech Language Pathology Daily Session Note  Patient Details  Name: Charles Weeks MRN: [id number] Date of Birth: Sep 03, 1965  Today's Date: 10/12/2020 SLP Individual Time: 0900-0940 SLP Individual Time Calculation (min): 40 min  Short Term Goals: Week 1: SLP Short Term Goal 1 (Week 1): Patient will consume Dys 3 solids trials without significant changes in vitals (oxygen saturations, etc) and without overt s/s aspiration or penetration. SLP Short Term Goal 2 (Week 1): Patient will perform basic level medication and money management tasks with minA cues. SLP Short Term Goal 3 (Week 1): Patient will demonstrate awareness to deficits and impact on his function during simulated tasks with minA. SLP Short Term Goal 4 (Week 1): Patient will demonstrate basic level alternating attention during functional tasks with min-modA cues. SLP Short Term Goal 5 (Week 1): Patient will maintain adequate vocal intensity and speech intelligibility (at 85% intelligible) during semi-structured conversations, with min-modA cues.  Skilled Therapeutic Interventions: Skilled treatment session focused on dysphagia and cognitive goals. Upon arrival, patient reports he was tired and required encouragement for participation. The PMSV was in place throughout session with O2 saturations remaining between 83-90% with patient reporting intermittent shortness of breath. Patient did not want the PMSV removed and continued to demonstrate intermittent increased work of breathing. RN and PA aware. Patient declined breakfast again but consumed ensure without overt s/s of aspiration. Patient also participated in a basic money management task with supervision level verbal cues. Patient transferred to the recliner at end of session. Patient left with alarm on and all needs within reach. Continue with current plan of care.      Pain Pain Assessment Pain Scale: 0-10 Pain Score: 7  Faces Pain Scale: Hurts even more Pain Type: Acute  pain Pain Location: Arm Pain Orientation: Right Pain Descriptors / Indicators: Aching;Discomfort  Therapy/Group: Individual Therapy  Alex Leahy 10/12/2020, 12:54 PM

## 2020-10-12 NOTE — Progress Notes (Signed)
Inpatient Rehabilitation Care Coordinator Assessment and Plan Patient Details  Name: Charles Weeks MRN: [id number] Date of Birth: [birth year]  Today's Date: 10/12/2020  Hospital Problems: Principal Problem:   Thoracic spine fracture [facility]) Active Problems:   Urinary retention due to benign prostatic hyperplasia   Tracheostomy in place [facility])  Past Medical History:  Past Medical History:  Diagnosis Date  . Anxiety   . Anxiety 01/19/2020  . Arthritis   . Benign prostatic hyperplasia with weak urinary stream 12/08/2019  . BPH (benign prostatic hyperplasia) 08/17/2020  . CAD (coronary artery disease) 08/17/2020  . Chronic respiratory failure with hypoxia ([place]) 01/24/2020   Has home 02 but not using as of 01/24/2020  -  01/24/2020   Walked RA  approx   200 ft  @ slow pace  stopped due to  Dizzy with sats still 95%     . Cigarette smoker 01/24/2020   Stopped regular smoking 10/2019 but still "now and then" as of 01/24/2020   . COPD (chronic obstructive pulmonary disease) ([place])   . COPD (chronic obstructive pulmonary disease) ([place]) 08/17/2020   gold  . COPD GOLD ?     Quit smoking 10/2019  - Labs ordered 01/24/2020  :  allergy profile   alpha one AT phenotype   - 01/24/2020  After extensive coaching inhaler device,  effectiveness =    75% (short Ti) > change symb 80 to 160 2bid     . Coronary artery disease 06/08/2018   DES to RCA, [facility] [place])  . Depression   . Depression, major, single episode, moderate ([place]) 01/19/2020  . Encounter for screening for malignant neoplasm of colon 12/08/2019  . Essential hypertension   . Essential hypertension 12/08/2019  . History of MI (myocardial infarction) 01/19/2020  . HTN (hypertension) 08/17/2020  . Myocardial infarct ([place])   . Myocardial infarct ([place])   . Respiratory failure with hypoxia [facility])    Past Surgical History:  Past Surgical History:  Procedure Laterality Date  . BOWEL RESECTION  2006  . PEG PLACEMENT N/A 09/03/2020    Procedure: PERCUTANEOUS ENDOSCOPIC GASTROSTOMY (PEG) PLACEMENT;  Surgeon: Georganna Skeans, MD;  Location: [place];  Service: General;  Laterality: N/A;  . PERCUTANEOUS TRACHEOSTOMY N/A 09/03/2020   Procedure: PERCUTANEOUS TRACHEOSTOMY USING 6 SHILEY;  Surgeon: Georganna Skeans, MD;  Location: [place];  Service: General;  Laterality: N/A;  . SKIN GRAFT    . TONSILLECTOMY AND ADENOIDECTOMY     Social History:  reports that he has been smoking. He has never used smokeless tobacco. He reports current alcohol use. He reports previous drug use.  Family / Support Systems Marital Status: Divorced How Long?: since 2010 Patient Roles: Partner Spouse/Significant Other: Divorced Children: two adult daughters that live in [place] Other Supports: None reported Anticipated Caregiver: sister [name] Ability/Limitations of Caregiver: None reported Caregiver Availability: 24/7 Family Dynamics: Pt and sister live together.  Social History Preferred language: English Religion:  Cultural Background: Pt worked in [place] [employer] for about a1 year until lay off in 2009/2010 Education: 7th grade Read: Yes Write: Yes Employment Status: Disabled Date Retired/Disabled/Unemployed: SSDi begain in 2021 Legal History/Current Legal Issues: incident 30 years ago Guardian/Conservator: N/A   Abuse/Neglect Abuse/Neglect Assessment Can Be Completed: Yes Physical Abuse: Denies Verbal Abuse: Denies Sexual Abuse: Denies Exploitation of patient/patient's resources: Denies Self-Neglect: Denies  Emotional Status Pt's affect, behavior and adjustment status: Pt in good spirits at time of visit. Recent Psychosocial Issues: anxiety/depression. medications prescribed by PCP. no MH  hospitalizations. Psychiatric History: see above Substance Abuse History: quit smoking cigerettes at time of admission; rare EtoH use. Denies rec use.  Patient / Family Perceptions, Expectations & Goals Pt/Family understanding of  illness & functional limitations: Pt and pt sister Loletha Carrow has general understanding of pt care needs Premorbid pt/family roles/activities: Independent Anticipated changes in roles/activities/participation: Assistance with ADLs/IADLs Pt/family expectations/goals: Pt goal is "breathing,walking, and R Arm better funciton, and get out of here."  [employer]: None Premorbid Home Care/DME Agencies: None Transportation available at discharge: sister Financial risk analyst referrals recommended: Neuropsychology  Discharge Planning Living Arrangements: Other relatives Support Systems: Other relatives Type of Residence: Private residence Insurance underwriter Resources: Multimedia programmer (specify),Medicaid (specify county) ([place]; [place]) Financial Resources: SSD Financial Screen Referred: No Living Expenses: Own Money Management: Other (Comment) (pt sister Animal nutritionist) Does the patient have any problems obtaining your medications?: No Home Management: Pt sister manages all home care needs Patient/Family Preliminary Plans: No changes Care Coordinator Barriers to Discharge: Decreased caregiver support,Lack of/limited family support Care Coordinator Anticipated Follow Up Needs: HH/OP Expected length of stay: 2-2.5 weeks  Clinical Impression SW met with pt and pt sister [name] in room to introduce self, explain role, and discuss discharge process. Marland Kitchen HCPOA sister [name]. Not a veteran. DME: home o2 and nebulizer with [employer]. Pt sister reports she is comfortable with managing pt trach care needs at discharge.   Derward Marple A Suhayla Chisom 10/12/2020, 1:11 PM

## 2020-10-12 NOTE — Progress Notes (Signed)
Physical Therapy Session Note  Patient Details  Name: Charles Weeks MRN: [id number] Date of Birth: 1965/07/20  Today's Date: 10/12/2020 PT Individual Time: 1450-1530 PT Individual Time Calculation (min): 40 min   Short Term Goals: Week 1:  PT Short Term Goal 1 (Week 1): Pt will perform sit to stand with CGA PT Short Term Goal 2 (Week 1): Pt will perform bed to chair transfer with CGA. PT Short Term Goal 3 (Week 1): Pt will ambulate 100' with CGA and LRAD.  Skilled Therapeutic Interventions/Progress Updates:     Pt received seated at EOB and agrees to therapy, through reports significant fatigue and difficulty breathing. Pt's sats 88% on 6L O2 trach collar with PMSV. Stand step transfer to [facility] with minA. WC transport to gym for time management. Pt performs stand step transfer to Nustep with minA HHA. Pt completes Nustep for strength and endurance training. PT monitors vitals during activity. Pt desats to low 80s and cued to take rest breaks as needed. Pt ends up performing 5 bouts of 1:00 each at workload of 4, with extended seated rest breaks between each bout for recovery, and sats slowly returning to low 90s. Pt verbalizes increased discomfort and dyspnea and noted to be wheezing with wet respirations. Stand step from Nustep>WC>bed with minA. Pt left seated at EOB. Pt is able to cough up some phlegm while seated EOB. RN notified and present to provide breathing treatment.  Therapy Documentation Precautions:  Precautions Precautions: Cervical,Back Precaution Booklet Issued: Yes (comment) Precaution Comments: trach collar, TLSO don with rolling if HOB >30,  abdominal binder to protect PEG, c-collar d/c'ed 5/24 Required Braces or Orthoses: Spinal Brace Cervical Brace: Hard collar,At all times Spinal Brace: Thoracolumbosacral orthotic,Applied in supine position Restrictions Weight Bearing Restrictions: No   Therapy/Group: Individual Therapy  Beau Fanny, PT, DPT 10/12/2020, 5:08 PM

## 2020-10-12 NOTE — IPOC Note (Signed)
Overall Plan of Care [facility]) Patient Details Name: Charles Weeks MRN: [id number] DOB: [birth year]  Admitting Diagnosis: Thoracic spine fracture (HCC), TBI vs anoxic injury  Hospital Problems: Principal Problem:   Thoracic spine fracture (HCC) Active Problems:   Urinary retention due to benign prostatic hyperplasia   Tracheostomy in place [id number] [facility])     Functional Problem List: Nursing Bladder,Bowel,Pain,Safety,Medication Management,Nutrition,Skin Integrity  PT Balance,Behavior,Endurance,Motor,Pain,Perception,Safety  OT Balance,Cognition,Endurance,Motor,Pain,Safety  SLP Cognition,Safety,Nutrition,Perception  TR         Basic ADL's: OT Bathing,Dressing,Toileting     Advanced  ADL's: OT       Transfers: PT Bed Mobility,Bed to Chair,Car,Furniture  OT Toilet,Tub/Shower     Locomotion: PT Ambulation,Stairs     Additional Impairments: OT Fuctional Use of Upper Extremity  SLP Swallowing,Communication,Social Cognition expression Social Interaction,Problem Solving,Memory,Attention,Awareness  TR      Anticipated Outcomes Item Anticipated Outcome  Self Feeding no goal set  Swallowing  mod I   Basic self-care  supervision  Toileting  supervision   Bathroom Transfers supervision  Bowel/Bladder  manage bowel with mod I and bladder with mod I assist  Transfers  Supervision  Locomotion  Supervision  Communication  mod I  Cognition  mod I basic, supervision mild complex  Pain  pain at or below level 4  Safety/Judgment  maintain safety with cues/reminders   Therapy Plan: PT Intensity: Minimum of 1-2 x/day ,45 to 90 minutes PT Frequency: 5 out of 7 days PT Duration Estimated Length of Stay: 2-2.5 weeks OT Intensity: Minimum of 1-2 x/day, 45 to 90 minutes OT Frequency: 5 out of 7 days OT Duration/Estimated Length of Stay: 2 weeks SLP Intensity: Minumum of 1-2 x/day, 30 to 90 minutes SLP Frequency: 3 to 5 out of 7 days SLP Duration/Estimated Length of Stay: 2 weeks    Due to the current state of emergency, patients may not be receiving their 3-hours of Medicare-mandated therapy.   Team Interventions: Nursing Interventions Patient/Family Education,Discharge Planning,Skin Care/Wound Management,Pain Management,Bladder Management,Bowel Management,Medication Counselling psychologist Management/Prevention  PT interventions Ambulation/gait training,Community Teaching laboratory technician re-education,Psychosocial support,Stair training,UE/LE Strength taining/ROM,Wheelchair propulsion/positioning,Balance/vestibular training,Discharge planning,Functional electrical stimulation,Pain management,Skin care/wound management,Therapeutic Activities,UE/LE Coordination activities,Cognitive remediation/compensation,Disease management/prevention,Functional mobility training,Patient/family education,Splinting/orthotics,Therapeutic Exercise,Visual/perceptual remediation/compensation  OT Interventions Balance/vestibular training,DME/adaptive equipment instruction,Patient/family education,Therapeutic Activities,Therapeutic Exercise,Psychosocial support,Cognitive remediation/compensation,Community reintegration,Functional mobility training,Self Care/advanced ADL retraining,UE/LE Strength taining/ROM,UE/LE Coordination activities,Discharge planning,Neuromuscular re-education,Pain management  SLP Interventions Cognitive remediation/compensation,Dysphagia/aspiration precaution training,Medication managment,Speech/Language facilitation,Patient/family education,Functional tasks  TR Interventions    SW/CM Interventions Discharge Planning,Psychosocial Support,Patient/Family Education   Barriers to Discharge MD  Medical stability  Nursing Decreased caregiver support,Home environment access/layout,Nutrition means,Trach,Insurance for SNF coverage lived w sister 2 level 3 ste, 11 steps upstairs; B+B on main level PTA  PT      OT  Decreased caregiver support unclear if family is home 24/7  SLP Decreased caregiver support patient reports he lives with his sister  SW       Team Discharge Planning: Destination: PT-Home ,OT- Home , SLP-Home Projected Follow-up: PT-24 hour supervision/assistance,Home health PT, OT-  Home health OT, SLP-Home Health SLP,24 hour supervision/assistance Projected Equipment Needs: PT-To be determined, OT- To be determined, SLP-None recommended by SLP Equipment Details: PT- , OT-  Patient/family involved in discharge planning: PT- Patient,  OT-Patient, SLP-Patient  MD ELOS: 2 to 2.5 weeks Medical Rehab Prognosis:  Excellent Assessment: The patient has been admitted for CIR therapies with the diagnosis of T8 fx, polytrauma with TBI/ABI. The team will be addressing functional mobility, strength, stamina, balance, safety, adaptive techniques and equipment, self-care, bowel and  bladder mgt, patient and caregiver education, NMR, cognition, don/doffing of brace, pain control, communication, swallowing. Goals have been set at mod I with SLP and supervision for mobility and self-care.   Due to the current state of emergency, patients may not be receiving their 3 hours per day of Medicare-mandated therapy.    Ranelle Oyster, MD, FAAPMR      See Team Conference Notes for weekly updates to the plan of care

## 2020-10-13 LAB — GLUCOSE, CAPILLARY
Glucose-Capillary: 121 mg/dL — ABNORMAL HIGH (ref 70–99)
Glucose-Capillary: 136 mg/dL — ABNORMAL HIGH (ref 70–99)
Glucose-Capillary: 144 mg/dL — ABNORMAL HIGH (ref 70–99)
Glucose-Capillary: 166 mg/dL — ABNORMAL HIGH (ref 70–99)
Glucose-Capillary: 167 mg/dL — ABNORMAL HIGH (ref 70–99)
Glucose-Capillary: 172 mg/dL — ABNORMAL HIGH (ref 70–99)
Glucose-Capillary: 193 mg/dL — ABNORMAL HIGH (ref 70–99)

## 2020-10-13 NOTE — Progress Notes (Signed)
Speech Language Pathology TBI Note  Patient Details  Name: Volney Reierson MRN: [id number] Date of Birth: 1966/04/30  Today's Date: 10/13/2020 SLP Individual Time: 1130-1200 SLP Individual Time Calculation (min): 30 min  Short Term Goals: Week 1: SLP Short Term Goal 1 (Week 1): Patient will consume Dys 3 solids trials without significant changes in vitals (oxygen saturations, etc) and without overt s/s aspiration or penetration. SLP Short Term Goal 2 (Week 1): Patient will perform basic level medication and money management tasks with minA cues. SLP Short Term Goal 3 (Week 1): Patient will demonstrate awareness to deficits and impact on his function during simulated tasks with minA. SLP Short Term Goal 4 (Week 1): Patient will demonstrate basic level alternating attention during functional tasks with min-modA cues. SLP Short Term Goal 5 (Week 1): Patient will maintain adequate vocal intensity and speech intelligibility (at 85% intelligible) during semi-structured conversations, with min-modA cues.  Skilled Therapeutic Interventions: Pt seen for skilled ST with focus on dysphagia and cognitive goals. Pt agreeable to minimal Dys 3 trials, mildly prolonged mastication and decreased bolus cohesion noted. Pt independent for use of thin liquid wash to clear min oral stasis. No overt s/s aspiration, O2 stats remain 88-93% during PO. Pt reports ongoing expectoration of phlegm, no complaints of shortness of breath during tx session. Provided min A cues, pt completing mildly complex problem solving task for home environment with emphasis on awareness of how physical and cognitive deficits will impact daily routine. Pt adamant about smoking cessation. Pt left in bed with alarm set and all needs met. Cont ST POC.   Pain Pain Assessment Pain Scale: 0-10 Pain Score: 0-No pain Pain Type: Acute pain Pain Location: Arm Pain Orientation: Right Pain Descriptors / Indicators: Aching Pain Frequency:  Intermittent Pain Onset: On-going Pain Intervention(s): Medication (See eMAR)  Agitated Behavior Scale: TBI Observation Details Observation Environment: Patient's room Start of observation period - Date: 10/13/20 Start of observation period - Time: 1130 End of observation period - Date: 10/13/20 End of observation period - Time: 1200 Agitated Behavior Scale (DO NOT LEAVE BLANKS) Short attention span, easy distractibility, inability to concentrate: Absent Impulsive, impatient, low tolerance for pain or frustration: Absent Uncooperative, resistant to care, demanding: Absent Violent and/or threatening violence toward people or property: Absent Explosive and/or unpredictable anger: Absent Rocking, rubbing, moaning, or other self-stimulating behavior: Absent Pulling at tubes, restraints, etc.: Absent Wandering from treatment areas: Absent Restlessness, pacing, excessive movement: Absent Repetitive behaviors, motor, and/or verbal: Absent Rapid, loud, or excessive talking: Absent Sudden changes of mood: Absent Easily initiated or excessive crying and/or laughter: Absent Self-abusiveness, physical and/or verbal: Absent Agitated behavior scale total score: 14  Therapy/Group: Individual Therapy  Dewaine Conger 10/13/2020, 11:57 AM

## 2020-10-13 NOTE — Progress Notes (Signed)
Physical Therapy Session Note  Patient Details  Name: Charles Weeks MRN: [id number] Date of Birth: 1966/03/03    Short Term Goals: Week 1:  PT Short Term Goal 1 (Week 1): Pt will perform sit to stand with CGA PT Short Term Goal 2 (Week 1): Pt will perform bed to chair transfer with CGA. PT Short Term Goal 3 (Week 1): Pt will ambulate 100' with CGA and LRAD.  Skilled Therapeutic Interventions/Progress Updates:     Pt supine upon PT arrival. Requests to rest for "several minutes" prior to therapy due to just finishing breakfast and feeling fatigued. Pt agreeable upon PT followup. No compliant of pain. Pt noted to be sitting with HOB 49 degrees and without back brace on. PT educates pt on importance of donning brace in supine prior to raising HOB >30 degrees. Pt rolls bilaterally with supervision and PT dons TLSO. Supine to sit with cues on sequencing. Sit to stand and stand step transfer to [facility] with minA and no AD. Pt on 6L O2 via trach collar. Pt ambulates 100' x2 with extended seated rest break and minA. O2 sats drop to 93% following ambulation, though pt appears to be dypneic. Pt performs 1x20 LAQs with 5lb ankles weights. Pt then performs standing marches, bringing hips to 90 degrees flexion, with ankle weights and PT providing minA for standing balance, 1x20 for hip flexor strengthening and balance training. Pt performs additional 1x15 LAQs. WC transport back to room. Pt verbalizes desire to try to urinate. NT educated on pt mobility status. Pt then performs stand pivot transfer to toilet with CGA. Left seated on toilet with RN staff present.  2nd Session: Pt seated in recliner and requests to defer therapy at this time due to fatigue and dyspnea. PT will follow up as appropriate.  Therapy Documentation Precautions:  Precautions Precautions: Cervical,Back Precaution Booklet Issued: Yes (comment) Precaution Comments: trach collar, TLSO don with rolling if HOB >30,  abdominal binder to  protect PEG, c-collar d/c'ed 5/24 Required Braces or Orthoses: Spinal Brace Cervical Brace: Hard collar,At all times Spinal Brace: Thoracolumbosacral orthotic,Applied in supine position Restrictions Weight Bearing Restrictions: No    Therapy/Group: Individual Therapy  Beau Fanny, PT, DPT 10/13/2020, 10:03 AM

## 2020-10-13 NOTE — Progress Notes (Addendum)
Occupational Therapy Session Note  Patient Details  Name: Charles Weeks MRN: [id number] Date of Birth: 06/27/1965  Today's Date: 10/13/2020 OT Individual Time: 1335-1430 OT Individual Time Calculation (min): 55 min   Short Term Goals: Week 1:  OT Short Term Goal 1 (Week 1): Pt will complete toileting tasks with min A OT Short Term Goal 2 (Week 1): Pt will transfer to toilet with min A OT Short Term Goal 3 (Week 1): Pt will instruct caregiver on donning TLSO with min cueing OT Short Term Goal 4 (Week 1): Pt will use LRAD to wash LB with min A  Skilled Therapeutic Interventions/Progress Updates:    Pt greeted in bed with no c/o pain, c/o secretions that he was unable to clear. He rolled Rt>Lt with Min A in order for OT to don TLSO. Supine<sit completed with vcs for back precautions. Pt on 5L 02 at start of session via trach collar, satting at 90-91%. Encouraged pt to use diaphragmatic breathing strategies throughout session. Started with face washing given setup assistance. He declined using the [facility], said he felt a bit more "out of breath" than he did earlier. Bumped him up to 6L on portable 02, fi02 35% and then he stood for ~2 minutes 25 sec with CGA, once again cues for deep breathing with hands on stomach to promote belly rise + lateral chest expansion. Pts 02 fluctuated between 84-91% while standing. He returned to sitting again and OT educated him on how to use reacher + sock aide for increasing functional independence with LB self care. Pt easily frustrated when attempting to use AE. OT encouraged him multiple times and utlilized backwards chaining methods to increase his success. Pt adamant that he does not like AE and does not plan to use it. Pt able to doff/don gripper socks by drawing each foot closer to his body, though pt visibly holding his breath, turning red, and straining. OT educated him that using AE would increase his breathing safety, also strongly educated him to breath out with  exertion and to coordinate breath with movement at all times so that he doesn't increase SOB. Pts 02 sats during EOB activity were mostly in the 85-88% range, assessed with continuous 02 monitor + dynamap for comparison. Pt also noted to be wheezing. CGA for stand pivot<recliner without AD. Notified RN of pts 02 sats/condition. Pt left in care of RN who was planning to call respiratory. All needs within reach and chair alarm set prior to OT departure.   Therapy Documentation Precautions:  Precautions Precautions: Cervical,Back Precaution Booklet Issued: Yes (comment) Precaution Comments: trach collar, TLSO don with rolling if HOB >30,  abdominal binder to protect PEG, c-collar d/c'ed 5/24 Required Braces or Orthoses: Spinal Brace Cervical Brace: Hard collar,At all times Spinal Brace: Thoracolumbosacral orthotic,Applied in supine position Restrictions Weight Bearing Restrictions: No Vital Signs: Therapy Vitals Temp: 98.2 F (36.8 C) Temp Source: Oral Pulse Rate: 91 Resp: 17 BP: 105/82 Patient Position (if appropriate): Lying Oxygen Therapy SpO2: 91 % O2 Device: Tracheostomy Collar O2 Flow Rate (L/min): 8 L/min FiO2 (%): 35 % Pain: Pain Assessment Pain Scale: 0-10 Pain Score: 7  Pain Type: Acute pain Pain Location: Arm Pain Orientation: Right Pain Descriptors / Indicators: Aching Pain Frequency: Constant Pain Onset: On-going Pain Intervention(s): Medication (See eMAR) ADL: ADL Eating: Supervision/safety Where Assessed-Eating: Bed level Grooming: Supervision/safety Where Assessed-Grooming: Bed level Upper Body Bathing: Minimal assistance Where Assessed-Upper Body Bathing: Bed level Lower Body Bathing: Moderate assistance Where Assessed-Lower Body Bathing: Edge of  bed Upper Body Dressing: Not assessed Lower Body Dressing: Not assessed Toileting: Moderate assistance Where Assessed-Toileting: Teacher, adult education: Moderate assistance Toilet Transfer Method:  Ambulating Tub/Shower Transfer: Unable to assess Film/video editor: Minimal assistance      Therapy/Group: Individual Therapy  Prithvi Kooi A Ota Ebersole 10/13/2020, 4:00 PM

## 2020-10-13 NOTE — Progress Notes (Signed)
Patient O2 sat 83% ; per patient he had a hard time coughing out secretions; patient on q 4 hours Robitussin; RT called and he suctioned patient and placed patient to  02 at 8 L per trach instead of RA ; RT monitored patient 02 went up to 93-94%; family requesting more nebulizer treatment but RT said his HR is UP. Per patient he feels much better. Continued to monitor.

## 2020-10-13 NOTE — Progress Notes (Signed)
Patient claim he feels much better; 02 sat 97-98 at 10 liter per trach. Continued to monitor

## 2020-10-13 NOTE — Progress Notes (Signed)
PROGRESS NOTE   Subjective/Complaints: Slept fairly well again last night.   ROS: Patient denies fever, rash, sore throat, blurred vision, nausea, vomiting, diarrhea, cough, shortness of breath or chest pain, joint or back pain, headache, or mood change.    Objective:   No results found. No results for input(s): WBC, HGB, HCT, PLT in the last 72 hours. No results for input(s): NA, K, CL, CO2, GLUCOSE, BUN, CREATININE, CALCIUM in the last 72 hours.  Intake/Output Summary (Last 24 hours) at 10/13/2020 0947 Last data filed at 10/13/2020 0905 Gross per 24 hour  Intake 934 ml  Output 2060 ml  Net -1126 ml        Physical Exam: Vital Signs Blood pressure 117/65, pulse (!) 54, temperature 98.3 F (36.8 C), temperature source Oral, resp. rate 14, height 5\' 8"  (1.727 m), weight 80.5 kg, SpO2 92 %.  Constitutional: No distress . Vital signs reviewed. HEENT: EOMI, oral membranes moist Neck: supple, #6 trach with PMV Cardiovascular: RRR without murmur. No JVD    Respiratory/Chest: CTA Bilaterally without wheezes or rales. Normal effort    GI/Abdomen: BS +, non-tender, non-distended Ext: no clubbing, cyanosis, or edema Psych: pleasant and cooperative Skin: PEG site clean,  Neuro: alert. Follows commands. Fair awareness. improved processing. UE's 3-4/5. LE 3-4/5. no focal sensory deficits. No resting tone.  good sitting balance Musculoskeletal: right shoulder and back TTP and with ROM. RUE tender   Assessment/Plan: 1. Functional deficits which require 3+ hours per day of interdisciplinary therapy in a comprehensive inpatient rehab setting.  Physiatrist is providing close team supervision and 24 hour management of active medical problems listed below.  Physiatrist and rehab team continue to assess barriers to discharge/monitor patient progress toward functional and medical goals  Care Tool:  Bathing    Body parts bathed by  patient: Right arm,Left arm,Chest,Abdomen,Front perineal area,Buttocks,Right upper leg,Left upper leg,Face   Body parts bathed by helper: Left lower leg,Right lower leg     Bathing assist Assist Level: Moderate Assistance - Patient 50 - 74%     Upper Body Dressing/Undressing Upper body dressing   What is the patient wearing?: Hospital gown only    Upper body assist Assist Level: Minimal Assistance - Patient > 75%    Lower Body Dressing/Undressing Lower body dressing      What is the patient wearing?: Hospital gown only     Lower body assist       Toileting Toileting    Toileting assist Assist for toileting: Moderate Assistance - Patient 50 - 74%     Transfers Chair/bed transfer  Transfers assist     Chair/bed transfer assist level: Minimal Assistance - Patient > 75%     Locomotion Ambulation   Ambulation assist      Assist level: Minimal Assistance - Patient > 75% Assistive device: Hand held assist Max distance: 10   Walk 10 feet activity   Assist     Assist level: Minimal Assistance - Patient > 75% Assistive device: Hand held assist   Walk 50 feet activity   Assist    Assist level: 2 helpers Assistive device: Hand held assist    Walk 150 feet activity  Assist Walk 150 feet activity did not occur: Safety/medical concerns         Walk 10 feet on uneven surface  activity   Assist Walk 10 feet on uneven surfaces activity did not occur: Safety/medical concerns         Wheelchair     Assist Will patient use wheelchair at discharge?: No             Wheelchair 50 feet with 2 turns activity    Assist            Wheelchair 150 feet activity     Assist          Blood pressure 117/65, pulse (!) 54, temperature 98.3 F (36.8 C), temperature source Oral, resp. rate 14, height 5\' 8"  (1.727 m), weight 80.5 kg, SpO2 92 %.  Medical Problem List and Plan: 1.Debilitysecondary to fall with 3 column T8  vertebral body fracture/T7 neural arch fracture/right TVP T4-8/right C7 transverse process fracture through the foramen. CTO to be applied in supine position -patient may shower with CTO -ELOS/Goals: ~2-2.5 weeks- supervision to min A  -Continue CIR therapies including PT, OT, and SLP  2. Antithrombotics: -DVT/anticoagulation:Lovenox.    -vascular study negative -antiplatelet therapy: Aspirin 81 mg daily 3. Pain Management:Oxycodone as needed  -?mild neuropathic pain RUE. ?C7 radic---continue gabapentin 100mg  tid 4. Mood:Wellbutrin 300 mg daily, BuSpar 10 mg 3 times daily, Klonopin 0.5 mg nightly, -antipsychotic agents: Seroquel 150 mg twice daily and 300 mg nightly  -  sleep chart  -behavior has been pretty appropriate 5. Neuropsych: This patientis notcapable of making decisions on hisown behalf. 6. Skin/Wound Care:Routine skin checks 7. Fluids/Electrolytes/Nutrition:encourage PO, TF as below   -recent lab work is within normal limits.   8. Acute blood loss anemia. Follow-up CBC 9. Multiple bilateral rib fractures. Conservative care 10. 3: T8 vertebral body fracture. Follow-up neurosurgery Dr. . Nonoperative.   -CTO for HOB>30 degrees at this point 11. T7 neural arch fracture. Follow-up neurosurgery 12. Right TVP T4-8. Pain control follow neurosurgery 13. Right C7 TVP fracture through foramen. CTA negative. CTO 14. Large scalp laceration right eyebrow laceration. Repaired 08/17/2020. Sutures removed 15. Hypertension. Lopressor 25 mg twice daily 16. CAD/MI/DES. Continue aspirin 17. Acute hypercarbic ventilatory dependent respiratory failure/COPD. Tracheostomy 09/03/2020 per Dr. 10/17/2020. Changed back to #6 cuffless 10/05/2020 due to secretions. Patient will follow up long-term with Dr. Janee Morn  -tolerating PMV currently with minimal secretions  -will ask PCCM to follow up next week if he continues to do  well 18. Dysphagia. Gastrostomy tube 09/03/2020 per Dr. Sherene Sires.   -Dysphagia #2 thin liquids--advance as tolerated 19. BPH. Continue Hytrin/Flomax/Urecholine---really on near max doses   -still retaining: 500-800c --some improvement  -in/out caths q6 hours until he empties/voids.  -oob to void for all attempts 20. History of alcohol tobacco abuse. Provide counseling 21. Constipation---+bm 5/27  -scheduled senna-s    LOS: 4 days A FACE TO FACE EVALUATION WAS PERFORMED  Janee Morn 10/13/2020, 9:47 AM

## 2020-10-14 ENCOUNTER — Inpatient Hospital Stay (HOSPITAL_COMMUNITY): Payer: Medicaid Other

## 2020-10-14 LAB — CBC
HCT: 38.3 % — ABNORMAL LOW (ref 39.0–52.0)
Hemoglobin: 11.8 g/dL — ABNORMAL LOW (ref 13.0–17.0)
MCH: 28.2 pg (ref 26.0–34.0)
MCHC: 30.8 g/dL (ref 30.0–36.0)
MCV: 91.6 fL (ref 80.0–100.0)
Platelets: 351 10*3/uL (ref 150–400)
RBC: 4.18 MIL/uL — ABNORMAL LOW (ref 4.22–5.81)
RDW: 17.5 % — ABNORMAL HIGH (ref 11.5–15.5)
WBC: 11.9 10*3/uL — ABNORMAL HIGH (ref 4.0–10.5)
nRBC: 0 % (ref 0.0–0.2)

## 2020-10-14 LAB — GLUCOSE, CAPILLARY
Glucose-Capillary: 190 mg/dL — ABNORMAL HIGH (ref 70–99)
Glucose-Capillary: 120 mg/dL — ABNORMAL HIGH (ref 70–99)
Glucose-Capillary: 151 mg/dL — ABNORMAL HIGH (ref 70–99)
Glucose-Capillary: 160 mg/dL — ABNORMAL HIGH (ref 70–99)
Glucose-Capillary: 175 mg/dL — ABNORMAL HIGH (ref 70–99)

## 2020-10-14 MED ORDER — GLYCOPYRROLATE 1 MG PO TABS
1.0000 mg | ORAL_TABLET | Freq: Three times a day (TID) | ORAL | Status: DC
  Administered 2020-10-14 – 2020-10-31 (×52): 1 mg via ORAL
  Filled 2020-10-14 (×54): qty 1

## 2020-10-14 MED ORDER — AMOXICILLIN-POT CLAVULANATE 875-125 MG PO TABS
1.0000 | ORAL_TABLET | Freq: Two times a day (BID) | ORAL | Status: DC
  Administered 2020-10-14 – 2020-10-22 (×17): 1 via ORAL
  Filled 2020-10-14 (×17): qty 1

## 2020-10-14 MED ORDER — INSULIN ASPART 100 UNIT/ML ~~LOC~~ SOLN
0.0000 [IU] | Freq: Three times a day (TID) | SUBCUTANEOUS | Status: DC
  Administered 2020-10-14: 2 [IU] via SUBCUTANEOUS
  Administered 2020-10-14: 3 [IU] via SUBCUTANEOUS
  Administered 2020-10-15: 2 [IU] via SUBCUTANEOUS
  Administered 2020-10-15 (×2): 3 [IU] via SUBCUTANEOUS
  Administered 2020-10-15 – 2020-10-17 (×6): 2 [IU] via SUBCUTANEOUS

## 2020-10-14 NOTE — Progress Notes (Signed)
RN was called to room re: patient having a hard time breathing. RN assessed patient 02 sat 95 at 8Liter per trach HR 87; PMV removed and suctioned patient but no secretions. PMV placed back and patient requested water. He claims he feels much better.

## 2020-10-14 NOTE — Progress Notes (Signed)
Patient weaned 02 from 10 to 8 liter per trach per order ; pt 02 sat 96% ; pt tolerating.

## 2020-10-14 NOTE — Progress Notes (Signed)
CXR Reveals chronic changes c/w emphysema, interstitial prominence. WBC's up to 11.9k.  Given thicker more, increased secretions, will treat for a tracheobroncitis with augmentin. Will also check sputum sample. Recheck cbc in am. Continue per progress note from this morning.   Ranelle Oyster, MD, [facility] Physical Medicine & Rehabilitation 10/14/2020

## 2020-10-14 NOTE — Progress Notes (Signed)
Respiratory Therapist notified of respiratory  culture order

## 2020-10-14 NOTE — Plan of Care (Signed)
  Problem: RH BLADDER ELIMINATION Goal: RH STG MANAGE BLADDER WITH ASSISTANCE Description: STG Manage Bladder With  mod I Assistance Outcome: Not Progressing; I and o cath

## 2020-10-14 NOTE — Progress Notes (Signed)
PROGRESS NOTE   Subjective/Complaints: Pt didn't sleep as well last night because of secretions. Nurse reports that they were a little thicker and more difficult to mobilize. Required 10L oxygen--still on that this am. RN does feel that his waist belt and urine retention contributed to picture also. Pt feeling better this morning.  ROS: Patient denies fever, rash, sore throat, blurred vision, nausea, vomiting, diarrhea,  chest pain, joint or back pain, headache, or mood change. .    Objective:   No results found. No results for input(s): WBC, HGB, HCT, PLT in the last 72 hours. No results for input(s): NA, K, CL, CO2, GLUCOSE, BUN, CREATININE, CALCIUM in the last 72 hours.  Intake/Output Summary (Last 24 hours) at 10/14/2020 0815 Last data filed at 10/14/2020 0630 Gross per 24 hour  Intake 816 ml  Output 2150 ml  Net -1334 ml        Physical Exam: Vital Signs Blood pressure 126/79, pulse 92, temperature 97.8 F (36.6 C), temperature source Oral, resp. rate 18, height 5\' 8"  (1.727 m), weight 80.5 kg, SpO2 93 %.  Constitutional: No distress . Vital signs reviewed. HEENT: EOMI, oral membranes moist Neck: supple Cardiovascular: RRR without murmur. No JVD    Respiratory/Chest:  A few scattered rhonchi and upper airway sounds. No distress. Decreased sounds at bases.   GI/Abdomen: BS +, non-tender, non-distended Ext: no clubbing, cyanosis, or edema Psych: pleasant and cooperative Skin: PEG site with scant drainage   Neuro: alert. Follows commands. Fair awareness. improved processing. UE's 3-4/5. LE 3-4/5. no focal sensory deficits. No resting tone.  good sitting balance Musculoskeletal: right shoulder and back TTP and with ROM. RUE tender   Assessment/Plan: 1. Functional deficits which require 3+ hours per day of interdisciplinary therapy in a comprehensive inpatient rehab setting.  Physiatrist is providing close team  supervision and 24 hour management of active medical problems listed below.  Physiatrist and rehab team continue to assess barriers to discharge/monitor patient progress toward functional and medical goals  Care Tool:  Bathing    Body parts bathed by patient: Right arm,Left arm,Chest,Abdomen,Front perineal area,Buttocks,Right upper leg,Left upper leg,Face   Body parts bathed by helper: Left lower leg,Right lower leg     Bathing assist Assist Level: Moderate Assistance - Patient 50 - 74%     Upper Body Dressing/Undressing Upper body dressing   What is the patient wearing?: Hospital gown only    Upper body assist Assist Level: Minimal Assistance - Patient > 75%    Lower Body Dressing/Undressing Lower body dressing      What is the patient wearing?: Hospital gown only     Lower body assist       Toileting Toileting    Toileting assist Assist for toileting: Moderate Assistance - Patient 50 - 74%     Transfers Chair/bed transfer  Transfers assist     Chair/bed transfer assist level: Minimal Assistance - Patient > 75%     Locomotion Ambulation   Ambulation assist      Assist level: Minimal Assistance - Patient > 75% Assistive device: Hand held assist Max distance: 10   Walk 10 feet activity   Assist  Assist level: Minimal Assistance - Patient > 75% Assistive device: Hand held assist   Walk 50 feet activity   Assist    Assist level: 2 helpers Assistive device: Hand held assist    Walk 150 feet activity   Assist Walk 150 feet activity did not occur: Safety/medical concerns         Walk 10 feet on uneven surface  activity   Assist Walk 10 feet on uneven surfaces activity did not occur: Safety/medical concerns         Wheelchair     Assist Will patient use wheelchair at discharge?: No             Wheelchair 50 feet with 2 turns activity    Assist            Wheelchair 150 feet activity     Assist           Blood pressure 126/79, pulse 92, temperature 97.8 F (36.6 C), temperature source Oral, resp. rate 18, height 5\' 8"  (1.727 m), weight 80.5 kg, SpO2 93 %.  Medical Problem List and Plan: 1.Debilitysecondary to fall with 3 column T8 vertebral body fracture/T7 neural arch fracture/right TVP T4-8/right C7 transverse process fracture through the foramen. CTO to be applied in supine position -patient may shower with CTO -ELOS/Goals: ~2-2.5 weeks- supervision to min A  -Continue CIR therapies including PT, OT, and SLP   2. Antithrombotics: -DVT/anticoagulation:Lovenox.    -vascular study negative -antiplatelet therapy: Aspirin 81 mg daily 3. Pain Management:Oxycodone as needed  -?mild neuropathic pain RUE. ?C7 radic---continue gabapentin 100mg  tid 4. Mood:Wellbutrin 300 mg daily, BuSpar 10 mg 3 times daily, Klonopin 0.5 mg nightly, -antipsychotic agents: Seroquel 150 mg twice daily and 300 mg nightly  -  sleep chart  -behavior has been pretty appropriate 5. Neuropsych: This patientis notcapable of making decisions on hisown behalf. 6. Skin/Wound Care:Routine skin checks 7. Fluids/Electrolytes/Nutrition:    -recent lab work is within normal limits.   -eating well, DC TF 8. Acute blood loss anemia. Follow-up CBC 9. Multiple bilateral rib fractures. Conservative care 10. 3: T8 vertebral body fracture. Follow-up neurosurgery Dr. . Nonoperative.   -CTO for HOB>30 degrees at this point 11. T7 neural arch fracture. Follow-up neurosurgery 12. Right TVP T4-8. Pain control follow neurosurgery 13. Right C7 TVP fracture through foramen. CTA negative. CTO 14. Large scalp laceration right eyebrow laceration. Repaired 08/17/2020. Sutures removed 15. Hypertension. Lopressor 25 mg twice daily 16. CAD/MI/DES. Continue aspirin 17. Acute hypercarbic ventilatory dependent respiratory failure/COPD. Tracheostomy  09/03/2020 per Dr. 10/17/2020. Changed back to #6 cuffless 10/05/2020 due to secretions. Patient will follow up long-term with Dr. Janee Morn  -thicker secretions yesterday into today  -trial of robinul  -check cxr, cbc today  -oob, IS, FV 18. Dysphagia. Gastrostomy tube 09/03/2020 per Dr. Sherene Sires.   -Dysphagia #2 thin liquids--advance as tolerated 19. BPH. Continue Hytrin/Flomax/Urecholine---really on near max doses   -still retaining: 500-800c --some improvement  -in/out caths q6 hours until he empties/voids.  -oob to void for all attempts 20. History of alcohol tobacco abuse. Provide counseling 21. Constipation---+bm 5/28  -scheduled senna-s    LOS: 5 days A FACE TO FACE EVALUATION WAS PERFORMED  Janee Morn 10/14/2020, 8:15 AM

## 2020-10-15 DIAGNOSIS — S22000G Wedge compression fracture of unspecified thoracic vertebra, subsequent encounter for fracture with delayed healing: Secondary | ICD-10-CM

## 2020-10-15 LAB — CBC
HCT: 37.5 % — ABNORMAL LOW (ref 39.0–52.0)
Hemoglobin: 11.6 g/dL — ABNORMAL LOW (ref 13.0–17.0)
MCH: 28.5 pg (ref 26.0–34.0)
MCHC: 30.9 g/dL (ref 30.0–36.0)
MCV: 92.1 fL (ref 80.0–100.0)
Platelets: 321 10*3/uL (ref 150–400)
RBC: 4.07 MIL/uL — ABNORMAL LOW (ref 4.22–5.81)
RDW: 17.2 % — ABNORMAL HIGH (ref 11.5–15.5)
WBC: 9 10*3/uL (ref 4.0–10.5)
nRBC: 0 % (ref 0.0–0.2)

## 2020-10-15 LAB — GLUCOSE, CAPILLARY
Glucose-Capillary: 167 mg/dL — ABNORMAL HIGH (ref 70–99)
Glucose-Capillary: 145 mg/dL — ABNORMAL HIGH (ref 70–99)
Glucose-Capillary: 122 mg/dL — ABNORMAL HIGH (ref 70–99)
Glucose-Capillary: 152 mg/dL — ABNORMAL HIGH (ref 70–99)
Glucose-Capillary: 157 mg/dL — ABNORMAL HIGH (ref 70–99)

## 2020-10-15 NOTE — Progress Notes (Signed)
PROGRESS NOTE   Subjective/Complaints:  Sister at bedside has questions about CXR, antibiotics and Right shoulder weakness, no numbness or tingling in the RUE, no hand or arm weakness Reviewed Right shoulder xray 10/04/20  ROS- neg CP, COB, N/V?D Objective:   DG Chest 2 View  Result Date: 10/14/2020 CLINICAL DATA:  Cough EXAM: CHEST - 2 VIEW COMPARISON:  Oct 03, 2020, Oct 08, 2020 FINDINGS: The cardiomediastinal silhouette is unchanged in contour.Tracheostomy. Small bilateral pleural effusions. No pneumothorax. Mild diffuse interstitial prominence and peribronchial cuffing. G-tube. Multilevel degenerative changes of the thoracic spine with previously described compression fracture deformities of the lower thoracic spine better evaluated on recent CT. IMPRESSION: 1. Small bilateral pleural effusions. Diffuse interstitial prominence and peribronchial cuffing likely reflects underlying emphysema. Differential considerations include mild pulmonary edema. Electronically Signed   By: Meda Klinefelter MD   On: 10/14/2020 11:22   Recent Labs    10/14/20 1009 10/15/20 0715  WBC 11.9* 9.0  HGB 11.8* 11.6*  HCT 38.3* 37.5*  PLT 351 321   No results for input(s): NA, K, CL, CO2, GLUCOSE, BUN, CREATININE, CALCIUM in the last 72 hours.  Intake/Output Summary (Last 24 hours) at 10/15/2020 1234 Last data filed at 10/15/2020 0750 Gross per 24 hour  Intake 756 ml  Output 2375 ml  Net -1619 ml        Physical Exam: Vital Signs Blood pressure 127/83, pulse 87, temperature 98.2 F (36.8 C), temperature source Oral, resp. rate 18, height 5\' 8"  (1.727 m), weight 81.9 kg, SpO2 93 %.   General: No acute distress Mood and affect are appropriate Heart: Regular rate and rhythm no rubs murmurs or extra sounds Lungs: Clear to auscultation, breathing unlabored, no rales or wheezes Abdomen: Positive bowel sounds, soft nontender to palpation,  nondistended Extremities: No clubbing, cyanosis, or edema Skin: No evidence of breakdown, no evidence of rash Neurologic: Cranial nerves II through XII intact, motor strength is 3- Right and 4 /5 Left  deltoid, 4/5 bilateral bicep, tricep, grip, hip flexor, knee extensors, ankle dorsiflexor and plantar flexor Sensory exam normal sensation to light touch and proprioception in bilateral upper  extremities  Musculoskeletal: pain with PROM RIght shoulder abd    Assessment/Plan: 1. Functional deficits which require 3+ hours per day of interdisciplinary therapy in a comprehensive inpatient rehab setting.  Physiatrist is providing close team supervision and 24 hour management of active medical problems listed below.  Physiatrist and rehab team continue to assess barriers to discharge/monitor patient progress toward functional and medical goals  Care Tool:  Bathing    Body parts bathed by patient: Right arm,Left arm,Chest,Abdomen,Front perineal area,Buttocks,Right upper leg,Left upper leg,Face   Body parts bathed by helper: Left lower leg,Right lower leg     Bathing assist Assist Level: Moderate Assistance - Patient 50 - 74%     Upper Body Dressing/Undressing Upper body dressing   What is the patient wearing?: Hospital gown only    Upper body assist Assist Level: Minimal Assistance - Patient > 75%    Lower Body Dressing/Undressing Lower body dressing      What is the patient wearing?: Hospital gown only     Lower body assist  Toileting Toileting    Toileting assist Assist for toileting: Minimal Assistance - Patient > 75%     Transfers Chair/bed transfer  Transfers assist     Chair/bed transfer assist level: Minimal Assistance - Patient > 75%     Locomotion Ambulation   Ambulation assist      Assist level: Minimal Assistance - Patient > 75% Assistive device: Hand held assist Max distance: 10   Walk 10 feet activity   Assist     Assist level:  Minimal Assistance - Patient > 75% Assistive device: Hand held assist   Walk 50 feet activity   Assist    Assist level: 2 helpers Assistive device: Hand held assist    Walk 150 feet activity   Assist Walk 150 feet activity did not occur: Safety/medical concerns         Walk 10 feet on uneven surface  activity   Assist Walk 10 feet on uneven surfaces activity did not occur: Safety/medical concerns         Wheelchair     Assist Will patient use wheelchair at discharge?: No             Wheelchair 50 feet with 2 turns activity    Assist            Wheelchair 150 feet activity     Assist          Blood pressure 127/83, pulse 87, temperature 98.2 F (36.8 C), temperature source Oral, resp. rate 18, height 5\' 8"  (1.727 m), weight 81.9 kg, SpO2 93 %.    Medical Problem List and Plan: 1.Debilitysecondary to fall with 3 column T8 vertebral body fracture/T7 neural arch fracture/right TVP T4-8/right C7 transverse process fracture through the foramen. CTO to be applied in supine position -patient may shower with CTO -ELOS/Goals: ~2-2.5 weeks- supervision to min A             -Continue CIR therapies including PT, OT, and SLP   2. Antithrombotics: -DVT/anticoagulation:Lovenox.               -vascular study negative -antiplatelet therapy: Aspirin 81 mg daily 3. Pain Management:Oxycodone as needed             -?mild neuropathic pain RUE. ?C7 radic---continue gabapentin 100mg  tid 4. Mood:Wellbutrin 300 mg daily, BuSpar 10 mg 3 times daily, Klonopin 0.5 mg nightly, -antipsychotic agents: Seroquel 150 mg twice daily and 300 mg nightly             -  sleep chart             -behavior has been pretty appropriate 5. Neuropsych: This patientis notcapable of making decisions on hisown behalf. 6. Skin/Wound Care:Routine skin checks 7. Fluids/Electrolytes/Nutrition:               -recent lab  work is within normal limits.             -eating well, DC TF 8. Acute blood loss anemia. Follow-up CBC 9. Multiple bilateral rib fractures. Conservative care 10. 3: T8 vertebral body fracture. Follow-up neurosurgery Dr. . Nonoperative.              -CTO for HOB>30 degrees at this point-  ~7wks post fall  11. T7 neural arch fracture. Follow-up neurosurgery 12. Right TVP T4-8. Pain control follow neurosurgery 13. Right C7 TVP fracture through foramen. CTA negative. CTO 14. Large scalp laceration right eyebrow laceration. Repaired 08/17/2020. Sutures removed 15. Hypertension. Lopressor 25 mg twice daily  16. CAD/MI/DES. Continue aspirin 17. Acute hypercarbic ventilatory dependent respiratory failure/COPD. Tracheostomy 09/03/2020 per Dr. Janee Morn. Changed back to #6 cuffless 10/05/2020 due to secretions. Patient will follow up long-term with Dr. Sherene Sires           CBC CXR unremarkable Likely COPD exacerbation will cont Augmentin, O2 requirement down to 5L again 18. Dysphagia. Gastrostomy tube 09/03/2020 per Dr. Janee Morn.              -Dysphagia #2 thin liquids--advance as tolerated 19. BPH. Continue Hytrin/Flomax/Urecholine---really on near max doses              -still retaining: 500-800c --some improvement             -in/out caths q6 hours until he empties/voids.             -oob to void for all attempts 20. History of alcohol tobacco abuse. Provide counseling 21. Constipation---+bm 5/28 22.  Right shoulder weakness neg Xray, not explained by C spine CT, may be RTC, may need ortho f/u as OP for this LOS: 6 days A FACE TO FACE EVALUATION WAS PERFORMED  Erick Colace 10/15/2020, 12:34 PM

## 2020-10-15 NOTE — Progress Notes (Signed)
Patient slept well. No complaints of shortness of breath. Oxygen saturation remained in the 90's. Will continue to monitor.

## 2020-10-15 NOTE — Progress Notes (Signed)
Occupational Therapy Session Note  Patient Details  Name: Charles Weeks MRN: [id number] Date of Birth: [birth year]  Today's Date: 10/15/2020 OT Individual Time: 0900-1000 OT Individual Time Calculation (min): 60 min    Short Term Goals: Week 1:  OT Short Term Goal 1 (Week 1): Pt will complete toileting tasks with min A OT Short Term Goal 2 (Week 1): Pt will transfer to toilet with min A OT Short Term Goal 3 (Week 1): Pt will instruct caregiver on donning TLSO with min cueing OT Short Term Goal 4 (Week 1): Pt will use LRAD to wash LB with min A  Skilled Therapeutic Interventions/Progress Updates:    Patient in bed, alert and ready for therapy session, he notes mild pain in back.  ABS 14.  PMV in place t/o session.  O2 via trach collar 28%, HR in high 90s/low 100s, O2 sat 88-94% with occ rest breaks and cues for breathing techniques.  Mod A to doff OH shirt in supine, he is able to wash upper body with set up, max A to place abd binder, donns OH shirt mod A.  Rolling in bed with CS, max A to donn TLSO in supine.  Mod A to donn shorts in supine position.  Supine to sitting edge of bed with mod A.  Good unsupported sitting.  SPT bed to w/c CGA.  Completed oral care with CS.  Washed hair with shampoo tray, he is able to comb independently.  Completed shoulder slides and AAROM/light stretch for right shoulder, provided foam block for right hand exercise/grip strengthening.  SPT to recliner at close of session with CGA, PT to start momentarily.    Therapy Documentation Precautions:  Precautions Precautions: Cervical,Back Precaution Booklet Issued: Yes (comment) Precaution Comments: trach collar, TLSO don with rolling if HOB >30,  abdominal binder to protect PEG, c-collar d/c'ed 5/24 Required Braces or Orthoses: Spinal Brace Cervical Brace: Hard collar,At all times Spinal Brace: Thoracolumbosacral orthotic,Applied in supine position Restrictions Weight Bearing Restrictions: No  Therapy/Group:  Individual Therapy  Barrie Lyme 10/15/2020, 8:14 AM

## 2020-10-15 NOTE — Progress Notes (Signed)
Per patient he urinated in bathroom with therapist; refused bladder scan at this time.

## 2020-10-15 NOTE — Progress Notes (Addendum)
Sister called nurses station. RN returned called 2x but no answer.Spoke with daughter early this morning as well.

## 2020-10-15 NOTE — Progress Notes (Signed)
This nurse notified by nurse tech that patient family had brought in [employer] cheeseburger and fries to patient. This food choice is not on  A Dysphagia  2 diet order. This nurse removed cheeseburger and fries from patient. Family member accused nurse of forcefully removing a patients food. This nurse educated patient and family member about risks and complications that can arise from the aspiration of food into a compromised airway and the risk of plugging the trach and asphyxiation. Patient family states " I know I want to talk with the doctor as soon as possible to get this order changed. This nurse notified primary nurse of request and Provider was notified.

## 2020-10-15 NOTE — Progress Notes (Signed)
Speech Language Pathology TBI Note  Patient Details  Name: Charles Weeks MRN: [id number] Date of Birth: [birth year]  Today's Date: 10/15/2020 SLP Individual Time: 1345-1425 SLP Individual Time Calculation (min): 40 min  Short Term Goals: Week 1: SLP Short Term Goal 1 (Week 1): Patient will consume Dys 3 solids trials without significant changes in vitals (oxygen saturations, etc) and without overt s/s aspiration or penetration. SLP Short Term Goal 2 (Week 1): Patient will perform basic level medication and money management tasks with minA cues. SLP Short Term Goal 3 (Week 1): Patient will demonstrate awareness to deficits and impact on his function during simulated tasks with minA. SLP Short Term Goal 4 (Week 1): Patient will demonstrate basic level alternating attention during functional tasks with min-modA cues. SLP Short Term Goal 5 (Week 1): Patient will maintain adequate vocal intensity and speech intelligibility (at 85% intelligible) during semi-structured conversations, with min-modA cues.  Skilled Therapeutic Interventions: Skilled treatment session focused on speech and dysphagia goals. Upon arrival, patient's sister present and asking questions regarding current diet. SLP facilitated session by providing education regarding lack of appetite and overall PO intake with frequent declining of trials resulting in decreased ability to upgrade diet. With encouragement from sister, patient consumed trials of Dys. 3 textures with efficient mastication and minimal oral residue that cleared with a liquid wash. No overt s/s of aspiration noted. Therefore, recommend patient upgrade to Dys. 3 textures with hopes of maximizing overall PO intake. Also recommend patient can consume thin liquids with only intermittent supervision when sitting upright in the recliner. Patient's PMSV was in place throughout session with patient demonstrated improved breath support and overall vocal intensity at the phrase and  sentence level maximizing intelligibility to ~90%. Patient provided education regarding importance of diaphragmatic breathing vs utilization of accessory muscles. Patient left upright in recliner with all needs within reach and sister present. Continue with current plan of care.       Pain No/Denies Pain   Agitated Behavior Scale: TBI Observation Details Observation Environment: Patient's room Start of observation period - Date: 10/15/20 Start of observation period - Time: 1345 End of observation period - Date: 10/15/20 End of observation period - Time: 1425 Agitated Behavior Scale (DO NOT LEAVE BLANKS) Short attention span, easy distractibility, inability to concentrate: Absent Impulsive, impatient, low tolerance for pain or frustration: Absent Uncooperative, resistant to care, demanding: Absent Violent and/or threatening violence toward people or property: Absent Explosive and/or unpredictable anger: Absent Rocking, rubbing, moaning, or other self-stimulating behavior: Absent Pulling at tubes, restraints, etc.: Absent Wandering from treatment areas: Absent Restlessness, pacing, excessive movement: Absent Repetitive behaviors, motor, and/or verbal: Absent Rapid, loud, or excessive talking: Absent Sudden changes of mood: Absent Easily initiated or excessive crying and/or laughter: Absent Self-abusiveness, physical and/or verbal: Absent Agitated behavior scale total score: 14  Therapy/Group: Individual Therapy  Carroll Ranney 10/15/2020, 2:31 PM

## 2020-10-15 NOTE — Progress Notes (Signed)
RN returned call to sister. RN spoke with sister.

## 2020-10-15 NOTE — Progress Notes (Signed)
Physical Therapy Session Note  Patient Details  Name: Charles Weeks MRN: [id number] Date of Birth: 1965-06-13  Today's Date: 10/15/2020 PT Individual Time: 8366-2947 PT Individual Time Calculation (min): 70 min   Short Term Goals: Week 1:  PT Short Term Goal 1 (Week 1): Pt will perform sit to stand with CGA PT Short Term Goal 2 (Week 1): Pt will perform bed to chair transfer with CGA. PT Short Term Goal 3 (Week 1): Pt will ambulate 100' with CGA and LRAD.  Skilled Therapeutic Interventions/Progress Updates:     Pt received seated in recliner and agrees to therapy. Reports fatigue and some difficulty breathing but improved form previous session. Pt noted to be on 10L supplemental O2 via trach collar. O2 sats 93%. Stand step transfer from recliner>WC>mat table with CGA.Marland Kitchen Pt sats at 96% prior to additional mobility and HR 89 bpm. Pt performs x10 step ups onto 3 inch platform with modA progressing to CGA/minA. Following pt's sats at 91% with HR 99. Pt completes step activity x4 bouts total with extended seated rest breaks between each bout. Following, pt complete stepping activity with cones, tasked with stepping over cones going forward and then stepping backward over cones. Intended to challenge single leg stance strength and balance as well as contralateral hip flexor strength. Mirror placed fo visual feedback. Pt completes 4x10 with seated rest breaks. PT provides minA for stability and also facilitates lateral weight shifting with pt noted to have improved control when increased cueing provided. WC transport back to room. Pt ambulates to toilet with minA. Pt is continent of bowel and performs pericare independently. Ambulatory transfer back to recliner with minA. Left with RN present and all needs within reach.  Therapy Documentation Precautions:  Precautions Precautions: Cervical,Back Precaution Booklet Issued: Yes (comment) Precaution Comments: trach collar, TLSO don with rolling if HOB  >30,  abdominal binder to protect PEG, c-collar d/c'ed 5/24 Required Braces or Orthoses: Spinal Brace Cervical Brace: Hard collar,At all times Spinal Brace: Thoracolumbosacral orthotic,Applied in supine position Restrictions Weight Bearing Restrictions: No    Therapy/Group: Individual Therapy  Beau Fanny, PT, DPT 10/15/2020, 3:47 PM

## 2020-10-15 NOTE — Progress Notes (Signed)
RN went to room spoke to patient and sister that patient is a dysphagia 2 diet and cannot have cheeseburger and fries. RN  educated patient and sister. Patient claims he does not like the food. Explained that patient is a full supervision and cannot have anything to drink when no staff is available. Sister claims in ICU he was able to drink water at bedside. RN educated patient and sister  on this as well.

## 2020-10-16 LAB — GLUCOSE, CAPILLARY
Glucose-Capillary: 149 mg/dL — ABNORMAL HIGH (ref 70–99)
Glucose-Capillary: 145 mg/dL — ABNORMAL HIGH (ref 70–99)
Glucose-Capillary: 124 mg/dL — ABNORMAL HIGH (ref 70–99)
Glucose-Capillary: 134 mg/dL — ABNORMAL HIGH (ref 70–99)

## 2020-10-16 NOTE — Progress Notes (Signed)
PROGRESS NOTE   Subjective/Complaints:  No new issues today. Breathing a bit better.  Right shoulder pain  ROS: Patient denies fever, rash, sore throat, blurred vision, nausea, vomiting, diarrhea,   chest pain,  headache, or mood change.    Objective:   No results found. Recent Labs    10/14/20 1009 10/15/20 0715  WBC 11.9* 9.0  HGB 11.8* 11.6*  HCT 38.3* 37.5*  PLT 351 321   No results for input(s): NA, K, CL, CO2, GLUCOSE, BUN, CREATININE, CALCIUM in the last 72 hours.  Intake/Output Summary (Last 24 hours) at 10/16/2020 1158 Last data filed at 10/16/2020 0405 Gross per 24 hour  Intake 420 ml  Output 2275 ml  Net -1855 ml        Physical Exam: Vital Signs Blood pressure 112/78, pulse 78, temperature 98.4 F (36.9 C), resp. rate 18, height 5\' 8"  (1.727 m), weight 81.9 kg, SpO2 94 %.   Constitutional: No distress . Vital signs reviewed. HEENT: EOMI, oral membranes moist Neck: supple Cardiovascular: RRR without murmur. No JVD    Respiratory/Chest: CTA Bilaterally without wheezes or rales. Normal effort    GI/Abdomen: BS +, non-tender, non-distended Ext: no clubbing, cyanosis, or edema Psych: pleasant but a little flat. Neurologic: Cranial nerves II through XII intact, motor strength is 3- Right and 4 /5 Left  deltoid, 4/5 bilateral bicep, tricep, grip, hip flexor, knee extensors, ankle dorsiflexor and plantar flexor Sensory exam normal sensation to light touch and proprioception in bilateral upper  extremities  Musculoskeletal: pain with PROM RIght shoulder abd    Assessment/Plan: 1. Functional deficits which require 3+ hours per day of interdisciplinary therapy in a comprehensive inpatient rehab setting.  Physiatrist is providing close team supervision and 24 hour management of active medical problems listed below.  Physiatrist and rehab team continue to assess barriers to discharge/monitor patient  progress toward functional and medical goals  Care Tool:  Bathing    Body parts bathed by patient: Right arm,Left arm,Chest,Abdomen,Front perineal area,Buttocks,Right upper leg,Left upper leg,Face   Body parts bathed by helper: Left lower leg,Right lower leg     Bathing assist Assist Level: Moderate Assistance - Patient 50 - 74%     Upper Body Dressing/Undressing Upper body dressing   What is the patient wearing?: Hospital gown only    Upper body assist Assist Level: Minimal Assistance - Patient > 75%    Lower Body Dressing/Undressing Lower body dressing      What is the patient wearing?: Hospital gown only     Lower body assist       Toileting Toileting    Toileting assist Assist for toileting: Minimal Assistance - Patient > 75%     Transfers Chair/bed transfer  Transfers assist     Chair/bed transfer assist level: Minimal Assistance - Patient > 75%     Locomotion Ambulation   Ambulation assist      Assist level: Minimal Assistance - Patient > 75% Assistive device: Hand held assist Max distance: 10   Walk 10 feet activity   Assist     Assist level: Minimal Assistance - Patient > 75% Assistive device: Hand held assist   Walk 50 feet  activity   Assist    Assist level: 2 helpers Assistive device: Hand held assist    Walk 150 feet activity   Assist Walk 150 feet activity did not occur: Safety/medical concerns         Walk 10 feet on uneven surface  activity   Assist Walk 10 feet on uneven surfaces activity did not occur: Safety/medical concerns         Wheelchair     Assist Will patient use wheelchair at discharge?: No             Wheelchair 50 feet with 2 turns activity    Assist            Wheelchair 150 feet activity     Assist          Blood pressure 112/78, pulse 78, temperature 98.4 F (36.9 C), resp. rate 18, height 5\' 8"  (1.727 m), weight 81.9 kg, SpO2 94 %.    Medical Problem  List and Plan: 1.Debilitysecondary to fall with 3 column T8 vertebral body fracture/T7 neural arch fracture/right TVP T4-8/right C7 transverse process fracture through the foramen. CTO to be applied in supine position -patient may shower with CTO -ELOS/Goals: ~2-2.5 weeks- supervision to min A            team conf today 2. Antithrombotics: -DVT/anticoagulation:Lovenox.               -vascular study negative -antiplatelet therapy: Aspirin 81 mg daily 3. Pain Management:Oxycodone as needed             -?mild neuropathic pain RUE. ?C7 radic---continue gabapentin 100mg  tid   -could be shoulder, RTC also   -continue with support, ROM  4. Mood:Wellbutrin 300 mg daily, BuSpar 10 mg 3 times daily, Klonopin 0.5 mg nightly, -antipsychotic agents: Seroquel 150 mg twice daily and 300 mg nightly             -  sleep chart             -behavior has been appropriate 5. Neuropsych: This patientis notcapable of making decisions on hisown behalf. 6. Skin/Wound Care:Routine skin checks 7. Fluids/Electrolytes/Nutrition:               -recent lab work is within normal limits.             -good po intake 8. Acute blood loss anemia. Follow-up CBC 9. Multiple bilateral rib fractures. Conservative care 10. 3: T8 vertebral body fracture. Follow-up neurosurgery Dr. . Nonoperative.              -CTO for HOB>30 degrees at this point-  ~7wks post fall  11. T7 neural arch fracture. Follow-up neurosurgery 12. Right TVP T4-8. Pain control follow neurosurgery 13. Right C7 TVP fracture through foramen. CTA negative. CTO 14. Large scalp laceration right eyebrow laceration. Repaired 08/17/2020. Sutures removed 15. Hypertension. Lopressor 25 mg twice daily 16. CAD/MI/DES. Continue aspirin 17. Acute hypercarbic ventilatory dependent respiratory failure/COPD. Tracheostomy 09/03/2020 per Dr. 10/17/2020. Changed back to #6 cuffless  10/05/2020 due to secretions. Patient will follow up long-term with Dr. Janee Morn           CBC CXR unremarkable   -5/31 back to 5L oxygen. Wbc's down yesterday. occ low grade temp, resp cx pend   -continue augmentin for now   -continue trach mgt, breathing treatments etc 18. Dysphagia. Gastrostomy tube 09/03/2020 per Dr. 6/31.              -Dysphagia #2 thin  liquids--advance as tolerated 19. BPH. Continue Hytrin/Flomax/Urecholine---really on near max doses              -Large bladder volumes             -in/out caths q4-6 hours---still not voiding.             -oob to void for all attempts 20. History of alcohol tobacco abuse. Provide counseling 21. Constipation---+bm 5/28    LOS: 7 days A FACE TO FACE EVALUATION WAS PERFORMED  Ranelle Oyster 10/16/2020, 11:58 AM

## 2020-10-16 NOTE — Progress Notes (Signed)
Physical Therapy Session Note  Patient Details  Name: Charles Weeks MRN: [id number] Date of Birth: June 15, 1965  Today's Date: 10/16/2020 PT Individual Time: 0820-0915 PT Individual Time Calculation (min): 55 min   Short Term Goals: Week 1:  PT Short Term Goal 1 (Week 1): Pt will perform sit to stand with CGA PT Short Term Goal 2 (Week 1): Pt will perform bed to chair transfer with CGA. PT Short Term Goal 3 (Week 1): Pt will ambulate 100' with CGA and LRAD.  Skilled Therapeutic Interventions/Progress Updates:     Pt misses first 20 minutes of session due to having just woken up and requesting PT return. Upon PT return pt agreeable to therapy. No complaint of pain. Pt performs bilateral rolling to don TLSO with supervision. Supine to sit with cues for positioning. Pt performs stand step transfer to [facility] with CGA. WC transport to gym for time management. Pt performs gait activity, tasked with stepping over cones to challenge balance and strengthen hip flexors. Pt ambulates x50' with minA while stepping over 4 rows of cones. 3 bouts total with seated rest breaks. PT cues pt for improve lateral weigh shifting to improve balance. Pt then taken outside. Pt perform sit to stand from [facility] and standing balance with feet shoulder width apart for 2 minutes, pt then stands with tandem stance with CGA for 1 minute, and pt attempts rhomberg stance but Is unable to balance, requiring modA to prevent fall. Pt takes extended seated rest break between each bout of standing. WC transport back inside. Pt perform stand step transfer back to bed with CGA. Left seated with alarm intact and all needs within reach.  Therapy Documentation Precautions:  Precautions Precautions: Cervical,Back Precaution Booklet Issued: Yes (comment) Precaution Comments: trach collar, TLSO don with rolling if HOB >30,  abdominal binder to protect PEG, c-collar d/c'ed 5/24 Required Braces or Orthoses: Spinal Brace Cervical Brace: Hard  collar,At all times Spinal Brace: Thoracolumbosacral orthotic,Applied in supine position Restrictions Weight Bearing Restrictions: No   Therapy/Group: Individual Therapy  Beau Fanny, PT, DPT 10/16/2020, 4:27 PM

## 2020-10-16 NOTE — Progress Notes (Signed)
Occupational Therapy Session Note  Patient Details  Name: Charles Weeks MRN: [id number] Date of Birth: 20-Dec-1965  Today's Date: 10/16/2020 OT Individual Time: 0930-1030 OT Individual Time Calculation (min): 60 min    Short Term Goals: Week 1:  OT Short Term Goal 1 (Week 1): Pt will complete toileting tasks with min A OT Short Term Goal 2 (Week 1): Pt will transfer to toilet with min A OT Short Term Goal 3 (Week 1): Pt will instruct caregiver on donning TLSO with min cueing OT Short Term Goal 4 (Week 1): Pt will use LRAD to wash LB with min A  Skilled Therapeutic Interventions/Progress Updates:    Pt received sitting EOB. Pt had TLSO on and it needed some minor adjustments.  Pt stated he did not need to bathe and dress but did want to do oral care and shaving.   Pt able to stand up with CGA and worked on initial balance exercises of marching in place with CGA.  He then sat in wc at sink to complete oral care.  Attempted shaving with electric razor but too difficult for him to manage as his coordination was not steady.  Assisted pt with shaving.    Worked on standing balance at sink while engaging in R shoulder AROM exercises with pendulums, A/arom with gravity eliminated. Pt does have sh pain with trying to lift arm but not with gravity eliminated.   Pt then sat in recliner and arm positioned on tray table for sh flexion with towel slides. Pt did well with this exercise and continued to work on it at end of session.  Pt resting in recliner with chair alarm on and all needs met.    Therapy Documentation Precautions:  Precautions Precautions: Cervical,Back Precaution Booklet Issued: Yes (comment) Precaution Comments: trach collar, TLSO don with rolling if HOB >30,  abdominal binder to protect PEG, c-collar d/c'ed 5/24 Required Braces or Orthoses: Spinal Brace Cervical Brace: Hard collar,At all times Spinal Brace: Thoracolumbosacral orthotic,Applied in supine  position Restrictions Weight Bearing Restrictions: No    Vital Signs: Therapy Vitals Pulse Rate: 88 Resp: 16 Oxygen Therapy SpO2: 98 % O2 Device: Tracheostomy Collar O2 Flow Rate (L/min): 5 L/min FiO2 (%): 28 % Pain: Pain Assessment Pain Scale: 0-10 Pain Score: 7  Pain Type: Acute pain Pain Location: Back Pain Orientation: Mid Pain Descriptors / Indicators: Aching Pain Frequency: Constant Pain Onset: On-going Patients Stated Pain Goal: 4 Pain Intervention(s):  (premedicated) ADL: ADL Eating: Supervision/safety Where Assessed-Eating: Bed level Grooming: Supervision/safety Where Assessed-Grooming: Bed level Upper Body Bathing: Minimal assistance Where Assessed-Upper Body Bathing: Bed level Lower Body Bathing: Moderate assistance Where Assessed-Lower Body Bathing: Edge of bed Upper Body Dressing: Not assessed Lower Body Dressing: Not assessed Toileting: Moderate assistance Where Assessed-Toileting: Glass blower/designer: Moderate assistance Toilet Transfer Method: Ambulating Tub/Shower Transfer: Unable to assess Social research officer, government: Minimal assistance   Therapy/Group: Individual Therapy  [place] 10/16/2020, 10:28 AM

## 2020-10-16 NOTE — Progress Notes (Signed)
Patient ID: Charles Weeks, male   DOB: 1965/09/07, 55 y.o.   MRN: [id number]  SW met with pt in room to provide updates from team conference, and d/c date 6/15. Pt shared he was upset about not getting to eat breakfast prior to therapy. SW informed will sharing with nursing staff.   SW called pt sister Loletha Carrow [phone]) to provide updates from team conference, and d/c date. SW shared pt concerns on above. SW informed will discuss with medical team.   SW shared pt concerns with eating with medical team. *Therapy adjusted pt schedule to begin at 9am to help ensure pt is able to eat before therapy.  Loralee Pacas, MSW, [place] Office: [phone] Cell: [phone] Fax: [phone]

## 2020-10-16 NOTE — Progress Notes (Signed)
Nutrition Follow-up  DOCUMENTATION CODES:   Not applicable  INTERVENTION:  Continue Ensure Enlive po TID, each supplement provides 350 kcal and 20 grams of protein  Encourage adequate PO intake.   NUTRITION DIAGNOSIS:   Inadequate oral intake related to poor appetite as evidenced by meal completion < 25%; ongoing  GOAL:   Patient will meet greater than or equal to 90% of their needs; progressing  MONITOR:   PO intake,Supplement acceptance,Diet advancement,Skin,Weight trends,Labs,I & O's  REASON FOR ASSESSMENT:   Consult Enteral/tube feeding initiation and management  ASSESSMENT:   55 year old right-handed male with history of COPD hypertension, alcohol/tobacco use presented with level 1 fall after a fall down approximately 11 stairs. CT cervical spine showed mildly displaced acute fractures of the right C7 transverse process extending to involve the transverse foramen. Fractures through the neural arch of T7.  Multiple right transverse process fractures involving the T4-T8 vertebral bodies.  Multiple bilateral rib fractures at the costovertebral junction from T5-T8 on the right and T7 and T8 on the left. Body fracture non-operative. Hospital course prolonged intubation undergoing tracheostomy as well as gastrostomy tube placement 09/03/2020. Pt with patient decreased functional mobility was admitted for a comprehensive rehab program.  Pt continues on trach collar. Pt is currently on a dysphagia 3 diet with thin liquids. Meal completion has been 85-100%. Pt reports good appetite with no abdominal discomfort during time of visit. Pt currently has Ensure ordered and has been consuming them. RD to continue with current orders to aid in caloric and protein needs. Tube feeds via PEG discontinued 5/29 per MD orders as pt po intake has improved and is eating well.   Labs and medications reviewed.   Diet Order:   Diet Order            DIET DYS 3 Room service appropriate? Yes; Fluid  consistency: Thin  Diet effective now                 EDUCATION NEEDS:   Not appropriate for education at this time  Skin:  Skin Assessment: Reviewed RN Assessment  Last BM:  5/30  Height:   Ht Readings from Last 1 Encounters:  10/09/20 5\' 8"  (1.727 m)    Weight:   Wt Readings from Last 1 Encounters:  10/14/20 81.9 kg   BMI:  Body mass index is 27.45 kg/m.  Estimated Nutritional Needs:   Kcal:  2000-2200  Protein:  110-120 grams  Fluid:  >/= 2 L/day  10/16/20, MS, RD, LDN RD pager number/after hours weekend pager number on Amion.

## 2020-10-16 NOTE — Patient Care Conference (Signed)
Inpatient RehabilitationTeam Conference and Plan of Care Update Date: 10/16/2020   Time: 10:38 AM    Patient Name: Charles Weeks      Medical Record Number: [id number]  Date of Birth: 01-15-1966 Sex: Male         Room/Bed: 4W07C/4W07C-01 Payor Info: Payor: [place] MEDICAID PREPAID HEALTH PLAN / Plan: [place] [employer] [employer] / Product Type: *No Product type* /    Admit Date/Time:  10/09/2020  4:38 PM  Primary Diagnosis:  Thoracic spine fracture [facility])  Hospital Problems: Principal Problem:   Thoracic spine fracture [facility]) Active Problems:   Urinary retention due to benign prostatic hyperplasia   Tracheostomy in place [facility])    Expected Discharge Date: Expected Discharge Date: 10/31/20  Team Members Present: Physician leading conference: Dr. Faith Rogue Care Coodinator Present: Cecile Sheerer, LCSWA;Ashia Dehner Marlyne Beards, RN, BSN, CRRN Nurse Present: Kennyth Arnold, RN PT Present: Malachi Pro, PT OT Present: Kearney Hard, OT SLP Present: Feliberto Gottron, SLP PPS Coordinator present : Fae Pippin, SLP     Current Status/Progress Goal Weekly Team Focus  Bowel/Bladder   Continent of bowel. Last BM 5/30. Requires in and out cath q6-8. Patient was able to void with PT per rn report.  Remain continent of bowel. Maintain in and out cath 6-8 and assess bladder changes.  Monitor for changes in bowel and bladder function.   Swallow/Nutrition/ Hydration   Dys. 3 textures with thin liquids, Supervision  Mod I  tolerance of current diet upgrade   ADL's   Min A overall  Supervision  self-care retrainig, general strengthening, activity tolerance, pt.family education   Mobility   CGA trasnfers, ambulation CGA/minA  Supervision  Ambulation, endurance, balance   Communication   Min A-Supervision, #6 cuffless trach with PMSV during all waking hours  Mod I  increased breath support to maximize intelligibility with PMSV in place   Safety/Cognition/ Behavioral Observations  Min  A-Supervision  Mod I-Supervision  recall, basic problem solving and awareness   Pain   Complain of moderate pain to mid back . Oxyir 5mg  given prn.  Pain less than or equal to 2.  Monitor pain for pain and medicate prn especially prior to therapy.   Skin   Skin is intact. Peg tube to LUQ and tracheostomy in place.  No skin breakdown.  Assess skin q shift and prn.     Discharge Planning:  Pt and sister live together. Sister to provide 24/7 care, and willing to manage pt trach care needs as well.   Team Discussion: Fall with multiple fx's doing conservative care. Required more O2 over the weekend, currently has #6 cuffless and would like to downsize. Continent of bowel, I&O cath every 6-8 hours due to high volumes. 7-8/10 pain right pain has Oxy q 4 hr. Sister is HCPOA and can assist at discharge.  Patient on target to meet rehab goals: yes, min/mod assist ADL's, fatigues quickly, working on activity tolerance. Walked 150 ft, completely fatigued after. Working on getting enough breath support. 90% O2 is OK with the MD. Dys 3 diet, thin liquids.  *See Care Plan and progress notes for long and short-term goals.   Revisions to Treatment Plan:  Adjusting medications.  Teaching Needs: Family education, medication management, pain management, bowel/bladder management, trach management, skin/wound care, transfer training, gait training, balance training, endurance training, safety awareness, O2 management.  Current Barriers to Discharge: Decreased caregiver support, Medical stability, Home enviroment access/layout, Incontinence, Neurogenic bowel and bladder, Wound care, Lack of/limited family support, Weight, Weight bearing  restrictions, Medication compliance, Behavior and New oxygen  Possible Resolutions to Barriers: Continue current medications, provide emotional support.     Medical Summary Current Status: spinal trauma with cervical-thoracic fx's after fall down stairs.  in CTO, chronic  COPD. ?tracheobronchitis, increased oxygen needs. still retaining urine  Barriers to Discharge: Medical stability;Behavior   Possible Resolutions to [employer] Focus: antibiotics, trach education and care, pain control, bladder emptying meds/toileting   Continued Need for Acute Rehabilitation Level of Care: The patient requires daily medical management by a physician with specialized training in physical medicine and rehabilitation for the following reasons: Direction of a multidisciplinary physical rehabilitation program to maximize functional independence : Yes Medical management of patient stability for increased activity during participation in an intensive rehabilitation regime.: Yes Analysis of laboratory values and/or radiology reports with any subsequent need for medication adjustment and/or medical intervention. : Yes   I attest that I was present, lead the team conference, and concur with the assessment and plan of the team.   Tennis Must 10/16/2020, 4:25 PM

## 2020-10-16 NOTE — Progress Notes (Signed)
Occupational Therapy Weekly Progress Note  Patient Details  Name: Charles Weeks MRN: [id number] Date of Birth: 01/04/1966  Beginning of progress report period: Oct 10, 2020 End of progress report period: Oct 16, 2020  Today's Date: 10/16/2020 OT Individual Time: 1120-1200 OT Individual Time Calculation (min): 40 min  and Today's Date: 10/16/2020 OT Missed Time: 20 Minutes Missed Time Reason: Nursing care   Patient has met 3 of 4 short term goals.  Pt is making slow but steady progress towards OT goals. Pt continues to be very limited by endurance within BADL tasks and requires min A but multiple rest breaks to complete tasks. Continue current POC.  Patient continues to demonstrate the following deficits: muscle weakness, decreased cardiorespiratoy endurance and decreased oxygen support and decreased sitting balance, decreased standing balance, decreased balance strategies and difficulty maintaining precautions and therefore will continue to benefit from skilled OT intervention to enhance overall performance with BADL and Reduce care partner burden.  Patient progressing toward long term goals..  Continue plan of care.  OT Short Term Goals Week 1:  OT Short Term Goal 1 (Week 1): Pt will complete toileting tasks with min A OT Short Term Goal 1 - Progress (Week 1): Met OT Short Term Goal 2 (Week 1): Pt will transfer to toilet with min A OT Short Term Goal 2 - Progress (Week 1): Met OT Short Term Goal 3 (Week 1): Pt will instruct caregiver on donning TLSO with min cueing OT Short Term Goal 3 - Progress (Week 1): Progressing toward goal OT Short Term Goal 4 (Week 1): Pt will use LRAD to wash LB with min A OT Short Term Goal 4 - Progress (Week 1): Met Week 2:  OT Short Term Goal 1 (Week 2): Pt will instruct caregiver on donning TLSO with min cueing OT Short Term Goal 2 (Week 2): Patient will tolerate standing for 5 minutes in prep for BADL task OT Short Term Goal 3 (Week 2): Pt will use AE  for LB dressing with min questioning cues  Skilled Therapeutic Interventions/Progress Updates:    Pt greeted seated in recliner getting deep suctioned by respiratory. Pt missed 20 minutes of OT treatment session 2/2 nursing care. Pt declined need for any BADLs. Pt on 8L of O2 throughout session. Pt ambulated to wc 10 feet in room without AD and min HHA. Pt needed verbal cues to slow down especially when turning. Pt brought to therapy gym and continued working on standing balance/endurance with Wii bowling activity. Pt tolerated 2, 4 minute standing bouts with rest breaks in between. SpO2 maintained at 95%. Pt then ambulated 30 feet without AD and min A with 2 lateral LOB's requiring mod A to correct. SpO2 also dropped to 85% with increased activity and needed rest break and 2 minutes to recover above 90%. Pt returned to room and ambulated to recliner with min A. Pt left seated in recliner with chair alarm on, call bell in reach, and needs met.   Therapy Documentation Precautions:  Precautions Precautions: Cervical,Back Precaution Booklet Issued: Yes (comment) Precaution Comments: trach collar, TLSO don with rolling if HOB >30,  abdominal binder to protect PEG, c-collar d/c'ed 5/24 Required Braces or Orthoses: Spinal Brace Cervical Brace: Hard collar,At all times Spinal Brace: Thoracolumbosacral orthotic,Applied in supine position Restrictions Weight Bearing Restrictions: No General: General OT Amount of Missed Time: 20 Minutes Vital Signs: Therapy Vitals Pulse Rate: 78 Resp: 18 Patient Position (if appropriate): Sitting Oxygen Therapy SpO2: 94 % O2 Device: Tracheostomy Collar O2  Flow Rate (L/min): 5 L/min FiO2 (%): 28 % Pain: Pain Assessment Pain Score: 7  Pain Type: Acute pain Pain Location: Back Pain Descriptors / Indicators: Aching Pain Onset: On-going Pain Intervention(s):  (premedicated)   Therapy/Group: Individual Therapy  Valma Cava 10/16/2020, 12:52 PM

## 2020-10-17 DIAGNOSIS — F329 Major depressive disorder, single episode, unspecified: Secondary | ICD-10-CM

## 2020-10-17 DIAGNOSIS — F418 Other specified anxiety disorders: Secondary | ICD-10-CM

## 2020-10-17 LAB — GLUCOSE, CAPILLARY
Glucose-Capillary: 130 mg/dL — ABNORMAL HIGH (ref 70–99)
Glucose-Capillary: 127 mg/dL — ABNORMAL HIGH (ref 70–99)

## 2020-10-17 MED ORDER — PREDNISONE 5 MG PO TABS
10.0000 mg | ORAL_TABLET | Freq: Two times a day (BID) | ORAL | Status: DC
  Administered 2020-10-17 – 2020-10-22 (×11): 10 mg via ORAL
  Filled 2020-10-17 (×11): qty 2

## 2020-10-17 NOTE — Plan of Care (Signed)
  Problem: RH Problem Solving Goal: LTG Patient will demonstrate problem solving for (SLP) Description: LTG:  Patient will demonstrate problem solving for basic/complex daily situations with cues  (SLP) Flowsheets (Taken 10/17/2020 [phone]) LTG: Patient will demonstrate problem solving for (SLP): Basic daily situations Note: Downgraded due to previous level of functioning   Problem: RH Awareness Goal: LTG: Patient will demonstrate awareness during functional activites type of (SLP) Description: LTG: Patient will demonstrate awareness during functional activites type of (SLP) Flowsheets (Taken 10/17/2020 1191) LTG: Patient will demonstrate awareness during cognitive/linguistic activities with assistance of (SLP): Supervision Note: Downgraded due to previous level of functioning

## 2020-10-17 NOTE — Progress Notes (Addendum)
Occupational Therapy Session Note  Patient Details  Name: Charles Weeks MRN: [id number] Date of Birth: Apr 08, 1966  Today's Date: 10/17/2020 OT Individual Time: 4098-1191 OT Individual Time Calculation (min): 51 min   Session 2: OT Individual Time: 1410-1430 OT Individual Time Calculation (min): 20 min    Short Term Goals: Week 2:  OT Short Term Goal 1 (Week 2): Pt will instruct caregiver on donning TLSO with min cueing OT Short Term Goal 2 (Week 2): Patient will tolerate standing for 5 minutes in prep for BADL task OT Short Term Goal 3 (Week 2): Pt will use AE for LB dressing with min questioning cues  Skilled Therapeutic Interventions/Progress Updates:    Session 1: Pt received sitting in the recliner with c/o pain "all over", described as soreness, unrated and no request for intervention. He stood at the sink to wash his face, close supervision-CGA for transfer. His SpO2 fluctuated on RA with PMSV on, around 88-90%. After 2 minutes it did drop to 84% and pt required cueing for seated rest break. Pt's sister arrived and contradicted his previous claims of wearing clean clothes and requested he bathe, pt reluctantly agreeable. He returned to supine in bed and doffed TLSO with bed flat, by rolling R and L with supervision. His sister was edu on donning/doffing brace. He completed UB bathing with min A for thoroughness. TLSO re-donned with max A. Pt required frequent rest breaks with desaturations to low 80's and often with associated cyanosis (?). Pt completed transfer back to EOB and then stood for peri hygiene. He required cueing to not remove pants while standing and to remain seated for fall risk reduction. Pt again required frequent rest breaks. Pt transferred back to the recliner and was left sitting up with the chair pad alarm set, 5L 28% FiO2 via trach on. Sister present.   Session 2: Pt received sitting in recliner with similar c/o to previous session, agreeable to OT session. Pt on  RA throughout session. He completed ambulatory transfer to the w/c with CGA. He was taken to the therapy gym via w/c for time management. He completed intervals on the BUE ergometer for overall UE strengthening and cardiorespiratory endurance. He was able to last 1 min on average each trial before requiring rest break and each time desaturation to 85%. 1 min to rebound to 90%, 2 min to reach 93%. Pt also reporting intermittent RUE shoulder pain. Pt was returned to his room and passed off to SLP in room.   Therapy Documentation Precautions:  Precautions Precautions: Cervical,Back Precaution Booklet Issued: Yes (comment) Precaution Comments: trach collar, TLSO don with rolling if HOB >30,  abdominal binder to protect PEG, c-collar d/c'ed 5/24 Required Braces or Orthoses: Spinal Brace Cervical Brace: Hard collar,At all times Spinal Brace: Thoracolumbosacral orthotic,Applied in supine position Restrictions Weight Bearing Restrictions: No    Vital Signs: Therapy Vitals Temp: 98.5 F (36.9 C) Pulse Rate: 79 Resp: 18 BP: 111/80 Patient Position (if appropriate): Lying Oxygen Therapy SpO2: 96 % O2 Device: Tracheostomy Collar O2 Flow Rate (L/min): 5 L/min FiO2 (%): 28 %   Therapy/Group: Individual Therapy  Crissie Reese 10/17/2020, 6:39 AM

## 2020-10-17 NOTE — Consult Note (Signed)
Neuropsychological Consultation   Patient:   Charles Weeks   DOB:   04-29-1966  MR Number:  [id number]  Location:  [facility] [address] CENTER A [address] [id number] [place] [address] Dept: [phone] Loc: [phone]           Date of Service:   10/17/2020  Start Time:   1 PM End Time:   3 PM  Provider/Observer:  Arley Phenix, Psy.D.       Clinical Neuropsychologist       Billing Code/Service: [phone]  Chief Complaint:    Charles Weeks is a 55 year old male with history of COPD followed by Dr. Sherene Sires, CAD, MI with DES placement in RCA January 2020 maintained on aspirin, hypertension, depression and anxiety, BPH, alcohol use as well as tobacco use.  Patient presented on 08/17/2020 after fall down approximately 11 stairs and was found by his sister.  There was a denial of loss of consciousness.  Patient was noted to be hypoxic in the 70s and placed on nonrebreather mask.  Cranial CT scan was negative for acute processes.  CT cervical spine showed mildly displaced acute fractures of the right C7 transverse process extending to involve the transverse foramen.  There was also a large right pneumothorax with small amount of hemorrhage within the pleural space.  3: Fracture of T8 vertebral body.  Fractures throughout the neural arch of T7.  Multiple right transverse process fractures involving the T4-T8 vertebral body.  Multiple bilateral rib fractures but no evidence of solid organ injury.  During hospital course, patient had prolonged intubation undergoing tracheostomy as well as gastrostomy tube placement on 09/03/2020.  Palliative care was consulted to establish goals of care.  Initial bouts of agitation or restlessness and was maintained on Seroquel initially.  Patient brought to CIR due to functional mobility declines.  Reason for Service:  Patient was referred for neuropsychological consultation due to coping and adjustment  issues in the setting of prior medical history of depression and anxiety.  Below is the HPI for the current admission.  Charles Weeks is a 55 year old right-handed male with history of COPD followed by Dr. Sherene Sires, CAD/MI with DES placement in RCA January 2020 maintained on aspirin, hypertension, depression/anxiety, BPH, alcohol use as well as tobacco.Per chart review patient lives with his sister. Independent prior to admission. Two-level home bedroom downstairs.Presented 08/17/2020 after a fall down approximately 11 stairs and was found by his sister. Denied loss of consciousness. Noted to be hypoxic in the 70s placed on nonrebreather mask. Cranial CT scan negative for acute changes. CT cervical spine showed mildly displaced acute fractures of the right C7 transverse process extending to involve the transverse foramen. CT of the chest abdomen pelvis showed a large right pneumothorax with small amount of hemorrhage within the pleural space. 3 column fracture of the T8 vertebral body. No retropulsion. Fractures through the neural arch of T7. Multiple right transverse process fractures involving the T4-T8 vertebral bodies. Multiple bilateral rib fractures at the costovertebral junction from T5-T8 on the right and T7 and T8 on the left. No evidence of solid organ injury. CT angiogram of the neck showed no acute vascular injury to the neck. Patient did sustain complex laceration of the scalp with repair. Follow-up neurosurgery Dr. Yetta Barre in regards to 3 column T8 vertebral body fracture nonoperative placed in brace. Right C7 transverse process fracture through foramen initially with cervical collar that was later discontinued. Hospital course prolonged intubation undergoing tracheostomy as  well as gastrostomy tube placement 09/03/2020 per Dr. Janee Morn. Trach changed to a #6 cuffless 10/05/2020 due to secretions. He was cleared Lovenox for DVT prophylaxis as well as being cleared to continue  home aspirin. Acute blood loss anemia 10.1 and monitored. Leukocytosis 19,200 improved to 13,500. Bouts of urinary retention placed on Urecholine as well as Hytrin/Flomax. Current dysphagia #2 thin liquid diet. Palliative care consulted to establish goals of care. Initial bouts of agitation or restlessness maintained on Seroquel. Therapy evaluations completed due to patient decreased functional mobility was admitted for a comprehensive rehab program.  Current Status:  Patient was awake and alert and oriented.  Patient was aware of length of stay expectations.  Patient acknowledges prior history of depression and anxiety but reports that there has been no exacerbation of his depression or anxiety since his fall and hospitalization.  Patient reports that he is doing well on his home medications including Wellbutrin and BuSpar but has had times of agitation and does have Klonopin available at night to aid with sleep.  Patient also has been given Seroquel to aid with sleep as well and address some of the agitation.  Behavioral Observation: Charles Weeks  presents as a 55 y.o.-year-old Right Caucasian Male who appeared his stated age. his dress was Appropriate and he was Well Groomed and his manners were Appropriate to the situation.  his participation was indicative of Appropriate behaviors.  There were physical disabilities noted.  he displayed an appropriate level of cooperation and motivation.     Interactions:    Active Appropriate and Redirectable  Attention:   abnormal and attention span appeared shorter than expected for age  Memory:   abnormal; remote memory intact, recent memory impaired  Visuo-spatial:  not examined  Speech (Volume):  low  Speech:   normal; normal  Thought Process:  Coherent and Relevant  Though Content:  WNL; not suicidal and not homicidal  Orientation:   person, place and situation  Judgment:   Fair  Planning:   Poor  Affect:    Flat and  Lethargic  Mood:    Dysphoric  Insight:   Fair  Intelligence:   normal  Medical History:   Past Medical History:  Diagnosis Date  . Anxiety   . Anxiety 01/19/2020  . Arthritis   . Benign prostatic hyperplasia with weak urinary stream 12/08/2019  . BPH (benign prostatic hyperplasia) 08/17/2020  . CAD (coronary artery disease) 08/17/2020  . Chronic respiratory failure with hypoxia (HCC) 01/24/2020   Has home 02 but not using as of 01/24/2020  -  01/24/2020   Walked RA  approx   200 ft  @ slow pace  stopped due to  Dizzy with sats still 95%     . Cigarette smoker 01/24/2020   Stopped regular smoking 10/2019 but still "now and then" as of 01/24/2020   . COPD (chronic obstructive pulmonary disease) (HCC)   . COPD (chronic obstructive pulmonary disease) (HCC) 08/17/2020   gold  . COPD GOLD ?     Quit smoking 10/2019  - Labs ordered 01/24/2020  :  allergy profile   alpha one AT phenotype   - 01/24/2020  After extensive coaching inhaler device,  effectiveness =    75% (short Ti) > change symb 80 to 160 2bid     . Coronary artery disease 06/08/2018   DES to RCA, [facility] [place])  . Depression   . Depression, major, single episode, moderate (HCC) 01/19/2020  . Encounter for screening  for malignant neoplasm of colon 12/08/2019  . Essential hypertension   . Essential hypertension 12/08/2019  . History of MI (myocardial infarction) 01/19/2020  . HTN (hypertension) 08/17/2020  . Myocardial infarct (HCC)   . Myocardial infarct (HCC)   . Respiratory failure with hypoxia [facility])          Patient Active Problem List   Diagnosis Date Noted  . Depression with anxiety   . Tracheostomy in place [facility]) 10/09/2020  . Pressure injury of skin 09/01/2020  . Thoracic spine fracture (HCC) 08/17/2020  . Urinary retention due to benign prostatic hyperplasia 12/08/2019  . COPD with acute exacerbation (HCC) 11/04/2019  . COPD GOLD IV        Psychiatric History:  Patient with history of diagnosis of  anxiety and depression with likely association with his increasing fragile medical status.  Family Med/Psych History:  Family History  Problem Relation Age of Onset  . Heart disease Mother   . Heart disease Father   . Diabetes Sister    Impression/DX:  Charles Weeks is a 55 year old male with history of COPD followed by Dr. Sherene Sires, CAD, MI with DES placement in RCA January 2020 maintained on aspirin, hypertension, depression and anxiety, BPH, alcohol use as well as tobacco use.  Patient presented on 08/17/2020 after fall down approximately 11 stairs and was found by his sister.  There was a denial of loss of consciousness.  Patient was noted to be hypoxic in the 70s and placed on nonrebreather mask.  Cranial CT scan was negative for acute processes.  CT cervical spine showed mildly displaced acute fractures of the right C7 transverse process extending to involve the transverse foramen.  There was also a large right pneumothorax with small amount of hemorrhage within the pleural space.  3: Fracture of T8 vertebral body.  Fractures throughout the neural arch of T7.  Multiple right transverse process fractures involving the T4-T8 vertebral body.  Multiple bilateral rib fractures but no evidence of solid organ injury.  During hospital course, patient had prolonged intubation undergoing tracheostomy as well as gastrostomy tube placement on 09/03/2020.  Palliative care was consulted to establish goals of care.  Initial bouts of agitation or restlessness and was maintained on Seroquel initially.  Patient brought to CIR due to functional mobility declines.  Patient was awake and alert and oriented.  Patient was aware of length of stay expectations.  Patient acknowledges prior history of depression and anxiety but reports that there has been no exacerbation of his depression or anxiety since his fall and hospitalization.  Patient reports that he is doing well on his home medications including Wellbutrin and BuSpar but  has had times of agitation and does have Klonopin available at night to aid with sleep.  Patient also has been given Seroquel to aid with sleep as well and address some of the agitation.   Diagnosis:    Cough - Plan: DG Chest 2 View, DG Chest 2 View         Electronically Signed   _______________________ Arley Phenix, Psy.D. Clinical Neuropsychologist

## 2020-10-17 NOTE — Progress Notes (Signed)
Speech Language Pathology Weekly Progress Note  Patient Details  Name: Charles Weeks MRN: [id number] Date of Birth: 1966-03-26  Beginning of progress report period: Oct 09, 2020 End of progress report period: October 17, 2020    Short Term Goals: Week 1: SLP Short Term Goal 1 (Week 1): Patient will consume Dys 3 solids trials without significant changes in vitals (oxygen saturations, etc) and without overt s/s aspiration or penetration. SLP Short Term Goal 1 - Progress (Week 1): Met SLP Short Term Goal 2 (Week 1): Patient will perform basic level medication and money management tasks with minA cues. SLP Short Term Goal 2 - Progress (Week 1): Not met SLP Short Term Goal 3 (Week 1): Patient will demonstrate awareness to deficits and impact on his function during simulated tasks with minA. SLP Short Term Goal 3 - Progress (Week 1): Met SLP Short Term Goal 4 (Week 1): Patient will demonstrate basic level alternating attention during functional tasks with min-modA cues. SLP Short Term Goal 4 - Progress (Week 1): Met SLP Short Term Goal 5 (Week 1): Patient will maintain adequate vocal intensity and speech intelligibility (at 85% intelligible) during semi-structured conversations, with min-modA cues. SLP Short Term Goal 5 - Progress (Week 1): Met    New Short Term Goals: Week 2: SLP Short Term Goal 1 (Week 2): Patient will consume current diet with minimal overt s/s of aspiration and overall Mod I for use of swallowing compensatory strategies. SLP Short Term Goal 2 (Week 2): Patient will demonstrate efficient mastiction and complete oral clearance with trials of regular textures over 2 sessions prior to upgrade with supervision level verbal cues. SLP Short Term Goal 3 (Week 2): Patient will utilize speech intelliigibility strategies at the sentence level to achieve ~90% intelligibility with supervision verbal cues. SLP Short Term Goal 4 (Week 2): Patient will recall functional information with  supervision level verbal cues for use of strategies.  Weekly Progress Updates: Patient has made functional gains and has met 4 of 5 STGs this reporting period. Currently, patient is consuming Dys. 3 textures with thin liquids with supervision-Min A verbal cues for use of swallowing compensatory strategies with minimal overt s/s of aspiration. Patient has a #6 cuffless trach and is wearing his PMSV during all waking hours. Patient continues to demonstrate decreased breath support and endurance requiring Min verbal cues for use of speech intelligibility strategies to achieve ~85% intelligibility at the phrase and sentence level. Patient also requires overall supervision-Min A verbal cues for recall of functional information and awareness of deficits and their impact on his overall safety. Patient and family education ongoing. Patient would benefit from continued skilled SLP intervention to maximize his speech, swallowing and cognitive functioning and overall functional independence prior to discharge.      Intensity: Minumum of 1-2 x/day, 30 to 90 minutes Frequency: 3 to 5 out of 7 days Duration/Length of Stay: 6/15 Treatment/Interventions: Cognitive remediation/compensation;Dysphagia/aspiration precaution training;Medication managment;Speech/Language facilitation;Patient/family education;Functional tasks     [place], [place] 10/17/2020, 6:28 AM

## 2020-10-17 NOTE — Progress Notes (Addendum)
Speech Language Pathology TBI Session Note  Patient Details  Name: Charles Weeks MRN: [id number] Date of Birth: November 04, 1965  Today's Date: 10/17/2020 SLP Individual Time: 1430-1510 SLP Individual Time Calculation (min): 40 min  Short Term Goals: Week 2: SLP Short Term Goal 1 (Week 2): Patient will consume current diet with minimal overt s/s of aspiration and overall Mod I for use of swallowing compensatory strategies. SLP Short Term Goal 2 (Week 2): Patient will demonstrate efficient mastiction and complete oral clearance with trials of regular textures over 2 sessions prior to upgrade with supervision level verbal cues. SLP Short Term Goal 3 (Week 2): Patient will utilize speech intelliigibility strategies at the sentence level to achieve ~90% intelligibility with supervision verbal cues. SLP Short Term Goal 4 (Week 2): Patient will recall functional information with supervision level verbal cues for use of strategies.  Skilled Therapeutic Interventions: Skilled treatment session focused on speech and dysphagia goals. Upon arrival, patient's PMSV was in place. Patient demonstrated decreased breath support while in an upright position and could only voice ~50% at the phrase and sentence level despite Max A mutlimodal cues. Patient consumed thin liquids via straw due to complains of xerostomia without overt s/s of aspiration. Patient was transferred to bed to put patient in a semi-reclined position and maximize use of diaphragmatic breathing for voicing. Patient was able to initially vocalize consistently but then demonstrated decreased breath support. Patient left sitting EOB with RN present. Continue with current plan of care.      Pain Pain Assessment Pain Scale: 0-10 Pain Score: 3  Pain Type: Acute pain Pain Location: Back Pain Orientation: Mid Pain Descriptors / Indicators: Aching Pain Frequency: Constant Patients Stated Pain Goal: 3 Pain Intervention(s): Medication (See  eMAR);Repositioned    Agitated Behavior Scale: TBI Observation Details Observation Environment: Patient's room Start of observation period - Date: 10/17/20 Start of observation period - Time: 1430 End of observation period - Date: 10/17/20 End of observation period - Time: 1500 Agitated Behavior Scale (DO NOT LEAVE BLANKS) Short attention span, easy distractibility, inability to concentrate: Absent Impulsive, impatient, low tolerance for pain or frustration: Absent Uncooperative, resistant to care, demanding: Absent Violent and/or threatening violence toward people or property: Absent Explosive and/or unpredictable anger: Absent Rocking, rubbing, moaning, or other self-stimulating behavior: Absent Pulling at tubes, restraints, etc.: Absent Wandering from treatment areas: Absent Restlessness, pacing, excessive movement: Absent Repetitive behaviors, motor, and/or verbal: Absent Rapid, loud, or excessive talking: Absent Sudden changes of mood: Absent Easily initiated or excessive crying and/or laughter: Absent Self-abusiveness, physical and/or verbal: Absent Agitated behavior scale total score: 14   Therapy/Group: Individual Therapy  Charles Weeks 10/17/2020, 3:40 PM

## 2020-10-17 NOTE — Progress Notes (Signed)
PROGRESS NOTE   Subjective/Complaints:  More short of breath this morning. Wheezing at times. Secretions no worse  ROS: Patient denies fever, rash, sore throat, blurred vision, nausea, vomiting, diarrhea, cough,   joint or back pain, headache, or mood change.    Objective:   No results found. Recent Labs    10/14/20 1009 10/15/20 0715  WBC 11.9* 9.0  HGB 11.8* 11.6*  HCT 38.3* 37.5*  PLT 351 321   No results for input(s): NA, K, CL, CO2, GLUCOSE, BUN, CREATININE, CALCIUM in the last 72 hours.  Intake/Output Summary (Last 24 hours) at 10/17/2020 0817 Last data filed at 10/17/2020 0813 Gross per 24 hour  Intake 1120 ml  Output 2075 ml  Net -955 ml        Physical Exam: Vital Signs Blood pressure 111/80, pulse 88, temperature 98.5 F (36.9 C), resp. rate 18, height 5\' 8"  (1.727 m), weight 81.9 kg, SpO2 93 %.   Constitutional: No distress . Vital signs reviewed. HEENT: EOMI, oral membranes moist Neck: supple Cardiovascular: RRR without murmur. No JVD    Respiratory/Chest: insp/exp wheezes, decreased air movement, working a little harder to breath GI/Abdomen: BS +, non-tender, non-distended Ext: no clubbing, cyanosis, or edema Psych: pleasant and cooperative Neurologic: Cranial nerves II through XII intact, motor strength is 3- Right and 4 /5 Left  deltoid, 4/5 bilateral bicep, tricep, grip, hip flexor, knee extensors, ankle dorsiflexor and plantar flexor Sensory exam normal sensation to light touch and proprioception in bilateral upper  extremities  Musculoskeletal: pain with PROM RIght shoulder abd/flex    Assessment/Plan: 1. Functional deficits which require 3+ hours per day of interdisciplinary therapy in a comprehensive inpatient rehab setting.  Physiatrist is providing close team supervision and 24 hour management of active medical problems listed below.  Physiatrist and rehab team continue to assess  barriers to discharge/monitor patient progress toward functional and medical goals  Care Tool:  Bathing    Body parts bathed by patient: Right arm,Left arm,Chest,Abdomen,Front perineal area,Buttocks,Right upper leg,Left upper leg,Face   Body parts bathed by helper: Left lower leg,Right lower leg     Bathing assist Assist Level: Moderate Assistance - Patient 50 - 74%     Upper Body Dressing/Undressing Upper body dressing   What is the patient wearing?: Hospital gown only    Upper body assist Assist Level: Minimal Assistance - Patient > 75%    Lower Body Dressing/Undressing Lower body dressing      What is the patient wearing?: Hospital gown only     Lower body assist       Toileting Toileting    Toileting assist Assist for toileting: Minimal Assistance - Patient > 75%     Transfers Chair/bed transfer  Transfers assist     Chair/bed transfer assist level: Minimal Assistance - Patient > 75%     Locomotion Ambulation   Ambulation assist      Assist level: Minimal Assistance - Patient > 75% Assistive device: Hand held assist Max distance: 10   Walk 10 feet activity   Assist     Assist level: Minimal Assistance - Patient > 75% Assistive device: Hand held assist   Walk 50 feet  activity   Assist    Assist level: 2 helpers Assistive device: Hand held assist    Walk 150 feet activity   Assist Walk 150 feet activity did not occur: Safety/medical concerns         Walk 10 feet on uneven surface  activity   Assist Walk 10 feet on uneven surfaces activity did not occur: Safety/medical concerns         Wheelchair     Assist Will patient use wheelchair at discharge?: No             Wheelchair 50 feet with 2 turns activity    Assist            Wheelchair 150 feet activity     Assist          Blood pressure 111/80, pulse 88, temperature 98.5 F (36.9 C), resp. rate 18, height 5\' 8"  (1.727 m), weight 81.9  kg, SpO2 93 %.    Medical Problem List and Plan: 1.Debilitysecondary to fall with 3 column T8 vertebral body fracture/T7 neural arch fracture/right TVP T4-8/right C7 transverse process fracture through the foramen. CTO to be applied in supine position -patient may shower with CTO -ELOS/Goals:10/31/20- supervision to min A              2. Antithrombotics: -DVT/anticoagulation:Lovenox.               -vascular study negative -antiplatelet therapy: Aspirin 81 mg daily 3. Pain Management:Oxycodone as needed             -?mild neuropathic pain RUE. ?C7 radic---continue gabapentin 100mg  tid   -could be shoulder, RTC also   -continue with support, ROM. 4. Mood:Wellbutrin 300 mg daily, BuSpar 10 mg 3 times daily, Klonopin 0.5 mg nightly, -antipsychotic agents: Seroquel 150 mg twice daily and 300 mg nightly             -  sleep chart             -behavior has been appropriate 5. Neuropsych: This patientis notcapable of making decisions on hisown behalf. 6. Skin/Wound Care:Routine skin checks 7. Fluids/Electrolytes/Nutrition:               -recent lab work is within normal limits.             -good po intake 8. Acute blood loss anemia. Follow-up CBC 9. Multiple bilateral rib fractures. Conservative care 10. 3: T8 vertebral body fracture. Follow-up neurosurgery Dr. 11/02/20. Nonoperative.              -CTO for HOB>30 degrees at this point-  ~7wks post fall  11. T7 neural arch fracture. Follow-up neurosurgery 12. Right TVP T4-8. Pain control follow neurosurgery 13. Right C7 TVP fracture through foramen. CTA negative. CTO 14. Large scalp laceration right eyebrow laceration. Repaired 08/17/2020. Sutures removed 15. Hypertension. Lopressor 25 mg twice daily 16. CAD/MI/DES. Continue aspirin 17. Acute hypercarbic ventilatory dependent respiratory failure/COPD. Tracheostomy 09/03/2020 per Dr. 10/17/2020. Changed back to #6  cuffless 10/05/2020 due to secretions. Patient will follow up long-term with Dr. Janee Morn           CBC CXR unremarkable   -5/31 back to 5L oxygen. Wbc's down yesterday. occ low grade temp, resp cx pend   -continue augmentin for now   -continue trach mgt, breathing treatments etc  6/1 working a little more to breath today, sats a little low   -add prednisone for a few days with wean   -continue nebs, inhalers   -  will have PCCM f/u with him this week 18. Dysphagia. Gastrostomy tube 09/03/2020 per Dr. Janee Morn.              -Dysphagia #3 thin liquids--advance per SLP 19. BPH. Continue Hytrin/Flomax/Urecholine---really on near max doses              -Large bladder volumes             -in/out caths q4-6 hours---still not voiding.             -oob to void for all attempts 20. History of alcohol tobacco abuse. Provide counseling 21. Constipation---+bm 5/30    LOS: 8 days A FACE TO FACE EVALUATION WAS PERFORMED  Ranelle Oyster 10/17/2020, 8:17 AM

## 2020-10-17 NOTE — Progress Notes (Signed)
Physical Therapy Session Note  Patient Details  Name: Charles Weeks MRN: [id number] Date of Birth: 11/02/1965  Today's Date: 10/17/2020 PT Individual Time: 0900-1000 PT Individual Time Calculation (min): 60 min   Short Term Goals: Week 1:  PT Short Term Goal 1 (Week 1): Pt will perform sit to stand with CGA PT Short Term Goal 2 (Week 1): Pt will perform bed to chair transfer with CGA. PT Short Term Goal 3 (Week 1): Pt will ambulate 100' with CGA and LRAD. Week 2:    Week 3:     Skilled Therapeutic Interventions/Progress Updates:    Pain:  Pt reports R shoulder chronic type pain.  Treatment to tolerance.  Rest breaks and repositioning as needed.  Pt initially supine and agreeable to treatment session w/focus on gait/endurance.  Pt initially supine.  Rolls L and R w/rails and supervision for therapist to don TLSO Supine to side to sit w/supervision,, additional time.  02 sats 90%, 5L 02 via trach collar. stand pivot transfer to wc w/min assist for balance, cues for safety. Gait trials for endurance: Pt on 6L 02 via trach collar  71ft w/RW cga to min assist w/balance when turning. 02 sats 90% 5-6 min seated rest recovery  79ft w/RW as above 02 sats 89% 5-6 min seated recovery  34ft w/RW as above 02sats 90% 5-6 min recovery  wc propulsion x 8ft w/bilat UEs for endurance challenge w/supervision, additional time.  stand pivot transfer wc to recliner w/RW and cga.  02 sats remain at 90-93. HR ranges 87-118. Pt left oob in recliner w/chair alarm set and needs in reach.   Therapy Documentation Precautions:  Precautions Precautions: Cervical,Back Precaution Booklet Issued: Yes (comment) Precaution Comments: trach collar, TLSO don with rolling if HOB >30,  abdominal binder to protect PEG, c-collar d/c'ed 5/24 Required Braces or Orthoses: Spinal Brace Cervical Brace: Hard collar,At all times Spinal Brace: Thoracolumbosacral orthotic,Applied in supine  position Restrictions Weight Bearing Restrictions: No    Therapy/Group: Individual Therapy  Rada Hay, PT   [name] 10/17/2020, 12:53 PM

## 2020-10-18 LAB — GLUCOSE, CAPILLARY: Glucose-Capillary: 134 mg/dL — ABNORMAL HIGH (ref 70–99)

## 2020-10-18 MED ORDER — OXYCODONE HCL 5 MG PO TABS
10.0000 mg | ORAL_TABLET | ORAL | Status: DC | PRN
  Administered 2020-10-19 – 2020-10-31 (×34): 10 mg via ORAL
  Filled 2020-10-18 (×34): qty 2

## 2020-10-18 MED ORDER — ACETAMINOPHEN 325 MG PO TABS: 650.0000 mg | ORAL_TABLET | Freq: Four times a day (QID) | ORAL | Status: DC | PRN

## 2020-10-18 MED ORDER — CLONAZEPAM 0.5 MG PO TABS
0.5000 mg | ORAL_TABLET | Freq: Every day | ORAL | Status: DC
  Administered 2020-10-18 – 2020-10-30 (×13): 0.5 mg via ORAL
  Filled 2020-10-18 (×13): qty 1

## 2020-10-18 MED ORDER — QUETIAPINE FUMARATE 50 MG PO TABS
150.0000 mg | ORAL_TABLET | Freq: Two times a day (BID) | ORAL | Status: DC
  Administered 2020-10-18 – 2020-10-31 (×26): 150 mg via ORAL
  Filled 2020-10-18 (×27): qty 3

## 2020-10-18 MED ORDER — ACETAMINOPHEN 160 MG/5ML PO SOLN
650.0000 mg | Freq: Four times a day (QID) | ORAL | Status: DC | PRN
  Administered 2020-10-18 – 2020-10-19 (×2): 650 mg
  Filled 2020-10-18 (×2): qty 20.3

## 2020-10-18 MED ORDER — BUSPIRONE HCL 5 MG PO TABS
10.0000 mg | ORAL_TABLET | Freq: Three times a day (TID) | ORAL | Status: DC
  Administered 2020-10-18 – 2020-10-31 (×39): 10 mg via ORAL
  Filled 2020-10-18 (×39): qty 2

## 2020-10-18 MED ORDER — FOLIC ACID 1 MG PO TABS
1.0000 mg | ORAL_TABLET | Freq: Every day | ORAL | Status: DC
  Administered 2020-10-19 – 2020-10-31 (×13): 1 mg via ORAL
  Filled 2020-10-18 (×13): qty 1

## 2020-10-18 MED ORDER — METOPROLOL TARTRATE 25 MG PO TABS
25.0000 mg | ORAL_TABLET | Freq: Every day | ORAL | Status: DC
  Administered 2020-10-19 – 2020-10-22 (×4): 25 mg via ORAL
  Filled 2020-10-18 (×4): qty 1

## 2020-10-18 MED ORDER — OXYCODONE HCL 5 MG PO TABS
5.0000 mg | ORAL_TABLET | ORAL | Status: DC | PRN
  Administered 2020-10-18 – 2020-10-30 (×8): 5 mg via ORAL
  Filled 2020-10-18 (×13): qty 1

## 2020-10-18 MED ORDER — MONTELUKAST SODIUM 10 MG PO TABS
10.0000 mg | ORAL_TABLET | Freq: Every day | ORAL | Status: DC
  Administered 2020-10-18 – 2020-10-30 (×13): 10 mg via ORAL
  Filled 2020-10-18 (×13): qty 1

## 2020-10-18 MED ORDER — ACETAMINOPHEN 650 MG RE SUPP: 650.0000 mg | Freq: Four times a day (QID) | RECTAL | Status: DC | PRN

## 2020-10-18 MED ORDER — QUETIAPINE FUMARATE 50 MG PO TABS
300.0000 mg | ORAL_TABLET | Freq: Every day | ORAL | Status: DC
  Administered 2020-10-18 – 2020-10-30 (×13): 300 mg via ORAL
  Filled 2020-10-18: qty 1
  Filled 2020-10-18 (×3): qty 2
  Filled 2020-10-18: qty 1
  Filled 2020-10-18 (×9): qty 2

## 2020-10-18 MED ORDER — BETHANECHOL CHLORIDE 25 MG PO TABS
50.0000 mg | ORAL_TABLET | Freq: Three times a day (TID) | ORAL | Status: DC
  Administered 2020-10-18 – 2020-10-23 (×14): 50 mg via ORAL
  Filled 2020-10-18 (×14): qty 2

## 2020-10-18 NOTE — Progress Notes (Signed)
PROGRESS NOTE   Subjective/Complaints:  Breathing is a little better. Upset that he was woken up early this morning for patient care.   ROS: Patient denies fever, rash, sore throat, blurred vision, nausea, vomiting, diarrhea, cough, shortness of breath or chest pain, joint or back pain, headache, or mood change.   Objective:   No results found. No results for input(s): WBC, HGB, HCT, PLT in the last 72 hours. No results for input(s): NA, K, CL, CO2, GLUCOSE, BUN, CREATININE, CALCIUM in the last 72 hours.  Intake/Output Summary (Last 24 hours) at 10/18/2020 1024 Last data filed at 10/18/2020 0850 Gross per 24 hour  Intake 1160 ml  Output 2375 ml  Net -1215 ml        Physical Exam: Vital Signs Blood pressure 138/77, pulse 78, temperature (!) 97.5 F (36.4 C), resp. rate 18, height 5\' 8"  (1.727 m), weight 81.9 kg, SpO2 95 %.   Constitutional: No distress . Vital signs reviewed. HEENT: EOMI, oral membranes moist Neck: supple Cardiovascular: RRR without murmur. No JVD    Respiratory/Chest: a few wheezes. Moving air better  GI/Abdomen: BS +, non-tender, non-distended Ext: no clubbing, cyanosis, or edema Psych: pleasant and cooperative Neurologic: Cranial nerves II through XII intact, motor strength is 3- Right and 4 /5 Left  deltoid, 4/5 bilateral bicep, tricep, grip, hip flexor, knee extensors, ankle dorsiflexor and plantar flexor Sensory exam normal sensation to light touch and proprioception in bilateral upper  extremities  Musculoskeletal: some pain with PROM RIght shoulder abd/flex    Assessment/Plan: 1. Functional deficits which require 3+ hours per day of interdisciplinary therapy in a comprehensive inpatient rehab setting.  Physiatrist is providing close team supervision and 24 hour management of active medical problems listed below.  Physiatrist and rehab team continue to assess barriers to discharge/monitor  patient progress toward functional and medical goals  Care Tool:  Bathing    Body parts bathed by patient: Right arm,Left arm,Chest,Abdomen,Front perineal area,Buttocks,Right upper leg,Left upper leg,Face   Body parts bathed by helper: Left lower leg,Right lower leg     Bathing assist Assist Level: Moderate Assistance - Patient 50 - 74%     Upper Body Dressing/Undressing Upper body dressing   What is the patient wearing?: Hospital gown only    Upper body assist Assist Level: Minimal Assistance - Patient > 75%    Lower Body Dressing/Undressing Lower body dressing      What is the patient wearing?: Hospital gown only     Lower body assist       Toileting Toileting    Toileting assist Assist for toileting: Minimal Assistance - Patient > 75%     Transfers Chair/bed transfer  Transfers assist     Chair/bed transfer assist level: Minimal Assistance - Patient > 75%     Locomotion Ambulation   Ambulation assist      Assist level: Minimal Assistance - Patient > 75% Assistive device: Hand held assist Max distance: 10   Walk 10 feet activity   Assist     Assist level: Minimal Assistance - Patient > 75% Assistive device: Hand held assist   Walk 50 feet activity   Assist  Assist level: 2 helpers Assistive device: Hand held assist    Walk 150 feet activity   Assist Walk 150 feet activity did not occur: Safety/medical concerns         Walk 10 feet on uneven surface  activity   Assist Walk 10 feet on uneven surfaces activity did not occur: Safety/medical concerns         Wheelchair     Assist Will patient use wheelchair at discharge?: No             Wheelchair 50 feet with 2 turns activity    Assist            Wheelchair 150 feet activity     Assist          Blood pressure 138/77, pulse 78, temperature (!) 97.5 F (36.4 C), resp. rate 18, height 5\' 8"  (1.727 m), weight 81.9 kg, SpO2 95 %.    Medical  Problem List and Plan: 1.Debilitysecondary to fall with 3 column T8 vertebral body fracture/T7 neural arch fracture/right TVP T4-8/right C7 transverse process fracture through the foramen. CTO to be applied in supine position -patient may shower with CTO -ELOS/Goals:10/31/20- supervision to min A             -Continue CIR therapies including PT, OT, and SLP  2. Antithrombotics: -DVT/anticoagulation:Lovenox.               -vascular study negative -antiplatelet therapy: Aspirin 81 mg daily 3. Pain Management:Oxycodone as needed             -?mild neuropathic pain RUE. ?C7 radic---continue gabapentin 100mg  tid   -could be shoulder, RTC also   -continue with support, ROM. 4. Mood:Wellbutrin 300 mg daily, BuSpar 10 mg 3 times daily, Klonopin 0.5 mg nightly, -antipsychotic agents: Seroquel 150 mg twice daily and 300 mg nightly             -behavior has been appropriate 5. Neuropsych: This patientis notcapable of making decisions on hisown behalf. 6. Skin/Wound Care:Routine skin checks 7. Fluids/Electrolytes/Nutrition:               -recent lab work is within normal limits.             -good po intake 8. Acute blood loss anemia. Follow-up CBC 9. Multiple bilateral rib fractures. Conservative care 10. 3: T8 vertebral body fracture. Follow-up neurosurgery Dr. 11/02/20. Nonoperative.              -CTO for HOB>30 degrees at this point-  ~7wks post fall  11. T7 neural arch fracture. Follow-up neurosurgery 12. Right TVP T4-8. Pain control follow neurosurgery 13. Right C7 TVP fracture through foramen. CTA negative. CTO 14. Large scalp laceration right eyebrow laceration. Repaired 08/17/2020. Sutures removed 15. Hypertension. Lopressor 25 mg twice daily 16. CAD/MI/DES. Continue aspirin 17. Acute hypercarbic ventilatory dependent respiratory failure/COPD. Tracheostomy 09/03/2020 per Dr. 10/17/2020. Changed back to #6  cuffless 10/05/2020 due to secretions. Patient will follow up long-term with Dr. Janee Morn           CBC CXR unremarkable   -5/31 back to 5L oxygen. Wbc's down yesterday. occ low grade temp, resp cx pend   -continue augmentin for now   -continue trach mgt, breathing treatments etc  6/2 breathing seems somewhat improved today, less work of breathing   -continue prednisone 10mg  bid   -continue above nebs, inhalers, O2   -will consult PCCM re: trachCOPD, ability to wean from trach.     -it appears  they were not actively following him on acute 18. Dysphagia. Gastrostomy tube 09/03/2020 per Dr. Janee Morn.              -Dysphagia #3 thin liquids--advance per SLP 19. BPH. Continue Hytrin/Flomax/Urecholine---really on near max doses              -Large bladder volumes             -in/out caths q4-6 hours---still not voiding.             -oob to void for all attempts---reiterated that to him today 20. History of alcohol tobacco abuse. Provide counseling 21. Constipation---+bm 5/30    LOS: 9 days A FACE TO FACE EVALUATION WAS PERFORMED  Ranelle Oyster 10/18/2020, 10:24 AM

## 2020-10-18 NOTE — Progress Notes (Signed)
Occupational Therapy Progress Note  Patient Details  Name: Charles Weeks MRN: [id number] Date of Birth: [birth year]  Today's Date: 10/18/2020 OT Individual Time: 1003-1100 OT Individual Time Calculation (min): 57 min   OT Short Term Goals Week 2:  OT Short Term Goal 1 (Week 2): Pt will instruct caregiver on donning TLSO with min cueing OT Short Term Goal 2 (Week 2): Patient will tolerate standing for 5 minutes in prep for BADL task OT Short Term Goal 3 (Week 2): Pt will use AE for LB dressing with min questioning cues  Skilled Therapeutic Interventions/Progress Updates:    Pt greeted semi-reclined in bed with bed at 45degrees and TLSO was not on. OT educated on importance of having TLSO on if [facility] is higher than 30 degrees. Also educated patients daughters on this. Worked on rolling L and R with min A to don back brace. Pt on 5L of O2 with SpO2 at 94%. Pt completed log roll and trunk elevation with min A. Worked on standing balance/endurance with standing toothbrushing task. Pt tolerated standing for 2 min with pt desat to 87%. Pt needed rest break to recover back to 90 %. Pt brought down to therapy gym and worked on standing endurance with standing connect 4 activity. Pt desat to 80 on 6L, Pt only recovering to 85% after 3 minutes and rest, requiring 8L of O2 to recover to 90. Functional ambulation and endurance with cone weaving using RW. Pt needed verbal and tactile cues to weave around cones and take his time while turning, min A. Pt desat to 77% on 8L. Pt still not recovering past 83% with extended rest break, requiring 10L and 5 minutes to get to 90. OT took O2 back down to 6 at rest and pt able to maintain SPO2 at 91%. Pt ambulated 5 feet in room to recliner w/ RW and min A. Pt left seated in recliner with chair alarm on, call bell in reach, and needs met.   Therapy Documentation Precautions:  Precautions Precautions: Cervical,Back Precaution Booklet Issued: Yes (comment) Precaution  Comments: trach collar, TLSO don with rolling if HOB >30,  abdominal binder to protect PEG, c-collar d/c'ed 5/24 Required Braces or Orthoses: Spinal Brace Cervical Brace: Hard collar,At all times Spinal Brace: Thoracolumbosacral orthotic,Applied in supine position Restrictions Weight Bearing Restrictions: No  Pain: Pain Assessment Pain Scale: 0-10 Pain Score: 0-No pain  Therapy/Group: Individual Therapy  Valma Cava 10/18/2020, 12:59 PM

## 2020-10-18 NOTE — Progress Notes (Signed)
Inpatient Rehabilitation Medication Review by a Pharmacist  A complete drug regimen review was completed for this patient to identify any potential clinically significant medication issues.  Clinically significant medication issues were identified:  no  Check AMION for pharmacist assigned to patient if future medication questions/issues arise during this admission.  Pharmacist comments:   Time spent performing this drug regimen review (minutes):   Buffey Zabinski S. Merilynn Finland, PharmD, BCPS Clinical Staff Pharmacist [email or web address] Pasty Spillers 10/18/2020 3:46 PM

## 2020-10-18 NOTE — Progress Notes (Signed)
Physical Therapy Session Note  Patient Details  Name: Charles Weeks MRN: [id number] Date of Birth: 10-Jul-1965  Today's Date: 10/18/2020 PT Individual Time: 1130-1208 PT Individual Time Calculation (min): 38 min   Short Term Goals: Week 1:  PT Short Term Goal 1 (Week 1): Pt will perform sit to stand with CGA PT Short Term Goal 2 (Week 1): Pt will perform bed to chair transfer with CGA. PT Short Term Goal 3 (Week 1): Pt will ambulate 100' with CGA and LRAD. Week 2:    Week 3:     Skilled Therapeutic Interventions/Progress Updates:    Pain:  Pt reports "I am  Ok" when inquired re: pain.  Treatment to tolerance.  Rest breaks and repositioning as needed.  Pt initially oob in recliner w/nursing in room.  Reports pt needs to be catherterized, pt insisting urgent need.  15 min delay, 7 min missed time due to this.  Returned x 15 min and pt agreeable to treatment session w/focus on gait /cardiovascular endurance  stand pivot transfer recliner to wc w/cga.  Once in wc, pt transported to gym for continued session.  Gait trials: 33ft w/RW, cga, 02at 6L nia trach collar 02 sats 93%, HR 98  Gait 32ft w/RW as above 02 Sats 90%, HR 98  Gait 73ft as above 02Sats 89% HR 101  Gait 135ft w/wc follow, turn/sit to recliner w/cga 02sats 80% HR 103 Returns to 90s within 1.5 min, hr recovers to 90 in same time.  After several min seated rest, pt w/coughing spell, very difficult to take insppiration, heavy wheezing, pt very reddened, therapist monitored 02 decrease to 89%, HR which increased to 100,  Therapist assured pt until pt recovered and was able to speak w/ease.  Nursing notified.   Therapy Documentation Precautions:  Precautions Precautions: Cervical,Back Precaution Booklet Issued: Yes (comment) Precaution Comments: trach collar, TLSO don with rolling if HOB >30,  abdominal binder to protect PEG, c-collar d/c'ed 5/24 Required Braces or Orthoses: Spinal Brace Cervical Brace: Hard  collar,At all times Spinal Brace: Thoracolumbosacral orthotic,Applied in supine position Restrictions Weight Bearing Restrictions: No    Therapy/Group: Individual Therapy  Rada Hay, PT   [name] 10/18/2020, 12:14 PM

## 2020-10-18 NOTE — Progress Notes (Signed)
Physical Therapy Weekly Progress Note  Patient Details  Name: Charles Weeks MRN: [id number] Date of Birth: [birth year]  Beginning of progress report period: Oct 10, 2020 End of progress report period: October 18, 2020  Today's Date: 10/18/2020 PT Individual Time: 4098-1191 PT Individual Time Calculation (min): 54 min   Patient has met 2 of 3 short term goals.  Pt is progressing well toward mobility goals, improving independence with bed mobility, transfers, and ambulation. Pt continues to require totalA to don TLSO and has been limited in progress due to decreased cardiorespiratory support and endurance deficits. Pt requires frequent rest breaks and has had fluctuating oxygen requirements, and has also missed multiple PT sessions due to fatigue. Pt generally requiring minA/CGA for all mobility and most notable mobility deficits are balance and endurance.  Patient continues to demonstrate the following deficits muscle weakness, decreased cardiorespiratoy endurance and decreased oxygen support and decreased sitting balance, decreased standing balance, decreased postural control and decreased balance strategies and therefore will continue to benefit from skilled PT intervention to increase functional independence with mobility.  Patient progressing toward long term goals..  Continue plan of care.  PT Short Term Goals Week 1:  PT Short Term Goal 1 (Week 1): Pt will perform sit to stand with CGA PT Short Term Goal 1 - Progress (Week 1): Met PT Short Term Goal 2 (Week 1): Pt will perform bed to chair transfer with CGA. PT Short Term Goal 2 - Progress (Week 1): Met PT Short Term Goal 3 (Week 1): Pt will ambulate 100' with CGA and LRAD. PT Short Term Goal 3 - Progress (Week 1): Progressing toward goal Week 2:  PT Short Term Goal 1 (Week 2): STGs = LTGs  Skilled Therapeutic Interventions/Progress Updates:  Ambulation/gait training;Community reintegration;DME/adaptive equipment instruction;Neuromuscular  re-education;Psychosocial support;Stair training;UE/LE Strength taining/ROM;Wheelchair propulsion/positioning;Balance/vestibular training;Discharge planning;Functional electrical stimulation;Pain management;Skin care/wound management;Therapeutic Activities;UE/LE Coordination activities;Cognitive remediation/compensation;Disease management/prevention;Functional mobility training;Patient/family education;Splinting/orthotics;Therapeutic Exercise;Visual/perceptual remediation/compensation   Pt received seated in recliner and agrees to therapy, though reports fatigue. Sit to stand with CGA. Pt ambulates x140' to dayroom with minA and cues for upright gaze to improve posture and balance. Pt requires extended seated rest break following ambulation. Vitals taken and pt's oxygen ~85% on 6L O2 via trach collar. Sats slowly increase to ~93%. Pt performs Nustep activity with cues to exert self for 1 minute at a time, and rest until breathing becomes unlabored before performing additional exercise. Pt complete 8x1:00 at workload of 4 with average steps per minute ~55. On average pt requires ~3-4 minutes rest break between each bout. Performed for strength and endurance training as well as reciprocal coordination. Pt verbalizes that he is too fatigued to attempt ambulation back to room following session. Stand step from Nustep>WC>recliner with CGA. Left with alarm intact and all needs within reach.  Therapy Documentation Precautions:  Precautions Precautions: Cervical,Back Precaution Booklet Issued: Yes (comment) Precaution Comments: trach collar, TLSO don with rolling if HOB >30,  abdominal binder to protect PEG, c-collar d/c'ed 5/24 Required Braces or Orthoses: Spinal Brace Cervical Brace: Hard collar,At all times Spinal Brace: Thoracolumbosacral orthotic,Applied in supine position Restrictions Weight Bearing Restrictions: No  Therapy/Group: Individual Therapy  Breck Coons, PT, DPT 10/18/2020, 4:17 PM

## 2020-10-18 NOTE — Progress Notes (Signed)
Speech Language Pathology TBI Note  Patient Details  Name: Charles Weeks MRN: [id number] Date of Birth: Sep 08, 1965  Today's Date: 10/18/2020 SLP Individual Time: 1300-1345 SLP Individual Time Calculation (min): 45 min  Short Term Goals: Week 2: SLP Short Term Goal 1 (Week 2): Patient will consume current diet with minimal overt s/s of aspiration and overall Mod I for use of swallowing compensatory strategies. SLP Short Term Goal 2 (Week 2): Patient will demonstrate efficient mastiction and complete oral clearance with trials of regular textures over 2 sessions prior to upgrade with supervision level verbal cues. SLP Short Term Goal 3 (Week 2): Patient will utilize speech intelliigibility strategies at the sentence level to achieve ~90% intelligibility with supervision verbal cues. SLP Short Term Goal 4 (Week 2): Patient will recall functional information with supervision level verbal cues for use of strategies.  Skilled Therapeutic Interventions: Skilled treatment session focused on communication and dysphagia goals. Upon arrival, patient was awake in recliner with PMSV in place. SLP facilitated session by providing Mod verbal and visual cues for use of diaphragmatic breathing while sitting upright in the recliner.  When utilizing diaphragmatic breathing, patient was able to demonstrate efficient vocal intensity at the phrase level to achieve 100% intelligibility. Patient consumed thin liquids during session due to frequent reports of xerostomia. No overt s/s of aspiration noted. Provided education on importance of practicing diaphragmatic breathing while resting in bed/recliner. He verbalized understanding. Patient left upright in recliner with alarm on and all needs within reach. Continue with current plan of care.      Pain No/Denies Pain   Agitated Behavior Scale: TBI Observation Details Observation Environment: Patient's room Start of observation period - Date: 10/18/20 Start of  observation period - Time: 1300 End of observation period - Date: 10/18/20 End of observation period - Time: 1345 Agitated Behavior Scale (DO NOT LEAVE BLANKS) Short attention span, easy distractibility, inability to concentrate: Absent Impulsive, impatient, low tolerance for pain or frustration: Absent Uncooperative, resistant to care, demanding: Absent Violent and/or threatening violence toward people or property: Absent Explosive and/or unpredictable anger: Absent Rocking, rubbing, moaning, or other self-stimulating behavior: Absent Pulling at tubes, restraints, etc.: Absent Wandering from treatment areas: Absent Restlessness, pacing, excessive movement: Absent Repetitive behaviors, motor, and/or verbal: Absent Rapid, loud, or excessive talking: Absent Sudden changes of mood: Absent Easily initiated or excessive crying and/or laughter: Absent Self-abusiveness, physical and/or verbal: Absent Agitated behavior scale total score: 14  Therapy/Group: Individual Therapy  Charles Weeks 10/18/2020, 3:30 PM

## 2020-10-19 LAB — CBC
HCT: 39.2 % (ref 39.0–52.0)
Hemoglobin: 12 g/dL — ABNORMAL LOW (ref 13.0–17.0)
MCH: 28 pg (ref 26.0–34.0)
MCHC: 30.6 g/dL (ref 30.0–36.0)
MCV: 91.4 fL (ref 80.0–100.0)
Platelets: 345 10*3/uL (ref 150–400)
RBC: 4.29 MIL/uL (ref 4.22–5.81)
RDW: 16.2 % — ABNORMAL HIGH (ref 11.5–15.5)
WBC: 9.7 10*3/uL (ref 4.0–10.5)
nRBC: 0 % (ref 0.0–0.2)

## 2020-10-19 LAB — CULTURE, RESPIRATORY W GRAM STAIN

## 2020-10-19 LAB — GLUCOSE, CAPILLARY
Glucose-Capillary: 145 mg/dL — ABNORMAL HIGH (ref 70–99)
Glucose-Capillary: 164 mg/dL — ABNORMAL HIGH (ref 70–99)

## 2020-10-19 MED ORDER — TAMSULOSIN HCL 0.4 MG PO CAPS
0.4000 mg | ORAL_CAPSULE | Freq: Two times a day (BID) | ORAL | Status: DC
  Administered 2020-10-19 – 2020-10-31 (×24): 0.4 mg via ORAL
  Filled 2020-10-19 (×24): qty 1

## 2020-10-19 NOTE — Progress Notes (Signed)
Occupational Therapy Session Note  Patient Details  Name: Charles Weeks MRN: [id number] Date of Birth: Mar 09, 1966  Today's Date: 10/19/2020 OT Individual Time: 9244-6286 OT Individual Time Calculation (min): 69 min   Short Term Goals: Week 2:  OT Short Term Goal 1 (Week 2): Pt will instruct caregiver on donning TLSO with min cueing OT Short Term Goal 2 (Week 2): Patient will tolerate standing for 5 minutes in prep for BADL task OT Short Term Goal 3 (Week 2): Pt will use AE for LB dressing with min questioning cues  Skilled Therapeutic Interventions/Progress Updates:    Pt greeted seated in recliner and agreeable to OT treatment session focused on self-care retraining. Pt stood without AD and CGA, then ambulated to the sink. LB bathing completed sit<>stand with CGA and verbal cues to bring LEs up and not bend down. OT reviewed back precautions again with patient and how back precautions will affect how pt performs his BADL tasks. OT provided pt with reacher to help thread pants and underwear, although he was reluctant to use AE. OT reiterated importance of NOT bending within BADLs. Pt able to stand and pull up pants w/ CGA. OT demonstrated sock-aid, and pt able to complete with min A to get sock on sock-aid correctly. Pt then transferred back to bed with CGA. Verbal cues for log roll back to bed. Rolling with supervision to doff TLSO. OT assist to doff shirt but pt assisted with bed mobility and pulling up shirt when he could . Pt able to wash underarms and chest/stomach but OT assist to wash back> Worked on pt directing care on how to don shirt. Rolling again to don TLSO total A. Pt reported need to go to the bathroom. Pt completed log roll with verbal cues and supervision. Pt ambulated into bathroom without AD and min A. Pt sat on commode and had successful BM and voided some bladder. Pt able to complete peri-care with set-up and CGA for balance. Pt ambulated back to sit in recliner and left seated  in recliner with call bell in reach and needs met. Pt on 6L of O2 throughout session with SpO2 above 90.  Therapy Documentation Precautions:  Precautions Precautions: Cervical,Back Precaution Booklet Issued: Yes (comment) Precaution Comments: trach collar, TLSO don with rolling if HOB >30,  abdominal binder to protect PEG, c-collar d/c'ed 5/24 Required Braces or Orthoses: Spinal Brace Cervical Brace: Hard collar,At all times Spinal Brace: Thoracolumbosacral orthotic,Applied in supine position Restrictions Weight Bearing Restrictions: No Vital Signs: Therapy Vitals Pulse Rate: 98 (working with OT) Resp: 20 Patient Position (if appropriate): Standing Oxygen Therapy SpO2: 99 % O2 Device: Tracheostomy Collar O2 Flow Rate (L/min): 5 L/min FiO2 (%): 28 % Pain: Pain Assessment Pain Scale: 0-10 Pain Score: 0-No pain     Therapy/Group: Individual Therapy  Valma Cava 10/19/2020, 10:30 AM

## 2020-10-19 NOTE — Progress Notes (Signed)
Occupational Therapy Session Note  Patient Details  Name: Charles Weeks MRN: [id number] Date of Birth: 02/08/1966  Today's Date: 10/19/2020 OT Individual Time: 2683-4196 OT Individual Time Calculation (min): 31 min    Short Term Goals: Week 2:  OT Short Term Goal 1 (Week 2): Pt will instruct caregiver on donning TLSO with min cueing OT Short Term Goal 2 (Week 2): Patient will tolerate standing for 5 minutes in prep for BADL task OT Short Term Goal 3 (Week 2): Pt will use AE for LB dressing with min questioning cues  Skilled Therapeutic Interventions/Progress Updates:    Pt in bed with nursing present to administer medications.  He then needed total assist for donning his TLSO in supine rolling.  He was able to roll with supervision side to side with use of the rails.  Supervision also for transfer to sitting EOB where final adjustments were made to the brace.  O2 sats throughout session at 90% or greater on 5Ls 28% trach collar.  He was able to complete sit to stand from the bed as well as the bedside recliner with min guard, maintaining static standing at the same level for up to 3 mins.  O2 sats remained greater than 90%.  He was able to complete transfer over to the sink to comb his hair with min guard but needed min assist for squat to stand and standing in tandem with LOB in both directions.  Finished session with pt in the recliner and with the call button and phone in reach and respiratory therapy in room.  Safety alarm in place as well.    Therapy Documentation Precautions:  Precautions Precautions: Back Precaution Booklet Issued: Yes (comment) Precaution Comments: trach collar, TLSO don with rolling if HOB >30,  abdominal binder to protect PEG, c-collar d/c'ed 5/24 Required Braces or Orthoses: Spinal Brace Cervical Brace: Hard collar,At all times Spinal Brace: Thoracolumbosacral orthotic,Applied in supine position Restrictions Weight Bearing Restrictions: No  Pain: Pain  Assessment Pain Scale: Faces Pain Score: 8  Faces Pain Scale: Hurts little more Pain Type: Acute pain Pain Location: Back Pain Orientation: Lower Pain Descriptors / Indicators: Discomfort Pain Onset: With Activity Pain Intervention(s): Repositioned ADL: See Care Tool Section for some details of mobility and selfcare  Therapy/Group: Individual Therapy  Charles Weeks OTR/L 10/19/2020, 12:09 PM

## 2020-10-19 NOTE — Procedures (Signed)
Tracheostomy Change Note  Patient Details:   Name: Charles Weeks DOB: 08-Jan-1966 MRN: [id number]    Airway Documentation:     Evaluation  O2 sats: stable throughout Complications: No apparent complications Patient did tolerate procedure well. Bilateral Breath Sounds: Diminished,Expiratory wheezes   Pt tolerated procedure well. RT X2 exchanged trach without complications and positive color change on End tidal. Pt able to cough to clear secretions after as well as speak with PMV on    Tacy Learn 10/19/2020, 4:16 PM

## 2020-10-19 NOTE — Progress Notes (Signed)
PROGRESS NOTE   Subjective/Complaints:  Had more of a restful night. Breathing has been steady. Seems to be better since we added prednisone. Still requiring 5L  ROS: Patient denies fever, rash, sore throat, blurred vision, nausea, vomiting, diarrhea, cough,   chest pain,  headache, or mood change.    Objective:   No results found. Recent Labs    10/19/20 0618  WBC 9.7  HGB 12.0*  HCT 39.2  PLT 345   No results for input(s): NA, K, CL, CO2, GLUCOSE, BUN, CREATININE, CALCIUM in the last 72 hours.  Intake/Output Summary (Last 24 hours) at 10/19/2020 1059 Last data filed at 10/19/2020 0824 Gross per 24 hour  Intake 1680 ml  Output 3675 ml  Net -1995 ml        Physical Exam: Vital Signs Blood pressure 133/83, pulse 98, temperature 97.8 F (36.6 C), temperature source Oral, resp. rate 20, height 5\' 8"  (1.727 m), weight 81.9 kg, SpO2 99 %.   Constitutional: No distress . Vital signs reviewed. HEENT: EOMI, oral membranes moist Neck: #6 trach with PMV Cardiovascular: RRR without murmur. No JVD    Respiratory/Chest: a few scattered rhonchi. Non-labored. No wheezing today   GI/Abdomen: BS +, non-tender, non-distended. PEG intact with sl drainage Ext: no clubbing, cyanosis, or edema Psych: flat but cooperative Skin: scattered abrasions,  Neurologic: Cranial nerves II through XII intact, motor strength is 3- Right and 4 /5 Left  deltoid, 4/5 bilateral bicep, tricep, grip, hip flexor, knee extensors, ankle dorsiflexor and plantar flexor Sensory exam normal sensation to light touch and proprioception in bilateral upper  extremities  Musculoskeletal: some pain with PROM RIght shoulder abd/flex    Assessment/Plan: 1. Functional deficits which require 3+ hours per day of interdisciplinary therapy in a comprehensive inpatient rehab setting.  Physiatrist is providing close team supervision and 24 hour management of active  medical problems listed below.  Physiatrist and rehab team continue to assess barriers to discharge/monitor patient progress toward functional and medical goals  Care Tool:  Bathing    Body parts bathed by patient: Right arm,Left arm,Chest,Abdomen,Front perineal area,Buttocks,Right upper leg,Left upper leg,Face   Body parts bathed by helper: Left lower leg,Right lower leg     Bathing assist Assist Level: Moderate Assistance - Patient 50 - 74%     Upper Body Dressing/Undressing Upper body dressing   What is the patient wearing?: Hospital gown only    Upper body assist Assist Level: Minimal Assistance - Patient > 75%    Lower Body Dressing/Undressing Lower body dressing      What is the patient wearing?: Hospital gown only     Lower body assist       Toileting Toileting    Toileting assist Assist for toileting: Minimal Assistance - Patient > 75%     Transfers Chair/bed transfer  Transfers assist     Chair/bed transfer assist level: Contact Guard/Touching assist     Locomotion Ambulation   Ambulation assist      Assist level: Contact Guard/Touching assist Assistive device: Walker-rolling Max distance: 100   Walk 10 feet activity   Assist     Assist level: Contact Guard/Touching assist Assistive device: Walker-rolling  Walk 50 feet activity   Assist    Assist level: Contact Guard/Touching assist Assistive device: Walker-rolling    Walk 150 feet activity   Assist Walk 150 feet activity did not occur: Safety/medical concerns         Walk 10 feet on uneven surface  activity   Assist Walk 10 feet on uneven surfaces activity did not occur: Safety/medical concerns         Wheelchair     Assist Will patient use wheelchair at discharge?: No             Wheelchair 50 feet with 2 turns activity    Assist            Wheelchair 150 feet activity     Assist          Blood pressure 133/83, pulse 98,  temperature 97.8 F (36.6 C), temperature source Oral, resp. rate 20, height 5\' 8"  (1.727 m), weight 81.9 kg, SpO2 99 %.    Medical Problem List and Plan: 1.Debilitysecondary to fall with 3 column T8 vertebral body fracture/T7 neural arch fracture/right TVP T4-8/right C7 transverse process fracture through the foramen. CTO to be applied in supine position -patient may shower with CTO -ELOS/Goals:10/31/20- supervision to min A            -Continue CIR therapies including PT, OT, and SLP  2. Antithrombotics: -DVT/anticoagulation:Lovenox.               -vascular study negative -antiplatelet therapy: Aspirin 81 mg daily 3. Pain Management:Oxycodone as needed             -?mild neuropathic pain RUE. ?C7 radic---continue gabapentin 100mg  tid   -could be shoulder, RTC also   -continue with support, ROM for now. 4. Mood:Wellbutrin 300 mg daily, BuSpar 10 mg 3 times daily, Klonopin 0.5 mg nightly, -antipsychotic agents: Seroquel 150 mg twice daily and 300 mg nightly             -behavior has been appropriate 5. Neuropsych: This patientis notcapable of making decisions on hisown behalf. 6. Skin/Wound Care:Routine skin checks 7. Fluids/Electrolytes/Nutrition:               -recent lab work is within normal limits.             -good po intake 8. Acute blood loss anemia. Follow-up CBC 9. Multiple bilateral rib fractures. Conservative care 10. 3: T8 vertebral body fracture. Follow-up neurosurgery Dr. 11/02/20. Nonoperative.              -CTO for HOB>30 degrees at this point-  ~7wks post fall  11. T7 neural arch fracture. Follow-up neurosurgery 12. Right TVP T4-8. Pain control follow neurosurgery 13. Right C7 TVP fracture through foramen. CTA negative. CTO 14. Large scalp laceration right eyebrow laceration. Repaired 08/17/2020. Sutures removed 15. Hypertension. Lopressor 25 mg twice daily 16. CAD/MI/DES. Continue  aspirin 17. Acute hypercarbic ventilatory dependent respiratory failure/COPD. Tracheostomy 09/03/2020 per Dr. 10/17/2020. Changed back to #6 cuffless 10/05/2020 due to secretions. Patient will follow up long-term with Dr. Janee Morn           CBC CXR unremarkable   -5/31 back to 5L oxygen. Wbc's down yesterday. occ low grade temp, resp cx pend   -continue augmentin for now   -continue trach mgt, breathing treatments etc  6/2-3 breathing seems generally improved, less work of breathing   -trach gs/cx with WBC's rare stenotrophomonas   -continue prednisone 10mg  bid and augmenting   -continue above  nebs, inhalers, O2   - PCCM to see today re: trachCOPD, ability to wean from trach.     18. Dysphagia. Gastrostomy tube 09/03/2020 per Dr. Janee Morn.              -Tolerating Dysphagia #3 thin liquids--advance per SLP 19. BPH. Continue Hytrin/Flomax/Urecholine---really on near max doses              -Large bladder volumes             -in/out caths q4-6 hours---still not voiding.             -oob to void for all attempts   6/3 increase flomax to bid 20. History of alcohol tobacco abuse. Provide counseling 21. Constipation---+bm 5/30    LOS: 10 days A FACE TO FACE EVALUATION WAS PERFORMED  Ranelle Oyster 10/19/2020, 10:59 AM

## 2020-10-19 NOTE — Progress Notes (Signed)
Speech Language Pathology TBI Note  Patient Details  Name: Charles Weeks MRN: [id number] Date of Birth: 11/15/1965  Today's Date: 10/19/2020 SLP Individual Time: 1300-1340 SLP Individual Time Calculation (min): 40 min  Short Term Goals: Week 2: SLP Short Term Goal 1 (Week 2): Patient will consume current diet with minimal overt s/s of aspiration and overall Mod I for use of swallowing compensatory strategies. SLP Short Term Goal 2 (Week 2): Patient will demonstrate efficient mastiction and complete oral clearance with trials of regular textures over 2 sessions prior to upgrade with supervision level verbal cues. SLP Short Term Goal 3 (Week 2): Patient will utilize speech intelliigibility strategies at the sentence level to achieve ~90% intelligibility with supervision verbal cues. SLP Short Term Goal 4 (Week 2): Patient will recall functional information with supervision level verbal cues for use of strategies.  Skilled Therapeutic Interventions: Skilled treatment session focused on communication goals. Upon arrival, PMSV was in place and all vitals remaining WFL throughout session. Patient participated in a verbal description task at the sentence level that focused on use of diaphrgamatic breathing. Patient utilized appropriate breathing strategies with Min verbal cues and was ~90% intelligible. Patient consumed thin liquids via straw without overt s/s of aspiration. Recommend patient continue current diet. Patient left upright in recliner with alarm on and all needs within reach. Continue with current plan of care.      Pain Pain Assessment Pain Scale: Faces Pain Score: 8  Faces Pain Scale: Hurts little more Pain Type: Acute pain Pain Location: Back Pain Orientation: Lower Pain Descriptors / Indicators: Discomfort Pain Onset: With Activity Pain Intervention(s): Repositioned  Agitated Behavior Scale: TBI Observation Details Observation Environment: Patient's room Start of  observation period - Date: 10/19/20 Start of observation period - Time: 1300 End of observation period - Date: 10/19/20 End of observation period - Time: 1340 Agitated Behavior Scale (DO NOT LEAVE BLANKS) Short attention span, easy distractibility, inability to concentrate: Absent Impulsive, impatient, low tolerance for pain or frustration: Absent Uncooperative, resistant to care, demanding: Absent Violent and/or threatening violence toward people or property: Absent Explosive and/or unpredictable anger: Absent Rocking, rubbing, moaning, or other self-stimulating behavior: Absent Pulling at tubes, restraints, etc.: Absent Wandering from treatment areas: Absent Restlessness, pacing, excessive movement: Absent Repetitive behaviors, motor, and/or verbal: Absent Rapid, loud, or excessive talking: Absent Sudden changes of mood: Absent Easily initiated or excessive crying and/or laughter: Absent Self-abusiveness, physical and/or verbal: Absent Agitated behavior scale total score: 14  Therapy/Group: Individual Therapy  Charles Weeks 10/19/2020, 2:26 PM

## 2020-10-19 NOTE — Progress Notes (Signed)
Physical Therapy Session Note  Patient Details  Name: Charles Weeks MRN: [id number] Date of Birth: 23-Apr-1966  Today's Date: 10/19/2020 PT Individual Time: 1400-1500 PT Individual Time Calculation (min): 60 min   Short Term Goals: Week 2:  PT Short Term Goal 1 (Week 2): STGs = LTGs  Skilled Therapeutic Interventions/Progress Updates:     Pt received seated in recliner and agrees to therapy. No complaint of pain. Stand step transfer to [facility] with CGA. Pt on 5-6L O2 28% FiO2 via trach collar. WC transport to gym for time management. Pt trials rollator for balance support as well as assistive device for energy conservation. Initially, PT demos rollator and safe use. Pt then ambulates x140' with rollator and CGA. O2 following ambulation 85% and requires 2-3 minutes to increase to >90%. Following extended seated rest break, pt ambulates additional 180'. PT cues for safe locking of rollator brakes and cues pt to take rest break for energy conservation. Pt ambulates 2x175' with seated rest break. O2 monitored and down to 84% between bouts with HR at 100 bpm. Following 2nd bout pt's sats decrease to 81%. PT increases oxygen to 8L and pt recovers within 1-2 minutes. Pt verbalizes significant fatigue and requests to return to room. Pt ambulates back to room without AD with CGA. Left seated in recliner with alarm intact and all needs within reach.  Therapy Documentation Precautions:  Precautions Precautions: Back Precaution Booklet Issued: Yes (comment) Precaution Comments: trach collar, TLSO don with rolling if HOB >30,  abdominal binder to protect PEG, c-collar d/c'ed 5/24 Required Braces or Orthoses: Spinal Brace Cervical Brace: Hard collar,At all times Spinal Brace: Thoracolumbosacral orthotic,Applied in supine position Restrictions Weight Bearing Restrictions: No   Therapy/Group: Individual Therapy  Beau Fanny, PT, DPT 10/19/2020, 6:34 PM

## 2020-10-19 NOTE — Consult Note (Signed)
NAME:  Charles Weeks, MRN:  [id number], DOB:  08/10/1965, LOS: 10 ADMISSION DATE:  10/09/2020, CONSULTATION DATE:  6/3 REFERRING MD:  Riley Kill, CHIEF COMPLAINT:  Trach management and COPD  History of Present Illness:  55 year old male who we follow in clinic for GOLD IV COPD (FEV1 27%). Just discharged from [facility] 5/24 to in-patient rehab after he sustained multiple traumatic injuries from a fall on 4/1 (C7 transverse process fx, 3 column T8 fracture, T7 neuro arch fracture, multiple rib fractures w/ hemo-pneumothorax requiring chest tube). This resulted in prolonged need for mechanical ventilation w/ hosp course c/b difficulty weaning, encephalopathy and ultimately trach/peg.   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  Most recent hospital course also complicated by agitated delirium   Significant Hospital Events: Including procedures, antibiotic start and stop dates in addition to other pertinent events   . 5/24 admitted to in-pt rehab. At time of admit was using PMV and tolerating diet. Had cuffless trach (changed from cuff on 5/20) . 5/24 - 6/3 on 5/28 sats low req increased oxygen needing to go back on ATC. Started on augmentin 5/29 for tracheobronchitis. 6/1 seen by neuro-psych for depression and anxiety. Working w/ PT and OT. Tolerating rehab efforts. Trach team asked to see on 6/3   Interim History / Subjective:  Feels well  Objective   Blood pressure 133/83, pulse 98, temperature 97.8 F (36.6 C), temperature source Oral, resp. rate 20, height 5\' 8"  (1.727 m), weight 81.9 kg, SpO2 99  %.    FiO2 (%):  [28 %] 28 %   Intake/Output Summary (Last 24 hours) at 10/19/2020 1118 Last data filed at 10/19/2020 12/19/2020 Gross per 24 hour  Intake 1680 ml  Output 3675 ml  Net -1995 ml   Filed Weights   10/09/20 1640 10/14/20 1410  Weight: 80.5 kg 81.9 kg    Examination: General: 55 year old white male up in chair HENT: NCAT does develop mild UAW wheezing w/ trach finger occlusion Lungs: decreased t/o Cardiovascular: rrr Abdomen: soft not tender Extremities: warm and dry  Neuro: intact GU: voids  Labs/imaging that I havepersonally reviewed  (right click and "Reselect all SmartList Selections" daily)    Resolved Hospital Problem list     Assessment & Plan:   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 He does have fairly significant underlying lung disease and his activity tolerance is limited at baseline but seems like he is making gains here as well. He is currently completing treatment for AECOPD w/ purulent bronchitis for which he is clinically better.  My only reservation about decannulation is he does seem to have fairly sig upper airway wheezing w/ tracheal occlusion. This could just be mucous occluding the trach a little more distally but also this could reflect some tracheal stenosis.   Plan Complete 5 d total of abx (seems like he got better w/ the augmentin so no need to treat the stenotrophamonas) Steroid taper Cont BDs Change trach to size 4 and initiate  capping trials over weekend w/ cont pulse-ox.  Will ask that nursing and RT record in progress note any time they need to sxn him but otherwise encourage him to cough and expectorate.  If he does not tolerate capping trials he will need ENT eval to look for evidence of tracheal stenosis    Best practice  (right click and "Reselect all SmartList Selections" daily)  See below  Labs   CBC: Recent Labs  Lab 10/14/20 1009 10/15/20 0715 10/19/20 0618  WBC 11.9* 9.0 9.7  HGB 11.8* 11.6* 12.0*  HCT 38.3* 37.5* 39.2  MCV 91.6 92.1 91.4  PLT 351 321 345    Basic Metabolic Panel: No results for input(s): NA, K, CL, CO2, GLUCOSE, BUN, CREATININE, CALCIUM, MG, PHOS in the last 168 hours. GFR: Estimated Creatinine Clearance: 95 mL/min (by C-G formula based on SCr of 0.85 mg/dL). Recent Labs  Lab 10/14/20 1009 10/15/20 0715 10/19/20 0618  WBC 11.9* 9.0 9.7    Liver Function Tests: No results for input(s): AST, ALT, ALKPHOS, BILITOT, PROT, ALBUMIN in the last 168 hours. No results for input(s): LIPASE, AMYLASE in the last 168 hours. No results for input(s): AMMONIA in the last 168 hours.  ABG    Component Value Date/Time   PHART 7.359 08/20/2020 1039   PCO2ART 55.9 (H) 08/20/2020 1039   PO2ART 73 (L) 08/20/2020 1039   HCO3 31.4 (H) 08/20/2020 1039   TCO2 33 (H) 08/20/2020 1039   ACIDBASEDEF 4.0 (H) 08/18/2020 1421   O2SAT 93.0 08/20/2020 1039     Coagulation Profile: No results for input(s): INR, PROTIME in the last 168 hours.  Cardiac Enzymes: No results for input(s): CKTOTAL, CKMB, CKMBINDEX, TROPONINI in the last 168 hours.  HbA1C: Hgb A1c MFr Bld  Date/Time Value Ref Range Status  08/19/2020 09:15 AM 6.2 (H) 4.8 - 5.6 % Final    Comment:    (NOTE) Pre diabetes:          5.7%-6.4%  Diabetes:              >6.4%  Glycemic control for   <7.0% adults with diabetes   11/05/2019 06:48 AM 6.3 (H) 4.8 - 5.6 % Final    Comment:    (NOTE) Pre diabetes:          5.7%-6.4%  Diabetes:              >6.4%  Glycemic control for   <7.0% adults with diabetes     CBG: Recent Labs  Lab 10/16/20 1957 10/17/20 0152 10/17/20 0548 10/18/20 0615 10/19/20 0621  GLUCAP 134* 130* 127* 134* 145*    Review of Systems:   Review of Systems  Constitutional: Negative for  fever, malaise/fatigue and weight loss.  HENT: Positive for congestion.   Eyes: Negative.   Respiratory: Positive for shortness of breath and wheezing. Negative for cough and sputum production.   Cardiovascular: Positive for chest pain. Negative for leg swelling.  Gastrointestinal: Negative.   Genitourinary: Negative.   Musculoskeletal: Negative.   Skin: Negative.   Neurological: Negative.   Endo/Heme/Allergies: Negative.   Psychiatric/Behavioral: Negative.      Past Medical History:  He,  has a past medical history of Anxiety, Anxiety (01/19/2020), Arthritis, Benign prostatic hyperplasia with weak urinary stream (12/08/2019), BPH (benign prostatic hyperplasia) (08/17/2020), CAD (coronary artery disease) (08/17/2020), Chronic respiratory failure with hypoxia (HCC) (01/24/2020), Cigarette smoker (01/24/2020), COPD (chronic obstructive pulmonary disease) (HCC), COPD (chronic obstructive pulmonary disease) (HCC) (08/17/2020), COPD GOLD ? , Coronary artery  disease (06/08/2018), Depression, Depression, major, single episode, moderate (HCC) (01/19/2020), Encounter for screening for malignant neoplasm of colon (12/08/2019), Essential hypertension, Essential hypertension (12/08/2019), History of MI (myocardial infarction) (01/19/2020), HTN (hypertension) (08/17/2020), Myocardial infarct [facility]), Myocardial infarct (HCC), and Respiratory failure with hypoxia (HCC).   Surgical History:   Past Surgical History:  Procedure Laterality Date  . BOWEL RESECTION  2006  . PEG PLACEMENT N/A 09/03/2020   Procedure: PERCUTANEOUS ENDOSCOPIC GASTROSTOMY (PEG) PLACEMENT;  Surgeon: Violeta Gelinas, MD;  Location: [facility] OR;  Service: General;  Laterality: N/A;  . PERCUTANEOUS TRACHEOSTOMY N/A 09/03/2020   Procedure: PERCUTANEOUS TRACHEOSTOMY USING 6 SHILEY;  Surgeon: Violeta Gelinas, MD;  Location: [facility] OR;  Service: General;  Laterality: N/A;  . SKIN GRAFT    . TONSILLECTOMY AND ADENOIDECTOMY       Social History:   reports that  he has been smoking. He has never used smokeless tobacco. He reports current alcohol use. He reports previous drug use.   Family History:  His family history includes Diabetes in his sister; Heart disease in his father and mother.   Allergies No Known Allergies   Home Medications  Prior to Admission medications   Medication Sig Start Date End Date Taking? Authorizing Provider  albuterol (PROAIR HFA) 108 (90 Base) MCG/ACT inhaler Inhale 1-2 puffs into the lungs every 4 (four) hours as needed for wheezing or shortness of breath.    [provider]  albuterol (PROVENTIL) (2.5 MG/3ML) 0.083% nebulizer solution Take 3 mLs (2.5 mg total) by nebulization every 6 (six) hours as needed for wheezing or shortness of breath. 11/10/19   Erick Blinks, MD  albuterol (PROVENTIL) (2.5 MG/3ML) 0.083% nebulizer solution Take 2.5 mg by nebulization every 6 (six) hours as needed for wheezing or shortness of breath. 05/29/20   [provider]  albuterol (VENTOLIN HFA) 108 (90 Base) MCG/ACT inhaler Inhale 1-2 puffs into the lungs every 4 (four) hours as needed for wheezing or shortness of breath. 05/31/20   Nyoka Cowden, MD  aspirin EC 81 MG tablet Take 1 tablet (81 mg total) by mouth daily. Swallow whole. 01/27/20   Jonelle Sidle, MD  aspirin EC 81 MG tablet Take 81 mg by mouth daily. Swallow whole.    [provider]  budesonide-formoterol (SYMBICORT) 160-4.5 MCG/ACT inhaler Take 2 puffs first thing in am and then another 2 puffs about 12 hours later. 01/24/20   Nyoka Cowden, MD  budesonide-formoterol (SYMBICORT) 160-4.5 MCG/ACT inhaler Inhale 2 puffs into the lungs 2 (two) times daily.    [provider]  buPROPion (WELLBUTRIN XL) 300 MG 24 hr tablet Take 1 tablet (300 mg total) by mouth daily. 03/06/20   Freddy Finner, NP  buPROPion (WELLBUTRIN XL) 300 MG 24 hr tablet Take 300 mg by mouth daily. 03/06/20   [provider]  busPIRone (BUSPAR) 10 MG tablet TAKE  ONE TABLET BY MOUTH 3 TIMES A DAY 07/30/20   Freddy Finner, NP  busPIRone (BUSPAR) 10 MG tablet Take 10 mg by mouth 3 (three) times daily. 07/30/20   [provider]  metoprolol succinate (TOPROL XL) 25 MG 24 hr tablet Take 0.5 tablets (12.5 mg total) by mouth daily. 07/13/20   Jonelle Sidle, MD  metoprolol succinate (TOPROL-XL) 25 MG 24 hr tablet Take 12.5 mg by mouth daily. 07/13/20   [provider]  montelukast (SINGULAIR) 10 MG tablet TAKE ONE TABLET BY MOUTH AT BEDTIME 06/01/20   Freddy Finner, NP  montelukast (SINGULAIR) 10  MG tablet Take 10 mg by mouth at bedtime. 06/01/20   [provider]  nitroGLYCERIN (NITROSTAT) 0.4 MG SL tablet Place 1 tablet (0.4 mg total) under the tongue every 5 (five) minutes as needed for chest pain. 07/13/20 10/11/20  Jonelle Sidle, MD  nitroGLYCERIN (NITROSTAT) 0.4 MG SL tablet Place 0.4 mg under the tongue every 5 (five) minutes as needed for chest pain. 07/13/20   [provider]  OXYGEN Inhale 2 L into the lungs as needed (shortness of breath).    [provider]  tamsulosin (FLOMAX) 0.4 MG CAPS capsule Take 1 capsule (0.4 mg total) by mouth daily. 05/31/20   Freddy Finner, NP  tamsulosin (FLOMAX) 0.4 MG CAPS capsule Take 0.4 mg by mouth daily. 05/31/20   [provider]  terazosin (HYTRIN) 2 MG capsule TAKE ONE CAPSULE BY MOUTH ONCE DAILY 07/02/20   Kerri Perches, MD  terazosin (HYTRIN) 2 MG capsule Take 2 mg by mouth daily. 07/02/20   [provider]     Critical care time: NA    Simonne Martinet ACNP-BC [facility] Pulmonary/Critical Care Pager # [phone] OR # [phone] if no answer

## 2020-10-20 LAB — GLUCOSE, CAPILLARY: Glucose-Capillary: 164 mg/dL — ABNORMAL HIGH (ref 70–99)

## 2020-10-20 MED ORDER — LIDOCAINE 5 % EX PTCH
1.0000 | MEDICATED_PATCH | CUTANEOUS | Status: DC
  Administered 2020-10-20 – 2020-10-30 (×11): 1 via TRANSDERMAL
  Filled 2020-10-20 (×10): qty 1

## 2020-10-20 NOTE — Progress Notes (Signed)
PROGRESS NOTE   Subjective/Complaints: Refuses spinal brace; educated regarding importance Complains of bilateral nipple pain: lanolin ordered to decrease chafing from brace C/o right shoulder pain and limited ROM: lidocaine patch ordered  ROS: Patient denies fever, rash, sore throat, blurred vision, nausea, vomiting, diarrhea, cough,   chest pain,  headache, or mood change.    Objective:   No results found. Recent Labs    10/19/20 0618  WBC 9.7  HGB 12.0*  HCT 39.2  PLT 345   No results for input(s): NA, K, CL, CO2, GLUCOSE, BUN, CREATININE, CALCIUM in the last 72 hours.  Intake/Output Summary (Last 24 hours) at 10/20/2020 1723 Last data filed at 10/20/2020 1300 Gross per 24 hour  Intake 1280 ml  Output 2150 ml  Net -870 ml        Physical Exam: Vital Signs Blood pressure 108/79, pulse 73, temperature 97.9 F (36.6 C), temperature source Oral, resp. rate 17, height 5\' 8"  (1.727 m), weight 81.9 kg, SpO2 98 %. Gen: no distress, normal appearing HEENT: oral mucosa pink and moist, NCAT Cardio: Reg rate Chest: normal effort, normal rate of breathing GI/Abdomen: BS +, non-tender, non-distended. PEG intact with sl drainage Ext: no clubbing, cyanosis, or edema Psych: flat but cooperative Skin: scattered abrasions,  Neurologic: Cranial nerves II through XII intact, motor strength is 3- Right and 4 /5 Left  deltoid, 4/5 bilateral bicep, tricep, grip, hip flexor, knee extensors, ankle dorsiflexor and plantar flexor Sensory exam normal sensation to light touch and proprioception in bilateral upper  extremities  Musculoskeletal: some pain with PROM RIght shoulder abd/flex    Assessment/Plan: 1. Functional deficits which require 3+ hours per day of interdisciplinary therapy in a comprehensive inpatient rehab setting.  Physiatrist is providing close team supervision and 24 hour management of active medical problems listed  below.  Physiatrist and rehab team continue to assess barriers to discharge/monitor patient progress toward functional and medical goals  Care Tool:  Bathing    Body parts bathed by patient: Right arm,Left arm,Chest,Abdomen,Front perineal area,Buttocks,Right upper leg,Left upper leg,Face   Body parts bathed by helper: Left lower leg,Right lower leg     Bathing assist Assist Level: Moderate Assistance - Patient 50 - 74%     Upper Body Dressing/Undressing Upper body dressing   What is the patient wearing?: Orthosis (TLSO)    Upper body assist Assist Level: Total Assistance - Patient < 25%    Lower Body Dressing/Undressing Lower body dressing      What is the patient wearing?: Hospital gown only     Lower body assist       Toileting Toileting    Toileting assist Assist for toileting: Minimal Assistance - Patient > 75%     Transfers Chair/bed transfer  Transfers assist     Chair/bed transfer assist level: Contact Guard/Touching assist     Locomotion Ambulation   Ambulation assist      Assist level: Contact Guard/Touching assist Assistive device: Walker-rolling Max distance: 100   Walk 10 feet activity   Assist     Assist level: Contact Guard/Touching assist Assistive device: Walker-rolling   Walk 50 feet activity   Assist    Assist  level: Contact Guard/Touching assist Assistive device: Walker-rolling    Walk 150 feet activity   Assist Walk 150 feet activity did not occur: Safety/medical concerns         Walk 10 feet on uneven surface  activity   Assist Walk 10 feet on uneven surfaces activity did not occur: Safety/medical concerns         Wheelchair     Assist Will patient use wheelchair at discharge?: No             Wheelchair 50 feet with 2 turns activity    Assist            Wheelchair 150 feet activity     Assist          Blood pressure 108/79, pulse 73, temperature 97.9 F (36.6 C),  temperature source Oral, resp. rate 17, height 5\' 8"  (1.727 m), weight 81.9 kg, SpO2 98 %.    Medical Problem List and Plan: 1.Debilitysecondary to fall with 3 column T8 vertebral body fracture/T7 neural arch fracture/right TVP T4-8/right C7 transverse process fracture through the foramen. CTO to be applied in supine position -patient may shower with CTO -ELOS/Goals:10/31/20- supervision to min A            -Continue CIR therapies including PT, OT, and SLP  2. Impaired mobility -DVT/anticoagulation:Continue Lovenox.               -vascular study negative -antiplatelet therapy: Aspirin 81 mg daily 3. Pain Management:Oxycodone as needed             -?mild neuropathic pain RUE. ?C7 radic---continue gabapentin 100mg  tid   -could be shoulder, RTC also   -continue with support, ROM for now. 4. Mood:Wellbutrin 300 mg daily, BuSpar 10 mg 3 times daily, Klonopin 0.5 mg nightly, -antipsychotic agents: Seroquel 150 mg twice daily and 300 mg nightly             -behavior has been appropriate 5. Neuropsych: This patientis notcapable of making decisions on hisown behalf. 6. Skin/Wound Care:Routine skin checks 7. Fluids/Electrolytes/Nutrition:               -recent lab work is within normal limits.             -good po intake 8. Acute blood loss anemia. Follow-up CBC 9. Multiple bilateral rib fractures. Conservative care 10. 3: T8 vertebral body fracture. Follow-up neurosurgery Dr. 11/02/20. Nonoperative.              -CTO for HOB>30 degrees at this point-  ~7wks post fall  11. T7 neural arch fracture. Follow-up neurosurgery 12. Right TVP T4-8. Pain control follow neurosurgery 13. Right C7 TVP fracture through foramen. CTA negative. CTO 14. Large scalp laceration right eyebrow laceration. Repaired 08/17/2020. Sutures removed 15. Hypertension. Lopressor 25 mg twice daily 16. CAD/MI/DES. Continue aspirin 17. Acute  hypercarbic ventilatory dependent respiratory failure/COPD. Tracheostomy 09/03/2020 per Dr. 10/17/2020. Changed back to #6 cuffless 10/05/2020 due to secretions. Patient will follow up long-term with Dr. Janee Morn           CBC CXR unremarkable   -5/31 back to 5L oxygen. Wbc's down yesterday. occ low grade temp, resp cx pend   -continue augmentin for now   -continue trach mgt, breathing treatments etc  6/2-3 breathing seems generally improved, less work of breathing   -trach gs/cx with WBC's rare stenotrophomonas   -continue prednisone 10mg  bid and augmenting   -continue above nebs, inhalers, O2   - PCCM to see  today re: trachCOPD, ability to wean from trach.     18. Dysphagia. Gastrostomy tube 09/03/2020 per Dr. Janee Morn.              -Tolerating Dysphagia #3 thin liquids--advance per SLP 19. BPH. Continue Hytrin/Flomax/Urecholine---really on near max doses              -Large bladder volumes             -in/out caths q4-6 hours---still not voiding.             -oob to void for all attempts   6/3 increase flomax to bid 20. History of alcohol tobacco abuse. Provide counseling 21. Constipation---+bm 5/30 22. Left shoulder pain: lidocaine patch ordered 23. Bilateral nipple pain: lanolin ordered prn to decrease chafing from brace.     LOS: 11 days A FACE TO FACE EVALUATION WAS PERFORMED  Drema Pry Derk Doubek 10/20/2020, 5:23 PM

## 2020-10-20 NOTE — Progress Notes (Signed)
Physical Therapy Session Note  Patient Details  Name: Charles Weeks MRN: [id number] Date of Birth: 1965-12-11  Today's Date: 10/20/2020 PT Individual Time: 5521-7471 PT Individual Time Calculation (min): 45 min   Short Term Goals: Week 1:  PT Short Term Goal 1 (Week 1): Pt will perform sit to stand with CGA PT Short Term Goal 1 - Progress (Week 1): Met PT Short Term Goal 2 (Week 1): Pt will perform bed to chair transfer with CGA. PT Short Term Goal 2 - Progress (Week 1): Met PT Short Term Goal 3 (Week 1): Pt will ambulate 100' with CGA and LRAD. PT Short Term Goal 3 - Progress (Week 1): Progressing toward goal Week 2:  PT Short Term Goal 1 (Week 2): STGs = LTGs  Skilled Therapeutic Interventions/Progress Updates:    pt received in bed and initially refusing therapy reporting feeling upset that he was woken up at 4 am. Pt then reports he was upset he was required to wear back brace when HOB over 30 degrees for meals. PT educated pt on current medical orders for this for safety and however was still upset. With encouragement, pt agreeable to transfer to EOB at supervision with log rolling and back brace donned total A. Pt then sat EOB for several minutes reporting he needed "a minute". Then agreeable to transfer OOB to recliner, no AD supervision good awareness of trach tubing and telemetry monitor. Pt then directed in 2x20 marching, LAQ, hip abduction and hip adduction, bicep curls, front raises to shoulder height only, breathing technique with all activity for improved O2 uptake with good effect, O2 never below 89% via trach. Pt did require rest breaks 2/2 fatigue. Pt declined to participate in gait training x2 during session. Pt left in recliner, All needs in reach and in good condition. Call light in hand.  And alarm set. Nursing aware.   Therapy Documentation Precautions:  Precautions Precautions: Back Precaution Booklet Issued: Yes (comment) Precaution Comments: trach collar, TLSO don  with rolling if HOB >30,  abdominal binder to protect PEG, c-collar d/c'ed 5/24 Required Braces or Orthoses: Spinal Brace Cervical Brace: Hard collar,At all times Spinal Brace: Thoracolumbosacral orthotic,Applied in supine position Restrictions Weight Bearing Restrictions: No General: PT Amount of Missed Time (min): 15 Minutes PT Missed Treatment Reason: Patient fatigue;Patient unwilling to participate Vital Signs: Oxygen Therapy SpO2: 93 % O2 Device: Tracheostomy Collar O2 Flow Rate (L/min): 5 L/min FiO2 (%): 28 % Pain: Pain Assessment Pain Scale: 0-10 Pain Score: 5  Pain Location: Shoulder Pain Orientation: Right Pain Radiating Towards: back Pain Descriptors / Indicators: Discomfort Pain Frequency: Constant Pain Intervention(s): Medication (See eMAR) Mobility:   Locomotion :    Trunk/Postural Assessment :    Balance:   Exercises:   Other Treatments:      Therapy/Group: Individual Therapy  Junie Panning 10/20/2020, 12:11 PM

## 2020-10-20 NOTE — Progress Notes (Signed)
Occupational Therapy Session Note  Patient Details  Name: Charles Weeks MRN: [id number] Date of Birth: Oct 05, 1965  Today's Date: 10/20/2020 OT Individual Time: 1107-1200 OT Individual Time Calculation (min): 53 min    Short Term Goals: Week 2:  OT Short Term Goal 1 (Week 2): Pt will instruct caregiver on donning TLSO with min cueing OT Short Term Goal 2 (Week 2): Patient will tolerate standing for 5 minutes in prep for BADL task OT Short Term Goal 3 (Week 2): Pt will use AE for LB dressing with min questioning cues  Skilled Therapeutic Interventions/Progress Updates:    Pt received seated in recliner, c/o B nipple soreness and R shoulder/back pain but agreeable to therapy. MD in/out for assessment and made aware. Pt declining ADL this session and req to go outside. Stand-pivot to and from recliner to w/c x2 with no AD and CGA. Transported to hospital [facility] 2/2 time management and energy conservation. Session focus on activity tolerance + BUE/BLE strength in prep for improved ADL/func mobility performance. Completed 2x10 BLE seated marches, seated kicks, chest press, and forward rows with 3 lb dowel rod. Req rest break in between sets, but satO2 above 95% throughout session on 5L at rest and 6 L with activity via trach collar at 34fO2. Additionally, discussed pt's leisure interests including fishing + gardening. Reviewed adapted gardening techniques and products to facilitate continued engagement in leisure interests.  Pt left in recliner with chair alarm engaged, call bell in reach, and all immediate needs met.   Therapy Documentation Precautions:  Precautions Precautions: Back Precaution Booklet Issued: Yes (comment) Precaution Comments: trach collar, TLSO don with rolling if HOB >30,  abdominal binder to protect PEG, c-collar d/c'ed 5/24 Required Braces or Orthoses: Spinal Brace Cervical Brace: Hard collar,At all times Spinal Brace: Thoracolumbosacral orthotic,Applied in supine  position Restrictions Weight Bearing Restrictions: No Pain: see session note   ADL: See Care Tool for more details.   Therapy/Group: Individual Therapy  RVolanda NapoleonMS, OTR/L  10/20/2020, 7:03 AM

## 2020-10-21 LAB — GLUCOSE, CAPILLARY: Glucose-Capillary: 156 mg/dL — ABNORMAL HIGH (ref 70–99)

## 2020-10-21 NOTE — Progress Notes (Addendum)
Physical Therapy Session Note  Patient Details  Name: Charles Weeks MRN: [id number] Date of Birth: [birth year]  Today's Date: 10/21/2020 PT Individual Time: 4132-4401 and 1405-1420 PT Individual Time Calculation (min): 55 min and 15 min  Short Term Goals: Week 2:  PT Short Term Goal 1 (Week 2): STGs = LTGs  Skilled Therapeutic Interventions/Progress Updates:     Session 1: Patient in bed upon PT arrival. Patient alert and agreeable to PT session. Patient reported 7-8/10 back and R shoulder pain during session, reports he received pain medicine just prior to session. PT provided repositioning, rest breaks, and distraction as pain interventions throughout session.   Vitals: 5L/min O2 via Trach collar with PMSV donned throughout session, SPO2 85-100%, desaturation in standing activities, recovered <2 min in sitting with cues for pursed lip breathing, HR 70s at rest and 90s with activity.  Therapeutic Activity: Bed Mobility: Patient performed rolling R/L with supervision with use of bed rail. Donned TLSO wit total A bed level and adjusted brace for propeer placement. He performed supine to sit with CGA with in a flat bed use of bed rail. Provided verbal cues for log roll technique and minimizing use of bed rail to simulate home set up, intrusted to use bottom elbow to push up instead, continued to use top arm on bed rail. Transfers: Patient performed sit to/from stand x4 with close supervision. Provided verbal cues for sending hips back to sit for safety due to sitting very close to EOB.  Neuromuscular Re-ed: Patient performed the following standing balance activities for increased dynamic balance strategies and activity tolerance with a salient activity: -standing x2 min with supervision for balance for increased standing tolerance -swaying/dancing holding on to therapist 3x2-3 min to music selection of patient's choice   Manual Therapy: Initiated manual therapy for patient's R shoulder for  pain management, improved pain free ROM, and to establish therapeutic alliance at beginning of session R Shoulder AROM grossly 70 deg flexion limited by pain and 45 deg abduction -R shoulder flexion and abduction PROM with scapular upward rotation and 3x10 sec hold at end or pain free range, progressed to ~100 deg flexion and just under 90 degrees abduction -grade 1-2 A/P joint mobilizations to [facility] joint for pain management, 2x45 sec -grade 3-4 distraction with inferior mobilization to [facility] joint for increased joint mobility with inferior glide 3x45 sec  Patient sitting EOB at end of session with breaks locked, bed alarm set, and all needs within reach. NT made aware of patient's position at end of session. NT in agreement and reports that patient is safe to sit EOB.   Session 2: Patient sitting EOB with TLSO donned with LPN and charge nurse performing suctioning via trach with patient with increased work of breathing upon PT arrival. Patient missed 15 min of skilled PT due to nursing care to remove mucus plug from patient's trach, RN aware. Will attempt to make-up missed time as able.  Patient sitting EOB with continued increase work of breathing from earlier session, but better than when charge nurse was in. SPO2 85-92% with poor waveform on continuous pulse-ox. Replaced finger sensor for improved reading, LPN made aware. Improved reading with SO2 90-96% on 5L/min via trach collar, FiO2 28%.   Cued patient through diaphragmatic breathing exercises with use of brace with hand over top to provide resistance to the diaphragm. Patient declined lying down at this time, reports sitting EOB since this morning. Educated on risk for increased back pain and flexion when sitting unsupported  for increased durations. Suggested sitting in the recliner for improved back support in sitting. Patient in agreement and transferred over to recliner with CGA and increased work of breathing after, SPO2 90%. Cued in diaphragmatic  breathing exercise again with improved work of breathing.   Patient in recliner in the room, O2 settings as above, SPO2 94%, at end of session with breaks locked, chair alarm set, and all needs within reach.   Therapy Documentation Precautions:  Precautions Precautions: Back Precaution Booklet Issued: Yes (comment) Precaution Comments: trach collar, TLSO don with rolling if HOB >30,  abdominal binder to protect PEG, c-collar d/c'ed 5/24 Required Braces or Orthoses: Spinal Brace Cervical Brace: Hard collar,At all times Spinal Brace: Thoracolumbosacral orthotic,Applied in supine position Restrictions Weight Bearing Restrictions: No General: PT Amount of Missed Time (min): 15 Minutes PT Missed Treatment Reason: Nursing care Agitated Behavior Scale: TBI Observation Details Observation Environment: Patient's room Start of observation period - Date: 10/21/20 Start of observation period - Time: 0900 End of observation period - Date: 10/21/20 End of observation period - Time: 0955 Agitated Behavior Scale (DO NOT LEAVE BLANKS) Short attention span, easy distractibility, inability to concentrate: Absent Impulsive, impatient, low tolerance for pain or frustration: Absent Uncooperative, resistant to care, demanding: Absent Violent and/or threatening violence toward people or property: Absent Explosive and/or unpredictable anger: Absent Rocking, rubbing, moaning, or other self-stimulating behavior: Absent Pulling at tubes, restraints, etc.: Absent Wandering from treatment areas: Absent Restlessness, pacing, excessive movement: Absent Repetitive behaviors, motor, and/or verbal: Absent Rapid, loud, or excessive talking: Absent Sudden changes of mood: Absent Easily initiated or excessive crying and/or laughter: Absent Self-abusiveness, physical and/or verbal: Absent Agitated behavior scale total score: 14 Agitated Behavior Scale: TBI Observation Details Observation Environment:  Patient's room Start of observation period - Date: 10/21/20 Start of observation period - Time: 1405 End of observation period - Date: 10/21/20 End of observation period - Time: 1420 Agitated Behavior Scale (DO NOT LEAVE BLANKS) Short attention span, easy distractibility, inability to concentrate: Absent Impulsive, impatient, low tolerance for pain or frustration: Absent Uncooperative, resistant to care, demanding: Absent Violent and/or threatening violence toward people or property: Absent Explosive and/or unpredictable anger: Absent Rocking, rubbing, moaning, or other self-stimulating behavior: Absent Pulling at tubes, restraints, etc.: Absent Wandering from treatment areas: Absent Restlessness, pacing, excessive movement: Absent Repetitive behaviors, motor, and/or verbal: Absent Rapid, loud, or excessive talking: Absent Sudden changes of mood: Absent Easily initiated or excessive crying and/or laughter: Absent Self-abusiveness, physical and/or verbal: Absent Agitated behavior scale total score: 14   Therapy/Group: Individual Therapy  Nathanyl Andujo L Patric Buckhalter PT, DPT  10/21/2020, 4:41 PM

## 2020-10-21 NOTE — Progress Notes (Signed)
PROGRESS NOTE   Subjective/Complaints: No complaints this morning Patient's chart reviewed- No issues reported overnight Vitals signs stable +cough  ROS: Patient denies fever, rash, sore throat, blurred vision, nausea, vomiting, diarrhea, chest pain,  headache, or mood change. +cough   Objective:   No results found. Recent Labs    10/19/20 0618  WBC 9.7  HGB 12.0*  HCT 39.2  PLT 345   No results for input(s): NA, K, CL, CO2, GLUCOSE, BUN, CREATININE, CALCIUM in the last 72 hours.  Intake/Output Summary (Last 24 hours) at 10/21/2020 1358 Last data filed at 10/21/2020 1021 Gross per 24 hour  Intake 1157 ml  Output 2134 ml  Net -977 ml        Physical Exam: Vital Signs Blood pressure 121/84, pulse 72, temperature 98.5 F (36.9 C), resp. rate 17, height 5\' 8"  (1.727 m), weight 81.9 kg, SpO2 95 %. Gen: no distress, normal appearing HEENT: oral mucosa pink and moist, NCAT Cardio: Reg rate Chest: normal effort, normal rate of breathing GI/Abdomen: BS +, non-tender, non-distended. PEG intact with sl drainage Ext: no clubbing, cyanosis, or edema Psych: flat but cooperative Skin: scattered abrasions,  Neurologic: Cranial nerves II through XII intact, motor strength is 3- Right and 4 /5 Left  deltoid, 4/5 bilateral bicep, tricep, grip, hip flexor, knee extensors, ankle dorsiflexor and plantar flexor Sensory exam normal sensation to light touch and proprioception in bilateral upper  extremities  Musculoskeletal: some pain with PROM RIght shoulder abd/flex    Assessment/Plan: 1. Functional deficits which require 3+ hours per day of interdisciplinary therapy in a comprehensive inpatient rehab setting.  Physiatrist is providing close team supervision and 24 hour management of active medical problems listed below.  Physiatrist and rehab team continue to assess barriers to discharge/monitor patient progress toward  functional and medical goals  Care Tool:  Bathing    Body parts bathed by patient: Right arm,Left arm,Chest,Abdomen,Front perineal area,Buttocks,Right upper leg,Left upper leg,Face   Body parts bathed by helper: Left lower leg,Right lower leg     Bathing assist Assist Level: Moderate Assistance - Patient 50 - 74%     Upper Body Dressing/Undressing Upper body dressing   What is the patient wearing?: Orthosis (TLSO)    Upper body assist Assist Level: Total Assistance - Patient < 25%    Lower Body Dressing/Undressing Lower body dressing      What is the patient wearing?: Hospital gown only     Lower body assist       Toileting Toileting    Toileting assist Assist for toileting: Minimal Assistance - Patient > 75%     Transfers Chair/bed transfer  Transfers assist     Chair/bed transfer assist level: Contact Guard/Touching assist     Locomotion Ambulation   Ambulation assist      Assist level: Contact Guard/Touching assist Assistive device: Walker-rolling Max distance: 100   Walk 10 feet activity   Assist     Assist level: Contact Guard/Touching assist Assistive device: Walker-rolling   Walk 50 feet activity   Assist    Assist level: Contact Guard/Touching assist Assistive device: Walker-rolling    Walk 150 feet activity   Assist Walk 150  feet activity did not occur: Safety/medical concerns         Walk 10 feet on uneven surface  activity   Assist Walk 10 feet on uneven surfaces activity did not occur: Safety/medical concerns         Wheelchair     Assist Will patient use wheelchair at discharge?: No             Wheelchair 50 feet with 2 turns activity    Assist            Wheelchair 150 feet activity     Assist          Blood pressure 121/84, pulse 72, temperature 98.5 F (36.9 C), resp. rate 17, height 5\' 8"  (1.727 m), weight 81.9 kg, SpO2 95 %.    Medical Problem List and  Plan: 1.Debilitysecondary to fall with 3 column T8 vertebral body fracture/T7 neural arch fracture/right TVP T4-8/right C7 transverse process fracture through the foramen. CTO to be applied in supine position -patient may shower with CTO -ELOS/Goals:10/31/20- supervision to min A            -Continue CIR therapies including PT, OT, and SLP  2. Impaired mobility -DVT/anticoagulation:Continue Lovenox.               -vascular study negative -antiplatelet therapy: Aspirin 81 mg daily 3. Pain Management:Continue Oxycodone as needed             -?mild neuropathic pain RUE. ?C7 radic---continue gabapentin 100mg  tid   -could be shoulder, RTC also   -continue with support, ROM for now. 4. Mood:Continue Wellbutrin 300 mg daily, BuSpar 10 mg 3 times daily, Klonopin 0.5 mg nightly, -antipsychotic agents: Seroquel 150 mg twice daily and 300 mg nightly             -behavior has been appropriate 5. Neuropsych: This patientis notcapable of making decisions on hisown behalf. 6. Skin/Wound Care:Routine skin checks 7. Fluids/Electrolytes/Nutrition:               -recent lab work is within normal limits.             -good po intake 8. Acute blood loss anemia. Follow-up CBC 9. Multiple bilateral rib fractures. Conservative care 10. 3: T8 vertebral body fracture. Follow-up neurosurgery Dr. 11/02/20. Nonoperative.              -CTO for HOB>30 degrees at this point-  ~7wks post fall  11. T7 neural arch fracture. Follow-up neurosurgery 12. Right TVP T4-8. Pain control follow neurosurgery 13. Right C7 TVP fracture through foramen. CTA negative. CTO 14. Large scalp laceration right eyebrow laceration. Repaired 08/17/2020. Sutures removed 15. Hypertension. Lopressor 25 mg twice daily 16. CAD/MI/DES. Continue aspirin 17. Acute hypercarbic ventilatory dependent respiratory failure/COPD. Tracheostomy 09/03/2020 per Dr. 10/17/2020. Changed  back to #6 cuffless 10/05/2020 due to secretions. Patient will follow up long-term with Dr. Janee Morn           CBC CXR unremarkable   -5/31 back to 5L oxygen. Wbc's down yesterday. occ low grade temp, resp cx pend   -continue augmentin for now   -continue trach mgt, breathing treatments etc  6/2-3 breathing seems generally improved, less work of breathing   -trach gs/cx with WBC's rare stenotrophomonas   -continue prednisone 10mg  bid and augmenting   -continue above nebs, inhalers, O2   - PCCM to see today re: trachCOPD, ability to wean from trach.     18. Dysphagia. Gastrostomy tube 09/03/2020 per Dr. 04-09-1983.              -  Tolerating Dysphagia #3 thin liquids--advance per SLP 19. BPH. Continue Hytrin/Flomax/Urecholine---really on near max doses              -Large bladder volumes             -in/out caths q4-6 hours---still not voiding.             -oob to void for all attempts   6/3 increase flomax to bid 20. History of alcohol tobacco abuse. Provide counseling 21. Constipation---+bm 5/30 22. Left shoulder pain: lidocaine patch ordered 23. Bilateral nipple pain: lanolin ordered prn to decrease chafing from brace.     LOS: 12 days A FACE TO FACE EVALUATION WAS PERFORMED  Clint Bolder P Riki Gehring 10/21/2020, 1:58 PM

## 2020-10-22 LAB — GLUCOSE, CAPILLARY: Glucose-Capillary: 142 mg/dL — ABNORMAL HIGH (ref 70–99)

## 2020-10-22 MED ORDER — PREDNISONE 5 MG PO TABS
5.0000 mg | ORAL_TABLET | Freq: Two times a day (BID) | ORAL | Status: DC
  Administered 2020-10-22 – 2020-10-26 (×8): 5 mg via ORAL
  Filled 2020-10-22 (×8): qty 1

## 2020-10-22 NOTE — Progress Notes (Signed)
Occupational Therapy Session Note  Patient Details  Name: Charles Weeks MRN: [id number] Date of Birth: [birth year]  Today's Date: 10/22/2020 OT Individual Time: 1120-1200 OT Individual Time Calculation (min): 40 min  and Today's Date: 10/22/2020 OT Missed Time: 20 Minutes Missed Time Reason: Nursing care (Patient getting I&O cathed)   Short Term Goals: Week 2:  OT Short Term Goal 1 (Week 2): Pt will instruct caregiver on donning TLSO with min cueing OT Short Term Goal 2 (Week 2): Patient will tolerate standing for 5 minutes in prep for BADL task OT Short Term Goal 3 (Week 2): Pt will use AE for LB dressing with min questioning cues  Skilled Therapeutic Interventions/Progress Updates:    Pt greeted supine in bed with nursing bladder scanning patient. Patient with high residuals and needs to be cathed. Pt missed 20 minutes of OT treatment session 2/2 nursing care. Pt agreeable to OT treatment session but declined any BADL tasks. Pt completed log roll and sidelying to sit with supervision. Stand-pivot over to recliner with close supervision. Respiratory therapy entered and placed cap in trach. Pt completed 5 sit<>stands and seated hip extension x10 with trach capped on RA. Pt desat to 85% with activity. Respiratory therapy suggested 2L of O2 via [place]. Pt maintained on 2L via [place] with trach capped for the rest of session. SpO2 stayed at 92% and above with activity and was at 98% at rest. There-ex continued from recliner with 3 sets of 10 hip adduction/abduction using pillow and orange theraband. Seated hip extension, knee extension, and mini squats. Pt left seated in recliner with chair alarm on, call bell in reach, needs met, SpO2 at 97% on 2L with trach capped.   Therapy Documentation Precautions:  Precautions Precautions: Back Precaution Booklet Issued: Yes (comment) Precaution Comments: trach collar, TLSO don with rolling if HOB >30,  abdominal binder to protect PEG, c-collar d/c'ed 5/24 Required  Braces or Orthoses: Spinal Brace Cervical Brace: Hard collar,At all times Spinal Brace: Thoracolumbosacral orthotic,Applied in supine position Restrictions Weight Bearing Restrictions: No General: General OT Amount of Missed Time: 20 Minutes Pain: Pain Assessment Pain Scale: 0-10 Pain Score: 5  Pain Type: Acute pain Pain Location: Back Pain Orientation: Right Pain Descriptors / Indicators: Aching;Discomfort Pain Intervention(s): Repositioned   Therapy/Group: Individual Therapy  Valma Cava 10/22/2020, 12:23 PM

## 2020-10-22 NOTE — Progress Notes (Signed)
Speech Language Pathology TBI Note  Patient Details  Name: Charles Weeks MRN: [id number] Date of Birth: [birth year]  Today's Date: 10/22/2020 SLP Individual Time: 1210-1240 SLP Individual Time Calculation (min): 30 min  Short Term Goals: Week 2: SLP Short Term Goal 1 (Week 2): Patient will consume current diet with minimal overt s/s of aspiration and overall Mod I for use of swallowing compensatory strategies. SLP Short Term Goal 2 (Week 2): Patient will demonstrate efficient mastiction and complete oral clearance with trials of regular textures over 2 sessions prior to upgrade with supervision level verbal cues. SLP Short Term Goal 3 (Week 2): Patient will utilize speech intelliigibility strategies at the sentence level to achieve ~90% intelligibility with supervision verbal cues. SLP Short Term Goal 4 (Week 2): Patient will recall functional information with supervision level verbal cues for use of strategies.  Skilled Therapeutic Interventions:   Skilled treatment performed with focus on swallowing and communication goals. Patient was awake and sitting in upright position in recliner. Janina Mayo was initially capped on arrival. Respiratory therapy replaced cap with PMSV shortly upon arrival per change in MD orders. All vitals remained [facility] throughout session. SLP assessed tolerance to Dys 3 diet and thin liquids during noon meal, including chicken with gravy, mashed potatoes, dinner roll, and thin liquids via straw. Patient exhibited effective mastication and oral prep among all consistencies, timely swallow initiation, and without signs or symptoms of aspiration. Very mild oral residue which was effectively cleared with liquid rinse. Patient was known to take rather large bites requiring verbal cueing to reduce bolus volumes where patient was able to demonstrate  ability to better self monitor bite sizes with subsequent bites. Recommend continuation of current diet at this time, especially considering  patient's current respiratory status and the possibility for increased SOB during meals. Patient verbalized agreement. Reviewed safe swallow precautions including slow rate of consumption and small bites/sips. Patient exhibited decreased breath support on this date and was perceived as ~75-85% intelligible at phrase and sentence level. He required supervision-to- Min A verbal cues for recall of speech intelligibility strategies including diaphragmatic breathing to enhance breath support and vocal intensity. Patient was eft in recliner chair with alarm activated and all needs within reach. Continue with current plan of care.   Pain Pain Assessment Pain Scale: 0-10 Pain Score: 0-No pain Faces Pain Scale: No hurt Pain Type: Acute pain Pain Location: Back Pain Orientation: Right Pain Descriptors / Indicators: Aching;Discomfort Pain Intervention(s): Medication (See eMAR)  Agitated Behavior Scale: TBI Observation Details Observation Environment: Patient room Start of observation period - Date: 10/22/20 Start of observation period - Time: 1210 End of observation period - Date: 10/22/20 End of observation period - Time: 1240 Agitated Behavior Scale (DO NOT LEAVE BLANKS) Short attention span, easy distractibility, inability to concentrate: Present to a slight degree Impulsive, impatient, low tolerance for pain or frustration: Absent Uncooperative, resistant to care, demanding: Absent Violent and/or threatening violence toward people or property: Absent Explosive and/or unpredictable anger: Absent Rocking, rubbing, moaning, or other self-stimulating behavior: Absent Pulling at tubes, restraints, etc.: Absent Wandering from treatment areas: Absent Restlessness, pacing, excessive movement: Absent Repetitive behaviors, motor, and/or verbal: Absent Rapid, loud, or excessive talking: Absent Sudden changes of mood: Absent Easily initiated or excessive crying and/or laughter:  Absent Self-abusiveness, physical and/or verbal: Absent Agitated behavior scale total score: 15  Therapy/Group: Individual Therapy  Tamala Ser 10/22/2020, 12:49 PM

## 2020-10-22 NOTE — Progress Notes (Signed)
Occupational Therapy Session Note  Patient Details  Name: Charles Weeks MRN: [id number] Date of Birth: 12-15-1965  Today's Date: 10/22/2020 OT Individual Time: 1300-1400 OT Individual Time Calculation (min): 60 min    Short Term Goals: Week 2:  OT Short Term Goal 1 (Week 2): Pt will instruct caregiver on donning TLSO with min cueing OT Short Term Goal 2 (Week 2): Patient will tolerate standing for 5 minutes in prep for BADL task OT Short Term Goal 3 (Week 2): Pt will use AE for LB dressing with min questioning cues  Skilled Therapeutic Interventions/Progress Updates:    Pt greeted seated in recliner and agreeable to OT treatment session. Pt back on PMV. Pt transferred to 5L via trach on portable O2 tank. Stand-pivot to  wc with close supervision. Pt brought to therapy gym and worked on [employer] strength/endurance with 5 mins x2 on [employer] arm bike. Standing balance/endurance with standing BITS activity reaching in all 4 quadrants for 3, 3 minute intervals. SpO2 at 90% with activity on 5L via trach. Extended rest breaks in between 3 minute standing sets. Memory activity using BITs. Had BITS verbalize words 2/2 limited literacy. Pt able to get up to 4 words in a row before missing. Pt returned to room and left seated in recliner with chair alarm on, call bell in reach, and needs met.   Therapy Documentation Precautions:  Precautions Precautions: Back Precaution Booklet Issued: Yes (comment) Precaution Comments: trach collar, TLSO don with rolling if HOB >30,  abdominal binder to protect PEG, c-collar d/c'ed 5/24 Required Braces or Orthoses: Spinal Brace Cervical Brace: Hard collar,At all times Spinal Brace: Thoracolumbosacral orthotic,Applied in supine position Restrictions Weight Bearing Restrictions: No Pain: Pain Assessment Pain Scale: 0-10 Pain Score: 0-No pain Faces Pain Scale: No hurt Pain Type: Acute pain Pain Location: Back Pain Orientation: Right Pain Descriptors / Indicators:  Aching;Discomfort Pain Intervention(s): Medication (See eMAR)   Therapy/Group: Individual Therapy  Valma Cava 10/22/2020, 1:16 PM

## 2020-10-22 NOTE — Progress Notes (Signed)
Physical Therapy TBI Note  Patient Details  Name: Charles Weeks MRN: [id number] Date of Birth: December 23, 1965  Today's Date: 10/22/2020 PT Individual Time: 1005-1100 PT Individual Time Calculation (min): 55 min   Short Term Goals: Week 2:  PT Short Term Goal 1 (Week 2): STGs = LTGs  Skilled Therapeutic Interventions/Progress Updates:     Patient in recliner with TLSO donned in the room with NP from Critical Care discussing patient's airway and trach upon PT arrival. Patient missed 5 min due to patient discussion with NP. Patient alert and agreeable to PT session. Patient reported 7/10 back pain during session, RN made aware. PT provided repositioning, rest breaks, and distraction as pain interventions throughout session. Patient was informed that he will need to keep the trach longer, see NP note for details, and reported feeling "grumpy" at beginning of session. Patient not interested in discussing concerns or coping strategies, but was agreeable to going outside during session for improved patient affect.   Adjusted TLSO straps and positioning x2 during session due to chest piece sliding up toward his trach. Improved positioning after second trial.  Vitals: 5L/min O2 via Trach collar with PMSV donned throughout session, SPO2 87-100%, desaturation intermittent with ambulation activities, recovered <1 min in sitting with cues for pursed lip breathing, HR 70s at rest and 100s with activity.  Therapeutic Activity: Transfers: Patient performed stand pivot recliner to w/c and sit to/from stand x3 from w/c with close supervision for safety/balance. Provided verbal cues for scooting forward and controlled descent for safety.  Gait Training:  Patient ambulated 92 feet and 125 feet without AD with CGA for safety/balance over unlevel surfaces. Ambulated with decreased gait speed, step height and step length, decreased arm swing, mild downward head gaze, and increased crossing over on turns leading to LOB  x2 requiring min A to correct. Provided verbal cues for looking ahead, paced breathing, increased step height for improved balance, and pacing to reduce rushing at the end and maintain O2 saturations. Patient performed forwards/backwards walking 4x12 ft focused on increased step length and foot clearance both directions with CGA. He then performed side-stepping 12 feet R/L x4 focused on lateral hip activation for stepping and for stance limb for stability with CGA.   Patient required frequent rest breaks and increased time for mobility throughout session due to decreased activity tolerance. Discussed outdoor activities patient use to enjoy, as he stated he does not do any yard work any more. Assisted with finding ways to participate in small tasks outdoors in sitting or with breaks and ways to participate in playing fetch with his sister's dog during rest breaks.   Patient in recliner with TLSO donned at end of session with breaks locked, chair alarm set, and all needs within reach.    Therapy Documentation Precautions:  Precautions Precautions: Back Precaution Booklet Issued: Yes (comment) Precaution Comments: trach collar, TLSO don with rolling if HOB >30,  abdominal binder to protect PEG, c-collar d/c'ed 5/24 Required Braces or Orthoses: Spinal Brace Cervical Brace: Hard collar,At all times Spinal Brace: Thoracolumbosacral orthotic,Applied in supine position Restrictions Weight Bearing Restrictions: No Agitated Behavior Scale: TBI Observation Details Observation Environment: [facility] Start of observation period - Date: 10/22/20 Start of observation period - Time: 1005 End of observation period - Date: 10/22/20 End of observation period - Time: 1100 Agitated Behavior Scale (DO NOT LEAVE BLANKS) Short attention span, easy distractibility, inability to concentrate: Absent Impulsive, impatient, low tolerance for pain or frustration: Absent Uncooperative, resistant to care, demanding:  Absent  Violent and/or threatening violence toward people or property: Absent Explosive and/or unpredictable anger: Absent Rocking, rubbing, moaning, or other self-stimulating behavior: Absent Pulling at tubes, restraints, etc.: Absent Wandering from treatment areas: Absent Restlessness, pacing, excessive movement: Absent Repetitive behaviors, motor, and/or verbal: Absent Rapid, loud, or excessive talking: Absent Sudden changes of mood: Absent Easily initiated or excessive crying and/or laughter: Absent Self-abusiveness, physical and/or verbal: Absent Agitated behavior scale total score: 14     Therapy/Group: Individual Therapy  Evanie Buckle L Cadee Agro PT, DPT  10/22/2020, 12:41 PM

## 2020-10-22 NOTE — Progress Notes (Signed)
NAME:  Charles Weeks, MRN:  [id number], DOB:  02/14/1966, LOS: 13 ADMISSION DATE:  10/09/2020, CONSULTATION DATE:  6/3 REFERRING MD:  Riley Kill, CHIEF COMPLAINT:  Trach management and COPD  History of Present Illness:  55 year old male who we follow in clinic for GOLD IV COPD (FEV1 27%). Just discharged from [facility] 5/24 to in-patient rehab after he sustained multiple traumatic injuries from a fall on 4/1 (C7 transverse process fx, 3 column T8 fracture, T7 neuro arch fracture, multiple rib fractures w/ hemo-pneumothorax requiring chest tube). This resulted in prolonged need for mechanical ventilation w/ hosp course c/b difficulty weaning, encephalopathy and ultimately trach/peg.   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  Most recent hospital course also complicated by agitated delirium   Significant Hospital Events: Including procedures, antibiotic start and stop dates in addition to other pertinent events   . 5/24 admitted to in-pt rehab. At time of admit was using PMV and tolerating diet. Had cuffless trach (changed from cuff on 5/20) . 5/24 - 6/3 on 5/28 sats low req increased oxygen needing to go back on ATC. Started on augmentin 5/29 for tracheobronchitis. 6/1 seen by neuro-psych for depression and anxiety. Working w/ PT and OT. Tolerating rehab efforts. Trach team asked to see on 6/3  . 6/5 had to be emergently suctioned. Had marked distress w/ increased oxygen needs.  . 6/6 asked ENT to see.   Interim History / Subjective:  Feels better   Objective   Blood pressure (Abnormal) 141/89,  pulse 72, temperature 97.9 F (36.6 C), temperature source Oral, resp. rate 18, height 5\' 8"  (1.727 m), weight 81.9 kg, SpO2 99 %.    FiO2 (%):  [28 %] 28 %   Intake/Output Summary (Last 24 hours) at 10/22/2020 1026 Last data filed at 10/22/2020 0438 Gross per 24 hour  Intake 880 ml  Output 2525 ml  Net -1645 ml   Filed Weights   10/09/20 1640 10/14/20 1410  Weight: 80.5 kg 81.9 kg    Examination: General this is a 55 year old chronically ill male. He is up in chair. No distress HENT NCAT #4 trach is in place. Phonation quality audible and easy to understand. 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 But my concern is that his cough is not as effective w/ trach occlusion and he has audible high-pitch stridor quality to his cough during finger occlusion. Also to further add to this the fact that he had so much trouble breathing when he needed to be suctioned. I would have expected him to be able to expectorate from his mouth. 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   Plan I have placed a call to ENT. He is going to need further evaluation..may need tracheal dilation or laser debridement of granulation tissue. Either way he should NOT be decannulated any time soon and certainly not without ENT eval Will ask trach team to start trach teaching. He  needs to be able to care for his trach and suction.  Cont routine trach care Cont pred taper Cont BDs Would use PMV instead of capping for now.    Simonne Martinet ACNP-BC [facility] Pulmonary/Critical Care Pager # [phone] OR # [phone] if no answer

## 2020-10-22 NOTE — Progress Notes (Signed)
PROGRESS NOTE   Subjective/Complaints: Breathing "up and down". No problems last night. Pains seems controlled. Nipples are sore.  ROS: Patient denies fever, rash, sore throat, blurred vision, nausea, vomiting, diarrhea, chest pain, joint or back pain, headache, or mood change.   Objective:   No results found. No results for input(s): WBC, HGB, HCT, PLT in the last 72 hours. No results for input(s): NA, K, CL, CO2, GLUCOSE, BUN, CREATININE, CALCIUM in the last 72 hours.  Intake/Output Summary (Last 24 hours) at 10/22/2020 1017 Last data filed at 10/22/2020 0438 Gross per 24 hour  Intake 880 ml  Output 2759 ml  Net -1879 ml        Physical Exam: Vital Signs Blood pressure (!) 141/89, pulse 72, temperature 97.9 F (36.6 C), temperature source Oral, resp. rate 18, height 5\' 8"  (1.727 m), weight 81.9 kg, SpO2 99 %. Constitutional: No distress . Vital signs reviewed. HEENT: EOMI, oral membranes moist Neck: supple Cardiovascular: RRR without murmur. No JVD    Respiratory/Chest: CTA Bilaterally without wheezes or rales. Fair air movement today    GI/Abdomen: BS +, non-tender, non-distended Ext: no clubbing, cyanosis, or edema Psych: flat but cooperative Skin: scattered abrasions, nipples sl tender, sitting along front edge of CTO Neurologic: Cranial nerves II through XII intact, motor strength is 3- Right and 4 /5 Left  deltoid, 4/5 bilateral bicep, tricep, grip, hip flexor, knee extensors, ankle dorsiflexor and plantar flexor Sensory exam normal sensation to light touch and proprioception in bilateral upper  extremities  Musculoskeletal: some pain with PROM RIght shoulder abd/flex --still present   Assessment/Plan: 1. Functional deficits which require 3+ hours per day of interdisciplinary therapy in a comprehensive inpatient rehab setting.  Physiatrist is providing close team supervision and 24 hour management of active  medical problems listed below.  Physiatrist and rehab team continue to assess barriers to discharge/monitor patient progress toward functional and medical goals  Care Tool:  Bathing    Body parts bathed by patient: Right arm,Left arm,Chest,Abdomen,Front perineal area,Buttocks,Right upper leg,Left upper leg,Face   Body parts bathed by helper: Left lower leg,Right lower leg     Bathing assist Assist Level: Moderate Assistance - Patient 50 - 74%     Upper Body Dressing/Undressing Upper body dressing   What is the patient wearing?: Orthosis (TLSO)    Upper body assist Assist Level: Total Assistance - Patient < 25%    Lower Body Dressing/Undressing Lower body dressing      What is the patient wearing?: Hospital gown only     Lower body assist       Toileting Toileting    Toileting assist Assist for toileting: Minimal Assistance - Patient > 75%     Transfers Chair/bed transfer  Transfers assist     Chair/bed transfer assist level: Contact Guard/Touching assist     Locomotion Ambulation   Ambulation assist      Assist level: Contact Guard/Touching assist Assistive device: Walker-rolling Max distance: 100   Walk 10 feet activity   Assist     Assist level: Contact Guard/Touching assist Assistive device: Walker-rolling   Walk 50 feet activity   Assist    Assist level: Contact Guard/Touching  assist Assistive device: Walker-rolling    Walk 150 feet activity   Assist Walk 150 feet activity did not occur: Safety/medical concerns         Walk 10 feet on uneven surface  activity   Assist Walk 10 feet on uneven surfaces activity did not occur: Safety/medical concerns         Wheelchair     Assist Will patient use wheelchair at discharge?: No             Wheelchair 50 feet with 2 turns activity    Assist            Wheelchair 150 feet activity     Assist          Blood pressure (!) 141/89, pulse 72,  temperature 97.9 F (36.6 C), temperature source Oral, resp. rate 18, height 5\' 8"  (1.727 m), weight 81.9 kg, SpO2 99 %.    Medical Problem List and Plan: 1.Debilitysecondary to fall 08/17/20 with 3 column T8 vertebral body fracture/T7 neural arch fracture/right TVP T4-8/right C7 transverse process fracture through the foramen. CTO to be applied in supine position -patient may shower with CTO -ELOS/Goals:10/31/20- supervision to min A            -Continue CIR therapies including PT, OT, and SLP  2. Impaired mobility -DVT/anticoagulation:Continue Lovenox.               -vascular study negative -antiplatelet therapy: Aspirin 81 mg daily 3. Pain Management:Continue Oxycodone as needed             -?mild neuropathic pain RUE. ?C7 radic---continue gabapentin 100mg  tid   -could be  RTC also. Continue lidocaine patch   -continue with support, ROM for now. Seems improved 4. Mood:Continue Wellbutrin 300 mg daily, BuSpar 10 mg 3 times daily, Klonopin 0.5 mg nightly, -antipsychotic agents: Seroquel 150 mg twice daily and 300 mg nightly             -behavior has been appropriate 5. Neuropsych: This patientis notcapable of making decisions on hisown behalf. 6. Skin/Wound Care:Routine skin checks 7. Fluids/Electrolytes/Nutrition:               -recent lab work is within normal limits.             -good po intake 8. Acute blood loss anemia. Follow-up CBC 9. Multiple bilateral rib fractures. Conservative care 10. 3: T8 vertebral body fracture. Follow-up neurosurgery Dr. 11/02/20. Nonoperative.              -CTO for HOB>30 degrees at this point-  Injury 08/17/20  -nipple pain is from contact with CTO---may try to tape nipples, lanolin oil  11. T7 neural arch fracture. Follow-up neurosurgery 12. Right TVP T4-8. Pain control follow neurosurgery 13. Right C7 TVP fracture through foramen. CTA negative. CTO 14. Large scalp laceration  right eyebrow laceration. Repaired 08/17/2020. Sutures removed 15. Hypertension. Lopressor 25 mg twice daily 16. CAD/MI/DES. Continue aspirin 17. Acute hypercarbic ventilatory dependent respiratory failure/COPD. Tracheostomy 09/03/2020 per Dr. 10/17/2020. Changed back to #6 cuffless 10/05/2020 due to secretions. Patient will follow up long-term with Dr. Janee Morn           CBC CXR unremarkable   -5/31 back to 5L oxygen. Wbc's down yesterday. occ low grade temp, resp cx pend   -continue augmentin for now   -continue trach mgt, breathing treatments etc  6/6    generally improved, less work of breathing   -trach gs/cx with WBC's rare stenotrophomonas   -  continue prednisone 10mg , decrease to 5mg  bid starting with PM dose today   -continue above nebs, inhalers, O2   -appreciate [facility] consult, now with #4--not sure he was capped this w/e   -re-ordered capping trial---observe today    18. Dysphagia. Gastrostomy tube 09/03/2020 per Dr. [facility].              -Tolerating Dysphagia #3 thin liquids--advance per SLP 19. BPH. Continue Hytrin/Flomax/Urecholine---on max doses              -Large bladder volumes             -in/out caths q4-6 hours---still not voiding.             -oob to void for all attempts   6/3 increased flomax to bid  6/6 likely will need to go home with foley--don't I/O caths are feasible unless family can perform given his pain, CTO 20. History of alcohol tobacco abuse. Provide counseling 21. Constipation---+bm 6/5    LOS: 13 days A FACE TO FACE EVALUATION WAS PERFORMED  09/05/2020 10/22/2020, 10:17 AM

## 2020-10-22 NOTE — Consult Note (Signed)
ENT CONSULT:  Reason for Consult: Upper airway evaluation, tracheostomy status  Referring Physician:  Zenia Resides, NP   HPI: Charles Weeks is an 55 y.o. male with history of COPD, recently discharged from [facility] to inpatient rehab on 05/24 after sustaining multiple traumatic injuries from a fall on 04/01(C7 transverse process fx, 3 column T8 fracture, T7 neuro arch fracture, multiple rib fractures w/ hemo-pneumothorax requiring chest tube). This resulted in prolonged need for mechanical ventilation w/ hosp course c/b difficulty weaning, encephalopathy and ultimately trach/peg.    Patient was recently downsized to a size 4 cuffless tracheostomy, and required emergent suctioning with marked distress and increased oxygen needs on 06/05. Pulmonology consulted ENT for further evaluation.  Patient reports no symptoms of shortness of breath. He is tolerating a diet without issues. He reports he is a former smoker. He quit 3 months ago, and had 30 pack year history prior to quitting. He denies significant dysphonia prior to his accident.   Past Medical History:  Diagnosis Date  . Anxiety   . Anxiety 01/19/2020  . Arthritis   . Benign prostatic hyperplasia with weak urinary stream 12/08/2019  . BPH (benign prostatic hyperplasia) 08/17/2020  . CAD (coronary artery disease) 08/17/2020  . Chronic respiratory failure with hypoxia (HCC) 01/24/2020   Has home 02 but not using as of 01/24/2020  -  01/24/2020   Walked RA  approx   200 ft  @ slow pace  stopped due to  Dizzy with sats still 95%     . Cigarette smoker 01/24/2020   Stopped regular smoking 10/2019 but still "now and then" as of 01/24/2020   . COPD (chronic obstructive pulmonary disease) (HCC)   . COPD (chronic obstructive pulmonary disease) (HCC) 08/17/2020   gold  . COPD GOLD ?     Quit smoking 10/2019  - Labs ordered 01/24/2020  :  allergy profile   alpha one AT phenotype   - 01/24/2020  After extensive coaching inhaler device,  effectiveness =    75% (short  Ti) > change symb 80 to 160 2bid     . Coronary artery disease 06/08/2018   DES to RCA, [facility] [place], [place])  . Depression   . Depression, major, single episode, moderate (HCC) 01/19/2020  . Encounter for screening for malignant neoplasm of colon 12/08/2019  . Essential hypertension   . Essential hypertension 12/08/2019  . History of MI (myocardial infarction) 01/19/2020  . HTN (hypertension) 08/17/2020  . Myocardial infarct (HCC)   . Myocardial infarct (HCC)   . Respiratory failure with hypoxia [facility])     Past Surgical History:  Procedure Laterality Date  . BOWEL RESECTION  2006  . PEG PLACEMENT N/A 09/03/2020   Procedure: PERCUTANEOUS ENDOSCOPIC GASTROSTOMY (PEG) PLACEMENT;  Surgeon: Violeta Gelinas, MD;  Location: [facility] OR;  Service: General;  Laterality: N/A;  . PERCUTANEOUS TRACHEOSTOMY N/A 09/03/2020   Procedure: PERCUTANEOUS TRACHEOSTOMY USING 6 SHILEY;  Surgeon: Violeta Gelinas, MD;  Location: [facility] OR;  Service: General;  Laterality: N/A;  . SKIN GRAFT    . TONSILLECTOMY AND ADENOIDECTOMY      Family History  Problem Relation Age of Onset  . Heart disease Mother   . Heart disease Father   . Diabetes Sister     Social History:  reports that he has been smoking. He has never used smokeless tobacco. He reports current alcohol use. He reports previous drug use.  Allergies: No Known Allergies  Medications: I have reviewed the patient's current medications.  Results for orders placed or performed during the hospital encounter of 10/09/20 (from the past 48 hour(s))  Glucose, capillary     Status: Abnormal   Collection Time: 10/21/20  6:24 AM  Result Value Ref Range   Glucose-Capillary 156 (H) 70 - 99 mg/dL    Comment: Glucose reference range applies only to samples taken after fasting for at least 8 hours.   Comment 1 Notify RN   Glucose, capillary     Status: Abnormal   Collection Time: 10/22/20  6:02 AM  Result Value Ref Range   Glucose-Capillary 142 (H) 70 - 99  mg/dL    Comment: Glucose reference range applies only to samples taken after fasting for at least 8 hours.    No results found.  ROS:ROS  Blood pressure 119/77, pulse 77, temperature 99 F (37.2 C), temperature source Oral, resp. rate 18, height 5\' 8"  (1.727 m), weight 81.9 kg, SpO2 94 %.  PHYSICAL EXAM: CONSTITUTIONAL: well developed, nourished, no distress and alert and oriented x 3 PULMONARY/CHEST WALL: effort normal and no stridor, no stertor, hoarseness noted HENT: Head : normocephalic and atraumatic Ears: Right ear:   canal normal, external ear normal and hearing normal Left ear:   canal normal, external ear normal and hearing normal Nose: nose normal and no purulence Mouth/Throat:  Mouth: uvula midline and no oral lesions Throat: oropharynx clear and moist Mucous membranes: normal EYES: conjunctiva normal, EOM normal and PERRL NECK: Shiley tracheostomy in place, secured with Posey ties, no bleeding around trach. No palpable cervical LAD  Studies Reviewed:CXR results reviewed  Procedure: Transnasal fiberoptic laryngoscopy and tracheoscopy  Anesth: Topical with 4% lidocaine Compl: None Findings: Normal vocal fold movement, supraglottic compression with phonation and following normal respirations. Subglottic granulation tissue causing moderate obstruction. Images placed in media folder. Tracheoscopy demonstrates normal distal trachea to carina.  Description:  After discussing risks, benefits, and alternatives, the patient was placed in a seated position and the right nasal passage was sprayed with topical anesthetic.  The fiberoptic scope was passed through the left nasal passage to view the pharynx and larynx.  Findings are noted above.  The scope was then removed and passed through the existing Shiley tracheostomy and used to view the trachea with findings as above. This was removed, inner cannula, PMV and trach collar replaced, and he was returned to nursing care in stable  condition.   Assessment/Plan: Charles Weeks is a 55 y/o M with history of COPD, tracheostomy dependence following multiple traumatic injuries from a fall on 04/01.  - Complete head and neck examination including flexible nasolaryngoscopy and tracheoscopy demonstrates moderate subglottic granulation tissue obstructing about 75% of patient's airway above level of tracheostomy. Vocal fold movement is intact, patient has moderate supraglottic compression with phonation and following inspiration consistent with muscle tension dysphonia.  - Patient will require direct laryngoscopy with subglottic dilation in the future. Advised patient that tracheostomy would remain in place in post-operative phase, until serial postop exams confirms no re-occurrence of granulation tissue. He expressed understanding.  - Recommend continued routine trach care. OK for PMV. Would not advise trach capping. - Surgical procedure will require clearance for neck extension, will need to obtain clearance from neurosurgery prior to scheduling procedure. Anticipate this will be done on an outpatient basis once patient is discharged from rehab.   Thank you for allowing me to participate in the care of this patient. Please do not hesitate to contact me with any questions or concerns.   Demontrae Gilbert A Ardene Remley,  DO Otolaryngology [facility] ENT Cell: [phone]   10/22/2020, 5:03 PM

## 2020-10-22 NOTE — Progress Notes (Signed)
Patient's trach is capped. Pt placed on 2lpm  due to desaturation on Room Air to 85%.

## 2020-10-23 LAB — GLUCOSE, CAPILLARY: Glucose-Capillary: 198 mg/dL — ABNORMAL HIGH (ref 70–99)

## 2020-10-23 MED ORDER — BETHANECHOL CHLORIDE 25 MG PO TABS
50.0000 mg | ORAL_TABLET | Freq: Four times a day (QID) | ORAL | Status: DC
  Administered 2020-10-23 – 2020-10-31 (×32): 50 mg via ORAL
  Filled 2020-10-23 (×33): qty 2

## 2020-10-23 NOTE — Progress Notes (Signed)
Occupational Therapy Weekly Progress Note  Patient Details  Name: Charles Weeks MRN: [id number] Date of Birth: 06-30-1965  Beginning of progress report period: Oct 10, 2020 End of progress report period: October 23, 2020  Today's Date: 10/23/2020 OT Individual Time: 0092-3300 OT Individual Time Calculation (min): 55 min    Patient has met 1 of 3 short term goals.  Pt is making steady progress towards OT goals. Pt has demonstrated improved overall strength and activity tolerance within BADL tasks. O2 needs have decreased this week and he is working hard to progress with therapy. Pt needs min A for LB ADLs, but max A for UB at bed level. We are working on pt directing care as he will continue to need assist for UB ADLs and donning TLSO at bed level.   Patient continues to demonstrate the following deficits: muscle weakness, decreased cardiorespiratoy endurance and decreased oxygen support and decreased standing balance, decreased postural control, decreased balance strategies and difficulty maintaining precautions and therefore will continue to benefit from skilled OT intervention to enhance overall performance with BADL and Reduce care partner burden.  Patient progressing toward long term goals..  Continue plan of care.  OT Short Term Goals Week 2:  OT Short Term Goal 1 (Week 2): Pt will instruct caregiver on donning TLSO with min cueing OT Short Term Goal 1 - Progress (Week 2): Progressing toward goal OT Short Term Goal 2 (Week 2): Patient will tolerate standing for 5 minutes in prep for BADL task OT Short Term Goal 2 - Progress (Week 2): Met OT Short Term Goal 3 (Week 2): Pt will use AE for LB dressing with min questioning cues OT Short Term Goal 3 - Progress (Week 2): Progressing toward goal Week 3:  OT Short Term Goal 1 (Week 3): LTG=STG 2/2 ELOS  Skilled Therapeutic Interventions/Progress Updates:    Pt greeted semi-reclined in bed with HOB above 30 degrees an no back brace on. Education  again on importance of maintain back precautions. Rolling in bed with supervision, then OT assist to doff shirt. Pt needed verbal cues not to arch back or twist when trying to assist with doffing shirt. Pt able to wash upper body in supine with set-up A and OT assist to wash back. Pt able to help put UEs through new shirt, then rolling to pull down back and OT assist to don TLSO in rolling. Pt on room air, just FiO2, and desating to 83% after rolling activity. Pt placed back on O2 5L FiO2 28%. O2 sats maintained at 901-93% with activity on O2. Pt completed bed mobility with min A. LB bathing/dressing cmopleted sit<>stand at EOB with multiple seated rest breaks. Pt needed max cues to maintain back precautions within LB ADLs. Pt resistive to using reacher and sock-aid, but he is unable to complete LB tasks safely without them. OT continued to educate on importance of maintaining these precautions. Worked on standing balance/endurance with standing grooming tasks. Pt stood to shave and wash face for 5 minutes. Pt reported max fatigue and left seated in recliner with chair alarm on, call bell in reach, and needs met.   Therapy Documentation Precautions:  Precautions Precautions: Back Precaution Booklet Issued: Yes (comment) Precaution Comments: trach collar, TLSO don with rolling if HOB >30,  abdominal binder to protect PEG, c-collar d/c'ed 5/24 Required Braces or Orthoses: Spinal Brace Cervical Brace: Hard collar,At all times Spinal Brace: Thoracolumbosacral orthotic,Applied in supine position Restrictions Weight Bearing Restrictions: No Pain: Pain Assessment Pain Scale: 0-10 Pain  Score: 4 Pain Type: Acute pain Pain Location: Shoulder (and back) Pain Orientation: Right Pain Radiating Towards: back Pain Descriptors / Indicators: Discomfort Pain Frequency: Constant Pain Onset: With Activity Patients Stated Pain Goal: 3 Pain Intervention(s): Repositioned  Therapy/Group: Individual  Therapy  Valma Cava 10/23/2020, 9:41 AM

## 2020-10-23 NOTE — Progress Notes (Signed)
PROGRESS NOTE   Subjective/Complaints: No major issues overnight. Right shoulder chronically painful. Able to sleep. Still not emptying bladder although having some incontinence. Volumes a little better  ROS: Patient denies fever, rash, sore throat, blurred vision, nausea, vomiting, diarrhea, cough, shortness of breath or chest pain, headache, or mood change.   Objective:   No results found. No results for input(s): WBC, HGB, HCT, PLT in the last 72 hours. No results for input(s): NA, K, CL, CO2, GLUCOSE, BUN, CREATININE, CALCIUM in the last 72 hours.  Intake/Output Summary (Last 24 hours) at 10/23/2020 1257 Last data filed at 10/23/2020 1224 Gross per 24 hour  Intake 480 ml  Output 2800 ml  Net -2320 ml        Physical Exam: Vital Signs Blood pressure 119/80, pulse 100, temperature 98.2 F (36.8 C), temperature source Oral, resp. rate 18, height 5\' 8"  (1.727 m), weight 81.9 kg, SpO2 94 %. Constitutional: No distress . Vital signs reviewed. HEENT: EOMI, oral membranes moist Neck: supple Cardiovascular: RRR without murmur. No JVD    Respiratory/Chest: CTA Bilaterally without wheezes or rales. Normal effort    GI/Abdomen: BS +, non-tender, non-distended Ext: no clubbing, cyanosis, or edema Psych: pleasant and cooperative Skin: scattered abrasions, nipples sl tender, sitting along front edge of CTO Neurologic: Cranial nerves II through XII intact, motor strength is 3- Right and 4 /5 Left  deltoid, 4/5 bilateral bicep, tricep, grip, hip flexor, knee extensors, ankle dorsiflexor and plantar flexor Normal sensory  Musculoskeletal: some pain with PROM RIght shoulder abd/flex/IR/ER     Assessment/Plan: 1. Functional deficits which require 3+ hours per day of interdisciplinary therapy in a comprehensive inpatient rehab setting.  Physiatrist is providing close team supervision and 24 hour management of active medical problems  listed below.  Physiatrist and rehab team continue to assess barriers to discharge/monitor patient progress toward functional and medical goals  Care Tool:  Bathing    Body parts bathed by patient: Right arm,Left arm,Chest,Abdomen,Front perineal area,Buttocks,Right upper leg,Left upper leg,Face   Body parts bathed by helper: Left lower leg,Right lower leg     Bathing assist Assist Level: Moderate Assistance - Patient 50 - 74%     Upper Body Dressing/Undressing Upper body dressing   What is the patient wearing?: Orthosis (TLSO)    Upper body assist Assist Level: Total Assistance - Patient < 25%    Lower Body Dressing/Undressing Lower body dressing      What is the patient wearing?: Underwear/pull up,Pants     Lower body assist Assist for lower body dressing: Minimal Assistance - Patient > 75%     Toileting Toileting    Toileting assist Assist for toileting: Minimal Assistance - Patient > 75%     Transfers Chair/bed transfer  Transfers assist     Chair/bed transfer assist level: Contact Guard/Touching assist     Locomotion Ambulation   Ambulation assist      Assist level: Contact Guard/Touching assist Assistive device: No Device Max distance: 165ft   Walk 10 feet activity   Assist     Assist level: Contact Guard/Touching assist Assistive device: No Device   Walk 50 feet activity   Assist  Assist level: Contact Guard/Touching assist Assistive device: No Device    Walk 150 feet activity   Assist Walk 150 feet activity did not occur: Safety/medical concerns  Assist level: Contact Guard/Touching assist Assistive device: No Device    Walk 10 feet on uneven surface  activity   Assist Walk 10 feet on uneven surfaces activity did not occur: Safety/medical concerns         Wheelchair     Assist Will patient use wheelchair at discharge?: No             Wheelchair 50 feet with 2 turns activity    Assist             Wheelchair 150 feet activity     Assist          Blood pressure 119/80, pulse 100, temperature 98.2 F (36.8 C), temperature source Oral, resp. rate 18, height 5\' 8"  (1.727 m), weight 81.9 kg, SpO2 94 %.    Medical Problem List and Plan: 1.Debilitysecondary to fall 08/17/20 with 3 column T8 vertebral body fracture/T7 neural arch fracture/right TVP T4-8/right C7 transverse process fracture through the foramen. CTO to be applied in supine position -patient may shower with CTO -ELOS/Goals:10/31/20- supervision to min A            -Interdisciplinary Team Conference today    2. Impaired mobility -DVT/anticoagulation:Continue Lovenox.               -vascular study negative -antiplatelet therapy: Aspirin 81 mg daily 3. Pain Management:Continue Oxycodone as needed             -?mild neuropathic pain RUE. ?C7 radic---continue gabapentin 100mg  tid   -could be  RTC also. Continue lidocaine patch   -continue with support, ROM for now. Seems tolerable 4. Mood:Continue Wellbutrin 300 mg daily, BuSpar 10 mg 3 times daily, Klonopin 0.5 mg nightly, -antipsychotic agents: Seroquel 150 mg twice daily and 300 mg nightly             -behavior has been appropriate 5. Neuropsych: This patientis notcapable of making decisions on hisown behalf. 6. Skin/Wound Care:Routine skin checks 7. Fluids/Electrolytes/Nutrition:               -recent lab work is within normal limits.             -good po intake 8. Acute blood loss anemia. Follow-up CBC 9. Multiple bilateral rib fractures. Conservative care 10. 3: T8 vertebral body fracture. Follow-up neurosurgery Dr. 11/02/20. Nonoperative.              -CTO for HOB>30 degrees at this point-  Injury 08/17/20  -nipple pain is from contact with CTO--- tape nipples, lanolin oil  11. T7 neural arch fracture. Follow-up neurosurgery 12. Right TVP T4-8. Pain control follow neurosurgery 13.  Right C7 TVP fracture through foramen. CTA negative. CTO 14. Large scalp laceration right eyebrow laceration. Repaired 08/17/2020. Sutures removed 15. Hypertension. Lopressor 25 mg twice daily 16. CAD/MI/DES. Continue aspirin 17. Acute hypercarbic ventilatory dependent respiratory failure/COPD. Tracheostomy 09/03/2020 per Dr. 10/17/2020. Changed back to #6 cuffless 10/05/2020 due to secretions. Patient will follow up long-term with Dr. Janee Morn           CBC CXR unremarkable   -5/31 back to 5L oxygen. Wbc's down yesterday. occ low grade temp, resp cx pend   -continue augmentin for now   -continue trach mgt, breathing treatments etc  6/7    Appreciate ENT consult. Moderate subglottic granulation causing 75% obstruction of  airway above trach. +muscle tension dysphonia   -needs direct laryngoscopy with dilatation in future which requires neck extension. Probably will do as outpt once pt is out of CTO. F/u with NS regarding brace timing    -continue prednisone 5mg  bid     -continue above nebs, inhalers, O2   -appreciate PCCCM follow up   -continue #4 with PMV only        18. Dysphagia. Gastrostomy tube 09/03/2020 per Dr. 09/05/2020.              -Tolerating Dysphagia #3 thin liquids--advance per SLP 19. BPH. Continue Hytrin/Flomax/Urecholine---on max doses              -Large bladder volumes             -in/out caths q4-6 hours---still not voiding.             -oob to void for all attempts   6/3 increased flomax to bid  6/6 likely will need to go home with foley--don't I/O caths are feasible unless family can perform given his pain, CTO  6/7 perhaps some signs of trying to empy--increase urecholine to QID 20. History of alcohol tobacco abuse. Provide counseling 21. Constipation---+bm 6/5    LOS: 14 days A FACE TO FACE EVALUATION WAS PERFORMED  8/7 10/23/2020, 12:57 PM

## 2020-10-23 NOTE — Progress Notes (Signed)
Physical Therapy TBI Note  Patient Details  Name: Charles Weeks MRN: [id number] Date of Birth: 26-Dec-1965  Today's Date: 10/23/2020 PT Individual Time: 0254-2706 and  PT Individual Time Calculation (min): 30 min  and  Today's Date: 10/23/2020 PT Missed Time: 30 Minutes Missed Time Reason: Patient fatigue;Pain  Short Term Goals: Week 2:  PT Short Term Goal 1 (Week 2): STGs = LTGs  Skilled Therapeutic Interventions/Progress Updates:    Session 1: Pt received sitting in recliner and agreeable to therapy session. Pt wearing TLSO brace throughout session - intermittently would re-adjust during session as it rides up towards trach. Pt received and maintained PMSV during session and on 5L of O2 via trach collar - SpO2 decreased to 74% after gait training to day room but otherwise maintained >90% throughout session - HR 97-125bpm during session - notified RN of elevated HR. Sit<>stands, no AD, with CGA for steadying during session. Gait training ~165ft to main therapy gym, no AD, with CGA for steadying - demos slight increase R/L postural sway. Pt requires frequent seated rest breaks during session with cuing for pursed lip breathing, poor understanding of this breathing technique and poor carryover throughout session. Dynamic gait training including horizontal head turns, sudden start/stops, and backwards ambulation with pt noted to have minimal increased postural sway requiring closer to min assist for balance. Dynamic standing balance task via alternate B LE foot taps on 4" step with min assist for balance progressed to repeated R/L step up/down on/off 4" step with min assist for balance and pt demoing increased instability when stepping back down with R LE as opposed to L LE. Gait training ~148ft back to room, no AD, with CGA for steadying - SpO2 >90% this time. Pt left seated in recliner with needs in reach, brace in place, lines intact, PMSV in place, and chair alarm on.   Session 2: Pt received  sitting in recliner with RN present for medication administration. Pt reports 8/10 pain and fatigue. Pt politely requests not to participate in therapy at this time stating he is feeling fatigued this afternoon. Pt left sitting in recliner with needs in reach, lines intact, TLSO brace in place, chair alarm on, and RN present. Missed 30 minutes of skilled physical therapy.  Therapy Documentation Precautions:  Precautions Precautions: Back Precaution Booklet Issued: Yes (comment) Precaution Comments: trach collar, TLSO don with rolling if HOB >30,  abdominal binder to protect PEG, c-collar d/c'ed 5/24 Required Braces or Orthoses: Spinal Brace Cervical Brace: Hard collar,At all times Spinal Brace: Thoracolumbosacral orthotic,Applied in supine position Restrictions Weight Bearing Restrictions: No  Pain: Session 1: Upon questioning pt states "I always hurt" but otherwise no complaints of pain - RN notified for medication administration at end of session.  Agitated Behavior Scale: TBI Session 1: Observation Details Observation Environment: CIR Start of observation period - Date: 10/23/20 Start of observation period - Time: 1300 End of observation period - Date: 10/23/20 End of observation period - Time: 0940 Agitated Behavior Scale (DO NOT LEAVE BLANKS) Short attention span, easy distractibility, inability to concentrate: Absent Impulsive, impatient, low tolerance for pain or frustration: Absent Uncooperative, resistant to care, demanding: Absent Violent and/or threatening violence toward people or property: Absent Explosive and/or unpredictable anger: Absent Rocking, rubbing, moaning, or other self-stimulating behavior: Absent Pulling at tubes, restraints, etc.: Absent Wandering from treatment areas: Absent Restlessness, pacing, excessive movement: Absent Repetitive behaviors, motor, and/or verbal: Absent Rapid, loud, or excessive talking: Absent Sudden changes of mood:  Absent Easily initiated or excessive  crying and/or laughter: Absent Self-abusiveness, physical and/or verbal: Absent Agitated behavior scale total score: 14   Therapy/Group: Individual Therapy  Ginny Forth , PT, DPT, CSRS  10/23/2020, 8:21 AM

## 2020-10-23 NOTE — Patient Care Conference (Signed)
Inpatient RehabilitationTeam Conference and Plan of Care Update Date: 10/23/2020   Time: 10:40 AM    Patient Name: Charles Weeks      Medical Record Number: [id number]  Date of Birth: 1965/09/16 Sex: Male         Room/Bed: 4W07C/4W07C-01 Payor Info: Payor: [place] MEDICAID PREPAID HEALTH PLAN / Plan: [place] MEDICAID [employer] / Product Type: *No Product type* /    Admit Date/Time:  10/09/2020  4:38 PM  Primary Diagnosis:  Thoracic spine fracture [facility])  Hospital Problems: Principal Problem:   Thoracic spine fracture [facility]) Active Problems:   Urinary retention due to benign prostatic hyperplasia   Tracheostomy in place [facility])   Depression with anxiety    Expected Discharge Date: Expected Discharge Date: 10/31/20  Team Members Present: Physician leading conference: Dr. Faith Rogue Care Coodinator Present: Cecile Sheerer, LCSWA;Marybella Ethier Marlyne Beards, RN, BSN, CRRN Nurse Present: Kennyth Arnold, RN PT Present: Serina Cowper, PT OT Present: Kearney Hard, OT SLP Present: Feliberto Gottron, SLP PPS Coordinator present : Edson Snowball, PT     Current Status/Progress Goal Weekly Team Focus  Bowel/Bladder   continent of  bowel and continue I &O cathether Q 6-8 hrs, last bowel movenent on 10/21/20.  continue remain continent of  bowel, assess for bladder distention and maintain I &O catheter.  Continue manage bowel and monitor for change in bladder.   Swallow/Nutrition/ Hydration   Dys. 3 textures with thin liquids, Supervision  Mod I  trials of regular textures   ADL's   Min A, CGA overall, still lowe endurance and needs 5-6L of O2 with activity  Supervision  activity tolerance, self-care retraining, general strengthening, pt/family education   Mobility   supervision-CGA with interittent min A for balance, gait up to 175 ft  Supervision  activity tolerance, balance, strengthening, functional mobility, gait and stair training, patient/caregiver education   Communication   #6  cuffless trach with PMSV during all waking hours, Min A  Mod I  increased breath support to maximize intelligibility   Safety/Cognition/ Behavioral Observations  Supervision  Mod I-Supervision  basic problem solving, functional recall, awareness   Pain   c/o pain 8 out of 10 to mid back, oxycodone PRN administered- effective.  Pain level less then 1  Assess pain level Q shift and PRN and manage pain.   Skin   Skin dry and intact, Trach in place, peg tube to LUQ clamped.  remain skin dry and intact  Assess skin Q shift and PRN, use pillow for support.     Discharge Planning:  D/c to home with 24/7 care from his sister. Sister is willing to manage trach at d/c.   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 Patient is not urinating, please get up to standing position to void, and double void. Will have to be discharged with a foley of I&O cathing. Will need to teach family foley care or I&O cathing. Will need to set-up family education for trach care with his sister.  Patient on target to meet rehab goals: yes, min assist. Needs to use adaptive equipment for lower body ADL's, needs cueing to use the equipment. Contact guard to supervision 175 ft. Monitoring when O2 sats are dropping. SLP reports patient is on a dys 3, thin liquid diet.  *See Care Plan and progress notes for long and short-term goals.   Revisions to Treatment Plan:  Teach family trach care.  Teaching Needs: Family education, medication management,  pain management, trach care management, skin/wound care, transfer training, gait training, balance training, endurance, stair training, foley education, I&O catheter training and education, safety awareness.  Current Barriers to Discharge: Decreased caregiver support, Medical stability, Home enviroment access/layout, Trach, Incontinence, Neurogenic bowel and bladder, Lack of/limited family support, Medication  compliance, Behavior and Nutritional means  Possible Resolutions to Barriers: Continue current medications, offer nutritional support, provide emotional support.     Medical Summary Current Status: tracheal stenosis, will need direct laryngoscopy and CTO off. significant urine retention---I/O caths  Barriers to Discharge: Medical stability   Possible Resolutions to Barriers/Weekly Focus: ongoing mgt of secretions, will continue trach after discharge, continue voiding trial, OOB to void, double void   Continued Need for Acute Rehabilitation Level of Care: The patient requires daily medical management by a physician with specialized training in physical medicine and rehabilitation for the following reasons: Direction of a multidisciplinary physical rehabilitation program to maximize functional independence : Yes Medical management of patient stability for increased activity during participation in an intensive rehabilitation regime.: Yes Analysis of laboratory values and/or radiology reports with any subsequent need for medication adjustment and/or medical intervention. : Yes   I attest that I was present, lead the team conference, and concur with the assessment and plan of the team.   Tennis Must 10/23/2020, 3:27 PM

## 2020-10-23 NOTE — Progress Notes (Signed)
Nutrition Follow-up  DOCUMENTATION CODES:   Not applicable  INTERVENTION:  Continue Ensure Enlive po TID, each supplement provides 350 kcal and 20 grams of protein  Encourage adequate PO intake.   NUTRITION DIAGNOSIS:   Inadequate oral intake related to poor appetite as evidenced by meal completion < 25%; improved  GOAL:   Patient will meet greater than or equal to 90% of their needs; met  MONITOR:   PO intake,Supplement acceptance,Diet advancement,Skin,Weight trends,Labs,I & O's  REASON FOR ASSESSMENT:   Consult Enteral/tube feeding initiation and management  ASSESSMENT:   55 year old right-handed male with history of COPD hypertension, alcohol/tobacco use presented with level 1 fall after a fall down approximately 11 stairs. CT cervical spine showed mildly displaced acute fractures of the right C7 transverse process extending to involve the transverse foramen. Fractures through the neural arch of T7.  Multiple right transverse process fractures involving the T4-T8 vertebral bodies.  Multiple bilateral rib fractures at the costovertebral junction from T5-T8 on the right and T7 and T8 on the left. Body fracture non-operative. Hospital course prolonged intubation undergoing tracheostomy as well as gastrostomy tube placement 09/03/2020. Pt with patient decreased functional mobility was admitted for a comprehensive rehab program.  Lurline Idol in place. Pt on a dysphagia 3 diet with thin liquids. Meal completion has been 100%. Appetite and intake has been good. Pt currently has Ensure ordered and has been consuming them. RD to continue with current orders to aid in caloric and protein needs.   Labs and medications reviewed.   Diet Order:   Diet Order            DIET DYS 3 Room service appropriate? Yes; Fluid consistency: Thin  Diet effective now                 EDUCATION NEEDS:   Not appropriate for education at this time  Skin:  Skin Assessment: Skin Integrity Issues: Skin  Integrity Issues:: Incisions Incisions: neck  Last BM:  6/5  Height:   Ht Readings from Last 1 Encounters:  10/09/20 _0  (1.727 m)    Weight:   Wt Readings from Last 1 Encounters:  10/14/20 81.9 kg    BMI:  Body mass index is 27.45 kg/m.  Estimated Nutritional Needs:   Kcal:  2000-2200  Protein:  110-120 grams  Fluid:  >/= 2 L/day  Corrin Parker, MS, RD, LDN RD pager number/after hours weekend pager number on Amion.

## 2020-10-23 NOTE — Progress Notes (Signed)
Patient ID: Charles Weeks, male   DOB: 1965/06/03, 55 y.o.   MRN: [id number]  SW spoke with pt sister [name] to follow-up with updates from team conference, and d/c date remains 6/15. Reports she is fine with helping manage trach. States that she would like for pt to learn how to manage on his own as well. Reports is outpatient therapies, prefers [facility]. No HHA preference if needed. SW informed can still make efforts to get him PCS services for personal care since he still requires assistance. SW informed will begin to work on ordering supplies for patient.   SW spoke with Sandra/CM with [facility] Managed Medicaid([phone]) to inform PCS form faxed to [phone].  SW ordered: trach care kits/supplies, portable suction, humidification, and oxygen with [employer] health liaison Zach.  Cecile Sheerer, MSW, LCSWA Office: [phone] Cell: [phone] Fax: [phone]

## 2020-10-23 NOTE — Progress Notes (Signed)
Occupational Therapy Note  Patient Details  Name: Kail Fraley MRN: [id number] Date of Birth: 1965-10-04  Today's Date: 10/23/2020 OT Missed Time: 13 Minutes Missed Time Reason: Patient fatigue  Pt greeted sitting up in recliner. Pt reported feeling fatigued and did not feel up to participating in OT. OT gave multiple options for participation and encouraged pt, but he continued to refuse. Pt left seated in recliner with needs met.    Daneen Schick Suan Pyeatt 10/23/2020, 3:01 PM

## 2020-10-23 NOTE — Progress Notes (Signed)
Speech Language Pathology TBI Note  Patient Details  Name: Charles Weeks MRN: [id number] Date of Birth: 15-Oct-1965  Today's Date: 10/23/2020 SLP Individual Time: 0998-3382 SLP Individual Time Calculation (min): 15 min Missed time: 15 minutes due to refusal.   Short Term Goals: Week 2: SLP Short Term Goal 1 (Week 2): Patient will consume current diet with minimal overt s/s of aspiration and overall Mod I for use of swallowing compensatory strategies. SLP Short Term Goal 2 (Week 2): Patient will demonstrate efficient mastiction and complete oral clearance with trials of regular textures over 2 sessions prior to upgrade with supervision level verbal cues. SLP Short Term Goal 3 (Week 2): Patient will utilize speech intelliigibility strategies at the sentence level to achieve ~90% intelligibility with supervision verbal cues. SLP Short Term Goal 4 (Week 2): Patient will recall functional information with supervision level verbal cues for use of strategies.  Skilled Therapeutic Interventions: Skilled ST treatment performed with focus on speech goals. Patient was sitting upright in recliner on arrival with PMSV in place. All vitals remained WFL during session. 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 Patient was initially agreeable to education on muscle tension dysphonia including postural considerations to reduce upper body tension in neck and shoulders, and hydration. SLP attempted to initiate Semi Occluded Vocal Tract Exercises (SOVT) however patient refused and stated "I don't want to do it", and "I just don't care". Patient remained persistent in his decision despite SLP's encouragement and attempt to educate on effectiveness to improve dysphonia and quality of life. Session concluded early due to lack of participation. Patient was left in recliner with alarm activated and all needs within reach.       Pain Pain Assessment Pain Scale: 0-10 Pain Score: 0-No pain Faces Pain Scale: No hurt Pain Type: Acute pain Pain Location: Back PAINAD (Pain Assessment in Advanced Dementia) Breathing: normal Negative Vocalization: none Facial Expression: smiling or inexpressive Body Language: relaxed Consolability: no need to console PAINAD Score: 0  Agitated Behavior Scale: TBI Observation Details Observation Environment: Patient room Start of observation period - Date: 10/23/20 Start of observation period - Time: 1330 End of observation period - Date: 10/23/20 End of observation period - Time: 1345 Agitated Behavior Scale (DO NOT LEAVE BLANKS) Short attention span, easy distractibility, inability to concentrate: Present to a slight degree Impulsive, impatient, low tolerance for pain or frustration: Absent Uncooperative, resistant to care, demanding: Present to a slight degree Violent and/or threatening violence toward people or property: Absent Explosive and/or unpredictable anger: Absent Rocking, rubbing, moaning, or other self-stimulating behavior: Absent Pulling at tubes, restraints, etc.: Absent Wandering from treatment areas: Absent Restlessness, pacing, excessive movement: Absent Repetitive behaviors, motor, and/or verbal: Absent Rapid, loud, or excessive talking: Absent Sudden changes of mood: Absent Easily initiated or excessive crying and/or laughter: Absent Self-abusiveness, physical and/or verbal: Absent Agitated behavior scale total score: 16  Therapy/Group: Individual Therapy  Tamala Ser 10/23/2020, 1:58 PM

## 2020-10-24 NOTE — Progress Notes (Signed)
RT note- Called to patients room for moderate shortness of breath, nursing suctioned patient and also myself check for patency, good, trach secured a little, PMV placed back on patient along with ATC. Sp02 at this time 94-96%. Continue to monitor.

## 2020-10-24 NOTE — Progress Notes (Signed)
Occupational Therapy Session Note  Patient Details  Name: Hilberto Burzynski MRN: [id number] Date of Birth: [birth year]  Today's Date: 10/24/2020 OT Individual Time: 0092-3300 OT Individual Time Calculation (min): 55 min    Short Term Goals: Week 3:  OT Short Term Goal 1 (Week 3): LTG=STG 2/2 ELOS  Skilled Therapeutic Interventions/Progress Updates:    Treatment session with focus on functional mobility, endurance, and adherence to back precautions.  Pt received semi-reclined in bed with TLSO donned.  Pt declined bathing/dressing this session, but agreeable to completing grooming tasks at sink.  Pt completed bed mobility with CGA and ambulated to sink without AD with CGA.  Pt completed oral care and washing face while standing with close supervision to CGA.  Pt transported to Dayroom via w/c for energy conservation.  Engaged in horse shoe tossing in standing with CGA, while reaching outside BOS with RUE to challenge reaching and weight shifting.  Educated on back precautions and use of reacher to obtain items from floor while maintaining back precautions.  Engaged in obstacle course incorporating stepping over and around obstacles with CGA and no AD.  Pt on 5L FiO2 28% throughout session.  O2 sats 96% at rest and dropping to 90-93% during activity.  Pt returned to room and reports need to toilet.  Pt continent bowel and bladder on [facility] and supervision when completing toileting tasks.  Pt returned to recliner and left upright with all needs in reach and chair alarm on.  Therapy Documentation Precautions:  Precautions Precautions: Back Precaution Booklet Issued: Yes (comment) Precaution Comments: trach collar, TLSO don with rolling if HOB >30,  abdominal binder to protect PEG, c-collar d/c'ed 5/24 Required Braces or Orthoses: Spinal Brace Cervical Brace: Hard collar,At all times Spinal Brace: Thoracolumbosacral orthotic,Applied in supine position Restrictions Weight Bearing Restrictions:  No General:   Vital Signs: Therapy Vitals Pulse Rate: 74 Resp: 16 Patient Position (if appropriate): Lying Oxygen Therapy SpO2: 98 % O2 Flow Rate (L/min): 5 L/min FiO2 (%): 28 % Pain: Pt with c/o pain in R shoulder and L neck.  Declined pain intervention.   Therapy/Group: Individual Therapy  Rosalio Loud 10/24/2020, 10:52 AM

## 2020-10-24 NOTE — Progress Notes (Signed)
Occupational Therapy Session Note  Patient Details  Name: Charles Weeks MRN: [id number] Date of Birth: 1965-08-30  Today's Date: 10/24/2020 OT Individual Time: 0102-7253 OT Individual Time Calculation (min): 47 min    Short Term Goals: Week 1:  OT Short Term Goal 1 (Week 1): Pt will complete toileting tasks with min A OT Short Term Goal 1 - Progress (Week 1): Met OT Short Term Goal 2 (Week 1): Pt will transfer to toilet with min A OT Short Term Goal 2 - Progress (Week 1): Met OT Short Term Goal 3 (Week 1): Pt will instruct caregiver on donning TLSO with min cueing OT Short Term Goal 3 - Progress (Week 1): Progressing toward goal OT Short Term Goal 4 (Week 1): Pt will use LRAD to wash LB with min A OT Short Term Goal 4 - Progress (Week 1): Met Week 2:  OT Short Term Goal 1 (Week 2): Pt will instruct caregiver on donning TLSO with min cueing OT Short Term Goal 1 - Progress (Week 2): Progressing toward goal OT Short Term Goal 2 (Week 2): Patient will tolerate standing for 5 minutes in prep for BADL task OT Short Term Goal 2 - Progress (Week 2): Met OT Short Term Goal 3 (Week 2): Pt will use AE for LB dressing with min questioning cues OT Short Term Goal 3 - Progress (Week 2): Progressing toward goal Week 3:  OT Short Term Goal 1 (Week 3): LTG=STG 2/2 ELOS  Skilled Therapeutic Interventions/Progress Updates:    Pt received in room on 5L FiO2 via trach collar with sister present and consented to OT tx. Pt requested to cut his hair this session, instructed in grooming tasks while standing sink side. Pt required assistance for using clippers on R side, able to do L side as pt is L handed. Pt's HR increased to 115, O2 dropped to 80%, instructed to sit and rest and complete remainder of grooming activity seated for energy conservation. Pt required increased time for task and deep breathing techniques. Vitals monitored and stable for remainder of session. After grooming task, pt instructed in  orange theraband HEP to increase strength and activity tolerance for ADLs and functional transfers and mobility. Pt trained in tricep extension and shoulder internal and external rotation for 3x15 with min cuing for proper tech with good carryover. Pt required minimal rest breaks due to fatigue. After tx, pt left up in recliner with all VSS and LPN present, alarm on and all needs met.   Therapy Documentation Precautions:  Precautions Precautions: Back Precaution Booklet Issued: Yes (comment) Precaution Comments: trach collar, TLSO don with rolling if HOB >30,  abdominal binder to protect PEG, c-collar d/c'ed 5/24 Required Braces or Orthoses: Spinal Brace Cervical Brace: Hard collar,At all times Spinal Brace: Thoracolumbosacral orthotic,Applied in supine position Restrictions Weight Bearing Restrictions: No   Vital Signs: Therapy Vitals Temp: 98.4 F (36.9 C) Temp Source: Oral Pulse Rate: 96 Resp: 18 BP: 129/86 Patient Position (if appropriate): Sitting Oxygen Therapy SpO2: 94 % O2 Device: Tracheostomy Collar O2 Flow Rate (L/min): 5 L/min FiO2 (%): 28 % Pain: Pain Assessment Pain Scale: 0-10 Pain Score: 7  Faces Pain Scale: No hurt Pain Type: Acute pain Pain Location: Back Pain Orientation: Right;Upper Pain Radiating Towards: ride side Pain Descriptors / Indicators: Discomfort Pain Frequency: Constant Pain Onset: On-going Patients Stated Pain Goal: 3 Pain Intervention(s): Medication (See eMAR);Repositioned   Therapy/Group: Individual Therapy  Cyndia Degraff 10/24/2020, 1:33 PM

## 2020-10-24 NOTE — Progress Notes (Signed)
Physical Therapy Session Note  Patient Details  Name: Charles Weeks MRN: [id number] Date of Birth: August 22, 1965  Today's Date: 10/24/2020 PT Individual Time: 1435-1500 PT Individual Time Calculation (min): 25 min  Short Term Goals: Week 2:  PT Short Term Goal 1 (Week 2): STGs = LTGs  Skilled Therapeutic Interventions/Progress Updates:     Patient in recliner in the room with TLSO donned upon PT arrival. Patient alert and agreeable to PT session. Patient reported 8/10 R shoulder and back pain during session, RN made aware. PT provided repositioning, rest breaks, and distraction as pain interventions throughout session.   Focused session on R shoulder pain management, strengthening, and mobility along with breathing exercises to promote increased breath support with activity.  Vitals: Patient on 28% FiOs via trach collar throughout session, SPO2 >93%.   Therapeutic Activity: Transfers: Patient performed stand pivot recliner<>bed with supervision for safety. Provided verbal cues for maintaining flat back and hinging at the hips to maintain back precautions during transfers.  Therapeutic Exercise: Patient performed the following exercises with verbal and tactile cues for proper technique. -R AAROM shoulder flexion through pain free range x10 -R AAROM shoulder abduction through pain free range x10 -R shoulder IR/ER AROM x10 -B shoulder flexion with orange band and scapular retraction x10 -rows with orange band with scapular retraction 2x10 -B shoulder rolls forward/backward with emphasis on scapular motion -diaphragmatic breathing with scapular retraction/protraction 2x10 -diaphragmatic breathing with B shoulder flexion/extension 2x10  Patient in recliner in the room at end of session with breaks locked, chair alarm set, and all needs within reach.    Therapy Documentation Precautions:  Precautions Precautions: Back Precaution Booklet Issued: Yes (comment) Precaution Comments:  trach collar, TLSO don with rolling if HOB >30,  abdominal binder to protect PEG, c-collar d/c'ed 5/24 Required Braces or Orthoses: Spinal Brace Cervical Brace: Hard collar,At all times Spinal Brace: Thoracolumbosacral orthotic,Applied in supine position Restrictions Weight Bearing Restrictions: No    Therapy/Group: Individual Therapy  Odes Lolli L Denna Fryberger PT, DPT  10/24/2020, 6:37 AM

## 2020-10-24 NOTE — Progress Notes (Signed)
Speech Language Pathology Weekly Progress and Session Note  Patient Details  Name: Charles Weeks MRN: [id number] Date of Birth: 01/30/1966  Beginning of progress report period: October 17, 2020 End of progress report period: October 24, 2020  Today's Date: 10/24/2020 SLP Individual Time: 1000-1045 SLP Individual Time Calculation (min): 45 min  Short Term Goals: Week 2: SLP Short Term Goal 1 (Week 2): Patient will consume current diet with minimal overt s/s of aspiration and overall Mod I for use of swallowing compensatory strategies. SLP Short Term Goal 1 - Progress (Week 2): Met SLP Short Term Goal 2 (Week 2): Patient will demonstrate efficient mastiction and complete oral clearance with trials of regular textures over 2 sessions prior to upgrade with supervision level verbal cues. SLP Short Term Goal 2 - Progress (Week 2): Not met SLP Short Term Goal 3 (Week 2): Patient will utilize speech intelliigibility strategies at the sentence level to achieve ~90% intelligibility with supervision verbal cues. SLP Short Term Goal 3 - Progress (Week 2): Met SLP Short Term Goal 4 (Week 2): Patient will recall functional information with supervision level verbal cues for use of strategies. SLP Short Term Goal 4 - Progress (Week 2): Met    New Short Term Goals: Week 3: SLP Short Term Goal 1 (Week 3): STGs=LTGs due to ELOS  Weekly Progress Updates:  Patient demonstrates progress toward goals, and has successfully met 3/4 short-term goals. Tolerating PMSV during all waking hours with fluctuating vocal intensity impacting overall speech intelligibility. Overall, patient is perceived as ~85-90% intelligible with verbal reminders to utilize speech strategies and diaphragmatic breathing for improved breath support. Patient recently displayed decreased participation and motivation toward addressing communication goals, and refused to participate in voice exercises on 6/7 and 6/8. However, patient remains agreeable to  participate in swallowing and cognitive goals. Currently supervision level from a cognitive standpoint with reminders for short-term recall.Tolerating Dys 3 diet and thin liquids with minimal-to-no overt signs and symptoms of aspiration. Recommend trials of regular diet as tolerated. Continue with current plan for further improvement and generalization to maximize speech intelligibility, cognitive, and swallowing function.   Intensity: Minumum of 1-2 x/day, 30 to 90 minutes Frequency: 3 to 5 out of 7 days Duration/Length of Stay: 6/15 Treatment/Interventions: Cognitive remediation/compensation;Dysphagia/aspiration precaution training;Medication managment;Speech/Language facilitation;Patient/family education;Functional tasks   Daily Session  Skilled Therapeutic Interventions:   Skilled SLP treatment performed on this date with focus on speech and cognitive goals. Patient was sitting upright in chair on arrival with PMSV in place. All vitals remained WFL during session. SLP facilitated task with focus on alternating and focused attention, reasoning, and short-term recall using UNO, and BLINK cards. Patient completed with Min A verbal cues progressing to occasional verbal cues. Additionally completed verbal reasoning and problem solving task with Min A verbal cues. Patient was not agreeable to completing voice exercises, although receptive to reviewing postural strategies and breathing techniques. Patient was left in chair with alarm activated and all needs within reach. Continue current SLP plan of care.    Pain Pain Assessment Pain Scale: 0-10 Pain Score: 7  Faces Pain Scale: No hurt Pain Type: Acute pain Pain Location: Back Pain Orientation: Right;Upper Pain Radiating Towards: ride side Pain Descriptors / Indicators: Discomfort Pain Frequency: Constant Pain Onset: On-going Patients Stated Pain Goal: 3 Pain Intervention(s): Medication (See eMAR);Repositioned  Therapy/Group: Individual  Therapy   T  10/24/2020, 12:26 PM         

## 2020-10-24 NOTE — Plan of Care (Signed)
  Problem: RH SAFETY Goal: RH STG ADHERE TO SAFETY PRECAUTIONS W/ASSISTANCE/DEVICE Description: STG Adhere to Safety Precautions With cues/reminders Assistance/Device. Outcome: Progressing   Problem: RH KNOWLEDGE DEFICIT GENERAL Goal: RH STG INCREASE KNOWLEDGE OF SELF CARE AFTER HOSPITALIZATION Description: Patient will be able to manage care at discharge and/or direct caregivers how to assist independently using handouts and educational resources, family supervision/cues/reminders Outcome: Progressing

## 2020-10-24 NOTE — Progress Notes (Signed)
Pt c/o difficulty swallowing and breathing. SpO2 94. Nurse suctioned pt, no secretions. Nurse contacted RT while Charge nurse attempted suctioning pt, no secretions. RT arrived and assessed pt. PA updated. RT stated pt now stable SpO2 in 90's.

## 2020-10-24 NOTE — Progress Notes (Signed)
PROGRESS NOTE   Subjective/Complaints: Pt says he slept pretty well. Nursing note re: cbg reviewed.  Feels that breathing is ok at present  ROS: Patient denies fever, rash, sore throat, blurred vision, nausea, vomiting, diarrhea, cough,   chest pain, joint or back pain, headache, or mood change.   Objective:   No results found. No results for input(s): WBC, HGB, HCT, PLT in the last 72 hours. No results for input(s): NA, K, CL, CO2, GLUCOSE, BUN, CREATININE, CALCIUM in the last 72 hours.  Intake/Output Summary (Last 24 hours) at 10/24/2020 0837 Last data filed at 10/24/2020 0700 Gross per 24 hour  Intake 1637 ml  Output 2300 ml  Net -663 ml        Physical Exam: Vital Signs Blood pressure (!) 121/91, pulse 74, temperature 98.5 F (36.9 C), resp. rate 16, height 5\' 8"  (1.727 m), weight 81.9 kg, SpO2 98 %. Constitutional: No distress . Vital signs reviewed. HEENT: EOMI, oral membranes moist Neck: trach with PMV, raspy voice Cardiovascular: RRR without murmur. No JVD    Respiratory/Chest: CTA Bilaterally without wheezes or rales. Normal effort    GI/Abdomen: BS +, non-tender, sl-distended, mild PEG site drainage Ext: no clubbing, cyanosis, or edema Psych: pleasant and cooperative Skin: scattered abrasions. See above Neurologic: Cranial nerves II through XII intact, motor strength is 3- Right and 4 /5 Left  deltoid, 4/5 bilateral bicep, tricep, grip, hip flexor, knee extensors, ankle dorsiflexor and plantar flexor Normal sensory exam essentially  Musculoskeletal: some pain with PROM RIght shoulder abd/flex/IR/ER     Assessment/Plan: 1. Functional deficits which require 3+ hours per day of interdisciplinary therapy in a comprehensive inpatient rehab setting.  Physiatrist is providing close team supervision and 24 hour management of active medical problems listed below.  Physiatrist and rehab team continue to assess  barriers to discharge/monitor patient progress toward functional and medical goals  Care Tool:  Bathing    Body parts bathed by patient: Right arm,Left arm,Chest,Abdomen,Front perineal area,Buttocks,Right upper leg,Left upper leg,Face   Body parts bathed by helper: Left lower leg,Right lower leg     Bathing assist Assist Level: Moderate Assistance - Patient 50 - 74%     Upper Body Dressing/Undressing Upper body dressing   What is the patient wearing?: Orthosis (TLSO)    Upper body assist Assist Level: Total Assistance - Patient < 25%    Lower Body Dressing/Undressing Lower body dressing      What is the patient wearing?: Underwear/pull up,Pants     Lower body assist Assist for lower body dressing: Minimal Assistance - Patient > 75%     Toileting Toileting    Toileting assist Assist for toileting: Minimal Assistance - Patient > 75%     Transfers Chair/bed transfer  Transfers assist     Chair/bed transfer assist level: Contact Guard/Touching assist     Locomotion Ambulation   Ambulation assist      Assist level: Contact Guard/Touching assist Assistive device: No Device Max distance: 129ft   Walk 10 feet activity   Assist     Assist level: Contact Guard/Touching assist Assistive device: No Device   Walk 50 feet activity   Assist  Assist level: Contact Guard/Touching assist Assistive device: No Device    Walk 150 feet activity   Assist Walk 150 feet activity did not occur: Safety/medical concerns  Assist level: Contact Guard/Touching assist Assistive device: No Device    Walk 10 feet on uneven surface  activity   Assist Walk 10 feet on uneven surfaces activity did not occur: Safety/medical concerns         Wheelchair     Assist Will patient use wheelchair at discharge?: No             Wheelchair 50 feet with 2 turns activity    Assist            Wheelchair 150 feet activity     Assist           Blood pressure (!) 121/91, pulse 74, temperature 98.5 F (36.9 C), resp. rate 16, height 5\' 8"  (1.727 m), weight 81.9 kg, SpO2 98 %.    Medical Problem List and Plan: 1.Debilitysecondary to fall 08/17/20 with 3 column T8 vertebral body fracture/T7 neural arch fracture/right TVP T4-8/right C7 transverse process fracture through the foramen. CTO to be applied in supine position -patient may shower with CTO -ELOS/Goals:10/31/20- supervision to min A            -Continue CIR therapies including PT, OT, and SLP    2. Impaired mobility -DVT/anticoagulation:Continue Lovenox.               -vascular study negative -antiplatelet therapy: Aspirin 81 mg daily 3. Pain Management:Continue Oxycodone as needed             -?mild neuropathic pain RUE. ?C7 radic---continue gabapentin 100mg  tid---will dc, not sure this is helping   -could be  RTC/DJD also. Continue lidocaine patch    -continue with support, ROM for now. Seems tolerable 4. Mood:Continue Wellbutrin 300 mg daily, BuSpar 10 mg 3 times daily, Klonopin 0.5 mg nightly, -antipsychotic agents: Seroquel 150 mg twice daily and 300 mg nightly             -behavior has been appropriate 5. Neuropsych: This patientis notcapable of making decisions on hisown behalf. 6. Skin/Wound Care:Routine skin checks 7. Fluids/Electrolytes/Nutrition:               -recent lab work is within normal limits.             -good po intake 8. Acute blood loss anemia. Follow-up CBC 9. Multiple bilateral rib fractures. Conservative care 10. 3: T8 vertebral body fracture. Follow-up neurosurgery Dr. 11/02/20. Nonoperative.              -CTO for HOB>30 degrees at this point-  Injury 08/17/20  -nipple pain is from contact with CTO--- tape nipples, lanolin oil  11. T7 neural arch fracture. Follow-up neurosurgery 12. Right TVP T4-8. Pain control follow neurosurgery 13. Right C7 TVP fracture through foramen.  CTA negative. CTO 14. Large scalp laceration right eyebrow laceration. Repaired 08/17/2020. Sutures removed 15. Hypertension. Lopressor 25 mg twice daily 16. CAD/MI/DES. Continue aspirin 17. Acute hypercarbic ventilatory dependent respiratory failure/COPD. Tracheostomy 09/03/2020 per Dr. 10/17/2020. Changed back to #6 cuffless 10/05/2020 due to secretions. Patient will follow up long-term with Dr. Janee Morn           CBC CXR unremarkable   -5/31 back to 5L oxygen. Wbc's down yesterday. occ low grade temp, resp cx pend   -continue augmentin for now   -continue trach mgt, breathing treatments etc  6/8    Appreciate  ENT consult. 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 Probably will do as outpt once pt is out of CTO. F/u with NS regarding brace timing    -continue prednisone 5mg  bid, decrease to QD on friday   -continue above nebs, inhalers, O2   -appreciate PCCCM follow up   -continue #4 with PMV only        18. Dysphagia. Gastrostomy tube 09/03/2020 per Dr. 09/05/2020.              -Tolerating Dysphagia #3 thin liquids--advance per SLP 19. BPH. Continue Hytrin/Flomax/Urecholine---on max doses              -Large bladder volumes             -in/out caths q4-6 hours---still not voiding.             -oob to void for all attempts   6/3 increased flomax to bid  6/6 likely will need to go home with foley--don't I/O caths are feasible unless family can perform given his pain, CTO  6/8 minimal progress. Continue urecholine to QID 20. History of alcohol tobacco abuse. Provide counseling 21. Constipation---+bm 6/7    LOS: 15 days A FACE TO FACE EVALUATION WAS PERFORMED  8/7 10/24/2020, 8:37 AM

## 2020-10-24 NOTE — Progress Notes (Signed)
Physical Therapy Session Note  Patient Details  Name: Charles Weeks MRN: [id number] Date of Birth: 07-31-1965  Today's Date: 10/24/2020 PT Individual Time: 1132-1159 PT Individual Time Calculation (min): 27 min   Short Term Goals: Week 2:  PT Short Term Goal 1 (Week 2): STGs = LTGs  Skilled Therapeutic Interventions/Progress Updates:     Pt received seated in recliner and agrees to therapy. No complaint of pain. Pt on 5L 28% FiO2 via trach collar and O2 sats at 92%. Stand step transfer to [facility] with supervision and cues on positioning. WC transport to gym for time management. Pt performs NMR for standing balance and activity tolerance with Wii bowling game. Pt is able to remain standing >7 minutes without taking seated rest break. Pt progresses from performing dynamic, high amplitude movements with L upper extremity and feet stationary to performing movement while shifting weight and taking large step forward with R leg. PT provides CGA for dynamic movement. Pt left seated in recliner with alarm intact and all needs within reach.  Therapy Documentation Precautions:  Precautions Precautions: Back Precaution Booklet Issued: Yes (comment) Precaution Comments: trach collar, TLSO don with rolling if HOB >30,  abdominal binder to protect PEG, c-collar d/c'ed 5/24 Required Braces or Orthoses: Spinal Brace Cervical Brace: Hard collar,At all times Spinal Brace: Thoracolumbosacral orthotic,Applied in supine position Restrictions Weight Bearing Restrictions: No   Therapy/Group: Individual Therapy  Beau Fanny, PT, DPT 10/24/2020, 4:24 PM

## 2020-10-24 NOTE — Progress Notes (Signed)
Patient  refused to check BS this morning, he said" he is not diabetic".

## 2020-10-25 LAB — GLUCOSE, CAPILLARY: Glucose-Capillary: 143 mg/dL — ABNORMAL HIGH (ref 70–99)

## 2020-10-25 NOTE — Progress Notes (Signed)
Patient ID: Charles Weeks, male   DOB: 10/04/1965, 55 y.o.   MRN: [id number]  SW provided trach care form/portable sux/humidification form to [employer] liaison Oletha Cruel.   Cecile Sheerer, MSW, LCSWA Office: [phone] Cell: [phone] Fax: [phone]

## 2020-10-25 NOTE — Progress Notes (Signed)
Occupational Therapy Session Note  Patient Details  Name: Charles Weeks MRN: [id number] Date of Birth: 02/20/1966  Today's Date: 10/25/2020 OT Individual Time: 1030-1100 OT Individual Time Calculation (min): 30 min    Short Term Goals: Week 1:  OT Short Term Goal 1 (Week 1): Pt will complete toileting tasks with min A OT Short Term Goal 1 - Progress (Week 1): Met OT Short Term Goal 2 (Week 1): Pt will transfer to toilet with min A OT Short Term Goal 2 - Progress (Week 1): Met OT Short Term Goal 3 (Week 1): Pt will instruct caregiver on donning TLSO with min cueing OT Short Term Goal 3 - Progress (Week 1): Progressing toward goal OT Short Term Goal 4 (Week 1): Pt will use LRAD to wash LB with min A OT Short Term Goal 4 - Progress (Week 1): Met Week 2:  OT Short Term Goal 1 (Week 2): Pt will instruct caregiver on donning TLSO with min cueing OT Short Term Goal 1 - Progress (Week 2): Progressing toward goal OT Short Term Goal 2 (Week 2): Patient will tolerate standing for 5 minutes in prep for BADL task OT Short Term Goal 2 - Progress (Week 2): Met OT Short Term Goal 3 (Week 2): Pt will use AE for LB dressing with min questioning cues OT Short Term Goal 3 - Progress (Week 2): Progressing toward goal Week 3:  OT Short Term Goal 1 (Week 3): LTG=STG 2/2 ELOS     Skilled Therapeutic Interventions/Progress Updates:    Pt seen this session to focus on standing tolerance, endurance, and UE exercises with theraband.  His room O2 monitor kept showing low O2 sats but the portable dynamap was consistently 7% higher (showing pt was well above 90%). His RN replaced finger monitor and pt then was getting more accurate readings and tolerated standing for 2-3 minutes at a time, needing seated rest breaks in between. Pt resting EOB with all needs met and bed alarm set.   Therapy Documentation Precautions:  Precautions Precautions: Back Precaution Booklet Issued: Yes (comment) Precaution Comments:  trach collar, TLSO don with rolling if HOB >30,  abdominal binder to protect PEG, c-collar d/c'ed 5/24 Required Braces or Orthoses: Spinal Brace Cervical Brace: Hard collar, At all times Spinal Brace: Thoracolumbosacral orthotic, Applied in supine position Restrictions Weight Bearing Restrictions: No General:   Vital Signs: Therapy Vitals Resp: 17 Oxygen Therapy O2 Device: Tracheostomy Collar O2 Flow Rate (L/min): 5 L/min FiO2 (%): 28 % Pain:   ADL: ADL Eating: Supervision/safety Where Assessed-Eating: Bed level Grooming: Supervision/safety Where Assessed-Grooming: Bed level Upper Body Bathing: Minimal assistance Where Assessed-Upper Body Bathing: Bed level Lower Body Bathing: Moderate assistance Where Assessed-Lower Body Bathing: Edge of bed Upper Body Dressing: Not assessed Lower Body Dressing: Not assessed Toileting: Moderate assistance Where Assessed-Toileting: Toilet Toilet Transfer: Moderate assistance Toilet Transfer Method: Ambulating Tub/Shower Transfer: Unable to assess Walk-In Shower Transfer: Minimal assistance  Therapy/Group: Individual Therapy  , 10/25/2020, 8:31 AM 

## 2020-10-25 NOTE — Progress Notes (Signed)
PROGRESS NOTE   Subjective/Complaints: No issues sleeping. Frustrated that his voice is weak. Pain levels are stable  ROS: Patient denies fever, rash, sore throat, blurred vision, nausea, vomiting, diarrhea, cough,   chest pain, joint or back pain, headache, or mood change. .   Objective:   No results found. No results for input(s): WBC, HGB, HCT, PLT in the last 72 hours. No results for input(s): NA, K, CL, CO2, GLUCOSE, BUN, CREATININE, CALCIUM in the last 72 hours.  Intake/Output Summary (Last 24 hours) at 10/25/2020 1042 Last data filed at 10/25/2020 0941 Gross per 24 hour  Intake 1080 ml  Output 3350 ml  Net -2270 ml        Physical Exam: Vital Signs Blood pressure (!) 128/97, pulse 74, temperature 97.8 F (36.6 C), resp. rate 17, height 5\' 8"  (1.727 m), weight 81.9 kg, SpO2 99 %. Constitutional: No distress . Vital signs reviewed. HEENT: EOMI, oral membranes moist Neck: #6 trach, dysphonic with PMV Cardiovascular: RRR without murmur. No JVD    Respiratory/Chest: CTA Bilaterally without wheezes or rales. Normal effort    GI/Abdomen: BS +, non-tender, non-distended Ext: no clubbing, cyanosis, or edema Psych: flat but cooperative Skin: scattered abrasions. See above Neurologic:  alert, oriented. motor strength is 3- Right and 4 /5 Left  deltoid, 4/5 bilateral bicep, tricep, grip, hip flexor, knee extensors, ankle dorsiflexor and plantar flexor Normal sensory exam essentially  Musculoskeletal: some pain with PROM RIght shoulder abd/flex/IR/ER     Assessment/Plan: 1. Functional deficits which require 3+ hours per day of interdisciplinary therapy in a comprehensive inpatient rehab setting. Physiatrist is providing close team supervision and 24 hour management of active medical problems listed below. Physiatrist and rehab team continue to assess barriers to discharge/monitor patient progress toward functional and  medical goals  Care Tool:  Bathing    Body parts bathed by patient: Right arm, Left arm, Chest, Abdomen, Front perineal area, Buttocks, Right upper leg, Left upper leg, Face   Body parts bathed by helper: Left lower leg, Right lower leg     Bathing assist Assist Level: Moderate Assistance - Patient 50 - 74%     Upper Body Dressing/Undressing Upper body dressing   What is the patient wearing?: Orthosis (TLSO)    Upper body assist Assist Level: Total Assistance - Patient < 25%    Lower Body Dressing/Undressing Lower body dressing      What is the patient wearing?: Underwear/pull up, Pants     Lower body assist Assist for lower body dressing: Minimal Assistance - Patient > 75%     Toileting Toileting    Toileting assist Assist for toileting: Set up assist     Transfers Chair/bed transfer  Transfers assist     Chair/bed transfer assist level: Contact Guard/Touching assist     Locomotion Ambulation   Ambulation assist      Assist level: Contact Guard/Touching assist Assistive device: No Device Max distance: 173ft   Walk 10 feet activity   Assist     Assist level: Contact Guard/Touching assist Assistive device: No Device   Walk 50 feet activity   Assist    Assist level: Contact Guard/Touching assist Assistive device:  No Device    Walk 150 feet activity   Assist Walk 150 feet activity did not occur: Safety/medical concerns  Assist level: Contact Guard/Touching assist Assistive device: No Device    Walk 10 feet on uneven surface  activity   Assist Walk 10 feet on uneven surfaces activity did not occur: Safety/medical concerns         Wheelchair     Assist Will patient use wheelchair at discharge?: No             Wheelchair 50 feet with 2 turns activity    Assist            Wheelchair 150 feet activity     Assist          Blood pressure (!) 128/97, pulse 74, temperature 97.8 F (36.6 C), resp.  rate 17, height 5\' 8"  (1.727 m), weight 81.9 kg, SpO2 99 %.    Medical Problem List and Plan: 1.  Debility secondary to fall 08/17/20 with 3 column T8 vertebral body fracture/T7 neural arch fracture/right TVP T4-8/right C7 transverse process fracture through the foramen.  CTO to be applied in supine position             -patient may shower with CTO             -ELOS/Goals:10/31/20- supervision to min A            -Continue CIR therapies including PT, OT, and SLP    2.  Impaired mobility -DVT/anticoagulation: Continue Lovenox.                -vascular study negative             -antiplatelet therapy: Aspirin 81 mg daily 3. Pain Management: Continue Oxycodone as needed             -?mild neuropathic pain RUE. ?C7 radic---appears to be more RTC/DJD   -dc gabapentin   -Continue lidocaine patch   continue with support, ROM for now. Seems tolerable 4. Mood: Continue Wellbutrin 300 mg daily, BuSpar 10 mg 3 times daily, Klonopin 0.5 mg nightly,              -antipsychotic agents: Seroquel 150 mg twice daily and 300 mg nightly             -behavior has been appropriate 5. Neuropsych: This patient is not capable of making decisions on his own behalf. 6. Skin/Wound Care: Routine skin checks 7. Fluids/Electrolytes/Nutrition:                -recent lab work is within normal limits.             -good po intake/appetite 8.  Acute blood loss anemia.  Follow-up CBC 9.  Multiple bilateral rib fractures.  Conservative care 10.  3: T8 vertebral body fracture.  Follow-up neurosurgery Dr. 11/02/20.  Nonoperative.               -CTO for HOB>30 degrees at this point-  Injury 08/17/20  -nipple pain is from contact with CTO--- tape nipples, lanolin oil  11.  T7 neural arch fracture.  Follow-up neurosurgery 12.  Right TVP T4-8.  Pain control follow neurosurgery 13.  Right C7 TVP fracture through foramen.  CTA negative.  CTO 14.  Large scalp laceration right eyebrow laceration.  Repaired 08/17/2020.  Sutures removed 15.   Hypertension.  Lopressor 25 mg twice daily 16.  CAD/MI/DES.  Continue aspirin 17.  Acute hypercarbic ventilatory dependent respiratory failure/COPD.  Tracheostomy 09/03/2020 per Dr. Janee Morn.  Changed back to #6 cuffless 10/05/2020 due to secretions.  Patient will follow up long-term with Dr. Sherene Sires           CBC CXR unremarkable   -6/9   -off abx, on 5L O2   -continue trach mgt, breathing treatments etc    ENT consulted. 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 Probably will do as outpt once pt is out of CTO. F/u with NS regarding brace timing    -continue prednisone 5mg  bid, decrease to QD on friday   -continue above nebs, inhalers, O2   -appreciate [facility] follow up   -continue #4 with PMV only        18.  Dysphagia.  Gastrostomy tube 09/03/2020 per Dr. 09/05/2020.               -Tolerating Dysphagia #3 thin liquids--advance per SLP 19.  BPH.  Continue Hytrin/Flomax/Urecholine---on max doses              -Large bladder volumes             -in/out caths q4-6 hours---still not voiding.              -oob to void for all attempts   6/3 increased flomax to bid  6/6 likely will need to go home with foley--don't I/O caths are feasible unless family can perform given his pain, CTO  6/8 minimal progress. Continue urecholine to QID 20.  History of alcohol tobacco abuse.  Provide counseling 21. Constipation---+bm 6/7    LOS: 16 days A FACE TO FACE EVALUATION WAS PERFORMED  8/7 10/25/2020, 10:42 AM

## 2020-10-25 NOTE — Progress Notes (Signed)
SLP Cancellation Note  Patient Details Name: Charles Weeks MRN: [id number] DOB: 08-06-1965   Cancelled treatment:          Patient missed 30 minutes of skilled ST intervention due to refusal to participate. Upon arrival, patient was found in bed consuming breakfast without full supervision or TLSO brace on. HOB at 50 degrees. When therapist re-educated patient on importance of wearing TLSO greater than 30 degrees patient became increasingly frustrated and refused to cooperate and engage with therapist. Patient was eventually agreeable to putting on TLSO for safety, however, further declined to eat, reposition, or participate in further discussion and repeatedly stated "go". Patient was left in bed with alarms activated and all needs within reach. Nurse tech and Nurse were notified.                                                                                             Tamala Ser 10/25/2020, 9:21 AM

## 2020-10-25 NOTE — Progress Notes (Signed)
Physical Therapy Session Note  Patient Details  Name: Charles Weeks MRN: [id number] Date of Birth: 1966/04/25  Today's Date: 10/25/2020 PT Individual Time: 1105-1200 PT Individual Time Calculation (min): 55 min   Short Term Goals: Week 2:  PT Short Term Goal 1 (Week 2): STGs = LTGs  Skilled Therapeutic Interventions/Progress Updates:     Pt received seated at EOB and agrees to therapy. No complaint of pain. Stand step transfer to [facility] with supervision. WC transport to gym for time management. Pt performs NMR for standing balance and activity tolerance. Pt stands and tosses ball at trampoline and catches rebound to challenge dynamic balance and postural adjustments. Pt performs 2x20 standing on level ground, 1x20 with R foot on 4 inch step, 1x20 with L foot on 4 inch step, and 1x20 standing on airex mat to provide unreliable somatosensory input. PT provides CGA and pt takes extended rest breaks between each bout. Pt then performs multiple bouts of ambulation, 200', 320' and 320'. Pt on 5L O2 at 28% FiO2 on trach collar. Vitals assessed after each bout. HR typically around 130 bpm and O2 decreases to ~92%. Pt requires extended rest breaks to recover between bout. PT cues for slow, deep breathing to optimize oxygen sats and slow HR. WC transport back to room. Pt let seated in recliner with alarm intact and all needs within reach.  Therapy Documentation Precautions:  Precautions Precautions: Back Precaution Booklet Issued: Yes (comment) Precaution Comments: trach collar, TLSO don with rolling if HOB >30,  abdominal binder to protect PEG, c-collar d/c'ed 5/24 Required Braces or Orthoses: Spinal Brace Cervical Brace: Hard collar, At all times Spinal Brace: Thoracolumbosacral orthotic, Applied in supine position Restrictions Weight Bearing Restrictions: No   Therapy/Group: Individual Therapy  Beau Fanny, PT, DPT 10/25/2020, 4:20 PM

## 2020-10-25 NOTE — Progress Notes (Signed)
Occupational Therapy TBI Note  Patient Details  Name: Charles Weeks MRN: [id number] Date of Birth: 11-Aug-1965  Today's Date: 10/25/2020 OT Individual Time: 1407-1500 OT Individual Time Calculation (min): 53 min    Short Term Goals: Week 3:  OT Short Term Goal 1 (Week 3): LTG=STG 2/2 ELOS  Skilled Therapeutic Interventions/Progress Updates:   Met pt lying supine in bed, bed alarm on, pt agreed to OT tx. Pt reported pain on L side of body due to TLSO and requested to stay in bed for session. Treatment session was focused on self retraining and improving overall strength. Pt doffed shirt while lying supine. Pt was Min A for bathing at bed level. Pt was Mod A for UB dressing due to needed assistance to pull shirt down. Pt was supervision for rolling left and right in bed for self-care and dressing tasks. OT provided pt with orange theraband with grips added for better handling of band. Pt completed 3 x 10 of bicep curls, triceps press, straight arm raises, glute squeezes, straight leg raises and heel glides to increase strength needed for completion of ADLs. OT provided cues to decrease speed during exercises with good carryover from pt. OT adjusted TLOS and provided an extension to improve comfort. Left pt lying supine in bed, bed alarm on, all needs met.    Therapy Documentation Precautions:  Precautions Precautions: Back Precaution Booklet Issued: Yes (comment) Precaution Comments: trach collar, TLSO don with rolling if HOB >30,  abdominal binder to protect PEG, c-collar d/c'ed 5/24 Required Braces or Orthoses: Spinal Brace Cervical Brace: Hard collar, At all times Spinal Brace: Thoracolumbosacral orthotic, Applied in supine position Restrictions Weight Bearing Restrictions: No   Vital Signs: Therapy Vitals Temp: 98.1 F (36.7 C) Pulse Rate: 98 Resp: 16 BP: 120/89 Patient Position (if appropriate): Sitting Oxygen Therapy SpO2: 97 % O2 Device: Tracheostomy Collar O2 Flow Rate  (L/min): 5 L/min Pain: Pain Assessment Pain Scale: 0-10 Pain Score: 6  Pain Type: Acute pain Pain Location: Back Pain Orientation: Lower Pain Descriptors / Indicators: Aching Pain Frequency: Constant Pain Onset: On-going Patients Stated Pain Goal: 4 Pain Intervention(s): Medication (See eMAR) (oxycodone given) Agitated Behavior Scale: TBI Observation Details Observation Environment: patient room Start of observation period - Date: 10/25/20 Start of observation period - Time: 1408 End of observation period - Date: 10/25/20 End of observation period - Time: 1500 Agitated Behavior Scale (DO NOT LEAVE BLANKS) Impulsive, impatient, low tolerance for pain or frustration: Absent Uncooperative, resistant to care, demanding: Absent Violent and/or threatening violence toward people or property: Absent Explosive and/or unpredictable anger: Absent Rocking, rubbing, moaning, or other self-stimulating behavior: Absent Pulling at tubes, restraints, etc.: Absent Wandering from treatment areas: Absent Restlessness, pacing, excessive movement: Absent Repetitive behaviors, motor, and/or verbal: Absent Rapid, loud, or excessive talking: Absent Sudden changes of mood: Absent Easily initiated or excessive crying and/or laughter: Absent Self-abusiveness, physical and/or verbal: Absent    Therapy/Group: Individual Therapy  Bayard Males 10/25/2020, 3:33 PM

## 2020-10-25 NOTE — Progress Notes (Signed)
Occupational Therapy Session Note  Patient Details  Name: Charles Weeks MRN: [id number] Date of Birth: [birth year]  Today's Date: 10/25/2020 OT Individual Time: 0321-2248 OT Individual Time Calculation (min): 15 min  and Today's Date: 10/25/2020 OT Missed Time: 30 Minutes Missed Time Reason: Patient fatigue;Patient unwilling/refused to participate without medical reason   Short Term Goals: Week 1:  OT Short Term Goal 1 (Week 1): Pt will complete toileting tasks with min A OT Short Term Goal 1 - Progress (Week 1): Met OT Short Term Goal 2 (Week 1): Pt will transfer to toilet with min A OT Short Term Goal 2 - Progress (Week 1): Met OT Short Term Goal 3 (Week 1): Pt will instruct caregiver on donning TLSO with min cueing OT Short Term Goal 3 - Progress (Week 1): Progressing toward goal OT Short Term Goal 4 (Week 1): Pt will use LRAD to wash LB with min A OT Short Term Goal 4 - Progress (Week 1): Met Week 2:  OT Short Term Goal 1 (Week 2): Pt will instruct caregiver on donning TLSO with min cueing OT Short Term Goal 1 - Progress (Week 2): Progressing toward goal OT Short Term Goal 2 (Week 2): Patient will tolerate standing for 5 minutes in prep for BADL task OT Short Term Goal 2 - Progress (Week 2): Met OT Short Term Goal 3 (Week 2): Pt will use AE for LB dressing with min questioning cues OT Short Term Goal 3 - Progress (Week 2): Progressing toward goal Week 3:  OT Short Term Goal 1 (Week 3): LTG=STG 2/2 ELOS  Skilled Therapeutic Interventions/Progress Updates:    Pt greeted at time of scheduled OT session semireclined in bed w/ HOB at 34* and no brace. Pt aware that needs to have brace on "at a certain height" and agreeable to have HOB lowered to 30*. Pt adamantly declining therapy at this time stating he is very fatigued from previous sessions. Offered ADL, OOB activity, gym, etc but continued to decline. Pt requesting drink, checked with RN and pt is Full supervision even though pt denied,  educated that he is full supervision and provided this while pt drinking in room no issues. Provided hand out on energy conservation. Pt in bed resting, missed 30 mins of OT.   Therapy Documentation Precautions:  Precautions Precautions: Back Precaution Booklet Issued: Yes (comment) Precaution Comments: trach collar, TLSO don with rolling if HOB >30,  abdominal binder to protect PEG, c-collar d/c'ed 5/24 Required Braces or Orthoses: Spinal Brace Cervical Brace: Hard collar, At all times Spinal Brace: Thoracolumbosacral orthotic, Applied in supine position Restrictions Weight Bearing Restrictions: No     Therapy/Group: Individual Therapy  Viona Gilmore 10/25/2020, 2:45 PM

## 2020-10-26 LAB — GLUCOSE, CAPILLARY: Glucose-Capillary: 119 mg/dL — ABNORMAL HIGH (ref 70–99)

## 2020-10-26 MED ORDER — PREDNISONE 5 MG PO TABS
5.0000 mg | ORAL_TABLET | Freq: Every day | ORAL | Status: DC
  Administered 2020-10-27 – 2020-10-31 (×5): 5 mg via ORAL
  Filled 2020-10-26 (×5): qty 1

## 2020-10-26 NOTE — Progress Notes (Signed)
Occupational Therapy Session Note  Patient Details  Name: Charles Weeks MRN: [id number] Date of Birth: 1965/06/30  Today's Date: 10/26/2020 OT Individual Time: 1300-1413 OT Individual Time Calculation (min): 73 min    Short Term Goals: Week 3:  OT Short Term Goal 1 (Week 3): LTG=STG 2/2 ELOS  Skilled Therapeutic Interventions/Progress Updates:    Pt greeted seated in recliner and agreeable to OT treatment session. Pt placed on 5L on portable O2 throughout session. Pt brought outside for therapeutic exercises. Pt stood at picnic table and completed standing hip abduction, mini squats, and high knees, 4 sets of 10 with extended rest breaks in between. UB there-ex using orange theraband. 3 sets of 10 triceps press, bicep curl, and straight arm raises. Pt returned to inside and worked on standing balance/endurance standing on foam block for 3, 2 minute intervals with rest breaks in between. Pt returned to room and ambulated in room with CGA and no AD. Pt left seated in recliner with chair alarm on, call bell in reach, and needs met.   Therapy Documentation Precautions:  Precautions Precautions: Back Precaution Booklet Issued: Yes (comment) Precaution Comments: trach collar, TLSO don with rolling if HOB >30,  abdominal binder to protect PEG, c-collar d/c'ed 5/24 Required Braces or Orthoses: Spinal Brace Cervical Brace: Hard collar, At all times Spinal Brace: Thoracolumbosacral orthotic, Applied in supine position Restrictions Weight Bearing Restrictions: No Vital Signs: Therapy Vitals Pulse Rate: 90 Resp: 18 Patient Position (if appropriate): Lying Oxygen Therapy SpO2: 92 % O2 Device: Tracheostomy Collar O2 Flow Rate (L/min): 5 L/min FiO2 (%): 28 % Pain:  Denies pain   Therapy/Group: Individual Therapy  Valma Cava 10/26/2020, 2:14 PM

## 2020-10-26 NOTE — Progress Notes (Signed)
Physical Therapy Weekly Progress Note  Patient Details  Name: Charles Weeks MRN: [id number] Date of Birth: September 04, 1965  Beginning of progress report period: October 18, 2020 End of progress report period: October 26, 2020  Today's Date: 10/26/2020 PT Individual Time: 1586-8257 PT Individual Time Calculation (min): 55 min   Patient has met 0 of 0 short term goals. Pt's DC date set for longer than original ELOS, so short term goals not set for this reporting period. Pt is, howev making progress toward mobility goals, improving independence with bed mobility, balance, transfers, ambulation, and stair training. Pt's endurance deficit is likely largest limiting factor toward independent mobility, as pt has otherwise made good progress.  Patient continues to demonstrate the following deficits muscle weakness, decreased cardiorespiratoy endurance and decreased oxygen support, decreased awareness and decreased problem solving, and decreased standing balance and decreased balance strategies and therefore will continue to benefit from skilled PT intervention to increase functional independence with mobility.  Patient progressing toward long term goals..  Continue plan of care.  PT Short Term Goals Week 2:  PT Short Term Goal 1 (Week 2): STGs = LTGs Week 3:  PT Short Term Goal 1 (Week 3): STG=LTGs  Skilled Therapeutic Interventions/Progress Updates:  Ambulation/gait training;Community reintegration;DME/adaptive equipment instruction;Neuromuscular re-education;Psychosocial support;Stair training;UE/LE Strength taining/ROM;Wheelchair propulsion/positioning;Balance/vestibular training;Discharge planning;Functional electrical stimulation;Pain management;Skin care/wound management;Therapeutic Activities;UE/LE Coordination activities;Cognitive remediation/compensation;Disease management/prevention;Functional mobility training;Patient/family education;Splinting/orthotics;Therapeutic Exercise;Visual/perceptual  remediation/compensation   Pt received seated in recliner and agrees to therapy. No complaint of pain. Pt on 5L 28% FiO2 via tracheostomy's collar and O2 sats at 96% while in room. Pt performs stand step transfer to [facility] with cues on initiation and sequencing due to pt attempting to perform while still connected to safety net monitor. Pt voices that he is "tired of being here" and ready to go home. PT suggests going outside for session and pt is agreeable. WC transport outside for time management. Pt performs multiple bouts of ambulation outdoors with shoes on for practice ambulating very varying surfaces and grades in open environment. Pt ambulates x110, x290', and x250' with extended seated rest breaks. Pt requires primarily CGA but occasional minA due to slight LOBs, especially when walking on unloved surfaces. PT cues for increased arm swing to promote improved body mechanics, chest wall mobility, and balance. Cues also provided for upright gaze to improve posture and balance and increasing step height to prevent catching toes during swing phase. WC transport back to room. Stand step back to recliner with supervision. Left seated with alarm intact and all needs within reach.  Therapy Documentation Precautions:  Precautions Precautions: Back Precaution Booklet Issued: Yes (comment) Precaution Comments: trach collar, TLSO don with rolling if HOB >30,  abdominal binder to protect PEG, c-collar d/c'ed 5/24 Required Braces or Orthoses: Spinal Brace Cervical Brace: Hard collar, At all times Spinal Brace: Thoracolumbosacral orthotic, Applied in supine position Restrictions Weight Bearing Restrictions: No   Therapy/Group: Individual Therapy  Breck Coons, PT, DPT 10/26/2020, 4:18 PM

## 2020-10-26 NOTE — Progress Notes (Signed)
Patient ID: Charles Weeks, male   DOB: May 28, 1965, 55 y.o.   MRN: [id number]  SW spoke with pt sister [name] to confirm family edu on Wednesday (6/15) 1:30pm-3:30pm. SW informed RT will be here at 3:30pm to go through education. Confirms she will be here. SW informed on trach care kits/supplies, portable sux, humidification,and oxygen will be delivered prior to d/c.SW also informed on [facility] referral submitted.   SW confirmed with [employer] that DME will be delivered on Wednesday.   Cecile Sheerer, MSW, LCSWA Office: [phone] Cell: [phone] Fax: [phone]

## 2020-10-26 NOTE — Progress Notes (Signed)
Occupational Therapy TBI Note  Patient Details  Name: Yonah Tangeman MRN: [id number] Date of Birth: [birth year]  Today's Date: 10/26/2020 OT Individual Time: 2671-2458 OT Individual Time Calculation (min): 75 min    Short Term Goals: Week 1:  OT Short Term Goal 1 (Week 1): Pt will complete toileting tasks with min A OT Short Term Goal 1 - Progress (Week 1): Met OT Short Term Goal 2 (Week 1): Pt will transfer to toilet with min A OT Short Term Goal 2 - Progress (Week 1): Met OT Short Term Goal 3 (Week 1): Pt will instruct caregiver on donning TLSO with min cueing OT Short Term Goal 3 - Progress (Week 1): Progressing toward goal OT Short Term Goal 4 (Week 1): Pt will use LRAD to wash LB with min A OT Short Term Goal 4 - Progress (Week 1): Met  Skilled Therapeutic Interventions/Progress Updates:     Pt received in recliner with 7/10 premedicatd back pain. Rest and reposiitoning provided throughout  ADL:  Pt completes bathing of LB at sit to stand level with figure 4 to wash feet in w/c. Pt completes LB dressing with S overall at sit to stand with VC for crossing Les to thread BLE Pt completes footwear with S to don socks and shoes with firgure 4 position. OT installs elastic laces in shoes to decrease forward bend with shoe tying Pt completes toileting with CGA and no AD Pt completes toileting transfer with supervision stand pivot to [facility]   Therapeutic activity Pt completes 4 rounds of standing bean bag toss with VC for maintaining BLT precautions while reaching (no crossing midline) with regular stance and staggered stance with CGA for reaching in staggered stance. Greater LOB (MIN A to recover 1x) with RLE forward in staggered stance  Pt left at end of session in [facility] with NT in room finishing supervising BM   Therapy Documentation Precautions:  Precautions Precautions: Back Precaution Booklet Issued: Yes (comment) Precaution Comments: trach collar, TLSO don with rolling if  HOB >30,  abdominal binder to protect PEG, c-collar d/c'ed 5/24 Required Braces or Orthoses: Spinal Brace Cervical Brace: Hard collar, At all times Spinal Brace: Thoracolumbosacral orthotic, Applied in supine position Restrictions Weight Bearing Restrictions: No General:   Vital Signs: Therapy Vitals Pulse Rate: 90 Resp: 18 Patient Position (if appropriate): Lying Oxygen Therapy SpO2: 92 % O2 Device: Tracheostomy Collar O2 Flow Rate (L/min): 5 L/min FiO2 (%): 28 % Pain: Pain Assessment Pain Scale: 0-10 Pain Score: 7  Pain Type: Acute pain Pain Location: Back Pain Orientation: Lower Pain Intervention(s): Medication (See eMAR) Agitated Behavior Scale: TBI  Observation Details Observation Environment: CIR Start of observation period - Date: 10/26/20 Start of observation period - Time: 0930 End of observation period - Date: 10/26/20 End of observation period - Time: 1045 Agitated Behavior Scale (DO NOT LEAVE BLANKS) Short attention span, easy distractibility, inability to concentrate: Present to a slight degree Impulsive, impatient, low tolerance for pain or frustration: Absent Uncooperative, resistant to care, demanding: Absent Violent and/or threatening violence toward people or property: Absent Explosive and/or unpredictable anger: Absent Rocking, rubbing, moaning, or other self-stimulating behavior: Absent Pulling at tubes, restraints, etc.: Absent Wandering from treatment areas: Absent Restlessness, pacing, excessive movement: Absent Repetitive behaviors, motor, and/or verbal: Absent Rapid, loud, or excessive talking: Absent Sudden changes of mood: Absent Easily initiated or excessive crying and/or laughter: Absent Self-abusiveness, physical and/or verbal: Absent Agitated behavior scale total score: 15  ADL: ADL Eating: Supervision/safety Where Assessed-Eating: Bed level Grooming:  Supervision/safety Where Assessed-Grooming: Bed level Upper Body Bathing:  Minimal assistance Where Assessed-Upper Body Bathing: Bed level Lower Body Bathing: Moderate assistance Where Assessed-Lower Body Bathing: Edge of bed Upper Body Dressing: Not assessed Lower Body Dressing: Not assessed Toileting: Moderate assistance Where Assessed-Toileting: Glass blower/designer: Moderate assistance Toilet Transfer Method: Ambulating Tub/Shower Transfer: Unable to assess Social research officer, government: Minimal assistance Vision   Perception    Praxis   Exercises:   Other Treatments:     Therapy/Group: Individual Therapy  Tonny Branch 10/26/2020, 12:18 PM

## 2020-10-26 NOTE — Progress Notes (Signed)
PROGRESS NOTE   Subjective/Complaints: Had a pretty good night. Wants PEG out as it rubs against CTO.   ROS: Patient denies fever, rash, sore throat, blurred vision, nausea, vomiting, diarrhea, cough, shortness of breath or chest pain,  headache, or mood change.   Objective:   No results found. No results for input(s): WBC, HGB, HCT, PLT in the last 72 hours. No results for input(s): NA, K, CL, CO2, GLUCOSE, BUN, CREATININE, CALCIUM in the last 72 hours.  Intake/Output Summary (Last 24 hours) at 10/26/2020 1150 Last data filed at 10/26/2020 0840 Gross per 24 hour  Intake 960 ml  Output 3000 ml  Net -2040 ml        Physical Exam: Vital Signs Blood pressure 134/84, pulse 90, temperature 97.8 F (36.6 C), temperature source Oral, resp. rate 18, height 5\' 8"  (1.727 m), weight 81.9 kg, SpO2 92 %. Constitutional: No distress . Vital signs reviewed. HEENT: EOMI, oral membranes moist Neck: #6 trach with PMV, dysphonic Cardiovascular: RRR without murmur. No JVD    Respiratory/Chest: CTA Bilaterally without wheezes or rales. Normal effort. Good air movement    GI/Abdomen: BS +, non-tender, non-distended, PEG with sl drainage Ext: no clubbing, cyanosis, or edema Psych: pleasant and cooperative  Skin: scattered abrasions. See above Neurologic:  alert, oriented. motor strength is 3- Right and 4 /5 Left  deltoid, 4/5 bilateral bicep, tricep, grip, hip flexor, knee extensors, ankle dorsiflexor and plantar flexor. Motor stable. Sensory exam unremarkable  Musculoskeletal: some pain with PROM RIght shoulder abd/flex/IR/ER --no change   Assessment/Plan: 1. Functional deficits which require 3+ hours per day of interdisciplinary therapy in a comprehensive inpatient rehab setting. Physiatrist is providing close team supervision and 24 hour management of active medical problems listed below. Physiatrist and rehab team continue to assess  barriers to discharge/monitor patient progress toward functional and medical goals  Care Tool:  Bathing    Body parts bathed by patient: Chest, Left arm, Right arm, Abdomen   Body parts bathed by helper: Left lower leg, Right lower leg     Bathing assist Assist Level: Minimal Assistance - Patient > 75%     Upper Body Dressing/Undressing Upper body dressing   What is the patient wearing?: Pull over shirt, Orthosis    Upper body assist Assist Level: Moderate Assistance - Patient 50 - 74%    Lower Body Dressing/Undressing Lower body dressing      What is the patient wearing?: Underwear/pull up, Pants     Lower body assist Assist for lower body dressing: Minimal Assistance - Patient > 75%     Toileting Toileting    Toileting assist Assist for toileting: Set up assist     Transfers Chair/bed transfer  Transfers assist     Chair/bed transfer assist level: Contact Guard/Touching assist     Locomotion Ambulation   Ambulation assist      Assist level: Contact Guard/Touching assist Assistive device: No Device Max distance: 15ft   Walk 10 feet activity   Assist     Assist level: Contact Guard/Touching assist Assistive device: No Device   Walk 50 feet activity   Assist    Assist level: Contact Guard/Touching assist Assistive  device: No Device    Walk 150 feet activity   Assist Walk 150 feet activity did not occur: Safety/medical concerns  Assist level: Contact Guard/Touching assist Assistive device: No Device    Walk 10 feet on uneven surface  activity   Assist Walk 10 feet on uneven surfaces activity did not occur: Safety/medical concerns         Wheelchair     Assist Will patient use wheelchair at discharge?: No             Wheelchair 50 feet with 2 turns activity    Assist            Wheelchair 150 feet activity     Assist          Blood pressure 134/84, pulse 90, temperature 97.8 F (36.6 C),  temperature source Oral, resp. rate 18, height 5\' 8"  (1.727 m), weight 81.9 kg, SpO2 92 %.    Medical Problem List and Plan: 1.  Debility secondary to fall 08/17/20 with 3 column T8 vertebral body fracture/T7 neural arch fracture/right TVP T4-8/right C7 transverse process fracture through the foramen.  CTO to be applied in supine position             -patient may shower with CTO             -ELOS/Goals:10/31/20- supervision to min A            -Continue CIR therapies including PT, OT, and SLP     2.  Impaired mobility -DVT/anticoagulation: Continue Lovenox.                -vascular study negative             -antiplatelet therapy: Aspirin 81 mg daily 3. Pain Management: Continue Oxycodone as needed             -?mild neuropathic pain RUE. 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 Seems tolerable 4. Mood: Continue Wellbutrin 300 mg daily, BuSpar 10 mg 3 times daily, Klonopin 0.5 mg nightly,              -antipsychotic agents: Seroquel 150 mg twice daily and 300 mg nightly             -behavior has been appropriate 5. Neuropsych: This patient is not capable of making decisions on his own behalf. 6. Skin/Wound Care: Routine skin checks 7. Fluids/Electrolytes/Nutrition:                -recent lab work is within normal limits.             -has good appetite 8.  Acute blood loss anemia.  Follow-up CBC 9.  Multiple bilateral rib fractures.  Conservative care 10.  3: T8 vertebral body fracture.  Follow-up neurosurgery Dr. 11/02/20.  Nonoperative.               -CTO for HOB>30 degrees at this point-  Injury 08/17/20  -nipple pain is from contact with CTO--- tape nipples, lanolin oil  11.  T7 neural arch fracture.  Follow-up neurosurgery 12.  Right TVP T4-8.  Pain control follow neurosurgery 13.  Right C7 TVP fracture through foramen.  CTA negative.  CTO 14.  Large scalp laceration right eyebrow laceration.  Repaired  08/17/2020.  Sutures removed 15.  Hypertension.  Lopressor 25 mg twice daily 16.  CAD/MI/DES.  Continue aspirin 17.  Acute hypercarbic  ventilatory dependent respiratory failure/COPD.  Tracheostomy 09/03/2020 per Dr. Janee Morn.  Changed back to #6 cuffless 10/05/2020 due to secretions.  Patient will follow up long-term with Dr. Sherene Sires           CBC CXR unremarkable   -6/10   -off abx, on 5L O2   -continue trach mgt, breathing treatments etc    ENT consulted. 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 Probably will do as outpt once pt is out of CTO. F/u with NS regarding brace timing    -decrease prednisone to 5mg  qd today   -continue above nebs, inhalers, O2   -appreciate PCCCM follow up   -continue #6 with PMV only        18.  Dysphagia.  Gastrostomy tube 09/03/2020 per Dr. 09/05/2020.               -Tolerating Dysphagia #3 thin liquids--advance per SLP  6/10 recommended that pt keep in PEG given potential tracheal/pulmonary interventions after discharge 19.  BPH/urine retention:              -oob to void for all attempts   6/10 pt on bid flomax, 50mg  qid urecholine as well as hytrin   -almost always having to be cathed   -pt will need to decide if he wants his wife to catheterize him at home vs placement of foley.    -will request nurses begin education re caths   -regardless he will need outpt urology consult 20.  History of alcohol tobacco abuse.  Provide counseling 21. Constipation---+bm 6/7    LOS: 17 days A FACE TO FACE EVALUATION WAS PERFORMED  10/26/2020, 11:50 AM

## 2020-10-27 LAB — GLUCOSE, CAPILLARY: Glucose-Capillary: 128 mg/dL — ABNORMAL HIGH (ref 70–99)

## 2020-10-27 MED ORDER — IPRATROPIUM-ALBUTEROL 0.5-2.5 (3) MG/3ML IN SOLN
3.0000 mL | Freq: Two times a day (BID) | RESPIRATORY_TRACT | Status: DC
  Administered 2020-10-27 – 2020-10-28 (×3): 3 mL via RESPIRATORY_TRACT
  Filled 2020-10-27 (×2): qty 3

## 2020-10-27 NOTE — Progress Notes (Signed)
Occupational Therapy Session Note  Patient Details  Name: Charles Weeks MRN: [id number] Date of Birth: [birth year]  Today's Date: 10/27/2020 OT Individual Time: 1003-1016 OT Individual Time Calculation (min): 13 min  and Today's Date: 10/27/2020 OT Missed Time: 36 Minutes Missed Time Reason: Patient fatigue  Short Term Goals: Week 3:  OT Short Term Goal 1 (Week 3): LTG=STG 2/2 ELOS  Skilled Therapeutic Interventions/Progress Updates:    Pt greeted semi-reclined in bed. OT educated nursing on how to don/doff TLSO brace. Discussed setting brace out to orient it prior to donning. Pt rolled in bed with supervision while nursing donned brace for practice. Discussed how to ensure proper fit prior to riasing [facility] or transferring OOB. OT then encouraged pt to get OOB. Pt reported he did not sleep last night and was too tired to participate in OT. OT educated on importance of mobility and getting OOB, but he declined. OT gave other options for participation, but he continued to decline. Pt rolled with supervision while OT assisted with doffing brace. Pt left semi-reclined in bed with needs met and bed alarm on.   Therapy Documentation Precautions:  Precautions Precautions: Back Precaution Booklet Issued: Yes (comment) Precaution Comments: trach collar, TLSO don with rolling if HOB >30,  abdominal binder to protect PEG, c-collar d/c'ed 5/24 Required Braces or Orthoses: Spinal Brace Cervical Brace: Hard collar, At all times Spinal Brace: Thoracolumbosacral orthotic, Applied in supine position Restrictions Weight Bearing Restrictions: No General: General OT Amount of Missed Time: 47 Minutes Vital Signs: Therapy Vitals Temp: 98.4 F (36.9 C) Temp Source: Oral Pulse Rate: 94 Resp: 16 BP: 128/84 Patient Position (if appropriate): Sitting Oxygen Therapy SpO2: 92 % O2 Device: Tracheostomy Collar O2 Flow Rate (L/min): 5 L/min FiO2 (%): 28 % Pain: Pain Assessment Pain Score: 4  Back pain,  repositioned for comfort   Therapy/Group: Individual Therapy  Valma Cava 10/27/2020, 12:58 PM

## 2020-10-27 NOTE — Progress Notes (Signed)
PROGRESS NOTE   Subjective/Complaints:   Pt reports tired- woke up a lot by nursing.  LBM yesterday  Still has trach-    ROS:  Pt denies SOB, abd pain, CP, N/V/C/D, and vision changes   Objective:   No results found. No results for input(s): WBC, HGB, HCT, PLT in the last 72 hours. No results for input(s): NA, K, CL, CO2, GLUCOSE, BUN, CREATININE, CALCIUM in the last 72 hours.  Intake/Output Summary (Last 24 hours) at 10/27/2020 1528 Last data filed at 10/27/2020 1246 Gross per 24 hour  Intake 1200 ml  Output 1725 ml  Net -525 ml        Physical Exam: Vital Signs Blood pressure 128/84, pulse 97, temperature 98.4 F (36.9 C), temperature source Oral, resp. rate 18, height 5\' 8"  (1.727 m), weight 81.9 kg, SpO2 97 %.    General: awake, alert, appropriate, sitting EOB with CTO in place;  NAD HENT: conjugate gaze; oropharynx moist; TC in place #6 shiley- PMV in place; Ox2 via TC CV: regular rate; no JVD Pulmonary: CTA B/L; no W/R/R- good air movement GI: soft, NT, ND, (+)BS Psychiatric: appropriate; but irritable Neurological: alert   Skin: scattered abrasions. See above Neurologic:  alert, oriented. motor strength is 3- Right and 4 /5 Left  deltoid, 4/5 bilateral bicep, tricep, grip, hip flexor, knee extensors, ankle dorsiflexor and plantar flexor. Motor stable. Sensory exam unremarkable  Musculoskeletal: some pain with PROM RIght shoulder abd/flex/IR/ER --no change   Assessment/Plan: 1. Functional deficits which require 3+ hours per day of interdisciplinary therapy in a comprehensive inpatient rehab setting. Physiatrist is providing close team supervision and 24 hour management of active medical problems listed below. Physiatrist and rehab team continue to assess barriers to discharge/monitor patient progress toward functional and medical goals  Care Tool:  Bathing    Body parts bathed by patient: Chest,  Left arm, Right arm, Abdomen   Body parts bathed by helper: Left lower leg, Right lower leg     Bathing assist Assist Level: Minimal Assistance - Patient > 75%     Upper Body Dressing/Undressing Upper body dressing   What is the patient wearing?: Pull over shirt, Orthosis    Upper body assist Assist Level: Moderate Assistance - Patient 50 - 74%    Lower Body Dressing/Undressing Lower body dressing      What is the patient wearing?: Underwear/pull up, Pants     Lower body assist Assist for lower body dressing: Minimal Assistance - Patient > 75%     Toileting Toileting    Toileting assist Assist for toileting: Set up assist     Transfers Chair/bed transfer  Transfers assist     Chair/bed transfer assist level: Contact Guard/Touching assist     Locomotion Ambulation   Ambulation assist      Assist level: Contact Guard/Touching assist Assistive device: No Device Max distance: 188ft   Walk 10 feet activity   Assist     Assist level: Contact Guard/Touching assist Assistive device: No Device   Walk 50 feet activity   Assist    Assist level: Contact Guard/Touching assist Assistive device: No Device    Walk 150 feet activity  Assist Walk 150 feet activity did not occur: Safety/medical concerns  Assist level: Contact Guard/Touching assist Assistive device: No Device    Walk 10 feet on uneven surface  activity   Assist Walk 10 feet on uneven surfaces activity did not occur: Safety/medical concerns         Wheelchair     Assist Will patient use wheelchair at discharge?: No             Wheelchair 50 feet with 2 turns activity    Assist            Wheelchair 150 feet activity     Assist          Blood pressure 128/84, pulse 97, temperature 98.4 F (36.9 C), temperature source Oral, resp. rate 18, height 5\' 8"  (1.727 m), weight 81.9 kg, SpO2 97 %.    Medical Problem List and Plan: 1.  Debility secondary  to fall 08/17/20 with 3 column T8 vertebral body fracture/T7 neural arch fracture/right TVP T4-8/right C7 transverse process fracture through the foramen.  CTO to be applied in supine position             -patient may shower with CTO             -ELOS/Goals:10/31/20- supervision to min A            -Con't PT, OT and SLP     2.  Impaired mobility -DVT/anticoagulation: Continue Lovenox.                -vascular study negative             -antiplatelet therapy: Aspirin 81 mg daily 3. Pain Management: Continue Oxycodone as needed             -?mild neuropathic pain RUE. 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 Seems tolerable  -6/11- denies pain- con't regimen 4. Mood: Continue Wellbutrin 300 mg daily, BuSpar 10 mg 3 times daily, Klonopin 0.5 mg nightly,              -antipsychotic agents: Seroquel 150 mg twice daily and 300 mg nightly             -behavior has been appropriate 5. Neuropsych: This patient is not capable of making decisions on his own behalf. 6. Skin/Wound Care: Routine skin checks 7. Fluids/Electrolytes/Nutrition:                -recent lab work is within normal limits.             -has good appetite 8.  Acute blood loss anemia.  Follow-up CBC 9.  Multiple bilateral rib fractures.  Conservative care 10.  3: T8 vertebral body fracture.  Follow-up neurosurgery Dr. 8/11.  Nonoperative.               -CTO for HOB>30 degrees at this point-  Injury 08/17/20  -nipple pain is from contact with CTO--- tape nipples, lanolin oil  11.  T7 neural arch fracture.  Follow-up neurosurgery 12.  Right TVP T4-8.  Pain control follow neurosurgery 13.  Right C7 TVP fracture through foramen.  CTA negative.  CTO 14.  Large scalp laceration right eyebrow laceration.  Repaired 08/17/2020.  Sutures removed 15.  Hypertension.  Lopressor 25 mg twice daily 16.  CAD/MI/DES.  Continue aspirin 17.  Acute hypercarbic  ventilatory dependent respiratory failure/COPD.  Tracheostomy 09/03/2020 per Dr.  Janee Morn.  Changed back to #6 cuffless 10/05/2020 due to secretions.  Patient will follow up long-term with Dr. Sherene Sires           CBC CXR unremarkable   -6/10   -off abx, on 5L O2   -continue trach mgt, breathing treatments etc    ENT consulted. 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 Probably will do as outpt once pt is out of CTO. F/u with NS regarding brace timing    -decrease prednisone to 5mg  qd today   -continue above nebs, inhalers, O2   -appreciate PCCCM follow up   -continue #6 with PMV only  6/11- no change since yesterday- con't #6 Shiley and PMV- will not cap pt.         18.  Dysphagia.  Gastrostomy tube 09/03/2020 per Dr. 09/05/2020.               -Tolerating Dysphagia #3 thin liquids--advance per SLP  6/10 recommended that pt keep in PEG given potential tracheal/pulmonary interventions after discharge 19.  BPH/urine retention:              -oob to void for all attempts   6/10 pt on bid flomax, 50mg  qid urecholine as well as hytrin   -almost always having to be cathed   -pt will need to decide if he wants his wife to catheterize him at home vs placement of foley.    -will request nurses begin education re caths   -regardless he will need outpt urology consult 20.  History of alcohol tobacco abuse.  Provide counseling 21. Constipation---+  6/11- LBM yesterday- documented in chart- con't to monitor    LOS: 18 days A FACE TO FACE EVALUATION WAS PERFORMED  Dino Borntreger 10/27/2020, 3:28 PM

## 2020-10-27 NOTE — Progress Notes (Signed)
Speech Language Pathology Daily Session Note  Patient Details  Name: Charles Weeks MRN: [id number] Date of Birth: May 08, 1966  Today's Date: 10/27/2020 SLP Individual Time: 1345-1400 SLP Individual Time Calculation (min): 15 min and Today's Date: 10/27/2020 SLP Missed Time: 31 Minutes Missed Time Reason: Patient fatigue  Short Term Goals: Week 3: SLP Short Term Goal 1 (Week 3): STGs=LTGs due to ELOS  Skilled Therapeutic Interventions: Pt seen for skilled ST with focus on cognitive communication goals. Pt denying any PO trials at this time, states he didn't sleep well last night and wants SLP "to come back tomorrow". Pt encouraged to utilize strategies to increase intelligibility, including diaphragmatic breathing. Pt not able to consistently utilize strategies, intelligibility ~25% with decreased motivation to self correct. Pt requesting to rest at this time despite encouragement to continue skilled ST tx for cognition, speech and swallow goals. Pt continues to decline meaningful participation. Pt left semi-reclined in bed with alarm set and all needs within reach. Cont ST POC.   Pain Pain Assessment Pain Scale: 0-10 Pain Score: 0-No pain  Therapy/Group: Individual Therapy  Tacey Ruiz 10/27/2020, 2:07 PM

## 2020-10-28 LAB — GLUCOSE, CAPILLARY: Glucose-Capillary: 123 mg/dL — ABNORMAL HIGH (ref 70–99)

## 2020-10-28 NOTE — Progress Notes (Signed)
PROGRESS NOTE   Subjective/Complaints:   Pt reports breathing "so-so"- frustrated cannot get trach/PEG removed.   Bowels doing well per pt.   ROS:   Pt denies SOB, abd pain, CP, N/V/C/D, and vision changes   Objective:   No results found. No results for input(s): WBC, HGB, HCT, PLT in the last 72 hours. No results for input(s): NA, K, CL, CO2, GLUCOSE, BUN, CREATININE, CALCIUM in the last 72 hours.  Intake/Output Summary (Last 24 hours) at 10/28/2020 1202 Last data filed at 10/28/2020 1125 Gross per 24 hour  Intake 1200 ml  Output 2560 ml  Net -1360 ml        Physical Exam: Vital Signs Blood pressure 138/85, pulse 91, temperature 98.5 F (36.9 C), temperature source Oral, resp. rate 16, height 5\' 8"  (1.727 m), weight 81.9 kg, SpO2 97 %.     General: awake, alert, appropriate, wearing thoracic brace;  NAD HENT: conjugate gaze; oropharynx moist; has Trach #6 shiley with PMV this AM CV: regular rate; no JVD Pulmonary: slightly coarse, but good air movement- no W/R/R GI: soft, NT, ND, (+)BS; PEG(+) Psychiatric: appropriate; but irritable Neurological: alert Skin: scattered abrasions. See above Neurologic:  alert, oriented. motor strength is 3- Right and 4 /5 Left  deltoid, 4/5 bilateral bicep, tricep, grip, hip flexor, knee extensors, ankle dorsiflexor and plantar flexor. Motor stable. Sensory exam unremarkable  Musculoskeletal: some pain with PROM RIght shoulder abd/flex/IR/ER --no change   Assessment/Plan: 1. Functional deficits which require 3+ hours per day of interdisciplinary therapy in a comprehensive inpatient rehab setting. Physiatrist is providing close team supervision and 24 hour management of active medical problems listed below. Physiatrist and rehab team continue to assess barriers to discharge/monitor patient progress toward functional and medical goals  Care Tool:  Bathing    Body parts  bathed by patient: Chest, Left arm, Right arm, Abdomen   Body parts bathed by helper: Left lower leg, Right lower leg     Bathing assist Assist Level: Minimal Assistance - Patient > 75%     Upper Body Dressing/Undressing Upper body dressing   What is the patient wearing?: Pull over shirt, Orthosis    Upper body assist Assist Level: Moderate Assistance - Patient 50 - 74%    Lower Body Dressing/Undressing Lower body dressing      What is the patient wearing?: Underwear/pull up, Pants     Lower body assist Assist for lower body dressing: Minimal Assistance - Patient > 75%     Toileting Toileting    Toileting assist Assist for toileting: Set up assist     Transfers Chair/bed transfer  Transfers assist     Chair/bed transfer assist level: Contact Guard/Touching assist     Locomotion Ambulation   Ambulation assist      Assist level: Contact Guard/Touching assist Assistive device: No Device Max distance: 140ft   Walk 10 feet activity   Assist     Assist level: Contact Guard/Touching assist Assistive device: No Device   Walk 50 feet activity   Assist    Assist level: Contact Guard/Touching assist Assistive device: No Device    Walk 150 feet activity   Assist Walk 150 feet  activity did not occur: Safety/medical concerns  Assist level: Contact Guard/Touching assist Assistive device: No Device    Walk 10 feet on uneven surface  activity   Assist Walk 10 feet on uneven surfaces activity did not occur: Safety/medical concerns         Wheelchair     Assist Will patient use wheelchair at discharge?: No             Wheelchair 50 feet with 2 turns activity    Assist            Wheelchair 150 feet activity     Assist          Blood pressure 138/85, pulse 91, temperature 98.5 F (36.9 C), temperature source Oral, resp. rate 16, height 5\' 8"  (1.727 m), weight 81.9 kg, SpO2 97 %.    Medical Problem List and  Plan: 1.  Debility secondary to fall 08/17/20 with 3 column T8 vertebral body fracture/T7 neural arch fracture/right TVP T4-8/right C7 transverse process fracture through the foramen.  CTO to be applied in supine position             -patient may shower with CTO             -ELOS/Goals:10/31/20- supervision to min A            -con't PT, OT, and SLP/CIR   2.  Impaired mobility -DVT/anticoagulation: Continue Lovenox.                -vascular study negative             -antiplatelet therapy: Aspirin 81 mg daily 3. Pain Management: Continue Oxycodone as needed             -?mild neuropathic pain RUE. 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 Seems tolerable  -6/11-6/12- - denies pain- con't regimen- no change 4. Mood: Continue Wellbutrin 300 mg daily, BuSpar 10 mg 3 times daily, Klonopin 0.5 mg nightly,              -antipsychotic agents: Seroquel 150 mg twice daily and 300 mg nightly             -behavior has been appropriate 5. Neuropsych: This patient is not capable of making decisions on his own behalf. 6. Skin/Wound Care: Routine skin checks 7. Fluids/Electrolytes/Nutrition:                -recent lab work is within normal limits.             -has good appetite 8.  Acute blood loss anemia.  Follow-up CBC 9.  Multiple bilateral rib fractures.  Conservative care 10.  3: T8 vertebral body fracture.  Follow-up neurosurgery Dr. 09-01-1977.  Nonoperative.               -CTO for HOB>30 degrees at this point-  Injury 08/17/20  -nipple pain is from contact with CTO--- tape nipples, lanolin oil   6/12- stable with therapy- con't regimen 11.  T7 neural arch fracture.  Follow-up neurosurgery 12.  Right TVP T4-8.  Pain control follow neurosurgery 13.  Right C7 TVP fracture through foramen.  CTA negative.  CTO 14.  Large scalp laceration right eyebrow laceration.  Repaired 08/17/2020.  Sutures removed 15.  Hypertension.   Lopressor 25 mg twice daily 16.  CAD/MI/DES.  Continue aspirin 17.  Acute hypercarbic ventilatory dependent respiratory failure/COPD.  Tracheostomy 09/03/2020 per Dr. Janee Morn.  Changed back to #6 cuffless 10/05/2020 due to secretions.  Patient will follow up long-term with Dr. Sherene Sires           CBC CXR unremarkable   -6/10   -off abx, on 5L O2   -continue trach mgt, breathing treatments etc    ENT consulted. 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 Probably will do as outpt once pt is out of CTO. F/u with NS regarding brace timing    -decrease prednisone to 5mg  qd today   -continue above nebs, inhalers, O2   -appreciate [facility] follow up   -continue #6 with PMV only  6/11- no change since yesterday- con't #6 Shiley and PMV- will not cap pt.   6/12- sounds medically OK- pt frustrated cannot get it removed, but re-explained why- con't regimen    18.  Dysphagia.  Gastrostomy tube 09/03/2020 per Dr. 09/05/2020.               -Tolerating Dysphagia #3 thin liquids--advance per SLP  6/10 recommended that pt keep in PEG given potential tracheal/pulmonary interventions after discharge 19.  BPH/urine retention:              -oob to void for all attempts   6/10 pt on bid flomax, 50mg  qid urecholine as well as hytrin   -almost always having to be cathed   -pt will need to decide if he wants his wife to catheterize him at home vs placement of foley.    -will request nurses begin education re caths   -regardless he will need outpt urology consult 20.  History of alcohol tobacco abuse.  Provide counseling 21. Constipation---+  6/11- LBM yesterday- documented in chart- con't to monitor    LOS: 19 days A FACE TO FACE EVALUATION WAS PERFORMED  Charles Weeks 10/28/2020, 12:02 PM

## 2020-10-29 ENCOUNTER — Inpatient Hospital Stay (HOSPITAL_COMMUNITY): Payer: Medicaid Other

## 2020-10-29 DIAGNOSIS — J9601 Acute respiratory failure with hypoxia: Secondary | ICD-10-CM

## 2020-10-29 DIAGNOSIS — J386 Stenosis of larynx: Secondary | ICD-10-CM

## 2020-10-29 LAB — GLUCOSE, CAPILLARY: Glucose-Capillary: 153 mg/dL — ABNORMAL HIGH (ref 70–99)

## 2020-10-29 NOTE — Discharge Summary (Signed)
Physician Discharge Summary  Patient ID: Charles Weeks MRN: [id number] DOB/AGE: May 04, 1966 55 y.o.  Admit date: 10/09/2020 Discharge date: 10/31/2020  Discharge Diagnoses:  Principal Problem:   Thoracic spine fracture [facility]) Active Problems:   Urinary retention due to benign prostatic hyperplasia   Tracheostomy in place [facility])   Depression with anxiety   Subglottic stenosis DVT prophylaxis Acute blood loss anemia Multiple rib fractures T7 neural arch fracture Right TVP T4-8 Right C7 TVP fracture through foramen Large scalp laceration right eyebrow laceration Hypertension CAD/MI/DES Dysphagia/gastrostomy tube/ BPH History of alcohol tobacco use Constipation COPD Subglottic stenosis Nodular opacity apical segment right lower lobe  Discharged Condition: Stable  Significant Diagnostic Studies: DG Chest 2 View  Result Date: 10/14/2020 CLINICAL DATA:  Cough EXAM: CHEST - 2 VIEW COMPARISON:  Oct 03, 2020, Oct 08, 2020 FINDINGS: The cardiomediastinal silhouette is unchanged in contour.Tracheostomy. Small bilateral pleural effusions. No pneumothorax. Mild diffuse interstitial prominence and peribronchial cuffing. G-tube. Multilevel degenerative changes of the thoracic spine with previously described compression fracture deformities of the lower thoracic spine better evaluated on recent CT. IMPRESSION: 1. Small bilateral pleural effusions. Diffuse interstitial prominence and peribronchial cuffing likely reflects underlying emphysema. Differential considerations include mild pulmonary edema. Electronically Signed   By: Meda Klinefelter MD   On: 10/14/2020 11:22   DG Cervical Spine With Flex & Extend  Result Date: 10/07/2020 CLINICAL DATA:  Fall EXAM: CERVICAL SPINE COMPLETE WITH FLEXION AND EXTENSION VIEWS COMPARISON:  09/11/2020 FINDINGS: Limited study no instability with flexion or extension. Slight anterolisthesis of C3 on C4 likely did to facet disease. Prevertebral soft tissues are  normal. No visible fracture through the C6 level. C7 not visualized due to overlying shoulders. IMPRESSION: No visible instability. Limited lateral view study. C7 not visualized due to overlying shoulders. Electronically Signed   By: Charlett Nose M.D.   On: 10/07/2020 14:19   DG Thoracic Spine 2 View  Result Date: 10/08/2020 CLINICAL DATA:  T8 fracture. EXAM: THORACIC SPINE 2 VIEWS COMPARISON:  08/25/2020 and CT thoracic spine 09/11/2020. FINDINGS: Dextroconvex curvature of the thoracic spine. T8 through T12 compression fractures are poorly visualized. Cervicothoracic junction is obscured by the patient's shoulders. Small bilateral pleural effusions on the lateral view. Percutaneous gastrostomy. IMPRESSION: 1. T8 through T12 compression deformities are poorly visualized. 2. Small bilateral pleural effusions. Electronically Signed   By: Leanna Battles M.D.   On: 10/08/2020 14:30   DG Shoulder Right  Result Date: 10/04/2020 CLINICAL DATA:  Right shoulder pain. EXAM: RIGHT SHOULDER - 2+ VIEW COMPARISON:  None. FINDINGS: There is no evidence of fracture or dislocation. There is no evidence of arthropathy or other focal bone abnormality. Soft tissues are unremarkable. IMPRESSION: Negative. Electronically Signed   By: Lupita Raider M.D.   On: 10/04/2020 13:40   CT Angio Chest Pulmonary Embolism (PE) W or WO Contrast  Result Date: 10/30/2020 CLINICAL DATA:  Shortness of breath EXAM: CT ANGIOGRAPHY CHEST WITH CONTRAST TECHNIQUE: Multidetector CT imaging of the chest was performed using the standard protocol during bolus administration of intravenous contrast. Multiplanar CT image reconstructions and MIPs were obtained to evaluate the vascular anatomy. CONTRAST:  50mL OMNIPAQUE IOHEXOL 350 MG/ML SOLN COMPARISON:  Chest radiograph October 29, 2020; chest CT August 17, 2020 FINDINGS: Cardiovascular: There is no appreciable pulmonary embolus. There is no thoracic aortic aneurysm or dissection. Visualized great vessels  appear normal. There is no appreciable pericardial effusion or pericardial thickening. There are foci of coronary artery calcification. Mediastinum/Nodes: Thyroid appears unremarkable. No evident  thoracic adenopathy. No esophageal lesion is evident. Lungs/Pleura: Tracheostomy present with tracheostomy catheter tip in mid trachea. There is underlying centrilobular emphysematous change. There are areas of atelectatic change in each upper lobe posteriorly as well as each lung base region. On axial slice 123 series 6, there is a nodular opacity in the lateral segment of the right lower lobe measuring 1.2 x 0.8 x 0.8 cm. This opacity is also seen on coronal slice 74 series 8 and sagittal slice 24 series 9. No similar nodular opacities elsewhere. No evident pleural effusions. No appreciable pneumothorax. Trachea and major bronchial structures appear patent. Upper Abdomen: There is upper abdominal aortic atherosclerosis. Visualized upper abdominal structures otherwise appear unremarkable. Musculoskeletal: There are fractures of the T8 and T9 vertebral bodies with collapse of the disc space in this area and bony remodeling. There is localized kyphosis in this area. Other bony structures appear unremarkable. No chest wall lesions. Review of the MIP images confirms the above findings. IMPRESSION: 1. No appreciable pulmonary embolus. No thoracic aortic aneurysm or dissection. 2. Nodular opacity apical segment right lower lobe measuring 1.2 x 0.8 x 0.8 cm. Consider one of the following in 3 months for both low-risk and high-risk individuals: (a) repeat chest CT, (b) follow-up PET-CT, or (c) tissue sampling. This recommendation follows the consensus statement: Guidelines for Management of Incidental Pulmonary Nodules Detected on CT Images: From the Fleischner Society 2017; Radiology 2017; 284:228-243. 3. No edema or airspace opacity. Underlying emphysematous change. Tracheostomy present. No pneumothorax. 4.  No evident  adenopathy. 5. Fractures of the T8 and T9 vertebral body with localized kyphosis in this area. End plate irregularity noted in this area. Note that if there is any clinical suspicion for potential discitis in this area, MR pre and post-contrast would be the optimum study of choice to further evaluate. Emphysema (ICD10-J43.9). Electronically Signed   By: Bretta Bang III M.D.   On: 10/30/2020 14:46   CT THORACIC SPINE WO CONTRAST  Result Date: 10/08/2020 CLINICAL DATA:  Follow-up examination for spine fracture. EXAM: CT THORACIC SPINE WITHOUT CONTRAST TECHNIQUE: Multidetector CT images of the thoracic were obtained using the standard protocol without intravenous contrast. COMPARISON:  Prior CT from 09/11/2020. FINDINGS: Alignment: Exaggeration of the normal thoracic kyphosis with focal kyphotic angulation at T8-9, increased from previous. No listhesis. Vertebrae: There has been continued interval collapse of previously identified comminuted burst type compression fracture involving the T8 vertebral body. Associated height loss now measures up to approximately 50% with no more than trace 2 mm bony retropulsion. Mild asymmetric widening of the right T8-9 facet, similar to previous. Height loss about the previously identified comminuted a T9 compression fracture is relatively stable. Osseous remodeling with persistent nonunion about these fractures. Additional compression fractures involving the superior aspects of T10 and T11 again seen. No more than mild central height loss without bony retropulsion, similar to previous. Probable additional subtle evolving compression fracture noted involving the superior endplate of T12 with no more than minimal central height loss. Additional probable subacute compression fracture involving the right aspect of the superior endplate of T7 with no more than mild height loss. These were likely present on previous exam. Vertebral body height otherwise maintained with no other  acute or interval fracture. Subacute mildly displaced fractures involving the T6, T7, and T8 spinous process is again noted. Subacute fractures of the right posteromedial third through eleventh ribs. Additional subacute healing fractures of the left posterior seventh through ninth ribs. Additional subacute fractures of the right third  through tenth transverse process fractures again noted. Paraspinal and other soft tissues: Mild paraspinous edema noted about the subacute healing fractures, most pronounced at T8-9. Moderate layering bilateral pleural effusions. Tracheostomy tube in place. Underlying emphysema. Disc levels: Multilevel degenerative disc desiccation without high-grade spinal stenosis. IMPRESSION: 1. Partial interval collapse of previously identified comminuted burst type compression fracture involving the T8 vertebral body, now with up to approximately 50% height loss and trace 2 mm bony retropulsion. 2. Stable height loss about the additional compression fractures involving the T7, T9, T10, T11, and likely T12 vertebral bodies. 3. Subacute mildly displaced fractures involving the T6, T7, and T8 spinous processes. 4. Subacute healing fractures of the right posteromedial third through eleventh ribs, left posterior seventh through ninth ribs, and right third through tenth transverse processes. 5. Moderate layering bilateral pleural effusions with underlying emphysema. Emphysema (ICD10-J43.9). Electronically Signed   By: Rise MuBenjamin  McClintock M.D.   On: 10/08/2020 23:44   DG CHEST PORT 1 VIEW  Result Date: 10/29/2020 CLINICAL DATA:  Short of breath EXAM: PORTABLE CHEST 1 VIEW COMPARISON:  Portable exam 1511 hours compared to 10/14/2020 FINDINGS: Tracheostomy tube stable. Normal heart size, mediastinal contours, and pulmonary vascularity. Minimal scarring at RIGHT base. Remaining lungs clear. No acute infiltrate, pleural effusion, or pneumothorax. Osseous structures unremarkable. IMPRESSION: RIGHT  basilar scarring. No acute abnormalities. Electronically Signed   By: Ulyses SouthwardMark  Boles M.D.   On: 10/29/2020 15:20   DG CHEST PORT 1 VIEW  Result Date: 10/03/2020 CLINICAL DATA:  Respiratory failure EXAM: PORTABLE CHEST 1 VIEW COMPARISON:  09/24/2020 FINDINGS: Tracheostomy is unchanged. Patchy infiltrate within the a right mid lung zone and right lung base is again seen, but appears improved since prior examinations. No pneumothorax or pleural effusion. Cardiac size is within normal limits. Pulmonary vascularity is normal. No acute bone abnormality. IMPRESSION: Improving right mid and lower lung zone pulmonary infiltrate. Electronically Signed   By: Helyn NumbersAshesh  Parikh MD   On: 10/03/2020 06:41   DG Cerv Spine Flex&Ext Only  Result Date: 10/08/2020 CLINICAL DATA:  History of thoracic spine fractures due to a fall 09/11/2020. EXAM: CERVICAL SPINE - FLEXION AND EXTENSION VIEWS ONLY COMPARISON:  Flexion and extension views of the cervical spine 10/07/2020. FINDINGS: No fracture is identified. Range of motion appears limited but no pathologic motion is identified. Multilevel cervical spondylosis is worst at C4-5 and C5-6. Tracheostomy tube noted. IMPRESSION: No acute abnormality.  Negative for pathologic motion. Degenerative disc disease. Electronically Signed   By: Drusilla Kannerhomas  Dalessio M.D.   On: 10/08/2020 14:30   VAS US LOWER EXTREMITY VENOUS (DVT)  Result Date: 10/10/2020  Lower Venous DVT Study Patient Name:  Trenda MootsKENNY Renner  Date of Exam:   10/09/2020 Medical Rec #: [id number]       Accession #:    [id number] Date of Birth: Jan 15, 1966      Patient Gender: M Patient Age:   [age]Y [facility]  Procedure:      VAS US LOWER EXTREMITY VENOUS (DVT) Referring Phys: 1100 Viviano Bir J Koraline Phillipson --------------------------------------------------------------------------------  Indications: Swelling per MD.  Risk Factors: Prolonged hospital stay - trauma with immobilization. Comparison Study: No previous exams.  Performing Technologist: Ernestene MentionJody Hill  Examination Guidelines: A complete evaluation includes B-mode imaging, spectral Doppler, color Doppler, and power Doppler as needed of all accessible portions of each vessel. Bilateral testing is considered an integral part of a complete examination. Limited examinations for reoccurring indications may be performed as noted. The reflux portion of the exam is  performed with the patient in reverse Trendelenburg.  +---------+---------------+---------+-----------+----------+--------------+ RIGHT    CompressibilityPhasicitySpontaneityPropertiesThrombus Aging +---------+---------------+---------+-----------+----------+--------------+ CFV      Full           Yes      Yes                                 +---------+---------------+---------+-----------+----------+--------------+ SFJ      Full                                                        +---------+---------------+---------+-----------+----------+--------------+ FV Prox  Full           Yes      Yes                                 +---------+---------------+---------+-----------+----------+--------------+ FV Mid   Full           Yes      Yes                                 +---------+---------------+---------+-----------+----------+--------------+ FV DistalFull           Yes      Yes                                 +---------+---------------+---------+-----------+----------+--------------+ PFV      Full                                                        +---------+---------------+---------+-----------+----------+--------------+ POP      Full           Yes      Yes                                 +---------+---------------+---------+-----------+----------+--------------+ PTV      Full                                                        +---------+---------------+---------+-----------+----------+--------------+ PERO     Full                                                         +---------+---------------+---------+-----------+----------+--------------+   +---------+---------------+---------+-----------+----------+--------------+ LEFT     CompressibilityPhasicitySpontaneityPropertiesThrombus Aging +---------+---------------+---------+-----------+----------+--------------+ CFV      Full           Yes      Yes                                 +---------+---------------+---------+-----------+----------+--------------+  SFJ      Full                                                        +---------+---------------+---------+-----------+----------+--------------+ FV Prox  Full           Yes      Yes                                 +---------+---------------+---------+-----------+----------+--------------+ FV Mid   Full           Yes      Yes                                 +---------+---------------+---------+-----------+----------+--------------+ FV DistalFull           Yes      Yes                                 +---------+---------------+---------+-----------+----------+--------------+ PFV      Full                                                        +---------+---------------+---------+-----------+----------+--------------+ POP      Full           Yes      Yes                                 +---------+---------------+---------+-----------+----------+--------------+ PTV      Full                                                        +---------+---------------+---------+-----------+----------+--------------+ PERO     Full                                                        +---------+---------------+---------+-----------+----------+--------------+     Summary: BILATERAL: - No evidence of deep vein thrombosis seen in the lower extremities, bilaterally. - No evidence of superficial venous thrombosis in the lower extremities, bilaterally. -No evidence of popliteal cyst, bilaterally.   *See table(s)  above for measurements and observations. Electronically signed by Waverly Ferrari MD on 10/10/2020 at 6:53:27 AM.    Final     Labs:  Basic Metabolic Panel: No results for input(s): NA, K, CL, CO2, GLUCOSE, BUN, CREATININE, CALCIUM, MG, PHOS in the last 168 hours.  CBC: No results for input(s): WBC, NEUTROABS, HGB, HCT, MCV, PLT in the last 168 hours.  CBG: Recent Labs  Lab 10/26/20 0612 10/27/20 0554 10/28/20 0614 10/29/20 0541 10/30/20 0604  GLUCAP 119* 128* 123* 153* 135*  Brief HPI:   Charles Weeks is a 55 y.o. right-handed male with history of COPD followed by Dr. Sherene Sires, CAD/MI with DES placement in RCA January 2020 maintained on aspirin, hypertension, depression/anxiety, BPH, alcohol use as well as tobacco.  Per chart review lives with a sister.  Independent prior to admission.  Two-level home bedroom downstairs.  Presented 08/17/2020 after a fall down approximately 11 stairs and was found by sister.  Denied loss of consciousness.  Noted to be hypoxic in the 70s placed on nonrebreather mask.  Cranial CT scan negative for acute changes.  CT cervical spine showed mildly displaced acute fractures of the right C7 transverse process extending to involve the transverse foramen.  CT of the chest abdomen pelvis showed a large right pneumothorax with small amount of hemorrhage within the pleural space.  3 column fracture of the T8 vertebral body.  No retropulsion.  Fractures through the neural arch of T7.  Multiple right transverse process fractures involving T4-T8 vertebral bodies.  Multiple bilateral rib fractures at the costovertebral junction from T5-T8 on the right and T7 and T8 on the left.  No evidence of solid organ injury.  CT angiogram of the neck showed no acute vascular injury to the neck.  Patient did sustain complex laceration of the scalp with repair.  Follow-up neurosurgery Dr. Marikay Alar in regards to 3 column T8 vertebral body fracture nonoperative placed in a brace.  Right C7  transverse process fracture through foramen initially with cervical collar that was later discontinued.  Hospital course prolonged intubation undergoing tracheostomy as well as gastrostomy tube placement 09/03/2020 per Dr. Janee Morn.  Trach changed to #6 cuffless 10/05/2020 due to secretions.  He was cleared to begin Lovenox for DVT prophylaxis as well as resume home aspirin.  Acute blood loss anemia 10.1 and monitored.  Leukocytosis 19,200 improved to 13,500.  Bouts of urinary retention placed on Urecholine as well as Hytrin/Flomax.  Dysphagia #2 thin liquid diet.  Palliative care consulted to establish goals of care.  Therapy evaluations completed due to patient decreased functional mobility was admitted for a comprehensive rehab program.   Hospital Course: Zaya Spektor was admitted to rehab 10/09/2020 for inpatient therapies to consist of PT, ST and OT at least three hours five days a week. Past admission physiatrist, therapy team and rehab RN have worked together to provide customized collaborative inpatient rehab.  Pertaining to patient's debility secondary to fall resulting in 3 column T8 vertebral body fracture T7 neural arch fracture right TVP T4-8/right C7 transverse process fracture through foramen.  Back brace as as advised no surgical intervention follow-up neurosurgery.  Subcutaneous Lovenox for DVT prophylaxis venous Doppler studies negative.  Pain managed with use of oxycodone as needed.  Mood stabilization with Wellbutrin as well as BuSpar with Klonopin he continued on Seroquel as directed mood and restlessness greatly improved followed by neuropsychology.  Acute blood loss anemia 12.0 no bleeding episodes.  Large scalp laceration right eyebrow repaired sutures removed.  Blood pressure controlled on Lopressor no chest pain or shortness of breath he did have a history of CAD/MI/DES.  Acute hypercarbic ventilatory respiratory failure/COPD he was followed by Dr. Sherene Sires long-term.  Tracheostomy 09/03/2020  per Dr. Janee Morn changed to a #6 cuffless 10/05/2020 due to secretions.  ENT consulted moderate subglottic granulation causing 75% obstruction of airway above trach.  Muscle tension dysphonia noted plan for direct laryngoscopy as outpatient.  He was slowly weaned from his prednisone.  Evaluation of episodic decrease in oxygen saturation CT chest  showed nodular opacity apical segment right lower lobe measuring 1.2 x 0.8 x 0.8 cm on follow-up CT of chest versus PET scan 3 months.  There Was No Current Plan cap trach tube.  Currently maintained on a dysphagia #3 thin liquid diet as well as gastrostomy tube that was placed 09/03/2020 per Dr. Janee Morn.  Recommendations the patient keep PEG tube for now given potential tracheal/pulmonary interventions after discharge.  BPH with urinary retention maintained on Flomax as well as Urecholine with Hytrin in and out catheterizations as needed and family to decide on need for catheterizations versus Foley tube and plan outpatient urology consult.  Patient did have a history of alcohol tobacco use provided counts regards to cessation of these products.   Blood pressures were monitored on TID basis and soft and monitored     Rehab course: During patient's stay in rehab weekly team conferences were held to monitor patient's progress, set goals and discuss barriers to discharge. At admission, patient required minimal assist 100 feet rolling walker minimal assist sit to supine moderate assist supine to sit total assist lower body bathing max a set body dressing total assist lower body dressing  Physical exam.  Blood pressure 111/66 pulse 83 temperature 98.2 respirations 18 oxygen saturations 90% room air Constitutional.  No acute distress HEENT.  Scalp laceration healed Eyes.  Pupils round and reactive to light no discharge.nystagmus Neck.  Tracheostomy tube in place Cardiac regular rate rhythm no extra sounds or murmur heard Pulmonary.  Slightly coarse no rhonchi or  wheeze Abdominal.  PEG tube in place positive bowel sounds nontender Musculoskeletal. Comments.  Upper extremities 5 -/5 in all muscles tested B/L Lower extremities hip flexors and knee extension/knee flexion 4+/5, dorsiflexion and plantarflexion 4 -/5B/L Neurologic.  Alert back brace in place makes eye contact with examiner provides name and age  He/She  has had improvement in activity tolerance, balance, postural control as well as ability to compensate for deficits. He/She has had improvement in functional use RUE/LUE  and RLE/LLE as well as improvement in awareness.  Patient rolled in bed with supervision back brace applied family teaching ongoing for placement of brace.  Stand step transfer to wheelchair with supervision.  Patient performs NMR for standing balance and activity tolerance.  Ambulates 320 feet contact-guard assist.  Gather his belongings for activities day living and homemaking.  Speech therapy provided education and diet management with patient and family.  Patient encouraged to utilize strategies to increase intelligibility including diaphragmatic breathing for dysphonia.  Full family teaching completed plan discharged to home       Disposition: Discharged to home    Diet: Mechanical soft thin liquids  Special Instructions: No driving smoking or alcohol  Back brace as directed.  Patient should follow-up with Dr. Violeta Gelinas [phone] in regards to removing PEG tube once established plan for tracheostomy  Recommend follow-up chest CT versus PET scan in 3 months for further evaluation of nodular opacity apical segment right lower lobe   Follow-up otolaryngology for outpatient laryngoscopy dilatation  Urology follow up outpatient referral for retention  Medications at discharge 1.  Tylenol as needed 2.  Aspirin 81 mg daily 3.  Urecholine 50 mg 4 times daily 4.  Wellbutrin 300 mg daily 5.  BuSpar 10 mg 3 times daily 6.  Klonopin 0.5 mg nightly 7.  Colace 100  mg daily 8.  Folic acid 1 mg daily 9.  Lidoderm patch change as directed 10  Robitussin-DM 15 mL every 4 hours 11  Singulair 10 mg nightly  12  Multivitamin daily 13.  Oxycodone 5 mg every 4 hours as needed pain 14.  Protonix 40 mg daily 15.  Prednisone 5 mg daily with breakfast 16  MiraLAX daily hold for loose stools 17.  Seroquel 150 mg twice daily and 300 mg nightly 18  Scopolamine patch change every 72 hours 19.  Flomax 0.4 mg twice daily 20  Hytrin 2 mg daily 21  Albuterol nebulizer 2.5 mg every 6 hours as needed wheezing 22 Dulera 2 puffs 2 times daily   30-35 minutes were spent completing discharge summary and discharge planning  Discharge Instructions     Ambulatory referral to Physical Medicine Rehab   Complete by: As directed    Moderate complexity follow-up 1 to 2 weeks SCI/thoracic fracture   Ambulatory referral to Urology   Complete by: As directed    Outpatient follow-up referral in regards to BPH/urinary retention/spinal cord injury        Follow-up Information     Ranelle Oyster, MD Follow up.   Specialty: Physical Medicine and Rehabilitation Why: Office to call for appointment Contact information: [address] [place] [address] [phone]         Violeta Gelinas, MD Follow up.   Specialty: General Surgery Why: Call for appointment Contact information: [address] [place] [place] [address] [phone]         Tia Alert, MD Follow up.   Specialty: Neurosurgery Why: Call for appointment Contact information: [address] [place] [address] [phone]         Skotnicki, Meghan A, DO Follow up.   Specialty: Otolaryngology Why: Call for appointment in regards to establishing plans for laryngoscopy Contact information: [address] [place] [address] [phone]         Nyoka Cowden, MD Follow up.   Specialty: Pulmonary Disease Why: Call for  appointment Contact information: [address] [place] [address] [place] [address] [phone]                 Signed: Mcarthur Rossetti Nicholette Dolson 10/31/2020, 5:18 AM

## 2020-10-29 NOTE — Progress Notes (Signed)
7/10Physical Therapy Session Note  Patient Details  Name: Charles Weeks MRN: [id number] Date of Birth: January 31, 1966  Today's Date: 10/29/2020 PT Individual Time: 1300-1335 PT Individual Time Calculation (min): 35 min   Short Term Goals: Week 1:  PT Short Term Goal 1 (Week 1): Pt will perform sit to stand with CGA PT Short Term Goal 1 - Progress (Week 1): Met PT Short Term Goal 2 (Week 1): Pt will perform bed to chair transfer with CGA. PT Short Term Goal 2 - Progress (Week 1): Met PT Short Term Goal 3 (Week 1): Pt will ambulate 100' with CGA and LRAD. PT Short Term Goal 3 - Progress (Week 1): Progressing toward goal Week 2:  PT Short Term Goal 1 (Week 2): STGs = LTGs Week 3:     Skilled Therapeutic Interventions/Progress Updates:    Pain:  Pt reports 7/10 back and R shoulder pain.  Declined requesting pain meds from nursing.Treatment to tolerance.  Rest breaks and repositioning as needed.  Pt initially seated on edge of bed and agreeable to treatment session w/focus on functional mobility.  Audible wheezing, declines need for breathing treatment. Sit to stand w/cga, gait 52f to wc w/cga, 02 5L via trach collar. Wc propulsion x 633fw/bilat Ues, transported remainder of distance to gym for energy conservation.  Sit to stand from wc w/cga, gait 15018f/cga, staggaring quality to gait.  HR 115, 02 sats 90%.  02 sats recover to 95 after 5 min seated rest, HR remains 110.  Pt c/o tightness in chest throughout session.  Labored breathing w/very slow recovery to resting RR.  States he "does not feel good". Nursing notitifed of complaints and difficulty recovering HR to resting after activity.  Respiratory therapy contacted. Pt stand pivot transfer to edge of bed w/cga.  Respt therapist arrived for treatment. Pt handed off to RT w/02 returned to wall supply at 5L, bed alarm set, pt seated on edge of bed. 25 min missed time due to tolerance, need for respiratory care.  Therapy  Documentation Precautions:  Precautions Precautions: Back Precaution Booklet Issued: Yes (comment) Precaution Comments: trach collar, TLSO don with rolling if HOB >30,  abdominal binder to protect PEG, c-collar d/c'ed 5/24 Required Braces or Orthoses: Spinal Brace Cervical Brace: Hard collar, At all times Spinal Brace: Thoracolumbosacral orthotic, Applied in supine position Restrictions Weight Bearing Restrictions: No    Therapy/Group: Individual Therapy BarCallie FieldingT Rexford13/2022, 4:25 PM

## 2020-10-29 NOTE — Progress Notes (Signed)
Occupational Therapy TBI Note  Patient Details  Name: Charles Weeks MRN: [id number] Date of Birth: 10-17-1965  Today's Date: 10/29/2020 OT Individual Time: 1004-1100 OT Individual Time Calculation (min): 56 min    Short Term Goals: Week 2:  OT Short Term Goal 1 (Week 2): Pt will instruct caregiver on donning TLSO with min cueing OT Short Term Goal 1 - Progress (Week 2): Progressing toward goal OT Short Term Goal 2 (Week 2): Patient will tolerate standing for 5 minutes in prep for BADL task OT Short Term Goal 2 - Progress (Week 2): Met OT Short Term Goal 3 (Week 2): Pt will use AE for LB dressing with min questioning cues OT Short Term Goal 3 - Progress (Week 2): Progressing toward goal Week 3:  OT Short Term Goal 1 (Week 3): LTG=STG 2/2 ELOS  Skilled Therapeutic Interventions/Progress Updates:    Pt received EOB and consented to OT tx. Pt remained on 5L O2 during session, VS monitored and stable throughout session. Pt declined ADL, reports he is very tired because nursing kept him up all night. Req encouragement for therapy participation. Pt seen for instruction and training in [place] and for functional standing activity tolerance to increase ADL and functional mobility independence. Pt instructed in 3#dowel rod exercises including chest press, elbow flexion, and shoulder flexion for 3x15, orange theraband tricep extension for 3x15 all with min cuing for proper technique with good carryover. Pt required frequent rest breaks due to fatigue. Pt then instructed in standing activity with functional reach and matching card activity with no device. Pt able to remain standing and had no LOB with 3 seated rest breaks during activity due to fatigue. After tx, pt left sitting EOB with alarm on and on 5L O2 with all needs met.   Therapy Documentation Precautions:  Precautions Precautions: Back Precaution Booklet Issued: Yes (comment) Precaution Comments: trach collar, TLSO don with  rolling if HOB >30,  abdominal binder to protect PEG, c-collar d/c'ed 5/24 Required Braces or Orthoses: Spinal Brace Cervical Brace: Hard collar, At all times Spinal Brace: Thoracolumbosacral orthotic, Applied in supine position Restrictions Weight Bearing Restrictions: No  Vital Signs: Therapy Vitals Pulse Rate: 96 Resp: 20 Patient Position (if appropriate): Sitting Oxygen Therapy SpO2: 97 % O2 Device: Tracheostomy Collar O2 Flow Rate (L/min): 5 L/min FiO2 (%): 28 % Pain: Pain Assessment Pain Scale: 0-10 Pain Score: 7  Pain Intervention(s): Medication (See eMAR) Agitated Behavior Scale: TBI Observation Details Observation Environment: CIR Start of observation period - Date: 10/29/20 Start of observation period - Time: 1000 End of observation period - Date: 10/29/20 End of observation period - Time: 1057 Agitated Behavior Scale (DO NOT LEAVE BLANKS) Short attention span, easy distractibility, inability to concentrate: Present to a slight degree Impulsive, impatient, low tolerance for pain or frustration: Absent Uncooperative, resistant to care, demanding: Absent Violent and/or threatening violence toward people or property: Absent Explosive and/or unpredictable anger: Absent Rocking, rubbing, moaning, or other self-stimulating behavior: Absent Pulling at tubes, restraints, etc.: Absent Wandering from treatment areas: Absent Restlessness, pacing, excessive movement: Absent Repetitive behaviors, motor, and/or verbal: Absent Rapid, loud, or excessive talking: Absent Sudden changes of mood: Absent Easily initiated or excessive crying and/or laughter: Absent Self-abusiveness, physical and/or verbal: Absent Agitated behavior scale total score: 15    Therapy/Group: Individual Therapy  Montez Stryker 10/29/2020, 10:57 AM

## 2020-10-29 NOTE — Progress Notes (Signed)
PROGRESS NOTE   Subjective/Complaints: No complaints Asks how long he will need brace for Asks when PEG will be removed Plan for d/c this week  ROS:   Pt denies SOB, abd pain, CP, N/V/C/D, and vision changes, PEG is bothersome to him   Objective:   No results found. No results for input(s): WBC, HGB, HCT, PLT in the last 72 hours. No results for input(s): NA, K, CL, CO2, GLUCOSE, BUN, CREATININE, CALCIUM in the last 72 hours.  Intake/Output Summary (Last 24 hours) at 10/29/2020 1456 Last data filed at 10/29/2020 1246 Gross per 24 hour  Intake 720 ml  Output 1650 ml  Net -930 ml        Physical Exam: Vital Signs Blood pressure (!) 146/87, pulse 91, temperature 98.1 F (36.7 C), resp. rate 16, height 5\' 8"  (1.727 m), weight 81.9 kg, SpO2 97 %.  Gen: no distress, normal appearing HEENT: oral mucosa pink and moist, NCAT Cardio: Reg rate Chest: normal effort, normal rate of breathing Abd: soft, non-distended Ext: no edema Psych: pleasant, normal affect  Skin: scattered abrasions. See above Neurologic:  alert, oriented. motor strength is 3- Right and 4 /5 Left  deltoid, 4/5 bilateral bicep, tricep, grip, hip flexor, knee extensors, ankle dorsiflexor and plantar flexor. Motor stable. Sensory exam unremarkable  Musculoskeletal: some pain with PROM RIght shoulder abd/flex/IR/ER --no change   Assessment/Plan: 1. Functional deficits which require 3+ hours per day of interdisciplinary therapy in a comprehensive inpatient rehab setting. Physiatrist is providing close team supervision and 24 hour management of active medical problems listed below. Physiatrist and rehab team continue to assess barriers to discharge/monitor patient progress toward functional and medical goals  Care Tool:  Bathing    Body parts bathed by patient: Chest, Left arm, Right arm, Abdomen   Body parts bathed by helper: Left lower leg, Right  lower leg     Bathing assist Assist Level: Minimal Assistance - Patient > 75%     Upper Body Dressing/Undressing Upper body dressing   What is the patient wearing?: Pull over shirt, Orthosis    Upper body assist Assist Level: Moderate Assistance - Patient 50 - 74%    Lower Body Dressing/Undressing Lower body dressing      What is the patient wearing?: Underwear/pull up, Pants     Lower body assist Assist for lower body dressing: Minimal Assistance - Patient > 75%     Toileting Toileting    Toileting assist Assist for toileting: Set up assist     Transfers Chair/bed transfer  Transfers assist     Chair/bed transfer assist level: Contact Guard/Touching assist     Locomotion Ambulation   Ambulation assist      Assist level: Contact Guard/Touching assist Assistive device: No Device Max distance: 171ft   Walk 10 feet activity   Assist     Assist level: Contact Guard/Touching assist Assistive device: No Device   Walk 50 feet activity   Assist    Assist level: Contact Guard/Touching assist Assistive device: No Device    Walk 150 feet activity   Assist Walk 150 feet activity did not occur: Safety/medical concerns  Assist level: Contact Guard/Touching assist Assistive  device: No Device    Walk 10 feet on uneven surface  activity   Assist Walk 10 feet on uneven surfaces activity did not occur: Safety/medical concerns         Wheelchair     Assist Will patient use wheelchair at discharge?: No             Wheelchair 50 feet with 2 turns activity    Assist            Wheelchair 150 feet activity     Assist          Blood pressure (!) 146/87, pulse 91, temperature 98.1 F (36.7 C), resp. rate 16, height 5\' 8"  (1.727 m), weight 81.9 kg, SpO2 97 %.    Medical Problem List and Plan: 1.  Debility secondary to fall 08/17/20 with 3 column T8 vertebral body fracture/T7 neural arch fracture/right TVP T4-8/right C7  transverse process fracture through the foramen.  CTO to be applied in supine position             -patient may shower with CTO             -ELOS/Goals:10/31/20- supervision to min A            -Continue PT, OT, and SLP/CIR   2.  Impaired mobility -DVT/anticoagulation: Continue Lovenox.                -vascular study negative             -antiplatelet therapy: Aspirin 81 mg daily 3. Pain Management: Continue Oxycodone as needed             -?mild neuropathic pain RUE. 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 Seems tolerable  -6/13-continues to require a lot of oxycodone, provide counseling.  4. Mood: Continue Wellbutrin 300 mg daily, BuSpar 10 mg 3 times daily, Klonopin 0.5 mg nightly,              -antipsychotic agents: Seroquel 150 mg twice daily and 300 mg nightly             -behavior has been appropriate 5. Neuropsych: This patient is not capable of making decisions on his own behalf. 6. Skin/Wound Care: Routine skin checks 7. Fluids/Electrolytes/Nutrition:                -recent lab work is within normal limits.             -has good appetite 8.  Acute blood loss anemia.  Follow-up CBC 9.  Multiple bilateral rib fractures.  Conservative care 10.  3: T8 vertebral body fracture.  Follow-up neurosurgery Dr. 11/02/20.  Nonoperative.               -CTO for HOB>30 degrees at this point-  Injury 08/17/20  -nipple pain is from contact with CTO--- tape nipples, lanolin oil   6/12- stable with therapy- con't regimen 11.  T7 neural arch fracture.  Follow-up neurosurgery 12.  Right TVP T4-8.  Pain control follow neurosurgery 13.  Right C7 TVP fracture through foramen.  CTA negative.  CTO 14.  Large scalp laceration right eyebrow laceration.  Repaired 08/17/2020.  Sutures removed 15.  Hypertension.  Lopressor 25 mg twice daily 16.  CAD/MI/DES.  Continue aspirin 17.  Acute hypercarbic ventilatory dependent  respiratory failure/COPD.  Tracheostomy 09/03/2020 per Dr. 09/05/2020.  Changed back to #6 cuffless 10/05/2020 due  to secretions.  Patient will follow up long-term with Dr. Sherene Sires           CBC CXR unremarkable   -6/10   -off abx, on 5L O2   -continue trach mgt, breathing treatments etc    ENT consulted. 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 Probably will do as outpt once pt is out of CTO. F/u with NS regarding brace timing    -decrease prednisone to 5mg  qd today   -continue above nebs, inhalers, O2   -appreciate PCCCM follow up   -continue #6 with PMV only  6/11- no change since yesterday- con't #6 Sh[place] and PMV- will not cap pt.   6/12- sounds medically OK- pt frustrated cannot get it removed, but re-explained why- con't regimen    18.  Dysphagia.  Gastrostomy tube 09/03/2020 per Dr. 09/05/2020.               -Tolerating Dysphagia #3 thin liquids--advance per SLP  6/10 recommended that pt keep in PEG given potential tracheal/pulmonary interventions after discharge 19.  BPH/urine retention:              -oob to void for all attempts   6/10 pt on bid flomax, 50mg  qid urecholine as well as hytrin   -almost always having to be cathed   -pt will need to decide if he wants his wife to catheterize him at home vs placement of foley.    -will request nurses begin education re caths   -regardless he will need outpt urology consult 20.  History of alcohol tobacco abuse.  Provide counseling 21. Constipation---+  6/11- LBM yesterday- documented in chart- con't to monitor    LOS: 20 days A FACE TO FACE EVALUATION WAS PERFORMED  Sharman Garrott 10/29/2020, 2:56 PM

## 2020-10-29 NOTE — Progress Notes (Addendum)
NAME:  Dmarius Reeder, MRN:  [id number], DOB:  [birth year], LOS: 20 ADMISSION DATE:  10/09/2020, CONSULTATION DATE:  6/3 REFERRING MD:  Riley Kill, CHIEF COMPLAINT:  Trach management and COPD  History of Present Illness:  55 year old male who we follow in clinic for GOLD IV COPD (FEV1 27%). Just discharged from [facility] 5/24 to in-patient rehab after he sustained multiple traumatic injuries from a fall on 4/1 (C7 transverse process fx, 3 column T8 fracture, T7 neuro arch fracture, multiple rib fractures w/ hemo-pneumothorax requiring chest tube). This resulted in prolonged need for mechanical ventilation w/ hosp course c/b difficulty weaning, encephalopathy and ultimately trach/peg.   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  Most recent hospital course also complicated by agitated delirium   Significant Hospital Events: Including procedures, antibiotic start and stop dates in addition to other pertinent events   5/24 admitted to in-pt rehab. At time of admit was using PMV and tolerating diet. Had cuffless trach (changed from cuff on 5/20) 5/24 - 6/3 on 5/28 sats low req increased oxygen needing to go back on ATC. Started on augmentin 5/29 for tracheobronchitis. 6/1 seen by neuro-psych for depression and anxiety. Working w/ PT and OT. Tolerating rehab efforts. Trach team asked to see on 6/3 trach was supposed to be change to 4 but was changed to 6 instead.  6/5 had to be emergently suctioned. Had marked distress w/ increased oxygen needs.  6/6 asked ENT to see. 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 rate 20, height 5\' 8"  (1.727 m), weight 81.9 kg, SpO2 97 %.    FiO2 (%):  [28 %] 28 %   Intake/Output Summary (Last 24 hours) at 10/29/2020 1004 Last data filed at 10/29/2020 0830 Gross per 24 hour  Intake 720 ml  Output 2160 ml  Net -1440 ml   Filed Weights   10/09/20 1640 10/14/20 1410  Weight: 80.5 kg 81.9 kg    Examination:  General sitting up in chair. No distress HENT NCAT no JVD. Good phonation w/ 6 cuffless trach Pulm dec bases no accessory use  Card rrr Abd soft. 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 Does have subglottic granulation tissue which is the reason he is not a candidate for decannulation at this point and will need subglottic dilation before this can be considered.   Plan Dc pred Cont PMV Routine trach care No down-size Not candidate for decannulation at this point given subglottic stenosis will need OP DL and dilation by ENT. This can be done after his f/u with Dr 10/16/20.  He will be cared for by his sister whom I know well as she was  a prior patient of mine. She will be able to provide his trach care    Simonne Martinet ACNP-BC [facility] Pulmonary/Critical Care Pager # [phone] OR # [phone] if no answer      Attending attestation:  Patient seen and examined at bedside with OT present. Complaining that he is a little more SOB than this morning and dropping O2 saturations. Got a little better after suctioning, but not back  to where he was this morning. Sticking to in-bed exercises for now due to saturations dropping with OOB mobility this afternoon.   BP 131/85   Pulse 94   Temp 97.6 F (36.4 C)   Resp 18   Ht 5\' 8"  (1.727 m)   Wt 81.9 kg   SpO2 94%   BMI 27.45 kg/m  Chronically ill appearing man lying in bed in NAD [place]/AT Eyes anicteric Breathing comfortably on TC- mild purse-lipped breathing. Distant bilateral, symmetric breath sounds. No rhonchi. No gush of air or changes when PMV removed. 97%, HR 93 on tele after PMV removed. S1S2, RRR Abd obese, soft Skin warm, dry Healing scar on scalp No rashes Awake, moving extremities, nodding to answer questions.  No recent labs or CXRs.  Assessment & plan: Acute respiratory failure requiring tracheostomy after prolonged critical illness COPD Severe deconditioning -CXR today -keep PMV off until breathing improved -if increasing sputum volume, please reculture -closely monitor VS -agree with OT for in bed mobility until SOB improves. -routine trach care -not a candidate for decannulation or downsizing from pulmonary standpoint  We will continue to follow periodically. Please call with questions in the interim.  , DO 10/29/20 2:32 PM [facility]

## 2020-10-29 NOTE — Progress Notes (Signed)
Speech Language Pathology Daily Session Note  Patient Details  Name: Charles Weeks MRN: [id number] Date of Birth: 05/29/1965  Today's Date: 10/29/2020 SLP Individual Time: 1700-1749 SLP Individual Time Calculation (min): 15 min  Short Term Goals: Week 3: SLP Short Term Goal 1 (Week 3): STGs=LTGs due to ELOS  Skilled Therapeutic Interventions: Skilled treatment session focused on dysphagia goals. Upon arrival, patient was sitting EOB with PMSV and TLSO brace in place. Patient agreeable to trials of regular textures. Patient demonstrated efficient mastication and complete oral clearance without overt s/s aspiration. Recommend patient upgrade to regular textures. Patient appeared mildly brighter today and more engaged but continues to report "tight" breathing. SLP did not attempt breathing/voice exercises as patient reported they are "annoying."  Patient left upright sitting EOB with alarm on and all needs within reach. Continue with current plan of care.      Pain Pain Assessment Pain Scale: 0-10 Pain Score: 7  Pain Intervention(s): Medication (See eMAR)  Therapy/Group: Individual Therapy  William Laske 10/29/2020, 12:05 PM

## 2020-10-29 NOTE — Progress Notes (Signed)
Occupational Therapy Session Note  Patient Details  Name: Charles Weeks MRN: [id number] Date of Birth: [birth year]  Today's Date: 10/29/2020 OT Individual Time: 6579-0383 OT Individual Time Calculation (min): 23 min    Short Term Goals:  Week 3:  OT Short Term Goal 1 (Week 3): LTG=STG 2/2 ELOS  Skilled Therapeutic Interventions/Progress Updates:    Pt received in bed in room with NT completing bladder scan and consented to OT tx. Pt on 5L O2. Pt reports he is very tired, prefers to complete in-bed exercises this session. Pt seen for instruction and training in BUE strengthening HEP with orange theraband to increase strength and activity tolerance for ADLs and functional transfers and mobility. During exercises, pt desat's to around 82% with minimal activity. Lung doctor came in, communicated issues with her, she removed PMSV in hopes to improve O2 sats during activity. Pt instructed in chest press, elbow flexion and elbow extension for 3x10, requiring rest breaks in between exercises in order to increase O2 sats to >90%. Pt able to recover within a couple of seconds, however continues to c/o SOB. VSS at rest, with HR increasing to ~110 during activity as well. Ended session early 2/2 pt's fatigue and desatting with minimal activity, MD ordering chest xray. Pt left semifowler in bed with all needs met, bed alarm on, and call light in reach and VSS.  Therapy Documentation Precautions:  Precautions Precautions: Back Precaution Booklet Issued: Yes (comment) Precaution Comments: trach collar, TLSO don with rolling if HOB >30,  abdominal binder to protect PEG, c-collar d/c'ed 5/24 Required Braces or Orthoses: Spinal Brace Cervical Brace: Hard collar, At all times Spinal Brace: Thoracolumbosacral orthotic, Applied in supine position Restrictions Weight Bearing Restrictions: No  Vital Signs: Therapy Vitals Pulse Rate: 94 Resp: 18 Patient Position (if appropriate): Sitting Oxygen  Therapy SpO2: 94 % O2 Device: Tracheostomy Collar O2 Flow Rate (L/min): 5 L/min FiO2 (%): 28 % Pain: Pain Assessment Pain Scale: 0-10 Pain Score: 7  Pain Intervention(s): Medication (See eMAR)    Therapy/Group: Individual Therapy  Charles Weeks 10/29/2020, 2:14 PM

## 2020-10-30 ENCOUNTER — Inpatient Hospital Stay (HOSPITAL_COMMUNITY): Payer: Medicaid Other

## 2020-10-30 ENCOUNTER — Other Ambulatory Visit (HOSPITAL_COMMUNITY): Payer: Self-pay

## 2020-10-30 DIAGNOSIS — J9601 Acute respiratory failure with hypoxia: Secondary | ICD-10-CM

## 2020-10-30 DIAGNOSIS — R5381 Other malaise: Secondary | ICD-10-CM

## 2020-10-30 LAB — GLUCOSE, CAPILLARY: Glucose-Capillary: 135 mg/dL — ABNORMAL HIGH (ref 70–99)

## 2020-10-30 LAB — TROPONIN I (HIGH SENSITIVITY): Troponin I (High Sensitivity): 4 ng/L (ref <18)

## 2020-10-30 MED ORDER — TERAZOSIN HCL 1 MG PO CAPS: 2.0000 mg | ORAL_CAPSULE | Freq: Every day | ORAL | Status: DC

## 2020-10-30 MED ORDER — CLONAZEPAM 0.5 MG PO TABS
0.5000 mg | ORAL_TABLET | Freq: Every day | ORAL | 0 refills | Status: DC
  Filled 2020-10-30: qty 30, 30d supply, fill #0

## 2020-10-30 MED ORDER — SENNA 8.6 MG PO TABS
1.0000 | ORAL_TABLET | Freq: Two times a day (BID) | ORAL | Status: DC
  Administered 2020-10-31: 8.6 mg via ORAL
  Filled 2020-10-30: qty 1

## 2020-10-30 MED ORDER — DULERA 200-5 MCG/ACT IN AERO
2.0000 | INHALATION_SPRAY | Freq: Two times a day (BID) | RESPIRATORY_TRACT | 1 refills | Status: DC
  Filled 2020-10-30: qty 13, 30d supply, fill #0

## 2020-10-30 MED ORDER — GUAIFENESIN-DM 100-10 MG/5ML PO SYRP
15.0000 mL | ORAL_SOLUTION | ORAL | Status: DC
  Administered 2020-10-30 – 2020-10-31 (×5): 15 mL via ORAL
  Filled 2020-10-30 (×6): qty 15

## 2020-10-30 MED ORDER — ACETAMINOPHEN 325 MG PO TABS: 650.0000 mg | ORAL_TABLET | Freq: Four times a day (QID) | ORAL | Status: AC | PRN

## 2020-10-30 MED ORDER — THIAMINE HCL 100 MG PO TABS
100.0000 mg | ORAL_TABLET | Freq: Every day | ORAL | Status: DC
  Administered 2020-10-31: 100 mg via ORAL
  Filled 2020-10-30: qty 1

## 2020-10-30 MED ORDER — BUPROPION HCL ER (XL) 300 MG PO TB24
300.0000 mg | ORAL_TABLET | Freq: Every day | ORAL | 0 refills | Status: DC
  Filled 2020-10-30: qty 30, 30d supply, fill #0

## 2020-10-30 MED ORDER — POLYETHYLENE GLYCOL 3350 17 G PO PACK: 17.0000 g | PACK | Freq: Every day | ORAL | 0 refills | Status: DC

## 2020-10-30 MED ORDER — SCOPOLAMINE 1 MG/3DAYS TD PT72
1.0000 | MEDICATED_PATCH | TRANSDERMAL | 12 refills | Status: DC
  Filled 2020-10-30: qty 10, 30d supply, fill #0

## 2020-10-30 MED ORDER — IOHEXOL 350 MG/ML SOLN
50.0000 mL | Freq: Once | INTRAVENOUS | Status: AC | PRN
  Administered 2020-10-30: 50 mL via INTRAVENOUS

## 2020-10-30 MED ORDER — ADULT MULTIVITAMIN W/MINERALS CH: 1.0000 | ORAL_TABLET | Freq: Every day | ORAL | Status: DC

## 2020-10-30 MED ORDER — MONTELUKAST SODIUM 10 MG PO TABS
10.0000 mg | ORAL_TABLET | Freq: Every day | ORAL | 0 refills | Status: DC
  Filled 2020-10-30: qty 30, 30d supply, fill #0

## 2020-10-30 MED ORDER — TAMSULOSIN HCL 0.4 MG PO CAPS
0.4000 mg | ORAL_CAPSULE | Freq: Two times a day (BID) | ORAL | 0 refills | Status: DC
  Filled 2020-10-30: qty 60, 30d supply, fill #0

## 2020-10-30 MED ORDER — ASPIRIN 81 MG PO CHEW
81.0000 mg | CHEWABLE_TABLET | Freq: Every day | ORAL | Status: DC
  Administered 2020-10-31: 81 mg via ORAL
  Filled 2020-10-30: qty 1

## 2020-10-30 MED ORDER — ADULT MULTIVITAMIN W/MINERALS CH
1.0000 | ORAL_TABLET | Freq: Every day | ORAL | Status: DC
  Administered 2020-10-31: 1 via ORAL
  Filled 2020-10-30: qty 1

## 2020-10-30 MED ORDER — PANTOPRAZOLE SODIUM 40 MG PO TBEC
40.0000 mg | DELAYED_RELEASE_TABLET | Freq: Every day | ORAL | 1 refills | Status: DC
  Filled 2020-10-30: qty 30, 30d supply, fill #0

## 2020-10-30 MED ORDER — OXYCODONE HCL 5 MG PO TABS
5.0000 mg | ORAL_TABLET | ORAL | 0 refills | Status: DC | PRN
  Filled 2020-10-30: qty 30, 5d supply, fill #0

## 2020-10-30 MED ORDER — NITROGLYCERIN 0.4 MG SL SUBL: 0.4000 mg | SUBLINGUAL_TABLET | SUBLINGUAL | Status: DC | PRN

## 2020-10-30 MED ORDER — DOCUSATE SODIUM 100 MG PO CAPS
100.0000 mg | ORAL_CAPSULE | Freq: Every day | ORAL | Status: DC
  Administered 2020-10-31: 100 mg via ORAL
  Filled 2020-10-30: qty 1

## 2020-10-30 MED ORDER — BUSPIRONE HCL 10 MG PO TABS
10.0000 mg | ORAL_TABLET | Freq: Three times a day (TID) | ORAL | 0 refills | Status: DC
  Filled 2020-10-30: qty 90, 30d supply, fill #0

## 2020-10-30 MED ORDER — BUDESONIDE-FORMOTEROL FUMARATE 160-4.5 MCG/ACT IN AERO
INHALATION_SPRAY | RESPIRATORY_TRACT | 12 refills | Status: DC
  Filled 2020-10-30: qty 10.2, 30d supply, fill #0

## 2020-10-30 MED ORDER — TERAZOSIN HCL 2 MG PO CAPS
2.0000 mg | ORAL_CAPSULE | Freq: Every day | ORAL | 0 refills | Status: DC
  Filled 2020-10-30: qty 30, 30d supply, fill #0

## 2020-10-30 MED ORDER — GLYCOPYRROLATE 1 MG PO TABS
1.0000 mg | ORAL_TABLET | Freq: Three times a day (TID) | ORAL | 0 refills | Status: DC
  Filled 2020-10-30: qty 90, 30d supply, fill #0

## 2020-10-30 MED ORDER — PANTOPRAZOLE SODIUM 40 MG PO TBEC
40.0000 mg | DELAYED_RELEASE_TABLET | Freq: Every day | ORAL | Status: DC
  Administered 2020-10-31: 40 mg via ORAL
  Filled 2020-10-30: qty 1

## 2020-10-30 MED ORDER — ALBUTEROL SULFATE HFA 108 (90 BASE) MCG/ACT IN AERS
1.0000 | INHALATION_SPRAY | RESPIRATORY_TRACT | 11 refills | Status: DC | PRN
  Filled 2020-10-30: qty 8.5, 17d supply, fill #0

## 2020-10-30 MED ORDER — ALBUTEROL SULFATE HFA 108 (90 BASE) MCG/ACT IN AERS
1.0000 | INHALATION_SPRAY | RESPIRATORY_TRACT | 0 refills | Status: DC | PRN
  Filled 2020-10-30: qty 6.7, 17d supply, fill #0

## 2020-10-30 MED ORDER — LIDOCAINE 5 % EX PTCH
1.0000 | MEDICATED_PATCH | CUTANEOUS | 0 refills | Status: DC
  Filled 2020-10-30: qty 30, 30d supply, fill #0

## 2020-10-30 MED ORDER — NITROGLYCERIN 0.4 MG SL SUBL
0.4000 mg | SUBLINGUAL_TABLET | SUBLINGUAL | 3 refills | Status: DC | PRN
  Filled 2020-10-30: qty 25, 7d supply, fill #0

## 2020-10-30 MED ORDER — FOLIC ACID 1 MG PO TABS
1.0000 mg | ORAL_TABLET | Freq: Every day | ORAL | 0 refills | Status: DC
  Filled 2020-10-30: qty 30, 30d supply, fill #0

## 2020-10-30 NOTE — Progress Notes (Signed)
Patient is refusing bladder scan procedure.uncooperative and irritated stating to leave him alone. didn't want anyone to do anything when it came to basic care or catherization. Staff persistence and determination to assist this patient with I/O cath. Convinced him to allow staff to  be performed resulting in 300-350 of yellow urine obtained,continue to refused PVR to be done .Administered prn medication for c/o lower back pain,made as comfortable as possible ,monitor

## 2020-10-30 NOTE — Progress Notes (Addendum)
NAME:  Margaret Staggs, MRN:  [id number], DOB:  06/18/1965, LOS: 21 ADMISSION DATE:  10/09/2020, CONSULTATION DATE:  6/3 REFERRING MD:  Riley Kill, CHIEF COMPLAINT:  Trach management and COPD  History of Present Illness:  55 year old male who we follow in clinic for GOLD IV COPD (FEV1 27%). Just discharged from [facility] 5/24 to in-patient rehab after he sustained multiple traumatic injuries from a fall on 4/1 (C7 transverse process fx, 3 column T8 fracture, T7 neuro arch fracture, multiple rib fractures w/ hemo-pneumothorax requiring chest tube). This resulted in prolonged need for mechanical ventilation w/ hosp course c/b difficulty weaning, encephalopathy and ultimately trach/peg.   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  Most recent hospital course also complicated by agitated delirium   Significant Hospital Events: Including procedures, antibiotic start and stop dates in addition to other pertinent events   5/24 admitted to in-pt rehab. At time of admit was using PMV and tolerating diet. Had cuffless trach (changed from cuff on 5/20) 5/24 - 6/3 on 5/28 sats low req increased oxygen needing to go back on ATC. Started on augmentin 5/29 for tracheobronchitis. 6/1 seen by neuro-psych for depression and anxiety. Working w/ PT and OT. Tolerating rehab efforts. Trach team asked to see on 6/3 trach was supposed to be change to 4 but was changed to 6 instead.  6/5 had to be emergently suctioned. Had marked distress w/ increased oxygen needs.  6/6 asked ENT to see. 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 rate 18, height 5\' 8"  (1.727 m), weight 81.9 kg, SpO2 94 %.    FiO2 (%):  [28 %] 28 %   Intake/Output Summary (Last 24 hours) at 10/30/2020 0904 Last data filed at 10/30/2020 11/01/2020 Gross per 24 hour  Intake 1020 ml  Output 1700 ml  Net -680 ml   Filed Weights   10/09/20 1640 10/14/20 1410  Weight: 80.5 kg 81.9 kg    Examination:  General:-year-old male appears older than stated age HEENT: #6 cuffless trach is in place Neuro: Strange affect.  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 Does have subglottic granulation tissue which is the reason he is not a candidate for decannulation at this point and will need subglottic dilation before this can be considered.   Plan Continue current tracheostomy.  Will need outpatient dilatations per ENT.  Severe underlying COPD is noted Follow-up with trach clinic To be cared for  by his sister who is his caretaker prior to that admission and well-known to our practice. He has a potential for multiple complications after discharge. PEG  tube will be left in place at this time.      Brett Canales Minor ACNP Acute Care Nurse Practitioner [facility] Please consult  Amion 10/30/2020, 9:05 AM     Attending attestation:  Mr. Livesay was seen and examined in PT. He complains of some ongoing SOB. PER PT & RN, he has been more tachycardic today and they have been adjusting his activity. He complains of SOB and chest tightness that is different than his usual COPD symptoms, but has circumferential rather than substernal chest tightness.   BP 133/87 (BP Location: Left Arm)   Pulse 97   Temp 97.8 F (36.6 C) (Oral)   Resp (!) 22   Ht 5\' 8"  (1.727 m)   Wt 81.9 kg   SpO2 95%   BMI 27.45 kg/m  Chronically ill appearing man sitting in WC in NAD, back brace in place [place]/AT, eyes anicteric Trach in place, no bleeding or drainage. Tachypnea, purse lipped breathing still. Minimal air movement bilaterally but no wheezing.  Abd soft, NT Skin warm, dry, no rashes No significant LE edema, no cyanosis Awake, alert, answering questions appropriately, moving all extremities.   Assessment & plan:  SOB, CP, tachycardia -Discussed with primary team: recommend EKG & troponin. If unrevealing, CTA.  At this point, need to check CTA given worsened SOB, tachycardia, normal CXR. EKG> reviewed, nonspecific ST changes without TWI -troponin pending -CTA pending -If CTA unrevealing can treat as COPD AE> steroids, increased BD  Severe COPD, baseline FEV1 27% predicted -will need all nebulized BDs via TC until decannulated from trach.  -Recommend duonebs Q4h while awake. -Brovana BID and Yupelri daily are long acting LABA & LAMA nebulized options that would work if insurance coverage was available.  Subglottic stenosis, tracheostomy dependent -needs ENT follow up as an outpatient; cannot be decannulated until this is addressed -trach care per his sister at home; con't routine trach care here  Discussed plan with PA Angiulli, who will follow up on testing today.  , DO 10/30/20 12:18 PM [facility]

## 2020-10-30 NOTE — Progress Notes (Signed)
Physical Therapy Session Note  Patient Details  Name: Charles Weeks MRN: [id number] Date of Birth: May 25, 1965  Today's Date: 10/30/2020 PT Individual Time: 1010-1107 PT Individual Time Calculation (min): 57 min   Short Term Goals: Week 2:  PT Short Term Goal 1 (Week 2): STGs = LTGs  Skilled Therapeutic Interventions/Progress Updates:    Pt received sitting on EOB with TLSO in place wearing PMSV on 5L of O2 FiO2 28% via trach collar and on continuous HR/SpO2 monitor showing 113bpm and 92% at rest. Pt agreeable to therapy session. Maintained on 6L of O2 via trach collar on portable tank. Pt reports that he started experiencing "tightness" in his chest with increased SOB during therapies yesterday and that he is still experiencing this today. Pt also reports he feels he is having a harder time "getting the air in." [place], [place] notified and present to assess patient then RN and PT notified [name], [place] of pt's symptoms and he cleared pt to participate in therapy session while monitoring symptoms and vitals. L stand pivot EOB>w/c, no AD, with supervision. Transported to/from gym in w/c for time management and energy conservation. Gait training ~125ft, 146ft 2x, no AD, with close supervision/CGA for safety with vitals monitored throughout:  After 1st gait trial: HR 115bpm and SpO2 93% After 2nd: HR 124bpm and SpO2 85% recovering >90% in *of note: pt was requiring removal of PMSV at end of ambulation during seated rest break, to allow recovery of breathing as pt continued to report difficulty getting air in After 3rd walk: HR 118bpm and SpO2 90%, recovering to 96% and 105bpm in 1-2 minutes Pulmonology DO in/out to assess patient and made aware of pt's reported symptoms noted above.   Transported pt back to room and requesting to return to bed. Stand pivot w/c>EOB with supervision. RT and SLP present to assume care of patient. Left sitting on EOB with TLSO donned, 5L of O2 FiO2 28% on wall oxygen,  lines intact, and needs in reach.   Therapy Documentation Precautions:  Precautions Precautions: Back Precaution Booklet Issued: Yes (comment) Precaution Comments: trach collar, TLSO don with rolling if HOB >30,  abdominal binder to protect PEG, c-collar d/c'ed 5/24 Required Braces or Orthoses: Spinal Brace Cervical Brace: Hard collar, At all times Spinal Brace: Thoracolumbosacral orthotic, Applied in supine position Restrictions Weight Bearing Restrictions: No   Pain:  Reports "tightness" in his chest - notified RN and PA with details above.    Therapy/Group: Individual Therapy  Ginny Forth , PT, DPT, CSRS 10/30/2020, 7:54 AM

## 2020-10-30 NOTE — Progress Notes (Signed)
PA Dan notified of pt having chest pain with exertion. No new orders at this time.  Mylo Red, LPN

## 2020-10-30 NOTE — Progress Notes (Signed)
Speech Language Pathology Discharge Summary  Patient Details  Name: Charles Weeks MRN: [id number] Date of Birth: Aug 15, 1965   Patient has met 6 of 7 long term goals.  Patient to discharge at overall Supervision level.   Reasons goals not met: Patient requires overall Min A multidmodal cues for use of speech intelligibiliy strategies at the phrase and sentence level   Clinical Impression/Discharge Summary: Patient has made functional but inconsistent gains and has met 6 of 7 LTGs this admission. Currently, patient is tolerating his PMSV during all waking hours but requires overall Mod-Max A mutlimodal cues for use of speech intelligibility strategies to maximize intelligibility at the phrase level due to muscle tension dysphonia (per ENT). Patient is consuming regular textures with thin liquids with minimal overt s/s of aspiration and is overall Supervision-Mod I for use of swallowing compensatory strategies. Patient demonstrates behaviors consistent with a Rancho Level VII-VII and requires overall supervision level verbal cues for attention to tasks, recall of daily information, functional problem solving and emergent awareness. Patient and family education is complete and patient will discharge home with 24 hour supervision from family. Patient would benefit from f/u SLP services to maximize his cognitive, speech and swallowing function in order to reduce caregiver burden.   Care Partner:  Caregiver Able to Provide Assistance: Yes  Type of Caregiver Assistance: Physical;Cognitive  Recommendation:  Outpatient SLP;24 hour supervision/assistance  Rationale for SLP Follow Up: Reduce caregiver burden;Maximize cognitive function and independence;Maximize functional communication;Maximize swallowing safety   Equipment: Suction, PMSV   Reasons for discharge: Discharged from hospital;Treatment goals met   Patient/Family Agrees with Progress Made and Goals Achieved: Yes    Ellanor Feuerstein,  [place] 10/30/2020, 6:46 AM

## 2020-10-30 NOTE — Progress Notes (Signed)
Occupational Therapy Note  Patient Details  Name: Charles Weeks MRN: [id number] Date of Birth: [birth year]  Today's Date: 10/30/2020 OT Missed Time: 60 Minutes Missed Time Reason: Other (comment)  Patient being taken off the unit for chest CT 2/2 onset of chest pain. Pt not appropriate or available for OT treatment at this time. OT to follow up per plan of care pending medical appropriateness.   Merlene Laughter Kieanna Rollo 10/30/2020, 2:29 PM

## 2020-10-30 NOTE — Progress Notes (Signed)
Speech Language Pathology TBI Note  Patient Details  Name: Charles Weeks MRN: [id number] Date of Birth: 1965/11/07  Today's Date: 10/30/2020 SLP Individual Time: 1115-1130 SLP Individual Time Calculation (min): 15 min and Today's Date: 10/30/2020 SLP Missed Time: 15 Minutes Missed Time Reason: Nursing care;Unavailable (Comment) (RT and EKG due to shortness of breath)  Short Term Goals: Week 3: SLP Short Term Goal 1 (Week 3): STGs=LTGs due to ELOS  Skilled Therapeutic Interventions: Skilled treatment session focused on speech goals. Patient received from PT with RT present. Patient with reports of shortness of breath and PMSV was removed. RT provided deep suctioning. Patient requested to keep the PMSV off due to increased comfort. SLP educated patient on how to independently donn/doff PMSV. With extra time, patient could donn the PMSV safely but required Max verbal cues for doffing PMSV due to attempting to remove while only utilizing one hand despite cues. The plastic tether was attached to the PMSV and was placed around the trach collar for ease of use. When PMSV was donned, patient was essentially aphonic. Session ended early due to EKG. Patient left supine in bed with RN present. Continue with current plan of care.      Pain Pain Assessment Pain Scale: 0-10 Pain Score: 8  Pain Type: Acute pain Pain Location: Back Pain Orientation: Lower Pain Descriptors / Indicators: Aching Pain Frequency: Constant Pain Onset: On-going Patients Stated Pain Goal: 0 Pain Intervention(s): Medication (See eMAR)  Agitated Behavior Scale: TBI Observation Details Observation Environment: Patient's room Start of observation period - Date: 10/30/20 Start of observation period - Time: 1115 End of observation period - Date: 10/30/20 End of observation period - Time: 1130 Agitated Behavior Scale (DO NOT LEAVE BLANKS) Short attention span, easy distractibility, inability to concentrate: Present to a  slight degree Impulsive, impatient, low tolerance for pain or frustration: Absent Uncooperative, resistant to care, demanding: Absent Violent and/or threatening violence toward people or property: Absent Explosive and/or unpredictable anger: Absent Rocking, rubbing, moaning, or other self-stimulating behavior: Absent Pulling at tubes, restraints, etc.: Absent Wandering from treatment areas: Absent Restlessness, pacing, excessive movement: Absent Repetitive behaviors, motor, and/or verbal: Absent Rapid, loud, or excessive talking: Absent Sudden changes of mood: Absent Easily initiated or excessive crying and/or laughter: Absent Self-abusiveness, physical and/or verbal: Absent Agitated behavior scale total score: 15  Therapy/Group: Individual Therapy  Fatisha Rabalais 10/30/2020, 1:34 PM

## 2020-10-30 NOTE — Progress Notes (Signed)
Pt had complaints of shortness of breath and chest pain. Pt alert and oriented. MD made aware. CT of Chest and troponins to rule out cardiac source of pain. IV placed for CT. Pt is currently on 1L of O2. Charge nurse aware and patient continuing to be monitored.

## 2020-10-30 NOTE — Progress Notes (Signed)
PROGRESS NOTE   Subjective/Complaints:  Pt aware of planned d/c in am  Discussed pulm issues with CCM NP  ROS:   Pt denies SOB, abd pain, CP, N/V/C/D, and vision changes, PEG is bothersome to him   Objective:   DG CHEST PORT 1 VIEW  Result Date: 10/29/2020 CLINICAL DATA:  Short of breath EXAM: PORTABLE CHEST 1 VIEW COMPARISON:  Portable exam 1511 hours compared to 10/14/2020 FINDINGS: Tracheostomy tube stable. Normal heart size, mediastinal contours, and pulmonary vascularity. Minimal scarring at RIGHT base. Remaining lungs clear. No acute infiltrate, pleural effusion, or pneumothorax. Osseous structures unremarkable. IMPRESSION: RIGHT basilar scarring. No acute abnormalities. Electronically Signed   By: Ulyses Southward M.D.   On: 10/29/2020 15:20   No results for input(s): WBC, HGB, HCT, PLT in the last 72 hours. No results for input(s): NA, K, CL, CO2, GLUCOSE, BUN, CREATININE, CALCIUM in the last 72 hours.  Intake/Output Summary (Last 24 hours) at 10/30/2020 0820 Last data filed at 10/30/2020 0809 Gross per 24 hour  Intake 900 ml  Output 1700 ml  Net -800 ml         Physical Exam: Vital Signs Blood pressure 133/87, pulse 74, temperature 97.8 F (36.6 C), temperature source Oral, resp. rate 18, height 5\' 8"  (1.727 m), weight 81.9 kg, SpO2 98 %.   General: No acute distress Mood and affect are appropriate Heart: Regular rate and rhythm no rubs murmurs or extra sounds Lungs: Clear to auscultation, breathing unlabored, no rales or wheezes Abdomen: Positive bowel sounds, soft nontender to palpation, nondistended Extremities: No clubbing, cyanosis, or edema Skin: No evidence of breakdown, no evidence of rash   Skin: scattered abrasions. See above Neurologic:  alert, oriented. motor strength is 3- Right and 4 /5 Left  deltoid, 4/5 bilateral bicep, tricep, grip, hip flexor, knee extensors, ankle dorsiflexor and plantar  flexor. Motor stable. Sensory exam unremarkable  Musculoskeletal: some pain with PROM RIght shoulder abd/flex/IR/ER --no change   Assessment/Plan: 1. Functional deficits which require 3+ hours per day of interdisciplinary therapy in a comprehensive inpatient rehab setting. Physiatrist is providing close team supervision and 24 hour management of active medical problems listed below. Physiatrist and rehab team continue to assess barriers to discharge/monitor patient progress toward functional and medical goals  Care Tool:  Bathing    Body parts bathed by patient: Chest, Left arm, Right arm, Abdomen   Body parts bathed by helper: Left lower leg, Right lower leg     Bathing assist Assist Level: Minimal Assistance - Patient > 75%     Upper Body Dressing/Undressing Upper body dressing   What is the patient wearing?: Pull over shirt, Orthosis    Upper body assist Assist Level: Moderate Assistance - Patient 50 - 74%    Lower Body Dressing/Undressing Lower body dressing      What is the patient wearing?: Underwear/pull up, Pants     Lower body assist Assist for lower body dressing: Minimal Assistance - Patient > 75%     Toileting Toileting    Toileting assist Assist for toileting: Set up assist     Transfers Chair/bed transfer  Transfers assist     Chair/bed transfer assist  level: Contact Guard/Touching assist     Locomotion Ambulation   Ambulation assist      Assist level: Contact Guard/Touching assist Assistive device: No Device Max distance: 159ft   Walk 10 feet activity   Assist     Assist level: Contact Guard/Touching assist Assistive device: No Device   Walk 50 feet activity   Assist    Assist level: Contact Guard/Touching assist Assistive device: No Device    Walk 150 feet activity   Assist Walk 150 feet activity did not occur: Safety/medical concerns  Assist level: Contact Guard/Touching assist Assistive device: No Device     Walk 10 feet on uneven surface  activity   Assist Walk 10 feet on uneven surfaces activity did not occur: Safety/medical concerns         Wheelchair     Assist Will patient use wheelchair at discharge?: No             Wheelchair 50 feet with 2 turns activity    Assist            Wheelchair 150 feet activity     Assist          Blood pressure 133/87, pulse 74, temperature 97.8 F (36.6 C), temperature source Oral, resp. rate 18, height 5\' 8"  (1.727 m), weight 81.9 kg, SpO2 98 %.    Medical Problem List and Plan: 1.  Debility secondary to fall 08/17/20 with 3 column T8 vertebral body fracture/T7 neural arch fracture/right TVP T4-8/right C7 transverse process fracture through the foramen.  CTO to be applied in supine position             -patient may shower with CTO             -ELOS/Goals:10/31/20- supervision to min A            -Continue PT, OT, and SLP/CIR   2.  Impaired mobility -DVT/anticoagulation: Continue Lovenox.                -vascular study negative             -antiplatelet therapy: Aspirin 81 mg daily 3. Pain Management: Continue Oxycodone as needed             -?mild neuropathic pain RUE. 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 Seems tolerable  -6/13-continues to require a lot of oxycodone, provide counseling.  4. Mood: Continue Wellbutrin 300 mg daily, BuSpar 10 mg 3 times daily, Klonopin 0.5 mg nightly,              -antipsychotic agents: Seroquel 150 mg twice daily and 300 mg nightly             -behavior has been appropriate 5. Neuropsych: This patient is not capable of making decisions on his own behalf. 6. Skin/Wound Care: Routine skin checks 7. Fluids/Electrolytes/Nutrition:                -recent lab work is within normal limits.             -has good appetite 8.  Acute blood loss anemia.  Follow-up CBC 9.  Multiple bilateral rib  fractures.  Conservative care 10.  3: T8 vertebral body fracture.  Follow-up neurosurgery Dr. 11/02/20.  Nonoperative.               -CTO for HOB>30 degrees at this point-  Injury 08/17/20  -  nipple pain is from contact with CTO--- tape nipples, lanolin oil   6/12- stable with therapy- con't regimen 11.  T7 neural arch fracture.  Follow-up neurosurgery 12.  Right TVP T4-8.  Pain control follow neurosurgery 13.  Right C7 TVP fracture through foramen.  CTA negative.  CTO 14.  Large scalp laceration right eyebrow laceration.  Repaired 08/17/2020.  Sutures removed 15.  Hypertension.  Lopressor 25 mg twice daily 16.  CAD/MI/DES.  Continue aspirin 17.  Acute hypercarbic ventilatory dependent respiratory failure/COPD.  Tracheostomy 09/03/2020 per Dr. Janee Morn.  Changed back to #6 cuffless 10/05/2020 due to secretions.  Patient will follow up long-term with Dr. Sherene Sires           CBC CXR unremarkable   -6/10   -off abx, on 5L O2   -continue trach mgt, breathing treatments etc    ENT consulted. 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 Probably will do as outpt once pt is out of CTO. F/u with NS regarding brace timing    -decrease prednisone to 5mg  qd today   -continue above nebs, inhalers, O2   -appreciate PCCCM follow up   -continue #6 with PMV only  6/11- no change since yesterday- con't #6 Shiley and PMV- will not cap pt.   6/12- sounds medically OK- pt frustrated cannot get it removed, but re-explained why- con't regimen 6/14- discussed with CCM NP who confirmed trach will stay in for D/C- no downsize    18.  Dysphagia.  Gastrostomy tube 09/03/2020 per Dr. 09/05/2020.               -Tolerating Dysphagia #3 thin liquids--advance per SLP  6/10 recommended that pt keep in PEG given potential tracheal/pulmonary interventions after discharg 19.  BPH/urine retention:               -oob to void for all attempts   6/10 pt on bid flomax, 50mg  qid urecholine as well as hytrin   -almost always having to be cathed   -pt will need to decide if he wants his wife to catheterize him at home vs placement of foley.    -will request nurses begin education re caths   -regardless he will need outpt urology consult 20.  History of alcohol tobacco abuse.  Provide counseling 21. Constipation---+  6/11- LBM yesterday- documented in chart- con't to monitor    LOS: 21 days A FACE TO FACE EVALUATION WAS PERFORMED  10/30/2020, 8:20 AM

## 2020-10-30 NOTE — Progress Notes (Signed)
Occupational Therapy Discharge Summary  Patient Details  Name: Fulton Merry MRN: [id number] Date of Birth: 05/05/1966  Patient has met 8 of 9 long term goals due to improved activity tolerance, improved balance, postural control, ability to compensate for deficits, functional use of  RIGHT upper extremity, improved attention, improved awareness, and improved coordination.  Patient to discharge at overall Supervision/min A level.  Patient's care partner is independent to provide the necessary physical assistance at discharge.    Reasons goals not met: Pt still needs min A for UB ADLs as he has to do these at bed level 2/2 TLSO being donned in supine  Recommendation:  Patient will benefit from ongoing skilled OT services in home health setting to continue to advance functional skills in the area of BADL.  Equipment: 3-in-1 [name], RW  Reasons for discharge: treatment goals met and discharge from hospital  Patient/family agrees with progress made and goals achieved: Yes  OT Discharge Precautions/Restrictions  Precautions Precautions: Back Precaution Comments: HOB < 30 degrees, TLSO donned if HOB elevated Required Braces or Orthoses: Spinal Brace Spinal Brace: Thoracolumbosacral orthotic;Applied in supine position Restrictions Weight Bearing Restrictions: No General OT Amount of Missed Time: 60 Minutes ADL ADL Eating: Supervision/safety Where Assessed-Eating: Bed level Grooming: Independent Where Assessed-Grooming: Bed level Upper Body Bathing: Setup Where Assessed-Upper Body Bathing: Bed level Lower Body Bathing: Supervision/safety Where Assessed-Lower Body Bathing: Edge of bed Upper Body Dressing: Minimal assistance Lower Body Dressing: Supervision/safety Toileting: Supervision/safety Where Assessed-Toileting: Glass blower/designer: Close supervision Toilet Transfer Method: Ambulating Tub/Shower Transfer: Unable to assess [employer] Transfer: Minimal  assistance  Cognition Overall Cognitive Status: Impaired/Different from baseline Arousal/Alertness: Awake/alert Orientation Level: Oriented X4 Attention: Sustained Sustained Attention: Impaired Sustained Attention Impairment: Verbal complex;Functional basic Memory: Impaired Memory Impairment: Storage deficit;Decreased short term memory;Decreased recall of new information Awareness: Impaired Awareness Impairment: Emergent impairment Problem Solving: Impaired Problem Solving Impairment: Verbal complex Safety/Judgment: Impaired [employer] Scales of Cognitive Functioning: Purposeful/appropriate Sensation Sensation Light Touch: Appears Intact Hot/Cold: Appears Intact Coordination Gross Motor Movements are Fluid and Coordinated: No Fine Motor Movements are Fluid and Coordinated: Yes Coordination and Movement Description: generalized weakness Finger Nose Finger Test: [facility] Motor  Motor Motor - Discharge Observations: Generalized weakness and deconditioning, improved since eval Mobility  Bed Mobility Supine to Sit: Supervision/Verbal cueing Sit to Supine: Supervision/Verbal cueing Transfers Sit to Stand: Supervision/Verbal cueing Stand to Sit: Supervision/Verbal cueing  Balance Balance Balance Assessed: Yes Static Sitting Balance Static Sitting - Balance Support: No upper extremity supported Static Sitting - Level of Assistance: 6: Modified independent (Device/Increase time) Dynamic Sitting Balance Dynamic Sitting - Balance Support: During functional activity Dynamic Sitting - Level of Assistance: 6: Modified independent (Device/Increase time) Static Standing Balance Static Standing - Balance Support: During functional activity Static Standing - Level of Assistance: 5: Stand by assistance Dynamic Standing Balance Dynamic Standing - Balance Support: During functional activity Dynamic Standing - Level of Assistance: 5: Stand by assistance Extremity/Trunk Assessment RUE  Assessment RUE Assessment: Exceptions to [facility] General Strength Comments: R shoulder flexion limited to 40 degrees. Elbow AROM WFL, hand normal LUE Assessment LUE Assessment: Within Functional Limits   Valma Cava 10/30/2020, 2:36 PM

## 2020-10-30 NOTE — Patient Care Conference (Addendum)
Inpatient RehabilitationTeam Conference and Plan of Care Update Date: 10/30/2020   Time: 10:39 AM    Patient Name: Charles Weeks      Medical Record Number: [id number]  Date of Birth: [birth year] Sex: Male         Room/Bed: 4W07C/4W07C-01 Payor Info: Payor: [place] MEDICAID PREPAID HEALTH PLAN / Plan: [place] MEDICAID [employer] / Product Type: *No Product type* /    Admit Date/Time:  10/09/2020  4:38 PM  Primary Diagnosis:  Thoracic spine fracture [facility])  Hospital Problems: Principal Problem:   Thoracic spine fracture [facility]) Active Problems:   Urinary retention due to benign prostatic hyperplasia   Tracheostomy in place [facility])   Depression with anxiety   Subglottic stenosis    Expected Discharge Date: Expected Discharge Date: 10/31/20  Team Members Present: Physician leading conference: Dr. Sula Soda Care Coodinator Present: Lavera Guise, BSW;Opha Mcghee Marlyne Beards, RN, BSN, CRRN Nurse Present: Kennyth Arnold, RN PT Present: Malachi Pro, PT OT Present: Kearney Hard, OT SLP Present: Feliberto Gottron, SLP PPS Coordinator present : Edson Snowball, PT     Current Status/Progress Goal Weekly Team Focus  Bowel/Bladder   Continent of Bowel, occassional episodes where he is continent of bladder otherwise I/O ccath Q4-6 hrs, LBM 10/29/20  continue remain continent of  bowel, assess for bladder distention and maintain I &O catheter.  Contnue to assess with f/u of toileting needs and changes, has schedule times for I/O cath attempt to have him void before catherization   Swallow/Nutrition/ Hydration   Regular texturs with thin liquids, Intermittent supervision-Mod I  Mod I  Education   ADL's   Supervision/CGA  Supervision  dc planning, pt/family education, self-care retraining   Mobility   supervision transfers, CGA ambulation, CGA 12 steps with BHRs  Supervision  family ed, DC prep   Communication   #6 cuffless trach with PMSV during all waking hours, Min-Mod A  Mod I   Education   Safety/Cognition/ Behavioral Observations  Supervision  Supervision  Education   Pain   Pain verbalized 7-8/10 on pain scale for pain in back and all over PRN administered with follow up  Pain level less then 2  Assess QS.PRN with f/u   Skin   Skin intact, Trach without complications or irritation Gastrotomy tube left abdomen intact small amount drainage/redness site  remain skin dry and intact  Skin assess QS/PRN     Discharge Planning:  D/c to home with 24/7 care from his sister. Sister is willing to manage trach at d/c. FAm edu on 6/14 1:30pm-3:30pm with therapy team and 3:30pm with RT-pt sister to be present for both.   Team Discussion: Not downsizing trach, peg is not mature so can't discontinue. Patient can be continent B/B. He needs to be taken to the toilet to void. He is able to void on his own if he is gotten out of bed and taken into the bathroom to void. Complains of constant pain to the lower back.  Patient on target to meet rehab goals: yes, family education is tomorrow. Supervision for mobility. Supervision for ADL's. Mod I for swallowing, communication, and supervision for safety.  *See Care Plan and progress notes for long and short-term goals.   Revisions to Treatment Plan:  MD continuing medications for outpatient follow up.  Teaching Needs: Family education tomorrow, trach education, Lovenox teaching.  Current Barriers to Discharge: Decreased caregiver support, Medical stability, Home enviroment access/layout, Trach, Incontinence, Lack of/limited family support, Weight bearing restrictions, Medication compliance, Behavior, and Nutritional means  Possible Resolutions to Barriers: Continue current medications, provide emotional support.     Medical Summary Current Status: Continent x2 but with BPH and urinary retention, constant low back pain, PEG and trach in place, constipation  Barriers to Discharge: Medical stability;Wound care  Barriers to  Discharge Comments: Continent x2 but with BPH and urinary retention, constant low back pain, PEG and trach in place, constipation Possible Resolutions to [employer] Focus: outpatient urology f/u has been scheduled, continue flomax/urecholine hytrix, continue current pain regimen/spinal brace, PEG and trach removal outpatient   Continued Need for Acute Rehabilitation Level of Care: The patient requires daily medical management by a physician with specialized training in physical medicine and rehabilitation for the following reasons: Direction of a multidisciplinary physical rehabilitation program to maximize functional independence : Yes Medical management of patient stability for increased activity during participation in an intensive rehabilitation regime.: Yes Analysis of laboratory values and/or radiology reports with any subsequent need for medication adjustment and/or medical intervention. : Yes   I attest that I was present, lead the team conference, and concur with the assessment and plan of the team.   Tennis Must 10/30/2020, 5:08 PM

## 2020-10-30 NOTE — Progress Notes (Signed)
Sister and niece at bedside.  Assisted another RT in educating for trach care.  Family and patient were given folders with written education also.  They assured [place] that they were very familiar and comfortable taking care of trach.  We demonstrated suctioning and changing the inner cannula.

## 2020-10-30 NOTE — Progress Notes (Addendum)
Family present at this time for cathing and trach care education. Sister refusing to do therapy education tomorrow as scheduled. The pt and family also refuse for the sister and daughter to be involved in the catheterization education. Sister stated she is "uncomfortable cathing him but will cath him in an emergency situation." Sister also refuses to cath pt at home at this time. Daughter stated "I know how to do it" Family stepped out at this time. Pt educated on self cathing. Pt self cathed at this time with use of self cath mirror. Will repeat education when next cath is due. No further questions from pt/family at this time. Sister wants trach care education to be done for the daughter. Sister feels ready to do trach care if needed. Sister also stated "he will take care of himself at home and she will intervene if there is an emergency." Respiratory in room at this time to educate daughter on trach care with sister also present. Self cath education materials given, Mylo Red, LPN

## 2020-10-30 NOTE — Progress Notes (Signed)
Patient ID: Charles Weeks, male   DOB: 1965/08/19, 55 y.o.   MRN: [id number] Team Conference Report to Patient/Family  Team Conference discussion was reviewed with the patient and caregiver, including goals, any changes in plan of care and target discharge date.  Patient and caregiver express understanding and are in agreement.  The patient has a target discharge date of 10/31/20.  Covering for primary SW, Auria   SW met with patient, introduced self, provided patient with updates. Patient d/c tomorrow.   Dyanne Iha 10/30/2020, 1:58 PM

## 2020-10-30 NOTE — Progress Notes (Deleted)
Speech Language Pathology Daily Session Note  Patient Details  Name: Charles Weeks MRN: [id number] Date of Birth: 07/03/1965  Today's Date: 10/30/2020 SLP Individual Time: 1115-1130 SLP Individual Time Calculation (min): 15 min and Today's Date: 10/30/2020 SLP Missed Time: 15 Minutes Missed Time Reason: Nursing care;Unavailable (Comment) (RT and EKG due to shortness of breath)  Short Term Goals: Week 3: SLP Short Term Goal 1 (Week 3): STGs=LTGs due to ELOS  Skilled Therapeutic Interventions: Skilled treatment session focused on speech goals. Patient received from PT with RT present. Patient with reports of shortness of breath and PMSV was removed. RT provided deep suctioning. Patient requested to keep the PMSV off due to increased comfort. SLP educated patient on how to independently donn/doff PMSV. With extra time, patient could donn the PMSV safely but required Max verbal cues for doffing PMSV due to attempting to remove while only utilizing one hand despite cues. The plastic tether was attached to the PMSV and was placed around the trach collar for ease of use. When PMSV was donned, patient was essentially aphonic. Session ended early due to EKG. Patient left supine in bed with RN present. Continue with current plan of care.      Pain Pain Assessment Pain Scale: 0-10 Pain Score: 8  Pain Type: Acute pain Pain Location: Back Pain Orientation: Lower Pain Descriptors / Indicators: Aching Pain Frequency: Constant Pain Onset: On-going Patients Stated Pain Goal: 0 Pain Intervention(s): Medication (See eMAR)  Therapy/Group: Individual Therapy  Vickye Astorino 10/30/2020, 1:25 PM

## 2020-10-31 ENCOUNTER — Other Ambulatory Visit (HOSPITAL_COMMUNITY): Payer: Self-pay

## 2020-10-31 DIAGNOSIS — J386 Stenosis of larynx: Secondary | ICD-10-CM

## 2020-10-31 MED ORDER — QUETIAPINE FUMARATE 50 MG PO TABS
ORAL_TABLET | ORAL | 0 refills | Status: DC
  Filled 2020-10-31: qty 100, 8d supply, fill #0

## 2020-10-31 MED ORDER — ALBUTEROL SULFATE (2.5 MG/3ML) 0.083% IN NEBU: 2.5000 mg | INHALATION_SOLUTION | Freq: Four times a day (QID) | RESPIRATORY_TRACT | 12 refills | Status: DC | PRN

## 2020-10-31 MED ORDER — BETHANECHOL CHLORIDE 25 MG PO TABS
50.0000 mg | ORAL_TABLET | Freq: Four times a day (QID) | ORAL | 0 refills | Status: DC
  Filled 2020-10-31: qty 180, 23d supply, fill #0

## 2020-10-31 MED ORDER — DOCUSATE SODIUM 100 MG PO CAPS: 100.0000 mg | ORAL_CAPSULE | Freq: Every day | ORAL | 0 refills | Status: DC

## 2020-10-31 MED ORDER — PREDNISONE 5 MG PO TABS
5.0000 mg | ORAL_TABLET | Freq: Every day | ORAL | 0 refills | Status: DC
  Filled 2020-10-31: qty 30, 30d supply, fill #0

## 2020-10-31 MED ORDER — ADULT MULTIVITAMIN W/MINERALS CH: 1.0000 | ORAL_TABLET | Freq: Every day | ORAL | Status: DC

## 2020-10-31 MED ORDER — ASPIRIN 81 MG PO CHEW: 81.0000 mg | CHEWABLE_TABLET | Freq: Every day | ORAL | Status: AC

## 2020-10-31 NOTE — Progress Notes (Signed)
Spoke with pt sister Dina Rich regarding time of discharge, informing her of OT family education from 1-2. She stated she will not be attending education as he will need "to take care of hisself", and will be here to pick him up after.   Rayburn Ma

## 2020-10-31 NOTE — Progress Notes (Signed)
Inpatient Rehabilitation Care Coordinator Discharge Note  The overall goal for the admission was met for:   Discharge location: Yes, home   Length of Stay: Yes, 22 Days  Discharge activity level: Yes, Sup/Min A  Home/community participation: Yes  Services provided included: MD, RD, PT, OT, SLP, RN, CM, TR, Pharmacy, Neuropsych, and SW  Financial Services: Medicaid  Choices offered to/list presented to:patient and family   Follow-up services arranged: [facility]            Comments (or additional information): PT OT ST trach care kits supplies (#6 Shiley cufless), portable suction, humidification, oxygen, nebulizer machine, Trach Suction Kit, Rolling Walker , Bedside Commode  Patient/Family verbalized understanding of follow-up arrangements: Yes  Individual responsible for coordination of the follow-up plan: [name] (Sister), [phone]  Confirmed correct DME delivered:  J  10/31/2020     J  

## 2020-10-31 NOTE — Progress Notes (Signed)
PROGRESS NOTE   Subjective/Complaints:  Had questions about PEG again. Reminded him of our conversation last week. Otherwise ready to go home. Tells me he's doing caths  ROS: Patient denies fever, rash, sore throat, blurred vision, nausea, vomiting, diarrhea, cough,   chest pain,   headache, or mood change.    Objective:   CT Angio Chest Pulmonary Embolism (PE) W or WO Contrast  Result Date: 10/30/2020 CLINICAL DATA:  Shortness of breath EXAM: CT ANGIOGRAPHY CHEST WITH CONTRAST TECHNIQUE: Multidetector CT imaging of the chest was performed using the standard protocol during bolus administration of intravenous contrast. Multiplanar CT image reconstructions and MIPs were obtained to evaluate the vascular anatomy. CONTRAST:  33mL OMNIPAQUE IOHEXOL 350 MG/ML SOLN COMPARISON:  Chest radiograph October 29, 2020; chest CT August 17, 2020 FINDINGS: Cardiovascular: There is no appreciable pulmonary embolus. There is no thoracic aortic aneurysm or dissection. Visualized great vessels appear normal. There is no appreciable pericardial effusion or pericardial thickening. There are foci of coronary artery calcification. Mediastinum/Nodes: Thyroid appears unremarkable. No evident thoracic adenopathy. No esophageal lesion is evident. Lungs/Pleura: Tracheostomy present with tracheostomy catheter tip in mid trachea. There is underlying centrilobular emphysematous change. There are areas of atelectatic change in each upper lobe posteriorly as well as each lung base region. On axial slice 123 series 6, there is a nodular opacity in the lateral segment of the right lower lobe measuring 1.2 x 0.8 x 0.8 cm. This opacity is also seen on coronal slice 74 series 8 and sagittal slice 24 series 9. No similar nodular opacities elsewhere. No evident pleural effusions. No appreciable pneumothorax. Trachea and major bronchial structures appear patent. Upper Abdomen: There is upper  abdominal aortic atherosclerosis. Visualized upper abdominal structures otherwise appear unremarkable. Musculoskeletal: There are fractures of the T8 and T9 vertebral bodies with collapse of the disc space in this area and bony remodeling. There is localized kyphosis in this area. Other bony structures appear unremarkable. No chest wall lesions. Review of the MIP images confirms the above findings. IMPRESSION: 1. No appreciable pulmonary embolus. No thoracic aortic aneurysm or dissection. 2. Nodular opacity apical segment right lower lobe measuring 1.2 x 0.8 x 0.8 cm. Consider one of the following in 3 months for both low-risk and high-risk individuals: (a) repeat chest CT, (b) follow-up PET-CT, or (c) tissue sampling. This recommendation follows the consensus statement: Guidelines for Management of Incidental Pulmonary Nodules Detected on CT Images: From the Fleischner Society 2017; Radiology 2017; 284:228-243. 3. No edema or airspace opacity. Underlying emphysematous change. Tracheostomy present. No pneumothorax. 4.  No evident adenopathy. 5. Fractures of the T8 and T9 vertebral body with localized kyphosis in this area. End plate irregularity noted in this area. Note that if there is any clinical suspicion for potential discitis in this area, MR pre and post-contrast would be the optimum study of choice to further evaluate. Emphysema (ICD10-J43.9). Electronically Signed   By: Bretta Bang III M.D.   On: 10/30/2020 14:46   DG CHEST PORT 1 VIEW  Result Date: 10/29/2020 CLINICAL DATA:  Short of breath EXAM: PORTABLE CHEST 1 VIEW COMPARISON:  Portable exam 1511 hours compared to 10/14/2020 FINDINGS: Tracheostomy tube stable. Normal  heart size, mediastinal contours, and pulmonary vascularity. Minimal scarring at RIGHT base. Remaining lungs clear. No acute infiltrate, pleural effusion, or pneumothorax. Osseous structures unremarkable. IMPRESSION: RIGHT basilar scarring. No acute abnormalities. Electronically  Signed   By: Ulyses Southward M.D.   On: 10/29/2020 15:20   No results for input(s): WBC, HGB, HCT, PLT in the last 72 hours. No results for input(s): NA, K, CL, CO2, GLUCOSE, BUN, CREATININE, CALCIUM in the last 72 hours.  Intake/Output Summary (Last 24 hours) at 10/31/2020 0824 Last data filed at 10/31/2020 0805 Gross per 24 hour  Intake 1197 ml  Output 1550 ml  Net -353 ml        Physical Exam: Vital Signs Blood pressure (!) 149/99, pulse 76, temperature 97.7 F (36.5 C), resp. rate 19, height 5\' 8"  (1.727 m), weight 81.9 kg, SpO2 97 %.   Constitutional: No distress . Vital signs reviewed. HEENT: EOMI, oral membranes moist Neck: trach with PMV. dysphonic Cardiovascular: RRR without murmur. No JVD    Respiratory/Chest: CTA scattered upper airway sounds    GI/Abdomen: BS +, non-tender, non-distended Ext: no clubbing, cyanosis, or edema Psych: pleasant and cooperative Skin:PEG site with some drainage, scant.  Neurologic:  alert, oriented. motor strength is 3- Right and 4 /5 Left  deltoid, 4/5 bilateral bicep, tricep, grip, hip flexor, knee extensors, ankle dorsiflexor and plantar flexor. Motor stable. Sensory exam unremarkable  Musculoskeletal: some pain with PROM RIght shoulder abd/flex/IR/ER --stable   Assessment/Plan: 1. Functional deficits which require 3+ hours per day of interdisciplinary therapy in a comprehensive inpatient rehab setting. Physiatrist is providing close team supervision and 24 hour management of active medical problems listed below. Physiatrist and rehab team continue to assess barriers to discharge/monitor patient progress toward functional and medical goals  Care Tool:  Bathing    Body parts bathed by patient: Chest, Left arm, Right arm, Abdomen, Front perineal area, Right upper leg, Left upper leg, Face, Buttocks, Left lower leg, Right lower leg   Body parts bathed by helper: Left lower leg, Right lower leg     Bathing assist Assist Level:  Supervision/Verbal cueing     Upper Body Dressing/Undressing Upper body dressing   What is the patient wearing?: Pull over shirt    Upper body assist Assist Level: Minimal Assistance - Patient > 75%    Lower Body Dressing/Undressing Lower body dressing      What is the patient wearing?: Underwear/pull up, Pants     Lower body assist Assist for lower body dressing: Supervision/Verbal cueing     Toileting Toileting    Toileting assist Assist for toileting: Supervision/Verbal cueing     Transfers Chair/bed transfer  Transfers assist     Chair/bed transfer assist level: Supervision/Verbal cueing     Locomotion Ambulation   Ambulation assist      Assist level: Supervision/Verbal cueing Assistive device: No Device Max distance: 174ft   Walk 10 feet activity   Assist     Assist level: Supervision/Verbal cueing Assistive device: No Device   Walk 50 feet activity   Assist    Assist level: Contact Guard/Touching assist Assistive device: No Device    Walk 150 feet activity   Assist Walk 150 feet activity did not occur: Safety/medical concerns  Assist level: Supervision/Verbal cueing Assistive device: No Device    Walk 10 feet on uneven surface  activity   Assist Walk 10 feet on uneven surfaces activity did not occur: Safety/medical concerns         Wheelchair  Assist Will patient use wheelchair at discharge?: No             Wheelchair 50 feet with 2 turns activity    Assist            Wheelchair 150 feet activity     Assist          Blood pressure (!) 149/99, pulse 76, temperature 97.7 F (36.5 C), resp. rate 19, height 5\' 8"  (1.727 m), weight 81.9 kg, SpO2 97 %.    Medical Problem List and Plan: 1.  Debility secondary to fall 08/17/20 with 3 column T8 vertebral body fracture/T7 neural arch fracture/right TVP T4-8/right C7 transverse process fracture through the foramen.  CTO to be applied in supine  position             -patient may shower with CTO             -DC home today   2.  Impaired mobility -DVT/anticoagulation: Continue Lovenox.                -vascular study negative             -antiplatelet therapy: Aspirin 81 mg daily 3. Pain Management: Continue Oxycodone as needed             -?mild neuropathic pain RUE. 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 Seems tolerable  -6/15 told pt we would dc home on oxycodone.  4. Mood: Continue Wellbutrin 300 mg daily, BuSpar 10 mg 3 times daily, Klonopin 0.5 mg nightly,              -antipsychotic agents: Seroquel 150 mg twice daily and 300 mg nightly             -behavior has been appropriate 5. Neuropsych: This patient is not capable of making decisions on his own behalf. 6. Skin/Wound Care: Routine skin checks 7. Fluids/Electrolytes/Nutrition:                -recent lab work is within normal limits.             -has good appetite 8.  Acute blood loss anemia.  Follow-up CBC 9.  Multiple bilateral rib fractures.  Conservative care 10.  3: T8 vertebral body fracture.  Follow-up neurosurgery Dr. 7/15.  Nonoperative.               -CTO for HOB>30 degrees at this point-  Injury 08/17/20  -nipple pain is from contact with CTO--- tape nipples, lanolin oil   6/15- stable with therapy- con't regimen 11.  T7 neural arch fracture.  Follow-up neurosurgery 12.  Right TVP T4-8.  Pain control follow neurosurgery 13.  Right C7 TVP fracture through foramen.  CTA negative.  CTO 14.  Large scalp laceration right eyebrow laceration.  Repaired 08/17/2020.  Sutures removed 15.  Hypertension.  Lopressor 25 mg twice daily 16.  CAD/MI/DES.  Continue aspirin 17.  Acute hypercarbic ventilatory dependent respiratory failure/COPD.  Tracheostomy 09/03/2020 per Dr. 09/05/2020.  Changed back to #6 cuffless 10/05/2020 due to secretions.  Patient will follow up long-term with Dr. 10/07/2020            CBC CXR unremarkable   -6/10   -off abx, on 5L O2   -continue trach mgt, breathing treatments etc    ENT consulted. 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 +muscle tension dysphonia   -  needs direct laryngoscopy with dilatation in future which requires neck extension. Probably will do as outpt once pt is out of CTO. F/u with NS regarding brace timing    -decrease dprednisone to 5mg  qd today   -continue above nebs, inhalers, O2   -appreciate PCCCM follow up   -continue #6 with PMV only  6/15 outpt f/u with ENT/Pulmonary   -CTA demonstrated no acute findings   -dc home on nebs, O2, trach, meds above 18.  Dysphagia.  Gastrostomy tube 09/03/2020 per Dr. Janee Morn.               -Tolerating Dysphagia #3 thin liquids--advance per SLP  6/15 recommended that pt keep in PEG given potential tracheal/pulmonary interventions after discharg 19.  BPH/urine retention:              -oob to void for all attempts   6/10 pt on bid flomax, 50mg  qid urecholine as well as hytrin   -almost always having to be cathed   -pt will need to decide if he wants his wife to catheterize him at home vs placement of foley.    -pt able to perform caths on his own   - outpt urology consult made 20.  History of alcohol tobacco abuse.  Provide counseling 21. Constipation---+  Encouraged appropriate diet    LOS: 22 days A FACE TO FACE EVALUATION WAS PERFORMED  Ranelle Oyster 10/31/2020, 8:24 AM

## 2020-10-31 NOTE — Progress Notes (Signed)
Patient ID: Charles Weeks, male   DOB: 01/27/1966, 55 y.o.   MRN: [id number]  Covering for primary SW, Auria,  Per therapy request DME ordered:  Bedside Commode, Rolling Walker and Geophysicist/field seismologist ordered through [employer].  Patient family will arrive after 1:30 PM.  Lavera Guise, BSW [phone]

## 2020-10-31 NOTE — Progress Notes (Signed)
INPATIENT REHABILITATION DISCHARGE NOTE   Discharge instructions by: [name] PA  Verbalized understanding: pt verbalized understanding, and pts sister verbalized understanding  Pain: 0/10  IV's: removed right AC  Tubes/Drains: tracheostomy, and peg tube clean dry intact upon discharge. Home O2 tank given running at 3L.   Safety instructions: given by PA, and family education on 6/14  Patient belongings: patient belongings given to sister. Pt given straight kits for home use, and trach supplies upon discharge.   Discharged to: home with sister, and daughter.  Discharged via: SUV with sister  Notes: Patient A/Ox4 upon discharge, pt educated on all at home procedures, and tracheostomy instructions. Sister verbalized understanding for trach education, and she would be able to take care of it at home. No complications noted at this time.    Rayburn Ma, LPN

## 2020-10-31 NOTE — Progress Notes (Signed)
Occupational Therapy TBI Note  Patient Details  Name: Charles Weeks MRN: [id number] Date of Birth: Oct 31, 1965  Today's Date: 10/31/2020 OT Missed Time: 60 Minutes Missed Time Reason: Other (comment) (family not present and pt reuses)   Pt received EOB with back brace donned. Family not present for education. Pt reporting "not feeling like doing anything." Depsite OT educating on finishing tx as pt sister has told RN she will not be present for education and will arrive after OT session pt continues to refuse tx. Provided opportunities for EOB, OOB and out of room tx, but pt declines.   Elenore Paddy Jerie Basford 10/31/2020, 7:00 AM

## 2020-10-31 NOTE — Progress Notes (Signed)
Nutrition Follow-up  DOCUMENTATION CODES:   Not applicable  INTERVENTION: Continue Ensure Enlive po TID, each supplement provides 350 kcal and 20 grams of protein   Encourage adequate PO intake.   NUTRITION DIAGNOSIS:   Inadequate oral intake related to poor appetite as evidenced by meal completion < 25%; ongoing  GOAL:   Patient will meet greater than or equal to 90% of their needs; ongoing  MONITOR:   PO intake, Supplement acceptance, Skin, Weight trends, Labs, I & O's  REASON FOR ASSESSMENT:   Consult Enteral/tube feeding initiation and management  ASSESSMENT:   55 year old right-handed male with history of COPD hypertension, alcohol/tobacco use presented with level 1 fall after a fall down approximately 11 stairs. CT cervical spine showed mildly displaced acute fractures of the right C7 transverse process extending to involve the transverse foramen. Fractures through the neural arch of T7.  Multiple right transverse process fractures involving the T4-T8 vertebral bodies.  Multiple bilateral rib fractures at the costovertebral junction from T5-T8 on the right and T7 and T8 on the left. Body fracture non-operative. Hospital course prolonged intubation undergoing tracheostomy as well as gastrostomy tube placement 09/03/2020. Pt with patient decreased functional mobility was admitted for a comprehensive rehab program.  Janina Mayo in place. Pt is currently on a regular diet with thin liquids. Meal completion has been 45-100%. Per MD, plans to keep PEG in place given potential tracheal/pulmonary interventions after discharge. Pt currently has Ensure ordered to aid in caloric and protein needs. Pt encouraged to eat his food at meals and to drink his supplements.   Labs and medications reviewed.  Diet Order:   Diet Order             Diet regular Room service appropriate? Yes; Fluid consistency: Thin  Diet effective now                   EDUCATION NEEDS:   Not appropriate for  education at this time  Skin:  Skin Assessment: Skin Integrity Issues: Skin Integrity Issues:: Incisions Incisions: neck  Last BM:  6/15  Height:   Ht Readings from Last 1 Encounters:  10/09/20 5\' 8"  (1.727 m)    Weight:   Wt Readings from Last 1 Encounters:  10/14/20 81.9 kg    BMI:  Body mass index is 27.45 kg/m.  Estimated Nutritional Needs:   Kcal:  2000-2200  Protein:  110-120 grams  Fluid:  >/= 2 L/day  10/16/20, MS, RD, LDN RD pager number/after hours weekend pager number on [employer].

## 2020-11-01 ENCOUNTER — Telehealth: Payer: Self-pay

## 2020-11-01 ENCOUNTER — Encounter: Payer: Self-pay | Admitting: Registered Nurse

## 2020-11-01 DIAGNOSIS — Z930 Tracheostomy status: Secondary | ICD-10-CM

## 2020-11-01 NOTE — Telephone Encounter (Signed)
Order has been placed.

## 2020-11-01 NOTE — Progress Notes (Addendum)
Physical Therapy Discharge Summary  Patient Details  Name: Daivd Fredericksen MRN: [id number] Date of Birth: 1966-03-08  Patient has met 8 of 8 long term goals due to improved activity tolerance, improved balance, increased strength, improved attention, and improved awareness.  Patient to discharge at an ambulatory level Supervision.   Patient's sister refused to attend family education, but reportedly said she is able to provide the necessary assistance to ensure pt safety following discharge.  Reasons goals not met: NA  Recommendation:  Patient will benefit from ongoing skilled PT services in home health setting to continue to advance safe functional mobility, address ongoing impairments in strength, balance, ambulation, endurance, and minimize fall risk.  Equipment: Recommended RW  Reasons for discharge: discharge from hospital  Patient/family agrees with progress made and goals achieved: Yes  PT Discharge Precautions/Restrictions Precautions Precautions: Back Precaution Comments: HOB < 30 degrees, TLSO donned if HOB elevated Required Braces or Orthoses: Spinal Brace Spinal Brace: Thoracolumbosacral orthotic;Applied in supine position Restrictions Weight Bearing Restrictions: No Vision/Perception  Perception Perception: Within Functional Limits Praxis Praxis: Intact  Cognition Overall Cognitive Status: Impaired/Different from baseline Arousal/Alertness: Awake/alert Orientation Level: Oriented X4 Attention: Sustained Sustained Attention: Impaired Safety/Judgment: Impaired Sensation Sensation Light Touch: Appears Intact Coordination Gross Motor Movements are Fluid and Coordinated: No Fine Motor Movements are Fluid and Coordinated: Yes Coordination and Movement Description: generalized weakness Motor  Motor Motor: Within Functional Limits Motor - Discharge Observations: Generalized weakness and deconditioning, improved since eval  Mobility Bed Mobility Bed Mobility:  Supine to Sit;Sit to Supine Supine to Sit: Supervision/Verbal cueing Sit to Supine: Supervision/Verbal cueing Transfers Transfers: Sit to Stand;Stand Pivot Transfers;Stand to Sit Sit to Stand: Supervision/Verbal cueing Stand to Sit: Supervision/Verbal cueing Stand Pivot Transfers: Supervision/Verbal cueing Stand Pivot Transfer Details: Verbal cues for precautions/safety;Verbal cues for sequencing Locomotion  Gait Ambulation: Yes Gait Assistance: Supervision/Verbal cueing Gait Distance (Feet): 300 Feet Assistive device: None Gait Assistance Details: Verbal cues for sequencing;Verbal cues for gait pattern Gait Gait: Yes Gait Pattern: Impaired Gait Pattern: Decreased stride length Gait velocity: decreased Stairs / Additional Locomotion Stairs: Yes Stairs Assistance: MinA Stair Management Technique: none Number of Stairs: 1 Height of Stairs: 4 Ramp: Contact Guard/touching assist Curb: Geophysical data processor Mobility: No  Trunk/Postural Assessment  Cervical Assessment Cervical Assessment:  (forward head) Thoracic Assessment Thoracic Assessment:  (thoracic fracture with TLSO) Lumbar Assessment Lumbar Assessment:  (posterior pelvic tilt) Postural Control Postural Control: Within Functional Limits  Balance Balance Balance Assessed: Yes Static Sitting Balance Static Sitting - Balance Support: No upper extremity supported Static Sitting - Level of Assistance: 6: Modified independent (Device/Increase time) Dynamic Sitting Balance Dynamic Sitting - Balance Support: During functional activity Dynamic Sitting - Level of Assistance: 6: Modified independent (Device/Increase time) Static Standing Balance Static Standing - Level of Assistance: 5: Stand by assistance Dynamic Standing Balance Dynamic Standing - Balance Support: During functional activity Dynamic Standing - Level of Assistance: 5: Stand by assistance Extremity Assessment  RLE Assessment General  Strength Comments: Enurance impaired LLE Assessment General Strength Comments: Endurance impaired    Breck Coons, PT, DPT 11/01/2020, 4:49 PM

## 2020-11-01 NOTE — Progress Notes (Signed)
Patient ID: Charles Weeks, male   DOB: 1966-03-16, 55 y.o.   MRN: [id number]  Sw received call from patient sister [name]),  Sister reports losing d/c instructions and will like to have a copy scanned to her emailed at: [email or web address]  Lavera Guise, [place] [phone]

## 2020-11-01 NOTE — Telephone Encounter (Signed)
-----   Message from Simonne Martinet, NP sent at 11/01/2020  3:23 PM EDT ----- Regarding: dme needs asap Need trach supplies for this man ASAP His DME is advanced  Needs the following: Replacement size 6 cuffless trach (shiley) and size 4 cuffless shiley  Also need: trach care kits #30 Trach sponges #30  12 french trach suction catheters #30 Suction machine   Thanks! Cindee Lame

## 2020-11-02 ENCOUNTER — Telehealth: Payer: Self-pay

## 2020-11-02 NOTE — Telephone Encounter (Signed)
Transitional Care call--sister [name]    Are you/is patient experiencing any problems since coming home? No Are there any questions regarding any aspect of care? Bandage not over trach at discharge, supplies not sent home with patient, discharge education not completely Are there any questions regarding medications administration/dosing? No Are meds being taken as prescribed? Yes Patient should review meds with caller to confirm Have there been any falls? Yes one going up the steps Has Home Health been to the house and/or have they contacted you? Outpatient therapy, appointment made If not, have you tried to contact them? Can we help you contact them? Are bowels and bladder emptying properly? Yes Are there any unexpected incontinence issues? No If applicable, is patient following bowel/bladder programs? Any fevers, problems with breathing, unexpected pain? No Are there any skin problems or new areas of breakdown? No Has the patient/family member arranged specialty MD follow up (ie cardiology/neurology/renal/surgical/etc)?  Yes Can we help arrange? Does the patient need any other services or support that we can help arrange? No Are caregivers following through as expected in assisting the patient? Yes Has the patient quit smoking, drinking alcohol, or using drugs as recommended? Yes  Appointment time 2:40 pm, arrive time 2:20 pm with Riley Lam on 11/15/20 [address]

## 2020-11-05 ENCOUNTER — Other Ambulatory Visit: Payer: Self-pay

## 2020-11-05 ENCOUNTER — Encounter: Payer: Self-pay | Admitting: Internal Medicine

## 2020-11-05 ENCOUNTER — Ambulatory Visit (INDEPENDENT_AMBULATORY_CARE_PROVIDER_SITE_OTHER): Payer: Medicaid Other | Admitting: Internal Medicine

## 2020-11-05 VITALS — BP 137/85 | HR 98 | Temp 98.3°F | Resp 22 | Ht 68.0 in | Wt 176.4 lb

## 2020-11-05 DIAGNOSIS — Z09 Encounter for follow-up examination after completed treatment for conditions other than malignant neoplasm: Secondary | ICD-10-CM

## 2020-11-05 DIAGNOSIS — Z930 Tracheostomy status: Secondary | ICD-10-CM

## 2020-11-05 DIAGNOSIS — N401 Enlarged prostate with lower urinary tract symptoms: Secondary | ICD-10-CM

## 2020-11-05 DIAGNOSIS — F418 Other specified anxiety disorders: Secondary | ICD-10-CM

## 2020-11-05 DIAGNOSIS — R338 Other retention of urine: Secondary | ICD-10-CM

## 2020-11-05 DIAGNOSIS — S22000G Wedge compression fracture of unspecified thoracic vertebra, subsequent encounter for fracture with delayed healing: Secondary | ICD-10-CM

## 2020-11-05 NOTE — Progress Notes (Signed)
Established Patient Office Visit  Subjective:  Patient ID: Charles Weeks, male    DOB: Jul 17, 1965  Age: 55 y.o. MRN: [id number]  CC:  Chief Complaint  Patient presents with   Fall    Pt had a fall fell on his head from second story broke head neck back and ribs punctured his lung discharged 10-31-20 also needs a referral to urologist as he is having to self cath     HPI Charles Weeks is a 55 year old male with PMH of depression with anxiety who presents for hospital discharge follow up. His sister is present during the visit, who has helped with the history in addition to chart review.  Independent prior to admission.  Two-level home bedroom downstairs.  Presented 08/17/2020 after a fall down from second floor at home through a half-wall and was found by sister.  Denied loss of consciousness.  Noted to be hypoxic in the 70s placed on nonrebreather mask.  Cranial CT scan negative for acute changes.  CT cervical spine showed mildly displaced acute fractures of the right C7 transverse process extending to involve the transverse foramen.  CT of the chest abdomen pelvis showed a large right pneumothorax with small amount of hemorrhage within the pleural space.  3 column fracture of the T8 vertebral body.  No retropulsion.  Fractures through the neural arch of T7.  Multiple right transverse process fractures involving T4-T8 vertebral bodies.  Multiple bilateral rib fractures at the costovertebral junction from T5-T8 on the right and T7 and T8 on the left.  No evidence of solid organ injury.  CT angiogram of the neck showed no acute vascular injury to the neck.  Patient did sustain complex laceration of the scalp with repair.  Follow-up neurosurgery Dr. Marikay Alar in regards to 3 column T8 vertebral body fracture nonoperative placed in a brace.  Right C7 transverse process fracture through foramen initially with cervical collar that was later discontinued.  Hospital course prolonged intubation undergoing  tracheostomy as well as gastrostomy tube placement 09/03/2020 per Dr. Janee Morn.  Trach changed to #6 cuffless 10/05/2020 due to secretions.  He was cleared to begin Lovenox for DVT prophylaxis as well as resume home aspirin.   He has had PT in the hospital and is set for outpatient PT. He is to f/u with PMNR, Neurosurgeon and Development worker, international aid. He has trach and PEG in place. He has been tolerating diet and has BM regularly. He self catheterizes and needs to see Urologist. Denies any dysuria or hematuria.  Past Medical History:  Diagnosis Date   Anxiety    Anxiety 01/19/2020   Arthritis    Benign prostatic hyperplasia with weak urinary stream 12/08/2019   BPH (benign prostatic hyperplasia) 08/17/2020   CAD (coronary artery disease) 08/17/2020   Chronic respiratory failure with hypoxia (HCC) 01/24/2020   Has home 02 but not using as of 01/24/2020  -  01/24/2020   Walked RA  approx   200 ft  @ slow pace  stopped due to  Dizzy with sats still 95%      Cigarette smoker 01/24/2020   Stopped regular smoking 10/2019 but still "now and then" as of 01/24/2020    COPD (chronic obstructive pulmonary disease) (HCC)    COPD (chronic obstructive pulmonary disease) (HCC) 08/17/2020   gold   COPD GOLD ?     Quit smoking 10/2019  - Labs ordered 01/24/2020  :  allergy profile   alpha one AT phenotype   - 01/24/2020  After extensive  coaching inhaler device,  effectiveness =    75% (short Ti) > change symb 80 to 160 2bid      Coronary artery disease 06/08/2018   DES to RCA, [facility] [place])   Depression    Depression, major, single episode, moderate (HCC) 01/19/2020   Encounter for screening for malignant neoplasm of colon 12/08/2019   Essential hypertension    Essential hypertension 12/08/2019   History of MI (myocardial infarction) 01/19/2020   HTN (hypertension) 08/17/2020   Myocardial infarct [facility])    Myocardial infarct (HCC)    Respiratory failure with hypoxia [facility])     Past Surgical History:  Procedure  Laterality Date   BOWEL RESECTION  2006   PEG PLACEMENT N/A 09/03/2020   Procedure: PERCUTANEOUS ENDOSCOPIC GASTROSTOMY (PEG) PLACEMENT;  Surgeon: Violeta Gelinas, MD;  Location: [facility] OR;  Service: General;  Laterality: N/A;   PERCUTANEOUS TRACHEOSTOMY N/A 09/03/2020   Procedure: PERCUTANEOUS TRACHEOSTOMY USING 6 SHILEY;  Surgeon: Violeta Gelinas, MD;  Location: [facility] OR;  Service: General;  Laterality: N/A;   SKIN GRAFT     TONSILLECTOMY AND ADENOIDECTOMY      Family History  Problem Relation Age of Onset   Heart disease Mother    Heart disease Father    Diabetes Sister     Social History   Socioeconomic History   Marital status: Single    Spouse name: Not on file   Number of children: 2   Years of education: Not on file   Highest education level: 7th grade  Occupational History   Not on file  Tobacco Use   Smoking status: Former    Pack years: 0.00    Types: Cigarettes   Smokeless tobacco: Never  Vaping Use   Vaping Use: Never used  Substance and Sexual Activity   Alcohol use: Yes   Drug use: Not Currently   Sexual activity: Not on file  Other Topics Concern   Not on file  Social History Narrative   ** Merged History Encounter **       Lives with sister  Both daughters live close by   Sister has dog   Enjoys: shooting pool, fishing, Therapist, nutritional  Diet: eats all food groups Caffeine: pop-dr pepper 2 liter  Water: 4-6 cups   Wears seat belt  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 No weapons        Social Determinants of Corporate investment banker Strain: Low Risk    Difficulty of Paying Living Expenses: Not hard at all  Food Insecurity: No Food Insecurity   Worried About Programme researcher, broadcasting/film/video in the Last Year: Never true   Barista in the Last Year: Never true  Transportation Needs: No Transportation Needs   Lack of Transportation (Medical): No   Lack of Transportation (Non-Medical): No  Physical Activity: Inactive   Days of  Exercise per Week: 0 days   Minutes of Exercise per Session: 0 min  Stress: No Stress Concern Present   Feeling of Stress : Not at all  Social Connections: Socially Isolated   Frequency of Communication with Friends and Family: More than three times a week   Frequency of Social Gatherings with Friends and Family: More than three times a week   Attends Religious Services: Never   Database administrator or Organizations: No   Attends Banker Meetings: Never   Marital Status: Divorced  Catering manager Violence: Not At  Risk   Fear of Current or Ex-Partner: No   Emotionally Abused: No   Physically Abused: No   Sexually Abused: No    Outpatient Medications Prior to Visit  Medication Sig Dispense Refill   acetaminophen (TYLENOL) 325 MG tablet Take 2 tablets (650 mg total) by mouth every 6 (six) hours as needed for mild pain or moderate pain.     albuterol (PROAIR HFA) 108 (90 Base) MCG/ACT inhaler Inhale 1-2 puffs into the lungs every 4 (four) hours as needed for wheezing or shortness of breath. 8.5 g 11   albuterol (PROAIR HFA) 108 (90 Base) MCG/ACT inhaler Inhale 1-2 puffs into the lungs every 4 (four) hours as needed for wheezing or shortness of breath. 6.7 g 0   albuterol (PROVENTIL) (2.5 MG/3ML) 0.083% nebulizer solution Take 3 mLs (2.5 mg total) by nebulization every 6 (six) hours as needed for wheezing or shortness of breath. 75 mL 12   aspirin 81 MG chewable tablet Chew 1 tablet (81 mg total) by mouth daily.     aspirin EC 81 MG tablet Take 1 tablet (81 mg total) by mouth daily. Swallow whole. 90 tablet 3   bethanechol (URECHOLINE) 25 MG tablet Take 2 tablets (50 mg total) by mouth 4 (four) times daily. 180 tablet 0   buPROPion (WELLBUTRIN XL) 300 MG 24 hr tablet Take 1 tablet (300 mg total) by mouth daily. 30 tablet 0   busPIRone (BUSPAR) 10 MG tablet Take 1 tablet (10 mg total) by mouth 3 (three) times daily. 90 tablet 0   clonazePAM (KLONOPIN) 0.5 MG tablet Take 1  tablet (0.5 mg total) by mouth at bedtime. 30 tablet 0   docusate sodium (COLACE) 100 MG capsule Take 1 capsule (100 mg total) by mouth daily. 10 capsule 0   folic acid (FOLVITE) 1 MG tablet Take 1 tablet (1 mg total) by mouth daily. 30 tablet 0   lidocaine (LIDODERM) 5 % Place 1 patch onto the skin daily. Remove & Discard patch within 12 hours or as directed by MD 30 patch 0   mometasone-formoterol (DULERA) 200-5 MCG/ACT AERO Inhale 2 puffs into the lungs 2 (two) times daily. 13 g 1   montelukast (SINGULAIR) 10 MG tablet Take 1 tablet (10 mg total) by mouth at bedtime. 30 tablet 0   Multiple Vitamin (MULTIVITAMIN WITH MINERALS) TABS tablet Place 1 tablet into feeding tube daily.     Multiple Vitamin (MULTIVITAMIN WITH MINERALS) TABS tablet Take 1 tablet by mouth daily.     nitroGLYCERIN (NITROSTAT) 0.4 MG SL tablet Place 1 tablet (0.4 mg total) under the tongue every 5 (five) minutes as needed for chest pain. 25 tablet 3   oxyCODONE (OXY IR/ROXICODONE) 5 MG immediate release tablet Take 1 tablet (5 mg total) by mouth every 4 (four) hours as needed for moderate pain. 30 tablet 0   OXYGEN Inhale 2 L into the lungs as needed (shortness of breath).     pantoprazole (PROTONIX) 40 MG tablet Take 1 tablet (40 mg total) by mouth daily. 30 tablet 1   polyethylene glycol (MIRALAX / GLYCOLAX) 17 g packet Take 17 g by mouth daily. 14 each 0   predniSONE (DELTASONE) 5 MG tablet Take 1 tablet (5 mg total) by mouth daily with breakfast. 30 tablet 0   QUEtiapine (SEROQUEL) 50 MG tablet Take 3 tablets (150mg  total) TWO times daily AND 6 tablets (300 mg total) at bedtime 180 tablet 0   scopolamine (TRANSDERM-SCOP) 1 MG/3DAYS Place 1 patch (1.5 mg  total) onto the skin every 3 (three) days. 10 patch 12   tamsulosin (FLOMAX) 0.4 MG CAPS capsule Take 1 capsule (0.4 mg total) by mouth 2 (two) times daily. 60 capsule 0   terazosin (HYTRIN) 2 MG capsule Take 1 capsule (2 mg total) by mouth daily. 30 capsule 0   No  facility-administered medications prior to visit.    No Known Allergies  ROS Review of Systems  Constitutional:  Negative for chills and fever.  HENT:  Negative for congestion and sore throat.   Eyes:  Negative for pain and discharge.  Respiratory:  Negative for cough and shortness of breath.   Cardiovascular:  Negative for chest pain and palpitations.  Gastrointestinal:  Positive for constipation. Negative for diarrhea, nausea and vomiting.  Endocrine: Negative for polydipsia and polyuria.  Genitourinary:  Positive for difficulty urinating. Negative for dysuria and hematuria.  Musculoskeletal:  Positive for arthralgias, back pain, gait problem, myalgias and neck pain.  Skin:  Negative for rash.  Neurological:  Negative for dizziness, weakness, numbness and headaches.  Psychiatric/Behavioral:  Positive for decreased concentration, dysphoric mood and sleep disturbance. The patient is nervous/anxious.      Objective:    Physical Exam Vitals reviewed.  Constitutional:      General: He is not in acute distress.    Appearance: He is not diaphoretic.  HENT:     Head: Normocephalic and atraumatic.     Nose: Nose normal.     Mouth/Throat:     Mouth: Mucous membranes are moist.  Eyes:     General: No scleral icterus.    Extraocular Movements: Extraocular movements intact.  Cardiovascular:     Rate and Rhythm: Normal rate and regular rhythm.     Pulses: Normal pulses.     Heart sounds: Normal heart sounds. No murmur heard. Pulmonary:     Breath sounds: Normal breath sounds. No wheezing or rales.     Comments: Tracheostomy in place Abdominal:     Palpations: Abdomen is soft.     Tenderness: There is no abdominal tenderness.  Musculoskeletal:     Cervical back: Neck supple. No tenderness.     Right lower leg: No edema.     Left lower leg: No edema.     Comments: Has thoracic brace in place  Skin:    General: Skin is warm.     Findings: No rash.  Neurological:     General:  No focal deficit present.     Mental Status: He is alert and oriented to person, place, and time.     Sensory: No sensory deficit.     Motor: No weakness.  Psychiatric:        Mood and Affect: Mood normal.        Behavior: Behavior normal.    BP 137/85 (BP Location: Left Arm, Patient Position: Sitting, Cuff Size: Normal)   Pulse 98   Temp 98.3 F (36.8 C) (Oral)   Resp (!) 22   Ht 5\' 8"  (1.727 m)   Wt 176 lb 6.4 oz (80 kg)   SpO2 (!) 89%   BMI 26.82 kg/m  Wt Readings from Last 3 Encounters:  11/05/20 176 lb 6.4 oz (80 kg)  10/14/20 180 lb 8.9 oz (81.9 kg)  10/07/20 177 lb 7.5 oz (80.5 kg)     Health Maintenance Due  Topic Date Due   Pneumococcal Vaccine 300-56 Years old (1 - PCV) Never done   Hepatitis C Screening  Never done   COLONOSCOPY (  Pts 45-54yrs Insurance coverage will need to be confirmed)  Never done   Zoster Vaccines- Shingrix (1 of 2) Never done   COVID-19 Vaccine (3 - Booster for Moderna series) 06/18/2020    There are no preventive care reminders to display for this patient.  No results found for: TSH Lab Results  Component Value Date   WBC 9.7 10/19/2020   HGB 12.0 (L) 10/19/2020   HCT 39.2 10/19/2020   MCV 91.4 10/19/2020   PLT 345 10/19/2020   Lab Results  Component Value Date   NA 139 10/10/2020   K 3.9 10/10/2020   CO2 30 10/10/2020   GLUCOSE 119 (H) 10/10/2020   BUN 17 10/10/2020   CREATININE 0.85 10/10/2020   BILITOT 0.7 10/10/2020   ALKPHOS 105 10/10/2020   AST 20 10/10/2020   ALT 27 10/10/2020   PROT 6.9 10/10/2020   ALBUMIN 3.4 (L) 10/10/2020   CALCIUM 9.4 10/10/2020   ANIONGAP 9 10/10/2020   Lab Results  Component Value Date   CHOL 158 11/07/2019   Lab Results  Component Value Date   HDL 45 11/07/2019   Lab Results  Component Value Date   LDLCALC 81 11/07/2019   Lab Results  Component Value Date   TRIG 140 09/05/2020   Lab Results  Component Value Date   CHOLHDL 3.5 11/07/2019   Lab Results  Component Value  Date   HGBA1C 6.2 (H) 08/19/2020      Assessment & Plan:   Problem List Items Addressed This Visit       Hospital discharge follow-up    -  Primary Hospital chart reviewed including discharge summary Traumatic injuries of ribs, cervical and thoracic spine, penumothorax and scalp injury Fall prevention - emphasized to use walking support Continue PT and f/u with PMNR  Musculoskeletal and Integument   Thoracic spine fracture (HCC) Has thoracic brace in place F/u with Neurosurgeon     Genitourinary   Urinary retention due to benign prostatic hyperplasia On Hydrin Self catheterizes currently Referred to Urology for catheter care guidance   Relevant Orders   Ambulatory referral to Urology     Other   Tracheostomy in place [facility])   Tolerating well, denies dyspnea, dysphagia or odynophagia    Depression with anxiety On Seroquel and Clonazepam Advised for Psychology referral, patient denies.       No orders of the defined types were placed in this encounter.   Follow-up: Return in about 3 months (around 02/05/2021) for Trauma, depression with anxiety.    Anabel Halon, MD

## 2020-11-05 NOTE — Patient Instructions (Signed)
Please continue to take medications as prescribed.  Please follow up with specialists as scheduled.  You are being referred to Urologist for catheter care.  Please use walking support - walker/cane all the times. Continue to participate in physical therapy.

## 2020-11-06 ENCOUNTER — Telehealth: Payer: Self-pay | Admitting: *Deleted

## 2020-11-06 MED ORDER — QUETIAPINE FUMARATE 50 MG PO TABS: ORAL_TABLET | ORAL | 0 refills | Status: DC

## 2020-11-06 NOTE — Telephone Encounter (Signed)
Mr Hoagland's sister called and he is going to be out of his seroquel written at discharge.  He was only dispensed #100 from the #180 total from the [facility].  At  her request I have sent the Rx to [facility] in [place]. He has seen his PCP but they were told rehab physicians would be filling medications.  This will need to be discussed at appt next week with Jacalyn Lefevre NP

## 2020-11-12 ENCOUNTER — Telehealth: Payer: Self-pay | Admitting: *Deleted

## 2020-11-12 NOTE — Telephone Encounter (Signed)
Mrs [name] (sister) called and said that Charles Weeks is out of his pain medication and he needs a refill. He has an appt scheduled for 11/15/20 with Riley Lam, but she says he is out and needs it.  Please advise,

## 2020-11-13 ENCOUNTER — Encounter: Payer: Medicaid Other | Admitting: Registered Nurse

## 2020-11-13 MED ORDER — OXYCODONE HCL 5 MG PO TABS: 5.0000 mg | ORAL_TABLET | ORAL | 0 refills | Status: DC | PRN

## 2020-11-13 NOTE — Telephone Encounter (Signed)
Oxycodone refilled.

## 2020-11-13 NOTE — Telephone Encounter (Signed)
Sister notified by April Marshall.

## 2020-11-15 ENCOUNTER — Encounter: Payer: Medicaid Other | Attending: Registered Nurse | Admitting: Registered Nurse

## 2020-11-15 ENCOUNTER — Encounter: Payer: Self-pay | Admitting: Registered Nurse

## 2020-11-15 ENCOUNTER — Other Ambulatory Visit: Payer: Self-pay

## 2020-11-15 VITALS — BP 133/89 | HR 92 | Temp 98.3°F | Ht 68.0 in | Wt 180.6 lb

## 2020-11-15 DIAGNOSIS — G8918 Other acute postprocedural pain: Secondary | ICD-10-CM

## 2020-11-15 DIAGNOSIS — R338 Other retention of urine: Secondary | ICD-10-CM

## 2020-11-15 DIAGNOSIS — Z930 Tracheostomy status: Secondary | ICD-10-CM | POA: Diagnosis present

## 2020-11-15 DIAGNOSIS — J386 Stenosis of larynx: Secondary | ICD-10-CM | POA: Diagnosis not present

## 2020-11-15 DIAGNOSIS — S22009A Unspecified fracture of unspecified thoracic vertebra, initial encounter for closed fracture: Secondary | ICD-10-CM

## 2020-11-15 DIAGNOSIS — N401 Enlarged prostate with lower urinary tract symptoms: Secondary | ICD-10-CM

## 2020-11-15 MED ORDER — BETHANECHOL CHLORIDE 25 MG PO TABS: 50.0000 mg | ORAL_TABLET | Freq: Four times a day (QID) | ORAL | 0 refills | Status: DC

## 2020-11-15 MED ORDER — OXYCODONE HCL 5 MG PO TABS: 5.0000 mg | ORAL_TABLET | Freq: Four times a day (QID) | ORAL | 0 refills | Status: DC | PRN

## 2020-11-15 NOTE — Progress Notes (Signed)
Subjective:    Patient ID: Charles Weeks, male    DOB: 1966/05/06, 55 y.o.   MRN: [id number]  HPI: Charles Weeks is a 55 y.o. male who is here for Transitional Care Visit for follow up of his Thoracic Spine Fracture, Subglottic Stenosis, Tracheostomy in Place and Urinary Retention due to BPH. He presented to [facility] on 08/17/2020,  per Barnetta Chapel Pa-C:  HPI:  This is a 55 yo white male with a history of COPD GOLD, CAD, s/p MI with DES placement in RCA in Jan 2020 just on ASA, HTN, Depression/Anxiety, BPH who is followed by Dr. Sherene Sires for pulm and Dr. Diona Browner with cards who fell down about 11 stairs.  He states he drank 4-5 beers today but his ETOH is only 19 on arrival and he does not drink routinely.  He did not have syncope, but he really doesn't know what he fell.  He was found by his sister and EMS was called.  He was brought in as a level II and ultimately upgraded to a level I secondary to hypoxia in the 70s.  He responded to a non-rebreather.  Upon our arrival, he was noted to be normotensive with a large scalp laceration and complaining of horrible mid back pain.  Marland Kitchen  He was completely neuro intact moving all 4 with normal sensation and a GCS of 15.    DG: Chest:  IMPRESSION: Small right pneumothorax.  Attention on forthcoming chest CT.  DG: Pelvis:  FINDINGS: The image is under exposed. There is no evidence of pelvic fracture or diastasis. No pelvic bone lesions are seen.   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 No definite acute fracture or malalignment. Attention on forthcoming CT abdomen pelvis.  CT Head WO Contrast:  IMPRESSION: No evidence of acute intracranial injury.   CT Cervical Spine:  IMPRESSION: Mildly displaced acute fracture through the right C7 transverse process extending to involve the transverse foramen.   Otherwise, no acute cervical spine fracture identified, noting motion artifact limits evaluation for subtle abnormality.   CT: Chest:  IMPRESSION: Chest Impression:   1. Large RIGHT pneumothorax with small amount hemorrhage within the pleural space. 2. No evidence of aortic injury. 3. Three column fracture of the T8 vertebral body.  No retropulsion. 4. Fracture through the neural arch of T7. 5. Multiple RIGHT transverse process fractures involving the T4 through T8 vertebral bodies. 6. Multiple bilateral rib fractures at the costovertebral junction from T5 through T8 on the RIGHT and T7 and T8 on the LEFT.   Abdomen / Pelvis Impression:   1. No evidence of solid organ injury in the abdomen pelvis. 2. No evidence of lumbar spine fracture or pelvic fracture. 3. No evidence of abdominal aortic injury.   Neurosurgery and ENT Following.  Charles Weeks had a long complicated hospital course see Mariam Dollar discharge note for more details. He  was admitted to inpatient rehabilitation on 10/09/2020 and discharged home on 10/31/2020. He was scheduled to have outpatient therapy at [facility], his sister [name] states he doesn't have a wat to get to [employer]. This provider sent an e-mail to [facility], regarding his lack of transportation.  Charles Weeks reports generalized pain and his sister [name] reports he has a good appetite. He rated his pain 7 on his Health and History.   His Peg-tube was removed two days ago and his Janina Mayo is capped.   Ms. [name] ( sister) had many complaints regarding Charles Weeks stay  at the hospital, Misty Stanley Conservator, museum/gallery) will give her a call to address her complaints, she verbalizes understanding.    Pain Inventory Average Pain 7 Pain Right Now 7 My pain is sharp and aching  In the last 24 hours, has pain interfered with the following? General activity 10 Relation with others 10 Enjoyment of life 10 What TIME of day is your pain at its worst? daytime Sleep (in general) Fair  Pain is worse with: walking, bending, sitting, standing, and some activites Pain improves  with: rest and medication Relief from Meds: 4  walk without assistance how many minutes can you walk? 3-4 ability to climb steps?  no do you drive?  no  disabled: date disabled n/a I need assistance with the following:  dressing, bathing, meal prep, household duties, and shopping  bladder control problems bowel control problems weakness numbness tremor trouble walking confusion depression anxiety suicidal thoughts  N/a  N/a    Family History  Problem Relation Age of Onset   Heart disease Mother    Heart disease Father    Diabetes Sister    Social History   Socioeconomic History   Marital status: Single    Spouse name: Not on file   Number of children: 2   Years of education: Not on file   Highest education level: 7th grade  Occupational History   Not on file  Tobacco Use   Smoking status: Former    Pack years: 0.00    Types: Cigarettes   Smokeless tobacco: Never  Vaping Use   Vaping Use: Never used  Substance and Sexual Activity   Alcohol use: Yes   Drug use: Not Currently   Sexual activity: Not on file  Other Topics Concern   Not on file  Social History Narrative   ** Merged History Encounter **       Lives with sister  Both daughters live close by   Sister has dog   Enjoys: shooting pool, fishing, Therapist, nutritional  Diet: eats all food groups Caffeine: pop-dr pepper 2 liter  Water: 4-6 cups   Wears seat belt  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 No weapons        Social Determinants of Corporate investment banker Strain: Low Risk    Difficulty of Paying Living Expenses: Not hard at all  Food Insecurity: No Food Insecurity   Worried About Programme researcher, broadcasting/film/video in the Last Year: Never true   Barista in the Last Year: Never true  Transportation Needs: No Transportation Needs   Lack of Transportation (Medical): No   Lack of Transportation (Non-Medical): No  Physical Activity: Inactive   Days of Exercise per Week:  0 days   Minutes of Exercise per Session: 0 min  Stress: No Stress Concern Present   Feeling of Stress : Not at all  Social Connections: Socially Isolated   Frequency of Communication with Friends and Family: More than three times a week   Frequency of Social Gatherings with Friends and Family: More than three times a week   Attends Religious Services: Never   Database administrator or Organizations: No   Attends Banker Meetings: Never   Marital Status: Divorced   Past Surgical History:  Procedure Laterality Date   BOWEL RESECTION  2006   PEG PLACEMENT N/A 09/03/2020   Procedure: PERCUTANEOUS ENDOSCOPIC GASTROSTOMY (PEG) PLACEMENT;  Surgeon: Violeta Gelinas, MD;  Location: [facility] OR;  Service:  General;  Laterality: N/A;   PERCUTANEOUS TRACHEOSTOMY N/A 09/03/2020   Procedure: PERCUTANEOUS TRACHEOSTOMY USING 6 SHILEY;  Surgeon: Violeta Gelinas, MD;  Location: [facility] OR;  Service: General;  Laterality: N/A;   SKIN GRAFT     TONSILLECTOMY AND ADENOIDECTOMY     Past Medical History:  Diagnosis Date   Anxiety    Anxiety 01/19/2020   Arthritis    Benign prostatic hyperplasia with weak urinary stream 12/08/2019   BPH (benign prostatic hyperplasia) 08/17/2020   CAD (coronary artery disease) 08/17/2020   Chronic respiratory failure with hypoxia (HCC) 01/24/2020   Has home 02 but not using as of 01/24/2020  -  01/24/2020   Walked RA  approx   200 ft  @ slow pace  stopped due to  Dizzy with sats still 95%      Cigarette smoker 01/24/2020   Stopped regular smoking 10/2019 but still "now and then" as of 01/24/2020    COPD (chronic obstructive pulmonary disease) (HCC)    COPD (chronic obstructive pulmonary disease) (HCC) 08/17/2020   gold   COPD GOLD ?     Quit smoking 10/2019  - Labs ordered 01/24/2020  :  allergy profile   alpha one AT phenotype   - 01/24/2020  After extensive coaching inhaler device,  effectiveness =    75% (short Ti) > change symb 80 to 160 2bid      Coronary artery disease 06/08/2018    DES to RCA, [facility] [place] [place])   Depression    Depression, major, single episode, moderate (HCC) 01/19/2020   Encounter for screening for malignant neoplasm of colon 12/08/2019   Essential hypertension    Essential hypertension 12/08/2019   History of MI (myocardial infarction) 01/19/2020   HTN (hypertension) 08/17/2020   Myocardial infarct (HCC)    Myocardial infarct (HCC)    Respiratory failure with hypoxia (HCC)    BP 133/89 (BP Location: Right Arm, Patient Position: Sitting, Cuff Size: Large)   Pulse 92   Temp 98.3 F (36.8 C) (Oral)   Ht 5\' 8"  (1.727 m)   Wt 180 lb 9.6 oz (81.9 kg)   SpO2 93%   BMI 27.46 kg/m   Opioid Risk Score:   Fall Risk Score:  `1  Depression screen PHQ 2/9  Depression screen [facility] 2/9 11/05/2020 04/16/2020 03/06/2020 01/19/2020 12/08/2019  Decreased Interest 0 2 1 2  0  Down, Depressed, Hopeless 3 2 1 3 3   PHQ - 2 Score 3 4 2 5 3   Altered sleeping 0 2 0 2 0  Tired, decreased energy 3 2 3 2 3   Change in appetite 0 1 0 1 1  Feeling bad or failure about yourself  0 0 1 1 1   Trouble concentrating 0 0 1 2 1   Moving slowly or fidgety/restless 0 0 1 2 1   Suicidal thoughts 3 0 0 0 0  PHQ-9 Score 9 9 8 15 10   Difficult doing work/chores Very difficult - Not difficult at all Very difficult Somewhat difficult      Review of Systems  Constitutional: Negative.   HENT: Negative.    Eyes: Negative.   Respiratory:  Positive for cough and shortness of breath.   Cardiovascular: Negative.   Gastrointestinal: Negative.   Musculoskeletal:  Positive for back pain, gait problem and neck pain.  Skin: Negative.   Neurological:  Positive for weakness and numbness.  Hematological: Negative.   Psychiatric/Behavioral:  Positive for dysphoric mood and suicidal ideas. The patient is nervous/anxious.  Objective:   Physical Exam Vitals and nursing note reviewed.  Constitutional:      Appearance: Normal appearance.  Cardiovascular:     Rate and  Rhythm: Normal rate and regular rhythm.     Pulses: Normal pulses.     Heart sounds: Normal heart sounds.  Pulmonary:     Breath sounds: Rhonchi present.  Musculoskeletal:     Cervical back: Normal range of motion and neck supple.     Comments: Normal Muscle Bulk and Muscle Testing Reveals:  Upper Extremities: Right: Decreased ROM 45 Degrees  and Muscle Strength 5/5 Left Upper Extremity: Full ROM and Muscle Strength 5/5 Thoracic and Lumbar Hypersensitivity Lower Extremities: Full ROM and Muscle Strength 5/5 Arises from Table with ease Narrow Based  Gait     Skin:    General: Skin is warm and dry.  Neurological:     Mental Status: He is alert and oriented to person, place, and time.  Psychiatric:        Mood and Affect: Mood normal.        Behavior: Behavior normal.         Assessment & Plan:  1.Thoracic Spine Fracture: He has a scheduled appointment with Dr Yetta Barre: Wearing Back Brace. Continue HEP as Tolerated. Continue to Monitor.  2.Subglottic Stenosis/Tracheostomy: Dr. Marene Lenz Following. Continue to Monitor.  3. Urinary Retention due to BPH: Continue current medication regimen. PCP following.  4. Post-Operative Pain: Refilled Oxycodone 5 mg every 6 hours #100. We will continue the opioid monitoring program, this consists of regular clinic visits, examinations, urine drug screen, pill counts as well as use of [place] Controlled Substance Reporting system. A 12 month History has been reviewed on the [place] Controlled Substance Reporting System on 11/15/2020   F/U in 4- 6 weeks with Dr Riley Kill

## 2020-11-20 ENCOUNTER — Telehealth: Payer: Self-pay

## 2020-11-20 NOTE — Telephone Encounter (Signed)
Pt needs Klonopin, Flomax, welbutrin, urecholine, floic acid , prednisone, singular, protonix, hytrin, buspar,  proair inhalor, and dulera inhaler ,please send to [facility]

## 2020-11-21 ENCOUNTER — Other Ambulatory Visit: Payer: Self-pay | Admitting: Neurological Surgery

## 2020-11-21 ENCOUNTER — Other Ambulatory Visit: Payer: Self-pay

## 2020-11-21 ENCOUNTER — Telehealth: Payer: Self-pay | Admitting: Physical Medicine & Rehabilitation

## 2020-11-21 ENCOUNTER — Other Ambulatory Visit: Payer: Self-pay | Admitting: Nurse Practitioner

## 2020-11-21 DIAGNOSIS — S22009S Unspecified fracture of unspecified thoracic vertebra, sequela: Secondary | ICD-10-CM

## 2020-11-21 DIAGNOSIS — R490 Dysphonia: Secondary | ICD-10-CM

## 2020-11-21 DIAGNOSIS — J449 Chronic obstructive pulmonary disease, unspecified: Secondary | ICD-10-CM

## 2020-11-21 DIAGNOSIS — S22068S Other fracture of T7-T8 thoracic vertebra, sequela: Secondary | ICD-10-CM

## 2020-11-21 MED ORDER — DULERA 200-5 MCG/ACT IN AERO
2.0000 | INHALATION_SPRAY | Freq: Two times a day (BID) | RESPIRATORY_TRACT | 1 refills | Status: DC
Start: 2020-11-21 — End: 2020-12-24

## 2020-11-21 MED ORDER — BUSPIRONE HCL 10 MG PO TABS: 10.0000 mg | ORAL_TABLET | Freq: Three times a day (TID) | ORAL | 0 refills | Status: DC

## 2020-11-21 MED ORDER — BETHANECHOL CHLORIDE 25 MG PO TABS: 50.0000 mg | ORAL_TABLET | Freq: Four times a day (QID) | ORAL | 0 refills | Status: DC

## 2020-11-21 MED ORDER — PREDNISONE 5 MG PO TABS: 5.0000 mg | ORAL_TABLET | Freq: Every day | ORAL | 0 refills | Status: DC

## 2020-11-21 MED ORDER — PANTOPRAZOLE SODIUM 40 MG PO TBEC: 40.0000 mg | DELAYED_RELEASE_TABLET | Freq: Every day | ORAL | 1 refills | Status: DC

## 2020-11-21 MED ORDER — TAMSULOSIN HCL 0.4 MG PO CAPS: 0.4000 mg | ORAL_CAPSULE | Freq: Two times a day (BID) | ORAL | 0 refills | Status: DC

## 2020-11-21 MED ORDER — FOLIC ACID 1 MG PO TABS: 1.0000 mg | ORAL_TABLET | Freq: Every day | ORAL | 0 refills | Status: DC

## 2020-11-21 MED ORDER — TERAZOSIN HCL 2 MG PO CAPS: 2.0000 mg | ORAL_CAPSULE | Freq: Every day | ORAL | 0 refills | Status: DC

## 2020-11-21 MED ORDER — MONTELUKAST SODIUM 10 MG PO TABS: 10.0000 mg | ORAL_TABLET | Freq: Every day | ORAL | 0 refills | Status: DC

## 2020-11-21 MED ORDER — ALBUTEROL SULFATE HFA 108 (90 BASE) MCG/ACT IN AERS: 1.0000 | INHALATION_SPRAY | RESPIRATORY_TRACT | 11 refills | Status: DC | PRN

## 2020-11-21 MED ORDER — BUPROPION HCL ER (XL) 300 MG PO TB24: 300.0000 mg | ORAL_TABLET | Freq: Every day | ORAL | 0 refills | Status: DC

## 2020-11-21 NOTE — Telephone Encounter (Signed)
I sent in prednisone, but he gets klonopin from psychiatry.

## 2020-11-21 NOTE — Telephone Encounter (Signed)
Patient spouse called needs meds called into [place].

## 2020-11-21 NOTE — Telephone Encounter (Signed)
Clarification: Does Keshun need these or is this for his spouse?

## 2020-11-21 NOTE — Telephone Encounter (Signed)
Patients sister [name] called and gave list of issues during hospitall stay.  I made copious notes and will draft and send to compliance - advised ZS  [phone] 3:00pm Misty Stanley

## 2020-11-26 ENCOUNTER — Telehealth: Payer: Self-pay

## 2020-11-26 ENCOUNTER — Other Ambulatory Visit: Payer: Self-pay | Admitting: Nurse Practitioner

## 2020-11-26 ENCOUNTER — Telehealth: Payer: Self-pay | Admitting: Registered Nurse

## 2020-11-26 MED ORDER — QUETIAPINE FUMARATE 300 MG PO TABS: ORAL_TABLET | ORAL | 1 refills | Status: DC

## 2020-11-26 NOTE — Telephone Encounter (Signed)
Seroquel refill please

## 2020-11-26 NOTE — Telephone Encounter (Signed)
Patient need med refill to [facility] asking to fill for 30 days.  Rx #: [id number]  clonazePAM (KLONOPIN) 0.5 MG tablet   QUEtiapine (SEROQUEL) 50 MG tablet

## 2020-11-26 NOTE — Telephone Encounter (Signed)
Medication list reviewed.  Will change Seroquel to 300 mg take 1/2 tablet twice a day and one tablet at bedtime.  This way we won't have to dispense 360 tablets, placed a call to Ms. Hunt regarding the above, she verbalizes understanding and in agreement.

## 2020-11-26 NOTE — Telephone Encounter (Signed)
Referrals made to [facility] PT, OT, SLP.

## 2020-11-26 NOTE — Telephone Encounter (Signed)
Prior authorization for Seroquel has been approved request Reference Number: [id number]. QUETIAPINE TAB 300MG  is approved through 11/26/2021

## 2020-11-26 NOTE — Telephone Encounter (Signed)
Klonopin is filled by [facility] with physical medicine.  I was going to send in seroquel, but it look slike this was filled by [facility], and it is at a dose larger than what we usually prescribe for sleep. Our med list has a 300 mg dose, but the refill request was for 50 mg.

## 2020-11-26 NOTE — Addendum Note (Signed)
Addended by: Jones Bales on: 11/26/2020 11:13 AM   Modules accepted: Orders

## 2020-11-27 ENCOUNTER — Telehealth: Payer: Self-pay | Admitting: Physical Medicine & Rehabilitation

## 2020-11-27 NOTE — Telephone Encounter (Signed)
Charles Weeks, patients sister needs to have Dr. Riley Kill put a referral in for Home Health.  Patient not able to get out and do outpatient therapy.  It's hard for her to get him anywhere and she had discussed this with Dr. Riley Kill.

## 2020-11-27 NOTE — Telephone Encounter (Signed)
I contacted the patient and spoke to sister [name].  Per sister [name] [facility]) says he was discharged from these physicians at the hospital and Mr Wallace Cullens is functioning as his primary care so must continue his continuing care and is no longer under the other providers care, if the nurse could please contact the sister [name] (POA).  She has a college degree as a Marine scientist.  If can not get these filled she will be more than happy to come in and speak face to face to explain to the NP Mr Wallace Cullens or will have the other providers to contact Mr Wallace Cullens.   Please contact sister back # [phone].

## 2020-11-27 NOTE — Telephone Encounter (Signed)
He gets controlled meds from psychiatry, but wants me to fill them. We don't do controlled meds for anxiety.

## 2020-11-28 ENCOUNTER — Telehealth: Payer: Self-pay

## 2020-11-28 ENCOUNTER — Other Ambulatory Visit: Payer: Self-pay

## 2020-11-28 DIAGNOSIS — F419 Anxiety disorder, unspecified: Secondary | ICD-10-CM

## 2020-11-28 MED ORDER — CLONAZEPAM 0.5 MG PO TABS: 0.5000 mg | ORAL_TABLET | Freq: Every day | ORAL | 1 refills | Status: DC

## 2020-11-28 MED ORDER — QUETIAPINE FUMARATE 300 MG PO TABS: ORAL_TABLET | ORAL | 2 refills | Status: DC

## 2020-11-28 NOTE — Telephone Encounter (Signed)
Dina Rich is the sister of Charles Weeks and his care taker.  [phone] - She said she has been working with Dr Riley Kill in [place] for his Rehab as well as others.  She is requesting the Klonopin refill that he is currently on.  I told her you did not give this and he would need to get this from his Phsy Dr.  She said he does not have a Phsy Dr and needs a referral.  I will go ahead talk with Morrie Sheldon to enter this referral if it is ok to do that when Morrie Sheldon looks at this.  I can get him scheduled next week.  Please call her and address the Klonopin issue could have other questions about his meds. Friday am.  Thanks

## 2020-11-28 NOTE — Telephone Encounter (Signed)
[name] (Sister) called:   Mr. Dais PCP will not take on the responsibility of refilling the Seroquel or Klonopin. [name] wants to know who will take of the future refills?  He only has #9 pills of Klonopin on hand.  Please advise or prescribe? Call back phone [phone].

## 2020-11-28 NOTE — Telephone Encounter (Signed)
I will take on for now. We'll address at his follow up appt with me. RF's sent to pharmacy

## 2020-11-29 NOTE — Telephone Encounter (Signed)
Let's set that up as an appointment. If I have to get a history and write prescriptions we need to charge for that.

## 2020-11-29 NOTE — Telephone Encounter (Signed)
[name] sister of patient notified.

## 2020-12-06 ENCOUNTER — Telehealth (INDEPENDENT_AMBULATORY_CARE_PROVIDER_SITE_OTHER): Payer: Medicaid Other | Admitting: Licensed Clinical Social Worker

## 2020-12-06 ENCOUNTER — Other Ambulatory Visit: Payer: Self-pay

## 2020-12-06 DIAGNOSIS — F418 Other specified anxiety disorders: Secondary | ICD-10-CM

## 2020-12-06 DIAGNOSIS — F419 Anxiety disorder, unspecified: Secondary | ICD-10-CM

## 2020-12-10 ENCOUNTER — Ambulatory Visit
Admission: RE | Admit: 2020-12-10 | Discharge: 2020-12-10 | Disposition: A | Discharge status: Home / Self Care | Payer: Medicaid Other | Source: Ambulatory Visit | Attending: Neurological Surgery | Admitting: Neurological Surgery

## 2020-12-10 ENCOUNTER — Other Ambulatory Visit: Payer: Self-pay

## 2020-12-10 DIAGNOSIS — S22068S Other fracture of T7-T8 thoracic vertebra, sequela: Secondary | ICD-10-CM

## 2020-12-12 ENCOUNTER — Telehealth: Payer: Self-pay | Admitting: Licensed Clinical Social Worker

## 2020-12-12 DIAGNOSIS — F419 Anxiety disorder, unspecified: Secondary | ICD-10-CM

## 2020-12-12 NOTE — BH Specialist Note (Signed)
Virtual Behavioral Health Treatment Plan Team Note  MRN: [id number] NAME: Charles Weeks  DATE: 12/12/20  Start time: 330p  End time:  340p Total time:  10 min  Total number of Virtual BH Treatment Team Plan encounters: 1/4  Treatment Team Attendees: Nolon Rod, LCSW & Dr Vanetta Shawl, Psychiatrist  Diagnoses: No diagnosis found.  Goals, Interventions and Follow-up Plan Goals: Increase healthy adjustment to current life circumstances Interventions: Supportive Counseling Medication Management Recommendations: refer to [facility] Follow-up Plan: Refer to Psychiatrist for Medication Management; refer to [facility]  History of the present illness Presenting Problem/Current Symptoms: symptoms of his DX  Psychiatric History  Depression: Yes Anxiety: Yes Mania: No Psychosis: No PTSD symptoms: No  Past Psychiatric History/Hospitalization(s): Hospitalization for psychiatric illness: No Prior Suicide Attempts: No Prior Self-injurious behavior: No  Psychosocial stressors none at this time  Self-harm Behaviors Risk Assessment   Screenings PHQ-9 Assessments:  Depression screen [facility] 2/9 11/15/2020 11/05/2020 04/16/2020  Decreased Interest 1 0 2  Down, Depressed, Hopeless 2 3 2   PHQ - 2 Score 3 3 4   Altered sleeping 0 0 2  Tired, decreased energy 3 3 2   Change in appetite 0 0 1  Feeling bad or failure about yourself  0 0 0  Trouble concentrating 0 0 0  Moving slowly or fidgety/restless 0 0 0  Suicidal thoughts 0 3 0  PHQ-9 Score 6 9 9   Difficult doing work/chores - Very difficult -   GAD-7 Assessments:  GAD 7 : Generalized Anxiety Score 03/06/2020 01/19/2020 12/08/2019  Nervous, Anxious, on Edge 0 1 1  Control/stop worrying 1 1 0  Worry too much - different things 1 1 0  Trouble relaxing 3 1 1   Restless 1 1 1   Easily annoyed or irritable - 1 0  Afraid - awful might happen 1 0 0  Total GAD 7 Score - 6 3  Anxiety Difficulty Not difficult at all Somewhat difficult Somewhat  difficult    Past Medical History Past Medical History:  Diagnosis Date   Anxiety    Anxiety 01/19/2020   Arthritis    Benign prostatic hyperplasia with weak urinary stream 12/08/2019   BPH (benign prostatic hyperplasia) 08/17/2020   CAD (coronary artery disease) 08/17/2020   Chronic respiratory failure with hypoxia (HCC) 01/24/2020   Has home 02 but not using as of 01/24/2020  -  01/24/2020   Walked RA  approx   200 ft  @ slow pace  stopped due to  Dizzy with sats still 95%      Cigarette smoker 01/24/2020   Stopped regular smoking 10/2019 but still "now and then" as of 01/24/2020    COPD (chronic obstructive pulmonary disease) (HCC)    COPD (chronic obstructive pulmonary disease) (HCC) 08/17/2020   gold   COPD GOLD ?     Quit smoking 10/2019  - Labs ordered 01/24/2020  :  allergy profile   alpha one AT phenotype   - 01/24/2020  After extensive coaching inhaler device,  effectiveness =    75% (short Ti) > change symb 80 to 160 2bid      Coronary artery disease 06/08/2018   DES to RCA, [facility], 11/2019)   Depression    Depression, major, single episode, moderate (HCC) 01/19/2020   Encounter for screening for malignant neoplasm of colon 12/08/2019   Essential hypertension    Essential hypertension 12/08/2019   History of MI (myocardial infarction) 01/19/2020   HTN (hypertension) 08/17/2020   Myocardial infarct (HCC)  Myocardial infarct (HCC)    Respiratory failure with hypoxia (HCC)     Vital signs: There were no vitals filed for this visit.  Allergies:  Allergies as of 12/12/2020   (No Known Allergies)    Medication History Current medications:  Outpatient Encounter Medications as of 12/12/2020  Medication Sig   acetaminophen (TYLENOL) 325 MG tablet Take 2 tablets (650 mg total) by mouth every 6 (six) hours as needed for mild pain or moderate pain.   albuterol (PROAIR HFA) 108 (90 Base) MCG/ACT inhaler Inhale 1-2 puffs into the lungs every 4 (four) hours as needed for  wheezing or shortness of breath.   albuterol (PROAIR HFA) 108 (90 Base) MCG/ACT inhaler Inhale 1-2 puffs into the lungs every 4 (four) hours as needed for wheezing or shortness of breath.   albuterol (PROVENTIL) (2.5 MG/3ML) 0.083% nebulizer solution Take 3 mLs (2.5 mg total) by nebulization every 6 (six) hours as needed for wheezing or shortness of breath.   aspirin 81 MG chewable tablet Chew 1 tablet (81 mg total) by mouth daily.   aspirin EC 81 MG tablet Take 1 tablet (81 mg total) by mouth daily. Swallow whole.   bethanechol (URECHOLINE) 25 MG tablet Take 2 tablets (50 mg total) by mouth 4 (four) times daily.   buPROPion (WELLBUTRIN XL) 300 MG 24 hr tablet Take 1 tablet (300 mg total) by mouth daily.   busPIRone (BUSPAR) 10 MG tablet Take 1 tablet (10 mg total) by mouth 3 (three) times daily.   clonazePAM (KLONOPIN) 0.5 MG tablet Take 1 tablet (0.5 mg total) by mouth at bedtime.   docusate sodium (COLACE) 100 MG capsule Take 1 capsule (100 mg total) by mouth daily.   folic acid (FOLVITE) 1 MG tablet Take 1 tablet (1 mg total) by mouth daily.   lidocaine (LIDODERM) 5 % Place 1 patch onto the skin daily. Remove & Discard patch within 12 hours or as directed by MD   mometasone-formoterol (DULERA) 200-5 MCG/ACT AERO Inhale 2 puffs into the lungs 2 (two) times daily.   montelukast (SINGULAIR) 10 MG tablet Take 1 tablet (10 mg total) by mouth at bedtime.   Multiple Vitamin (MULTIVITAMIN WITH MINERALS) TABS tablet Place 1 tablet into feeding tube daily.   Multiple Vitamin (MULTIVITAMIN WITH MINERALS) TABS tablet Take 1 tablet by mouth daily.   nitroGLYCERIN (NITROSTAT) 0.4 MG SL tablet Place 1 tablet (0.4 mg total) under the tongue every 5 (five) minutes as needed for chest pain.   oxyCODONE (OXY IR/ROXICODONE) 5 MG immediate release tablet Take 1 tablet (5 mg total) by mouth every 6 (six) hours as needed for moderate pain.   OXYGEN Inhale 2 L into the lungs as needed (shortness of breath).    pantoprazole (PROTONIX) 40 MG tablet Take 1 tablet (40 mg total) by mouth daily.   polyethylene glycol (MIRALAX / GLYCOLAX) 17 g packet Take 17 g by mouth daily.   predniSONE (DELTASONE) 5 MG tablet Take 1 tablet (5 mg total) by mouth daily with breakfast.   QUEtiapine (SEROQUEL) 300 MG tablet Take 1/2 Tablet twice a day and one tablet at bedtime   scopolamine (TRANSDERM-SCOP) 1 MG/3DAYS Place 1 patch (1.5 mg total) onto the skin every 3 (three) days.   tamsulosin (FLOMAX) 0.4 MG CAPS capsule Take 1 capsule (0.4 mg total) by mouth 2 (two) times daily.   terazosin (HYTRIN) 2 MG capsule Take 1 capsule (2 mg total) by mouth daily.   [DISCONTINUED] glycopyrrolate (ROBINUL) 1 MG tablet Take 1  tablet (1 mg total) by mouth 3 (three) times daily.   No facility-administered encounter medications on file as of 12/12/2020.     Scribe for Treatment Team: Marinda Elk, LCSW

## 2020-12-12 NOTE — Progress Notes (Signed)
[facility] Psychiatric Consultant Case Review   Charles Weeks is a 55 y.o. year old male with a history of depression, anxiety, COPD, CAD. He had recent admission due to fall from second to the first floor, resulted in vertebral fracture, , s/p tracheostomy. Worsening in depression, staying in the room most of the time, according to the sister, although the baseline is unclear given his sister did not used to live with him prior to this fall.  There has been a concern of his medication not being prescribed by the provider after discharge.   Assessment/Provisional Diagnosis # MDD, recurrent He will benefit from evaluation by psychiatry given complexity of his medical condition/worsening in depression, and limited communication with [facility] specialist.   Recommendation - Referral to psychiatry  - he is on  bupropion 300 mg daily, Buspar 10 mg three times a day, quetiapine 150 mg twice a day, clonazepam 0.5 mg daily  Thank you for your consult. We will sign off this patient. Please contact [facility]  for any questions or concerns.   The above treatment considerations and suggestions are based on consultation with the [facility] specialist and/or PCP and a review of information available in the shared registry and the patient's Electronic Health Record (EHR). I have not personally examined the patient. All recommendations should be implemented with consideration of the patient's relevant prior history and current clinical status. Please feel free to call me with any questions about the care of this patient.

## 2020-12-12 NOTE — Progress Notes (Signed)
[facility] Initial Clinical Assessment  MRN: [id number] NAME: Nicolai Labonte Date: 12/12/20  Start time: 2p  End time: 220p Total time: 20  Type of Contact: video  Patient consent obtained: Yes  Referring Provider: Dr. Allena Katz Reason for Visit today: initiate service  Treatment History Patient recently received Inpatient Treatment: No    Patient currently being seen by therapist/psychiatrist: No  Patient currently receiving the following services: none  Past Psychiatric History/Diagnosis/Hospitalization(s): Anxiety: Yes Bipolar Disorder: No Depression: Yes Mania: No Psychosis: No Schizophrenia: No Personality Disorder: No Hospitalization for psychiatric illness: No History of Electroconvulsive Shock Therapy: No Prior Suicide Attempts: No  Decreased need for sleep: No  Euphoria: No  Self Injurious behaviors: No  Family History of mental illness: No  Family History of substance abuse: No  Substance Abuse: No  DUI: No  Insomnia: No   History of violence: No  Physical, sexual or emotional abuse: No  Prior outpatient mental health therapy: No   Clinical Assessment  PHQ-9 & GAD-7 Assessments: Unable to get a read; will attempt at the next session   Social Functioning Social maturity: WNL Social judgement: WNL  Stress Current stressors: unable to communicate as he would like Familial stressors: n/a  Sleep: WNL  Appetite: WNL Coping ability: overwhelmed Patient taking medications as prescribed:  yes  Current medications:  Outpatient Encounter Medications as of 12/06/2020  Medication Sig   acetaminophen (TYLENOL) 325 MG tablet Take 2 tablets (650 mg total) by mouth every 6 (six) hours as needed for mild pain or moderate pain.   albuterol (PROAIR HFA) 108 (90 Base) MCG/ACT inhaler Inhale 1-2 puffs into the lungs every 4 (four) hours as needed for wheezing or shortness of breath.   albuterol (PROAIR HFA) 108 (90 Base) MCG/ACT inhaler Inhale 1-2 puffs  into the lungs every 4 (four) hours as needed for wheezing or shortness of breath.   albuterol (PROVENTIL) (2.5 MG/3ML) 0.083% nebulizer solution Take 3 mLs (2.5 mg total) by nebulization every 6 (six) hours as needed for wheezing or shortness of breath.   aspirin 81 MG chewable tablet Chew 1 tablet (81 mg total) by mouth daily.   aspirin EC 81 MG tablet Take 1 tablet (81 mg total) by mouth daily. Swallow whole.   bethanechol (URECHOLINE) 25 MG tablet Take 2 tablets (50 mg total) by mouth 4 (four) times daily.   buPROPion (WELLBUTRIN XL) 300 MG 24 hr tablet Take 1 tablet (300 mg total) by mouth daily.   busPIRone (BUSPAR) 10 MG tablet Take 1 tablet (10 mg total) by mouth 3 (three) times daily.   clonazePAM (KLONOPIN) 0.5 MG tablet Take 1 tablet (0.5 mg total) by mouth at bedtime.   docusate sodium (COLACE) 100 MG capsule Take 1 capsule (100 mg total) by mouth daily.   folic acid (FOLVITE) 1 MG tablet Take 1 tablet (1 mg total) by mouth daily.   lidocaine (LIDODERM) 5 % Place 1 patch onto the skin daily. Remove & Discard patch within 12 hours or as directed by MD   mometasone-formoterol (DULERA) 200-5 MCG/ACT AERO Inhale 2 puffs into the lungs 2 (two) times daily.   montelukast (SINGULAIR) 10 MG tablet Take 1 tablet (10 mg total) by mouth at bedtime.   Multiple Vitamin (MULTIVITAMIN WITH MINERALS) TABS tablet Place 1 tablet into feeding tube daily.   Multiple Vitamin (MULTIVITAMIN WITH MINERALS) TABS tablet Take 1 tablet by mouth daily.   nitroGLYCERIN (NITROSTAT) 0.4 MG SL tablet Place 1 tablet (0.4 mg total) under  the tongue every 5 (five) minutes as needed for chest pain.   oxyCODONE (OXY IR/ROXICODONE) 5 MG immediate release tablet Take 1 tablet (5 mg total) by mouth every 6 (six) hours as needed for moderate pain.   OXYGEN Inhale 2 L into the lungs as needed (shortness of breath).   pantoprazole (PROTONIX) 40 MG tablet Take 1 tablet (40 mg total) by mouth daily.   polyethylene glycol (MIRALAX  / GLYCOLAX) 17 g packet Take 17 g by mouth daily.   predniSONE (DELTASONE) 5 MG tablet Take 1 tablet (5 mg total) by mouth daily with breakfast.   QUEtiapine (SEROQUEL) 300 MG tablet Take 1/2 Tablet twice a day and one tablet at bedtime   scopolamine (TRANSDERM-SCOP) 1 MG/3DAYS Place 1 patch (1.5 mg total) onto the skin every 3 (three) days.   tamsulosin (FLOMAX) 0.4 MG CAPS capsule Take 1 capsule (0.4 mg total) by mouth 2 (two) times daily.   terazosin (HYTRIN) 2 MG capsule Take 1 capsule (2 mg total) by mouth daily.   No facility-administered encounter medications on file as of 12/06/2020.     Self-harm and/or Suicidal Behaviors Risk Assessment Self-harm risk factors: n/a Patient endorses recent self injurious thoughts and/or behaviors: No  Suicide ideations: No plan to harm self or others   Danger to Others Risk Assessment Danger to others risk factors: n/a Patient endorses recent thoughts of harming others: No    Substance Use Assessment Patient recently consumed alcohol: No  Patient recently used drugs: No  Patient is concerned about dependence or abuse of substances: No    Goals, Interventions and Follow-up Plan Goals: Increase healthy adjustment to current life circumstances Interventions: Supportive Counseling Follow-up Plan: Refer to Psychiatrist for Medication Management  Summary of Clinical Assessment Summary: Fowler is a 55 yr old man that was referred by his PCP Dr. Allena Katz.  Abdoulie was accompanied by his sister. Othell was in an incident where he fell and broke his back and other medical concerns. Darrie was on antidepressant medication prior to his fall.  His sister wants him to get connected to Psychiatry so refill his medication as needed.  Ancelmo is unable to vocalize needs but is able to use gestures to communicate as he has a trachea to assist with breathing. He nodded to affirm what his sister was discussing.  His Sister went into detail about his care and her goals for  him.   Will refer to Psychiatry and check in with him to ensure he is connected.   Marinda Elk, LCSW

## 2020-12-14 ENCOUNTER — Telehealth (INDEPENDENT_AMBULATORY_CARE_PROVIDER_SITE_OTHER): Payer: Medicaid Other | Admitting: Internal Medicine

## 2020-12-14 ENCOUNTER — Encounter: Payer: Self-pay | Admitting: Internal Medicine

## 2020-12-14 ENCOUNTER — Telehealth: Payer: Medicaid Other | Admitting: Nurse Practitioner

## 2020-12-14 ENCOUNTER — Inpatient Hospital Stay: Payer: Medicaid Other | Admitting: Internal Medicine

## 2020-12-14 ENCOUNTER — Other Ambulatory Visit: Payer: Self-pay

## 2020-12-14 DIAGNOSIS — F418 Other specified anxiety disorders: Secondary | ICD-10-CM

## 2020-12-14 DIAGNOSIS — Z930 Tracheostomy status: Secondary | ICD-10-CM

## 2020-12-14 DIAGNOSIS — R053 Chronic cough: Secondary | ICD-10-CM | POA: Insufficient documentation

## 2020-12-14 DIAGNOSIS — J449 Chronic obstructive pulmonary disease, unspecified: Secondary | ICD-10-CM

## 2020-12-14 DIAGNOSIS — S22000G Wedge compression fracture of unspecified thoracic vertebra, subsequent encounter for fracture with delayed healing: Secondary | ICD-10-CM | POA: Diagnosis not present

## 2020-12-14 DIAGNOSIS — W108XXA Fall (on) (from) other stairs and steps, initial encounter: Secondary | ICD-10-CM | POA: Insufficient documentation

## 2020-12-14 NOTE — Assessment & Plan Note (Signed)
CT thoracic spine reviewed Planned for surgical repair

## 2020-12-14 NOTE — Assessment & Plan Note (Signed)
Well-controlled with Dulera and PRN Albuterol F/u with Pulmonology

## 2020-12-14 NOTE — Progress Notes (Signed)
Virtual Visit via Video Note   This visit type was conducted due to national recommendations for restrictions regarding the COVID-19 Pandemic (e.g. social distancing) in an effort to limit this patient's exposure and mitigate transmission in our community.  Due to his co-morbid illnesses, this patient is at least at moderate risk for complications without adequate follow up.  This format is felt to be most appropriate for this patient at this time.  All issues noted in this document were discussed and addressed.  A limited physical exam was performed with this format.     Evaluation Performed:  Follow-up visit  Date:  12/14/2020   ID:  Charles Weeks, DOB 09-06-1965, MRN [id number]  Patient Location: Home Provider Location: Office/Clinic  Participants: Patient and sister Location of Patient: Home Location of Provider: Telehealth Consent was obtain for visit to be over via telehealth. I verified that I am speaking with the correct person using two identifiers.  PCP:  Anabel Halon, MD   Chief Complaint:    History of Present Illness:    Charles Weeks is a 55 y.o. male with PMH of depression with anxiety and multiple ribs and thoracic vertebral fractures who has a televisit for discussion of medications for anxiety and depression.  He has been having televisits with [facility] therapist, but has not been able to see Psychiatrist yet. Sister wants him to be seen by Psychiatrist as well. He has been doing well with Seroquel and Clonazepam.  He is planned to have surgery for multiple fractures in the next month.   He has been using Dulera and Albuterol (nebs) for COPD. He also has been trying deep breathing exercises.  The patient does not have symptoms concerning for COVID-19 infection (fever, chills, cough, or new shortness of breath).   Past Medical, Surgical, Social History, Allergies, and Medications have been Reviewed.  Past Medical History:  Diagnosis Date   Anxiety     Anxiety 01/19/2020   Arthritis    Benign prostatic hyperplasia with weak urinary stream 12/08/2019   BPH (benign prostatic hyperplasia) 08/17/2020   CAD (coronary artery disease) 08/17/2020   Chronic respiratory failure with hypoxia (HCC) 01/24/2020   Has home 02 but not using as of 01/24/2020  -  01/24/2020   Walked RA  approx   200 ft  @ slow pace  stopped due to  Dizzy with sats still 95%      Cigarette smoker 01/24/2020   Stopped regular smoking 10/2019 but still "now and then" as of 01/24/2020    COPD (chronic obstructive pulmonary disease) (HCC)    COPD (chronic obstructive pulmonary disease) (HCC) 08/17/2020   gold   COPD GOLD ?     Quit smoking 10/2019  - Labs ordered 01/24/2020  :  allergy profile   alpha one AT phenotype   - 01/24/2020  After extensive coaching inhaler device,  effectiveness =    75% (short Ti) > change symb 80 to 160 2bid      Coronary artery disease 06/08/2018   DES to RCA, [facility] [place])   Depression    Depression, major, single episode, moderate (HCC) 01/19/2020   Encounter for screening for malignant neoplasm of colon 12/08/2019   Essential hypertension    Essential hypertension 12/08/2019   History of MI (myocardial infarction) 01/19/2020   HTN (hypertension) 08/17/2020   Myocardial infarct [facility])    Myocardial infarct (HCC)    Respiratory failure with hypoxia [facility])    Past Surgical  History:  Procedure Laterality Date   BOWEL RESECTION  2006   PEG PLACEMENT N/A 09/03/2020   Procedure: PERCUTANEOUS ENDOSCOPIC GASTROSTOMY (PEG) PLACEMENT;  Surgeon: Violeta Gelinas, MD;  Location: [facility] OR;  Service: General;  Laterality: N/A;   PERCUTANEOUS TRACHEOSTOMY N/A 09/03/2020   Procedure: PERCUTANEOUS TRACHEOSTOMY USING 6 SHILEY;  Surgeon: Violeta Gelinas, MD;  Location: [facility] OR;  Service: General;  Laterality: N/A;   SKIN GRAFT     TONSILLECTOMY AND ADENOIDECTOMY       Current Meds  Medication Sig   acetaminophen (TYLENOL) 325 MG tablet Take 2 tablets (650 mg  total) by mouth every 6 (six) hours as needed for mild pain or moderate pain.   albuterol (PROAIR HFA) 108 (90 Base) MCG/ACT inhaler Inhale 1-2 puffs into the lungs every 4 (four) hours as needed for wheezing or shortness of breath.   albuterol (PROVENTIL) (2.5 MG/3ML) 0.083% nebulizer solution Take 3 mLs (2.5 mg total) by nebulization every 6 (six) hours as needed for wheezing or shortness of breath.   aspirin 81 MG chewable tablet Chew 1 tablet (81 mg total) by mouth daily.   bethanechol (URECHOLINE) 25 MG tablet Take 2 tablets (50 mg total) by mouth 4 (four) times daily.   buPROPion (WELLBUTRIN XL) 300 MG 24 hr tablet Take 1 tablet (300 mg total) by mouth daily.   busPIRone (BUSPAR) 10 MG tablet Take 1 tablet (10 mg total) by mouth 3 (three) times daily.   clonazePAM (KLONOPIN) 0.5 MG tablet Take 1 tablet (0.5 mg total) by mouth at bedtime.   folic acid (FOLVITE) 1 MG tablet Take 1 tablet (1 mg total) by mouth daily.   mometasone-formoterol (DULERA) 200-5 MCG/ACT AERO Inhale 2 puffs into the lungs 2 (two) times daily.   montelukast (SINGULAIR) 10 MG tablet Take 1 tablet (10 mg total) by mouth at bedtime.   nitroGLYCERIN (NITROSTAT) 0.4 MG SL tablet Place 1 tablet (0.4 mg total) under the tongue every 5 (five) minutes as needed for chest pain.   oxyCODONE (OXY IR/ROXICODONE) 5 MG immediate release tablet Take 1 tablet (5 mg total) by mouth every 6 (six) hours as needed for moderate pain.   OXYGEN Inhale 2 L into the lungs as needed (shortness of breath).   pantoprazole (PROTONIX) 40 MG tablet Take 1 tablet (40 mg total) by mouth daily.   predniSONE (DELTASONE) 5 MG tablet Take 1 tablet (5 mg total) by mouth daily with breakfast.   QUEtiapine (SEROQUEL) 300 MG tablet Take 1/2 Tablet twice a day and one tablet at bedtime   scopolamine (TRANSDERM-SCOP) 1 MG/3DAYS Place 1 patch (1.5 mg total) onto the skin every 3 (three) days.   tamsulosin (FLOMAX) 0.4 MG CAPS capsule Take 1 capsule (0.4 mg total)  by mouth 2 (two) times daily.   terazosin (HYTRIN) 2 MG capsule Take 1 capsule (2 mg total) by mouth daily.   [DISCONTINUED] albuterol (PROAIR HFA) 108 (90 Base) MCG/ACT inhaler Inhale 1-2 puffs into the lungs every 4 (four) hours as needed for wheezing or shortness of breath.   [DISCONTINUED] aspirin EC 81 MG tablet Take 1 tablet (81 mg total) by mouth daily. Swallow whole.     Allergies:   Patient has no known allergies.   ROS:   Please see the history of present illness.     All other systems reviewed and are negative.   Labs/Other Tests and Data Reviewed:    Recent Labs: 09/05/2020: Magnesium 2.0 10/10/2020: ALT 27; BUN 17; Creatinine, Ser 0.85; Potassium 3.9;  Sodium 139 10/19/2020: Hemoglobin 12.0; Platelets 345   Recent Lipid Panel Lab Results  Component Value Date/Time   CHOL 158 11/07/2019 04:46 AM   TRIG 140 09/05/2020 05:10 AM   HDL 45 11/07/2019 04:46 AM   CHOLHDL 3.5 11/07/2019 04:46 AM   LDLCALC 81 11/07/2019 04:46 AM    Wt Readings from Last 3 Encounters:  11/15/20 180 lb 9.6 oz (81.9 kg)  11/05/20 176 lb 6.4 oz (80 kg)  10/14/20 180 lb 8.9 oz (81.9 kg)     ASSESSMENT & PLAN:    Thoracic spine fracture (HCC) CT thoracic spine reviewed Planned for surgical repair  Depression with anxiety Well-controlled with Seroquel and Clonazepam Will follow up with Psychiatry referral Currently refilled by PMNR  COPD GOLD IV  Well-controlled with Dulera and PRN Albuterol F/u with Pulmonology  Tracheostomy in place [facility]) S/p prolonged intubation Followed by Pulmonology and PMNR   Time:   Today, I have spent 16 minutes reviewing the chart, including problem list, medications, and with the patient with telehealth technology discussing the above problems.   Medication Adjustments/Labs and Tests Ordered: Current medicines are reviewed at length with the patient today.  Concerns regarding medicines are outlined above.   Tests Ordered: No orders of the defined  types were placed in this encounter.   Medication Changes: No orders of the defined types were placed in this encounter.    Note: This dictation was prepared with Dragon dictation along with smaller phrase technology. Similar sounding words can be transcribed inadequately or may not be corrected upon review. Any transcriptional errors that result from this process are unintentional.      Disposition:  Follow up  Signed, Anabel Halon, MD  12/14/2020 11:03 AM     [facility]

## 2020-12-14 NOTE — Assessment & Plan Note (Signed)
S/p prolonged intubation Followed by Pulmonology and PMNR

## 2020-12-14 NOTE — Assessment & Plan Note (Signed)
Well-controlled with Seroquel and Clonazepam Will follow up with Psychiatry referral Currently refilled by [facility]

## 2020-12-17 ENCOUNTER — Other Ambulatory Visit: Payer: Self-pay | Admitting: Internal Medicine

## 2020-12-18 DIAGNOSIS — J398 Other specified diseases of upper respiratory tract: Secondary | ICD-10-CM | POA: Insufficient documentation

## 2020-12-18 NOTE — Progress Notes (Signed)
Cardiology Office Note    Date:  12/25/2020   ID:  [name], DOB 1965-11-17, MRN [id number]   PCP:  Anabel Halon, MD   [facility]  Cardiologist:  Nona Dell, MD   Advanced Practice Provider:  No care team member to display Electrophysiologist:  None   [phone]   No chief complaint on file.   History of Present Illness:  Charles Weeks is a 55 y.o. male with history of CAD status post DES to the RCA 05/2018, hypertension, COPD, quit smoking.    Last saw Dr. Diona Browner 06/2020 and was doing well.  Patient comes in accompanied by his sister. April 1 patient fell over a second story wall of his home and fell head first  to first floor over knee wall and broke his neck/back, 8 ribs, punctured lung, laceration of head but no skull fracture. He had 4-5 beers prior to it happening. Also had narcotics in system that weren't prescribed. Sister is concerned he had a heart attack because troponins were 122, 290 on 08/18/20 same day he was intubated in the setting of metabolic and resp acidsosis and encephalopathy/AKI.  Having trach reversal Aug 31.Echo 08/18/20 trivial pericardial effusion, normal LVEF 55-60%  Patient says he has had some chest tightness relieved with NTG every coupe months. Most of the time with activity. No radiation of pain, says he's SOB all the time. Not smoking since fall.   Past Medical History:  Diagnosis Date   Anxiety    Anxiety 01/19/2020   Arthritis    Benign prostatic hyperplasia with weak urinary stream 12/08/2019   BPH (benign prostatic hyperplasia) 08/17/2020   CAD (coronary artery disease) 08/17/2020   Chronic respiratory failure with hypoxia (HCC) 01/24/2020   Has home 02 but not using as of 01/24/2020  -  01/24/2020   Walked RA  approx   200 ft  @ slow pace  stopped due to  Dizzy with sats still 95%      Cigarette smoker 01/24/2020   Stopped regular smoking 10/2019 but still "now and then" as of 01/24/2020    COPD (chronic  obstructive pulmonary disease) (HCC)    COPD (chronic obstructive pulmonary disease) (HCC) 08/17/2020   gold   COPD GOLD ?     Quit smoking 10/2019  - Labs ordered 01/24/2020  :  allergy profile   alpha one AT phenotype   - 01/24/2020  After extensive coaching inhaler device,  effectiveness =    75% (short Ti) > change symb 80 to 160 2bid      Coronary artery disease 06/08/2018   DES to RCA, [facility] [place] [place])   Depression    Depression, major, single episode, moderate (HCC) 01/19/2020   Encounter for screening for malignant neoplasm of colon 12/08/2019   Essential hypertension    Essential hypertension 12/08/2019   History of MI (myocardial infarction) 01/19/2020   HTN (hypertension) 08/17/2020   Myocardial infarct [facility])    Myocardial infarct (HCC)    Respiratory failure with hypoxia [facility])     Past Surgical History:  Procedure Laterality Date   BOWEL RESECTION  2006   PEG PLACEMENT N/A 09/03/2020   Procedure: PERCUTANEOUS ENDOSCOPIC GASTROSTOMY (PEG) PLACEMENT;  Surgeon: Violeta Gelinas, MD;  Location: [facility] OR;  Service: General;  Laterality: N/A;   PERCUTANEOUS TRACHEOSTOMY N/A 09/03/2020   Procedure: PERCUTANEOUS TRACHEOSTOMY USING 6 SHILEY;  Surgeon: Violeta Gelinas, MD;  Location: [facility] OR;  Service: General;  Laterality: N/A;  SKIN GRAFT     TONSILLECTOMY AND ADENOIDECTOMY      Current Medications: Current Meds  Medication Sig   acetaminophen (TYLENOL) 325 MG tablet Take 2 tablets (650 mg total) by mouth every 6 (six) hours as needed for mild pain or moderate pain.   albuterol (PROAIR HFA) 108 (90 Base) MCG/ACT inhaler Inhale 1-2 puffs into the lungs every 4 (four) hours as needed for wheezing or shortness of breath.   albuterol (PROVENTIL) (2.5 MG/3ML) 0.083% nebulizer solution Take 3 mLs (2.5 mg total) by nebulization every 6 (six) hours as needed for wheezing or shortness of breath.   aspirin 81 MG chewable tablet Chew 1 tablet (81 mg total) by mouth daily.    bethanechol (URECHOLINE) 25 MG tablet TAKE TWO TABLETS BY MOUTH FOUR TIMES DAILY   budesonide-formoterol (SYMBICORT) 160-4.5 MCG/ACT inhaler Take 2 puffs first thing in am and then another 2 puffs about 12 hours later.   buPROPion (WELLBUTRIN XL) 300 MG 24 hr tablet TAKE 1 TABLET BY MOUTH ONCE DAILY.   busPIRone (BUSPAR) 10 MG tablet TAKE ONE TABLET BY MOUTH 3 TIMES A DAY   clonazePAM (KLONOPIN) 0.5 MG tablet Take 1 tablet (0.5 mg total) by mouth at bedtime.   folic acid (FOLVITE) 1 MG tablet TAKE ONE TABLET BY MOUTH ONCE DAILY.   montelukast (SINGULAIR) 10 MG tablet TAKE ONE TABLET BY MOUTH AT BEDTIME   nitroGLYCERIN (NITROSTAT) 0.4 MG SL tablet Place 1 tablet (0.4 mg total) under the tongue every 5 (five) minutes as needed for chest pain.   oxyCODONE (OXY IR/ROXICODONE) 5 MG immediate release tablet Take 1 tablet (5 mg total) by mouth every 6 (six) hours as needed for moderate pain.   OXYGEN Inhale 2 L into the lungs as needed (shortness of breath).   pantoprazole (PROTONIX) 40 MG tablet Take 1 tablet (40 mg total) by mouth daily.   predniSONE (DELTASONE) 5 MG tablet Take 1 tablet (5 mg total) by mouth daily with breakfast.   QUEtiapine (SEROQUEL) 300 MG tablet Take 1/2 Tablet twice a day and one tablet at bedtime   rosuvastatin (CRESTOR) 5 MG tablet Take 1 tablet (5 mg total) by mouth daily.   scopolamine (TRANSDERM-SCOP) 1 MG/3DAYS Place 1 patch (1.5 mg total) onto the skin every 3 (three) days.   tamsulosin (FLOMAX) 0.4 MG CAPS capsule TAKE (1) CAPSULE BY MOUTH TWICE DAILY.   terazosin (HYTRIN) 2 MG capsule TAKE ONE CAPSULE BY MOUTH ONCE DAILY     Allergies:   Patient has no known allergies.   Social History   Socioeconomic History   Marital status: Single    Spouse name: Not on file   Number of children: 2   Years of education: Not on file   Highest education level: 7th grade  Occupational History   Not on file  Tobacco Use   Smoking status: Former    Packs/day: 1.50    Types:  Cigarettes    Quit date: 08/17/2020    Years since quitting: 0.3   Smokeless tobacco: Never  Vaping Use   Vaping Use: Never used  Substance and Sexual Activity   Alcohol use: Yes   Drug use: Not Currently   Sexual activity: Not on file  Other Topics Concern   Not on file  Social History Narrative   ** Merged History Encounter **       Lives with sister  Both daughters live close by   Sister has dog   Enjoys: shooting pool, fishing, Therapist, nutritional  Diet: eats all food groups Caffeine: pop-dr pepper 2 liter  Water: 4-6 cups   Wears seat belt  No driving for him Smoke detectors  Fi   re Extinguisher No weapons        Social Determinants of Health   Financial Resource Strain: Not on file  Food Insecurity: Not on file  Transportation Needs: Not on file  Physical Activity: Not on file  Stress: Not on file  Social Connections: Not on file     Family History:  The patient's  family history includes Diabetes in his sister; Heart disease in his father and mother.   ROS:   Please see the history of present illness.    ROS All other systems reviewed and are negative.   PHYSICAL EXAM:   VS:  BP 136/84   Pulse (!) 106   Ht 5\' 8"  (1.727 m)   Wt 180 lb (81.6 kg)   SpO2 94%   BMI 27.37 kg/m   Physical Exam  GEN: Well nourished, well developed, in no acute distress/neck and back brace Neck: trach no JVD, carotid bruits, or masses Cardiac:RRR; no murmurs, rubs, or gallops  Respiratory:  clear to auscultation bilaterally, normal work of breathing GI: soft, nontender, nondistended, + BS Ext: without cyanosis, clubbing, or edema, Good distal pulses bilaterally Neuro:  Alert and Oriented x 3, Psych: euthymic mood, full affect  Wt Readings from Last 3 Encounters:  12/25/20 180 lb (81.6 kg)  12/24/20 181 lb (82.1 kg)  11/15/20 180 lb 9.6 oz (81.9 kg)      Studies/Labs Reviewed:   EKG:  EKG is not ordered today.  The ekg reviewed from hospital.  Recent Labs: 09/05/2020:  Magnesium 2.0 10/10/2020: ALT 27; BUN 17; Creatinine, Ser 0.85; Potassium 3.9; Sodium 139 10/19/2020: Hemoglobin 12.0; Platelets 345   Lipid Panel    Component Value Date/Time   CHOL 158 11/07/2019 0446   TRIG 140 09/05/2020 0510   HDL 45 11/07/2019 0446   CHOLHDL 3.5 11/07/2019 0446   VLDL 32 11/07/2019 0446   LDLCALC 81 11/07/2019 0446    Additional studies/ records that were reviewed today include:  Echocardiogram 01/31/2020:  1. Left ventricular ejection fraction, by estimation, is 65 to 70%. The  left ventricle has normal function. The left ventricle has no regional  wall motion abnormalities. Left ventricular diastolic parameters were  normal.   2. Right ventricular systolic function is normal. The right ventricular  size is normal.   3. The mitral valve is normal in structure. No evidence of mitral valve  regurgitation. No evidence of mitral stenosis.   4. The aortic valve has an indeterminant number of cusps. Aortic valve  regurgitation is not visualized. No aortic stenosis is present.   5. The inferior vena cava is normal in size with greater than 50%  respiratory variability, suggesting right atrial pressure of 3 mmHg.     Risk Assessment/Calculations:         ASSESSMENT:    1. Chest pain, unspecified type   2. SOB (shortness of breath)   3. Coronary artery disease involving native heart, unspecified vessel or lesion type, unspecified whether angina present   4. Essential hypertension      PLAN:  In order of problems listed above:  Chest pain described as pressure with and without activity occurs infrequently.  Says it similar to before he had his stents.  Scheduled for trach reversal 01/16/2021.  Will order 01/18/2021.  Shortness of breath chronic with history of  COPD and currently with trach after fall in April.  He did have a trivial pericardial effusion on echo after his fall.  We will follow-up with 2D echo.  CAD status post DES to the RCA 05/2018  now on aspirin -needs to be on a statin-will start crestor 5 mg daily which he was on before. Was on toprol xl 12.5 mg daily but no longer on. Will hold off for now.  History of hypertension on hytrin  Shared Decision Making/Informed Consent   Shared Decision Making/Informed Consent The risks [chest pain, shortness of breath, cardiac arrhythmias, dizziness, blood pressure fluctuations, myocardial infarction, stroke/transient ischemic attack, nausea, vomiting, allergic reaction, radiation exposure, metallic taste sensation and life-threatening complications (estimated to be 1 in 10,000)], benefits (risk stratification, diagnosing coronary artery disease, treatment guidance) and alternatives of a nuclear stress test were discussed in detail with Mr. Belloso and he agrees to proceed.    Medication Adjustments/Labs and Tests Ordered: Current medicines are reviewed at length with the patient today.  Concerns regarding medicines are outlined above.  Medication changes, Labs and Tests ordered today are listed in the Patient Instructions below. Patient Instructions  Medication Instructions:    START Crestor 5 mg daily at dinner or bedtime  *If you need a refill on your cardiac medications before your next appointment, please call your pharmacy*   Lab Work:  FASTING Lipids in 3 months  If you have labs (blood work) drawn today and your tests are completely normal, you will receive your results only by: MyChart Message (if you have MyChart) OR A paper copy in the mail If you have any lab test that is abnormal or we need to change your treatment, we will call you to review the results.   Testing/Procedures: Your physician has requested that you have an echocardiogram. Echocardiography is a painless test that uses sound waves to create images of your heart. It provides your doctor with information about the size and shape of your heart and how well your heart's chambers and valves are working. This  procedure takes approximately one hour. There are no restrictions for this procedure.     Your physician has requested that you have a lexiscan myoview. For further information please visit [email or web address] Please follow instruction sheet, as given.    Follow-Up: At [facility], you and your health needs are our priority.  As part of our continuing mission to provide you with exceptional heart care, we have created designated Provider Care Teams.  These Care Teams include your primary Cardiologist (physician) and Advanced Practice Providers (APPs -  Physician Assistants and Nurse Practitioners) who all work together to provide you with the care you need, when you need it.  We recommend signing up for the patient portal called "MyChart".  Sign up information is provided on this After Visit Summary.  MyChart is used to connect with patients for Virtual Visits (Telemedicine).  Patients are able to view lab/test results, encounter notes, upcoming appointments, etc.  Non-urgent messages can be sent to your provider as well.   To learn more about what you can do with MyChart, go to [email or web address].    Your next appointment:   6 month(s)  The format for your next appointment:   In Person  Provider:   Nona Dell, MD   Other Instructions None      Signed, Jacolyn Reedy, PA-C  12/25/2020 1:53 PM    [facility] [address] [place], [place], [place]  [address] Phone: [phone])  [phone]; Fax: [phone]    

## 2020-12-20 ENCOUNTER — Other Ambulatory Visit (HOSPITAL_COMMUNITY): Payer: Self-pay

## 2020-12-22 ENCOUNTER — Other Ambulatory Visit: Payer: Self-pay | Admitting: Internal Medicine

## 2020-12-24 ENCOUNTER — Other Ambulatory Visit: Payer: Self-pay

## 2020-12-24 ENCOUNTER — Ambulatory Visit (INDEPENDENT_AMBULATORY_CARE_PROVIDER_SITE_OTHER): Payer: Medicaid Other | Admitting: Internal Medicine

## 2020-12-24 ENCOUNTER — Encounter: Payer: Self-pay | Admitting: Internal Medicine

## 2020-12-24 VITALS — BP 122/88 | HR 95 | Temp 98.0°F | Ht 68.0 in | Wt 181.0 lb

## 2020-12-24 DIAGNOSIS — J9611 Chronic respiratory failure with hypoxia: Secondary | ICD-10-CM

## 2020-12-24 DIAGNOSIS — J449 Chronic obstructive pulmonary disease, unspecified: Secondary | ICD-10-CM | POA: Diagnosis not present

## 2020-12-24 MED ORDER — BUDESONIDE-FORMOTEROL FUMARATE 160-4.5 MCG/ACT IN AERO: INHALATION_SPRAY | RESPIRATORY_TRACT | 12 refills | Status: DC

## 2020-12-24 NOTE — Assessment & Plan Note (Addendum)
Quit smoking 08/2020   - 01/24/2020  After extensive coaching inhaler device,  effectiveness =    75% (short Ti) > change symb 80 to 160 2bid  -  PFT's   05/01/20   FEV1 0.97 (27 % ) ratio 0.30  p 1 % improvement from saba p symbicort prior to study with DLCO  11.59 (43%) corrects to 2.0 (45%)  for alv volume and FV curve severe airflow obst with 35% improvement in FVC p saba  - 05/31/2020  After extensive coaching inhaler device,  effectiveness =    Short Ti, prostate problems so rx symbicort 160 2bid and prn saba -  05/31/2020   Walked RA  approx   300 ft  @ moderate pace  stopped due to  Sob at end but sats still 94%    - Labs ordered 12/24/2020  :    alpha one AT phenotype    12/24/2020  After extensive coaching inhaler device,  effectiveness =    No more than 50% thru the trach.  Pt prefers symbicort 160 so switched but can continue to use back up = neb saba prn which for now should do and if not then  neb lama/laba/ics would be my strong preference.    Severe copd s active AB component and trach dep/ 02 dep hs but not daytime so cleared for surgery on his larynx as worse case scenario = he could end up on a trach.

## 2020-12-24 NOTE — Patient Instructions (Addendum)
Please remember to go to the lab department   for your tests - we will call you with the results when they are available.  Ok to change dulera to symbicort 160 Take 2 puffs first thing in am and then another 2 puffs about 12 hours later.        You are cleared for surgery

## 2020-12-24 NOTE — Progress Notes (Signed)
Charles Weeks, male    DOB: 10-23-1965,    MRN: [id number]   Brief patient profile:  55  yowm illiterate though says finished 7th grade with asthma since in his 70s  just quit smoking 08/2020 on disability since last PFT's ? 2017/18 relocated to [facility] summer 2021 and referred to pulmonary clinic in [place]  01/24/2020 by [name].  Admit date: 11/04/2019 Discharge date: 11/10/2019   Admitted From: Home Disposition: Home   Recommendations for Outpatient Follow-up:  Follow up with PCP in 1-2 weeks Please obtain BMP/CBC in one week   Home Health: Equipment/Devices: Home oxygen at 3 L, nebulizer machine   Discharge Condition: Stable CODE STATUS: Full code Diet recommendation: Heart healthy   Brief/Interim Summary: Charles Weeks is a 55 y.o. male with a history of CAD, MI, COPD.  Patient just recently moved to [place] from [place].  He has no medications and has not seen a physician here.  He started having shortness of breath about 3 weeks ago which has been increasing.  He has been having cough with some purulent sputum production.  He had a relative give him some steroids over the past few days, without too much improvement.  No other palliating or provoking factors.  Denies fevers, chills, nausea, vomiting.   Emergency Department Course: Patient started on BiPAP and gradually weaned to nasal cannula.  Initial saturations were in the 80s.  Improved with steroids and BiPAP.  Remained stable on nasal cannula.   Discharge Diagnoses:  Principal Problem:   Acute respiratory failure with hypoxia (HCC) Active Problems:   Coronary artery disease   COPD with acute exacerbation (HCC)   Acute on chronic hypoxic respiratory failure secondary to acute COPD exacerbation Presented with hypoxia with O2 saturation in the 80s initially requiring NIV then transitioned to 3-4 L  -previously on 3L at home but has not used since Oct 2020 -Continue brovana and  pulmicort -Continue hypertonic saline nebs -continue duonebs -He was treated with IV steroids which have since been transitioned to prednisone taper -Personally reviewed chest x-ray which showed no lobular pulmonary infiltrates to suggest pneumonia -Maintain O2 saturation greater than 88 to 90% -TOC consulted to assist with PCP and pulmonology referrals -Currently, he can ambulate comfortably on 3 L of oxygen which appears to be near his baseline   -Acute COPD exacerbation Ongoing tobacco use; tobacco cessation counseling at bedside. COVID-19 negative Treated with IV steroids which was subsequently transitioned to prednisone taper Continue management as stated above -Continue Symbicort on discharge -Continue albuterol nebs as needed, provided with nebulizer machine   Hyperglycemia/impaired glucose tolerance Presented with elevated serum glucose Hemoglobin A1c 6.3 on 619 1221 Monitored on sliding scale insulin   Coronary artery disease status post PCI/history of MI Denies any anginal symptoms at the time of this exam Continue Brilinta  Not on statin, obtain lipid panel   Tobacco use disorder Tobacco cessation counseling Nicotine patch   BPH Continue terazosin Monitor urine output    History of Present Illness  01/24/2020  Pulmonary/ 1st office eval/ Landa Mullinax / [place] Office  Chief Complaint  Patient presents with   Pulmonary Consult    Referred by Tereasa Coop, NP.  Pt states hx of COPD. Pt c/o increased SOB since Winter 2020. He sometimes gets winded walking room to room at home. He is using albuterol inhaler and neb both 1-2 x per day.   Dyspnea:  [employer] walking / walk from [facility] space (sisters) / no steps but  there are times he can't get across the room s sob  Cough: better since quit  Sleep: flat bed/ 1 pillow  - no resp  SABA use:neb  once or twice daily "can't make it thru the day" and also using 2 different forms of hfa  02  Prn  rec Plan A = Automatic = Always=     Symbicort 160 Take 2 puffs first thing in am and then another 2 puffs about 12 hours later.  Work on inhaler technique: Plan B = Backup (to supplement plan A, not to replace it) Only use your albuterol inhaler as a rescue medication Plan C = Crisis (instead of Plan B but only if Plan B stops working) - only use your albuterol nebulizer if you first try Plan B and it fails to help  Make sure you check your oxygen saturations at highest level of activity  Please schedule a follow up visit in 3 months with pfts on return      05/31/2020  f/u ov/[place] office/Josselin Gaulin re: needs to go to lab / did stop smoking Chief Complaint  Patient presents with   Follow-up    Patient feels like breathing is the same since last visit. Cough but states that it is hard to get up mucus.   Dyspnea: basically housebound/ walks dog to porch / 25 ft bd Cough: non productive, last cig jun 2021 much better since then  Sleeping: flat bed/ no bipap/ wakes up feeling find SABA use: rarely neb,  Twice a daily proair never pre challenges or rechallenges  02: has it not using / not tracking sats with ex  Rec Plan A = Automatic = Always=    Symbicort 160 Take 2 puffs first thing in am and then another 2 puffs about 12 hours later.  Work on inhaler technique:     Plan B = Backup (to supplement plan A, not to replace it) Only use your albuterol inhaler as a rescue medication  Plan C = Crisis (instead of Plan B but only if Plan B stops working) - only use your albuterol nebulizer if you first try Plan B and it fails to help > ok to use the nebulizer up to every 4 hours but if start needing it regularly call for immediate appointment Make sure you check your oxygen saturations at highest level of activity to be sure it stays over 90%  Please remember to go to the lab and x-ray department at [facility]   for your tests - we will call you with the results when they are available. Please schedule a follow up visit in 3  months with pfts on return    Ad[facility] August 17 2020 [facility] > d/c home with trach and PEG and f/u by Dr Jenne Pane who rec laser surgery on trach     12/24/2020  f/u ov/ office/Syrai Gladwin re: GOLD IV post hosp  Chief Complaint  Patient presents with   Follow-up    Patient states that he would rather have Symbicort over [facility] inhaler feels like it does not work as good. Patient was admitted on 10/09/20. Sister states he will need surgical clearance today for his ENT Dr. Jenne Pane   Dyspnea:  mb and back 100 ft flat s 02  Cough: clear phlegm  Sleeping: bed is flat / on back one pillow SABA use: neb twice daily  02: 4lpm  Covid status: 2 vax      No obvious day to  day or daytime variability or assoc  purulent sputum or mucus plugs or hemoptysis or cp or chest tightness, subjective wheeze or overt sinus or hb symptoms.   Sleeping  without nocturnal  or early am exacerbation  of respiratory  c/o's or need for noct saba. Also denies any obvious fluctuation of symptoms with weather or environmental changes or other aggravating or alleviating factors except as outlined above   No unusual exposure hx or h/o childhood pna/ asthma or knowledge of premature birth.  Current Allergies, Complete Past Medical History, Past Surgical History, Family History, and Social History were reviewed in [employer] record.  ROS  The following are not active complaints unless bolded Hoarseness, sore throat, dysphagia, dental problems, itching, sneezing,  nasal congestion or discharge of excess mucus or purulent secretions, ear ache,   fever, chills, sweats, unintended wt loss or wt gain, classically pleuritic or exertional cp,  orthopnea pnd or arm/hand swelling  or leg swelling, presyncope, palpitations, abdominal pain, anorexia, nausea, vomiting, diarrhea  or change in bowel habits or change in bladder habits, change in stools or change in urine, dysuria, hematuria,  rash, arthralgias,  visual complaints, headache, numbness, weakness or ataxia or problems with walking or coordination,  change in mood or  memory.        Current Meds  Medication Sig   acetaminophen (TYLENOL) 325 MG tablet Take 2 tablets (650 mg total) by mouth every 6 (six) hours as needed for mild pain or moderate pain.   albuterol (PROAIR HFA) 108 (90 Base) MCG/ACT inhaler Inhale 1-2 puffs into the lungs every 4 (four) hours as needed for wheezing or shortness of breath.   albuterol (PROVENTIL) (2.5 MG/3ML) 0.083% nebulizer solution Take 3 mLs (2.5 mg total) by nebulization every 6 (six) hours as needed for wheezing or shortness of breath.   aspirin 81 MG chewable tablet Chew 1 tablet (81 mg total) by mouth daily.   bethanechol (URECHOLINE) 25 MG tablet TAKE TWO TABLETS BY MOUTH FOUR TIMES DAILY   buPROPion (WELLBUTRIN XL) 300 MG 24 hr tablet TAKE 1 TABLET BY MOUTH ONCE DAILY.   busPIRone (BUSPAR) 10 MG tablet TAKE ONE TABLET BY MOUTH 3 TIMES A DAY   clonazePAM (KLONOPIN) 0.5 MG tablet Take 1 tablet (0.5 mg total) by mouth at bedtime.   DULERA 200-5 MCG/ACT AERO INHALE (2) PUFFS INTO THE LUNGS TWICE DAILY   folic acid (FOLVITE) 1 MG tablet TAKE ONE TABLET BY MOUTH ONCE DAILY.   montelukast (SINGULAIR) 10 MG tablet TAKE ONE TABLET BY MOUTH AT BEDTIME   nitroGLYCERIN (NITROSTAT) 0.4 MG SL tablet Place 1 tablet (0.4 mg total) under the tongue every 5 (five) minutes as needed for chest pain.   oxyCODONE (OXY IR/ROXICODONE) 5 MG immediate release tablet Take 1 tablet (5 mg total) by mouth every 6 (six) hours as needed for moderate pain.   OXYGEN Inhale 2 L into the lungs as needed (shortness of breath).   pantoprazole (PROTONIX) 40 MG tablet Take 1 tablet (40 mg total) by mouth daily.   predniSONE (DELTASONE) 5 MG tablet Take 1 tablet (5 mg total) by mouth daily with breakfast.   QUEtiapine (SEROQUEL) 300 MG tablet Take 1/2 Tablet twice a day and one tablet at bedtime   scopolamine (TRANSDERM-SCOP) 1 MG/3DAYS Place  1 patch (1.5 mg total) onto the skin every 3 (three) days.   tamsulosin (FLOMAX) 0.4 MG CAPS capsule TAKE (1) CAPSULE BY MOUTH TWICE DAILY.   terazosin (HYTRIN) 2 MG capsule  TAKE ONE CAPSULE BY MOUTH ONCE DAILY                Past Medical History:  Diagnosis Date   Acute respiratory failure with hypoxia (HCC) 11/04/2019   Anxiety    Arthritis    COPD (chronic obstructive pulmonary disease) (HCC)    COPD with acute exacerbation (HCC) 11/04/2019   Coronary artery disease    Depression    Emphysema of lung (HCC)    Hypertension    Myocardial infarct (HCC)    Oxygen deficiency       Objective:     12/24/2020        181  05/31/20 194 lb 12.8 oz (88.4 kg)  03/06/20 186 lb (84.4 kg)  01/27/20 186 lb (84.4 kg)      Vital signs reviewed  12/24/2020  - Note at rest 02 sats  92% on RA   General appearance:    chornically ill amb wm wearing neck collar    HEENT : pt wearing mask not removed for exam due to covid -19 concerns./ trach in place    NECK :  without JVD/Nodes/TM/ nl carotid upstrokes bilaterally   LUNGS: no acc muscle use,  Mod barrel  contour chest wall with bilateral  Distant bs s audible wheeze and  without cough on insp or exp maneuvers and mod  Hyperresonant  to  percussion bilaterally     CV:  RRR  no s3 or murmur or increase in P2, and no edema   ABD:  soft and nontender with pos mid insp Hoover's  in the supine position. No bruits or organomegaly appreciated, bowel sounds nl/ PEG in place   MS:     ext warm without deformities, calf tenderness, cyanosis or clubbing No obvious joint restrictions   SKIN: warm and dry without lesions    NEURO:  alert, approp, nl sensorium with  no motor or cerebellar deficits apparent.         I personally reviewed images and agree with radiology impression as follows:  CXR:   portable 10/29/20  RIGHT basilar scarring. No acute abnormalities.      Assessment

## 2020-12-24 NOTE — Assessment & Plan Note (Addendum)
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  Total time for H and P, chart review, counseling, reviewing hfa device(s) and generating customized AVS unique to this office visit / same day charting = 45 min

## 2020-12-25 ENCOUNTER — Ambulatory Visit (INDEPENDENT_AMBULATORY_CARE_PROVIDER_SITE_OTHER): Payer: Medicaid Other | Admitting: Physician Assistant

## 2020-12-25 ENCOUNTER — Encounter: Payer: Self-pay | Admitting: Physician Assistant

## 2020-12-25 VITALS — BP 136/84 | HR 106 | Ht 68.0 in | Wt 180.0 lb

## 2020-12-25 DIAGNOSIS — I10 Essential (primary) hypertension: Secondary | ICD-10-CM

## 2020-12-25 DIAGNOSIS — R079 Chest pain, unspecified: Secondary | ICD-10-CM | POA: Diagnosis not present

## 2020-12-25 DIAGNOSIS — I2510 Atherosclerotic heart disease of native coronary artery without angina pectoris: Secondary | ICD-10-CM | POA: Diagnosis not present

## 2020-12-25 DIAGNOSIS — R0602 Shortness of breath: Secondary | ICD-10-CM | POA: Diagnosis not present

## 2020-12-25 LAB — ALPHA-1-ANTITRYPSIN PHENOTYP: A-1 Antitrypsin: 154 mg/dL (ref 101–187)

## 2020-12-25 MED ORDER — ROSUVASTATIN CALCIUM 5 MG PO TABS: 5.0000 mg | ORAL_TABLET | Freq: Every day | ORAL | 3 refills | Status: DC

## 2020-12-25 NOTE — Patient Instructions (Addendum)
Medication Instructions:    START Crestor 5 mg daily at dinner or bedtime  *If you need a refill on your cardiac medications before your next appointment, please call your pharmacy*   Lab Work:  FASTING Lipids in 3 months  If you have labs (blood work) drawn today and your tests are completely normal, you will receive your results only by: MyChart Message (if you have MyChart) OR A paper copy in the mail If you have any lab test that is abnormal or we need to change your treatment, we will call you to review the results.   Testing/Procedures: Your physician has requested that you have an echocardiogram. Echocardiography is a painless test that uses sound waves to create images of your heart. It provides your doctor with information about the size and shape of your heart and how well your heart's chambers and valves are working. This procedure takes approximately one hour. There are no restrictions for this procedure.     Your physician has requested that you have a lexiscan myoview. For further information please visit [email or web address] Please follow instruction sheet, as given.    Follow-Up: At [facility], you and your health needs are our priority.  As part of our continuing mission to provide you with exceptional heart care, we have created designated Provider Care Teams.  These Care Teams include your primary Cardiologist (physician) and Advanced Practice Providers (APPs -  Physician Assistants and Nurse Practitioners) who all work together to provide you with the care you need, when you need it.  We recommend signing up for the patient portal called "MyChart".  Sign up information is provided on this After Visit Summary.  MyChart is used to connect with patients for Virtual Visits (Telemedicine).  Patients are able to view lab/test results, encounter notes, upcoming appointments, etc.  Non-urgent messages can be sent to your provider as well.   To learn more about what you  can do with MyChart, go to [email or web address].    Your next appointment:   6 month(s)  The format for your next appointment:   In Person  Provider:   Nona Dell, MD   Other Instructions None

## 2020-12-26 ENCOUNTER — Ambulatory Visit: Payer: Medicaid Other | Admitting: Urology

## 2020-12-27 ENCOUNTER — Telehealth: Payer: Self-pay | Admitting: *Deleted

## 2020-12-27 ENCOUNTER — Encounter: Payer: Self-pay | Admitting: *Deleted

## 2020-12-27 ENCOUNTER — Other Ambulatory Visit: Payer: Self-pay | Admitting: Nurse Practitioner

## 2020-12-27 ENCOUNTER — Other Ambulatory Visit: Payer: Self-pay

## 2020-12-27 NOTE — Telephone Encounter (Signed)
He is one of yours. Want him to continue this?

## 2020-12-27 NOTE — Telephone Encounter (Signed)
Charles Weeks's sister called to request his pain medication be refilled.  She said she called earlier. (Looks like it was sent as a med refill)  He was last seen in the office by Riley Lam 11/15/20 and has an appt with Dr Riley Kill on 01/23/21.  Last refill was 11/20/20 per PMP

## 2020-12-27 NOTE — Telephone Encounter (Signed)
Last filled in PMP 11/20/20.

## 2020-12-28 MED ORDER — OXYCODONE HCL 5 MG PO TABS: 5.0000 mg | ORAL_TABLET | Freq: Four times a day (QID) | ORAL | 0 refills | Status: DC | PRN

## 2020-12-28 NOTE — Telephone Encounter (Signed)
Patient is out of his pain medication.   Lynden Ang can be reached on her cellphone [phone] or [phone].  As stated by you: She has been informed that you will call her back today.

## 2020-12-28 NOTE — Telephone Encounter (Signed)
Placed a call to [facility] to inquire on Mr. Charles Weeks fall. She reported to [facility] Mr. Charles Weeks had a fall, and he didn't follow up with his PCP.  [name] stated Mr. Charles Weeks had a fall a week ago, his feet became tangled into the dog leash and he fell on his knees, she denies Mr. Charles Weeks hit his head.  Ms. Charles Weeks also states her daughter, niece, neighbor and Mr. Charles Weeks daughter will be caring for him while she is out of the country for 10 days. She states she has a lock box and will only place his medication ( (oxycodone) for the 10 days she will be out of the country. She verbalizes understanding.

## 2021-01-11 ENCOUNTER — Telehealth: Payer: Self-pay

## 2021-01-11 ENCOUNTER — Other Ambulatory Visit: Payer: Self-pay | Admitting: Internal Medicine

## 2021-01-11 NOTE — Telephone Encounter (Signed)
bethanechol (URECHOLINE) 25 MG tablet   Patient needing a refill send to [facility] ph# [phone]

## 2021-01-11 NOTE — Telephone Encounter (Signed)
Rx refilled.

## 2021-01-14 ENCOUNTER — Other Ambulatory Visit: Payer: Self-pay | Admitting: Otolaryngology

## 2021-01-14 ENCOUNTER — Encounter (HOSPITAL_COMMUNITY): Payer: Medicaid Other

## 2021-01-14 ENCOUNTER — Other Ambulatory Visit: Payer: Self-pay

## 2021-01-14 ENCOUNTER — Other Ambulatory Visit (HOSPITAL_COMMUNITY): Payer: Medicaid Other

## 2021-01-14 ENCOUNTER — Encounter (HOSPITAL_COMMUNITY): Payer: Self-pay | Admitting: Otolaryngology

## 2021-01-14 NOTE — Progress Notes (Signed)
Spoke with pt's sister Dina Rich for pre-op call. DPR on file. She states pt has had a MI in the past. States no recent chest pain but states pt is normally short of breath and uses O2 at home all the time. Pt saw cardiology PA, Michelle lenze on 12/25/20. She states pt is not diabetic.   Will need Covid test done day of surgery. Chart sent to Anesthesia PA

## 2021-01-15 ENCOUNTER — Encounter (HOSPITAL_COMMUNITY): Payer: Self-pay | Admitting: Vascular Surgery

## 2021-01-15 ENCOUNTER — Telehealth: Payer: Self-pay | Admitting: Cardiology

## 2021-01-15 NOTE — Telephone Encounter (Signed)
I received a secure chat from the pre-anesthesia PA and Jacolyn Reedy, PA-C, today. Patient was scheduled for a microlaryngoscopy for laser excision of subglottic stenosis tomorrow. However, at last visit with Elon Jester stress test and Echo were ordered for further evaluation of symptoms similar to the one he had before prior PCI. Elon Jester does want both tests done before we can clear him for surgery. It looks to like both test are scheduled for 01/25/2021 at [facility]. Pre-op covering staff, can you please update patient and wife?  Thank you!

## 2021-01-15 NOTE — Telephone Encounter (Signed)
Patient's wife is following up regarding a procedure the patient is scheduled to have tomorrow, 01/15/21--states during appointment on 12/25/20 with Jacolyn Reedy, patient was advised he was cleared for his procedure, but today someone notified the patient that a stress test is needed for clearance. She is extremely upset. she states, "this is ridiculous.Marland Kitchenextremely stressful." Does anyone know anything about this patient's procedure? I don't see any notes or an order for a stress test.

## 2021-01-15 NOTE — Progress Notes (Signed)
Anesthesia Chart Review: SAME DAY WORK-UP  Case: [id number] Date/Time: 01/16/21 1015   Procedure: MICROLARYNGOSCOPY With Co2 laser excision of subglotic stenosis WITH Balloon DILATION   Anesthesia type: General   Pre-op diagnosis: subglotic stenosis   [facility]: [facility] ROOM 08 / [facility]   Surgeons: Christia Reading, MD       DISCUSSION: Patient is a 55 year old male scheduled for the above procedure.  History includes former smoker (quit 08/17/20), COPD (Gold IV), home oxygen (ARF/trach following fall 08/2020, using 2L with activity and at night as of 01/15/21), dyspnea, HTN, CAD (MI, s/p DES to RCA in [place], [place] 05/2018 ), BPH, bowel resection (2006), multi-trauma fall (08/17/20, see below).  Hospitalization 08/17/20-10/09/20 falling fall from a second story wall or stairs sustaining bilateral rib fractures, right pneumothorax, T8 vertebral body fracture, T7 neural arch fracture, T4-8 right transverse process fractures, right C7 transverse process fracture through foramen, and facial lacerations.  He was found down by his sister.  Oxygen saturation in the 70's, placed on oxygen. GCS 15, complaining of back pain and moving all extremities. S/p facial laceration repair and right CT for PTX in the ED (removed 09/16/20). Neurosurgery planned on 08/18/20 but he developed severe respiratory distress requiring intubation and pressor support. Decision made to treat fracture with brace for now. Weaned from pressors on 08/21/20, Transfused 1 unit PRBC 08/22/20. He failed vent wean and underwent tracheostomy and PEG placement on 09/03/20. S/p left chest tub on 09/04/20-09/11/20 for pleural effusion. 0%. Tolerating HTC 09/27/20 and trach downsized to 4 cuffless on 10/04/20 but back to 6 cuffless 10/05/20 for secretions. TF stopped on 10/04/20. Neurosurgery cleared c-collar removal 10/09/20. Required I&O cath/foley intermittently for urinary retention. Transferred to [facility] 10/09/20-10/31/20. Out-patient urology, general surgery, pulmonology  (incidental RLL opacity on CTA chest 10/30/20), PM&R follow-up planned. 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  Per 12/18/20 ENT note by Dr. Jenne Pane, patient has not been able to tolerate the PAssy-Muir valve and not making any significant voice. He notes there is "concern about having further spine surgery due to his lung function."  Microlaryngoscopy with possible dilation recommended.  He added that procedure "would be as much exploratory as potentially therapeutic and that I did not anticipate decannulated home soon, particularly if there are more surgeries planned." At that time patient had a 7.5 mm flexible cuffless Shiley in place, air trapping with Passy-Muir valve in place and not able to make voice.  Last visit with pulmonologist Dr. Sherene Sires 12/24/20. He wrote, patient's chronic respiratory failure with hypoxia was "Well compensated, no contraindication to   airway surgery at or above the level of the trach as the area of concern is adequately bypassed and lung function though poor has stabilized off mech vent".  Last cardiology visit 12/25/20 with Jacolyn Reedy, PA-C. He reported chest tightness relieved with Nitro every few months, mostly with activity. Occurs infrequently, but similarity to before he had stents. She ordered a [employer]. With chronic SOB and scheduled for ENT surgery on 01/16/21. He had stopped smoking. Echo also ordered. The stress test and echo have not yet been done--they were scheduled for 01/14/21 but patient cancelled. I spoke with patient's sister Dina Rich. Mr. Inabinet said last use of Nitro was two months ago. Breathing is stable since visit with Dr. Jenne Pane. No SOB at rest. Uses O2 at 2L with activity and at night. He has follow-up with neurosurgeon 01/15/21, but  currently still unclear if he will require future back surgery. Ms. [name] indicated that to her  understanding, his ENT surgery was not dependent on these tests being done. No new issues since then. I have reached out to Jacolyn Reedy to clarify who indicates test were ment to be done preoperatively given chest pain similar to when he had stents placed, so cardiology could not provide preoperative input. I spoke with Misty Stanley at Dr. Jenne Pane' office regarding cardiology unable to clear until tests have been completed and also inquiring about urgency of procedure. She reviewed with Dr. Jenne Pane' with current plan to postpone surgery until after he has cardiac testing and clearance.    VS:  BP Readings from Last 3 Encounters:  12/25/20 136/84  12/24/20 122/88  11/15/20 133/89   Pulse Readings from Last 3 Encounters:  12/25/20 (!) 106  12/24/20 95  11/15/20 92     PROVIDERS: Anabel Halon, MD is PCP  Sandrea Hughs, MD is pulmonologist Nona Dell, MD is cardiologist ([facility]) Marikay Alar, MD is neurosurgeon   LABS: For day of surgery as indicated. Currently, last results include: Lab Results  Component Value Date   WBC 9.7 10/19/2020   HGB 12.0 (L) 10/19/2020   HCT 39.2 10/19/2020   PLT 345 10/19/2020   GLUCOSE 119 (H) 10/10/2020   ALT 27 10/10/2020   AST 20 10/10/2020   NA 139 10/10/2020   K 3.9 10/10/2020   CL 100 10/10/2020   CREATININE 0.85 10/10/2020   BUN 17 10/10/2020   CO2 30 10/10/2020   INR 1.2 08/17/2020   HGBA1C 6.2 (H) 08/19/2020    PFTs 05/01/20: FVC 2.40 (52%), post 3.24 (70%). FEV1 0.95 (27%), 0.97 (27%), DLCO unc 11.59 (43%). Per Dr. Sherene Sires, "Severe copd but there is reversible component and may benefit from a stronger inhaler but would need ov 1st for sample and teaching".   IMAGES: CT T-spine 12/10/20: IMPRESSION: 1. T8 burst fracture with severe anterior height loss, progressed from 10/08/2020. 2. T9 fracture with mild progression of mild left-sided height loss. 3. Unchanged minimal compression fractures at T7, T10, T11, and  T12. 4. Multiple spinous process, transverse process, and rib fractures with varying degrees of healing as detailed above.   CTA Chest 10/30/20: IMPRESSION: 1. No appreciable pulmonary embolus. No thoracic aortic aneurysm or dissection. 2. Nodular opacity apical segment right lower lobe measuring 1.2 x 0.8 x 0.8 cm. Consider one of the following in 3 months for both low-risk and high-risk individuals: (a) repeat chest CT, (b) follow-up PET-CT, or (c) tissue sampling. This recommendation follows the consensus statement: Guidelines for Management of Incidental Pulmonary Nodules Detected on CT Images: From the Fleischner Society 2017; Radiology 2017; 284:228-243. 3. No edema or airspace opacity. Underlying emphysematous change. Tracheostomy present. No pneumothorax. 4.  No evident adenopathy. 5. Fractures of the T8 and T9 vertebral body with localized kyphosis in this area. End plate irregularity noted in this area. Note that if there is any clinical suspicion for potential discitis in this area, MR pre and post-contrast would be the optimum study of choice to further evaluate. - Emphysema (ICD10-J43.9).  1V PCXR 10/29/20: IMPRESSION: RIGHT basilar scarring. No acute abnormalities.   CT C-spine 09/11/20: IMPRESSION: - Image quality degraded by extensive motion. Multiple repeat images were attempted but also are degraded by motion. - No fracture identified. Repeat study recommended if there is high clinical suspicion or continued pain. - Cervical spondylosis.    EKG: 10/30/20: Normal sinus rhythm Nonspecific ST  abnormality Abnormal ECG Compared to previous tracing , rate is slower, PVCs absent Confirmed by Patwardhan, Manish ([id number]) on 10/30/2020 5:30:59 PM   CV: Echo 08/18/20 (in setting of mult-trauma): IMPRESSIONS   1. The pericardial effusion is circumferential and trivial; other  measurements in the study include the right ventricle in the setting of RV  enlargement and  pericardial fat pad.   2. The inferior vena cava is normal in size with <50% respiratory  variability, suggesting right atrial pressure of 8 mmHg.   3. Left ventricular ejection fraction, by estimation, is 55 to 60%. The  left ventricle has normal function. The left ventricle has no regional  wall motion abnormalities. There is mild concentric left ventricular  hypertrophy. Left ventricular diastolic  function could not be evaluated.   4. Right ventricular systolic function is normal. The right ventricular  size is moderately enlarged.   5. The mitral valve is grossly normal. No evidence of mitral valve  regurgitation.   6. The aortic valve was not well visualized. Aortic valve regurgitation  is not visualized.  - Comparison(s): A prior study was performed on 01/31/20. Prior images  reviewed side by side. From other MRN: right ventrice appears larger from  prior. A pericardial fat pad was noted in this study and in the prior  study as well. Study is not consistent  with increased pericardial pressures. Technically difficult study.    Past Medical History:  Diagnosis Date   Anxiety    Anxiety 01/19/2020   Arthritis    Benign prostatic hyperplasia with weak urinary stream 12/08/2019   BPH (benign prostatic hyperplasia) 08/17/2020   CAD (coronary artery disease) 08/17/2020   Chronic respiratory failure with hypoxia (HCC) 01/24/2020   Has home 02 but not using as of 01/24/2020  -  01/24/2020   Walked RA  approx   200 ft  @ slow pace  stopped due to  Dizzy with sats still 95%      Cigarette smoker 01/24/2020   Stopped regular smoking 10/2019 but still "now and then" as of 01/24/2020    COPD (chronic obstructive pulmonary disease) (HCC)    COPD (chronic obstructive pulmonary disease) (HCC) 08/17/2020   gold   COPD GOLD ?     Quit smoking 10/2019  - Labs ordered 01/24/2020  :  allergy profile   alpha one AT phenotype   - 01/24/2020  After extensive coaching inhaler device,  effectiveness =    75%  (short Ti) > change symb 80 to 160 2bid      Coronary artery disease 06/08/2018   DES to RCA, [facility] [place], [place])   Depression    Depression, major, single episode, moderate (HCC) 01/19/2020   Dyspnea    Encounter for screening for malignant neoplasm of colon 12/08/2019   Essential hypertension    Essential hypertension 12/08/2019   History of MI (myocardial infarction) 01/19/2020   HTN (hypertension) 08/17/2020   Myocardial infarct (HCC) 2019   Myocardial infarct (HCC)    Pneumonia    Respiratory failure with hypoxia [facility])     Past Surgical History:  Procedure Laterality Date   BOWEL RESECTION  2006   PEG PLACEMENT N/A 09/03/2020   Procedure: PERCUTANEOUS ENDOSCOPIC GASTROSTOMY (PEG) PLACEMENT;  Surgeon: Violeta Gelinas, MD;  Location: [facility] OR;  Service: General;  Laterality: N/A;   PERCUTANEOUS TRACHEOSTOMY N/A 09/03/2020   Procedure: PERCUTANEOUS TRACHEOSTOMY USING 6 SHILEY;  Surgeon: Violeta Gelinas, MD;  Location: [facility] OR;  Service: General;  Laterality: N/A;  SKIN GRAFT     TONSILLECTOMY AND ADENOIDECTOMY      MEDICATIONS: No current facility-administered medications for this encounter.    acetaminophen (TYLENOL) 325 MG tablet   albuterol (PROAIR HFA) 108 (90 Base) MCG/ACT inhaler   albuterol (PROVENTIL) (2.5 MG/3ML) 0.083% nebulizer solution   budesonide-formoterol (SYMBICORT) 160-4.5 MCG/ACT inhaler   buPROPion (WELLBUTRIN XL) 300 MG 24 hr tablet   busPIRone (BUSPAR) 10 MG tablet   Calcium Carb-Cholecalciferol (CALCIUM 600/VITAMIN D) 600-400 MG-UNIT CHEW   Cholecalciferol (VITAMIN D3) 125 MCG (5000 UT) CAPS   clonazePAM (KLONOPIN) 0.5 MG tablet   docusate sodium (COLACE) 100 MG capsule   folic acid (FOLVITE) 1 MG tablet   montelukast (SINGULAIR) 10 MG tablet   nitroGLYCERIN (NITROSTAT) 0.4 MG SL tablet   oxyCODONE (OXY IR/ROXICODONE) 5 MG immediate release tablet   OXYGEN   pantoprazole (PROTONIX) 40 MG tablet   predniSONE (DELTASONE) 5 MG tablet    QUEtiapine (SEROQUEL) 300 MG tablet   rosuvastatin (CRESTOR) 5 MG tablet   scopolamine (TRANSDERM-SCOP) 1 MG/3DAYS   tamsulosin (FLOMAX) 0.4 MG CAPS capsule   terazosin (HYTRIN) 2 MG capsule   aspirin 81 MG chewable tablet   bethanechol (URECHOLINE) 25 MG tablet    Shonna Chock, PA-C Surgical Short Stay/Anesthesiology [facility] Phone [phone] [facility] Phone [phone] 01/15/2021 3:06 PM

## 2021-01-15 NOTE — Telephone Encounter (Signed)
Personally spoke with patient's sister who is health care power of attorney. She states that at last visit with Elon Jester she was told that upcoming surgery was "not contingent" on the results from the stress test and Echo. I personally had a discussion with Elon Jester this morning who stated that both tests were needed prior to surgery. I apologized for any miscommunication or confusion. She is still very frustrated that surgery tomorrow has been cancelled but wants to do what is best for her brother. Currently Echo and stress test are scheduled for 01/25/2021 at [facility]. I told patient's sister that I would try to see if we could get these tests moved up so that we can get his surgery done as soon as possible. I will route this note back to the callback pool as well as the [facility] triage/scheduling pools to see if we can get this done.  Corrin Parker, PA-C 01/15/2021 4:41 PM

## 2021-01-15 NOTE — Telephone Encounter (Signed)
Staff message to [facility] triage and scheduling to see if they may be able to move appt testing sooner for the pt.

## 2021-01-16 ENCOUNTER — Other Ambulatory Visit: Payer: Self-pay | Admitting: Neurological Surgery

## 2021-01-16 ENCOUNTER — Ambulatory Visit (HOSPITAL_COMMUNITY): Admission: RE | Admit: 2021-01-16 | Payer: Medicaid Other | Source: Home / Self Care | Admitting: Otolaryngology

## 2021-01-16 DIAGNOSIS — S22068S Other fracture of T7-T8 thoracic vertebra, sequela: Secondary | ICD-10-CM

## 2021-01-16 HISTORY — DX: Dyspnea, unspecified: R06.00

## 2021-01-16 HISTORY — DX: Pneumonia, unspecified organism: J18.9

## 2021-01-16 SURGERY — MICROLARYNGOSCOPY WITH DILATION
Anesthesia: General

## 2021-01-16 NOTE — Telephone Encounter (Signed)
Will forward to pre op  

## 2021-01-16 NOTE — Telephone Encounter (Signed)
After inquiring in [facility] it was determined that [facility] have no available slots open and the first available opening for [facility] is 02/11/21.

## 2021-01-17 ENCOUNTER — Ambulatory Visit (HOSPITAL_COMMUNITY): Payer: Medicaid Other | Admitting: Physical Therapy

## 2021-01-17 ENCOUNTER — Ambulatory Visit (HOSPITAL_COMMUNITY): Payer: Medicaid Other

## 2021-01-17 NOTE — Telephone Encounter (Signed)
Spoke with pt's sister, [name], [place], and she has been made aware that there is no sooner availability for pt's stress test & echo sooner than what he has.  She said that the 01/25/21 appointment is fine.  Will route to the requesting surgeon's office to make them aware.

## 2021-01-17 NOTE — Telephone Encounter (Signed)
Pre-op covering staff, can you please just update patient/sister that unfortunately there is no availability to move stress test and Echo up. He is currently scheduled for these tests on 01/25/2021 and that is the earliest time available.  Thank you!

## 2021-01-21 ENCOUNTER — Other Ambulatory Visit (HOSPITAL_COMMUNITY): Payer: Medicaid Other

## 2021-01-21 ENCOUNTER — Other Ambulatory Visit: Payer: Self-pay | Admitting: Internal Medicine

## 2021-01-21 ENCOUNTER — Encounter (HOSPITAL_COMMUNITY): Payer: Medicaid Other

## 2021-01-22 ENCOUNTER — Ambulatory Visit (HOSPITAL_COMMUNITY): Payer: Medicaid Other | Attending: Physical Medicine & Rehabilitation | Admitting: Speech Pathology

## 2021-01-22 ENCOUNTER — Encounter (HOSPITAL_COMMUNITY): Payer: Self-pay | Admitting: Speech Pathology

## 2021-01-22 ENCOUNTER — Other Ambulatory Visit: Payer: Self-pay

## 2021-01-22 DIAGNOSIS — R490 Dysphonia: Secondary | ICD-10-CM | POA: Diagnosis present

## 2021-01-23 ENCOUNTER — Encounter: Payer: Medicaid Other | Attending: Registered Nurse | Admitting: Physical Medicine & Rehabilitation

## 2021-01-23 ENCOUNTER — Encounter: Payer: Self-pay | Admitting: Physical Medicine & Rehabilitation

## 2021-01-23 VITALS — BP 148/89 | HR 92 | Temp 99.0°F | Ht 68.0 in | Wt 185.0 lb

## 2021-01-23 DIAGNOSIS — Z79891 Long term (current) use of opiate analgesic: Secondary | ICD-10-CM | POA: Insufficient documentation

## 2021-01-23 DIAGNOSIS — J386 Stenosis of larynx: Secondary | ICD-10-CM | POA: Diagnosis present

## 2021-01-23 DIAGNOSIS — S22000G Wedge compression fracture of unspecified thoracic vertebra, subsequent encounter for fracture with delayed healing: Secondary | ICD-10-CM | POA: Insufficient documentation

## 2021-01-23 DIAGNOSIS — R338 Other retention of urine: Secondary | ICD-10-CM | POA: Diagnosis present

## 2021-01-23 DIAGNOSIS — N401 Enlarged prostate with lower urinary tract symptoms: Secondary | ICD-10-CM | POA: Diagnosis present

## 2021-01-23 DIAGNOSIS — Z5181 Encounter for therapeutic drug level monitoring: Secondary | ICD-10-CM | POA: Insufficient documentation

## 2021-01-23 DIAGNOSIS — G894 Chronic pain syndrome: Secondary | ICD-10-CM | POA: Diagnosis present

## 2021-01-23 MED ORDER — OXYCODONE HCL 5 MG PO TABS: 5.0000 mg | ORAL_TABLET | Freq: Four times a day (QID) | ORAL | 0 refills | Status: DC | PRN

## 2021-01-23 NOTE — Patient Instructions (Addendum)
TYLENOL: MAY TAKE 2000MG  DAILY REGULARLY AND UP TO 3000MG  IF NEEDED.   PLEASE FEEL FREE TO CALL OUR OFFICE WITH ANY PROBLEMS OR QUESTIONS [phone])

## 2021-01-23 NOTE — Progress Notes (Signed)
Subjective:    Patient ID: Charles Weeks, male    DOB: 1966/01/18, 55 y.o.   MRN: [id number]  HPI This is a follow-up office visit for Mr. Nakeem Murnane who suffered a fall in April for suffering a 3 column T8 vertebral body fracture with T7 neural arch fracture.  Course was complicated by multiple medical issues.  He was on rehab for an extended stay.  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  He was followed up by pulmonary critical care as an outpatient.  Urine retention was also an issue.  Apparently he suffered a fall about a month ago when his feet became tangled within a dog leash and he hit his head.  He saw Dr. Yetta Barre re: his thoracic fx's and the fx'es have progressed. Future imaging was recommended as well as consvt care.  Dr. Yetta Barre wants to avoid surgical intervention given his comorbidities.  He continues with his TLSO  He has an upcoming stress test and potentially trachial dilation and scar tissue lysis as well.  He had his first speech therapy session this week.   His pain is mostly in his back, neck and right shoulder. It can affect his sleep.  He is using oxycodone up to 4 times daily with ibuprofen 800 mg twice daily in between as well as Tylenol 500 mg twice daily.  He is frustrated by his ongoing pulmonary issues.  He has not made any progress from the standpoint of his phonation.  He wears the number for trach currently.  His sister has some medical background and has been assisting with his trach care and other medical considerations.   Pain Inventory Average Pain 9 Pain Right Now 9 My pain is sharp, burning, dull, stabbing, and aching  In the last 24 hours, has pain interfered with the following? General activity 10 Relation with others 10 Enjoyment of life 10 What TIME of day is your pain at its worst? morning , daytime, evening, and night Sleep (in general) Fair  Pain is worse with: walking, bending, sitting, standing,  and some activites Pain improves with: medication Relief from Meds: 6  Family History  Problem Relation Age of Onset   Heart disease Mother    Heart disease Father    Diabetes Sister    Social History   Socioeconomic History   Marital status: Single    Spouse name: Not on file   Number of children: 2   Years of education: Not on file   Highest education level: 7th grade  Occupational History   Not on file  Tobacco Use   Smoking status: Former    Packs/day: 1.50    Types: Cigarettes    Quit date: 08/17/2020    Years since quitting: 0.4   Smokeless tobacco: Never  Vaping Use   Vaping Use: Never used  Substance and Sexual Activity   Alcohol use: Not Currently   Drug use: Not Currently   Sexual activity: Not on file  Other Topics Concern   Not on file  Social History Narrative   ** Merged History Encounter **       Lives with sister  Both daughters live close by   Sister has dog   Enjoys: shooting pool, fishing, Therapist, nutritional  Diet: eats all food groups Caffeine: pop-dr pepper 2 liter  Water: 4-6 cups   Wears seat belt  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 No weapons  Social Determinants of Health   Financial Resource Strain: Not on file  Food Insecurity: Not on file  Transportation Needs: Not on file  Physical Activity: Not on file  Stress: Not on file  Social Connections: Not on file   Past Surgical History:  Procedure Laterality Date   BOWEL RESECTION  2006   PEG PLACEMENT N/A 09/03/2020   Procedure: PERCUTANEOUS ENDOSCOPIC GASTROSTOMY (PEG) PLACEMENT;  Surgeon: Violeta Gelinas, MD;  Location[facility] OR;  Service: General;  Laterality: N/A;   PERCUTANEOUS TRACHEOSTOMY N/A 09/03/2020   Procedure: PERCUTANEOUS TRACHEOSTOMY USING 6 SHILEY;  Surgeon: Violeta Gelinas, MD;  Location: [facility] OR;  Service: General;  Laterality: N/A;   SKIN GRAFT     TONSILLECTOMY AND ADENOIDECTOMY     Past Surgical History:  Procedure Laterality Date    BOWEL RESECTION  2006   PEG PLACEMENT N/A 09/03/2020   Procedure: PERCUTANEOUS ENDOSCOPIC GASTROSTOMY (PEG) PLACEMENT;  Surgeon: Violeta Gelinas, MD;  Location: [facility] OR;  Service: General;  Laterality: N/A;   PERCUTANEOUS TRACHEOSTOMY N/A 09/03/2020   Procedure: PERCUTANEOUS TRACHEOSTOMY USING 6 SHILEY;  Surgeon: Violeta Gelinas, MD;  Location[facility] OR;  Service: General;  Laterality: N/A;   SKIN GRAFT     TONSILLECTOMY AND ADENOIDECTOMY     Past Medical History:  Diagnosis Date   Anxiety    Anxiety 01/19/2020   Arthritis    Benign prostatic hyperplasia with weak urinary stream 12/08/2019   BPH (benign prostatic hyperplasia) 08/17/2020   CAD (coronary artery disease) 08/17/2020   Chronic respiratory failure with hypoxia (HCC) 01/24/2020   Has home 02 but not using as of 01/24/2020  -  01/24/2020   Walked RA  approx   200 ft  @ slow pace  stopped due to  Dizzy with sats still 95%      Cigarette smoker 01/24/2020   Stopped regular smoking 10/2019 but still "now and then" as of 01/24/2020    COPD (chronic obstructive pulmonary disease) (HCC)    COPD (chronic obstructive pulmonary disease) (HCC) 08/17/2020   gold   COPD GOLD ?     Quit smoking 10/2019  - Labs ordered 01/24/2020  :  allergy profile   alpha one AT phenotype   - 01/24/2020  After extensive coaching inhaler device,  effectiveness =    75% (short Ti) > change symb 80 to 160 2bid      Coronary artery disease 06/08/2018   DES to RCA, [facility] [place])   Depression    Depression, major, single episode, moderate (HCC) 01/19/2020   Dyspnea    Encounter for screening for malignant neoplasm of colon 12/08/2019   Essential hypertension    Essential hypertension 12/08/2019   History of MI (myocardial infarction) 01/19/2020   HTN (hypertension) 08/17/2020   Myocardial infarct (HCC) 2019   Myocardial infarct (HCC)    Pneumonia    Respiratory failure with hypoxia (HCC)    BP (!) 148/89   Pulse 92   Temp 99 F (37.2 C)   Ht  5\' 8"  (1.727 m)   Wt 185 lb (83.9 kg)   SpO2 91%   BMI 28.13 kg/m   Opioid Risk Score:   Fall Risk Score:  `1  Depression screen PHQ 2/9  Depression screen [facility] 2/9 12/14/2020 11/15/2020 11/05/2020 04/16/2020 03/06/2020 01/19/2020 12/08/2019  Decreased Interest 0 1 0 2 1 2  0  Down, Depressed, Hopeless 0 2 3 2 1 3 3   PHQ - 2 Score 0 3 3 4 2 5  3  Altered sleeping 0 0 0 2 0 2 0  Tired, decreased energy 0 3 3 2 3 2 3   Change in appetite 0 0 0 1 0 1 1  Feeling bad or failure about yourself  0 0 0 0 1 1 1   Trouble concentrating 0 0 0 0 1 2 1   Moving slowly or fidgety/restless 0 0 0 0 1 2 1   Suicidal thoughts 0 0 3 0 0 0 0  PHQ-9 Score 0 6 9 9 8 15 10   Difficult doing work/chores Not difficult at all - Very difficult - Not difficult at all Very difficult Somewhat difficult    Review of Systems  Constitutional: Negative.   HENT: Negative.    Eyes: Negative.   Respiratory: Negative.    Cardiovascular: Negative.   Gastrointestinal: Negative.   Endocrine: Negative.   Genitourinary: Negative.   Musculoskeletal:  Positive for arthralgias, back pain and neck pain.  Allergic/Immunologic: Negative.   Neurological: Negative.   Hematological: Negative.   Psychiatric/Behavioral: Negative.    All other systems reviewed and are negative.     Objective:   Physical Exam Gen: no distress, normal appearing HEENT: oral mucosa pink and moist, NCAT, #4 trach.  Patient has very coarse breath sounds and is able to whisper but only phonates to a minimal degree.  Does not appear to be in distress Cardio: Reg rate Chest: normal effort, normal rate of breathing Abd: soft, non-distended Ext: no edema Psych: pleasant, normal affect Skin: intact Neuro: Neurologically appears to be alert and oriented x3.  Poor phonation as noted above.  Strength is grossly 4+ to 5 out of 5 in all 4 limbs with no focal sensory findings.  Balance was fair to good.  He is able to transfer easily. Musculoskeletal: TLSO in place  appears to be fitting appropriately.  No pain with palpation of his back and chest.  Right shoulder was ranged passively with minimal discomfort.  There was some pain with end range internal rotation and flexion.       Assessment & Plan:  1.  Debility secondary to fall 08/17/20 with 3 column T8 vertebral body fracture/T7 neural arch fracture/right TVP T4-8/right C7 transverse process fracture through the foramen.                -continue brace per neurosurgery  -maintain HEP as possible 2.  . Pain Management: Continue Oxycodone as needed.  Refilled today  -May take up to 2000 mg of Tylenol on a daily basis and 3000 occasionally.  -Okay for now to use ibuprofen 800 mg twice daily as needed with food             -mild radiculopathy?. Seems improved  -We will initiate a controlled substance monitoring program, this consists of regular clinic visits, examinations, routine drug screening, pill counts as well as use of [place] Controlled Substance Reporting System. [place] was reviewed today.    -UDS was ordered today               4. Mood: Continue Wellbutrin 300 mg daily, BuSpar 10 mg 3 times daily, Klonopin 0.5 mg nightly,              - Seroquel 150 mg twice daily and 300 mg nightly                  5.  CAD/MI/DES.  Continue aspirin per cardiology.  Upcoming stress test 6.  Acute hypercarbic ventilatory dependent respiratory failure/COPD.  Tracheostomy 09/03/2020  per Dr. Janee Morn.   -ENT and pulmonology following.  Potential dilatation and lysis of scar tissue/adhesions upcoming                        -  7.  BPH/urine retention:              -oob to void for all attempts              -Continue hyrtin, Flomax and Urecholine.  Occasionally needs to catheterize but he is usually able to empty with extra time. 8 Constipation---              Encouraged addition of scheduled stool softener such as Colace  Thirty minutes of face to face patient care time were spent during this visit. All  questions were encouraged and answered. Follow up with me in 2 months.

## 2021-01-24 ENCOUNTER — Other Ambulatory Visit: Payer: Self-pay | Admitting: Internal Medicine

## 2021-01-24 ENCOUNTER — Other Ambulatory Visit: Payer: Self-pay | Admitting: Physical Medicine & Rehabilitation

## 2021-01-25 ENCOUNTER — Ambulatory Visit (HOSPITAL_COMMUNITY)
Admission: RE | Admit: 2021-01-25 | Discharge: 2021-01-25 | Disposition: A | Discharge status: Home / Self Care | Payer: Medicaid Other | Source: Ambulatory Visit | Attending: Physician Assistant | Admitting: Physician Assistant

## 2021-01-25 ENCOUNTER — Encounter (HOSPITAL_BASED_OUTPATIENT_CLINIC_OR_DEPARTMENT_OTHER)
Admission: RE | Admit: 2021-01-25 | Discharge: 2021-01-25 | Disposition: A | Discharge status: Home / Self Care | Payer: Medicaid Other | Source: Ambulatory Visit | Attending: Physician Assistant | Admitting: Physician Assistant

## 2021-01-25 ENCOUNTER — Other Ambulatory Visit: Payer: Self-pay

## 2021-01-25 ENCOUNTER — Encounter (HOSPITAL_COMMUNITY): Payer: Self-pay

## 2021-01-25 DIAGNOSIS — R079 Chest pain, unspecified: Secondary | ICD-10-CM

## 2021-01-25 DIAGNOSIS — R0602 Shortness of breath: Secondary | ICD-10-CM | POA: Diagnosis present

## 2021-01-25 HISTORY — DX: Unspecified asthma, uncomplicated: J45.909

## 2021-01-25 LAB — ECHOCARDIOGRAM COMPLETE
AR max vel: 1.93 cm2
AV Area VTI: 2.26 cm2
AV Area mean vel: 2 cm2
AV Mean grad: 3.1 mmHg
AV Peak grad: 6.5 mmHg
Ao pk vel: 1.27 m/s
Area-P 1/2: 3.48 cm2
Calc EF: 50.5 %
MV VTI: 2.51 cm2
S' Lateral: 3.27 cm
Single Plane A2C EF: 52.8 %
Single Plane A4C EF: 46 %

## 2021-01-25 LAB — NM MYOCAR MULTI W/SPECT W/WALL MOTION / EF
LV dias vol: 81 mL (ref 62–150)
LV sys vol: 40 mL
Nuc Stress EF: 50 %
Peak HR: 108 {beats}/min
RATE: 0.4
Rest HR: 76 {beats}/min
Rest Nuclear Isotope Dose: 10.7 mCi
SDS: 3
SRS: 11
SSS: 14
ST Depression (mm): 0 mm
Stress Nuclear Isotope Dose: 30 mCi
TID: 1.06

## 2021-01-25 MED ORDER — TECHNETIUM TC 99M TETROFOSMIN IV KIT
10.0000 | PACK | Freq: Once | INTRAVENOUS | Status: AC | PRN
  Administered 2021-01-25: 10.7 via INTRAVENOUS

## 2021-01-25 MED ORDER — REGADENOSON 0.4 MG/5ML IV SOLN
INTRAVENOUS | Status: AC
  Administered 2021-01-25: 0.4 mg via INTRAVENOUS
  Filled 2021-01-25: qty 5

## 2021-01-25 MED ORDER — TECHNETIUM TC 99M TETROFOSMIN IV KIT
30.0000 | PACK | Freq: Once | INTRAVENOUS | Status: AC | PRN
  Administered 2021-01-25: 30 via INTRAVENOUS

## 2021-01-25 MED ORDER — SODIUM CHLORIDE FLUSH 0.9 % IV SOLN
INTRAVENOUS | Status: AC
  Administered 2021-01-25: 10 mL via INTRAVENOUS
  Filled 2021-01-25: qty 10

## 2021-01-28 ENCOUNTER — Encounter (HOSPITAL_COMMUNITY): Payer: Self-pay | Admitting: Speech Pathology

## 2021-01-28 LAB — TOXASSURE SELECT,+ANTIDEPR,UR

## 2021-01-28 NOTE — Telephone Encounter (Signed)
   Name: Charles Weeks  DOB: 1966-03-28  MRN: [id number]   Primary Cardiologist: Nona Dell, MD  Chart reviewed as part of pre-operative protocol coverage. Patient was contacted 01/28/2021 in reference to pre-operative risk assessment for pending surgery as outlined below.  Charles Weeks was last seen on 12/25/20 by Jacolyn Reedy PAC. Due to symptoms, pt underwent nuclear stress test that was nonischemic an echocardiogram that showed mild diastolic dysfunction, normal LVEF and no valvular disease.   Therefore, based on ACC/AHA guidelines, the patient would be at acceptable risk for the planned procedure without further cardiovascular testing.   The patient was advised that if he develops new symptoms prior to surgery to contact our office to arrange for a follow-up visit, and he verbalized understanding.  I will route this recommendation to the requesting party via Epic fax function and remove from pre-op pool. Please call with questions.  Roe Rutherford Janeil Schexnayder, PA 01/28/2021, 8:47 AM

## 2021-01-28 NOTE — Telephone Encounter (Signed)
Call placed to pt's sister, [name], [place] on file.  She has been made aware that pt has been cleared for surgery.  She has let me know that Dr. Jenne Pane, ENT, is doing pt's procedure.  Placed call to his office, left a detailed message for surgery scheduler, [name], and will fax the clearance over to their office.

## 2021-01-28 NOTE — Therapy (Signed)
[facility] [address] [place], [place], [address] Phone: [phone]   Fax:  [phone]  Speech Language Pathology Evaluation  Patient Details  Name: Charles Weeks MRN: [id number] Date of Birth: Dec 07, 1965 Referring Provider (SLP): Claudette Laws   Encounter Date: 01/22/2021    Past Medical History:  Diagnosis Date   Anxiety    Anxiety 01/19/2020   Arthritis    Asthma    Benign prostatic hyperplasia with weak urinary stream 12/08/2019   BPH (benign prostatic hyperplasia) 08/17/2020   CAD (coronary artery disease) 08/17/2020   Chronic respiratory failure with hypoxia (HCC) 01/24/2020   Has home 02 but not using as of 01/24/2020  -  01/24/2020   Walked RA  approx   200 ft  @ slow pace  stopped due to  Dizzy with sats still 95%      Cigarette smoker 01/24/2020   Stopped regular smoking 10/2019 but still "now and then" as of 01/24/2020    COPD (chronic obstructive pulmonary disease) (HCC)    COPD (chronic obstructive pulmonary disease) (HCC) 08/17/2020   gold   COPD GOLD ?     Quit smoking 10/2019  - Labs ordered 01/24/2020  :  allergy profile   alpha one AT phenotype   - 01/24/2020  After extensive coaching inhaler device,  effectiveness =    75% (short Ti) > change symb 80 to 160 2bid      Coronary artery disease 06/08/2018   DES to RCA, [facility] [place], [place])   Depression    Depression, major, single episode, moderate (HCC) 01/19/2020   Dyspnea    Encounter for screening for malignant neoplasm of colon 12/08/2019   Essential hypertension    Essential hypertension 12/08/2019   History of MI (myocardial infarction) 01/19/2020   HTN (hypertension) 08/17/2020   Myocardial infarct (HCC) 2019   Myocardial infarct (HCC)    Pneumonia    Respiratory failure with hypoxia [facility])     Past Surgical History:  Procedure Laterality Date   BOWEL RESECTION  2006   PEG PLACEMENT N/A 09/03/2020   Procedure: PERCUTANEOUS ENDOSCOPIC  GASTROSTOMY (PEG) PLACEMENT;  Surgeon: Violeta Gelinas, MD;  Location: [facility] OR;  Service: General;  Laterality: N/A;   PERCUTANEOUS TRACHEOSTOMY N/A 09/03/2020   Procedure: PERCUTANEOUS TRACHEOSTOMY USING 6 SHILEY;  Surgeon: Violeta Gelinas, MD;  Location: [facility] OR;  Service: General;  Laterality: N/A;   SKIN GRAFT     TONSILLECTOMY AND ADENOIDECTOMY      There were no vitals filed for this visit.    01/22/21 1557  SLP Visit Information  SLP Received On 01/22/21  Referring Provider (SLP) Claudette Laws  Onset Date 08/17/2020  Medical Diagnosis dysphonia  Subjective  Subjective "I can't talk"  Patient/Family Stated Goal Improve voice  General Information  HPI Charles Weeks is a 55 year old male referred for SLP evaluation by Dr. Faith Rogue for dysphonia. Past medical history includes former smoker (quit 08/17/20), COPD (Gold IV), home oxygen (ARF/trach following fall 08/2020, using 2L with activity and at night as of 01/15/21), dyspnea, HTN, CAD (MI, s/p DES to RCA in [place], [place] 05/2018 ), BPH, bowel resection (2006), multi-trauma fall (08/17/20, see below). He was hospitalized 08/17/20-10/09/20 after falling fall from a second story wall or stairs sustaining bilateral rib fractures, right pneumothorax, T8 vertebral body fracture, T7 neural arch fracture, T4-8 right transverse process fractures, right C7 transverse process fracture through foramen, and facial lacerations. He failed vent wean and underwent tracheostomy and PEG  placement on 09/03/20. 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 Pt was scheduled for MICROLARYNGOSCOPY With Co2 laser excision of subglotic stenosis WITH Balloon DILATION, however it was postponed for cardiac stress test. Pt arrives today with his sister, Dina Rich, wearing a Shiley #4 trach.  Behavioral/Cognition alert and cooperative  Mobility Status  ambulatory  Balance Screen  Has the patient fallen in the past 6 months No  Has the patient had a decrease in activity level because of a fear of falling?  No  Is the patient reluctant to leave their home because of a fear of falling?  No  Prior Functional Status  Cognitive/Linguistic Baseline WFL  Type of Home House   Lives With Family (sister, [name])  Available Support Family  Cognition  Overall Cognitive Status Within Functional Limits for tasks assessed  Auditory Comprehension  Overall Auditory Comprehension Appears within functional limits for tasks assessed  Reading Comprehension  Reading Status Not tested  Expression  Primary Mode of Expression Verbal  Verbal Expression  Overall Verbal Expression Appears within functional limits for tasks assessed  Interfering Components  (dysphonia with tracheostomy)  Non-Verbal Means of Communication Writing  Written Expression  Dominant Hand Left  Written Expression Not tested  Oral Motor/Sensory Function  Overall Oral Motor/Sensory Function Appears within functional limits for tasks assessed  Motor Speech  Overall Motor Speech Impaired  Respiration Impaired  Level of Impairment Word  Phonation Aphonic  Resonance [facility]  Articulation [facility]  Intelligibility Intelligibility reduced  Word 0-24% accurate  Motor Planning [facility]  Interfering Components  (inability to wear PMSV)      01/22/21 1424  Abuse/Neglect  Unable to ask? Yes  Information provided on Community resources No  Patient Literacy  How often do you need to have someone help you when you read instructions, pamphlets, or other written materials from your doctor or pharmacy? 1 - Never  Gaffer Needed? No  Comments  Comments no cause for concern  Information entered by : Havery Moros, MS, CCC-SLP     01/22/21 1426  SLP Visits / Re-Eval  Visit Number 1  Number of Visits 3  Date for SLP Re-Evaluation 02/28/21  Authorization  Authorization  Type Medicaid UHC 27 visits combined PT/OT/SLP  SLP Time Calculation  SLP Start Time 1430  SLP Stop Time  1515  SLP Time Calculation (min) 45 min  SLP - End of Session  Activity Tolerance Patient tolerated treatment well     01/22/21 1430  SLP SHORT TERM GOAL #1  Title Pt will return to SLP following ENT surgery and clearance for continued trials of PMSV  Baseline surgery has been postponed  Time 4  Period Weeks  Status New  Target Date 02/28/21  SLP SHORT TERM GOAL #2  Title Pt will wear PMSV for 10 minute intervals 5+x/day with support from his sister  Baseline not wearing his PMSV  Time 4  Period Weeks  Status New  Target Date 01/29/21     01/22/21 1428  SLP Assessment/Plan  Clinical Impression Statement Pt presents with severe dysphonia with limited ability to tolerate PMSV. Today's session was spent attempting to wear PMSV comfortably. Pt does exhibit some air trapping, but wore PMSV on and off for 20 minutes with max cues for encouragement (used pulse ox) and breath support. SLP provided max cues for straw breathing and straw phonation. Pt achieved occasional voicing with with pressure and  tension. Pt is being followed by Dr. Jenne Pane and will have a procedure to help correct subglottic stenosis. Will defer additional SLP visits until that procedure has been completed. Pt's sister, Dina Rich, was encouraged to go ahead and make an appointment with me for several weeks out so that he does not fall through the cracks.   Speech Therapy Frequency 1x /week  Duration 4 weeks (Plan to see Pt after his ENT procedure)  Treatment/Interventions Patient/family education;Compensatory strategies;SLP instruction and feedback  Potential to Achieve Goals Fair  Potential Considerations Severity of impairments  SLP Home Exercise Plan Pt will completed HEP as assigned to facilitate carryover of treatment strategies and techniques in home environment with use of written cues as needed  Consulted  and Agree with Plan of Care Patient;Family member/caregiver  Family Member Consulted Sister, Dina Rich    Patient will benefit from skilled therapeutic intervention in order to improve the following deficits and impairments:   No diagnosis found.    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

## 2021-01-29 ENCOUNTER — Telehealth: Payer: Self-pay | Admitting: *Deleted

## 2021-01-29 NOTE — Telephone Encounter (Signed)
Urine drug screen for this encounter is consistent for prescribed medication 

## 2021-01-30 ENCOUNTER — Ambulatory Visit: Payer: Medicaid Other | Admitting: Cardiology

## 2021-02-01 ENCOUNTER — Other Ambulatory Visit: Payer: Self-pay | Admitting: Otolaryngology

## 2021-02-02 ENCOUNTER — Encounter (HOSPITAL_COMMUNITY): Payer: Self-pay | Admitting: Otolaryngology

## 2021-02-04 ENCOUNTER — Other Ambulatory Visit: Payer: Self-pay

## 2021-02-04 ENCOUNTER — Encounter (HOSPITAL_COMMUNITY): Payer: Self-pay | Admitting: Otolaryngology

## 2021-02-04 ENCOUNTER — Other Ambulatory Visit: Payer: Self-pay | Admitting: Internal Medicine

## 2021-02-04 NOTE — Progress Notes (Signed)
PCP - Dr. Posey Pronto Cardiologist - Dr. Domenic Polite EKG - 10/30/20 Chest x-ray - 10/30/20 ECHO - 01/25/21 Cardiac Cath -  CPAP -    Blood Thinner Instructions:  Aspirin Instructions: per sister (POA) she was instructed by MD to stop 5 days prior to surgery - pt lst dose ASA 9/15  ERAS Protcol -   COVID TEST- DOS  Anesthesia review: yes  -------------  SDW INSTRUCTIONS:  Your procedure is scheduled on Wednesday 9/21. Please report to [facility] Main Entrance "A" at 0930 A.M., and check in at the Admitting office. Call this number if you have problems the morning of surgery: [phone]   Remember: Do not eat or drink after midnight the night before your surgery  Medications to take morning of surgery with a sip of water include: buPROPion (WELLBUTRIN XL)  busPIRone (BUSPAR)  pantoprazole (PROTONIX)  predniSONE (DELTASONE) QUEtiapine (SEROQUEL)  If needed: acetaminophen (TYLENOL)  albuterol (PROAIR HFA) --- Please bring all inhalers with you the day of surgery.  oxyCODONE (OXY IR/ROXICODONE)    As of today, STOP taking any Aleve, Naproxen, Ibuprofen, Motrin, Advil, Goody's, BC's, all herbal medications, fish oil, and all vitamins.    The Morning of Surgery Do not wear jewelry Do not wear lotions, powders, colognes, or deodorant Do not bring valuables to the hospital. [facility] is not responsible for any belongings or valuables.  If you are a smoker, DO NOT Smoke 24 hours prior to surgery  If you wear a CPAP at night please bring your mask the morning of surgery   Remember that you must have someone to transport you home after your surgery, and remain with you for 24 hours if you are discharged the same day.  Please bring cases for contacts, glasses, hearing aids, dentures or bridgework because it cannot be worn into surgery.   Patients discharged the day of surgery will not be allowed to drive home.   Please shower the NIGHT BEFORE/MORNING OF SURGERY (use antibacterial  soap like DIAL soap if possible). Wear comfortable clothes the morning of surgery. Oral Hygiene is also important to reduce your risk of infection.  Remember - BRUSH YOUR TEETH THE MORNING OF SURGERY WITH YOUR REGULAR TOOTHPASTE  Patient denies shortness of breath, fever, cough and chest pain.

## 2021-02-05 ENCOUNTER — Ambulatory Visit (INDEPENDENT_AMBULATORY_CARE_PROVIDER_SITE_OTHER): Payer: Medicaid Other | Admitting: Internal Medicine

## 2021-02-05 ENCOUNTER — Encounter: Payer: Self-pay | Admitting: Internal Medicine

## 2021-02-05 VITALS — BP 127/84 | HR 92 | Temp 98.4°F | Resp 16 | Ht 68.0 in | Wt 181.1 lb

## 2021-02-05 DIAGNOSIS — J449 Chronic obstructive pulmonary disease, unspecified: Secondary | ICD-10-CM | POA: Diagnosis not present

## 2021-02-05 DIAGNOSIS — J9611 Chronic respiratory failure with hypoxia: Secondary | ICD-10-CM

## 2021-02-05 DIAGNOSIS — S22000G Wedge compression fracture of unspecified thoracic vertebra, subsequent encounter for fracture with delayed healing: Secondary | ICD-10-CM | POA: Diagnosis not present

## 2021-02-05 DIAGNOSIS — F418 Other specified anxiety disorders: Secondary | ICD-10-CM

## 2021-02-05 DIAGNOSIS — J386 Stenosis of larynx: Secondary | ICD-10-CM

## 2021-02-05 NOTE — Assessment & Plan Note (Signed)
Well-controlled with Seroquel, Buspar and Clonazepam Will follow up with Psychiatry referral Currently refilled by [facility]

## 2021-02-05 NOTE — Assessment & Plan Note (Signed)
Had thoracic brace in place Followed by Spine surgery and PMNR

## 2021-02-05 NOTE — Assessment & Plan Note (Signed)
Has trach in place Planned to have laryngoscopy and balloon dilation

## 2021-02-05 NOTE — Anesthesia Preprocedure Evaluation (Addendum)
Anesthesia Evaluation  Patient identified by MRN, date of birth, ID band Patient awake    Reviewed: Allergy & Precautions, NPO status , Patient's Chart, lab work & pertinent test results  Airway Mallampati: Trach       Dental  (+) Teeth Intact   Pulmonary asthma , COPD, former smoker,     + decreased breath sounds      Cardiovascular hypertension, + CAD, + Past MI and + Cardiac Stents   Rhythm:Regular Rate:Normal  Echo:  1. Left ventricular ejection fraction, by estimation, is 50 to 55%. The  left ventricle has low normal function. The left ventricle has no regional  wall motion abnormalities. There is mild left ventricular hypertrophy.  Left ventricular diastolic  parameters are consistent with Grade I diastolic dysfunction (impaired  relaxation).  2. Right ventricular systolic function is normal. The right ventricular  size is normal.  3. The mitral valve is normal in structure. No evidence of mitral valve  regurgitation. No evidence of mitral stenosis.  4. The aortic valve is tricuspid. Aortic valve regurgitation is not  visualized. No aortic stenosis is present.  5. IVC is small suggesting low RA pressure and hypovolemia.    Neuro/Psych PSYCHIATRIC DISORDERS Anxiety Depression negative neurological ROS     GI/Hepatic negative GI ROS, Neg liver ROS,   Endo/Other  negative endocrine ROS  Renal/GU negative Renal ROS     Musculoskeletal  (+) Arthritis ,   Abdominal Normal abdominal exam  (+)   Peds  Hematology negative hematology ROS (+)   Anesthesia Other Findings   Reproductive/Obstetrics                           Anesthesia Physical Anesthesia Plan  ASA: 3  Anesthesia Plan: General   Post-op Pain Management:    Induction: Intravenous  PONV Risk Score and Plan: 3 and Ondansetron, Midazolam and Dexamethasone  Airway Management Planned: Tracheostomy  Additional  Equipment: None  Intra-op Plan:   Post-operative Plan: Possible Post-op intubation/ventilation  Informed Consent: I have reviewed the patients History and Physical, chart, labs and discussed the procedure including the risks, benefits and alternatives for the proposed anesthesia with the patient or authorized representative who has indicated his/her understanding and acceptance.       Plan Discussed with: CRNA  Anesthesia Plan Comments: (See recent PAT note 01/15/21 by Shonna Chock, PA-C for full history. In the interim, pt has completed recommended cardiac testing. Nuclear stress showed evidence of prior infarct, no current ischemia, low risk. Echo showed ef 50-55%, normal wall motion, normal valves.    Nuclear stress 01/25/21: .  Findings are consistent with prior inferior myocardial infarction. There is no current ischemia. The study is low risk. .  No ST deviation was noted. .  Left ventricular function is normal. Nuclear stress EF: 50 %. The left ventricular ejection fraction is low normal). End diastolic cavity size is normal.  TTE 01/25/21: 1. Left ventricular ejection fraction, by estimation, is 50 to 55%. The  left ventricle has low normal function. The left ventricle has no regional  wall motion abnormalities. There is mild left ventricular hypertrophy.  Left ventricular diastolic  parameters are consistent with Grade I diastolic dysfunction (impaired  relaxation).  2. Right ventricular systolic function is normal. The right ventricular  size is normal.  3. The mitral valve is normal in structure. No evidence of mitral valve  regurgitation. No evidence of mitral stenosis.  4. The aortic valve  is tricuspid. Aortic valve regurgitation is not  visualized. No aortic stenosis is present.  5. IVC is small suggesting low RA pressure and hypovolemia. )     Anesthesia Quick Evaluation

## 2021-02-05 NOTE — Assessment & Plan Note (Addendum)
Due to COPD Home O2 PRN Well-controlled with Dulera Symbicort and PRN Albuterol F/u with Pulmonology

## 2021-02-05 NOTE — Patient Instructions (Signed)
Please continue taking medications as prescribed.  Please contact us if you don't have any follow up appointment call with Behavioral health therapist within next 2 weeks.

## 2021-02-05 NOTE — Progress Notes (Signed)
Established Patient Office Visit  Subjective:  Patient ID: Charles Weeks, male    DOB: 29-Mar-1966  Age: 55 y.o. 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  He has been having episodes of dyspnea and is going to have laryngoscopy with possible balloon dilation. He still has trach in place. His sister thinks that he has panic episodes at times and that also worsens his dyspnea.  He has been on Seroquel, Buspar and Clonazepam for panic disorder. He denies any SI or HI currently.  Past Medical History:  Diagnosis Date   Anxiety 01/19/2020   Arthritis    Asthma    Benign prostatic hyperplasia with weak urinary stream 12/08/2019   BPH (benign prostatic hyperplasia) 08/17/2020   Chronic respiratory failure with hypoxia (HCC) 01/24/2020   Has home 02 but not using as of 01/24/2020  -  01/24/2020   Walked RA  approx   200 ft  @ slow pace  stopped due to  Dizzy with sats still 95%      Cigarette smoker 01/24/2020   Stopped regular smoking 10/2019 but still "now and then" as of 01/24/2020    COPD (chronic obstructive pulmonary disease) (HCC) 08/17/2020   gold   COPD GOLD ?     Quit smoking 10/2019  - Labs ordered 01/24/2020  :  allergy profile   alpha one AT phenotype   - 01/24/2020  After extensive coaching inhaler device,  effectiveness =    75% (short Ti) > change symb 80 to 160 2bid      Coronary artery disease 06/08/2018   DES to RCA, [facility] [place], [place])   Depression, major, single episode, moderate (HCC) 01/19/2020   Dyspnea    Encounter for screening for malignant neoplasm of colon 12/08/2019   Essential hypertension 12/08/2019   History of MI (myocardial infarction) 01/19/2020   HTN (hypertension) 08/17/2020    Myocardial infarct (HCC) 2019   Pneumonia     Past Surgical History:  Procedure Laterality Date   BOWEL RESECTION  2006   PEG PLACEMENT N/A 09/03/2020   Procedure: PERCUTANEOUS ENDOSCOPIC GASTROSTOMY (PEG) PLACEMENT;  Surgeon: Violeta Gelinas, MD;  Location: [facility] OR;  Service: General;  Laterality: N/A;   PERCUTANEOUS TRACHEOSTOMY N/A 09/03/2020   Procedure: PERCUTANEOUS TRACHEOSTOMY USING 6 SHILEY;  Surgeon: Violeta Gelinas, MD;  Location: [facility] OR;  Service: General;  Laterality: N/A;   SKIN GRAFT     TONSILLECTOMY AND ADENOIDECTOMY      Family History  Problem Relation Age of Onset   Heart disease Mother    Heart disease Father    Diabetes Sister     Social History   Socioeconomic History   Marital status: Single    Spouse name: Not on file   Number of children: 2   Years of education: Not on file   Highest education level: 7th grade  Occupational History   Not on file  Tobacco Use   Smoking status: Former    Packs/day: 1.50    Types: Cigarettes    Quit date: 08/17/2020    Years since quitting: 0.4   Smokeless tobacco: Never  Vaping Use   Vaping Use: Never used  Substance and Sexual  Activity   Alcohol use: Not Currently   Drug use: Not Currently   Sexual activity: Not on file  Other Topics Concern   Not on file  Social History Narrative   ** Merged History Encounter **       Lives with sister  Both daughters live close by   Sister has dog   Enjoys: shooting pool, fishing, Therapist, nutritional  Diet: eats all food groups Caffeine: pop-dr pepper 2 liter  Water: 4-6 cups   Wears seat belt  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 No weapons        Social Determinants of Corporate investment banker Strain: Not on file  Food Insecurity: Not on file  Transportation Needs: Not on file  Physical Activity: Not on file  Stress: Not on file  Social Connections: Not on file  Intimate Partner Violence: Not on file    Outpatient Medications Prior to  Visit  Medication Sig Dispense Refill   acetaminophen (TYLENOL) 325 MG tablet Take 2 tablets (650 mg total) by mouth every 6 (six) hours as needed for mild pain or moderate pain.     albuterol (PROAIR HFA) 108 (90 Base) MCG/ACT inhaler Inhale 1-2 puffs into the lungs every 4 (four) hours as needed for wheezing or shortness of breath. 8.5 g 11   albuterol (PROVENTIL) (2.5 MG/3ML) 0.083% nebulizer solution Take 3 mLs (2.5 mg total) by nebulization every 6 (six) hours as needed for wheezing or shortness of breath. 75 mL 12   aspirin 81 MG chewable tablet Chew 1 tablet (81 mg total) by mouth daily.     bethanechol (URECHOLINE) 25 MG tablet TAKE TWO TABLETS BY MOUTH FOUR TIMES DAILY 180 tablet 0   budesonide-formoterol (SYMBICORT) 160-4.5 MCG/ACT inhaler Take 2 puffs first thing in am and then another 2 puffs about 12 hours later. (Patient taking differently: Inhale 2 puffs into the lungs in the morning and at bedtime. Take 2 puffs first thing in am and then another 2 puffs about 12 hours later.) 1 each 12   buPROPion (WELLBUTRIN XL) 300 MG 24 hr tablet TAKE 1 TABLET BY MOUTH ONCE DAILY. 30 tablet 0   busPIRone (BUSPAR) 10 MG tablet TAKE ONE TABLET BY MOUTH 3 TIMES A DAY 90 tablet 0   Calcium Carb-Cholecalciferol (CALCIUM 600/VITAMIN D) 600-400 MG-UNIT CHEW Chew 1 each by mouth daily.     Cholecalciferol (VITAMIN D3) 125 MCG (5000 UT) CAPS Take 5,000 Units by mouth daily.     clonazePAM (KLONOPIN) 0.5 MG tablet TAKE (1) TABLET BY MOUTH AT BEDTIME. 30 tablet 2   docusate sodium (COLACE) 100 MG capsule Take 100 mg by mouth daily as needed for moderate constipation.     folic acid (FOLVITE) 1 MG tablet TAKE ONE TABLET BY MOUTH ONCE DAILY. 30 tablet 0   mometasone-formoterol (DULERA) 200-5 MCG/ACT AERO Inhale 2 puffs into the lungs 2 (two) times daily.     montelukast (SINGULAIR) 10 MG tablet TAKE ONE TABLET BY MOUTH AT BEDTIME 30 tablet 0   nitroGLYCERIN (NITROSTAT) 0.4 MG SL tablet Place 0.4 mg under the  tongue every 5 (five) minutes as needed for chest pain.     oxyCODONE (OXY IR/ROXICODONE) 5 MG immediate release tablet Take 1 tablet (5 mg total) by mouth every 6 (six) hours as needed for moderate pain. 100 tablet 0   OXYGEN Inhale 5 L into the lungs continuous.     pantoprazole (PROTONIX) 40 MG tablet TAKE ONE  TABLET BY MOUTH ONCE DAILY. 30 tablet 0   predniSONE (DELTASONE) 5 MG tablet TAKE 1 TABLET BY MOUTH ONCE A DAY WITH BREAKFAST. 30 tablet 0   QUEtiapine (SEROQUEL) 300 MG tablet Take 1/2 Tablet twice a day and one tablet at bedtime (Patient taking differently: Take 150-300 mg by mouth See admin instructions. Take 1/2 Tablet (150 mg) twice a day and one tablet (300 mg) at bedtime) 60 tablet 2   rosuvastatin (CRESTOR) 5 MG tablet Take 1 tablet (5 mg total) by mouth daily. (Patient taking differently: Take 5 mg by mouth at bedtime.) 90 tablet 3   scopolamine (TRANSDERM-SCOP) 1 MG/3DAYS Place 1 patch (1.5 mg total) onto the skin every 3 (three) days. 10 patch 12   tamsulosin (FLOMAX) 0.4 MG CAPS capsule TAKE (1) CAPSULE BY MOUTH TWICE DAILY. 60 capsule 0   terazosin (HYTRIN) 2 MG capsule TAKE ONE CAPSULE BY MOUTH ONCE DAILY 30 capsule 0   No facility-administered medications prior to visit.    No Known Allergies  ROS Review of Systems  Constitutional:  Negative for chills and fever.  HENT:  Negative for congestion and sore throat.   Eyes:  Negative for pain and discharge.  Respiratory:  Negative for cough and shortness of breath.   Cardiovascular:  Negative for chest pain and palpitations.  Gastrointestinal:  Positive for constipation. Negative for diarrhea, nausea and vomiting.  Endocrine: Negative for polydipsia and polyuria.  Genitourinary:  Positive for difficulty urinating. Negative for dysuria and hematuria.  Musculoskeletal:  Positive for arthralgias, back pain, gait problem, myalgias and neck pain.  Skin:  Negative for rash.  Neurological:  Negative for dizziness, weakness,  numbness and headaches.  Psychiatric/Behavioral:  Positive for decreased concentration, dysphoric mood and sleep disturbance. The patient is nervous/anxious.      Objective:    Physical Exam Vitals reviewed.  Constitutional:      General: He is not in acute distress.    Appearance: He is not diaphoretic.  HENT:     Head: Normocephalic and atraumatic.     Nose: Nose normal.     Mouth/Throat:     Mouth: Mucous membranes are moist.  Eyes:     General: No scleral icterus.    Extraocular Movements: Extraocular movements intact.  Cardiovascular:     Rate and Rhythm: Normal rate and regular rhythm.     Pulses: Normal pulses.     Heart sounds: Normal heart sounds. No murmur heard. Pulmonary:     Breath sounds: Normal breath sounds. No wheezing or rales.     Comments: Tracheostomy in place Abdominal:     Palpations: Abdomen is soft.     Tenderness: There is no abdominal tenderness.  Musculoskeletal:     Cervical back: Neck supple. No tenderness.     Right lower leg: No edema.     Left lower leg: No edema.  Skin:    General: Skin is warm.     Findings: No rash.  Neurological:     General: No focal deficit present.     Mental Status: He is alert and oriented to person, place, and time.     Sensory: No sensory deficit.     Motor: No weakness.  Psychiatric:        Mood and Affect: Mood normal.        Behavior: Behavior normal.    BP 127/84 (BP Location: Left Arm, Patient Position: Sitting, Cuff Size: Normal)   Pulse 92   Temp 98.4 F (36.9 C) (  Oral)   Resp 16   Ht 5\' 8"  (1.727 m)   Wt 181 lb 1.9 oz (82.2 kg)   SpO2 91%   BMI 27.54 kg/m  Wt Readings from Last 3 Encounters:  02/05/21 181 lb 1.9 oz (82.2 kg)  01/23/21 185 lb (83.9 kg)  12/25/20 180 lb (81.6 kg)     Health Maintenance Due  Topic Date Due   Hepatitis C Screening  Never done   COLONOSCOPY (Pts 45-14yrs Insurance coverage will need to be confirmed)  Never done   Zoster Vaccines- Shingrix (1 of 2) Never  done   COVID-19 Vaccine (3 - Booster for Moderna series) 06/18/2020   INFLUENZA VACCINE  Never done    There are no preventive care reminders to display for this patient.  No results found for: TSH Lab Results  Component Value Date   WBC 9.7 10/19/2020   HGB 12.0 (L) 10/19/2020   HCT 39.2 10/19/2020   MCV 91.4 10/19/2020   PLT 345 10/19/2020   Lab Results  Component Value Date   NA 139 10/10/2020   K 3.9 10/10/2020   CO2 30 10/10/2020   GLUCOSE 119 (H) 10/10/2020   BUN 17 10/10/2020   CREATININE 0.85 10/10/2020   BILITOT 0.7 10/10/2020   ALKPHOS 105 10/10/2020   AST 20 10/10/2020   ALT 27 10/10/2020   PROT 6.9 10/10/2020   ALBUMIN 3.4 (L) 10/10/2020   CALCIUM 9.4 10/10/2020   ANIONGAP 9 10/10/2020   Lab Results  Component Value Date   CHOL 158 11/07/2019   Lab Results  Component Value Date   HDL 45 11/07/2019   Lab Results  Component Value Date   LDLCALC 81 11/07/2019   Lab Results  Component Value Date   TRIG 140 09/05/2020   Lab Results  Component Value Date   CHOLHDL 3.5 11/07/2019   Lab Results  Component Value Date   HGBA1C 6.2 (H) 08/19/2020      Assessment & Plan:   Problem List Items Addressed This Visit       Respiratory   COPD GOLD IV  - Primary    Well-controlled with Symbicort and PRN Albuterol F/u with Pulmonology      Chronic respiratory failure with hypoxia (HCC)    Due to COPD Home O2 PRN Well-controlled with Dulera Symbicort and PRN Albuterol F/u with Pulmonology      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   Follow-up: Return in about 5 months (around 07/08/2021) for Annual physical.    07/10/2021, MD

## 2021-02-05 NOTE — Assessment & Plan Note (Addendum)
Well-controlled with Symbicort and PRN Albuterol F/u with Pulmonology 

## 2021-02-06 ENCOUNTER — Ambulatory Visit (HOSPITAL_COMMUNITY): Payer: Medicaid Other | Admitting: Physician Assistant

## 2021-02-06 ENCOUNTER — Other Ambulatory Visit: Payer: Self-pay

## 2021-02-06 ENCOUNTER — Ambulatory Visit (HOSPITAL_COMMUNITY)
Admission: RE | Admit: 2021-02-06 | Discharge: 2021-02-06 | Disposition: A | Discharge status: Home / Self Care | Payer: Medicaid Other | Attending: Otolaryngology | Admitting: Otolaryngology

## 2021-02-06 ENCOUNTER — Encounter (HOSPITAL_COMMUNITY): Payer: Self-pay | Admitting: Otolaryngology

## 2021-02-06 ENCOUNTER — Encounter (HOSPITAL_COMMUNITY)
Admission: RE | Disposition: A | Discharge status: Home / Self Care | Payer: Self-pay | Source: Home / Self Care | Attending: Otolaryngology

## 2021-02-06 DIAGNOSIS — Z930 Tracheostomy status: Secondary | ICD-10-CM | POA: Diagnosis not present

## 2021-02-06 DIAGNOSIS — Z87891 Personal history of nicotine dependence: Secondary | ICD-10-CM | POA: Insufficient documentation

## 2021-02-06 DIAGNOSIS — J386 Stenosis of larynx: Secondary | ICD-10-CM | POA: Insufficient documentation

## 2021-02-06 DIAGNOSIS — Z955 Presence of coronary angioplasty implant and graft: Secondary | ICD-10-CM | POA: Diagnosis not present

## 2021-02-06 DIAGNOSIS — Z7982 Long term (current) use of aspirin: Secondary | ICD-10-CM | POA: Diagnosis not present

## 2021-02-06 DIAGNOSIS — Z7952 Long term (current) use of systemic steroids: Secondary | ICD-10-CM | POA: Insufficient documentation

## 2021-02-06 DIAGNOSIS — Z20822 Contact with and (suspected) exposure to covid-19: Secondary | ICD-10-CM | POA: Insufficient documentation

## 2021-02-06 DIAGNOSIS — Z79899 Other long term (current) drug therapy: Secondary | ICD-10-CM | POA: Diagnosis not present

## 2021-02-06 DIAGNOSIS — I252 Old myocardial infarction: Secondary | ICD-10-CM | POA: Diagnosis not present

## 2021-02-06 DIAGNOSIS — Z7951 Long term (current) use of inhaled steroids: Secondary | ICD-10-CM | POA: Diagnosis not present

## 2021-02-06 HISTORY — PX: MICROLARYNGOSCOPY WITH LASER AND BALLOON DILATION: SHX5973

## 2021-02-06 HISTORY — PX: BALLOON DILATION: SHX5330

## 2021-02-06 LAB — CBC
HCT: 45.5 % (ref 39.0–52.0)
Hemoglobin: 15 g/dL (ref 13.0–17.0)
MCH: 29.5 pg (ref 26.0–34.0)
MCHC: 33 g/dL (ref 30.0–36.0)
MCV: 89.6 fL (ref 80.0–100.0)
Platelets: 209 10*3/uL (ref 150–400)
RBC: 5.08 MIL/uL (ref 4.22–5.81)
RDW: 15.9 % — ABNORMAL HIGH (ref 11.5–15.5)
WBC: 9.8 10*3/uL (ref 4.0–10.5)
nRBC: 0 % (ref 0.0–0.2)

## 2021-02-06 LAB — BASIC METABOLIC PANEL
Anion gap: 11 (ref 5–15)
BUN: 12 mg/dL (ref 6–20)
CO2: 25 mmol/L (ref 22–32)
Calcium: 9.1 mg/dL (ref 8.9–10.3)
Chloride: 103 mmol/L (ref 98–111)
Creatinine, Ser: 1.15 mg/dL (ref 0.61–1.24)
GFR, Estimated: >60 mL/min (ref >60)
Glucose, Bld: 98 mg/dL (ref 70–99)
Potassium: 4.7 mmol/L (ref 3.5–5.1)
Sodium: 139 mmol/L (ref 135–145)

## 2021-02-06 LAB — SARS CORONAVIRUS 2 BY RT PCR (HOSPITAL ORDER, PERFORMED IN ~~LOC~~ HOSPITAL LAB): SARS Coronavirus 2: NEGATIVE

## 2021-02-06 SURGERY — MICROLARYNGOSCOPY WITH LASER AND BALLOON DILATION
Anesthesia: General | Site: Throat

## 2021-02-06 MED ORDER — ACETAMINOPHEN 160 MG/5ML PO SOLN: 325.0000 mg | ORAL | Status: DC | PRN

## 2021-02-06 MED ORDER — SUGAMMADEX SODIUM 200 MG/2ML IV SOLN
INTRAVENOUS | Status: DC | PRN
  Administered 2021-02-06: 200 mg via INTRAVENOUS

## 2021-02-06 MED ORDER — 0.9 % SODIUM CHLORIDE (POUR BTL) OPTIME
TOPICAL | Status: DC | PRN
  Administered 2021-02-06: 1000 mL

## 2021-02-06 MED ORDER — PROPOFOL 10 MG/ML IV BOLUS
INTRAVENOUS | Status: AC
  Filled 2021-02-06: qty 20

## 2021-02-06 MED ORDER — LIDOCAINE HCL (CARDIAC) PF 100 MG/5ML IV SOSY
PREFILLED_SYRINGE | INTRAVENOUS | Status: DC | PRN
  Administered 2021-02-06: 40 mg via INTRAVENOUS

## 2021-02-06 MED ORDER — FENTANYL CITRATE (PF) 250 MCG/5ML IJ SOLN
INTRAMUSCULAR | Status: AC
  Filled 2021-02-06: qty 5

## 2021-02-06 MED ORDER — AMISULPRIDE (ANTIEMETIC) 5 MG/2ML IV SOLN: 10.0000 mg | Freq: Once | INTRAVENOUS | Status: DC | PRN

## 2021-02-06 MED ORDER — LACTATED RINGERS IV SOLN: INTRAVENOUS | Status: DC

## 2021-02-06 MED ORDER — OXYMETAZOLINE HCL 0.05 % NA SOLN
NASAL | Status: AC
  Filled 2021-02-06: qty 30

## 2021-02-06 MED ORDER — MIDAZOLAM HCL 5 MG/5ML IJ SOLN
INTRAMUSCULAR | Status: DC | PRN
  Administered 2021-02-06: 2 mg via INTRAVENOUS

## 2021-02-06 MED ORDER — MIDAZOLAM HCL 2 MG/2ML IJ SOLN
INTRAMUSCULAR | Status: AC
  Filled 2021-02-06: qty 2

## 2021-02-06 MED ORDER — ROCURONIUM BROMIDE 10 MG/ML (PF) SYRINGE
PREFILLED_SYRINGE | INTRAVENOUS | Status: DC | PRN
  Administered 2021-02-06: 50 mg via INTRAVENOUS

## 2021-02-06 MED ORDER — OXYCODONE HCL 5 MG/5ML PO SOLN: 5.0000 mg | Freq: Once | ORAL | Status: DC | PRN

## 2021-02-06 MED ORDER — PROPOFOL 10 MG/ML IV BOLUS
INTRAVENOUS | Status: DC | PRN
  Administered 2021-02-06: 120 mg via INTRAVENOUS

## 2021-02-06 MED ORDER — ORAL CARE MOUTH RINSE: 15.0000 mL | Freq: Once | OROMUCOSAL | Status: AC

## 2021-02-06 MED ORDER — FENTANYL CITRATE (PF) 100 MCG/2ML IJ SOLN
25.0000 ug | INTRAMUSCULAR | Status: DC | PRN
  Administered 2021-02-06 (×2): 50 ug via INTRAVENOUS

## 2021-02-06 MED ORDER — TRIAMCINOLONE ACETONIDE 40 MG/ML IJ SUSP
INTRAMUSCULAR | Status: AC
  Filled 2021-02-06: qty 5

## 2021-02-06 MED ORDER — EPINEPHRINE PF 1 MG/ML IJ SOLN
INTRAMUSCULAR | Status: DC | PRN
  Administered 2021-02-06: 1 mg

## 2021-02-06 MED ORDER — PROMETHAZINE HCL 25 MG/ML IJ SOLN: 6.2500 mg | INTRAMUSCULAR | Status: DC | PRN

## 2021-02-06 MED ORDER — CHLORHEXIDINE GLUCONATE 0.12 % MT SOLN
OROMUCOSAL | Status: AC
  Administered 2021-02-06: 15 mL via OROMUCOSAL
  Filled 2021-02-06: qty 15

## 2021-02-06 MED ORDER — ONDANSETRON HCL 4 MG/2ML IJ SOLN
INTRAMUSCULAR | Status: DC | PRN
  Administered 2021-02-06: 4 mg via INTRAVENOUS

## 2021-02-06 MED ORDER — EPINEPHRINE HCL (NASAL) 0.1 % NA SOLN
NASAL | Status: AC
  Filled 2021-02-06: qty 30

## 2021-02-06 MED ORDER — ACETAMINOPHEN 325 MG PO TABS: 325.0000 mg | ORAL_TABLET | ORAL | Status: DC | PRN

## 2021-02-06 MED ORDER — ACETAMINOPHEN 10 MG/ML IV SOLN
1000.0000 mg | Freq: Once | INTRAVENOUS | Status: DC | PRN
  Administered 2021-02-06: 1000 mg via INTRAVENOUS

## 2021-02-06 MED ORDER — CHLORHEXIDINE GLUCONATE 0.12 % MT SOLN: 15.0000 mL | Freq: Once | OROMUCOSAL | Status: AC

## 2021-02-06 MED ORDER — ACETAMINOPHEN 10 MG/ML IV SOLN
INTRAVENOUS | Status: AC
  Filled 2021-02-06: qty 100

## 2021-02-06 MED ORDER — FENTANYL CITRATE (PF) 100 MCG/2ML IJ SOLN
INTRAMUSCULAR | Status: DC | PRN
  Administered 2021-02-06 (×2): 50 ug via INTRAVENOUS

## 2021-02-06 MED ORDER — STERILE WATER FOR IRRIGATION IR SOLN
Status: DC | PRN
  Administered 2021-02-06: 1000 mL

## 2021-02-06 MED ORDER — OXYCODONE HCL 5 MG PO TABS
5.0000 mg | ORAL_TABLET | Freq: Once | ORAL | Status: DC | PRN
Start: 2021-02-06 — End: 2021-02-06

## 2021-02-06 MED ORDER — FENTANYL CITRATE (PF) 100 MCG/2ML IJ SOLN
INTRAMUSCULAR | Status: AC
  Filled 2021-02-06: qty 2

## 2021-02-06 MED ORDER — DEXAMETHASONE SODIUM PHOSPHATE 10 MG/ML IJ SOLN
INTRAMUSCULAR | Status: DC | PRN
  Administered 2021-02-06: 5 mg via INTRAVENOUS

## 2021-02-06 SURGICAL SUPPLY — 42 items
BAG COUNTER SPONGE SURGICOUNT (BAG) ×3 IMPLANT
BALLN PULM 12 13.5 15X75 (BALLOONS)
BALLN PULM 15 16.5 18X75 (BALLOONS) ×3
BALLN PULMONARY 10-12 (MISCELLANEOUS) IMPLANT
BALLN PULMONARY 8-10 OD75 (MISCELLANEOUS) IMPLANT
BALLOON PULM 12 13.5 15X75 (BALLOONS) IMPLANT
BALLOON PULM 15 16.5 18X75 (BALLOONS) ×2 IMPLANT
BLADE SURG 15 STRL LF DISP TIS (BLADE) IMPLANT
BLADE SURG 15 STRL SS (BLADE)
BNDG EYE OVAL (GAUZE/BANDAGES/DRESSINGS) ×6 IMPLANT
CANISTER SUCT 3000ML PPV (MISCELLANEOUS) ×3 IMPLANT
CNTNR URN SCR LID CUP LEK RST (MISCELLANEOUS) IMPLANT
CONT SPEC 4OZ STRL OR WHT (MISCELLANEOUS)
COVER BACK TABLE 60X90IN (DRAPES) ×3 IMPLANT
COVER MAYO STAND STRL (DRAPES) ×3 IMPLANT
DRAPE HALF SHEET 40X57 (DRAPES) ×3 IMPLANT
GAUZE SPONGE 4X4 12PLY STRL (GAUZE/BANDAGES/DRESSINGS) ×3 IMPLANT
GLOVE SURG ENC MOIS LTX SZ7.5 (GLOVE) ×3 IMPLANT
GOWN STRL REUS W/ TWL LRG LVL3 (GOWN DISPOSABLE) IMPLANT
GOWN STRL REUS W/TWL LRG LVL3 (GOWN DISPOSABLE)
GUARD TEETH (MISCELLANEOUS) IMPLANT
HOLDER TRACH TUBE VELCRO 19.5 (MISCELLANEOUS) ×3 IMPLANT
KIT BASIN OR (CUSTOM PROCEDURE TRAY) ×3 IMPLANT
KIT TURNOVER KIT B (KITS) ×3 IMPLANT
MARKER SKIN DUAL TIP RULER LAB (MISCELLANEOUS) IMPLANT
NEEDLE HYPO 25GX1X1/2 BEV (NEEDLE) IMPLANT
NEEDLE TRANS ORAL INJECTION (NEEDLE) IMPLANT
NS IRRIG 1000ML POUR BTL (IV SOLUTION) ×3 IMPLANT
PAD ARMBOARD 7.5X6 YLW CONV (MISCELLANEOUS) ×6 IMPLANT
PATTIES SURGICAL .5 X1 (DISPOSABLE) IMPLANT
PATTIES SURGICAL .5 X3 (DISPOSABLE) ×3 IMPLANT
POSITIONER HEAD DONUT 9IN (MISCELLANEOUS) IMPLANT
SOL ANTI FOG 6CC (MISCELLANEOUS) ×2 IMPLANT
SOLUTION ANTI FOG 6CC (MISCELLANEOUS) ×1
SURGILUBE 2OZ TUBE FLIPTOP (MISCELLANEOUS) IMPLANT
SUT SILK 2 0 PERMA HAND 18 BK (SUTURE) IMPLANT
SYR GAUGE ALLIANCE SINGLE USE (MISCELLANEOUS) ×3 IMPLANT
SYR GAUGE ASSEMBLY ALLIANCE II (MISCELLANEOUS) ×3 IMPLANT
TOWEL GREEN STERILE FF (TOWEL DISPOSABLE) ×6 IMPLANT
TUBE CONNECTING 12X1/4 (SUCTIONS) ×3 IMPLANT
TUBE TRACH FLEX 4 UNCUF (MISCELLANEOUS) ×3 IMPLANT
WATER STERILE IRR 1000ML POUR (IV SOLUTION) ×3 IMPLANT

## 2021-02-06 NOTE — Transfer of Care (Signed)
Immediate Anesthesia Transfer of Care Note  Patient: Charles Weeks  Procedure(s) Performed: SUSPENDED MICRO DIRECT LARYNGOSCOPY (Throat) BALLOON DILATION (Throat)  Patient Location: PACU  Anesthesia Type:General  Level of Consciousness: awake  Airway & Oxygen Therapy: Patient Spontanous Breathing  Post-op Assessment: Report given to RN and Post -op Vital signs reviewed and stable  Post vital signs: stable  Last Vitals:  Vitals Value Taken Time  BP 153/89 02/06/21 1448  Temp 36.6 C 02/06/21 1448  Pulse 91 02/06/21 1454  Resp 21 02/06/21 1501  SpO2 95 % 02/06/21 1454  Vitals shown include unvalidated device data.  Last Pain:  Vitals:   02/06/21 1021  TempSrc:   PainSc: 8          Complications: No notable events documented.

## 2021-02-06 NOTE — H&P (Signed)
Charles Weeks is an 55 y.o. male.   Chief Complaint: Tracheostomy status, subglottic stenosis HPI: 55 year old male who fell two stories 08/17/20 resulting in multiple fractures of the spine and ribs.  As part of his care, a tracheostomy was placed.  He has not been able to tolerate Passy-Muir valve use.  Past Medical History:  Diagnosis Date   Anxiety 01/19/2020   Arthritis    Asthma    Benign prostatic hyperplasia with weak urinary stream 12/08/2019   BPH (benign prostatic hyperplasia) 08/17/2020   Chronic respiratory failure with hypoxia (HCC) 01/24/2020   Has home 02 but not using as of 01/24/2020  -  01/24/2020   Walked RA  approx   200 ft  @ slow pace  stopped due to  Dizzy with sats still 95%      Cigarette smoker 01/24/2020   Stopped regular smoking 10/2019 but still "now and then" as of 01/24/2020    COPD (chronic obstructive pulmonary disease) (HCC) 08/17/2020   gold   COPD GOLD ?     Quit smoking 10/2019  - Labs ordered 01/24/2020  :  allergy profile   alpha one AT phenotype   - 01/24/2020  After extensive coaching inhaler device,  effectiveness =    75% (short Ti) > change symb 80 to 160 2bid      Coronary artery disease 06/08/2018   DES to RCA, [facility] [place], [place])   Depression, major, single episode, moderate (HCC) 01/19/2020   Dyspnea    Encounter for screening for malignant neoplasm of colon 12/08/2019   Essential hypertension 12/08/2019   History of MI (myocardial infarction) 01/19/2020   HTN (hypertension) 08/17/2020   Myocardial infarct (HCC) 2019   Pneumonia     Past Surgical History:  Procedure Laterality Date   BOWEL RESECTION  2006   PEG PLACEMENT N/A 09/03/2020   Procedure: PERCUTANEOUS ENDOSCOPIC GASTROSTOMY (PEG) PLACEMENT;  Surgeon: Violeta Gelinas, MD;  Location: [facility] OR;  Service: General;  Laterality: N/A;   PERCUTANEOUS TRACHEOSTOMY N/A 09/03/2020   Procedure: PERCUTANEOUS TRACHEOSTOMY USING 6 SHILEY;  Surgeon: Violeta Gelinas, MD;  Location: [facility] OR;   Service: General;  Laterality: N/A;   SKIN GRAFT     TONSILLECTOMY AND ADENOIDECTOMY      Family History  Problem Relation Age of Onset   Heart disease Mother    Heart disease Father    Diabetes Sister    Social History:  reports that he quit smoking about 5 months ago. His smoking use included cigarettes. He smoked an average of 1.5 packs per day. He has never used smokeless tobacco. He reports that he does not currently use alcohol. He reports that he does not currently use drugs.  Allergies: No Known Allergies  Medications Prior to Admission  Medication Sig Dispense Refill   albuterol (PROAIR HFA) 108 (90 Base) MCG/ACT inhaler Inhale 1-2 puffs into the lungs every 4 (four) hours as needed for wheezing or shortness of breath. 8.5 g 11   aspirin 81 MG chewable tablet Chew 1 tablet (81 mg total) by mouth daily.     bethanechol (URECHOLINE) 25 MG tablet TAKE TWO TABLETS BY MOUTH FOUR TIMES DAILY 180 tablet 0   budesonide-formoterol (SYMBICORT) 160-4.5 MCG/ACT inhaler Take 2 puffs first thing in am and then another 2 puffs about 12 hours later. (Patient taking differently: Inhale 2 puffs into the lungs in the morning and at bedtime. Take 2 puffs first thing in am and then another 2 puffs about  12 hours later.) 1 each 12   buPROPion (WELLBUTRIN XL) 300 MG 24 hr tablet TAKE 1 TABLET BY MOUTH ONCE DAILY. 30 tablet 0   busPIRone (BUSPAR) 10 MG tablet TAKE ONE TABLET BY MOUTH 3 TIMES A DAY 90 tablet 0   Calcium Carb-Cholecalciferol (CALCIUM 600/VITAMIN D) 600-400 MG-UNIT CHEW Chew 1 each by mouth daily.     Cholecalciferol (VITAMIN D3) 125 MCG (5000 UT) CAPS Take 5,000 Units by mouth daily.     clonazePAM (KLONOPIN) 0.5 MG tablet TAKE (1) TABLET BY MOUTH AT BEDTIME. 30 tablet 2   docusate sodium (COLACE) 100 MG capsule Take 100 mg by mouth daily as needed for moderate constipation.     folic acid (FOLVITE) 1 MG tablet TAKE ONE TABLET BY MOUTH ONCE DAILY. 30 tablet 0   mometasone-formoterol  (DULERA) 200-5 MCG/ACT AERO Inhale 2 puffs into the lungs 2 (two) times daily.     montelukast (SINGULAIR) 10 MG tablet TAKE ONE TABLET BY MOUTH AT BEDTIME 30 tablet 0   oxyCODONE (OXY IR/ROXICODONE) 5 MG immediate release tablet Take 1 tablet (5 mg total) by mouth every 6 (six) hours as needed for moderate pain. 100 tablet 0   OXYGEN Inhale 5 L into the lungs continuous.     pantoprazole (PROTONIX) 40 MG tablet TAKE ONE TABLET BY MOUTH ONCE DAILY. 30 tablet 0   predniSONE (DELTASONE) 5 MG tablet TAKE 1 TABLET BY MOUTH ONCE A DAY WITH BREAKFAST. 30 tablet 0   QUEtiapine (SEROQUEL) 300 MG tablet Take 1/2 Tablet twice a day and one tablet at bedtime (Patient taking differently: Take 150-300 mg by mouth See admin instructions. Take 1/2 Tablet (150 mg) twice a day and one tablet (300 mg) at bedtime) 60 tablet 2   rosuvastatin (CRESTOR) 5 MG tablet Take 1 tablet (5 mg total) by mouth daily. (Patient taking differently: Take 5 mg by mouth at bedtime.) 90 tablet 3   scopolamine (TRANSDERM-SCOP) 1 MG/3DAYS Place 1 patch (1.5 mg total) onto the skin every 3 (three) days. 10 patch 12   tamsulosin (FLOMAX) 0.4 MG CAPS capsule TAKE (1) CAPSULE BY MOUTH TWICE DAILY. 60 capsule 0   terazosin (HYTRIN) 2 MG capsule TAKE ONE CAPSULE BY MOUTH ONCE DAILY 30 capsule 0   acetaminophen (TYLENOL) 325 MG tablet Take 2 tablets (650 mg total) by mouth every 6 (six) hours as needed for mild pain or moderate pain.     albuterol (PROVENTIL) (2.5 MG/3ML) 0.083% nebulizer solution Take 3 mLs (2.5 mg total) by nebulization every 6 (six) hours as needed for wheezing or shortness of breath. 75 mL 12   nitroGLYCERIN (NITROSTAT) 0.4 MG SL tablet Place 0.4 mg under the tongue every 5 (five) minutes as needed for chest pain.      Results for orders placed or performed during the hospital encounter of 02/06/21 (from the past 48 hour(s))  SARS Coronavirus 2 by RT PCR (hospital order, performed in [facility] hospital lab) Nasopharyngeal  Nasopharyngeal Swab     Status: None   Collection Time: 02/06/21  9:31 AM   Specimen: Nasopharyngeal Swab  Result Value Ref Range   SARS Coronavirus 2 NEGATIVE NEGATIVE    Comment: (NOTE) SARS-CoV-2 target nucleic acids are NOT DETECTED.  The SARS-CoV-2 RNA is generally detectable in upper and lower respiratory specimens during the acute phase of infection. The lowest concentration of SARS-CoV-2 viral copies this assay can detect is 250 copies / mL. A negative result does not preclude SARS-CoV-2 infection and should not  be used as the sole basis for treatment or other patient management decisions.  A negative result may occur with improper specimen collection / handling, submission of specimen other than nasopharyngeal swab, presence of viral mutation(s) within the areas targeted by this assay, and inadequate number of viral copies (<250 copies / mL). A negative result must be combined with clinical observations, patient history, and epidemiological information.  Fact Sheet for Patients:   BoilerBrush.com.cy  Fact Sheet for Healthcare Providers: https://pope.com/  This test is not yet approved or  cleared by the [place] FDA and has been authorized for detection and/or diagnosis of SARS-CoV-2 by FDA under an Emergency Use Authorization (EUA).  This EUA will remain in effect (meaning this test can be used) for the duration of the COVID-19 declaration under Section 564(b)(1) of the Act, 21 U.S.C. section 360bbb-3(b)(1), unless the authorization is terminated or revoked sooner.  Performed at [facility], [address], [place], [place] [address]   CBC per protocol     Status: Abnormal   Collection Time: 02/06/21  9:44 AM  Result Value Ref Range   WBC 9.8 4.0 - 10.5 K/uL   RBC 5.08 4.22 - 5.81 MIL/uL   Hemoglobin 15.0 13.0 - 17.0 g/dL   HCT 30.0 92.3 - 30.0 %   MCV 89.6 80.0 - 100.0 fL   MCH 29.5 26.0 - 34.0 pg    MCHC 33.0 30.0 - 36.0 g/dL   RDW 76.2 (H) 26.3 - 33.5 %   Platelets 209 150 - 400 K/uL   nRBC 0.0 0.0 - 0.2 %    Comment: Performed at [facility], [address] [place], [place] [address]  Basic metabolic panel per protocol     Status: None   Collection Time: 02/06/21  9:44 AM  Result Value Ref Range   Sodium 139 135 - 145 mmol/L   Potassium 4.7 3.5 - 5.1 mmol/L   Chloride 103 98 - 111 mmol/L   CO2 25 22 - 32 mmol/L   Glucose, Bld 98 70 - 99 mg/dL    Comment: Glucose reference range applies only to samples taken after fasting for at least 8 hours.   BUN 12 6 - 20 mg/dL   Creatinine, Ser 6.38 0.61 - 1.24 mg/dL   Calcium 9.1 8.9 - 93.7 mg/dL   GFR, Estimated >34 >28 mL/min    Comment: (NOTE) Calculated using the CKD-EPI Creatinine Equation (2021)    Anion gap 11 5 - 15    Comment: Performed at [facility], [address], [place], [place] [address]   No results found.  Review of Systems  All other systems reviewed and are negative.  Blood pressure (!) 145/93, pulse 74, temperature 98.7 F (37.1 C), temperature source Oral, resp. rate 17, height 5\' 8"  (1.727 m), weight 82.6 kg, SpO2 96 %. Physical Exam Constitutional:      Appearance: Normal appearance. He is normal weight.  HENT:     Head: Normocephalic and atraumatic.     Right Ear: External ear normal.     Left Ear: External ear normal.     Nose: Nose normal.     Mouth/Throat:     Mouth: Mucous membranes are moist.     Pharynx: Oropharynx is clear.  Eyes:     Extraocular Movements: Extraocular movements intact.     Conjunctiva/sclera: Conjunctivae normal.     Pupils: Pupils are equal, round, and reactive to light.  Neck:  Comments: Cuffless trach in place, very little air movement around tube when Passy-Muir valve in place. Cardiovascular:     Rate and Rhythm: Normal rate.  Pulmonary:     Effort: Pulmonary effort is normal.  Neurological:     General: No focal deficit present.     Mental  Status: He is alert and oriented to person, place, and time.  Psychiatric:        Mood and Affect: Mood normal.        Behavior: Behavior normal.        Thought Content: Thought content normal.        Judgment: Judgment normal.     Assessment/Plan Tracheostomy status and subglottic stenosis  To OR for SMDL with possible CO2 laser dilation.  Christia Reading, MD 02/06/2021, 1:32 PM

## 2021-02-06 NOTE — Brief Op Note (Signed)
02/06/2021  2:36 PM  PATIENT:  Charles Weeks  55 y.o. male  PRE-OPERATIVE DIAGNOSIS:  Tracheostomy status, Subglottic stenosis  POST-OPERATIVE DIAGNOSIS:  Tracheostomy status, tracheal stenosis  PROCEDURE:  Procedure(s): SUSPENDED MICRO DIRECT LARYNGOSCOPY (N/A) BALLOON DILATION (N/A)  SURGEON:  Surgeon(s) and Role:    Christia Reading, MD - Primary  PHYSICIAN ASSISTANT:   ASSISTANTS: none   ANESTHESIA:   general  EBL:  Minimal   BLOOD ADMINISTERED:none  DRAINS: none   LOCAL MEDICATIONS USED:  NONE  SPECIMEN:  No Specimen  DISPOSITION OF SPECIMEN:  N/A  COUNTS:  YES  TOURNIQUET:  * No tourniquets in log *  DICTATION: .Note written in EPIC  PLAN OF CARE: Discharge to home after PACU  PATIENT DISPOSITION:  PACU - hemodynamically stable.   Delay start of Pharmacological VTE agent (>24hrs) due to surgical blood loss or risk of bleeding: no

## 2021-02-06 NOTE — Anesthesia Procedure Notes (Signed)
Procedure Name: Intubation Date/Time: 02/06/2021 2:04 PM Performed by: Phoebe Perch, CRNA Pre-anesthesia Checklist: Patient identified, Emergency Drugs available, Suction available and Patient being monitored Patient Re-evaluated:Patient Re-evaluated prior to induction Oxygen Delivery Method: Circle system utilized Preoxygenation: Pre-oxygenation with 100% oxygen Induction Type: IV induction Ventilation: Mask ventilation without difficulty Tube type: Oral Number of attempts: 1 Airway Equipment and Method: Stylet and Oral airway Placement Confirmation: ETT inserted through vocal cords under direct vision, positive ETCO2 and breath sounds checked- equal and bilateral Tube secured with: Tape Dental Injury: Teeth and Oropharynx as per pre-operative assessment  Comments: Wire reinforced ETT 5.0 placed via trach

## 2021-02-06 NOTE — Progress Notes (Signed)
Pt is stable and maintaining his O2 sats on RM. Pt has been placed in phase 2, waiting for his sister to pick him up. Pt is in NAD, will continue to monitor until transportation arrives.

## 2021-02-06 NOTE — Op Note (Signed)
PREOPERATIVE DIAGNOSIS:  Tracheostomy status, subglottic stenosis   POSTOPERATIVE DIAGNOSIS:  Tracheostomy status, tracheal stenosis   PROCEDURE:  Suspended microdirect laryngoscopy with balloon dilation   SURGEON:  Melida Quitter, MD   ANESTHESIA:  General endotracheal anesthesia.   COMPLICATIONS:  None.   INDICATIONS:  The patient is a 55 year old male who fell two stories 08/17/20 and sustained multiple fractures.  As part of his care, he required tracheostomy and has been unable to tolerate Passy-Muir valve use.  He presents to the operating room for surgical management.   FINDINGS:  No laryngeal abnormality.  The trachea is puffy and narrowed around the tube at the trach site.   DESCRIPTION OF PROCEDURE:  The patient was identified in the holding room, informed consent having been obtained including discussion of risks, benefits and alternatives, the patient was brought to the operative suite and put the operative table in supine position.  Anesthesia was induced and the patient's trach tube was exchanged for a reinforced endotracheal tube.  The eyes were taped closed and bed was turned 90 degrees from anesthesia.  The patient was given intravenous steroids during the case.  A tooth guard was placed over the upper teeth and a Stortz laryngoscope was placed into the supraglottic position and suspended to Mayo stand using the Lewy arm.  Photodocumentation was performed with the zero degree telescope.  The endotracheal tube was removed for further visualization and then replaced.  The 15 mm tracheal balloon was then opened and placed at the tracheostomy site via the laryngoscope and with the endotracheal tube backed out.  The balloon was inflated to 7 atm for 60 seconds and then removed.  The endotracheal tube was replaced and the patient was ventilated.  The same was done a second time.  Photodocumentation was repeated.  The laryngoscope was then taking out of suspension and removed from the patient's  mouth while suctioning the airway.  The tooth guard was removed and the patient was turned back to anesthesia for wakeup.  When he was breathing on his own, a new cuffless #4 flexible trach was replaced.  He was taken to the recovery room in stable condition.

## 2021-02-07 ENCOUNTER — Encounter (HOSPITAL_COMMUNITY): Payer: Self-pay | Admitting: Otolaryngology

## 2021-02-07 NOTE — Anesthesia Postprocedure Evaluation (Signed)
Anesthesia Post Note  Patient: Ponce Skillman  Procedure(s) Performed: SUSPENDED MICRO DIRECT LARYNGOSCOPY (Throat) BALLOON DILATION (Throat)     Patient location during evaluation: PACU Anesthesia Type: General Level of consciousness: awake and alert Pain management: pain level controlled Vital Signs Assessment: post-procedure vital signs reviewed and stable Respiratory status: spontaneous breathing, nonlabored ventilation, respiratory function stable and patient connected to nasal cannula oxygen Cardiovascular status: blood pressure returned to baseline and stable Postop Assessment: no apparent nausea or vomiting Anesthetic complications: no   No notable events documented.              Shelton Silvas

## 2021-02-12 ENCOUNTER — Ambulatory Visit
Admission: RE | Admit: 2021-02-12 | Discharge: 2021-02-12 | Disposition: A | Discharge status: Home / Self Care | Payer: Medicaid Other | Source: Ambulatory Visit | Attending: Neurological Surgery | Admitting: Neurological Surgery

## 2021-02-12 DIAGNOSIS — S22068S Other fracture of T7-T8 thoracic vertebra, sequela: Secondary | ICD-10-CM

## 2021-02-13 ENCOUNTER — Telehealth (INDEPENDENT_AMBULATORY_CARE_PROVIDER_SITE_OTHER): Payer: Medicaid Other | Admitting: Licensed Clinical Social Worker

## 2021-02-13 ENCOUNTER — Other Ambulatory Visit: Payer: Self-pay

## 2021-02-13 DIAGNOSIS — F418 Other specified anxiety disorders: Secondary | ICD-10-CM

## 2021-02-14 ENCOUNTER — Other Ambulatory Visit: Payer: Self-pay | Admitting: Neurological Surgery

## 2021-02-14 DIAGNOSIS — S22068S Other fracture of T7-T8 thoracic vertebra, sequela: Secondary | ICD-10-CM

## 2021-02-15 NOTE — BH Specialist Note (Signed)
[facility] Follow Up Assessment  MRN: [id number] NAME: Charles Weeks Date: 02/15/21  Start time: 1230 End time: 1245 Total time: 15  Type of Contact: Follow up Call  Current concerns/stressors: not being able to get psychiatric medication  Screens/Assessment Tools: Unable to get a reading due to having a tracheotomy  Functional Assessment:  Sleep: fair Appetite: fair Coping ability: fair Patient taking medications as prescribed:  yes  Current medications:  Outpatient Encounter Medications as of 02/13/2021  Medication Sig   acetaminophen (TYLENOL) 325 MG tablet Take 2 tablets (650 mg total) by mouth every 6 (six) hours as needed for mild pain or moderate pain.   albuterol (PROAIR HFA) 108 (90 Base) MCG/ACT inhaler Inhale 1-2 puffs into the lungs every 4 (four) hours as needed for wheezing or shortness of breath.   albuterol (PROVENTIL) (2.5 MG/3ML) 0.083% nebulizer solution Take 3 mLs (2.5 mg total) by nebulization every 6 (six) hours as needed for wheezing or shortness of breath.   aspirin 81 MG chewable tablet Chew 1 tablet (81 mg total) by mouth daily.   bethanechol (URECHOLINE) 25 MG tablet TAKE TWO TABLETS BY MOUTH FOUR TIMES DAILY   budesonide-formoterol (SYMBICORT) 160-4.5 MCG/ACT inhaler Take 2 puffs first thing in am and then another 2 puffs about 12 hours later. (Patient taking differently: Inhale 2 puffs into the lungs in the morning and at bedtime. Take 2 puffs first thing in am and then another 2 puffs about 12 hours later.)   buPROPion (WELLBUTRIN XL) 300 MG 24 hr tablet TAKE 1 TABLET BY MOUTH ONCE DAILY.   busPIRone (BUSPAR) 10 MG tablet TAKE ONE TABLET BY MOUTH 3 TIMES A DAY   Calcium Carb-Cholecalciferol (CALCIUM 600/VITAMIN D) 600-400 MG-UNIT CHEW Chew 1 each by mouth daily.   Cholecalciferol (VITAMIN D3) 125 MCG (5000 UT) CAPS Take 5,000 Units by mouth daily.   clonazePAM (KLONOPIN) 0.5 MG tablet TAKE (1) TABLET BY MOUTH AT BEDTIME.   docusate sodium  (COLACE) 100 MG capsule Take 100 mg by mouth daily as needed for moderate constipation.   folic acid (FOLVITE) 1 MG tablet TAKE ONE TABLET BY MOUTH ONCE DAILY.   mometasone-formoterol (DULERA) 200-5 MCG/ACT AERO Inhale 2 puffs into the lungs 2 (two) times daily.   montelukast (SINGULAIR) 10 MG tablet TAKE ONE TABLET BY MOUTH AT BEDTIME   nitroGLYCERIN (NITROSTAT) 0.4 MG SL tablet Place 0.4 mg under the tongue every 5 (five) minutes as needed for chest pain.   oxyCODONE (OXY IR/ROXICODONE) 5 MG immediate release tablet Take 1 tablet (5 mg total) by mouth every 6 (six) hours as needed for moderate pain.   OXYGEN Inhale 5 L into the lungs continuous.   pantoprazole (PROTONIX) 40 MG tablet TAKE ONE TABLET BY MOUTH ONCE DAILY.   predniSONE (DELTASONE) 5 MG tablet TAKE 1 TABLET BY MOUTH ONCE A DAY WITH BREAKFAST.   QUEtiapine (SEROQUEL) 300 MG tablet Take 1/2 Tablet twice a day and one tablet at bedtime (Patient taking differently: Take 150-300 mg by mouth See admin instructions. Take 1/2 Tablet (150 mg) twice a day and one tablet (300 mg) at bedtime)   rosuvastatin (CRESTOR) 5 MG tablet Take 1 tablet (5 mg total) by mouth daily. (Patient taking differently: Take 5 mg by mouth at bedtime.)   scopolamine (TRANSDERM-SCOP) 1 MG/3DAYS Place 1 patch (1.5 mg total) onto the skin every 3 (three) days.   tamsulosin (FLOMAX) 0.4 MG CAPS capsule TAKE (1) CAPSULE BY MOUTH TWICE DAILY.   terazosin (HYTRIN) 2  MG capsule TAKE ONE CAPSULE BY MOUTH ONCE DAILY   No facility-administered encounter medications on file as of 02/13/2021.    Self-harm and/or Suicidal Behaviors Risk Assessment Self-harm risk factors: no Patient endorses recent self injurious thoughts and/or behaviors: No   Suicide ideations: No plan to harm self or others   Danger to Others Risk Assessment Danger to others risk factors: no Patient endorses recent thoughts of harming others: No    Substance Use Assessment Patient recently consumed  alcohol: No  Patient recently used drugs: No  Patient is concerned about dependence or abuse of substances: No    Goals, Interventions and Follow-up Plan Goals: Increase healthy adjustment to current life circumstances Interventions: Solution-Focused Strategies   Summary of Clinical Assessment  Charles Weeks was unable to communicate with Writer therefore his sister was available for session.  Sister reports that Patient is unable to get his medication and she attempted to attend [facility] as suggested but was unsuccessful. She is requesting medication management and states that PCP will not due to needing Psychiatry.  Writer referred Patient to [facility] in [place] for med management. Writer will follow up with Patient to ensure referral was successful   Follow-up Plan:  refer to traditional therapy Marinda Elk, LCSW

## 2021-02-20 ENCOUNTER — Ambulatory Visit: Payer: Medicaid Other

## 2021-02-20 ENCOUNTER — Telehealth (INDEPENDENT_AMBULATORY_CARE_PROVIDER_SITE_OTHER): Payer: Medicaid Other | Admitting: Licensed Clinical Social Worker

## 2021-02-20 DIAGNOSIS — F418 Other specified anxiety disorders: Secondary | ICD-10-CM

## 2021-02-20 NOTE — BH Specialist Note (Signed)
Virtual Behavioral Health Treatment Plan Team Note  MRN: [id number] NAME: Charles Weeks  DATE: 02/20/21  Start time:   End time:   Total time:  5 min  Total number of Virtual BH Treatment Team Plan encounters: 2/4  Treatment Team Attendees: Nolon Rod LCSW & Dr. Vanetta Shawl  Diagnoses: No diagnosis found.  Goals, Interventions and Follow-up Plan Goals: Increase healthy adjustment to current life circumstances Interventions: Solution-Focused Strategies Medication Management Recommendations: referral to [facility] and then we will sign off Follow-up Plan: refer to traditional therapy  History of the present illness Presenting Problem/Current Symptoms: continued symptoms of DX  Psychiatric History  Depression: No Anxiety: Yes Mania: No Psychosis: No PTSD symptoms: No  Past Psychiatric History/Hospitalization(s): Hospitalization for psychiatric illness: No Prior Suicide Attempts: No Prior Self-injurious behavior: No     Self-harm Behaviors Risk Assessment   Screenings PHQ-9 Assessments:  Depression screen [facility] 2/9 02/05/2021 12/14/2020 11/15/2020  Decreased Interest 0 0 1  Down, Depressed, Hopeless 1 0 2  PHQ - 2 Score 1 0 3  Altered sleeping 1 0 0  Tired, decreased energy 1 0 3  Change in appetite 0 0 0  Feeling bad or failure about yourself  0 0 0  Trouble concentrating 0 0 0  Moving slowly or fidgety/restless 0 0 0  Suicidal thoughts 0 0 0  PHQ-9 Score 3 0 6  Difficult doing work/chores Not difficult at all Not difficult at all -   GAD-7 Assessments:  GAD 7 : Generalized Anxiety Score 03/06/2020 01/19/2020 12/08/2019  Nervous, Anxious, on Edge 0 1 1  Control/stop worrying 1 1 0  Worry too much - different things 1 1 0  Trouble relaxing 3 1 1   Restless 1 1 1   Easily annoyed or irritable - 1 0  Afraid - awful might happen 1 0 0  Total GAD 7 Score - 6 3  Anxiety Difficulty Not difficult at all Somewhat difficult Somewhat difficult    Past Medical History Past  Medical History:  Diagnosis Date   Anxiety 01/19/2020   Arthritis    Asthma    Benign prostatic hyperplasia with weak urinary stream 12/08/2019   BPH (benign prostatic hyperplasia) 08/17/2020   Chronic respiratory failure with hypoxia (HCC) 01/24/2020   Has home 02 but not using as of 01/24/2020  -  01/24/2020   Walked RA  approx   200 ft  @ slow pace  stopped due to  Dizzy with sats still 95%      Cigarette smoker 01/24/2020   Stopped regular smoking 10/2019 but still "now and then" as of 01/24/2020    COPD (chronic obstructive pulmonary disease) (HCC) 08/17/2020   gold   COPD GOLD ?     Quit smoking 10/2019  - Labs ordered 01/24/2020  :  allergy profile   alpha one AT phenotype   - 01/24/2020  After extensive coaching inhaler device,  effectiveness =    75% (short Ti) > change symb 80 to 160 2bid      Coronary artery disease 06/08/2018   DES to RCA, [facility])   Depression, major, single episode, moderate (HCC) 01/19/2020   Dyspnea    Encounter for screening for malignant neoplasm of colon 12/08/2019   Essential hypertension 12/08/2019   History of MI (myocardial infarction) 01/19/2020   HTN (hypertension) 08/17/2020   Myocardial infarct (HCC) 2019   Pneumonia     Vital signs: There were no vitals filed for this visit.  Allergies:  Allergies  as of 02/20/2021   (No Known Allergies)    Medication History Current medications:  Outpatient Encounter Medications as of 02/20/2021  Medication Sig   acetaminophen (TYLENOL) 325 MG tablet Take 2 tablets (650 mg total) by mouth every 6 (six) hours as needed for mild pain or moderate pain.   albuterol (PROAIR HFA) 108 (90 Base) MCG/ACT inhaler Inhale 1-2 puffs into the lungs every 4 (four) hours as needed for wheezing or shortness of breath.   albuterol (PROVENTIL) (2.5 MG/3ML) 0.083% nebulizer solution Take 3 mLs (2.5 mg total) by nebulization every 6 (six) hours as needed for wheezing or shortness of breath.   aspirin 81  MG chewable tablet Chew 1 tablet (81 mg total) by mouth daily.   bethanechol (URECHOLINE) 25 MG tablet TAKE TWO TABLETS BY MOUTH FOUR TIMES DAILY   budesonide-formoterol (SYMBICORT) 160-4.5 MCG/ACT inhaler Take 2 puffs first thing in am and then another 2 puffs about 12 hours later. (Patient taking differently: Inhale 2 puffs into the lungs in the morning and at bedtime. Take 2 puffs first thing in am and then another 2 puffs about 12 hours later.)   buPROPion (WELLBUTRIN XL) 300 MG 24 hr tablet TAKE 1 TABLET BY MOUTH ONCE DAILY.   busPIRone (BUSPAR) 10 MG tablet TAKE ONE TABLET BY MOUTH 3 TIMES A DAY   Calcium Carb-Cholecalciferol (CALCIUM 600/VITAMIN D) 600-400 MG-UNIT CHEW Chew 1 each by mouth daily.   Cholecalciferol (VITAMIN D3) 125 MCG (5000 UT) CAPS Take 5,000 Units by mouth daily.   clonazePAM (KLONOPIN) 0.5 MG tablet TAKE (1) TABLET BY MOUTH AT BEDTIME.   docusate sodium (COLACE) 100 MG capsule Take 100 mg by mouth daily as needed for moderate constipation.   folic acid (FOLVITE) 1 MG tablet TAKE ONE TABLET BY MOUTH ONCE DAILY.   mometasone-formoterol (DULERA) 200-5 MCG/ACT AERO Inhale 2 puffs into the lungs 2 (two) times daily.   montelukast (SINGULAIR) 10 MG tablet TAKE ONE TABLET BY MOUTH AT BEDTIME   nitroGLYCERIN (NITROSTAT) 0.4 MG SL tablet Place 0.4 mg under the tongue every 5 (five) minutes as needed for chest pain.   oxyCODONE (OXY IR/ROXICODONE) 5 MG immediate release tablet Take 1 tablet (5 mg total) by mouth every 6 (six) hours as needed for moderate pain.   OXYGEN Inhale 5 L into the lungs continuous.   pantoprazole (PROTONIX) 40 MG tablet TAKE ONE TABLET BY MOUTH ONCE DAILY.   predniSONE (DELTASONE) 5 MG tablet TAKE 1 TABLET BY MOUTH ONCE A DAY WITH BREAKFAST.   QUEtiapine (SEROQUEL) 300 MG tablet Take 1/2 Tablet twice a day and one tablet at bedtime (Patient taking differently: Take 150-300 mg by mouth See admin instructions. Take 1/2 Tablet (150 mg) twice a day and one  tablet (300 mg) at bedtime)   rosuvastatin (CRESTOR) 5 MG tablet Take 1 tablet (5 mg total) by mouth daily. (Patient taking differently: Take 5 mg by mouth at bedtime.)   scopolamine (TRANSDERM-SCOP) 1 MG/3DAYS Place 1 patch (1.5 mg total) onto the skin every 3 (three) days.   tamsulosin (FLOMAX) 0.4 MG CAPS capsule TAKE (1) CAPSULE BY MOUTH TWICE DAILY.   terazosin (HYTRIN) 2 MG capsule TAKE ONE CAPSULE BY MOUTH ONCE DAILY   [DISCONTINUED] glycopyrrolate (ROBINUL) 1 MG tablet Take 1 tablet (1 mg total) by mouth 3 (three) times daily.   No facility-administered encounter medications on file as of 02/20/2021.     Scribe for Treatment Team: Marinda Elk, LCSW

## 2021-02-22 ENCOUNTER — Telehealth: Payer: Self-pay

## 2021-02-22 DIAGNOSIS — S22000G Wedge compression fracture of unspecified thoracic vertebra, subsequent encounter for fracture with delayed healing: Secondary | ICD-10-CM

## 2021-02-22 DIAGNOSIS — G894 Chronic pain syndrome: Secondary | ICD-10-CM

## 2021-02-22 MED ORDER — OXYCODONE HCL 5 MG PO TABS: 5.0000 mg | ORAL_TABLET | Freq: Four times a day (QID) | ORAL | 0 refills | Status: DC | PRN

## 2021-02-22 NOTE — Telephone Encounter (Signed)
Refill request for Oxycodone, last filled 01/23/21 #100. Next appt 04/10/2021. I called pharmacy and they don't have a prescription on file. [facility]

## 2021-02-22 NOTE — Telephone Encounter (Signed)
Refilled oxycodone.

## 2021-02-25 ENCOUNTER — Other Ambulatory Visit: Payer: Self-pay | Admitting: Internal Medicine

## 2021-02-25 ENCOUNTER — Other Ambulatory Visit: Payer: Self-pay | Admitting: Registered Nurse

## 2021-02-28 ENCOUNTER — Other Ambulatory Visit: Payer: Self-pay

## 2021-02-28 ENCOUNTER — Ambulatory Visit (INDEPENDENT_AMBULATORY_CARE_PROVIDER_SITE_OTHER): Payer: Medicaid Other | Admitting: Licensed Clinical Social Worker

## 2021-02-28 DIAGNOSIS — F418 Other specified anxiety disorders: Secondary | ICD-10-CM

## 2021-02-28 NOTE — Progress Notes (Signed)
Referral to [facility] for medication management

## 2021-03-08 ENCOUNTER — Ambulatory Visit
Admission: RE | Admit: 2021-03-08 | Discharge: 2021-03-08 | Disposition: A | Discharge status: Home / Self Care | Payer: Medicaid Other | Source: Ambulatory Visit | Attending: Neurological Surgery | Admitting: Neurological Surgery

## 2021-03-08 DIAGNOSIS — S22068S Other fracture of T7-T8 thoracic vertebra, sequela: Secondary | ICD-10-CM

## 2021-03-21 ENCOUNTER — Other Ambulatory Visit: Payer: Self-pay | Admitting: Internal Medicine

## 2021-03-21 ENCOUNTER — Telehealth: Payer: Medicaid Other

## 2021-03-25 ENCOUNTER — Other Ambulatory Visit: Payer: Self-pay | Admitting: Physical Medicine & Rehabilitation

## 2021-03-25 ENCOUNTER — Telehealth: Payer: Self-pay

## 2021-03-25 DIAGNOSIS — S22000G Wedge compression fracture of unspecified thoracic vertebra, subsequent encounter for fracture with delayed healing: Secondary | ICD-10-CM

## 2021-03-25 DIAGNOSIS — G894 Chronic pain syndrome: Secondary | ICD-10-CM

## 2021-03-25 NOTE — Telephone Encounter (Signed)
Patient's sister is calling to request a refill of Oxycodone. She states it runs out today. Last fill 02/22/21.

## 2021-03-26 ENCOUNTER — Telehealth: Payer: Self-pay | Admitting: *Deleted

## 2021-03-26 MED ORDER — OXYCODONE HCL 5 MG PO TABS: 5.0000 mg | ORAL_TABLET | Freq: Four times a day (QID) | ORAL | 0 refills | Status: DC | PRN

## 2021-03-26 NOTE — Telephone Encounter (Signed)
Charles Weeks, [name]'s sister called again for a refill on his oxycodone. (She called 3 x yesterday) His last fill date was 02/22/21. His next appt is 04/10/21.

## 2021-03-26 NOTE — Telephone Encounter (Signed)
Rx sent.  Needs CSA at next visit

## 2021-03-26 NOTE — Addendum Note (Signed)
Addended by: Faith Rogue T on: 03/26/2021 11:22 AM   Modules accepted: Orders

## 2021-03-27 ENCOUNTER — Other Ambulatory Visit: Payer: Medicaid Other

## 2021-04-01 ENCOUNTER — Ambulatory Visit
Admission: RE | Admit: 2021-04-01 | Discharge: 2021-04-01 | Disposition: A | Discharge status: Home / Self Care | Payer: Medicaid Other | Source: Ambulatory Visit | Attending: Neurological Surgery | Admitting: Neurological Surgery

## 2021-04-01 ENCOUNTER — Other Ambulatory Visit: Payer: Self-pay

## 2021-04-10 ENCOUNTER — Other Ambulatory Visit: Payer: Self-pay

## 2021-04-10 ENCOUNTER — Encounter: Payer: Self-pay | Admitting: Physical Medicine & Rehabilitation

## 2021-04-10 ENCOUNTER — Encounter: Payer: Medicaid Other | Attending: Registered Nurse | Admitting: Physical Medicine & Rehabilitation

## 2021-04-10 VITALS — BP 131/79 | HR 109 | Temp 100.2°F | Resp 24 | Ht 68.0 in | Wt 181.0 lb

## 2021-04-10 DIAGNOSIS — R338 Other retention of urine: Secondary | ICD-10-CM | POA: Diagnosis not present

## 2021-04-10 DIAGNOSIS — N401 Enlarged prostate with lower urinary tract symptoms: Secondary | ICD-10-CM | POA: Insufficient documentation

## 2021-04-10 DIAGNOSIS — S22000G Wedge compression fracture of unspecified thoracic vertebra, subsequent encounter for fracture with delayed healing: Secondary | ICD-10-CM | POA: Insufficient documentation

## 2021-04-10 DIAGNOSIS — G894 Chronic pain syndrome: Secondary | ICD-10-CM | POA: Diagnosis not present

## 2021-04-10 MED ORDER — TERAZOSIN HCL 5 MG PO CAPS: 5.0000 mg | ORAL_CAPSULE | Freq: Every day | ORAL | 5 refills | Status: DC

## 2021-04-10 MED ORDER — OXYCODONE HCL 5 MG PO TABS: 5.0000 mg | ORAL_TABLET | Freq: Four times a day (QID) | ORAL | 0 refills | Status: DC | PRN

## 2021-04-10 NOTE — Progress Notes (Signed)
Subjective:    Patient ID: Charles Weeks, male    DOB: Mar 19, 1966, 55 y.o.   MRN: [id number]  HPI   Charles Weeks is here in follow up of his chronic pain and mobility issues. 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 He has his trach and is followed by ENT and pulmonary. He migh need it long term. His sats were low into the 80's when he came into the office today. He is on continuous oxygen and has required more of later per sister. Sister provides all trach care. His back continues to be problematic. Recent mri shoes further compressive diseaes of his thoracic spine. He has NS f/u upcoming. He is using 3 oxycodones most days but frequently needs more for quality of life. He is moving bowels regularly. Sometimes urinary flow remains a problem.     Pain Inventory Average Pain 8 Pain Right Now 9 My pain is constant, sharp, burning, dull, stabbing, tingling, and aching  In the last 24 hours, has pain interfered with the following? General activity 10 Relation with others 0 Enjoyment of life 6 What TIME of day is your pain at its worst? morning  and night Sleep (in general) Fair  Pain is worse with: walking, bending, sitting, standing, and some activites Pain improves with: medication Relief from Meds: 4  Family History  Problem Relation Age of Onset   Heart disease Mother    Heart disease Father    Diabetes Sister    Social History   Socioeconomic History   Marital status: Single    Spouse name: Not on file   Number of children: 2   Years of education: Not on file   Highest education level: 7th grade  Occupational History   Not on file  Tobacco Use   Smoking status: Former    Packs/day: 1.50    Types: Cigarettes    Quit date: 08/17/2020    Years since quitting: 0.6   Smokeless tobacco: Never  Vaping Use   Vaping Use: Never used  Substance and Sexual Activity   Alcohol use: Not Currently   Drug use: Not Currently   Sexual activity: Not on file  Other Topics  Concern   Not on file  Social History Narrative   ** Merged History Encounter **       Lives with sister  Both daughters live close by   Sister has dog   Enjoys: shooting pool, fishing, Therapist, nutritional  Diet: eats all food groups Caffeine: pop-dr pepper 2 liter  Water: 4-6 cups   Wears seat belt  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 No weapons        Social Determinants of Health   Financial Resource Strain: Not on file  Food Insecurity: Not on file  Transportation Needs: Not on file  Physical Activity: Not on file  Stress: Not on file  Social Connections: Not on file   Past Surgical History:  Procedure Laterality Date   BALLOON DILATION N/A 02/06/2021   Procedure: BALLOON DILATION;  Surgeon: Christia Reading, MD;  Location: [facility] OR;  Service: ENT;  Laterality: N/A;   BOWEL RESECTION  2006   MICROLARYNGOSCOPY WITH LASER AND BALLOON DILATION N/A 02/06/2021   Procedure: SUSPENDED MICRO DIRECT LARYNGOSCOPY;  Surgeon: Christia Reading, MD;  Location: [facility] OR;  Service: ENT;  Laterality: N/A;   PEG PLACEMENT N/A 09/03/2020   Procedure: PERCUTANEOUS ENDOSCOPIC GASTROSTOMY (PEG) PLACEMENT;  Surgeon: Violeta Gelinas, MD;  Location: [facility] OR;  Service: General;  Laterality: N/A;   PERCUTANEOUS TRACHEOSTOMY N/A 09/03/2020   Procedure: PERCUTANEOUS TRACHEOSTOMY USING 6 SHILEY;  Surgeon: Georganna Skeans, MD;  Location: [place];  Service: General;  Laterality: N/A;   SKIN GRAFT     TONSILLECTOMY AND ADENOIDECTOMY     Past Surgical History:  Procedure Laterality Date   BALLOON DILATION N/A 02/06/2021   Procedure: BALLOON DILATION;  Surgeon: Melida Quitter, MD;  Location: [place];  Service: ENT;  Laterality: N/A;   BOWEL RESECTION  2006   MICROLARYNGOSCOPY WITH LASER AND BALLOON DILATION N/A 02/06/2021   Procedure: SUSPENDED MICRO DIRECT LARYNGOSCOPY;  Surgeon: Melida Quitter, MD;  Location: [place];  Service: ENT;  Laterality: N/A;   PEG PLACEMENT N/A 09/03/2020   Procedure: PERCUTANEOUS  ENDOSCOPIC GASTROSTOMY (PEG) PLACEMENT;  Surgeon: Georganna Skeans, MD;  Location: [place];  Service: General;  Laterality: N/A;   PERCUTANEOUS TRACHEOSTOMY N/A 09/03/2020   Procedure: PERCUTANEOUS TRACHEOSTOMY USING 6 SHILEY;  Surgeon: Georganna Skeans, MD;  Location: [place];  Service: General;  Laterality: N/A;   SKIN GRAFT     TONSILLECTOMY AND ADENOIDECTOMY     Past Medical History:  Diagnosis Date   Anxiety 01/19/2020   Arthritis    Asthma    Benign prostatic hyperplasia with weak urinary stream 12/08/2019   BPH (benign prostatic hyperplasia) 08/17/2020   Chronic respiratory failure with hypoxia ([place]) 01/24/2020   Has home 02 but not using as of 01/24/2020  -  01/24/2020   Walked RA  approx   200 ft  @ slow pace  stopped due to  Dizzy with sats still 95%      Cigarette smoker 01/24/2020   Stopped regular smoking 10/2019 but still "now and then" as of 01/24/2020    COPD (chronic obstructive pulmonary disease) ([place]) 08/17/2020   gold   COPD GOLD ?     Quit smoking 10/2019  - Labs ordered 01/24/2020  :  allergy profile   alpha one AT phenotype   - 01/24/2020  After extensive coaching inhaler device,  effectiveness =    75% (short Ti) > change symb 80 to 160 2bid      Coronary artery disease 06/08/2018   DES to RCA, [facility] [place])   Depression, major, single episode, moderate ([place]) 01/19/2020   Dyspnea    Encounter for screening for malignant neoplasm of colon 12/08/2019   Essential hypertension 12/08/2019   History of MI (myocardial infarction) 01/19/2020   HTN (hypertension) 08/17/2020   Myocardial infarct ([place]) 2019   Pneumonia    BP (!) 144/92   Pulse (!) 109   Temp 100.2 F (37.9 C)   Ht 5\' 8"  (1.727 m)   Wt 181 lb (82.1 kg)   BMI 27.52 kg/m   Opioid Risk Score:   Fall Risk Score:  `1  Depression screen PHQ 2/9  Depression screen [facility] 2/9 04/10/2021 02/05/2021 12/14/2020 11/15/2020 11/05/2020 04/16/2020 03/06/2020  Decreased Interest 1 0 0 1 0 2 1  Down,  Depressed, Hopeless 1 1 0 2 3 2 1   PHQ - 2 Score 2 1 0 3 3 4 2   Altered sleeping - 1 0 0 0 2 0  Tired, decreased energy - 1 0 3 3 2 3   Change in appetite - 0 0 0 0 1 0  Feeling bad or failure about yourself  - 0 0 0 0 0 1  Trouble concentrating - 0 0 0 0 0 1  Moving slowly or fidgety/restless - 0  0 0 0 0 1  Suicidal thoughts - 0 0 0 3 0 0  PHQ-9 Score - 3 0 6 9 9 8   Difficult doing work/chores - Not difficult at all Not difficult at all - Very difficult - Not difficult at all    Review of Systems  Musculoskeletal:  Positive for arthralgias, back pain and gait problem.  All other systems reviewed and are negative.     Objective:   Physical Exam  General: No acute distress, HEENT: NCAT, EOMI, oral membranes moist Cards: reg rate  Chest: works a little to breathe, stridor  Abdomen: Soft, NT, ND Skin: dry, intact Extremities: no edema Psych: pleasant and appropriate  Skin: intact Neuro: Neurologically appears to be alert and oriented x3.  Poor phonation as noted above.  Strength is grossly 4+ to 5 out of 5 in all 4 limbs with no focal sensory findings.  Balance was fair to good.  was in transport chair Musculoskeletal: mid back discomfort           Assessment & Plan:  1.  Debility secondary to fall 08/17/20 with 3 column T8 vertebral body fracture/T7 neural arch fracture/right TVP T4-8/right C7 transverse process fracture through the foramen.   progressive disease on recent mri             -continue brace per neurosurgery--f/u scheduled             -maintain HEP as much as he can  2.  . Pain Management: Continue Oxycodone 5mg  q6 prn, will increase to #120 to allow him 4 per day.              -May take up to 2000 mg of Tylenol on a daily basis and 3000 occasionally.             - ibuprofen 800 mg twice daily as needed with food             -mild radiculopathy?. stable             -We will continue the controlled substance monitoring program, this consists of regular clinic  visits, examinations, routine drug screening, pill counts as well as use of 10/17/20 Controlled Substance Reporting System. [place].                              4. Mood: Continue Wellbutrin 300 mg daily, BuSpar 10 mg 3 times daily, Klonopin 0.5 mg nightly,              -Seroquel 150 mg twice daily and 300 mg nightly              -fairly stable for now.    5.  CAD/MI/DES.  Continue aspirin per cardiology.   6.  Acute hypercarbic ventilatory dependent respiratory failure/COPD.  Tracheostomy 09/03/2020 per Dr. [place].   -ENT/pumonary  following.                   -  7.  BPH/urine retention:              -oob to void for all attempts              -Continue hyrtin, Flomax and Urecholine.  Will increase hytrin to 4mg  which should help with bp too.  8 Constipation---              softener prn   15  minutes of  face to face patient care time were spent during this visit. All questions were encouraged and answered. Follow up with me in 2 months with NP.

## 2021-04-10 NOTE — Patient Instructions (Signed)
PLEASE FEEL FREE TO CALL OUR OFFICE WITH ANY PROBLEMS OR QUESTIONS ([phone])      

## 2021-04-18 ENCOUNTER — Telehealth: Payer: Self-pay | Admitting: Cardiology

## 2021-04-18 ENCOUNTER — Other Ambulatory Visit: Payer: Self-pay

## 2021-04-18 ENCOUNTER — Encounter (HOSPITAL_COMMUNITY): Payer: Self-pay | Admitting: Speech Pathology

## 2021-04-18 DIAGNOSIS — I2510 Atherosclerotic heart disease of native coronary artery without angina pectoris: Secondary | ICD-10-CM

## 2021-04-18 NOTE — Telephone Encounter (Signed)
Lab already ordered for lipids at [facility], sister aware.

## 2021-04-18 NOTE — Telephone Encounter (Signed)
Pt's sister [name] would like to know if Dr. Diona Browner would like to the pt to have lab work and if so, can he go ahead and put the order in so she can make sure she brings him in.

## 2021-04-18 NOTE — Therapy (Signed)
[place], [place], [address] Phone: [phone]   Fax:  [phone]  Patient Details  Name: Charles Weeks MRN: [id number] Date of Birth: 08-26-1965 Referring Provider:  No ref. provider found  Encounter Date: 04/18/2021  SPEECH THERAPY DISCHARGE SUMMARY  Visits from Start of Care: 1  Current functional level related to goals / functional outcomes: Did not return since evaluation   Remaining deficits: N/A   Education / Equipment:    Patient agrees to discharge. Patient goals were not met. Patient is being discharged due to not returning since the last visit.Charles Weeks,  Charles Weeks, [name]  [facility] 04/18/2021, 3:11 PM  [place] [address] [place], [place], [address] Phone: [phone]   Fax:  [phone]

## 2021-04-22 ENCOUNTER — Other Ambulatory Visit: Payer: Self-pay | Admitting: Internal Medicine

## 2021-04-22 ENCOUNTER — Other Ambulatory Visit: Payer: Self-pay | Admitting: Family Medicine

## 2021-04-23 ENCOUNTER — Other Ambulatory Visit: Payer: Self-pay

## 2021-04-23 ENCOUNTER — Other Ambulatory Visit (HOSPITAL_COMMUNITY)
Admission: RE | Admit: 2021-04-23 | Discharge: 2021-04-23 | Disposition: A | Discharge status: Home / Self Care | Payer: Medicaid Other | Source: Ambulatory Visit | Attending: Physician Assistant | Admitting: Physician Assistant

## 2021-04-23 DIAGNOSIS — I2510 Atherosclerotic heart disease of native coronary artery without angina pectoris: Secondary | ICD-10-CM | POA: Diagnosis present

## 2021-04-23 LAB — LIPID PANEL
Cholesterol: 130 mg/dL (ref 0–200)
HDL: 53 mg/dL (ref >40)
LDL Cholesterol: 50 mg/dL (ref 0–99)
Total CHOL/HDL Ratio: 2.5 RATIO
Triglycerides: 135 mg/dL (ref <150)
VLDL: 27 mg/dL (ref 0–40)

## 2021-04-29 ENCOUNTER — Other Ambulatory Visit: Payer: Self-pay | Admitting: Internal Medicine

## 2021-04-29 ENCOUNTER — Other Ambulatory Visit: Payer: Self-pay | Admitting: Family Medicine

## 2021-05-14 ENCOUNTER — Telehealth: Payer: Self-pay | Admitting: *Deleted

## 2021-05-14 DIAGNOSIS — G894 Chronic pain syndrome: Secondary | ICD-10-CM

## 2021-05-14 DIAGNOSIS — S22000G Wedge compression fracture of unspecified thoracic vertebra, subsequent encounter for fracture with delayed healing: Secondary | ICD-10-CM

## 2021-05-14 MED ORDER — OXYCODONE HCL 5 MG PO TABS: 5.0000 mg | ORAL_TABLET | Freq: Four times a day (QID) | ORAL | 0 refills | Status: DC | PRN

## 2021-05-14 NOTE — Telephone Encounter (Signed)
Charles Weeks is due to be out of his medication 05/17/21 and Charles Weeks is a holiday. They would like to be able to get it filled before the weekend. Oxydocone 5 mg last filled 04/18/21 per pmp.

## 2021-05-14 NOTE — Telephone Encounter (Signed)
RX written to be filled on 05/16/21

## 2021-05-15 NOTE — Telephone Encounter (Signed)
Notified. 

## 2021-05-17 ENCOUNTER — Telehealth: Payer: Self-pay | Admitting: Internal Medicine

## 2021-05-17 NOTE — Telephone Encounter (Signed)
PCS FORM  NOTED  COPIED SLEEVED

## 2021-05-23 NOTE — Telephone Encounter (Signed)
Forms picked up

## 2021-05-27 ENCOUNTER — Other Ambulatory Visit: Payer: Self-pay | Admitting: Physical Medicine & Rehabilitation

## 2021-05-27 ENCOUNTER — Other Ambulatory Visit: Payer: Self-pay | Admitting: Internal Medicine

## 2021-06-13 ENCOUNTER — Telehealth: Payer: Self-pay

## 2021-06-13 DIAGNOSIS — G894 Chronic pain syndrome: Secondary | ICD-10-CM

## 2021-06-13 DIAGNOSIS — S22000G Wedge compression fracture of unspecified thoracic vertebra, subsequent encounter for fracture with delayed healing: Secondary | ICD-10-CM

## 2021-06-13 MED ORDER — OXYCODONE HCL 5 MG PO TABS: 5.0000 mg | ORAL_TABLET | ORAL | 0 refills | Status: DC | PRN

## 2021-06-13 NOTE — Telephone Encounter (Signed)
Charles Weeks is due to be out of his medication 06/16/21 and its a Sunday . They would like to be able to get it filled before the weekend. Oxydocone 5 mg last filled 05/16/21 per pmp. Next appointment is Feb 15th.

## 2021-06-13 NOTE — Telephone Encounter (Signed)
[name] notified

## 2021-06-13 NOTE — Telephone Encounter (Signed)
We cannot continue to fill this early. I changed the sig to "q4 hours prn" so that we can do it this time. I will not fill early again in the future, nor will pharmacy continue to allow that.

## 2021-06-14 ENCOUNTER — Telehealth: Payer: Self-pay | Admitting: Physical Medicine & Rehabilitation

## 2021-06-14 ENCOUNTER — Telehealth: Payer: Self-pay

## 2021-06-14 NOTE — Telephone Encounter (Signed)
error 

## 2021-06-14 NOTE — Telephone Encounter (Signed)
PA for Oxycodone sent to [facility]

## 2021-06-19 ENCOUNTER — Encounter: Payer: Self-pay | Admitting: Internal Medicine

## 2021-06-19 ENCOUNTER — Ambulatory Visit (INDEPENDENT_AMBULATORY_CARE_PROVIDER_SITE_OTHER): Payer: Medicaid Other | Admitting: Internal Medicine

## 2021-06-19 ENCOUNTER — Other Ambulatory Visit: Payer: Self-pay

## 2021-06-19 DIAGNOSIS — J449 Chronic obstructive pulmonary disease, unspecified: Secondary | ICD-10-CM

## 2021-06-19 DIAGNOSIS — J9611 Chronic respiratory failure with hypoxia: Secondary | ICD-10-CM | POA: Diagnosis not present

## 2021-06-19 NOTE — Patient Instructions (Signed)
Stop dulera   Plan A = Automatic = Always=    symbicort 160 Take 2 puffs first thing in am and then another 2 puffs about 12 hours later.     Plan B = Backup (to supplement plan A, not to replace it) Only use your albuterol inhaler as a rescue medication to be used if you can't catch your breath by resting or doing a relaxed purse lip breathing pattern.  - The less you use it, the better it will work when you need it. - Ok to use the inhaler up to 2 puffs  every 4 hours if you must but call for appointment if use goes up over your usual need - Don't leave home without it !!  (think of it like the spare tire for your car)   Plan C = Crisis (instead of Plan B but only if Plan B stops working) - only use your albuterol nebulizer if you first try Plan B and it fails to help > ok to use the nebulizer up to every 4 hours but if start needing it regularly call for immediate appointment     Please schedule a follow up visit in 6 months but call sooner if needed

## 2021-06-19 NOTE — Assessment & Plan Note (Signed)
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  No change in noct 02 per trach collar.         Each maintenance medication was reviewed in detail including emphasizing most importantly the difference between maintenance and prns and under what circumstances the prns are to be triggered using an action plan format where appropriate.  Total time for H and P, chart review, counseling, reviewing hfa/neb/02 device(s) and generating customized AVS unique to this office visit / same day charting = 25 min

## 2021-06-19 NOTE — Assessment & Plan Note (Signed)
Quit smoking 08/2020/ MM - 01/24/2020  After extensive coaching inhaler device,  effectiveness =    75% (short Ti) > change symb 80 to 160 2bid  -  PFT's   05/01/20   FEV1 0.97 (27 % ) ratio 0.30  p 1 % improvement from saba p symbicort prior to study with DLCO  11.59 (43%) corrects to 2.0 (45%)  for alv volume and FV curve severe airflow obst with 35% improvement in FVC p saba  - 05/31/2020  After extensive coaching inhaler device,  effectiveness =    Short Ti, prostate problems so rx symbicort 160 2bid and prn saba -  05/31/2020   Walked RA  approx   300 ft  @ moderate pace  stopped due to  Sob at end but sats still 94%    - Labs ordered 12/24/2020  :    alpha one AT phenotype  MM level 154   Doing better on laba/ics at this point but needs to use his meds more effectively > ABC action plan reviewed see avs for instructions unique to this ov

## 2021-06-19 NOTE — Progress Notes (Signed)
Charles Weeks, male    DOB: [birth year],    MRN: [id number]   Brief patient profile:  10  yowm illiterate though says finished 7th grade with asthma since in his 35s  just MM/quit smoking 08/2020 on disability since last PFT's ? 2017/18 relocated to [facility] summer 2021 and referred to pulmonary clinic in [place]  01/24/2020 by Bonna Gains NP.  Admit date: 11/04/2019 Discharge date: 11/10/2019   Admitted From: Home Disposition: Home   Recommendations for Outpatient Follow-up:  Follow up with PCP in 1-2 weeks Please obtain BMP/CBC in one week   Home Health: Equipment/Devices: Home oxygen at 3 L, nebulizer machine   Discharge Condition: Stable CODE STATUS: Full code Diet recommendation: Heart healthy   Brief/Interim Summary: Charles Weeks is a 56 y.o. male with a history of CAD, MI, COPD.  Patient just recently moved to [place] from [place].  He has no medications and has not seen a physician here.  He started having shortness of breath about 3 weeks ago which has been increasing.  He has been having cough with some purulent sputum production.  He had a relative give him some steroids over the past few days, without too much improvement.  No other palliating or provoking factors.  Denies fevers, chills, nausea, vomiting.   Emergency Department Course: Patient started on BiPAP and gradually weaned to nasal cannula.  Initial saturations were in the 80s.  Improved with steroids and BiPAP.  Remained stable on nasal cannula.   Discharge Diagnoses:  Principal Problem:   Acute respiratory failure with hypoxia ([place]) Active Problems:   Coronary artery disease   COPD with acute exacerbation (HCC)   Acute on chronic hypoxic respiratory failure secondary to acute COPD exacerbation Presented with hypoxia with O2 saturation in the 80s initially requiring NIV then transitioned to 3-4 L  -previously on 3L at home but has not used since Oct 2020 -Continue brovana and  pulmicort -Continue hypertonic saline nebs -continue duonebs -He was treated with IV steroids which have since been transitioned to prednisone taper -Personally reviewed chest x-ray which showed no lobular pulmonary infiltrates to suggest pneumonia -Maintain O2 saturation greater than 88 to 90% -TOC consulted to assist with PCP and pulmonology referrals -Currently, he can ambulate comfortably on 3 L of oxygen which appears to be near his baseline   -Acute COPD exacerbation Ongoing tobacco use; tobacco cessation counseling at bedside. COVID-19 negative Treated with IV steroids which was subsequently transitioned to prednisone taper Continue management as stated above -Continue Symbicort on discharge -Continue albuterol nebs as needed, provided with nebulizer machine   Hyperglycemia/impaired glucose tolerance Presented with elevated serum glucose Hemoglobin A1c 6.3 on 619 1221 Monitored on sliding scale insulin   Coronary artery disease status post PCI/history of MI Denies any anginal symptoms at the time of this exam Continue Brilinta  Not on statin, obtain lipid panel   Tobacco use disorder Tobacco cessation counseling Nicotine patch   BPH Continue terazosin Monitor urine output    History of Present Illness  01/24/2020  Pulmonary/ 1st office eval/ Charles Weeks / [place] [facility]  Chief Complaint  Patient presents with   Pulmonary Consult    Referred by Cherly Beach, NP.  Pt states hx of COPD. Pt c/o increased SOB since Winter 2020. He sometimes gets winded walking room to room at home. He is using albuterol inhaler and neb both 1-2 x per day.   Dyspnea:  [employer] walking / walk from [facility] space (sisters) / no steps but  there are times he can't get across the room s sob  Cough: better since quit  Sleep: flat bed/ 1 pillow  - no resp  SABA use:neb  once or twice daily "can't make it thru the day" and also using 2 different forms of hfa  02  Prn  rec Plan A = Automatic = Always=     Symbicort 160 Take 2 puffs first thing in am and then another 2 puffs about 12 hours later.  Work on inhaler technique: Plan B = Backup (to supplement plan A, not to replace it) Only use your albuterol inhaler as a rescue medication Plan C = Crisis (instead of Plan B but only if Plan B stops working) - only use your albuterol nebulizer if you first try Plan B and it fails to help  Make sure you check your oxygen saturations at highest level of activity  Please schedule a follow up visit in 3 months with pfts on return      05/31/2020  f/u ov/[place] office/Charles Weeks re: needs to go to lab / did stop smoking Chief Complaint  Patient presents with   Follow-up    Patient feels like breathing is the same since last visit. Cough but states that it is hard to get up mucus.   Dyspnea: basically housebound/ walks dog to porch / 25 ft bd Cough: non productive, last cig jun 2021 much better since then  Sleeping: flat bed/ no bipap/ wakes up feeling find SABA use: rarely neb,  Twice a daily proair never pre challenges or rechallenges  02: has it not using / not tracking sats with ex  Rec Plan A = Automatic = Always=    Symbicort 160 Take 2 puffs first thing in am and then another 2 puffs about 12 hours later.  Work on inhaler technique:     Plan B = Backup (to supplement plan A, not to replace it) Only use your albuterol inhaler as a rescue medication  Plan C = Crisis (instead of Plan B but only if Plan B stops working) - only use your albuterol nebulizer if you first try Plan B and it fails to help > ok to use the nebulizer up to every 4 hours but if start needing it regularly call for immediate appointment Make sure you check your oxygen saturations at highest level of activity to be sure it stays over 90%  Please remember to go to the lab and x-ray department at [facility]   for your tests - we will call you with the results when they are available. Please schedule a follow up visit in 3  months with pfts on return    Ad[facility] August 17 [facility] > d/c home with trach and PEG and f/u by Dr Redmond Baseman who rec laser surgery on trach     12/24/2020  f/u ov/ office/Charles Weeks re: GOLD IV post hosp  Chief Complaint  Patient presents with   Follow-up    Patient states that he would rather have Symbicort over [facility] inhaler feels like it does not work as good. Patient was admitted on 10/09/20. Sister states he will need surgical clearance today for his ENT Dr. Redmond Baseman   Dyspnea:  mb and back 100 ft flat s 02  Cough: clear phlegm  Sleeping: bed is flat / on back one pillow SABA use: neb twice daily  02: 4lpm  Covid status: 2 vax  Rec Ok to change dulera to symbicort 160  Take 2 puffs first thing in am and then another 2 puffs about 12 hours later.  You are cleared for surgery > Redmond Baseman  02/06/21 balloon dilatation/ no laser      06/19/2021  f/u ov/Charles Weeks office/Charles Weeks re: trach dep s/p multiple trauma  maint on symbicort 160 but also taking dulera 200   Chief Complaint  Patient presents with   Follow-up    Patient had a lot of mucus with prednisone, so stopped taking.   Dyspnea:  sits on bed most days extremely sedentary so not really limited by sob   Cough: less mucus production now/ min mucoid  Sleeping: on flat bed on either back or  side s resp cc  SABA use: not using correctly  02: 1-4lpm to keep sats > 90%  Covid status: vax x 3      No obvious day to day or daytime variability or assoc excess/ purulent sputum or mucus plugs or hemoptysis or cp or chest tightness, subjective wheeze or overt sinus or hb symptoms.   Sleeping without nocturnal  or early am exacerbation  of respiratory  c/o's or need for noct saba. Also denies any obvious fluctuation of symptoms with weather or environmental changes or other aggravating or alleviating factors except as outlined above   No unusual exposure hx or h/o childhood pna/ asthma or knowledge of premature  birth.  Current Allergies, Complete Past Medical History, Past Surgical History, Family History, and Social History were reviewed in [employer] record.  ROS  The following are not active complaints unless bolded Hoarseness, sore throat, dysphagia, dental problems, itching, sneezing,  nasal congestion or discharge of excess mucus or purulent secretions, ear ache,   fever, chills, sweats, unintended wt loss or wt gain, classically pleuritic or exertional cp,  orthopnea pnd or arm/hand swelling  or leg swelling, presyncope, palpitations, abdominal pain, anorexia, nausea, vomiting, diarrhea  or change in bowel habits or change in bladder habits, change in stools or change in urine, dysuria, hematuria,  rash, arthralgias, visual complaints, headache, numbness, weakness or ataxia or problems with walking or coordination,  change in mood or  memory.        Current Meds - NOTE:   Unable to verify as accurately reflecting what pt takes    Medication Sig   acetaminophen (TYLENOL) 325 MG tablet Take 2 tablets (650 mg total) by mouth every 6 (six) hours as needed for mild pain or moderate pain.   albuterol (PROAIR HFA) 108 (90 Base) MCG/ACT inhaler Inhale 1-2 puffs into the lungs every 4 (four) hours as needed for wheezing or shortness of breath.   albuterol (PROVENTIL) (2.5 MG/3ML) 0.083% nebulizer solution Take 3 mLs (2.5 mg total) by nebulization every 6 (six) hours as needed for wheezing or shortness of breath.   aspirin 81 MG chewable tablet Chew 1 tablet (81 mg total) by mouth daily.   bethanechol (URECHOLINE) 25 MG tablet TAKE TWO TABLETS BY MOUTH FOUR TIMES DAILY   budesonide-formoterol (SYMBICORT) 160-4.5 MCG/ACT inhaler Take 2 puffs first thing in am and then another 2 puffs about 12 hours later. (Patient taking differently: Inhale 2 puffs into the lungs in the morning and at bedtime. Take 2 puffs first thing in am and then another 2 puffs about 12 hours later.)   buPROPion  (WELLBUTRIN XL) 300 MG 24 hr tablet TAKE 1 TABLET BY MOUTH ONCE DAILY.   busPIRone (BUSPAR) 10 MG tablet TAKE ONE TABLET BY MOUTH 3 TIMES A DAY  Calcium Carb-Cholecalciferol (CALCIUM 600/VITAMIN D) 600-400 MG-UNIT CHEW Chew 1 each by mouth daily.   Cholecalciferol (VITAMIN D3) 125 MCG (5000 UT) CAPS Take 5,000 Units by mouth daily.   docusate sodium (COLACE) 100 MG capsule Take 100 mg by mouth daily as needed for moderate constipation.   DULERA 200-5 MCG/ACT AERO INHALE (2) PUFFS INTO THE LUNGS TWICE DAILY   folic acid (FOLVITE) 1 MG tablet TAKE ONE TABLET BY MOUTH ONCE DAILY.   montelukast (SINGULAIR) 10 MG tablet TAKE ONE TABLET BY MOUTH AT BEDTIME   nitroGLYCERIN (NITROSTAT) 0.4 MG SL tablet Place 0.4 mg under the tongue every 5 (five) minutes as needed for chest pain.   oxyCODONE (OXY IR/ROXICODONE) 5 MG immediate release tablet Take 1 tablet (5 mg total) by mouth every 4 (four) hours as needed for moderate pain.   OXYGEN Inhale 5 L into the lungs continuous.   pantoprazole (PROTONIX) 40 MG tablet TAKE ONE TABLET BY MOUTH ONCE DAILY.   QUEtiapine (SEROQUEL) 300 MG tablet TAKE (1/2) A TABLET BY MOUTH TWICE A DAY, AND (1) TABLET AT BEDTIME.   scopolamine (TRANSDERM-SCOP) 1 MG/3DAYS Place 1 patch (1.5 mg total) onto the skin every 3 (three) days.   tamsulosin (FLOMAX) 0.4 MG CAPS capsule TAKE (1) CAPSULE BY MOUTH TWICE DAILY.   terazosin (HYTRIN) 5 MG capsule Take 1 capsule (5 mg total) by mouth daily.                   Past Medical History:  Diagnosis Date   Acute respiratory failure with hypoxia (HCC) 11/04/2019   Anxiety    Arthritis    COPD (chronic obstructive pulmonary disease) (HCC)    COPD with acute exacerbation ([place]) 11/04/2019   Coronary artery disease    Depression    Emphysema of lung (HCC)    Hypertension    Myocardial infarct (HCC)    Oxygen deficiency       Objective:    06/19/2021        182 12/24/2020        181  05/31/20 194 lb 12.8 oz (88.4 kg)  03/06/20  186 lb (84.4 kg)  01/27/20 186 lb (84.4 kg)    Vital signs reviewed  06/19/2021  - Note at rest 02 sats  97% on 1lpm trach collar    General appearance:    w/c bound extremely hoarse wm nad / trach in place     HEENT : pt wearing mask not removed for exam due to covid - 19 concerns.    NECK :  without JVD/Nodes/TM/ nl carotid upstrokes bilaterally/ trach in place/ site clean   LUNGS: no acc muscle use,  Mild barrel  contour chest wall with bilateral  Distant bs s audible wheeze and  without cough on insp or exp maneuvers  and mild  Hyperresonant  to  percussion bilaterally     CV:  RRR  no s3 or murmur or increase in P2, and no edema   ABD:  soft and nontender with pos end  insp Hoover's  in the supine position. No bruits or organomegaly appreciated, bowel sounds nl  MS:    ext warm without deformities, calf tenderness, cyanosis or clubbing No obvious joint restrictions   SKIN: warm and dry without lesions    NEURO:  alert, approp, nl sensorium with  no motor or cerebellar deficits apparent.         Assessment

## 2021-06-20 ENCOUNTER — Other Ambulatory Visit: Payer: Self-pay | Admitting: Internal Medicine

## 2021-06-22 ENCOUNTER — Other Ambulatory Visit: Payer: Self-pay | Admitting: Internal Medicine

## 2021-06-25 NOTE — Telephone Encounter (Signed)
Oxycodone approved through [employer]

## 2021-07-03 ENCOUNTER — Encounter: Payer: Self-pay | Admitting: Physical Medicine & Rehabilitation

## 2021-07-03 ENCOUNTER — Encounter: Payer: Medicaid Other | Attending: Physical Medicine & Rehabilitation | Admitting: Physical Medicine & Rehabilitation

## 2021-07-03 ENCOUNTER — Other Ambulatory Visit: Payer: Self-pay

## 2021-07-03 VITALS — BP 129/86 | HR 102 | Ht 68.5 in

## 2021-07-03 DIAGNOSIS — S22069A Unspecified fracture of T7-T8 vertebra, initial encounter for closed fracture: Secondary | ICD-10-CM | POA: Insufficient documentation

## 2021-07-03 DIAGNOSIS — G894 Chronic pain syndrome: Secondary | ICD-10-CM | POA: Insufficient documentation

## 2021-07-03 DIAGNOSIS — S22059A Unspecified fracture of T5-T6 vertebra, initial encounter for closed fracture: Secondary | ICD-10-CM | POA: Insufficient documentation

## 2021-07-03 DIAGNOSIS — S22049A Unspecified fracture of fourth thoracic vertebra, initial encounter for closed fracture: Secondary | ICD-10-CM | POA: Insufficient documentation

## 2021-07-03 DIAGNOSIS — J386 Stenosis of larynx: Secondary | ICD-10-CM | POA: Insufficient documentation

## 2021-07-03 DIAGNOSIS — S22000G Wedge compression fracture of unspecified thoracic vertebra, subsequent encounter for fracture with delayed healing: Secondary | ICD-10-CM | POA: Diagnosis not present

## 2021-07-03 DIAGNOSIS — W19XXXA Unspecified fall, initial encounter: Secondary | ICD-10-CM | POA: Insufficient documentation

## 2021-07-03 DIAGNOSIS — S22008S Other fracture of unspecified thoracic vertebra, sequela: Secondary | ICD-10-CM | POA: Insufficient documentation

## 2021-07-03 DIAGNOSIS — F418 Other specified anxiety disorders: Secondary | ICD-10-CM | POA: Diagnosis not present

## 2021-07-03 DIAGNOSIS — Z930 Tracheostomy status: Secondary | ICD-10-CM | POA: Insufficient documentation

## 2021-07-03 MED ORDER — XTAMPZA ER 9 MG PO C12A: 9.0000 mg | EXTENDED_RELEASE_CAPSULE | Freq: Two times a day (BID) | ORAL | 0 refills | Status: DC

## 2021-07-03 MED ORDER — OXYCODONE HCL 5 MG PO TABS: 5.0000 mg | ORAL_TABLET | ORAL | 0 refills | Status: DC | PRN

## 2021-07-03 NOTE — Patient Instructions (Signed)
PLEASE FEEL FREE TO CALL OUR OFFICE WITH ANY PROBLEMS OR QUESTIONS ([phone])      

## 2021-07-03 NOTE — Progress Notes (Signed)
Subjective:    Patient ID: Charles Weeks, male    DOB: 12-06-1965, 56 y.o.   MRN: [id number]  HPI  Charles Weeks is here regarding his chronic pain and chronic respiratory issues. He continues to have his good and bad days with his tracheal stenosis, breathing and phonation. He sees ENT today.   From a pain standpoint he is still struggling. He doesn't move as much because of his pain. His sister is concerned about him developing blood clots due to his sedentary lifestyle.  He is not getting outside. He does do a little bit of ambulating within the house. They are hoping he might be receiving CAPS soon as they've been on the list for some time and his sister has been asking for more urgent assist.   Pain is generally in his mid to upper back on the right side with radiation into the axilla and chest as well as to the right flank.  It is made worse with mobility and even can bother him when he sitting straight up.  He gets relief when he can support his arms or lay in somewhat of a supine position.   Pain Inventory Average Pain 8 Pain Right Now 8 My pain is sharp, stabbing, and aching  In the last 24 hours, has pain interfered with the following? General activity 10 Relation with others 10 Enjoyment of life 10 What TIME of day is your pain at its worst? morning  and evening Sleep (in general) Poor  Pain is worse with: walking, bending, standing, and some activites Pain improves with: rest and medication Relief from Meds: 2  Family History  Problem Relation Age of Onset   Heart disease Mother    Heart disease Father    Diabetes Sister    Social History   Socioeconomic History   Marital status: Single    Spouse name: Not on file   Number of children: 2   Years of education: Not on file   Highest education level: 7th grade  Occupational History   Not on file  Tobacco Use   Smoking status: Former    Packs/day: 1.50    Types: Cigarettes    Quit date: 08/17/2020    Years  since quitting: 0.8   Smokeless tobacco: Never  Vaping Use   Vaping Use: Never used  Substance and Sexual Activity   Alcohol use: Not Currently   Drug use: Not Currently   Sexual activity: Not on file  Other Topics Concern   Not on file  Social History Narrative   ** Merged History Encounter **       Lives with sister  Both daughters live close by   Sister has dog   Enjoys: shooting pool, fishing, Retail banker  Diet: eats all food groups Caffeine: pop-dr pepper 2 liter  Water: 4-6 cups   Wears seat belt  No driving 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        Social Determinants of Health   Financial Resource Strain: Not on file  Food Insecurity: Not on file  Transportation Needs: Not on file  Physical Activity: Not on file  Stress: Not on file  Social Connections: Not on file   Past Surgical History:  Procedure Laterality Date   BALLOON DILATION N/A 02/06/2021   Procedure: BALLOON DILATION;  Surgeon: Melida Quitter, MD;  Location: [place];  Service: ENT;  Laterality: N/A;   BOWEL RESECTION  2006   MICROLARYNGOSCOPY WITH LASER  AND BALLOON DILATION N/A 02/06/2021   Procedure: SUSPENDED MICRO DIRECT LARYNGOSCOPY;  Surgeon: Melida Quitter, MD;  Location: [place];  Service: ENT;  Laterality: N/A;   PEG PLACEMENT N/A 09/03/2020   Procedure: PERCUTANEOUS ENDOSCOPIC GASTROSTOMY (PEG) PLACEMENT;  Surgeon: Georganna Skeans, MD;  Location: [place];  Service: General;  Laterality: N/A;   PERCUTANEOUS TRACHEOSTOMY N/A 09/03/2020   Procedure: PERCUTANEOUS TRACHEOSTOMY USING 6 SHILEY;  Surgeon: Georganna Skeans, MD;  Location: [place];  Service: General;  Laterality: N/A;   SKIN GRAFT     TONSILLECTOMY AND ADENOIDECTOMY     Past Surgical History:  Procedure Laterality Date   BALLOON DILATION N/A 02/06/2021   Procedure: BALLOON DILATION;  Surgeon: Melida Quitter, MD;  Location: [place];  Service: ENT;  Laterality: N/A;   BOWEL RESECTION  2006   MICROLARYNGOSCOPY WITH LASER AND  BALLOON DILATION N/A 02/06/2021   Procedure: SUSPENDED MICRO DIRECT LARYNGOSCOPY;  Surgeon: Melida Quitter, MD;  Location: [place];  Service: ENT;  Laterality: N/A;   PEG PLACEMENT N/A 09/03/2020   Procedure: PERCUTANEOUS ENDOSCOPIC GASTROSTOMY (PEG) PLACEMENT;  Surgeon: Georganna Skeans, MD;  Location: [place];  Service: General;  Laterality: N/A;   PERCUTANEOUS TRACHEOSTOMY N/A 09/03/2020   Procedure: PERCUTANEOUS TRACHEOSTOMY USING 6 SHILEY;  Surgeon: Georganna Skeans, MD;  Location: [place];  Service: General;  Laterality: N/A;   SKIN GRAFT     TONSILLECTOMY AND ADENOIDECTOMY     Past Medical History:  Diagnosis Date   Anxiety 01/19/2020   Arthritis    Asthma    Benign prostatic hyperplasia with weak urinary stream 12/08/2019   BPH (benign prostatic hyperplasia) 08/17/2020   Chronic respiratory failure with hypoxia ([place]) 01/24/2020   Has home 02 but not using as of 01/24/2020  -  01/24/2020   Walked RA  approx   200 ft  @ slow pace  stopped due to  Dizzy with sats still 95%      Cigarette smoker 01/24/2020   Stopped regular smoking 10/2019 but still "now and then" as of 01/24/2020    COPD (chronic obstructive pulmonary disease) ([place]) 08/17/2020   gold   COPD GOLD ?     Quit smoking 10/2019  - Labs ordered 01/24/2020  :  allergy profile   alpha one AT phenotype   - 01/24/2020  After extensive coaching inhaler device,  effectiveness =    75% (short Ti) > change symb 80 to 160 2bid      Coronary artery disease 06/08/2018   DES to RCA, [facility] [place] [place])   Depression, major, single episode, moderate ([place]) 01/19/2020   Dyspnea    Encounter for screening for malignant neoplasm of colon 12/08/2019   Essential hypertension 12/08/2019   History of MI (myocardial infarction) 01/19/2020   HTN (hypertension) 08/17/2020   Myocardial infarct ([place]) 2019   Pneumonia    BP 129/86    Pulse (!) 102    Ht 5' 8.5" (1.74 m)    BMI 25.77 kg/m   Opioid Risk Score:   [place] Risk Score:  `1  Depression  screen PHQ 2/9  Depression screen [facility] 2/9 07/03/2021 04/10/2021 02/05/2021 12/14/2020 11/15/2020 11/05/2020 04/16/2020  Decreased Interest 0 1 0 0 1 0 2  Down, Depressed, Hopeless 0 1 1 0 2 3 2   PHQ - 2 Score 0 2 1 0 3 3 4   Altered sleeping - - 1 0 0 0 2  Tired, decreased energy - - 1 0 3 3 2   Change in appetite - -  0 0 0 0 1  Feeling bad or failure about yourself  - - 0 0 0 0 0  Trouble concentrating - - 0 0 0 0 0  Moving slowly or fidgety/restless - - 0 0 0 0 0  Suicidal thoughts - - 0 0 0 3 0  PHQ-9 Score - - 3 0 6 9 9   Difficult doing work/chores - - Not difficult at all Not difficult at all - Very difficult -  Some recent data might be hidden     Review of Systems  Constitutional: Negative.   HENT: Negative.    Eyes: Negative.   Respiratory: Negative.    Cardiovascular: Negative.   Gastrointestinal: Negative.   Endocrine: Negative.   Genitourinary: Negative.   Musculoskeletal:  Positive for back pain, gait problem and neck pain.  Skin: Negative.   Allergic/Immunologic: Negative.   Hematological: Negative.   Psychiatric/Behavioral:  Positive for sleep disturbance.       Objective:   Physical Exam General: No acute distress HEENT: NCAT, EOMI, oral membranes moist Cards: reg rate  Chest: normal effort Abdomen: Soft, NT, ND Skin: dry, intact Extremities: no edema Psych: pleasant and appropriate   Skin: intact Neuro: Neurologically appears to be alert and oriented x3.  Poor phonation as noted above.  Strength is grossly 4+ to 5 out of 5 in all 4 limbs with no focal sensory findings.  Balance was fair to good.  was in transport chair Musculoskeletal: Significant back discomfort in the T4-T10 range.  He has ongoing dextroscoliosis with rotation of the spine as well.  Pain is made worse with sitting as well as is with palpation of the right mid back.           Assessment & Plan:  1.  Debility secondary to fall 08/17/20 with 3 column T8 vertebral body fracture/T7 neural arch  fracture/right TVP T4-8/right C7 transverse process fracture through the foramen.   progressive disease on recent mri              -maintain HEP as much as he can   -hopefully they can have a CAPS worker out sooner than later 2.  . Pain Management:  -Given the increase in his pain and his diminishing mobility which is only exacerbating the situation, we will begin a trial of Xtampza 9 mg twice daily Continue Oxycodone 5mg  q6 prn, #120 to allow him 4 per day.  Asked him not to take the immediate release oxycodone with Xtampza initially to see how he is able to tolerate them individually.             -May take up to 2000 mg of Tylenol on a daily basis and 3000 occasionally.             - ibuprofen 800 mg twice daily as needed with food             -mild radiculopathy?.  Still consider gabapentin or similar             We will continue the controlled substance monitoring program, this consists of regular clinic visits, examinations, routine drug screening, pill counts as well as use of [place] Controlled Substance Reporting System. [place] was reviewed today.               -Given the difficulty he has with transportation and mobility he can call for refills next month.               4. Mood: Continue  Wellbutrin 300 mg daily, BuSpar 10 mg 3 times daily, Klonopin 0.5 mg nightly,              -Seroquel 150 mg twice daily and 300 mg nightly              -fairly stable for now.    5.  CAD/MI/DES.  Continue aspirin per cardiology.   6.  Acute hypercarbic ventilatory dependent respiratory failure/COPD.  Tracheostomy 09/03/2020 per Dr. Grandville Silos.   -ENT/pumonary  following.                   -  7.  BPH/urine retention:              -oob to void for all attempts              -Continue hyrtin, Flomax and Urecholine.  Will increase hytrin to 4mg  which should help with bp too.  8 Constipation---              softener prn   -Importance of bowel regimen has been discussed   15 minutes of face to face  patient care time were spent during this visit. All questions were encouraged and answered. Follow up with in 2 months with nurse practitioner

## 2021-07-09 ENCOUNTER — Encounter: Payer: Medicaid Other | Admitting: Internal Medicine

## 2021-07-10 ENCOUNTER — Telehealth: Payer: Self-pay

## 2021-07-10 DIAGNOSIS — S22000G Wedge compression fracture of unspecified thoracic vertebra, subsequent encounter for fracture with delayed healing: Secondary | ICD-10-CM

## 2021-07-10 DIAGNOSIS — G894 Chronic pain syndrome: Secondary | ICD-10-CM

## 2021-07-10 MED ORDER — XTAMPZA ER 9 MG PO C12A: 9.0000 mg | EXTENDED_RELEASE_CAPSULE | Freq: Two times a day (BID) | ORAL | 0 refills | Status: DC

## 2021-07-10 NOTE — Telephone Encounter (Signed)
Patient's sister is calling to get refill for Xtampza to [facility]. He was given a 7 day supply at last office visit with Dr. Naaman Plummer. Per PMP, Ginger Organ was filled on 07/03/21

## 2021-07-10 NOTE — Addendum Note (Signed)
Addended by: Bayard Hugger on: 07/10/2021 04:01 PM   Modules accepted: Orders

## 2021-07-10 NOTE — Telephone Encounter (Signed)
Dr Riley Kill note was reviewed.  PMP was Reviewed, spoke with Charles Weeks sister, she is dispensing his medication. Xtampza e-scribed, she was instructed to send a My Chart message in three weeks with update, she verbalizes understanding.

## 2021-07-11 ENCOUNTER — Telehealth: Payer: Self-pay

## 2021-07-11 NOTE — Telephone Encounter (Signed)
PA for Xtampza sent to insurance through CoverMyMeds 

## 2021-07-18 NOTE — Telephone Encounter (Addendum)
Prior auth resubmitted to [employer] via CoverMyMeds.  Approval was previously awarded coverage through 10/08/21. ?

## 2021-07-20 ENCOUNTER — Other Ambulatory Visit: Payer: Self-pay | Admitting: Physical Medicine & Rehabilitation

## 2021-07-29 ENCOUNTER — Telehealth: Payer: Self-pay | Admitting: *Deleted

## 2021-07-29 DIAGNOSIS — S22000G Wedge compression fracture of unspecified thoracic vertebra, subsequent encounter for fracture with delayed healing: Secondary | ICD-10-CM

## 2021-07-29 DIAGNOSIS — G894 Chronic pain syndrome: Secondary | ICD-10-CM

## 2021-07-29 MED ORDER — XTAMPZA ER 9 MG PO C12A: 9.0000 mg | EXTENDED_RELEASE_CAPSULE | Freq: Two times a day (BID) | ORAL | 0 refills | Status: DC

## 2021-07-29 MED ORDER — OXYCODONE HCL 5 MG PO TABS: 5.0000 mg | ORAL_TABLET | ORAL | 0 refills | Status: DC | PRN

## 2021-07-29 NOTE — Telephone Encounter (Signed)
PMP was Reviewed.  ?Xtampza and Oxycodone e-scribed today.  ?Placed a call to Ms. Hunt regarding the above, she verbalizes understanding.  ?

## 2021-07-29 NOTE — Telephone Encounter (Signed)
Mr Drouillard's sister called for refills on his medication oxycodone last filled per PMP 07/03/21. His xtampza was filled 07/11/21. His next appt is 08/28/21. ?

## 2021-08-22 ENCOUNTER — Telehealth: Payer: Self-pay | Admitting: Internal Medicine

## 2021-08-22 NOTE — Telephone Encounter (Signed)
Pt made a video appt 08-27-21 with patel to get dme supplies  ?

## 2021-08-22 NOTE — Telephone Encounter (Signed)
POA Called in on patient behalf requesting  ? ?Light weight wheel chair orders  ?Bedside table ?Hospital bed  ?Supporting office note for requested item  ? ?Wants orders sent in to [place]  ? ?Also wants a call back in regard.  ?

## 2021-08-27 ENCOUNTER — Telehealth (INDEPENDENT_AMBULATORY_CARE_PROVIDER_SITE_OTHER): Payer: Medicaid Other | Admitting: Internal Medicine

## 2021-08-27 ENCOUNTER — Encounter: Payer: Self-pay | Admitting: Internal Medicine

## 2021-08-27 DIAGNOSIS — R5381 Other malaise: Secondary | ICD-10-CM | POA: Diagnosis not present

## 2021-08-27 DIAGNOSIS — S22008S Other fracture of unspecified thoracic vertebra, sequela: Secondary | ICD-10-CM | POA: Diagnosis not present

## 2021-08-27 DIAGNOSIS — J9611 Chronic respiratory failure with hypoxia: Secondary | ICD-10-CM | POA: Diagnosis not present

## 2021-08-27 DIAGNOSIS — J398 Other specified diseases of upper respiratory tract: Secondary | ICD-10-CM | POA: Diagnosis not present

## 2021-08-27 MED ORDER — UNABLE TO FIND: 0 refills | Status: DC

## 2021-08-27 NOTE — Assessment & Plan Note (Signed)
Had thoracic brace in place, had delayed healing ?Followed by Spine surgery and PM&R ?He still has limited mobility due to severe back pain ?Unable to sit from lying down position without support, mood benefit from hospital bed ? ?

## 2021-08-27 NOTE — Assessment & Plan Note (Signed)
Due to COPD Home O2 PRN Well-controlled with Dulera Symbicort and PRN Albuterol F/u with Pulmonology 

## 2021-08-27 NOTE — Assessment & Plan Note (Signed)
Has trach in place ?

## 2021-08-27 NOTE — Assessment & Plan Note (Addendum)
Due to prolonged bedridden position from fall leading to thoracic spine fracture ?Also has chronic respiratory failure from COPD ?Has tracheostomy for subglottic stenosis ?Would benefit from hospital bed for better position change in the bed and light weight wheelchair for better mobility ? ?- Patient requires the head of the bed to be elevated more than 30 degrees most of the time due to his underlying COPD and to avoid aspiration as he has trach in place ? ?-Patient has limited mobility that impairs his ability to perform ADLs in a customary location in the home ?-Unable to use cane or walker due to UE weakness ?-Unable to safely self propel the wheelchair in the home, but has caregiver that can provide assistance ?-Unable to use her standard wheelchair due to UE weakness from thoracic spine fracture and muscular atrophy from prolonged bedrest ?-His sister is his caregiver who is available, willing and able to provide assistance with the transport chair. ?

## 2021-08-27 NOTE — Progress Notes (Addendum)
?  ? ?Virtual Visit via Video Note  ? ?This visit type was conducted due to national recommendations for restrictions regarding the COVID-19 Pandemic (e.g. social distancing) in an effort to limit this patient's exposure and mitigate transmission in our community.  Due to his co-morbid illnesses, this patient is at least at moderate risk for complications without adequate follow up.  This format is felt to be most appropriate for this patient at this time.  All issues noted in this document were discussed and addressed.  A limited physical exam was performed with this format. ? ?   ? ?Evaluation Performed:  Follow-up visit ? ?Date:  09/09/2021  ? ?ID:  Charles Weeks, DOB [birth year], MRN [id number] ? ?Patient Location: Home ?Provider Location: Office/Clinic ?Participants: Patient and sister ? ? ?Location of Patient: Home ?Location of Provider: Telehealth ?Consent was obtain for visit to be over via telehealth. ?I verified that I am speaking with the correct person using two identifiers. ? ?PCP:  Anabel Halon, MD  ? ?Chief Complaint: Generalized weakness and thoracic spine pain ? ?History of Present Illness:   ? ?Charles Weeks is a 56 y.o. 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 ? ?He still has chronic dyspnea and thoracic spine pain since the fall.  He is not able to sit by himself from lying down position without needing help.  His sister has to give him support to sit, which has worsened her shoulder pain as well.  He currently has a regular bed at home and would benefit from hospital bed.  He also has trouble maintaining balance while walking and his sister has to use wheelchair for his ambulation.  He is currently followed by PM&R for chronic pain and physical deconditioning. ? ?The patient does not have symptoms concerning for COVID-19  infection (fever, chills, cough, or new shortness of breath).  ? ?Past Medical, Surgical, Social History, Allergies, and Medications have been Reviewed. ? ?Past Medical History:  ?Diagnosis Date  ? Anxiety 01/19/2020  ? Arthritis   ? Asthma   ? Benign prostatic hyperplasia with weak urinary stream 12/08/2019  ? BPH (benign prostatic hyperplasia) 08/17/2020  ? Chronic respiratory failure with hypoxia (HCC) 01/24/2020  ? Has home 02 but not using as of 01/24/2020  -  01/24/2020   Walked RA  approx   200 ft  @ slow pace  stopped due to  Dizzy with sats still 95%     ? Cigarette smoker 01/24/2020  ? Stopped regular smoking 10/2019 but still "now and then" as of 01/24/2020   ? COPD (chronic obstructive pulmonary disease) (HCC) 08/17/2020  ? gold  ? COPD GOLD ?    ? Quit smoking 10/2019  - Labs ordered 01/24/2020  :  allergy profile   alpha one AT phenotype   - 01/24/2020  After extensive coaching inhaler device,  effectiveness =    75% (short Ti) > change symb 80 to 160 2bid     ? Coronary artery disease 06/08/2018  ? DES to RCA, [facility] [place], [place])  ? Depression, major, single episode, moderate (HCC) 01/19/2020  ? Dyspnea   ? Encounter for screening for malignant neoplasm of colon 12/08/2019  ? Essential hypertension 12/08/2019  ? History of MI (myocardial infarction) 01/19/2020  ? HTN (hypertension) 08/17/2020  ? Myocardial infarct [facility]) 2019  ? Pneumonia   ? ?  Past Surgical History:  ?Procedure Laterality Date  ? BALLOON DILATION N/A 02/06/2021  ? Procedure: BALLOON DILATION;  Surgeon: Christia Reading, MD;  Location: [facility] OR;  Service: ENT;  Laterality: N/A;  ? BOWEL RESECTION  2006  ? MICROLARYNGOSCOPY WITH LASER AND BALLOON DILATION N/A 02/06/2021  ? Procedure: SUSPENDED MICRO DIRECT LARYNGOSCOPY;  Surgeon: Christia Reading, MD;  Location: [facility] OR;  Service: ENT;  Laterality: N/A;  ? PEG PLACEMENT N/A 09/03/2020  ? Procedure: PERCUTANEOUS ENDOSCOPIC GASTROSTOMY (PEG) PLACEMENT;  Surgeon: Violeta Gelinas, MD;  Location: [facility]  OR;  Service: General;  Laterality: N/A;  ? PERCUTANEOUS TRACHEOSTOMY N/A 09/03/2020  ? Procedure: PERCUTANEOUS TRACHEOSTOMY USING 6 SHILEY;  Surgeon: Violeta Gelinas, MD;  Location: [facility] OR;  Service: General;  Laterality: N/A;  ? SKIN GRAFT    ? TONSILLECTOMY AND ADENOIDECTOMY    ?  ? ?Current Meds  ?Medication Sig  ? acetaminophen (TYLENOL) 325 MG tablet Take 2 tablets (650 mg total) by mouth every 6 (six) hours as needed for mild pain or moderate pain.  ? albuterol (PROAIR HFA) 108 (90 Base) MCG/ACT inhaler Inhale 1-2 puffs into the lungs every 4 (four) hours as needed for wheezing or shortness of breath.  ? albuterol (PROVENTIL) (2.5 MG/3ML) 0.083% nebulizer solution INHALE (1) VIAL VIA NEBULIZATION EVERY SIX HOURS AS NEEDED FOR WHEEZING OR SHORTNESS OF BREATH.  ? aspirin 81 MG chewable tablet Chew 1 tablet (81 mg total) by mouth daily.  ? bethanechol (URECHOLINE) 25 MG tablet TAKE TWO TABLETS BY MOUTH FOUR TIMES DAILY  ? budesonide-formoterol (SYMBICORT) 160-4.5 MCG/ACT inhaler Take 2 puffs first thing in am and then another 2 puffs about 12 hours later. (Patient taking differently: Inhale 2 puffs into the lungs in the morning and at bedtime. Take 2 puffs first thing in am and then another 2 puffs about 12 hours later.)  ? buPROPion (WELLBUTRIN XL) 300 MG 24 hr tablet TAKE 1 TABLET BY MOUTH ONCE DAILY.  ? busPIRone (BUSPAR) 10 MG tablet TAKE ONE TABLET BY MOUTH 3 TIMES A DAY  ? Calcium Carb-Cholecalciferol (CALCIUM 600/VITAMIN D) 600-400 MG-UNIT CHEW Chew 1 each by mouth daily.  ? Cholecalciferol (VITAMIN D3) 125 MCG (5000 UT) CAPS Take 5,000 Units by mouth daily.  ? docusate sodium (COLACE) 100 MG capsule Take 100 mg by mouth daily as needed for moderate constipation.  ? folic acid (FOLVITE) 1 MG tablet TAKE ONE TABLET BY MOUTH ONCE DAILY.  ? montelukast (SINGULAIR) 10 MG tablet TAKE ONE TABLET BY MOUTH AT BEDTIME  ? nitroGLYCERIN (NITROSTAT) 0.4 MG SL tablet Place 0.4 mg under the tongue every 5 (five) minutes  as needed for chest pain.  ? OXYGEN Inhale 5 L into the lungs continuous.  ? pantoprazole (PROTONIX) 40 MG tablet TAKE ONE TABLET BY MOUTH ONCE DAILY.  ? QUEtiapine (SEROQUEL) 300 MG tablet TAKE (1/2) A TABLET BY MOUTH TWICE A DAY, AND (1) TABLET AT BEDTIME.  ? scopolamine (TRANSDERM-SCOP) 1 MG/3DAYS Place 1 patch (1.5 mg total) onto the skin every 3 (three) days.  ? tamsulosin (FLOMAX) 0.4 MG CAPS capsule TAKE (1) CAPSULE BY MOUTH TWICE DAILY.  ? terazosin (HYTRIN) 5 MG capsule Take 1 capsule (5 mg total) by mouth daily.  ? [DISCONTINUED] oxyCODONE (OXY IR/ROXICODONE) 5 MG immediate release tablet Take 1 tablet (5 mg total) by mouth every 4 (four) hours as needed for moderate pain.  ? [DISCONTINUED] oxyCODONE ER (XTAMPZA ER) 9 MG C12A Take 9 mg by mouth every 12 (twelve) hours. Do Not Fill Before  08/05/2021  ? [DISCONTINUED] UNABLE TO FIND Med Name: Bedside Table  ?  ? ?Allergies:   Patient has no known allergies.  ? ?ROS:   ?Please see the history of present illness.    ? ?All other systems reviewed and are negative. ? ? ?Labs/Other Tests and Data Reviewed:   ? ?Recent Labs: ?10/10/2020: ALT 27 ?02/06/2021: BUN 12; Creatinine, Ser 1.15; Hemoglobin 15.0; Platelets 209; Potassium 4.7; Sodium 139  ? ?Recent Lipid Panel ?Lab Results  ?Component Value Date/Time  ? CHOL 130 04/23/2021 10:41 AM  ? TRIG 135 04/23/2021 10:41 AM  ? HDL 53 04/23/2021 10:41 AM  ? CHOLHDL 2.5 04/23/2021 10:41 AM  ? LDLCALC 50 04/23/2021 10:41 AM  ? ? ?Wt Readings from Last 3 Encounters:  ?09/03/21 195 lb (88.5 kg)  ?06/19/21 172 lb (78 kg)  ?04/10/21 181 lb (82.1 kg)  ?  ? ?ASSESSMENT & PLAN:   ? ?Chronic respiratory failure with hypoxia (HCC) ?Due to COPD ?Home O2 PRN ?Well-controlled with Dulera Symbicort and PRN Albuterol ?F/u with Pulmonology ? 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 ?Fracture of spine, thoracic, without spinal cord injury, closed, sequela ?Had thoracic brace in place, had delayed healing ?Followed by Spine surgery and  PM&R ?He still has limited mobility due to severe back pain ?Unable to sit from lying down position without support, mood benefit from hospital bed ? ? ?Physical deconditioning ?Due to prolonged bedridden posi

## 2021-08-28 ENCOUNTER — Encounter: Payer: Medicaid Other | Attending: Physical Medicine & Rehabilitation | Admitting: Registered Nurse

## 2021-08-28 ENCOUNTER — Other Ambulatory Visit: Payer: Self-pay | Admitting: Physical Medicine & Rehabilitation

## 2021-08-28 DIAGNOSIS — G894 Chronic pain syndrome: Secondary | ICD-10-CM | POA: Insufficient documentation

## 2021-08-28 DIAGNOSIS — Z930 Tracheostomy status: Secondary | ICD-10-CM | POA: Insufficient documentation

## 2021-08-28 DIAGNOSIS — S22008S Other fracture of unspecified thoracic vertebra, sequela: Secondary | ICD-10-CM | POA: Insufficient documentation

## 2021-08-28 DIAGNOSIS — Z5181 Encounter for therapeutic drug level monitoring: Secondary | ICD-10-CM | POA: Insufficient documentation

## 2021-08-28 DIAGNOSIS — Z79891 Long term (current) use of opiate analgesic: Secondary | ICD-10-CM | POA: Insufficient documentation

## 2021-08-28 DIAGNOSIS — S22000G Wedge compression fracture of unspecified thoracic vertebra, subsequent encounter for fracture with delayed healing: Secondary | ICD-10-CM | POA: Insufficient documentation

## 2021-08-28 DIAGNOSIS — J386 Stenosis of larynx: Secondary | ICD-10-CM | POA: Insufficient documentation

## 2021-08-28 NOTE — Progress Notes (Deleted)
? ?Subjective:  ? ? Patient ID: Charles Weeks, male    DOB: [birth year], 56 y.o.   MRN: [id number] ? ?HPI ?Pain Inventory ?Average Pain {NUMBERS; 0-10:5044} ?Pain Right Now {NUMBERS; 0-10:5044} ?My pain is {PAIN DESCRIPTION:21022940} ? ?In the last 24 hours, has pain interfered with the following? ?General activity {NUMBERS; 0-10:5044} ?Relation with others {NUMBERS; 0-10:5044} ?Enjoyment of life {NUMBERS; 0-10:5044} ?What TIME of day is your pain at its worst? {time of day:24191} ?Sleep (in general) {BHH GOOD/FAIR/POOR:22877} ? ?Pain is worse with: {ACTIVITIES:21022942} ?Pain improves with: {PAIN IMPROVES SV:5789238 ?Relief from Meds: {NUMBERS; 0-10:5044} ? ?Family History  ?Problem Relation Age of Onset  ? Heart disease Mother   ? Heart disease Father   ? Diabetes Sister   ? ?Social History  ? ?Socioeconomic History  ? Marital status: Single  ?  Spouse name: Not on file  ? Number of children: 2  ? Years of education: Not on file  ? Highest education level: 7th grade  ?Occupational History  ? Not on file  ?Tobacco Use  ? Smoking status: Former  ?  Packs/day: 1.50  ?  Types: Cigarettes  ?  Quit date: 08/17/2020  ?  Years since quitting: 1.0  ? Smokeless tobacco: Never  ?Vaping Use  ? Vaping Use: Never used  ?Substance and Sexual Activity  ? Alcohol use: Not Currently  ? Drug use: Not Currently  ? Sexual activity: Not on file  ?Other Topics Concern  ? Not on file  ?Social History Narrative  ? ** Merged History Encounter **  ?    ? Lives with sister  ?Both daughters live close by  ? ?Sister has dog  ? ?Enjoys: shooting pool, fishing, Retail banker ? ?Diet: eats all food groups ?Caffeine: pop-dr pepper 2 liter  ?Water: 4-6 cups  ? ?Wears seat belt  ?No driving for him ?Smoke detectors  ?Fi  ? re Extinguisher ?No weapons   ? ? ?  ? ?Social Determinants of Health  ? ?Financial Resource Strain: Not on file  ?Food Insecurity: Not on file  ?Transportation Needs: Not on file  ?Physical Activity: Not on file  ?Stress: Not on file   ?Social Connections: Not on file  ? ?Past Surgical History:  ?Procedure Laterality Date  ? BALLOON DILATION N/A 02/06/2021  ? Procedure: BALLOON DILATION;  Surgeon: Melida Quitter, MD;  Location: [place];  Service: ENT;  Laterality: N/A;  ? BOWEL RESECTION  2006  ? MICROLARYNGOSCOPY WITH LASER AND BALLOON DILATION N/A 02/06/2021  ? Procedure: SUSPENDED MICRO DIRECT LARYNGOSCOPY;  Surgeon: Melida Quitter, MD;  Location: [place];  Service: ENT;  Laterality: N/A;  ? PEG PLACEMENT N/A 09/03/2020  ? Procedure: PERCUTANEOUS ENDOSCOPIC GASTROSTOMY (PEG) PLACEMENT;  Surgeon: Georganna Skeans, MD;  Location: [place];  Service: General;  Laterality: N/A;  ? PERCUTANEOUS TRACHEOSTOMY N/A 09/03/2020  ? Procedure: PERCUTANEOUS TRACHEOSTOMY USING 6 SHILEY;  Surgeon: Georganna Skeans, MD;  Location: [place];  Service: General;  Laterality: N/A;  ? SKIN GRAFT    ? TONSILLECTOMY AND ADENOIDECTOMY    ? ?Past Surgical History:  ?Procedure Laterality Date  ? BALLOON DILATION N/A 02/06/2021  ? Procedure: BALLOON DILATION;  Surgeon: Melida Quitter, MD;  Location: [place];  Service: ENT;  Laterality: N/A;  ? BOWEL RESECTION  2006  ? MICROLARYNGOSCOPY WITH LASER AND BALLOON DILATION N/A 02/06/2021  ? Procedure: SUSPENDED MICRO DIRECT LARYNGOSCOPY;  Surgeon: Melida Quitter, MD;  Location: [place];  Service: ENT;  Laterality: N/A;  ? PEG PLACEMENT N/A 09/03/2020  ?  Procedure: PERCUTANEOUS ENDOSCOPIC GASTROSTOMY (PEG) PLACEMENT;  Surgeon: Georganna Skeans, MD;  Location: [place];  Service: General;  Laterality: N/A;  ? PERCUTANEOUS TRACHEOSTOMY N/A 09/03/2020  ? Procedure: PERCUTANEOUS TRACHEOSTOMY USING 6 SHILEY;  Surgeon: Georganna Skeans, MD;  Location: [place];  Service: General;  Laterality: N/A;  ? SKIN GRAFT    ? TONSILLECTOMY AND ADENOIDECTOMY    ? ?Past Medical History:  ?Diagnosis Date  ? Anxiety 01/19/2020  ? Arthritis   ? Asthma   ? Benign prostatic hyperplasia with weak urinary stream 12/08/2019  ? BPH (benign prostatic hyperplasia) 08/17/2020  ? Chronic  respiratory failure with hypoxia ([place]) 01/24/2020  ? Has home 02 but not using as of 01/24/2020  -  01/24/2020   Walked RA  approx   200 ft  @ slow pace  stopped due to  Dizzy with sats still 95%     ? Cigarette smoker 01/24/2020  ? Stopped regular smoking 10/2019 but still "now and then" as of 01/24/2020   ? COPD (chronic obstructive pulmonary disease) ([place]) 08/17/2020  ? gold  ? COPD GOLD ?    ? Quit smoking 10/2019  - Labs ordered 01/24/2020  :  allergy profile   alpha one AT phenotype   - 01/24/2020  After extensive coaching inhaler device,  effectiveness =    75% (short Ti) > change symb 80 to 160 2bid     ? Coronary artery disease 06/08/2018  ? DES to RCA, [facility], [place])  ? Depression, major, single episode, moderate ([place]) 01/19/2020  ? Dyspnea   ? Encounter for screening for malignant neoplasm of colon 12/08/2019  ? Essential hypertension 12/08/2019  ? History of MI (myocardial infarction) 01/19/2020  ? HTN (hypertension) 08/17/2020  ? Myocardial infarct [facility]) 2019  ? Pneumonia   ? ?There were no vitals taken for this visit. ? ?Opioid Risk Score:   ?Fall Risk Score:  `1 ? ?Depression screen PHQ 2/9 ? ? ?  08/27/2021  ?  3:48 PM 07/03/2021  ?  1:06 PM 04/10/2021  ?  1:02 PM 02/05/2021  ? 12:56 PM 12/14/2020  ? 10:29 AM 11/15/2020  ?  2:34 PM 11/05/2020  ? 10:58 AM  ?Depression screen PHQ 2/9  ?Decreased Interest 0 0 1 0 0 1 0  ?Down, Depressed, Hopeless 0 0 1 1 0 2 3  ?PHQ - 2 Score 0 0 2 1 0 3 3  ?Altered sleeping 0   1 0 0 0  ?Tired, decreased energy 0   1 0 3 3  ?Change in appetite 0   0 0 0 0  ?Feeling bad or failure about yourself  0   0 0 0 0  ?Trouble concentrating 0   0 0 0 0  ?Moving slowly or fidgety/restless 0   0 0 0 0  ?Suicidal thoughts 0   0 0 0 3  ?PHQ-9 Score 0   3 0 6 9  ?Difficult doing work/chores Not difficult at all   Not difficult at all Not difficult at all  Very difficult  ?  ? ? ?Review of Systems ? ?   ?Objective:  ? Physical Exam ? ? ? ? ?   ?Assessment & Plan:  ? ? ?

## 2021-08-29 ENCOUNTER — Telehealth: Payer: Self-pay | Admitting: *Deleted

## 2021-08-29 ENCOUNTER — Other Ambulatory Visit: Payer: Self-pay | Admitting: Physical Medicine and Rehabilitation

## 2021-08-29 DIAGNOSIS — G894 Chronic pain syndrome: Secondary | ICD-10-CM

## 2021-08-29 DIAGNOSIS — S22000G Wedge compression fracture of unspecified thoracic vertebra, subsequent encounter for fracture with delayed healing: Secondary | ICD-10-CM

## 2021-08-29 MED ORDER — OXYCODONE HCL 5 MG PO TABS: 5.0000 mg | ORAL_TABLET | ORAL | 0 refills | Status: DC | PRN

## 2021-08-29 NOTE — Telephone Encounter (Signed)
Mrs Geoffry Paradise called and said Charles Weeks missed his appt because of her yesterday. It has been rescheduled for 09/03/21 with Zella Ball. He will be out of his oxycodone IR tomorrow.  ?

## 2021-09-03 ENCOUNTER — Encounter: Payer: Self-pay | Admitting: Registered Nurse

## 2021-09-03 ENCOUNTER — Other Ambulatory Visit: Payer: Self-pay | Admitting: Registered Nurse

## 2021-09-03 ENCOUNTER — Encounter: Payer: Medicaid Other | Admitting: Registered Nurse

## 2021-09-03 VITALS — BP 147/87 | HR 102 | Ht 68.5 in | Wt 195.0 lb

## 2021-09-03 DIAGNOSIS — S22008S Other fracture of unspecified thoracic vertebra, sequela: Secondary | ICD-10-CM | POA: Diagnosis present

## 2021-09-03 DIAGNOSIS — G894 Chronic pain syndrome: Secondary | ICD-10-CM

## 2021-09-03 DIAGNOSIS — S22000G Wedge compression fracture of unspecified thoracic vertebra, subsequent encounter for fracture with delayed healing: Secondary | ICD-10-CM

## 2021-09-03 DIAGNOSIS — Z5181 Encounter for therapeutic drug level monitoring: Secondary | ICD-10-CM

## 2021-09-03 DIAGNOSIS — Z930 Tracheostomy status: Secondary | ICD-10-CM

## 2021-09-03 DIAGNOSIS — J386 Stenosis of larynx: Secondary | ICD-10-CM | POA: Diagnosis present

## 2021-09-03 DIAGNOSIS — Z79891 Long term (current) use of opiate analgesic: Secondary | ICD-10-CM

## 2021-09-03 MED ORDER — OXYCODONE HCL 5 MG PO TABS: 5.0000 mg | ORAL_TABLET | Freq: Four times a day (QID) | ORAL | 0 refills | Status: DC | PRN

## 2021-09-03 MED ORDER — XTAMPZA ER 9 MG PO C12A: 9.0000 mg | EXTENDED_RELEASE_CAPSULE | Freq: Two times a day (BID) | ORAL | 0 refills | Status: DC

## 2021-09-03 NOTE — Progress Notes (Signed)
? ?Subjective:  ? ? Patient ID: Charles Weeks, male    DOB: 08/06/1965, 56 y.o.   MRN: [id number] ? ?HPI: Charles Weeks is a 56 y.o. male who returns for follow up appointment for chronic pain and medication refill. He states his pain is located in his neck, upper- lower back. Charles Weeks sister states Charles Weeks pain is not being controlled with his current medication regimen, she also states Dr Naaman Plummer had discussed with starting the [facility] and increasing the dose, Dr Philomena Course note was reviewed. Dr Naaman Plummer is on vacation this week, we will continue current dose Xtampza and the above will be discussed with Dr Naaman Plummer, they verbalizes understanding. This provider asked Charles Weeks again where is his main, he pointed to his mid- lower back. He rates his pain 8. His current exercise regime is walking . ? ?Charles Weeks equivalent is 72.00 MME.   UDS ordered today.  ?  ? ?Pain Inventory ?Average Pain 9 ?Pain Right Now 8 ?My pain is constant, dull, stabbing, and aching ? ?In the last 24 hours, has pain interfered with the following? ?General activity 10 ?Relation with others 9 ?Enjoyment of life 9 ?What TIME of day is your pain at its worst? morning , evening, and night ?Sleep (in general) Fair ? ?Pain is worse with: walking, bending, sitting, inactivity, standing, and some activites ?Pain improves with: medication ?Relief from Meds: 5 ? ?Family History  ?Problem Relation Age of Onset  ? Heart disease Mother   ? Heart disease Father   ? Diabetes Sister   ? ?Social History  ? ?Socioeconomic History  ? Marital status: Single  ?  Spouse name: Not on file  ? Number of children: 2  ? Years of education: Not on file  ? Highest education level: 7th grade  ?Occupational History  ? Not on file  ?Tobacco Use  ? Smoking status: Former  ?  Packs/day: 1.50  ?  Types: Cigarettes  ?  Quit date: 08/17/2020  ?  Years since quitting: 1.0  ? Smokeless tobacco: Never  ?Vaping Use  ? Vaping Use: Never used  ?Substance and Sexual  Activity  ? Alcohol use: Not Currently  ? Drug use: Not Currently  ? Sexual activity: Not on file  ?Other Topics Concern  ? Not on file  ?Social History Narrative  ? ** Merged History Encounter **  ?    ? Lives with sister  ?Both daughters live close by  ? ?Sister has dog  ? ?Enjoys: shooting pool, fishing, Retail banker ? ?Diet: eats all food groups ?Caffeine: pop-dr pepper 2 liter  ?Water: 4-6 cups  ? ?Wears seat belt  ?No driving for him ?Smoke detectors  ?Fi  ? re Extinguisher ?No weapons   ? ? ?  ? ?Social Determinants of Health  ? ?Financial Resource Strain: Not on file  ?Food Insecurity: Not on file  ?Transportation Needs: Not on file  ?Physical Activity: Not on file  ?Stress: Not on file  ?Social Connections: Not on file  ? ?Past Surgical History:  ?Procedure Laterality Date  ? BALLOON DILATION N/A 02/06/2021  ? Procedure: BALLOON DILATION;  Surgeon: Melida Quitter, MD;  Location: [place];  Service: ENT;  Laterality: N/A;  ? BOWEL RESECTION  2006  ? MICROLARYNGOSCOPY WITH LASER AND BALLOON DILATION N/A 02/06/2021  ? Procedure: SUSPENDED MICRO DIRECT LARYNGOSCOPY;  Surgeon: Melida Quitter, MD;  Location: [place];  Service: ENT;  Laterality: N/A;  ? PEG PLACEMENT N/A 09/03/2020  ? Procedure: PERCUTANEOUS  ENDOSCOPIC GASTROSTOMY (PEG) PLACEMENT;  Surgeon: Georganna Skeans, MD;  Location: [place];  Service: General;  Laterality: N/A;  ? PERCUTANEOUS TRACHEOSTOMY N/A 09/03/2020  ? Procedure: PERCUTANEOUS TRACHEOSTOMY USING 6 SHILEY;  Surgeon: Georganna Skeans, MD;  Location: [place];  Service: General;  Laterality: N/A;  ? SKIN GRAFT    ? TONSILLECTOMY AND ADENOIDECTOMY    ? ?Past Surgical History:  ?Procedure Laterality Date  ? BALLOON DILATION N/A 02/06/2021  ? Procedure: BALLOON DILATION;  Surgeon: Melida Quitter, MD;  Location: [place];  Service: ENT;  Laterality: N/A;  ? BOWEL RESECTION  2006  ? MICROLARYNGOSCOPY WITH LASER AND BALLOON DILATION N/A 02/06/2021  ? Procedure: SUSPENDED MICRO DIRECT LARYNGOSCOPY;  Surgeon: Melida Quitter,  MD;  Location: [place];  Service: ENT;  Laterality: N/A;  ? PEG PLACEMENT N/A 09/03/2020  ? Procedure: PERCUTANEOUS ENDOSCOPIC GASTROSTOMY (PEG) PLACEMENT;  Surgeon: Georganna Skeans, MD;  Location: [place];  Service: General;  Laterality: N/A;  ? PERCUTANEOUS TRACHEOSTOMY N/A 09/03/2020  ? Procedure: PERCUTANEOUS TRACHEOSTOMY USING 6 SHILEY;  Surgeon: Georganna Skeans, MD;  Location: [place];  Service: General;  Laterality: N/A;  ? SKIN GRAFT    ? TONSILLECTOMY AND ADENOIDECTOMY    ? ?Past Medical History:  ?Diagnosis Date  ? Anxiety 01/19/2020  ? Arthritis   ? Asthma   ? Benign prostatic hyperplasia with weak urinary stream 12/08/2019  ? BPH (benign prostatic hyperplasia) 08/17/2020  ? Chronic respiratory failure with hypoxia ([place]) 01/24/2020  ? Has home 02 but not using as of 01/24/2020  -  01/24/2020   Walked RA  approx   200 ft  @ slow pace  stopped due to  Dizzy with sats still 95%     ? Cigarette smoker 01/24/2020  ? Stopped regular smoking 10/2019 but still "now and then" as of 01/24/2020   ? COPD (chronic obstructive pulmonary disease) ([place]) 08/17/2020  ? gold  ? COPD GOLD ?    ? Quit smoking 10/2019  - Labs ordered 01/24/2020  :  allergy profile   alpha one AT phenotype   - 01/24/2020  After extensive coaching inhaler device,  effectiveness =    75% (short Ti) > change symb 80 to 160 2bid     ? Coronary artery disease 06/08/2018  ? DES to RCA, [facility] [place], [place])  ? Depression, major, single episode, moderate ([place]) 01/19/2020  ? Dyspnea   ? Encounter for screening for malignant neoplasm of colon 12/08/2019  ? Essential hypertension 12/08/2019  ? History of MI (myocardial infarction) 01/19/2020  ? HTN (hypertension) 08/17/2020  ? Myocardial infarct [facility]) 2019  ? Pneumonia   ? ?BP (!) 147/87   Pulse (!) 102   Ht 5' 8.5" (1.74 m)   Wt 195 lb (88.5 kg)   SpO2 96% Comment: 2 L [place]  BMI 29.22 kg/m?  ? ?Opioid Risk Score:   ?Fall Risk Score:  `1 ? ?Depression screen PHQ 2/9 ? ? ?  09/03/2021  ?  1:50 PM 08/27/2021   ?  3:48 PM 07/03/2021  ?  1:06 PM 04/10/2021  ?  1:02 PM 02/05/2021  ? 12:56 PM 12/14/2020  ? 10:29 AM 11/15/2020  ?  2:34 PM  ?Depression screen PHQ 2/9  ?Decreased Interest 0 0 0 1 0 0 1  ?Down, Depressed, Hopeless 0 0 0 1 1 0 2  ?PHQ - 2 Score 0 0 0 2 1 0 3  ?Altered sleeping  0   1 0 0  ?Tired, decreased  energy  0   1 0 3  ?Change in appetite  0   0 0 0  ?Feeling bad or failure about yourself   0   0 0 0  ?Trouble concentrating  0   0 0 0  ?Moving slowly or fidgety/restless  0   0 0 0  ?Suicidal thoughts  0   0 0 0  ?PHQ-9 Score  0   3 0 6  ?Difficult doing work/chores  Not difficult at all   Not difficult at all Not difficult at all   ?  ? ?Review of Systems  ?Constitutional: Negative.   ?HENT: Negative.    ?Eyes: Negative.   ?Respiratory:    ?     Trach  ?Cardiovascular: Negative.   ?Gastrointestinal: Negative.   ?Endocrine: Negative.   ?Genitourinary: Negative.   ?Musculoskeletal:  Positive for back pain.  ?Skin: Negative.   ?Allergic/Immunologic: Negative.   ?Neurological:  Positive for headaches.  ?Hematological: Negative.   ?Psychiatric/Behavioral: Negative.    ?All other systems reviewed and are negative. ? ?   ?Objective:  ? Physical Exam ?Vitals and nursing note reviewed.  ?Constitutional:   ?   Appearance: Normal appearance.  ?Neck:  ?   Comments: Cervical Paraspinal Tenderness: C-5-C-6 ?Cardiovascular:  ?   Rate and Rhythm: Normal rate and regular rhythm.  ?   Pulses: Normal pulses.  ?   Heart sounds: Normal heart sounds.  ?Pulmonary:  ?   Effort: Pulmonary effort is normal.  ?   Breath sounds: Normal breath sounds.  ?   Comments: Trach Collar with 2 Liters of Oxygen  ?Musculoskeletal:  ?   Cervical back: Normal range of motion and neck supple.  ?   Comments: Normal Muscle Bulk and Muscle Testing Reveals:  ?Upper Extremities: Full ROM and Muscle Strength  5/5 ?Right AC Joint Tenderness ?Thoracic Hypersensitivity: T-1- T-7  ?Lumbar Paraspinal Tenderness: L-3-L-5 ?Lower Extremities: Full ROM and Muscle  Strength 5/5 ?Bilateral Lower Extremities Flexion Produces pain into his Lumbar ?Arises from Table Slowly ?Narrow Based  Gait  ?   ?Skin: ?   General: Skin is warm and dry.  ?Neurological:  ?   Mental Status: He is

## 2021-09-06 ENCOUNTER — Other Ambulatory Visit: Payer: Self-pay | Admitting: *Deleted

## 2021-09-06 ENCOUNTER — Encounter: Payer: Self-pay | Admitting: Internal Medicine

## 2021-09-06 ENCOUNTER — Telehealth: Payer: Self-pay | Admitting: Internal Medicine

## 2021-09-06 DIAGNOSIS — L89309 Pressure ulcer of unspecified buttock, unspecified stage: Secondary | ICD-10-CM

## 2021-09-06 DIAGNOSIS — S22008S Other fracture of unspecified thoracic vertebra, sequela: Secondary | ICD-10-CM

## 2021-09-06 DIAGNOSIS — J9611 Chronic respiratory failure with hypoxia: Secondary | ICD-10-CM

## 2021-09-06 MED ORDER — TRANSPORT CHAIR MISC: 0 refills | Status: AC

## 2021-09-06 MED ORDER — UNABLE TO FIND: 0 refills | Status: DC

## 2021-09-06 NOTE — Telephone Encounter (Signed)
These were faxed to [facility] on 08-23-21  ?

## 2021-09-06 NOTE — Telephone Encounter (Signed)
POA called in on patient behalf, in regard to tele message on 4/6 for hospital bed , bedside table, and transport chair. ? ? ?Still needs supplies sent in to [facility] . ?Fax # [phone] ? ? ?Order for chair needs to specifically say transport chair. ? ?Can call POA back in regards. ?

## 2021-09-08 LAB — TOXASSURE SELECT,+ANTIDEPR,UR

## 2021-09-10 ENCOUNTER — Telehealth: Payer: Self-pay | Admitting: *Deleted

## 2021-09-10 NOTE — Telephone Encounter (Signed)
Urine drug screen for this encounter is consistent for prescribed medication 

## 2021-09-11 ENCOUNTER — Telehealth: Payer: Self-pay | Admitting: Registered Nurse

## 2021-09-11 MED ORDER — XTAMPZA ER 13.5 MG PO C12A: 1.0000 | EXTENDED_RELEASE_CAPSULE | Freq: Two times a day (BID) | ORAL | 0 refills | Status: DC

## 2021-09-11 NOTE — Telephone Encounter (Signed)
This provider spoke with Dr Riley Kill regarding Mr. Lenzen  and sister reports of his increase intensity of pain. We will increase his Xtampza to 13.5 mg every 12 hours and oxycodone will remain the same.  ?Placed a call to Ms. Chip Boer ( sister) regarding the above, she verbalizes understanding.  ?

## 2021-09-14 ENCOUNTER — Other Ambulatory Visit: Payer: Self-pay | Admitting: Physical Medicine & Rehabilitation

## 2021-09-16 ENCOUNTER — Other Ambulatory Visit: Payer: Self-pay | Admitting: Internal Medicine

## 2021-09-16 ENCOUNTER — Other Ambulatory Visit: Payer: Self-pay | Admitting: Physical Medicine & Rehabilitation

## 2021-09-16 ENCOUNTER — Other Ambulatory Visit: Payer: Self-pay | Admitting: Physician Assistant

## 2021-09-16 NOTE — Telephone Encounter (Signed)
PMP was Reviewed.  ?Placed a call to Ms. Chip Boer Mr. Kinser sister, she states he is taking his Clonazepam nightly.,  ?Clonazepam refilled today, she verbalizes understanding.  ?

## 2021-10-01 ENCOUNTER — Telehealth: Payer: Self-pay | Admitting: *Deleted

## 2021-10-01 DIAGNOSIS — G894 Chronic pain syndrome: Secondary | ICD-10-CM

## 2021-10-01 DIAGNOSIS — S22000G Wedge compression fracture of unspecified thoracic vertebra, subsequent encounter for fracture with delayed healing: Secondary | ICD-10-CM

## 2021-10-01 MED ORDER — XTAMPZA ER 13.5 MG PO C12A: 1.0000 | EXTENDED_RELEASE_CAPSULE | Freq: Two times a day (BID) | ORAL | 0 refills | Status: DC

## 2021-10-01 MED ORDER — OXYCODONE HCL 5 MG PO TABS: 5.0000 mg | ORAL_TABLET | Freq: Four times a day (QID) | ORAL | 0 refills | Status: DC | PRN

## 2021-10-01 NOTE — Telephone Encounter (Signed)
Rx's sent to pharm. 

## 2021-10-01 NOTE — Telephone Encounter (Signed)
Charles Weeks sister called to request a refill on Charles Weeks's pain medication.  The Oxy 5 mg IR is due 5/18 per [name]. Last fill dates are oxycodone 5 mg 09/03/21 and Xtampza ER 09/11/21. His next appt is 10/23/21 with Dr Riley Kill. ?

## 2021-10-02 ENCOUNTER — Telehealth: Payer: Self-pay | Admitting: *Deleted

## 2021-10-02 DIAGNOSIS — G894 Chronic pain syndrome: Secondary | ICD-10-CM

## 2021-10-02 DIAGNOSIS — S22000G Wedge compression fracture of unspecified thoracic vertebra, subsequent encounter for fracture with delayed healing: Secondary | ICD-10-CM

## 2021-10-02 MED ORDER — OXYCODONE HCL 5 MG PO TABS: 5.0000 mg | ORAL_TABLET | Freq: Four times a day (QID) | ORAL | 0 refills | Status: DC | PRN

## 2021-10-02 NOTE — Telephone Encounter (Signed)
DONE

## 2021-10-02 NOTE — Telephone Encounter (Signed)
Charles Weeks's pharmacy [facility] does not have oxycodone 5 mg tablets.  Please send to [facility] on [address] in [place] where they do have some at the moment. ?

## 2021-10-02 NOTE — Telephone Encounter (Signed)
Notified. 

## 2021-10-09 ENCOUNTER — Telehealth: Payer: Self-pay | Admitting: Internal Medicine

## 2021-10-09 ENCOUNTER — Telehealth: Payer: Self-pay

## 2021-10-09 MED ORDER — XTAMPZA ER 13.5 MG PO C12A: 1.0000 | EXTENDED_RELEASE_CAPSULE | Freq: Two times a day (BID) | ORAL | 0 refills | Status: DC

## 2021-10-09 NOTE — Telephone Encounter (Signed)
  Per Lynden Ang (Sister) Mr. Frampton will be out of Xtampza on 10/11/2021. Please send a refill.    Filled  Written  ID  Drug  QTY  Days  Prescriber  RX #  Dispenser  Refill  Daily Dose*  Pymt Type  PMP  10/03/2021 10/02/2021 2  Oxycodone Hcl (Ir) 5 Mg Tablet 120.00 30 Za Swa [id number] [facility] ([id number]) 0/0 30.00 MME Medicaid [place] 09/16/2021 09/16/2021 1  Clonazepam 0.5 Mg Tablet 30.00 30 Eu Tho [id number] Car (9744) 0/2 1.00 LME Medicaid [place] 09/11/2021 09/11/2021 1  Xtampza Er 13.5 Mg Capsule 60.00 30 Eu Tho [id number] Car ([id number]) 0/0 45.00 MME Medicaid [place]

## 2021-10-09 NOTE — Telephone Encounter (Signed)
PMP was Reviewed, Xtampza e-scribed today. He has a scheduled appointment with Dr Riley Kill next month.  Placed a call to [name] ( sister), regarding the above, she verbalizes understanding.

## 2021-10-10 ENCOUNTER — Telehealth: Payer: Self-pay | Admitting: *Deleted

## 2021-10-10 MED ORDER — ALBUTEROL SULFATE (2.5 MG/3ML) 0.083% IN NEBU: INHALATION_SOLUTION | RESPIRATORY_TRACT | 2 refills | Status: DC

## 2021-10-10 NOTE — Telephone Encounter (Signed)
Prior auth submitted to [place] via [facility].

## 2021-10-10 NOTE — Telephone Encounter (Signed)
Spoke with the pt's spouse and made aware I have refilled albuterol nebs  Nothing further needed

## 2021-10-10 NOTE — Telephone Encounter (Signed)
Request Reference Number: [id number]. XTAMPZA ER CAP 13.5MG  is approved through 01/10/2022. For further questions, call [employer] at [phone]. Pharmacy notified.

## 2021-10-23 ENCOUNTER — Encounter: Payer: Medicaid Other | Attending: Physical Medicine & Rehabilitation | Admitting: Physical Medicine & Rehabilitation

## 2021-10-23 ENCOUNTER — Encounter: Payer: Self-pay | Admitting: Physical Medicine & Rehabilitation

## 2021-10-23 VITALS — BP 126/87 | HR 87 | Ht 68.5 in

## 2021-10-23 DIAGNOSIS — R338 Other retention of urine: Secondary | ICD-10-CM | POA: Diagnosis not present

## 2021-10-23 DIAGNOSIS — F418 Other specified anxiety disorders: Secondary | ICD-10-CM | POA: Diagnosis present

## 2021-10-23 DIAGNOSIS — W19XXXA Unspecified fall, initial encounter: Secondary | ICD-10-CM | POA: Insufficient documentation

## 2021-10-23 DIAGNOSIS — S22008S Other fracture of unspecified thoracic vertebra, sequela: Secondary | ICD-10-CM

## 2021-10-23 DIAGNOSIS — J398 Other specified diseases of upper respiratory tract: Secondary | ICD-10-CM | POA: Insufficient documentation

## 2021-10-23 DIAGNOSIS — S22000G Wedge compression fracture of unspecified thoracic vertebra, subsequent encounter for fracture with delayed healing: Secondary | ICD-10-CM

## 2021-10-23 DIAGNOSIS — S22068A Other fracture of T7-T8 thoracic vertebra, initial encounter for closed fracture: Secondary | ICD-10-CM | POA: Diagnosis present

## 2021-10-23 DIAGNOSIS — N401 Enlarged prostate with lower urinary tract symptoms: Secondary | ICD-10-CM | POA: Diagnosis present

## 2021-10-23 DIAGNOSIS — G894 Chronic pain syndrome: Secondary | ICD-10-CM | POA: Diagnosis not present

## 2021-10-23 MED ORDER — OXYCODONE HCL 5 MG PO TABS
5.0000 mg | ORAL_TABLET | Freq: Four times a day (QID) | ORAL | 0 refills | Status: DC | PRN
Start: 2021-10-23 — End: 2021-11-01

## 2021-10-23 MED ORDER — XTAMPZA ER 13.5 MG PO C12A: 1.0000 | EXTENDED_RELEASE_CAPSULE | Freq: Two times a day (BID) | ORAL | 0 refills | Status: DC

## 2021-10-23 MED ORDER — QUETIAPINE FUMARATE 300 MG PO TABS: ORAL_TABLET | ORAL | 3 refills | Status: DC

## 2021-10-23 NOTE — Patient Instructions (Addendum)
PLEASE FEEL FREE TO CALL OUR OFFICE WITH ANY PROBLEMS OR QUESTIONS [phone])  SCOPOLAMINE PATC: 4 DAYS THEN 5 DAYS THEN 6 DAYS THEN STOP.

## 2021-10-23 NOTE — Progress Notes (Signed)
Subjective:    Patient ID: Charles Weeks, male    DOB: 1966-04-11, 56 y.o.   MRN: [id number]  HPI    Charles Weeks is here in follow up of his chronic pain and functional deficits. He has been getting outside a little more. He has a Optician, dispensing now. He has a drone he's been playing around with. He likes to go sit outside on the porch.  They're hoping to work up to trips to the park.   His sleep has been better after a recent period where he struggled.   He is currently on Xtampza 13 mg every 12 hours with oxycodone 5 mg every 6 hours as needed.  He seems to be tolerating this combination fairly well.  No sedation is reported.  His bowels have remained regular while on Colace.  His combination seems to have allowed him to do a bit more physically speaking and is allowed him to find some other interests in his life.  He remains on scopolamine patch for chronic nausea.  We had placed this while he was in the hospital because he was having significant nausea affecting every aspect of his movements.  His sister asked if we could stop this medication.  He continues to follow-up with pulmonary/trach clinic.  His sister is proficient in the care of his trach and Dr. Redmond Baseman follows from [place].  Pain Inventory Average Pain 7 Pain Right Now 7 My pain is constant, sharp, burning, dull, and aching  In the last 24 hours, has pain interfered with the following? General activity 8 Relation with others 8 Enjoyment of life 8 What TIME of day is your pain at its worst? morning  and night Sleep (in general) Fair  Pain is worse with: walking, bending, standing, and some activites Pain improves with: rest, pacing activities, and medication Relief from Meds: 6  Family History  Problem Relation Age of Onset   Heart disease Mother    Heart disease Father    Diabetes Sister    Social History   Socioeconomic History   Marital status: Single    Spouse name: Not on file   Number of children: 2    Years of education: Not on file   Highest education level: 7th grade  Occupational History   Not on file  Tobacco Use   Smoking status: Former    Packs/day: 1.50    Types: Cigarettes    Quit date: 08/17/2020    Years since quitting: 1.1   Smokeless tobacco: Never  Vaping Use   Vaping Use: Never used  Substance and Sexual Activity   Alcohol use: Not Currently   Drug use: Not Currently   Sexual activity: Not on file  Other Topics Concern   Not on file  Social History Narrative   ** Merged History Encounter **       Lives with sister  Both daughters live close by   Sister has dog   Enjoys: shooting pool, fishing, Retail banker  Diet: eats all food groups Caffeine: pop-dr pepper 2 liter  Water: 4-6 cups   Wears seat belt  No driving 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        Social Determinants of Radio broadcast assistant Strain: Not on file  Food Insecurity: Not on file  Transportation Needs: Not on file  Physical Activity: Not on file  Stress: Not on file  Social Connections: Not on file   Past Surgical History:  Procedure Laterality Date   BALLOON DILATION N/A 02/06/2021   Procedure: BALLOON DILATION;  Surgeon: Melida Quitter, MD;  Location: [place];  Service: ENT;  Laterality: N/A;   BOWEL RESECTION  2006   MICROLARYNGOSCOPY WITH LASER AND BALLOON DILATION N/A 02/06/2021   Procedure: SUSPENDED MICRO DIRECT LARYNGOSCOPY;  Surgeon: Melida Quitter, MD;  Location: [place];  Service: ENT;  Laterality: N/A;   PEG PLACEMENT N/A 09/03/2020   Procedure: PERCUTANEOUS ENDOSCOPIC GASTROSTOMY (PEG) PLACEMENT;  Surgeon: Georganna Skeans, MD;  Location: [place];  Service: General;  Laterality: N/A;   PERCUTANEOUS TRACHEOSTOMY N/A 09/03/2020   Procedure: PERCUTANEOUS TRACHEOSTOMY USING 6 SHILEY;  Surgeon: Georganna Skeans, MD;  Location: [place];  Service: General;  Laterality: N/A;   SKIN GRAFT     TONSILLECTOMY AND ADENOIDECTOMY     Past Surgical History:   Procedure Laterality Date   BALLOON DILATION N/A 02/06/2021   Procedure: BALLOON DILATION;  Surgeon: Melida Quitter, MD;  Location: [place];  Service: ENT;  Laterality: N/A;   BOWEL RESECTION  2006   MICROLARYNGOSCOPY WITH LASER AND BALLOON DILATION N/A 02/06/2021   Procedure: SUSPENDED MICRO DIRECT LARYNGOSCOPY;  Surgeon: Melida Quitter, MD;  Location: [place];  Service: ENT;  Laterality: N/A;   PEG PLACEMENT N/A 09/03/2020   Procedure: PERCUTANEOUS ENDOSCOPIC GASTROSTOMY (PEG) PLACEMENT;  Surgeon: Georganna Skeans, MD;  Location: [place];  Service: General;  Laterality: N/A;   PERCUTANEOUS TRACHEOSTOMY N/A 09/03/2020   Procedure: PERCUTANEOUS TRACHEOSTOMY USING 6 SHILEY;  Surgeon: Georganna Skeans, MD;  Location: [place];  Service: General;  Laterality: N/A;   SKIN GRAFT     TONSILLECTOMY AND ADENOIDECTOMY     Past Medical History:  Diagnosis Date   Anxiety 01/19/2020   Arthritis    Asthma    Benign prostatic hyperplasia with weak urinary stream 12/08/2019   BPH (benign prostatic hyperplasia) 08/17/2020   Chronic respiratory failure with hypoxia ([place]) 01/24/2020   Has home 02 but not using as of 01/24/2020  -  01/24/2020   Walked RA  approx   200 ft  @ slow pace  stopped due to  Dizzy with sats still 95%      Cigarette smoker 01/24/2020   Stopped regular smoking 10/2019 but still "now and then" as of 01/24/2020    COPD (chronic obstructive pulmonary disease) ([place]) 08/17/2020   gold   COPD GOLD ?     Quit smoking 10/2019  - Labs ordered 01/24/2020  :  allergy profile   alpha one AT phenotype   - 01/24/2020  After extensive coaching inhaler device,  effectiveness =    75% (short Ti) > change symb 80 to 160 2bid      Coronary artery disease 06/08/2018   DES to RCA, [facility] [place], [place])   Depression, major, single episode, moderate ([place]) 01/19/2020   Dyspnea    Encounter for screening for malignant neoplasm of colon 12/08/2019   Essential hypertension 12/08/2019   History of MI (myocardial  infarction) 01/19/2020   HTN (hypertension) 08/17/2020   Myocardial infarct ([place]) 2019   Pneumonia    BP 126/87   Pulse 87   Ht 5' 8.5" (1.74 m)   SpO2 93%   BMI 29.22 kg/m   Opioid Risk Score:   Fall Risk Score:  `1  Depression screen PHQ 2/9     10/23/2021    1:12 PM 09/03/2021    1:50 PM 08/27/2021    3:48 PM 07/03/2021    1:06 PM 04/10/2021  1:02 PM 02/05/2021   12:56 PM 12/14/2020   10:29 AM  Depression screen PHQ 2/9  Decreased Interest 2 0 0 0 1 0 0  Down, Depressed, Hopeless 2 0 0 0 1 1 0  PHQ - 2 Score 4 0 0 0 2 1 0  Altered sleeping   0   1 0  Tired, decreased energy   0   1 0  Change in appetite   0   0 0  Feeling bad or failure about yourself    0   0 0  Trouble concentrating   0   0 0  Moving slowly or fidgety/restless   0   0 0  Suicidal thoughts   0   0 0  PHQ-9 Score   0   3 0  Difficult doing work/chores   Not difficult at all   Not difficult at all Not difficult at all     Review of Systems  Constitutional: Negative.   HENT: Negative.    Eyes: Negative.   Respiratory: Negative.    Cardiovascular: Negative.   Gastrointestinal: Negative.   Endocrine: Negative.   Genitourinary: Negative.   Musculoskeletal:  Positive for back pain.  Skin: Negative.   Allergic/Immunologic: Negative.   Neurological:  Positive for headaches.  Hematological: Negative.   Psychiatric/Behavioral: Negative.        Objective:   Physical Exam General: No acute distress HEENT: NCAT, EOMI, oral membranes moist.  Cards: reg rate  Chest: normal effort Abdomen: Sof. , NT, ND Skin: dry, intact Extremities: no edema Psych: pleasant and appropriate .  He does appear more upbeat Skin: intact Neuro: Neurologically appears to be alert and oriented x3.  .  Strength is grossly 5 - to 5 5 out of 5 in all 4 limbs with no focal sensory findings.  Balance was fair to good.  was in transport chair Musculoskeletal: Significant back discomfort in the T4-T10 range remains..  He has  ongoing dextroscoliosis with rotation of the spine as well.  Appears to be more comfortable in sitting.        Assessment & Plan:  1.  Debility secondary to fall 08/17/20 with 3 column T8 vertebral body fracture/T7 neural arch fracture/right TVP T4-8/right C7 transverse process fracture through the foramen.   progressive disease on recent mri              -maintain HEP as much as he can              -hopefully they can have a CAPS worker out sooner than later 2.  . Pain Management:  -Given the increase in his pain and his diminishing mobility which is only exacerbating the situation, we will begin a trial of Xtampza 9 mg twice daily Continue Oxycodone 5mg  q6 prn, #120 to allow him 4 per day.   -continue xtampza 13mg  q12 hours scheduled #60.  -May take up to 2000 mg of Tylenol on a daily basis and 3000 occasionally.             - ibuprofen 800 mg twice daily as needed with food             -We will continue the controlled substance monitoring program, this consists of regular clinic visits, examinations, routine drug screening, pill counts as well as use of [place] Controlled Substance Reporting System. [place] was [place] today.  4. Mood: Continue Wellbutrin 300 mg daily, BuSpar 10 mg 3 times daily, Klonopin 0.5 mg nightly,              -Seroquel 150 mg twice daily and 300 mg nightly              -mood is improving .    5.  CAD/MI/DES.  Continue aspirin per cardiology.   6.  Acute hypercarbic ventilatory dependent respiratory failure/COPD.  Tracheostomy 09/03/2020 per Dr. Grandville Silos.   -ENT/pumonary  following.                   -  7.  BPH/urine retention:              -oob to void for all attempts              -Continue hyrtin, Flomax and Urecholine. voiding well.  8 Constipation---              softener to make sure he has extra long stay room            -needs to have bm at least qod     15 minutes of face to face patient care time were spent during  this visit. All questions were encouraged and answered. Follow up with in 2 months with nurse practitioner        Is  .  A separate cardioversion brain reasonable which.  I will we make any any progress with long-acting

## 2021-11-01 ENCOUNTER — Telehealth: Payer: Self-pay | Admitting: *Deleted

## 2021-11-01 DIAGNOSIS — S22000G Wedge compression fracture of unspecified thoracic vertebra, subsequent encounter for fracture with delayed healing: Secondary | ICD-10-CM

## 2021-11-01 DIAGNOSIS — G894 Chronic pain syndrome: Secondary | ICD-10-CM

## 2021-11-01 IMAGING — CT CT T SPINE W/O CM
3 of 4 series · 10 of 33 positions shown, 12 images · non-contrast
Comparison: CT chest 08/17/2020

CLINICAL DATA: Spinal fractures.  Fall.

EXAM:
CT THORACIC SPINE WITHOUT CONTRAST
TECHNIQUE: Multidetector CT images of the thoracic were obtained using the
standard protocol without intravenous contrast.

[Series 6: sag bone · sagittal · 0.38mm/px · 5 of 76 slices shown, 6 images]
[im 26/76  bone]
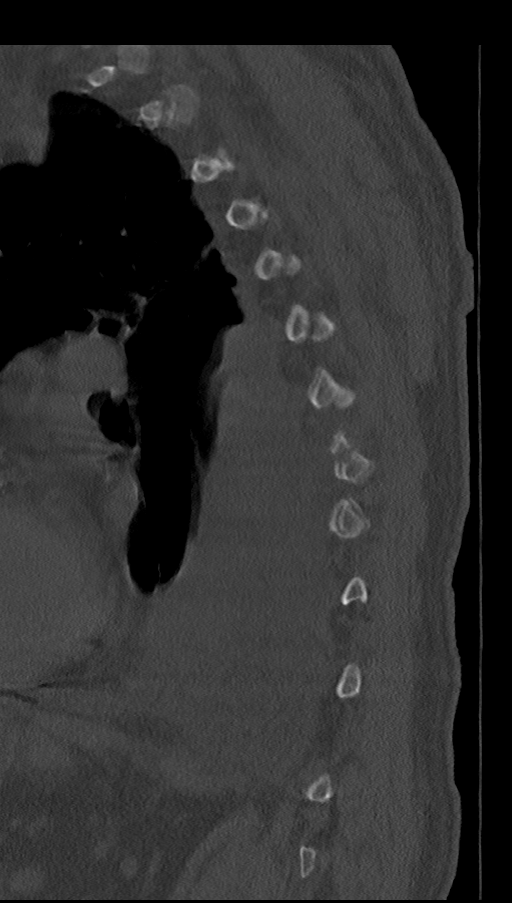
[im 32/76  bone]
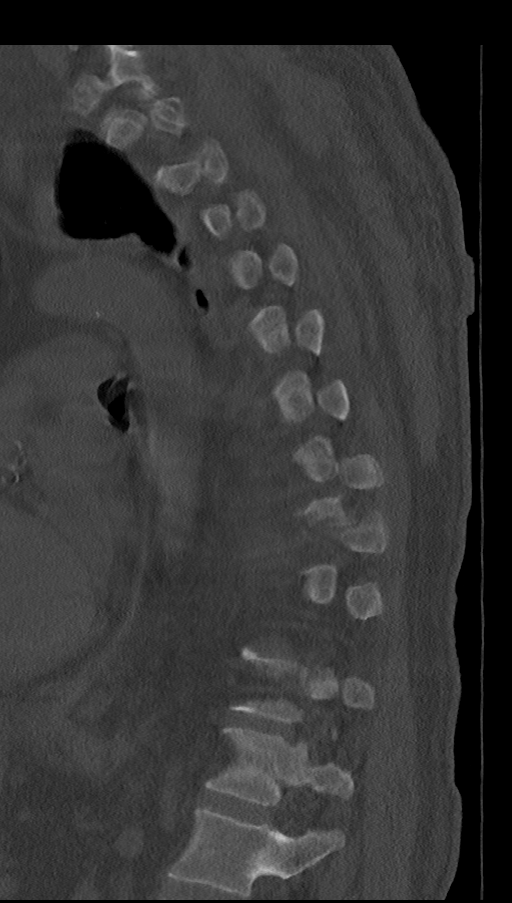
[im 38/76  soft-tissue]
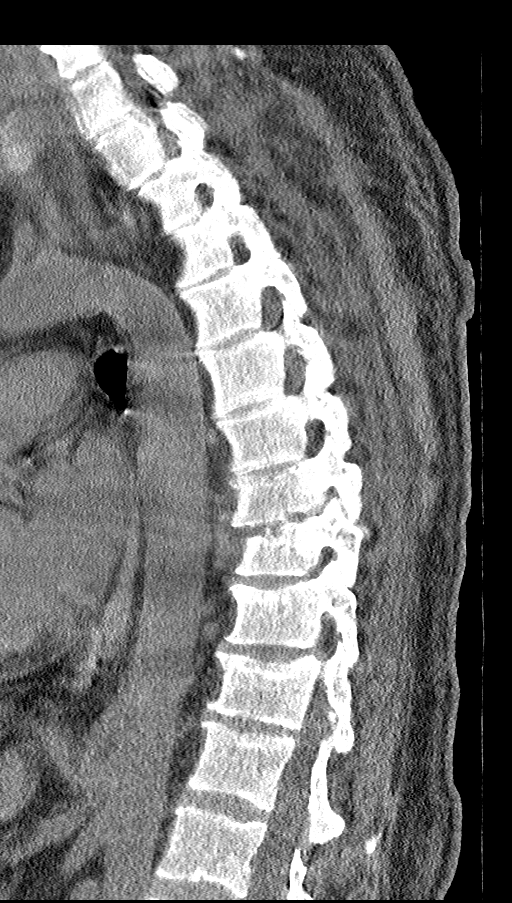
[im 38/76  bone]
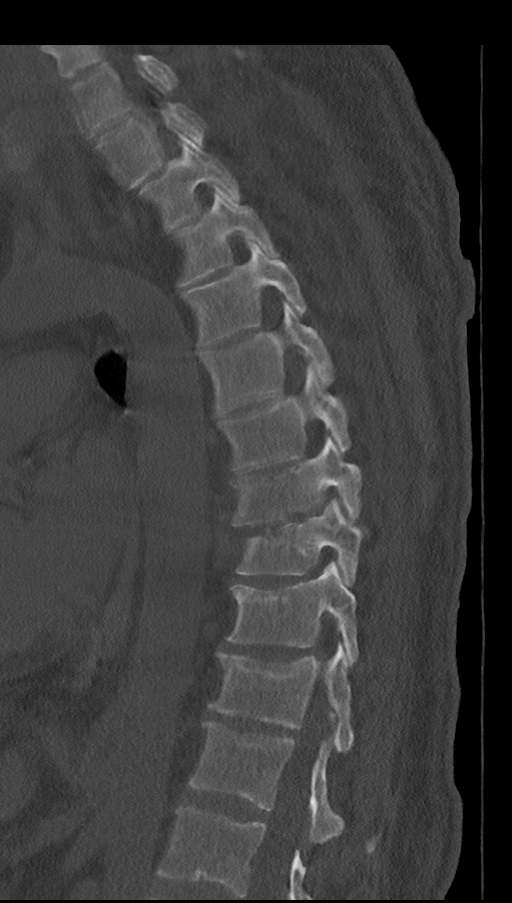
[im 44/76  bone]
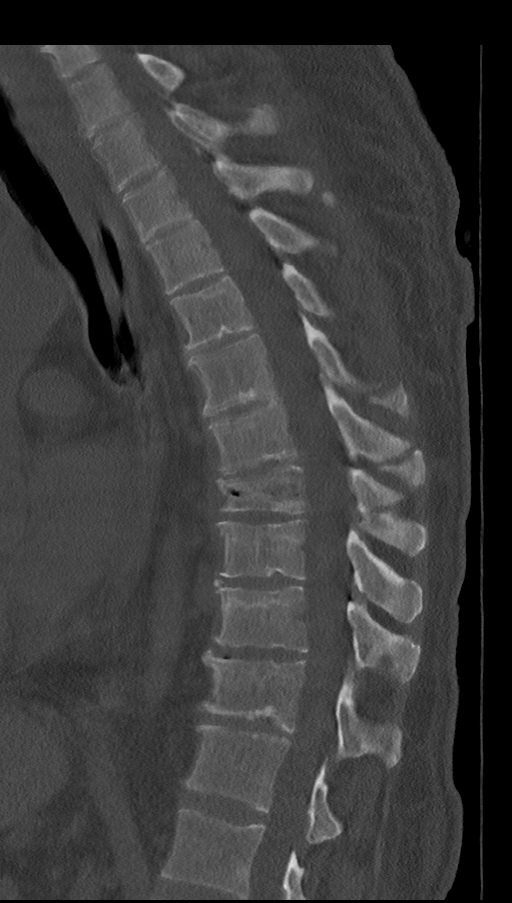
[im 51/76  bone]
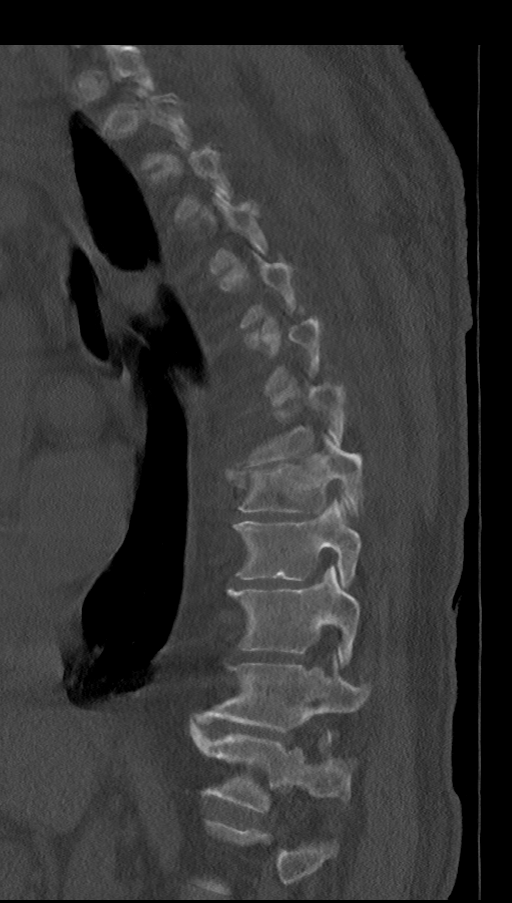

[Series 7: cor bone · coronal · 0.30mm/px · 3 of 92 slices shown]
[im 19/92  bone]
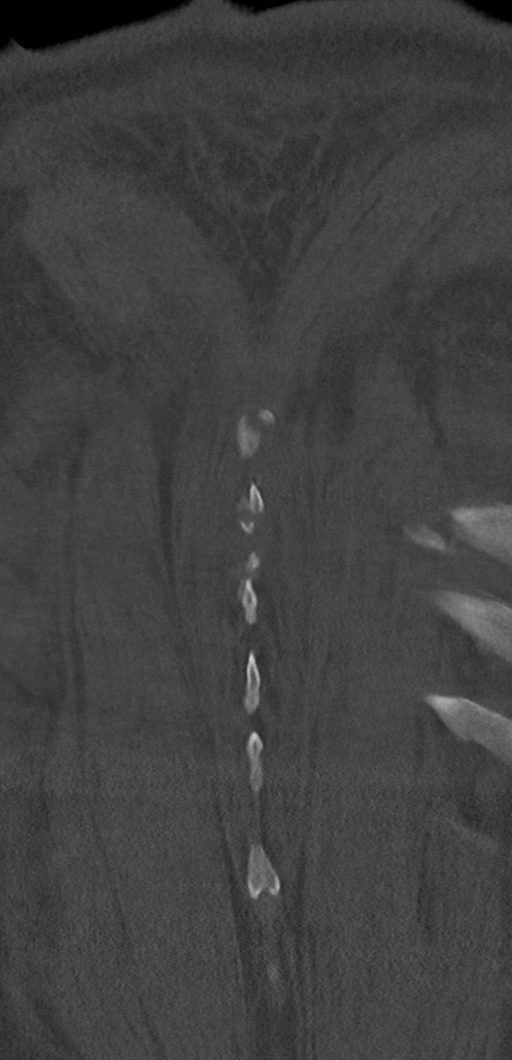
[im 37/92  bone]
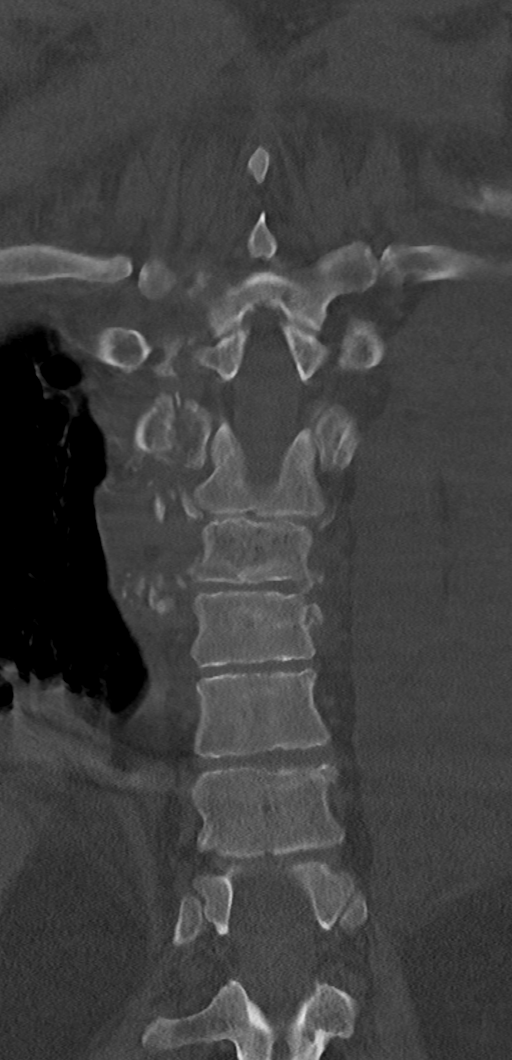
[im 55/92  bone]
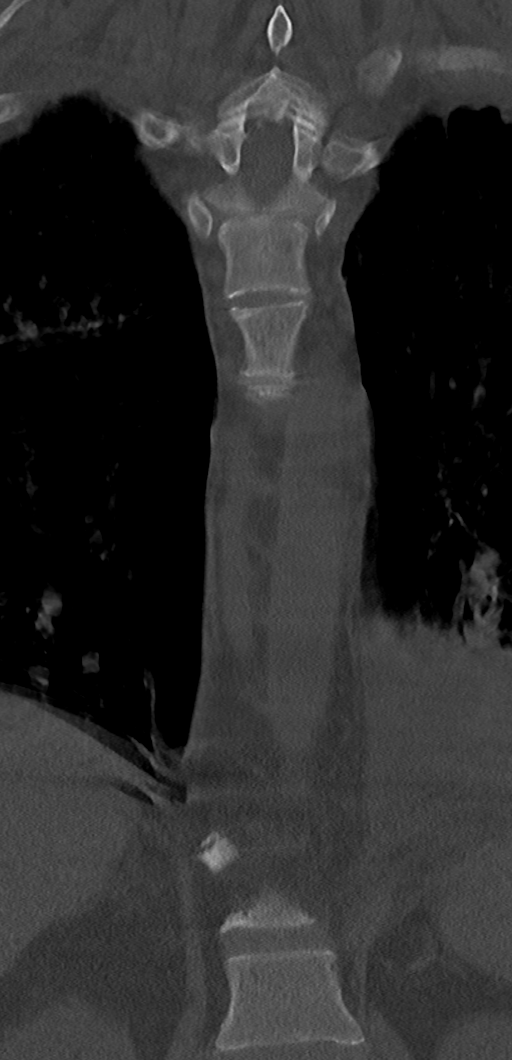

[Series 8: orthogonal bone · axial · 0.21mm/px · z∈[-332,-232]mm · 2 of 172 slices shown, 3 images]
[im 58/172  soft-tissue]
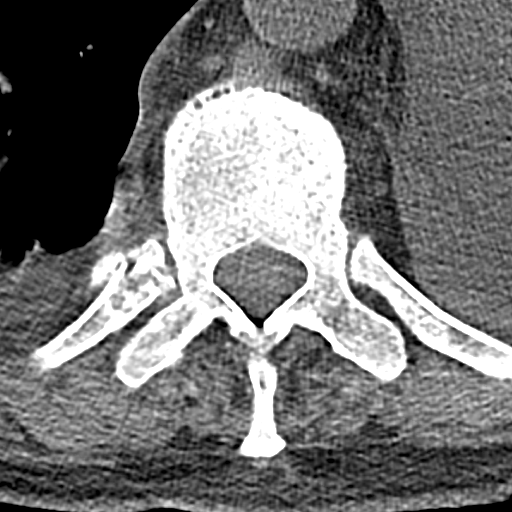
[im 58/172  bone]
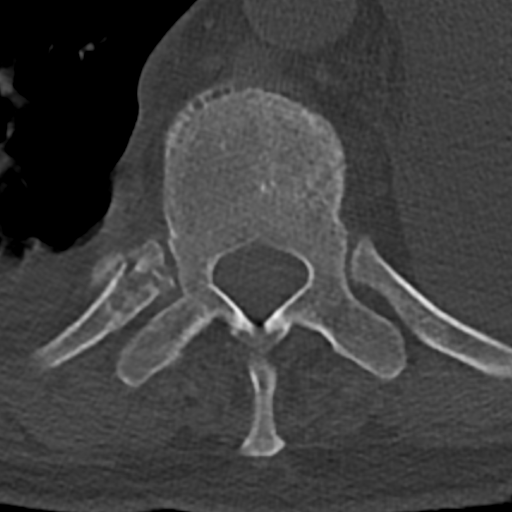
[im 115/172  bone]
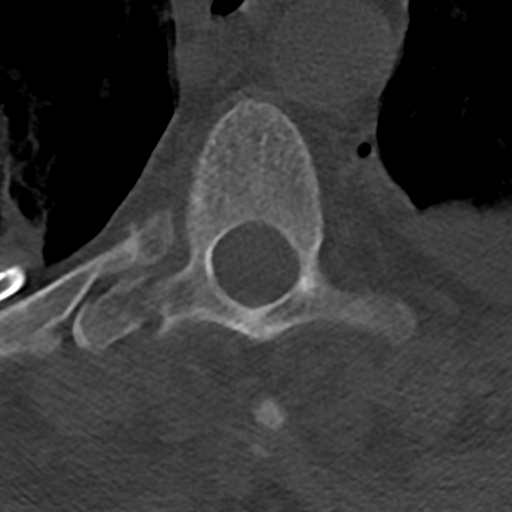

[10 of 33 positions shown; findings below may reference images not displayed]

FINDINGS: Alignment: Motion degraded study

Normal alignment.  Mild dextroscoliosis

Vertebrae: Numerous vertebral fractures and numerous rib fractures.

Fractures of the right third, fifth, sixth, seventh, eighth, ninth,
tenth ribs medially. Fracture of the right transverse process T3,
T4, T5, T6, T7, T8, T9

Fracture of the left seventh, eighth, ninth ribs medially.

Comminuted compression fracture of T8 left greater than right. No
retropulsion of bone into the canal. Fracture of the T9 vertebral
body on the left. Probable mild compression fracture superior
endplate of T10 and T11. Spinous process fractures of T6, T7, and T8

Paraspinal and other soft tissues: Mild to moderate loculated
pleural effusion on the left. Small right pleural effusion.
Bilateral chest tubes. No pneumothorax identified. Moderate
emphysema. Tracheostomy noted.

Disc levels: Multilevel disc degeneration. No significant spinal
stenosis.
IMPRESSION: Multiple rib fractures medially, right greater than left. Multiple
transverse process fractures on the right.

Compression fractures of T8 and T9 without significant spinal
stenosis. Probable mild superior endplate fractures of T10 and T11

Bilateral pleural effusions left greater than right.  Emphysema.

## 2021-11-01 IMAGING — DX DG CHEST 1V PORT
1 series · 1 of 1 positions shown · non-contrast
Comparison: 09/09/2020

CLINICAL DATA: Bilateral pleural effusions

EXAM:
PORTABLE CHEST 1 VIEW

[chest]
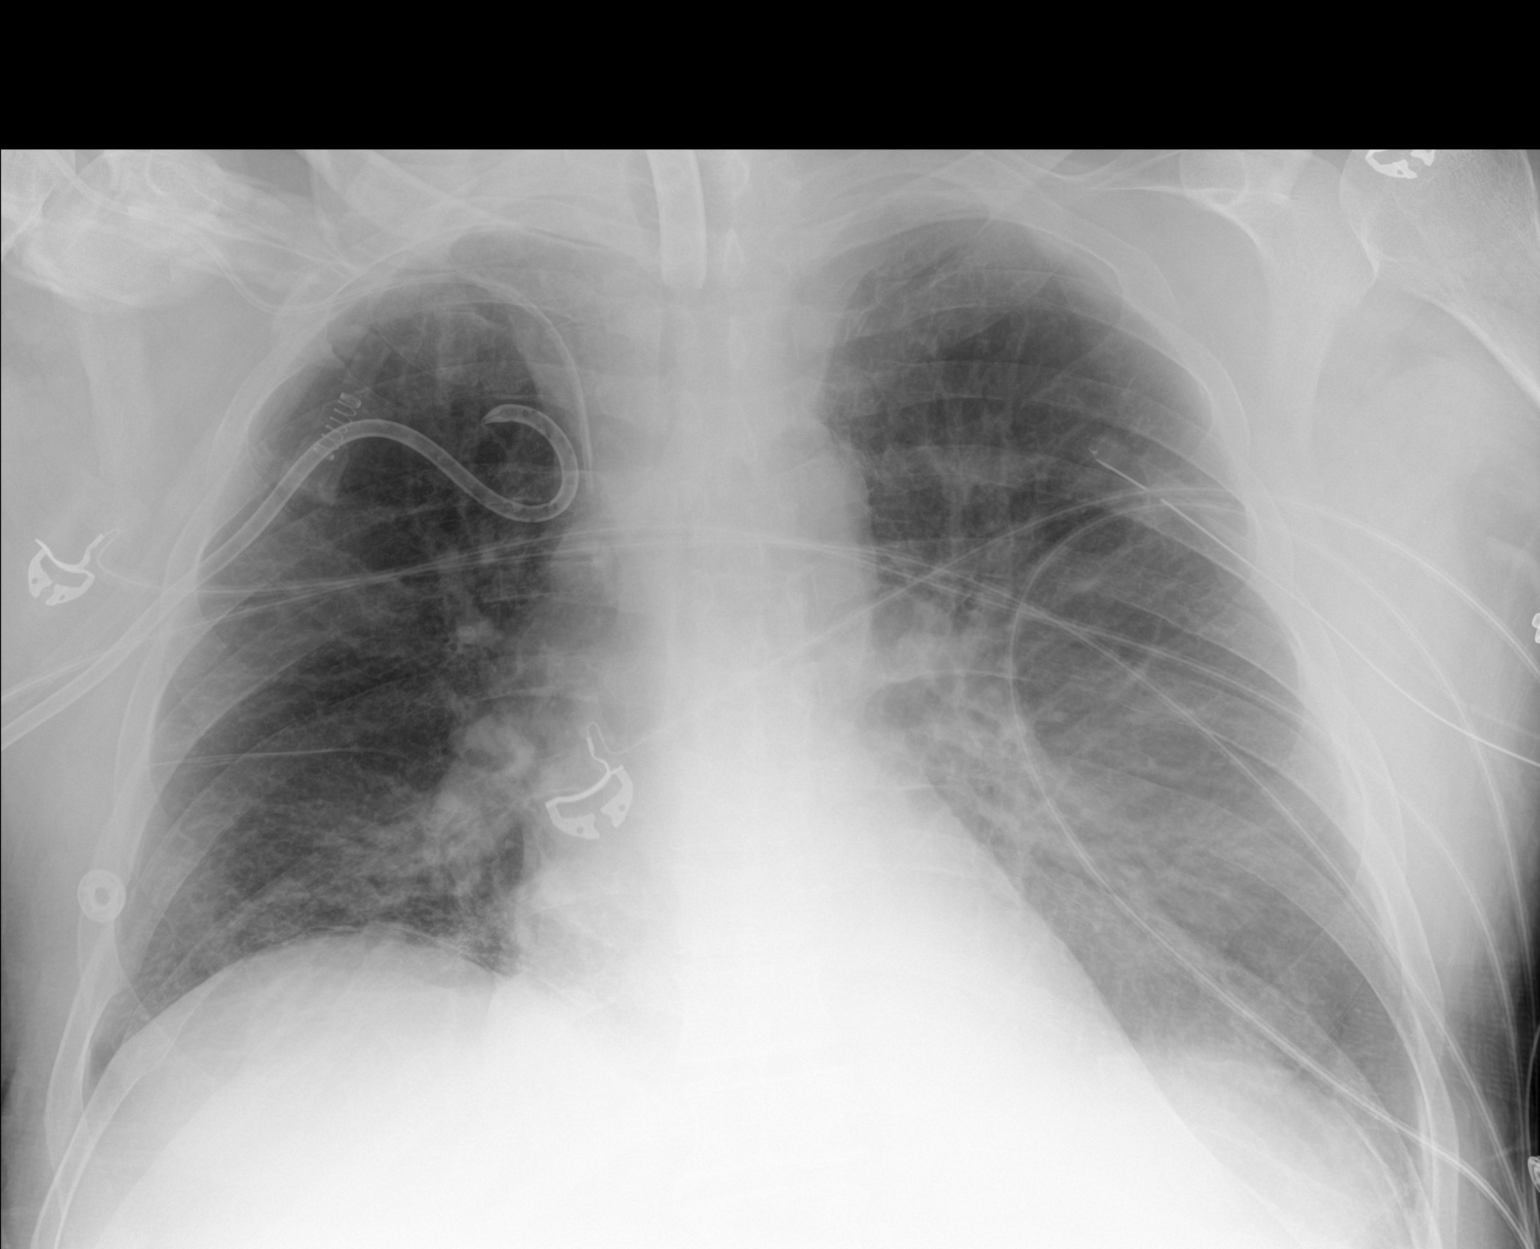

[1 of 1 positions shown; findings below may reference images not displayed]

FINDINGS: Tracheostomy, right subclavian central venous catheter with its tip
within the proximal superior vena cava, right pigtail chest tube,
and left large bore chest tube are all unchanged. Pulmonary
insufflation is stable. Superimposed bilateral pulmonary infiltrates
have significantly improved with minimal residual infiltrate noted
at the left lung base and left suprahilar region. No pneumothorax or
pleural effusion. Cardiac size within normal limits. Pulmonary
vascularity is normal.
IMPRESSION: Stable support lines and tubes.

Bilateral chest tubes in place.  No pneumothorax.

Improving bilateral pulmonary infiltrates

## 2021-11-01 MED ORDER — OXYCODONE HCL 5 MG PO TABS: 5.0000 mg | ORAL_TABLET | Freq: Four times a day (QID) | ORAL | 0 refills | Status: DC | PRN

## 2021-11-01 NOTE — Telephone Encounter (Signed)
Order sent.

## 2021-11-01 NOTE — Telephone Encounter (Signed)
Charles Weeks's sister called and reports his pharmacy does not have his oxycodone.  She has found it at [facility] on [address] in [place] and they have just enough.  Please send rx there. I have added pharmacy and canceled the old rx.

## 2021-11-12 ENCOUNTER — Telehealth: Payer: Self-pay

## 2021-11-12 ENCOUNTER — Other Ambulatory Visit: Payer: Self-pay | Admitting: Internal Medicine

## 2021-11-12 NOTE — Telephone Encounter (Signed)
PA for [facility] sent to [facility]. Approved 11/12/21-05/11/22

## 2021-11-13 ENCOUNTER — Other Ambulatory Visit: Payer: Self-pay | Admitting: Internal Medicine

## 2021-11-13 ENCOUNTER — Telehealth: Payer: Self-pay

## 2021-11-13 NOTE — Telephone Encounter (Signed)
PA submitted for Quetiapine through [employer]. Approval 11/13/21-11/13/22

## 2021-11-21 ENCOUNTER — Emergency Department (HOSPITAL_COMMUNITY): Payer: Medicaid Other

## 2021-11-21 ENCOUNTER — Other Ambulatory Visit: Payer: Self-pay

## 2021-11-21 ENCOUNTER — Inpatient Hospital Stay (HOSPITAL_COMMUNITY)
Admission: EM | Admit: 2021-11-21 | Discharge: 2021-11-28 | DRG: 871 | Disposition: A | Discharge status: Home / Self Care | Payer: Medicaid Other | Attending: Family Medicine | Admitting: Family Medicine

## 2021-11-21 ENCOUNTER — Encounter (HOSPITAL_COMMUNITY): Payer: Self-pay

## 2021-11-21 DIAGNOSIS — R1110 Vomiting, unspecified: Secondary | ICD-10-CM

## 2021-11-21 DIAGNOSIS — A419 Sepsis, unspecified organism: Principal | ICD-10-CM | POA: Diagnosis present

## 2021-11-21 DIAGNOSIS — K219 Gastro-esophageal reflux disease without esophagitis: Secondary | ICD-10-CM

## 2021-11-21 DIAGNOSIS — Z8249 Family history of ischemic heart disease and other diseases of the circulatory system: Secondary | ICD-10-CM

## 2021-11-21 DIAGNOSIS — F419 Anxiety disorder, unspecified: Secondary | ICD-10-CM | POA: Diagnosis present

## 2021-11-21 DIAGNOSIS — I2510 Atherosclerotic heart disease of native coronary artery without angina pectoris: Secondary | ICD-10-CM | POA: Diagnosis present

## 2021-11-21 DIAGNOSIS — I252 Old myocardial infarction: Secondary | ICD-10-CM

## 2021-11-21 DIAGNOSIS — J9602 Acute respiratory failure with hypercapnia: Secondary | ICD-10-CM

## 2021-11-21 DIAGNOSIS — J449 Chronic obstructive pulmonary disease, unspecified: Secondary | ICD-10-CM

## 2021-11-21 DIAGNOSIS — E860 Dehydration: Secondary | ICD-10-CM | POA: Diagnosis present

## 2021-11-21 DIAGNOSIS — E785 Hyperlipidemia, unspecified: Secondary | ICD-10-CM

## 2021-11-21 DIAGNOSIS — F32A Depression, unspecified: Secondary | ICD-10-CM | POA: Diagnosis present

## 2021-11-21 DIAGNOSIS — R6510 Systemic inflammatory response syndrome (SIRS) of non-infectious origin without acute organ dysfunction: Secondary | ICD-10-CM | POA: Diagnosis present

## 2021-11-21 DIAGNOSIS — E876 Hypokalemia: Secondary | ICD-10-CM | POA: Diagnosis present

## 2021-11-21 DIAGNOSIS — Z7982 Long term (current) use of aspirin: Secondary | ICD-10-CM

## 2021-11-21 DIAGNOSIS — G8929 Other chronic pain: Secondary | ICD-10-CM

## 2021-11-21 DIAGNOSIS — Y92230 Patient room in hospital as the place of occurrence of the external cause: Secondary | ICD-10-CM | POA: Diagnosis not present

## 2021-11-21 DIAGNOSIS — N401 Enlarged prostate with lower urinary tract symptoms: Secondary | ICD-10-CM | POA: Diagnosis present

## 2021-11-21 DIAGNOSIS — J9622 Acute and chronic respiratory failure with hypercapnia: Secondary | ICD-10-CM | POA: Diagnosis present

## 2021-11-21 DIAGNOSIS — Z9981 Dependence on supplemental oxygen: Secondary | ICD-10-CM

## 2021-11-21 DIAGNOSIS — J9621 Acute and chronic respiratory failure with hypoxia: Secondary | ICD-10-CM | POA: Diagnosis present

## 2021-11-21 DIAGNOSIS — K56609 Unspecified intestinal obstruction, unspecified as to partial versus complete obstruction: Secondary | ICD-10-CM

## 2021-11-21 DIAGNOSIS — Z79891 Long term (current) use of opiate analgesic: Secondary | ICD-10-CM

## 2021-11-21 DIAGNOSIS — T368X5A Adverse effect of other systemic antibiotics, initial encounter: Secondary | ICD-10-CM | POA: Diagnosis not present

## 2021-11-21 DIAGNOSIS — Z930 Tracheostomy status: Secondary | ICD-10-CM

## 2021-11-21 DIAGNOSIS — R7303 Prediabetes: Secondary | ICD-10-CM | POA: Diagnosis present

## 2021-11-21 DIAGNOSIS — L270 Generalized skin eruption due to drugs and medicaments taken internally: Secondary | ICD-10-CM | POA: Diagnosis not present

## 2021-11-21 DIAGNOSIS — Z87891 Personal history of nicotine dependence: Secondary | ICD-10-CM

## 2021-11-21 DIAGNOSIS — N179 Acute kidney failure, unspecified: Secondary | ICD-10-CM | POA: Diagnosis present

## 2021-11-21 DIAGNOSIS — Z833 Family history of diabetes mellitus: Secondary | ICD-10-CM

## 2021-11-21 DIAGNOSIS — Z9049 Acquired absence of other specified parts of digestive tract: Secondary | ICD-10-CM

## 2021-11-21 DIAGNOSIS — M1990 Unspecified osteoarthritis, unspecified site: Secondary | ICD-10-CM | POA: Diagnosis present

## 2021-11-21 DIAGNOSIS — Z79899 Other long term (current) drug therapy: Secondary | ICD-10-CM

## 2021-11-21 DIAGNOSIS — F418 Other specified anxiety disorders: Secondary | ICD-10-CM | POA: Diagnosis present

## 2021-11-21 DIAGNOSIS — Z8701 Personal history of pneumonia (recurrent): Secondary | ICD-10-CM

## 2021-11-21 DIAGNOSIS — Z7951 Long term (current) use of inhaled steroids: Secondary | ICD-10-CM

## 2021-11-21 DIAGNOSIS — G894 Chronic pain syndrome: Secondary | ICD-10-CM | POA: Diagnosis present

## 2021-11-21 DIAGNOSIS — K567 Ileus, unspecified: Secondary | ICD-10-CM

## 2021-11-21 DIAGNOSIS — K922 Gastrointestinal hemorrhage, unspecified: Secondary | ICD-10-CM

## 2021-11-21 DIAGNOSIS — R338 Other retention of urine: Secondary | ICD-10-CM | POA: Diagnosis present

## 2021-11-21 DIAGNOSIS — F329 Major depressive disorder, single episode, unspecified: Secondary | ICD-10-CM | POA: Diagnosis present

## 2021-11-21 DIAGNOSIS — K529 Noninfective gastroenteritis and colitis, unspecified: Secondary | ICD-10-CM

## 2021-11-21 DIAGNOSIS — D649 Anemia, unspecified: Secondary | ICD-10-CM

## 2021-11-21 DIAGNOSIS — I10 Essential (primary) hypertension: Secondary | ICD-10-CM | POA: Diagnosis present

## 2021-11-21 LAB — COMPREHENSIVE METABOLIC PANEL
ALT: 27 U/L (ref 0–44)
AST: 25 U/L (ref 15–41)
Albumin: 4.6 g/dL (ref 3.5–5.0)
Alkaline Phosphatase: 143 U/L — ABNORMAL HIGH (ref 38–126)
Anion gap: 12 (ref 5–15)
BUN: 15 mg/dL (ref 6–20)
CO2: 28 mmol/L (ref 22–32)
Calcium: 9.4 mg/dL (ref 8.9–10.3)
Chloride: 101 mmol/L (ref 98–111)
Creatinine, Ser: 1.41 mg/dL — ABNORMAL HIGH (ref 0.61–1.24)
GFR, Estimated: 58 mL/min — ABNORMAL LOW (ref >60)
Glucose, Bld: 252 mg/dL — ABNORMAL HIGH (ref 70–99)
Potassium: 4.2 mmol/L (ref 3.5–5.1)
Sodium: 141 mmol/L (ref 135–145)
Total Bilirubin: 0.6 mg/dL (ref 0.3–1.2)
Total Protein: 8 g/dL (ref 6.5–8.1)

## 2021-11-21 LAB — BLOOD GAS, VENOUS
Acid-Base Excess: 2.3 mmol/L — ABNORMAL HIGH (ref 0.0–2.0)
Bicarbonate: 32.5 mmol/L — ABNORMAL HIGH (ref 20.0–28.0)
Drawn by: 61882
O2 Saturation: 46.2 %
Patient temperature: 36.5
pCO2, Ven: 72 mmHg (ref 44–60)
pH, Ven: 7.26 (ref 7.25–7.43)
pO2, Ven: 35 mmHg (ref 32–45)

## 2021-11-21 LAB — CBC WITH DIFFERENTIAL/PLATELET
Abs Immature Granulocytes: 0.11 10*3/uL — ABNORMAL HIGH (ref 0.00–0.07)
Basophils Absolute: 0.1 10*3/uL (ref 0.0–0.1)
Basophils Relative: 0 %
Eosinophils Absolute: 0.6 10*3/uL — ABNORMAL HIGH (ref 0.0–0.5)
Eosinophils Relative: 3 %
HCT: 48.3 % (ref 39.0–52.0)
Hemoglobin: 15.6 g/dL (ref 13.0–17.0)
Immature Granulocytes: 1 %
Lymphocytes Relative: 12 %
Lymphs Abs: 2.1 10*3/uL (ref 0.7–4.0)
MCH: 29.8 pg (ref 26.0–34.0)
MCHC: 32.3 g/dL (ref 30.0–36.0)
MCV: 92.2 fL (ref 80.0–100.0)
Monocytes Absolute: 0.8 10*3/uL (ref 0.1–1.0)
Monocytes Relative: 4 %
Neutro Abs: 14.6 10*3/uL — ABNORMAL HIGH (ref 1.7–7.7)
Neutrophils Relative %: 80 %
Platelets: 236 10*3/uL (ref 150–400)
RBC: 5.24 MIL/uL (ref 4.22–5.81)
RDW: 13 % (ref 11.5–15.5)
WBC: 18.3 10*3/uL — ABNORMAL HIGH (ref 4.0–10.5)
nRBC: 0 % (ref 0.0–0.2)

## 2021-11-21 MED ORDER — IOHEXOL 300 MG/ML  SOLN
100.0000 mL | Freq: Once | INTRAMUSCULAR | Status: AC | PRN
  Administered 2021-11-21: 100 mL via INTRAVENOUS

## 2021-11-21 MED ORDER — SODIUM CHLORIDE 0.9 % IV SOLN: INTRAVENOUS | Status: DC

## 2021-11-21 MED ORDER — SODIUM CHLORIDE 0.9 % IV BOLUS
500.0000 mL | Freq: Once | INTRAVENOUS | Status: AC
Start: 2021-11-21 — End: 2021-11-21
  Administered 2021-11-21: 500 mL via INTRAVENOUS

## 2021-11-21 MED ORDER — ONDANSETRON HCL 4 MG/2ML IJ SOLN: 4.0000 mg | Freq: Once | INTRAMUSCULAR | Status: AC

## 2021-11-21 MED ORDER — HYDROMORPHONE HCL 1 MG/ML IJ SOLN
1.0000 mg | Freq: Once | INTRAMUSCULAR | Status: AC
  Administered 2021-11-21: 1 mg via INTRAVENOUS
  Filled 2021-11-21: qty 1

## 2021-11-21 MED ORDER — ONDANSETRON HCL 4 MG/2ML IJ SOLN
4.0000 mg | Freq: Once | INTRAMUSCULAR | Status: AC
  Administered 2021-11-21: 4 mg via INTRAVENOUS
  Filled 2021-11-21: qty 2

## 2021-11-21 MED ORDER — ONDANSETRON HCL 4 MG/2ML IJ SOLN
INTRAMUSCULAR | Status: AC
  Administered 2021-11-21: 4 mg via INTRAVENOUS
  Filled 2021-11-21: qty 2

## 2021-11-21 NOTE — ED Triage Notes (Signed)
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 Per EMS patient was diaphoretic and 88% on room air. Patient is pale, diaphoretic, and tachpneic in triage.

## 2021-11-21 NOTE — ED Notes (Signed)
Pt family member asking if results are back yet, says "they are going to get EMS and go to [facility] if something doesn't happen soon, I can see that this ED is not busy" this charge nurse informed pt family that I would check and see if results were back, but the EDP is the person who actually reviews results with pt and family and I will pass along her message to him. Informed pt that results are back after checking and stated again that the  EDP will go over results and I relayed her message.

## 2021-11-21 NOTE — ED Notes (Signed)
EDP made aware of pt blood pressure.

## 2021-11-21 NOTE — ED Notes (Signed)
Patient has thrown up x4 times with respiratory at bedside.

## 2021-11-21 NOTE — ED Notes (Signed)
Patient transported to CT 

## 2021-11-21 NOTE — ED Notes (Signed)
Date and time results received: 11/21/21 0804   Test: PCO2 Critical Value: 72  Name of Provider Notified: Effie Shy  Orders Received? Or Actions Taken?: NA

## 2021-11-21 NOTE — ED Provider Notes (Addendum)
[facility] EMERGENCY DEPARTMENT Provider Note   CSN: [id number] Arrival date & time: 11/21/21  1810     History  Chief Complaint  Patient presents with   Respiratory Distress    Charles Weeks is a 56 y.o. male.  HPI I saw the patient at 6:26 PM.  Nursing was evaluating patient respiratory distress when he started vomiting.  Respiratory therapy was in the room at that time and he was requesting that his tracheostomy be suction.  This was done, but nothing was obtained.  Patient's abdomen is distended and there was a large amount of emesis on the floor.    Home Medications Prior to Admission medications   Medication Sig Start Date End Date Taking? Authorizing Provider  acetaminophen (TYLENOL) 325 MG tablet Take 2 tablets (650 mg total) by mouth every 6 (six) hours as needed for mild pain or moderate pain. 10/30/20  Yes Angiulli, Lavon Paganini, PA-C  albuterol (PROAIR HFA) 108 (90 Base) MCG/ACT inhaler Inhale 1-2 puffs into the lungs every 4 (four) hours as needed for wheezing or shortness of breath. Patient taking differently: Inhale 2 puffs into the lungs 2 (two) times daily as needed for wheezing or shortness of breath. 11/21/20  Yes Lindell Spar, MD  albuterol (PROVENTIL) (2.5 MG/3ML) 0.083% nebulizer solution INHALE (1) VIAL VIA NEBULIZATION EVERY SIX HOURS AS NEEDED FOR WHEEZING OR SHORTNESS OF BREATH. 10/10/21  Yes Tanda Rockers, MD  aspirin 81 MG chewable tablet Chew 1 tablet (81 mg total) by mouth daily. 11/01/20  Yes Angiulli, Lavon Paganini, PA-C  bethanechol (URECHOLINE) 25 MG tablet TAKE TWO TABLETS BY MOUTH FOUR TIMES DAILY Patient taking differently: Take 50 mg by mouth 4 (four) times daily. 11/12/21  Yes Lindell Spar, MD  budesonide-formoterol [facility]) 160-4.5 MCG/ACT inhaler Take 2 puffs first thing in am and then another 2 puffs about 12 hours later. Patient taking differently: Inhale 2 puffs into the lungs in the morning and at bedtime. Take 2 puffs first thing in am and  then another 2 puffs about 12 hours later. 12/24/20  Yes Tanda Rockers, MD  buPROPion (WELLBUTRIN XL) 300 MG 24 hr tablet TAKE 1 TABLET BY MOUTH ONCE DAILY. 11/13/21  Yes Lindell Spar, MD  busPIRone (BUSPAR) 10 MG tablet TAKE ONE TABLET BY MOUTH 3 TIMES A DAY 05/27/21  Yes Lindell Spar, MD  Calcium Carb-Cholecalciferol (CALCIUM 600/VITAMIN D) 600-400 MG-UNIT CHEW Chew 1 each by mouth daily.   Yes [provider]  Cholecalciferol (VITAMIN D3) 125 MCG (5000 UT) CAPS Take 5,000 Units by mouth daily.   Yes [provider]  clonazePAM (KLONOPIN) 0.5 MG tablet TAKE 1 TABLET BY MOUTH AT BEDTIME. 09/16/21  Yes Bayard Hugger, NP  docusate sodium (COLACE) 100 MG capsule Take 200 mg by mouth daily as needed for moderate constipation. 12/20/20  Yes [provider]  folic acid (FOLVITE) 1 MG tablet TAKE ONE TABLET BY MOUTH ONCE DAILY. 05/27/21  Yes Patel, Colin Broach, MD  montelukast (SINGULAIR) 10 MG tablet TAKE ONE TABLET BY MOUTH AT BEDTIME 05/27/21  Yes Patel, Colin Broach, MD  nitroGLYCERIN (NITROSTAT) 0.4 MG SL tablet Place 0.4 mg under the tongue every 5 (five) minutes as needed for chest pain.   Yes [provider]  oxyCODONE ER (XTAMPZA ER) 13.5 MG C12A Take 1 capsule by mouth every 12 (twelve) hours. 10/23/21  Yes Meredith Staggers, MD  OXYGEN Inhale 3 L into the lungs continuous.   Yes [provider]  pantoprazole (PROTONIX) 40 MG tablet TAKE ONE TABLET BY MOUTH ONCE DAILY. 09/16/21  Yes Lindell Spar, MD  QUEtiapine (SEROQUEL) 300 MG tablet TAKE (1/2) A TABLET BY MOUTH TWICE A DAY, AND (1) TABLET AT BEDTIME. 10/23/21  Yes Meredith Staggers, MD  rosuvastatin (CRESTOR) 5 MG tablet TAKE ONE TABLET BY MOUTH ONCE DAILY 09/17/21  Yes Satira Sark, MD  tamsulosin (FLOMAX) 0.4 MG CAPS capsule TAKE (1) CAPSULE BY MOUTH TWICE DAILY. Patient taking differently: Take 0.4 mg by mouth 2 (two) times daily. 05/27/21  Yes Lindell Spar, MD  terazosin (HYTRIN) 5 MG capsule Take 1  capsule (5 mg total) by mouth daily. 04/10/21  Yes Meredith Staggers, MD  Misc. Devices (TRANSPORT [place]) MISC For transporting to and from Doctors appointments 09/06/21   Lindell Spar, MD  oxyCODONE (OXY IR/ROXICODONE) 5 MG immediate release tablet Take 1 tablet (5 mg total) by mouth 4 (four) times daily as needed for moderate pain. 11/01/21   Meredith Staggers, MD  UNABLE TO FIND Med Name: Bedside Table 09/06/21   Lindell Spar, MD  glycopyrrolate (ROBINUL) 1 MG tablet Take 1 tablet (1 mg total) by mouth 3 (three) times daily. 10/30/20 10/31/20  Angiulli, Lavon Paganini, PA-C      Allergies    Patient has no known allergies.    Review of Systems   Review of Systems  Physical Exam Updated Vital Signs BP (!) 158/105   Pulse (!) 102   Temp 97.7 F (36.5 C) (Oral)   Resp 13   Ht 5' 8.5" (1.74 m)   Wt 89 kg   SpO2 100%   BMI 29.40 kg/m  Physical Exam Vitals and nursing note reviewed.  Constitutional:      General: He is in acute distress.     Appearance: He is well-developed. He is ill-appearing and diaphoretic. He is not toxic-appearing.  HENT:     Head: Normocephalic and atraumatic.     Right Ear: External ear normal.     Left Ear: External ear normal.  Eyes:     Conjunctiva/sclera: Conjunctivae normal.     Pupils: Pupils are equal, round, and reactive to light.  Neck:     Trachea: Phonation normal.  Cardiovascular:     Rate and Rhythm: Normal rate and regular rhythm.     Heart sounds: Normal heart sounds. No murmur heard. Pulmonary:     Effort: Pulmonary effort is normal. No respiratory distress.     Breath sounds: Normal breath sounds. No stridor.  Abdominal:     General: There is distension.     Palpations: Abdomen is soft.     Tenderness: There is abdominal tenderness.  Musculoskeletal:        General: Normal range of motion.     Cervical back: Normal range of motion and neck supple.  Skin:    General: Skin is warm.  Neurological:     Mental Status: He is alert and  oriented to person, place, and time.     Cranial Nerves: No cranial nerve deficit.     Sensory: No sensory deficit.     Motor: No abnormal muscle tone.     Coordination: Coordination normal.  Psychiatric:        Mood and Affect: Mood normal.        Behavior: Behavior normal.        Thought Content: Thought content normal.        Judgment: Judgment normal.  ED Results / Procedures / Treatments   Labs (all labs ordered are listed, but only abnormal results are displayed) Labs Reviewed  COMPREHENSIVE METABOLIC PANEL - Abnormal; Notable for the following components:      Result Value   Glucose, Bld 252 (*)    Creatinine, Ser 1.41 (*)    Alkaline Phosphatase 143 (*)    GFR, Estimated 58 (*)    All other components within normal limits  CBC WITH DIFFERENTIAL/PLATELET - Abnormal; Notable for the following components:   WBC 18.3 (*)    Neutro Abs 14.6 (*)    Eosinophils Absolute 0.6 (*)    Abs Immature Granulocytes 0.11 (*)    All other components within normal limits  BLOOD GAS, VENOUS - Abnormal; Notable for the following components:   pCO2, Ven 72 (*)    Bicarbonate 32.5 (*)    Acid-Base Excess 2.3 (*)    All other components within normal limits  BLOOD GAS, ARTERIAL    EKG EKG Interpretation  Date/Time:  Thursday November 21 2021 18:19:48 EDT Ventricular Rate:  69 PR Interval:  153 QRS Duration: 101 QT Interval:  426 QTC Calculation: 457 R Axis:   69 Text Interpretation: Sinus rhythm Ventricular premature complex Consider right atrial enlargement since last tracing no significant change Confirmed by Daleen Bo [phone]) on 11/21/2021 6:33:08 PM  Radiology CT Abdomen Pelvis W Contrast  Result Date: 11/22/2021 CLINICAL DATA:  Acute abdominal pain. EXAM: CT ABDOMEN AND PELVIS WITH CONTRAST TECHNIQUE: Multidetector CT imaging of the abdomen and pelvis was performed using the standard protocol following bolus administration of intravenous contrast. RADIATION DOSE REDUCTION:  This exam was performed according to the departmental dose-optimization program which includes automated exposure control, adjustment of the mA and/or kV according to patient size and/or use of iterative reconstruction technique. CONTRAST:  166m OMNIPAQUE IOHEXOL 300 MG/ML  SOLN COMPARISON:  08/17/2020 FINDINGS: Lower chest: Partial atelectasis of the right middle lobe. Basilar emphysema. There is fluid within the distal esophagus. Hepatobiliary: Tiny hypodensity at the hepatic dome, too small to characterize. No suspicious liver lesion. Gallbladder physiologically distended, no calcified stone. No biliary dilatation. Pancreas: Mildly atrophic.  No ductal dilatation or inflammation. Spleen: Normal in size without focal abnormality. Calcified granuloma. Few splenules are seen inferiorly. Adrenals/Urinary Tract: Normal adrenal glands. No hydronephrosis. Mild perinephric edema about the inferior left kidney. No renal calculi. Unremarkable urinary bladder. Stomach/Bowel: Wall thickening and pericolonic edema of the colon from the splenic flexure through the distal descending. Findings consistent with colitis. Enteric chain sutures are noted in the sigmoid colon. The appendix is visualized and normal. Mild fluid and air distension of small bowel in the upper abdomen, maximal dimension 3.9 cm. There is gaseous gastric distension. Small duodenal diverticulum. Fluid within the distal esophagus. No definite gastric or small bowel inflammation. Vascular/Lymphatic: Moderate aortic atherosclerosis. No aneurysm. No evidence of mesenteric and bowel is disease. Patent mesenteric and portal vein. No abdominopelvic adenopathy. Reproductive: Prostate is unremarkable. Other: No free air. No ascites. No abdominopelvic collection. No abdominal wall hernia. Musculoskeletal: There are no acute or suspicious osseous abnormalities. IMPRESSION: 1. Colitis from the splenic flexure through the distal descending colon, likely infectious or  inflammatory. 2. Gastric and scattered small bowel distension with air and fluid. Findings suggest generalized ileus. There is fluid in the distal esophagus that may be related to reflux or delayed transit. 3. Mild perinephric edema about the inferior left kidney, nonspecific. Recommend correlation with urinalysis to exclude urinary tract infection. 4. Partial atelectasis in  the right middle lobe. Aortic Atherosclerosis (ICD10-I70.0) and Emphysema (ICD10-J43.9). Electronically Signed   By: Keith Rake M.D.   On: 11/22/2021 00:06   DG Chest Port 1 View  Result Date: 11/21/2021 CLINICAL DATA:  Dyspnea EXAM: PORTABLE CHEST 1 VIEW COMPARISON:  10/29/2020 FINDINGS: Cardiac shadow is stable. Tracheostomy tube is noted in satisfactory position. Lungs are well aerated bilaterally. Very mild interstitial markings are noted without focal confluent infiltrate. No edema is seen. IMPRESSION: Mild interstitial markings greater than that seen on the prior exam. No focal infiltrate is seen. Electronically Signed   By: Inez Catalina M.D.   On: 11/21/2021 19:31    Procedures Procedures    Medications Ordered in ED Medications  0.9 %  sodium chloride infusion ( Intravenous New Bag/Given 11/22/21 0040)  ciprofloxacin (CIPRO) IVPB 400 mg (400 mg Intravenous New Bag/Given 11/22/21 0041)  metroNIDAZOLE (FLAGYL) IVPB 500 mg (has no administration in time range)  ondansetron (ZOFRAN) injection 4 mg (4 mg Intravenous Given 11/21/21 1839)  sodium chloride 0.9 % bolus 500 mL (0 mLs Intravenous Stopped 11/21/21 2243)  ondansetron (ZOFRAN) injection 4 mg (4 mg Intravenous Given 11/21/21 2139)  HYDROmorphone (DILAUDID) injection 1 mg (1 mg Intravenous Given 11/21/21 2139)  iohexol (OMNIPAQUE) 300 MG/ML solution 100 mL (100 mLs Intravenous Contrast Given 11/21/21 2355)    ED Course/ Medical Decision Making/ A&P                           Medical Decision Making Amount and/or Complexity of Data Reviewed Independent Historian:  caregiver    Details: Patient's sister states that he was having trouble breathing earlier today and she called EMS to bring him here.  He did not start vomiting until he arrived in the emergency department.  Sister reports he has been diaphoretic for several days.  She states she has his legal guardian because he has a mental deficiency. Labs: ordered.    Details: Venous blood gas, c-Met, CBC-normal except PCO2 elevated, glucose high, GFR low, alk phos stays high, white count high Radiology: ordered and independent interpretation performed.    Details: Chest x-ray, CT abdomen pelvis-no infiltrate or edema on the chest x-ray.  CT imaging indicates colitis, nonspecific etiology. ECG/medicine tests: ordered and independent interpretation performed.    Details: Cardiac monitor-normal sinus rhythm Discussion of management or test interpretation with external provider(s): Consultation hospitalist arrange for admission.  Risk Prescription drug management. Decision regarding hospitalization. Risk Details: Patient presenting with difficulty breathing and concern for mucus in his airway.  He has a tracheostomy.  No clinical evidence for aspiration.  Chest x-ray does not indicate pneumonia.  Patient is sweaty in the ED.  Abdomen somewhat distended and he has had prior abdominal surgery.  CT ordered to evaluate for acute intra-abdominal pathology.  CT indicates colitis.  IV antibiotics started to treat colitis.  Patient hemodynamically stable in the ED with oxygenation 100% on 3 L, by tracheostomy.  He did not require ventilation in the ED.  His venous blood gas has PCO2 elevated at 70.  Arterial blood gas ordered to evaluate for elevated PCO2.  His mental status is not altered from baseline.  If he has persistent PCO2 elevation, he could be treated with ventilator respiration, to improve that.  Doubt sepsis.  He has mild AKI that was treated with IV fluids.  He requires hospitalization for management.  Critical  Care Total time providing critical care: 55 minutes  Final Clinical Impression(s) / ED Diagnoses Final diagnoses:  Colitis  Vomiting, unspecified vomiting type, unspecified whether nausea present  Ileus ([place])  Acute respiratory failure with hypercapnia ([place])  AKI (acute kidney injury) [facility])    Rx / [place] Orders ED Discharge Orders     None         Daleen Bo, MD 11/22/21 1594    Daleen Bo, MD 11/22/21 [phone]

## 2021-11-21 NOTE — ED Notes (Addendum)
Sheralyn Boatman, RN informed this charge nurse that pt family was requesting another nurse  be assigned to her brother after assigned nurse told pt and family that curtain and door to room had to remain closed due to privacy and HIPPA laws. This nurse to talk with pt family member who insists "that it is not a HIPPA violation" and "I needed to educate myself on HIPPA laws" Unable to have productive and calm conversation with pt family member as she continues to talk down to me and pointing her finger in my face. Anette Riedel, RN, [facility] made aware and is now at  bedside talking with pt family member (pt sister)

## 2021-11-21 NOTE — ED Notes (Signed)
Pt vomited, DR Effie Shy made aware- Family (sister) had left, but is now back at bedside. Prior to pt sister leaving, she stopped this nurse and said, "I'm leaving, I may be back tomorrow or I may not, but if he is discharged, I don't know what ya'll are going to do. Ya'll aint doing shit for him." This nurse then asked family to please stop cursing at me. Pt sister very rude and abrasive.  After several more rude comments, pt sister walked down hallway towards lobby.  Dr Effie Shy informed this nurse that pt would be discharged and to call pt sister- Jeanella Anton, RN called and told pt sister what DR Effie Shy had instructed nurse to inform regarding pt discharge, pt sister was then transferred to DR Effie Shy phone and spoke with pt sister via phone until stepping out of EDP office as pt sister had came back in ED and was in hallway in front of EDP office - conversation continued face to face with Dr Effie Shy and pt sister, who is pt legal guardian as noted in chart.

## 2021-11-21 NOTE — ED Notes (Signed)
Pt being suctioned by RT at this time per pt request

## 2021-11-22 ENCOUNTER — Inpatient Hospital Stay (HOSPITAL_COMMUNITY): Payer: Medicaid Other

## 2021-11-22 DIAGNOSIS — E785 Hyperlipidemia, unspecified: Secondary | ICD-10-CM | POA: Diagnosis present

## 2021-11-22 DIAGNOSIS — K529 Noninfective gastroenteritis and colitis, unspecified: Secondary | ICD-10-CM

## 2021-11-22 DIAGNOSIS — N179 Acute kidney failure, unspecified: Secondary | ICD-10-CM

## 2021-11-22 DIAGNOSIS — K922 Gastrointestinal hemorrhage, unspecified: Secondary | ICD-10-CM

## 2021-11-22 DIAGNOSIS — N401 Enlarged prostate with lower urinary tract symptoms: Secondary | ICD-10-CM | POA: Diagnosis present

## 2021-11-22 DIAGNOSIS — D649 Anemia, unspecified: Secondary | ICD-10-CM | POA: Diagnosis present

## 2021-11-22 DIAGNOSIS — I2510 Atherosclerotic heart disease of native coronary artery without angina pectoris: Secondary | ICD-10-CM | POA: Diagnosis present

## 2021-11-22 DIAGNOSIS — E860 Dehydration: Secondary | ICD-10-CM | POA: Diagnosis present

## 2021-11-22 DIAGNOSIS — J9621 Acute and chronic respiratory failure with hypoxia: Secondary | ICD-10-CM | POA: Diagnosis present

## 2021-11-22 DIAGNOSIS — F419 Anxiety disorder, unspecified: Secondary | ICD-10-CM | POA: Diagnosis present

## 2021-11-22 DIAGNOSIS — Z9981 Dependence on supplemental oxygen: Secondary | ICD-10-CM | POA: Diagnosis not present

## 2021-11-22 DIAGNOSIS — R7303 Prediabetes: Secondary | ICD-10-CM | POA: Diagnosis present

## 2021-11-22 DIAGNOSIS — G894 Chronic pain syndrome: Secondary | ICD-10-CM | POA: Diagnosis present

## 2021-11-22 DIAGNOSIS — F32A Depression, unspecified: Secondary | ICD-10-CM | POA: Diagnosis present

## 2021-11-22 DIAGNOSIS — K567 Ileus, unspecified: Secondary | ICD-10-CM | POA: Diagnosis present

## 2021-11-22 DIAGNOSIS — Z930 Tracheostomy status: Secondary | ICD-10-CM | POA: Diagnosis not present

## 2021-11-22 DIAGNOSIS — R1110 Vomiting, unspecified: Secondary | ICD-10-CM | POA: Diagnosis not present

## 2021-11-22 DIAGNOSIS — I10 Essential (primary) hypertension: Secondary | ICD-10-CM | POA: Diagnosis present

## 2021-11-22 DIAGNOSIS — E876 Hypokalemia: Secondary | ICD-10-CM | POA: Diagnosis present

## 2021-11-22 DIAGNOSIS — A419 Sepsis, unspecified organism: Secondary | ICD-10-CM | POA: Diagnosis present

## 2021-11-22 DIAGNOSIS — K219 Gastro-esophageal reflux disease without esophagitis: Secondary | ICD-10-CM | POA: Diagnosis present

## 2021-11-22 DIAGNOSIS — L270 Generalized skin eruption due to drugs and medicaments taken internally: Secondary | ICD-10-CM | POA: Diagnosis not present

## 2021-11-22 DIAGNOSIS — G8929 Other chronic pain: Secondary | ICD-10-CM

## 2021-11-22 DIAGNOSIS — Y92230 Patient room in hospital as the place of occurrence of the external cause: Secondary | ICD-10-CM | POA: Diagnosis not present

## 2021-11-22 DIAGNOSIS — T368X5A Adverse effect of other systemic antibiotics, initial encounter: Secondary | ICD-10-CM | POA: Diagnosis not present

## 2021-11-22 DIAGNOSIS — R6510 Systemic inflammatory response syndrome (SIRS) of non-infectious origin without acute organ dysfunction: Secondary | ICD-10-CM | POA: Diagnosis not present

## 2021-11-22 DIAGNOSIS — J9622 Acute and chronic respiratory failure with hypercapnia: Secondary | ICD-10-CM | POA: Diagnosis present

## 2021-11-22 DIAGNOSIS — R338 Other retention of urine: Secondary | ICD-10-CM | POA: Diagnosis present

## 2021-11-22 DIAGNOSIS — J449 Chronic obstructive pulmonary disease, unspecified: Secondary | ICD-10-CM | POA: Diagnosis present

## 2021-11-22 LAB — COMPREHENSIVE METABOLIC PANEL
ALT: 21 U/L (ref 0–44)
AST: 20 U/L (ref 15–41)
Albumin: 3.8 g/dL (ref 3.5–5.0)
Alkaline Phosphatase: 116 U/L (ref 38–126)
Anion gap: 8 (ref 5–15)
BUN: 15 mg/dL (ref 6–20)
CO2: 27 mmol/L (ref 22–32)
Calcium: 8.7 mg/dL — ABNORMAL LOW (ref 8.9–10.3)
Chloride: 106 mmol/L (ref 98–111)
Creatinine, Ser: 1.07 mg/dL (ref 0.61–1.24)
GFR, Estimated: >60 mL/min (ref >60)
Glucose, Bld: 135 mg/dL — ABNORMAL HIGH (ref 70–99)
Potassium: 3.9 mmol/L (ref 3.5–5.1)
Sodium: 141 mmol/L (ref 135–145)
Total Bilirubin: 0.5 mg/dL (ref 0.3–1.2)
Total Protein: 6.8 g/dL (ref 6.5–8.1)

## 2021-11-22 LAB — CBC WITH DIFFERENTIAL/PLATELET
Abs Immature Granulocytes: 0.06 10*3/uL (ref 0.00–0.07)
Basophils Absolute: 0 10*3/uL (ref 0.0–0.1)
Basophils Relative: 0 %
Eosinophils Absolute: 0.1 10*3/uL (ref 0.0–0.5)
Eosinophils Relative: 1 %
HCT: 42.7 % (ref 39.0–52.0)
Hemoglobin: 14.1 g/dL (ref 13.0–17.0)
Immature Granulocytes: 0 %
Lymphocytes Relative: 5 %
Lymphs Abs: 0.7 10*3/uL (ref 0.7–4.0)
MCH: 30.1 pg (ref 26.0–34.0)
MCHC: 33 g/dL (ref 30.0–36.0)
MCV: 91.2 fL (ref 80.0–100.0)
Monocytes Absolute: 0.9 10*3/uL (ref 0.1–1.0)
Monocytes Relative: 6 %
Neutro Abs: 14.4 10*3/uL — ABNORMAL HIGH (ref 1.7–7.7)
Neutrophils Relative %: 88 %
Platelets: 213 10*3/uL (ref 150–400)
RBC: 4.68 MIL/uL (ref 4.22–5.81)
RDW: 13.1 % (ref 11.5–15.5)
WBC: 16.3 10*3/uL — ABNORMAL HIGH (ref 4.0–10.5)
nRBC: 0 % (ref 0.0–0.2)

## 2021-11-22 LAB — HEMOGLOBIN A1C
Hgb A1c MFr Bld: 6.3 % — ABNORMAL HIGH (ref 4.8–5.6)
Mean Plasma Glucose: 134.11 mg/dL

## 2021-11-22 LAB — BLOOD GAS, VENOUS
Acid-Base Excess: 4.5 mmol/L — ABNORMAL HIGH (ref 0.0–2.0)
Bicarbonate: 31.2 mmol/L — ABNORMAL HIGH (ref 20.0–28.0)
Drawn by: 4442
O2 Saturation: 85.1 %
Patient temperature: 37
pCO2, Ven: 54 mmHg (ref 44–60)
pH, Ven: 7.37 (ref 7.25–7.43)
pO2, Ven: 52 mmHg — ABNORMAL HIGH (ref 32–45)

## 2021-11-22 LAB — BLOOD GAS, ARTERIAL
Acid-Base Excess: 6.2 mmol/L — ABNORMAL HIGH (ref 0.0–2.0)
Bicarbonate: 32.3 mmol/L — ABNORMAL HIGH (ref 20.0–28.0)
Drawn by: 21310
FIO2: 32 %
O2 Saturation: 97 %
Patient temperature: 36.8
pCO2 arterial: 51 mmHg — ABNORMAL HIGH (ref 32–48)
pH, Arterial: 7.41 (ref 7.35–7.45)
pO2, Arterial: 79 mmHg — ABNORMAL LOW (ref 83–108)

## 2021-11-22 LAB — URINALYSIS, COMPLETE (UACMP) WITH MICROSCOPIC
Bacteria, UA: NONE SEEN
Bilirubin Urine: NEGATIVE
Glucose, UA: NEGATIVE mg/dL
Hgb urine dipstick: NEGATIVE
Ketones, ur: NEGATIVE mg/dL
Leukocytes,Ua: NEGATIVE
Nitrite: NEGATIVE
Protein, ur: NEGATIVE mg/dL
Specific Gravity, Urine: 1.032 — ABNORMAL HIGH (ref 1.005–1.030)
pH: 5 (ref 5.0–8.0)

## 2021-11-22 LAB — MRSA NEXT GEN BY PCR, NASAL: MRSA by PCR Next Gen: NOT DETECTED

## 2021-11-22 LAB — LACTIC ACID, PLASMA: Lactic Acid, Venous: 1.9 mmol/L (ref 0.5–1.9)

## 2021-11-22 LAB — MAGNESIUM: Magnesium: 2.1 mg/dL (ref 1.7–2.4)

## 2021-11-22 LAB — GLUCOSE, CAPILLARY
Glucose-Capillary: 164 mg/dL — ABNORMAL HIGH (ref 70–99)
Glucose-Capillary: 130 mg/dL — ABNORMAL HIGH (ref 70–99)
Glucose-Capillary: 122 mg/dL — ABNORMAL HIGH (ref 70–99)
Glucose-Capillary: 114 mg/dL — ABNORMAL HIGH (ref 70–99)

## 2021-11-22 LAB — HIV ANTIBODY (ROUTINE TESTING W REFLEX): HIV Screen 4th Generation wRfx: NONREACTIVE

## 2021-11-22 LAB — POC OCCULT BLOOD, ED: Fecal Occult Bld: POSITIVE — AB

## 2021-11-22 MED ORDER — TERAZOSIN HCL 5 MG PO CAPS
5.0000 mg | ORAL_CAPSULE | Freq: Every day | ORAL | Status: DC
  Administered 2021-11-22: 5 mg via ORAL
  Filled 2021-11-22 (×2): qty 1

## 2021-11-22 MED ORDER — ORAL CARE MOUTH RINSE
15.0000 mL | OROMUCOSAL | Status: DC
  Administered 2021-11-22 – 2021-11-28 (×24): 15 mL via OROMUCOSAL

## 2021-11-22 MED ORDER — PANTOPRAZOLE SODIUM 40 MG PO TBEC: 40.0000 mg | DELAYED_RELEASE_TABLET | Freq: Every day | ORAL | Status: DC

## 2021-11-22 MED ORDER — ROSUVASTATIN CALCIUM 5 MG PO TABS: 5.0000 mg | ORAL_TABLET | Freq: Every day | ORAL | Status: DC

## 2021-11-22 MED ORDER — ONDANSETRON HCL 4 MG/2ML IJ SOLN
4.0000 mg | Freq: Four times a day (QID) | INTRAMUSCULAR | Status: DC | PRN
  Administered 2021-11-23: 4 mg via INTRAVENOUS
  Filled 2021-11-22: qty 2

## 2021-11-22 MED ORDER — TAMSULOSIN HCL 0.4 MG PO CAPS
0.4000 mg | ORAL_CAPSULE | Freq: Two times a day (BID) | ORAL | Status: DC
  Administered 2021-11-22 (×2): 0.4 mg via ORAL
  Filled 2021-11-22 (×2): qty 1

## 2021-11-22 MED ORDER — INSULIN ASPART 100 UNIT/ML IJ SOLN
0.0000 [IU] | Freq: Three times a day (TID) | INTRAMUSCULAR | Status: DC
  Administered 2021-11-22: 2 [IU] via SUBCUTANEOUS
  Administered 2021-11-22: 3 [IU] via SUBCUTANEOUS
  Administered 2021-11-22 – 2021-11-23 (×3): 2 [IU] via SUBCUTANEOUS
  Administered 2021-11-26: 5 [IU] via SUBCUTANEOUS
  Administered 2021-11-27 – 2021-11-28 (×4): 2 [IU] via SUBCUTANEOUS

## 2021-11-22 MED ORDER — OXYCODONE HCL 5 MG PO TABS
5.0000 mg | ORAL_TABLET | Freq: Four times a day (QID) | ORAL | Status: DC | PRN
  Administered 2021-11-22 – 2021-11-27 (×3): 5 mg
  Filled 2021-11-22 (×4): qty 1

## 2021-11-22 MED ORDER — ONDANSETRON HCL 4 MG PO TABS: 4.0000 mg | ORAL_TABLET | Freq: Four times a day (QID) | ORAL | Status: DC | PRN

## 2021-11-22 MED ORDER — BUPROPION HCL ER (XL) 150 MG PO TB24
300.0000 mg | ORAL_TABLET | Freq: Every day | ORAL | Status: DC
  Administered 2021-11-22: 300 mg via ORAL
  Filled 2021-11-22: qty 2

## 2021-11-22 MED ORDER — FOLIC ACID 1 MG PO TABS: 1.0000 mg | ORAL_TABLET | Freq: Every day | ORAL | Status: DC

## 2021-11-22 MED ORDER — CHLORHEXIDINE GLUCONATE CLOTH 2 % EX PADS
6.0000 | MEDICATED_PAD | Freq: Every day | CUTANEOUS | Status: DC
  Administered 2021-11-22 – 2021-11-27 (×6): 6 via TOPICAL

## 2021-11-22 MED ORDER — BUSPIRONE HCL 5 MG PO TABS
10.0000 mg | ORAL_TABLET | Freq: Three times a day (TID) | ORAL | Status: DC
  Administered 2021-11-22 – 2021-11-28 (×17): 10 mg
  Filled 2021-11-22 (×18): qty 2

## 2021-11-22 MED ORDER — HEPARIN SODIUM (PORCINE) 5000 UNIT/ML IJ SOLN
5000.0000 [IU] | Freq: Three times a day (TID) | INTRAMUSCULAR | Status: DC
  Administered 2021-11-22: 5000 [IU] via SUBCUTANEOUS
  Filled 2021-11-22 (×2): qty 1

## 2021-11-22 MED ORDER — QUETIAPINE FUMARATE 100 MG PO TABS
300.0000 mg | ORAL_TABLET | Freq: Every day | ORAL | Status: DC
  Administered 2021-11-22: 300 mg via ORAL
  Filled 2021-11-22: qty 3

## 2021-11-22 MED ORDER — MORPHINE SULFATE (PF) 2 MG/ML IV SOLN
2.0000 mg | INTRAVENOUS | Status: DC | PRN
  Administered 2021-11-22 – 2021-11-28 (×40): 2 mg via INTRAVENOUS
  Filled 2021-11-22 (×42): qty 1

## 2021-11-22 MED ORDER — METRONIDAZOLE 500 MG/100ML IV SOLN
500.0000 mg | Freq: Two times a day (BID) | INTRAVENOUS | Status: DC
Start: 2021-11-22 — End: 2021-11-28
  Administered 2021-11-22 – 2021-11-28 (×13): 500 mg via INTRAVENOUS
  Filled 2021-11-22 (×12): qty 100

## 2021-11-22 MED ORDER — ORAL CARE MOUTH RINSE
15.0000 mL | OROMUCOSAL | Status: DC | PRN
Start: 2021-11-22 — End: 2021-11-28

## 2021-11-22 MED ORDER — ARFORMOTEROL TARTRATE 15 MCG/2ML IN NEBU
15.0000 ug | INHALATION_SOLUTION | Freq: Two times a day (BID) | RESPIRATORY_TRACT | Status: DC
  Administered 2021-11-22 – 2021-11-28 (×13): 15 ug via RESPIRATORY_TRACT
  Filled 2021-11-22 (×12): qty 2

## 2021-11-22 MED ORDER — CLONAZEPAM 0.5 MG PO TABS
0.5000 mg | ORAL_TABLET | Freq: Every day | ORAL | Status: DC
  Administered 2021-11-22: 0.5 mg via ORAL
  Filled 2021-11-22: qty 1

## 2021-11-22 MED ORDER — CIPROFLOXACIN IN D5W 400 MG/200ML IV SOLN
400.0000 mg | Freq: Two times a day (BID) | INTRAVENOUS | Status: DC
  Administered 2021-11-22 – 2021-11-23 (×4): 400 mg via INTRAVENOUS
  Filled 2021-11-22 (×4): qty 200

## 2021-11-22 MED ORDER — QUETIAPINE FUMARATE 100 MG PO TABS
300.0000 mg | ORAL_TABLET | Freq: Every day | ORAL | Status: DC
Start: 2021-11-22 — End: 2021-11-28
  Administered 2021-11-22 – 2021-11-27 (×6): 300 mg
  Filled 2021-11-22 (×7): qty 3

## 2021-11-22 MED ORDER — HEPARIN SODIUM (PORCINE) 5000 UNIT/ML IJ SOLN: 5000.0000 [IU] | Freq: Three times a day (TID) | INTRAMUSCULAR | Status: DC

## 2021-11-22 MED ORDER — FOLIC ACID 1 MG PO TABS
1.0000 mg | ORAL_TABLET | Freq: Every day | ORAL | Status: DC
  Administered 2021-11-22: 1 mg via ORAL
  Filled 2021-11-22: qty 1

## 2021-11-22 MED ORDER — MONTELUKAST SODIUM 10 MG PO TABS
10.0000 mg | ORAL_TABLET | Freq: Every day | ORAL | Status: DC
Start: 2021-11-22 — End: 2021-11-28
  Administered 2021-11-22 – 2021-11-27 (×6): 10 mg
  Filled 2021-11-22 (×6): qty 1

## 2021-11-22 MED ORDER — QUETIAPINE FUMARATE 100 MG PO TABS
150.0000 mg | ORAL_TABLET | Freq: Two times a day (BID) | ORAL | Status: DC
  Administered 2021-11-22 (×2): 150 mg via ORAL
  Filled 2021-11-22 (×2): qty 2

## 2021-11-22 MED ORDER — QUETIAPINE FUMARATE 25 MG PO TABS
150.0000 mg | ORAL_TABLET | Freq: Two times a day (BID) | ORAL | Status: DC
  Administered 2021-11-23 – 2021-11-28 (×11): 150 mg
  Filled 2021-11-22 (×11): qty 2

## 2021-11-22 MED ORDER — BUSPIRONE HCL 5 MG PO TABS
10.0000 mg | ORAL_TABLET | Freq: Three times a day (TID) | ORAL | Status: DC
  Administered 2021-11-22 (×2): 10 mg via ORAL
  Filled 2021-11-22 (×2): qty 2

## 2021-11-22 MED ORDER — PANTOPRAZOLE SODIUM 40 MG IV SOLR
40.0000 mg | INTRAVENOUS | Status: DC
  Administered 2021-11-22 – 2021-11-28 (×7): 40 mg via INTRAVENOUS
  Filled 2021-11-22 (×7): qty 10

## 2021-11-22 MED ORDER — OXYCODONE HCL ER 10 MG PO T12A
10.0000 mg | EXTENDED_RELEASE_TABLET | Freq: Two times a day (BID) | ORAL | Status: DC
  Administered 2021-11-22 (×2): 10 mg via ORAL
  Filled 2021-11-22 (×2): qty 1

## 2021-11-22 MED ORDER — ALBUTEROL SULFATE (2.5 MG/3ML) 0.083% IN NEBU
2.5000 mg | INHALATION_SOLUTION | RESPIRATORY_TRACT | Status: DC | PRN
  Administered 2021-11-22 – 2021-11-24 (×4): 2.5 mg via RESPIRATORY_TRACT
  Filled 2021-11-22 (×4): qty 3

## 2021-11-22 MED ORDER — MOMETASONE FURO-FORMOTEROL FUM 200-5 MCG/ACT IN AERO
2.0000 | INHALATION_SPRAY | Freq: Two times a day (BID) | RESPIRATORY_TRACT | Status: DC
  Filled 2021-11-22: qty 8.8

## 2021-11-22 MED ORDER — MONTELUKAST SODIUM 10 MG PO TABS
10.0000 mg | ORAL_TABLET | Freq: Every day | ORAL | Status: DC
  Administered 2021-11-22: 10 mg via ORAL
  Filled 2021-11-22: qty 1

## 2021-11-22 MED ORDER — ROSUVASTATIN CALCIUM 5 MG PO TABS
5.0000 mg | ORAL_TABLET | Freq: Every day | ORAL | Status: DC
  Administered 2021-11-22: 5 mg via ORAL
  Filled 2021-11-22: qty 1

## 2021-11-22 MED ORDER — ONDANSETRON HCL 4 MG/2ML IJ SOLN
4.0000 mg | Freq: Four times a day (QID) | INTRAMUSCULAR | Status: DC | PRN
  Administered 2021-11-22: 4 mg via INTRAVENOUS
  Filled 2021-11-22: qty 2

## 2021-11-22 MED ORDER — ASPIRIN 81 MG PO CHEW
81.0000 mg | CHEWABLE_TABLET | Freq: Every day | ORAL | Status: DC
  Administered 2021-11-22: 81 mg via ORAL
  Filled 2021-11-22: qty 1

## 2021-11-22 MED ORDER — OXYCODONE HCL 5 MG PO TABS: 5.0000 mg | ORAL_TABLET | Freq: Four times a day (QID) | ORAL | Status: DC | PRN

## 2021-11-22 MED ORDER — ASPIRIN 81 MG PO CHEW: 81.0000 mg | CHEWABLE_TABLET | Freq: Every day | ORAL | Status: DC

## 2021-11-22 MED ORDER — TERAZOSIN HCL 5 MG PO CAPS
5.0000 mg | ORAL_CAPSULE | Freq: Every day | ORAL | Status: DC
  Administered 2021-11-23 – 2021-11-28 (×6): 5 mg
  Filled 2021-11-22 (×6): qty 1

## 2021-11-22 MED ORDER — CLONAZEPAM 0.5 MG PO TABS
0.5000 mg | ORAL_TABLET | Freq: Every day | ORAL | Status: DC
Start: 2021-11-22 — End: 2021-11-28
  Administered 2021-11-22 – 2021-11-27 (×6): 0.5 mg
  Filled 2021-11-22 (×6): qty 1

## 2021-11-22 MED ORDER — INSULIN ASPART 100 UNIT/ML IJ SOLN
0.0000 [IU] | Freq: Every day | INTRAMUSCULAR | Status: DC
  Administered 2021-11-22: 5 [IU] via SUBCUTANEOUS
  Filled 2021-11-22: qty 1

## 2021-11-22 NOTE — Progress Notes (Signed)
The patient's sister has suctioned and dressed(trach),cleaned the inner cannula (ust as she does when patient is home. She has left an new inner cannula  for the patient.

## 2021-11-22 NOTE — Consult Note (Signed)
Gastroenterology Consult   Referring Provider: No ref. provider found Primary Care Physician:  Lindell Spar, MD Primary Gastroenterologist:  previously unassigned (Dr. Jenetta Downer)  Patient ID: Charles Weeks; [id number]; 1965-08-11   Admit date: 11/21/2021  LOS: 0 days   Date of Consultation: 11/22/2021  Reason for Consultation:  heme positive stool, colitis  History of Present Illness   Charles Weeks is a 55 y.o. year old male with history of anxiety, asthma, COPD, chronic respiratory failure with tracheostomy on home O2, BPH, depression, HTN, CAD, MI in 2019 who presented with dyspnea.  He also presented with abdominal distention and in the ED he developed nausea and vomiting.  His caregiver/sister reported he had decreased p.o. intake and subjective fever and chills at home.  GI consulted due to imaging findings of colitis and subsequent dark bloody bowel movement that was heme positive.  ED course: CXR -mild interstitial markings greater than prior exam with no focal infiltrate CT A/P with contrast -distended gallbladder, no biliary ductal dilation, calcified granuloma in the spleen, wall thickening and pericolonic edema of the colon, splenic flexure to the distal descending colon consistent with colitis (infectious versus inflammatory), enteric change sutures noted in the sigmoid colon, mild fluid and air distention of small bowel in the upper abdomen with maximum distention measuring 3.9 cm (generalized ileus), gaseous gastric distention, small duodenal diverticulum, fluid within distal esophagus, no definite gastric or small bowel formation Vitals -tachycardia with heart rate 110, respiratory rate 25, mildly hypotensive with lowest BP 99/71, sats 100% however slight retaining in CO2 give COPD Labs: WBC 18.3, glucose 252, creatinine 1.41, alk phos 143, HgbA1c 6.3 Heme positive stool Blood cultures pending Started on Cipro and Flagyl and given 500 cc bolus of fluid in the ED and  started on fluids at 125cc/hr   Consult: No prior EGD or colonoscopy on file. Patient reports prior colonoscopy in [place], [place]. Unsure of time frame. Had diverticulitis requiring bowel resection in 2006. Had a fall last year in 2022 resulting in broken back and neck with long hospital stay. He had respiratory failure requiring tracheostomy. Also had PEG tube in place and this was removed in June/July 2022.   He reports a couple week history of lack of appetite as well as constipation. Usually takes 2 stool softeners daily but has had infrequent small bowel movements. He is chronically on opioids for chronic neck and back pain. Has been having some nausea and intermittent abdominal pain at home for the last 2 weeks. Denies weight loss. No vomiting episodes until presentation in the ED and none since admission to the ICU. He continues to complain of little nausea. Denies frequent NSAID use other than baby aspirin. Denies alcohol use. Denies diarrhea, melena, or hematochezia. Denied taking pantoprazole at home however this is on his med list.   Nursing reported large dark red stool this morning around 0700, none since then.   Spoke to patients sister (POA): Has been living with his sister for the past 2 years. Had diverticulitis 8 inches bowel removed and colostomy for a few months in 2006. Has been constipated felt to be related to oxycodone. He will take a stool softener 2 in the morning if constipated and to let her know (taking 2/day for the last 4- 6 days). Took 30 minutes for him to have a BM in the ED last night. She feels like this may have been going on for some time. Did complain on intermittent abdominal pain (right or mid lower abdomen)  about a month ago and has a lot of belching frequently. His stomach distention has been like this for about a year or so. He does not eat spicy foods, will have a stromboli or burger here and there. He primarily eats a typical country diet. She reports he has  had some weight loss recently (20-30 lbs in 2 -3 months). Drinks carbonated beverages on a regular basis and no water. History of colon polyps in the family, unsure about colon cancer.     Past Medical History:  Diagnosis Date   Anxiety 01/19/2020   Arthritis    Asthma    Benign prostatic hyperplasia with weak urinary stream 12/08/2019   BPH (benign prostatic hyperplasia) 08/17/2020   Chronic respiratory failure with hypoxia ([place]) 01/24/2020   Has home 02 but not using as of 01/24/2020  -  01/24/2020   Walked RA  approx   200 ft  @ slow pace  stopped due to  Dizzy with sats still 95%      Cigarette smoker 01/24/2020   Stopped regular smoking 10/2019 but still "now and then" as of 01/24/2020    COPD (chronic obstructive pulmonary disease) ([place]) 08/17/2020   gold   COPD GOLD ?     Quit smoking 10/2019  - Labs ordered 01/24/2020  :  allergy profile   alpha one AT phenotype   - 01/24/2020  After extensive coaching inhaler device,  effectiveness =    75% (short Ti) > change symb 80 to 160 2bid      Coronary artery disease 06/08/2018   DES to RCA, [facility] [place], [place])   Depression, major, single episode, moderate ([place]) 01/19/2020   Dyspnea    Encounter for screening for malignant neoplasm of colon 12/08/2019   Essential hypertension 12/08/2019   History of MI (myocardial infarction) 01/19/2020   HTN (hypertension) 08/17/2020   Myocardial infarct ([place]) 2019   Pneumonia     Past Surgical History:  Procedure Laterality Date   BALLOON DILATION N/A 02/06/2021   Procedure: BALLOON DILATION;  Surgeon: Melida Quitter, MD;  Location: [facility];  Service: ENT;  Laterality: N/A;   BOWEL RESECTION  2006   MICROLARYNGOSCOPY WITH LASER AND BALLOON DILATION N/A 02/06/2021   Procedure: SUSPENDED MICRO DIRECT LARYNGOSCOPY;  Surgeon: Melida Quitter, MD;  Location: [facility];  Service: ENT;  Laterality: N/A;   PEG PLACEMENT N/A 09/03/2020   Procedure: PERCUTANEOUS ENDOSCOPIC GASTROSTOMY (PEG) PLACEMENT;   Surgeon: Georganna Skeans, MD;  Location: [facility];  Service: General;  Laterality: N/A;   PERCUTANEOUS TRACHEOSTOMY N/A 09/03/2020   Procedure: PERCUTANEOUS TRACHEOSTOMY USING 6 SHILEY;  Surgeon: Georganna Skeans, MD;  Location: [facility];  Service: General;  Laterality: N/A;   SKIN GRAFT     TONSILLECTOMY AND ADENOIDECTOMY      Prior to Admission medications   Medication Sig Start Date End Date Taking? Authorizing Provider  acetaminophen (TYLENOL) 325 MG tablet Take 2 tablets (650 mg total) by mouth every 6 (six) hours as needed for mild pain or moderate pain. 10/30/20  Yes Angiulli, Lavon Paganini, PA-C  albuterol (PROAIR HFA) 108 (90 Base) MCG/ACT inhaler Inhale 1-2 puffs into the lungs every 4 (four) hours as needed for wheezing or shortness of breath. Patient taking differently: Inhale 2 puffs into the lungs 2 (two) times daily as needed for wheezing or shortness of breath. 11/21/20  Yes Lindell Spar, MD  albuterol (PROVENTIL) (2.5 MG/3ML) 0.083% nebulizer solution INHALE (1) VIAL VIA NEBULIZATION EVERY SIX HOURS AS NEEDED  FOR WHEEZING OR SHORTNESS OF BREATH. 10/10/21  Yes Tanda Rockers, MD  aspirin 81 MG chewable tablet Chew 1 tablet (81 mg total) by mouth daily. 11/01/20  Yes Angiulli, Lavon Paganini, PA-C  bethanechol (URECHOLINE) 25 MG tablet TAKE TWO TABLETS BY MOUTH FOUR TIMES DAILY Patient taking differently: Take 50 mg by mouth 4 (four) times daily. 11/12/21  Yes Lindell Spar, MD  budesonide-formoterol [facility]) 160-4.5 MCG/ACT inhaler Take 2 puffs first thing in am and then another 2 puffs about 12 hours later. Patient taking differently: Inhale 2 puffs into the lungs in the morning and at bedtime. Take 2 puffs first thing in am and then another 2 puffs about 12 hours later. 12/24/20  Yes Tanda Rockers, MD  buPROPion (WELLBUTRIN XL) 300 MG 24 hr tablet TAKE 1 TABLET BY MOUTH ONCE DAILY. 11/13/21  Yes Lindell Spar, MD  busPIRone (BUSPAR) 10 MG tablet TAKE ONE TABLET BY MOUTH 3 TIMES A DAY 05/27/21   Yes Lindell Spar, MD  Calcium Carb-Cholecalciferol (CALCIUM 600/VITAMIN D) 600-400 MG-UNIT CHEW Chew 1 each by mouth daily.   Yes [provider]  Cholecalciferol (VITAMIN D3) 125 MCG (5000 UT) CAPS Take 5,000 Units by mouth daily.   Yes [provider]  clonazePAM (KLONOPIN) 0.5 MG tablet TAKE 1 TABLET BY MOUTH AT BEDTIME. 09/16/21  Yes Bayard Hugger, NP  docusate sodium (COLACE) 100 MG capsule Take 200 mg by mouth daily as needed for moderate constipation. 12/20/20  Yes [provider]  folic acid (FOLVITE) 1 MG tablet TAKE ONE TABLET BY MOUTH ONCE DAILY. 05/27/21  Yes Patel, Colin Broach, MD  montelukast (SINGULAIR) 10 MG tablet TAKE ONE TABLET BY MOUTH AT BEDTIME 05/27/21  Yes Patel, Colin Broach, MD  nitroGLYCERIN (NITROSTAT) 0.4 MG SL tablet Place 0.4 mg under the tongue every 5 (five) minutes as needed for chest pain.   Yes [provider]  oxyCODONE ER (XTAMPZA ER) 13.5 MG C12A Take 1 capsule by mouth every 12 (twelve) hours. 10/23/21  Yes Meredith Staggers, MD  OXYGEN Inhale 3 L into the lungs continuous.   Yes [provider]  pantoprazole (PROTONIX) 40 MG tablet TAKE ONE TABLET BY MOUTH ONCE DAILY. 09/16/21  Yes Lindell Spar, MD  QUEtiapine (SEROQUEL) 300 MG tablet TAKE (1/2) A TABLET BY MOUTH TWICE A DAY, AND (1) TABLET AT BEDTIME. 10/23/21  Yes Meredith Staggers, MD  rosuvastatin (CRESTOR) 5 MG tablet TAKE ONE TABLET BY MOUTH ONCE DAILY 09/17/21  Yes Satira Sark, MD  tamsulosin (FLOMAX) 0.4 MG CAPS capsule TAKE (1) CAPSULE BY MOUTH TWICE DAILY. Patient taking differently: Take 0.4 mg by mouth 2 (two) times daily. 05/27/21  Yes Lindell Spar, MD  terazosin (HYTRIN) 5 MG capsule Take 1 capsule (5 mg total) by mouth daily. 04/10/21  Yes Meredith Staggers, MD  Misc. Devices (TRANSPORT [place]) MISC For transporting to and from Doctors appointments 09/06/21   Lindell Spar, MD  oxyCODONE (OXY IR/ROXICODONE) 5 MG immediate release tablet Take 1 tablet (5 mg  total) by mouth 4 (four) times daily as needed for moderate pain. 11/01/21   Meredith Staggers, MD  UNABLE TO FIND Med Name: Bedside Table 09/06/21   Lindell Spar, MD  glycopyrrolate (ROBINUL) 1 MG tablet Take 1 tablet (1 mg total) by mouth 3 (three) times daily. 10/30/20 10/31/20  Angiulli, Lavon Paganini, PA-C    Current Facility-Administered Medications  Medication Dose Route Frequency Provider Last Rate Last Admin  0.9 %  sodium chloride infusion   Intravenous Continuous Zierle-Ghosh, Asia B, DO 125 mL/hr at 11/22/21 0040 New Bag at 11/22/21 0040   albuterol (PROVENTIL) (2.5 MG/3ML) 0.083% nebulizer solution 2.5 mg  2.5 mg Nebulization Q4H PRN Zierle-Ghosh, Asia B, DO   2.5 mg at 11/22/21 0815   arformoterol (BROVANA) nebulizer solution 15 mcg  15 mcg Nebulization BID Zierle-Ghosh, Asia B, DO   15 mcg at 11/22/21 1638   aspirin chewable tablet 81 mg  81 mg Oral Daily Zierle-Ghosh, Asia B, DO       buPROPion (WELLBUTRIN XL) 24 hr tablet 300 mg  300 mg Oral Daily Zierle-Ghosh, Asia B, DO       busPIRone (BUSPAR) tablet 10 mg  10 mg Oral TID Zierle-Ghosh, Asia B, DO       Chlorhexidine Gluconate Cloth 2 % PADS 6 each  6 each Topical Daily Memon, Jolaine Artist, MD       ciprofloxacin (CIPRO) IVPB 400 mg  400 mg Intravenous Q12H Zierle-Ghosh, Asia B, DO   Stopped at 11/22/21 0141   clonazePAM (KLONOPIN) tablet 0.5 mg  0.5 mg Oral QHS Zierle-Ghosh, Asia B, DO   0.5 mg at 46/65/99 3570   folic acid (FOLVITE) tablet 1 mg  1 mg Oral Daily Zierle-Ghosh, Asia B, DO       heparin injection 5,000 Units  5,000 Units Subcutaneous Q8H Zierle-Ghosh, Asia B, DO   5,000 Units at 11/22/21 0531   insulin aspart (novoLOG) injection 0-15 Units  0-15 Units Subcutaneous TID WC Zierle-Ghosh, Asia B, DO   3 Units at 11/22/21 0821   insulin aspart (novoLOG) injection 0-5 Units  0-5 Units Subcutaneous QHS Zierle-Ghosh, Asia B, DO   5 Units at 11/22/21 0217   metroNIDAZOLE (FLAGYL) IVPB 500 mg  500 mg Intravenous Q12H Zierle-Ghosh,  Asia B, DO 100 mL/hr at 11/22/21 0220 500 mg at 11/22/21 0220   montelukast (SINGULAIR) tablet 10 mg  10 mg Oral QHS Zierle-Ghosh, Asia B, DO   10 mg at 11/22/21 0217   morphine (PF) 2 MG/ML injection 2 mg  2 mg Intravenous Q2H PRN Zierle-Ghosh, Asia B, DO   2 mg at 11/22/21 0813   ondansetron (ZOFRAN) tablet 4 mg  4 mg Oral Q6H PRN Zierle-Ghosh, Asia B, DO       Or   ondansetron (ZOFRAN) injection 4 mg  4 mg Intravenous Q6H PRN Zierle-Ghosh, Asia B, DO       Oral care mouth rinse  15 mL Mouth Rinse 4 times per day Zierle-Ghosh, Asia B, DO   15 mL at 11/22/21 1779   Oral care mouth rinse  15 mL Mouth Rinse PRN Zierle-Ghosh, Asia B, DO       oxyCODONE (Oxy IR/ROXICODONE) immediate release tablet 5 mg  5 mg Oral Q6H PRN Zierle-Ghosh, Asia B, DO       oxyCODONE (OXYCONTIN) 12 hr tablet 10 mg  10 mg Oral Q12H Zierle-Ghosh, Asia B, DO   10 mg at 11/22/21 0217   QUEtiapine (SEROQUEL) tablet 150 mg  150 mg Oral BID Zierle-Ghosh, Asia B, DO       QUEtiapine (SEROQUEL) tablet 300 mg  300 mg Oral QHS Zierle-Ghosh, Asia B, DO   300 mg at 11/22/21 0217   rosuvastatin (CRESTOR) tablet 5 mg  5 mg Oral Daily Zierle-Ghosh, Asia B, DO       tamsulosin (FLOMAX) capsule 0.4 mg  0.4 mg Oral BID Zierle-Ghosh, Asia B, DO   0.4 mg at 11/22/21 0217  terazosin (HYTRIN) capsule 5 mg  5 mg Oral Daily Zierle-Ghosh, Asia B, DO        Allergies as of 11/21/2021   (No Known Allergies)    Family History  Problem Relation Age of Onset   Heart disease Mother    Heart disease Father    Diabetes Sister     Social History   Socioeconomic History   Marital status: Single    Spouse name: Not on file   Number of children: 2   Years of education: Not on file   Highest education level: 7th grade  Occupational History   Not on file  Tobacco Use   Smoking status: Former    Packs/day: 1.50    Types: Cigarettes    Quit date: 08/17/2020    Years since quitting: 1.2   Smokeless tobacco: Never  Vaping Use   Vaping Use:  Never used  Substance and Sexual Activity   Alcohol use: Not Currently   Drug use: Not Currently   Sexual activity: Not on file  Other Topics Concern   Not on file  Social History Narrative   ** Merged History Encounter **       Lives with sister  Both daughters live close by   Sister has dog   Enjoys: shooting pool, fishing, Retail banker  Diet: eats all food groups Caffeine: pop-dr pepper 2 liter  Water: 4-6 cups   Wears seat belt  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 No weapons        Social Determinants of Health   Financial Resource Strain: Low Risk  (12/08/2019)   Overall Financial Resource Strain (CARDIA)    Difficulty of Paying Living Expenses: Not hard at all  Food Insecurity: No Food Insecurity (12/08/2019)   Hunger Vital Sign    Worried About Running Out of Food in the Last Year: Never true    [place] in the Last Year: Never true  Transportation Needs: No Transportation Needs (12/08/2019)   PRAPARE - Hydrologist (Medical): No    Lack of Transportation (Non-Medical): No  Physical Activity: Inactive (12/08/2019)   Exercise Vital Sign    Days of Exercise per Week: 0 days    Minutes of Exercise per Session: 0 min  Stress: No Stress Concern Present (12/08/2019)   [place]    Feeling of Stress : Not at all  Social Connections: Socially Isolated (12/08/2019)   Social Connection and Isolation Panel [NHANES]    Frequency of Communication with Friends and Family: More than three times a week    Frequency of Social Gatherings with Friends and Family: More than three times a week    Attends Religious Services: Never    Marine scientist or Organizations: No    Attends Archivist Meetings: Never    Marital Status: Divorced  Human resources officer Violence: Not At Risk (12/08/2019)   Humiliation, Afraid, Rape, and Kick questionnaire    Fear  of Current or Ex-Partner: No    Emotionally Abused: No    Physically Abused: No    Sexually Abused: No     Review of Systems   Gen: Denies any fever, chills, loss of appetite, change in weight or weight loss CV: Denies chest pain, heart palpitations, syncope, edema  Resp: Denies shortness of breath with rest, cough, wheezing, coughing up blood, and pleurisy. GI: see HPI GU :  Denies urinary burning, blood in urine, urinary frequency, and urinary incontinence. MS: Denies joint pain, limitation of movement, swelling, cramps, and atrophy.  Derm: Denies rash, itching, dry skin, hives. Psych: Denies depression, anxiety, memory loss, hallucinations, and confusion. Heme: Denies bruising or bleeding Neuro:  Denies any headaches, dizziness, paresthesias, shaking  Physical Exam   Vital Signs in last 24 hours: Temp:  [97.7 F (36.5 C)-98.9 F (37.2 C)] 98.1 F (36.7 C) (07/07 0751) Pulse Rate:  [69-110] 95 (07/07 0500) Resp:  [13-25] 16 (07/07 0500) BP: (99-173)/(71-137) 168/93 (07/07 0500) SpO2:  [91 %-100 %] 100 % (07/07 0505) Weight:  [88.1 kg-89 kg] 88.1 kg (07/07 0505) Last BM Date : 11/21/21  General:   Alert,  Well-developed, pleasant and cooperative in NAD Head:  Normocephalic and atraumatic. Eyes:  Sclera clear, no icterus.   Conjunctiva pink. Ears:  Normal auditory acuity. Neck: tracheostomy present.  Lungs:  Clear throughout to auscultation.   No wheezes, crackles, or rhonchi. No acute distress. Heart:  Regular rate and rhythm; no murmurs, clicks, rubs,  or gallops. Abdomen:  Bowel sounds active, has passed some flatus. Mild succession splash. Soft, distended, diffuse tenderness throughout. Midline abdominal scar present. No masses, hepatosplenomegaly or hernias noted. Rectal: deferred Extremities:  Without clubbing or edema. Neurologic:  Alert and  oriented x4. Skin:  Intact without significant lesions or rashes. Psych:  Alert and cooperative. Normal mood and  affect.  Intake/Output from previous day: 07/06 0701 - 07/07 0700 In: 698.4 [IV Piggyback:698.4] Out: -  Intake/Output this shift: No intake/output data recorded.   Labs/Studies   Recent Labs Recent Labs    11/21/21 1908 11/22/21 0357  WBC 18.3* 16.3*  HGB 15.6 14.1  HCT 48.3 42.7  PLT 236 213   BMET Recent Labs    11/21/21 1908 11/22/21 0357  NA 141 141  K 4.2 3.9  CL 101 106  CO2 28 27  GLUCOSE 252* 135*  BUN 15 15  CREATININE 1.41* 1.07  CALCIUM 9.4 8.7*   LFT Recent Labs    11/21/21 1908 11/22/21 0357  PROT 8.0 6.8  ALBUMIN 4.6 3.8  AST 25 20  ALT 27 21  ALKPHOS 143* 116  BILITOT 0.6 0.5   PT/INR No results for input(s): "LABPROT", "INR" in the last 72 hours. Hepatitis Panel No results for input(s): "HEPBSAG", "HCVAB", "HEPAIGM", "HEPBIGM" in the last 72 hours. C-Diff No results for input(s): "CDIFFTOX" in the last 72 hours.  Radiology/Studies CT Abdomen Pelvis W Contrast  Result Date: 11/22/2021 CLINICAL DATA:  Acute abdominal pain. EXAM: CT ABDOMEN AND PELVIS WITH CONTRAST TECHNIQUE: Multidetector CT imaging of the abdomen and pelvis was performed using the standard protocol following bolus administration of intravenous contrast. RADIATION DOSE REDUCTION: This exam was performed according to the departmental dose-optimization program which includes automated exposure control, adjustment of the mA and/or kV according to patient size and/or use of iterative reconstruction technique. CONTRAST:  176m OMNIPAQUE IOHEXOL 300 MG/ML  SOLN COMPARISON:  08/17/2020 FINDINGS: Lower chest: Partial atelectasis of the right middle lobe. Basilar emphysema. There is fluid within the distal esophagus. Hepatobiliary: Tiny hypodensity at the hepatic dome, too small to characterize. No suspicious liver lesion. Gallbladder physiologically distended, no calcified stone. No biliary dilatation. Pancreas: Mildly atrophic.  No ductal dilatation or inflammation. Spleen: Normal in  size without focal abnormality. Calcified granuloma. Few splenules are seen inferiorly. Adrenals/Urinary Tract: Normal adrenal glands. No hydronephrosis. Mild perinephric edema about the inferior left kidney. No renal calculi. Unremarkable urinary bladder. Stomach/Bowel:  Wall thickening and pericolonic edema of the colon from the splenic flexure through the distal descending. Findings consistent with colitis. Enteric chain sutures are noted in the sigmoid colon. The appendix is visualized and normal. Mild fluid and air distension of small bowel in the upper abdomen, maximal dimension 3.9 cm. There is gaseous gastric distension. Small duodenal diverticulum. Fluid within the distal esophagus. No definite gastric or small bowel inflammation. Vascular/Lymphatic: Moderate aortic atherosclerosis. No aneurysm. No evidence of mesenteric and bowel is disease. Patent mesenteric and portal vein. No abdominopelvic adenopathy. Reproductive: Prostate is unremarkable. Other: No free air. No ascites. No abdominopelvic collection. No abdominal wall hernia. Musculoskeletal: There are no acute or suspicious osseous abnormalities. IMPRESSION: 1. Colitis from the splenic flexure through the distal descending colon, likely infectious or inflammatory. 2. Gastric and scattered small bowel distension with air and fluid. Findings suggest generalized ileus. There is fluid in the distal esophagus that may be related to reflux or delayed transit. 3. Mild perinephric edema about the inferior left kidney, nonspecific. Recommend correlation with urinalysis to exclude urinary tract infection. 4. Partial atelectasis in the right middle lobe. Aortic Atherosclerosis (ICD10-I70.0) and Emphysema (ICD10-J43.9). Electronically Signed   By: Keith Rake M.D.   On: 11/22/2021 00:06   DG Chest Port 1 View  Result Date: 11/21/2021 CLINICAL DATA:  Dyspnea EXAM: PORTABLE CHEST 1 VIEW COMPARISON:  10/29/2020 FINDINGS: Cardiac shadow is stable.  Tracheostomy tube is noted in satisfactory position. Lungs are well aerated bilaterally. Very mild interstitial markings are noted without focal confluent infiltrate. No edema is seen. IMPRESSION: Mild interstitial markings greater than that seen on the prior exam. No focal infiltrate is seen. Electronically Signed   By: Inez Catalina M.D.   On: 11/21/2021 19:31     Assessment   Charles Weeks is a 56 y.o. year old male with history of anxiety, asthma, COPD, chronic respiratory failure with tracheostomy on home O2, BPH, depression, HTN, CAD, MI in 2019 who presented initially with dyspnea.  Of note patient's abdomen was distended on presentation and while in the ED developed nausea, vomiting, and had an episode of dark bloody stool.  GI consulted due to heme positive stool and imaging findings representing colitis.  Colitis/constipation: Presented originally with dyspnea but then developed nausea with vomiting in the ED.  Emesis reported as yellow in color. He had CT A/P with wall thickening from the splenic flexure to the distal descending colon with fluid and air distention of the small bowel likely representing generalized ileus.  He subsequently developed a large dark bloody bowel movement overnight per nursing.  Hemoglobin in the ED 15.6, stable at 14.1 this morning likely due to IV hydration however not able to exclude bloody bowel movement is contributing factor.  WBC 18.3 on admission, improved to 16.3 this AM.  He has remained afebrile.  Initially slightly hypotensive in the ED with creatinine elevation to 1.41 however he was given 500 cc bolus and started on IV fluids at 125 cc an hour and has had improvement in his creatinine to 1.07 and has been slightly hypertensive overnight.  Reports subjective fever and chills as well as poor appetite prior to admission. Reports constipation at baseline and will take stool softeners at home for relief. May need NGT for decompression in ongoing vomiting.    Suspect infectious colitis as cause of sepsis with subsequent development ileus due to inflammation.  Patient is prediabetic with elevated hemoglobin A1c has remained stable at 6.2-6.3 over the last couple of years,  could have component of gastroparesis as cause of nausea/vomiting however more likely to be secondary to reflux and development of ileus due to colitis.  Unable to rule out ischemic event causing dark bloody stools x2.  He does have a history of CAD and MI that would put him at risk for ischemic colitis/mesenteric ischemia however this also less likely given stable hemoglobin and isolated events. He has constipation at baseline.  Sister states he will take stool softeners to in the morning if he has been feeling constipated and stated that he was taking them for about 4 to 6 days in a row recently prior to admission.  She did note that he had intermittent right-sided lower abdominal pain that began about a month ago but was quickly resolved within 24 hours.   GERD /nausea/vomiting/abdominal distention: Patient began experiencing nausea with occasional vomiting after presentation to the emergency department.  He was noted to have abdominal distention on arrival as well.  Per discussion with patient's POA, his Sister Jeraldine Loots, she reports that this abdominal distention has been present for about a year or so.  She did state that he has lost about 20 to 30 pounds over the last couple of months.  She states he likes typical Sherrill will have the occasional strong Boley or burger.  He does not drink any water, only has carbonated beverages throughout the day.  She does note frequent belching as well.  He does take pantoprazole 40 mg daily at home.  She is not aware of any melanotic stools prior to admission.  Given patient's history of diverticulitis and prior bowel resection as well as his vomiting, abdominal distention, and unintentional weight loss patient would likely benefit from upper  endoscopy and colonoscopy in the near future after recovery from acute illness.  He will need more aggressive constipation management prior to discharge as well.  Plan / Recommendations   Daily H/H Remain n.p.o. Continue IV fluids Stool studies Continue Cipro and Flagyl Resume home PPI once daily. NGT if ongoing vomiting Rectal tube for decompression per Dr. Jenetta Downer Reduce opioid administration as much as possible.  Will need more aggressive constipation management in the future Will need colonoscopy and possible EGD outpatient in 6-8 weeks after recovery of acute illness.      11/22/2021, 8:36 AM  Venetia Night, MSN, FNP-BC, AGACNP-BC [facility]

## 2021-11-22 NOTE — Assessment & Plan Note (Signed)
-  mild AKI with Cr increase from 1.15 to 1.41 -Likely secondary to poor p.o. intake given colitis and abdominal pain, possibly also due to GI losses but patient does not recall -Continue fluids at 125 mill per hour -Hold nephrotoxic agents when possible -Trend in the a.m.

## 2021-11-22 NOTE — Assessment & Plan Note (Signed)
-   Continue Cipro and Flagyl -CT scan shows colitis.  Gastric and scattered small bowel distention with air-fluid levels that are likely indicative of ileus. -Patient meets SIRS criteria with a white blood cell count of 18.3, heart rate 110, respiratory rate 25 -500 mL fluid bolus given in the ED, patient has been maintaining blood pressure -Unfortunately blood cultures were not drawn prior to giving antibiotics -Continue to monitor

## 2021-11-22 NOTE — Assessment & Plan Note (Signed)
-   Meets sepsis criteria with a white blood cell count of 18.3, heart rate 110, respiratory rate 25, AKI, and source being colitis -Continue Cipro Flagyl -Blood cultures pending -Lactic acid pending -Continue IV hydration -Continue to monitor

## 2021-11-22 NOTE — ED Notes (Signed)
Pt had a bowl movement and large amount of dark bloody stool was present. Vitals stable. Hospitalist made aware. No new order at this time.

## 2021-11-22 NOTE — H&P (Signed)
History and Physical    Patient: Charles Weeks JOI:[id number] DOB: Oct 03, 1965 DOA: 11/21/2021 DOS: the patient was seen and examined on 11/22/2021 PCP: Anabel Halon, MD  Patient coming from: Home  Chief Complaint:  Chief Complaint  Patient presents with   Respiratory Distress   HPI: Charles Weeks is a 56 y.o. male with medical history significant of anxiety, asthma, COPD, chronic respiratory failure with home O2 but on known how much, BPH, depression, hypertension, CAD, and more presents to ED with a chief complaint of dyspnea.  Patient does not provide much history.  He reports he is to tired cancer questions.  He does partially cooperate with the exam and then yawns and goes back to sleep.  It is reported that he felt like his trach was clogged and he could not breathe.  In the ED they suctioned him, not have any abnormal with dissection.  They did a chest x-ray that showed mild interstitial markings greater than the prior exam but no focal infiltrate.  Patient then became nauseous and had episodes of vomiting in the ER.  The caregiver reported that he had decreased p.o. intake, and subjective fever and chills at home.  For this reason CT abdomen pelvis was done that showed colitis with gastric and scattered small bowel distention and air-fluid levels that are indicative of ileus.  Patient was started on Cipro and Flagyl and admitted to hospitalist service.  During patient's cooperative portion of exam he reports that he had dyspnea at home.  He does not know when it started.  He does report that he had an increasing mucus but no bloody mucus production.  He reports that he had some chest pain but does not point to where it was.  He reports he had some lower abdominal pain bilaterally.  Not sure when that started either.  Eventually says he is too sleepy to answer questions, and closes his eyes to go back to sleep.  No further history could be obtained at this time. Review of Systems: Unable to  review all systems due to lack of cooperation from patient. Past Medical History:  Diagnosis Date   Anxiety 01/19/2020   Arthritis    Asthma    Benign prostatic hyperplasia with weak urinary stream 12/08/2019   BPH (benign prostatic hyperplasia) 08/17/2020   Chronic respiratory failure with hypoxia (HCC) 01/24/2020   Has home 02 but not using as of 01/24/2020  -  01/24/2020   Walked RA  approx   200 ft  @ slow pace  stopped due to  Dizzy with sats still 95%      Cigarette smoker 01/24/2020   Stopped regular smoking 10/2019 but still "now and then" as of 01/24/2020    COPD (chronic obstructive pulmonary disease) (HCC) 08/17/2020   gold   COPD GOLD ?     Quit smoking 10/2019  - Labs ordered 01/24/2020  :  allergy profile   alpha one AT phenotype   - 01/24/2020  After extensive coaching inhaler device,  effectiveness =    75% (short Ti) > change symb 80 to 160 2bid      Coronary artery disease 06/08/2018   DES to RCA, [facility] [place], [place])   Depression, major, single episode, moderate (HCC) 01/19/2020   Dyspnea    Encounter for screening for malignant neoplasm of colon 12/08/2019   Essential hypertension 12/08/2019   History of MI (myocardial infarction) 01/19/2020   HTN (hypertension) 08/17/2020   Myocardial infarct (HCC) 2019  Pneumonia    Past Surgical History:  Procedure Laterality Date   BALLOON DILATION N/A 02/06/2021   Procedure: BALLOON DILATION;  Surgeon: Christia Reading, MD;  Location: [facility] OR;  Service: ENT;  Laterality: N/A;   BOWEL RESECTION  2006   MICROLARYNGOSCOPY WITH LASER AND BALLOON DILATION N/A 02/06/2021   Procedure: SUSPENDED MICRO DIRECT LARYNGOSCOPY;  Surgeon: Christia Reading, MD;  Location: [facility] OR;  Service: ENT;  Laterality: N/A;   PEG PLACEMENT N/A 09/03/2020   Procedure: PERCUTANEOUS ENDOSCOPIC GASTROSTOMY (PEG) PLACEMENT;  Surgeon: Violeta Gelinas, MD;  Location: [facility] OR;  Service: General;  Laterality: N/A;   PERCUTANEOUS TRACHEOSTOMY N/A 09/03/2020    Procedure: PERCUTANEOUS TRACHEOSTOMY USING 6 SHILEY;  Surgeon: Violeta Gelinas, MD;  Location: [facility] OR;  Service: General;  Laterality: N/A;   SKIN GRAFT     TONSILLECTOMY AND ADENOIDECTOMY     Social History:  reports that he quit smoking about 15 months ago. His smoking use included cigarettes. He smoked an average of 1.5 packs per day. He has never used smokeless tobacco. He reports that he does not currently use alcohol. He reports that he does not currently use drugs.  No Known Allergies  Family History  Problem Relation Age of Onset   Heart disease Mother    Heart disease Father    Diabetes Sister     Prior to Admission medications   Medication Sig Start Date End Date Taking? Authorizing Provider  acetaminophen (TYLENOL) 325 MG tablet Take 2 tablets (650 mg total) by mouth every 6 (six) hours as needed for mild pain or moderate pain. 10/30/20  Yes Angiulli, Mcarthur Rossetti, PA-C  albuterol (PROAIR HFA) 108 (90 Base) MCG/ACT inhaler Inhale 1-2 puffs into the lungs every 4 (four) hours as needed for wheezing or shortness of breath. Patient taking differently: Inhale 2 puffs into the lungs 2 (two) times daily as needed for wheezing or shortness of breath. 11/21/20  Yes Anabel Halon, MD  albuterol (PROVENTIL) (2.5 MG/3ML) 0.083% nebulizer solution INHALE (1) VIAL VIA NEBULIZATION EVERY SIX HOURS AS NEEDED FOR WHEEZING OR SHORTNESS OF BREATH. 10/10/21  Yes Nyoka Cowden, MD  aspirin 81 MG chewable tablet Chew 1 tablet (81 mg total) by mouth daily. 11/01/20  Yes Angiulli, Mcarthur Rossetti, PA-C  bethanechol (URECHOLINE) 25 MG tablet TAKE TWO TABLETS BY MOUTH FOUR TIMES DAILY Patient taking differently: Take 50 mg by mouth 4 (four) times daily. 11/12/21  Yes Anabel Halon, MD  budesonide-formoterol [facility]) 160-4.5 MCG/ACT inhaler Take 2 puffs first thing in am and then another 2 puffs about 12 hours later. Patient taking differently: Inhale 2 puffs into the lungs in the morning and at bedtime. Take 2  puffs first thing in am and then another 2 puffs about 12 hours later. 12/24/20  Yes Nyoka Cowden, MD  buPROPion (WELLBUTRIN XL) 300 MG 24 hr tablet TAKE 1 TABLET BY MOUTH ONCE DAILY. 11/13/21  Yes Anabel Halon, MD  busPIRone (BUSPAR) 10 MG tablet TAKE ONE TABLET BY MOUTH 3 TIMES A DAY 05/27/21  Yes Anabel Halon, MD  Calcium Carb-Cholecalciferol (CALCIUM 600/VITAMIN D) 600-400 MG-UNIT CHEW Chew 1 each by mouth daily.   Yes [provider]  Cholecalciferol (VITAMIN D3) 125 MCG (5000 UT) CAPS Take 5,000 Units by mouth daily.   Yes [provider]  clonazePAM (KLONOPIN) 0.5 MG tablet TAKE 1 TABLET BY MOUTH AT BEDTIME. 09/16/21  Yes Jones Bales, NP  docusate sodium (COLACE) 100 MG capsule Take 200 mg by  mouth daily as needed for moderate constipation. 12/20/20  Yes [provider]  folic acid (FOLVITE) 1 MG tablet TAKE ONE TABLET BY MOUTH ONCE DAILY. 05/27/21  Yes Patel, Earlie Lou, MD  montelukast (SINGULAIR) 10 MG tablet TAKE ONE TABLET BY MOUTH AT BEDTIME 05/27/21  Yes Patel, Earlie Lou, MD  nitroGLYCERIN (NITROSTAT) 0.4 MG SL tablet Place 0.4 mg under the tongue every 5 (five) minutes as needed for chest pain.   Yes [provider]  oxyCODONE ER (XTAMPZA ER) 13.5 MG C12A Take 1 capsule by mouth every 12 (twelve) hours. 10/23/21  Yes Ranelle Oyster, MD  OXYGEN Inhale 3 L into the lungs continuous.   Yes [provider]  pantoprazole (PROTONIX) 40 MG tablet TAKE ONE TABLET BY MOUTH ONCE DAILY. 09/16/21  Yes Anabel Halon, MD  QUEtiapine (SEROQUEL) 300 MG tablet TAKE (1/2) A TABLET BY MOUTH TWICE A DAY, AND (1) TABLET AT BEDTIME. 10/23/21  Yes Ranelle Oyster, MD  rosuvastatin (CRESTOR) 5 MG tablet TAKE ONE TABLET BY MOUTH ONCE DAILY 09/17/21  Yes Jonelle Sidle, MD  tamsulosin (FLOMAX) 0.4 MG CAPS capsule TAKE (1) CAPSULE BY MOUTH TWICE DAILY. Patient taking differently: Take 0.4 mg by mouth 2 (two) times daily. 05/27/21  Yes Anabel Halon, MD  terazosin  (HYTRIN) 5 MG capsule Take 1 capsule (5 mg total) by mouth daily. 04/10/21  Yes Ranelle Oyster, MD  Misc. Devices (TRANSPORT Miranda) MISC For transporting to and from Doctors appointments 09/06/21   Anabel Halon, MD  oxyCODONE (OXY IR/ROXICODONE) 5 MG immediate release tablet Take 1 tablet (5 mg total) by mouth 4 (four) times daily as needed for moderate pain. 11/01/21   Ranelle Oyster, MD  UNABLE TO FIND Med Name: Bedside Table 09/06/21   Anabel Halon, MD  glycopyrrolate (ROBINUL) 1 MG tablet Take 1 tablet (1 mg total) by mouth 3 (three) times daily. 10/30/20 10/31/20  Charlton Amor, PA-C    Physical Exam: Vitals:   11/22/21 0250 11/22/21 0300 11/22/21 0400 11/22/21 0500  BP: (!) 144/91 (!) 146/86 (!) 173/95 (!) 168/93  Pulse: (!) 107 100 97 95  Resp: 14 15 20 16   Temp:      TempSrc:      SpO2: 91% 96% 100% 100%  Weight:      Height:       1.  General: Patient lying supine in bed,  no acute distress   2. Psychiatric: Drowsy and oriented, partially cooperative with exam  3. Neurologic: Speech and language are normal, face is symmetric, moves all 4 extremities voluntarily   4. HEENMT:  Head is atraumatic, normocephalic, pupils reactive to light, neck is supple, trachea is midline, mucous membranes are moist   5. Respiratory : Lungs are clear to auscultation bilaterally without wheezing, rhonchi, rales, no cyanosis, no increase in work of breathing or accessory muscle use   6. Cardiovascular : Heart rate normal, rhythm is regular, no murmurs, rubs or gallops, no peripheral edema, peripheral pulses palpated   7. Gastrointestinal:  Abdomen is soft, mildly distended, diffuse tenderness to palpation without guarding, bowel sounds active, no masses or organomegaly palpated   8. Skin:  Skin is warm, dry and intact without rashes, acute lesions, or ulcers on limited exam   9.Musculoskeletal:  No acute deformities or trauma, no asymmetry in tone, no peripheral edema,  peripheral pulses palpated, no tenderness to palpation in the extremities  Data Reviewed: In the ED Temp 97.7, heart rate  69-110, respiratory rate 13-25, blood pressure 99/71-171/137, satting at 100% Leukocytosis 18.3, hemoglobin 15.6, platelets 246 Chemistry shows a creatinine 1.41, glucose 252 Initial blood gas shows a pH of 7.26, PCO2 72, PO2 35 CT abdomen pelvis shows colitis with gastric and scattered small bowel distention with air-fluid levels that are indicative of ileus.  Mild perinephric edema about the inferior left kidney. Chest x-ray shows mild interstitial markings greater than that on prior exam.  No focal infiltrate EKG shows heart rate 69, sinus rhythm, QTc 457 Patient was given a 500 mL bolus, continue to 125 mill per hour Cipro and Flagyl started, Zofran given in the ED Admission requested for management of colitis  Assessment and Plan: * SIRS (systemic inflammatory response syndrome) (HCC) - Meets sepsis criteria with a white blood cell count of 18.3, heart rate 110, respiratory rate 25, AKI, and source being colitis -Continue Cipro Flagyl -Blood cultures pending -Lactic acid pending -Continue IV hydration -Continue to monitor  HLD (hyperlipidemia) - Continue statin  GERD (gastroesophageal reflux disease) - Holding Protonix in the setting of AKI  Chronic pain - Continue scheduled and as needed oxy -Continue morphine for severe pain -Continue to monitor  AKI (acute kidney injury) (HCC) -mild AKI with Cr increase from 1.15 to 1.41 -Likely secondary to poor p.o. intake given colitis and abdominal pain, possibly also due to GI losses but patient does not recall -Continue fluids at 125 mill per hour -Hold nephrotoxic agents when possible -Trend in the a.m.  Colitis - Continue Cipro and Flagyl -CT scan shows colitis.  Gastric and scattered small bowel distention with air-fluid levels that are likely indicative of ileus. -Patient meets SIRS criteria with a  white blood cell count of 18.3, heart rate 110, respiratory rate 25 -500 mL fluid bolus given in the ED, patient has been maintaining blood pressure -Unfortunately blood cultures were not drawn prior to giving antibiotics -Continue to monitor  Depression with anxiety - Continue Wellbutrin, BuSpar, Klonopin  COPD (chronic obstructive pulmonary disease) (HCC) - Patient is unaware of baseline oxygen requirement -He currently has trach collar and initial VBG shows hypercapnia with a PCO2 of 72 -ABG following that showed a PCO2 in the 50s -Patient reports he is too tired to answer my questions and stops answering questions in the middle of the interview, repeating VBG -Apparently pulm was consulted from the ED and recommends looking trach up to ventilator if patient has worsening CO2 retention, or decompensation -Continue LABA neb -Continue as needed albuterol neb -Continue Singulair      Advance Care Planning:   Code Status: Full Code   Consults: GI  Family Communication: No family at bedside  Severity of Illness: The appropriate patient status for this patient is INPATIENT. Inpatient status is judged to be reasonable and necessary in order to provide the required intensity of service to ensure the patient's safety. The patient's presenting symptoms, physical exam findings, and initial radiographic and laboratory data in the context of their chronic comorbidities is felt to place them at high risk for further clinical deterioration. Furthermore, it is not anticipated that the patient will be medically stable for discharge from the hospital within 2 midnights of admission.   * I certify that at the point of admission it is my clinical judgment that the patient will require inpatient hospital care spanning beyond 2 midnights from the point of admission due to high intensity of service, high risk for further deterioration and high frequency of surveillance required.*  Author: [place] B  Zierle-Ghosh, DO 11/22/2021 5:37 AM  For on call review [email or web address].

## 2021-11-22 NOTE — Assessment & Plan Note (Signed)
-   Continue scheduled and as needed oxy -Continue morphine for severe pain -Continue to monitor

## 2021-11-22 NOTE — TOC Progression Note (Signed)
  Transition of Care [facility]) Screening Note   Patient Details  Name: Charles Weeks Date of Birth: 06-24-1965   Transition of Care [facility]) CM/SW Contact:    Leitha Bleak, RN Phone Number: 11/22/2021, 11:47 AM    Transition of Care Department [facility]) has reviewed patient and no TOC needs have been identified at this time. We will continue to monitor patient advancement through interdisciplinary progression rounds. If new patient transition needs arise, please place a TOC consult.    Expected Discharge Plan: Home/Self Care Barriers to Discharge: Continued Medical Work up  Expected Discharge Plan and Services Expected Discharge Plan: Home/Self Care

## 2021-11-22 NOTE — Progress Notes (Signed)
Patient admitted to the hospital earlier this morning by Dr. Carren Rang  Patient seen and examined.  Currently on 6 L of oxygen.  Does not appear to be acutely short of breath.  Says he is mildly nauseous.  Does not have any abdominal pain at this time.  Abdomen does seem to be distended.  He has been admitted to the hospital with abdominal distention, vomiting.  CT of the abdomen does show colitis and ileus.  He has been started on antibiotics.  We will check stool studies.  GI has been consulted.  He was noted to have a dark stool that was heme positive overnight.  He has not had further vomiting since he is come up to the ICU, so we will hold off on NG tube placement for now.  He is normally on 3 L of oxygen at home.  He does not have any obvious pneumonia on imaging.  We will continue to wean down oxygen as tolerated.  Encourage incentive spirometry.  Patient's sister was updated.  [employer]

## 2021-11-22 NOTE — Assessment & Plan Note (Signed)
-   Continue Wellbutrin, BuSpar, Klonopin

## 2021-11-22 NOTE — Assessment & Plan Note (Signed)
Continue statin. 

## 2021-11-22 NOTE — Progress Notes (Signed)
Patient's sister Charles Weeks performing tracheal suction without gloves. Taught sterile technique, advised [name] to disinfect hands prior procedure, use sterile gloves and new sterile suction catheters each time. [name] stating she is a Designer, fashion/clothing and performs suction procedures on patient at his home.

## 2021-11-22 NOTE — Assessment & Plan Note (Signed)
-   Holding Protonix in the setting of AKI

## 2021-11-22 NOTE — Assessment & Plan Note (Signed)
-   Likely related to colitis -RN reports bloody bowel movements it was Hemoccult positive -Trend hemoglobin in the a.m. -Consult GI

## 2021-11-22 NOTE — Assessment & Plan Note (Signed)
-   Patient is unaware of baseline oxygen requirement -He currently has trach collar and initial VBG shows hypercapnia with a PCO2 of 72 -ABG following that showed a PCO2 in the 50s -Patient reports he is too tired to answer my questions and stops answering questions in the middle of the interview, repeating VBG -Apparently pulm was consulted from the ED and recommends looking trach up to ventilator if patient has worsening CO2 retention, or decompensation -Continue LABA neb -Continue as needed albuterol neb -Continue Singulair

## 2021-11-23 ENCOUNTER — Inpatient Hospital Stay (HOSPITAL_COMMUNITY): Payer: Medicaid Other

## 2021-11-23 DIAGNOSIS — K922 Gastrointestinal hemorrhage, unspecified: Secondary | ICD-10-CM | POA: Diagnosis not present

## 2021-11-23 DIAGNOSIS — G894 Chronic pain syndrome: Secondary | ICD-10-CM

## 2021-11-23 DIAGNOSIS — R6510 Systemic inflammatory response syndrome (SIRS) of non-infectious origin without acute organ dysfunction: Secondary | ICD-10-CM | POA: Diagnosis not present

## 2021-11-23 DIAGNOSIS — N179 Acute kidney failure, unspecified: Secondary | ICD-10-CM

## 2021-11-23 DIAGNOSIS — K529 Noninfective gastroenteritis and colitis, unspecified: Secondary | ICD-10-CM

## 2021-11-23 LAB — BASIC METABOLIC PANEL
Anion gap: 8 (ref 5–15)
BUN: 7 mg/dL (ref 6–20)
CO2: 28 mmol/L (ref 22–32)
Calcium: 8.1 mg/dL — ABNORMAL LOW (ref 8.9–10.3)
Chloride: 104 mmol/L (ref 98–111)
Creatinine, Ser: 0.76 mg/dL (ref 0.61–1.24)
GFR, Estimated: >60 mL/min (ref >60)
Glucose, Bld: 123 mg/dL — ABNORMAL HIGH (ref 70–99)
Potassium: 3.2 mmol/L — ABNORMAL LOW (ref 3.5–5.1)
Sodium: 140 mmol/L (ref 135–145)

## 2021-11-23 LAB — CBC
HCT: 40.4 % (ref 39.0–52.0)
Hemoglobin: 13.2 g/dL (ref 13.0–17.0)
MCH: 29.8 pg (ref 26.0–34.0)
MCHC: 32.7 g/dL (ref 30.0–36.0)
MCV: 91.2 fL (ref 80.0–100.0)
Platelets: 199 10*3/uL (ref 150–400)
RBC: 4.43 MIL/uL (ref 4.22–5.81)
RDW: 13.3 % (ref 11.5–15.5)
WBC: 13.6 10*3/uL — ABNORMAL HIGH (ref 4.0–10.5)
nRBC: 0 % (ref 0.0–0.2)

## 2021-11-23 LAB — GLUCOSE, CAPILLARY
Glucose-Capillary: 104 mg/dL — ABNORMAL HIGH (ref 70–99)
Glucose-Capillary: 105 mg/dL — ABNORMAL HIGH (ref 70–99)
Glucose-Capillary: 136 mg/dL — ABNORMAL HIGH (ref 70–99)
Glucose-Capillary: 142 mg/dL — ABNORMAL HIGH (ref 70–99)
Glucose-Capillary: 147 mg/dL — ABNORMAL HIGH (ref 70–99)

## 2021-11-23 MED ORDER — POTASSIUM CHLORIDE 10 MEQ/100ML IV SOLN
10.0000 meq | INTRAVENOUS | Status: AC
  Administered 2021-11-23 (×4): 10 meq via INTRAVENOUS
  Filled 2021-11-23 (×3): qty 100

## 2021-11-23 MED ORDER — SODIUM CHLORIDE 0.9 % IV SOLN
2.0000 g | INTRAVENOUS | Status: DC
  Administered 2021-11-23 – 2021-11-27 (×5): 2 g via INTRAVENOUS
  Filled 2021-11-23 (×6): qty 20

## 2021-11-23 MED ORDER — DIPHENHYDRAMINE HCL 50 MG/ML IJ SOLN
25.0000 mg | Freq: Four times a day (QID) | INTRAMUSCULAR | Status: DC | PRN
Start: 2021-11-23 — End: 2021-11-28
  Administered 2021-11-23: 25 mg via INTRAVENOUS
  Filled 2021-11-23: qty 1

## 2021-11-23 NOTE — Progress Notes (Signed)
PROGRESS NOTE    Charles Weeks  NFA:[id number] DOB: [birth year] DOA: 11/21/2021 PCP: Anabel Halon, MD    Brief Narrative:  56 year old male with a history of COPD, chronic respiratory failure on 3 L of oxygen, tracheostomy dependent, depression, anxiety, admitted to the hospital with shortness of breath.  Although he did not have any significant pneumonia on chest imaging, he was noted to have possible colitis and ileus on CT abdomen.  He was admitted for further evaluation and started on antibiotics.  Seen by GI.  Started to have vomiting and had NG tube placed for decompression.   Assessment & Plan:   Principal Problem:   SIRS (systemic inflammatory response syndrome) (HCC) Active Problems:   COPD (chronic obstructive pulmonary disease) (HCC)   Depression with anxiety   Colitis   AKI (acute kidney injury) (HCC)   Chronic pain   GERD (gastroesophageal reflux disease)   HLD (hyperlipidemia)   GI bleed   Sepsis, present on admission, ruled in -On admission: Meets sepsis criteria with a white blood cell count of 18.3, heart rate 110, respiratory rate 25, AKI, and source being colitis -Started on Cipro/Flagyl.  Cipro discontinued since he developed hives and patient started on ceftriaxone -Lactic acid normal -Hydrated with IV fluids -Blood cultures in process  Colitis -Unclear etiology, possibly infectious -On antibiotics -Stool studies been ordered  Ileus -Continues to have abdominal distention and no significant bowel movements -NG tube in place for decompression -GI following -May need colonoscopy versus rivastigmine if he does not improve -Encourage ambulation -Limit opiates as much as possible  Acute kidney injury -Related to dehydration -Improved with IV fluids  COPD -Initially had elevated PCO2 on admission -This has improved back to normal range -Continue bronchodilators, Singulair -No wheezing at this time  Chronic respiratory failure with hypoxia -He  is trach dependent and chronically on 3 L of oxygen  Heme positive stools -Possibly related to colitis -Hemoglobin currently stable -GI following  Chronic pain syndrome -Continue pain medications as needed -Holding oxycodone for now  Depression, anxiety -Continue on BuSpar, Klonopin and Seroquel -Wellbutrin XL currently on hold since it cannot be put down NG tube.  DVT prophylaxis: SCDs Start: 11/22/21 0139  Code Status: Full code Family Communication: Updated sister at the bedside Disposition Plan: Status is: Inpatient Remains inpatient appropriate because: Continued management of ileus, currently has NG tube for decompression, await return of bowel function     Consultants:  GI  Procedures:    Antimicrobials:  Cipro 7/7 > Flagyl 7/7>    Subjective: He has not had a bowel movement today.  He says he is passing some gas.  Abdomen remains distended.  He has NG tube in place.  Objective: Vitals:   11/23/21 1117 11/23/21 1200 11/23/21 1300 11/23/21 1610  BP:  (!) 141/49    Pulse:  91    Resp:  15 14   Temp: 98.8 F (37.1 C)   98.5 F (36.9 C)  TempSrc: Oral   Oral  SpO2:  97% 100%   Weight:      Height:        Intake/Output Summary (Last 24 hours) at 11/23/2021 1704 Last data filed at 11/23/2021 1314 Gross per 24 hour  Intake 3056.05 ml  Output 3000 ml  Net 56.05 ml   Filed Weights   11/21/21 1824 11/22/21 0505 11/23/21 0500  Weight: 89 kg 88.1 kg 86.8 kg    Examination:  General exam: Appears calm and comfortable  Respiratory system: Clear to  auscultation. Respiratory effort normal. Cardiovascular system: S1 & S2 heard, RRR. No JVD, murmurs, rubs, gallops or clicks. No pedal edema. Gastrointestinal system: Abdomen is distended, soft and nontender. No organomegaly or masses felt.  Hypoactive bowel sounds Central nervous system: Alert and oriented. No focal neurological deficits. Extremities: Symmetric 5 x 5 power. Skin: No rashes, lesions or  ulcers Psychiatry: Judgement and insight appear normal. Mood & affect appropriate.     Data Reviewed: I have personally reviewed following labs and imaging studies  CBC: Recent Labs  Lab 11/21/21 1908 11/22/21 0357 11/23/21 0341  WBC 18.3* 16.3* 13.6*  NEUTROABS 14.6* 14.4*  --   HGB 15.6 14.1 13.2  HCT 48.3 42.7 40.4  MCV 92.2 91.2 91.2  PLT 236 213 199   Basic Metabolic Panel: Recent Labs  Lab 11/21/21 1908 11/22/21 0357 11/23/21 0341  NA 141 141 140  K 4.2 3.9 3.2*  CL 101 106 104  CO2 28 27 28   GLUCOSE 252* 135* 123*  BUN 15 15 7   CREATININE 1.41* 1.07 0.76  CALCIUM 9.4 8.7* 8.1*  MG  --  2.1  --    GFR: Estimated Creatinine Clearance: 111.6 mL/min (by C-G formula based on SCr of 0.76 mg/dL). Liver Function Tests: Recent Labs  Lab 11/21/21 1908 11/22/21 0357  AST 25 20  ALT 27 21  ALKPHOS 143* 116  BILITOT 0.6 0.5  PROT 8.0 6.8  ALBUMIN 4.6 3.8   No results for input(s): "LIPASE", "AMYLASE" in the last 168 hours. No results for input(s): "AMMONIA" in the last 168 hours. Coagulation Profile: No results for input(s): "INR", "PROTIME" in the last 168 hours. Cardiac Enzymes: No results for input(s): "CKTOTAL", "CKMB", "CKMBINDEX", "TROPONINI" in the last 168 hours. BNP (last 3 results) No results for input(s): "PROBNP" in the last 8760 hours. HbA1C: Recent Labs    11/22/21 0357  HGBA1C 6.3*   CBG: Recent Labs  Lab 11/22/21 2013 11/23/21 0036 11/23/21 0743 11/23/21 1119 11/23/21 1613  GLUCAP 114* 147* 136* 142* 105*   Lipid Profile: No results for input(s): "CHOL", "HDL", "LDLCALC", "TRIG", "CHOLHDL", "LDLDIRECT" in the last 72 hours. Thyroid Function Tests: No results for input(s): "TSH", "T4TOTAL", "FREET4", "T3FREE", "THYROIDAB" in the last 72 hours. Anemia Panel: No results for input(s): "VITAMINB12", "FOLATE", "FERRITIN", "TIBC", "IRON", "RETICCTPCT" in the last 72 hours. Sepsis Labs: Recent Labs  Lab 11/22/21 0357  LATICACIDVEN  1.9    Recent Results (from the past 240 hour(s))  Culture, blood (Routine X 2) w Reflex to ID Panel     Status: None (Preliminary result)   Collection Time: 11/22/21  6:40 AM   Specimen: BLOOD LEFT HAND  Result Value Ref Range Status   Specimen Description BLOOD LEFT HAND  Final   Special Requests   Final    BOTTLES DRAWN AEROBIC AND ANAEROBIC Blood Culture adequate volume   Culture   Final    NO GROWTH < 24 HOURS Performed at [facility], [address], [place], [address] [place]    Report Status PENDING  Incomplete  Culture, blood (Routine X 2) w Reflex to ID Panel     Status: None (Preliminary result)   Collection Time: 11/22/21  6:40 AM   Specimen: Left Antecubital; Blood  Result Value Ref Range Status   Specimen Description LEFT ANTECUBITAL  Final   Special Requests   Final    BOTTLES DRAWN AEROBIC AND ANAEROBIC Blood Culture adequate volume   Culture   Final    NO GROWTH < 24  HOURS Performed at [facility], [address] [place], [place] [address]    Report Status PENDING  Incomplete  MRSA Next Gen by PCR, Nasal     Status: None   Collection Time: 11/22/21  3:36 PM   Specimen: Nasal Mucosa; Nasal Swab  Result Value Ref Range Status   MRSA by PCR Next Gen NOT DETECTED NOT DETECTED Final    Comment: (NOTE) The GeneXpert MRSA Assay (FDA approved for NASAL specimens only), is one component of a comprehensive MRSA colonization surveillance program. It is not intended to diagnose MRSA infection nor to guide or monitor treatment for MRSA infections. Test performance is not FDA approved in patients less than 53 years old. Performed at [facility] [address], [place], [place] [address]          Radiology Studies: DG Abd 1 View  Result Date: 11/23/2021 CLINICAL DATA:  Check nasogastric catheter placement EXAM: ABDOMEN - 1 VIEW COMPARISON:  None Available. FINDINGS: Cardiac shadow is within normal limits. Gastric catheter is noted extending into the stomach. Tip  of the tracheostomy tube is noted in satisfactory position. Lungs are hypoinflated. IMPRESSION: Gastric catheter within the stomach. Electronically Signed   By: Alcide Clever M.D.   On: 11/23/2021 00:50   DG Abd 1 View  Result Date: 11/22/2021 CLINICAL DATA:  Enteric catheter placement EXAM: ABDOMEN - 1 VIEW COMPARISON:  08/31/2020 FINDINGS: Frontal view of the lower chest and upper abdomen demonstrates enteric catheter passing below diaphragm, tip and side port projecting over gastric body. Bowel gas pattern is unremarkable. Lung bases are clear. IMPRESSION: 1. Enteric catheter projecting over gastric body. Electronically Signed   By: Sharlet Salina M.D.   On: 11/22/2021 17:55   CT Abdomen Pelvis W Contrast  Result Date: 11/22/2021 CLINICAL DATA:  Acute abdominal pain. EXAM: CT ABDOMEN AND PELVIS WITH CONTRAST TECHNIQUE: Multidetector CT imaging of the abdomen and pelvis was performed using the standard protocol following bolus administration of intravenous contrast. RADIATION DOSE REDUCTION: This exam was performed according to the departmental dose-optimization program which includes automated exposure control, adjustment of the mA and/or kV according to patient size and/or use of iterative reconstruction technique. CONTRAST:  OMNIPAQUE IOHEXOL 300 MG/ML  SOLN COMPARISON:  08/17/2020 FINDINGS: Lower chest: Partial atelectasis of the right middle lobe. Basilar emphysema. There is fluid within the distal esophagus. Hepatobiliary: Tiny hypodensity at the hepatic dome, too small to characterize. No suspicious liver lesion. Gallbladder physiologically distended, no calcified stone. No biliary dilatation. Pancreas: Mildly atrophic.  No ductal dilatation or inflammation. Spleen: Normal in size without focal abnormality. Calcified granuloma. Few splenules are seen inferiorly. Adrenals/Urinary Tract: Normal adrenal glands. No hydronephrosis. Mild perinephric edema about the inferior left kidney. No renal  calculi. Unremarkable urinary bladder. Stomach/Bowel: Wall thickening and pericolonic edema of the colon from the splenic flexure through the distal descending. Findings consistent with colitis. Enteric chain sutures are noted in the sigmoid colon. The appendix is visualized and normal. Mild fluid and air distension of small bowel in the upper abdomen, maximal dimension 3.9 cm. There is gaseous gastric distension. Small duodenal diverticulum. Fluid within the distal esophagus. No definite gastric or small bowel inflammation. Vascular/Lymphatic: Moderate aortic atherosclerosis. No aneurysm. No evidence of mesenteric and bowel is disease. Patent mesenteric and portal vein. No abdominopelvic adenopathy. Reproductive: Prostate is unremarkable. Other: No free air. No ascites. No abdominopelvic collection. No abdominal wall hernia. Musculoskeletal: There are no acute or suspicious osseous abnormalities. IMPRESSION: 1. Colitis from the splenic  flexure through the distal descending colon, likely infectious or inflammatory. 2. Gastric and scattered small bowel distension with air and fluid. Findings suggest generalized ileus. There is fluid in the distal esophagus that may be related to reflux or delayed transit. 3. Mild perinephric edema about the inferior left kidney, nonspecific. Recommend correlation with urinalysis to exclude urinary tract infection. 4. Partial atelectasis in the right middle lobe. Aortic Atherosclerosis (ICD10-I70.0) and Emphysema (ICD10-J43.9). Electronically Signed   By: Narda Rutherford M.D.   On: 11/22/2021 00:06   DG Chest Port 1 View  Result Date: 11/21/2021 CLINICAL DATA:  Dyspnea EXAM: PORTABLE CHEST 1 VIEW COMPARISON:  10/29/2020 FINDINGS: Cardiac shadow is stable. Tracheostomy tube is noted in satisfactory position. Lungs are well aerated bilaterally. Very mild interstitial markings are noted without focal confluent infiltrate. No edema is seen. IMPRESSION: Mild interstitial markings  greater than that seen on the prior exam. No focal infiltrate is seen. Electronically Signed   By: Alcide Clever M.D.   On: 11/21/2021 19:31        Scheduled Meds:  arformoterol  15 mcg Nebulization BID   busPIRone  10 mg Per Tube TID   Chlorhexidine Gluconate Cloth  6 each Topical Daily   clonazePAM  0.5 mg Per Tube QHS   insulin aspart  0-15 Units Subcutaneous TID WC   insulin aspart  0-5 Units Subcutaneous QHS   montelukast  10 mg Per Tube QHS   mouth rinse  15 mL Mouth Rinse 4 times per day   pantoprazole (PROTONIX) IV  40 mg Intravenous Q24H   QUEtiapine  150 mg Per Tube BID   QUEtiapine  300 mg Per Tube QHS   terazosin  5 mg Per Tube Daily   Continuous Infusions:  sodium chloride Stopped (11/23/21 1224)   cefTRIAXone (ROCEPHIN)  IV     metronidazole 100 mL/hr at 11/23/21 1314     LOS: 1 day    Time spent:    Erick Blinks, MD [facility]   If 7PM-7AM, please contact night-coverage [email or web address]  11/23/2021, 5:04 PM

## 2021-11-23 NOTE — Progress Notes (Signed)
Charles Weeks, M.D. Gastroenterology & Hepatology   Interval History:  No acute events overnight. Per nursing staff, he has presented continuous drainage from NG tube but this was not quantified medical chart. Sister is at the bedside.  Patient reports that he is still having abdominal pain and distention, for which he has requested frequent pain medicine.  Was moved to the chair at the side of his bed.  He denies having any nausea but has been burping frequently.  Has not been passing too much gas and had very scant amount of stool today in the morning.  Inpatient Medications:  Current Facility-Administered Medications:    0.9 %  sodium chloride infusion, , Intravenous, Continuous, Zierle-Ghosh, Asia B, DO, Last Rate: 125 mL/hr at 11/23/21 0838, Infusion Verify at 11/23/21 0838   albuterol (PROVENTIL) (2.5 MG/3ML) 0.083% nebulizer solution 2.5 mg, 2.5 mg, Nebulization, Q4H PRN, Zierle-Ghosh, Asia B, DO, 2.5 mg at 11/23/21 0917   arformoterol (BROVANA) nebulizer solution 15 mcg, 15 mcg, Nebulization, BID, Zierle-Ghosh, Asia B, DO, 15 mcg at 11/23/21 0917   busPIRone (BUSPAR) tablet 10 mg, 10 mg, Per Tube, TID, Memon, Jehanzeb, MD, 10 mg at 11/23/21 0914   Chlorhexidine Gluconate Cloth 2 % PADS 6 each, 6 each, Topical, Daily, Memon, Jehanzeb, MD, 6 each at 11/23/21 0914   ciprofloxacin (CIPRO) IVPB 400 mg, 400 mg, Intravenous, Q12H, Zierle-Ghosh, Asia B, DO, Stopped at 11/23/21 0113   clonazePAM (KLONOPIN) tablet 0.5 mg, 0.5 mg, Per Tube, QHS, Memon, Jehanzeb, MD, 0.5 mg at 11/22/21 2213   insulin aspart (novoLOG) injection 0-15 Units, 0-15 Units, Subcutaneous, TID WC, Zierle-Ghosh, Asia B, DO, 2 Units at 11/23/21 0754   insulin aspart (novoLOG) injection 0-5 Units, 0-5 Units, Subcutaneous, QHS, Zierle-Ghosh, Asia B, DO, 5 Units at 11/22/21 0217   metroNIDAZOLE (FLAGYL) IVPB 500 mg, 500 mg, Intravenous, Q12H, Zierle-Ghosh, Asia B, DO, Stopped at 11/23/21 0113   montelukast (SINGULAIR)  tablet 10 mg, 10 mg, Per Tube, QHS, Memon, Jehanzeb, MD, 10 mg at 11/22/21 2213   morphine (PF) 2 MG/ML injection 2 mg, 2 mg, Intravenous, Q2H PRN, Zierle-Ghosh, Asia B, DO, 2 mg at 11/23/21 0857   ondansetron (ZOFRAN) tablet 4 mg, 4 mg, Per Tube, Q6H PRN **OR** ondansetron (ZOFRAN) injection 4 mg, 4 mg, Intravenous, Q6H PRN, Memon, Jehanzeb, MD, 4 mg at 11/23/21 0857   Oral care mouth rinse, 15 mL, Mouth Rinse, 4 times per day, Zierle-Ghosh, Asia B, DO, 15 mL at 11/23/21 0838   Oral care mouth rinse, 15 mL, Mouth Rinse, PRN, Zierle-Ghosh, Asia B, DO   oxyCODONE (Oxy IR/ROXICODONE) immediate release tablet 5 mg, 5 mg, Per Tube, Q6H PRN, Memon, Jehanzeb, MD, 5 mg at 11/22/21 2213   pantoprazole (PROTONIX) injection 40 mg, 40 mg, Intravenous, Q24H, Mahon, Courtney L, NP, 40 mg at 11/23/21 0914   potassium chloride 10 mEq in 100 mL IVPB, 10 mEq, Intravenous, Q1 Hr x 4, Zierle-Ghosh, Asia B, DO, Last Rate: 100 mL/hr at 11/23/21 0937, 10 mEq at 11/23/21 0937   QUEtiapine (SEROQUEL) tablet 150 mg, 150 mg, Per Tube, BID, Memon, Jehanzeb, MD, 150 mg at 11/23/21 0914   QUEtiapine (SEROQUEL) tablet 300 mg, 300 mg, Per Tube, QHS, Memon, Jehanzeb, MD, 300 mg at 11/22/21 2212   terazosin (HYTRIN) capsule 5 mg, 5 mg, Per Tube, Daily, Memon, Jehanzeb, MD, 5 mg at 11/23/21 0914   I/O    Intake/Output Summary (Last 24 hours) at 11/23/2021 1028 Last data filed at 11/23/2021 0838 Gross per 24 hour    Intake 4370.29 ml  Output 3050 ml  Net 1320.29 ml     Physical Exam: Temp:  [97.8 F (36.6 C)-98.9 F (37.2 C)] 98.5 F (36.9 C) (07/08 0742) Pulse Rate:  [91-113] 98 (07/08 0400) Resp:  [11-24] 24 (07/08 0400) BP: (135-172)/(77-106) 145/106 (07/08 0400) SpO2:  [90 %-100 %] 93 % (07/08 0917) Weight:  [86.8 kg] 86.8 kg (07/08 0500)  Temp (24hrs), Avg:98.4 F (36.9 C), Min:97.8 F (36.6 C), Max:98.9 F (37.2 C) GENERAL: The patient is AO x3, in no acute distress. Has NG tube with active drainage. HEENT: Head  is normocephalic and atraumatic. EOMI are intact. Mouth is well hydrated and without lesions. NECK: Supple. No masses LUNGS: Clear to auscultation. No presence of rhonchi/wheezing/rales. Adequate chest expansion HEART: RRR, normal s1 and s2. ABDOMEN: Mildly tender diffusely, no guarding, no peritoneal signs.  Diffusely distended and tympanic. BS are decreased. No masses. EXTREMITIES: Without any cyanosis, clubbing, rash, lesions or edema. NEUROLOGIC: AOx3, no focal motor deficit. SKIN: no jaundice, no rashes  Laboratory Data: CBC:     Component Value Date/Time   WBC 13.6 (H) 11/23/2021 0341   RBC 4.43 11/23/2021 0341   HGB 13.2 11/23/2021 0341   HCT 40.4 11/23/2021 0341   PLT 199 11/23/2021 0341   MCV 91.2 11/23/2021 0341   MCH 29.8 11/23/2021 0341   MCHC 32.7 11/23/2021 0341   RDW 13.3 11/23/2021 0341   LYMPHSABS 0.7 11/22/2021 0357   MONOABS 0.9 11/22/2021 0357   EOSABS 0.1 11/22/2021 0357   BASOSABS 0.0 11/22/2021 0357   COAG:  Lab Results  Component Value Date   INR 1.2 08/17/2020    BMP:     Latest Ref Rng & Units 11/23/2021    3:41 AM 11/22/2021    3:57 AM 11/21/2021    7:08 PM  BMP  Glucose 70 - 99 mg/dL 123  135  252   BUN 6 - 20 mg/dL _0 Creatinine 0.61 - 1.24 mg/dL 0.76  1.07  1.41   Sodium 135 - 145 mmol/L 140  141  141   Potassium 3.5 - 5.1 mmol/L 3.2  3.9  4.2   Chloride 98 - 111 mmol/L 104  106  101   CO2 22 - 32 mmol/L _1 Calcium 8.9 - 10.3 mg/dL 8.1  8.7  9.4     HEPATIC:     Latest Ref Rng & Units 11/22/2021    3:57 AM 11/21/2021    7:08 PM 10/10/2020    6:23 AM  Hepatic Function  Total Protein 6.5 - 8.1 g/dL 6.8  8.0  6.9   Albumin 3.5 - 5.0 g/dL 3.8  4.6  3.4   AST 15 - 41 U/L _2 ALT 0 - 44 U/L _3 Alk Phosphatase 38 - 126 U/L 116  143  105   Total Bilirubin 0.3 - 1.2 mg/dL 0.5  0.6  0.7     CARDIAC: No results found for: "CKTOTAL", "CKMB", "CKMBINDEX", "TROPONINI"    Imaging: I personally reviewed and  interpreted the available labs, imaging and endoscopic files.   Assessment/Plan: 56 year old male with past medical history of asthma, COPD, BPH, depression, hypertension, coronary artery disease, myocardial infarction, chronic neck and back pain due to a fall and status post tracheostomy on home oxygen, who was brought to the hospital by his sister after presenting worsening shortness of breath, diaphoresis and  fatigue.  Patient was found to have significant abdominal distention upon admission along with persistent vomiting episodes.  A CT of the abdomen with IV contrast showed presence of possible colitis as there was thickening from the splenic flexure through the ascending colon along with gastric and small bowel distention without areas of acute obstruction or masses, which was concerning for ileus.  Due to this, patient was Admitted to the ICU for further monitoring.  He was started on IV antibiotics and an NG tube was placed to decompress his upper gastrointestinal tract.  His current presentation is concerning for an ileus that was exacerbated by his chronic opiate use.  He is currently undergoing medical management with NG tube drainage and mobilization.  I had a thorough discussion with the patient and the sister regarding the importance of minimizing the use of opiates and increasing his mobilization as this may help increase his peristalsis.  Ideally, although intervention should be avoided, I discussed that endoscopic decompression versus use of neostigmine are options for treatment but they have higher risk of complications which they understand.  Clinically, he has not presented any more episodes of bleeding and his hemoglobin has remained stable.  No further evaluation of this is warranted at this moment.  -Continue drainage through NG tube -Consider mobilization to the sites and if possible insist on ambulation -Minimize opiate use -Correct electrolytes, potassium should be more than 3.5  and calcium more than 9 -Check H&H daily   Castaneda, MD Gastroenterology and Hepatology [facility]  

## 2021-11-23 NOTE — Progress Notes (Signed)
Pt notified nurse that he had a rash and hives developing on his left arm. Patient was receiving ciprofloxacin (less than half infused). Cipro stopped and IV line flushed out with saline. Continuous fluids restarted and Dr. Kerry Hough notified. Benadryl to be ordered. Patient assisted back to bed and is resting comfortably.

## 2021-11-24 ENCOUNTER — Inpatient Hospital Stay (HOSPITAL_COMMUNITY): Payer: Medicaid Other

## 2021-11-24 DIAGNOSIS — R6510 Systemic inflammatory response syndrome (SIRS) of non-infectious origin without acute organ dysfunction: Secondary | ICD-10-CM | POA: Diagnosis not present

## 2021-11-24 DIAGNOSIS — N179 Acute kidney failure, unspecified: Secondary | ICD-10-CM | POA: Diagnosis not present

## 2021-11-24 DIAGNOSIS — G894 Chronic pain syndrome: Secondary | ICD-10-CM | POA: Diagnosis not present

## 2021-11-24 DIAGNOSIS — K922 Gastrointestinal hemorrhage, unspecified: Secondary | ICD-10-CM | POA: Diagnosis not present

## 2021-11-24 DIAGNOSIS — K529 Noninfective gastroenteritis and colitis, unspecified: Secondary | ICD-10-CM | POA: Diagnosis not present

## 2021-11-24 LAB — RENAL FUNCTION PANEL
Albumin: 3 g/dL — ABNORMAL LOW (ref 3.5–5.0)
Anion gap: 7 (ref 5–15)
BUN: 7 mg/dL (ref 6–20)
CO2: 29 mmol/L (ref 22–32)
Calcium: 8.2 mg/dL — ABNORMAL LOW (ref 8.9–10.3)
Chloride: 106 mmol/L (ref 98–111)
Creatinine, Ser: 0.78 mg/dL (ref 0.61–1.24)
GFR, Estimated: >60 mL/min (ref >60)
Glucose, Bld: 103 mg/dL — ABNORMAL HIGH (ref 70–99)
Phosphorus: 2.7 mg/dL (ref 2.5–4.6)
Potassium: 3.3 mmol/L — ABNORMAL LOW (ref 3.5–5.1)
Sodium: 142 mmol/L (ref 135–145)

## 2021-11-24 LAB — GLUCOSE, CAPILLARY
Glucose-Capillary: 108 mg/dL — ABNORMAL HIGH (ref 70–99)
Glucose-Capillary: 104 mg/dL — ABNORMAL HIGH (ref 70–99)
Glucose-Capillary: 84 mg/dL (ref 70–99)
Glucose-Capillary: 82 mg/dL (ref 70–99)
Glucose-Capillary: 81 mg/dL (ref 70–99)

## 2021-11-24 LAB — CBC
HCT: 37.7 % — ABNORMAL LOW (ref 39.0–52.0)
Hemoglobin: 12.3 g/dL — ABNORMAL LOW (ref 13.0–17.0)
MCH: 29.8 pg (ref 26.0–34.0)
MCHC: 32.6 g/dL (ref 30.0–36.0)
MCV: 91.3 fL (ref 80.0–100.0)
Platelets: 220 10*3/uL (ref 150–400)
RBC: 4.13 MIL/uL — ABNORMAL LOW (ref 4.22–5.81)
RDW: 13.2 % (ref 11.5–15.5)
WBC: 11.6 10*3/uL — ABNORMAL HIGH (ref 4.0–10.5)
nRBC: 0 % (ref 0.0–0.2)

## 2021-11-24 LAB — MAGNESIUM: Magnesium: 1.9 mg/dL (ref 1.7–2.4)

## 2021-11-24 MED ORDER — POTASSIUM CHLORIDE 10 MEQ/100ML IV SOLN
10.0000 meq | INTRAVENOUS | Status: AC
  Administered 2021-11-24 (×4): 10 meq via INTRAVENOUS
  Filled 2021-11-24 (×4): qty 100

## 2021-11-24 MED ORDER — BETHANECHOL CHLORIDE 25 MG PO TABS
50.0000 mg | ORAL_TABLET | Freq: Four times a day (QID) | ORAL | Status: DC
  Administered 2021-11-24 – 2021-11-28 (×14): 50 mg
  Filled 2021-11-24 (×22): qty 2

## 2021-11-24 NOTE — Progress Notes (Signed)
Charles Weeks, M.D. Gastroenterology & Hepatology   Interval History:  No acute events overnight. Still having significant amount of output from NG tube, 660 cc in the last 24 hours. Patient reported feeling better but is still distended in his abdomen.  No more vomiting episodes.  He denies having any abdominal pain but he has requested morphine frequently per nursing staff.  Had scant amount of stool today which had a little bit of fresh blood. KUB today did not show any obstruction and no significant bowel distention.  Inpatient Medications:  Current Facility-Administered Medications:    0.9 %  sodium chloride infusion, , Intravenous, Continuous, Zierle-Ghosh, Asia B, DO, Last Rate: 125 mL/hr at 11/24/21 0730, New Bag at 11/24/21 0730   albuterol (PROVENTIL) (2.5 MG/3ML) 0.083% nebulizer solution 2.5 mg, 2.5 mg, Nebulization, Q4H PRN, Zierle-Ghosh, Asia B, DO, 2.5 mg at 11/24/21 7048   arformoterol (BROVANA) nebulizer solution 15 mcg, 15 mcg, Nebulization, BID, Zierle-Ghosh, Asia B, DO, 15 mcg at 11/24/21 8891   bethanechol (URECHOLINE) tablet 50 mg, 50 mg, Per Tube, QID, Kathie Dike, MD   busPIRone (BUSPAR) tablet 10 mg, 10 mg, Per Tube, TID, Kathie Dike, MD, 10 mg at 11/24/21 1004   cefTRIAXone (ROCEPHIN) 2 g in sodium chloride 0.9 % 100 mL IVPB, 2 g, Intravenous, Q24H, Memon, Jolaine Artist, MD, Stopped at 11/23/21 1742   Chlorhexidine Gluconate Cloth 2 % PADS 6 each, 6 each, Topical, Daily, Kathie Dike, MD, 6 each at 11/24/21 1000   clonazePAM (KLONOPIN) tablet 0.5 mg, 0.5 mg, Per Tube, QHS, Memon, Jolaine Artist, MD, 0.5 mg at 11/23/21 2107   diphenhydrAMINE (BENADRYL) injection 25 mg, 25 mg, Intravenous, Q6H PRN, Kathie Dike, MD, 25 mg at 11/23/21 1432   insulin aspart (novoLOG) injection 0-15 Units, 0-15 Units, Subcutaneous, TID WC, Zierle-Ghosh, Asia B, DO, 2 Units at 11/23/21 1147   insulin aspart (novoLOG) injection 0-5 Units, 0-5 Units, Subcutaneous, QHS, Zierle-Ghosh,  Asia B, DO, 5 Units at 11/22/21 0217   metroNIDAZOLE (FLAGYL) IVPB 500 mg, 500 mg, Intravenous, Q12H, Zierle-Ghosh, Asia B, DO, Last Rate: 100 mL/hr at 11/24/21 1139, 500 mg at 11/24/21 1139   montelukast (SINGULAIR) tablet 10 mg, 10 mg, Per Tube, QHS, Memon, Jolaine Artist, MD, 10 mg at 11/23/21 2107   morphine (PF) 2 MG/ML injection 2 mg, 2 mg, Intravenous, Q2H PRN, Zierle-Ghosh, Asia B, DO, 2 mg at 11/24/21 0732   ondansetron (ZOFRAN) tablet 4 mg, 4 mg, Per Tube, Q6H PRN **OR** ondansetron (ZOFRAN) injection 4 mg, 4 mg, Intravenous, Q6H PRN, Kathie Dike, MD, 4 mg at 11/23/21 0857   Oral care mouth rinse, 15 mL, Mouth Rinse, 4 times per day, Zierle-Ghosh, Asia B, DO, 15 mL at 11/24/21 0736   Oral care mouth rinse, 15 mL, Mouth Rinse, PRN, Zierle-Ghosh, Asia B, DO   oxyCODONE (Oxy IR/ROXICODONE) immediate release tablet 5 mg, 5 mg, Per Tube, Q6H PRN, Kathie Dike, MD, 5 mg at 11/22/21 2213   pantoprazole (PROTONIX) injection 40 mg, 40 mg, Intravenous, Q24H, Mahon, Courtney L, NP, 40 mg at 11/24/21 1005   QUEtiapine (SEROQUEL) tablet 150 mg, 150 mg, Per Tube, BID, Memon, Jehanzeb, MD, 150 mg at 11/24/21 1004   QUEtiapine (SEROQUEL) tablet 300 mg, 300 mg, Per Tube, QHS, Memon, Jolaine Artist, MD, 300 mg at 11/23/21 2107   terazosin (HYTRIN) capsule 5 mg, 5 mg, Per Tube, Daily, Memon, Jolaine Artist, MD, 5 mg at 11/24/21 1004   I/O    Intake/Output Summary (Last 24 hours) at 11/24/2021 1157 Last data filed at 11/24/2021  7011 Gross per 24 hour  Intake 1302.43 ml  Output 2950 ml  Net -1647.57 ml     Physical Exam: Temp:  [98.5 F (36.9 C)-99.9 F (37.7 C)] 98.7 F (37.1 C) (07/09 1124) Pulse Rate:  [86-107] 86 (07/09 1025) Resp:  [12-22] 22 (07/09 1025) BP: (132-170)/(49-91) 150/84 (07/09 1025) SpO2:  [97 %-100 %] 98 % (07/09 1025)  Temp (24hrs), Avg:99 F (37.2 C), Min:98.5 F (36.9 C), Max:99.9 F (37.7 C) GENERAL: The patient is AO x3, in no acute distress. Has NG tube with active  drainage. HEENT: Head is normocephalic and atraumatic. EOMI are intact. Mouth is well hydrated and without lesions. NECK: Supple. No masses LUNGS: Clear to auscultation. No presence of rhonchi/wheezing/rales. Adequate chest expansion HEART: RRR, normal s1 and s2. ABDOMEN: non tender, no guarding, no peritoneal signs.  Diffusely distended and tympanic. BS are decreased. No masses. EXTREMITIES: Without any cyanosis, clubbing, rash, lesions or edema. NEUROLOGIC: AOx3, no focal motor deficit. SKIN: no jaundice, no rashes  Laboratory Data: CBC:     Component Value Date/Time   WBC 11.6 (H) 11/24/2021 0354   RBC 4.13 (L) 11/24/2021 0354   HGB 12.3 (L) 11/24/2021 0354   HCT 37.7 (L) 11/24/2021 0354   PLT 220 11/24/2021 0354   MCV 91.3 11/24/2021 0354   MCH 29.8 11/24/2021 0354   MCHC 32.6 11/24/2021 0354   RDW 13.2 11/24/2021 0354   LYMPHSABS 0.7 11/22/2021 0357   MONOABS 0.9 11/22/2021 0357   EOSABS 0.1 11/22/2021 0357   BASOSABS 0.0 11/22/2021 0357   COAG:  Lab Results  Component Value Date   INR 1.2 08/17/2020    BMP:     Latest Ref Rng & Units 11/24/2021    3:54 AM 11/23/2021    3:41 AM 11/22/2021    3:57 AM  BMP  Glucose 70 - 99 mg/dL 103  123  135   BUN 6 - 20 mg/dL _0 Creatinine 0.61 - 1.24 mg/dL 0.78  0.76  1.07   Sodium 135 - 145 mmol/L 142  140  141   Potassium 3.5 - 5.1 mmol/L 3.3  3.2  3.9   Chloride 98 - 111 mmol/L 106  104  106   CO2 22 - 32 mmol/L _1 Calcium 8.9 - 10.3 mg/dL 8.2  8.1  8.7     HEPATIC:     Latest Ref Rng & Units 11/24/2021    3:54 AM 11/22/2021    3:57 AM 11/21/2021    7:08 PM  Hepatic Function  Total Protein 6.5 - 8.1 g/dL  6.8  8.0   Albumin 3.5 - 5.0 g/dL 3.0  3.8  4.6   AST 15 - 41 U/L  20  25   ALT 0 - 44 U/L  21  27   Alk Phosphatase 38 - 126 U/L  116  143   Total Bilirubin 0.3 - 1.2 mg/dL  0.5  0.6     CARDIAC: No results found for: "CKTOTAL", "CKMB", "CKMBINDEX", "TROPONINI"    Imaging: I personally reviewed and  interpreted the available labs, imaging and endoscopic files.   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  Patient was found  to have significant abdominal distention upon admission along with persistent vomiting episodes.  A CT of the abdomen with IV contrast showed presence of possible colitis as there was thickening from the splenic flexure through the ascending colon along with gastric and small bowel distention without areas of acute obstruction or masses, which was concerning for ileus.  Due to this, patient was Admitted to the ICU for further monitoring.  He was started on IV antibiotics and an NG tube was placed to decompress his upper gastrointestinal tract.   His current presentation is concerning for an ileus that was exacerbated by his chronic opiate use.  He is currently undergoing medical management with NG tube drainage and mobilization.  Once the NG tube drainage has decreased significantly, can consider starting a trial of clear liquids after the NG tube has been clamped.  I had a thorough discussion with the patient and the sister regarding the importance of minimizing the use of opiates and increasing his mobilization as this may help increase his peristalsis.  Ideally, although intervention should be avoided, I discussed that endoscopic decompression versus use of neostigmine are options for treatment but they have higher risk of complications which they understand.   Clinically, he has not presented any more episodes of bleeding and his hemoglobin has remained stable.  No further evaluation of this is warranted at this moment.   -Continue drainage through NG tube -Consider  mobilization to the sites and if possible insist on ambulation -Minimize opiate use -Correct electrolytes, potassium should be more than 3.5 and calcium more than 9 -Check H&H daily   Charles Peppers, MD Gastroenterology and Hepatology [facility]

## 2021-11-24 NOTE — Progress Notes (Signed)
PROGRESS NOTE    Charles Weeks  IRJ:[id number] DOB: 10-15-1965 DOA: 11/21/2021 PCP: Anabel Halon, MD    Brief Narrative:  56 year old male with a history of COPD, chronic respiratory failure on 3 L of oxygen, tracheostomy dependent, depression, anxiety, admitted to the hospital with shortness of breath.  Although he did not have any significant pneumonia on chest imaging, he was noted to have possible colitis and ileus on CT abdomen.  He was admitted for further evaluation and started on antibiotics.  Seen by GI.  Started to have vomiting and had NG tube placed for decompression.   Assessment & Plan:   Principal Problem:   SIRS (systemic inflammatory response syndrome) (HCC) Active Problems:   COPD (chronic obstructive pulmonary disease) (HCC)   Depression with anxiety   Colitis   AKI (acute kidney injury) (HCC)   Chronic pain   GERD (gastroesophageal reflux disease)   HLD (hyperlipidemia)   GI bleed   Sepsis, present on admission, ruled in -On admission: Meets sepsis criteria with a white blood cell count of 18.3, heart rate 110, respiratory rate 25, AKI, and source being colitis -Started on Cipro/Flagyl.  Cipro discontinued since he developed hives and patient started on ceftriaxone -Lactic acid normal -Hydrated with IV fluids -Blood cultures have not shown any growth  Colitis -Unclear etiology, possibly infectious -On antibiotics -Stool studies been ordered  Ileus -Continues to have abdominal distention and no significant bowel movements -NG tube in place for decompression -Still having significant output from NG tube -GI following -May need colonoscopy versus rivastigmine if he does not improve -Patient is ambulating around the unit -Limit opiates as much as possible  Acute kidney injury -Related to dehydration -Improved with IV fluids  COPD -Initially had elevated PCO2 on admission -This has improved back to normal range -Continue bronchodilators,  Singulair -No wheezing at this time  Chronic respiratory failure with hypoxia -He is trach dependent and chronically on 3 L of oxygen  Heme positive stools -Possibly related to colitis -Hemoglobin currently stable -GI following  Chronic pain syndrome -Continue pain medications as needed -Holding oxycodone for now  Depression, anxiety -Continue on BuSpar, Klonopin and Seroquel -Wellbutrin XL currently on hold since it cannot be put down NG tube.  Urinary retention -Per staff/family, patient required straight cath to be done at home every 6 hours -Reviewed and it showed that he is on bethanechol, will continue -Continue In-N-Out cath as needed  Hypokalemia -Replace  DVT prophylaxis: SCDs Start: 11/22/21 0139  Code Status: Full code Family Communication: Updated sister at the bedside Disposition Plan: Status is: Inpatient Remains inpatient appropriate because: Continued management of ileus, currently has NG tube for decompression, await return of bowel function     Consultants:  GI  Procedures:    Antimicrobials:  Cipro 7/7 > 7/8 Ceftriaxone 7/8 > Flagyl 7/7>    Subjective: Continues to have urinary output through NG tube.  He has not had any substantial bowel movement.  Reports that he is passing some gas.  Objective: Vitals:   11/24/21 0822 11/24/21 1025 11/24/21 1124 11/24/21 1223  BP:  (!) 150/84    Pulse:  86    Resp:  (!) 22    Temp:   98.7 F (37.1 C)   TempSrc:   Oral   SpO2: 98% 98%  100%  Weight:      Height:        Intake/Output Summary (Last 24 hours) at 11/24/2021 1248 Last data filed at 11/24/2021 0752 Gross per  24 hour  Intake 1302.43 ml  Output 2150 ml  Net -847.57 ml   Filed Weights   11/21/21 1824 11/22/21 0505 11/23/21 0500  Weight: 89 kg 88.1 kg 86.8 kg    Examination:  General exam: Appears calm and comfortable  Respiratory system: Clear to auscultation. Respiratory effort normal. Cardiovascular system: S1 & S2 heard,  RRR. No JVD, murmurs, rubs, gallops or clicks. No pedal edema. Gastrointestinal system: Abdomen is distended, soft and nontender. No organomegaly or masses felt.  Hypoactive bowel sounds. NG tube remains in place Central nervous system: Alert and oriented. No focal neurological deficits. Extremities: Symmetric 5 x 5 power. Skin: No rashes, lesions or ulcers Psychiatry: Judgement and insight appear normal. Mood & affect appropriate.     Data Reviewed: I have personally reviewed following labs and imaging studies  CBC: Recent Labs  Lab 11/21/21 1908 11/22/21 0357 11/23/21 0341 11/24/21 0354  WBC 18.3* 16.3* 13.6* 11.6*  NEUTROABS 14.6* 14.4*  --   --   HGB 15.6 14.1 13.2 12.3*  HCT 48.3 42.7 40.4 37.7*  MCV 92.2 91.2 91.2 91.3  PLT 236 213 199 220   Basic Metabolic Panel: Recent Labs  Lab 11/21/21 1908 11/22/21 0357 11/23/21 0341 11/24/21 0354  NA 141 141 140 142  K 4.2 3.9 3.2* 3.3*  CL 101 106 104 106  CO2 28 27 28 29   GLUCOSE 252* 135* 123* 103*  BUN 15 15 7 7   CREATININE 1.41* 1.07 0.76 0.78  CALCIUM 9.4 8.7* 8.1* 8.2*  MG  --  2.1  --  1.9  PHOS  --   --   --  2.7   GFR: Estimated Creatinine Clearance: 111.6 mL/min (by C-G formula based on SCr of 0.78 mg/dL). Liver Function Tests: Recent Labs  Lab 11/21/21 1908 11/22/21 0357 11/24/21 0354  AST 25 20  --   ALT 27 21  --   ALKPHOS 143* 116  --   BILITOT 0.6 0.5  --   PROT 8.0 6.8  --   ALBUMIN 4.6 3.8 3.0*   No results for input(s): "LIPASE", "AMYLASE" in the last 168 hours. No results for input(s): "AMMONIA" in the last 168 hours. Coagulation Profile: No results for input(s): "INR", "PROTIME" in the last 168 hours. Cardiac Enzymes: No results for input(s): "CKTOTAL", "CKMB", "CKMBINDEX", "TROPONINI" in the last 168 hours. BNP (last 3 results) No results for input(s): "PROBNP" in the last 8760 hours. HbA1C: Recent Labs    11/22/21 0357  HGBA1C 6.3*   CBG: Recent Labs  Lab 11/23/21 1119  11/23/21 1613 11/23/21 2104 11/24/21 0733 11/24/21 1129  GLUCAP 142* 105* 104* 108* 104*   Lipid Profile: No results for input(s): "CHOL", "HDL", "LDLCALC", "TRIG", "CHOLHDL", "LDLDIRECT" in the last 72 hours. Thyroid Function Tests: No results for input(s): "TSH", "T4TOTAL", "FREET4", "T3FREE", "THYROIDAB" in the last 72 hours. Anemia Panel: No results for input(s): "VITAMINB12", "FOLATE", "FERRITIN", "TIBC", "IRON", "RETICCTPCT" in the last 72 hours. Sepsis Labs: Recent Labs  Lab 11/22/21 0357  LATICACIDVEN 1.9    Recent Results (from the past 240 hour(s))  Culture, blood (Routine X 2) w Reflex to ID Panel     Status: None (Preliminary result)   Collection Time: 11/22/21  6:40 AM   Specimen: BLOOD LEFT HAND  Result Value Ref Range Status   Specimen Description BLOOD LEFT HAND  Final   Special Requests   Final    BOTTLES DRAWN AEROBIC AND ANAEROBIC Blood Culture adequate volume   Culture   Final  NO GROWTH 2 DAYS Performed at [facility], [address], [place], [place] [address]    Report Status PENDING  Incomplete  Culture, blood (Routine X 2) w Reflex to ID Panel     Status: None (Preliminary result)   Collection Time: 11/22/21  6:40 AM   Specimen: Left Antecubital; Blood  Result Value Ref Range Status   Specimen Description LEFT ANTECUBITAL  Final   Special Requests   Final    BOTTLES DRAWN AEROBIC AND ANAEROBIC Blood Culture adequate volume   Culture   Final    NO GROWTH 2 DAYS Performed at [facility], [address], [place], [place] [address]    Report Status PENDING  Incomplete  MRSA Next Gen by PCR, Nasal     Status: None   Collection Time: 11/22/21  3:36 PM   Specimen: Nasal Mucosa; Nasal Swab  Result Value Ref Range Status   MRSA by PCR Next Gen NOT DETECTED NOT DETECTED Final    Comment: (NOTE) The GeneXpert MRSA Assay (FDA approved for NASAL specimens only), is one component of a comprehensive MRSA colonization surveillance program. It is not  intended to diagnose MRSA infection nor to guide or monitor treatment for MRSA infections. Test performance is not FDA approved in patients less than 60 years old. Performed at [facility], [address], [place], [place] [address]          Radiology Studies: DG Abd 1 View  Result Date: 11/24/2021 CLINICAL DATA:  Abdominal distension. EXAM: ABDOMEN - 1 VIEW COMPARISON:  11/23/2021, CT 11/21/2021. FINDINGS: There is no bowel dilation to suggest obstruction. Nasal/orogastric tube extends well into the stomach, unchanged. No convincing renal or ureteral stones. Soft tissues are unremarkable. IMPRESSION: 1. No acute findings.  No evidence of bowel obstruction. Electronically Signed   By: Amie Portland M.D.   On: 11/24/2021 08:47   DG Abd 1 View  Result Date: 11/23/2021 CLINICAL DATA:  Check nasogastric catheter placement EXAM: ABDOMEN - 1 VIEW COMPARISON:  None Available. FINDINGS: Cardiac shadow is within normal limits. Gastric catheter is noted extending into the stomach. Tip of the tracheostomy tube is noted in satisfactory position. Lungs are hypoinflated. IMPRESSION: Gastric catheter within the stomach. Electronically Signed   By: Alcide Clever M.D.   On: 11/23/2021 00:50   DG Abd 1 View  Result Date: 11/22/2021 CLINICAL DATA:  Enteric catheter placement EXAM: ABDOMEN - 1 VIEW COMPARISON:  08/31/2020 FINDINGS: Frontal view of the lower chest and upper abdomen demonstrates enteric catheter passing below diaphragm, tip and side port projecting over gastric body. Bowel gas pattern is unremarkable. Lung bases are clear. IMPRESSION: 1. Enteric catheter projecting over gastric body. Electronically Signed   By: Sharlet Salina M.D.   On: 11/22/2021 17:55        Scheduled Meds:  arformoterol  15 mcg Nebulization BID   bethanechol  50 mg Per Tube QID   busPIRone  10 mg Per Tube TID   Chlorhexidine Gluconate Cloth  6 each Topical Daily   clonazePAM  0.5 mg Per Tube QHS   insulin aspart  0-15  Units Subcutaneous TID WC   insulin aspart  0-5 Units Subcutaneous QHS   montelukast  10 mg Per Tube QHS   mouth rinse  15 mL Mouth Rinse 4 times per day   pantoprazole (PROTONIX) IV  40 mg Intravenous Q24H   QUEtiapine  150 mg Per Tube BID   QUEtiapine  300 mg Per Tube QHS   terazosin  5 mg Per Tube Daily   Continuous Infusions:  sodium chloride 125 mL/hr at 11/24/21 0730   cefTRIAXone (ROCEPHIN)  IV Stopped (11/23/21 1742)   metronidazole 500 mg (11/24/21 1139)   potassium chloride       LOS: 2 days    Time spent:    Erick Blinks, MD [facility]   If 7PM-7AM, please contact night-coverage [email or web address]  11/24/2021, 12:48 PM

## 2021-11-25 ENCOUNTER — Inpatient Hospital Stay (HOSPITAL_COMMUNITY): Payer: Medicaid Other

## 2021-11-25 DIAGNOSIS — K529 Noninfective gastroenteritis and colitis, unspecified: Secondary | ICD-10-CM | POA: Diagnosis not present

## 2021-11-25 DIAGNOSIS — N179 Acute kidney failure, unspecified: Secondary | ICD-10-CM | POA: Diagnosis not present

## 2021-11-25 DIAGNOSIS — G894 Chronic pain syndrome: Secondary | ICD-10-CM | POA: Diagnosis not present

## 2021-11-25 DIAGNOSIS — K567 Ileus, unspecified: Secondary | ICD-10-CM | POA: Diagnosis not present

## 2021-11-25 DIAGNOSIS — R1110 Vomiting, unspecified: Secondary | ICD-10-CM | POA: Diagnosis not present

## 2021-11-25 DIAGNOSIS — R6510 Systemic inflammatory response syndrome (SIRS) of non-infectious origin without acute organ dysfunction: Secondary | ICD-10-CM | POA: Diagnosis not present

## 2021-11-25 LAB — BASIC METABOLIC PANEL
Anion gap: 7 (ref 5–15)
BUN: 8 mg/dL (ref 6–20)
CO2: 29 mmol/L (ref 22–32)
Calcium: 8.3 mg/dL — ABNORMAL LOW (ref 8.9–10.3)
Chloride: 105 mmol/L (ref 98–111)
Creatinine, Ser: 0.8 mg/dL (ref 0.61–1.24)
GFR, Estimated: >60 mL/min (ref >60)
Glucose, Bld: 74 mg/dL (ref 70–99)
Potassium: 3.4 mmol/L — ABNORMAL LOW (ref 3.5–5.1)
Sodium: 141 mmol/L (ref 135–145)

## 2021-11-25 LAB — GLUCOSE, CAPILLARY
Glucose-Capillary: 76 mg/dL (ref 70–99)
Glucose-Capillary: 75 mg/dL (ref 70–99)
Glucose-Capillary: 75 mg/dL (ref 70–99)

## 2021-11-25 LAB — CBC
HCT: 35.1 % — ABNORMAL LOW (ref 39.0–52.0)
Hemoglobin: 11.5 g/dL — ABNORMAL LOW (ref 13.0–17.0)
MCH: 29.9 pg (ref 26.0–34.0)
MCHC: 32.8 g/dL (ref 30.0–36.0)
MCV: 91.2 fL (ref 80.0–100.0)
Platelets: 208 10*3/uL (ref 150–400)
RBC: 3.85 MIL/uL — ABNORMAL LOW (ref 4.22–5.81)
RDW: 13 % (ref 11.5–15.5)
WBC: 10.3 10*3/uL (ref 4.0–10.5)
nRBC: 0 % (ref 0.0–0.2)

## 2021-11-25 MED ORDER — DIATRIZOATE MEGLUMINE & SODIUM 66-10 % PO SOLN
ORAL | Status: AC
  Filled 2021-11-25: qty 90

## 2021-11-25 MED ORDER — DIATRIZOATE MEGLUMINE & SODIUM 66-10 % PO SOLN
90.0000 mL | Freq: Once | ORAL | Status: AC
  Administered 2021-11-25: 90 mL via NASOGASTRIC
  Filled 2021-11-25: qty 90

## 2021-11-25 MED ORDER — SODIUM CHLORIDE 0.9 % IV SOLN: INTRAVENOUS | Status: DC

## 2021-11-25 MED ORDER — POTASSIUM CHLORIDE 10 MEQ/100ML IV SOLN
10.0000 meq | INTRAVENOUS | Status: AC
  Administered 2021-11-25 (×3): 10 meq via INTRAVENOUS
  Filled 2021-11-25 (×2): qty 100

## 2021-11-25 MED ORDER — BISACODYL 10 MG RE SUPP
10.0000 mg | Freq: Once | RECTAL | Status: AC
  Administered 2021-11-25: 10 mg via RECTAL
  Filled 2021-11-25: qty 1

## 2021-11-25 NOTE — Progress Notes (Signed)
Gastroenterology Progress Note    Primary Care Physician:  Anabel Halon, MD Primary Gastroenterologist:  Dr. Levon Hedger   Patient ID: Charles Weeks; [id number]; Sep 11, 1965    Subjective   Patient sitting on bedside commode to urinate. Per sister, he had a small bowel movement this morning. Passing flatus. Abdominal discomfort same. NG tube in place. When initially placed, appears was documented on 7/8 at 1500. Following day, 7/9, 800 ml documented aruond 1140am. Last documentation of output was 200 ml yesterday evening.    Objective   Vital signs in last 24 hours Temp:  [98.3 F (36.8 C)-99.3 F (37.4 C)] 98.3 F (36.8 C) (07/10 0542) Pulse Rate:  [86-101] 90 (07/10 0542) Resp:  [16-22] 18 (07/10 0542) BP: (143-173)/(82-139) 145/82 (07/10 0542) SpO2:  [97 %-100 %] 98 % (07/10 0731) Weight:  [85.8 kg] 85.8 kg (07/09 2043) Last BM Date : 11/22/21  Physical Exam General:   Alert and oriented, appears older than stated age. Trach in place.  Head:  Normocephalic and atraumatic. Abdomen:  Bowel sounds present, distended Extremities:  Without  edema. Neurologic:  Alert and  oriented x4   Intake/Output from previous day: 07/09 0701 - 07/10 0700 In: 2055.3 [I.V.:1083.1; NG/GT:60; IV Piggyback:912.1] Out: 2300 [Urine:1300; Emesis/NG output:1000] Intake/Output this shift: No intake/output data recorded.  Lab Results  Recent Labs    11/23/21 0341 11/24/21 0354 11/25/21 0413  WBC 13.6* 11.6* 10.3  HGB 13.2 12.3* 11.5*  HCT 40.4 37.7* 35.1*  PLT 199 220 208   BMET Recent Labs    11/23/21 0341 11/24/21 0354 11/25/21 0413  NA 140 142 141  K 3.2* 3.3* 3.4*  CL 104 106 105  CO2 28 29 29   GLUCOSE 123* 103* 74  BUN 7 7 8   CREATININE 0.76 0.78 0.80  CALCIUM 8.1* 8.2* 8.3*   LFT Recent Labs    11/24/21 0354  ALBUMIN 3.0*     Studies/Results DG Abd 1 View  Result Date: 11/24/2021 CLINICAL DATA:  Abdominal distension. EXAM: ABDOMEN - 1 VIEW  COMPARISON:  11/23/2021, CT 11/21/2021. FINDINGS: There is no bowel dilation to suggest obstruction. Nasal/orogastric tube extends well into the stomach, unchanged. No convincing renal or ureteral stones. Soft tissues are unremarkable. IMPRESSION: 1. No acute findings.  No evidence of bowel obstruction. Electronically Signed   By: 01/24/2022 M.D.   On: 11/24/2021 08:47   DG Abd 1 View  Result Date: 11/23/2021 CLINICAL DATA:  Check nasogastric catheter placement EXAM: ABDOMEN - 1 VIEW COMPARISON:  None Available. FINDINGS: Cardiac shadow is within normal limits. Gastric catheter is noted extending into the stomach. Tip of the tracheostomy tube is noted in satisfactory position. Lungs are hypoinflated. IMPRESSION: Gastric catheter within the stomach. Electronically Signed   By: 01/25/2022 M.D.   On: 11/23/2021 00:50   DG Abd 1 View  Result Date: 11/22/2021 CLINICAL DATA:  Enteric catheter placement EXAM: ABDOMEN - 1 VIEW COMPARISON:  08/31/2020 FINDINGS: Frontal view of the lower chest and upper abdomen demonstrates enteric catheter passing below diaphragm, tip and side port projecting over gastric body. Bowel gas pattern is unremarkable. Lung bases are clear. IMPRESSION: 1. Enteric catheter projecting over gastric body. Electronically Signed   By: 01/23/2022 M.D.   On: 11/22/2021 17:55   CT Abdomen Pelvis W Contrast  Result Date: 11/22/2021 CLINICAL DATA:  Acute abdominal pain. EXAM: CT ABDOMEN AND PELVIS WITH CONTRAST TECHNIQUE: Multidetector CT imaging of the abdomen and pelvis was performed using the standard protocol  following bolus administration of intravenous contrast. RADIATION DOSE REDUCTION: This exam was performed according to the departmental dose-optimization program which includes automated exposure control, adjustment of the mA and/or kV according to patient size and/or use of iterative reconstruction technique. CONTRAST:  OMNIPAQUE IOHEXOL 300 MG/ML  SOLN COMPARISON:   08/17/2020 FINDINGS: Lower chest: Partial atelectasis of the right middle lobe. Basilar emphysema. There is fluid within the distal esophagus. Hepatobiliary: Tiny hypodensity at the hepatic dome, too small to characterize. No suspicious liver lesion. Gallbladder physiologically distended, no calcified stone. No biliary dilatation. Pancreas: Mildly atrophic.  No ductal dilatation or inflammation. Spleen: Normal in size without focal abnormality. Calcified granuloma. Few splenules are seen inferiorly. Adrenals/Urinary Tract: Normal adrenal glands. No hydronephrosis. Mild perinephric edema about the inferior left kidney. No renal calculi. Unremarkable urinary bladder. Stomach/Bowel: Wall thickening and pericolonic edema of the colon from the splenic flexure through the distal descending. Findings consistent with colitis. Enteric chain sutures are noted in the sigmoid colon. The appendix is visualized and normal. Mild fluid and air distension of small bowel in the upper abdomen, maximal dimension 3.9 cm. There is gaseous gastric distension. Small duodenal diverticulum. Fluid within the distal esophagus. No definite gastric or small bowel inflammation. Vascular/Lymphatic: Moderate aortic atherosclerosis. No aneurysm. No evidence of mesenteric and bowel is disease. Patent mesenteric and portal vein. No abdominopelvic adenopathy. Reproductive: Prostate is unremarkable. Other: No free air. No ascites. No abdominopelvic collection. No abdominal wall hernia. Musculoskeletal: There are no acute or suspicious osseous abnormalities. IMPRESSION: 1. Colitis from the splenic flexure through the distal descending colon, likely infectious or inflammatory. 2. Gastric and scattered small bowel distension with air and fluid. Findings suggest generalized ileus. There is fluid in the distal esophagus that may be related to reflux or delayed transit. 3. Mild perinephric edema about the inferior left kidney, nonspecific. Recommend  correlation with urinalysis to exclude urinary tract infection. 4. Partial atelectasis in the right middle lobe. Aortic Atherosclerosis (ICD10-I70.0) and Emphysema (ICD10-J43.9). Electronically Signed   By: Narda Rutherford M.D.   On: 11/22/2021 00:06   DG Chest Port 1 View  Result Date: 11/21/2021 CLINICAL DATA:  Dyspnea EXAM: PORTABLE CHEST 1 VIEW COMPARISON:  10/29/2020 FINDINGS: Cardiac shadow is stable. Tracheostomy tube is noted in satisfactory position. Lungs are well aerated bilaterally. Very mild interstitial markings are noted without focal confluent infiltrate. No edema is seen. IMPRESSION: Mild interstitial markings greater than that seen on the prior exam. No focal infiltrate is seen. Electronically Signed   By: Alcide Clever M.D.   On: 11/21/2021 19:31    Assessment  56 y.o. male with a history of COPD, chronic respiratory failure on 3 L of oxygen, coronary artery disease, myocardial infarction, tracheostomy dependent, depression, anxiety, COPD, BPH, admitted to the hospital with shortness of breath and found to have colitis and ileus on CT.   Ileus: felt to be related to chronic opiate use. NG tube has been in place with initially 600 ml recorded when first placed on 7/8, then 800 ml 7/9 midday and an additional 200 ml emptied yesterday evening. Nothing recorded from overnight; I have reached out to nursing staff. Patient remains distended but passing flatus and has had BM. He would like to try sips of clears. We can attempt this.   Colitis: with associated episode of bloody stool while in ED. Unable to rule out ischemic etiology. No stool studies on file as having no diarrhea. Recommend outpatient colonoscopy. Continue with empiric antibiotics.  Plan / Recommendations  Attempt sips of clears and clamp NG tube Continue empiric antibiotics Outpatient colonoscopy Strict I/Os.     LOS: 3 days    11/25/2021, 9:45 AM  Gelene Mink, PhD, [facility] Gastroenterology

## 2021-11-25 NOTE — Progress Notes (Signed)
PROGRESS NOTE    Charles Weeks  UDJ:[id number] DOB: 24-Oct-1965 DOA: 11/21/2021 PCP: Anabel Halon, MD    Brief Narrative:  57 year old male with a history of COPD, chronic respiratory failure on 3 L of oxygen, tracheostomy dependent, depression, anxiety, admitted to the hospital with shortness of breath.  Although he did not have any significant pneumonia on chest imaging, he was noted to have possible colitis and ileus on CT abdomen.  He was admitted for further evaluation and started on antibiotics.  Seen by GI.  Started to have vomiting and had NG tube placed for decompression.   Assessment & Plan:   Principal Problem:   SIRS (systemic inflammatory response syndrome) (HCC) Active Problems:   COPD (chronic obstructive pulmonary disease) (HCC)   Depression with anxiety   Colitis   AKI (acute kidney injury) (HCC)   Chronic pain   GERD (gastroesophageal reflux disease)   HLD (hyperlipidemia)   GI bleed   Vomiting   Ileus (HCC)   Sepsis, present on admission, ruled in -On admission: Meets sepsis criteria with a white blood cell count of 18.3, heart rate 110, respiratory rate 25, AKI, and source being colitis -Started on Cipro/Flagyl.  Cipro discontinued since he developed hives and patient started on ceftriaxone -Lactic acid normal -Hydrated with IV fluids -Blood cultures have not shown any growth  Colitis -Unclear etiology, possibly infectious -On antibiotics -Stool studies been ordered  Ileus -Continues to have abdominal distention and no significant bowel movements -NG tube in place for decompression -Still having significant output from NG tube -GI following -May need colonoscopy versus rivastigmine if he does not improve -Patient is ambulating around the unit -Limit opiates as much as possible -We will provide Gastrografin through NG tube for SBO protocol.  Follow-up abdominal x-ray.  Discussed with Dr. Marletta Lor.  Acute kidney injury -Related to  dehydration -Improved with IV fluids  COPD -Initially had elevated PCO2 on admission -This has improved back to normal range -Continue bronchodilators, Singulair -No wheezing at this time  Chronic respiratory failure with hypoxia -He is trach dependent and chronically on 3 L of oxygen  Heme positive stools -Possibly related to colitis -Hemoglobin currently stable -GI following  Chronic pain syndrome -Continue pain medications as needed -Holding oxycodone for now  Depression, anxiety -Continue on BuSpar, Klonopin and Seroquel -Wellbutrin XL currently on hold since it cannot be put down NG tube.  Urinary retention -Per staff/family, patient required straight cath to be done at home every 6 hours -Reviewed and it showed that he is on bethanechol, will continue -Continue In-N-Out cath as needed  Hypokalemia -Replace  DVT prophylaxis: SCDs Start: 11/22/21 0139  Code Status: Full code Family Communication: Updated sister at the bedside Disposition Plan: Status is: Inpatient Remains inpatient appropriate because: Continued management of ileus, currently has NG tube for decompression, await return of bowel function     Consultants:  GI  Procedures:    Antimicrobials:  Cipro 7/7 > 7/8 Ceftriaxone 7/8 > Flagyl 7/7>    Subjective: He was small bowel movement earlier this morning.  Does report that he is passing gas.  Abdomen remains distended.  Objective: Vitals:   11/25/21 0007 11/25/21 0542 11/25/21 0731 11/25/21 1223  BP:  (!) 145/82  (!) 148/90  Pulse: (!) 101 90  89  Resp: 16 18  20   Temp: 99.3 F (37.4 C) 98.3 F (36.8 C)  98.1 F (36.7 C)  TempSrc: Oral Oral  Oral  SpO2: 98% 99% 98% 96%  Weight:  Height:        Intake/Output Summary (Last 24 hours) at 11/25/2021 1816 Last data filed at 11/25/2021 1300 Gross per 24 hour  Intake 1114.68 ml  Output 200 ml  Net 914.68 ml   Filed Weights   11/22/21 0505 11/23/21 0500 11/24/21 2043  Weight:  88.1 kg 86.8 kg 85.8 kg    Examination:  General exam: Appears calm and comfortable  Respiratory system: Clear to auscultation. Respiratory effort normal. Cardiovascular system: S1 & S2 heard, RRR. No JVD, murmurs, rubs, gallops or clicks. No pedal edema. Gastrointestinal system: Abdomen is distended, soft and nontender. No organomegaly or masses felt.  Bowel sounds a little better today. NG tube remains in place Central nervous system: Alert and oriented. No focal neurological deficits. Extremities: Symmetric 5 x 5 power. Skin: No rashes, lesions or ulcers Psychiatry: Judgement and insight appear normal. Mood & affect appropriate.     Data Reviewed: I have personally reviewed following labs and imaging studies  CBC: Recent Labs  Lab 11/21/21 1908 11/22/21 0357 11/23/21 0341 11/24/21 0354 11/25/21 0413  WBC 18.3* 16.3* 13.6* 11.6* 10.3  NEUTROABS 14.6* 14.4*  --   --   --   HGB 15.6 14.1 13.2 12.3* 11.5*  HCT 48.3 42.7 40.4 37.7* 35.1*  MCV 92.2 91.2 91.2 91.3 91.2  PLT 236 213 199 220 208   Basic Metabolic Panel: Recent Labs  Lab 11/21/21 1908 11/22/21 0357 11/23/21 0341 11/24/21 0354 11/25/21 0413  NA 141 141 140 142 141  K 4.2 3.9 3.2* 3.3* 3.4*  CL 101 106 104 106 105  CO2 28 27 28 29 29   GLUCOSE 252* 135* 123* 103* 74  BUN 15 15 7 7 8   CREATININE 1.41* 1.07 0.76 0.78 0.80  CALCIUM 9.4 8.7* 8.1* 8.2* 8.3*  MG  --  2.1  --  1.9  --   PHOS  --   --   --  2.7  --    GFR: Estimated Creatinine Clearance: 110 mL/min (by C-G formula based on SCr of 0.8 mg/dL). Liver Function Tests: Recent Labs  Lab 11/21/21 1908 11/22/21 0357 11/24/21 0354  AST 25 20  --   ALT 27 21  --   ALKPHOS 143* 116  --   BILITOT 0.6 0.5  --   PROT 8.0 6.8  --   ALBUMIN 4.6 3.8 3.0*   No results for input(s): "LIPASE", "AMYLASE" in the last 168 hours. No results for input(s): "AMMONIA" in the last 168 hours. Coagulation Profile: No results for input(s): "INR", "PROTIME" in the  last 168 hours. Cardiac Enzymes: No results for input(s): "CKTOTAL", "CKMB", "CKMBINDEX", "TROPONINI" in the last 168 hours. BNP (last 3 results) No results for input(s): "PROBNP" in the last 8760 hours. HbA1C: No results for input(s): "HGBA1C" in the last 72 hours.  CBG: Recent Labs  Lab 11/24/21 1624 11/24/21 1925 11/24/21 2048 11/25/21 0714 11/25/21 1108  GLUCAP 84 82 81 76 75   Lipid Profile: No results for input(s): "CHOL", "HDL", "LDLCALC", "TRIG", "CHOLHDL", "LDLDIRECT" in the last 72 hours. Thyroid Function Tests: No results for input(s): "TSH", "T4TOTAL", "FREET4", "T3FREE", "THYROIDAB" in the last 72 hours. Anemia Panel: No results for input(s): "VITAMINB12", "FOLATE", "FERRITIN", "TIBC", "IRON", "RETICCTPCT" in the last 72 hours. Sepsis Labs: Recent Labs  Lab 11/22/21 0357  LATICACIDVEN 1.9    Recent Results (from the past 240 hour(s))  Culture, blood (Routine X 2) w Reflex to ID Panel     Status: None (Preliminary result)  Collection Time: 11/22/21  6:40 AM   Specimen: BLOOD LEFT HAND  Result Value Ref Range Status   Specimen Description BLOOD LEFT HAND  Final   Special Requests   Final    BOTTLES DRAWN AEROBIC AND ANAEROBIC Blood Culture adequate volume   Culture   Final    NO GROWTH 3 DAYS Performed at [facility], [address], [place], [place] [address]    Report Status PENDING  Incomplete  Culture, blood (Routine X 2) w Reflex to ID Panel     Status: None (Preliminary result)   Collection Time: 11/22/21  6:40 AM   Specimen: Left Antecubital; Blood  Result Value Ref Range Status   Specimen Description LEFT ANTECUBITAL  Final   Special Requests   Final    BOTTLES DRAWN AEROBIC AND ANAEROBIC Blood Culture adequate volume   Culture   Final    NO GROWTH 3 DAYS Performed at [facility], [address] [place], [place] [address]    Report Status PENDING  Incomplete  MRSA Next Gen by PCR, Nasal     Status: None   Collection Time: 11/22/21  3:36 PM    Specimen: Nasal Mucosa; Nasal Swab  Result Value Ref Range Status   MRSA by PCR Next Gen NOT DETECTED NOT DETECTED Final    Comment: (NOTE) The GeneXpert MRSA Assay (FDA approved for NASAL specimens only), is one component of a comprehensive MRSA colonization surveillance program. It is not intended to diagnose MRSA infection nor to guide or monitor treatment for MRSA infections. Test performance is not FDA approved in patients less than 49 years old. Performed at [facility], [address] [place], [place] [address]          Radiology Studies: DG Abd 1 View  Result Date: 11/24/2021 CLINICAL DATA:  Abdominal distension. EXAM: ABDOMEN - 1 VIEW COMPARISON:  11/23/2021, CT 11/21/2021. FINDINGS: There is no bowel dilation to suggest obstruction. Nasal/orogastric tube extends well into the stomach, unchanged. No convincing renal or ureteral stones. Soft tissues are unremarkable. IMPRESSION: 1. No acute findings.  No evidence of bowel obstruction. Electronically Signed   By: Amie Portland M.D.   On: 11/24/2021 08:47        Scheduled Meds:  arformoterol  15 mcg Nebulization BID   bethanechol  50 mg Per Tube QID   busPIRone  10 mg Per Tube TID   Chlorhexidine Gluconate Cloth  6 each Topical Daily   clonazePAM  0.5 mg Per Tube QHS   diatrizoate meglumine-sodium       insulin aspart  0-15 Units Subcutaneous TID WC   insulin aspart  0-5 Units Subcutaneous QHS   montelukast  10 mg Per Tube QHS   mouth rinse  15 mL Mouth Rinse 4 times per day   pantoprazole (PROTONIX) IV  40 mg Intravenous Q24H   QUEtiapine  150 mg Per Tube BID   QUEtiapine  300 mg Per Tube QHS   terazosin  5 mg Per Tube Daily   Continuous Infusions:  sodium chloride 75 mL/hr at 11/24/21 1651   cefTRIAXone (ROCEPHIN)  IV 2 g (11/24/21 1753)   metronidazole 500 mg (11/25/21 1212)     LOS: 3 days    Time spent:    Erick Blinks, MD [facility]   If 7PM-7AM, please contact  night-coverage [email or web address]  11/25/2021, 6:16 PM

## 2021-11-26 ENCOUNTER — Inpatient Hospital Stay (HOSPITAL_COMMUNITY): Payer: Medicaid Other

## 2021-11-26 DIAGNOSIS — N179 Acute kidney failure, unspecified: Secondary | ICD-10-CM | POA: Diagnosis not present

## 2021-11-26 DIAGNOSIS — G894 Chronic pain syndrome: Secondary | ICD-10-CM | POA: Diagnosis not present

## 2021-11-26 DIAGNOSIS — D649 Anemia, unspecified: Secondary | ICD-10-CM | POA: Diagnosis not present

## 2021-11-26 DIAGNOSIS — K567 Ileus, unspecified: Secondary | ICD-10-CM

## 2021-11-26 DIAGNOSIS — R6510 Systemic inflammatory response syndrome (SIRS) of non-infectious origin without acute organ dysfunction: Secondary | ICD-10-CM | POA: Diagnosis not present

## 2021-11-26 DIAGNOSIS — K529 Noninfective gastroenteritis and colitis, unspecified: Secondary | ICD-10-CM | POA: Diagnosis not present

## 2021-11-26 LAB — CBC
HCT: 34.2 % — ABNORMAL LOW (ref 39.0–52.0)
Hemoglobin: 11.3 g/dL — ABNORMAL LOW (ref 13.0–17.0)
MCH: 30.5 pg (ref 26.0–34.0)
MCHC: 33 g/dL (ref 30.0–36.0)
MCV: 92.2 fL (ref 80.0–100.0)
Platelets: 196 10*3/uL (ref 150–400)
RBC: 3.71 MIL/uL — ABNORMAL LOW (ref 4.22–5.81)
RDW: 12.9 % (ref 11.5–15.5)
WBC: 9.2 10*3/uL (ref 4.0–10.5)
nRBC: 0 % (ref 0.0–0.2)

## 2021-11-26 LAB — BASIC METABOLIC PANEL
Anion gap: 10 (ref 5–15)
BUN: 8 mg/dL (ref 6–20)
CO2: 22 mmol/L (ref 22–32)
Calcium: 8.3 mg/dL — ABNORMAL LOW (ref 8.9–10.3)
Chloride: 106 mmol/L (ref 98–111)
Creatinine, Ser: 0.75 mg/dL (ref 0.61–1.24)
GFR, Estimated: >60 mL/min (ref >60)
Glucose, Bld: 100 mg/dL — ABNORMAL HIGH (ref 70–99)
Potassium: 3.4 mmol/L — ABNORMAL LOW (ref 3.5–5.1)
Sodium: 138 mmol/L (ref 135–145)

## 2021-11-26 LAB — GLUCOSE, CAPILLARY
Glucose-Capillary: 74 mg/dL (ref 70–99)
Glucose-Capillary: 100 mg/dL — ABNORMAL HIGH (ref 70–99)
Glucose-Capillary: 213 mg/dL — ABNORMAL HIGH (ref 70–99)
Glucose-Capillary: 160 mg/dL — ABNORMAL HIGH (ref 70–99)

## 2021-11-26 MED ORDER — BUPROPION HCL ER (XL) 300 MG PO TB24
300.0000 mg | ORAL_TABLET | Freq: Every day | ORAL | Status: DC
  Administered 2021-11-27 – 2021-11-28 (×2): 300 mg via ORAL
  Filled 2021-11-26 (×2): qty 1

## 2021-11-26 MED ORDER — POLYETHYLENE GLYCOL 3350 17 G PO PACK
17.0000 g | PACK | Freq: Two times a day (BID) | ORAL | Status: DC
  Administered 2021-11-26 – 2021-11-27 (×2): 17 g via ORAL
  Filled 2021-11-26 (×3): qty 1

## 2021-11-26 MED ORDER — POTASSIUM CHLORIDE 10 MEQ/100ML IV SOLN
10.0000 meq | INTRAVENOUS | Status: DC
  Administered 2021-11-26: 10 meq via INTRAVENOUS
  Filled 2021-11-26: qty 100

## 2021-11-26 NOTE — Progress Notes (Signed)
PROGRESS NOTE    Charles Weeks  HEN:[id number] DOB: 21-Aug-1965 DOA: 11/21/2021 PCP: Anabel Halon, MD    Brief Narrative:  56 year old male with a history of COPD, chronic respiratory failure on 3 L of oxygen, tracheostomy dependent, depression, anxiety, admitted to the hospital with shortness of breath.  Although he did not have any significant pneumonia on chest imaging, he was noted to have possible colitis and ileus on CT abdomen.  He was admitted for further evaluation and started on antibiotics.  Seen by GI.  Started to have vomiting and had NG tube placed for decompression.  He was continued on bowel rest and IV fluids.  Received Gastrografin for SBO protocol.  He started to have bowel movements and is passing gas.  Advancing diet and hopefully discontinue NG tube later today.   Assessment & Plan:   Principal Problem:   SIRS (systemic inflammatory response syndrome) (HCC) Active Problems:   COPD (chronic obstructive pulmonary disease) (HCC)   Depression with anxiety   Colitis   AKI (acute kidney injury) (HCC)   Chronic pain   GERD (gastroesophageal reflux disease)   HLD (hyperlipidemia)   GI bleed   Vomiting   Ileus (HCC)   Anemia   Sepsis, present on admission, ruled in -On admission: Meets sepsis criteria with a white blood cell count of 18.3, heart rate 110, respiratory rate 25, AKI, and source being colitis -Started on Cipro/Flagyl.  Cipro discontinued since he developed hives and patient started on ceftriaxone -Lactic acid normal -Hydrated with IV fluids -Blood cultures have not shown any growth  Colitis -Unclear etiology, possibly infectious -On antibiotics -Stool studies been ordered  Ileus -Initially having abdominal distention and no significant bowel movements -NG tube in place for decompression -GI following -Received Gastrografin per SBO protocol.  It was noted that contrast had passed into his right colon. -He has since started to have bowel  movements and is passing gas -Start on clear liquids which he appears to be tolerating -Discontinue NG tube today and start on soft diet for supper  Acute kidney injury -Related to dehydration -Improved with IV fluids  COPD -Initially had elevated PCO2 on admission -This has improved back to normal range -Continue bronchodilators, Singulair -No wheezing at this time  Chronic respiratory failure with hypoxia -He is trach dependent and chronically on 3 L of oxygen -Respiratory status appears to be at baseline  Heme positive stools -Possibly related to colitis -Hemoglobin currently stable -GI following  Chronic pain syndrome -Continue pain medications as needed -Holding OxyContin for now  Depression, anxiety -Continue on BuSpar, Klonopin and Seroquel -Continue Wellbutrin XL  Urinary retention -Per staff/family, patient required straight cath to be done at home every 6 hours -Reviewed and it showed that he is on bethanechol, will continue -Continue In-N-Out cath as needed  Hypokalemia -Replace  DVT prophylaxis: SCDs Start: 11/22/21 0139  Code Status: Full code Family Communication: Updated sister at the bedside Disposition Plan: Status is: Inpatient Remains inpatient appropriate because: Continued management of ileus, currently has NG tube for decompression, await return of bowel function     Consultants:  GI  Procedures:    Antimicrobials:  Cipro 7/7 > 7/8 Ceftriaxone 7/8 > Flagyl 7/7>    Subjective: He is started to have some bowel movements and passing gas.  Tolerated clear liquids for lunch earlier today did not have any nausea or vomiting  Objective: Vitals:   11/26/21 0510 11/26/21 0605 11/26/21 0801 11/26/21 1320  BP:  (!) 152/93  Marland Kitchen)  159/97  Pulse: 91 89  99  Resp: 19 16  16   Temp:  99.2 F (37.3 C)  98.2 F (36.8 C)  TempSrc:  Oral  Oral  SpO2: 92% 95% 94% 97%  Weight:      Height:        Intake/Output Summary (Last 24 hours) at  11/26/2021 1715 Last data filed at 11/26/2021 1500 Gross per 24 hour  Intake 1126.16 ml  Output 1 ml  Net 1125.16 ml   Filed Weights   11/22/21 0505 11/23/21 0500 11/24/21 2043  Weight: 88.1 kg 86.8 kg 85.8 kg    Examination:  General exam: Appears calm and comfortable  Respiratory system: scattered rhonchi. Respiratory effort normal. Trach in place Cardiovascular system: S1 & S2 heard, RRR. No JVD, murmurs, rubs, gallops or clicks. No pedal edema. Gastrointestinal system: Abdomen is less distended, soft and nontender. No organomegaly or masses felt.  Bowel sounds a little better today. NG tube remains in place Central nervous system: Alert and oriented. No focal neurological deficits. Extremities: Symmetric 5 x 5 power. Skin: No rashes, lesions or ulcers Psychiatry: Judgement and insight appear normal. Mood & affect appropriate.     Data Reviewed: I have personally reviewed following labs and imaging studies  CBC: Recent Labs  Lab 11/21/21 1908 11/22/21 0357 11/23/21 0341 11/24/21 0354 11/25/21 0413 11/26/21 1311  WBC 18.3* 16.3* 13.6* 11.6* 10.3 9.2  NEUTROABS 14.6* 14.4*  --   --   --   --   HGB 15.6 14.1 13.2 12.3* 11.5* 11.3*  HCT 48.3 42.7 40.4 37.7* 35.1* 34.2*  MCV 92.2 91.2 91.2 91.3 91.2 92.2  PLT 236 213 199 220 208 196   Basic Metabolic Panel: Recent Labs  Lab 11/22/21 0357 11/23/21 0341 11/24/21 0354 11/25/21 0413 11/26/21 0441  NA 141 140 142 141 138  K 3.9 3.2* 3.3* 3.4* 3.4*  CL 106 104 106 105 106  CO2 27 28 29 29 22   GLUCOSE 135* 123* 103* 74 100*  BUN 15 7 7 8 8   CREATININE 1.07 0.76 0.78 0.80 0.75  CALCIUM 8.7* 8.1* 8.2* 8.3* 8.3*  MG 2.1  --  1.9  --   --   PHOS  --   --  2.7  --   --    GFR: Estimated Creatinine Clearance: 110 mL/min (by C-G formula based on SCr of 0.75 mg/dL). Liver Function Tests: Recent Labs  Lab 11/21/21 1908 11/22/21 0357 11/24/21 0354  AST 25 20  --   ALT 27 21  --   ALKPHOS 143* 116  --   BILITOT 0.6  0.5  --   PROT 8.0 6.8  --   ALBUMIN 4.6 3.8 3.0*   No results for input(s): "LIPASE", "AMYLASE" in the last 168 hours. No results for input(s): "AMMONIA" in the last 168 hours. Coagulation Profile: No results for input(s): "INR", "PROTIME" in the last 168 hours. Cardiac Enzymes: No results for input(s): "CKTOTAL", "CKMB", "CKMBINDEX", "TROPONINI" in the last 168 hours. BNP (last 3 results) No results for input(s): "PROBNP" in the last 8760 hours. HbA1C: No results for input(s): "HGBA1C" in the last 72 hours.  CBG: Recent Labs  Lab 11/25/21 1108 11/25/21 2151 11/26/21 0731 11/26/21 1119 11/26/21 1610  GLUCAP 75 75 74 100* 213*   Lipid Profile: No results for input(s): "CHOL", "HDL", "LDLCALC", "TRIG", "CHOLHDL", "LDLDIRECT" in the last 72 hours. Thyroid Function Tests: No results for input(s): "TSH", "T4TOTAL", "FREET4", "T3FREE", "THYROIDAB" in the last 72 hours. Anemia Panel:  No results for input(s): "VITAMINB12", "FOLATE", "FERRITIN", "TIBC", "IRON", "RETICCTPCT" in the last 72 hours. Sepsis Labs: Recent Labs  Lab 11/22/21 0357  LATICACIDVEN 1.9    Recent Results (from the past 240 hour(s))  Culture, blood (Routine X 2) w Reflex to ID Panel     Status: None (Preliminary result)   Collection Time: 11/22/21  6:40 AM   Specimen: BLOOD LEFT HAND  Result Value Ref Range Status   Specimen Description BLOOD LEFT HAND  Final   Special Requests   Final    BOTTLES DRAWN AEROBIC AND ANAEROBIC Blood Culture adequate volume   Culture   Final    NO GROWTH 4 DAYS Performed at [facility], [address], [place], [place] [address]    Report Status PENDING  Incomplete  Culture, blood (Routine X 2) w Reflex to ID Panel     Status: None (Preliminary result)   Collection Time: 11/22/21  6:40 AM   Specimen: Left Antecubital; Blood  Result Value Ref Range Status   Specimen Description LEFT ANTECUBITAL  Final   Special Requests   Final    BOTTLES DRAWN AEROBIC AND ANAEROBIC  Blood Culture adequate volume   Culture   Final    NO GROWTH 4 DAYS Performed at [facility], [address] [place], [place] [address]    Report Status PENDING  Incomplete  MRSA Next Gen by PCR, Nasal     Status: None   Collection Time: 11/22/21  3:36 PM   Specimen: Nasal Mucosa; Nasal Swab  Result Value Ref Range Status   MRSA by PCR Next Gen NOT DETECTED NOT DETECTED Final    Comment: (NOTE) The GeneXpert MRSA Assay (FDA approved for NASAL specimens only), is one component of a comprehensive MRSA colonization surveillance program. It is not intended to diagnose MRSA infection nor to guide or monitor treatment for MRSA infections. Test performance is not FDA approved in patients less than 47 years old. Performed at [facility], [address], [place], [place] [address]          Radiology Studies: DG Abd Portable 1V-Small Bowel Obstruction Protocol-initial, 8 hr delay  Result Date: 11/26/2021 CLINICAL DATA:  Possible colitis an ileus, 8 hour delayed abdominal films EXAM: PORTABLE ABDOMEN - 1 VIEW COMPARISON:  11/24/2021 FINDINGS: Enteric tube terminates in the proximal gastric body. Nonobstructive bowel gas pattern. Contrast opacifies the right colon at the time of imaging. IMPRESSION: Enteric tube terminates in the proximal gastric body. Moderate bowel gas pattern. Contrast opacifies the right colon at the time of imaging. Electronically Signed   By: Charline Bills M.D.   On: 11/26/2021 02:49        Scheduled Meds:  arformoterol  15 mcg Nebulization BID   bethanechol  50 mg Per Tube QID   busPIRone  10 mg Per Tube TID   Chlorhexidine Gluconate Cloth  6 each Topical Daily   clonazePAM  0.5 mg Per Tube QHS   insulin aspart  0-15 Units Subcutaneous TID WC   insulin aspart  0-5 Units Subcutaneous QHS   montelukast  10 mg Per Tube QHS   mouth rinse  15 mL Mouth Rinse 4 times per day   pantoprazole (PROTONIX) IV  40 mg Intravenous Q24H   polyethylene glycol  17 g Oral  BID   QUEtiapine  150 mg Per Tube BID   QUEtiapine  300 mg Per Tube QHS   terazosin  5 mg Per Tube Daily   Continuous Infusions:  sodium chloride  75 mL/hr at 11/25/21 2335   cefTRIAXone (ROCEPHIN)  IV 2 g (11/25/21 1825)   metronidazole 500 mg (11/26/21 1248)     LOS: 4 days    Time spent:    Erick Blinks, MD [facility]   If 7PM-7AM, please contact night-coverage [email or web address]  11/26/2021, 5:15 PM

## 2021-11-26 NOTE — Progress Notes (Signed)
Patient has had 3 BM's today , he is also passing gas. Tolerates clear liquid without difficulty

## 2021-11-26 NOTE — Progress Notes (Signed)
Subjective: Continue with abdominal pain all over. Not much change. He is having bowel movements. 3 documented BM yeterday. Per patient, stools were runny and small in volume. Had a BM this morning. Still smaller in volume and runny. Passed a lot of gas this morning and has some relief of his abdominal discomfort. No known brbpr or melena. No vomiting. No nausea. Tolerated water and icy yeterday.   Walked down to the nurses station yesterday and back, but got dizzy with SOB. Overall, breathing is improving.   Objective: Vital signs in last 24 hours: Temp:  [98.1 F (36.7 C)-99.3 F (37.4 C)] 99.2 F (37.3 C) (07/11 0605) Pulse Rate:  [89-96] 89 (07/11 0605) Resp:  [16-22] 16 (07/11 0605) BP: (148-163)/(90-93) 152/93 (07/11 0605) SpO2:  [92 %-96 %] 94 % (07/11 0801) FiO2 (%):  [28 %] 28 % (07/11 0801) Last BM Date : 11/25/21 General:   Alert and oriented, pleasant, NAD, with tracheostomy, NG tube in place, but clamped and suction turned off.  Head:  Normocephalic and atraumatic. Abdomen:  Distended but non-tense, +bowel sounds, mild TTP in epigastric area, LUQ, and moderate TTP in left mid abdomen. No rebound or guarding. No masses appreciated  Msk:  Symmetrical without gross deformities. Normal posture. Extremities:  Without edema. Neurologic:  Alert and  oriented x4;  grossly normal neurologically. Skin:  Warm and dry, intact without significant lesions.  Psych:  Normal mood and affect.  Intake/Output from previous day: 07/10 0701 - 07/11 0700 In: 1006.2 [P.O.:300; I.V.:406.2; IV Piggyback:300] Out: -  Intake/Output this shift: No intake/output data recorded.  Lab Results: Recent Labs    11/24/21 0354 11/25/21 0413  WBC 11.6* 10.3  HGB 12.3* 11.5*  HCT 37.7* 35.1*  PLT 220 208   BMET Recent Labs    11/24/21 0354 11/25/21 0413 11/26/21 0441  NA 142 141 138  K 3.3* 3.4* 3.4*  CL 106 105 106  CO2 29 29 22   GLUCOSE 103* 74 100*  BUN 7 8 8   CREATININE 0.78  0.80 0.75  CALCIUM 8.2* 8.3* 8.3*   LFT Recent Labs    11/24/21 0354  ALBUMIN 3.0*    Studies/Results: DG Abd Portable 1V-Small Bowel Obstruction Protocol-initial, 8 hr delay  Result Date: 11/26/2021 CLINICAL DATA:  Possible colitis an ileus, 8 hour delayed abdominal films EXAM: PORTABLE ABDOMEN - 1 VIEW COMPARISON:  11/24/2021 FINDINGS: Enteric tube terminates in the proximal gastric body. Nonobstructive bowel gas pattern. Contrast opacifies the right colon at the time of imaging. IMPRESSION: Enteric tube terminates in the proximal gastric body. Moderate bowel gas pattern. Contrast opacifies the right colon at the time of imaging. Electronically Signed   By: 01/27/2022 M.D.   On: 11/26/2021 02:49    Assessment: 56 y.o. male with a history of COPD, chronic respiratory failure on 3 L of oxygen, coronary artery disease, myocardial infarction, tracheostomy dependent, depression, anxiety, COPD, BPH, admitted to the hospital on 7/6 with shortness of breath. CT A/P with contrast with wall thickening and pericolonic edema of the colon from splenic flexure through the distal descending colon consistent with colitis, gastric and scattered small bowel distention suggesting ileus.  He was started on IV antibiotics and NG tube was placed to decompress his upper GI tract.  Ileus: CT with gastric and scattered small bowel distension with air and fluid suggesting generalized ileus. This was suspected to be related to chronic opiate use. NG tube placed on 7/8, clamped on 7/10 as output had decreased and  patient was requesting sips of clears which he tolerated well. NG tube remains clamped today. Denies nausea or vomiting. Continues with abdominal distension and generalized abdominal discomfort without significant change. Received suppository yesterday and had 3 smaller liquid BMs, 1 liquid BM this morning along with passing a lot of flatus which patient reported provided him with some relief of abdominal  bloating. He had SBO obstruction protocol x-ray this morning that showed nonobstructive bowel gas pattern with contrast opacifying the right colon at the time of imaging. He is asking to advance his diet. We can try clear liquids today. Discussed with Dr. Levon Hedger. We will also start MiraLAX BID.   Colitis:  Wall thickening and pericolonic edema of the colon from splenic flexure through the distal descending colon, consistent with colitis on CT this admission. Unclear etiology. Elevated white count of 18.3 on admission suggestive of infectious etiology.  He has been on empiric antibiotics since admission and WBC count has normalized. He did have an episode of bloody stool while in ED raising question of ischemic colitis, but not recurrent bleeding. Abdominal discomfort is about the same though likely influenced by ileus as well which seems to be resolving. Initially, no reports of diarrhea, actually reporting constipation prior to admission, thus no stool studies have been ordered. However, he is now having small watery stools though this may be secondary to resolving ileus and receiving suppository yesterday. Will hold off on stool studies for now. He will need outpatient colonoscopy.   Anemia:  Heme positive on 7/7. Single episode of bloody stool in the ED likely secondary to colitis. No further overt GI bleeding. Hgb 15.6 on admission and has been slowly trending down, 11.5 yesterday.  This may be influenced by IV hydration.  We will repeat CBC today and continue to monitor for overt GI bleeding.  He needs a colonoscopy outpatient to follow-up on colitis.  Plan: CBC Monitor for overt GI bleeding.  Continue IV antibiotics.  Trial clear liquid diet.  Start MiraLAX 17g BID.  Outpatient colonoscopy.    LOS: 4 days    11/26/2021, 8:33 AM   Ermalinda Memos, PA-C [facility]

## 2021-11-27 DIAGNOSIS — R6510 Systemic inflammatory response syndrome (SIRS) of non-infectious origin without acute organ dysfunction: Secondary | ICD-10-CM | POA: Diagnosis not present

## 2021-11-27 LAB — CBC
HCT: 37.1 % — ABNORMAL LOW (ref 39.0–52.0)
Hemoglobin: 12.5 g/dL — ABNORMAL LOW (ref 13.0–17.0)
MCH: 30 pg (ref 26.0–34.0)
MCHC: 33.7 g/dL (ref 30.0–36.0)
MCV: 89 fL (ref 80.0–100.0)
Platelets: 238 10*3/uL (ref 150–400)
RBC: 4.17 MIL/uL — ABNORMAL LOW (ref 4.22–5.81)
RDW: 13.1 % (ref 11.5–15.5)
WBC: 7.7 10*3/uL (ref 4.0–10.5)
nRBC: 0 % (ref 0.0–0.2)

## 2021-11-27 LAB — CULTURE, BLOOD (ROUTINE X 2)
Culture: NO GROWTH
Culture: NO GROWTH
Special Requests: ADEQUATE
Special Requests: ADEQUATE

## 2021-11-27 LAB — BASIC METABOLIC PANEL
Anion gap: 9 (ref 5–15)
BUN: <5 mg/dL — ABNORMAL LOW (ref 6–20)
CO2: 25 mmol/L (ref 22–32)
Calcium: 8.3 mg/dL — ABNORMAL LOW (ref 8.9–10.3)
Chloride: 106 mmol/L (ref 98–111)
Creatinine, Ser: 0.75 mg/dL (ref 0.61–1.24)
GFR, Estimated: >60 mL/min (ref >60)
Glucose, Bld: 142 mg/dL — ABNORMAL HIGH (ref 70–99)
Potassium: 2.8 mmol/L — ABNORMAL LOW (ref 3.5–5.1)
Sodium: 140 mmol/L (ref 135–145)

## 2021-11-27 LAB — PHOSPHORUS: Phosphorus: 3 mg/dL (ref 2.5–4.6)

## 2021-11-27 LAB — GLUCOSE, CAPILLARY
Glucose-Capillary: 132 mg/dL — ABNORMAL HIGH (ref 70–99)
Glucose-Capillary: 146 mg/dL — ABNORMAL HIGH (ref 70–99)
Glucose-Capillary: 130 mg/dL — ABNORMAL HIGH (ref 70–99)
Glucose-Capillary: 135 mg/dL — ABNORMAL HIGH (ref 70–99)

## 2021-11-27 LAB — MAGNESIUM: Magnesium: 1.9 mg/dL (ref 1.7–2.4)

## 2021-11-27 MED ORDER — SENNOSIDES-DOCUSATE SODIUM 8.6-50 MG PO TABS: 1.0000 | ORAL_TABLET | Freq: Every evening | ORAL | Status: DC | PRN

## 2021-11-27 MED ORDER — ACETAMINOPHEN 325 MG PO TABS: 650.0000 mg | ORAL_TABLET | Freq: Four times a day (QID) | ORAL | Status: DC | PRN

## 2021-11-27 MED ORDER — TRAZODONE HCL 50 MG PO TABS: 50.0000 mg | ORAL_TABLET | Freq: Every evening | ORAL | Status: DC | PRN

## 2021-11-27 MED ORDER — POTASSIUM CHLORIDE 20 MEQ PO PACK
40.0000 meq | PACK | ORAL | Status: AC
  Administered 2021-11-27 (×3): 40 meq via ORAL
  Filled 2021-11-27 (×2): qty 2

## 2021-11-27 MED ORDER — IPRATROPIUM-ALBUTEROL 0.5-2.5 (3) MG/3ML IN SOLN: 3.0000 mL | RESPIRATORY_TRACT | Status: DC | PRN

## 2021-11-27 MED ORDER — POTASSIUM CHLORIDE 20 MEQ PO PACK
40.0000 meq | PACK | Freq: Every day | ORAL | Status: DC
  Filled 2021-11-27: qty 2

## 2021-11-27 MED ORDER — POTASSIUM CHLORIDE 10 MEQ/100ML IV SOLN: 10.0000 meq | INTRAVENOUS | Status: DC

## 2021-11-27 MED ORDER — SODIUM CHLORIDE 0.9 % IV SOLN: INTRAVENOUS | Status: AC

## 2021-11-27 MED ORDER — METOPROLOL TARTRATE 5 MG/5ML IV SOLN: 5.0000 mg | INTRAVENOUS | Status: DC | PRN

## 2021-11-27 MED ORDER — HYDRALAZINE HCL 20 MG/ML IJ SOLN: 10.0000 mg | INTRAMUSCULAR | Status: DC | PRN

## 2021-11-27 NOTE — Plan of Care (Signed)

## 2021-11-27 NOTE — Progress Notes (Signed)
Subjective: Patient states he is doing much better today. Was able to tolerate soft diet last night and this morning, nursing staff reports he ate very well. Abdomen feels much less distended than previously and he continue to pass flatus. Still with some diffuse mild abdominal pain.  BM this morning was more watery, only 1 episode. 2 episodes of watery stools yesterday. Denies nausea or vomiting. Does not think there was any blood or melena present in stools, though states he did not inspect it. He has been up ambulating some with nursing staff.   Objective: Vital signs in last 24 hours: Temp:  [98 F (36.7 C)-99.6 F (37.6 C)] 98 F (36.7 C) (07/12 0507) Pulse Rate:  [95-110] 95 (07/12 0507) Resp:  [16-20] 20 (07/12 0507) BP: (138-159)/(81-97) 142/81 (07/12 0507) SpO2:  [92 %-98 %] 98 % (07/12 0830) FiO2 (%):  [28 %] 28 % (07/12 0830) Last BM Date : 11/27/21 General:   Alert and oriented, pleasant Head:  Normocephalic and atraumatic. Eyes:  No icterus, sclera clear. Conjuctiva pink.  Mouth:  Without lesions, mucosa pink and moist.  Heart:  S1, S2 present, no murmurs noted.  Lungs: Clear to auscultation bilaterally, without wheezing, rales, or rhonchi. Trach collar in place Abdomen:  Bowel sounds present, hyperactive. Abdomen soft, mildly distended and diffusely TTP. No HSM or hernias noted. No rebound or guarding. No masses appreciated  Msk:  Symmetrical without gross deformities. Normal posture. Pulses:  Normal pulses noted. Extremities:  Without clubbing or edema. Neurologic:  Alert and  oriented x4;  grossly normal neurologically. Skin:  Warm and dry, intact without significant lesions.  Psych:  Alert and cooperative. Normal mood and affect.  Intake/Output from previous day: 07/11 0701 - 07/12 0700 In: 480 [P.O.:480] Out: 1 [Stool:1] Intake/Output this shift: No intake/output data recorded.  Lab Results: Recent Labs    11/25/21 0413 11/26/21 1311  WBC 10.3 9.2  HGB  11.5* 11.3*  HCT 35.1* 34.2*  PLT 208 196   BMET Recent Labs    11/25/21 0413 11/26/21 0441 11/27/21 0451  NA 141 138 140  K 3.4* 3.4* 2.8*  CL 105 106 106  CO2 29 22 25   GLUCOSE 74 100* 142*  BUN 8 8 <5*  CREATININE 0.80 0.75 0.75  CALCIUM 8.3* 8.3* 8.3*   Studies/Results: DG Abd Portable 1V-Small Bowel Obstruction Protocol-initial, 8 hr delay  Result Date: 11/26/2021 CLINICAL DATA:  Possible colitis an ileus, 8 hour delayed abdominal films EXAM: PORTABLE ABDOMEN - 1 VIEW COMPARISON:  11/24/2021 FINDINGS: Enteric tube terminates in the proximal gastric body. Nonobstructive bowel gas pattern. Contrast opacifies the right colon at the time of imaging. IMPRESSION: Enteric tube terminates in the proximal gastric body. Moderate bowel gas pattern. Contrast opacifies the right colon at the time of imaging. Electronically Signed   By: 01/25/2022 M.D.   On: 11/26/2021 02:49    Assessment: Charles Weeks is a 56 y.o. male with history of COPD, chronic respiratory failure on 3 L of oxygen, coronary artery disease, myocardial infarction, tracheostomy dependent, depression, anxiety, COPD, BPH, admitted to the hospital on 7/6 with shortness of breath. CT A/P with contrast with wall thickening and pericolonic edema of the colon from splenic flexure through the distal descending colon consistent with colitis, gastric and scattered small bowel distention suggesting ileus.  He was started on IV antibiotics and NG tube was placed to decompress his upper GI tract.   Ileus: CT with gastric and scattered small bowel distension with air and  fluid suggesting generalized ileus. suspected secondary to chronic opiate use. NG tube placed on 7/8, clamped on 7/10, removed yesterday and advanced to soft diet, tolerating this well. Miralax BID also started yesterday. Received suppository Monday, has continued with smaller volume, liquid stools since, 2 yesterday and 1 so far today. Feels abdomen is less  distended today, continues to pass flatus.  Denies nausea or vomiting. Continues with some abdominal distension and generalized abdominal discomfort though pain is less than previously. Recommend continued frequent ambulation.   Colitis: Wall thickening and pericolonic edema of the colon from splenic flexure through the distal descending colon, consistent with colitis on CT this admission. Unclear etiology. Elevated white count of 18.3 on admission suggestive of infectious etiology. on empiric antibiotics since admission, WBC count has normalized. One episode of bloody stool while in ED raising question of ischemic colitis, though overall clinical picture not really indicative of this. reporting constipation prior to admission, now with small volume watery stools since suppository on Monday, 2 yesterday and 1 today, low inclination to check stool studies at this time, however, if diarrhea becomes more frequent, should consider these. Recommending colonoscopy for f/u as outpatient.   Anemia: FOBT positive on 7/7. Single episode of hematochezia in ED, no further GI bleeding, hb 15.6 on admission, trending down to 11.3 yesterday, query hemodilution as hgb is back up to 12.5 this morning. No overt GI bleeding.   Plan: Trend H&H Monitor for overt GI bleeding Miralax BID Outpatient colonoscopy Soft diet, consider advancing as tolerated Frequent ambulation   LOS: 5 days    11/27/2021, 8:57 AM   Jaleea Alesi L. Jeanmarie Hubert, MSN, APRN, AGNP-C Adult-Gerontology Nurse Practitioner [facility]

## 2021-11-27 NOTE — Progress Notes (Signed)
PROGRESS NOTE    Charles Weeks  OEV:[id number] DOB: 1966/01/29 DOA: 11/21/2021 PCP: Anabel Halon, MD   Brief Narrative:  56 year old male with a history of COPD, chronic respiratory failure on 3 L of oxygen, tracheostomy dependent, depression, anxiety, admitted to the hospital with shortness of breath.  Although he did not have any significant pneumonia on chest imaging, he was noted to have possible colitis and ileus on CT abdomen.  He was admitted for further evaluation and started on antibiotics.  Seen by GI.  Started to have vomiting and had NG tube placed for decompression.  He was continued on bowel rest and IV fluids.  Received Gastrografin for SBO protocol.  He started to have bowel movements and is passing gas.  NG tube was discontinued on 7/11.  Slowly tolerating orals.  Still remains hypokalemic requiring repletion.   Assessment & Plan:  Principal Problem:   SIRS (systemic inflammatory response syndrome) (HCC) Active Problems:   COPD (chronic obstructive pulmonary disease) (HCC)   Depression with anxiety   Colitis   AKI (acute kidney injury) (HCC)   Chronic pain   GERD (gastroesophageal reflux disease)   HLD (hyperlipidemia)   GI bleed   Vomiting   Ileus (HCC)   Anemia     Sepsis, present on admission, ruled in -Sepsis physiology has improved - Antibiotics-Rocephin and Flagyl IV.  Developed rash to Cipro -IV fluids - Cultures-remain negative  Hypokalemia -Aggressive repletion magnesium levels okay, check phosphorus   Colitis -Currently on IV antibiotics.  GI team following.   Ileus -Initially required NG tube for decompression.  Received small bowel follow-through.  Now passing gas. - GI following-we will plan outpatient colonoscopy - MiraLAX twice daily - Diet-soft   Acute kidney injury -Baseline 0.7.  Initially 1.07 now this is resolved.   COPD -Bronchodilators.  Not an active exacerbation.   Chronic respiratory failure with hypoxia, 3 L Trach  dependent -Trach dependent.  On 3 L nasal cannula   Heme positive stools -Secondary to colitis.  GI following.  Plans for outpatient colonoscopy   Chronic pain syndrome -As needed pain medications.   Depression, anxiety -Continue Wellbutrin, BuSpar, Seroquel   Urinary retention -In and out cath as needed.   DVT prophylaxis: SCDs Start: 11/22/21 0139 Code Status: Full code Family Communication:    Status is: Inpatient Remains inpatient appropriate because: Maintain hospital stay for next 24 hours for aggressive potassium repletion IV and p.o.  This should also help with his ileus.    Subjective: Seen and examined at bedside, does not have any new complaints.  Tells me IV potassium burns a lot therefore he prefers oral.  Tolerating his soft diet slowly.   Examination:  General exam: Appears calm and comfortable, chronically ill Respiratory system: Mild rhonchi Cardiovascular system: S1 & S2 heard, RRR. No JVD, murmurs, rubs, gallops or clicks. No pedal edema. Gastrointestinal system: Abdomen is nondistended, soft and nontender. No organomegaly or masses felt. Normal bowel sounds heard. Central nervous system: Alert and oriented. No focal neurological deficits. Extremities: Symmetric 5 x 5 power. Skin: No rashes, lesions or ulcers Psychiatry: Judgement and insight appear normal. Mood & affect appropriate.  Trach in place   Objective: Vitals:   11/26/21 1320 11/26/21 2007 11/26/21 2048 11/27/21 0507  BP: (!) 159/97  138/87 (!) 142/81  Pulse: 99  (!) 110 95  Resp: 16  20 20   Temp: 98.2 F (36.8 C)  99.6 F (37.6 C) 98 F (36.7 C)  TempSrc: Oral  Oral  SpO2: 97% 95% 92% 98%  Weight:      Height:        Intake/Output Summary (Last 24 hours) at 11/27/2021 0723 Last data filed at 11/26/2021 1837 Gross per 24 hour  Intake 480 ml  Output 1 ml  Net 479 ml   Filed Weights   11/22/21 0505 11/23/21 0500 11/24/21 2043  Weight: 88.1 kg 86.8 kg 85.8 kg     Data  Reviewed:   CBC: Recent Labs  Lab 11/21/21 1908 11/22/21 0357 11/23/21 0341 11/24/21 0354 11/25/21 0413 11/26/21 1311  WBC 18.3* 16.3* 13.6* 11.6* 10.3 9.2  NEUTROABS 14.6* 14.4*  --   --   --   --   HGB 15.6 14.1 13.2 12.3* 11.5* 11.3*  HCT 48.3 42.7 40.4 37.7* 35.1* 34.2*  MCV 92.2 91.2 91.2 91.3 91.2 92.2  PLT 236 213 199 220 208 196   Basic Metabolic Panel: Recent Labs  Lab 11/22/21 0357 11/23/21 0341 11/24/21 0354 11/25/21 0413 11/26/21 0441 11/27/21 0451  NA 141 140 142 141 138 140  K 3.9 3.2* 3.3* 3.4* 3.4* 2.8*  CL 106 104 106 105 106 106  CO2 27 28 29 29 22 25   GLUCOSE 135* 123* 103* 74 100* 142*  BUN 15 7 7 8 8  <5*  CREATININE 1.07 0.76 0.78 0.80 0.75 0.75  CALCIUM 8.7* 8.1* 8.2* 8.3* 8.3* 8.3*  MG 2.1  --  1.9  --   --  1.9  PHOS  --   --  2.7  --   --   --    GFR: Estimated Creatinine Clearance: 110 mL/min (by C-G formula based on SCr of 0.75 mg/dL). Liver Function Tests: Recent Labs  Lab 11/21/21 1908 11/22/21 0357 11/24/21 0354  AST 25 20  --   ALT 27 21  --   ALKPHOS 143* 116  --   BILITOT 0.6 0.5  --   PROT 8.0 6.8  --   ALBUMIN 4.6 3.8 3.0*   No results for input(s): "LIPASE", "AMYLASE" in the last 168 hours. No results for input(s): "AMMONIA" in the last 168 hours. Coagulation Profile: No results for input(s): "INR", "PROTIME" in the last 168 hours. Cardiac Enzymes: No results for input(s): "CKTOTAL", "CKMB", "CKMBINDEX", "TROPONINI" in the last 168 hours. BNP (last 3 results) No results for input(s): "PROBNP" in the last 8760 hours. HbA1C: No results for input(s): "HGBA1C" in the last 72 hours. CBG: Recent Labs  Lab 11/25/21 2151 11/26/21 0731 11/26/21 1119 11/26/21 1610 11/26/21 2052  GLUCAP 75 74 100* 213* 160*   Lipid Profile: No results for input(s): "CHOL", "HDL", "LDLCALC", "TRIG", "CHOLHDL", "LDLDIRECT" in the last 72 hours. Thyroid Function Tests: No results for input(s): "TSH", "T4TOTAL", "FREET4", "T3FREE",  "THYROIDAB" in the last 72 hours. Anemia Panel: No results for input(s): "VITAMINB12", "FOLATE", "FERRITIN", "TIBC", "IRON", "RETICCTPCT" in the last 72 hours. Sepsis Labs: Recent Labs  Lab 11/22/21 0357  LATICACIDVEN 1.9    Recent Results (from the past 240 hour(s))  Culture, blood (Routine X 2) w Reflex to ID Panel     Status: None (Preliminary result)   Collection Time: 11/22/21  6:40 AM   Specimen: BLOOD LEFT HAND  Result Value Ref Range Status   Specimen Description BLOOD LEFT HAND  Final   Special Requests   Final    BOTTLES DRAWN AEROBIC AND ANAEROBIC Blood Culture adequate volume   Culture   Final    NO GROWTH 4 DAYS Performed at [facility], [address]  [place], [place] [address]    Report Status PENDING  Incomplete  Culture, blood (Routine X 2) w Reflex to ID Panel     Status: None (Preliminary result)   Collection Time: 11/22/21  6:40 AM   Specimen: Left Antecubital; Blood  Result Value Ref Range Status   Specimen Description LEFT ANTECUBITAL  Final   Special Requests   Final    BOTTLES DRAWN AEROBIC AND ANAEROBIC Blood Culture adequate volume   Culture   Final    NO GROWTH 4 DAYS Performed at [facility], [address] [place], [place] [address]    Report Status PENDING  Incomplete  MRSA Next Gen by PCR, Nasal     Status: None   Collection Time: 11/22/21  3:36 PM   Specimen: Nasal Mucosa; Nasal Swab  Result Value Ref Range Status   MRSA by PCR Next Gen NOT DETECTED NOT DETECTED Final    Comment: (NOTE) The GeneXpert MRSA Assay (FDA approved for NASAL specimens only), is one component of a comprehensive MRSA colonization surveillance program. It is not intended to diagnose MRSA infection nor to guide or monitor treatment for MRSA infections. Test performance is not FDA approved in patients less than 73 years old. Performed at [facility], [address] [place], [place] [address]          Radiology Studies: DG Abd Portable 1V-Small Bowel  Obstruction Protocol-initial, 8 hr delay  Result Date: 11/26/2021 CLINICAL DATA:  Possible colitis an ileus, 8 hour delayed abdominal films EXAM: PORTABLE ABDOMEN - 1 VIEW COMPARISON:  11/24/2021 FINDINGS: Enteric tube terminates in the proximal gastric body. Nonobstructive bowel gas pattern. Contrast opacifies the right colon at the time of imaging. IMPRESSION: Enteric tube terminates in the proximal gastric body. Moderate bowel gas pattern. Contrast opacifies the right colon at the time of imaging. Electronically Signed   By: Charline Bills M.D.   On: 11/26/2021 02:49        Scheduled Meds:  arformoterol  15 mcg Nebulization BID   bethanechol  50 mg Per Tube QID   buPROPion  300 mg Oral Daily   busPIRone  10 mg Per Tube TID   Chlorhexidine Gluconate Cloth  6 each Topical Daily   clonazePAM  0.5 mg Per Tube QHS   insulin aspart  0-15 Units Subcutaneous TID WC   insulin aspart  0-5 Units Subcutaneous QHS   montelukast  10 mg Per Tube QHS   mouth rinse  15 mL Mouth Rinse 4 times per day   pantoprazole (PROTONIX) IV  40 mg Intravenous Q24H   polyethylene glycol  17 g Oral BID   potassium chloride  40 mEq Oral Daily   QUEtiapine  150 mg Per Tube BID   QUEtiapine  300 mg Per Tube QHS   terazosin  5 mg Per Tube Daily   Continuous Infusions:  sodium chloride 75 mL/hr at 11/27/21 0039   cefTRIAXone (ROCEPHIN)  IV 2 g (11/26/21 1718)   metronidazole 500 mg (11/27/21 0042)   potassium chloride       LOS: 5 days   Time spent= 35 mins    Auburn Hester Joline Maxcy, MD [facility]  If 7PM-7AM, please contact night-coverage  11/27/2021, 7:23 AM

## 2021-11-28 ENCOUNTER — Telehealth: Payer: Self-pay | Admitting: Cardiology

## 2021-11-28 ENCOUNTER — Telehealth: Payer: Self-pay | Admitting: Gastroenterology

## 2021-11-28 DIAGNOSIS — R6510 Systemic inflammatory response syndrome (SIRS) of non-infectious origin without acute organ dysfunction: Secondary | ICD-10-CM | POA: Diagnosis not present

## 2021-11-28 LAB — BASIC METABOLIC PANEL
Anion gap: 4 — ABNORMAL LOW (ref 5–15)
BUN: 7 mg/dL (ref 6–20)
CO2: 28 mmol/L (ref 22–32)
Calcium: 8.2 mg/dL — ABNORMAL LOW (ref 8.9–10.3)
Chloride: 108 mmol/L (ref 98–111)
Creatinine, Ser: 0.84 mg/dL (ref 0.61–1.24)
GFR, Estimated: >60 mL/min (ref >60)
Glucose, Bld: 140 mg/dL — ABNORMAL HIGH (ref 70–99)
Potassium: 3.5 mmol/L (ref 3.5–5.1)
Sodium: 140 mmol/L (ref 135–145)

## 2021-11-28 LAB — GLUCOSE, CAPILLARY
Glucose-Capillary: 126 mg/dL — ABNORMAL HIGH (ref 70–99)
Glucose-Capillary: 129 mg/dL — ABNORMAL HIGH (ref 70–99)

## 2021-11-28 LAB — MAGNESIUM: Magnesium: 1.9 mg/dL (ref 1.7–2.4)

## 2021-11-28 LAB — PHOSPHORUS: Phosphorus: 4.2 mg/dL (ref 2.5–4.6)

## 2021-11-28 MED ORDER — POLYETHYLENE GLYCOL 3350 17 G PO PACK: 17.0000 g | PACK | Freq: Two times a day (BID) | ORAL | 0 refills | Status: DC

## 2021-11-28 MED ORDER — SENNOSIDES-DOCUSATE SODIUM 8.6-50 MG PO TABS: 1.0000 | ORAL_TABLET | Freq: Every evening | ORAL | 1 refills | Status: AC | PRN

## 2021-11-28 MED ORDER — NITROGLYCERIN 0.4 MG SL SUBL: 0.4000 mg | SUBLINGUAL_TABLET | SUBLINGUAL | 2 refills | Status: DC | PRN

## 2021-11-28 NOTE — Evaluation (Signed)
Physical Therapy Evaluation Patient Details Name: Charles Weeks MRN: [id number] DOB: [birth year] Today's Date: 11/28/2021  History of Present Illness  Pericles Carmicheal is a 56 y.o. male with medical history significant of anxiety, asthma, COPD, chronic respiratory failure with home O2 but on known how much, BPH, depression, hypertension, CAD, and more presents to ED with a chief complaint of dyspnea.  Patient does not provide much history.  He reports he is to tired cancer questions.  He does partially cooperate with the exam and then yawns and goes back to sleep.  It is reported that he felt like his trach was clogged and he could not breathe.  In the ED they suctioned him, not have any abnormal with dissection.  They did a chest x-ray that showed mild interstitial markings greater than the prior exam but no focal infiltrate.  Patient then became nauseous and had episodes of vomiting in the ER.  The caregiver reported that he had decreased p.o. intake, and subjective fever and chills at home.  For this reason CT abdomen pelvis was done that showed colitis with gastric and scattered small bowel distention and air-fluid levels that are indicative of ileus.  Patient was started on Cipro and Flagyl and admitted to hospitalist service.  During patient's cooperative portion of exam he reports that he had dyspnea at home.  He does not know when it started.  He does report that he had an increasing mucus but no bloody mucus production.  He reports that he had some chest pain but does not point to where it was.  He reports he had some lower abdominal pain bilaterally.  Not sure when that started either.  Eventually says he is too sleepy to answer questions, and closes his eyes to go back to sleep.  No further history could be obtained at this time.   Clinical Impression  Patient functioning at baseline for functional mobility and gait demonstrating good return for bed mobility, transfers and ambulation in  room/hallway without loss of balance.  Plan:  Patient discharged from physical therapy to care of nursing for ambulation daily as tolerated for length of stay.         Recommendations for follow up therapy are one component of a multi-disciplinary discharge planning process, led by the attending physician.  Recommendations may be updated based on patient status, additional functional criteria and insurance authorization.  Follow Up Recommendations No PT follow up      Assistance Recommended at Discharge PRN  Patient can return home with the following  Help with stairs or ramp for entrance;Assistance with cooking/housework;A little help with bathing/dressing/bathroom;A little help with walking and/or transfers    Equipment Recommendations None recommended by PT  Recommendations for Other Services       Functional Status Assessment Patient has had a recent decline in their functional status and/or demonstrates limited ability to make significant improvements in function in a reasonable and predictable amount of time     Precautions / Restrictions Precautions Precautions: None Restrictions Weight Bearing Restrictions: No      Mobility  Bed Mobility Overal bed mobility: Modified Independent                  Transfers Overall transfer level: Modified independent                      Ambulation/Gait Ambulation/Gait assistance: Modified independent (Device/Increase time) Gait Distance (Feet): 80 Feet Assistive device: None Gait Pattern/deviations: WFL(Within Functional Limits) Gait velocity:  near normal     General Gait Details: grossly WFL with good return for ambulating in room, hallway without loss of balance without AD and pushing O2 tank in roller  Stairs            Wheelchair Mobility    Modified Rankin (Stroke Patients Only)       Balance Overall balance assessment: No apparent balance deficits (not formally assessed)                                            Pertinent Vitals/Pain Pain Assessment Pain Assessment: 0-10 Pain Score: 8  Pain Location: chronic low back pain Pain Descriptors / Indicators: Sore Pain Intervention(s): Limited activity within patient's tolerance, Monitored during session, Repositioned    Home Living Family/patient expects to be discharged to:: Private residence Living Arrangements: Other relatives Available Help at Discharge: Family;Available 24 hours/day;Personal care attendant Type of Home: House Home Access: Stairs to enter Entrance Stairs-Rails: None Entrance Stairs-Number of Steps: 3 Alternate Level Stairs-Number of Steps: patient does not go upstairs Home Layout: Two level;Able to live on main level with bedroom/bathroom;Full bath on main level Home Equipment: Cane - single point;Hospital bed;Shower seat;[name]/3in1      Prior Function Prior Level of Function : Needs assist       Physical Assist : Mobility (physical);ADLs (physical) Mobility (physical): Bed mobility;Transfers;Gait;Stairs   Mobility Comments: household and short distanced community ambulator with SPC PRN or pushing O2 tank in roller ADLs Comments: assisted by family, has home aides 8 hours/day x 5 days/week     Hand Dominance   Dominant Hand: Left    Extremity/Trunk Assessment   Upper Extremity Assessment Upper Extremity Assessment: Overall WFL for tasks assessed    Lower Extremity Assessment Lower Extremity Assessment: Overall WFL for tasks assessed    Cervical / Trunk Assessment Cervical / Trunk Assessment: Normal  Communication   Communication: No difficulties;Tracheostomy  Cognition Arousal/Alertness: Awake/alert Behavior During Therapy: WFL for tasks assessed/performed Overall Cognitive Status: Within Functional Limits for tasks assessed                                          General Comments      Exercises     Assessment/Plan    PT Assessment  Patient does not need any further PT services  PT Problem List         PT Treatment Interventions      PT Goals (Current goals can be found in the Care Plan section)  Acute Rehab PT Goals Patient Stated Goal: return home with family to assist PT Goal Formulation: With patient/family Time For Goal Achievement: 11/28/21 Potential to Achieve Goals: Good    Frequency       Co-evaluation               AM-PAC PT "6 Clicks" Mobility  Outcome Measure Help needed turning from your back to your side while in a flat bed without using bedrails?: None Help needed moving from lying on your back to sitting on the side of a flat bed without using bedrails?: None Help needed moving to and from a bed to a chair (including a wheelchair)?: None Help needed standing up from a chair using your arms (e.g., wheelchair or bedside chair)?: None Help  needed to walk in hospital room?: A Little Help needed climbing 3-5 steps with a railing? : A Little 6 Click Score: 22    End of Session Equipment Utilized During Treatment: Oxygen Activity Tolerance: Patient tolerated treatment well;Patient limited by fatigue Patient left: in bed;with call bell/phone within reach Nurse Communication: Mobility status PT Visit Diagnosis: Unsteadiness on feet (R26.81);Other abnormalities of gait and mobility (R26.89);Muscle weakness (generalized) (M62.81)    Time: 6195-0932 PT Time Calculation (min) (ACUTE ONLY): 10 min   Charges:   PT Evaluation $PT Eval Low Complexity: 1 Low PT Treatments $Therapeutic Activity: 8-22 mins        12:28 PM, 11/28/21 Ocie Bob, MPT Physical Therapist with [facility] [phone] office [phone] mobile phone

## 2021-11-28 NOTE — Telephone Encounter (Signed)
 *  STAT* If patient is at the pharmacy, call can be transferred to refill team.   1. Which medications need to be refilled? (please list name of each medication and dose if known) nitroglycerine   2. Which pharmacy/location (including street and city if local pharmacy) is medication to be sent to?[facility] - [place], [place] - [address]  3. Do they need a 30 day or 90 day supply? 1 bottle

## 2021-11-28 NOTE — Progress Notes (Signed)
Patient discharged home today, transported home by sister. Discharge paperwork went over with patient and sister, both verbalized understanding. Belongings sent home with patient.

## 2021-11-28 NOTE — Telephone Encounter (Signed)
Completed.

## 2021-11-28 NOTE — Discharge Summary (Signed)
Physician Discharge Summary   Patient: Charles Weeks MRN: [id number] DOB: [birth year]  Admit date:     11/21/2021  Discharge date: 11/28/21  Discharge Physician: Kendell Bane   PCP: Anabel Halon, MD   Follow-up with the PCP and pulmonologist within 1 -2 weeks Continue current medication, continue O2 sats, maintain O2 sat 92-94% Continue RT, O2 via tracheostomy, tracheostomy care as directed  Discharge Diagnoses: Principal Problem:   SIRS (systemic inflammatory response syndrome) (HCC) Active Problems:   COPD (chronic obstructive pulmonary disease) (HCC)   Depression with anxiety   Colitis   AKI (acute kidney injury) (HCC)   Chronic pain   GERD (gastroesophageal reflux disease)   HLD (hyperlipidemia)   GI bleed   Vomiting   Ileus (HCC)   Anemia   Hospital Course: 56 year old male with a history of COPD, chronic respiratory failure on 3 L of oxygen, tracheostomy dependent, depression, anxiety, admitted to the hospital with shortness of breath.  Although he did not have any significant pneumonia on chest imaging, he was noted to have possible colitis and ileus on CT abdomen.  He was admitted for further evaluation and started on antibiotics.  Seen by GI.  Started to have vomiting and had NG tube placed for decompression.  He was continued on bowel rest and IV fluids.  Received Gastrografin for SBO protocol.  He started to have bowel movements and is passing gas.  NG tube was discontinued on 7/11.  Slowly tolerating orals.  Still remains hypokalemic   Assessment and Plan:  Sepsis, present on admission, ruled in -Sepsis physiology has improved - Antibiotics-Rocephin and Flagyl IV x  6 days  -  Developed rash to Cipro -IV fluids -- D/Ced  - Cultures-remain negative   Hypokalemia -S/P Aggressive repletion magnesium levels okay,    Colitis -S/p treatment with IV antibiotics.  GI team following.   Ileus -Initially required NG tube for decompression.  Received small  bowel follow-through.  Now passing gas. -Tolerating p.o., advancing diet - GI following-we will plan outpatient colonoscopy - MiraLAX twice daily - Diet-soft   Acute kidney injury -Baseline 0.7.  Initially 1.07 now this is resolved.   COPD -Bronchodilators.  Not an active exacerbation.   Chronic respiratory failure with hypoxia, 3 L Trach dependent -Trach dependent.  On 3 L nasal cannula   Heme positive stools -Secondary to colitis.  GI following.  Plans for outpatient colonoscopy   Chronic pain syndrome -As needed pain medications.   Depression, anxiety -Continue Wellbutrin, BuSpar, Seroquel   Urinary retention -In and out cath as needed.      Code Status: Full code Family Communication: At bedside    Consultants: Gastroenterologist  Procedures performed: None   Disposition: Home Diet recommendation:  Discharge Diet Orders (From admission, onward)     Start     Ordered   11/28/21 0000  Diet - low sodium heart healthy        11/28/21 0813           Regular diet DISCHARGE MEDICATION: Allergies as of 11/28/2021       Reactions   Ciprofloxacin Hives        Medication List     STOP taking these medications    pantoprazole 40 MG tablet Commonly known as: PROTONIX       TAKE these medications    acetaminophen 325 MG tablet Commonly known as: TYLENOL Take 2 tablets (650 mg total) by mouth every 6 (six) hours as needed for mild pain  or moderate pain.   albuterol 108 (90 Base) MCG/ACT inhaler Commonly known as: ProAir HFA Inhale 1-2 puffs into the lungs every 4 (four) hours as needed for wheezing or shortness of breath. What changed:  how much to take when to take this   albuterol (2.5 MG/3ML) 0.083% nebulizer solution Commonly known as: PROVENTIL INHALE (1) VIAL VIA NEBULIZATION EVERY SIX HOURS AS NEEDED FOR WHEEZING OR SHORTNESS OF BREATH. What changed: Another medication with the same name was changed. Make sure you understand how and  when to take each.   aspirin 81 MG chewable tablet Chew 1 tablet (81 mg total) by mouth daily.   bethanechol 25 MG tablet Commonly known as: URECHOLINE TAKE TWO TABLETS BY MOUTH FOUR TIMES DAILY What changed: See the new instructions.   budesonide-formoterol 160-4.5 MCG/ACT inhaler Commonly known as: Symbicort Take 2 puffs first thing in am and then another 2 puffs about 12 hours later. What changed:  how much to take how to take this when to take this   buPROPion 300 MG 24 hr tablet Commonly known as: WELLBUTRIN XL TAKE 1 TABLET BY MOUTH ONCE DAILY.   busPIRone 10 MG tablet Commonly known as: BUSPAR TAKE ONE TABLET BY MOUTH 3 TIMES A DAY   Calcium 600/Vitamin D 600-10 MG-MCG Chew Generic drug: Calcium Carb-Cholecalciferol Chew 1 each by mouth daily.   clonazePAM 0.5 MG tablet Commonly known as: KLONOPIN TAKE 1 TABLET BY MOUTH AT BEDTIME.   docusate sodium 100 MG capsule Commonly known as: COLACE Take 200 mg by mouth daily as needed for moderate constipation.   folic acid 1 MG tablet Commonly known as: FOLVITE TAKE ONE TABLET BY MOUTH ONCE DAILY.   montelukast 10 MG tablet Commonly known as: SINGULAIR TAKE ONE TABLET BY MOUTH AT BEDTIME   nitroGLYCERIN 0.4 MG SL tablet Commonly known as: NITROSTAT Place 0.4 mg under the tongue every 5 (five) minutes as needed for chest pain.   oxyCODONE 5 MG immediate release tablet Commonly known as: Oxy IR/ROXICODONE Take 1 tablet (5 mg total) by mouth 4 (four) times daily as needed for moderate pain.   OXYGEN Inhale 3 L into the lungs continuous.   QUEtiapine 300 MG tablet Commonly known as: SEROQUEL TAKE (1/2) A TABLET BY MOUTH TWICE A DAY, AND (1) TABLET AT BEDTIME.   rosuvastatin 5 MG tablet Commonly known as: CRESTOR TAKE ONE TABLET BY MOUTH ONCE DAILY   senna-docusate 8.6-50 MG tablet Commonly known as: Senokot-S Take 1 tablet by mouth at bedtime as needed for moderate constipation.   tamsulosin 0.4 MG Caps  capsule Commonly known as: FLOMAX TAKE (1) CAPSULE BY MOUTH TWICE DAILY. What changed: See the new instructions.   terazosin 5 MG capsule Commonly known as: HYTRIN Take 1 capsule (5 mg total) by mouth daily.   Barista For transporting to and from Doctors appointments   UNABLE TO FIND Med Name: Bedside Table   Vitamin D3 125 MCG (5000 UT) Caps Take 5,000 Units by mouth daily.   Xtampza ER 13.5 MG C12a Generic drug: oxyCODONE ER Take 1 capsule by mouth every 12 (twelve) hours.        Discharge Exam: Filed Weights   11/22/21 0505 11/23/21 0500 11/24/21 2043  Weight: 88.1 kg 86.8 kg 85.8 kg      Physical Exam:   General:  AAO x 3,  cooperative, no distress; chronic trach with 3 L of oxygen via trach  HEENT:  Normocephalic, PERRL, otherwise with in Normal limits  Neuro:  CNII-XII intact. , normal motor and sensation, reflexes intact   Lungs:   Clear to auscultation BL, Respirations unlabored,  No wheezes / crackles  Cardio:    S1/S2, RRR, No murmure, No Rubs or Gallops   Abdomen:  Soft, non-tender, bowel sounds active all four quadrants, no guarding or peritoneal signs.  Muscular  skeletal:  Limited exam -global generalized weaknesses - in bed, able to move all 4 extremities,   2+ pulses,  symmetric, No pitting edema  Skin:  Dry, warm to touch, negative for any Rashes,  Wounds: Please see nursing documentation          Condition at discharge: good  The results of significant diagnostics from this hospitalization (including imaging, microbiology, ancillary and laboratory) are listed below for reference.   Imaging Studies: DG Abd Portable 1V-Small Bowel Obstruction Protocol-initial, 8 hr delay  Result Date: 11/26/2021 CLINICAL DATA:  Possible colitis an ileus, 8 hour delayed abdominal films EXAM: PORTABLE ABDOMEN - 1 VIEW COMPARISON:  11/24/2021 FINDINGS: Enteric tube terminates in the proximal gastric body. Nonobstructive bowel gas pattern.  Contrast opacifies the right colon at the time of imaging. IMPRESSION: Enteric tube terminates in the proximal gastric body. Moderate bowel gas pattern. Contrast opacifies the right colon at the time of imaging. Electronically Signed   By: Charline Bills M.D.   On: 11/26/2021 02:49   DG Abd 1 View  Result Date: 11/24/2021 CLINICAL DATA:  Abdominal distension. EXAM: ABDOMEN - 1 VIEW COMPARISON:  11/23/2021, CT 11/21/2021. FINDINGS: There is no bowel dilation to suggest obstruction. Nasal/orogastric tube extends well into the stomach, unchanged. No convincing renal or ureteral stones. Soft tissues are unremarkable. IMPRESSION: 1. No acute findings.  No evidence of bowel obstruction. Electronically Signed   By: Amie Portland M.D.   On: 11/24/2021 08:47   DG Abd 1 View  Result Date: 11/23/2021 CLINICAL DATA:  Check nasogastric catheter placement EXAM: ABDOMEN - 1 VIEW COMPARISON:  None Available. FINDINGS: Cardiac shadow is within normal limits. Gastric catheter is noted extending into the stomach. Tip of the tracheostomy tube is noted in satisfactory position. Lungs are hypoinflated. IMPRESSION: Gastric catheter within the stomach. Electronically Signed   By: Alcide Clever M.D.   On: 11/23/2021 00:50   DG Abd 1 View  Result Date: 11/22/2021 CLINICAL DATA:  Enteric catheter placement EXAM: ABDOMEN - 1 VIEW COMPARISON:  08/31/2020 FINDINGS: Frontal view of the lower chest and upper abdomen demonstrates enteric catheter passing below diaphragm, tip and side port projecting over gastric body. Bowel gas pattern is unremarkable. Lung bases are clear. IMPRESSION: 1. Enteric catheter projecting over gastric body. Electronically Signed   By: Sharlet Salina M.D.   On: 11/22/2021 17:55   CT Abdomen Pelvis W Contrast  Result Date: 11/22/2021 CLINICAL DATA:  Acute abdominal pain. EXAM: CT ABDOMEN AND PELVIS WITH CONTRAST TECHNIQUE: Multidetector CT imaging of the abdomen and pelvis was performed using the standard  protocol following bolus administration of intravenous contrast. RADIATION DOSE REDUCTION: This exam was performed according to the departmental dose-optimization program which includes automated exposure control, adjustment of the mA and/or kV according to patient size and/or use of iterative reconstruction technique. CONTRAST:  OMNIPAQUE IOHEXOL 300 MG/ML  SOLN COMPARISON:  08/17/2020 FINDINGS: Lower chest: Partial atelectasis of the right middle lobe. Basilar emphysema. There is fluid within the distal esophagus. Hepatobiliary: Tiny hypodensity at the hepatic dome, too small to characterize. No suspicious liver lesion. Gallbladder physiologically distended, no calcified stone. No biliary dilatation.  Pancreas: Mildly atrophic.  No ductal dilatation or inflammation. Spleen: Normal in size without focal abnormality. Calcified granuloma. Few splenules are seen inferiorly. Adrenals/Urinary Tract: Normal adrenal glands. No hydronephrosis. Mild perinephric edema about the inferior left kidney. No renal calculi. Unremarkable urinary bladder. Stomach/Bowel: Wall thickening and pericolonic edema of the colon from the splenic flexure through the distal descending. Findings consistent with colitis. Enteric chain sutures are noted in the sigmoid colon. The appendix is visualized and normal. Mild fluid and air distension of small bowel in the upper abdomen, maximal dimension 3.9 cm. There is gaseous gastric distension. Small duodenal diverticulum. Fluid within the distal esophagus. No definite gastric or small bowel inflammation. Vascular/Lymphatic: Moderate aortic atherosclerosis. No aneurysm. No evidence of mesenteric and bowel is disease. Patent mesenteric and portal vein. No abdominopelvic adenopathy. Reproductive: Prostate is unremarkable. Other: No free air. No ascites. No abdominopelvic collection. No abdominal wall hernia. Musculoskeletal: There are no acute or suspicious osseous abnormalities. IMPRESSION: 1.  Colitis from the splenic flexure through the distal descending colon, likely infectious or inflammatory. 2. Gastric and scattered small bowel distension with air and fluid. Findings suggest generalized ileus. There is fluid in the distal esophagus that may be related to reflux or delayed transit. 3. Mild perinephric edema about the inferior left kidney, nonspecific. Recommend correlation with urinalysis to exclude urinary tract infection. 4. Partial atelectasis in the right middle lobe. Aortic Atherosclerosis (ICD10-I70.0) and Emphysema (ICD10-J43.9). Electronically Signed   By: Narda Rutherford M.D.   On: 11/22/2021 00:06   DG Chest Port 1 View  Result Date: 11/21/2021 CLINICAL DATA:  Dyspnea EXAM: PORTABLE CHEST 1 VIEW COMPARISON:  10/29/2020 FINDINGS: Cardiac shadow is stable. Tracheostomy tube is noted in satisfactory position. Lungs are well aerated bilaterally. Very mild interstitial markings are noted without focal confluent infiltrate. No edema is seen. IMPRESSION: Mild interstitial markings greater than that seen on the prior exam. No focal infiltrate is seen. Electronically Signed   By: Alcide Clever M.D.   On: 11/21/2021 19:31    Microbiology: Results for orders placed or performed during the hospital encounter of 11/21/21  Culture, blood (Routine X 2) w Reflex to ID Panel     Status: None   Collection Time: 11/22/21  6:40 AM   Specimen: BLOOD LEFT HAND  Result Value Ref Range Status   Specimen Description BLOOD LEFT HAND  Final   Special Requests   Final    BOTTLES DRAWN AEROBIC AND ANAEROBIC Blood Culture adequate volume   Culture   Final    NO GROWTH 5 DAYS Performed at [facility], [address], [place], [place] [address]    Report Status 11/27/2021 FINAL  Final  Culture, blood (Routine X 2) w Reflex to ID Panel     Status: None   Collection Time: 11/22/21  6:40 AM   Specimen: Left Antecubital; Blood  Result Value Ref Range Status   Specimen Description LEFT ANTECUBITAL  Final    Special Requests   Final    BOTTLES DRAWN AEROBIC AND ANAEROBIC Blood Culture adequate volume   Culture   Final    NO GROWTH 5 DAYS Performed at [facility], [address] [place], [place] [address]    Report Status 11/27/2021 FINAL  Final  MRSA Next Gen by PCR, Nasal     Status: None   Collection Time: 11/22/21  3:36 PM   Specimen: Nasal Mucosa; Nasal Swab  Result Value Ref Range Status   MRSA by PCR Next Gen NOT DETECTED NOT  DETECTED Final    Comment: (NOTE) The GeneXpert MRSA Assay (FDA approved for NASAL specimens only), is one component of a comprehensive MRSA colonization surveillance program. It is not intended to diagnose MRSA infection nor to guide or monitor treatment for MRSA infections. Test performance is not FDA approved in patients less than 73 years old. Performed at [facility], [address], [place], [place] [address]     Labs: CBC: Recent Labs  Lab 11/21/21 1908 11/22/21 0357 11/23/21 0341 11/24/21 0354 11/25/21 0413 11/26/21 1311 11/27/21 0837  WBC 18.3* 16.3* 13.6* 11.6* 10.3 9.2 7.7  NEUTROABS 14.6* 14.4*  --   --   --   --   --   HGB 15.6 14.1 13.2 12.3* 11.5* 11.3* 12.5*  HCT 48.3 42.7 40.4 37.7* 35.1* 34.2* 37.1*  MCV 92.2 91.2 91.2 91.3 91.2 92.2 89.0  PLT 236 213 199 220 208 196 238   Basic Metabolic Panel: Recent Labs  Lab 11/22/21 0357 11/23/21 0341 11/24/21 0354 11/25/21 0413 11/26/21 0441 11/27/21 0451 11/28/21 0422  NA 141   < > 142 141 138 140 140  K 3.9   < > 3.3* 3.4* 3.4* 2.8* 3.5  CL 106   < > 106 105 106 106 108  CO2 27   < > 29 29 22 25 28   GLUCOSE 135*   < > 103* 74 100* 142* 140*  BUN 15   < > 7 8 8  <5* 7  CREATININE 1.07   < > 0.78 0.80 0.75 0.75 0.84  CALCIUM 8.7*   < > 8.2* 8.3* 8.3* 8.3* 8.2*  MG 2.1  --  1.9  --   --  1.9 1.9  PHOS  --   --  2.7  --   --  3.0 4.2   < > = values in this interval not displayed.   Liver Function Tests: Recent Labs  Lab 11/21/21 1908 11/22/21 0357 11/24/21 0354  AST  25 20  --   ALT 27 21  --   ALKPHOS 143* 116  --   BILITOT 0.6 0.5  --   PROT 8.0 6.8  --   ALBUMIN 4.6 3.8 3.0*   CBG: Recent Labs  Lab 11/27/21 0724 11/27/21 1109 11/27/21 1708 11/27/21 2145 11/28/21 0710  GLUCAP 132* 146* 130* 135* 126*    Discharge time spent: greater than 30 minutes.  Signed: Kendell Bane, MD [facility] 11/28/2021

## 2021-11-28 NOTE — Telephone Encounter (Signed)
Please arrange for hospital follow-up in the next 3 to 4 weeks.

## 2021-11-29 ENCOUNTER — Telehealth: Payer: Self-pay | Admitting: Internal Medicine

## 2021-11-29 ENCOUNTER — Telehealth: Payer: Self-pay | Admitting: *Deleted

## 2021-11-29 NOTE — Telephone Encounter (Signed)
Pt was discharged yesterday 11/28/2021. He is scheduled for 12/11/21. Can you please do toc call when available?

## 2021-11-29 NOTE — Telephone Encounter (Signed)
Lmom to call office for [facility] call with nurse and we will try and move appt up with Dr Allena Katz.

## 2021-11-29 NOTE — Telephone Encounter (Signed)
Transition Care Management Unsuccessful Follow-up Telephone Call  Date of discharge and from where:  11/28/21 [facility]  Attempts:  2nd Attempt  Reason for unsuccessful TCM follow-up call:  Left voice message

## 2021-11-29 NOTE — Telephone Encounter (Signed)
Transition Care Management Unsuccessful Follow-up Telephone Call  Date of discharge and from where:  [facility]  Attempts:  1st Attempt  Reason for unsuccessful TCM follow-up call:  Left voice message

## 2021-11-29 NOTE — Telephone Encounter (Signed)
Transition Care Management Follow-up Telephone Call Date of discharge and from where: 7/13 [facility] How have you been since you were released from the hospital? no Any questions or concerns? No  Items Reviewed: Did the pt receive and understand the discharge instructions provided? No  Medications obtained and verified? Yes  Other? No  Any new allergies since your discharge? Yes  Dietary orders reviewed? Yes Do you have support at home? Yes   Home Care and Equipment/Supplies: Were home health services ordered? no If so, what is the name of the agency? na  Has the agency set up a time to come to the patient's home? not applicable Were any new equipment or medical supplies ordered?  No What is the name of the medical supply agency? na Were you able to get the supplies/equipment? not applicable Do you have any questions related to the use of the equipment or supplies? No  Functional Questionnaire: (I = Independent and D = Dependent) ADLs: i  Bathing/Dressing- i  Meal Prep- i  Eating- i  Maintaining continence- i  Transferring/Ambulation- i  Managing Meds- i  Follow up appointments reviewed:  PCP Hospital f/u appt confirmed? Yes  Scheduled to see Allena Katz on July 19 @ 1140. Specialist Hospital f/u appt confirmed? No   Are transportation arrangements needed? No  If their condition worsens, is the pt aware to call PCP or go to the Emergency Dept.? Yes Was the patient provided with contact information for the PCP's office or ED? Yes Was to pt encouraged to call back with questions or concerns? Yes

## 2021-12-02 NOTE — Progress Notes (Unsigned)
Cardiology Office Note   Date:  12/02/2021   ID:  Charles Weeks, DOB 12/13/1965, MRN [id number]  PCP:  Anabel Halon, MD  Cardiologist:  Dr. Diona Browner    No chief complaint on file.     History of Present Illness: Charles Weeks is a 56 y.o. male who presents for *** history of CAD status post DES to the RCA 05/2018, hypertension, COPD, quit smoking.    Hx in  2022 of significant fall broke his neck/back, 8 ribs, punctured lung, laceration of head but no skull fracture. He had 4-5 beers prior to it happening. Also had narcotics in system that weren't prescribed.  Ild tropoin bump and needed to be intubated in the setting of metabolic and resp acidsosis and encephalopathy/AKI.  Having trach reversal Aug 31.Echo 08/18/20 trivial pericardial effusion, normal LVEF 55-60%.  Last visit he had chest pain and nuc study was done which was neg.     Past Medical History:  Diagnosis Date   Anxiety 01/19/2020   Arthritis    Asthma    Benign prostatic hyperplasia with weak urinary stream 12/08/2019   BPH (benign prostatic hyperplasia) 08/17/2020   Chronic respiratory failure with hypoxia (HCC) 01/24/2020   Has home 02 but not using as of 01/24/2020  -  01/24/2020   Walked RA  approx   200 ft  @ slow pace  stopped due to  Dizzy with sats still 95%      Cigarette smoker 01/24/2020   Stopped regular smoking 10/2019 but still "now and then" as of 01/24/2020    COPD (chronic obstructive pulmonary disease) (HCC) 08/17/2020   gold   COPD GOLD ?     Quit smoking 10/2019  - Labs ordered 01/24/2020  :  allergy profile   alpha one AT phenotype   - 01/24/2020  After extensive coaching inhaler device,  effectiveness =    75% (short Ti) > change symb 80 to 160 2bid      Coronary artery disease 06/08/2018   DES to RCA, [facility] [place], [place])   Depression, major, single episode, moderate (HCC) 01/19/2020   Dyspnea    Encounter for screening for malignant neoplasm of colon 12/08/2019   Essential  hypertension 12/08/2019   History of MI (myocardial infarction) 01/19/2020   HTN (hypertension) 08/17/2020   Myocardial infarct (HCC) 2019   Pneumonia     Past Surgical History:  Procedure Laterality Date   BALLOON DILATION N/A 02/06/2021   Procedure: BALLOON DILATION;  Surgeon: Christia Reading, MD;  Location: [facility] OR;  Service: ENT;  Laterality: N/A;   BOWEL RESECTION  2006   MICROLARYNGOSCOPY WITH LASER AND BALLOON DILATION N/A 02/06/2021   Procedure: SUSPENDED MICRO DIRECT LARYNGOSCOPY;  Surgeon: Christia Reading, MD;  Location: [facility] OR;  Service: ENT;  Laterality: N/A;   PEG PLACEMENT N/A 09/03/2020   Procedure: PERCUTANEOUS ENDOSCOPIC GASTROSTOMY (PEG) PLACEMENT;  Surgeon: Violeta Gelinas, MD;  Location: [facility] OR;  Service: General;  Laterality: N/A;   PERCUTANEOUS TRACHEOSTOMY N/A 09/03/2020   Procedure: PERCUTANEOUS TRACHEOSTOMY USING 6 SHILEY;  Surgeon: Violeta Gelinas, MD;  Location: [facility] OR;  Service: General;  Laterality: N/A;   SKIN GRAFT     TONSILLECTOMY AND ADENOIDECTOMY       Current Outpatient Medications  Medication Sig Dispense Refill   acetaminophen (TYLENOL) 325 MG tablet Take 2 tablets (650 mg total) by mouth every 6 (six) hours as needed for mild pain or moderate pain.     albuterol (PROAIR HFA) 108 (90  Base) MCG/ACT inhaler Inhale 1-2 puffs into the lungs every 4 (four) hours as needed for wheezing or shortness of breath. (Patient taking differently: Inhale 2 puffs into the lungs 2 (two) times daily as needed for wheezing or shortness of breath.) 8.5 g 11   albuterol (PROVENTIL) (2.5 MG/3ML) 0.083% nebulizer solution INHALE (1) VIAL VIA NEBULIZATION EVERY SIX HOURS AS NEEDED FOR WHEEZING OR SHORTNESS OF BREATH. 180 mL 2   aspirin 81 MG chewable tablet Chew 1 tablet (81 mg total) by mouth daily.     bethanechol (URECHOLINE) 25 MG tablet TAKE TWO TABLETS BY MOUTH FOUR TIMES DAILY (Patient taking differently: Take 50 mg by mouth 4 (four) times daily.) 180 tablet 0    budesonide-formoterol (SYMBICORT) 160-4.5 MCG/ACT inhaler Take 2 puffs first thing in am and then another 2 puffs about 12 hours later. (Patient taking differently: Inhale 2 puffs into the lungs in the morning and at bedtime. Take 2 puffs first thing in am and then another 2 puffs about 12 hours later.) 1 each 12   buPROPion (WELLBUTRIN XL) 300 MG 24 hr tablet TAKE 1 TABLET BY MOUTH ONCE DAILY. 30 tablet 0   busPIRone (BUSPAR) 10 MG tablet TAKE ONE TABLET BY MOUTH 3 TIMES A DAY 90 tablet 5   Calcium Carb-Cholecalciferol (CALCIUM 600/VITAMIN D) 600-400 MG-UNIT CHEW Chew 1 each by mouth daily.     Cholecalciferol (VITAMIN D3) 125 MCG (5000 UT) CAPS Take 5,000 Units by mouth daily.     clonazePAM (KLONOPIN) 0.5 MG tablet TAKE 1 TABLET BY MOUTH AT BEDTIME. 30 tablet 2   docusate sodium (COLACE) 100 MG capsule Take 200 mg by mouth daily as needed for moderate constipation.     folic acid (FOLVITE) 1 MG tablet TAKE ONE TABLET BY MOUTH ONCE DAILY. 90 tablet 3   Misc. Devices Theme park manager) MISC For transporting to and from Doctors appointments 1 each 0   montelukast (SINGULAIR) 10 MG tablet TAKE ONE TABLET BY MOUTH AT BEDTIME 90 tablet 3   nitroGLYCERIN (NITROSTAT) 0.4 MG SL tablet Place 1 tablet (0.4 mg total) under the tongue every 5 (five) minutes as needed for chest pain. 25 tablet 2   oxyCODONE (OXY IR/ROXICODONE) 5 MG immediate release tablet Take 1 tablet (5 mg total) by mouth 4 (four) times daily as needed for moderate pain. 120 tablet 0   oxyCODONE ER (XTAMPZA ER) 13.5 MG C12A Take 1 capsule by mouth every 12 (twelve) hours. 60 capsule 0   OXYGEN Inhale 3 L into the lungs continuous.     pantoprazole (PROTONIX) 40 MG tablet TAKE ONE TABLET BY MOUTH ONCE DAILY. 90 tablet 0   polyethylene glycol (MIRALAX / GLYCOLAX) 17 g packet Take 17 g by mouth 2 (two) times daily. 14 each 0   QUEtiapine (SEROQUEL) 300 MG tablet TAKE (1/2) A TABLET BY MOUTH TWICE A DAY, AND (1) TABLET AT BEDTIME. 60 tablet 3    rosuvastatin (CRESTOR) 5 MG tablet TAKE ONE TABLET BY MOUTH ONCE DAILY 90 tablet 0   senna-docusate (SENOKOT-S) 8.6-50 MG tablet Take 1 tablet by mouth at bedtime as needed for moderate constipation. 30 tablet 1   tamsulosin (FLOMAX) 0.4 MG CAPS capsule TAKE (1) CAPSULE BY MOUTH TWICE DAILY. (Patient taking differently: Take 0.4 mg by mouth 2 (two) times daily.) 180 capsule 1   terazosin (HYTRIN) 5 MG capsule Take 1 capsule (5 mg total) by mouth daily. 30 capsule 5   UNABLE TO FIND Med Name: Bedside Table 1 each 0  No current facility-administered medications for this visit.    Allergies:   Ciprofloxacin    Social History:  The patient  reports that he quit smoking about 15 months ago. His smoking use included cigarettes. He smoked an average of 1.5 packs per day. He has never used smokeless tobacco. He reports that he does not currently use alcohol. He reports that he does not currently use drugs.   Family History:  The patient's ***family history includes Diabetes in his sister; Heart disease in his father and mother.    ROS:  General:no colds or fevers, no weight changes Skin:no rashes or ulcers HEENT:no blurred vision, no congestion CV:see HPI PUL:see HPI GI:no diarrhea constipation or melena, no indigestion GU:no hematuria, no dysuria MS:no joint pain, no claudication Neuro:no syncope, no lightheadedness Endo:no diabetes, no thyroid disease Wt Readings from Last 3 Encounters:  11/24/21 189 lb 2.5 oz (85.8 kg)  09/03/21 195 lb (88.5 kg)  06/19/21 172 lb (78 kg)     PHYSICAL EXAM: VS:  There were no vitals taken for this visit. , BMI There is no height or weight on file to calculate BMI. General:Pleasant affect, NAD Skin:Warm and dry, brisk capillary refill HEENT:normocephalic, sclera clear, mucus membranes moist Neck:supple, no JVD, no bruits  Heart:S1S2 RRR without murmur, gallup, rub or click Lungs:clear without rales, rhonchi, or wheezes LDJ:TTSV, non tender, + BS, do  not palpate liver spleen or masses Ext:no lower ext edema, 2+ pedal pulses, 2+ radial pulses Neuro:alert and oriented, MAE, follows commands, + facial symmetry    EKG:  EKG is ordered today. The ekg ordered today demonstrates ***   Recent Labs: 11/22/2021: ALT 21 11/27/2021: Hemoglobin 12.5; Platelets 238 11/28/2021: BUN 7; Creatinine, Ser 0.84; Magnesium 1.9; Potassium 3.5; Sodium 140    Lipid Panel    Component Value Date/Time   CHOL 130 04/23/2021 1041   TRIG 135 04/23/2021 1041   HDL 53 04/23/2021 1041   CHOLHDL 2.5 04/23/2021 1041   VLDL 27 04/23/2021 1041   LDLCALC 50 04/23/2021 1041       Other studies Reviewed: Additional studies/ records that were reviewed today include: . 01/25/21 nuc study   Findings are consistent with prior inferior myocardial infarction. There is no current ischemia. The study is low risk.   No ST deviation was noted.   Left ventricular function is normal. Nuclear stress EF: 50 %. The left ventricular ejection fraction is low normal). End diastolic cavity size is normal.    Echocardiogram 01/31/2020:  1. Left ventricular ejection fraction, by estimation, is 65 to 70%. The  left ventricle has normal function. The left ventricle has no regional  wall motion abnormalities. Left ventricular diastolic parameters were  normal.   2. Right ventricular systolic function is normal. The right ventricular  size is normal.   3. The mitral valve is normal in structure. No evidence of mitral valve  regurgitation. No evidence of mitral stenosis.   4. The aortic valve has an indeterminant number of cusps. Aortic valve  regurgitation is not visualized. No aortic stenosis is present.   5. The inferior vena cava is normal in size with greater than 50%  respiratory variability, suggesting right atrial pressure of 3 mmHg.  ASSESSMENT AND PLAN:  1.  ***   Current medicines are reviewed with the patient today.  The patient Has no concerns regarding  medicines.  The following changes have been made:  See above Labs/ tests ordered today include:see above  Disposition:   FU:  see above  Signed, Nada Boozer, NP  12/02/2021 5:05 PM    [facility] [address] [place], [place], [place]  [address]/ [address] [place], [place] Phone: [phone]; Fax: [phone]  [phone]

## 2021-12-02 NOTE — Telephone Encounter (Signed)
It is too late for [facility] call I attempted 1 time before I left on Friday and there needs to be 3 attempts please change TOC to follow up

## 2021-12-03 ENCOUNTER — Other Ambulatory Visit (HOSPITAL_COMMUNITY)
Admission: RE | Admit: 2021-12-03 | Discharge: 2021-12-03 | Disposition: A | Discharge status: Home / Self Care | Payer: Medicaid Other | Source: Ambulatory Visit | Attending: Cardiology | Admitting: Cardiology

## 2021-12-03 ENCOUNTER — Ambulatory Visit (INDEPENDENT_AMBULATORY_CARE_PROVIDER_SITE_OTHER): Payer: Medicaid Other | Admitting: Cardiology

## 2021-12-03 ENCOUNTER — Encounter: Payer: Self-pay | Admitting: Cardiology

## 2021-12-03 VITALS — BP 132/76 | HR 109 | Ht 68.0 in | Wt 191.2 lb

## 2021-12-03 DIAGNOSIS — Z930 Tracheostomy status: Secondary | ICD-10-CM

## 2021-12-03 DIAGNOSIS — J449 Chronic obstructive pulmonary disease, unspecified: Secondary | ICD-10-CM

## 2021-12-03 DIAGNOSIS — R000 Tachycardia, unspecified: Secondary | ICD-10-CM | POA: Diagnosis not present

## 2021-12-03 DIAGNOSIS — R339 Retention of urine, unspecified: Secondary | ICD-10-CM | POA: Insufficient documentation

## 2021-12-03 DIAGNOSIS — I2510 Atherosclerotic heart disease of native coronary artery without angina pectoris: Secondary | ICD-10-CM

## 2021-12-03 DIAGNOSIS — R509 Fever, unspecified: Secondary | ICD-10-CM | POA: Insufficient documentation

## 2021-12-03 DIAGNOSIS — J9611 Chronic respiratory failure with hypoxia: Secondary | ICD-10-CM

## 2021-12-03 LAB — URINALYSIS, ROUTINE W REFLEX MICROSCOPIC
Bacteria, UA: NONE SEEN
Bilirubin Urine: NEGATIVE
Glucose, UA: NEGATIVE mg/dL
Ketones, ur: NEGATIVE mg/dL
Leukocytes,Ua: NEGATIVE
Nitrite: NEGATIVE
Protein, ur: NEGATIVE mg/dL
Specific Gravity, Urine: 1.005 (ref 1.005–1.030)
pH: 7 (ref 5.0–8.0)

## 2021-12-03 NOTE — Patient Instructions (Signed)
Medication Instructions:  Your physician recommends that you continue on your current medications as directed. Please refer to the Current Medication list given to you today.   *If you need a refill on your cardiac medications before your next appointment, please call your pharmacy*   Lab Work: Your physician recommends that you return for lab work in: Today   If you have labs (blood work) drawn today and your tests are completely normal, you will receive your results only by: MyChart Message (if you have MyChart) OR A paper copy in the mail If you have any lab test that is abnormal or we need to change your treatment, we will call you to review the results.   Testing/Procedures: NONE    Follow-Up: At [facility], you and your health needs are our priority.  As part of our continuing mission to provide you with exceptional heart care, we have created designated Provider Care Teams.  These Care Teams include your primary Cardiologist (physician) and Advanced Practice Providers (APPs -  Physician Assistants and Nurse Practitioners) who all work together to provide you with the care you need, when you need it.  We recommend signing up for the patient portal called "MyChart".  Sign up information is provided on this After Visit Summary.  MyChart is used to connect with patients for Virtual Visits (Telemedicine).  Patients are able to view lab/test results, encounter notes, upcoming appointments, etc.  Non-urgent messages can be sent to your provider as well.   To learn more about what you can do with MyChart, go to [email or web address].    Your next appointment:   3 month(s)  The format for your next appointment:   In Person  Provider:   You may see Nona Dell, MD or one of the following Advanced Practice Providers on your designated Care Team:   Randall An, PA-C  Jacolyn Reedy, PA-C     Other Instructions Thank you for choosing [facility]!    Important Information About Sugar

## 2021-12-04 ENCOUNTER — Ambulatory Visit: Payer: Medicaid Other | Admitting: Internal Medicine

## 2021-12-04 ENCOUNTER — Encounter: Payer: Self-pay | Admitting: Internal Medicine

## 2021-12-04 VITALS — BP 122/76 | HR 96 | Ht 68.0 in | Wt 189.0 lb

## 2021-12-04 DIAGNOSIS — R5381 Other malaise: Secondary | ICD-10-CM | POA: Diagnosis not present

## 2021-12-04 DIAGNOSIS — J9611 Chronic respiratory failure with hypoxia: Secondary | ICD-10-CM

## 2021-12-04 DIAGNOSIS — K529 Noninfective gastroenteritis and colitis, unspecified: Secondary | ICD-10-CM | POA: Diagnosis not present

## 2021-12-04 DIAGNOSIS — Z09 Encounter for follow-up examination after completed treatment for conditions other than malignant neoplasm: Secondary | ICD-10-CM

## 2021-12-04 DIAGNOSIS — Z1159 Encounter for screening for other viral diseases: Secondary | ICD-10-CM

## 2021-12-04 DIAGNOSIS — G894 Chronic pain syndrome: Secondary | ICD-10-CM

## 2021-12-04 DIAGNOSIS — E876 Hypokalemia: Secondary | ICD-10-CM

## 2021-12-04 MED ORDER — ONDANSETRON HCL 4 MG PO TABS: 4.0000 mg | ORAL_TABLET | Freq: Three times a day (TID) | ORAL | 0 refills | Status: DC | PRN

## 2021-12-04 MED ORDER — AMOXICILLIN-POT CLAVULANATE 875-125 MG PO TABS: 1.0000 | ORAL_TABLET | Freq: Two times a day (BID) | ORAL | 0 refills | Status: DC

## 2021-12-04 NOTE — Assessment & Plan Note (Signed)
On chronic opioid medications Followed by PM&R

## 2021-12-04 NOTE — Progress Notes (Signed)
Established Patient Office Visit  Subjective:  Patient ID: Charles Weeks, male    DOB: 05/03/1966  Age: 56 y.o. MRN: [id number]  CC:  Chief Complaint  Patient presents with   Follow-up    Following up from ED visit on 07/06 through 07/13, c/o low grade fever yesterday, saw cardiology yesterday, sister would like for pt to have antibiotics.     HPI Charles Weeks is a 56 y.o. 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  He was admitted to the hospital with shortness of breath.  Although he did not have any significant pneumonia on chest imaging, he was noted to have possible colitis and ileus on CT abdomen.  He was admitted for further evaluation and started on antibiotics.  Seen by GI.  Started to have vomiting and had NG tube placed for decompression.  He was continued on bowel rest and IV fluids.  Received Gastrografin for SBO protocol.  He started to have bowel movements. NG tube was discontinued on 7/11.  Slowly tolerating orals.  He still has mild abdominal discomfort, nausea and low-grade fever at home.  Has regular BM.  Denies any melena or hematochezia. His breathing is also improved now.  Past Medical History:  Diagnosis Date   Anxiety 01/19/2020   Arthritis    Asthma    Benign prostatic hyperplasia with weak urinary stream 12/08/2019   BPH (benign prostatic hyperplasia) 08/17/2020   Chronic respiratory failure with hypoxia ([place]) 01/24/2020   Has home 02 but not using as of 01/24/2020  -  01/24/2020   Walked RA  approx   200 ft  @ slow pace  stopped due to  Dizzy with sats still 95%      Cigarette smoker 01/24/2020   Stopped regular smoking 10/2019 but still "now and then" as of 01/24/2020    COPD (chronic obstructive pulmonary disease) ([place]) 08/17/2020   gold   COPD GOLD ?     Quit smoking 10/2019   - Labs ordered 01/24/2020  :  allergy profile   alpha one AT phenotype   - 01/24/2020  After extensive coaching inhaler device,  effectiveness =    75% (short Ti) > change symb 80 to 160 2bid      Coronary artery disease 06/08/2018   DES to RCA, [facility], [place])   Depression, major, single episode, moderate ([place]) 01/19/2020   Dyspnea    Encounter for screening for malignant neoplasm of colon 12/08/2019   Essential hypertension 12/08/2019   History of MI (myocardial infarction) 01/19/2020   HTN (hypertension) 08/17/2020   Myocardial infarct ([place]) 2019   Pneumonia     Past Surgical History:  Procedure Laterality Date   BALLOON DILATION N/A 02/06/2021   Procedure: BALLOON DILATION;  Surgeon: Melida Quitter, MD;  Location: [facility];  Service: ENT;  Laterality: N/A;   BOWEL RESECTION  2006   MICROLARYNGOSCOPY WITH LASER AND BALLOON DILATION N/A 02/06/2021   Procedure: SUSPENDED MICRO DIRECT LARYNGOSCOPY;  Surgeon: Melida Quitter, MD;  Location: [facility];  Service: ENT;  Laterality: N/A;   PEG PLACEMENT N/A 09/03/2020   Procedure: PERCUTANEOUS ENDOSCOPIC GASTROSTOMY (PEG) PLACEMENT;  Surgeon: Georganna Skeans, MD;  Location: [facility];  Service: General;  Laterality: N/A;   PERCUTANEOUS TRACHEOSTOMY N/A 09/03/2020   Procedure: PERCUTANEOUS TRACHEOSTOMY USING 6 SHILEY;  Surgeon: Georganna Skeans, MD;  Location: [facility];  Service: General;  Laterality: N/A;   SKIN GRAFT     TONSILLECTOMY AND ADENOIDECTOMY      Family History  Problem Relation Age of Onset   Heart disease Mother    Heart disease Father    Diabetes Sister     Social History   Socioeconomic History   Marital status: Single    Spouse name: Not on file   Number of children: 2   Years of education: Not on file   Highest education level: 7th grade  Occupational History   Not on file  Tobacco Use   Smoking status: Former    Packs/day: 1.50    Types: Cigarettes    Quit date: 08/17/2020    Years since quitting: 1.2    Smokeless tobacco: Never  Vaping Use   Vaping Use: Never used  Substance and Sexual Activity   Alcohol use: Not Currently   Drug use: Not Currently   Sexual activity: Not on file  Other Topics Concern   Not on file  Social History Narrative   ** Merged History Encounter **       Lives with sister  Both daughters live close by   Sister has dog   Enjoys: shooting pool, fishing, Retail banker  Diet: eats all food groups Caffeine: pop-dr pepper 2 liter  Water: 4-6 cups   Wears seat belt  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 No weapons        Social Determinants of Health   Financial Resource Strain: Low Risk  (12/08/2019)   Overall Financial Resource Strain (CARDIA)    Difficulty of Paying Living Expenses: Not hard at all  Food Insecurity: No Food Insecurity (12/08/2019)   Hunger Vital Sign    Worried About Running Out of Food in the Last Year: Never true    [place] in the Last Year: Never true  Transportation Needs: No Transportation Needs (12/08/2019)   PRAPARE - Hydrologist (Medical): No    Lack of Transportation (Non-Medical): No  Physical Activity: Inactive (12/08/2019)   Exercise Vital Sign    Days of Exercise per Week: 0 days    Minutes of Exercise per Session: 0 min  Stress: No Stress Concern Present (12/08/2019)   [place]    Feeling of Stress : Not at all  Social Connections: Socially Isolated (12/08/2019)   Social Connection and Isolation Panel [NHANES]    Frequency of Communication with Friends and Family: More than three times a week    Frequency of Social Gatherings with Friends and Family: More than three times a week    Attends Religious Services: Never    Marine scientist or Organizations: No    Attends Archivist Meetings: Never    Marital Status: Divorced  Human resources officer Violence: Not At Risk (12/08/2019)    Humiliation, Afraid, Rape, and Kick questionnaire    Fear of Current or Ex-Partner: No    Emotionally Abused: No    Physically Abused: No    Sexually Abused: No    Outpatient Medications Prior to Visit  Medication Sig Dispense Refill   acetaminophen (TYLENOL) 325 MG tablet Take 2 tablets (650 mg total) by mouth every 6 (six) hours as needed for mild pain or moderate pain.     albuterol (PROAIR HFA) 108 (90 Base) MCG/ACT inhaler Inhale 1-2 puffs into the lungs every  4 (four) hours as needed for wheezing or shortness of breath. (Patient taking differently: Inhale 2 puffs into the lungs 2 (two) times daily as needed for wheezing or shortness of breath.) 8.5 g 11   albuterol (PROVENTIL) (2.5 MG/3ML) 0.083% nebulizer solution INHALE (1) VIAL VIA NEBULIZATION EVERY SIX HOURS AS NEEDED FOR WHEEZING OR SHORTNESS OF BREATH. 180 mL 2   aspirin 81 MG chewable tablet Chew 1 tablet (81 mg total) by mouth daily.     bethanechol (URECHOLINE) 25 MG tablet TAKE TWO TABLETS BY MOUTH FOUR TIMES DAILY (Patient taking differently: Take 50 mg by mouth 4 (four) times daily.) 180 tablet 0   budesonide-formoterol (SYMBICORT) 160-4.5 MCG/ACT inhaler Take 2 puffs first thing in am and then another 2 puffs about 12 hours later. (Patient taking differently: Inhale 2 puffs into the lungs in the morning and at bedtime. Take 2 puffs first thing in am and then another 2 puffs about 12 hours later.) 1 each 12   buPROPion (WELLBUTRIN XL) 300 MG 24 hr tablet TAKE 1 TABLET BY MOUTH ONCE DAILY. 30 tablet 0   busPIRone (BUSPAR) 10 MG tablet TAKE ONE TABLET BY MOUTH 3 TIMES A DAY 90 tablet 5   Calcium Carb-Cholecalciferol (CALCIUM 600/VITAMIN D) 600-400 MG-UNIT CHEW Chew 1 each by mouth daily.     Cholecalciferol (VITAMIN D3) 125 MCG (5000 UT) CAPS Take 5,000 Units by mouth daily.     clonazePAM (KLONOPIN) 0.5 MG tablet TAKE 1 TABLET BY MOUTH AT BEDTIME. 30 tablet 2   docusate sodium (COLACE) 100 MG capsule Take 200 mg by mouth daily  as needed for moderate constipation.     folic acid (FOLVITE) 1 MG tablet TAKE ONE TABLET BY MOUTH ONCE DAILY. 90 tablet 3   Misc. Devices Optometrist) MISC For transporting to and from Doctors appointments 1 each 0   montelukast (SINGULAIR) 10 MG tablet TAKE ONE TABLET BY MOUTH AT BEDTIME 90 tablet 3   nitroGLYCERIN (NITROSTAT) 0.4 MG SL tablet Place 1 tablet (0.4 mg total) under the tongue every 5 (five) minutes as needed for chest pain. 25 tablet 2   oxyCODONE (OXY IR/ROXICODONE) 5 MG immediate release tablet Take 1 tablet (5 mg total) by mouth 4 (four) times daily as needed for moderate pain. 120 tablet 0   oxyCODONE ER (XTAMPZA ER) 13.5 MG C12A Take 1 capsule by mouth every 12 (twelve) hours. 60 capsule 0   OXYGEN Inhale 3 L into the lungs continuous.     pantoprazole (PROTONIX) 40 MG tablet TAKE ONE TABLET BY MOUTH ONCE DAILY. 90 tablet 0   polyethylene glycol (MIRALAX / GLYCOLAX) 17 g packet Take 17 g by mouth 2 (two) times daily. 14 each 0   QUEtiapine (SEROQUEL) 300 MG tablet TAKE (1/2) A TABLET BY MOUTH TWICE A DAY, AND (1) TABLET AT BEDTIME. 60 tablet 3   rosuvastatin (CRESTOR) 5 MG tablet TAKE ONE TABLET BY MOUTH ONCE DAILY 90 tablet 0   senna-docusate (SENOKOT-S) 8.6-50 MG tablet Take 1 tablet by mouth at bedtime as needed for moderate constipation. 30 tablet 1   tamsulosin (FLOMAX) 0.4 MG CAPS capsule TAKE (1) CAPSULE BY MOUTH TWICE DAILY. (Patient taking differently: Take 0.4 mg by mouth 2 (two) times daily.) 180 capsule 1   terazosin (HYTRIN) 5 MG capsule Take 1 capsule (5 mg total) by mouth daily. 30 capsule 5   UNABLE TO FIND Med Name: Bedside Table 1 each 0   No facility-administered medications prior to visit.  Allergies  Allergen Reactions   Ciprofloxacin Hives    ROS Review of Systems  Constitutional:  Negative for chills and fever.  HENT:  Negative for congestion and sore throat.   Eyes:  Negative for pain and discharge.  Respiratory:  Negative for cough  and shortness of breath.   Cardiovascular:  Negative for chest pain and palpitations.  Gastrointestinal:  Positive for abdominal pain, constipation and nausea. Negative for diarrhea and vomiting.  Endocrine: Negative for polydipsia and polyuria.  Genitourinary:  Positive for difficulty urinating. Negative for dysuria and hematuria.  Musculoskeletal:  Positive for arthralgias, back pain, gait problem, myalgias and neck pain.  Skin:  Negative for rash.  Neurological:  Negative for dizziness, weakness, numbness and headaches.  Psychiatric/Behavioral:  Positive for decreased concentration, dysphoric mood and sleep disturbance. The patient is nervous/anxious.       Objective:    Physical Exam Vitals reviewed.  Constitutional:      General: He is not in acute distress.    Appearance: He is not diaphoretic.  HENT:     Head: Normocephalic and atraumatic.     Nose: Nose normal.     Mouth/Throat:     Mouth: Mucous membranes are moist.  Eyes:     General: No scleral icterus.    Extraocular Movements: Extraocular movements intact.  Cardiovascular:     Rate and Rhythm: Normal rate and regular rhythm.     Pulses: Normal pulses.     Heart sounds: Normal heart sounds. No murmur heard. Pulmonary:     Breath sounds: Normal breath sounds. No wheezing or rales.     Comments: Tracheostomy in place Abdominal:     Palpations: Abdomen is soft.     Tenderness: There is no abdominal tenderness.  Musculoskeletal:     Cervical back: Neck supple. No tenderness.     Right lower leg: No edema.     Left lower leg: No edema.  Skin:    General: Skin is warm.     Findings: No rash.  Neurological:     General: No focal deficit present.     Mental Status: He is alert and oriented to person, place, and time.     Sensory: No sensory deficit.     Motor: No weakness.  Psychiatric:        Mood and Affect: Mood normal.        Behavior: Behavior normal.     BP 122/76   Pulse 96   Ht '5\' 8"'  (1.727 m)    Wt 189 lb (85.7 kg)   SpO2 96% Comment: 02 set to 2  BMI 28.74 kg/m  Wt Readings from Last 3 Encounters:  12/04/21 189 lb (85.7 kg)  12/03/21 191 lb 3.2 oz (86.7 kg)  11/24/21 189 lb 2.5 oz (85.8 kg)    No results found for: "TSH" Lab Results  Component Value Date   WBC 7.7 11/27/2021   HGB 12.5 (L) 11/27/2021   HCT 37.1 (L) 11/27/2021   MCV 89.0 11/27/2021   PLT 238 11/27/2021   Lab Results  Component Value Date   NA 140 11/28/2021   K 3.5 11/28/2021   CO2 28 11/28/2021   GLUCOSE 140 (H) 11/28/2021   BUN 7 11/28/2021   CREATININE 0.84 11/28/2021   BILITOT 0.5 11/22/2021   ALKPHOS 116 11/22/2021   AST 20 11/22/2021   ALT 21 11/22/2021   PROT 6.8 11/22/2021   ALBUMIN 3.0 (L) 11/24/2021   CALCIUM 8.2 (L) 11/28/2021   ANIONGAP  4 (L) 11/28/2021   Lab Results  Component Value Date   CHOL 130 04/23/2021   Lab Results  Component Value Date   HDL 53 04/23/2021   Lab Results  Component Value Date   LDLCALC 50 04/23/2021   Lab Results  Component Value Date   TRIG 135 04/23/2021   Lab Results  Component Value Date   CHOLHDL 2.5 04/23/2021   Lab Results  Component Value Date   HGBA1C 6.3 (H) 11/22/2021      Assessment & Plan:   Problem List Items Addressed This Visit       Respiratory   Chronic respiratory failure with hypoxia ([place])    Due to COPD Home O2 PRN Well-controlled with Symbicort and PRN Albuterol F/u with Pulmonology        Digestive   Colitis - Primary    Recent hospitalization for sepsis secondary to colitis S/p Rocephin and Flagyl Still has GI symptoms and low-grade fever Started Augmentin Check CBC      Relevant Medications   amoxicillin-clavulanate (AUGMENTIN) 875-125 MG tablet   ondansetron (ZOFRAN) 4 MG tablet   Other Relevant Orders   CBC with Differential/Platelet     Other   Physical deconditioning    Due to prolonged bedridden position from fall leading to thoracic spine fracture Also has chronic respiratory  failure from COPD Has tracheostomy for subglottic stenosis Has hospital bed for better position change in the bed and light weight wheelchair for better mobility      Chronic pain    On chronic opioid medications Followed by PM&R      Hospital discharge follow-up    Hospital chart reviewed, including discharge summary Check CBC and CMP Medications reconciled and reviewed with the patient       Relevant Orders   CBC with Differential/Platelet   CMP14+EGFR   Other Visit Diagnoses     Hypokalemia       Relevant Orders   CMP14+EGFR   Need for hepatitis C screening test       Relevant Orders   Hepatitis C Antibody       Meds ordered this encounter  Medications   amoxicillin-clavulanate (AUGMENTIN) 875-125 MG tablet    Sig: Take 1 tablet by mouth 2 (two) times daily.    Dispense:  14 tablet    Refill:  0   ondansetron (ZOFRAN) 4 MG tablet    Sig: Take 1 tablet (4 mg total) by mouth every 8 (eight) hours as needed for nausea or vomiting.    Dispense:  20 tablet    Refill:  0    Follow-up: Return in about 4 months (around 04/06/2022), or if symptoms worsen or fail to improve, for Annual physical.    Lindell Spar, MD

## 2021-12-04 NOTE — Assessment & Plan Note (Signed)
Recent hospitalization for sepsis secondary to colitis S/p Rocephin and Flagyl Still has GI symptoms and low-grade fever Started Augmentin Check CBC

## 2021-12-04 NOTE — Assessment & Plan Note (Signed)
Due to COPD Home O2 PRN Well-controlled with Symbicort and PRN Albuterol F/u with Pulmonology

## 2021-12-04 NOTE — Patient Instructions (Signed)
Please take Augmentin as prescribed.  Please take Zofran as needed for nausea.  Please follow low carb diet and ambulate as tolerated.  Please follow up with GI for colonoscopy.

## 2021-12-04 NOTE — Assessment & Plan Note (Signed)
Due to prolonged bedridden position from fall leading to thoracic spine fracture Also has chronic respiratory failure from COPD Has tracheostomy for subglottic stenosis Has hospital bed for better position change in the bed and light weight wheelchair for better mobility

## 2021-12-04 NOTE — Assessment & Plan Note (Signed)
Hospital chart reviewed, including discharge summary Check CBC and CMP Medications reconciled and reviewed with the patient

## 2021-12-05 ENCOUNTER — Other Ambulatory Visit: Payer: Self-pay | Admitting: Internal Medicine

## 2021-12-05 DIAGNOSIS — K529 Noninfective gastroenteritis and colitis, unspecified: Secondary | ICD-10-CM

## 2021-12-05 DIAGNOSIS — D72829 Elevated white blood cell count, unspecified: Secondary | ICD-10-CM

## 2021-12-05 LAB — CBC WITH DIFFERENTIAL/PLATELET
Basophils Absolute: 0.1 10*3/uL (ref 0.0–0.2)
Basos: 1 %
EOS (ABSOLUTE): 1.3 10*3/uL — ABNORMAL HIGH (ref 0.0–0.4)
Eos: 11 %
Hematocrit: 43.4 % (ref 37.5–51.0)
Hemoglobin: 14.4 g/dL (ref 13.0–17.7)
Immature Grans (Abs): 0.1 10*3/uL (ref 0.0–0.1)
Immature Granulocytes: 1 %
Lymphocytes Absolute: 2 10*3/uL (ref 0.7–3.1)
Lymphs: 17 %
MCH: 29.9 pg (ref 26.6–33.0)
MCHC: 33.2 g/dL (ref 31.5–35.7)
MCV: 90 fL (ref 79–97)
Monocytes Absolute: 0.8 10*3/uL (ref 0.1–0.9)
Monocytes: 7 %
Neutrophils Absolute: 7.7 10*3/uL — ABNORMAL HIGH (ref 1.4–7.0)
Neutrophils: 63 %
Platelets: 358 10*3/uL (ref 150–450)
RBC: 4.82 x10E6/uL (ref 4.14–5.80)
RDW: 13.1 % (ref 11.6–15.4)
WBC: 12 10*3/uL — ABNORMAL HIGH (ref 3.4–10.8)

## 2021-12-05 LAB — CMP14+EGFR
ALT: 11 IU/L (ref 0–44)
AST: 12 IU/L (ref 0–40)
Albumin/Globulin Ratio: 1.8 (ref 1.2–2.2)
Albumin: 4.4 g/dL (ref 3.8–4.9)
Alkaline Phosphatase: 121 IU/L (ref 44–121)
BUN/Creatinine Ratio: 11 (ref 9–20)
BUN: 10 mg/dL (ref 6–24)
Bilirubin Total: 0.2 mg/dL (ref 0.0–1.2)
CO2: 28 mmol/L (ref 20–29)
Calcium: 9.5 mg/dL (ref 8.7–10.2)
Chloride: 101 mmol/L (ref 96–106)
Creatinine, Ser: 0.9 mg/dL (ref 0.76–1.27)
Globulin, Total: 2.5 g/dL (ref 1.5–4.5)
Glucose: 135 mg/dL — ABNORMAL HIGH (ref 70–99)
Potassium: 4.7 mmol/L (ref 3.5–5.2)
Sodium: 143 mmol/L (ref 134–144)
Total Protein: 6.9 g/dL (ref 6.0–8.5)
eGFR: 100 mL/min/{1.73_m2} (ref >59)

## 2021-12-05 LAB — URINE CULTURE: Culture: <10000 — AB

## 2021-12-05 LAB — HEPATITIS C ANTIBODY: Hep C Virus Ab: NONREACTIVE

## 2021-12-09 ENCOUNTER — Telehealth: Payer: Self-pay | Admitting: *Deleted

## 2021-12-09 DIAGNOSIS — S22000G Wedge compression fracture of unspecified thoracic vertebra, subsequent encounter for fracture with delayed healing: Secondary | ICD-10-CM

## 2021-12-09 DIAGNOSIS — G894 Chronic pain syndrome: Secondary | ICD-10-CM

## 2021-12-09 MED ORDER — OXYCODONE HCL 5 MG PO TABS
5.0000 mg | ORAL_TABLET | Freq: Four times a day (QID) | ORAL | 0 refills | Status: DC | PRN
Start: 2021-12-09 — End: 2021-12-25

## 2021-12-09 MED ORDER — XTAMPZA ER 13.5 MG PO C12A: 1.0000 | EXTENDED_RELEASE_CAPSULE | Freq: Two times a day (BID) | ORAL | 0 refills | Status: DC

## 2021-12-09 NOTE — Telephone Encounter (Signed)
Rx written and sent to the pharmacy. Thanks!  

## 2021-12-09 NOTE — Telephone Encounter (Signed)
Prior auth submitted to Medicaid through [facility] Confirmation #[id number]

## 2021-12-09 NOTE — Telephone Encounter (Signed)
Requesting refills on oxycodone and xtampza.  Appt is not until 12/25/21.  Last fill dates were  11/12/2021 Xtampza Er 13.5 Mg Capsule #60.00  11/01/2021 Oxycodone Hcl (Ir) 5 Mg Tablet #120.00

## 2021-12-10 ENCOUNTER — Other Ambulatory Visit: Payer: Self-pay | Admitting: Internal Medicine

## 2021-12-10 NOTE — Telephone Encounter (Signed)
Status:APPROVED Effective Begin Date:12/09/2021 Effective End Date:06/07/2022

## 2021-12-11 ENCOUNTER — Ambulatory Visit: Payer: Medicaid Other | Admitting: Internal Medicine

## 2021-12-11 ENCOUNTER — Other Ambulatory Visit: Payer: Self-pay | Admitting: Internal Medicine

## 2021-12-12 ENCOUNTER — Ambulatory Visit: Payer: Medicaid Other | Admitting: Internal Medicine

## 2021-12-18 ENCOUNTER — Encounter: Payer: Self-pay | Admitting: Internal Medicine

## 2021-12-18 ENCOUNTER — Ambulatory Visit (INDEPENDENT_AMBULATORY_CARE_PROVIDER_SITE_OTHER): Payer: Medicaid Other | Admitting: Internal Medicine

## 2021-12-18 DIAGNOSIS — J9611 Chronic respiratory failure with hypoxia: Secondary | ICD-10-CM

## 2021-12-18 DIAGNOSIS — J449 Chronic obstructive pulmonary disease, unspecified: Secondary | ICD-10-CM

## 2021-12-18 MED ORDER — BUDESONIDE 0.25 MG/2ML IN SUSP: RESPIRATORY_TRACT | 12 refills | Status: DC

## 2021-12-18 MED ORDER — FORMOTEROL FUMARATE 20 MCG/2ML IN NEBU: 20.0000 ug | INHALATION_SOLUTION | Freq: Two times a day (BID) | RESPIRATORY_TRACT | 11 refills | Status: DC

## 2021-12-18 NOTE — Progress Notes (Unsigned)
Charles Weeks, male    DOB: [birth year],    MRN: [id number]   Brief patient profile:  72  yowm illiterate though says finished 7th grade with asthma since in his 9s  just MM/quit smoking 08/2020 on disability since last PFT's ? 2017/18 relocated to [facility] summer 2021 and referred to pulmonary clinic in [place]  01/24/2020 by Bonna Gains NP.  Admit date: 11/04/2019 Discharge date: 11/10/2019   Admitted From: Home Disposition: Home   Recommendations for Outpatient Follow-up:  Follow up with PCP in 1-2 weeks Please obtain BMP/CBC in one week   Home Health: Equipment/Devices: Home oxygen at 3 L, nebulizer machine   Discharge Condition: Stable CODE STATUS: Full code Diet recommendation: Heart healthy   Brief/Interim Summary: Charles Weeks is a 56 y.o. male with a history of CAD, MI, COPD.  Patient just recently moved to [place] from [place].  He has no medications and has not seen a physician here.  He started having shortness of breath about 3 weeks ago which has been increasing.  He has been having cough with some purulent sputum production.  He had a relative give him some steroids over the past few days, without too much improvement.  No other palliating or provoking factors.  Denies fevers, chills, nausea, vomiting.   Emergency Department Course: Patient started on BiPAP and gradually weaned to nasal cannula.  Initial saturations were in the 80s.  Improved with steroids and BiPAP.  Remained stable on nasal cannula.   Discharge Diagnoses:  Principal Problem:   Acute respiratory failure with hypoxia ([place]) Active Problems:   Coronary artery disease   COPD with acute exacerbation (HCC)   Acute on chronic hypoxic respiratory failure secondary to acute COPD exacerbation Presented with hypoxia with O2 saturation in the 80s initially requiring NIV then transitioned to 3-4 L  -previously on 3L at home but has not used since Oct 2020 -Continue brovana and  pulmicort -Continue hypertonic saline nebs -continue duonebs -He was treated with IV steroids which have since been transitioned to prednisone taper -Personally reviewed chest x-ray which showed no lobular pulmonary infiltrates to suggest pneumonia -Maintain O2 saturation greater than 88 to 90% -TOC consulted to assist with PCP and pulmonology referrals -Currently, he can ambulate comfortably on 3 L of oxygen which appears to be near his baseline   -Acute COPD exacerbation Ongoing tobacco use; tobacco cessation counseling at bedside. COVID-19 negative Treated with IV steroids which was subsequently transitioned to prednisone taper Continue management as stated above -Continue Symbicort on discharge -Continue albuterol nebs as needed, provided with nebulizer machine   Hyperglycemia/impaired glucose tolerance Presented with elevated serum glucose Hemoglobin A1c 6.3 on 619 1221 Monitored on sliding scale insulin   Coronary artery disease status post PCI/history of MI Denies any anginal symptoms at the time of this exam Continue Brilinta  Not on statin, obtain lipid panel   Tobacco use disorder Tobacco cessation counseling Nicotine patch   BPH Continue terazosin Monitor urine output    History of Present Illness  01/24/2020  Pulmonary/ 1st office eval/ Jasemine Nawaz / [place] [facility]  Chief Complaint  Patient presents with   Pulmonary Consult    Referred by Cherly Beach, NP.  Pt states hx of COPD. Pt c/o increased SOB since Winter 2020. He sometimes gets winded walking room to room at home. He is using albuterol inhaler and neb both 1-2 x per day.   Dyspnea:  [employer] walking / walk from [facility] space (sisters) / no steps but  there are times he can't get across the room s sob  Cough: better since quit  Sleep: flat bed/ 1 pillow  - no resp  SABA use:neb  once or twice daily "can't make it thru the day" and also using 2 different forms of hfa  02  Prn  rec Plan A = Automatic = Always=     Symbicort 160 Take 2 puffs first thing in am and then another 2 puffs about 12 hours later.  Work on inhaler technique: Plan B = Backup (to supplement plan A, not to replace it) Only use your albuterol inhaler as a rescue medication Plan C = Crisis (instead of Plan B but only if Plan B stops working) - only use your albuterol nebulizer if you first try Plan B and it fails to help  Make sure you check your oxygen saturations at highest level of activity  Please schedule a follow up visit in 3 months with pfts on return      05/31/2020  f/u ov/[place] office/Rashee Marschall re: needs to go to lab / did stop smoking Chief Complaint  Patient presents with   Follow-up    Patient feels like breathing is the same since last visit. Cough but states that it is hard to get up mucus.   Dyspnea: basically housebound/ walks dog to porch / 25 ft bd Cough: non productive, last cig jun 2021 much better since then  Sleeping: flat bed/ no bipap/ wakes up feeling find SABA use: rarely neb,  Twice a daily proair never pre challenges or rechallenges  02: has it not using / not tracking sats with ex  Rec Plan A = Automatic = Always=    Symbicort 160 Take 2 puffs first thing in am and then another 2 puffs about 12 hours later.  Work on inhaler technique:     Plan B = Backup (to supplement plan A, not to replace it) Only use your albuterol inhaler as a rescue medication  Plan C = Crisis (instead of Plan B but only if Plan B stops working) - only use your albuterol nebulizer if you first try Plan B and it fails to help > ok to use the nebulizer up to every 4 hours but if start needing it regularly call for immediate appointment Make sure you check your oxygen saturations at highest level of activity to be sure it stays over 90%  Please remember to go to the lab and x-ray department at [facility]   for your tests - we will call you with the results when they are available. Please schedule a follow up visit in 3  months with pfts on return    Admitted August 17 2020 to [facility] > d/c home with trach and PEG and f/u by Dr Jenne Pane who rec laser surgery on trach     12/24/2020  f/u ov/[place] office/Margart Zemanek re: GOLD IV post hosp  Chief Complaint  Patient presents with   Follow-up    Patient states that he would rather have Symbicort over [facility] inhaler feels like it does not work as good. Patient was admitted on 10/09/20. Sister states he will need surgical clearance today for his ENT Dr. Jenne Pane   Dyspnea:  mb and back 100 ft flat s 02  Cough: clear phlegm  Sleeping: bed is flat / on back one pillow SABA use: neb twice daily  02: 4lpm  Covid status: 2 vax  Rec Ok to change dulera to symbicort 160  Take 2 puffs first thing in am and then another 2 puffs about 12 hours later.  You are cleared for surgery > [facility] Baseman  02/06/21 balloon dilatation/ no laser      06/19/2021  f/u ov/[place] office/Kayn Haymore re: trach dep s/p multiple trauma  maint on symbicort 160 but also taking dulera 200   Chief Complaint  Patient presents with   Follow-up    Patient had a lot of mucus with prednisone, so stopped taking.   Dyspnea:  sits on bed most days extremely sedentary so not really limited by sob   Cough: less mucus production now/ min mucoid  Sleeping: on flat bed on either back or  side s resp cc  SABA use: not using correctly  02: 1-4lpm to keep sats > 90%  Covid status: vax x 3  Rec Stop dulera  Plan A = Automatic = Always=    symbicort 160 Take 2 puffs first thing in am and then another 2 puffs about 12 hours later.  Plan B = Backup (to supplement plan A, not to replace it) Only use your albuterol inhaler as a rescue medication  Plan C = Crisis (instead of Plan B but only if Plan B stops working) - only use your albuterol nebulizer if you first try Plan B   Please schedule a follow up visit in 6 months but call sooner if needed     12/18/2021  f/u ov/[place] office/Michiko Lineman re: trach dep s/p multiple  traua maint on trach collar 3lpm  and symb 160 2bid  Chief Complaint  Patient presents with   Follow-up    Breathing is about the same    Dyspnea:  very sedentary / most the day sits on side of bed  Cough: clear mucus mod thick white Sleeping: hosp bed head up just a little  SABA use: neb qid / last neb 4 h  02: 3lpm    No obvious day to day or daytime variability or assoc excess/ purulent sputum or mucus plugs or hemoptysis or cp or chest tightness, subjective wheeze or overt sinus or hb symptoms.     Also denies any obvious fluctuation of symptoms with weather or environmental changes or other aggravating or alleviating factors except as outlined above   No unusual exposure hx or h/o childhood pna/ asthma or knowledge of premature birth.  Current Allergies, Complete Past Medical History, Past Surgical History, Family History, and Social History were reviewed in [employer] record.  ROS  The following are not active complaints unless bolded Hoarseness, sore throat, dysphagia, dental problems, itching, sneezing,  nasal congestion or discharge of excess mucus or purulent secretions, ear ache,   fever, chills, sweats, unintended wt loss or wt gain, classically pleuritic or exertional cp,  orthopnea pnd or arm/hand swelling  or leg swelling, presyncope, palpitations, abdominal pain, anorexia, nausea, vomiting, diarrhea  or change in bowel habits or change in bladder habits, change in stools or change in urine, dysuria, hematuria,  rash, arthralgias, visual complaints, headache, numbness, weakness or ataxia or problems with walking or coordination,  change in mood or  memory.        Current Meds  Medication Sig   acetaminophen (TYLENOL) 325 MG tablet Take 2 tablets (650 mg total) by mouth every 6 (six) hours as needed for mild pain or moderate pain.   albuterol (PROAIR HFA) 108 (90 Base) MCG/ACT inhaler Inhale 1-2 puffs into the lungs every 4 (four) hours as needed for  wheezing or  shortness of breath. (Patient taking differently: Inhale 2 puffs into the lungs 2 (two) times daily as needed for wheezing or shortness of breath.)   albuterol (PROVENTIL) (2.5 MG/3ML) 0.083% nebulizer solution INHALE (1) VIAL VIA NEBULIZATION EVERY SIX HOURS AS NEEDED FOR WHEEZING OR SHORTNESS OF BREATH.   aspirin 81 MG chewable tablet Chew 1 tablet (81 mg total) by mouth daily.   bethanechol (URECHOLINE) 25 MG tablet TAKE TWO TABLETS BY MOUTH FOUR TIMES DAILY   budesonide-formoterol (SYMBICORT) 160-4.5 MCG/ACT inhaler Take 2 puffs first thing in am and then another 2 puffs about 12 hours later. (Patient taking differently: Inhale 2 puffs into the lungs in the morning and at bedtime. Take 2 puffs first thing in am and then another 2 puffs about 12 hours later.)   buPROPion (WELLBUTRIN XL) 300 MG 24 hr tablet TAKE 1 TABLET BY MOUTH ONCE DAILY.   busPIRone (BUSPAR) 10 MG tablet TAKE ONE TABLET BY MOUTH 3 TIMES A DAY   Calcium Carb-Cholecalciferol (CALCIUM 600/VITAMIN D) 600-400 MG-UNIT CHEW Chew 1 each by mouth daily.   Cholecalciferol (VITAMIN D3) 125 MCG (5000 UT) CAPS Take 5,000 Units by mouth daily.   clonazePAM (KLONOPIN) 0.5 MG tablet TAKE 1 TABLET BY MOUTH AT BEDTIME.   docusate sodium (COLACE) 100 MG capsule Take 200 mg by mouth daily as needed for moderate constipation.   folic acid (FOLVITE) 1 MG tablet TAKE ONE TABLET BY MOUTH ONCE DAILY.   Misc. Devices Theme park manager) MISC For transporting to and from Doctors appointments   montelukast (SINGULAIR) 10 MG tablet TAKE ONE TABLET BY MOUTH AT BEDTIME   nitroGLYCERIN (NITROSTAT) 0.4 MG SL tablet Place 1 tablet (0.4 mg total) under the tongue every 5 (five) minutes as needed for chest pain.   ondansetron (ZOFRAN) 4 MG tablet Take 1 tablet (4 mg total) by mouth every 8 (eight) hours as needed for nausea or vomiting.   oxyCODONE (OXY IR/ROXICODONE) 5 MG immediate release tablet Take 1 tablet (5 mg total) by mouth 4 (four) times daily  as needed for moderate pain.   oxyCODONE ER (XTAMPZA ER) 13.5 MG C12A Take 1 capsule by mouth every 12 (twelve) hours.   OXYGEN Inhale 3 L into the lungs continuous.   pantoprazole (PROTONIX) 40 MG tablet TAKE ONE TABLET BY MOUTH ONCE DAILY.   polyethylene glycol (MIRALAX / GLYCOLAX) 17 g packet Take 17 g by mouth 2 (two) times daily.   QUEtiapine (SEROQUEL) 300 MG tablet TAKE (1/2) A TABLET BY MOUTH TWICE A DAY, AND (1) TABLET AT BEDTIME.   rosuvastatin (CRESTOR) 5 MG tablet TAKE ONE TABLET BY MOUTH ONCE DAILY   senna-docusate (SENOKOT-S) 8.6-50 MG tablet Take 1 tablet by mouth at bedtime as needed for moderate constipation.   tamsulosin (FLOMAX) 0.4 MG CAPS capsule TAKE (1) CAPSULE BY MOUTH TWICE DAILY.   terazosin (HYTRIN) 5 MG capsule Take 1 capsule (5 mg total) by mouth daily.   UNABLE TO FIND Med Name: Bedside Table                   Past Medical History:  Diagnosis Date   Acute respiratory failure with hypoxia (HCC) 11/04/2019   Anxiety    Arthritis    COPD (chronic obstructive pulmonary disease) (HCC)    COPD with acute exacerbation (HCC) 11/04/2019   Coronary artery disease    Depression    Emphysema of lung (HCC)    Hypertension    Myocardial infarct (HCC)    Oxygen deficiency  Objective:   wts  12/18/2021        194  06/19/2021        182 12/24/2020        181  05/31/20 194 lb 12.8 oz (88.4 kg)  03/06/20 186 lb (84.4 kg)  01/27/20 186 lb (84.4 kg)      Vital signs reviewed  12/18/2021  - Note at rest 02 sats  95% on 3lpm Trach collar   General appearance:   chronically ill wm > stated age   25 : Oropharynx  clear   Nasal turbinates nl / trach in place clean   NECK :  without  apparent JVD/ palpable Nodes/TM    LUNGS: no acc muscle use,  Mild barrel  contour chest wall with bilateral  Distant bs s audible wheeze and  without cough on insp or exp maneuvers  and mild  Hyperresonant  to  percussion bilaterally     CV:  RRR  no s3 or murmur or increase  in P2, and no edema   ABD:  soft and nontender with pos end  insp Hoover's  in the supine position.  No bruits or organomegaly appreciated   MS:  Nl gait/ ext warm without deformities Or obvious joint restrictions  calf tenderness, cyanosis or clubbing     SKIN: warm and dry without lesions    NEURO:  alert, approp, nl sensorium with  no motor or cerebellar deficits apparent.    12/18/2021  cxr:  did not go to radiology as rec     Assessment

## 2021-12-18 NOTE — Patient Instructions (Addendum)
Make sure you check your oxygen saturation  AT  your highest level of activity (not after you stop)   to be sure it stays over 90% and adjust  02 flow upward to maintain this level if needed but remember to turn it back to previous settings when you stop (to conserve your supply).   For cough/ congestion > mucinex 1200 mg every 12 hours as needed   Try substitute the neb for the symbicort =  budesonide / performist twice daily then just use albuterol in between if you really need it.    Please remember to go to the  x-ray department  @  [facility] for your tests - we will call you with the results when they are available      Please schedule a follow up visit in 3 months but call sooner if needed

## 2021-12-19 ENCOUNTER — Other Ambulatory Visit: Payer: Self-pay

## 2021-12-19 ENCOUNTER — Encounter: Payer: Self-pay | Admitting: Internal Medicine

## 2021-12-19 ENCOUNTER — Other Ambulatory Visit: Payer: Self-pay | Admitting: Physical Medicine & Rehabilitation

## 2021-12-19 ENCOUNTER — Other Ambulatory Visit: Payer: Self-pay | Admitting: Internal Medicine

## 2021-12-19 DIAGNOSIS — J449 Chronic obstructive pulmonary disease, unspecified: Secondary | ICD-10-CM

## 2021-12-19 DIAGNOSIS — N401 Enlarged prostate with lower urinary tract symptoms: Secondary | ICD-10-CM

## 2021-12-19 MED ORDER — BUDESONIDE 0.25 MG/2ML IN SUSP: RESPIRATORY_TRACT | 12 refills | Status: DC

## 2021-12-19 MED ORDER — FORMOTEROL FUMARATE 20 MCG/2ML IN NEBU: 20.0000 ug | INHALATION_SOLUTION | Freq: Two times a day (BID) | RESPIRATORY_TRACT | 11 refills | Status: DC

## 2021-12-19 NOTE — Assessment & Plan Note (Signed)
Quit smoking 08/2020/ MM - 01/24/2020  After extensive coaching inhaler device,  effectiveness =    75% (short Ti) > change symb 80 to 160 2bid  -  PFT's   05/01/20   FEV1 0.97 (27 % ) ratio 0.30  p 1 % improvement from saba p symbicort prior to study with DLCO  11.59 (43%) corrects to 2.0 (45%)  for alv volume and FV curve severe airflow obst with 35% improvement in FVC p saba  - 05/31/2020  After extensive coaching inhaler device,  effectiveness =    Short Ti, prostate problems so rx symbicort 160 2bid and prn saba -  05/31/2020   Walked RA  approx  300 ft  @ moderate pace  stopped due to  Sob at end but sats still 94%    - Labs ordered 12/24/2020  :    alpha one AT phenotype  MM level 154   Add mucinex /humidity for thick mucus and change to perfomist/bud if insurance will cover so he is not so saba neb dependent.

## 2021-12-19 NOTE — Assessment & Plan Note (Signed)
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 Patient wanted to stop walk after150 e due to SOB and being unsteady with lowest sats 94%    Each maintenance medication was reviewed in detail including emphasizing most importantly the difference between maintenance and prns and under what circumstances the prns are to be triggered using an action plan format where appropriate.  Total time for H and P, chart review, counseling, reviewing hfa/neb/02 device(s) , directly observing portions of ambulatory 02 saturation study/ and generating customized AVS unique to this office visit / same day charting > 30 min for multiple  refractory respiratory  symptoms of uncertain etiology

## 2021-12-25 ENCOUNTER — Encounter: Payer: Medicaid Other | Attending: Physical Medicine & Rehabilitation | Admitting: Physical Medicine & Rehabilitation

## 2021-12-25 ENCOUNTER — Ambulatory Visit (HOSPITAL_COMMUNITY)
Admission: RE | Admit: 2021-12-25 | Discharge: 2021-12-25 | Disposition: A | Discharge status: Home / Self Care | Payer: Medicaid Other | Source: Ambulatory Visit | Attending: Internal Medicine | Admitting: Internal Medicine

## 2021-12-25 ENCOUNTER — Encounter: Payer: Self-pay | Admitting: Physical Medicine & Rehabilitation

## 2021-12-25 VITALS — BP 122/87 | HR 87 | Ht 68.0 in | Wt 196.0 lb

## 2021-12-25 DIAGNOSIS — S22008S Other fracture of unspecified thoracic vertebra, sequela: Secondary | ICD-10-CM | POA: Insufficient documentation

## 2021-12-25 DIAGNOSIS — J449 Chronic obstructive pulmonary disease, unspecified: Secondary | ICD-10-CM | POA: Insufficient documentation

## 2021-12-25 DIAGNOSIS — Z930 Tracheostomy status: Secondary | ICD-10-CM | POA: Diagnosis present

## 2021-12-25 DIAGNOSIS — S22000G Wedge compression fracture of unspecified thoracic vertebra, subsequent encounter for fracture with delayed healing: Secondary | ICD-10-CM | POA: Insufficient documentation

## 2021-12-25 DIAGNOSIS — Z5181 Encounter for therapeutic drug level monitoring: Secondary | ICD-10-CM | POA: Diagnosis present

## 2021-12-25 DIAGNOSIS — Z79891 Long term (current) use of opiate analgesic: Secondary | ICD-10-CM | POA: Diagnosis present

## 2021-12-25 DIAGNOSIS — G894 Chronic pain syndrome: Secondary | ICD-10-CM | POA: Insufficient documentation

## 2021-12-25 MED ORDER — XTAMPZA ER 13.5 MG PO C12A: 1.0000 | EXTENDED_RELEASE_CAPSULE | Freq: Two times a day (BID) | ORAL | 0 refills | Status: DC

## 2021-12-25 MED ORDER — OXYCODONE HCL 5 MG PO TABS: 5.0000 mg | ORAL_TABLET | Freq: Four times a day (QID) | ORAL | 0 refills | Status: DC | PRN

## 2021-12-25 NOTE — Patient Instructions (Signed)
SENOKOT-S TWO AT BEDTIME

## 2021-12-25 NOTE — Progress Notes (Signed)
Subjective:    Patient ID: Charles Weeks, male    DOB: 18-Nov-1965, 56 y.o.   MRN: [id number]  HPI  Charles Weeks is back regarding his chronic pain. He was in the hospital in July due to a colitis and ileus. He had an NGT for a period and was on iv abx and eventually orals. He has been feeling better after that scare. His breathing is near baseline. He maintain follow up with Charles Weeks from a pulmonary standpoint.   His Xtampza and oxycodone seem to be controlling his pain.  He does have ongoing back pain and and mentions that little pain around his prior PEG site today.  He does try to move a little bit at home but his ambulation he really is limited to household distances and trips to the mailbox.  He does have a [place] that he wants to start flying when the weather permits.  Bowel movements have been fairly regular.  He is using Senokot S as well as Colace and Metamucil every other day.  Goal is for a bowel movement every day although this does not always happen.    Pain Inventory Average Pain 8 Pain Right Now 7 My pain is sharp and aching  In the last 24 hours, has pain interfered with the following? General activity 7 Relation with others 5 Enjoyment of life 7 What TIME of day is your pain at its worst? morning  and evening Sleep (in general) Fair  Pain is worse with: walking, bending, and standing Pain improves with: rest and medication Relief from Meds: 6  Family History  Problem Relation Age of Onset   Heart disease Mother    Heart disease Father    Diabetes Sister    Social History   Socioeconomic History   Marital status: Single    Spouse name: Not on file   Number of children: 2   Years of education: Not on file   Highest education level: 7th grade  Occupational History   Not on file  Tobacco Use   Smoking status: Former    Packs/day: 1.50    Types: Cigarettes    Quit date: 08/17/2020    Years since quitting: 1.3   Smokeless tobacco: Never  Vaping Use    Vaping Use: Never used  Substance and Sexual Activity   Alcohol use: Not Currently   Drug use: Not Currently   Sexual activity: Not on file  Other Topics Concern   Not on file  Social History Narrative   ** Merged History Encounter **       Lives with sister  Both daughters live close by   Sister has dog   Enjoys: shooting pool, fishing, Therapist, nutritional  Diet: eats all food groups Caffeine: pop-dr pepper 2 liter  Water: 4-6 cups   Wears seat belt  No driving for him Psychologist, sport and exercise  Fi   re Extinguisher No weapons        Social Determinants of Health   Financial Resource Strain: Low Risk  (12/08/2019)   Overall Financial Resource Strain (CARDIA)    Difficulty of Paying Living Expenses: Not hard at all  Food Insecurity: No Food Insecurity (12/08/2019)   Hunger Vital Sign    Worried About Running Out of Food in the Last Year: Never true    Ran Out of Food in the Last Year: Never true  Transportation Needs: No Transportation Needs (12/08/2019)   PRAPARE - Administrator, Civil Service (Medical): No  Lack of Transportation (Non-Medical): No  Physical Activity: Inactive (12/08/2019)   Exercise Vital Sign    Days of Exercise per Week: 0 days    Minutes of Exercise per Session: 0 min  Stress: No Stress Concern Present (12/08/2019)   [place]    Feeling of Stress : Not at all  Social Connections: Socially Isolated (12/08/2019)   Social Connection and Isolation Panel [NHANES]    Frequency of Communication with Friends and Family: More than three times a week    Frequency of Social Gatherings with Friends and Family: More than three times a week    Attends Religious Services: Never    Marine scientist or Organizations: No    Attends Archivist Meetings: Never    Marital Status: Divorced   Past Surgical History:  Procedure Laterality Date   BALLOON DILATION N/A 02/06/2021   Procedure:  BALLOON DILATION;  Surgeon: Melida Quitter, MD;  Location: [place];  Service: ENT;  Laterality: N/A;   BOWEL RESECTION  2006   MICROLARYNGOSCOPY WITH LASER AND BALLOON DILATION N/A 02/06/2021   Procedure: SUSPENDED MICRO DIRECT LARYNGOSCOPY;  Surgeon: Melida Quitter, MD;  Location: [place];  Service: ENT;  Laterality: N/A;   PEG PLACEMENT N/A 09/03/2020   Procedure: PERCUTANEOUS ENDOSCOPIC GASTROSTOMY (PEG) PLACEMENT;  Surgeon: Georganna Skeans, MD;  Location: [place];  Service: General;  Laterality: N/A;   PERCUTANEOUS TRACHEOSTOMY N/A 09/03/2020   Procedure: PERCUTANEOUS TRACHEOSTOMY USING 6 SHILEY;  Surgeon: Georganna Skeans, MD;  Location: [place];  Service: General;  Laterality: N/A;   SKIN GRAFT     TONSILLECTOMY AND ADENOIDECTOMY     Past Surgical History:  Procedure Laterality Date   BALLOON DILATION N/A 02/06/2021   Procedure: BALLOON DILATION;  Surgeon: Melida Quitter, MD;  Location: [place];  Service: ENT;  Laterality: N/A;   BOWEL RESECTION  2006   MICROLARYNGOSCOPY WITH LASER AND BALLOON DILATION N/A 02/06/2021   Procedure: SUSPENDED MICRO DIRECT LARYNGOSCOPY;  Surgeon: Melida Quitter, MD;  Location: [place];  Service: ENT;  Laterality: N/A;   PEG PLACEMENT N/A 09/03/2020   Procedure: PERCUTANEOUS ENDOSCOPIC GASTROSTOMY (PEG) PLACEMENT;  Surgeon: Georganna Skeans, MD;  Location: [place];  Service: General;  Laterality: N/A;   PERCUTANEOUS TRACHEOSTOMY N/A 09/03/2020   Procedure: PERCUTANEOUS TRACHEOSTOMY USING 6 SHILEY;  Surgeon: Georganna Skeans, MD;  Location: [place];  Service: General;  Laterality: N/A;   SKIN GRAFT     TONSILLECTOMY AND ADENOIDECTOMY     Past Medical History:  Diagnosis Date   Anxiety 01/19/2020   Arthritis    Asthma    Benign prostatic hyperplasia with weak urinary stream 12/08/2019   BPH (benign prostatic hyperplasia) 08/17/2020   Chronic respiratory failure with hypoxia ([place]) 01/24/2020   Has home 02 but not using as of 01/24/2020  -  01/24/2020   Walked RA  approx   200 ft  @  slow pace  stopped due to  Dizzy with sats still 95%      Cigarette smoker 01/24/2020   Stopped regular smoking 10/2019 but still "now and then" as of 01/24/2020    COPD (chronic obstructive pulmonary disease) ([place]) 08/17/2020   gold   COPD GOLD ?     Quit smoking 10/2019  - Labs ordered 01/24/2020  :  allergy profile   alpha one AT phenotype   - 01/24/2020  After extensive coaching inhaler device,  effectiveness =    75% (short Ti) >  change symb 80 to 160 2bid      Coronary artery disease 06/08/2018   DES to RCA, [facility], [place])   Depression, major, single episode, moderate ([place]) 01/19/2020   Dyspnea    Encounter for screening for malignant neoplasm of colon 12/08/2019   Essential hypertension 12/08/2019   History of MI (myocardial infarction) 01/19/2020   HTN (hypertension) 08/17/2020   Myocardial infarct ([place]) 2019   Pneumonia    Ht 5\' 8"  (1.727 m)   Wt 196 lb (88.9 kg)   BMI 29.80 kg/m   Opioid Risk Score:   Fall Risk Score:  `1  Depression screen PHQ 2/9     12/25/2021   12:52 PM 12/04/2021    9:56 AM 10/23/2021    1:12 PM 09/03/2021    1:50 PM 08/27/2021    3:48 PM 07/03/2021    1:06 PM 04/10/2021    1:02 PM  Depression screen PHQ 2/9  Decreased Interest 0 0 2 0 0 0 1  Down, Depressed, Hopeless 0 3 2 0 0 0 1  PHQ - 2 Score 0 3 4 0 0 0 2  Altered sleeping  0   0    Tired, decreased energy  3   0    Change in appetite  0   0    Feeling bad or failure about yourself   0   0    Trouble concentrating  3   0    Moving slowly or fidgety/restless  0   0    Suicidal thoughts  0   0    PHQ-9 Score  9   0    Difficult doing work/chores     Not difficult at all       Review of Systems  Musculoskeletal:  Positive for back pain.  All other systems reviewed and are negative.     Objective:   Physical Exam  General: No acute distress HEENT: NCAT, EOMI, oral membranes moist. Trach, phonating a bit better Cards: reg rate  Chest: normal effort Abdomen: Soft,  NT, ND Skin: dry, intact Extremities: no edema Psych: pleasant and appropriate  Skin: intact Neuro: Neurologically appears to be alert and oriented x3.  .  Strength is grossly 5 - to 5 5 out of 5 in all 4 limbs with no focal sensory findings.  ambulating with oxygen cart. Musculoskeletal: ongoing mid back pain with dextroscoliosis with rotation of the spine as well.         Assessment & Plan:  1.  Debility secondary to fall 08/17/20 with 3 column T8 vertebral body fracture/T7 neural arch fracture/right TVP T4-8/right C7 transverse process fracture through the foramen.   progressive disease on recent mri              -maintain HEP as possible. He's limited from a pulmonary and pain standpoint 2.  . Pain Management:  -Given the increase in his pain and his diminishing mobility which is only exacerbating the situation, we will begin a trial of Xtampza 9 mg twice daily Continue Oxycodone 5mg  q6 prn, #120 to allow him 4 per day.   -continue xtampza 13mg  q12 hours scheduled #60.  -May take up to 2000 mg of Tylenol on a daily basis and 3000 occasionally.             - ibuprofen 800 mg twice daily as needed with food             -We will continue the  controlled substance monitoring program, this consists of regular clinic visits, examinations, routine drug screening, pill counts as well as use of [place] Controlled Substance Reporting System. [place] was reviewed today.                -Medication was refilled and a second prescription was sent to the patient's pharmacy for next month.                -UDS today/ 4. Mood: Continue Wellbutrin 300 mg daily, BuSpar 10 mg 3 times daily, Klonopin 0.5 mg nightly,              -Seroquel 150 mg twice daily and 300 mg nightly              -mood is improving .    5.  CAD/MI/DES.  Continue aspirin per cardiology.   6.  Acute hypercarbic ventilatory dependent respiratory failure/COPD.  Tracheostomy 09/03/2020 per Dr. Janee Morn.   -ENT/pumonary  following.                    -  7.  BPH/urine retention:              -oob to void               -Continue hyrtin, Flomax and Urecholine. voiding well.  8 Constipation---              May be able to simplify regimen to senokot-s 1-2 at bedtime and miralax qod            -needs to have bm at least qod     15 minutes of face to face patient care time were spent during this visit. All questions were encouraged and answered. Follow up with in 2 months with me

## 2021-12-26 ENCOUNTER — Other Ambulatory Visit: Payer: Self-pay | Admitting: Internal Medicine

## 2021-12-26 ENCOUNTER — Other Ambulatory Visit: Payer: Self-pay | Admitting: Registered Nurse

## 2021-12-26 NOTE — Telephone Encounter (Signed)
Dr Riley Kill note was reviewed PMP was reviewed Clonazepam e-scribed today.

## 2021-12-27 ENCOUNTER — Encounter: Payer: Self-pay | Admitting: Internal Medicine

## 2021-12-27 ENCOUNTER — Other Ambulatory Visit: Payer: Self-pay | Admitting: *Deleted

## 2021-12-27 MED ORDER — POLYETHYLENE GLYCOL 3350 17 G PO PACK: 17.0000 g | PACK | Freq: Two times a day (BID) | ORAL | 0 refills | Status: DC

## 2021-12-29 LAB — TOXASSURE SELECT,+ANTIDEPR,UR

## 2021-12-30 ENCOUNTER — Telehealth: Payer: Self-pay | Admitting: *Deleted

## 2021-12-30 ENCOUNTER — Telehealth: Payer: Self-pay | Admitting: Internal Medicine

## 2021-12-30 DIAGNOSIS — J449 Chronic obstructive pulmonary disease, unspecified: Secondary | ICD-10-CM

## 2021-12-30 NOTE — Telephone Encounter (Signed)
Paitent is needing a PA done on medication  Formoterol 17mcg/2ml mebulizer solution  Please advise

## 2021-12-30 NOTE — Telephone Encounter (Signed)
Urine drug screen for this encounter is consistent for prescribed medication 

## 2021-12-31 ENCOUNTER — Other Ambulatory Visit (HOSPITAL_COMMUNITY): Payer: Self-pay

## 2021-12-31 ENCOUNTER — Ambulatory Visit: Payer: Medicaid Other | Admitting: Internal Medicine

## 2021-12-31 MED ORDER — FORMOTEROL FUMARATE 20 MCG/2ML IN NEBU: 20.0000 ug | INHALATION_SOLUTION | Freq: Two times a day (BID) | RESPIRATORY_TRACT | 11 refills | Status: DC

## 2021-12-31 NOTE — Telephone Encounter (Signed)
This kind of PA makes no sense, they're all the same drug in different strengths  - would resubmit to [facility]

## 2021-12-31 NOTE — Telephone Encounter (Signed)
For this medication (FORMOTEROL) to be approved for PA, 2 preferred products must me try/failed. This plan covers:  Albuterol 0.63 mg/mL  Albuterol 1.25 mg/mL  Albuterol 2.5 mg/0.34mL  Albuterol 2.5 mg/28mL  Albuterol 5mg /mL   This patient has previously used Albuterol 2.5 mg/71ml. They would need to try at least one other preferred before we could submit documentation of failed/ineffective therapy.  Please advise sir

## 2021-12-31 NOTE — Telephone Encounter (Signed)
I called and spoke with the pt's sister, ok per DPR ([name]). I let her know what the status of the rx for formotorol is. She was okay with [place] sending to [facility]. I have sent rx to them and faxed over insurance info and ov note to Margaret's attn. I advised her that someone from the pharmacy will be reaching out to them. She will call if pt still not able to obtain med. Nothing further needed.

## 2022-01-02 ENCOUNTER — Encounter (INDEPENDENT_AMBULATORY_CARE_PROVIDER_SITE_OTHER): Payer: Self-pay | Admitting: Gastroenterology

## 2022-01-02 ENCOUNTER — Ambulatory Visit (INDEPENDENT_AMBULATORY_CARE_PROVIDER_SITE_OTHER): Payer: Self-pay | Admitting: Gastroenterology

## 2022-01-02 VITALS — BP 116/77 | HR 93 | Temp 98.2°F | Ht 68.0 in | Wt 194.8 lb

## 2022-01-02 DIAGNOSIS — K5903 Drug induced constipation: Secondary | ICD-10-CM

## 2022-01-02 DIAGNOSIS — R5381 Other malaise: Secondary | ICD-10-CM

## 2022-01-02 DIAGNOSIS — Z1211 Encounter for screening for malignant neoplasm of colon: Secondary | ICD-10-CM

## 2022-01-02 DIAGNOSIS — Z1212 Encounter for screening for malignant neoplasm of rectum: Secondary | ICD-10-CM

## 2022-01-02 DIAGNOSIS — K567 Ileus, unspecified: Secondary | ICD-10-CM

## 2022-01-02 DIAGNOSIS — K5900 Constipation, unspecified: Secondary | ICD-10-CM | POA: Insufficient documentation

## 2022-01-02 NOTE — Progress Notes (Signed)
[name], M.D. Gastroenterology & Hepatology [facility] [address] [place], [place] [address]  Primary Care Physician: Anabel Halon, MD [address] [place] [address]  I will communicate my assessment and recommendations to the referring MD via EMR.  Problems: Ileus, likely exacerbated due to chronic opiate use Self-limited rectal bleeding  History of Present Illness: Charles Weeks is a 56 year old male with past medical history of asthma, COPD, BPH, depression, hypertension, coronary artery disease, myocardial infarction, diverticulitis s/p partial colectomy, chronic neck and back pain due to a fall and status post tracheostomy on home oxygen 3 L at home, who presents for hospital follow-up after episode of ileus secondary to chronic opiate use, presumed infectious colitis and rectal bleeding.  The patient was last seen on mid July 2023. At that time, the patient was admitted to the hospital after presenting an episode of abdominal distention, nausea and vomiting.  He underwent cross-sectional abdominal imaging which was suggestive of ileus, possibly secondary to the use of opiates and decreased mobility.  He had an NG tube placement with decompression of his abdominal distention and diet was slowly progressed, as well as with the use of MiraLAX regimen.  During his hospitalization he was found to have imaging changes that were suggestive of colitis and he had 1 isolated episode of rectal bleeding but his hemoglobin never significantly decreased.  Most recent hemoglobin on 12/04/2021 was normal at 14.4.   Sister comes to the appointment with the patient as she is his legal guardian. Reports that the patient is having a BM every day with soft consistency. He is taking 1 senokot at night, 400 mg Docusate (200 mg BID) and Miralax 1 capful daily (sometimes he has to take 2 capfuls). He is also walking on his own and is no  longer on a wheelchair which the sister believes has led to significant improvement of his symptoms. Has been eating well and denies any complaints. The patient denies having any nausea, vomiting, fever, chills, hematochezia, melena, hematemesis, abdominal distention, abdominal pain, diarrhea, jaundice, pruritus or weight loss.  He is taking oxycodone 5 mg every 6 hours and takes Xtampza ER 13.5 mg every 12 hours.  Last Colonoscopy: possibly 15 years ago after partial colectomy  Past Medical History: Past Medical History:  Diagnosis Date   Anxiety 01/19/2020   Arthritis    Asthma    Benign prostatic hyperplasia with weak urinary stream 12/08/2019   BPH (benign prostatic hyperplasia) 08/17/2020   Chronic respiratory failure with hypoxia ([facility]) 01/24/2020   Has home 02 but not using as of 01/24/2020  -  01/24/2020   Walked RA  approx   200 ft  @ slow pace  stopped due to  Dizzy with sats still 95%      Cigarette smoker 01/24/2020   Stopped regular smoking 10/2019 but still "now and then" as of 01/24/2020    COPD (chronic obstructive pulmonary disease) ([facility]) 08/17/2020   gold   COPD GOLD ?     Quit smoking 10/2019  - Labs ordered 01/24/2020  :  allergy profile   alpha one AT phenotype   - 01/24/2020  After extensive coaching inhaler device,  effectiveness =    75% (short Ti) > change symb 80 to 160 2bid      Coronary artery disease 06/08/2018   DES to RCA, [facility] [place])   Depression, major, single episode, moderate ([facility]) 01/19/2020   Dyspnea  Encounter for screening for malignant neoplasm of colon 12/08/2019   Essential hypertension 12/08/2019   History of MI (myocardial infarction) 01/19/2020   HTN (hypertension) 08/17/2020   Myocardial infarct ([facility]) 2019   Pneumonia     Past Surgical History: Past Surgical History:  Procedure Laterality Date   BALLOON DILATION N/A 02/06/2021   Procedure: BALLOON DILATION;  Surgeon: Christia Reading, MD;  Location: [facility] OR;  Service: ENT;   Laterality: N/A;   BOWEL RESECTION  2006   MICROLARYNGOSCOPY WITH LASER AND BALLOON DILATION N/A 02/06/2021   Procedure: SUSPENDED MICRO DIRECT LARYNGOSCOPY;  Surgeon: Christia Reading, MD;  Location: [facility] OR;  Service: ENT;  Laterality: N/A;   PEG PLACEMENT N/A 09/03/2020   Procedure: PERCUTANEOUS ENDOSCOPIC GASTROSTOMY (PEG) PLACEMENT;  Surgeon: Violeta Gelinas, MD;  Location[facility];  Service: General;  Laterality: N/A;   PERCUTANEOUS TRACHEOSTOMY N/A 09/03/2020   Procedure: PERCUTANEOUS TRACHEOSTOMY USING 6 SHILEY;  Surgeon: Violeta Gelinas, MD;  Location: [facility] OR;  Service: General;  Laterality: N/A;   SKIN GRAFT     TONSILLECTOMY AND ADENOIDECTOMY      Family History: Family History  Problem Relation Age of Onset   Heart disease Mother    Heart disease Father    Diabetes Sister     Social History: Social History   Tobacco Use  Smoking Status Former   Packs/day: 1.50   Types: Cigarettes   Quit date: 08/17/2020   Years since quitting: 1.3  Smokeless Tobacco Never   Social History   Substance and Sexual Activity  Alcohol Use Not Currently   Social History   Substance and Sexual Activity  Drug Use Not Currently    Allergies: Allergies  Allergen Reactions   Ciprofloxacin Hives    Medications: Current Outpatient Medications  Medication Sig Dispense Refill   acetaminophen (TYLENOL) 325 MG tablet Take 2 tablets (650 mg total) by mouth every 6 (six) hours as needed for mild pain or moderate pain.     albuterol (PROVENTIL) (2.5 MG/3ML) 0.083% nebulizer solution INHALE (1) VIAL VIA NEBULIZATION EVERY SIX HOURS AS NEEDED FOR WHEEZING OR SHORTNESS OF BREATH. 180 mL 2   aspirin 81 MG chewable tablet Chew 1 tablet (81 mg total) by mouth daily.     bethanechol (URECHOLINE) 25 MG tablet TAKE TWO TABLETS BY MOUTH FOUR TIMES DAILY 180 tablet 0   buPROPion (WELLBUTRIN XL) 300 MG 24 hr tablet TAKE 1 TABLET BY MOUTH ONCE DAILY. 30 tablet 0   busPIRone (BUSPAR) 10 MG tablet TAKE ONE TABLET  BY MOUTH 3 TIMES A DAY 90 tablet 5   Calcium Carb-Cholecalciferol (CALCIUM 600/VITAMIN D) 600-400 MG-UNIT CHEW Chew 1 each by mouth daily.     Cholecalciferol (VITAMIN D3) 125 MCG (5000 UT) CAPS Take 5,000 Units by mouth daily.     clonazePAM (KLONOPIN) 0.5 MG tablet TAKE 1 TABLET BY MOUTH AT BEDTIME. 30 tablet 3   docusate sodium (COLACE) 100 MG capsule Take 200 mg by mouth daily as needed for moderate constipation.     folic acid (FOLVITE) 1 MG tablet TAKE ONE TABLET BY MOUTH ONCE DAILY. 90 tablet 3   Misc. Devices Theme park manager) MISC For transporting to and from Doctors appointments 1 each 0   montelukast (SINGULAIR) 10 MG tablet TAKE ONE TABLET BY MOUTH AT BEDTIME 90 tablet 3   nitroGLYCERIN (NITROSTAT) 0.4 MG SL tablet Place 1 tablet (0.4 mg total) under the tongue every 5 (five) minutes as needed for chest pain. 25 tablet 2   ondansetron (ZOFRAN)  4 MG tablet Take 1 tablet (4 mg total) by mouth every 8 (eight) hours as needed for nausea or vomiting. 20 tablet 0   oxyCODONE (OXY IR/ROXICODONE) 5 MG immediate release tablet Take 1 tablet (5 mg total) by mouth 4 (four) times daily as needed for moderate pain. 120 tablet 0   oxyCODONE ER (XTAMPZA ER) 13.5 MG C12A Take 1 capsule by mouth every 12 (twelve) hours. 60 capsule 0   OXYGEN Inhale 3 L into the lungs continuous.     pantoprazole (PROTONIX) 40 MG tablet TAKE ONE TABLET BY MOUTH ONCE DAILY. 90 tablet 0   polyethylene glycol (MIRALAX / GLYCOLAX) 17 g packet Take 17 g by mouth 2 (two) times daily. 14 each 0   QUEtiapine (SEROQUEL) 300 MG tablet TAKE (1/2) A TABLET BY MOUTH TWICE A DAY, AND (1) TABLET AT BEDTIME. 60 tablet 3   senna-docusate (SENOKOT-S) 8.6-50 MG tablet Take 1 tablet by mouth at bedtime as needed for moderate constipation. 30 tablet 1   tamsulosin (FLOMAX) 0.4 MG CAPS capsule TAKE (1) CAPSULE BY MOUTH TWICE DAILY. 60 capsule 0   terazosin (HYTRIN) 5 MG capsule TAKE ONE CAPSULE BY MOUTH ONCE DAILY. 30 capsule 11   VENTOLIN  HFA 108 (90 Base) MCG/ACT inhaler INHALE 1 OR 2 PUFFS INTO THE LUNGS EVERY 4 HOURS AS NEEDED FOR WHEEZING OR SHORTNESS OF BREATH 18 g 0   budesonide (PULMICORT) 0.25 MG/2ML nebulizer solution 2.5 mg  twice daily with performist (Patient not taking: Reported on 12/25/2021) 120 mL 12   formoterol (PERFOROMIST) 20 MCG/2ML nebulizer solution Take 2 mLs (20 mcg total) by nebulization 2 (two) times daily. Use in nebulizer twice daily perfectly regularly (Patient not taking: Reported on 01/02/2022) 120 mL 11   formoterol (PERFOROMIST) 20 MCG/2ML nebulizer solution Take 2 mLs (20 mcg total) by nebulization 2 (two) times daily. (Patient not taking: Reported on 01/02/2022) 120 mL 11   rosuvastatin (CRESTOR) 5 MG tablet TAKE ONE TABLET BY MOUTH ONCE DAILY 90 tablet 0   UNABLE TO FIND Med Name: Bedside Table (Patient not taking: Reported on 01/02/2022) 1 each 0   No current facility-administered medications for this visit.    Review of Systems: GENERAL: negative for malaise, night sweats HEENT: No changes in hearing or vision, no nose bleeds or other nasal problems. NECK: Negative for lumps, goiter, pain and significant neck swelling RESPIRATORY: Negative for cough, wheezing CARDIOVASCULAR: Negative for chest pain, leg swelling, palpitations, orthopnea GI: SEE HPI MUSCULOSKELETAL: Negative for joint pain or swelling, back pain, and muscle pain. SKIN: Negative for lesions, rash PSYCH: Negative for sleep disturbance, mood disorder and recent psychosocial stressors. HEMATOLOGY Negative for prolonged bleeding, bruising easily, and swollen nodes. ENDOCRINE: Negative for cold or heat intolerance, polyuria, polydipsia and goiter. NEURO: negative for tremor, gait imbalance, syncope and seizures. The remainder of the review of systems is noncontributory.   Physical Exam: BP 116/77 (BP Location: Left Arm, Patient Position: Sitting, Cuff Size: Small)   Pulse 93   Temp 98.2 F (36.8 C) (Oral)   Ht 5\' 8"  (1.727 m)    Wt 194 lb 12.8 oz (88.4 kg)   BMI 29.62 kg/m  GENERAL: The patient is AO x3, in no acute distress. HEENT: Head is normocephalic and atraumatic. EOMI are intact. Mouth is well hydrated and without lesions. NECK: No masses. Has a trachesotomy with significant secretions LUNGS: Rales in both bases. HEART: RRR, normal s1 and s2. ABDOMEN: Soft, nontender, no guarding, no peritoneal signs, and nondistended. BS +.  No masses. Has mid abdominal scar. EXTREMITIES: Without any cyanosis, clubbing, rash, lesions or edema. NEUROLOGIC: AOx3, no focal motor deficit. SKIN: no jaundice, no rashes  Imaging/Labs: as above  I personally reviewed and interpreted the available labs, imaging and endoscopic files.  Impression and Plan: Charles Weeks is a 57 year old male with past medical history of asthma, COPD, BPH, depression, hypertension, coronary artery disease, myocardial infarction, diverticulitis s/p partial colectomy, chronic neck and back pain due to a fall and status post tracheostomy on home oxygen 3 L at home, who presents for hospital follow-up after episode of ileus secondary to chronic opiate use, presumed infectious colitis and rectal bleeding. Patient reports having significant improvement of his symptoms with his current laxative regimen and increasing his physical activity.  He has not presented any more episodes of abdominal distention.  We will continue with current regimen as this has shown to be effective to have regular bowel movements, but I also recommended minimizing the use of opiates as much as possible.  The imaging findings that were suggestive of colitis during his hospitalization were likely an artifact due to distention and do not warrant any further work-up at the moment.  He is bleeding episode was self-limited fortunately.  Given his current poor respiratory status he is not the best candidate to undergo sedation for endoscopic evaluation. The sister agrees with this. He is  overdue for colorectal cancer screening - they would like to pursue Cologuard for now - if positive may need to discuss undergoing colonoscopy at a tertiary center.  -Continue current bowel regimen - 1 senokot at night,  Docusate 200 mg BID and Miralax 1 -3 capfuls daily. -Minimize use of opiates, please update [place] if increasing requirement of opiates -Perform Cologuard testing  All questions were answered.      Dolores Frame, MD Gastroenterology and Hepatology [facility]

## 2022-01-02 NOTE — Patient Instructions (Signed)
Continue current bowel regimen - 1 senokot at night,  Docusate 200 mg BID and Miralax 1 -3 capfuls daily. Minimize use of opiates, please update [place] if increasing requirement of opiates Perform Cologuard testing

## 2022-01-03 ENCOUNTER — Other Ambulatory Visit: Payer: Self-pay | Admitting: Internal Medicine

## 2022-01-03 LAB — CBC WITH DIFFERENTIAL/PLATELET
Basophils Absolute: 0 10*3/uL (ref 0.0–0.2)
Basos: 0 %
EOS (ABSOLUTE): 0.8 10*3/uL — ABNORMAL HIGH (ref 0.0–0.4)
Eos: 9 %
Hematocrit: 43.6 % (ref 37.5–51.0)
Hemoglobin: 14.7 g/dL (ref 13.0–17.7)
Immature Grans (Abs): 0 10*3/uL (ref 0.0–0.1)
Immature Granulocytes: 0 %
Lymphocytes Absolute: 1.6 10*3/uL (ref 0.7–3.1)
Lymphs: 18 %
MCH: 30.4 pg (ref 26.6–33.0)
MCHC: 33.7 g/dL (ref 31.5–35.7)
MCV: 90 fL (ref 79–97)
Monocytes Absolute: 0.7 10*3/uL (ref 0.1–0.9)
Monocytes: 8 %
Neutrophils Absolute: 5.6 10*3/uL (ref 1.4–7.0)
Neutrophils: 65 %
Platelets: 226 10*3/uL (ref 150–450)
RBC: 4.84 x10E6/uL (ref 4.14–5.80)
RDW: 13.5 % (ref 11.6–15.4)
WBC: 8.6 10*3/uL (ref 3.4–10.8)

## 2022-01-03 LAB — HEPATITIS C ANTIBODY: Hep C Virus Ab: NONREACTIVE

## 2022-01-06 ENCOUNTER — Encounter: Payer: Self-pay | Admitting: Internal Medicine

## 2022-01-07 ENCOUNTER — Ambulatory Visit (INDEPENDENT_AMBULATORY_CARE_PROVIDER_SITE_OTHER): Payer: Medicaid Other | Admitting: Internal Medicine

## 2022-01-07 ENCOUNTER — Encounter: Payer: Self-pay | Admitting: Internal Medicine

## 2022-01-07 VITALS — BP 116/76 | HR 102 | Ht 68.0 in | Wt 193.0 lb

## 2022-01-07 DIAGNOSIS — L600 Ingrowing nail: Secondary | ICD-10-CM | POA: Diagnosis not present

## 2022-01-07 DIAGNOSIS — L03032 Cellulitis of left toe: Secondary | ICD-10-CM | POA: Diagnosis not present

## 2022-01-07 MED ORDER — CEPHALEXIN 500 MG PO CAPS: 500.0000 mg | ORAL_CAPSULE | Freq: Three times a day (TID) | ORAL | 0 refills | Status: DC

## 2022-01-07 NOTE — Progress Notes (Signed)
Acute Office Visit  Subjective:    Patient ID: Charles Weeks, male    DOB: 28-Apr-1966, 56 y.o.   MRN: [id number]  Chief Complaint  Patient presents with   Follow-up    Ingrown toenail L big toe     HPI Patient is in today for complaint of pain and swelling over left great toe.  He had an ingrown toenail, which he tried to scrape off with a knife about a month ago.  He has been having progressive pain and swelling with clear discharge of left great toe.  He also has mild tingling over the tip of the toe.  Denies any fever, chills or pus/yellowish discharge from the site.  Past Medical History:  Diagnosis Date   Anxiety 01/19/2020   Arthritis    Asthma    Benign prostatic hyperplasia with weak urinary stream 12/08/2019   BPH (benign prostatic hyperplasia) 08/17/2020   Chronic respiratory failure with hypoxia ([place]) 01/24/2020   Has home 02 but not using as of 01/24/2020  -  01/24/2020   Walked RA  approx   200 ft  @ slow pace  stopped due to  Dizzy with sats still 95%      Cigarette smoker 01/24/2020   Stopped regular smoking 10/2019 but still "now and then" as of 01/24/2020    COPD (chronic obstructive pulmonary disease) ([place]) 08/17/2020   gold   COPD GOLD ?     Quit smoking 10/2019  - Labs ordered 01/24/2020  :  allergy profile   alpha one AT phenotype   - 01/24/2020  After extensive coaching inhaler device,  effectiveness =    75% (short Ti) > change symb 80 to 160 2bid      Coronary artery disease 06/08/2018   DES to RCA, [facility] [place], [place])   Depression, major, single episode, moderate ([place]) 01/19/2020   Dyspnea    Encounter for screening for malignant neoplasm of colon 12/08/2019   Essential hypertension 12/08/2019   History of MI (myocardial infarction) 01/19/2020   HTN (hypertension) 08/17/2020   Myocardial infarct ([place]) 2019   Pneumonia     Past Surgical History:  Procedure Laterality Date   BALLOON DILATION N/A 02/06/2021   Procedure: BALLOON DILATION;   Surgeon: Melida Quitter, MD;  Location: [place];  Service: ENT;  Laterality: N/A;   BOWEL RESECTION  2006   MICROLARYNGOSCOPY WITH LASER AND BALLOON DILATION N/A 02/06/2021   Procedure: SUSPENDED MICRO DIRECT LARYNGOSCOPY;  Surgeon: Melida Quitter, MD;  Location: [place];  Service: ENT;  Laterality: N/A;   PEG PLACEMENT N/A 09/03/2020   Procedure: PERCUTANEOUS ENDOSCOPIC GASTROSTOMY (PEG) PLACEMENT;  Surgeon: Georganna Skeans, MD;  Location: [place];  Service: General;  Laterality: N/A;   PERCUTANEOUS TRACHEOSTOMY N/A 09/03/2020   Procedure: PERCUTANEOUS TRACHEOSTOMY USING 6 SHILEY;  Surgeon: Georganna Skeans, MD;  Location: [place];  Service: General;  Laterality: N/A;   SKIN GRAFT     TONSILLECTOMY AND ADENOIDECTOMY      Family History  Problem Relation Age of Onset   Heart disease Mother    Heart disease Father    Diabetes Sister     Social History   Socioeconomic History   Marital status: Single    Spouse name: Not on file   Number of children: 2   Years of education: Not on file   Highest education level: 7th grade  Occupational History   Not on file  Tobacco Use   Smoking status: Former  Packs/day: 1.50    Types: Cigarettes    Quit date: 08/17/2020    Years since quitting: 1.3   Smokeless tobacco: Never  Vaping Use   Vaping Use: Never used  Substance and Sexual Activity   Alcohol use: Not Currently   Drug use: Not Currently   Sexual activity: Not on file  Other Topics Concern   Not on file  Social History Narrative   ** Merged History Encounter **       Lives with sister  Both daughters live close by   Sister has dog   Enjoys: shooting pool, fishing, Retail banker  Diet: eats all food groups Caffeine: pop-dr pepper 2 liter  Water: 4-6 cups   Wears seat belt  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 No weapons        Social Determinants of Health   Financial Resource Strain: Low Risk  (12/08/2019)   Overall Financial Resource Strain (CARDIA)     Difficulty of Paying Living Expenses: Not hard at all  Food Insecurity: No Food Insecurity (12/08/2019)   Hunger Vital Sign    Worried About Running Out of Food in the Last Year: Never true    Ran Out of Food in the Last Year: Never true  Transportation Needs: No Transportation Needs (12/08/2019)   PRAPARE - Hydrologist (Medical): No    Lack of Transportation (Non-Medical): No  Physical Activity: Inactive (12/08/2019)   Exercise Vital Sign    Days of Exercise per Week: 0 days    Minutes of Exercise per Session: 0 min  Stress: No Stress Concern Present (12/08/2019)   [place]    Feeling of Stress : Not at all  Social Connections: Socially Isolated (12/08/2019)   Social Connection and Isolation Panel [NHANES]    Frequency of Communication with Friends and Family: More than three times a week    Frequency of Social Gatherings with Friends and Family: More than three times a week    Attends Religious Services: Never    Marine scientist or Organizations: No    Attends Archivist Meetings: Never    Marital Status: Divorced  Human resources officer Violence: Not At Risk (12/08/2019)   Humiliation, Afraid, Rape, and Kick questionnaire    Fear of Current or Ex-Partner: No    Emotionally Abused: No    Physically Abused: No    Sexually Abused: No    Outpatient Medications Prior to Visit  Medication Sig Dispense Refill   acetaminophen (TYLENOL) 325 MG tablet Take 2 tablets (650 mg total) by mouth every 6 (six) hours as needed for mild pain or moderate pain.     albuterol (PROVENTIL) (2.5 MG/3ML) 0.083% nebulizer solution INHALE (1) VIAL VIA NEBULIZATION EVERY SIX HOURS AS NEEDED FOR WHEEZING OR SHORTNESS OF BREATH. 180 mL 2   aspirin 81 MG chewable tablet Chew 1 tablet (81 mg total) by mouth daily.     bethanechol (URECHOLINE) 25 MG tablet TAKE 2 TABLETS BY MOUTH (4) TIMES DAILY 180 tablet 0    budesonide (PULMICORT) 0.25 MG/2ML nebulizer solution 2.5 mg  twice daily with performist 120 mL 12   buPROPion (WELLBUTRIN XL) 300 MG 24 hr tablet TAKE 1 TABLET BY MOUTH ONCE DAILY. 30 tablet 0   busPIRone (BUSPAR) 10 MG tablet TAKE ONE TABLET BY MOUTH 3 TIMES A DAY 90 tablet 5   Calcium Carb-Cholecalciferol (CALCIUM 600/VITAMIN D) 600-400  MG-UNIT CHEW Chew 1 each by mouth daily.     Cholecalciferol (VITAMIN D3) 125 MCG (5000 UT) CAPS Take 5,000 Units by mouth daily.     clonazePAM (KLONOPIN) 0.5 MG tablet TAKE 1 TABLET BY MOUTH AT BEDTIME. 30 tablet 3   docusate sodium (COLACE) 100 MG capsule Take 200 mg by mouth daily as needed for moderate constipation.     folic acid (FOLVITE) 1 MG tablet TAKE ONE TABLET BY MOUTH ONCE DAILY. 90 tablet 3   formoterol (PERFOROMIST) 20 MCG/2ML nebulizer solution Take 2 mLs (20 mcg total) by nebulization 2 (two) times daily. Use in nebulizer twice daily perfectly regularly 120 mL 11   formoterol (PERFOROMIST) 20 MCG/2ML nebulizer solution Take 2 mLs (20 mcg total) by nebulization 2 (two) times daily. 120 mL 11   Misc. Devices Theme park manager) MISC For transporting to and from Doctors appointments 1 each 0   montelukast (SINGULAIR) 10 MG tablet TAKE ONE TABLET BY MOUTH AT BEDTIME 90 tablet 3   nitroGLYCERIN (NITROSTAT) 0.4 MG SL tablet Place 1 tablet (0.4 mg total) under the tongue every 5 (five) minutes as needed for chest pain. 25 tablet 2   ondansetron (ZOFRAN) 4 MG tablet Take 1 tablet (4 mg total) by mouth every 8 (eight) hours as needed for nausea or vomiting. 20 tablet 0   oxyCODONE (OXY IR/ROXICODONE) 5 MG immediate release tablet Take 1 tablet (5 mg total) by mouth 4 (four) times daily as needed for moderate pain. 120 tablet 0   oxyCODONE ER (XTAMPZA ER) 13.5 MG C12A Take 1 capsule by mouth every 12 (twelve) hours. 60 capsule 0   OXYGEN Inhale 3 L into the lungs continuous.     pantoprazole (PROTONIX) 40 MG tablet TAKE ONE TABLET BY MOUTH ONCE DAILY. 90  tablet 0   polyethylene glycol (MIRALAX / GLYCOLAX) 17 g packet Take 17 g by mouth 2 (two) times daily. 14 each 0   QUEtiapine (SEROQUEL) 300 MG tablet TAKE (1/2) A TABLET BY MOUTH TWICE A DAY, AND (1) TABLET AT BEDTIME. 60 tablet 3   rosuvastatin (CRESTOR) 5 MG tablet TAKE ONE TABLET BY MOUTH ONCE DAILY 90 tablet 0   senna-docusate (SENOKOT-S) 8.6-50 MG tablet Take 1 tablet by mouth at bedtime as needed for moderate constipation. 30 tablet 1   tamsulosin (FLOMAX) 0.4 MG CAPS capsule TAKE (1) CAPSULE BY MOUTH TWICE DAILY. 60 capsule 0   terazosin (HYTRIN) 5 MG capsule TAKE ONE CAPSULE BY MOUTH ONCE DAILY. 30 capsule 11   UNABLE TO FIND Med Name: Bedside Table 1 each 0   VENTOLIN HFA 108 (90 Base) MCG/ACT inhaler INHALE 1 OR 2 PUFFS INTO THE LUNGS EVERY 4 HOURS AS NEEDED FOR WHEEZING OR SHORTNESS OF BREATH 18 g 0   No facility-administered medications prior to visit.    Allergies  Allergen Reactions   Ciprofloxacin Hives    Review of Systems  Constitutional:  Negative for chills and fever.  HENT:  Negative for congestion and sore throat.   Eyes:  Negative for pain and discharge.  Respiratory:  Negative for cough and shortness of breath.   Cardiovascular:  Negative for chest pain and palpitations.  Gastrointestinal:  Positive for constipation and nausea. Negative for diarrhea and vomiting.  Endocrine: Negative for polydipsia and polyuria.  Genitourinary:  Positive for difficulty urinating. Negative for dysuria and hematuria.  Musculoskeletal:  Positive for arthralgias, back pain, gait problem, myalgias and neck pain.  Skin:        Left great  toe pain and swelling  Neurological:  Negative for dizziness, weakness, numbness and headaches.  Psychiatric/Behavioral:  Positive for decreased concentration, dysphoric mood and sleep disturbance. The patient is nervous/anxious.        Objective:    Physical Exam Vitals reviewed.  Constitutional:      General: He is not in acute distress.     Appearance: He is not diaphoretic.  HENT:     Head: Normocephalic and atraumatic.     Nose: Nose normal.     Mouth/Throat:     Mouth: Mucous membranes are moist.  Eyes:     General: No scleral icterus.    Extraocular Movements: Extraocular movements intact.  Cardiovascular:     Rate and Rhythm: Normal rate and regular rhythm.     Pulses: Normal pulses.     Heart sounds: Normal heart sounds. No murmur heard. Pulmonary:     Breath sounds: Normal breath sounds. No wheezing or rales.     Comments: Tracheostomy in place Musculoskeletal:     Cervical back: Neck supple. No tenderness.     Right lower leg: No edema.     Left lower leg: No edema.  Feet:     Left foot:     Toenail Condition: Left toenails are abnormally thick and ingrown.     Comments: Small hematoma near left great toenail Skin:    General: Skin is warm.  Neurological:     General: No focal deficit present.     Mental Status: He is alert and oriented to person, place, and time.     Sensory: No sensory deficit.     Motor: No weakness.  Psychiatric:        Mood and Affect: Mood normal.        Behavior: Behavior normal.     BP 116/76 (BP Location: Left Arm, Patient Position: Sitting, Cuff Size: Large)   Pulse (!) 102   Ht 5\' 8"  (1.727 m)   Wt 193 lb (87.5 kg)   SpO2 90%   BMI 29.35 kg/m  Wt Readings from Last 3 Encounters:  01/07/22 193 lb (87.5 kg)  01/02/22 194 lb 12.8 oz (88.4 kg)  12/25/21 196 lb (88.9 kg)        Assessment & Plan:   Problem List Items Addressed This Visit       Musculoskeletal and Integument   Paronychia of toe of left foot due to ingrown toenail - Primary    Likely has paronychia from ingrown toenail Advised to avoid using sharp objects at home to avulse toenail Keep area clean and dry Started Keflex Referred to Podiatry      Relevant Medications   cephALEXin (KEFLEX) 500 MG capsule   Other Relevant Orders   Ambulatory referral to Podiatry     Meds ordered this  encounter  Medications   cephALEXin (KEFLEX) 500 MG capsule    Sig: Take 1 capsule (500 mg total) by mouth 3 (three) times daily.    Dispense:  15 capsule    Refill:  0     Stanislaus Kaltenbach 02/24/22, MD

## 2022-01-07 NOTE — Assessment & Plan Note (Signed)
Likely has paronychia from ingrown toenail Advised to avoid using sharp objects at home to avulse toenail Keep area clean and dry Started Keflex Referred to Podiatry

## 2022-01-15 ENCOUNTER — Ambulatory Visit (INDEPENDENT_AMBULATORY_CARE_PROVIDER_SITE_OTHER): Payer: Medicaid Other | Admitting: Podiatry

## 2022-01-15 DIAGNOSIS — L600 Ingrowing nail: Secondary | ICD-10-CM

## 2022-01-15 NOTE — Progress Notes (Signed)
Subjective:  Patient ID: Charles Weeks, male    DOB: 1965-10-27,  MRN: [id number]  Chief Complaint  Patient presents with   Ingrown Toenail    56 y.o. male presents with the above complaint.  Patient presents with right hallux medial lateral ingrown.  Patient said pain for touch is progressive gotten worse.  Hurts with ambulation.  He would like to have removed he has not seen and was prior to seeing me.  Pain scale 7 out of 10 dull achy in nature hurts with pressure hurts with ambulation.   Review of Systems: Negative except as noted in the HPI. Denies N/V/F/Ch.  Past Medical History:  Diagnosis Date   Anxiety 01/19/2020   Arthritis    Asthma    Benign prostatic hyperplasia with weak urinary stream 12/08/2019   BPH (benign prostatic hyperplasia) 08/17/2020   Chronic respiratory failure with hypoxia ([facility]) 01/24/2020   Has home 02 but not using as of 01/24/2020  -  01/24/2020   Walked RA  approx   200 ft  @ slow pace  stopped due to  Dizzy with sats still 95%      Cigarette smoker 01/24/2020   Stopped regular smoking 10/2019 but still "now and then" as of 01/24/2020    COPD (chronic obstructive pulmonary disease) ([facility]) 08/17/2020   gold   COPD GOLD ?     Quit smoking 10/2019  - Labs ordered 01/24/2020  :  allergy profile   alpha one AT phenotype   - 01/24/2020  After extensive coaching inhaler device,  effectiveness =    75% (short Ti) > change symb 80 to 160 2bid      Coronary artery disease 06/08/2018   DES to RCA, [facility] [place], [place])   Depression, major, single episode, moderate ([facility]) 01/19/2020   Dyspnea    Encounter for screening for malignant neoplasm of colon 12/08/2019   Essential hypertension 12/08/2019   History of MI (myocardial infarction) 01/19/2020   HTN (hypertension) 08/17/2020   Myocardial infarct ([facility]) 2019   Pneumonia     Current Outpatient Medications:    acetaminophen (TYLENOL) 325 MG tablet, Take 2 tablets (650 mg total) by mouth every 6 (six)  hours as needed for mild pain or moderate pain., Disp: , Rfl:    albuterol (PROVENTIL) (2.5 MG/3ML) 0.083% nebulizer solution, INHALE (1) VIAL VIA NEBULIZATION EVERY SIX HOURS AS NEEDED FOR WHEEZING OR SHORTNESS OF BREATH., Disp: 180 mL, Rfl: 2   aspirin 81 MG chewable tablet, Chew 1 tablet (81 mg total) by mouth daily., Disp: , Rfl:    bethanechol (URECHOLINE) 25 MG tablet, TAKE 2 TABLETS BY MOUTH (4) TIMES DAILY, Disp: 180 tablet, Rfl: 0   budesonide (PULMICORT) 0.25 MG/2ML nebulizer solution, 2.5 mg  twice daily with performist, Disp: 120 mL, Rfl: 12   buPROPion (WELLBUTRIN XL) 300 MG 24 hr tablet, TAKE 1 TABLET BY MOUTH ONCE DAILY., Disp: 30 tablet, Rfl: 0   busPIRone (BUSPAR) 10 MG tablet, TAKE ONE TABLET BY MOUTH 3 TIMES A DAY, Disp: 90 tablet, Rfl: 5   Calcium Carb-Cholecalciferol (CALCIUM 600/VITAMIN D) 600-400 MG-UNIT CHEW, Chew 1 each by mouth daily., Disp: , Rfl:    cephALEXin (KEFLEX) 500 MG capsule, Take 1 capsule (500 mg total) by mouth 3 (three) times daily., Disp: 15 capsule, Rfl: 0   Cholecalciferol (VITAMIN D3) 125 MCG (5000 UT) CAPS, Take 5,000 Units by mouth daily., Disp: , Rfl:    clonazePAM (KLONOPIN) 0.5 MG tablet, TAKE 1 TABLET BY MOUTH  AT BEDTIME., Disp: 30 tablet, Rfl: 3   docusate sodium (COLACE) 100 MG capsule, Take 200 mg by mouth daily as needed for moderate constipation., Disp: , Rfl:    folic acid (FOLVITE) 1 MG tablet, TAKE ONE TABLET BY MOUTH ONCE DAILY., Disp: 90 tablet, Rfl: 3   formoterol (PERFOROMIST) 20 MCG/2ML nebulizer solution, Take 2 mLs (20 mcg total) by nebulization 2 (two) times daily. Use in nebulizer twice daily perfectly regularly, Disp: 120 mL, Rfl: 11   formoterol (PERFOROMIST) 20 MCG/2ML nebulizer solution, Take 2 mLs (20 mcg total) by nebulization 2 (two) times daily., Disp: 120 mL, Rfl: 11   Misc. Devices (TRANSPORT CHAIR) MISC, For transporting to and from Doctors appointments, Disp: 1 each, Rfl: 0   montelukast (SINGULAIR) 10 MG tablet, TAKE ONE  TABLET BY MOUTH AT BEDTIME, Disp: 90 tablet, Rfl: 3   nitroGLYCERIN (NITROSTAT) 0.4 MG SL tablet, Place 1 tablet (0.4 mg total) under the tongue every 5 (five) minutes as needed for chest pain., Disp: 25 tablet, Rfl: 2   ondansetron (ZOFRAN) 4 MG tablet, Take 1 tablet (4 mg total) by mouth every 8 (eight) hours as needed for nausea or vomiting., Disp: 20 tablet, Rfl: 0   oxyCODONE (OXY IR/ROXICODONE) 5 MG immediate release tablet, Take 1 tablet (5 mg total) by mouth 4 (four) times daily as needed for moderate pain., Disp: 120 tablet, Rfl: 0   oxyCODONE ER (XTAMPZA ER) 13.5 MG C12A, Take 1 capsule by mouth every 12 (twelve) hours., Disp: 60 capsule, Rfl: 0   OXYGEN, Inhale 3 L into the lungs continuous., Disp: , Rfl:    pantoprazole (PROTONIX) 40 MG tablet, TAKE ONE TABLET BY MOUTH ONCE DAILY., Disp: 90 tablet, Rfl: 0   polyethylene glycol (MIRALAX / GLYCOLAX) 17 g packet, Take 17 g by mouth 2 (two) times daily., Disp: 14 each, Rfl: 0   QUEtiapine (SEROQUEL) 300 MG tablet, TAKE (1/2) A TABLET BY MOUTH TWICE A DAY, AND (1) TABLET AT BEDTIME., Disp: 60 tablet, Rfl: 3   rosuvastatin (CRESTOR) 5 MG tablet, TAKE ONE TABLET BY MOUTH ONCE DAILY, Disp: 90 tablet, Rfl: 0   senna-docusate (SENOKOT-S) 8.6-50 MG tablet, Take 1 tablet by mouth at bedtime as needed for moderate constipation., Disp: 30 tablet, Rfl: 1   tamsulosin (FLOMAX) 0.4 MG CAPS capsule, TAKE (1) CAPSULE BY MOUTH TWICE DAILY., Disp: 60 capsule, Rfl: 0   terazosin (HYTRIN) 5 MG capsule, TAKE ONE CAPSULE BY MOUTH ONCE DAILY., Disp: 30 capsule, Rfl: 11   UNABLE TO FIND, Med Name: Bedside Table, Disp: 1 each, Rfl: 0   VENTOLIN HFA 108 (90 Base) MCG/ACT inhaler, INHALE 1 OR 2 PUFFS INTO THE LUNGS EVERY 4 HOURS AS NEEDED FOR WHEEZING OR SHORTNESS OF BREATH, Disp: 18 g, Rfl: 0  Social History   Tobacco Use  Smoking Status Former   Packs/day: 1.50   Types: Cigarettes   Quit date: 08/17/2020   Years since quitting: 1.4  Smokeless Tobacco Never     Allergies  Allergen Reactions   Ciprofloxacin Hives   Objective:  There were no vitals filed for this visit. There is no height or weight on file to calculate BMI. Constitutional Well developed. Well nourished.  Vascular Dorsalis pedis pulses palpable bilaterally. Posterior tibial pulses palpable bilaterally. Capillary refill normal to all digits.  No cyanosis or clubbing noted. Pedal hair growth normal.  Neurologic Normal speech. Oriented to person, place, and time. Epicritic sensation to light touch grossly present bilaterally.  Dermatologic Painful ingrowing nail at  both medial lateral  nail borders of the hallux nail right. No other open wounds. No skin lesions.  Orthopedic: Normal joint ROM without pain or crepitus bilaterally. No visible deformities. No bony tenderness.   Radiographs: None Assessment:   1. Ingrown toenail of right foot    Plan:  Patient was evaluated and treated and all questions answered.  Ingrown Nail, right -Patient elects to proceed with minor surgery to remove ingrown toenail removal today. Consent reviewed and signed by patient. -Ingrown nail excised. See procedure note. -Educated on post-procedure care including soaking. Written instructions provided and reviewed. -Patient to follow up in 2 weeks for nail check.  Procedure: Excision of Ingrown Toenail Location: Right 1st toe  both medial and lateral  nail borders. Anesthesia: Lidocaine 1% plain; 1.5 mL and Marcaine 0.5% plain; 1.5 mL, digital block. Skin Prep: Betadine. Dressing: Silvadene; telfa; dry, sterile, compression dressing. Technique: Following skin prep, the toe was exsanguinated and a tourniquet was secured at the base of the toe. The affected nail border was freed, split with a nail splitter, and excised. Chemical matrixectomy was then performed with phenol and irrigated out with alcohol. The tourniquet was then removed and sterile dressing applied. Disposition: Patient  tolerated procedure well. Patient to return in 2 weeks for follow-up.   No follow-ups on file.

## 2022-01-16 ENCOUNTER — Other Ambulatory Visit: Payer: Self-pay | Admitting: Internal Medicine

## 2022-01-18 ENCOUNTER — Other Ambulatory Visit: Payer: Self-pay | Admitting: Internal Medicine

## 2022-01-18 DIAGNOSIS — J449 Chronic obstructive pulmonary disease, unspecified: Secondary | ICD-10-CM

## 2022-01-25 ENCOUNTER — Other Ambulatory Visit: Payer: Self-pay | Admitting: Internal Medicine

## 2022-01-25 LAB — COLOGUARD: COLOGUARD: POSITIVE — AB

## 2022-01-27 ENCOUNTER — Encounter (INDEPENDENT_AMBULATORY_CARE_PROVIDER_SITE_OTHER): Payer: Self-pay | Admitting: Gastroenterology

## 2022-01-28 ENCOUNTER — Telehealth: Payer: Self-pay | Admitting: Internal Medicine

## 2022-01-29 NOTE — Telephone Encounter (Signed)
[facility] is stating that they are needing a prior auth for   Formoterol 74mcg/2ml nebulizer solution.  Please advise

## 2022-01-30 ENCOUNTER — Other Ambulatory Visit (HOSPITAL_COMMUNITY): Payer: Self-pay

## 2022-01-30 ENCOUNTER — Telehealth: Payer: Self-pay

## 2022-01-30 NOTE — Telephone Encounter (Signed)
Patient Advocate Encounter   Prior authorization is required for Formoterol 30mcg/2ml nebulizer solution.  Submitted: 01/30/2022 Confirmation #:[id number] Hurley Cisco, CPhT Rx Patient Advocate Phone: [phone]

## 2022-01-31 ENCOUNTER — Other Ambulatory Visit (HOSPITAL_COMMUNITY): Payer: Self-pay

## 2022-01-31 ENCOUNTER — Other Ambulatory Visit: Payer: Self-pay | Admitting: Gastroenterology

## 2022-01-31 ENCOUNTER — Encounter (INDEPENDENT_AMBULATORY_CARE_PROVIDER_SITE_OTHER): Payer: Self-pay | Admitting: Gastroenterology

## 2022-01-31 DIAGNOSIS — K5903 Drug induced constipation: Secondary | ICD-10-CM

## 2022-01-31 MED ORDER — POLYETHYLENE GLYCOL 3350 17 GM/SCOOP PO POWD: 1.0000 | Freq: Every day | ORAL | 5 refills | Status: DC

## 2022-01-31 NOTE — Telephone Encounter (Signed)
Patient Advocate Encounter  Prior Authorization for Formoterol 48mcg/2ml  has been approved.   Effective: 01/30/2022 to 01/30/2023   Melanee Spry CPhT Rx Patient Advocate

## 2022-01-31 NOTE — Telephone Encounter (Signed)
Called and notified patient the PA was approved and that he should be getting medication soon. Nothing further needed

## 2022-02-07 ENCOUNTER — Telehealth: Payer: Self-pay | Admitting: *Deleted

## 2022-02-07 NOTE — Telephone Encounter (Signed)
Said Rueb is going to be out of his pain medication and his sister has called three times today about it.  It is Friday and it should not have been requested today. I checked with the pharmacy and he has the rx;s at the pharmacy already(on hold). I have let her know, and I gave her a warning that calling on Friday's are discouraged because they may not be able to be addressed before the weekend.

## 2022-02-10 ENCOUNTER — Encounter (INDEPENDENT_AMBULATORY_CARE_PROVIDER_SITE_OTHER): Payer: Self-pay | Admitting: Gastroenterology

## 2022-02-10 ENCOUNTER — Encounter: Payer: Self-pay | Admitting: Internal Medicine

## 2022-02-15 ENCOUNTER — Other Ambulatory Visit: Payer: Self-pay | Admitting: Internal Medicine

## 2022-02-17 ENCOUNTER — Other Ambulatory Visit: Payer: Self-pay | Admitting: Internal Medicine

## 2022-02-17 ENCOUNTER — Encounter (INDEPENDENT_AMBULATORY_CARE_PROVIDER_SITE_OTHER): Payer: Self-pay | Admitting: Gastroenterology

## 2022-02-18 ENCOUNTER — Other Ambulatory Visit: Payer: Self-pay | Admitting: Internal Medicine

## 2022-02-18 DIAGNOSIS — J449 Chronic obstructive pulmonary disease, unspecified: Secondary | ICD-10-CM

## 2022-02-19 ENCOUNTER — Telehealth: Payer: Self-pay | Admitting: Internal Medicine

## 2022-02-20 NOTE — Telephone Encounter (Signed)
[facility] has Epic access. No recent orders have been sent to [facility]. Will close encounter.

## 2022-02-26 ENCOUNTER — Encounter: Payer: Medicaid Other | Attending: Physical Medicine & Rehabilitation | Admitting: Physical Medicine & Rehabilitation

## 2022-02-26 ENCOUNTER — Encounter: Payer: Self-pay | Admitting: Physical Medicine & Rehabilitation

## 2022-02-26 VITALS — BP 128/82 | HR 89 | Wt 201.0 lb

## 2022-02-26 DIAGNOSIS — K5903 Drug induced constipation: Secondary | ICD-10-CM | POA: Diagnosis present

## 2022-02-26 DIAGNOSIS — G894 Chronic pain syndrome: Secondary | ICD-10-CM | POA: Diagnosis present

## 2022-02-26 DIAGNOSIS — S22008S Other fracture of unspecified thoracic vertebra, sequela: Secondary | ICD-10-CM | POA: Insufficient documentation

## 2022-02-26 DIAGNOSIS — S22000G Wedge compression fracture of unspecified thoracic vertebra, subsequent encounter for fracture with delayed healing: Secondary | ICD-10-CM | POA: Diagnosis present

## 2022-02-26 MED ORDER — OXYCODONE HCL 5 MG PO TABS: 5.0000 mg | ORAL_TABLET | Freq: Four times a day (QID) | ORAL | 0 refills | Status: DC | PRN

## 2022-02-26 MED ORDER — XTAMPZA ER 13.5 MG PO C12A: 1.0000 | EXTENDED_RELEASE_CAPSULE | Freq: Two times a day (BID) | ORAL | 0 refills | Status: DC

## 2022-02-26 NOTE — Patient Instructions (Addendum)
ALWAYS FEEL FREE TO CALL OUR OFFICE WITH ANY PROBLEMS OR QUESTIONS ([phone])  **PLEASE NOTE** ALL MEDICATION REFILL REQUESTS (INCLUDING CONTROLLED SUBSTANCES) NEED TO BE MADE AT LEAST 7 DAYS PRIOR TO REFILL BEING DUE. ANY REFILL REQUESTS INSIDE THAT TIME FRAME MAY RESULT IN DELAYS IN RECEIVING YOUR PRESCRIPTION.     YOU NEED TO MOVE YOUR BOWELS EVERY DAY OR EVERY OTHER DAY. IT SHOULD BE FORMED BUT SOFT   I WANT YOU OUTSIDE AND WALKING AND EXERCISING EACH DAY. YOU NEED TO INCREASE YOUR EXERCISE

## 2022-02-26 NOTE — Progress Notes (Signed)
Subjective:    Patient ID: Charles Weeks, male    DOB: April 02, 1966, 56 y.o.   MRN: [id number]  HPI  Charles Weeks is here in follow up of his chronic pain. He has had issues with constipation and has been in hospital due to obstipation. He's now taking senna, colace and miralax daily which is working for him. He says he moves his bowels every day to every other day.   He has a colonoscopy scheduled next month after a positive screen.   He's been more and more sedentary over the last few months since I seen him.  He really cannot give me an explanation why.  He seems to lack some motivation.  He does have an aide at home and has a dog at the house but still is not getting outside much.  He has a deck that he can sit on and overall he seems to be moving better.  Pain overall is under reasonable control with his Xtampza 13.5 mg every 12 hours and oxycodone 5 mg every 6 hours as needed  He continues with his chronic trach.  Situation is generally stable there.  Pain Inventory Average Pain 8 Pain Right Now 8 My pain is sharp, burning, dull, stabbing, and aching  In the last 24 hours, has pain interfered with the following? General activity 0 Relation with others 3 Enjoyment of life 5 What TIME of day is your pain at its worst? daytime and evening Sleep (in general) Fair  Pain is worse with: bending, sitting, and standing Pain improves with: medication Relief from Meds: 5  Family History  Problem Relation Age of Onset   Heart disease Mother    Heart disease Father    Diabetes Sister    Social History   Socioeconomic History   Marital status: Single    Spouse name: Not on file   Number of children: 2   Years of education: Not on file   Highest education level: 7th grade  Occupational History   Not on file  Tobacco Use   Smoking status: Former    Packs/day: 1.50    Types: Cigarettes    Quit date: 08/17/2020    Years since quitting: 1.5   Smokeless tobacco: Never  Vaping Use    Vaping Use: Never used  Substance and Sexual Activity   Alcohol use: Not Currently   Drug use: Not Currently   Sexual activity: Not on file  Other Topics Concern   Not on file  Social History Narrative   ** Merged History Encounter **       Lives with sister  Both daughters live close by   Sister has dog   Enjoys: shooting pool, fishing, Retail banker  Diet: eats all food groups Caffeine: pop-dr pepper 2 liter  Water: 4-6 cups   Wears seat belt  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 No weapons        Social Determinants of Health   Financial Resource Strain: Low Risk  (12/08/2019)   Overall Financial Resource Strain (CARDIA)    Difficulty of Paying Living Expenses: Not hard at all  Food Insecurity: No Food Insecurity (12/08/2019)   Hunger Vital Sign    Worried About Running Out of Food in the Last Year: Never true    Ran Out of Food in the Last Year: Never true  Transportation Needs: No Transportation Needs (12/08/2019)   PRAPARE - Hydrologist (Medical): No  Lack of Transportation (Non-Medical): No  Physical Activity: Inactive (12/08/2019)   Exercise Vital Sign    Days of Exercise per Week: 0 days    Minutes of Exercise per Session: 0 min  Stress: No Stress Concern Present (12/08/2019)   [place]    Feeling of Stress : Not at all  Social Connections: Socially Isolated (12/08/2019)   Social Connection and Isolation Panel [NHANES]    Frequency of Communication with Friends and Family: More than three times a week    Frequency of Social Gatherings with Friends and Family: More than three times a week    Attends Religious Services: Never    Marine scientist or Organizations: No    Attends Archivist Meetings: Never    Marital Status: Divorced   Past Surgical History:  Procedure Laterality Date   BALLOON DILATION N/A 02/06/2021   Procedure:  BALLOON DILATION;  Surgeon: Melida Quitter, MD;  Location: [place];  Service: ENT;  Laterality: N/A;   BOWEL RESECTION  2006   MICROLARYNGOSCOPY WITH LASER AND BALLOON DILATION N/A 02/06/2021   Procedure: SUSPENDED MICRO DIRECT LARYNGOSCOPY;  Surgeon: Melida Quitter, MD;  Location: [place];  Service: ENT;  Laterality: N/A;   PEG PLACEMENT N/A 09/03/2020   Procedure: PERCUTANEOUS ENDOSCOPIC GASTROSTOMY (PEG) PLACEMENT;  Surgeon: Georganna Skeans, MD;  Location: [place];  Service: General;  Laterality: N/A;   PERCUTANEOUS TRACHEOSTOMY N/A 09/03/2020   Procedure: PERCUTANEOUS TRACHEOSTOMY USING 6 SHILEY;  Surgeon: Georganna Skeans, MD;  Location: [place];  Service: General;  Laterality: N/A;   SKIN GRAFT     TONSILLECTOMY AND ADENOIDECTOMY     Past Surgical History:  Procedure Laterality Date   BALLOON DILATION N/A 02/06/2021   Procedure: BALLOON DILATION;  Surgeon: Melida Quitter, MD;  Location: [place];  Service: ENT;  Laterality: N/A;   BOWEL RESECTION  2006   MICROLARYNGOSCOPY WITH LASER AND BALLOON DILATION N/A 02/06/2021   Procedure: SUSPENDED MICRO DIRECT LARYNGOSCOPY;  Surgeon: Melida Quitter, MD;  Location: [place];  Service: ENT;  Laterality: N/A;   PEG PLACEMENT N/A 09/03/2020   Procedure: PERCUTANEOUS ENDOSCOPIC GASTROSTOMY (PEG) PLACEMENT;  Surgeon: Georganna Skeans, MD;  Location: [place];  Service: General;  Laterality: N/A;   PERCUTANEOUS TRACHEOSTOMY N/A 09/03/2020   Procedure: PERCUTANEOUS TRACHEOSTOMY USING 6 SHILEY;  Surgeon: Georganna Skeans, MD;  Location: [place];  Service: General;  Laterality: N/A;   SKIN GRAFT     TONSILLECTOMY AND ADENOIDECTOMY     Past Medical History:  Diagnosis Date   Anxiety 01/19/2020   Arthritis    Asthma    Benign prostatic hyperplasia with weak urinary stream 12/08/2019   BPH (benign prostatic hyperplasia) 08/17/2020   Chronic respiratory failure with hypoxia ([place]) 01/24/2020   Has home 02 but not using as of 01/24/2020  -  01/24/2020   Walked RA  approx   200 ft  @  slow pace  stopped due to  Dizzy with sats still 95%      Cigarette smoker 01/24/2020   Stopped regular smoking 10/2019 but still "now and then" as of 01/24/2020    COPD (chronic obstructive pulmonary disease) ([place]) 08/17/2020   gold   COPD GOLD ?     Quit smoking 10/2019  - Labs ordered 01/24/2020  :  allergy profile   alpha one AT phenotype   - 01/24/2020  After extensive coaching inhaler device,  effectiveness =    75% (short Ti) >  change symb 80 to 160 2bid      Coronary artery disease 06/08/2018   DES to RCA, [facility], [place])   Depression, major, single episode, moderate ([place]) 01/19/2020   Dyspnea    Encounter for screening for malignant neoplasm of colon 12/08/2019   Essential hypertension 12/08/2019   History of MI (myocardial infarction) 01/19/2020   HTN (hypertension) 08/17/2020   Myocardial infarct ([place]) 2019   Pneumonia    BP 128/82   Pulse 89   Wt 201 lb (91.2 kg)   SpO2 96%   BMI 30.56 kg/m   Opioid Risk Score:   Fall Risk Score:  `1  Depression screen [facility] 2/9     01/07/2022   10:13 AM 12/25/2021   12:52 PM 12/04/2021    9:56 AM 10/23/2021    1:12 PM 09/03/2021    1:50 PM 08/27/2021    3:48 PM 07/03/2021    1:06 PM  Depression screen PHQ 2/9  Decreased Interest 0 0 0 2 0 0 0  Down, Depressed, Hopeless 3 0 3 2 0 0 0  PHQ - 2 Score 3 0 3 4 0 0 0  Altered sleeping 0  0   0   Tired, decreased energy 0  3   0   Change in appetite 1  0   0   Feeling bad or failure about yourself  1  0   0   Trouble concentrating 0  3   0   Moving slowly or fidgety/restless 0  0   0   Suicidal thoughts 0  0   0   PHQ-9 Score 5  9   0   Difficult doing work/chores Somewhat difficult     Not difficult at all      Review of Systems  Musculoskeletal:  Positive for back pain and neck pain.  All other systems reviewed and are negative.     Objective:   Physical Exam  General: No acute distress HEENT: Trach present.  Seems to be phonating better with the trach  overall. Cards: reg rate  Chest: normal effort Abdomen: Soft, NT, ND Skin: dry, intact Extremities: no edema Psych: pleasant and appropriate  Skin: intact Neuro: Neurologically appears to be alert and oriented x3.  .  Strength is grossly 5 - to 5 5 out of 5 in all 4 limbs with no focal sensory findings.  Patient ambulated for me today with his oxygen tanks and really is moving the best that I have seen him.  Demonstrates reasonable weight shift and balance today.   Musculoskeletal: Ongoing back discomfort in the T4-T10 range remains..  He has ongoing dextroscoliosis with rotation of the spine as well.  Appears more comfortable seated and standing overall.    Assessment & Plan:  1.  Debility secondary to fall 08/17/20 with 3 column T8 vertebral body fracture/T7 neural arch fracture/right TVP T4-8/right C7 transverse process fracture through the foramen.   progressive disease on recent mri              -maintain HEP as much as he can              -Continue with home health aide  -Discussed the importance of maintaining his activity levels for multiple benefits they can bring him 2.  . Pain Management:  -Continue Xtampza 13.5 mg every 12 hours Continue Oxycodone 5mg  q6 prn, #120 to allow him 4 per day.   -We will continue the controlled substance  monitoring program, this consists of regular clinic visits, examinations, routine drug screening, pill counts as well as use of [place] Controlled Substance Reporting System. [place] today.                 4. Mood: Continue Wellbutrin 300 mg daily, BuSpar 10 mg 3 times daily, Klonopin 0.5 mg nightly,              -Seroquel 150 mg twice daily and 300 mg nightly              -mood is seems improved.    5.  CAD/MI/DES.  Continue aspirin per cardiology.   6.  Acute hypercarbic ventilatory dependent respiratory failure/COPD.  Tracheostomy 09/03/2020 per Dr. Grandville Silos.   -ENT/pumonary  following.                   -  7.  BPH/urine  retention:              -oob to void for all attempts              -Continue hyrtin, Flomax and Urecholine. voiding well.  8 Constipation---              Discussed his bowel regimen at length.  -Maintain softeners and laxatives.  Continue MiraLAX daily to daily as needed.  -Ideally should have a bowel movement daily but I would be happy with every other day.  Discussed importance of appropriate stool consistency.   -Maintain appropriate liquids and diet as well as exercise.   20 minutes of face to face patient care time were spent during this visit. All questions were encouraged and answered. Follow up with in 2 months with nurse practitioner

## 2022-03-05 ENCOUNTER — Other Ambulatory Visit (HOSPITAL_COMMUNITY)
Admission: RE | Admit: 2022-03-05 | Discharge: 2022-03-05 | Disposition: A | Discharge status: Home / Self Care | Payer: Medicaid Other | Source: Ambulatory Visit | Attending: Cardiology | Admitting: Cardiology

## 2022-03-05 ENCOUNTER — Encounter: Payer: Self-pay | Admitting: Cardiology

## 2022-03-05 ENCOUNTER — Ambulatory Visit (INDEPENDENT_AMBULATORY_CARE_PROVIDER_SITE_OTHER): Payer: Medicaid Other | Admitting: Cardiology

## 2022-03-05 VITALS — BP 122/70 | HR 92 | Ht 68.0 in | Wt 200.8 lb

## 2022-03-05 DIAGNOSIS — I25119 Atherosclerotic heart disease of native coronary artery with unspecified angina pectoris: Secondary | ICD-10-CM

## 2022-03-05 DIAGNOSIS — E782 Mixed hyperlipidemia: Secondary | ICD-10-CM | POA: Insufficient documentation

## 2022-03-05 LAB — LIPID PANEL
Cholesterol: 137 mg/dL (ref 0–200)
HDL: 42 mg/dL (ref >40)
LDL Cholesterol: 35 mg/dL (ref 0–99)
Total CHOL/HDL Ratio: 3.3 RATIO
Triglycerides: 298 mg/dL — ABNORMAL HIGH (ref <150)
VLDL: 60 mg/dL — ABNORMAL HIGH (ref 0–40)

## 2022-03-05 NOTE — Patient Instructions (Signed)
Medication Instructions:  Your physician recommends that you continue on your current medications as directed. Please refer to the Current Medication list given to you today.   Labwork: Fasting Lipid today  Testing/Procedures: None today  Follow-Up: 6 months  Any Other Special Instructions Will Be Listed Below (If Applicable).  If you need a refill on your cardiac medications before your next appointment, please call your pharmacy.

## 2022-03-05 NOTE — Progress Notes (Signed)
Cardiology Office Note  Date: 03/05/2022   ID: Charles Weeks, DOB August 02, 1965, MRN [id number]  PCP:  Anabel Halon, MD  Cardiologist:  Nona Dell, MD Electrophysiologist:  None   Chief Complaint  Patient presents with   Cardiac follow-up    History of Present Illness: Charles Weeks is a 56 y.o. male last seen in July by Ms. Ingold NP, I reviewed the note.  He is here today with an Geophysicist/field seismologist.  He does not report any obvious angina or nitroglycerin use in the interim.  Anticipates colonoscopy in the first part of November at [facility] for evaluation of heme positive stools.  Not clear whether it has been requested to come off aspirin or not.  Cardiac regimen includes aspirin Crestor, and as needed nitroglycerin.  I personally reviewed his ECG from July which showed sinus tachycardia with nonspecific ST-T changes.  Lab work from December 2022 revealed LDL 50.  More recently hemoglobin was 14.7 and creatinine 0.9 with normal potassium.  Ischemic testing from September of last year was low risk as outlined below.  I personally reviewed his follow-up ECG today which shows normal sinus rhythm with nonspecific ST changes.  Past Medical History:  Diagnosis Date   Anxiety 01/19/2020   Arthritis    Asthma    Benign prostatic hyperplasia with weak urinary stream 12/08/2019   BPH (benign prostatic hyperplasia) 08/17/2020   Chronic respiratory failure with hypoxia (HCC) 01/24/2020   Has home 02 but not using as of 01/24/2020  -  01/24/2020   Walked RA  approx   200 ft  @ slow pace  stopped due to  Dizzy with sats still 95%      Cigarette smoker 01/24/2020   Stopped regular smoking 10/2019 but still "now and then" as of 01/24/2020    COPD (chronic obstructive pulmonary disease) (HCC) 08/17/2020   gold   COPD GOLD ?     Quit smoking 10/2019  - Labs ordered 01/24/2020  :  allergy profile   alpha one AT phenotype   - 01/24/2020  After extensive coaching inhaler device,  effectiveness =    75% (short Ti)  > change symb 80 to 160 2bid      Coronary artery disease 06/08/2018   DES to RCA, [facility] [place], [place])   Depression, major, single episode, moderate (HCC) 01/19/2020   Dyspnea    Encounter for screening for malignant neoplasm of colon 12/08/2019   Essential hypertension 12/08/2019   History of MI (myocardial infarction) 01/19/2020   HTN (hypertension) 08/17/2020   Myocardial infarct (HCC) 2019   Pneumonia    Positive colorectal cancer screening using Cologuard test     Past Surgical History:  Procedure Laterality Date   BALLOON DILATION N/A 02/06/2021   Procedure: BALLOON DILATION;  Surgeon: Christia Reading, MD;  [facility];  Service: ENT;  Laterality: N/A;   BOWEL RESECTION  2006   MICROLARYNGOSCOPY WITH LASER AND BALLOON DILATION N/A 02/06/2021   Procedure: SUSPENDED MICRO DIRECT LARYNGOSCOPY;  Surgeon: Christia Reading, MD;  Location: [facility] OR;  Service: ENT;  Laterality: N/A;   PEG PLACEMENT N/A 09/03/2020   Procedure: PERCUTANEOUS ENDOSCOPIC GASTROSTOMY (PEG) PLACEMENT;  Surgeon: Violeta Gelinas, MD;  Location: [facility] OR;  Service: General;  Laterality: N/A;   PERCUTANEOUS TRACHEOSTOMY N/A 09/03/2020   Procedure: PERCUTANEOUS TRACHEOSTOMY USING 6 SHILEY;  Surgeon: Violeta Gelinas, MD;  Location: [facility] OR;  Service: General;  Laterality: N/A;   SKIN GRAFT     TONSILLECTOMY AND ADENOIDECTOMY  Current Outpatient Medications  Medication Sig Dispense Refill   acetaminophen (TYLENOL) 325 MG tablet Take 2 tablets (650 mg total) by mouth every 6 (six) hours as needed for mild pain or moderate pain.     aspirin 81 MG chewable tablet Chew 1 tablet (81 mg total) by mouth daily.     bethanechol (URECHOLINE) 25 MG tablet TAKE 2 TABLETS BY MOUTH (4) TIMES DAILY 180 tablet 0   budesonide (PULMICORT) 0.25 MG/2ML nebulizer solution 2.5 mg  twice daily with performist 120 mL 12   buPROPion (WELLBUTRIN XL) 300 MG 24 hr tablet TAKE 1 TABLET BY MOUTH ONCE DAILY. 30 tablet 0   busPIRone  (BUSPAR) 10 MG tablet TAKE ONE TABLET BY MOUTH 3 TIMES A DAY 90 tablet 0   Calcium Carb-Cholecalciferol (CALCIUM 600/VITAMIN D) 600-400 MG-UNIT CHEW Chew 1 each by mouth daily.     cephALEXin (KEFLEX) 500 MG capsule Take 1 capsule (500 mg total) by mouth 3 (three) times daily. 15 capsule 0   Cholecalciferol (VITAMIN D3) 125 MCG (5000 UT) CAPS Take 5,000 Units by mouth daily.     clonazePAM (KLONOPIN) 0.5 MG tablet TAKE 1 TABLET BY MOUTH AT BEDTIME. 30 tablet 3   docusate sodium (COLACE) 100 MG capsule Take 200 mg by mouth daily as needed for moderate constipation.     Docusate Sodium (STOOL SOFTENER LAXATIVE PO) Take 200 mg by mouth daily. 2-4 tablets     folic acid (FOLVITE) 1 MG tablet TAKE ONE TABLET BY MOUTH ONCE DAILY. 90 tablet 3   formoterol (PERFOROMIST) 20 MCG/2ML nebulizer solution Take 2 mLs (20 mcg total) by nebulization 2 (two) times daily. Use in nebulizer twice daily perfectly regularly 120 mL 11   formoterol (PERFOROMIST) 20 MCG/2ML nebulizer solution Take 2 mLs (20 mcg total) by nebulization 2 (two) times daily. 120 mL 11   Misc. Devices Optometrist) MISC For transporting to and from Doctors appointments 1 each 0   montelukast (SINGULAIR) 10 MG tablet TAKE ONE TABLET BY MOUTH AT BEDTIME 90 tablet 3   nitroGLYCERIN (NITROSTAT) 0.4 MG SL tablet Place 1 tablet (0.4 mg total) under the tongue every 5 (five) minutes as needed for chest pain. 25 tablet 2   ondansetron (ZOFRAN) 4 MG tablet Take 1 tablet (4 mg total) by mouth every 8 (eight) hours as needed for nausea or vomiting. 20 tablet 0   oxyCODONE (OXY IR/ROXICODONE) 5 MG immediate release tablet Take 1 tablet (5 mg total) by mouth 4 (four) times daily as needed for moderate pain. 120 tablet 0   oxyCODONE ER (XTAMPZA ER) 13.5 MG C12A Take 1 capsule by mouth every 12 (twelve) hours. 60 capsule 0   OXYGEN Inhale 3 L into the lungs continuous.     pantoprazole (PROTONIX) 40 MG tablet TAKE ONE TABLET BY MOUTH ONCE DAILY. 90 tablet 0    polyethylene glycol powder (GLYCOLAX/MIRALAX) 17 GM/SCOOP powder Take 255 g by mouth daily. 850 g 5   QUEtiapine (SEROQUEL) 300 MG tablet TAKE (1/2) A TABLET BY MOUTH TWICE A DAY, AND (1) TABLET AT BEDTIME. 60 tablet 3   rosuvastatin (CRESTOR) 5 MG tablet TAKE ONE TABLET BY MOUTH ONCE DAILY 90 tablet 0   tamsulosin (FLOMAX) 0.4 MG CAPS capsule TAKE (1) CAPSULE BY MOUTH TWICE DAILY. 60 capsule 0   terazosin (HYTRIN) 5 MG capsule TAKE ONE CAPSULE BY MOUTH ONCE DAILY. 30 capsule 11   UNABLE TO FIND Med Name: Bedside Table 1 each 0   VENTOLIN HFA 108 (90  Base) MCG/ACT inhaler INHALE 1 OR 2 PUFFS INTO THE LUNGS EVERY 4 HOURS AS NEEDED FOR WHEEZING OR SHORTNESS OF BREATH 18 g 0   No current facility-administered medications for this visit.   Allergies:  Ciprofloxacin   ROS: No palpitations or syncope.  Physical Exam: VS:  BP 122/70   Pulse 92   Ht 5\' 8"  (1.727 m)   Wt 200 lb 12.8 oz (91.1 kg)   SpO2 96%   BMI 30.53 kg/m , BMI Body mass index is 30.53 kg/m.  Wt Readings from Last 3 Encounters:  03/05/22 200 lb 12.8 oz (91.1 kg)  02/26/22 201 lb (91.2 kg)  01/07/22 193 lb (87.5 kg)    General: Patient appears comfortable at rest. HEENT: Conjunctiva and lids normal. Neck: Tracheostomy in place, no carotid bruits. Lungs: No rales or wheezing, nonlabored at rest. Cardiac: Regular rate and rhythm, no S3 or significant systolic murmur, no pericardial rub. Extremities: No pitting edema.  ECG:  An ECG dated 12/03/2021 was personally reviewed today and demonstrated:  Sinus tachycardia with nonspecific ST changes.  Recent Labwork: 11/28/2021: Magnesium 1.9 12/04/2021: ALT 11; AST 12; BUN 10; Creatinine, Ser 0.90; Potassium 4.7; Sodium 143 01/02/2022: Hemoglobin 14.7; Platelets 226     Component Value Date/Time   CHOL 130 04/23/2021 1041   TRIG 135 04/23/2021 1041   HDL 53 04/23/2021 1041   CHOLHDL 2.5 04/23/2021 1041   VLDL 27 04/23/2021 1041   LDLCALC 50 04/23/2021 1041    Other  Studies Reviewed Today:  Lexiscan Myoview 01/25/2021:   Findings are consistent with prior inferior myocardial infarction. There is no current ischemia. The study is low risk.   No ST deviation was noted.   Left ventricular function is normal. Nuclear stress EF: 50 %. The left ventricular ejection fraction is low normal). End diastolic cavity size is normal.  Echocardiogram 01/25/2021:  1. Left ventricular ejection fraction, by estimation, is 50 to 55%. The  left ventricle has low normal function. The left ventricle has no regional  wall motion abnormalities. There is mild left ventricular hypertrophy.  Left ventricular diastolic  parameters are consistent with Grade I diastolic dysfunction (impaired  relaxation).   2. Right ventricular systolic function is normal. The right ventricular  size is normal.   3. The mitral valve is normal in structure. No evidence of mitral valve  regurgitation. No evidence of mitral stenosis.   4. The aortic valve is tricuspid. Aortic valve regurgitation is not  visualized. No aortic stenosis is present.   5. IVC is small suggesting low RA pressure and hypovolemia.   Assessment and Plan:  1.  CAD status post DES to the RCA in 2020, no active angina or nitroglycerin use at this time.  Follow-up Myoview in September of last year was low risk.  ECG today shows sinus rhythm with nonspecific ST changes.  Plan to continue aspirin, Crestor, and as needed nitroglycerin.  2.  Mixed hyperlipidemia, follow-up FLP on Crestor.  Last LDL was 50.  3.  COPD with chronic hypoxic respiratory failure and tracheostomy, following with Dr. Sherene Sires.  Medication Adjustments/Labs and Tests Ordered: Current medicines are reviewed at length with the patient today.  Concerns regarding medicines are outlined above.   Tests Ordered: Orders Placed This Encounter  Procedures   Lipid Profile   EKG 12-Lead    Medication Changes: No orders of the defined types were placed in this  encounter.   Disposition:  Follow up  6 months.  Signed, Jonelle Sidle,  MD, [facility] 03/05/2022 12:01 PM    [facility] [address] [place], [place] [address] Phone: [phone]; Fax: [phone]

## 2022-03-08 ENCOUNTER — Other Ambulatory Visit: Payer: Self-pay | Admitting: Internal Medicine

## 2022-03-09 ENCOUNTER — Encounter: Payer: Self-pay | Admitting: Cardiology

## 2022-03-12 ENCOUNTER — Other Ambulatory Visit: Payer: Self-pay | Admitting: Internal Medicine

## 2022-03-12 ENCOUNTER — Ambulatory Visit (INDEPENDENT_AMBULATORY_CARE_PROVIDER_SITE_OTHER): Payer: Medicaid Other | Admitting: Internal Medicine

## 2022-03-12 ENCOUNTER — Encounter: Payer: Self-pay | Admitting: Internal Medicine

## 2022-03-12 DIAGNOSIS — J449 Chronic obstructive pulmonary disease, unspecified: Secondary | ICD-10-CM

## 2022-03-12 DIAGNOSIS — J9611 Chronic respiratory failure with hypoxia: Secondary | ICD-10-CM | POA: Diagnosis not present

## 2022-03-12 MED ORDER — PREDNISONE 10 MG PO TABS: ORAL_TABLET | ORAL | 0 refills | Status: DC

## 2022-03-12 MED ORDER — BETHANECHOL CHLORIDE 25 MG PO TABS: 25.0000 mg | ORAL_TABLET | Freq: Four times a day (QID) | ORAL | 0 refills | Status: DC

## 2022-03-12 NOTE — Patient Instructions (Signed)
If breathing worse > Prednisone 10 mg take  4 each am x 2 days,   2 each am x 2 days,  1 each am x 2 days and stop   Maximum mucinex 1200 mg twice daily and check with [place] for a different humidity if available and also check with Dr Redmond Baseman if this continues to be a problem   Please schedule a follow up visit in  6 months but call sooner if needed

## 2022-03-12 NOTE — Progress Notes (Unsigned)
Chester Holstein, male    DOB: [birth year],    MRN: [id number]   Brief patient profile:  56  yowm illiterate though says finished 7th grade with asthma since in his 9s  just MM/quit smoking 08/2020 on disability since last PFT's ? 2017/18 relocated to [facility] summer 2021 and referred to pulmonary clinic in [place]  01/24/2020 by Bonna Gains NP.  Admit date: 11/04/2019 Discharge date: 11/10/2019   Admitted From: Home Disposition: Home   Recommendations for Outpatient Follow-up:  Follow up with PCP in 1-2 weeks Please obtain BMP/CBC in one week   Home Health: Equipment/Devices: Home oxygen at 3 L, nebulizer machine   Discharge Condition: Stable CODE STATUS: Full code Diet recommendation: Heart healthy   Brief/Interim Summary: Koree Schopf is a 56 y.o. y.o. male with a history of CAD, MI, COPD.  Patient just recently moved to [place] from [place].  He has no medications and has not seen a physician here.  He started having shortness of breath about 3 weeks ago which has been increasing.  He has been having cough with some purulent sputum production.  He had a relative give him some steroids over the past few days, without too much improvement.  No other palliating or provoking factors.  Denies fevers, chills, nausea, vomiting.   Emergency Department Course: Patient started on BiPAP and gradually weaned to nasal cannula.  Initial saturations were in the 80s.  Improved with steroids and BiPAP.  Remained stable on nasal cannula.   Discharge Diagnoses:  Principal Problem:   Acute respiratory failure with hypoxia ([place]) Active Problems:   Coronary artery disease   COPD with acute exacerbation (HCC)   Acute on chronic hypoxic respiratory failure secondary to acute COPD exacerbation Presented with hypoxia with O2 saturation in the 80s initially requiring NIV then transitioned to 3-4 L  -previously on 3L at home but has not used since Oct 2020 -Continue brovana and  pulmicort -Continue hypertonic saline nebs -continue duonebs -He was treated with IV steroids which have since been transitioned to prednisone taper -Personally reviewed chest x-ray which showed no lobular pulmonary infiltrates to suggest pneumonia -Maintain O2 saturation greater than 88 to 90% -TOC consulted to assist with PCP and pulmonology referrals -Currently, he can ambulate comfortably on 3 L of oxygen which appears to be near his baseline   -Acute COPD exacerbation Ongoing tobacco use; tobacco cessation counseling at bedside. COVID-19 negative Treated with IV steroids which was subsequently transitioned to prednisone taper Continue management as stated above -Continue Symbicort on discharge -Continue albuterol nebs as needed, provided with nebulizer machine   Hyperglycemia/impaired glucose tolerance Presented with elevated serum glucose Hemoglobin A1c 6.3 on 619 1221 Monitored on sliding scale insulin   Coronary artery disease status post PCI/history of MI Denies any anginal symptoms at the time of this exam Continue Brilinta  Not on statin, obtain lipid panel   Tobacco use disorder Tobacco cessation counseling Nicotine patch   BPH Continue terazosin Monitor urine output    History of Present Illness  01/24/2020  Pulmonary/ 1st office eval/ Dorene Bruni / [place] [facility]  Chief Complaint  Patient presents with   Pulmonary Consult    Referred by Cherly Beach, NP.  Pt states hx of COPD. Pt c/o increased SOB since Winter 2020. He sometimes gets winded walking room to room at home. He is using albuterol inhaler and neb both 1-2 x per day.   Dyspnea:  [employer] walking / walk from [facility] space (sisters) / no steps but  there are times he can't get across the room s sob  Cough: better since quit  Sleep: flat bed/ 1 pillow  - no resp  SABA use:neb  once or twice daily "can't make it thru the day" and also using 2 different forms of hfa  02  Prn  rec Plan A = Automatic = Always=     Symbicort 160 Take 2 puffs first thing in am and then another 2 puffs about 12 hours later.  Work on inhaler technique: Plan B = Backup (to supplement plan A, not to replace it) Only use your albuterol inhaler as a rescue medication Plan C = Crisis (instead of Plan B but only if Plan B stops working) - only use your albuterol nebulizer if you first try Plan B and it fails to help  Make sure you check your oxygen saturations at highest level of activity  Please schedule a follow up visit in 3 months with pfts on return      05/31/2020  f/u ov/[place] office/Charlsie Fleeger re: needs to go to lab / did stop smoking Chief Complaint  Patient presents with   Follow-up    Patient feels like breathing is the same since last visit. Cough but states that it is hard to get up mucus.   Dyspnea: basically housebound/ walks dog to porch / 25 ft bd Cough: non productive, last cig jun 2021 much better since then  Sleeping: flat bed/ no bipap/ wakes up feeling find SABA use: rarely neb,  Twice a daily proair never pre challenges or rechallenges  02: has it not using / not tracking sats with ex  Rec Plan A = Automatic = Always=    Symbicort 160 Take 2 puffs first thing in am and then another 2 puffs about 12 hours later.  Work on inhaler technique:     Plan B = Backup (to supplement plan A, not to replace it) Only use your albuterol inhaler as a rescue medication  Plan C = Crisis (instead of Plan B but only if Plan B stops working) - only use your albuterol nebulizer if you first try Plan B and it fails to help > ok to use the nebulizer up to every 4 hours but if start needing it regularly call for immediate appointment Make sure you check your oxygen saturations at highest level of activity to be sure it stays over 90%  Please remember to go to the lab and x-ray department at [facility]   for your tests - we will call you with the results when they are available. Please schedule a follow up visit in 3  months with pfts on return    Admitted August 17 2020 to [facility]  p neck fx > [facility] > d/c home with trach and PEG and f/u by Dr Redmond Baseman who rec laser surgery on trach     12/24/2020  f/u ov/[place] office/Maeby Vankleeck re: GOLD IV post hosp  Chief Complaint  Patient presents with   Follow-up    Patient states that he would rather have Symbicort over [facility] inhaler feels like it does not work as good. Patient was admitted on 10/09/20. Sister states he will need surgical clearance today for his ENT Dr. Redmond Baseman  Dyspnea:  mb and back 100 ft flat s 02  Cough: clear phlegm  Sleeping: bed is flat / on back one pillow SABA use: neb twice daily  02: 4lpm  Covid status: 2 vax  Rec Ok to change [place]  to symbicort 160 Take 2 puffs first thing in am and then another 2 puffs about 12 hours later.  You are cleared for surgery > Redmond Baseman  02/06/21 balloon dilatation/ no laser      06/19/2021  f/u ov/[place] office/Derico Mitton re: trach dep s/p multiple trauma  maint on symbicort 160 but also taking dulera 200   Chief Complaint  Patient presents with   Follow-up    Patient had a lot of mucus with prednisone, so stopped taking.   Dyspnea:  sits on bed most days extremely sedentary so not really limited by sob   Cough: less mucus production now/ min mucoid  Sleeping: on flat bed on either back or  side s resp cc  SABA use: not using correctly  02: 1-4lpm to keep sats > 90%  Covid status: vax x 3  Rec Stop dulera  Plan A = Automatic = Always=    symbicort 160 Take 2 puffs first thing in am and then another 2 puffs about 12 hours later.  Plan B = Backup (to supplement plan A, not to replace it) Only use your albuterol inhaler as a rescue medication  Plan C = Crisis (instead of Plan B but only if Plan B stops working) - only use your albuterol nebulizer if you first try Plan B   Please schedule a follow up visit in 6 months but call sooner if needed     12/18/2021  f/u ov/[place] office/Pinky Ravan re: trach dep s/p  multiple traua maint on trach collar 3lpm  and symb 160 2bid  Chief Complaint  Patient presents with   Follow-up    Breathing is about the same   Dyspnea:  very sedentary / most the day sits on side of bed  Cough: clear mucus mod thick white Sleeping: hosp bed head up just a little  SABA use: neb qid / last neb 4 h  02: 3lpm  Rec Make sure you check your oxygen saturation  AT  your highest level of activity (not after you stop)   to be sure it stays over 90%   For cough/ congestion > mucinex 1200 mg every 12 hours as needed  Try substitute the neb for the symbicort =  budesonide / performist twice daily then just use albuterol in between if you really need it.     03/12/2022  f/u ov/[place] office/Matty Deamer re: 02 dep/ trach dep  maint on trach collar 3lpm   On formoterol no bud due to shortage  Chief Complaint  Patient presents with   Follow-up    Breathing is doing some days, not so good other days.  Pts sister has picture of mucus coughed up to show Dr. Melvyn Novas today   Dyspnea:  66 ft / spends a lot of time sitting on side of bed - 4-5 lpm with ex to keep > 90%  Cough: sometimes quite thick brown sev days prior to OV   Sleeping: hosp bed at 30 degrees  SABA use: occ hfa  02: 3lpm  Covid status: vax x 4  - not the last one     No obvious day to day or daytime variability or assoc excess/ purulent sputum or mucus plugs or hemoptysis or cp or chest tightness, subjective wheeze or overt sinus or hb symptoms.   *** without nocturnal  or early am exacerbation  of respiratory  c/o's or need for noct saba. Also denies any obvious fluctuation of symptoms with weather or environmental changes or other aggravating  or alleviating factors except as outlined above   No unusual exposure hx or h/o childhood pna/ asthma or knowledge of premature birth.  Current Allergies, Complete Past Medical History, Past Surgical History, Family History, and Social History were reviewed in [facility] record.  ROS  The following are not active complaints unless bolded Hoarseness, sore throat, dysphagia, dental problems, itching, sneezing,  nasal congestion or discharge of excess mucus or purulent secretions, ear ache,   fever, chills, sweats, unintended wt loss or wt gain, classically pleuritic or exertional cp,  orthopnea pnd or arm/hand swelling  or leg swelling, presyncope, palpitations, abdominal pain, anorexia, nausea, vomiting, diarrhea  or change in bowel habits or change in bladder habits, change in stools or change in urine, dysuria, hematuria,  rash, arthralgias, visual complaints, headache, numbness, weakness or ataxia or problems with walking or coordination,  change in mood or  memory.        Current Meds  Medication Sig   acetaminophen (TYLENOL) 325 MG tablet Take 2 tablets (650 mg total) by mouth every 6 (six) hours as needed for mild pain or moderate pain.   aspirin 81 MG chewable tablet Chew 1 tablet (81 mg total) by mouth daily.   bethanechol (URECHOLINE) 25 MG tablet TAKE 2 TABLETS BY MOUTH (4) TIMES DAILY   buPROPion (WELLBUTRIN XL) 300 MG 24 hr tablet TAKE 1 TABLET BY MOUTH ONCE DAILY.   busPIRone (BUSPAR) 10 MG tablet TAKE ONE TABLET BY MOUTH 3 TIMES A DAY   Calcium Carb-Cholecalciferol (CALCIUM 600/VITAMIN D) 600-400 MG-UNIT CHEW Chew 1 each by mouth daily.   cephALEXin (KEFLEX) 500 MG capsule Take 1 capsule (500 mg total) by mouth 3 (three) times daily.   Cholecalciferol (VITAMIN D3) 125 MCG (5000 UT) CAPS Take 5,000 Units by mouth daily.   clonazePAM (KLONOPIN) 0.5 MG tablet TAKE 1 TABLET BY MOUTH AT BEDTIME.   docusate sodium (COLACE) 100 MG capsule Take 200 mg by mouth daily as needed for moderate constipation.   Docusate Sodium (STOOL SOFTENER LAXATIVE PO) Take 200 mg by mouth daily. 2-4 tablets   folic acid (FOLVITE) 1 MG tablet TAKE ONE TABLET BY MOUTH ONCE DAILY.   formoterol (PERFOROMIST) 20 MCG/2ML nebulizer solution Take 2 mLs (20 mcg total)  by nebulization 2 (two) times daily. Use in nebulizer twice daily perfectly regularly   formoterol (PERFOROMIST) 20 MCG/2ML nebulizer solution Take 2 mLs (20 mcg total) by nebulization 2 (two) times daily.   Misc. Devices Optometrist) MISC For transporting to and from Doctors appointments   montelukast (SINGULAIR) 10 MG tablet TAKE ONE TABLET BY MOUTH AT BEDTIME   nitroGLYCERIN (NITROSTAT) 0.4 MG SL tablet Place 1 tablet (0.4 mg total) under the tongue every 5 (five) minutes as needed for chest pain.   ondansetron (ZOFRAN) 4 MG tablet Take 1 tablet (4 mg total) by mouth every 8 (eight) hours as needed for nausea or vomiting.   oxyCODONE (OXY IR/ROXICODONE) 5 MG immediate release tablet Take 1 tablet (5 mg total) by mouth 4 (four) times daily as needed for moderate pain.   oxyCODONE ER (XTAMPZA ER) 13.5 MG C12A Take 1 capsule by mouth every 12 (twelve) hours.   OXYGEN Inhale 3 L into the lungs continuous.   pantoprazole (PROTONIX) 40 MG tablet TAKE ONE TABLET BY MOUTH ONCE DAILY.   polyethylene glycol powder (GLYCOLAX/MIRALAX) 17 GM/SCOOP powder Take 255 g by mouth daily.   QUEtiapine (SEROQUEL) 300 MG tablet TAKE (1/2) A TABLET BY MOUTH TWICE A  DAY, AND (1) TABLET AT BEDTIME.   rosuvastatin (CRESTOR) 5 MG tablet TAKE ONE TABLET BY MOUTH ONCE DAILY   tamsulosin (FLOMAX) 0.4 MG CAPS capsule TAKE (1) CAPSULE BY MOUTH TWICE DAILY.   terazosin (HYTRIN) 5 MG capsule TAKE ONE CAPSULE BY MOUTH ONCE DAILY.   UNABLE TO FIND Med Name: Bedside Table   VENTOLIN HFA 108 (90 Base) MCG/ACT inhaler INHALE 1 OR 2 PUFFS INTO THE LUNGS EVERY 4 HOURS AS NEEDED FOR WHEEZING OR SHORTNESS OF BREATH                   Past Medical History:  Diagnosis Date   Acute respiratory failure with hypoxia ([place]) 11/04/2019   Anxiety    Arthritis    COPD (chronic obstructive pulmonary disease) (HCC)    COPD with acute exacerbation ([place]) 11/04/2019   Coronary artery disease    Depression    Emphysema of lung (HCC)     Hypertension    Myocardial infarct (HCC)    Oxygen deficiency       Objective:   wts  03/12/2022     ***  12/18/2021        194  06/19/2021        182 12/24/2020        181  05/31/20 194 lb 12.8 oz (88.4 kg)  03/06/20 186 lb (84.4 kg)  01/27/20 186 lb (84.4 kg)     Vital signs reviewed  03/12/2022  - Note at rest 02 sats  ***% on ***   General appearance:    ***   Mild barr***      I personally reviewed images and agree with radiology impression as follows:  12/25/21 CXR:   pa and lateral   Minimal right basilar atelectasis.    Assessment

## 2022-03-13 ENCOUNTER — Encounter: Payer: Self-pay | Admitting: Internal Medicine

## 2022-03-13 ENCOUNTER — Other Ambulatory Visit: Payer: Self-pay | Admitting: *Deleted

## 2022-03-13 ENCOUNTER — Other Ambulatory Visit: Payer: Self-pay | Admitting: Internal Medicine

## 2022-03-13 MED ORDER — BETHANECHOL CHLORIDE 25 MG PO TABS: 50.0000 mg | ORAL_TABLET | Freq: Four times a day (QID) | ORAL | 5 refills | Status: DC

## 2022-03-13 NOTE — Assessment & Plan Note (Signed)
Quit smoking 08/2020/ MM - 01/24/2020  After extensive coaching inhaler device,  effectiveness =    75% (short Ti) > change symb 80 to 160 2bid  -  PFT's   05/01/20   FEV1 0.97 (27 % ) ratio 0.30  p 1 % improvement from saba p symbicort prior to study with DLCO  11.59 (43%) corrects to 2.0 (45%)  for alv volume and FV curve severe airflow obst with 35% improvement in FVC p saba  - 05/31/2020  After extensive coaching inhaler device,  effectiveness =    Short Ti, prostate problems so rx symbicort 160 2bid and prn saba -  05/31/2020   Walked RA  approx   300 ft  @ moderate pace  stopped due to  Sob at end but sats still 94%    - Labs ordered 12/24/2020  :    alpha one AT phenotype  MM level 154   Well compensated with main issue mucus plugs from lack of humidity for trach so rec max mucinex and check with ent/02 dme for alternatives to present system as he says it's too wet   No change pulmonary rx x add pred x 6 d prn flare of wheeze off budesonide

## 2022-03-13 NOTE — Assessment & Plan Note (Signed)
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 Patient wanted to stop walk after150 e due to SOB and being unsteady with lowest sats 94%   Adequate control on present rx, reviewed in detail with pt > no change in rx needed           Each maintenance medication was reviewed in detail including emphasizing most importantly the difference between maintenance and prns and under what circumstances the prns are to be triggered using an action plan format where appropriate.  Total time for H and P, chart review, counseling, reviewing neb/02/trach device(s) and generating customized AVS unique to this office visit / same day charting = 31 min

## 2022-03-19 HISTORY — PX: COLONOSCOPY: SHX174

## 2022-03-20 LAB — HM COLONOSCOPY

## 2022-03-21 ENCOUNTER — Encounter: Payer: Self-pay | Admitting: Internal Medicine

## 2022-03-21 ENCOUNTER — Other Ambulatory Visit: Payer: Self-pay | Admitting: Internal Medicine

## 2022-03-21 DIAGNOSIS — J449 Chronic obstructive pulmonary disease, unspecified: Secondary | ICD-10-CM

## 2022-03-25 ENCOUNTER — Other Ambulatory Visit: Payer: Self-pay | Admitting: Internal Medicine

## 2022-03-25 ENCOUNTER — Other Ambulatory Visit: Payer: Self-pay | Admitting: Physical Medicine & Rehabilitation

## 2022-03-25 ENCOUNTER — Other Ambulatory Visit: Payer: Self-pay | Admitting: Cardiology

## 2022-03-25 DIAGNOSIS — F418 Other specified anxiety disorders: Secondary | ICD-10-CM

## 2022-03-25 DIAGNOSIS — J449 Chronic obstructive pulmonary disease, unspecified: Secondary | ICD-10-CM

## 2022-04-07 ENCOUNTER — Telehealth: Payer: Self-pay | Admitting: Registered Nurse

## 2022-04-07 ENCOUNTER — Telehealth: Payer: Self-pay | Admitting: Physical Medicine & Rehabilitation

## 2022-04-07 DIAGNOSIS — S22000G Wedge compression fracture of unspecified thoracic vertebra, subsequent encounter for fracture with delayed healing: Secondary | ICD-10-CM

## 2022-04-07 DIAGNOSIS — G894 Chronic pain syndrome: Secondary | ICD-10-CM

## 2022-04-07 MED ORDER — OXYCODONE HCL 5 MG PO TABS: 5.0000 mg | ORAL_TABLET | Freq: Four times a day (QID) | ORAL | 0 refills | Status: DC | PRN

## 2022-04-07 MED ORDER — XTAMPZA ER 13.5 MG PO C12A: 1.0000 | EXTENDED_RELEASE_CAPSULE | Freq: Two times a day (BID) | ORAL | 0 refills | Status: DC

## 2022-04-07 NOTE — Telephone Encounter (Signed)
Pharmacy requesting November scripts for both Oxycodone and Charles Weeks

## 2022-04-07 NOTE — Telephone Encounter (Signed)
PMP was Reviewed.  Xtampza and Oxycodone prescription had a do not fill till 04/25/2022.  New prescription sent to pharmacy.  This provider spoke with the pharmacist and Ms. [name]

## 2022-04-12 ENCOUNTER — Other Ambulatory Visit: Payer: Self-pay | Admitting: Internal Medicine

## 2022-04-16 ENCOUNTER — Encounter: Payer: Self-pay | Admitting: Internal Medicine

## 2022-04-16 ENCOUNTER — Ambulatory Visit (INDEPENDENT_AMBULATORY_CARE_PROVIDER_SITE_OTHER): Payer: Medicaid Other | Admitting: Internal Medicine

## 2022-04-16 VITALS — BP 109/75 | HR 101 | Ht 68.0 in | Wt 201.6 lb

## 2022-04-16 DIAGNOSIS — Z2821 Immunization not carried out because of patient refusal: Secondary | ICD-10-CM | POA: Diagnosis not present

## 2022-04-16 DIAGNOSIS — Z23 Encounter for immunization: Secondary | ICD-10-CM

## 2022-04-16 DIAGNOSIS — J449 Chronic obstructive pulmonary disease, unspecified: Secondary | ICD-10-CM

## 2022-04-16 DIAGNOSIS — J9611 Chronic respiratory failure with hypoxia: Secondary | ICD-10-CM

## 2022-04-16 DIAGNOSIS — F418 Other specified anxiety disorders: Secondary | ICD-10-CM

## 2022-04-16 DIAGNOSIS — K219 Gastro-esophageal reflux disease without esophagitis: Secondary | ICD-10-CM

## 2022-04-16 DIAGNOSIS — Z0001 Encounter for general adult medical examination with abnormal findings: Secondary | ICD-10-CM | POA: Diagnosis not present

## 2022-04-16 DIAGNOSIS — N401 Enlarged prostate with lower urinary tract symptoms: Secondary | ICD-10-CM

## 2022-04-16 DIAGNOSIS — R338 Other retention of urine: Secondary | ICD-10-CM

## 2022-04-16 MED ORDER — BUPROPION HCL ER (XL) 300 MG PO TB24: 300.0000 mg | ORAL_TABLET | Freq: Every day | ORAL | 5 refills | Status: DC

## 2022-04-16 MED ORDER — MONTELUKAST SODIUM 10 MG PO TABS: 10.0000 mg | ORAL_TABLET | Freq: Every day | ORAL | 3 refills | Status: DC

## 2022-04-16 MED ORDER — BUSPIRONE HCL 10 MG PO TABS: 10.0000 mg | ORAL_TABLET | Freq: Three times a day (TID) | ORAL | 5 refills | Status: DC

## 2022-04-16 MED ORDER — PANTOPRAZOLE SODIUM 40 MG PO TBEC: 40.0000 mg | DELAYED_RELEASE_TABLET | Freq: Every day | ORAL | 1 refills | Status: DC

## 2022-04-16 NOTE — Assessment & Plan Note (Signed)
Well controlled with pantoprazole 

## 2022-04-16 NOTE — Assessment & Plan Note (Signed)
Physical exam as documented. Recent blood tests reviewed from chart. PCV20 vaccine today. Denied flu vaccine.

## 2022-04-16 NOTE — Assessment & Plan Note (Signed)
Well-controlled On Pulmicort, Perforomist and PRN Albuterol F/u with Pulmonology

## 2022-04-16 NOTE — Progress Notes (Signed)
Established Patient Office Visit  Subjective:  Patient ID: Charles Weeks, male    DOB: Dec 20, 1965  Age: 56 y.o. MRN: [id number]  CC:  Chief Complaint  Patient presents with   Annual Exam    CPE     HPI Charles Weeks is a 56 y.o. 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  COPD: He has been placed on Pulmicort and Perforomist for it.  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  He currently denies any fever, chills or wheezing.  Depression with anxiety: He is on Wellbutrin, BuSpar, Seroquel and Klonopin.  Denies any spells of panic/anxiety and insomnia.  He has a hospital bed at home now.  He has a 24-hour caregiver (his niece).     Past Medical History:  Diagnosis Date   Anxiety 01/19/2020   Arthritis    Asthma    Benign prostatic hyperplasia with weak urinary stream 12/08/2019   BPH (benign prostatic hyperplasia) 08/17/2020   Chronic respiratory failure with hypoxia ([place]) 01/24/2020   Has home 02 but not using as of 01/24/2020  -  01/24/2020   Walked RA  approx   200 ft  @ slow pace  stopped due to  Dizzy with sats still 95%      Cigarette smoker 01/24/2020   Stopped regular smoking 10/2019 but still "now and then" as of 01/24/2020    COPD (chronic obstructive pulmonary disease) ([place]) 08/17/2020   gold   COPD GOLD ?     Quit smoking 10/2019  - Labs ordered 01/24/2020  :  allergy profile   alpha one AT phenotype   - 01/24/2020  After extensive coaching inhaler device,  effectiveness =    75% (short Ti) > change symb 80 to 160 2bid      Coronary artery disease 06/08/2018   DES to RCA, [facility] [place], [place])   Depression, major, single episode, moderate ([place]) 01/19/2020   Dyspnea    Encounter for screening for malignant neoplasm of colon 12/08/2019   Essential hypertension 12/08/2019   History of MI (myocardial  infarction) 01/19/2020   HTN (hypertension) 08/17/2020   Myocardial infarct ([place]) 2019   Pneumonia    Positive colorectal cancer screening using Cologuard test     Past Surgical History:  Procedure Laterality Date   BALLOON DILATION N/A 02/06/2021   Procedure: BALLOON DILATION;  Surgeon: Melida Quitter, MD;  Location: [place];  Service: ENT;  Laterality: N/A;   BOWEL RESECTION  2006   MICROLARYNGOSCOPY WITH LASER AND BALLOON DILATION N/A 02/06/2021   Procedure: SUSPENDED MICRO DIRECT LARYNGOSCOPY;  Surgeon: Melida Quitter, MD;  Location: [place];  Service: ENT;  Laterality: N/A;   PEG PLACEMENT N/A 09/03/2020   Procedure: PERCUTANEOUS ENDOSCOPIC GASTROSTOMY (PEG) PLACEMENT;  Surgeon: Georganna Skeans, MD;  Location: [place];  Service: General;  Laterality: N/A;   PERCUTANEOUS TRACHEOSTOMY N/A 09/03/2020   Procedure: PERCUTANEOUS TRACHEOSTOMY USING 6 SHILEY;  Surgeon: Georganna Skeans, MD;  Location: [place];  Service: General;  Laterality: N/A;   SKIN GRAFT     TONSILLECTOMY AND ADENOIDECTOMY      Family History  Problem Relation Age of Onset   Heart disease Mother    Heart disease Father    Diabetes Sister     Social History   Socioeconomic History   Marital status: Single    Spouse name: Not on  file   Number of children: 2   Years of education: Not on file   Highest education level: 7th grade  Occupational History   Not on file  Tobacco Use   Smoking status: Former    Packs/day: 1.50    Types: Cigarettes    Quit date: 08/17/2020    Years since quitting: 1.6   Smokeless tobacco: Never  Vaping Use   Vaping Use: Never used  Substance and Sexual Activity   Alcohol use: Not Currently   Drug use: Not Currently   Sexual activity: Not on file  Other Topics Concern   Not on file  Social History Narrative   ** Merged History Encounter **       Lives with sister  Both daughters live close by   Sister has dog   Enjoys: shooting pool, fishing, Retail banker  Diet: eats all food  groups Caffeine: pop-dr pepper 2 liter  Water: 4-6 cups   Wears seat belt  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 No weapons        Social Determinants of Health   Financial Resource Strain: Low Risk  (12/08/2019)   Overall Financial Resource Strain (CARDIA)    Difficulty of Paying Living Expenses: Not hard at all  Food Insecurity: No Food Insecurity (12/08/2019)   Hunger Vital Sign    Worried About Running Out of Food in the Last Year: Never true    [place] in the Last Year: Never true  Transportation Needs: No Transportation Needs (12/08/2019)   PRAPARE - Hydrologist (Medical): No    Lack of Transportation (Non-Medical): No  Physical Activity: Inactive (12/08/2019)   Exercise Vital Sign    Days of Exercise per Week: 0 days    Minutes of Exercise per Session: 0 min  Stress: No Stress Concern Present (12/08/2019)   [place]    Feeling of Stress : Not at all  Social Connections: Socially Isolated (12/08/2019)   Social Connection and Isolation Panel [NHANES]    Frequency of Communication with Friends and Family: More than three times a week    Frequency of Social Gatherings with Friends and Family: More than three times a week    Attends Religious Services: Never    Marine scientist or Organizations: No    Attends Archivist Meetings: Never    Marital Status: Divorced  Human resources officer Violence: Not At Risk (12/08/2019)   Humiliation, Afraid, Rape, and Kick questionnaire    Fear of Current or Ex-Partner: No    Emotionally Abused: No    Physically Abused: No    Sexually Abused: No    Outpatient Medications Prior to Visit  Medication Sig Dispense Refill   acetaminophen (TYLENOL) 325 MG tablet Take 2 tablets (650 mg total) by mouth every 6 (six) hours as needed for mild pain or moderate pain.     aspirin 81 MG chewable tablet Chew 1 tablet  (81 mg total) by mouth daily.     bethanechol (URECHOLINE) 25 MG tablet Take 2 tablets (50 mg total) by mouth 4 (four) times daily. 240 tablet 5   Calcium Carb-Cholecalciferol (CALCIUM 600/VITAMIN D) 600-400 MG-UNIT CHEW Chew 1 each by mouth daily.     Cholecalciferol (VITAMIN D3) 125 MCG (5000 UT) CAPS Take 5,000 Units by mouth daily.     clonazePAM (KLONOPIN) 0.5 MG tablet TAKE 1 TABLET BY MOUTH  AT BEDTIME. 30 tablet 3   Docusate Sodium (STOOL SOFTENER LAXATIVE PO) Take 200 mg by mouth daily. 2-4 tablets     folic acid (FOLVITE) 1 MG tablet TAKE ONE TABLET BY MOUTH ONCE DAILY. 90 tablet 3   formoterol (PERFOROMIST) 20 MCG/2ML nebulizer solution Take 2 mLs (20 mcg total) by nebulization 2 (two) times daily. Use in nebulizer twice daily perfectly regularly 120 mL 11   formoterol (PERFOROMIST) 20 MCG/2ML nebulizer solution Take 2 mLs (20 mcg total) by nebulization 2 (two) times daily. 120 mL 11   Misc. Devices (TRANSPORT CHAIR) MISC For transporting to and from Doctors appointments 1 each 0   nitroGLYCERIN (NITROSTAT) 0.4 MG SL tablet Place 1 tablet (0.4 mg total) under the tongue every 5 (five) minutes as needed for chest pain. 25 tablet 2   ondansetron (ZOFRAN) 4 MG tablet Take 1 tablet (4 mg total) by mouth every 8 (eight) hours as needed for nausea or vomiting. 20 tablet 0   oxyCODONE (OXY IR/ROXICODONE) 5 MG immediate release tablet Take 1 tablet (5 mg total) by mouth 4 (four) times daily as needed for moderate pain. 120 tablet 0   oxyCODONE ER (XTAMPZA ER) 13.5 MG C12A Take 1 capsule by mouth every 12 (twelve) hours. 60 capsule 0   OXYGEN Inhale 3 L into the lungs continuous.     polyethylene glycol powder (GLYCOLAX/MIRALAX) 17 GM/SCOOP powder Take 255 g by mouth daily. 850 g 5   predniSONE (DELTASONE) 10 MG tablet Take  4 each am x 2 days,   2 each am x 2 days,  1 each am x 2 days and stop 14 tablet 0   QUEtiapine (SEROQUEL) 300 MG tablet TAKE (1/2) A TABLET BY MOUTH TWICE A DAY, AND (1)  TABLET AT BEDTIME. 60 tablet 0   rosuvastatin (CRESTOR) 5 MG tablet TAKE ONE TABLET BY MOUTH ONCE DAILY 90 tablet 3   tamsulosin (FLOMAX) 0.4 MG CAPS capsule TAKE (1) CAPSULE BY MOUTH TWICE DAILY. 60 capsule 0   UNABLE TO FIND Med Name: Bedside Table 1 each 0   VENTOLIN HFA 108 (90 Base) MCG/ACT inhaler INHALE 1 OR 2 PUFFS INTO THE LUNGS EVERY 4 HOURS AS NEEDED FOR WHEEZING OR SHORTNESS OF BREATH 18 g 0   buPROPion (WELLBUTRIN XL) 300 MG 24 hr tablet TAKE 1 TABLET BY MOUTH ONCE DAILY. 30 tablet 0   busPIRone (BUSPAR) 10 MG tablet TAKE ONE TABLET BY MOUTH 3 TIMES A DAY 90 tablet 0   cephALEXin (KEFLEX) 500 MG capsule Take 1 capsule (500 mg total) by mouth 3 (three) times daily. 15 capsule 0   docusate sodium (COLACE) 100 MG capsule Take 200 mg by mouth daily as needed for moderate constipation.     montelukast (SINGULAIR) 10 MG tablet TAKE ONE TABLET BY MOUTH AT BEDTIME 90 tablet 3   pantoprazole (PROTONIX) 40 MG tablet TAKE ONE TABLET BY MOUTH ONCE DAILY. 90 tablet 0   terazosin (HYTRIN) 5 MG capsule TAKE ONE CAPSULE BY MOUTH ONCE DAILY. 30 capsule 11   budesonide (PULMICORT) 0.25 MG/2ML nebulizer solution 2.5 mg  twice daily with performist (Patient not taking: Reported on 03/12/2022) 120 mL 12   No facility-administered medications prior to visit.    Allergies  Allergen Reactions   Ciprofloxacin Hives    ROS Review of Systems  Constitutional:  Negative for chills and fever.  HENT:  Negative for congestion and sore throat.   Eyes:  Negative for pain and discharge.  Respiratory:  Negative  for cough and shortness of breath.   Cardiovascular:  Negative for chest pain and palpitations.  Gastrointestinal:  Positive for constipation and nausea. Negative for diarrhea and vomiting.  Endocrine: Negative for polydipsia and polyuria.  Genitourinary:  Positive for difficulty urinating. Negative for dysuria and hematuria.  Musculoskeletal:  Positive for arthralgias, back pain, gait problem,  myalgias and neck pain.  Skin:        Left great toe pain and swelling  Neurological:  Negative for dizziness, weakness, numbness and headaches.  Psychiatric/Behavioral:  Positive for decreased concentration, dysphoric mood and sleep disturbance. Negative for behavioral problems.       Objective:    Physical Exam Vitals reviewed.  Constitutional:      General: He is not in acute distress.    Appearance: He is not diaphoretic.  HENT:     Head: Normocephalic and atraumatic.     Nose: Nose normal.     Mouth/Throat:     Mouth: Mucous membranes are moist.  Eyes:     General: No scleral icterus.    Extraocular Movements: Extraocular movements intact.  Cardiovascular:     Rate and Rhythm: Normal rate and regular rhythm.     Pulses: Normal pulses.     Heart sounds: Normal heart sounds. No murmur heard. Pulmonary:     Breath sounds: Normal breath sounds. No wheezing or rales.     Comments: Tracheostomy in place Musculoskeletal:     Cervical back: Neck supple. No tenderness.     Right lower leg: No edema.     Left lower leg: No edema.  Skin:    General: Skin is warm.  Neurological:     General: No focal deficit present.     Mental Status: He is alert and oriented to person, place, and time.     Sensory: No sensory deficit.     Motor: No weakness.  Psychiatric:        Mood and Affect: Mood normal.        Behavior: Behavior normal.     BP 109/75 (BP Location: Left Arm, Patient Position: Sitting, Cuff Size: Large)   Pulse (!) 101   Ht _0  (1.727 m)   Wt 201 lb 9.6 oz (91.4 kg)   SpO2 95%   BMI 30.65 kg/m  Wt Readings from Last 3 Encounters:  04/16/22 201 lb 9.6 oz (91.4 kg)  03/12/22 203 lb (92.1 kg)  03/05/22 200 lb 12.8 oz (91.1 kg)    No results found for: "TSH" Lab Results  Component Value Date   WBC 8.6 01/02/2022   HGB 14.7 01/02/2022   HCT 43.6 01/02/2022   MCV 90 01/02/2022   PLT 226 01/02/2022   Lab Results  Component Value Date   NA 143 12/04/2021    K 4.7 12/04/2021   CO2 28 12/04/2021   GLUCOSE 135 (H) 12/04/2021   BUN 10 12/04/2021   CREATININE 0.90 12/04/2021   BILITOT 0.2 12/04/2021   ALKPHOS 121 12/04/2021   AST 12 12/04/2021   ALT 11 12/04/2021   PROT 6.9 12/04/2021   ALBUMIN 4.4 12/04/2021   CALCIUM 9.5 12/04/2021   ANIONGAP 4 (L) 11/28/2021   EGFR 100 12/04/2021   Lab Results  Component Value Date   CHOL 137 03/05/2022   Lab Results  Component Value Date   HDL 42 03/05/2022   Lab Results  Component Value Date   LDLCALC 35 03/05/2022   Lab Results  Component Value Date   TRIG 298 (H)  03/05/2022   Lab Results  Component Value Date   CHOLHDL 3.3 03/05/2022   Lab Results  Component Value Date   HGBA1C 6.3 (H) 11/22/2021      Assessment & Plan:   Problem List Items Addressed This Visit       Respiratory   COPD GOLD 4/ 02 dep     Well-controlled On Pulmicort, Perforomist and PRN Albuterol F/u with Pulmonology      Relevant Medications   montelukast (SINGULAIR) 10 MG tablet   Chronic respiratory failure with hypoxia (HCC)    Due to COPD Home O2 PRN Well-controlled with Pulmicort, Perforomist and PRN Albuterol F/u with Pulmonology        Digestive   GERD (gastroesophageal reflux disease)    Well controlled with pantoprazole      Relevant Medications   pantoprazole (PROTONIX) 40 MG tablet     Genitourinary   Urinary retention due to benign prostatic hyperplasia    Well controlled with tamsulosin and bethanechol DC terazosin is he is already taking tamsulosin        Other   Depression with anxiety    Well-controlled with Seroquel, Wellbutrin, Buspar and Clonazepam Tried Psychiatry referral, but was not able to see them Klonopin currently refilled by [facility] Will try to taper BuSpar, BID for now      Relevant Medications   busPIRone (BUSPAR) 10 MG tablet   buPROPion (WELLBUTRIN XL) 300 MG 24 hr tablet   Encounter for general adult medical examination with abnormal findings -  Primary    Physical exam as documented. Recent blood tests reviewed from chart. PCV20 vaccine today. Denied flu vaccine.      Other Visit Diagnoses     Influenza vaccination declined       Immunization due       Relevant Orders   Pneumococcal conjugate vaccine 20-valent (Completed)       Meds ordered this encounter  Medications   busPIRone (BUSPAR) 10 MG tablet    Sig: Take 1 tablet (10 mg total) by mouth 3 (three) times daily.    Dispense:  90 tablet    Refill:  5   buPROPion (WELLBUTRIN XL) 300 MG 24 hr tablet    Sig: Take 1 tablet (300 mg total) by mouth daily.    Dispense:  30 tablet    Refill:  5   pantoprazole (PROTONIX) 40 MG tablet    Sig: Take 1 tablet (40 mg total) by mouth daily.    Dispense:  90 tablet    Refill:  1   montelukast (SINGULAIR) 10 MG tablet    Sig: Take 1 tablet (10 mg total) by mouth at bedtime.    Dispense:  90 tablet    Refill:  3    Follow-up: Return in about 4 months (around 08/15/2022) for MDD and COPD.    Lindell Spar, MD

## 2022-04-16 NOTE — Assessment & Plan Note (Addendum)
Due to COPD Home O2 PRN Well-controlled with Pulmicort, Perforomist and PRN Albuterol F/u with Pulmonology

## 2022-04-16 NOTE — Patient Instructions (Addendum)
Please stop taking Terazosin.  Please take Buspirone twice daily only instead of three times.  Please continue taking other medications as prescribed.  Please continue to follow low salt diet and ambulate as tolerated.

## 2022-04-16 NOTE — Assessment & Plan Note (Signed)
Well controlled with tamsulosin and bethanechol DC terazosin is he is already taking tamsulosin

## 2022-04-16 NOTE — Assessment & Plan Note (Addendum)
Well-controlled with Seroquel, Wellbutrin, Buspar and Clonazepam Tried Psychiatry referral, but was not able to see them Klonopin currently refilled by [facility] Will try to taper BuSpar, BID for now

## 2022-04-23 ENCOUNTER — Encounter: Payer: Self-pay | Admitting: Physical Medicine & Rehabilitation

## 2022-04-23 ENCOUNTER — Encounter: Payer: Medicaid Other | Attending: Physical Medicine & Rehabilitation | Admitting: Physical Medicine & Rehabilitation

## 2022-04-23 VITALS — BP 89/60 | HR 94

## 2022-04-23 DIAGNOSIS — K5901 Slow transit constipation: Secondary | ICD-10-CM | POA: Insufficient documentation

## 2022-04-23 DIAGNOSIS — S22000G Wedge compression fracture of unspecified thoracic vertebra, subsequent encounter for fracture with delayed healing: Secondary | ICD-10-CM | POA: Insufficient documentation

## 2022-04-23 DIAGNOSIS — G894 Chronic pain syndrome: Secondary | ICD-10-CM | POA: Insufficient documentation

## 2022-04-23 DIAGNOSIS — G8921 Chronic pain due to trauma: Secondary | ICD-10-CM | POA: Insufficient documentation

## 2022-04-23 DIAGNOSIS — S22008S Other fracture of unspecified thoracic vertebra, sequela: Secondary | ICD-10-CM | POA: Insufficient documentation

## 2022-04-23 MED ORDER — OXYCODONE HCL 5 MG PO TABS: 5.0000 mg | ORAL_TABLET | Freq: Four times a day (QID) | ORAL | 0 refills | Status: DC | PRN

## 2022-04-23 MED ORDER — XTAMPZA ER 13.5 MG PO C12A: 1.0000 | EXTENDED_RELEASE_CAPSULE | Freq: Two times a day (BID) | ORAL | 0 refills | Status: DC

## 2022-04-23 NOTE — Progress Notes (Signed)
Subjective:    Patient ID: Charles Weeks, male    DOB: May 20, 1965, 56 y.o.   MRN: [id number]  HPI  Charles Weeks is here in follow up of his chronic pain. He has been staying in his room for the most part since I last saw him. He leaves the room to get his med and sometimes to look at the window. Pt states he moves more. Sister says otherwise. Anxiety can be an issue sometimes.   He had his colonscopy last month which showed some areas of irritation likely related to his obstipation. A few polyps were removed and were negative. He's taking miralax and senna, colace for bowels.   He remains on his oxycodone and xtampza for his chronic pain. His pain levels remain consistent.       Pain Inventory Average Pain 7 Pain Right Now 7 My pain is constant, sharp, burning, stabbing, and aching  In the last 24 hours, has pain interfered with the following? General activity 7 Relation with others 7 Enjoyment of life 7 What TIME of day is your pain at its worst? morning  and night Sleep (in general) Poor  Pain is worse with: walking, bending, sitting, inactivity, standing, and some activites Pain improves with: rest, heat/ice, therapy/exercise, pacing activities, medication, TENS, and injections Relief from Meds: 5  Family History  Problem Relation Age of Onset   Heart disease Mother    Heart disease Father    Diabetes Sister    Social History   Socioeconomic History   Marital status: Single    Spouse name: Not on file   Number of children: 2   Years of education: Not on file   Highest education level: 7th grade  Occupational History   Not on file  Tobacco Use   Smoking status: Former    Packs/day: 1.50    Types: Cigarettes    Quit date: 08/17/2020    Years since quitting: 1.6   Smokeless tobacco: Never  Vaping Use   Vaping Use: Never used  Substance and Sexual Activity   Alcohol use: Not Currently   Drug use: Not Currently   Sexual activity: Not on file  Other Topics Concern    Not on file  Social History Narrative   ** Merged History Encounter **       Lives with sister  Both daughters live close by   Sister has dog   Enjoys: shooting pool, fishing, Therapist, nutritional  Diet: eats all food groups Caffeine: pop-dr pepper 2 liter  Water: 4-6 cups   Wears seat belt  No driving for him Psychologist, sport and exercise  Fi   re Extinguisher No weapons        Social Determinants of Health   Financial Resource Strain: Low Risk  (12/08/2019)   Overall Financial Resource Strain (CARDIA)    Difficulty of Paying Living Expenses: Not hard at all  Food Insecurity: No Food Insecurity (12/08/2019)   Hunger Vital Sign    Worried About Running Out of Food in the Last Year: Never true    Ran Out of Food in the Last Year: Never true  Transportation Needs: No Transportation Needs (12/08/2019)   PRAPARE - Administrator, Civil Service (Medical): No    Lack of Transportation (Non-Medical): No  Physical Activity: Inactive (12/08/2019)   Exercise Vital Sign    Days of Exercise per Week: 0 days    Minutes of Exercise per Session: 0 min  Stress: No Stress Concern Present (12/08/2019)  [employer] of Occupational Health - Occupational Stress Questionnaire    Feeling of Stress : Not at all  Social Connections: Socially Isolated (12/08/2019)   Social Connection and Isolation Panel [NHANES]    Frequency of Communication with Friends and Family: More than three times a week    Frequency of Social Gatherings with Friends and Family: More than three times a week    Attends Religious Services: Never    Database administrator or Organizations: No    Attends Banker Meetings: Never    Marital Status: Divorced   Past Surgical History:  Procedure Laterality Date   BALLOON DILATION N/A 02/06/2021   Procedure: BALLOON DILATION;  Surgeon: Christia Reading, MD;  Location: [facility] OR;  Service: ENT;  Laterality: N/A;   BOWEL RESECTION  2006   MICROLARYNGOSCOPY WITH LASER AND BALLOON  DILATION N/A 02/06/2021   Procedure: SUSPENDED MICRO DIRECT LARYNGOSCOPY;  Surgeon: Christia Reading, MD;  Location: [facility] OR;  Service: ENT;  Laterality: N/A;   PEG PLACEMENT N/A 09/03/2020   Procedure: PERCUTANEOUS ENDOSCOPIC GASTROSTOMY (PEG) PLACEMENT;  Surgeon: Violeta Gelinas, MD;  Location: [facility] OR;  Service: General;  Laterality: N/A;   PERCUTANEOUS TRACHEOSTOMY N/A 09/03/2020   Procedure: PERCUTANEOUS TRACHEOSTOMY USING 6 SHILEY;  Surgeon: Violeta Gelinas, MD;  Location: [facility] OR;  Service: General;  Laterality: N/A;   SKIN GRAFT     TONSILLECTOMY AND ADENOIDECTOMY     Past Surgical History:  Procedure Laterality Date   BALLOON DILATION N/A 02/06/2021   Procedure: BALLOON DILATION;  Surgeon: Christia Reading, MD;  Location: [facility] OR;  Service: ENT;  Laterality: N/A;   BOWEL RESECTION  2006   MICROLARYNGOSCOPY WITH LASER AND BALLOON DILATION N/A 02/06/2021   Procedure: SUSPENDED MICRO DIRECT LARYNGOSCOPY;  Surgeon: Christia Reading, MD;  Location: [facility] OR;  Service: ENT;  Laterality: N/A;   PEG PLACEMENT N/A 09/03/2020   Procedure: PERCUTANEOUS ENDOSCOPIC GASTROSTOMY (PEG) PLACEMENT;  Surgeon: Violeta Gelinas, MD;  Location: [facility] OR;  Service: General;  Laterality: N/A;   PERCUTANEOUS TRACHEOSTOMY N/A 09/03/2020   Procedure: PERCUTANEOUS TRACHEOSTOMY USING 6 SHILEY;  Surgeon: Violeta Gelinas, MD;  Location: [facility] OR;  Service: General;  Laterality: N/A;   SKIN GRAFT     TONSILLECTOMY AND ADENOIDECTOMY     Past Medical History:  Diagnosis Date   Anxiety 01/19/2020   Arthritis    Asthma    Benign prostatic hyperplasia with weak urinary stream 12/08/2019   BPH (benign prostatic hyperplasia) 08/17/2020   Chronic respiratory failure with hypoxia (HCC) 01/24/2020   Has home 02 but not using as of 01/24/2020  -  01/24/2020   Walked RA  approx   200 ft  @ slow pace  stopped due to  Dizzy with sats still 95%      Cigarette smoker 01/24/2020   Stopped regular smoking 10/2019 but still "now and then" as of 01/24/2020    COPD  (chronic obstructive pulmonary disease) (HCC) 08/17/2020   gold   COPD GOLD ?     Quit smoking 10/2019  - Labs ordered 01/24/2020  :  allergy profile   alpha one AT phenotype   - 01/24/2020  After extensive coaching inhaler device,  effectiveness =    75% (short Ti) > change symb 80 to 160 2bid      Coronary artery disease 06/08/2018   DES to RCA, [facility] [place], [place])   Depression, major, single episode, moderate (HCC) 01/19/2020   Dyspnea    Encounter for screening for  malignant neoplasm of colon 12/08/2019   Essential hypertension 12/08/2019   History of MI (myocardial infarction) 01/19/2020   HTN (hypertension) 08/17/2020   Myocardial infarct (HCC) 2019   Pneumonia    Positive colorectal cancer screening using Cologuard test    BP (!) 89/60   Pulse 94   SpO2 92%   Opioid Risk Score:   Fall Risk Score:  `1  Depression screen [facility] 2/9     04/16/2022   10:55 AM 01/07/2022   10:13 AM 12/25/2021   12:52 PM 12/04/2021    9:56 AM 10/23/2021    1:12 PM 09/03/2021    1:50 PM 08/27/2021    3:48 PM  Depression screen PHQ 2/9  Decreased Interest 0 0 0 0 2 0 0  Down, Depressed, Hopeless 0 3 0 3 2 0 0  PHQ - 2 Score 0 3 0 3 4 0 0  Altered sleeping  0  0   0  Tired, decreased energy  0  3   0  Change in appetite  1  0   0  Feeling bad or failure about yourself   1  0   0  Trouble concentrating  0  3   0  Moving slowly or fidgety/restless  0  0   0  Suicidal thoughts  0  0   0  PHQ-9 Score  5  9   0  Difficult doing work/chores  Somewhat difficult     Not difficult at all     Review of Systems  Musculoskeletal:  Positive for back pain.       Groin area  All other systems reviewed and are negative.     Objective:   Physical Exam General: No acute distress HEENT: NCAT, EOMI, oral membranes moist, TRACH Cards: reg rate  Chest: normal effort Abdomen: Soft, NT, ND Skin: dry, intact Extremities: no edema Psych: pleasant and appropriate  Skin: intact Neuro:  Neurologically appears to be alert and oriented x3.  .  Strength is grossly 5 - to 5 5 out of 5 in all 4 limbs with no focal sensory findings. pt ambulates well. Minimal sob for short dx .   Musculoskeletal: Thoracic spine pain with palpation/movement.  He has ongoing dextroscoliosis with rotation of the spine as well.  seated comfortably in chair.    Assessment & Plan:  1.  Debility secondary to fall 08/17/20 with 3 column T8 vertebral body fracture/T7 neural arch fracture/right TVP T4-8/right C7 transverse process fracture through the foramen.   progressive disease on recent mri              -maintain HEP as much as he can. He's either gonna use it or lose it!             - still not exercising like he should.  2.  . Pain Management:  -Continue Xtampza 13.5 mg every 12 hours Continue Oxycodone 5mg  q6 prn, #120 to allow him 4 per day.   -We will continue the controlled substance monitoring program, this consists of regular clinic visits, examinations, routine drug screening, pill counts as well as use of Controlled Substance Reporting System. [place].                 4. Mood: Continue Wellbutrin 300 mg daily, BuSpar 10 mg 3 times daily, Klonopin 0.5 mg nightly,   -can look at weaning klonopin--I gave directions if he wants to try             -  Seroquel 150 mg twice daily and 300 mg nightly              -mood is seems improved.    5.  CAD/MI/DES.  Continue aspirin per cardiology.   6.  Acute hypercarbic ventilatory dependent respiratory failure/COPD.  Tracheostomy 09/03/2020 per Dr. Janee Morn.   -ENT/pumonary  following.                   -  7.  BPH/urine retention:              -oob to void for all attempts              -Continue hyrtin, Flomax and Urecholine. voiding well. would not change!! 8 Constipation---              Discussed his bowel regimen at length.             -Maintain softeners and laxatives.  Continue MiraLAX daily to daily as needed.              -senna and colace at bedtime.--can take combo form   15  minutes of face to face patient care time were spent during this visit. All questions were encouraged and answered. Follow up with in 2 months with nurse practitioner

## 2022-04-23 NOTE — Patient Instructions (Addendum)
ALWAYS FEEL FREE TO CALL OUR OFFICE WITH ANY PROBLEMS OR QUESTIONS [phone])  **PLEASE NOTE** ALL MEDICATION REFILL REQUESTS (INCLUDING CONTROLLED SUBSTANCES) NEED TO BE MADE AT LEAST 7 DAYS PRIOR TO REFILL BEING DUE. ANY REFILL REQUESTS INSIDE THAT TIME FRAME MAY RESULT IN DELAYS IN RECEIVING YOUR PRESCRIPTION.    YOU CAN TRY DECREASING YOUR KLONOPIN TO 0.5MG  AT BEDTIME FOR ONE WEEK. IF THINGS GO WELL YOU CAN STOP IT.                   YOU HAVE TO MOVE MORE TO MAINTAIN YOUR RESPIRATORY CAPACITY TO WALK AROUND!!   YOU CAN TAKE SENNA AND COLACE IN ONE PILL====SENNA-S , 2 TABS AT BEDTIME

## 2022-04-24 ENCOUNTER — Other Ambulatory Visit: Payer: Self-pay | Admitting: Internal Medicine

## 2022-04-24 MED ORDER — TAMSULOSIN HCL 0.4 MG PO CAPS: 0.4000 mg | ORAL_CAPSULE | Freq: Every day | ORAL | 6 refills | Status: DC

## 2022-04-29 ENCOUNTER — Telehealth: Payer: Self-pay | Admitting: Internal Medicine

## 2022-04-29 NOTE — Telephone Encounter (Signed)
Called to request chart notes from 09/13/21 onward for oxygen. Please call to discuss further.  CB# [phone]

## 2022-04-29 NOTE — Telephone Encounter (Signed)
Last two office notes printed and faxed to [employer] as requested. Noting further needed

## 2022-05-01 ENCOUNTER — Other Ambulatory Visit: Payer: Self-pay | Admitting: Physical Medicine & Rehabilitation

## 2022-05-01 DIAGNOSIS — F418 Other specified anxiety disorders: Secondary | ICD-10-CM

## 2022-05-01 NOTE — Telephone Encounter (Signed)
Patient requesting refill for Quetiapine

## 2022-05-07 ENCOUNTER — Telehealth: Payer: Self-pay

## 2022-05-07 DIAGNOSIS — G894 Chronic pain syndrome: Secondary | ICD-10-CM

## 2022-05-07 DIAGNOSIS — S22000G Wedge compression fracture of unspecified thoracic vertebra, subsequent encounter for fracture with delayed healing: Secondary | ICD-10-CM

## 2022-05-07 NOTE — Telephone Encounter (Signed)
PA submitted for [employer]

## 2022-05-07 NOTE — Telephone Encounter (Addendum)
  Xtampza need a PA. (Office fax has been down this week).

## 2022-05-07 NOTE — Telephone Encounter (Signed)
error 

## 2022-05-11 ENCOUNTER — Other Ambulatory Visit: Payer: Self-pay | Admitting: Registered Nurse

## 2022-05-16 ENCOUNTER — Encounter: Payer: Self-pay | Admitting: Physical Medicine & Rehabilitation

## 2022-05-23 ENCOUNTER — Encounter: Payer: Self-pay | Admitting: Internal Medicine

## 2022-05-23 ENCOUNTER — Other Ambulatory Visit: Payer: Self-pay

## 2022-05-29 ENCOUNTER — Other Ambulatory Visit: Payer: Self-pay | Admitting: Internal Medicine

## 2022-05-29 DIAGNOSIS — J449 Chronic obstructive pulmonary disease, unspecified: Secondary | ICD-10-CM

## 2022-06-03 ENCOUNTER — Telehealth: Payer: Self-pay | Admitting: Registered Nurse

## 2022-06-03 ENCOUNTER — Other Ambulatory Visit: Payer: Self-pay | Admitting: Physical Medicine & Rehabilitation

## 2022-06-03 DIAGNOSIS — S22000G Wedge compression fracture of unspecified thoracic vertebra, subsequent encounter for fracture with delayed healing: Secondary | ICD-10-CM

## 2022-06-03 DIAGNOSIS — G894 Chronic pain syndrome: Secondary | ICD-10-CM

## 2022-06-03 MED ORDER — XTAMPZA ER 13.5 MG PO C12A: 1.0000 | EXTENDED_RELEASE_CAPSULE | Freq: Two times a day (BID) | ORAL | 0 refills | Status: DC

## 2022-06-03 MED ORDER — OXYCODONE HCL 5 MG PO TABS: 5.0000 mg | ORAL_TABLET | Freq: Four times a day (QID) | ORAL | 0 refills | Status: DC | PRN

## 2022-06-03 NOTE — Telephone Encounter (Signed)
PMP was Reviewed.  Xtampza and Oxycodone e-scribed today.  My- Chart message sent to Mr. Sayas regarding the above.

## 2022-06-03 NOTE — Telephone Encounter (Signed)
Patient needs a refill on Xtampza.  Please call patient when this is done.

## 2022-06-05 ENCOUNTER — Telehealth: Payer: Self-pay

## 2022-06-05 NOTE — Telephone Encounter (Signed)
PA submitted for Oxycodone 

## 2022-06-16 ENCOUNTER — Encounter: Payer: Self-pay | Admitting: Internal Medicine

## 2022-06-24 ENCOUNTER — Encounter: Payer: Medicaid Other | Attending: Physical Medicine & Rehabilitation | Admitting: Registered Nurse

## 2022-06-24 ENCOUNTER — Encounter: Payer: Self-pay | Admitting: Registered Nurse

## 2022-06-24 VITALS — BP 136/84 | HR 90 | Ht 68.0 in | Wt 200.0 lb

## 2022-06-24 DIAGNOSIS — G8921 Chronic pain due to trauma: Secondary | ICD-10-CM | POA: Insufficient documentation

## 2022-06-24 DIAGNOSIS — Z5181 Encounter for therapeutic drug level monitoring: Secondary | ICD-10-CM | POA: Diagnosis present

## 2022-06-24 DIAGNOSIS — S22008S Other fracture of unspecified thoracic vertebra, sequela: Secondary | ICD-10-CM | POA: Insufficient documentation

## 2022-06-24 DIAGNOSIS — S22000G Wedge compression fracture of unspecified thoracic vertebra, subsequent encounter for fracture with delayed healing: Secondary | ICD-10-CM | POA: Insufficient documentation

## 2022-06-24 DIAGNOSIS — G894 Chronic pain syndrome: Secondary | ICD-10-CM | POA: Diagnosis present

## 2022-06-24 DIAGNOSIS — K5901 Slow transit constipation: Secondary | ICD-10-CM | POA: Diagnosis present

## 2022-06-24 DIAGNOSIS — Z79899 Other long term (current) drug therapy: Secondary | ICD-10-CM | POA: Diagnosis present

## 2022-06-24 MED ORDER — XTAMPZA ER 13.5 MG PO C12A: 1.0000 | EXTENDED_RELEASE_CAPSULE | Freq: Two times a day (BID) | ORAL | 0 refills | Status: DC

## 2022-06-24 MED ORDER — OXYCODONE HCL 5 MG PO TABS: 5.0000 mg | ORAL_TABLET | Freq: Four times a day (QID) | ORAL | 0 refills | Status: DC | PRN

## 2022-06-24 NOTE — Progress Notes (Signed)
Subjective:    Patient ID: Charles Weeks, male    DOB: 1966/04/09, 57 y.o.   MRN: [id number]  HPI: Charles Weeks is a 57 y.o. male who returns for follow up appointment for chronic pain and medication refill. He states his pain is located in his mid- lower back. He rates his pain 7. His current exercise regime is walking short distances. He was encouraged to increase his HEP as tolerated. He verbalizes understanding.   Mr. Bolds Morphine equivalent is 80.00 MME.   UDS ordered today.      Pain Inventory Average Pain 7 Pain Right Now 7 My pain is constant, sharp, burning, dull, stabbing, tingling, and aching  In the last 24 hours, has pain interfered with the following? General activity 7 Relation with others 3 Enjoyment of life 7 What TIME of day is your pain at its worst? morning , daytime, evening, and night Sleep (in general) Fair  Pain is worse with: walking, bending, sitting, standing, and some activites Pain improves with: rest, medication, and heat Relief from Meds: 7  Family History  Problem Relation Age of Onset   Heart disease Mother    Heart disease Father    Diabetes Sister    Social History   Socioeconomic History   Marital status: Single    Spouse name: Not on file   Number of children: 2   Years of education: Not on file   Highest education level: 7th grade  Occupational History   Not on file  Tobacco Use   Smoking status: Former    Packs/day: 1.50    Types: Cigarettes    Quit date: 08/17/2020    Years since quitting: 1.8   Smokeless tobacco: Never  Vaping Use   Vaping Use: Never used  Substance and Sexual Activity   Alcohol use: Not Currently   Drug use: Not Currently   Sexual activity: Not on file  Other Topics Concern   Not on file  Social History Narrative   ** Merged History Encounter **       Lives with sister  Both daughters live close by   Sister has dog   Enjoys: shooting pool, fishing, Retail banker  Diet: eats all food  groups Caffeine: pop-dr pepper 2 liter  Water: 4-6 cups   Wears seat belt  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 No weapons        Social Determinants of Health   Financial Resource Strain: Low Risk  (12/08/2019)   Overall Financial Resource Strain (CARDIA)    Difficulty of Paying Living Expenses: Not hard at all  Food Insecurity: No Food Insecurity (12/08/2019)   Hunger Vital Sign    Worried About Running Out of Food in the Last Year: Never true    Ran Out of Food in the Last Year: Never true  Transportation Needs: No Transportation Needs (12/08/2019)   PRAPARE - Hydrologist (Medical): No    Lack of Transportation (Non-Medical): No  Physical Activity: Inactive (12/08/2019)   Exercise Vital Sign    Days of Exercise per Week: 0 days    Minutes of Exercise per Session: 0 min  Stress: No Stress Concern Present (12/08/2019)   [place]    Feeling of Stress : Not at all  Social Connections: Socially Isolated (12/08/2019)   Social Connection and Isolation Panel [NHANES]    Frequency of Communication with Friends and Family:  More than three times a week    Frequency of Social Gatherings with Friends and Family: More than three times a week    Attends Religious Services: Never    Marine scientist or Organizations: No    Attends Archivist Meetings: Never    Marital Status: Divorced   Past Surgical History:  Procedure Laterality Date   BALLOON DILATION N/A 02/06/2021   Procedure: BALLOON DILATION;  Surgeon: Melida Quitter, MD;  Location: [place];  Service: ENT;  Laterality: N/A;   BOWEL RESECTION  2006   MICROLARYNGOSCOPY WITH LASER AND BALLOON DILATION N/A 02/06/2021   Procedure: SUSPENDED MICRO DIRECT LARYNGOSCOPY;  Surgeon: Melida Quitter, MD;  Location: [place];  Service: ENT;  Laterality: N/A;   PEG PLACEMENT N/A 09/03/2020   Procedure: PERCUTANEOUS  ENDOSCOPIC GASTROSTOMY (PEG) PLACEMENT;  Surgeon: Georganna Skeans, MD;  Location: [place];  Service: General;  Laterality: N/A;   PERCUTANEOUS TRACHEOSTOMY N/A 09/03/2020   Procedure: PERCUTANEOUS TRACHEOSTOMY USING 6 SHILEY;  Surgeon: Georganna Skeans, MD;  Location: [place];  Service: General;  Laterality: N/A;   SKIN GRAFT     TONSILLECTOMY AND ADENOIDECTOMY     Past Surgical History:  Procedure Laterality Date   BALLOON DILATION N/A 02/06/2021   Procedure: BALLOON DILATION;  Surgeon: Melida Quitter, MD;  Location: [place];  Service: ENT;  Laterality: N/A;   BOWEL RESECTION  2006   MICROLARYNGOSCOPY WITH LASER AND BALLOON DILATION N/A 02/06/2021   Procedure: SUSPENDED MICRO DIRECT LARYNGOSCOPY;  Surgeon: Melida Quitter, MD;  Location: [place];  Service: ENT;  Laterality: N/A;   PEG PLACEMENT N/A 09/03/2020   Procedure: PERCUTANEOUS ENDOSCOPIC GASTROSTOMY (PEG) PLACEMENT;  Surgeon: Georganna Skeans, MD;  Location: [place];  Service: General;  Laterality: N/A;   PERCUTANEOUS TRACHEOSTOMY N/A 09/03/2020   Procedure: PERCUTANEOUS TRACHEOSTOMY USING 6 SHILEY;  Surgeon: Georganna Skeans, MD;  Location: [place];  Service: General;  Laterality: N/A;   SKIN GRAFT     TONSILLECTOMY AND ADENOIDECTOMY     Past Medical History:  Diagnosis Date   Anxiety 01/19/2020   Arthritis    Asthma    Benign prostatic hyperplasia with weak urinary stream 12/08/2019   BPH (benign prostatic hyperplasia) 08/17/2020   Chronic respiratory failure with hypoxia ([place]) 01/24/2020   Has home 02 but not using as of 01/24/2020  -  01/24/2020   Walked RA  approx   200 ft  @ slow pace  stopped due to  Dizzy with sats still 95%      Cigarette smoker 01/24/2020   Stopped regular smoking 10/2019 but still "now and then" as of 01/24/2020    COPD (chronic obstructive pulmonary disease) ([place]) 08/17/2020   gold   COPD GOLD ?     Quit smoking 10/2019  - Labs ordered 01/24/2020  :  allergy profile   alpha one AT phenotype   - 01/24/2020  After extensive  coaching inhaler device,  effectiveness =    75% (short Ti) > change symb 80 to 160 2bid      Coronary artery disease 06/08/2018   DES to RCA, [facility] [place], [place])   Depression, major, single episode, moderate ([place]) 01/19/2020   Dyspnea    Encounter for screening for malignant neoplasm of colon 12/08/2019   Essential hypertension 12/08/2019   History of MI (myocardial infarction) 01/19/2020   HTN (hypertension) 08/17/2020   Myocardial infarct ([place]) 2019   Pneumonia    Positive colorectal cancer screening using Cologuard test  There were no vitals taken for this visit.  Opioid Risk Score:   Fall Risk Score:  `1  Depression screen [facility] 2/9     04/16/2022   10:55 AM 01/07/2022   10:13 AM 12/25/2021   12:52 PM 12/04/2021    9:56 AM 10/23/2021    1:12 PM 09/03/2021    1:50 PM 08/27/2021    3:48 PM  Depression screen PHQ 2/9  Decreased Interest 0 0 0 0 2 0 0  Down, Depressed, Hopeless 0 3 0 3 2 0 0  PHQ - 2 Score 0 3 0 3 4 0 0  Altered sleeping  0  0   0  Tired, decreased energy  0  3   0  Change in appetite  1  0   0  Feeling bad or failure about yourself   1  0   0  Trouble concentrating  0  3   0  Moving slowly or fidgety/restless  0  0   0  Suicidal thoughts  0  0   0  PHQ-9 Score  5  9   0  Difficult doing work/chores  Somewhat difficult     Not difficult at all    Review of Systems  Musculoskeletal:  Positive for gait problem.  All other systems reviewed and are negative.      Objective:   Physical Exam Vitals and nursing note reviewed.  Constitutional:      Appearance: Normal appearance.  Cardiovascular:     Rate and Rhythm: Normal rate and regular rhythm.     Pulses: Normal pulses.     Heart sounds: Normal heart sounds.  Pulmonary:     Breath sounds: Wheezing and rhonchi present.     Comments: Trach Collar with Continuous Oxygen at 2 Liters nasal cannula  Musculoskeletal:     Cervical back: Normal range of motion and neck supple.      Comments: Normal Muscle Bulk and Muscle Testing Reveals:  Upper Extremities: Full ROM and Muscle Strength 5/5  Thoracic Paraspinal Tenderness: T-7-T-9 Lumbar Paraspinal Tenderness: L-3-L-5 Lower Extremities: Full ROM and Muscle Strength 5/5 Arises from Table with ease Narrow Based  Gait     Skin:    General: Skin is warm and dry.  Neurological:     Mental Status: He is alert and oriented to person, place, and time.  Psychiatric:        Mood and Affect: Mood normal.        Behavior: Behavior normal.         Assessment & Plan:  1.Thoracic Spine Fracture:  Continue HEP as Tolerated. Continue to Monitor. 06/24/2022 2.Subglottic Stenosis/Tracheostomy: Deno Lunger with 2 liters of Oxygen: Dr. Fredric Dine Following. Continue to Monitor. 06/24/2022 3. Urinary Retention due to BPH: Continue current medication regimen. PCP following. 06/24/2022 4. Chronic  Pain:Syndrome Refilled: Xtampza 13.5 mg every 12 hours# 60 and  and Oxycodone 5 mg 4 times a day as needed for pain #120. We will continue the opioid monitoring program, this consists of regular clinic visits, examinations, urine drug screen, pill counts as well as use of [place] Controlled Substance Reporting system. A 12 month History has been reviewed on the [place] Controlled Substance Reporting System on 06/24/2022 5. Slow Transit Constipation: Continue with Bowel Regimen. Continue to Monitor.    F/U  with Dr Naaman Plummer

## 2022-06-25 ENCOUNTER — Other Ambulatory Visit: Payer: Self-pay | Admitting: Internal Medicine

## 2022-06-25 ENCOUNTER — Other Ambulatory Visit: Payer: Self-pay

## 2022-06-25 ENCOUNTER — Encounter: Payer: Self-pay | Admitting: Internal Medicine

## 2022-06-25 DIAGNOSIS — J449 Chronic obstructive pulmonary disease, unspecified: Secondary | ICD-10-CM

## 2022-06-25 DIAGNOSIS — K219 Gastro-esophageal reflux disease without esophagitis: Secondary | ICD-10-CM

## 2022-06-25 MED ORDER — PANTOPRAZOLE SODIUM 40 MG PO TBEC: 40.0000 mg | DELAYED_RELEASE_TABLET | Freq: Every day | ORAL | 1 refills | Status: DC

## 2022-06-27 LAB — TOXASSURE SELECT,+ANTIDEPR,UR

## 2022-07-10 ENCOUNTER — Encounter (INDEPENDENT_AMBULATORY_CARE_PROVIDER_SITE_OTHER): Payer: Self-pay | Admitting: Gastroenterology

## 2022-07-10 ENCOUNTER — Ambulatory Visit (INDEPENDENT_AMBULATORY_CARE_PROVIDER_SITE_OTHER): Payer: Medicaid Other | Admitting: Gastroenterology

## 2022-07-10 VITALS — BP 115/80 | HR 90 | Temp 97.8°F | Ht 68.0 in | Wt 197.8 lb

## 2022-07-10 DIAGNOSIS — R110 Nausea: Secondary | ICD-10-CM | POA: Diagnosis not present

## 2022-07-10 DIAGNOSIS — K529 Noninfective gastroenteritis and colitis, unspecified: Secondary | ICD-10-CM

## 2022-07-10 DIAGNOSIS — K5903 Drug induced constipation: Secondary | ICD-10-CM | POA: Diagnosis not present

## 2022-07-10 MED ORDER — ONDANSETRON HCL 4 MG PO TABS: 4.0000 mg | ORAL_TABLET | Freq: Three times a day (TID) | ORAL | 1 refills | Status: DC | PRN

## 2022-07-10 NOTE — Patient Instructions (Addendum)
Continue Miralax daily, titrate as needed to avoid straining or constipation Can take Zofran as needed for nausea episodes. Avoid drinking sodas if possible as this may worsen abdominal distention

## 2022-07-10 NOTE — Progress Notes (Signed)
Charles Weeks, M.D. Gastroenterology & Hepatology [facility] [address] [place], [place] [address]  Primary Care Physician: Lindell Spar, MD [address] [place] [address]  I will communicate my assessment and recommendations to the referring MD via EMR.  Problems: Ileus, likely exacerbated due to chronic opiate use   History of Present Illness: Charles Weeks is a 57 year old male with past medical history of asthma, COPD, BPH, depression, hypertension, coronary artery disease, myocardial infarction, diverticulitis s/p partial colectomy, chronic neck and back pain due to a fall and status post tracheostomy on home oxygen 3 L at home, who presents for follow up of chronic constipation and transient ileus due to opiate use.  The patient was last seen on 01/02/2022. At that time, the patient was advised to continue a bowel regimen of Senokot, docusate and MiraLAX.  He had a positive Cologuard testing on 01/21/2022.  Due to this, he was referred to [facility] for further evaluation at tertiary center given his multiple comorbidities.  This procedure was performed on 03/20/2022 by Dr.Jawitz with the following findings: - The examined portion of the ileum was normal. - Four 4 to 5 mm polyps in the descending colon, in  the transverse colon and in the ascending colon,  removed with a cold snare. Resected and retrieved. - Erythematous mucosa in the rectum and at the splenic  flexure, and area of relative luminal stenosis at  approximately 30cm proximal to the anus. Overall query  ischemic vs stercoral injury. Biopsied. - Patent end-to-end colo-colonic anastomosis at 20cm  from the anus, characterized by healthy appearing  mucosa. - Internal hemorrhoids.   Path:  A. Colon polyps, endoscopic polypectomy:  Tubular adenoma, multiple fragments. Benign inflammatory polyp, one fragment.  B. Splenic flexure, endoscopic biopsy:  Colonic  mucosa with increased lamina propria chronic inflammatory infiltrates (including eosinophils), surface epithelial degenerative change and focal mild crypt distortion. See comment.  C. Colon, sigmoid, endoscopic biopsy:  Colonic mucosa with mild crypt distortion and increased lamina propria eosinophils. See comment.  B. Rectum, endoscopic biopsy:  Colonic mucosa with increased lamina propria inflammation, cryptitis and surface epithelial degenerative changes. See comment.  Comment: The differential diagnosis includes drug-induced injury, stercoral injury and less likely infectious etiologies. In addition, ischemic injury cannot be ruled out due to atrophic appearance of surface epithelium, however other typical ischemic histologic changes are not seen in these biopsies. Basal plasmacytosis is not seen. Inflammatory bowel disease is not favored.  Given severe comorbidities patient, he was recommended to not have any more colonoscopies in the future unless having clinical symptoms.  Patient comes to the office with his sister who helps providing some information. He has a home health aide as well that helps him. He reports feeling well and denies any frequent complaints. He has had 2 episodes in which he would have to strain significantly to move his bowels, which led to drop in his HR to th 40s and low O2 saturation, but did not require any other treatment.  He denies any abdominal pain, but he has significant belching and burping as he drinks multiple sodas a day (Dr. Malachi Bonds, Coke and Pepsi). No abdominal distention.  He is taking Miralax 1.5 capfuls per day, but sometimes two times a day.  Also takes standing Senokot and docusate.  The patient and sister state that they are working on getting him to a "sweet point" regarding his bowel movements. He has a BM almost every day.  Very occasionally  has some nausea. He feels Zofran helps with the symptoms.  Currently taking oxycodone 5 mg every 6  hours.  The patient denies having any vomiting, fever, chills, hematochezia, melena, hematemesis, abdominal distention, abdominal pain, diarrhea, jaundice, pruritus or weight loss.  Last Colonoscopy: as above  Past Medical History: Past Medical History:  Diagnosis Date   Anxiety 01/19/2020   Arthritis    Asthma    Benign prostatic hyperplasia with weak urinary stream 12/08/2019   BPH (benign prostatic hyperplasia) 08/17/2020   Chronic respiratory failure with hypoxia ([place]) 01/24/2020   Has home 02 but not using as of 01/24/2020  -  01/24/2020   Walked RA  approx   200 ft  @ slow pace  stopped due to  Dizzy with sats still 95%      Cigarette smoker 01/24/2020   Stopped regular smoking 10/2019 but still "now and then" as of 01/24/2020    COPD (chronic obstructive pulmonary disease) ([place]) 08/17/2020   gold   COPD GOLD ?     Quit smoking 10/2019  - Labs ordered 01/24/2020  :  allergy profile   alpha one AT phenotype   - 01/24/2020  After extensive coaching inhaler device,  effectiveness =    75% (short Ti) > change symb 80 to 160 2bid      Coronary artery disease 06/08/2018   DES to RCA, [facility] [place], [place])   Depression, major, single episode, moderate ([place]) 01/19/2020   Dyspnea    Encounter for screening for malignant neoplasm of colon 12/08/2019   Essential hypertension 12/08/2019   History of MI (myocardial infarction) 01/19/2020   HTN (hypertension) 08/17/2020   Myocardial infarct ([place]) 2019   Pneumonia    Positive colorectal cancer screening using Cologuard test     Past Surgical History: Past Surgical History:  Procedure Laterality Date   BALLOON DILATION N/A 02/06/2021   Procedure: BALLOON DILATION;  Surgeon: Melida Quitter, MD;  Location: [facility];  Service: ENT;  Laterality: N/A;   BOWEL RESECTION  2006   MICROLARYNGOSCOPY WITH LASER AND BALLOON DILATION N/A 02/06/2021   Procedure: SUSPENDED MICRO DIRECT LARYNGOSCOPY;  Surgeon: Melida Quitter, MD;  Location: [facility];   Service: ENT;  Laterality: N/A;   PEG PLACEMENT N/A 09/03/2020   Procedure: PERCUTANEOUS ENDOSCOPIC GASTROSTOMY (PEG) PLACEMENT;  Surgeon: Georganna Skeans, MD;  Location: [facility];  Service: General;  Laterality: N/A;   PERCUTANEOUS TRACHEOSTOMY N/A 09/03/2020   Procedure: PERCUTANEOUS TRACHEOSTOMY USING 6 SHILEY;  Surgeon: Georganna Skeans, MD;  Location: [facility];  Service: General;  Laterality: N/A;   SKIN GRAFT     TONSILLECTOMY AND ADENOIDECTOMY      Family History: Family History  Problem Relation Age of Onset   Heart disease Mother    Heart disease Father    Diabetes Sister     Social History: Social History   Tobacco Use  Smoking Status Former   Packs/day: 1.50   Types: Cigarettes   Quit date: 08/17/2020   Years since quitting: 1.8  Smokeless Tobacco Never   Social History   Substance and Sexual Activity  Alcohol Use Not Currently   Social History   Substance and Sexual Activity  Drug Use Not Currently    Allergies: Allergies  Allergen Reactions   Ciprofloxacin Hives    Medications: Current Outpatient Medications  Medication Sig Dispense Refill   acetaminophen (TYLENOL) 325 MG tablet Take 2 tablets (650 mg total) by mouth every 6 (six) hours as needed for mild pain or  moderate pain.     aspirin 81 MG chewable tablet Chew 1 tablet (81 mg total) by mouth daily.     bethanechol (URECHOLINE) 25 MG tablet Take 2 tablets (50 mg total) by mouth 4 (four) times daily. 240 tablet 5   budesonide (PULMICORT) 0.25 MG/2ML nebulizer solution 2.5 mg  twice daily with performist 120 mL 12   buPROPion (WELLBUTRIN XL) 300 MG 24 hr tablet Take 1 tablet (300 mg total) by mouth daily. 30 tablet 5   busPIRone (BUSPAR) 10 MG tablet Take 1 tablet (10 mg total) by mouth 3 (three) times daily. (Patient taking differently: Take 10 mg by mouth 2 (two) times daily.) 90 tablet 5   Calcium Carb-Cholecalciferol (CALCIUM 600/VITAMIN D) 600-400 MG-UNIT CHEW Chew 1 each by mouth daily.      Cholecalciferol (VITAMIN D3) 125 MCG (5000 UT) CAPS Take 5,000 Units by mouth daily.     clonazePAM (KLONOPIN) 0.5 MG tablet TAKE 1 TABLET BY MOUTH AT BEDTIME. 30 tablet 3   Docusate Sodium (STOOL SOFTENER LAXATIVE PO) Take 200 mg by mouth daily. 2-4 tablets     folic acid (FOLVITE) 1 MG tablet TAKE ONE TABLET BY MOUTH ONCE DAILY. 90 tablet 3   formoterol (PERFOROMIST) 20 MCG/2ML nebulizer solution Take 2 mLs (20 mcg total) by nebulization 2 (two) times daily. Use in nebulizer twice daily perfectly regularly 120 mL 11   Misc. Devices ([facility]) MISC For transporting to and from Doctors appointments 1 each 0   montelukast (SINGULAIR) 10 MG tablet Take 1 tablet (10 mg total) by mouth at bedtime. 90 tablet 3   nitroGLYCERIN (NITROSTAT) 0.4 MG SL tablet Place 1 tablet (0.4 mg total) under the tongue every 5 (five) minutes as needed for chest pain. 25 tablet 2   ondansetron (ZOFRAN) 4 MG tablet Take 1 tablet (4 mg total) by mouth every 8 (eight) hours as needed for nausea or vomiting. 20 tablet 0   oxyCODONE (OXY IR/ROXICODONE) 5 MG immediate release tablet Take 1 tablet (5 mg total) by mouth 4 (four) times daily as needed for moderate pain. 120 tablet 0   oxyCODONE ER (XTAMPZA ER) 13.5 MG C12A Take 1 capsule by mouth every 12 (twelve) hours. 60 capsule 0   OXYGEN Inhale 3 L into the lungs continuous.     pantoprazole (PROTONIX) 40 MG tablet Take 1 tablet (40 mg total) by mouth daily. 90 tablet 1   polyethylene glycol powder (GLYCOLAX/MIRALAX) 17 GM/SCOOP powder Take 255 g by mouth daily. 850 g 5   QUEtiapine (SEROQUEL) 300 MG tablet TAKE (1/2) A TABLET BY MOUTH TWICE A DAY, AND (1) TABLET AT BEDTIME. 60 tablet 5   rosuvastatin (CRESTOR) 5 MG tablet TAKE ONE TABLET BY MOUTH ONCE DAILY 90 tablet 3   tamsulosin (FLOMAX) 0.4 MG CAPS capsule Take 1 capsule (0.4 mg total) by mouth daily. 60 capsule 6   terazosin (HYTRIN) 5 MG capsule Take 5 mg by mouth at bedtime.     VENTOLIN HFA 108 (90 Base)  MCG/ACT inhaler INHALE 1 OR 2 PUFFS INTO THE LUNGS EVERY 4 HOURS AS NEEDED FOR WHEEZING OR SHORTNESS OF BREATH 18 g 0   No current facility-administered medications for this visit.    Review of Systems: GENERAL: negative for malaise, night sweats HEENT: No changes in hearing or vision, no nose bleeds or other nasal problems. NECK: Negative for lumps, goiter, pain and significant neck swelling RESPIRATORY: Negative for cough, wheezing CARDIOVASCULAR: Negative for chest pain, leg swelling, palpitations, orthopnea  GI: SEE HPI MUSCULOSKELETAL: Negative for joint pain or swelling, back pain, and muscle pain. SKIN: Negative for lesions, rash PSYCH: Negative for sleep disturbance, mood disorder and recent psychosocial stressors. HEMATOLOGY Negative for prolonged bleeding, bruising easily, and swollen nodes. ENDOCRINE: Negative for cold or heat intolerance, polyuria, polydipsia and goiter. NEURO: negative for tremor, gait imbalance, syncope and seizures. The remainder of the review of systems is noncontributory.   Physical Exam: BP 115/80 (BP Location: Left Arm, Patient Position: Sitting, Cuff Size: Large)   Pulse 90   Temp 97.8 F (36.6 C) (Temporal)   Ht 5' 8"$  (1.727 m)   Wt 197 lb 12.8 oz (89.7 kg)   BMI 30.08 kg/m  GENERAL: The patient is AO x3, in no acute distress. HEENT: Head is normocephalic and atraumatic. EOMI are intact. Mouth is well hydrated and without lesions. NECK: Has a trach collar and uses oxygen mask. LUNGS: Clear to auscultation. No presence of rhonchi/wheezing/rales. Adequate chest expansion HEART: RRR, normal s1 and s2. ABDOMEN: Soft, nontender, no guarding, no peritoneal signs, moderately distended but not tense. BS +. No masses. EXTREMITIES: Without any cyanosis, clubbing, rash, lesions or edema. NEUROLOGIC: AOx3, no focal motor deficit. SKIN: no jaundice, no rashes  Imaging/Labs: as above  I personally reviewed and interpreted the available labs, imaging  and endoscopic files.  Impression and Plan: Ashwath Chau is a 57 year old male with past medical history of asthma, COPD, BPH, depression, hypertension, coronary artery disease, myocardial infarction, diverticulitis s/p partial colectomy, chronic neck and back pain due to a fall and status post tracheostomy on home oxygen 3 L at home, who presents for follow up of chronic constipation and transient ileus due to opiate use.  Patient has presented significant improvement of his abdominal distention and constipation episodes with the current regimen of Senokot, docusate and MiraLAX titration.  He denies having any ongoing complaints although there is some intermittent bloating likely related to the sodas that he drinks.  We had a thorough discussion regarding importance of keeping his bowels regular.  Even though he may have some contact dermatitis when his stools are too loose, this will be preferable then having constipation as it may have led to vasovagal events in the past.  He can use Desitin if he has perianal dermatitis.  Has treated some occasional nausea which has responded to Zofran, this will be refilled today.  - Continue Miralax daily, titrate as needed to avoid straining or constipation - Can take Zofran as needed for nausea episodes. - Avoid drinking sodas if possible as this may worsen abdominal distention  All questions were answered.      Charles Peppers, MD Gastroenterology and Hepatology [facility]

## 2022-08-05 ENCOUNTER — Telehealth: Payer: Self-pay | Admitting: *Deleted

## 2022-08-05 NOTE — Telephone Encounter (Signed)
PA for Xtampza submitted via fax  MCD.  Patient's sister Gaynell Face notified as she had left vm.

## 2022-08-07 ENCOUNTER — Telehealth: Payer: Self-pay | Admitting: *Deleted

## 2022-08-07 NOTE — Telephone Encounter (Signed)
LM PA resubmitted for correct quantity #60. We will notify tomorrow.

## 2022-08-07 NOTE — Telephone Encounter (Signed)
Informed patient;s sister PA approved for Xtampza effective 08/06/22-11/04/22. PA # [id number]

## 2022-08-08 NOTE — Telephone Encounter (Signed)
Called and verified with [facility] Ginger Organ has been approved and filled this morning.

## 2022-08-12 ENCOUNTER — Other Ambulatory Visit: Payer: Self-pay | Admitting: Internal Medicine

## 2022-08-18 ENCOUNTER — Ambulatory Visit: Payer: Medicaid Other | Admitting: Internal Medicine

## 2022-08-22 ENCOUNTER — Other Ambulatory Visit: Payer: Self-pay

## 2022-08-22 ENCOUNTER — Encounter: Payer: Self-pay | Admitting: Internal Medicine

## 2022-08-22 DIAGNOSIS — J9611 Chronic respiratory failure with hypoxia: Secondary | ICD-10-CM

## 2022-08-22 DIAGNOSIS — J449 Chronic obstructive pulmonary disease, unspecified: Secondary | ICD-10-CM

## 2022-08-25 ENCOUNTER — Other Ambulatory Visit: Payer: Self-pay

## 2022-08-25 ENCOUNTER — Other Ambulatory Visit: Payer: Self-pay | Admitting: Internal Medicine

## 2022-08-25 ENCOUNTER — Encounter: Payer: Self-pay | Admitting: Internal Medicine

## 2022-08-25 DIAGNOSIS — J449 Chronic obstructive pulmonary disease, unspecified: Secondary | ICD-10-CM

## 2022-08-25 MED ORDER — ALBUTEROL SULFATE HFA 108 (90 BASE) MCG/ACT IN AERS
INHALATION_SPRAY | RESPIRATORY_TRACT | 0 refills | Status: DC
Start: 2022-08-25 — End: 2022-10-23

## 2022-08-27 ENCOUNTER — Encounter: Payer: Medicaid Other | Attending: Physical Medicine & Rehabilitation | Admitting: Physical Medicine & Rehabilitation

## 2022-08-27 ENCOUNTER — Encounter: Payer: Self-pay | Admitting: Physical Medicine & Rehabilitation

## 2022-08-27 VITALS — BP 132/79 | HR 92 | Ht 68.0 in | Wt 196.0 lb

## 2022-08-27 DIAGNOSIS — S22008S Other fracture of unspecified thoracic vertebra, sequela: Secondary | ICD-10-CM | POA: Diagnosis not present

## 2022-08-27 DIAGNOSIS — S22000G Wedge compression fracture of unspecified thoracic vertebra, subsequent encounter for fracture with delayed healing: Secondary | ICD-10-CM | POA: Insufficient documentation

## 2022-08-27 DIAGNOSIS — G894 Chronic pain syndrome: Secondary | ICD-10-CM | POA: Diagnosis present

## 2022-08-27 DIAGNOSIS — J398 Other specified diseases of upper respiratory tract: Secondary | ICD-10-CM | POA: Diagnosis not present

## 2022-08-27 DIAGNOSIS — G8921 Chronic pain due to trauma: Secondary | ICD-10-CM | POA: Insufficient documentation

## 2022-08-27 MED ORDER — OXYCODONE HCL 5 MG PO TABS
5.0000 mg | ORAL_TABLET | Freq: Four times a day (QID) | ORAL | 0 refills | Status: DC | PRN
Start: 2022-08-27 — End: 2022-08-27

## 2022-08-27 MED ORDER — OXYCODONE HCL 5 MG PO TABS
5.0000 mg | ORAL_TABLET | Freq: Four times a day (QID) | ORAL | 0 refills | Status: DC | PRN
Start: 2022-08-27 — End: 2022-10-27

## 2022-08-27 MED ORDER — XTAMPZA ER 13.5 MG PO C12A
1.0000 | EXTENDED_RELEASE_CAPSULE | Freq: Two times a day (BID) | ORAL | 0 refills | Status: DC
Start: 2022-08-27 — End: 2022-10-27

## 2022-08-27 MED ORDER — XTAMPZA ER 13.5 MG PO C12A
1.0000 | EXTENDED_RELEASE_CAPSULE | Freq: Two times a day (BID) | ORAL | 0 refills | Status: DC
Start: 2022-08-27 — End: 2022-08-27

## 2022-08-27 NOTE — Progress Notes (Signed)
Subjective:    Patient ID: Charles Weeks, male    DOB: Sep 29, 1965, 57 y.o.   MRN: [id number]  HPI  Charles Weeks is here in follow up of his chronic pain. He has been doing well for the most part. He has had swelling in his left hand with associated pain. He doesn't recall any precipitating factors or injuries. He is left handed. He hasn't really done anything different to treat pain there   His back pain is steady. Medications seem to control pain.   He has  had no changes from a pulmonary standpoint. He remains with chronic trach. Phonation is improving.   Bladder is emptying and bowels are regular with miralax and senna-s daily.   He's eatin well and in fact trying to lose some weight.   Pain Inventory Average Pain 8 Pain Right Now 8 My pain is dull and aching  In the last 24 hours, has pain interfered with the following? General activity 8 Relation with others 8 Enjoyment of life 8 What TIME of day is your pain at its worst? morning , evening, and night Sleep (in general) Good  Pain is worse with: walking, bending, sitting, and standing Pain improves with: rest and medication Relief from Meds: 7  Family History  Problem Relation Age of Onset   Heart disease Mother    Heart disease Father    Diabetes Sister    Social History   Socioeconomic History   Marital status: Single    Spouse name: Not on file   Number of children: 2   Years of education: Not on file   Highest education level: 7th grade  Occupational History   Not on file  Tobacco Use   Smoking status: Former    Packs/day: 1.5    Types: Cigarettes    Quit date: 08/17/2020    Years since quitting: 2.0   Smokeless tobacco: Never  Vaping Use   Vaping Use: Never used  Substance and Sexual Activity   Alcohol use: Not Currently   Drug use: Not Currently   Sexual activity: Not on file  Other Topics Concern   Not on file  Social History Narrative   ** Merged History Encounter **       Lives with sister   Both daughters live close by   Sister has dog   Enjoys: shooting pool, fishing, Therapist, nutritional  Diet: eats all food groups Caffeine: pop-dr pepper 2 liter  Water: 4-6 cups   Wears seat belt  No driving for him Psychologist, sport and exercise  Fi   re Extinguisher No weapons        Social Determinants of Health   Financial Resource Strain: Low Risk  (12/08/2019)   Overall Financial Resource Strain (CARDIA)    Difficulty of Paying Living Expenses: Not hard at all  Food Insecurity: No Food Insecurity (12/08/2019)   Hunger Vital Sign    Worried About Running Out of Food in the Last Year: Never true    Ran Out of Food in the Last Year: Never true  Transportation Needs: No Transportation Needs (12/08/2019)   PRAPARE - Administrator, Civil Service (Medical): No    Lack of Transportation (Non-Medical): No  Physical Activity: Inactive (12/08/2019)   Exercise Vital Sign    Days of Exercise per Week: 0 days    Minutes of Exercise per Session: 0 min  Stress: No Stress Concern Present (12/08/2019)   [employer] of Occupational Health - Occupational Stress Questionnaire  Feeling of Stress : Not at all  Social Connections: Socially Isolated (12/08/2019)   Social Connection and Isolation Panel [NHANES]    Frequency of Communication with Friends and Family: More than three times a week    Frequency of Social Gatherings with Friends and Family: More than three times a week    Attends Religious Services: Never    Database administratorActive Member of Clubs or Organizations: No    Attends BankerClub or Organization Meetings: Never    Marital Status: Divorced   Past Surgical History:  Procedure Laterality Date   BALLOON DILATION N/A 02/06/2021   Procedure: BALLOON DILATION;  Surgeon: Christia ReadingBates, Dwight, MD;  [facility];  Service: ENT;  Laterality: N/A;   BOWEL RESECTION  2006   MICROLARYNGOSCOPY WITH LASER AND BALLOON DILATION N/A 02/06/2021   Procedure: SUSPENDED MICRO DIRECT LARYNGOSCOPY;  Surgeon: Christia ReadingBates, Dwight, MD;   [facility];  Service: ENT;  Laterality: N/A;   PEG PLACEMENT N/A 09/03/2020   Procedure: PERCUTANEOUS ENDOSCOPIC GASTROSTOMY (PEG) PLACEMENT;  Surgeon: Violeta Gelinashompson, Burke, MD;  [facility];  Service: General;  Laterality: N/A;   PERCUTANEOUS TRACHEOSTOMY N/A 09/03/2020   Procedure: PERCUTANEOUS TRACHEOSTOMY USING 6 SHILEY;  Surgeon: Violeta Gelinashompson, Burke, MD;  [facility];  Service: General;  Laterality: N/A;   SKIN GRAFT     TONSILLECTOMY AND ADENOIDECTOMY     Past Surgical History:  Procedure Laterality Date   BALLOON DILATION N/A 02/06/2021   Procedure: BALLOON DILATION;  Surgeon: Christia ReadingBates, Dwight, MD;  [facility];  Service: ENT;  Laterality: N/A;   BOWEL RESECTION  2006   MICROLARYNGOSCOPY WITH LASER AND BALLOON DILATION N/A 02/06/2021   Procedure: SUSPENDED MICRO DIRECT LARYNGOSCOPY;  Surgeon: Christia ReadingBates, Dwight, MD;  [facility];  Service: ENT;  Laterality: N/A;   PEG PLACEMENT N/A 09/03/2020   Procedure: PERCUTANEOUS ENDOSCOPIC GASTROSTOMY (PEG) PLACEMENT;  Surgeon: Violeta Gelinashompson, Burke, MD;  [facility];  Service: General;  Laterality: N/A;   PERCUTANEOUS TRACHEOSTOMY N/A 09/03/2020   Procedure: PERCUTANEOUS TRACHEOSTOMY USING 6 SHILEY;  Surgeon: Violeta Gelinashompson, Burke, MD;  [facility];  Service: General;  Laterality: N/A;   SKIN GRAFT     TONSILLECTOMY AND ADENOIDECTOMY     Past Medical History:  Diagnosis Date   Anxiety 01/19/2020   Arthritis    Asthma    Benign prostatic hyperplasia with weak urinary stream 12/08/2019   BPH (benign prostatic hyperplasia) 08/17/2020   Chronic respiratory failure with hypoxia 01/24/2020   Has home 02 but not using as of 01/24/2020  -  01/24/2020   Walked RA  approx   200 ft  @ slow pace  stopped due to  Dizzy with sats still 95%      Cigarette smoker 01/24/2020   Stopped regular smoking 10/2019 but still "now and then" as of 01/24/2020    COPD (chronic obstructive pulmonary disease) 08/17/2020   gold   COPD GOLD ?     Quit smoking 10/2019  - Labs  ordered 01/24/2020  :  allergy profile   alpha one AT phenotype   - 01/24/2020  After extensive coaching inhaler device,  effectiveness =    75% (short Ti) > change symb 80 to 160 2bid      Coronary artery disease 06/08/2018   [facility]   Depression, major, single episode, moderate 01/19/2020   Dyspnea    Encounter for screening for malignant neoplasm of colon 12/08/2019   Essential hypertension 12/08/2019   History of MI (  myocardial infarction) 01/19/2020   HTN (hypertension) 08/17/2020   Myocardial infarct 2019   Pneumonia    Positive colorectal cancer screening using Cologuard test    BP 132/79   Pulse 92   Ht 5\' 8"  (1.727 m)   Wt 196 lb (88.9 kg)   SpO2 94% Comment: 2 L [place]  BMI 29.80 kg/m   Opioid Risk Score:   Fall Risk Score:  `1  Depression screen [facility] 2/9     08/27/2022   11:14 AM 06/24/2022    1:02 PM 04/16/2022   10:55 AM 01/07/2022   10:13 AM 12/25/2021   12:52 PM 12/04/2021    9:56 AM 10/23/2021    1:12 PM  Depression screen PHQ 2/9  Decreased Interest 1 1 0 0 0 0 2  Down, Depressed, Hopeless 1 1 0 3 0 3 2  PHQ - 2 Score 2 2 0 3 0 3 4  Altered sleeping    0  0   Tired, decreased energy    0  3   Change in appetite    1  0   Feeling bad or failure about yourself     1  0   Trouble concentrating    0  3   Moving slowly or fidgety/restless    0  0   Suicidal thoughts    0  0   PHQ-9 Score    5  9   Difficult doing work/chores    Somewhat difficult        Review of Systems  Constitutional: Negative.   HENT: Negative.    Eyes: Negative.   Respiratory:  Positive for shortness of breath.        On O2  Cardiovascular: Negative.   Gastrointestinal: Negative.   Endocrine: Negative.   Genitourinary: Negative.   Musculoskeletal:  Positive for back pain.  Skin: Negative.   Allergic/Immunologic: Negative.   Neurological:  Positive for headaches.  Hematological: Negative.   Psychiatric/Behavioral:  Positive for dysphoric mood.    All other systems reviewed and are negative.      Objective:   Physical Exam  General: No acute distress HEENT: NCAT, EOMI, oral membranes moist. Phonating better.  Cards: reg rate  Chest: normal effort Abdomen: Soft, NT, ND Skin: dry, intact Extremities: no edema Psych: pleasant and appropriate  Skin: intact Neuro: Neurologically appears to be alert and oriented x3.  .  Strength is grossly 5 - to 5 5 out of 5 in all 4 limbs with no focal sensory findings. pt ambulates well. Minimal sob for short dx .   Musculoskeletal: Thoracic spine pain with palpation/movement.  Persistent dextroscoliosis with rotation of the spine as well.  seated comfortably in chair. Left wrist sl swollen, tender at carpals as well as carpal-metacarpals. Watch is tight on wrist.    Assessment & Plan:  1.  Debility secondary to fall 08/17/20 with 3 column T8 vertebral body fracture/T7 neural arch fracture/right TVP T4-8/right C7 transverse process fracture through the foramen.   progressive disease on recent mri              -maintain HEP             - encouraged more exercise.  2.  . Pain Management:  -Continue Xtampza 13.5 mg every 12 hours Continue Oxycodone 5mg  q6 prn, #120 to allow him 4 per day.   -We will continue the controlled substance monitoring program, this consists of regular clinic visits, examinations, routine drug screening, pill  counts as well as use of [place] Controlled Substance Reporting System. [place].                  -LEFT wrist--likely mild oa of carpals.    -voltaren, elevation, ice   -remove watch periodically 4. Mood: Continue Wellbutrin 300 mg daily, BuSpar 10 mg down to 2 times daily, Klonopin 0.5 mg nightly,              -continue klonopin for now.              -Seroquel 150 mg twice daily and 300 mg nightly              -mood seems very positive    5.  CAD/MI/DES.  Continue aspirin per cardiology.   6.  Acute hypercarbic ventilatory dependent  respiratory failure/COPD.  Tracheostomy 09/03/2020 per Dr. Janee Morn.   -ENT/pumonary  following.      -phonating better!              -  7.  BPH/urine retention: ew             -oob to void for all attempts              -Continue hyrtin, Flomax and Urecholine. voiding well with current regimen 8 Constipation---              Discussed his bowel regimen at length.             -Maintain softeners and laxatives.  Continue MiraLAX daily to daily as needed.             -senna and colace at bedtime.-- this regimen is working for him   20  minutes of face to face patient care time were spent during this visit. All questions were encouraged and answered. Follow up with in 2 months with nurse practitioner

## 2022-08-27 NOTE — Patient Instructions (Addendum)
ALWAYS FEEL FREE TO CALL OUR OFFICE WITH ANY PROBLEMS OR QUESTIONS [phone])  **PLEASE NOTE** ALL MEDICATION REFILL REQUESTS (INCLUDING CONTROLLED SUBSTANCES) NEED TO BE MADE AT LEAST 7 DAYS PRIOR TO REFILL BEING DUE. ANY REFILL REQUESTS INSIDE THAT TIME FRAME MAY RESULT IN DELAYS IN RECEIVING YOUR PRESCRIPTION.    TRY VOLTAREN GEL FOR YOUR WRIST 3X DAILY  ELEVATE LEFT ARM WHEN YOU'RE IN BED.   TAKE A BREAK FROM YOUR WRIST WATCH.

## 2022-09-08 NOTE — Progress Notes (Unsigned)
Charles Weeks, male    DOB: 03/21/1966,    MRN: [id number]   Brief patient profile:  81 yowm illiterate though says finished 7th grade with asthma since in his 68s  just MM/quit smoking 08/2020 on disability since last PFT's ? 2017/18 relocated to [facility] summer 2021 and referred to pulmonary clinic in [place]  01/24/2020 by [name].  Admit date: 11/04/2019 Discharge date: 11/10/2019   Admitted From: Home Disposition: Home   Recommendations for Outpatient Follow-up:  Follow up with PCP in 1-2 weeks Please obtain BMP/CBC in one week   Home Health: Equipment/Devices: Home oxygen at 3 L, nebulizer machine   Discharge Condition: Stable CODE STATUS: Full code Diet recommendation: Heart healthy   Brief/Interim Summary: Charles Weeks is a 57 y.o. male with a history of CAD, MI, COPD.  Patient just recently moved to [place] from [place].  He has no medications and has not seen a physician here.  He started having shortness of breath about 3 weeks ago which has been increasing.  He has been having cough with some purulent sputum production.  He had a relative give him some steroids over the past few days, without too much improvement.  No other palliating or provoking factors.  Denies fevers, chills, nausea, vomiting.   Emergency Department Course: Patient started on BiPAP and gradually weaned to nasal cannula.  Initial saturations were in the 80s.  Improved with steroids and BiPAP.  Remained stable on nasal cannula.   Discharge Diagnoses:  Principal Problem:   Acute respiratory failure with hypoxia (HCC) Active Problems:   Coronary artery disease   COPD with acute exacerbation (HCC)   Acute on chronic hypoxic respiratory failure secondary to acute COPD exacerbation Presented with hypoxia with O2 saturation in the 80s initially requiring NIV then transitioned to 3-4 L  -previously on 3L at home but has not used since Oct 2020 -Continue brovana and  pulmicort -Continue hypertonic saline nebs -continue duonebs -He was treated with IV steroids which have since been transitioned to prednisone taper -Personally reviewed chest x-ray which showed no lobular pulmonary infiltrates to suggest pneumonia -Maintain O2 saturation greater than 88 to 90% -TOC consulted to assist with PCP and pulmonology referrals -Currently, he can ambulate comfortably on 3 L of oxygen which appears to be near his baseline   -Acute COPD exacerbation Ongoing tobacco use; tobacco cessation counseling at bedside. COVID-19 negative Treated with IV steroids which was subsequently transitioned to prednisone taper Continue management as stated above -Continue Symbicort on discharge -Continue albuterol nebs as needed, provided with nebulizer machine   Hyperglycemia/impaired glucose tolerance Presented with elevated serum glucose Hemoglobin A1c 6.3 on 619 1221 Monitored on sliding scale insulin   Coronary artery disease status post PCI/history of MI Denies any anginal symptoms at the time of this exam Continue Brilinta  Not on statin, obtain lipid panel   Tobacco use disorder Tobacco cessation counseling Nicotine patch   BPH Continue terazosin Monitor urine output    History of Present Illness  01/24/2020  Pulmonary/ 1st office eval/ Miles Leyda / [place] [facility]  Chief Complaint  Patient presents with   Pulmonary Consult    Referred by Tereasa Coop, NP.  Pt states hx of COPD. Pt c/o increased SOB since Winter 2020. He sometimes gets winded walking room to room at home. He is using albuterol inhaler and neb both 1-2 x per day.   Dyspnea:  [employer] walking / walk from [facility] space (sisters) / no steps but there  are times he can't get across the room s sob  Cough: better since quit  Sleep: flat bed/ 1 pillow  - no resp  SABA use:neb  once or twice daily "can't make it thru the day" and also using 2 different forms of hfa  02  Prn  rec Plan A = Automatic = Always=     Symbicort 160 Take 2 puffs first thing in am and then another 2 puffs about 12 hours later.  Work on inhaler technique: Plan B = Backup (to supplement plan A, not to replace it) Only use your albuterol inhaler as a rescue medication Plan C = Crisis (instead of Plan B but only if Plan B stops working) - only use your albuterol nebulizer if you first try Plan B and it fails to help  Make sure you check your oxygen saturations at highest level of activity  Please schedule a follow up visit in 3 months with pfts on return      05/31/2020  f/u ov/[place] office/Lavonn Maxcy re: needs to go to lab / did stop smoking Chief Complaint  Patient presents with   Follow-up    Patient feels like breathing is the same since last visit. Cough but states that it is hard to get up mucus.   Dyspnea: basically housebound/ walks dog to porch / 25 ft bd Cough: non productive, last cig jun 2021 much better since then  Sleeping: flat bed/ no bipap/ wakes up feeling find SABA use: rarely neb,  Twice a daily proair never pre challenges or rechallenges  02: has it not using / not tracking sats with ex  Rec Plan A = Automatic = Always=    Symbicort 160 Take 2 puffs first thing in am and then another 2 puffs about 12 hours later.  Work on inhaler technique:     Plan B = Backup (to supplement plan A, not to replace it) Only use your albuterol inhaler as a rescue medication  Plan C = Crisis (instead of Plan B but only if Plan B stops working) - only use your albuterol nebulizer if you first try Plan B and it fails to help > ok to use the nebulizer up to every 4 hours but if start needing it regularly call for immediate appointment Make sure you check your oxygen saturations at highest level of activity to be sure it stays over 90%  Please remember to go to the lab and x-ray department at [facility]   for your tests - we will call you with the results when they are available. Please schedule a follow up visit in 3  months with pfts on return    Admitted August 17 2020 to [facility]  p neck fx > [facility] > d/c home with trach and PEG and f/u by Dr Jenne Pane who rec laser surgery on trach   12/24/2020  f/u ov/[place] office/Marleigh Kaylor re: GOLD IV post hosp  Chief Complaint  Patient presents with   Follow-up    Patient states that he would rather have Symbicort over [facility] inhaler feels like it does not work as good. Patient was admitted on 10/09/20. Sister states he will need surgical clearance today for his ENT Dr. Jenne Pane  Dyspnea:  mb and back 100 ft flat s 02  Cough: clear phlegm  Sleeping: bed is flat / on back one pillow SABA use: neb twice daily  02: 4lpm  Covid status: 2 vax  Rec Ok to change dulera to symbicort 160  Take 2 puffs first thing in am and then another 2 puffs about 12 hours later.  You are cleared for surgery > Jenne Pane  02/06/21 balloon dilatation/ no laser       03/12/2022  f/u ov/[place] office/Jillyn Stacey re: 02 dep/ trach dep  maint on trach collar 3lpm   On formoterol bid but  no budesonide due to shortage  Chief Complaint  Patient presents with   Follow-up    Breathing is doing some days, not so good other days.  Pts sister has picture of mucus coughed up to show Dr. Sherene Sires today   Dyspnea:  50 ft / spends a lot of time sitting on side of bed - 4-5 lpm with ex to keep > 90%  Cough: sometimes quite thick brown sev days prior to OV not consistent   Sleeping: hosp bed at 30 degrees  SABA use: occ hfa  02: 3lpm  Rec If breathing worse > Prednisone 10 mg take  4 each am x 2 days,   2 each am x 2 days,  1 each am x 2 days and stop  Maximum mucinex 1200 mg twice daily and check with 02 company for a different humidity if available and also check with Dr Jenne Pane if this continues to be a problem   09/09/2022  f/u ov/ office/Baleigh Rennaker re:  trach dep/ AB budesonide/performist   Chief Complaint  Patient presents with   Follow-up    Pt f/u states that he is doing ok, currently using 1L O2 and sats  are above 90%. He does increase O2 to 3L w/ movement. No questions or concerns  Dyspnea:  bathroom and back  Cough: varies sometimes dark  Sleeping: bed is flat/ one pillow  SABA use: once daily  02: 3lpm    No obvious day to day or daytime variability or assoc   mucus plugs or hemoptysis or cp or chest tightness, subjective wheeze or overt sinus or hb symptoms.   Sleeping flat  without nocturnal  or early am exacerbation  of respiratory  c/o's or need for noct saba. Also denies any obvious fluctuation of symptoms with weather or environmental changes or other aggravating or alleviating factors except as outlined above   No unusual exposure hx or h/o childhood pna/ asthma or knowledge of premature birth.  Current Allergies, Complete Past Medical History, Past Surgical History, Family History, and Social History were reviewed in [employer] record.  ROS  The following are not active complaints unless bolded Hoarseness, sore throat, dysphagia, dental problems, itching, sneezing,  nasal congestion or discharge of excess mucus or purulent secretions, ear ache,   fever, chills, sweats, unintended wt loss or wt gain, classically pleuritic or exertional cp,  orthopnea pnd or arm/hand swelling  or leg swelling, presyncope, palpitations, abdominal pain, anorexia, nausea, vomiting, diarrhea  or change in bowel habits or change in bladder habits, change in stools or change in urine, dysuria, hematuria,  rash, arthralgias, visual complaints, headache, numbness, weakness or ataxia or problems with walking or coordination,  change in mood or  memory.        Current Meds  Medication Sig   acetaminophen (TYLENOL) 325 MG tablet Take 2 tablets (650 mg total) by mouth every 6 (six) hours as needed for mild pain or moderate pain.   albuterol (VENTOLIN HFA) 108 (90 Base) MCG/ACT inhaler INHALE 1 OR 2 PUFFS INTO THE LUNGS EVERY 4 HOURS AS NEEDED FOR WHEEZING OR SHORTNESS OF BREATH   aspirin  81 MG chewable tablet Chew 1 tablet (81 mg total) by mouth daily.   bethanechol (URECHOLINE) 25 MG tablet TAKE 2 TABLETS BY MOUTH (4) TIMES DAILY   budesonide (PULMICORT) 0.25 MG/2ML nebulizer solution 2.5 mg  twice daily with performist   buPROPion (WELLBUTRIN XL) 300 MG 24 hr tablet Take 1 tablet (300 mg total) by mouth daily.   busPIRone (BUSPAR) 10 MG tablet Take 1 tablet (10 mg total) by mouth 3 (three) times daily. (Patient taking differently: Take 10 mg by mouth 2 (two) times daily.)   Calcium Carb-Cholecalciferol (CALCIUM 600/VITAMIN D) 600-400 MG-UNIT CHEW Chew 1 each by mouth daily.   Cholecalciferol (VITAMIN D3) 125 MCG (5000 UT) CAPS Take 5,000 Units by mouth daily.   clonazePAM (KLONOPIN) 0.5 MG tablet TAKE 1 TABLET BY MOUTH AT BEDTIME.   Docusate Sodium (STOOL SOFTENER LAXATIVE PO) Take 200 mg by mouth daily. 2-4 tablets   folic acid (FOLVITE) 1 MG tablet TAKE ONE TABLET BY MOUTH ONCE DAILY.   formoterol (PERFOROMIST) 20 MCG/2ML nebulizer solution Take 2 mLs (20 mcg total) by nebulization 2 (two) times daily. Use in nebulizer twice daily perfectly regularly   Misc. Devices (TRANSPORT CHAIR) MISC For transporting to and from Doctors appointments   nitroGLYCERIN (NITROSTAT) 0.4 MG SL tablet Place 1 tablet (0.4 mg total) under the tongue every 5 (five) minutes as needed for chest pain.   ondansetron (ZOFRAN) 4 MG tablet Take 1 tablet (4 mg total) by mouth every 8 (eight) hours as needed for nausea or vomiting.   oxyCODONE (OXY IR/ROXICODONE) 5 MG immediate release tablet Take 1 tablet (5 mg total) by mouth 4 (four) times daily as needed for moderate pain.   oxyCODONE ER (XTAMPZA ER) 13.5 MG C12A Take 1 capsule by mouth every 12 (twelve) hours.   OXYGEN Inhale 3 L into the lungs continuous.   polyethylene glycol powder (GLYCOLAX/MIRALAX) 17 GM/SCOOP powder Take 255 g by mouth daily.   predniSONE (DELTASONE) 5 MG tablet    QUEtiapine (SEROQUEL) 300 MG tablet TAKE (1/2) A TABLET BY MOUTH  TWICE A DAY, AND (1) TABLET AT BEDTIME.   QUEtiapine (SEROQUEL) 50 MG tablet    rosuvastatin (CRESTOR) 5 MG tablet TAKE ONE TABLET BY MOUTH ONCE DAILY   tamsulosin (FLOMAX) 0.4 MG CAPS capsule Take 1 capsule (0.4 mg total) by mouth daily.   terazosin (HYTRIN) 5 MG capsule Take 5 mg by mouth at bedtime.             Past Medical History:  Diagnosis Date   Acute respiratory failure with hypoxia (HCC) 11/04/2019   Anxiety    Arthritis    COPD (chronic obstructive pulmonary disease) (HCC)    COPD with acute exacerbation (HCC) 11/04/2019   Coronary artery disease    Depression    Emphysema of lung (HCC)    Hypertension    Myocardial infarct [facility])    Oxygen deficiency       Objective:   wts  09/09/2022      197  03/12/2022    203  12/18/2021        194  06/19/2021        182 12/24/2020        181  05/31/20 194 lb 12.8 oz (88.4 kg)  03/06/20 186 lb (84.4 kg)  01/27/20 186 lb (84.4 kg)   Vital signs reviewed  09/09/2022  - Note at rest 02 sats  91% on 3lpm    General appearance:    w/c bound wm >  stated age   HEENT: orophx clear      NECK :  without  apparent JVD/ palpable Nodes/TM / tach in good shape, no speaking valve in place (uses finger   LUNGS: no acc muscle use,  Mild barrel  contour chest wall with bilateral  Distant bs s audible wheeze and  without cough on insp or exp maneuvers  and mild  Hyperresonant  to  percussion bilaterally     CV:  RRR  no s3 or murmur or increase in P2, and no edema   ABD:  soft and nontender with pos end  insp Hoover's  in the supine position.  No bruits or organomegaly appreciated   MS:  Nl gait/ ext warm without deformities Or obvious joint restrictions  calf tenderness, cyanosis or clubbing     SKIN: warm and dry without lesions    NEURO:  alert, approp, nl sensorium with  no motor or cerebellar deficits apparent.        Assessment

## 2022-09-09 ENCOUNTER — Encounter: Payer: Self-pay | Admitting: Internal Medicine

## 2022-09-09 ENCOUNTER — Ambulatory Visit (INDEPENDENT_AMBULATORY_CARE_PROVIDER_SITE_OTHER): Payer: Medicaid Other | Admitting: Internal Medicine

## 2022-09-09 VITALS — BP 128/78 | HR 89 | Ht 68.5 in | Wt 197.0 lb

## 2022-09-09 DIAGNOSIS — J449 Chronic obstructive pulmonary disease, unspecified: Secondary | ICD-10-CM

## 2022-09-09 DIAGNOSIS — J9611 Chronic respiratory failure with hypoxia: Secondary | ICD-10-CM

## 2022-09-09 NOTE — Assessment & Plan Note (Signed)
Quit smoking 08/2020/ MM - 01/24/2020  After extensive coaching inhaler device,  effectiveness =    75% (short Ti) > change symb 80 to 160 2bid  -  PFT's   05/01/20   FEV1 0.97 (27 % ) ratio 0.30  p 1 % improvement from saba p symbicort prior to study with DLCO  11.59 (43%) corrects to 2.0 (45%)  for alv volume and FV curve severe airflow obst with 35% improvement in FVC p saba  - 05/31/2020  After extensive coaching inhaler device,  effectiveness =    Short Ti, prostate problems so rx symbicort 160 2bid and prn saba -  05/31/2020   Walked RA  approx   300 ft  @ moderate pace  stopped due to  Sob at end but sats still 94%    - Labs ordered 12/24/2020  :    alpha one AT phenotype  MM level 154   COPD/AB well compensated on laba/ics so no changes needed    Re SABA :  I spent extra time with pt today reviewing appropriate use of albuterol for prn use on exertion with the following points: 1) saba is for relief of sob that does not improve by walking a slower pace or resting but rather if the pt does not improve after trying this first. 2) If the pt is convinced, as many are, that saba helps recover from activity faster then it's easy to tell if this is the case by re-challenging : ie stop, take the inhaler, then p 5 minutes try the exact same activity (intensity of workload) that just caused the symptoms and see if they are substantially diminished or not after saba 3) if there is an activity that reproducibly causes the symptoms, try the saba 15 min before the activity on alternate days   If in fact the saba really does help, then fine to continue to use it prn but advised may need to look closer at the maintenance regimen being used to achieve better control of airways disease with exertion.

## 2022-09-09 NOTE — Assessment & Plan Note (Signed)
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 Patient wanted to stop walk after 163ft due to SOB and being unsteady with lowest sats 94%   Well compensated on 3lpm trach collar  Advised: Make sure you check your oxygen saturation  AT  your highest level of activity (not after you stop)   to be sure it stays over 90% and adjust  02 flow upward to maintain this level if needed but remember to turn it back to previous settings when you stop (to conserve your supply).   F/u can be q 6 m, with all trach care per Dr Jenne Pane in meantime         Each maintenance medication was reviewed in detail including emphasizing most importantly the difference between maintenance and prns and under what circumstances the prns are to be triggered using an action plan format where appropriate.  Total time for H and P, chart review, counseling, reviewing neb/02 device(s) and generating customized AVS unique to this office visit / same day charting =  31 min

## 2022-09-09 NOTE — Patient Instructions (Signed)
No change in medications/ all trach care per Dr Jenne Pane   Please schedule a follow up visit in 6  months but call sooner if needed

## 2022-09-10 ENCOUNTER — Ambulatory Visit: Payer: Medicaid Other | Admitting: Internal Medicine

## 2022-09-15 ENCOUNTER — Other Ambulatory Visit (INDEPENDENT_AMBULATORY_CARE_PROVIDER_SITE_OTHER): Payer: Self-pay | Admitting: Internal Medicine

## 2022-09-15 DIAGNOSIS — R110 Nausea: Secondary | ICD-10-CM

## 2022-09-16 ENCOUNTER — Ambulatory Visit: Payer: Medicaid Other | Admitting: Cardiology

## 2022-09-16 NOTE — Progress Notes (Deleted)
    Cardiology Office Note  Date: 09/16/2022   ID: Charles Weeks, DOB 04/05/1966, MRN [id number]  History of Present Illness: Charles Weeks is a 57 y.o. male last seen in October 2023.  Physical Exam: VS:  There were no vitals taken for this visit., BMI There is no height or weight on file to calculate BMI.  Wt Readings from Last 3 Encounters:  09/09/22 197 lb (89.4 kg)  08/27/22 196 lb (88.9 kg)  07/10/22 197 lb 12.8 oz (89.7 kg)    General: Patient appears comfortable at rest. HEENT: Conjunctiva and lids normal, oropharynx clear with moist mucosa. Neck: Supple, no elevated JVP or carotid bruits, no thyromegaly. Lungs: Clear to auscultation, nonlabored breathing at rest. Cardiac: Regular rate and rhythm, no S3 or significant systolic murmur, no pericardial rub. Abdomen: Soft, nontender, no hepatomegaly, bowel sounds present, no guarding or rebound. Extremities: No pitting edema, distal pulses 2+. Skin: Warm and dry. Musculoskeletal: No kyphosis. Neuropsychiatric: Alert and oriented x3, affect grossly appropriate.  ECG:  An ECG dated 03/05/2022 was personally reviewed today and demonstrated:  Sinus rhythm with nonspecific ST changes.  Labwork: 11/28/2021: Magnesium 1.9 12/04/2021: ALT 11; AST 12; BUN 10; Creatinine, Ser 0.90; Potassium 4.7; Sodium 143 01/02/2022: Hemoglobin 14.7; Platelets 226     Component Value Date/Time   CHOL 137 03/05/2022 1215   TRIG 298 (H) 03/05/2022 1215   HDL 42 03/05/2022 1215   CHOLHDL 3.3 03/05/2022 1215   VLDL 60 (H) 03/05/2022 1215   LDLCALC 35 03/05/2022 1215   Other Studies Reviewed Today:  No interval cardiac testing for review today.  Assessment and Plan:  1.  CAD status post DES to the RCA in 2020.  Lexiscan Myoview in September 2022 showed evidence of prior inferior infarct scar but no active ischemia and LVEF 50%.  2.  Mixed hyperlipidemia on Crestor.  LDL 35 in October 2023.  3.  Essential hypertension.  Disposition:   Follow up {follow up:15908}  Signed, Jonelle Sidle, M.D., F.A.C.C. [facility]

## 2022-09-22 ENCOUNTER — Encounter: Payer: Self-pay | Admitting: Internal Medicine

## 2022-09-25 ENCOUNTER — Other Ambulatory Visit (HOSPITAL_COMMUNITY): Payer: Self-pay

## 2022-09-29 ENCOUNTER — Encounter: Payer: Self-pay | Admitting: Physical Medicine & Rehabilitation

## 2022-09-30 ENCOUNTER — Other Ambulatory Visit: Payer: Self-pay | Admitting: Physical Medicine & Rehabilitation

## 2022-10-06 ENCOUNTER — Ambulatory Visit: Payer: Medicaid Other | Admitting: Medical

## 2022-10-16 ENCOUNTER — Ambulatory Visit: Payer: Medicaid Other | Admitting: Internal Medicine

## 2022-10-23 ENCOUNTER — Ambulatory Visit (INDEPENDENT_AMBULATORY_CARE_PROVIDER_SITE_OTHER): Payer: Medicaid Other | Admitting: Internal Medicine

## 2022-10-23 VITALS — BP 112/71 | HR 100 | Ht 68.0 in

## 2022-10-23 DIAGNOSIS — F3341 Major depressive disorder, recurrent, in partial remission: Secondary | ICD-10-CM | POA: Diagnosis not present

## 2022-10-23 DIAGNOSIS — J449 Chronic obstructive pulmonary disease, unspecified: Secondary | ICD-10-CM | POA: Diagnosis not present

## 2022-10-23 DIAGNOSIS — Z930 Tracheostomy status: Secondary | ICD-10-CM

## 2022-10-23 DIAGNOSIS — N401 Enlarged prostate with lower urinary tract symptoms: Secondary | ICD-10-CM

## 2022-10-23 DIAGNOSIS — R739 Hyperglycemia, unspecified: Secondary | ICD-10-CM

## 2022-10-23 DIAGNOSIS — F411 Generalized anxiety disorder: Secondary | ICD-10-CM

## 2022-10-23 DIAGNOSIS — R338 Other retention of urine: Secondary | ICD-10-CM

## 2022-10-23 DIAGNOSIS — J9611 Chronic respiratory failure with hypoxia: Secondary | ICD-10-CM

## 2022-10-23 DIAGNOSIS — F418 Other specified anxiety disorders: Secondary | ICD-10-CM

## 2022-10-23 MED ORDER — ALBUTEROL SULFATE HFA 108 (90 BASE) MCG/ACT IN AERS: INHALATION_SPRAY | RESPIRATORY_TRACT | 5 refills | Status: DC

## 2022-10-23 MED ORDER — BUSPIRONE HCL 10 MG PO TABS: 10.0000 mg | ORAL_TABLET | Freq: Two times a day (BID) | ORAL | 5 refills | Status: DC

## 2022-10-23 MED ORDER — BUPROPION HCL ER (XL) 300 MG PO TB24: 300.0000 mg | ORAL_TABLET | Freq: Every day | ORAL | 5 refills | Status: DC

## 2022-10-23 NOTE — Assessment & Plan Note (Signed)
Well controlled with tamsulosin and bethanechol DC terazosin is he is already taking tamsulosin 

## 2022-10-23 NOTE — Patient Instructions (Addendum)
Please stop taking Terazosin. Continue Tamsulosin and Bethanechol as prescribed for urinary incontinence.  Please continue taking Buspirone only twice daily.  Please continue to take other medications as prescribed.  Please continue to follow low carb diet and ambulate as tolerated.  Please bring the home medications in the next visit.

## 2022-10-23 NOTE — Progress Notes (Signed)
Established Patient Office Visit  Subjective:  Patient ID: Charles Weeks, male    DOB: 1965/10/04  Age: 57 y.o. MRN: [id number]  CC:  Chief Complaint  Patient presents with   COPD    Four month follow up     HPI [name] is a 57 y.o. 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  COPD: He has been placed on Pulmicort and Perforomist for it.  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  He currently denies any fever, chills or wheezing.   Depression with anxiety: He is on Wellbutrin, BuSpar, Seroquel and Klonopin.  Denies any spells of panic/anxiety and insomnia.   He has a hospital bed at home now.  He has a 24-hour caregiver (his niece).    Past Medical History:  Diagnosis Date   Anxiety 01/19/2020   Arthritis    Asthma    Benign prostatic hyperplasia with weak urinary stream 12/08/2019   BPH (benign prostatic hyperplasia) 08/17/2020   Chronic respiratory failure with hypoxia (HCC) 01/24/2020   Has home 02 but not using as of 01/24/2020  -  01/24/2020   Walked RA  approx   200 ft  @ slow pace  stopped due to  Dizzy with sats still 95%      Cigarette smoker 01/24/2020   Stopped regular smoking 10/2019 but still "now and then" as of 01/24/2020    COPD (chronic obstructive pulmonary disease) (HCC) 08/17/2020   gold   COPD GOLD ?     Quit smoking 10/2019  - Labs ordered 01/24/2020  :  allergy profile   alpha one AT phenotype   - 01/24/2020  After extensive coaching inhaler device,  effectiveness =    75% (short Ti) > change symb 80 to 160 2bid      Coronary artery disease 06/08/2018   DES to RCA, [facility] [place] [place] [place])   Depression, major, single episode, moderate (HCC) 01/19/2020   Dyspnea    Encounter for screening for malignant neoplasm of colon 12/08/2019   Essential hypertension  12/08/2019   History of MI (myocardial infarction) 01/19/2020   HTN (hypertension) 08/17/2020   Myocardial infarct (HCC) 2019   Pneumonia    Positive colorectal cancer screening using Cologuard test     Past Surgical History:  Procedure Laterality Date   BALLOON DILATION N/A 02/06/2021   Procedure: BALLOON DILATION;  Surgeon: Christia Reading, MD;  Location[facility] OR;  Service: ENT;  Laterality: N/A;   BOWEL RESECTION  2006   MICROLARYNGOSCOPY WITH LASER AND BALLOON DILATION N/A 02/06/2021   Procedure: SUSPENDED MICRO DIRECT LARYNGOSCOPY;  Surgeon: Christia Reading, MD;  Location: [facility] OR;  Service: ENT;  Laterality: N/A;   PEG PLACEMENT N/A 09/03/2020   Procedure: PERCUTANEOUS ENDOSCOPIC GASTROSTOMY (PEG) PLACEMENT;  Surgeon: Violeta Gelinas, MD;  Location: [facility] OR;  Service: General;  Laterality: N/A;   PERCUTANEOUS TRACHEOSTOMY N/A 09/03/2020   Procedure: PERCUTANEOUS TRACHEOSTOMY USING 6 SHILEY;  Surgeon: Violeta Gelinas, MD;  Location: [facility] OR;  Service: General;  Laterality: N/A;   SKIN GRAFT     TONSILLECTOMY AND ADENOIDECTOMY      Family History  Problem Relation Age of Onset   Heart disease Mother    Heart disease Father    Diabetes Sister     Social History   Socioeconomic History   Marital status: Divorced  Spouse name: Not on file   Number of children: 2   Years of education: Not on file   Highest education level: 7th grade  Occupational History   Not on file  Tobacco Use   Smoking status: Former    Packs/day: 1.5    Types: Cigarettes    Quit date: 08/17/2020    Years since quitting: 2.1   Smokeless tobacco: Never  Vaping Use   Vaping Use: Never used  Substance and Sexual Activity   Alcohol use: Not Currently   Drug use: Not Currently   Sexual activity: Not on file  Other Topics Concern   Not on file  Social History Narrative   ** Merged History Encounter **       Lives with sister  Both daughters live close by   Sister has dog   Enjoys: shooting pool, fishing,  Therapist, nutritional  Diet: eats all food groups Caffeine: pop-dr pepper 2 liter  Water: 4-6 cups   Wears seat belt  No driving for him Psychologist, sport and exercise  Fi   re Extinguisher No weapons        Social Determinants of Health   Financial Resource Strain: Low Risk  (10/23/2022)   Overall Financial Resource Strain (CARDIA)    Difficulty of Paying Living Expenses: Not very hard  Food Insecurity: No Food Insecurity (10/23/2022)   Hunger Vital Sign    Worried About Running Out of Food in the Last Year: Never true    Ran Out of Food in the Last Year: Never true  Transportation Needs: No Transportation Needs (10/23/2022)   PRAPARE - Administrator, Civil Service (Medical): No    Lack of Transportation (Non-Medical): No  Physical Activity: Unknown (10/23/2022)   Exercise Vital Sign    Days of Exercise per Week: 0 days    Minutes of Exercise per Session: Not on file  Stress: No Stress Concern Present (10/23/2022)   [employer] of Occupational Health - Occupational Stress Questionnaire    Feeling of Stress : Only a little  Social Connections: Socially Isolated (10/23/2022)   Social Connection and Isolation Panel [NHANES]    Frequency of Communication with Friends and Family: Once a week    Frequency of Social Gatherings with Friends and Family: More than three times a week    Attends Religious Services: Never    Database administrator or Organizations: No    Attends Engineer, structural: Not on file    Marital Status: Divorced  Intimate Partner Violence: Not At Risk (12/08/2019)   Humiliation, Afraid, Rape, and Kick questionnaire    Fear of Current or Ex-Partner: No    Emotionally Abused: No    Physically Abused: No    Sexually Abused: No    Outpatient Medications Prior to Visit  Medication Sig Dispense Refill   acetaminophen (TYLENOL) 325 MG tablet Take 2 tablets (650 mg total) by mouth every 6 (six) hours as needed for mild pain or moderate pain.     aspirin 81 MG chewable  tablet Chew 1 tablet (81 mg total) by mouth daily.     bethanechol (URECHOLINE) 25 MG tablet TAKE 2 TABLETS BY MOUTH (4) TIMES DAILY 240 tablet 0   budesonide (PULMICORT) 0.25 MG/2ML nebulizer solution 2.5 mg  twice daily with performist 120 mL 12   Calcium Carb-Cholecalciferol (CALCIUM 600/VITAMIN D) 600-400 MG-UNIT CHEW Chew 1 each by mouth daily.     Cholecalciferol (VITAMIN D3) 125 MCG (5000 UT) CAPS Take 5,000 Units  by mouth daily.     clonazePAM (KLONOPIN) 0.5 MG tablet TAKE 1 TABLET BY MOUTH AT BEDTIME. 30 tablet 2   Docusate Sodium (STOOL SOFTENER LAXATIVE PO) Take 200 mg by mouth daily. 2-4 tablets     folic acid (FOLVITE) 1 MG tablet TAKE ONE TABLET BY MOUTH ONCE DAILY. 90 tablet 3   formoterol (PERFOROMIST) 20 MCG/2ML nebulizer solution Take 2 mLs (20 mcg total) by nebulization 2 (two) times daily. Use in nebulizer twice daily perfectly regularly 120 mL 11   Misc. Devices ([employer]) MISC For transporting to and from Doctors appointments 1 each 0   montelukast (SINGULAIR) 10 MG tablet Take 1 tablet (10 mg total) by mouth at bedtime. (Patient not taking: Reported on 09/09/2022) 90 tablet 3   nitroGLYCERIN (NITROSTAT) 0.4 MG SL tablet Place 1 tablet (0.4 mg total) under the tongue every 5 (five) minutes as needed for chest pain. 25 tablet 2   ondansetron (ZOFRAN) 4 MG tablet TAKE (1) TABLET BY MOUTH EVERY EIGHT HOURS AS NEEDED. 30 tablet 1   oxyCODONE (OXY IR/ROXICODONE) 5 MG immediate release tablet Take 1 tablet (5 mg total) by mouth 4 (four) times daily as needed for moderate pain. 120 tablet 0   oxyCODONE ER (XTAMPZA ER) 13.5 MG C12A Take 1 capsule by mouth every 12 (twelve) hours. 60 capsule 0   OXYGEN Inhale 3 L into the lungs continuous.     pantoprazole (PROTONIX) 40 MG tablet Take 1 tablet (40 mg total) by mouth daily. (Patient not taking: Reported on 09/09/2022) 90 tablet 1   polyethylene glycol powder (GLYCOLAX/MIRALAX) 17 GM/SCOOP powder Take 255 g by mouth daily. 850 g 5    QUEtiapine (SEROQUEL) 300 MG tablet TAKE (1/2) A TABLET BY MOUTH TWICE A DAY, AND (1) TABLET AT BEDTIME. 60 tablet 5   rosuvastatin (CRESTOR) 5 MG tablet TAKE ONE TABLET BY MOUTH ONCE DAILY 90 tablet 3   tamsulosin (FLOMAX) 0.4 MG CAPS capsule Take 1 capsule (0.4 mg total) by mouth daily. 60 capsule 6   albuterol (VENTOLIN HFA) 108 (90 Base) MCG/ACT inhaler INHALE 1 OR 2 PUFFS INTO THE LUNGS EVERY 4 HOURS AS NEEDED FOR WHEEZING OR SHORTNESS OF BREATH 18 g 0   buPROPion (WELLBUTRIN XL) 300 MG 24 hr tablet Take 1 tablet (300 mg total) by mouth daily. 30 tablet 5   busPIRone (BUSPAR) 10 MG tablet Take 1 tablet (10 mg total) by mouth 3 (three) times daily. (Patient taking differently: Take 10 mg by mouth 2 (two) times daily.) 90 tablet 5   mometasone-formoterol (DULERA) 200-5 MCG/ACT AERO  (Patient not taking: Reported on 09/09/2022)     predniSONE (DELTASONE) 5 MG tablet      QUEtiapine (SEROQUEL) 50 MG tablet      scopolamine (TRANSDERM SCOP, 1.5 MG,) 1 MG/3DAYS  (Patient not taking: Reported on 09/09/2022)     terazosin (HYTRIN) 5 MG capsule Take 5 mg by mouth at bedtime.     No facility-administered medications prior to visit.    Allergies  Allergen Reactions   Ciprofloxacin Hives    ROS Review of Systems  Constitutional:  Negative for chills and fever.  HENT:  Negative for congestion and sore throat.   Eyes:  Negative for pain and discharge.  Respiratory:  Negative for cough and shortness of breath.   Cardiovascular:  Negative for chest pain and palpitations.  Gastrointestinal:  Positive for constipation and nausea. Negative for diarrhea and vomiting.  Endocrine: Negative for polydipsia and polyuria.  Genitourinary:  Positive for difficulty urinating. Negative for dysuria and hematuria.  Musculoskeletal:  Positive for arthralgias, back pain, gait problem, myalgias and neck pain.  Skin:        Left great toe pain and swelling  Neurological:  Negative for dizziness, weakness, numbness  and headaches.  Psychiatric/Behavioral:  Positive for decreased concentration and sleep disturbance. Negative for behavioral problems.       Objective:    Physical Exam Vitals reviewed.  Constitutional:      General: He is not in acute distress.    Appearance: He is not diaphoretic.  HENT:     Head: Normocephalic and atraumatic.     Nose: Nose normal.     Mouth/Throat:     Mouth: Mucous membranes are moist.  Eyes:     General: No scleral icterus.    Extraocular Movements: Extraocular movements intact.  Cardiovascular:     Rate and Rhythm: Normal rate and regular rhythm.     Pulses: Normal pulses.     Heart sounds: Normal heart sounds. No murmur heard. Pulmonary:     Breath sounds: Normal breath sounds. No wheezing or rales.     Comments: Tracheostomy in place Musculoskeletal:     Cervical back: Neck supple. No tenderness.     Right lower leg: No edema.     Left lower leg: No edema.  Skin:    General: Skin is warm.  Neurological:     General: No focal deficit present.     Mental Status: He is alert and oriented to person, place, and time.     Sensory: No sensory deficit.     Motor: No weakness.  Psychiatric:        Mood and Affect: Mood normal.        Behavior: Behavior normal.     BP 112/71 (BP Location: Left Arm, Patient Position: Sitting, Cuff Size: Normal)   Pulse 100   Ht 5\' 8"  (1.727 m)   SpO2 92%   BMI 29.95 kg/m  Wt Readings from Last 3 Encounters:  09/09/22 197 lb (89.4 kg)  08/27/22 196 lb (88.9 kg)  07/10/22 197 lb 12.8 oz (89.7 kg)    No results found for: "TSH" Lab Results  Component Value Date   WBC 8.6 01/02/2022   HGB 14.7 01/02/2022   HCT 43.6 01/02/2022   MCV 90 01/02/2022   PLT 226 01/02/2022   Lab Results  Component Value Date   NA 143 12/04/2021   K 4.7 12/04/2021   CO2 28 12/04/2021   GLUCOSE 135 (H) 12/04/2021   BUN 10 12/04/2021   CREATININE 0.90 12/04/2021   BILITOT 0.2 12/04/2021   ALKPHOS 121 12/04/2021   AST 12  12/04/2021   ALT 11 12/04/2021   PROT 6.9 12/04/2021   ALBUMIN 4.4 12/04/2021   CALCIUM 9.5 12/04/2021   ANIONGAP 4 (L) 11/28/2021   EGFR 100 12/04/2021   Lab Results  Component Value Date   CHOL 137 03/05/2022   Lab Results  Component Value Date   HDL 42 03/05/2022   Lab Results  Component Value Date   LDLCALC 35 03/05/2022   Lab Results  Component Value Date   TRIG 298 (H) 03/05/2022   Lab Results  Component Value Date   CHOLHDL 3.3 03/05/2022   Lab Results  Component Value Date   HGBA1C 6.3 (H) 11/22/2021      Assessment & Plan:   Problem List Items Addressed This Visit       Respiratory  COPD GOLD 4/ 02 dep     Well-controlled On Pulmicort, Perforomist and PRN Albuterol F/u with Pulmonology      Relevant Medications   albuterol (VENTOLIN HFA) 108 (90 Base) MCG/ACT inhaler   Chronic respiratory failure with hypoxia (HCC)    Due to COPD Home O2 PRN Well-controlled with Pulmicort, Perforomist and PRN Albuterol F/u with Pulmonology        Genitourinary   Urinary retention due to benign prostatic hyperplasia    Well controlled with tamsulosin and bethanechol DC terazosin is he is already taking tamsulosin        Other   Tracheostomy in place [facility])    S/p prolonged intubation Followed by Pulmonology and [facility]      MDD (major depressive disorder) - Primary    Well-controlled with Seroquel, Wellbutrin, Buspar and Clonazepam Tried Psychiatry referral, but was not able to see them Klonopin currently refilled by [facility] Will try to taper BuSpar, BID for now      Relevant Medications   buPROPion (WELLBUTRIN XL) 300 MG 24 hr tablet   busPIRone (BUSPAR) 10 MG tablet   Other Relevant Orders   CMP14+EGFR   CBC with Differential/Platelet   TSH + free T4   GAD (generalized anxiety disorder)    Well-controlled with Seroquel, Wellbutrin, Buspar and Clonazepam Klonopin currently refilled by [facility] Will try to taper BuSpar, BID for now      Relevant  Medications   buPROPion (WELLBUTRIN XL) 300 MG 24 hr tablet   busPIRone (BUSPAR) 10 MG tablet   Other Visit Diagnoses     Hyperglycemia       Relevant Orders   Hemoglobin A1c       Meds ordered this encounter  Medications   albuterol (VENTOLIN HFA) 108 (90 Base) MCG/ACT inhaler    Sig: INHALE 1 OR 2 PUFFS INTO THE LUNGS EVERY 4 HOURS AS NEEDED FOR WHEEZING OR SHORTNESS OF BREATH    Dispense:  18 g    Refill:  5   buPROPion (WELLBUTRIN XL) 300 MG 24 hr tablet    Sig: Take 1 tablet (300 mg total) by mouth daily.    Dispense:  30 tablet    Refill:  5   busPIRone (BUSPAR) 10 MG tablet    Sig: Take 1 tablet (10 mg total) by mouth 2 (two) times daily.    Dispense:  60 tablet    Refill:  5    Follow-up: Return in about 3 months (around 01/23/2023) for COPD and MDD.    Anabel Halon, MD

## 2022-10-24 DIAGNOSIS — F411 Generalized anxiety disorder: Secondary | ICD-10-CM | POA: Insufficient documentation

## 2022-10-24 LAB — CBC WITH DIFFERENTIAL/PLATELET
Basophils Absolute: 0 10*3/uL (ref 0.0–0.2)
Basos: 0 %
EOS (ABSOLUTE): 0.4 10*3/uL (ref 0.0–0.4)
Eos: 6 %
Hematocrit: 42.6 % (ref 37.5–51.0)
Hemoglobin: 14 g/dL (ref 13.0–17.7)
Immature Grans (Abs): 0 10*3/uL (ref 0.0–0.1)
Immature Granulocytes: 0 %
Lymphocytes Absolute: 1.6 10*3/uL (ref 0.7–3.1)
Lymphs: 22 %
MCH: 29.8 pg (ref 26.6–33.0)
MCHC: 32.9 g/dL (ref 31.5–35.7)
MCV: 91 fL (ref 79–97)
Monocytes Absolute: 0.6 10*3/uL (ref 0.1–0.9)
Monocytes: 8 %
Neutrophils Absolute: 4.5 10*3/uL (ref 1.4–7.0)
Neutrophils: 64 %
Platelets: 214 10*3/uL (ref 150–450)
RBC: 4.7 x10E6/uL (ref 4.14–5.80)
RDW: 12.7 % (ref 11.6–15.4)
WBC: 7.1 10*3/uL (ref 3.4–10.8)

## 2022-10-24 LAB — CMP14+EGFR
ALT: 20 IU/L (ref 0–44)
AST: 19 IU/L (ref 0–40)
Albumin/Globulin Ratio: 2.1 (ref 1.2–2.2)
Albumin: 4.4 g/dL (ref 3.8–4.9)
Alkaline Phosphatase: 117 IU/L (ref 44–121)
BUN/Creatinine Ratio: 9 (ref 9–20)
BUN: 8 mg/dL (ref 6–24)
Bilirubin Total: 0.3 mg/dL (ref 0.0–1.2)
CO2: 28 mmol/L (ref 20–29)
Calcium: 9.6 mg/dL (ref 8.7–10.2)
Chloride: 100 mmol/L (ref 96–106)
Creatinine, Ser: 0.91 mg/dL (ref 0.76–1.27)
Globulin, Total: 2.1 g/dL (ref 1.5–4.5)
Glucose: 131 mg/dL — ABNORMAL HIGH (ref 70–99)
Potassium: 4.2 mmol/L (ref 3.5–5.2)
Sodium: 143 mmol/L (ref 134–144)
Total Protein: 6.5 g/dL (ref 6.0–8.5)
eGFR: 98 mL/min/{1.73_m2} (ref >59)

## 2022-10-24 LAB — TSH+FREE T4
Free T4: 0.81 ng/dL — ABNORMAL LOW (ref 0.82–1.77)
TSH: 2.59 u[IU]/mL (ref 0.450–4.500)

## 2022-10-24 LAB — HEMOGLOBIN A1C
Est. average glucose Bld gHb Est-mCnc: 140 mg/dL
Hgb A1c MFr Bld: 6.5 % — ABNORMAL HIGH (ref 4.8–5.6)

## 2022-10-24 NOTE — Assessment & Plan Note (Signed)
Well-controlled with Seroquel, Wellbutrin, Buspar and Clonazepam Tried Psychiatry referral, but was not able to see them Klonopin currently refilled by [facility] Will try to taper BuSpar, BID for now 

## 2022-10-24 NOTE — Assessment & Plan Note (Signed)
S/p prolonged intubation Followed by Pulmonology and PMNR 

## 2022-10-24 NOTE — Assessment & Plan Note (Signed)
Due to COPD Home O2 PRN Well-controlled with Pulmicort, Perforomist and PRN Albuterol F/u with Pulmonology 

## 2022-10-24 NOTE — Assessment & Plan Note (Signed)
Well-controlled On Pulmicort, Perforomist and PRN Albuterol F/u with Pulmonology 

## 2022-10-24 NOTE — Assessment & Plan Note (Signed)
Well-controlled with Seroquel, Wellbutrin, Buspar and Clonazepam Klonopin currently refilled by [facility] Will try to taper BuSpar, BID for now

## 2022-10-27 ENCOUNTER — Encounter: Payer: Medicaid Other | Attending: Physical Medicine & Rehabilitation | Admitting: Registered Nurse

## 2022-10-27 ENCOUNTER — Other Ambulatory Visit: Payer: Self-pay | Admitting: Physical Medicine & Rehabilitation

## 2022-10-27 ENCOUNTER — Encounter: Payer: Self-pay | Admitting: Registered Nurse

## 2022-10-27 VITALS — BP 103/76 | HR 93

## 2022-10-27 DIAGNOSIS — J398 Other specified diseases of upper respiratory tract: Secondary | ICD-10-CM | POA: Diagnosis present

## 2022-10-27 DIAGNOSIS — G8921 Chronic pain due to trauma: Secondary | ICD-10-CM | POA: Diagnosis present

## 2022-10-27 DIAGNOSIS — Z79899 Other long term (current) drug therapy: Secondary | ICD-10-CM | POA: Diagnosis present

## 2022-10-27 DIAGNOSIS — M546 Pain in thoracic spine: Secondary | ICD-10-CM | POA: Diagnosis present

## 2022-10-27 DIAGNOSIS — G894 Chronic pain syndrome: Secondary | ICD-10-CM | POA: Insufficient documentation

## 2022-10-27 DIAGNOSIS — G8929 Other chronic pain: Secondary | ICD-10-CM | POA: Diagnosis present

## 2022-10-27 DIAGNOSIS — S22008S Other fracture of unspecified thoracic vertebra, sequela: Secondary | ICD-10-CM | POA: Diagnosis present

## 2022-10-27 DIAGNOSIS — M545 Low back pain, unspecified: Secondary | ICD-10-CM | POA: Diagnosis present

## 2022-10-27 DIAGNOSIS — S22000G Wedge compression fracture of unspecified thoracic vertebra, subsequent encounter for fracture with delayed healing: Secondary | ICD-10-CM | POA: Insufficient documentation

## 2022-10-27 DIAGNOSIS — F418 Other specified anxiety disorders: Secondary | ICD-10-CM

## 2022-10-27 DIAGNOSIS — Z5181 Encounter for therapeutic drug level monitoring: Secondary | ICD-10-CM | POA: Insufficient documentation

## 2022-10-27 MED ORDER — OXYCODONE HCL 5 MG PO TABS: 5.0000 mg | ORAL_TABLET | Freq: Four times a day (QID) | ORAL | 0 refills | Status: DC | PRN

## 2022-10-27 MED ORDER — XTAMPZA ER 13.5 MG PO C12A
1.0000 | EXTENDED_RELEASE_CAPSULE | Freq: Two times a day (BID) | ORAL | 0 refills | Status: DC
Start: 2022-10-27 — End: 2022-10-27

## 2022-10-27 MED ORDER — XTAMPZA ER 13.5 MG PO C12A
1.0000 | EXTENDED_RELEASE_CAPSULE | Freq: Two times a day (BID) | ORAL | 0 refills | Status: DC
Start: 2022-10-27 — End: 2023-01-05

## 2022-10-27 MED ORDER — OXYCODONE HCL 5 MG PO TABS
5.0000 mg | ORAL_TABLET | Freq: Four times a day (QID) | ORAL | 0 refills | Status: DC | PRN
Start: 2022-10-27 — End: 2022-12-29

## 2022-10-27 NOTE — Progress Notes (Signed)
Subjective:    Patient ID: Charles Weeks, male    DOB: 11/10/1965, 57 y.o.   MRN: [id number]  HPI: Charles Weeks is a 57 y.o. male who returns for follow up appointment for chronic pain and medication refill. He states his pain is located in his mid- lower back pain. He rates his pain 8. His current exercise regime is walking and performing stretching exercises.  Mr. Makris Morphine equivalent is 75.00 MME. He is also prescribed Clonazepam  .We have discussed the black box warning of using opioids and benzodiazepines. I highlighted the dangers of using these drugs together and discussed the adverse events including respiratory suppression, overdose, cognitive impairment and importance of compliance with current regimen. We will continue to monitor and adjust as indicated.   Oral Swab was Performed today.    Pain Inventory Average Pain 8 Pain Right Now 8 My pain is burning and aching  In the last 24 hours, has pain interfered with the following? General activity 8 Relation with others 4 Enjoyment of life 9 What TIME of day is your pain at its worst? daytime Sleep (in general) fair  Pain is worse with: walking, bending, and standing Pain improves with: rest, heat/ice, and medication Relief from Meds: 6  Family History  Problem Relation Age of Onset   Heart disease Mother    Heart disease Father    Diabetes Sister    Social History   Socioeconomic History   Marital status: Divorced    Spouse name: Not on file   Number of children: 2   Years of education: Not on file   Highest education level: 7th grade  Occupational History   Not on file  Tobacco Use   Smoking status: Former    Packs/day: 1.5    Types: Cigarettes    Quit date: 08/17/2020    Years since quitting: 2.1   Smokeless tobacco: Never  Vaping Use   Vaping Use: Never used  Substance and Sexual Activity   Alcohol use: Not Currently   Drug use: Not Currently   Sexual activity: Not on file  Other Topics  Concern   Not on file  Social History Narrative   ** Merged History Encounter **       Lives with sister  Both daughters live close by   Sister has dog   Enjoys: shooting pool, fishing, Therapist, nutritional  Diet: eats all food groups Caffeine: pop-dr pepper 2 liter  Water: 4-6 cups   Wears seat belt  No driving for him Psychologist, sport and exercise  Fi   re Extinguisher No weapons        Social Determinants of Health   Financial Resource Strain: Low Risk  (10/23/2022)   Overall Financial Resource Strain (CARDIA)    Difficulty of Paying Living Expenses: Not very hard  Food Insecurity: No Food Insecurity (10/23/2022)   Hunger Vital Sign    Worried About Running Out of Food in the Last Year: Never true    Ran Out of Food in the Last Year: Never true  Transportation Needs: No Transportation Needs (10/23/2022)   PRAPARE - Administrator, Civil Service (Medical): No    Lack of Transportation (Non-Medical): No  Physical Activity: Unknown (10/23/2022)   Exercise Vital Sign    Days of Exercise per Week: 0 days    Minutes of Exercise per Session: Not on file  Stress: No Stress Concern Present (10/23/2022)   [employer] of Occupational Health - Occupational Stress Questionnaire  Feeling of Stress : Only a little  Social Connections: Socially Isolated (10/23/2022)   Social Connection and Isolation Panel [NHANES]    Frequency of Communication with Friends and Family: Once a week    Frequency of Social Gatherings with Friends and Family: More than three times a week    Attends Religious Services: Never    Database administrator or Organizations: No    Attends Engineer, structural: Not on file    Marital Status: Divorced   Past Surgical History:  Procedure Laterality Date   BALLOON DILATION N/A 02/06/2021   Procedure: BALLOON DILATION;  Surgeon: Christia Reading, MD;  Location: [facility] OR;  Service: ENT;  Laterality: N/A;   BOWEL RESECTION  2006   MICROLARYNGOSCOPY WITH LASER AND BALLOON  DILATION N/A 02/06/2021   Procedure: SUSPENDED MICRO DIRECT LARYNGOSCOPY;  Surgeon: Christia Reading, MD;  Location: [facility] OR;  Service: ENT;  Laterality: N/A;   PEG PLACEMENT N/A 09/03/2020   Procedure: PERCUTANEOUS ENDOSCOPIC GASTROSTOMY (PEG) PLACEMENT;  Surgeon: Violeta Gelinas, MD;  Location: [facility] OR;  Service: General;  Laterality: N/A;   PERCUTANEOUS TRACHEOSTOMY N/A 09/03/2020   Procedure: PERCUTANEOUS TRACHEOSTOMY USING 6 SHILEY;  Surgeon: Violeta Gelinas, MD;  Location: [facility] OR;  Service: General;  Laterality: N/A;   SKIN GRAFT     TONSILLECTOMY AND ADENOIDECTOMY     Past Surgical History:  Procedure Laterality Date   BALLOON DILATION N/A 02/06/2021   Procedure: BALLOON DILATION;  Surgeon: Christia Reading, MD;  Location: [facility] OR;  Service: ENT;  Laterality: N/A;   BOWEL RESECTION  2006   MICROLARYNGOSCOPY WITH LASER AND BALLOON DILATION N/A 02/06/2021   Procedure: SUSPENDED MICRO DIRECT LARYNGOSCOPY;  Surgeon: Christia Reading, MD;  Location: [facility] OR;  Service: ENT;  Laterality: N/A;   PEG PLACEMENT N/A 09/03/2020   Procedure: PERCUTANEOUS ENDOSCOPIC GASTROSTOMY (PEG) PLACEMENT;  Surgeon: Violeta Gelinas, MD;  Location: [facility] OR;  Service: General;  Laterality: N/A;   PERCUTANEOUS TRACHEOSTOMY N/A 09/03/2020   Procedure: PERCUTANEOUS TRACHEOSTOMY USING 6 SHILEY;  Surgeon: Violeta Gelinas, MD;  Location: [facility] OR;  Service: General;  Laterality: N/A;   SKIN GRAFT     TONSILLECTOMY AND ADENOIDECTOMY     Past Medical History:  Diagnosis Date   Anxiety 01/19/2020   Arthritis    Asthma    Benign prostatic hyperplasia with weak urinary stream 12/08/2019   BPH (benign prostatic hyperplasia) 08/17/2020   Chronic respiratory failure with hypoxia (HCC) 01/24/2020   Has home 02 but not using as of 01/24/2020  -  01/24/2020   Walked RA  approx   200 ft  @ slow pace  stopped due to  Dizzy with sats still 95%      Cigarette smoker 01/24/2020   Stopped regular smoking 10/2019 but still "now and then" as of 01/24/2020    COPD  (chronic obstructive pulmonary disease) (HCC) 08/17/2020   gold   COPD GOLD ?     Quit smoking 10/2019  - Labs ordered 01/24/2020  :  allergy profile   alpha one AT phenotype   - 01/24/2020  After extensive coaching inhaler device,  effectiveness =    75% (short Ti) > change symb 80 to 160 2bid      Coronary artery disease 06/08/2018   DES to RCA, [facility] [place])   Depression, major, single episode, moderate (HCC) 01/19/2020   Dyspnea    Encounter for screening for malignant neoplasm of colon 12/08/2019   Essential hypertension 12/08/2019   History  of MI (myocardial infarction) 01/19/2020   HTN (hypertension) 08/17/2020   Myocardial infarct (HCC) 2019   Pneumonia    Positive colorectal cancer screening using Cologuard test    There were no vitals taken for this visit.  Opioid Risk Score:   Fall Risk Score:  `1  Depression screen [facility] 2/9     10/23/2022    1:07 PM 08/27/2022   11:14 AM 06/24/2022    1:02 PM 04/16/2022   10:55 AM 01/07/2022   10:13 AM 12/25/2021   12:52 PM 12/04/2021    9:56 AM  Depression screen PHQ 2/9  Decreased Interest 1 1 1  0 0 0 0  Down, Depressed, Hopeless 1 1 1  0 3 0 3  PHQ - 2 Score 2 2 2  0 3 0 3  Altered sleeping 0    0  0  Tired, decreased energy 1    0  3  Change in appetite 0    1  0  Feeling bad or failure about yourself  0    1  0  Trouble concentrating 0    0  3  Moving slowly or fidgety/restless 0    0  0  Suicidal thoughts 0    0  0  PHQ-9 Score 3    5  9   Difficult doing work/chores Not difficult at all    Somewhat difficult        Review of Systems  Musculoskeletal:  Positive for back pain.  All other systems reviewed and are negative.      Objective:   Physical Exam Vitals and nursing note reviewed.  Constitutional:      Appearance: Normal appearance.  Cardiovascular:     Rate and Rhythm: Normal rate and regular rhythm.     Pulses: Normal pulses.     Heart sounds: Normal heart sounds.  Pulmonary:     Effort:  Pulmonary effort is normal.     Breath sounds: Normal breath sounds.     Comments: Continuous 2 Liters Trach Collar Musculoskeletal:     Cervical back: Normal range of motion and neck supple.     Comments: Normal Muscle Bulk and Muscle Testing Reveals:  Upper Extremities: Full ROM and Muscle Strength 5/5 Thoracic and Lumbar Hypersensitivity Lower Extremities: Full ROM and Muscle Strength 5/5 Arises from Table slowly Narrow Based Gait     Skin:    General: Skin is warm and dry.  Neurological:     Mental Status: He is alert and oriented to person, place, and time.  Psychiatric:        Mood and Affect: Mood normal.        Behavior: Behavior normal.         Assessment & Plan:  1.Thoracic Spine Fracture:  Continue HEP as Tolerated. Continue to Monitor. 10/27/2022 2.Subglottic Stenosis/Tracheostomy: Stephannie Peters with 2 liters of Oxygen: Dr. Marene Lenz Following. Continue to Monitor. 10/27/2022 3. Urinary Retention due to BPH: Continue current medication regimen. PCP following. 10/27/2022 4. Chronic  Pain:Syndrome Refilled: Xtampza 13.5 mg every 12 hours# 60 and  and Oxycodone 5 mg 4 times a day as needed for pain #120. Second script sent for the following month.  We will continue the opioid monitoring program, this consists of regular clinic visits, examinations, urine drug screen, pill counts as well as use of [place] Controlled Substance Reporting system. A 12 month History has been reviewed on the [place] Controlled Substance Reporting System on 10/27/2022 5. Slow Transit Constipation: Continue with  Bowel Regimen. Continue to Monitor. 10/27/2022   F/U in 2 months

## 2022-10-31 LAB — DRUG TOX ALC METAB W/CON, ORAL FLD: Alcohol Metabolite: NEGATIVE ng/mL (ref <25)

## 2022-10-31 LAB — DRUG TOX MONITOR 1 W/CONF, ORAL FLD

## 2022-11-03 ENCOUNTER — Encounter: Payer: Self-pay | Admitting: Nurse Practitioner

## 2022-11-03 ENCOUNTER — Ambulatory Visit: Payer: Medicaid Other | Attending: Cardiology | Admitting: Nurse Practitioner

## 2022-11-03 VITALS — BP 115/72 | HR 103 | Ht 68.0 in | Wt 193.0 lb

## 2022-11-03 DIAGNOSIS — I2510 Atherosclerotic heart disease of native coronary artery without angina pectoris: Secondary | ICD-10-CM | POA: Diagnosis present

## 2022-11-03 DIAGNOSIS — J449 Chronic obstructive pulmonary disease, unspecified: Secondary | ICD-10-CM | POA: Diagnosis present

## 2022-11-03 DIAGNOSIS — E782 Mixed hyperlipidemia: Secondary | ICD-10-CM | POA: Diagnosis present

## 2022-11-03 MED ORDER — NITROGLYCERIN 0.4 MG SL SUBL: 0.4000 mg | SUBLINGUAL_TABLET | SUBLINGUAL | 2 refills | Status: DC | PRN

## 2022-11-03 NOTE — Progress Notes (Signed)
Office Visit    Patient Name: Charles Weeks Date of Encounter: 11/03/2022  PCP:  Anabel Halon, MD   [facility]  Cardiologist:  Nona Dell, MD  Advanced Practice Provider:  No care team member to display Electrophysiologist:  None   Chief Complaint    Charles Weeks is a 57 y.o. male with a hx of CAD, mixed HLD, COPD with tracheostomy (has had tracheostomy for 2 years), HTN, who presents today for 6 month follow-up.    Past Medical History    Past Medical History:  Diagnosis Date   Anxiety 01/19/2020   Arthritis    Asthma    Benign prostatic hyperplasia with weak urinary stream 12/08/2019   BPH (benign prostatic hyperplasia) 08/17/2020   Chronic respiratory failure with hypoxia (HCC) 01/24/2020   Has home 02 but not using as of 01/24/2020  -  01/24/2020   Walked RA  approx   200 ft  @ slow pace  stopped due to  Dizzy with sats still 95%      Cigarette smoker 01/24/2020   Stopped regular smoking 10/2019 but still "now and then" as of 01/24/2020    COPD (chronic obstructive pulmonary disease) (HCC) 08/17/2020   gold   COPD GOLD ?     Quit smoking 10/2019  - Labs ordered 01/24/2020  :  allergy profile   alpha one AT phenotype   - 01/24/2020  After extensive coaching inhaler device,  effectiveness =    75% (short Ti) > change symb 80 to 160 2bid      Coronary artery disease 06/08/2018   DES to RCA, [facility] [place], [place])   Depression, major, single episode, moderate (HCC) 01/19/2020   Dyspnea    Encounter for screening for malignant neoplasm of colon 12/08/2019   Essential hypertension 12/08/2019   History of MI (myocardial infarction) 01/19/2020   HTN (hypertension) 08/17/2020   Myocardial infarct (HCC) 2019   Pneumonia    Positive colorectal cancer screening using Cologuard test    Past Surgical History:  Procedure Laterality Date   BALLOON DILATION N/A 02/06/2021   Procedure: BALLOON DILATION;  Surgeon: Christia Reading, MD;  Location:  [facility] OR;  Service: ENT;  Laterality: N/A;   BOWEL RESECTION  2006   MICROLARYNGOSCOPY WITH LASER AND BALLOON DILATION N/A 02/06/2021   Procedure: SUSPENDED MICRO DIRECT LARYNGOSCOPY;  Surgeon: Christia Reading, MD;  Location: [facility] OR;  Service: ENT;  Laterality: N/A;   PEG PLACEMENT N/A 09/03/2020   Procedure: PERCUTANEOUS ENDOSCOPIC GASTROSTOMY (PEG) PLACEMENT;  Surgeon: Violeta Gelinas, MD;  Location: [facility] OR;  Service: General;  Laterality: N/A;   PERCUTANEOUS TRACHEOSTOMY N/A 09/03/2020   Procedure: PERCUTANEOUS TRACHEOSTOMY USING 6 SHILEY;  Surgeon: Violeta Gelinas, MD;  Location: [facility] OR;  Service: General;  Laterality: N/A;   SKIN GRAFT     TONSILLECTOMY AND ADENOIDECTOMY      Allergies  Allergies  Allergen Reactions   Ciprofloxacin Hives    History of Present Illness    Charles Weeks is a very pleasant 57 y.o. male with a PMH as mentioned above.  Previous history of stent placed to the RCA in 2020.  Myoview in September 2022 was low risk.  Last seen by Dr. Diona Browner on March 05, 2022.  He was doing well at the time.  Was anticipating colonoscopy in early November 2023 for evaluation of heme positive stools.  Today he presents for 65-month follow-up with his sister who is his medical POA and caregiver.  He states he is doing well. Sister admits to recent weight loss, pt has lost around 10 lbs per his report. Talking better with trach. Denies any acute cardiac complaints or issues. Sister has pulse Ox for pt at home to monitor his VS. Denies any chest pain,  sustained palpitations, syncope, presyncope, dizziness, orthopnea, PND, swelling or significant weight changes, acute bleeding, or claudication. Breathing is stable.   EKGs/Labs/Other Studies Reviewed:   The following studies were reviewed today:   EKG:  EKG is not ordered today.   Myoview 01/2021:    Findings are consistent with prior inferior myocardial infarction. There is no current ischemia. The study is low risk.   No ST  deviation was noted.   Left ventricular function is normal. Nuclear stress EF: 50 %. The left ventricular ejection fraction is low normal). End diastolic cavity size is normal.   Echo 01/2021:   1. Left ventricular ejection fraction, by estimation, is 50 to 55%. The  left ventricle has low normal function. The left ventricle has no regional  wall motion abnormalities. There is mild left ventricular hypertrophy.  Left ventricular diastolic  parameters are consistent with Grade I diastolic dysfunction (impaired  relaxation).   2. Right ventricular systolic function is normal. The right ventricular  size is normal.   3. The mitral valve is normal in structure. No evidence of mitral valve  regurgitation. No evidence of mitral stenosis.   4. The aortic valve is tricuspid. Aortic valve regurgitation is not  visualized. No aortic stenosis is present.   5. IVC is small suggesting low RA pressure and hypovolemia.  Recent Labs: 11/28/2021: Magnesium 1.9 10/23/2022: ALT 20; BUN 8; Creatinine, Ser 0.91; Hemoglobin 14.0; Platelets 214; Potassium 4.2; Sodium 143; TSH 2.590  Recent Lipid Panel    Component Value Date/Time   CHOL 137 03/05/2022 1215   TRIG 298 (H) 03/05/2022 1215   HDL 42 03/05/2022 1215   CHOLHDL 3.3 03/05/2022 1215   VLDL 60 (H) 03/05/2022 1215   LDLCALC 35 03/05/2022 1215    Home Medications   Current Meds  Medication Sig   acetaminophen (TYLENOL) 325 MG tablet Take 2 tablets (650 mg total) by mouth every 6 (six) hours as needed for mild pain or moderate pain.   albuterol (VENTOLIN HFA) 108 (90 Base) MCG/ACT inhaler INHALE 1 OR 2 PUFFS INTO THE LUNGS EVERY 4 HOURS AS NEEDED FOR WHEEZING OR SHORTNESS OF BREATH   aspirin 81 MG chewable tablet Chew 1 tablet (81 mg total) by mouth daily.   bethanechol (URECHOLINE) 25 MG tablet TAKE 2 TABLETS BY MOUTH (4) TIMES DAILY   budesonide (PULMICORT) 0.25 MG/2ML nebulizer solution 2.5 mg  twice daily with performist   buPROPion (WELLBUTRIN  XL) 300 MG 24 hr tablet Take 1 tablet (300 mg total) by mouth daily.   busPIRone (BUSPAR) 10 MG tablet Take 1 tablet (10 mg total) by mouth 2 (two) times daily.   Calcium Carb-Cholecalciferol (CALCIUM 600/VITAMIN D) 600-400 MG-UNIT CHEW Chew 1 each by mouth daily.   Cholecalciferol (VITAMIN D3) 125 MCG (5000 UT) CAPS Take 5,000 Units by mouth daily.   clonazePAM (KLONOPIN) 0.5 MG tablet TAKE 1 TABLET BY MOUTH AT BEDTIME.   Docusate Sodium (STOOL SOFTENER LAXATIVE PO) Take 200 mg by mouth daily. 2-4 tablets   folic acid (FOLVITE) 1 MG tablet TAKE ONE TABLET BY MOUTH ONCE DAILY.   formoterol (PERFOROMIST) 20 MCG/2ML nebulizer solution Take 2 mLs (20 mcg total) by nebulization 2 (two) times daily. Use in  nebulizer twice daily perfectly regularly   Misc. Devices (TRANSPORT CHAIR) MISC For transporting to and from Doctors appointments   montelukast (SINGULAIR) 10 MG tablet Take 1 tablet (10 mg total) by mouth at bedtime.   ondansetron (ZOFRAN) 4 MG tablet TAKE (1) TABLET BY MOUTH EVERY EIGHT HOURS AS NEEDED.   oxyCODONE (OXY IR/ROXICODONE) 5 MG immediate release tablet Take 1 tablet (5 mg total) by mouth 4 (four) times daily as needed for moderate pain.   oxyCODONE ER (XTAMPZA ER) 13.5 MG C12A Take 1 capsule by mouth every 12 (twelve) hours.   OXYGEN Inhale 3 L into the lungs continuous.   pantoprazole (PROTONIX) 40 MG tablet Take 1 tablet (40 mg total) by mouth daily.   polyethylene glycol powder (GLYCOLAX/MIRALAX) 17 GM/SCOOP powder Take 255 g by mouth daily.   QUEtiapine (SEROQUEL) 300 MG tablet TAKE (1/2) A TABLET BY MOUTH TWICE A DAY, AND (1) TABLET AT BEDTIME.   rosuvastatin (CRESTOR) 5 MG tablet TAKE ONE TABLET BY MOUTH ONCE DAILY   tamsulosin (FLOMAX) 0.4 MG CAPS capsule Take 1 capsule (0.4 mg total) by mouth daily.   [DISCONTINUED] nitroGLYCERIN (NITROSTAT) 0.4 MG SL tablet Place 1 tablet (0.4 mg total) under the tongue every 5 (five) minutes as needed for chest pain.     Review of Systems     All other systems reviewed and are otherwise negative except as noted above.  Physical Exam    VS:  BP 115/72   Pulse (!) 103   Ht 5\' 8"  (1.727 m)   Wt 193 lb (87.5 kg)   SpO2 95%   BMI 29.35 kg/m  , BMI Body mass index is 29.35 kg/m.  Wt Readings from Last 3 Encounters:  11/03/22 193 lb (87.5 kg)  09/09/22 197 lb (89.4 kg)  08/27/22 196 lb (88.9 kg)     GEN: Well nourished, well developed, in no acute distress. HEENT: normal. Neck: Supple, no JVD, tracheostomy intact (WNL), no carotid bruits, no masses. Cardiac: S1/S2, RRR, no murmurs, rubs, or gallops. No clubbing, cyanosis, edema.  Radials/PT 2+ and equal bilaterally.  Respiratory:  Respirations regular and unlabored, clear to auscultation bilaterally. GI: Soft, nontender, nondistended. MS: No deformity or atrophy. Skin: Warm and dry, no rash. Neuro:  Strength and sensation are intact. Psych: Normal affect.  Assessment & Plan    CAD Hx of DES to RCA in 2020. Stable with no anginal symptoms. No indication for ischemic evaluation. Continue ASA, rosuvastatin, and will refill NTG. Heart healthy diet encouraged.   2. Mixed HLD Most recent labs look good. No medication changes at this time. Heart healthy diet encouraged.   3. COPD Doing well. Talking better. Continue to follow-up with Dr. Sherene Sires.   Disposition: Follow up in 6 month(s) with Nona Dell, MD or APP.  Signed, Sharlene Dory, NP 11/03/2022, 3:32 PM  [facility]

## 2022-11-03 NOTE — Patient Instructions (Addendum)
Medication Instructions:  Your physician recommends that you continue on your current medications as directed. Please refer to the Current Medication list given to you today.  Labwork: none  Testing/Procedures: none  Follow-Up: Your physician recommends that you schedule a follow-up appointment in: 6 months with Dr. McDowell  Any Other Special Instructions Will Be Listed Below (If Applicable).  If you need a refill on your cardiac medications before your next appointment, please call your pharmacy. 

## 2022-11-04 ENCOUNTER — Telehealth: Payer: Self-pay | Admitting: Registered Nurse

## 2022-11-04 ENCOUNTER — Encounter: Payer: Self-pay | Admitting: Physical Medicine & Rehabilitation

## 2022-11-04 ENCOUNTER — Telehealth: Payer: Self-pay | Admitting: *Deleted

## 2022-11-04 NOTE — Telephone Encounter (Signed)
Prior auth submitted to [employer] for Xtampza.

## 2022-11-04 NOTE — Telephone Encounter (Signed)
She called and said that the pharmacy is needing a authorization sent to [facility] for the xtampza

## 2022-11-04 NOTE — Telephone Encounter (Signed)
submitted

## 2022-11-05 NOTE — Telephone Encounter (Signed)
Approved.  

## 2022-11-10 ENCOUNTER — Other Ambulatory Visit: Payer: Self-pay | Admitting: Internal Medicine

## 2022-11-10 ENCOUNTER — Encounter: Payer: Self-pay | Admitting: Internal Medicine

## 2022-11-10 ENCOUNTER — Other Ambulatory Visit: Payer: Self-pay

## 2022-11-10 MED ORDER — BETHANECHOL CHLORIDE 25 MG PO TABS: ORAL_TABLET | ORAL | 0 refills | Status: DC

## 2022-11-21 ENCOUNTER — Encounter: Payer: Self-pay | Admitting: Internal Medicine

## 2022-11-21 ENCOUNTER — Other Ambulatory Visit: Payer: Self-pay | Admitting: Internal Medicine

## 2022-11-21 DIAGNOSIS — J449 Chronic obstructive pulmonary disease, unspecified: Secondary | ICD-10-CM

## 2022-11-21 NOTE — Telephone Encounter (Signed)
Refill sent.

## 2022-12-02 ENCOUNTER — Telehealth: Payer: Self-pay | Admitting: Internal Medicine

## 2022-12-02 NOTE — Telephone Encounter (Signed)
[name] sister called (POA) asking if patient scan switch providers.  saying Dr Allena Katz is family practice and Dr Durwin Nora is internal medicine and Charles Weeks has a lot of complicated medical problems asking can he switch to see Dr Durwin Nora.  [name] call back # [phone]

## 2022-12-04 ENCOUNTER — Encounter: Payer: Self-pay | Admitting: Internal Medicine

## 2022-12-04 NOTE — Telephone Encounter (Signed)
scheduled

## 2022-12-08 ENCOUNTER — Other Ambulatory Visit: Payer: Self-pay | Admitting: Internal Medicine

## 2022-12-09 ENCOUNTER — Other Ambulatory Visit (INDEPENDENT_AMBULATORY_CARE_PROVIDER_SITE_OTHER): Payer: Self-pay | Admitting: Gastroenterology

## 2022-12-09 ENCOUNTER — Encounter (INDEPENDENT_AMBULATORY_CARE_PROVIDER_SITE_OTHER): Payer: Self-pay | Admitting: Gastroenterology

## 2022-12-09 DIAGNOSIS — R140 Abdominal distension (gaseous): Secondary | ICD-10-CM

## 2022-12-11 ENCOUNTER — Other Ambulatory Visit: Payer: Self-pay | Admitting: Internal Medicine

## 2022-12-15 ENCOUNTER — Telehealth: Payer: Self-pay | Admitting: Cardiology

## 2022-12-15 ENCOUNTER — Other Ambulatory Visit: Payer: Self-pay

## 2022-12-15 MED ORDER — METOPROLOL SUCCINATE ER 25 MG PO TB24: 12.5000 mg | ORAL_TABLET | Freq: Every day | ORAL | 3 refills | Status: DC

## 2022-12-15 NOTE — Telephone Encounter (Signed)
STAT if HR is under 50 or over 120 (normal HR is 60-100 beats per minute)  What is your heart rate? 117  Do you have a log of your heart rate readings (document readings)? 108-128  Do you have any other symptoms? Patient's sister was told to call if patient's resting HR stayed consistently high.

## 2022-12-22 ENCOUNTER — Ambulatory Visit (HOSPITAL_COMMUNITY)
Admission: RE | Admit: 2022-12-22 | Discharge: 2022-12-22 | Disposition: A | Discharge status: Home / Self Care | Payer: Medicaid Other | Source: Ambulatory Visit | Attending: Gastroenterology | Admitting: Gastroenterology

## 2022-12-22 ENCOUNTER — Telehealth: Payer: Self-pay | Admitting: Internal Medicine

## 2022-12-22 DIAGNOSIS — R140 Abdominal distension (gaseous): Secondary | ICD-10-CM | POA: Diagnosis not present

## 2022-12-22 NOTE — Telephone Encounter (Signed)
Patient sister calling says patient is running a fever along with abdominal pain, N&V, coughing up green phlegm. Requesting an order for chest xray and wishing to speak with a nurse asap to discuss what to do. Please advise Thank you

## 2022-12-22 NOTE — Telephone Encounter (Signed)
The patient switched from Dr Allena Katz to Durwin Nora and Durwin Nora has not yet seen the pt so he will need an appt with [facility] any testing or meds can be ordered. Pls advise

## 2022-12-23 ENCOUNTER — Encounter: Payer: Self-pay | Admitting: Internal Medicine

## 2022-12-23 ENCOUNTER — Telehealth (INDEPENDENT_AMBULATORY_CARE_PROVIDER_SITE_OTHER): Payer: Self-pay

## 2022-12-23 ENCOUNTER — Telehealth: Payer: Self-pay | Admitting: *Deleted

## 2022-12-23 DIAGNOSIS — J449 Chronic obstructive pulmonary disease, unspecified: Secondary | ICD-10-CM

## 2022-12-23 NOTE — Progress Notes (Unsigned)
Referring Provider: Billie Lade, MD Primary Care Physician:  Billie Lade, MD Primary GI Physician: Dr. Levon Hedger   Chief Complaint  Patient presents with   Abdominal Pain    Nausea and vomiting. Some diarrhea and constipation. Upper stomach pains     HPI:   Charles Weeks is a 57 y.o. 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 He is presenting today for urgent visit for evaluation of nausea and vomiting.   Last seen in the office 07/10/22. He was taking Miralax 1.5 capfuls per day, but sometimes two times a day.  Also takes standing Senokot and docusate. His bowels were moving almost every day. Had 2 occurrences where he has to strain. Some occasional nausea for which he was using Zofran. Denied vomiting or abdominal pain. He was having a lot of belching in the setting of multiple soda daily. He was to continue his current bowel regimen, titrating MiraLAX to prevent straining/constipation and advised to avoid soda.   Patient's caregiver called 8/6 reporting patient had been nauseated and vomiting starting two days ago it and ended yesterday but also with low grade fever of 100.3. Reported having abdominal x ray on 8/5. Also reported starting Reglan 8/4 as Dr. Levon Hedger has recommended a few weeks ago. Dr. Salena Saner reviewed x-ray images and didn't see any clear obstruction, but final read was not available. Recommended scheduling urgent OV, but if vomiting or fever persists, patient should proceed to the ER.    Today:  3-4 weeks ago started getting nauseated. Was salivating but no vomiting. Was taking Zofran to help with the nausea. No abdominal pain at that time, but  was having significant amount of belching and gas and was constipated. His bowels finally started to move and stools were loose like diarrhea.  After this, he felt better.  Then, his wife got sick with nausea but was also vomiting.  His caretaker had also been out with nausea and vomiting.  Then, this past weekend, he started having nausea and vomiting along with epigastric abdominal pain. Was feeling short of breath. Took trach out, cleaned out. Corrected oxygen machine and breathing improved.  He did develop low-grade fever up to 100.3.  He last vomited Monday night.  Had some nausea yesterday, but took his sister's Reglan, which Dr. Levon Hedger had given the okay for.  He has not had any nausea today and has not taken Reglan or Zofran.  His abdominal pain is also improving, but is still present in the epigastric area.  No worsening with meals.  States he was very bloated when his symptoms first started, but this is improving as well.  His bowels are moving.  Had 2 soft bowel movements yesterday and 1 soft bowel movement this morning though his bowel movement this morning was not very productive.  He is currently taking 2 capfuls of MiraLAX in the morning and 1 Senokot in the evening.  He is not taking docusate.   Denies heartburn symptoms.  He is taking pantoprazole once daily.  No NSAIDs aside from 81 mg aspirin.     Prior endoscopic evaluation:  No prior EGD.   Colonoscopy at [facility] 03/20/2022 by Dr.Jawitz with the following findings: - The examined portion  of the ileum was normal. - Four 4 to 5 mm polyps in the descending colon, in  the transverse colon and in the ascending colon,  removed with a cold snare. Resected and retrieved. - Erythematous mucosa in the rectum and at the splenic  flexure, and area of relative luminal stenosis at  approximately 30cm proximal to the anus. Overall query  ischemic vs stercoral injury. Biopsied. - Patent end-to-end colo-colonic anastomosis at 20cm  from  the anus, characterized by healthy appearing  mucosa. - Internal hemorrhoids.    Path: A. Colon polyps, endoscopic polypectomy: Tubular adenoma, multiple fragments. Benign inflammatory polyp, one fragment.  B. Splenic flexure, endoscopic biopsy: Colonic mucosa with increased lamina propria chronic inflammatory infiltrates (including eosinophils), surface epithelial degenerative change and focal mild crypt distortion. See comment.  C. Colon, sigmoid, endoscopic biopsy: Colonic mucosa with mild crypt distortion and increased lamina propria eosinophils. See comment.  B. Rectum, endoscopic biopsy: Colonic mucosa with increased lamina propria inflammation, cryptitis and surface epithelial degenerative changes. See comment.  Comment: The differential diagnosis includes drug-induced injury, stercoral injury and less likely infectious etiologies. In addition, ischemic injury cannot be ruled out due to atrophic appearance of surface epithelium, however other typical ischemic histologic changes are not seen in these biopsies. Basal plasmacytosis is not seen. Inflammatory bowel disease is not favored.   Given severe comorbidities patient, he was recommended to not have any more colonoscopies in the future unless having clinical symptoms.  Today:    Past Medical History:  Diagnosis Date   Anxiety 01/19/2020   Arthritis    Asthma    Benign prostatic hyperplasia with weak urinary stream 12/08/2019   BPH (benign prostatic hyperplasia) 08/17/2020   Chronic respiratory failure with hypoxia (HCC) 01/24/2020   Has home 02 but not using as of 01/24/2020  -  01/24/2020   Walked RA  approx   200 ft  @ slow pace  stopped due to  Dizzy with sats still 95%      Cigarette smoker 01/24/2020   Stopped regular smoking 10/2019 but still "now and then" as of 01/24/2020    COPD (chronic obstructive pulmonary disease) (HCC) 08/17/2020   gold   COPD GOLD ?     Quit smoking 10/2019  - Labs ordered 01/24/2020  :   allergy profile   alpha one AT phenotype   - 01/24/2020  After extensive coaching inhaler device,  effectiveness =    75% (short Ti) > change symb 80 to 160 2bid      Coronary artery disease 06/08/2018   DES to RCA, [facility] [place])   Depression, major, single episode, moderate (HCC) 01/19/2020   Dyspnea    Encounter for screening for malignant neoplasm of colon 12/08/2019   Essential hypertension 12/08/2019   History of MI (myocardial infarction) 01/19/2020   HTN (hypertension) 08/17/2020   Myocardial infarct (HCC) 2019   Pneumonia    Positive colorectal cancer screening using Cologuard test     Past Surgical History:  Procedure Laterality Date   BALLOON DILATION N/A 02/06/2021   Procedure: BALLOON DILATION;  Surgeon: Christia Reading, MD;  Location: [facility] OR;  Service: ENT;  Laterality: N/A;   BOWEL RESECTION  2006   COLONOSCOPY  03/2022   [facility]   MICROLARYNGOSCOPY WITH LASER AND BALLOON DILATION N/A 02/06/2021   Procedure: SUSPENDED MICRO DIRECT LARYNGOSCOPY;  Surgeon: Christia Reading, MD;  Location: [facility] OR;  Service: ENT;  Laterality: N/A;   PEG PLACEMENT N/A 09/03/2020   Procedure:  PERCUTANEOUS ENDOSCOPIC GASTROSTOMY (PEG) PLACEMENT;  Surgeon: Violeta Gelinas, MD;  Location: [facility] OR;  Service: General;  Laterality: N/A;   PERCUTANEOUS TRACHEOSTOMY N/A 09/03/2020   Procedure: PERCUTANEOUS TRACHEOSTOMY USING 6 SHILEY;  Surgeon: Violeta Gelinas, MD;  Location: [facility] OR;  Service: General;  Laterality: N/A;   SKIN GRAFT     TONSILLECTOMY AND ADENOIDECTOMY      Current Outpatient Medications  Medication Sig Dispense Refill   acetaminophen (TYLENOL) 325 MG tablet Take 2 tablets (650 mg total) by mouth every 6 (six) hours as needed for mild pain or moderate pain.     albuterol (VENTOLIN HFA) 108 (90 Base) MCG/ACT inhaler INHALE 1 OR 2 PUFFS INTO THE LUNGS EVERY 4 HOURS AS NEEDED FOR WHEEZING OR SHORTNESS OF BREATH 18 g 0   aspirin 81 MG chewable tablet Chew 1 tablet (81 mg total)  by mouth daily.     bethanechol (URECHOLINE) 25 MG tablet TAKE 2 TABLETS BY MOUTH (4) TIMES DAILY 240 tablet 0   budesonide (PULMICORT) 0.25 MG/2ML nebulizer solution Generic for Pulmicort. Inhale 1 vial via nebulizer twice daily with Perforomist. 60 mL 11   buPROPion (WELLBUTRIN XL) 300 MG 24 hr tablet Take 1 tablet (300 mg total) by mouth daily. 30 tablet 5   busPIRone (BUSPAR) 10 MG tablet Take 1 tablet (10 mg total) by mouth 2 (two) times daily. 60 tablet 5   Calcium Carb-Cholecalciferol (CALCIUM 600/VITAMIN D) 600-400 MG-UNIT CHEW Chew 1 each by mouth daily.     Cholecalciferol (VITAMIN D3) 125 MCG (5000 UT) CAPS Take 5,000 Units by mouth daily.     clonazePAM (KLONOPIN) 0.5 MG tablet TAKE 1 TABLET BY MOUTH AT BEDTIME. 30 tablet 2   folic acid (FOLVITE) 1 MG tablet TAKE ONE TABLET BY MOUTH ONCE DAILY. 90 tablet 3   formoterol (PERFOROMIST) 20 MCG/2ML nebulizer solution Take 2 mLs (20 mcg total) by nebulization 2 (two) times daily. Use in nebulizer twice daily perfectly regularly 120 mL 11   metoCLOPramide (REGLAN) 5 MG tablet Take 1 tablet (5 mg total) by mouth every 6 (six) hours as needed for nausea. 10 tablet 0   metoprolol succinate (TOPROL XL) 25 MG 24 hr tablet Take 0.5 tablets (12.5 mg total) by mouth daily. 45 tablet 3   Misc. Devices ([employer]) MISC For transporting to and from Doctors appointments 1 each 0   montelukast (SINGULAIR) 10 MG tablet Take 1 tablet (10 mg total) by mouth at bedtime. 90 tablet 3   nitroGLYCERIN (NITROSTAT) 0.4 MG SL tablet Place 1 tablet (0.4 mg total) under the tongue every 5 (five) minutes as needed for chest pain. 25 tablet 2   ondansetron (ZOFRAN) 4 MG tablet TAKE (1) TABLET BY MOUTH EVERY EIGHT HOURS AS NEEDED. 30 tablet 1   oxyCODONE (OXY IR/ROXICODONE) 5 MG immediate release tablet Take 1 tablet (5 mg total) by mouth 4 (four) times daily as needed for moderate pain. 120 tablet 0   oxyCODONE ER (XTAMPZA ER) 13.5 MG C12A Take 1 capsule by mouth  every 12 (twelve) hours. 60 capsule 0   OXYGEN Inhale 3 L into the lungs continuous.     pantoprazole (PROTONIX) 40 MG tablet Take 1 tablet (40 mg total) by mouth daily. 90 tablet 1   polyethylene glycol powder (GLYCOLAX/MIRALAX) 17 GM/SCOOP powder Take 255 g by mouth daily. (Patient taking differently: Take 34 g by mouth daily.) 850 g 5   QUEtiapine (SEROQUEL) 300 MG tablet TAKE (1/2) A TABLET BY MOUTH TWICE A DAY,  AND (1) TABLET AT BEDTIME. 60 tablet 5   rosuvastatin (CRESTOR) 5 MG tablet TAKE ONE TABLET BY MOUTH ONCE DAILY 90 tablet 3   senna (SENOKOT) 8.6 MG tablet Take 1 tablet by mouth at bedtime.     tamsulosin (FLOMAX) 0.4 MG CAPS capsule Take 1 capsule (0.4 mg total) by mouth daily. 60 capsule 6   Docusate Sodium (STOOL SOFTENER LAXATIVE PO) Take 200 mg by mouth daily. 2-4 tablets (Patient not taking: Reported on 12/24/2022)     No current facility-administered medications for this visit.    Allergies as of 12/24/2022 - Review Complete 12/24/2022  Allergen Reaction Noted   Ciprofloxacin Hives 11/23/2021    Family History  Problem Relation Age of Onset   Heart disease Mother    Heart disease Father    Diabetes Sister     Social History   Socioeconomic History   Marital status: Divorced    Spouse name: Not on file   Number of children: 2   Years of education: Not on file   Highest education level: 7th grade  Occupational History   Not on file  Tobacco Use   Smoking status: Former    Current packs/day: 0.00    Types: Cigarettes    Quit date: 08/17/2020    Years since quitting: 2.3   Smokeless tobacco: Never  Vaping Use   Vaping status: Never Used  Substance and Sexual Activity   Alcohol use: Not Currently   Drug use: Not Currently   Sexual activity: Not on file  Other Topics Concern   Not on file  Social History Narrative   ** Merged History Encounter **       Lives with sister  Both daughters live close by   Sister has dog   Enjoys: shooting pool, fishing,  Therapist, nutritional  Diet: eats all food groups Caffeine: pop-dr pepper 2 liter  Water: 4-6 cups   Wears seat belt  No driving for him Psychologist, sport and exercise  Fi   re Extinguisher No weapons        Social Determinants of Health   Financial Resource Strain: Low Risk  (10/23/2022)   Overall Financial Resource Strain (CARDIA)    Difficulty of Paying Living Expenses: Not very hard  Food Insecurity: No Food Insecurity (10/23/2022)   Hunger Vital Sign    Worried About Running Out of Food in the Last Year: Never true    Ran Out of Food in the Last Year: Never true  Transportation Needs: No Transportation Needs (10/23/2022)   PRAPARE - Administrator, Civil Service (Medical): No    Lack of Transportation (Non-Medical): No  Physical Activity: Unknown (10/23/2022)   Exercise Vital Sign    Days of Exercise per Week: 0 days    Minutes of Exercise per Session: Not on file  Stress: No Stress Concern Present (10/23/2022)   [employer] of Occupational Health - Occupational Stress Questionnaire    Feeling of Stress : Only a little  Social Connections: Socially Isolated (10/23/2022)   Social Connection and Isolation Panel [NHANES]    Frequency of Communication with Friends and Family: Once a week    Frequency of Social Gatherings with Friends and Family: More than three times a week    Attends Religious Services: Never    Database administrator or Organizations: No    Attends Engineer, structural: Not on file    Marital Status: Divorced    Review of Systems: Gen: Denies fever, chills, cold  or flulike symptoms, presyncope, syncope. CV: Denies chest pain, palpitations. Resp: Denies dyspnea at rest.  Admits to cough. GI: See HPI Heme: See HPI  Physical Exam: BP 113/75 (BP Location: Right Arm, Patient Position: Sitting, Cuff Size: Normal)   Pulse 98   Temp 97.6 F (36.4 C) (Temporal)   Ht 5\' 8"  (1.727 m)   Wt 188 lb (85.3 kg)   SpO2 97%   BMI 28.59 kg/m  General:   Alert and  oriented. No distress noted. Pleasant and cooperative.  Head:  Normocephalic and atraumatic. Eyes:  Conjuctiva clear without scleral icterus. Heart:  S1, S2 present without murmurs appreciated. Lungs:  Clear to auscultation bilaterally. No wheezes, rales, or rhonchi. No distress.  Abdomen:  +BS, full but soft, mild to moderate TTP in the epigastric area.  No rebound or guarding. No HSM or masses noted. Msk:  Symmetrical without gross deformities. Normal posture. Extremities:  Without edema. Neurologic:  Alert and  oriented x4 Psych:  Normal mood and affect.    Assessment:  57 y.o. 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 He is presenting today for urgent visit for evaluation of nausea, vomiting, and epigastric pain.   N/V/Epigastric pain/chronic constipation with history of ileus:  Acute onset N/V/epigastric pain 4 days ago. Symptoms most consistent with acute viral gastroenteritis which he may have contracted from his sister and/or his caretaker who also had similar symptoms.  His symptoms may also be compounded by chronic conscription/ileus in the setting of chronic opoid therapy.  Symptoms are improving.  Last episode of vomiting was 1.5 days ago.  Nausea yesterday, but none today. Continues with epigastric pain, but this along with bloating is improving. Denies any worsening symptoms postprandially. Bowels are moving though not always productive. He did take his sisters Reglan which Dr. Levon Hedger was in agreement with.  Feels this helped his nausea better than Zofran, and he is asking for a prescription to have on hand if needed.  He does have history of very mild  intermittent nausea and certainly could have delayed gastric emptying in the setting of opioid therapy, so I will provide a limited supply of Reglan for him today. Otherwise, advised to increase pantoprazole to twice daily for a short course for possible gastritis, continue 2 capfuls of MiraLAX daily, and increase Senokot to 2 pills daily to help with bowel regularity.  Notably, patient had an abdominal x-ray completed 8/5 to evaluate the above symptoms, but a final read is not yet available. Dr. Levon Hedger reviewed the images and noted some gas distention in the upper abdomen, but no clear findings of obstruction.   Plan:  Increase pantoprazole to 40 mg twice daily for the next 2-4 weeks to see if this will help improve epigastric pain.  Okay to decrease back to once daily once symptoms resolve. Okay to use Reglan 5 mg every 6 hours as needed for nausea/vomiting if it were to return. May also continue to use Zofran as needed for chronic, mild intermittent nausea. Continue 2 capfuls of MiraLAX daily.  Recommended taking 1 capful in the morning and 1 capful in the evening rather than 2 capfuls at once. Increase Senokot to 2 pills every evening. Requested patient to let me know  if he has persistent abdominal pain or any worsening symptoms. Follow-up in 3 months or sooner if needed.   Ermalinda Memos, PA-C [facility]astroenter[facility] 12/24/2022   I have reviewed the note and agree with the APP's assessment as described in this progress note  If patient is any worsening upper abdominal pain and distention, it will be reasonable to proceed with an EGD in our hospital as this will be a much shorter procedure done a colonoscopy.  Will see how he does with the medication changes described above.  [name], MD Gastroenterology and Hepatology [facility]

## 2022-12-23 NOTE — Telephone Encounter (Signed)
Patient caregiver Lynden Ang called today states they want an appointment asap. Patient had been nauseated and vomiting starting two days ago it and ended yesterday. Patient has a history of a Trach and is running a low grade fever of 100.3. He had vomited several times once while he was asleep. He had an abdominal x ray done on Yesterday 12/22/2022 which has not been read as of yet. She started the patient on Reglan on 12/21/2022 as per recommended a few weeks ago by Dr. Levon Hedger. We have no availability with Dr. Levon Hedger until Next Monday. Leeroy Bock has an opening on Thursday 12/25/2022.

## 2022-12-23 NOTE — Telephone Encounter (Signed)
Spoke to patient will wait 01/12/2023

## 2022-12-23 NOTE — Telephone Encounter (Signed)
Per Dewayne Hatch, Dr. Tasia Catchings has an urgent opening tomorrow if ok for Dr. Tasia Catchings to see. But you have a new patient spot opened on Thursday. Please advise.

## 2022-12-23 NOTE — Telephone Encounter (Signed)
Patient scheduled for Acute OV on Friday.   Please advise on request for CXR prior to appt. Thanks!

## 2022-12-23 NOTE — Telephone Encounter (Signed)
Agree with this, I reviewed the images and there is some gas distention in the upper abdomen but no clear findings of obstruction, Will wait for final KUB reading and determine if he can be seen tomorrow urgently.  Please schedule an appointment tomorrow, but let them know if the vomiting or fever persists, they should take him to the ER.

## 2022-12-23 NOTE — Telephone Encounter (Signed)
FYI: This patient was placed on your schedule for tomorrow as you had an urgent opening for 12/24/2022 at 1:30 pm. Per Dr. Wilburt Finlay recommendations below. (They are aware of the date, location and the time to be there).  Agree with this, I reviewed the images and there is some gas distention in the upper abdomen but no clear findings of obstruction, Will wait for final KUB reading and determine if he can be seen tomorrow urgently.  Please schedule an appointment tomorrow, but let them know if the vomiting or fever persists, they should take him to the ER.  Per Care giver the patient is much better and his vomiting has dissipated, and he has an appetite and says fever has been reduced. She was advised if the patient worsens before appointment on 12/24/2022 to take to the nearest Ed. [name] the caregiver states understanding.

## 2022-12-23 NOTE — Telephone Encounter (Signed)
Thanks

## 2022-12-23 NOTE — Telephone Encounter (Signed)
Patient sister called and states that he is  having thick green phlegm mucus when suctioning trach, shortness of breath and wheezing for the past few days.  Patient also had recent symptoms of stomach issues (but has discussed this with gastrology office and has since resolved)   Patient would like to come in as an acute, Scheduled for 12/26/22 at 315 with Dr. Sherene Sires.  Patient would like to discuss the need for Chest xray prior to acute appointment.  Please call patient's sister Dina Rich [phone]

## 2022-12-23 NOTE — Telephone Encounter (Signed)
Fine for cxr pre ov  dx copd

## 2022-12-23 NOTE — Telephone Encounter (Signed)
CXR order placed.  Spoke with patient's sister and advised. Also advised if symptoms worsen before OV to seek evaluation at UC/ED. She voiced understanding. Reports will go tomorrow for CXR. Nothing further needed at this time.

## 2022-12-24 ENCOUNTER — Ambulatory Visit (INDEPENDENT_AMBULATORY_CARE_PROVIDER_SITE_OTHER): Payer: Medicaid Other | Admitting: Gastroenterology

## 2022-12-24 ENCOUNTER — Encounter: Payer: Medicaid Other | Admitting: Registered Nurse

## 2022-12-24 ENCOUNTER — Encounter: Payer: Self-pay | Admitting: Gastroenterology

## 2022-12-24 ENCOUNTER — Ambulatory Visit (HOSPITAL_COMMUNITY)
Admission: RE | Admit: 2022-12-24 | Discharge: 2022-12-24 | Disposition: A | Discharge status: Home / Self Care | Payer: Medicaid Other | Source: Ambulatory Visit | Attending: Internal Medicine | Admitting: Internal Medicine

## 2022-12-24 VITALS — BP 113/75 | HR 98 | Temp 97.6°F | Ht 68.0 in | Wt 188.0 lb

## 2022-12-24 DIAGNOSIS — K5903 Drug induced constipation: Secondary | ICD-10-CM

## 2022-12-24 DIAGNOSIS — J449 Chronic obstructive pulmonary disease, unspecified: Secondary | ICD-10-CM | POA: Insufficient documentation

## 2022-12-24 DIAGNOSIS — R1013 Epigastric pain: Secondary | ICD-10-CM

## 2022-12-24 DIAGNOSIS — R112 Nausea with vomiting, unspecified: Secondary | ICD-10-CM | POA: Diagnosis not present

## 2022-12-24 MED ORDER — METOCLOPRAMIDE HCL 5 MG PO TABS
5.0000 mg | ORAL_TABLET | Freq: Four times a day (QID) | ORAL | 0 refills | Status: AC | PRN
Start: 2022-12-24 — End: ?

## 2022-12-24 NOTE — Patient Instructions (Addendum)
Increase pantoprazole to 40 mg twice daily 30 minutes before breakfast and dinner for the next 2-4 weeks to see if this will help improve the pain in your upper mid abdomen.  You may go back to once daily once your upper abdominal pain resolves.  If you have any worsening abdominal pain, please let me know.  I am sending in a limited supply of Reglan for you to use as needed for nausea and vomiting.  You may also continue to use Zofran as needed for nausea.  Continue taking MiraLAX 2 capfuls daily.  I recommend that you split the dose to take 1 capful in the morning and 1 capful in the evening.  Increase Senokot to 2 pills every evening.  I will plan to see you back in 3 months or sooner if needed.   It was great to see you today!   Ermalinda Memos, PA-C [facility] Gastroenterology

## 2022-12-25 NOTE — Progress Notes (Signed)
[name], male    DOB: [birth year],    MRN: [id number]   Brief patient profile:  57 yowm illiterate though says finished 7th grade with asthma since in his 58s  just MM/quit smoking 08/2020 on disability since last PFT's ? 2017/18 relocated to [facility] summer 2021 and referred to pulmonary clinic in [place]  01/24/2020 by [name].  Admit date: 11/04/2019 Discharge date: 11/10/2019   Admitted From: Home Disposition: Home   Recommendations for Outpatient Follow-up:  Follow up with PCP in 1-2 weeks Please obtain BMP/CBC in one week   Home Health: Equipment/Devices: Home oxygen at 3 L, nebulizer machine   Discharge Condition: Stable CODE STATUS: Full code Diet recommendation: Heart healthy   Brief/Interim Summary: Curties Sassaman is a 57 y.o. male with a history of CAD, MI, COPD.  Patient just recently moved to [place] from [place].  He has no medications and has not seen a physician here.  He started having shortness of breath about 3 weeks ago which has been increasing.  He has been having cough with some purulent sputum production.  He had a relative give him some steroids over the past few days, without too much improvement.  No other palliating or provoking factors.  Denies fevers, chills, nausea, vomiting.   Emergency Department Course: Patient started on BiPAP and gradually weaned to nasal cannula.  Initial saturations were in the 80s.  Improved with steroids and BiPAP.  Remained stable on nasal cannula.   Discharge Diagnoses:  Principal Problem:   Acute respiratory failure with hypoxia (HCC) Active Problems:   Coronary artery disease   COPD with acute exacerbation (HCC)   Acute on chronic hypoxic respiratory failure secondary to acute COPD exacerbation Presented with hypoxia with O2 saturation in the 80s initially requiring NIV then transitioned to 3-4 L  -previously on 3L at home but has not used since Oct 2020 -Continue brovana and  pulmicort -Continue hypertonic saline nebs -continue duonebs -He was treated with IV steroids which have since been transitioned to prednisone taper -Personally reviewed chest x-ray which showed no lobular pulmonary infiltrates to suggest pneumonia -Maintain O2 saturation greater than 88 to 90% -TOC consulted to assist with PCP and pulmonology referrals -Currently, he can ambulate comfortably on 3 L of oxygen which appears to be near his baseline   -Acute COPD exacerbation Ongoing tobacco use; tobacco cessation counseling at bedside. COVID-19 negative Treated with IV steroids which was subsequently transitioned to prednisone taper Continue management as stated above -Continue Symbicort on discharge -Continue albuterol nebs as needed, provided with nebulizer machine   Hyperglycemia/impaired glucose tolerance Presented with elevated serum glucose Hemoglobin A1c 6.3 on 619 1221 Monitored on sliding scale insulin   Coronary artery disease status post PCI/history of MI Denies any anginal symptoms at the time of this exam Continue Brilinta  Not on statin, obtain lipid panel   Tobacco use disorder Tobacco cessation counseling Nicotine patch   BPH Continue terazosin Monitor urine output    History of Present Illness  01/24/2020  Pulmonary/ 1st office eval/  / [place] Office  Chief Complaint  Patient presents with   Pulmonary Consult    Referred by Tereasa Coop, NP.  Pt states hx of COPD. Pt c/o increased SOB since Winter 2020. He sometimes gets winded walking room to room at home. He is using albuterol inhaler and neb both 1-2 x per day.   Dyspnea:  [employer] walking / walk from [facility] space (sisters) / no steps but there  are times he can't get across the room s sob  Cough: better since quit  Sleep: flat bed/ 1 pillow  - no resp  SABA use:neb  once or twice daily "can't make it thru the day" and also using 2 different forms of hfa  02  Prn  rec Plan A = Automatic = Always=     Symbicort 160 Take 2 puffs first thing in am and then another 2 puffs about 12 hours later.  Work on inhaler technique: Plan B = Backup (to supplement plan A, not to replace it) Only use your albuterol inhaler as a rescue medication Plan C = Crisis (instead of Plan B but only if Plan B stops working) - only use your albuterol nebulizer if you first try Plan B and it fails to help  Make sure you check your oxygen saturations at highest level of activity  Please schedule a follow up visit in 3 months with pfts on return      05/31/2020  f/u ov/[place] office/ re: needs to go to lab / did stop smoking Chief Complaint  Patient presents with   Follow-up    Patient feels like breathing is the same since last visit. Cough but states that it is hard to get up mucus.   Dyspnea: basically housebound/ walks dog to porch / 25 ft bd Cough: non productive, last cig jun 2021 much better since then  Sleeping: flat bed/ no bipap/ wakes up feeling find SABA use: rarely neb,  Twice a daily proair never pre challenges or rechallenges  02: has it not using / not tracking sats with ex  Rec Plan A = Automatic = Always=    Symbicort 160 Take 2 puffs first thing in am and then another 2 puffs about 12 hours later.  Work on inhaler technique:     Plan B = Backup (to supplement plan A, not to replace it) Only use your albuterol inhaler as a rescue medication  Plan C = Crisis (instead of Plan B but only if Plan B stops working) - only use your albuterol nebulizer if you first try Plan B and it fails to help > ok to use the nebulizer up to every 4 hours but if start needing it regularly call for immediate appointment Make sure you check your oxygen saturations at highest level of activity to be sure it stays over 90%  Please remember to go to the lab and x-ray department at [facility]   for your tests - we will call you with the results when they are available. Please schedule a follow up visit in 3  months with pfts on return    Admitted August 17 2020 to [facility]  p neck fx > [facility] > d/c home with trach and PEG and f/u by Dr Jenne Pane who rec laser surgery on trach   12/24/2020  f/u ov/ office/ re: GOLD IV post hosp  Chief Complaint  Patient presents with   Follow-up    Patient states that he would rather have Symbicort over [facility] inhaler feels like it does not work as good. Patient was admitted on 10/09/20. Sister states he will need surgical clearance today for his ENT Dr. Jenne Pane  Dyspnea:  mb and back 100 ft flat s 02  Cough: clear phlegm  Sleeping: bed is flat / on back one pillow SABA use: neb twice daily  02: 4lpm  Covid status: 2 vax  Rec Ok to change dulera to symbicort 160  Take 2 puffs first thing in am and then another 2 puffs about 12 hours later.  You are cleared for surgery > Jenne Pane  02/06/21 balloon dilatation/ no laser       03/12/2022  f/u ov/[place] office/ re: 02 dep/ trach dep  maint on trach collar 3lpm   On formoterol bid but  no budesonide due to shortage  Chief Complaint  Patient presents with   Follow-up    Breathing is doing some days, not so good other days.  Pts sister has picture of mucus coughed up to show Dr. Sherene Sires today   Dyspnea:  50 ft / spends a lot of time sitting on side of bed - 4-5 lpm with ex to keep > 90%  Cough: sometimes quite thick brown sev days prior to OV not consistent   Sleeping: hosp bed at 30 degrees  SABA use: occ hfa  02: 3lpm  Rec If breathing worse > Prednisone 10 mg take  4 each am x 2 days,   2 each am x 2 days,  1 each am x 2 days and stop  Maximum mucinex 1200 mg twice daily and check with 02 company for a different humidity if available and also check with Dr Jenne Pane if this continues to be a problem   09/09/2022  f/u ov/[place] office/ re:  trach dep/ AB budesonide/performist   Chief Complaint  Patient presents with   Follow-up    Pt f/u states that he is doing ok, currently using 1L O2 and sats  are above 90%. He does increase O2 to 3L w/ movement. No questions or concerns  Dyspnea:  bathroom and back  Cough: varies sometimes dark  Sleeping: bed is flat/ one pillow  SABA use: once daily  02: 3lpm  Rec No change in medications/ all trach care per Dr Jenne Pane Please schedule a follow up visit in 6  months but call sooner if needed     12/26/2022  Acute  ov/[place] office/ re: trach dep/AB  maint on performist/ bud   Chief Complaint  Patient presents with   Acute Visit    Wheezing/shob  Dyspnea:  worse x10 days / not following AVS instructions/ Action plan  Cough: mucus turned dark green  a few days prior to OV   Sleeping: flat bed with one pillow does fine  SABA use: using in place of bud/ performist  02: 3lpm not titrating as rec      No obvious day to day or daytime variability or assoc  mucus plugs or hemoptysis or cp or chest tightness,  or overt sinus or hb symptoms.   sleeping without nocturnal  or early am exacerbation  of respiratory  c/o's or need for noct saba. Also denies any obvious fluctuation of symptoms with weather or environmental changes or other aggravating or alleviating factors except as outlined above   No unusual exposure hx or h/o childhood pna/ asthma or knowledge of premature birth.  Current Allergies, Complete Past Medical History, Past Surgical History, Family History, and Social History were reviewed in [employer] record.  ROS  The following are not active complaints unless bolded Hoarseness, sore throat, dysphagia, dental problems, itching, sneezing,  nasal congestion or discharge of excess mucus or purulent secretions, ear ache,   fever, chills, sweats, unintended wt loss or wt gain, classically pleuritic or exertional cp,  orthopnea pnd or arm/hand swelling  or leg swelling, presyncope, palpitations, abdominal pain improving , anorexia, nausea, vomiting resolved  diarrhea  or change in bowel habits or change in bladder  habits, change in stools or change in urine, dysuria, hematuria,  rash, arthralgias, visual complaints, headache, numbness, weakness or ataxia or problems with walking or coordination,  change in mood or  memory.        Current Meds  Medication Sig   acetaminophen (TYLENOL) 325 MG tablet Take 2 tablets (650 mg total) by mouth every 6 (six) hours as needed for mild pain or moderate pain.   albuterol (VENTOLIN HFA) 108 (90 Base) MCG/ACT inhaler INHALE 1 OR 2 PUFFS INTO THE LUNGS EVERY 4 HOURS AS NEEDED FOR WHEEZING OR SHORTNESS OF BREATH   aspirin 81 MG chewable tablet Chew 1 tablet (81 mg total) by mouth daily.   bethanechol (URECHOLINE) 25 MG tablet TAKE 2 TABLETS BY MOUTH (4) TIMES DAILY   budesonide (PULMICORT) 0.25 MG/2ML nebulizer solution Generic for Pulmicort. Inhale 1 vial via nebulizer twice daily with Perforomist.   buPROPion (WELLBUTRIN XL) 300 MG 24 hr tablet Take 1 tablet (300 mg total) by mouth daily.   busPIRone (BUSPAR) 10 MG tablet Take 1 tablet (10 mg total) by mouth 2 (two) times daily.   Calcium Carb-Cholecalciferol (CALCIUM 600/VITAMIN D) 600-400 MG-UNIT CHEW Chew 1 each by mouth daily.   Cholecalciferol (VITAMIN D3) 125 MCG (5000 UT) CAPS Take 5,000 Units by mouth daily.   clonazePAM (KLONOPIN) 0.5 MG tablet TAKE 1 TABLET BY MOUTH AT BEDTIME.   Docusate Sodium (STOOL SOFTENER LAXATIVE PO) Take 200 mg by mouth daily. 2-4 tablets   folic acid (FOLVITE) 1 MG tablet TAKE ONE TABLET BY MOUTH ONCE DAILY.   formoterol (PERFOROMIST) 20 MCG/2ML nebulizer solution Take 2 mLs (20 mcg total) by nebulization 2 (two) times daily. Use in nebulizer twice daily perfectly regularly   metoCLOPramide (REGLAN) 5 MG tablet Take 1 tablet (5 mg total) by mouth every 6 (six) hours as needed for nausea.   metoprolol succinate (TOPROL XL) 25 MG 24 hr tablet Take 0.5 tablets (12.5 mg total) by mouth daily.   Misc. Devices (TRANSPORT CHAIR) MISC For transporting to and from Doctors appointments    montelukast (SINGULAIR) 10 MG tablet Take 1 tablet (10 mg total) by mouth at bedtime.   nitroGLYCERIN (NITROSTAT) 0.4 MG SL tablet Place 1 tablet (0.4 mg total) under the tongue every 5 (five) minutes as needed for chest pain.   nystatin ointment (MYCOSTATIN) APPLY AROUND TRACH SITE TWICE DAILY FOR 2 WEEKS. MIX WITH TRIAMCINOLONE.   ondansetron (ZOFRAN) 4 MG tablet TAKE (1) TABLET BY MOUTH EVERY EIGHT HOURS AS NEEDED.   oxyCODONE (OXY IR/ROXICODONE) 5 MG immediate release tablet Take 1 tablet (5 mg total) by mouth 4 (four) times daily as needed for moderate pain.   oxyCODONE ER (XTAMPZA ER) 13.5 MG C12A Take 1 capsule by mouth every 12 (twelve) hours.   OXYGEN Inhale 3 L into the lungs continuous.   pantoprazole (PROTONIX) 40 MG tablet Take 1 tablet (40 mg total) by mouth daily.   polyethylene glycol powder (GLYCOLAX/MIRALAX) 17 GM/SCOOP powder Take 255 g by mouth daily. (Patient taking differently: Take 34 g by mouth daily.)   QUEtiapine (SEROQUEL) 300 MG tablet TAKE (1/2) A TABLET BY MOUTH TWICE A DAY, AND (1) TABLET AT BEDTIME.   rosuvastatin (CRESTOR) 5 MG tablet TAKE ONE TABLET BY MOUTH ONCE DAILY   senna (SENOKOT) 8.6 MG tablet Take 1 tablet by mouth at bedtime.   tamsulosin (FLOMAX) 0.4 MG CAPS capsule Take 1 capsule (0.4 mg total) by mouth daily.  terazosin (HYTRIN) 5 MG capsule Take 5 mg by mouth daily.   triamcinolone ointment (KENALOG) 0.1 % APPLY AROUND TRACH SITE TWICE DAILY FOR 2 WEEKS. MIX WITH NYSTATIN.             Past Medical History:  Diagnosis Date   Acute respiratory failure with hypoxia (HCC) 11/04/2019   Anxiety    Arthritis    COPD (chronic obstructive pulmonary disease) (HCC)    COPD with acute exacerbation (HCC) 11/04/2019   Coronary artery disease    Depression    Emphysema of lung (HCC)    Hypertension    Myocardial infarct [facility])    Oxygen deficiency       Objective:   wts  12/26/2022        189  09/09/2022      197  03/12/2022    203  12/18/2021         194  06/19/2021        182 12/24/2020        181  05/31/20 194 lb 12.8 oz (88.4 kg)  03/06/20 186 lb (84.4 kg)  01/27/20 186 lb (84.4 kg)   Vital signs reviewed  12/26/2022  - Note at rest 02 sats  94% on 3lpm T Collar    General appearance:    amb wm/ trach in place   HEENT : Oropharynx  clear    NECK :  without  apparent JVD/ palpable Nodes/TM    LUNGS: no acc muscle use,  Mild barrel  contour chest wall with bilateral  Distant bs s audible wheeze and  without cough on insp or exp maneuvers  and mild  Hyperresonant  to  percussion bilaterally     CV:  RRR  no s3 or murmur or increase in P2, and no edema   ABD:  obese soft and nontender   MS:  Nl gait/ ext warm without deformities Or obvious joint restrictions  calf tenderness, cyanosis or clubbing     SKIN: warm and dry without lesions    NEURO:  alert, approp, nl sensorium with  no motor or cerebellar deficits apparent.     I personally reviewed images and agree with radiology impression as follows:  CXR:   pa and lateral  12/24/22  No acute changes   Assessment

## 2022-12-26 ENCOUNTER — Ambulatory Visit (INDEPENDENT_AMBULATORY_CARE_PROVIDER_SITE_OTHER): Payer: Medicaid Other | Admitting: Internal Medicine

## 2022-12-26 ENCOUNTER — Encounter: Payer: Self-pay | Admitting: Internal Medicine

## 2022-12-26 VITALS — BP 111/69 | HR 92 | Ht 68.0 in | Wt 189.0 lb

## 2022-12-26 DIAGNOSIS — J9611 Chronic respiratory failure with hypoxia: Secondary | ICD-10-CM

## 2022-12-26 DIAGNOSIS — J449 Chronic obstructive pulmonary disease, unspecified: Secondary | ICD-10-CM

## 2022-12-26 MED ORDER — ALBUTEROL SULFATE (2.5 MG/3ML) 0.083% IN NEBU: 2.5000 mg | INHALATION_SOLUTION | Freq: Four times a day (QID) | RESPIRATORY_TRACT | 12 refills | Status: DC | PRN

## 2022-12-26 MED ORDER — AZITHROMYCIN 250 MG PO TABS: ORAL_TABLET | ORAL | 0 refills | Status: DC

## 2022-12-26 NOTE — Assessment & Plan Note (Signed)
Quit smoking 08/2020/ MM - 01/24/2020  After extensive coaching inhaler device,  effectiveness =    75% (short Ti) > change symb 80 to 160 2bid  -  PFT's   05/01/20   FEV1 0.97 (27 % ) ratio 0.30  p 1 % improvement from saba p symbicort prior to study with DLCO  11.59 (43%) corrects to 2.0 (45%)  for alv volume and FV curve severe airflow obst with 35% improvement in FVC p saba  - 05/31/2020  After extensive coaching inhaler device,  effectiveness =    Short Ti, prostate problems so rx symbicort 160 2bid and prn saba -  05/31/2020   Walked RA  approx   300 ft  @ moderate pace  stopped due to  Sob at end but sats still 94%    - Labs ordered 12/24/2020  :    alpha one AT phenotype  MM level 154   Mild flare with tracheobronitis p likely viral uri   Rec check covid testing available thru drugstore Zpak Hold pred for now as not wheezing   F/u q 3 m, sooner prn with all meds in hand using a trust but verify approach to confirm accurate Medication  Reconciliation The principal here is that until we are certain that the  patients are doing what we've asked, it makes no sense to ask them to do more.

## 2022-12-26 NOTE — Patient Instructions (Addendum)
Plan A = Automatic = Always=  Performist (formoterol) 20 and budesonide 0.25 mg 1st thing and 12 hours later   Plan B = Backup (to supplement plan A, not to replace it) Only use your albuterol inhaler as a rescue medication to be used if you can't catch your breath by resting or doing a relaxed purse lip breathing pattern.  - The less you use it, the better it will work when you need it. - Ok to use the inhaler up to 2 puffs  every 4 hours if you must but call for appointment if use goes up over your usual need - Don't leave home without it !!  (think of it like the spare tire for your car)    Plan C = Crisis (instead of Plan B but only if Plan B stops working) - only use your albuterol nebulizer if you first try Plan B and it fails to help > ok to use the nebulizer up to every 4 hours but if start needing it regularly call for immediate appointment  Zpak   Make sure you check your oxygen saturation  AT  your highest level of activity (not after you stop)   to be sure it stays over 90% and adjust  02 flow upward to maintain this level if needed but remember to turn it back to previous settings when you stop (to conserve your supply).   Please schedule a follow up visit in 3 months but call sooner if needed  with all medications /inhalers/ solutions in hand so we can verify exactly what you are taking. This includes all medications from all doctors and over the counters

## 2022-12-26 NOTE — Assessment & Plan Note (Signed)
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 Patient wanted to stop walk after 180ft due to SOB and being unsteady with lowest sats 94%   Again advised: Make sure you check your oxygen saturation  AT  your highest level of activity (not after you stop)   to be sure it stays over 90% and adjust  02 flow upward to maintain this level if needed but remember to turn it back to previous settings when you stop (to conserve your supply).          Each maintenance medication was reviewed in detail including emphasizing most importantly the difference between maintenance and prns and under what circumstances the prns are to be triggered using an action plan format where appropriate.  Total time for H and P, chart review, counseling, reviewing neb/02/pulse ox device(s) and generating customized AVS unique to this ACUTE office visit / same day charting = 31 min

## 2022-12-29 ENCOUNTER — Other Ambulatory Visit: Payer: Self-pay | Admitting: Physical Medicine & Rehabilitation

## 2022-12-29 ENCOUNTER — Other Ambulatory Visit: Payer: Self-pay | Admitting: Internal Medicine

## 2022-12-29 ENCOUNTER — Telehealth: Payer: Self-pay | Admitting: *Deleted

## 2022-12-29 ENCOUNTER — Other Ambulatory Visit: Payer: Self-pay | Admitting: *Deleted

## 2022-12-29 ENCOUNTER — Other Ambulatory Visit: Payer: Self-pay | Admitting: Registered Nurse

## 2022-12-29 DIAGNOSIS — S22000G Wedge compression fracture of unspecified thoracic vertebra, subsequent encounter for fracture with delayed healing: Secondary | ICD-10-CM

## 2022-12-29 DIAGNOSIS — G894 Chronic pain syndrome: Secondary | ICD-10-CM

## 2022-12-29 MED ORDER — OXYCODONE HCL 5 MG PO TABS
5.0000 mg | ORAL_TABLET | Freq: Four times a day (QID) | ORAL | 0 refills | Status: DC | PRN
Start: 2022-12-29 — End: 2023-01-16

## 2022-12-29 NOTE — Telephone Encounter (Signed)
PMP was Reviewed.  Clonazepam e-scribed.  [facility] was called regarding Hytrin, awaiting a return call.

## 2022-12-29 NOTE — Telephone Encounter (Signed)
PMP was Reviewed.  Oxycodone e-scribed.  Mr. Gumz sister [name] was called regarding the above, she verbalizes understanding.

## 2022-12-29 NOTE — Telephone Encounter (Signed)
Submission Date:08/12/2024Status:APPROVED  Effective Begin Date:08/12/2024Effective End Date:06/27/2023  Pharmacy and [name] notified.

## 2022-12-29 NOTE — Telephone Encounter (Signed)
Prior auth for oxycodone 5 mg #120 submitted to [employer] .Confirmation #:[id number].

## 2022-12-30 ENCOUNTER — Telehealth: Payer: Self-pay | Admitting: *Deleted

## 2022-12-30 MED ORDER — TERAZOSIN HCL 5 MG PO CAPS: 5.0000 mg | ORAL_CAPSULE | Freq: Every day | ORAL | 2 refills | Status: DC

## 2022-12-30 NOTE — Telephone Encounter (Signed)
Dr Riley Kill note was reviewed. Medication list was reviewed: Dr Riley Kill name not listed on Hyrtin: Historical Provider. This provider asked for family to call office on 12/29/2022 Hytrin order today, call placed to [name] Mr. Bussell sister, she verbalizes understanding.

## 2022-12-30 NOTE — Telephone Encounter (Signed)
Patient's sister would like a call back about denial of terazosin. She says this was something that was discussed with Dr.Swartz and he agreed that patient needed to be on this medication.  Please call [name] ASAP!

## 2023-01-05 ENCOUNTER — Other Ambulatory Visit: Payer: Self-pay | Admitting: Registered Nurse

## 2023-01-05 DIAGNOSIS — G894 Chronic pain syndrome: Secondary | ICD-10-CM

## 2023-01-05 DIAGNOSIS — S22000G Wedge compression fracture of unspecified thoracic vertebra, subsequent encounter for fracture with delayed healing: Secondary | ICD-10-CM

## 2023-01-05 NOTE — Telephone Encounter (Signed)
PMP was Reviewed,  Charles Weeks prescription e-scribed to pharmacy.  Mr/ Charles Weeks sister Charles Weeks was called regarding the above.  Charles Weeks scheduled was changed , she verbalizes understanding.

## 2023-01-07 ENCOUNTER — Encounter: Payer: Medicaid Other | Admitting: Registered Nurse

## 2023-01-07 ENCOUNTER — Other Ambulatory Visit: Payer: Self-pay

## 2023-01-07 ENCOUNTER — Other Ambulatory Visit: Payer: Self-pay | Admitting: Internal Medicine

## 2023-01-07 ENCOUNTER — Encounter: Payer: Self-pay | Admitting: Internal Medicine

## 2023-01-07 DIAGNOSIS — J449 Chronic obstructive pulmonary disease, unspecified: Secondary | ICD-10-CM

## 2023-01-07 MED ORDER — ALBUTEROL SULFATE HFA 108 (90 BASE) MCG/ACT IN AERS
INHALATION_SPRAY | RESPIRATORY_TRACT | 0 refills | Status: AC
Start: 2023-01-07 — End: ?

## 2023-01-08 ENCOUNTER — Other Ambulatory Visit: Payer: Self-pay

## 2023-01-08 MED ORDER — FORMOTEROL FUMARATE 20 MCG/2ML IN NEBU: 20.0000 ug | INHALATION_SOLUTION | Freq: Two times a day (BID) | RESPIRATORY_TRACT | 11 refills | Status: DC

## 2023-01-10 ENCOUNTER — Other Ambulatory Visit: Payer: Self-pay | Admitting: Internal Medicine

## 2023-01-13 ENCOUNTER — Encounter: Payer: Self-pay | Admitting: Internal Medicine

## 2023-01-13 ENCOUNTER — Ambulatory Visit (INDEPENDENT_AMBULATORY_CARE_PROVIDER_SITE_OTHER): Payer: Medicaid Other | Admitting: Internal Medicine

## 2023-01-13 VITALS — BP 104/72 | HR 93 | Ht 68.5 in | Wt 190.0 lb

## 2023-01-13 DIAGNOSIS — F3341 Major depressive disorder, recurrent, in partial remission: Secondary | ICD-10-CM | POA: Diagnosis not present

## 2023-01-13 DIAGNOSIS — J398 Other specified diseases of upper respiratory tract: Secondary | ICD-10-CM

## 2023-01-13 DIAGNOSIS — J449 Chronic obstructive pulmonary disease, unspecified: Secondary | ICD-10-CM | POA: Diagnosis not present

## 2023-01-13 DIAGNOSIS — E1165 Type 2 diabetes mellitus with hyperglycemia: Secondary | ICD-10-CM | POA: Diagnosis not present

## 2023-01-13 DIAGNOSIS — Z2821 Immunization not carried out because of patient refusal: Secondary | ICD-10-CM

## 2023-01-13 NOTE — Assessment & Plan Note (Signed)
Followed by pulmonology (Dr. Sherene Sires).  Seen for follow-up earlier this month.  He is currently prescribed Pulmicort and Perforomist.  Pulmonary exam is unremarkable.  No medication changes have been made today.

## 2023-01-13 NOTE — Assessment & Plan Note (Signed)
Charles Weeks remains in place.  Followed by ENT (Dr. Jenne Pane).  His sister states that she has scheduled a follow-up appointment for next week.

## 2023-01-13 NOTE — Assessment & Plan Note (Signed)
New diagnosis, meeting criteria with A1c 6.5 on labs from June.  Lifestyle changes aimed at improving his blood sugar were recommended.  His sister and caregiver report that he has intermittently adhered to previously recommended guidelines. -No medication changes today.  We will repeat A1c at follow-up in 3 months.  In the interim, he was strongly advised to focus on dietary changes aimed at lowering his blood sugar. -Urine microalbumin/creatinine ratio ordered today

## 2023-01-13 NOTE — Assessment & Plan Note (Signed)
Mood remains stable with Seroquel, Wellbutrin, BuSpar, and clonazepam.  Seroquel and clonazepam are currently managed by PMNR.  BuSpar frequency was reduced to twice daily at his last appointment.  No additional changes are indicated today.

## 2023-01-13 NOTE — Progress Notes (Addendum)
Established Patient Office Visit  Subjective   Patient ID: Charles Weeks, male    DOB: Jun 25, 1965  Age: 57 y.o. MRN: [id number]  Chief Complaint  Patient presents with   COPD    Follow up   mdd    Follow up   Charles Weeks returns to care today for follow-up and first evaluation by me.  He was last seen at [facility] on 6/6 by Dr. Allena Katz.  BuSpar was reduced to twice daily use and 21-month follow-up was arranged.  In the interim he has been evaluated by PM&R, cardiology, pulmonology, and gastroenterology.  Charles Weeks has an extensive past medical history notable for chronic hypoxic respiratory failure, COPD, GERD, CAD with history of prior MI and DES to RCA (2020), HLD, history of thoracic spine fracture, subglottic stenosis/tracheostomy, and chronic pain syndrome.  He reports feeling well today and has no acute concerns to discuss.  He is accompanied by his sister, Charles Weeks, as well as his home caregiver, Charles Weeks.  Past Medical History:  Diagnosis Date   Anxiety 01/19/2020   Arthritis    Asthma    Benign prostatic hyperplasia with weak urinary stream 12/08/2019   BPH (benign prostatic hyperplasia) 08/17/2020   Chronic respiratory failure with hypoxia (HCC) 01/24/2020   Has home 02 but not using as of 01/24/2020  -  01/24/2020   Walked RA  approx   200 ft  @ slow pace  stopped due to  Dizzy with sats still 95%      Cigarette smoker 01/24/2020   Stopped regular smoking 10/2019 but still "now and then" as of 01/24/2020    COPD (chronic obstructive pulmonary disease) (HCC) 08/17/2020   gold   COPD GOLD ?     Quit smoking 10/2019  - Labs ordered 01/24/2020  :  allergy profile   alpha one AT phenotype   - 01/24/2020  After extensive coaching inhaler device,  effectiveness =    75% (short Ti) > change symb 80 to 160 2bid      Coronary artery disease 06/08/2018   DES to RCA, [facility] [place], [place])   Depression, major, single episode, moderate (HCC) 01/19/2020   Dyspnea    Encounter for  screening for malignant neoplasm of colon 12/08/2019   Essential hypertension 12/08/2019   History of MI (myocardial infarction) 01/19/2020   HTN (hypertension) 08/17/2020   Myocardial infarct (HCC) 2019   Pneumonia    Positive colorectal cancer screening using Cologuard test    Past Surgical History:  Procedure Laterality Date   BALLOON DILATION N/A 02/06/2021   Procedure: BALLOON DILATION;  Surgeon: Christia Reading, MD;  Location: [facility] OR;  Service: ENT;  Laterality: N/A;   BOWEL RESECTION  2006   COLONOSCOPY  03/2022   [facility]   MICROLARYNGOSCOPY WITH LASER AND BALLOON DILATION N/A 02/06/2021   Procedure: SUSPENDED MICRO DIRECT LARYNGOSCOPY;  Surgeon: Christia Reading, MD;  Location: [facility] OR;  Service: ENT;  Laterality: N/A;   PEG PLACEMENT N/A 09/03/2020   Procedure: PERCUTANEOUS ENDOSCOPIC GASTROSTOMY (PEG) PLACEMENT;  Surgeon: Violeta Gelinas, MD;  Location: [facility] OR;  Service: General;  Laterality: N/A;   PERCUTANEOUS TRACHEOSTOMY N/A 09/03/2020   Procedure: PERCUTANEOUS TRACHEOSTOMY USING 6 SHILEY;  Surgeon: Violeta Gelinas, MD;  Location: [facility] OR;  Service: General;  Laterality: N/A;   SKIN GRAFT     TONSILLECTOMY AND ADENOIDECTOMY     Social History   Tobacco Use   Smoking status: Former    Current packs/day: 0.00    Types:  Cigarettes    Quit date: 08/17/2020    Years since quitting: 2.4   Smokeless tobacco: Never  Vaping Use   Vaping status: Never Used  Substance Use Topics   Alcohol use: Not Currently   Drug use: Not Currently   Family History  Problem Relation Age of Onset   Heart disease Mother    Heart disease Father    Diabetes Sister    Allergies  Allergen Reactions   Ciprofloxacin Hives   Review of Systems  Constitutional:  Negative for chills and fever.  HENT:  Negative for sore throat.   Respiratory:  Negative for cough and shortness of breath.   Cardiovascular:  Negative for chest pain, palpitations and leg swelling.  Gastrointestinal:  Negative for abdominal  pain, blood in stool, constipation, diarrhea, nausea and vomiting.  Genitourinary:  Negative for dysuria and hematuria.  Musculoskeletal:  Negative for myalgias.  Skin:  Negative for itching and rash.  Neurological:  Negative for dizziness and headaches.  Psychiatric/Behavioral:  Negative for depression and suicidal ideas.      Objective:     BP 104/72   Pulse 93   Ht 5' 8.5" (1.74 m)   Wt 190 lb (86.2 kg)   SpO2 93%   BMI 28.47 kg/m  BP Readings from Last 3 Encounters:  01/13/23 104/72  12/26/22 111/69  12/24/22 113/75   Physical Exam Vitals reviewed.  Constitutional:      General: He is not in acute distress.    Appearance: Normal appearance. He is obese. He is not ill-appearing.  HENT:     Head: Normocephalic and atraumatic.     Right Ear: External ear normal.     Left Ear: External ear normal.     Nose: Nose normal. No congestion or rhinorrhea.     Mouth/Throat:     Mouth: Mucous membranes are moist.     Pharynx: Oropharynx is clear.  Eyes:     General: No scleral icterus.    Extraocular Movements: Extraocular movements intact.     Conjunctiva/sclera: Conjunctivae normal.     Pupils: Pupils are equal, round, and reactive to light.  Cardiovascular:     Rate and Rhythm: Normal rate and regular rhythm.     Pulses: Normal pulses.     Heart sounds: Normal heart sounds. No murmur heard. Pulmonary:     Effort: Pulmonary effort is normal.     Breath sounds: Normal breath sounds. No wheezing, rhonchi or rales.     Comments: Trach in place.  Examined on 3 L [place] Abdominal:     General: Abdomen is flat. Bowel sounds are normal. There is no distension.     Palpations: Abdomen is soft.     Tenderness: There is no abdominal tenderness.  Musculoskeletal:        General: No swelling or deformity. Normal range of motion.     Cervical back: Normal range of motion.  Skin:    General: Skin is warm and dry.     Capillary Refill: Capillary refill takes less than 2 seconds.   Neurological:     General: No focal deficit present.     Mental Status: He is alert and oriented to person, place, and time.     Motor: No weakness.  Psychiatric:        Mood and Affect: Mood normal.        Behavior: Behavior normal.        Thought Content: Thought content normal.   Last CBC Lab  Results  Component Value Date   WBC 7.1 10/23/2022   HGB 14.0 10/23/2022   HCT 42.6 10/23/2022   MCV 91 10/23/2022   MCH 29.8 10/23/2022   RDW 12.7 10/23/2022   PLT 214 10/23/2022   Last metabolic panel Lab Results  Component Value Date   GLUCOSE 131 (H) 10/23/2022   NA 143 10/23/2022   K 4.2 10/23/2022   CL 100 10/23/2022   CO2 28 10/23/2022   BUN 8 10/23/2022   CREATININE 0.91 10/23/2022   EGFR 98 10/23/2022   CALCIUM 9.6 10/23/2022   PHOS 4.2 11/28/2021   PROT 6.5 10/23/2022   ALBUMIN 4.4 10/23/2022   LABGLOB 2.1 10/23/2022   AGRATIO 2.1 10/23/2022   BILITOT 0.3 10/23/2022   ALKPHOS 117 10/23/2022   AST 19 10/23/2022   ALT 20 10/23/2022   ANIONGAP 4 (L) 11/28/2021   Last lipids Lab Results  Component Value Date   CHOL 137 03/05/2022   HDL 42 03/05/2022   LDLCALC 35 03/05/2022   TRIG 298 (H) 03/05/2022   CHOLHDL 3.3 03/05/2022   Last hemoglobin A1c Lab Results  Component Value Date   HGBA1C 6.5 (H) 10/23/2022   Last thyroid functions Lab Results  Component Value Date   TSH 2.590 10/23/2022     Assessment & Plan:   Problem List Items Addressed This Visit       COPD GOLD 4/ 02 dep     Followed by pulmonology (Dr. Sherene Sires).  Seen for follow-up earlier this month.  He is currently prescribed Pulmicort and Perforomist.  Pulmonary exam is unremarkable.  No medication changes have been made today.      Tracheal stenosis    Janina Mayo remains in place.  Followed by ENT (Dr. Jenne Pane).  His sister states that she has scheduled a follow-up appointment for next week.      Type 2 diabetes mellitus with hyperglycemia (HCC)    New diagnosis, meeting criteria with A1c  6.5 on labs from June.  Lifestyle changes aimed at improving his blood sugar were recommended.  His sister and caregiver report that he has intermittently adhered to previously recommended guidelines. -No medication changes today.  We will repeat A1c at follow-up in 3 months.  In the interim, he was strongly advised to focus on dietary changes aimed at lowering his blood sugar. -Urine microalbumin/creatinine ratio ordered today      MDD (major depressive disorder)    Mood remains stable with Seroquel, Wellbutrin, BuSpar, and clonazepam.  Seroquel and clonazepam are currently managed by PMNR.  BuSpar frequency was reduced to twice daily at his last appointment.  No additional changes are indicated today.      *To the patient's complicated history (chronic hypoxic respiratory failure, COPD, thoracic spine fracture, subglottic stenosis s/p tracheostomy, and chronic pain syndrome) he would benefit from continued use of home medical equipment including a hospital bed and transport chair.  Return in about 3 months (around 04/15/2023).   Billie Lade, MD

## 2023-01-13 NOTE — Patient Instructions (Signed)
It was a pleasure to see you today.  Thank you for giving [place] the opportunity to be involved in your care.  Below is a brief recap of your visit and next steps.  We will plan to see you again in 3 months.  Summary No medication changes today Check urine study We will plan for follow up in 3 months

## 2023-01-15 LAB — MICROALBUMIN / CREATININE URINE RATIO
Creatinine, Urine: 160.1 mg/dL
Microalb/Creat Ratio: 3 mg/g{creat} (ref 0–29)
Microalbumin, Urine: 5.5 ug/mL

## 2023-01-16 ENCOUNTER — Encounter: Payer: Self-pay | Admitting: Registered Nurse

## 2023-01-16 ENCOUNTER — Encounter: Payer: Medicaid Other | Attending: Physical Medicine & Rehabilitation | Admitting: Registered Nurse

## 2023-01-16 VITALS — BP 105/70 | HR 83 | Ht 68.5 in | Wt 190.0 lb

## 2023-01-16 DIAGNOSIS — G8929 Other chronic pain: Secondary | ICD-10-CM | POA: Diagnosis present

## 2023-01-16 DIAGNOSIS — S22000G Wedge compression fracture of unspecified thoracic vertebra, subsequent encounter for fracture with delayed healing: Secondary | ICD-10-CM | POA: Diagnosis present

## 2023-01-16 DIAGNOSIS — Z5181 Encounter for therapeutic drug level monitoring: Secondary | ICD-10-CM | POA: Diagnosis present

## 2023-01-16 DIAGNOSIS — J398 Other specified diseases of upper respiratory tract: Secondary | ICD-10-CM | POA: Insufficient documentation

## 2023-01-16 DIAGNOSIS — Z79899 Other long term (current) drug therapy: Secondary | ICD-10-CM | POA: Insufficient documentation

## 2023-01-16 DIAGNOSIS — M546 Pain in thoracic spine: Secondary | ICD-10-CM | POA: Diagnosis present

## 2023-01-16 DIAGNOSIS — G894 Chronic pain syndrome: Secondary | ICD-10-CM | POA: Diagnosis present

## 2023-01-16 DIAGNOSIS — M545 Low back pain, unspecified: Secondary | ICD-10-CM | POA: Insufficient documentation

## 2023-01-16 MED ORDER — XTAMPZA ER 13.5 MG PO C12A: 1.0000 | EXTENDED_RELEASE_CAPSULE | Freq: Two times a day (BID) | ORAL | 0 refills | Status: DC

## 2023-01-16 MED ORDER — OXYCODONE HCL 5 MG PO TABS
5.0000 mg | ORAL_TABLET | Freq: Four times a day (QID) | ORAL | 0 refills | Status: AC | PRN
Start: 2023-01-16 — End: ?

## 2023-01-16 NOTE — Progress Notes (Signed)
Subjective:    Patient ID: Charles Weeks, male    DOB: 08-09-1965, 56 y.o.   MRN: [id number]  HPI: Charles Weeks is a 57 y.o. male who returns for follow up appointment for chronic pain and medication refill. He states his pain is located in his lower back. He  rates his pain 8. His current exercise regime is walking and performing stretching exercises.  Charles Weeks Morphine equivalent is 75.00 MME. He is also prescribed Clonazepam .We have discussed the black box warning of using opioids and benzodiazepines. I highlighted the dangers of using these drugs together and discussed the adverse events including respiratory suppression, overdose, cognitive impairment and importance of compliance with current regimen. We will continue to monitor and adjust as indicated.        Pain Inventory Average Pain 9 Pain Right Now 8 My pain is intermittent, burning, dull, and aching  In the last 24 hours, has pain interfered with the following? General activity 10 Relation with others 9 Enjoyment of life 10 What TIME of day is your pain at its worst? morning , evening, and night Sleep (in general) Fair  Pain is worse with: walking, bending, standing, and some activites Pain improves with: heat/ice and medication Relief from Meds: 6  Family History  Problem Relation Age of Onset   Heart disease Mother    Heart disease Father    Diabetes Sister    Social History   Socioeconomic History   Marital status: Divorced    Spouse name: Not on file   Number of children: 2   Years of education: Not on file   Highest education level: 7th grade  Occupational History   Not on file  Tobacco Use   Smoking status: Former    Current packs/day: 0.00    Types: Cigarettes    Quit date: 08/17/2020    Years since quitting: 2.4   Smokeless tobacco: Never  Vaping Use   Vaping status: Never Used  Substance and Sexual Activity   Alcohol use: Not Currently   Drug use: Not Currently   Sexual activity: Not  on file  Other Topics Concern   Not on file  Social History Narrative   ** Merged History Encounter **       Lives with sister  Both daughters live close by   Sister has dog   Enjoys: shooting pool, fishing, Therapist, nutritional  Diet: eats all food groups Caffeine: pop-dr pepper 2 liter  Water: 4-6 cups   Wears seat belt  No driving for him Psychologist, sport and exercise  Fi   re Extinguisher No weapons        Social Determinants of Health   Financial Resource Strain: Low Risk  (10/23/2022)   Overall Financial Resource Strain (CARDIA)    Difficulty of Paying Living Expenses: Not very hard  Food Insecurity: No Food Insecurity (10/23/2022)   Hunger Vital Sign    Worried About Running Out of Food in the Last Year: Never true    Ran Out of Food in the Last Year: Never true  Transportation Needs: No Transportation Needs (10/23/2022)   PRAPARE - Administrator, Civil Service (Medical): No    Lack of Transportation (Non-Medical): No  Physical Activity: Unknown (10/23/2022)   Exercise Vital Sign    Days of Exercise per Week: 0 days    Minutes of Exercise per Session: Not on file  Stress: No Stress Concern Present (10/23/2022)   [employer] of Occupational Health - Occupational Stress Questionnaire  Feeling of Stress : Only a little  Social Connections: Socially Isolated (10/23/2022)   Social Connection and Isolation Panel [NHANES]    Frequency of Communication with Friends and Family: Once a week    Frequency of Social Gatherings with Friends and Family: More than three times a week    Attends Religious Services: Never    Database administrator or Organizations: No    Attends Engineer, structural: Not on file    Marital Status: Divorced   Past Surgical History:  Procedure Laterality Date   BALLOON DILATION N/A 02/06/2021   Procedure: BALLOON DILATION;  Surgeon: Christia Reading, MD;  Location: [facility] OR;  Service: ENT;  Laterality: N/A;   BOWEL RESECTION  2006   COLONOSCOPY  03/2022    [facility]   MICROLARYNGOSCOPY WITH LASER AND BALLOON DILATION N/A 02/06/2021   Procedure: SUSPENDED MICRO DIRECT LARYNGOSCOPY;  Surgeon: Christia Reading, MD;  Location: [facility] OR;  Service: ENT;  Laterality: N/A;   PEG PLACEMENT N/A 09/03/2020   Procedure: PERCUTANEOUS ENDOSCOPIC GASTROSTOMY (PEG) PLACEMENT;  Surgeon: Violeta Gelinas, MD;  Location: [facility] OR;  Service: General;  Laterality: N/A;   PERCUTANEOUS TRACHEOSTOMY N/A 09/03/2020   Procedure: PERCUTANEOUS TRACHEOSTOMY USING 6 SHILEY;  Surgeon: Violeta Gelinas, MD;  Location: [facility] OR;  Service: General;  Laterality: N/A;   SKIN GRAFT     TONSILLECTOMY AND ADENOIDECTOMY     Past Surgical History:  Procedure Laterality Date   BALLOON DILATION N/A 02/06/2021   Procedure: BALLOON DILATION;  Surgeon: Christia Reading, MD;  Location: [facility] OR;  Service: ENT;  Laterality: N/A;   BOWEL RESECTION  2006   COLONOSCOPY  03/2022   [facility]   MICROLARYNGOSCOPY WITH LASER AND BALLOON DILATION N/A 02/06/2021   Procedure: SUSPENDED MICRO DIRECT LARYNGOSCOPY;  Surgeon: Christia Reading, MD;  Location: [facility] OR;  Service: ENT;  Laterality: N/A;   PEG PLACEMENT N/A 09/03/2020   Procedure: PERCUTANEOUS ENDOSCOPIC GASTROSTOMY (PEG) PLACEMENT;  Surgeon: Violeta Gelinas, MD;  Location: [facility] OR;  Service: General;  Laterality: N/A;   PERCUTANEOUS TRACHEOSTOMY N/A 09/03/2020   Procedure: PERCUTANEOUS TRACHEOSTOMY USING 6 SHILEY;  Surgeon: Violeta Gelinas, MD;  Location: [facility] OR;  Service: General;  Laterality: N/A;   SKIN GRAFT     TONSILLECTOMY AND ADENOIDECTOMY     Past Medical History:  Diagnosis Date   Anxiety 01/19/2020   Arthritis    Asthma    Benign prostatic hyperplasia with weak urinary stream 12/08/2019   BPH (benign prostatic hyperplasia) 08/17/2020   Chronic respiratory failure with hypoxia (HCC) 01/24/2020   Has home 02 but not using as of 01/24/2020  -  01/24/2020   Walked RA  approx   200 ft  @ slow pace  stopped due to  Dizzy with sats still 95%      Cigarette smoker  01/24/2020   Stopped regular smoking 10/2019 but still "now and then" as of 01/24/2020    COPD (chronic obstructive pulmonary disease) (HCC) 08/17/2020   gold   COPD GOLD ?     Quit smoking 10/2019  - Labs ordered 01/24/2020  :  allergy profile   alpha one AT phenotype   - 01/24/2020  After extensive coaching inhaler device,  effectiveness =    75% (short Ti) > change symb 80 to 160 2bid      Coronary artery disease 06/08/2018   DES to RCA, [facility] [place], [place])   Depression, major, single episode, moderate (HCC) 01/19/2020   Dyspnea    Encounter  for screening for malignant neoplasm of colon 12/08/2019   Essential hypertension 12/08/2019   History of MI (myocardial infarction) 01/19/2020   HTN (hypertension) 08/17/2020   Myocardial infarct (HCC) 2019   Pneumonia    Positive colorectal cancer screening using Cologuard test    BP 105/70   Pulse 83   Ht 5' 8.5" (1.74 m)   Wt 190 lb (86.2 kg)   SpO2 95% Comment: 3 L cont  BMI 28.47 kg/m   Opioid Risk Score:   Fall Risk Score:  `1  Depression screen PHQ 2/9     01/13/2023    1:24 PM 10/23/2022    1:07 PM 08/27/2022   11:14 AM 06/24/2022    1:02 PM 04/16/2022   10:55 AM 01/07/2022   10:13 AM 12/25/2021   12:52 PM  Depression screen PHQ 2/9  Decreased Interest 0 1 1 1  0 0 0  Down, Depressed, Hopeless 3 1 1 1  0 3 0  PHQ - 2 Score 3 2 2 2  0 3 0  Altered sleeping 1 0    0   Tired, decreased energy 3 1    0   Change in appetite 0 0    1   Feeling bad or failure about yourself  0 0    1   Trouble concentrating 0 0    0   Moving slowly or fidgety/restless 0 0    0   Suicidal thoughts 0 0    0   PHQ-9 Score 7 3    5    Difficult doing work/chores  Not difficult at all    Somewhat difficult      Review of Systems  Musculoskeletal:  Positive for back pain and neck pain.       LT hand  All other systems reviewed and are negative.      Objective:   Physical Exam Vitals and nursing note reviewed.  Constitutional:       Appearance: Normal appearance.  Cardiovascular:     Rate and Rhythm: Normal rate and regular rhythm.     Pulses: Normal pulses.     Heart sounds: Normal heart sounds.  Pulmonary:     Effort: Pulmonary effort is normal.     Breath sounds: Normal breath sounds.     Comments: Oxygen: 3 Liters : TC  Musculoskeletal:     Cervical back: Normal range of motion and neck supple.     Comments: Normal Muscle Bulk and Muscle Testing Reveals:  Upper Extremities: Full ROM and Muscle Strength 5/5 Bilateral AC Joint Tenderness Lumbar Paraspinal Tenderness: L-4-L-5 Lower Extremities: Full ROM and Muscle Strength 5/5 Arises from Table with ease Narrow Based  Gait     Skin:    General: Skin is warm and dry.  Neurological:     Mental Status: He is alert and oriented to person, place, and time.  Psychiatric:        Mood and Affect: Mood normal.        Behavior: Behavior normal.          Assessment & Plan:  1.Thoracic Spine Fracture:  Continue HEP as Tolerated. Continue to Monitor. 01/16/2023 2.Subglottic Stenosis/Tracheostomy: Stephannie Peters with 3 liters of Oxygen: Dr. Marene Lenz Following. Continue to Monitor. 01/16/2023 3. Urinary Retention due to BPH: Continue current medication regimen. PCP following. 01/16/2023 4. Chronic  Pain:Syndrome Refilled: Xtampza 13.5 mg every 12 hours# 60 and  and Oxycodone 5 mg 4 times a day as needed for pain #120. Second script  sent for the following month.  We will continue the opioid monitoring program, this consists of regular clinic visits, examinations, urine drug screen, pill counts as well as use of [place] Controlled Substance Reporting system. A 12 month History has been reviewed on the [place] Controlled Substance Reporting System on 01/16/2023 5. Slow Transit Constipation: Continue with Bowel Regimen. Continue to Monitor. 01/16/2023   F/U in 2 months

## 2023-01-22 ENCOUNTER — Ambulatory Visit: Payer: Medicaid Other | Admitting: Internal Medicine

## 2023-01-22 ENCOUNTER — Encounter: Payer: Self-pay | Admitting: Internal Medicine

## 2023-01-26 ENCOUNTER — Other Ambulatory Visit: Payer: Self-pay | Admitting: Internal Medicine

## 2023-01-26 ENCOUNTER — Other Ambulatory Visit: Payer: Self-pay | Admitting: Registered Nurse

## 2023-01-26 DIAGNOSIS — F411 Generalized anxiety disorder: Secondary | ICD-10-CM

## 2023-02-03 ENCOUNTER — Telehealth: Payer: Self-pay | Admitting: Registered Nurse

## 2023-02-03 MED ORDER — CLONAZEPAM 0.5 MG PO TABS: 0.5000 mg | ORAL_TABLET | Freq: Every day | ORAL | 2 refills | Status: DC

## 2023-02-03 NOTE — Telephone Encounter (Signed)
PMP was Reviewed.  Clonazepam e-scribed to pharmacy.

## 2023-02-09 ENCOUNTER — Other Ambulatory Visit: Payer: Self-pay | Admitting: Internal Medicine

## 2023-02-10 ENCOUNTER — Encounter (INDEPENDENT_AMBULATORY_CARE_PROVIDER_SITE_OTHER): Payer: Self-pay | Admitting: Gastroenterology

## 2023-02-10 ENCOUNTER — Other Ambulatory Visit (INDEPENDENT_AMBULATORY_CARE_PROVIDER_SITE_OTHER): Payer: Self-pay | Admitting: Gastroenterology

## 2023-02-10 DIAGNOSIS — K5903 Drug induced constipation: Secondary | ICD-10-CM

## 2023-02-17 ENCOUNTER — Other Ambulatory Visit: Payer: Self-pay | Admitting: Internal Medicine

## 2023-02-17 ENCOUNTER — Other Ambulatory Visit (HOSPITAL_COMMUNITY): Payer: Self-pay

## 2023-02-17 ENCOUNTER — Other Ambulatory Visit: Payer: Self-pay | Admitting: Nurse Practitioner

## 2023-02-17 DIAGNOSIS — F411 Generalized anxiety disorder: Secondary | ICD-10-CM

## 2023-02-17 DIAGNOSIS — J449 Chronic obstructive pulmonary disease, unspecified: Secondary | ICD-10-CM

## 2023-02-17 DIAGNOSIS — K219 Gastro-esophageal reflux disease without esophagitis: Secondary | ICD-10-CM

## 2023-02-17 DIAGNOSIS — F3341 Major depressive disorder, recurrent, in partial remission: Secondary | ICD-10-CM

## 2023-02-18 ENCOUNTER — Encounter: Payer: Self-pay | Admitting: Physical Medicine & Rehabilitation

## 2023-02-18 ENCOUNTER — Encounter: Payer: Medicaid Other | Attending: Physical Medicine & Rehabilitation | Admitting: Physical Medicine & Rehabilitation

## 2023-02-18 ENCOUNTER — Other Ambulatory Visit: Payer: Self-pay

## 2023-02-18 ENCOUNTER — Other Ambulatory Visit (HOSPITAL_COMMUNITY): Payer: Self-pay

## 2023-02-18 VITALS — BP 119/78 | HR 93 | Ht 68.0 in | Wt 189.0 lb

## 2023-02-18 DIAGNOSIS — K5903 Drug induced constipation: Secondary | ICD-10-CM

## 2023-02-18 DIAGNOSIS — Z5181 Encounter for therapeutic drug level monitoring: Secondary | ICD-10-CM

## 2023-02-18 DIAGNOSIS — S22000G Wedge compression fracture of unspecified thoracic vertebra, subsequent encounter for fracture with delayed healing: Secondary | ICD-10-CM

## 2023-02-18 DIAGNOSIS — S22008S Other fracture of unspecified thoracic vertebra, sequela: Secondary | ICD-10-CM | POA: Diagnosis present

## 2023-02-18 DIAGNOSIS — G894 Chronic pain syndrome: Secondary | ICD-10-CM

## 2023-02-18 DIAGNOSIS — Z79899 Other long term (current) drug therapy: Secondary | ICD-10-CM

## 2023-02-18 MED ORDER — OXYCODONE HCL 5 MG PO TABS
5.0000 mg | ORAL_TABLET | ORAL | 0 refills | Status: DC | PRN
Start: 2023-02-18 — End: 2023-04-20
  Filled 2023-02-18: qty 120, 30d supply, fill #0
  Filled 2023-02-23: qty 120, 20d supply, fill #0

## 2023-02-18 MED ORDER — BUPROPION HCL ER (XL) 300 MG PO TB24
300.0000 mg | ORAL_TABLET | Freq: Every day | ORAL | 5 refills | Status: DC
  Filled 2023-02-18 – 2023-02-19 (×2): qty 30, 30d supply, fill #0
  Filled 2023-03-18: qty 30, 30d supply, fill #1
  Filled 2023-04-17: qty 30, 30d supply, fill #2
  Filled 2023-05-18: qty 30, 30d supply, fill #3
  Filled 2023-06-11: qty 30, 30d supply, fill #4
  Filled 2023-07-11: qty 30, 30d supply, fill #5

## 2023-02-18 MED ORDER — TAMSULOSIN HCL 0.4 MG PO CAPS
0.4000 mg | ORAL_CAPSULE | Freq: Two times a day (BID) | ORAL | 0 refills | Status: DC
  Filled 2023-02-18 – 2023-02-19 (×2): qty 60, 30d supply, fill #0

## 2023-02-18 MED ORDER — XTAMPZA ER 13.5 MG PO C12A
1.0000 | EXTENDED_RELEASE_CAPSULE | Freq: Two times a day (BID) | ORAL | 0 refills | Status: DC
  Filled 2023-02-18: qty 60, 30d supply, fill #0

## 2023-02-18 MED ORDER — METOPROLOL SUCCINATE ER 25 MG PO TB24
12.5000 mg | ORAL_TABLET | Freq: Every day | ORAL | 3 refills | Status: DC
  Filled 2023-02-18 – 2023-03-09 (×4): qty 45, 90d supply, fill #0
  Filled 2023-06-01: qty 45, 90d supply, fill #1
  Filled 2023-08-29: qty 45, 90d supply, fill #2

## 2023-02-18 MED ORDER — BUSPIRONE HCL 10 MG PO TABS
10.0000 mg | ORAL_TABLET | Freq: Two times a day (BID) | ORAL | 0 refills | Status: DC
  Filled 2023-02-18 – 2023-02-19 (×2): qty 60, 30d supply, fill #0

## 2023-02-18 MED ORDER — OXYCODONE HCL 5 MG PO TABS
5.0000 mg | ORAL_TABLET | Freq: Four times a day (QID) | ORAL | 0 refills | Status: DC | PRN
Start: 2023-03-21 — End: 2023-03-31
  Filled 2023-02-18 – 2023-03-24 (×2): qty 120, 30d supply, fill #0

## 2023-02-18 MED ORDER — PANTOPRAZOLE SODIUM 40 MG PO TBEC
40.0000 mg | DELAYED_RELEASE_TABLET | Freq: Every day | ORAL | 1 refills | Status: DC
Start: 2023-02-18 — End: 2023-08-15
  Filled 2023-02-18 – 2023-02-19 (×2): qty 90, 90d supply, fill #0
  Filled 2023-05-18: qty 90, 90d supply, fill #1

## 2023-02-18 MED ORDER — MONTELUKAST SODIUM 10 MG PO TABS
10.0000 mg | ORAL_TABLET | Freq: Every day | ORAL | 3 refills | Status: DC
  Filled 2023-02-18 – 2023-02-19 (×2): qty 90, 90d supply, fill #0

## 2023-02-18 MED ORDER — FOLIC ACID 1 MG PO TABS
1.0000 mg | ORAL_TABLET | Freq: Every day | ORAL | 3 refills | Status: DC
Start: 2023-02-18 — End: 2023-03-31
  Filled 2023-02-18 – 2023-02-19 (×2): qty 90, 90d supply, fill #0

## 2023-02-18 MED ORDER — NITROGLYCERIN 0.4 MG SL SUBL
0.4000 mg | SUBLINGUAL_TABLET | SUBLINGUAL | 2 refills | Status: DC | PRN
  Filled 2023-02-18: qty 25, 1d supply, fill #0
  Filled 2023-02-19: qty 25, 8d supply, fill #0

## 2023-02-18 MED ORDER — BETHANECHOL CHLORIDE 25 MG PO TABS
50.0000 mg | ORAL_TABLET | Freq: Four times a day (QID) | ORAL | 0 refills | Status: DC
  Filled 2023-02-18 – 2023-02-19 (×2): qty 240, 30d supply, fill #0

## 2023-02-18 MED ORDER — QUETIAPINE FUMARATE 300 MG PO TABS
ORAL_TABLET | ORAL | 5 refills | Status: DC
  Filled 2023-03-02: qty 60, 30d supply, fill #0
  Filled 2023-03-25: qty 60, 30d supply, fill #1

## 2023-02-18 MED ORDER — XTAMPZA ER 13.5 MG PO C12A
13.5000 mg | EXTENDED_RELEASE_CAPSULE | Freq: Two times a day (BID) | ORAL | 0 refills | Status: DC
  Filled 2023-02-18 – 2023-03-02 (×4): qty 60, 30d supply, fill #0

## 2023-02-18 NOTE — Progress Notes (Signed)
Subjective:    Patient ID: Charles Weeks, male    DOB: Aug 11, 1965, 57 y.o.   MRN: [id number]  HPI [name] is here in follow up of his chronic pain. He reports his pain is about the same. He is still somewhat sedentary. He needs a lot of encouragement to do things. He may get out a little bit on the deck.  His family and caregivers regularly try to encourage him to move but he still ultimately does not do much.  He will go out to the kitchen and help himself in the kitchen as well at times.  Fortunately his pain has generally stable.  He has pain primarily in his mid and low back.  He remains on Xtampza and oxycodone for pain control.  From a pulmonary status there have been no changes.  He is on a trach and supplemental oxygen.  Bowels are regular with his regimen of MiraLAX in the morning and Colace and senna at nighttime.  He is emptying his bladder with fairly good flow.  Pain Inventory Average Pain 8 Pain Right Now 8 My pain is dull, tingling, and aching  In the last 24 hours, has pain interfered with the following? General activity 9 Relation with others 8 Enjoyment of life 9 What TIME of day is your pain at its worst? morning , daytime, evening, and night Sleep (in general) Poor  Pain is worse with: walking, bending, sitting, inactivity, standing, and some activites Pain improves with: heat/ice and medication Relief from Meds: 3  Family History  Problem Relation Age of Onset   Heart disease Mother    Heart disease Father    Diabetes Sister    Social History   Socioeconomic History   Marital status: Divorced    Spouse name: Not on file   Number of children: 2   Years of education: Not on file   Highest education level: 7th grade  Occupational History   Not on file  Tobacco Use   Smoking status: Former    Current packs/day: 0.00    Types: Cigarettes    Quit date: 08/17/2020    Years since quitting: 2.5   Smokeless tobacco: Never  Vaping Use   Vaping status:  Never Used  Substance and Sexual Activity   Alcohol use: Not Currently   Drug use: Not Currently   Sexual activity: Not on file  Other Topics Concern   Not on file  Social History Narrative   ** Merged History Encounter **       Lives with sister  Both daughters live close by   Sister has dog   Enjoys: shooting pool, fishing, Therapist, nutritional  Diet: eats all food groups Caffeine: pop-dr pepper 2 liter  Water: 4-6 cups   Wears seat belt  No driving for him Psychologist, sport and exercise  Fi   re Extinguisher No weapons        Social Determinants of Health   Financial Resource Strain: Low Risk  (10/23/2022)   Overall Financial Resource Strain (CARDIA)    Difficulty of Paying Living Expenses: Not very hard  Food Insecurity: No Food Insecurity (10/23/2022)   Hunger Vital Sign    Worried About Running Out of Food in the Last Year: Never true    Ran Out of Food in the Last Year: Never true  Transportation Needs: No Transportation Needs (10/23/2022)   PRAPARE - Administrator, Civil Service (Medical): No    Lack of Transportation (Non-Medical): No  Physical Activity: Unknown (  10/23/2022)   Exercise Vital Sign    Days of Exercise per Week: 0 days    Minutes of Exercise per Session: Not on file  Stress: No Stress Concern Present (10/23/2022)   [employer] of Occupational Health - Occupational Stress Questionnaire    Feeling of Stress : Only a little  Social Connections: Socially Isolated (10/23/2022)   Social Connection and Isolation Panel [NHANES]    Frequency of Communication with Friends and Family: Once a week    Frequency of Social Gatherings with Friends and Family: More than three times a week    Attends Religious Services: Never    Database administrator or Organizations: No    Attends Engineer, structural: Not on file    Marital Status: Divorced   Past Surgical History:  Procedure Laterality Date   BALLOON DILATION N/A 02/06/2021   Procedure: BALLOON DILATION;   Surgeon: Christia Reading, MD;  Location: [facility] OR;  Service: ENT;  Laterality: N/A;   BOWEL RESECTION  2006   COLONOSCOPY  03/2022   [facility]   MICROLARYNGOSCOPY WITH LASER AND BALLOON DILATION N/A 02/06/2021   Procedure: SUSPENDED MICRO DIRECT LARYNGOSCOPY;  Surgeon: Christia Reading, MD;  Location: [facility] OR;  Service: ENT;  Laterality: N/A;   PEG PLACEMENT N/A 09/03/2020   Procedure: PERCUTANEOUS ENDOSCOPIC GASTROSTOMY (PEG) PLACEMENT;  Surgeon: Violeta Gelinas, MD;  Location: [facility] OR;  Service: General;  Laterality: N/A;   PERCUTANEOUS TRACHEOSTOMY N/A 09/03/2020   Procedure: PERCUTANEOUS TRACHEOSTOMY USING 6 SHILEY;  Surgeon: Violeta Gelinas, MD;  Location: [facility] OR;  Service: General;  Laterality: N/A;   SKIN GRAFT     TONSILLECTOMY AND ADENOIDECTOMY     Past Surgical History:  Procedure Laterality Date   BALLOON DILATION N/A 02/06/2021   Procedure: BALLOON DILATION;  Surgeon: Christia Reading, MD;  Location: [facility] OR;  Service: ENT;  Laterality: N/A;   BOWEL RESECTION  2006   COLONOSCOPY  03/2022   [facility]   MICROLARYNGOSCOPY WITH LASER AND BALLOON DILATION N/A 02/06/2021   Procedure: SUSPENDED MICRO DIRECT LARYNGOSCOPY;  Surgeon: Christia Reading, MD;  Location: [facility] OR;  Service: ENT;  Laterality: N/A;   PEG PLACEMENT N/A 09/03/2020   Procedure: PERCUTANEOUS ENDOSCOPIC GASTROSTOMY (PEG) PLACEMENT;  Surgeon: Violeta Gelinas, MD;  Location: [facility] OR;  Service: General;  Laterality: N/A;   PERCUTANEOUS TRACHEOSTOMY N/A 09/03/2020   Procedure: PERCUTANEOUS TRACHEOSTOMY USING 6 SHILEY;  Surgeon: Violeta Gelinas, MD;  Location: [facility] OR;  Service: General;  Laterality: N/A;   SKIN GRAFT     TONSILLECTOMY AND ADENOIDECTOMY     Past Medical History:  Diagnosis Date   Anxiety 01/19/2020   Arthritis    Asthma    Benign prostatic hyperplasia with weak urinary stream 12/08/2019   BPH (benign prostatic hyperplasia) 08/17/2020   Chronic respiratory failure with hypoxia (HCC) 01/24/2020   Has home 02 but not using as of  01/24/2020  -  01/24/2020   Walked RA  approx   200 ft  @ slow pace  stopped due to  Dizzy with sats still 95%      Cigarette smoker 01/24/2020   Stopped regular smoking 10/2019 but still "now and then" as of 01/24/2020    COPD (chronic obstructive pulmonary disease) (HCC) 08/17/2020   gold   COPD GOLD ?     Quit smoking 10/2019  - Labs ordered 01/24/2020  :  allergy profile   alpha one AT phenotype   - 01/24/2020  After extensive coaching inhaler device,  effectiveness =  75% (short Ti) > change symb 80 to 160 2bid      Coronary artery disease 06/08/2018   DES to RCA, [facility] [place], [place])   Depression, major, single episode, moderate (HCC) 01/19/2020   Dyspnea    Encounter for screening for malignant neoplasm of colon 12/08/2019   Essential hypertension 12/08/2019   History of MI (myocardial infarction) 01/19/2020   HTN (hypertension) 08/17/2020   Myocardial infarct (HCC) 2019   Pneumonia    Positive colorectal cancer screening using Cologuard test    BP 119/78   Pulse 93   Ht 5\' 8"  (1.727 m)   Wt 189 lb (85.7 kg)   SpO2 93%   BMI 28.74 kg/m   Opioid Risk Score:   Fall Risk Score:  `1  Depression screen PHQ 2/9     02/18/2023    1:13 PM 01/16/2023    1:01 PM 01/13/2023    1:24 PM 10/23/2022    1:07 PM 08/27/2022   11:14 AM 06/24/2022    1:02 PM 04/16/2022   10:55 AM  Depression screen PHQ 2/9  Decreased Interest 0 2 0 1 1 1  0  Down, Depressed, Hopeless 0 2 3 1 1 1  0  PHQ - 2 Score 0 4 3 2 2 2  0  Altered sleeping   1 0     Tired, decreased energy   3 1     Change in appetite   0 0     Feeling bad or failure about yourself    0 0     Trouble concentrating   0 0     Moving slowly or fidgety/restless   0 0     Suicidal thoughts   0 0     PHQ-9 Score   7 3     Difficult doing work/chores    Not difficult at all         Review of Systems  Musculoskeletal:  Positive for back pain and neck pain.       R. Shoulder pain L. Wrist pain R. Hand pain  All other systems  reviewed and are negative.     Objective:   Physical Exam  General: No acute distress HEENT: NCAT, EOMI, oral membranes moist Cards: reg rate  Chest: normal effort Abdomen: Soft, NT, ND Skin: dry, intact Extremities: no edema Psych: pleasant and appropriate  Skin: intact Neuro: Neurologically appears to be alert and oriented x3.  Strength is grossly 5-/5 out of 5 in all 4 limbs with no focal sensory findings. pt ambulates well. Minimal sob for short dx .   Musculoskeletal: Thoracic spine pain with palpation/movement.  Ongoing dextroscoliosis with rotation of the spine as well.  seated comfortably in chair. Left wrist sl swollen, tender at carpals as well as carpal-metacarpals. Watch is tight on wrist.    Assessment & Plan:  1.  Debility secondary to fall 08/17/20 with 3 column T8 vertebral body fracture/T7 neural arch fracture/right TVP T4-8/right C7 transverse process fracture through the foramen.   progressive disease on recent mri              -maintain HEP             -remains very sedentary. Have spoken ad nauseum about his need to increase activity for pain, cardio-pulmonary  benefits, etc 2.  . Pain Management:  -Continue Xtampza 13.5 mg every 12 hours Continue Oxycodone 5mg  q6 prn, #120 to allow him 4 per day.   We will continue  the controlled substance monitoring program, this consists of regular clinic visits, examinations, routine drug screening, pill counts as well as use of [place] Controlled Substance Reporting System. [place].                    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                          -voltaren, elevation, ice                          4. Mood: Continue Wellbutrin 300 mg daily, BuSpar 10 mg down to 2 times daily, Klonopin 0.5 mg nightly,              -continue klonopin for now.              -Seroquel 150 mg twice daily and 300 mg nightly              -mood positive    5.  CAD/MI/DES.  Continue aspirin per cardiology.    6.  Acute hypercarbic ventilatory dependent respiratory failure/COPD.  Tracheostomy 09/03/2020 per Dr. Janee Morn.   -ENT/pumonary  following.      -phonating better!              -  7.  BPH/urine retention:              -oob to void for all attempts              -Continue hytrn, Flomax and Urecholine. voiding well with current regimen 8 Constipation---              Discussed his bowel regimen at length.             -Maintain softeners and laxatives.  Continue MiraLAX daily to daily as needed.             -senna and colace at bedtime.-- - this regimen is working for him   20  minutes of face to face patient care time were spent during this visit. All questions were encouraged and answered. Follow up with in 2 months with nurse practitioner

## 2023-02-18 NOTE — Patient Instructions (Addendum)
ALWAYS FEEL FREE TO CALL OUR OFFICE WITH ANY PROBLEMS OR QUESTIONS [phone])  **PLEASE NOTE** ALL MEDICATION REFILL REQUESTS (INCLUDING CONTROLLED SUBSTANCES) NEED TO BE MADE AT LEAST 7 DAYS PRIOR TO REFILL BEING DUE. ANY REFILL REQUESTS INSIDE THAT TIME FRAME MAY RESULT IN DELAYS IN RECEIVING YOUR PRESCRIPTION.    INCREASE YOUR ACTIVITY 25%!!!!!!

## 2023-02-19 ENCOUNTER — Other Ambulatory Visit (HOSPITAL_COMMUNITY): Payer: Self-pay

## 2023-02-19 ENCOUNTER — Other Ambulatory Visit: Payer: Self-pay

## 2023-02-19 MED ORDER — CLONAZEPAM 0.5 MG PO TABS
0.5000 mg | ORAL_TABLET | Freq: Every day | ORAL | 2 refills | Status: DC
  Filled 2023-03-10: qty 30, 30d supply, fill #0
  Filled 2023-04-08: qty 30, 30d supply, fill #1

## 2023-02-19 MED ORDER — ALBUTEROL SULFATE (2.5 MG/3ML) 0.083% IN NEBU
3.0000 mL | INHALATION_SOLUTION | Freq: Four times a day (QID) | RESPIRATORY_TRACT | 12 refills | Status: DC | PRN
  Filled 2023-02-19: qty 90, 8d supply, fill #0
  Filled 2023-09-09: qty 90, 8d supply, fill #1
  Filled 2023-09-29: qty 90, 8d supply, fill #2
  Filled 2023-10-06: qty 90, 8d supply, fill #3
  Filled 2023-10-27: qty 90, 8d supply, fill #4

## 2023-02-19 MED ORDER — TERAZOSIN HCL 5 MG PO CAPS
5.0000 mg | ORAL_CAPSULE | Freq: Every day | ORAL | 2 refills | Status: DC
  Filled 2023-02-19: qty 30, 30d supply, fill #0

## 2023-02-20 ENCOUNTER — Other Ambulatory Visit: Payer: Self-pay

## 2023-02-20 ENCOUNTER — Other Ambulatory Visit (HOSPITAL_COMMUNITY): Payer: Self-pay

## 2023-02-23 ENCOUNTER — Other Ambulatory Visit: Payer: Self-pay

## 2023-02-23 ENCOUNTER — Other Ambulatory Visit (HOSPITAL_COMMUNITY): Payer: Self-pay

## 2023-02-23 ENCOUNTER — Telehealth: Payer: Self-pay | Admitting: Internal Medicine

## 2023-02-23 ENCOUNTER — Other Ambulatory Visit: Payer: Self-pay | Admitting: Internal Medicine

## 2023-02-23 DIAGNOSIS — J449 Chronic obstructive pulmonary disease, unspecified: Secondary | ICD-10-CM

## 2023-02-23 MED ORDER — VENTOLIN HFA 108 (90 BASE) MCG/ACT IN AERS
1.0000 | INHALATION_SPRAY | RESPIRATORY_TRACT | 1 refills | Status: DC | PRN
  Filled 2023-02-23: qty 18, 17d supply, fill #0
  Filled 2023-04-22: qty 18, 17d supply, fill #1

## 2023-02-23 NOTE — Telephone Encounter (Signed)
[place] sending fax for prior authorization for [employer] phone number is [phone].

## 2023-02-24 ENCOUNTER — Other Ambulatory Visit: Payer: Self-pay

## 2023-02-24 ENCOUNTER — Other Ambulatory Visit (HOSPITAL_COMMUNITY): Payer: Self-pay

## 2023-02-27 NOTE — Telephone Encounter (Signed)
Okay; thanks.

## 2023-03-02 ENCOUNTER — Other Ambulatory Visit (HOSPITAL_COMMUNITY): Payer: Self-pay

## 2023-03-02 ENCOUNTER — Other Ambulatory Visit: Payer: Self-pay

## 2023-03-03 ENCOUNTER — Telehealth: Payer: Self-pay | Admitting: Internal Medicine

## 2023-03-03 ENCOUNTER — Other Ambulatory Visit: Payer: Self-pay

## 2023-03-03 NOTE — Telephone Encounter (Signed)
Charles Weeks at [employer] is inquiring about a prior authorization that was faxed to our office for Formoterol 108mcg/2ml.  She is wanting to make sure it was received and is being processed  ---Phone: [phone]

## 2023-03-04 ENCOUNTER — Other Ambulatory Visit: Payer: Self-pay

## 2023-03-09 ENCOUNTER — Other Ambulatory Visit: Payer: Self-pay

## 2023-03-09 ENCOUNTER — Telehealth: Payer: Self-pay

## 2023-03-09 ENCOUNTER — Other Ambulatory Visit (HOSPITAL_COMMUNITY): Payer: Self-pay

## 2023-03-09 NOTE — Telephone Encounter (Signed)
PA request has been Approved. New Encounter created for follow up. For additional info see Pharmacy Prior Auth telephone encounter from 10/21.

## 2023-03-09 NOTE — Telephone Encounter (Signed)
*  Pulm  Pharmacy Patient Advocate Encounter  Received notification from [facility] MEDICAID that Prior Authorization for Formoterol Neb has been APPROVED from 03/09/2023 to 03/08/2024   PA #/Case ID/Reference #: [id number] W

## 2023-03-10 ENCOUNTER — Other Ambulatory Visit (HOSPITAL_COMMUNITY): Payer: Self-pay

## 2023-03-16 ENCOUNTER — Other Ambulatory Visit (HOSPITAL_COMMUNITY): Payer: Self-pay

## 2023-03-16 ENCOUNTER — Other Ambulatory Visit: Payer: Self-pay

## 2023-03-16 ENCOUNTER — Other Ambulatory Visit: Payer: Self-pay | Admitting: Internal Medicine

## 2023-03-16 MED FILL — Terazosin HCl Cap 5 MG (Base Equivalent): ORAL | 30 days supply | Qty: 30 | Fill #0 | Status: AC

## 2023-03-17 ENCOUNTER — Other Ambulatory Visit (HOSPITAL_COMMUNITY): Payer: Self-pay

## 2023-03-17 ENCOUNTER — Other Ambulatory Visit (INDEPENDENT_AMBULATORY_CARE_PROVIDER_SITE_OTHER): Payer: Self-pay | Admitting: Gastroenterology

## 2023-03-17 ENCOUNTER — Encounter (INDEPENDENT_AMBULATORY_CARE_PROVIDER_SITE_OTHER): Payer: Self-pay | Admitting: Gastroenterology

## 2023-03-17 ENCOUNTER — Encounter: Payer: Self-pay | Admitting: Internal Medicine

## 2023-03-17 DIAGNOSIS — K5903 Drug induced constipation: Secondary | ICD-10-CM

## 2023-03-17 MED ORDER — POLYETHYLENE GLYCOL 3350 17 GM/SCOOP PO POWD
1.0000 | Freq: Every day | ORAL | 5 refills | Status: DC
  Filled 2023-03-17: qty 952, 3d supply, fill #0
  Filled 2023-04-17: qty 952, 3d supply, fill #1
  Filled 2023-04-18: qty 238, 14d supply, fill #2
  Filled 2023-04-18: qty 714, 2d supply, fill #2
  Filled 2023-05-12 – 2023-05-14 (×2): qty 238, 14d supply, fill #3
  Filled 2023-05-22 – 2023-05-28 (×2): qty 238, 14d supply, fill #4
  Filled 2023-06-22: qty 238, 14d supply, fill #5
  Filled 2023-06-29 – 2023-07-09 (×2): qty 238, 14d supply, fill #6

## 2023-03-18 ENCOUNTER — Other Ambulatory Visit: Payer: Self-pay

## 2023-03-18 ENCOUNTER — Other Ambulatory Visit (HOSPITAL_COMMUNITY): Payer: Self-pay

## 2023-03-19 ENCOUNTER — Other Ambulatory Visit (HOSPITAL_COMMUNITY): Payer: Self-pay

## 2023-03-24 ENCOUNTER — Other Ambulatory Visit: Payer: Self-pay

## 2023-03-24 ENCOUNTER — Other Ambulatory Visit (HOSPITAL_COMMUNITY): Payer: Self-pay

## 2023-03-25 ENCOUNTER — Other Ambulatory Visit (HOSPITAL_COMMUNITY): Payer: Self-pay

## 2023-03-29 ENCOUNTER — Other Ambulatory Visit: Payer: Self-pay | Admitting: Internal Medicine

## 2023-03-29 DIAGNOSIS — F411 Generalized anxiety disorder: Secondary | ICD-10-CM

## 2023-03-30 ENCOUNTER — Other Ambulatory Visit: Payer: Self-pay

## 2023-03-30 MED ORDER — TAMSULOSIN HCL 0.4 MG PO CAPS
0.4000 mg | ORAL_CAPSULE | Freq: Two times a day (BID) | ORAL | 0 refills | Status: DC
  Filled 2023-03-30: qty 60, 30d supply, fill #0

## 2023-03-30 MED ORDER — BUSPIRONE HCL 10 MG PO TABS
10.0000 mg | ORAL_TABLET | Freq: Two times a day (BID) | ORAL | 0 refills | Status: DC
  Filled 2023-03-30: qty 60, 30d supply, fill #0

## 2023-03-31 ENCOUNTER — Other Ambulatory Visit (HOSPITAL_COMMUNITY): Payer: Self-pay

## 2023-03-31 ENCOUNTER — Encounter: Payer: Self-pay | Admitting: Internal Medicine

## 2023-03-31 ENCOUNTER — Ambulatory Visit (INDEPENDENT_AMBULATORY_CARE_PROVIDER_SITE_OTHER): Payer: Medicaid Other | Admitting: Internal Medicine

## 2023-03-31 VITALS — BP 119/74 | HR 88 | Ht 68.0 in | Wt 189.0 lb

## 2023-03-31 DIAGNOSIS — J9611 Chronic respiratory failure with hypoxia: Secondary | ICD-10-CM | POA: Diagnosis not present

## 2023-03-31 DIAGNOSIS — J449 Chronic obstructive pulmonary disease, unspecified: Secondary | ICD-10-CM

## 2023-03-31 DIAGNOSIS — Z87891 Personal history of nicotine dependence: Secondary | ICD-10-CM | POA: Diagnosis not present

## 2023-03-31 NOTE — Assessment & Plan Note (Signed)
Referred for LCS 03/31/2023

## 2023-03-31 NOTE — Patient Instructions (Signed)
Make sure you check your oxygen saturation  AT  your highest level of activity (not after you stop)   to be sure it stays over 90% and adjust  02 flow upward to maintain this level if needed but remember to turn it back to previous settings when you stop (to conserve your supply).   My office will be contacting you by phone for referral  to the  lung cancer screening program   - if you don't hear back from my office within one week please call us back or notify us thru MyChart and we'll address it right away.   Please schedule a follow up visit in 6 months but call sooner if needed

## 2023-03-31 NOTE — Progress Notes (Unsigned)
Charles Weeks, male    DOB: August 12, 1965,    MRN: [id number]   Brief patient profile:  25 yowm illiterate though says finished 7th grade with asthma since in his 54s  just MM/quit smoking 08/2020 on disability since last PFT's ? 2017/18 relocated to [facility] summer 2021 and referred to pulmonary clinic in [place]  01/24/2020 by [name].  Admit date: 11/04/2019 Discharge date: 11/10/2019   Admitted From: Home Disposition: Home       Home Health: Equipment/Devices: Home oxygen at 3 L, nebulizer machine   Discharge Condition: Stable CODE STATUS: Full code Diet recommendation: Heart healthy   Brief/Interim Summary: Charles Weeks is a 57 y.o. male with a history of CAD, MI, COPD.  Patient just recently moved to [place] from [place].  He has no medications and has not seen a physician here.  He started having shortness of breath about 3 weeks ago which has been increasing.  He has been having cough with some purulent sputum production.  He had a relative give him some steroids over the past few days, without too much improvement.  No other palliating or provoking factors.  Denies fevers, chills, nausea, vomiting.   Emergency Department Course: Patient started on BiPAP and gradually weaned to nasal cannula.  Initial saturations were in the 80s.  Improved with steroids and BiPAP.  Remained stable on nasal cannula.   Discharge Diagnoses:  Principal Problem:   Acute respiratory failure with hypoxia (HCC) Active Problems:   Coronary artery disease   COPD with acute exacerbation (HCC)   Acute on chronic hypoxic respiratory failure secondary to acute COPD exacerbation Presented with hypoxia with O2 saturation in the 80s initially requiring NIV then transitioned to 3-4 L  -previously on 3L at home but has not used since Oct 2020 -Continue brovana and pulmicort -Continue hypertonic saline nebs -continue duonebs -He was treated with IV steroids which have since been  transitioned to prednisone taper -Personally reviewed chest x-ray which showed no lobular pulmonary infiltrates to suggest pneumonia -Maintain O2 saturation greater than 88 to 90% -TOC consulted to assist with PCP and pulmonology referrals -Currently, he can ambulate comfortably on 3 L of oxygen which appears to be near his baseline   -Acute COPD exacerbation Ongoing tobacco use; tobacco cessation counseling at bedside. COVID-19 negative Treated with IV steroids which was subsequently transitioned to prednisone taper Continue management as stated above -Continue Symbicort on discharge -Continue albuterol nebs as needed, provided with nebulizer machine   Hyperglycemia/impaired glucose tolerance Presented with elevated serum glucose Hemoglobin A1c 6.3 on 619 1221 Monitored on sliding scale insulin   Coronary artery disease status post PCI/history of MI Denies any anginal symptoms at the time of this exam Continue Brilinta  Not on statin, obtain lipid panel   Tobacco use disorder Tobacco cessation counseling Nicotine patch   BPH Continue terazosin Monitor urine output    History of Present Illness  01/24/2020  Pulmonary/ 1st office eval/ Latisha Lasch / [place] [facility]  Chief Complaint  Patient presents with   Pulmonary Consult    Referred by Tereasa Coop, NP.  Pt states hx of COPD. Pt c/o increased SOB since Winter 2020. He sometimes gets winded walking room to room at home. He is using albuterol inhaler and neb both 1-2 x per day.   Dyspnea:  [employer] walking / walk from [facility] space (sisters) / no steps but there are times he can't get across the room s sob  Cough: better since quit  Sleep: flat bed/ 1 pillow  - no resp  SABA use:neb  once or twice daily "can't make it thru the day" and also using 2 different forms of hfa  02  Prn  rec Plan A = Automatic = Always=    Symbicort 160 Take 2 puffs first thing in am and then another 2 puffs about 12 hours later.  Work on inhaler  technique: Plan B = Backup (to supplement plan A, not to replace it) Only use your albuterol inhaler as a rescue medication Plan C = Crisis (instead of Plan B but only if Plan B stops working) - only use your albuterol nebulizer if you first try Plan B and it fails to help  Make sure you check your oxygen saturations at highest level of activity  Please schedule a follow up visit in 3 months with pfts on return      05/31/2020  f/u ov/Charles Weeks office/Charles Weeks re: needs to go to lab / did stop smoking Chief Complaint  Patient presents with   Follow-up    Patient feels like breathing is the same since last visit. Cough but states that it is hard to get up mucus.   Dyspnea: basically housebound/ walks dog to porch / 25 ft bd Cough: non productive, last cig jun 2021 much better since then  Sleeping: flat bed/ no bipap/ wakes up feeling find SABA use: rarely neb,  Twice a daily proair never pre challenges or rechallenges  02: has it not using / not tracking sats with ex  Rec Plan A = Automatic = Always=    Symbicort 160 Take 2 puffs first thing in am and then another 2 puffs about 12 hours later.  Work on inhaler technique:     Plan B = Backup (to supplement plan A, not to replace it) Only use your albuterol inhaler as a rescue medication  Plan C = Crisis (instead of Plan B but only if Plan B stops working) - only use your albuterol nebulizer if you first try Plan B and it fails to help > ok to use the nebulizer up to every 4 hours but if start needing it regularly call for immediate appointment Make sure you check your oxygen saturations at highest level of activity to be sure it stays over 90%  Please remember to go to the lab and x-ray department at [facility]   for your tests - we will call you with the results when they are available. Please schedule a follow up visit in 3 months with pfts on return    Admitted August 17 2020 to [facility]  p neck fx > [facility] > d/c home with trach  and PEG and f/u by Dr Jenne Pane who rec laser surgery on trach   12/24/2020  f/u ov/[place] office/Charles Weeks re: GOLD IV post hosp  Chief Complaint  Patient presents with   Follow-up    Patient states that he would rather have Symbicort over [facility] inhaler feels like it does not work as good. Patient was admitted on 10/09/20. Sister states he will need surgical clearance today for his ENT Dr. Jenne Pane  Dyspnea:  mb and back 100 ft flat s 02  Cough: clear phlegm  Sleeping: bed is flat / on back one pillow SABA use: neb twice daily  02: 4lpm  Covid status: 2 vax  Rec Ok to change dulera to symbicort 160 Take 2 puffs first thing in am and then another 2 puffs about 12 hours later.  You are cleared for surgery > Jenne Pane  02/06/21 balloon dilatation/ no laser       03/12/2022  f/u ov/Charles Weeks office/Charles Weeks re: 02 dep/ trach dep  maint on trach collar 3lpm   On formoterol bid but  no budesonide due to shortage  Chief Complaint  Patient presents with   Follow-up    Breathing is doing some days, not so good other days.  Pts sister has picture of mucus coughed up to show Dr. Sherene Sires today   Dyspnea:  50 ft / spends a lot of time sitting on side of bed - 4-5 lpm with ex to keep > 90%  Cough: sometimes quite thick brown sev days prior to OV not consistent   Sleeping: hosp bed at 30 degrees  SABA use: occ hfa  02: 3lpm  Rec If breathing worse > Prednisone 10 mg take  4 each am x 2 days,   2 each am x 2 days,  1 each am x 2 days and stop  Maximum mucinex 1200 mg twice daily and check with 02 company for a different humidity if available and also check with Dr Jenne Pane if this continues to be a problem   09/09/2022  f/u ov/[place] office/Charles Weeks re:  trach dep/ AB budesonide/performist   Chief Complaint  Patient presents with   Follow-up    Pt f/u states that he is doing ok, currently using 1L O2 and sats are above 90%. He does increase O2 to 3L w/ movement. No questions or concerns  Dyspnea:  bathroom and  back  Cough: varies sometimes dark  Sleeping: bed is flat/ one pillow  SABA use: once daily  02: 3lpm  Rec No change in medications/ all trach care per Dr Jenne Pane Please schedule a follow up visit in 6  months but call sooner if needed     12/26/2022  Acute  ov/Charles Weeks office/Charles Weeks re: trach dep/AB  maint on performist/ bud   Chief Complaint  Patient presents with   Acute Visit    Wheezing/shob  Dyspnea:  worse x10 days / not following AVS instructions/ Action plan  Cough: mucus turned dark green  a few days prior to OV   Sleeping: flat bed with one pillow does fine  SABA use: using in place of bud/ performist  02: 3lpm not titrating as rec  Rec Plan A = Automatic = Always=  Performist (formoterol) 20 and budesonide 0.25 mg 1st thing and 12 hours later  Plan B = Backup (to supplement plan A, not to replace it) Only use your albuterol inhaler as a rescue medication  Plan C = Crisis (instead of Plan B but only if Plan B stops working) - only use your albuterol nebulizer if you first try Plan B Zpak  Make sure you check your oxygen saturation  AT  your highest level of activity (not after you stop)   to be sure it stays over 90%  Please schedule a follow up visit in 3 months but call sooner if needed  with all medications /inhalers/ solutions in hand     03/31/2023  f/u ov/[place] office/Charles Weeks re: trach dep/ AB /  maint on  performist /budesonide did bring meds  Chief Complaint  Patient presents with   COPD   Cough  Dyspnea:  very little activity - 100 ft to mb/ flat  Cough: none  Sleeping: flat bed / one pilow s  resp cc  SABA use: rarely  02: 2- 3lpm   Lung cancer screening:  referred today    No obvious day to day or daytime variability or assoc excess/ purulent sputum or mucus plugs or hemoptysis or cp or chest tightness, subjective wheeze or overt sinus or hb symptoms.    Also denies any obvious fluctuation of symptoms with weather or environmental changes or other  aggravating or alleviating factors except as outlined above   No unusual exposure hx or h/o childhood pna/ asthma or knowledge of premature birth.  Current Allergies, Complete Past Medical History, Past Surgical History, Family History, and Social History were reviewed in [employer] record.  ROS  The following are not active complaints unless bolded Hoarseness, sore throat, dysphagia, dental problems, itching, sneezing,  nasal congestion or discharge of excess mucus or purulent secretions, ear ache,   fever, chills, sweats, unintended wt loss or wt gain, classically pleuritic or exertional cp,  orthopnea pnd or arm/hand swelling  or leg swelling, presyncope, palpitations, abdominal pain, anorexia, nausea, vomiting, diarrhea  or change in bowel habits or change in bladder habits, change in stools or change in urine, dysuria, hematuria,  rash, arthralgias, visual complaints, headache, numbness, weakness or ataxia or problems with walking or coordination,  change in mood or  memory.        Current Meds  Medication Sig   acetaminophen (TYLENOL) 325 MG tablet Take 2 tablets (650 mg total) by mouth every 6 (six) hours as needed for mild pain or moderate pain.   albuterol (PROVENTIL) (2.5 MG/3ML) 0.083% nebulizer solution Inhale 1 vial (3 mLs) into the lungs via nebulizer every 6 (six) hours as needed for wheezing or shortness of breath   aspirin 81 MG chewable tablet Chew 1 tablet (81 mg total) by mouth daily.   bethanechol (URECHOLINE) 25 MG tablet Take 2 tablets (50 mg total) by mouth 4 (four) times daily.   budesonide (PULMICORT) 0.25 MG/2ML nebulizer solution Generic for Pulmicort. Inhale 1 vial via nebulizer twice daily with Perforomist.   buPROPion (WELLBUTRIN XL) 300 MG 24 hr tablet Take 1 tablet (300 mg total) by mouth daily.   busPIRone (BUSPAR) 10 MG tablet Take 1 tablet (10 mg total) by mouth 2 (two) times daily.   Cholecalciferol (VITAMIN D3) 125 MCG (5000 UT) CAPS  Take 5,000 Units by mouth daily.   clonazePAM (KLONOPIN) 0.5 MG tablet Take 1 tablet (0.5 mg total) by mouth at bedtime.   Docusate Sodium (STOOL SOFTENER LAXATIVE PO) Take 200 mg by mouth daily. 2-4 tablets   formoterol (PERFOROMIST) 20 MCG/2ML nebulizer solution Take 2 mLs (20 mcg total) by nebulization 2 (two) times daily.   metoCLOPramide (REGLAN) 5 MG tablet Take 1 tablet (5 mg total) by mouth every 6 (six) hours as needed for nausea.   metoprolol succinate (TOPROL-XL) 25 MG 24 hr tablet Take 0.5 tablets (12.5 mg total) by mouth daily.   Misc. Devices (TRANSPORT CHAIR) MISC For transporting to and from Doctors appointments   montelukast (SINGULAIR) 10 MG tablet Take 1 tablet (10 mg total) by mouth at bedtime.   nitroGLYCERIN (NITROSTAT) 0.4 MG SL tablet Place 1 tablet (0.4 mg total) under the tongue every 5 (five) minutes as needed for chest pain.   nystatin ointment (MYCOSTATIN) APPLY AROUND TRACH SITE TWICE DAILY FOR 2 WEEKS. MIX WITH TRIAMCINOLONE.   ondansetron (ZOFRAN) 4 MG tablet TAKE (1) TABLET BY MOUTH EVERY EIGHT HOURS AS NEEDED.   oxyCODONE (ROXICODONE) 5 MG immediate release tablet Take 1 tablet (5 mg total) by mouth every 4 (four) hours as needed for  severe pain.   oxyCODONE ER (XTAMPZA ER) 13.5 MG C12A Take 13.5 mg by mouth every 12 (twelve) hours.   OXYGEN Inhale 3 L into the lungs continuous.   pantoprazole (PROTONIX) 40 MG tablet Take 1 tablet (40 mg total) by mouth daily.   polyethylene glycol powder (GOODSENSE CLEARLAX) 17 GM/SCOOP powder Take 255 g by mouth daily.   QUEtiapine (SEROQUEL) 300 MG tablet Take 0.5 tablets (150 mg total) by mouth 2 (two) times daily AND 1 tablet (300 mg total) at bedtime.   rosuvastatin (CRESTOR) 5 MG tablet TAKE ONE TABLET BY MOUTH ONCE DAILY   senna (SENOKOT) 8.6 MG tablet Take 1 tablet by mouth at bedtime.   tamsulosin (FLOMAX) 0.4 MG CAPS capsule Take 1 capsule (0.4 mg total) by mouth 2 (two) times daily.   terazosin (HYTRIN) 5 MG capsule  Take 1 capsule (5 mg total) by mouth daily.   triamcinolone ointment (KENALOG) 0.1 % APPLY AROUND TRACH SITE TWICE DAILY FOR 2 WEEKS. MIX WITH NYSTATIN.   VENTOLIN HFA 108 (90 Base) MCG/ACT inhaler Inhale 1-2 puffs into the lungs every 4 (four) hours as needed FOR WHEEZING OR SHORTNESS OF BREATH   XTAMPZA ER 13.5 MG C12A TAKE 1 CAPSULE BY MOUTH EVERY 12 HOURS.             Past Medical History:  Diagnosis Date   Acute respiratory failure with hypoxia (HCC) 11/04/2019   Anxiety    Arthritis    COPD (chronic obstructive pulmonary disease) (HCC)    COPD with acute exacerbation (HCC) 11/04/2019   Coronary artery disease    Depression    Emphysema of lung (HCC)    Hypertension    Myocardial infarct (HCC)    Oxygen deficiency       Objective:   Wts  03/31/2023    189  12/26/2022        189  09/09/2022      197  03/12/2022    203  12/18/2021        194  06/19/2021        182 12/24/2020        181  05/31/20 194 lb 12.8 oz (88.4 kg)  03/06/20 186 lb (84.4 kg)  01/27/20 186 lb (84.4 kg)   Vital signs reviewed  03/31/2023  - Note at rest 02 sats  93% on 2lpm cont    General appearance:    amb wm/ holds finger over trach to speak   HEENT : Oropharynx  clear/ poor dentition   > seeing dentitst tomorrow     NECK :  without  apparent JVD/ palpable Nodes/TM    LUNGS: no acc muscle use,  Min barrel  contour chest wall with bilateral  slightly decreased bs s audible wheeze and  without cough on insp or exp maneuvers and min  Hyperresonant  to  percussion bilaterally    CV:  RRR  no s3 or murmur or increase in P2, and no edema   ABD:  soft and nontender with pos end  insp Hoover's  in the supine position.  No bruits or organomegaly appreciated   MS:  Nl gait/ ext warm without deformities Or obvious joint restrictions  calf tenderness, cyanosis or clubbing     SKIN: warm and dry without lesions    NEURO:  alert, approp, nl sensorium with  no motor or cerebellar deficits apparent.         Assessment

## 2023-04-01 ENCOUNTER — Other Ambulatory Visit: Payer: Self-pay | Admitting: Physical Medicine & Rehabilitation

## 2023-04-01 ENCOUNTER — Encounter: Payer: Self-pay | Admitting: Internal Medicine

## 2023-04-01 ENCOUNTER — Encounter: Payer: Self-pay | Admitting: Physical Medicine & Rehabilitation

## 2023-04-01 ENCOUNTER — Other Ambulatory Visit (HOSPITAL_COMMUNITY): Payer: Self-pay

## 2023-04-01 DIAGNOSIS — S22008S Other fracture of unspecified thoracic vertebra, sequela: Secondary | ICD-10-CM

## 2023-04-01 DIAGNOSIS — S22000G Wedge compression fracture of unspecified thoracic vertebra, subsequent encounter for fracture with delayed healing: Secondary | ICD-10-CM

## 2023-04-01 DIAGNOSIS — G894 Chronic pain syndrome: Secondary | ICD-10-CM

## 2023-04-01 NOTE — Assessment & Plan Note (Signed)
Quit smoking 08/2020/ MM - 01/24/2020  After extensive coaching inhaler device,  effectiveness =    75% (short Ti) > change symb 80 to 160 2bid  -  PFT's   05/01/20   FEV1 0.97 (27 % ) ratio 0.30  p 1 % improvement from saba p symbicort prior to study with DLCO  11.59 (43%) corrects to 2.0 (45%)  for alv volume and FV curve severe airflow obst with 35% improvement in FVC p saba  - 05/31/2020  After extensive coaching inhaler device,  effectiveness =    Short Ti, prostate problems so rx symbicort 160 2bid and prn saba -  05/31/2020   Walked RA  approx   300 ft  @ moderate pace  stopped due to  Sob at end but sats still 94%    - Labs ordered 12/24/2020  :    alpha one AT phenotype  MM level 154   Well compensated on perfomist/bud bid s flares or need for saba > no change rx

## 2023-04-01 NOTE — Assessment & Plan Note (Signed)
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 Patient wanted to stop walk after 179ft due to SOB and being unsteady with lowest sats 94%  - 03/31/2023   Walked on 2lpm cont  x  2  lap(s) =  approx 300  ft  @ mod pace, stopped due to fatigue > sob  with lowest 02 sats 90%    Rec 2lpm should be fine x for more activity than he demonstated today  Advised: Make sure you check your oxygen saturation  AT  your highest level of activity (not after you stop)   to be sure it stays over 90% and adjust  02 flow upward to maintain this level if needed but remember to turn it back to previous settings when you stop (to conserve your supply).    Each maintenance medication was reviewed in detail including emphasizing most importantly the difference between maintenance and prns and under what circumstances the prns are to be triggered using an action plan format where appropriate.  Total time for H and P, chart review, counseling, reviewing neb/ 02 /pulse ox  device(s) , directly observing portions of ambulatory 02 saturation study/ and generating customized AVS unique to this office visit / same day charting = 30 min

## 2023-04-02 ENCOUNTER — Other Ambulatory Visit (HOSPITAL_COMMUNITY): Payer: Self-pay

## 2023-04-02 ENCOUNTER — Telehealth: Payer: Self-pay | Admitting: Registered Nurse

## 2023-04-02 ENCOUNTER — Other Ambulatory Visit: Payer: Self-pay

## 2023-04-02 DIAGNOSIS — G894 Chronic pain syndrome: Secondary | ICD-10-CM

## 2023-04-02 DIAGNOSIS — S22008S Other fracture of unspecified thoracic vertebra, sequela: Secondary | ICD-10-CM

## 2023-04-02 DIAGNOSIS — S22000G Wedge compression fracture of unspecified thoracic vertebra, subsequent encounter for fracture with delayed healing: Secondary | ICD-10-CM

## 2023-04-02 MED ORDER — XTAMPZA ER 13.5 MG PO C12A
13.5000 mg | EXTENDED_RELEASE_CAPSULE | Freq: Two times a day (BID) | ORAL | 0 refills | Status: DC
  Filled 2023-04-02: qty 60, 30d supply, fill #0

## 2023-04-02 NOTE — Telephone Encounter (Signed)
PMP was Reviewed.  Xtampza e-scribed to pharmacy.  Mr. Charles Weeks sister is aware of the above via My-Chart message

## 2023-04-03 ENCOUNTER — Other Ambulatory Visit (HOSPITAL_COMMUNITY): Payer: Self-pay

## 2023-04-08 ENCOUNTER — Other Ambulatory Visit (HOSPITAL_COMMUNITY): Payer: Self-pay

## 2023-04-08 ENCOUNTER — Other Ambulatory Visit: Payer: Self-pay

## 2023-04-09 ENCOUNTER — Other Ambulatory Visit (HOSPITAL_COMMUNITY): Payer: Self-pay

## 2023-04-09 ENCOUNTER — Other Ambulatory Visit: Payer: Self-pay

## 2023-04-09 ENCOUNTER — Other Ambulatory Visit: Payer: Self-pay | Admitting: Internal Medicine

## 2023-04-09 ENCOUNTER — Encounter: Payer: Self-pay | Admitting: Internal Medicine

## 2023-04-09 MED ORDER — BETHANECHOL CHLORIDE 25 MG PO TABS
50.0000 mg | ORAL_TABLET | Freq: Four times a day (QID) | ORAL | 0 refills | Status: DC
  Filled 2023-04-09: qty 240, 30d supply, fill #0

## 2023-04-10 ENCOUNTER — Other Ambulatory Visit (HOSPITAL_COMMUNITY): Payer: Self-pay

## 2023-04-10 ENCOUNTER — Other Ambulatory Visit: Payer: Self-pay

## 2023-04-14 ENCOUNTER — Other Ambulatory Visit: Payer: Self-pay

## 2023-04-14 ENCOUNTER — Other Ambulatory Visit (HOSPITAL_COMMUNITY): Payer: Self-pay

## 2023-04-14 ENCOUNTER — Other Ambulatory Visit: Payer: Self-pay | Admitting: Cardiology

## 2023-04-14 MED ORDER — ROSUVASTATIN CALCIUM 5 MG PO TABS
5.0000 mg | ORAL_TABLET | Freq: Every day | ORAL | 3 refills | Status: DC
  Filled 2023-04-14: qty 90, 90d supply, fill #0
  Filled 2023-07-09: qty 90, 90d supply, fill #1
  Filled 2023-10-06: qty 90, 90d supply, fill #2

## 2023-04-14 MED FILL — Terazosin HCl Cap 5 MG (Base Equivalent): ORAL | 30 days supply | Qty: 30 | Fill #1 | Status: AC

## 2023-04-17 ENCOUNTER — Other Ambulatory Visit: Payer: Self-pay

## 2023-04-17 ENCOUNTER — Other Ambulatory Visit (HOSPITAL_COMMUNITY): Payer: Self-pay

## 2023-04-18 ENCOUNTER — Other Ambulatory Visit (HOSPITAL_COMMUNITY): Payer: Self-pay

## 2023-04-20 ENCOUNTER — Other Ambulatory Visit: Payer: Self-pay | Admitting: Physical Medicine & Rehabilitation

## 2023-04-20 ENCOUNTER — Telehealth: Payer: Self-pay | Admitting: Registered Nurse

## 2023-04-20 ENCOUNTER — Encounter: Payer: Self-pay | Admitting: Physical Medicine & Rehabilitation

## 2023-04-20 ENCOUNTER — Other Ambulatory Visit: Payer: Self-pay

## 2023-04-20 ENCOUNTER — Other Ambulatory Visit (HOSPITAL_COMMUNITY): Payer: Self-pay

## 2023-04-20 DIAGNOSIS — G894 Chronic pain syndrome: Secondary | ICD-10-CM

## 2023-04-20 DIAGNOSIS — S22008S Other fracture of unspecified thoracic vertebra, sequela: Secondary | ICD-10-CM

## 2023-04-20 DIAGNOSIS — S22000G Wedge compression fracture of unspecified thoracic vertebra, subsequent encounter for fracture with delayed healing: Secondary | ICD-10-CM

## 2023-04-20 MED ORDER — OXYCODONE HCL 5 MG PO TABS
5.0000 mg | ORAL_TABLET | ORAL | 0 refills | Status: DC | PRN
  Filled 2023-04-20: qty 120, 20d supply, fill #0

## 2023-04-20 NOTE — Telephone Encounter (Signed)
Per pmp last fill   03/24/2023 02/18/2023 1  Oxycodone Hcl (Ir) 5 Mg Tablet 120.00 30 Za Swa [id number] Con (0126) 0/0 30.00 MME Other [place]

## 2023-04-20 NOTE — Telephone Encounter (Signed)
Oxycodone sent to [facility] pharmacy

## 2023-04-20 NOTE — Telephone Encounter (Signed)
PMP was Reviewed.  Oxycodone e-scribed to pharmacy. Mr. Klym sister [name] is aware via My- Chart message.

## 2023-04-21 ENCOUNTER — Other Ambulatory Visit (HOSPITAL_COMMUNITY): Payer: Self-pay

## 2023-04-21 ENCOUNTER — Ambulatory Visit (INDEPENDENT_AMBULATORY_CARE_PROVIDER_SITE_OTHER): Payer: Medicaid Other | Admitting: Internal Medicine

## 2023-04-21 VITALS — BP 110/62 | HR 107 | Ht 68.0 in | Wt 191.6 lb

## 2023-04-21 DIAGNOSIS — E1165 Type 2 diabetes mellitus with hyperglycemia: Secondary | ICD-10-CM

## 2023-04-21 DIAGNOSIS — J398 Other specified diseases of upper respiratory tract: Secondary | ICD-10-CM | POA: Diagnosis not present

## 2023-04-21 NOTE — Assessment & Plan Note (Signed)
Recent diagnosis, meeting criteria with A1c 6.5 on labs from June. -Repeat A1c ordered today -Ophthalmology referral placed for diabetic eye exam

## 2023-04-21 NOTE — Patient Instructions (Signed)
It was a pleasure to see you today.  Thank you for giving [place] the opportunity to be involved in your care.  Below is a brief recap of your visit and next steps.  We will plan to see you again in 3 months.  Summary No medication changes today Repeat A1c  Ophthalmology referral placed Follow up in 3 months

## 2023-04-21 NOTE — Progress Notes (Signed)
Established Patient Office Visit  Subjective   Patient ID: Charles Weeks, male    DOB: [birth year]  Age: 57 y.o. MRN: [id number]  Chief Complaint  Patient presents with   COPD    Follow up    Charles Weeks returns to care today for routine follow-up.  He was last evaluated by me on 8/27 no medication changes were made at that time and 72-month follow-up was arranged.  In the interim, he has been evaluated by PM&R and pulmonology for follow-up.  There have otherwise been no acute interval events. Charles Weeks reports feeling well today.  He is asymptomatic currently.  He reports that his otolaryngologist, Dr. Jenne Pane, is discussing laryngoscopy in the setting of tracheal stenosis but would like clearance from pulmonology and cardiology before any procedure.  Past Medical History:  Diagnosis Date   Anxiety 01/19/2020   Arthritis    Asthma    Benign prostatic hyperplasia with weak urinary stream 12/08/2019   BPH (benign prostatic hyperplasia) 08/17/2020   Chronic respiratory failure with hypoxia (HCC) 01/24/2020   Has home 02 but not using as of 01/24/2020  -  01/24/2020   Walked RA  approx   200 ft  @ slow pace  stopped due to  Dizzy with sats still 95%      Cigarette smoker 01/24/2020   Stopped regular smoking 10/2019 but still "now and then" as of 01/24/2020    COPD (chronic obstructive pulmonary disease) (HCC) 08/17/2020   gold   COPD GOLD ?     Quit smoking 10/2019  - Labs ordered 01/24/2020  :  allergy profile   alpha one AT phenotype   - 01/24/2020  After extensive coaching inhaler device,  effectiveness =    75% (short Ti) > change symb 80 to 160 2bid      Coronary artery disease 06/08/2018   DES to RCA, [facility] [place] [place])   Depression, major, single episode, moderate (HCC) 01/19/2020   Dyspnea    Encounter for screening for malignant neoplasm of colon 12/08/2019   Essential hypertension 12/08/2019   History of MI (myocardial infarction) 01/19/2020   HTN (hypertension)  08/17/2020   Myocardial infarct (HCC) 2019   Pneumonia    Positive colorectal cancer screening using Cologuard test    Past Surgical History:  Procedure Laterality Date   BALLOON DILATION N/A 02/06/2021   Procedure: BALLOON DILATION;  Surgeon: Christia Reading, MD;  Location: [facility] OR;  Service: ENT;  Laterality: N/A;   BOWEL RESECTION  2006   COLONOSCOPY  03/2022   [facility]   MICROLARYNGOSCOPY WITH LASER AND BALLOON DILATION N/A 02/06/2021   Procedure: SUSPENDED MICRO DIRECT LARYNGOSCOPY;  Surgeon: Christia Reading, MD;  Location: [facility] OR;  Service: ENT;  Laterality: N/A;   PEG PLACEMENT N/A 09/03/2020   Procedure: PERCUTANEOUS ENDOSCOPIC GASTROSTOMY (PEG) PLACEMENT;  Surgeon: Violeta Gelinas, MD;  Location: [facility] OR;  Service: General;  Laterality: N/A;   PERCUTANEOUS TRACHEOSTOMY N/A 09/03/2020   Procedure: PERCUTANEOUS TRACHEOSTOMY USING 6 SHILEY;  Surgeon: Violeta Gelinas, MD;  Location: [facility] OR;  Service: General;  Laterality: N/A;   SKIN GRAFT     TONSILLECTOMY AND ADENOIDECTOMY     Social History   Tobacco Use   Smoking status: Former    Current packs/day: 0.00    Types: Cigarettes    Quit date: 08/17/2020    Years since quitting: 2.6   Smokeless tobacco: Never  Vaping Use   Vaping status: Never Used  Substance Use Topics  Alcohol use: Not Currently   Drug use: Not Currently   Family History  Problem Relation Age of Onset   Heart disease Mother    Heart disease Father    Diabetes Sister    Allergies  Allergen Reactions   Ciprofloxacin Hives   Review of Systems  Constitutional:  Negative for chills and fever.  HENT:  Negative for sore throat.   Respiratory:  Negative for cough and shortness of breath.   Cardiovascular:  Negative for chest pain, palpitations and leg swelling.  Gastrointestinal:  Negative for abdominal pain, blood in stool, constipation, diarrhea, nausea and vomiting.  Genitourinary:  Negative for dysuria and hematuria.  Musculoskeletal:  Negative for myalgias.   Skin:  Negative for itching and rash.  Neurological:  Negative for dizziness and headaches.  Psychiatric/Behavioral:  Negative for depression and suicidal ideas.      Objective:     BP 110/62 (BP Location: Right Arm, Patient Position: Sitting, Cuff Size: Normal)   Pulse (!) 107   Ht 5\' 8"  (1.727 m)   Wt 191 lb 9.6 oz (86.9 kg)   SpO2 94%   BMI 29.13 kg/m  BP Readings from Last 3 Encounters:  04/21/23 110/62  03/31/23 119/74  02/18/23 119/78   Physical Exam Vitals reviewed.  Constitutional:      General: He is not in acute distress.    Appearance: Normal appearance. He is obese. He is not ill-appearing.  HENT:     Head: Normocephalic and atraumatic.     Right Ear: External ear normal.     Left Ear: External ear normal.     Nose: Nose normal. No congestion or rhinorrhea.     Mouth/Throat:     Mouth: Mucous membranes are moist.     Pharynx: Oropharynx is clear.  Eyes:     General: No scleral icterus.    Extraocular Movements: Extraocular movements intact.     Conjunctiva/sclera: Conjunctivae normal.     Pupils: Pupils are equal, round, and reactive to light.  Cardiovascular:     Rate and Rhythm: Normal rate and regular rhythm.     Pulses: Normal pulses.     Heart sounds: Normal heart sounds. No murmur heard. Pulmonary:     Effort: Pulmonary effort is normal.     Breath sounds: Normal breath sounds. No wheezing, rhonchi or rales.     Comments: Trach in place.  Examined on 3 L  Abdominal:     General: Abdomen is flat. Bowel sounds are normal. There is no distension.     Palpations: Abdomen is soft.     Tenderness: There is no abdominal tenderness.  Musculoskeletal:        General: No swelling or deformity. Normal range of motion.     Cervical back: Normal range of motion.  Skin:    General: Skin is warm and dry.     Capillary Refill: Capillary refill takes less than 2 seconds.  Neurological:     General: No focal deficit present.     Mental Status: He is alert  and oriented to person, place, and time.     Motor: No weakness.  Psychiatric:        Mood and Affect: Mood normal.        Behavior: Behavior normal.        Thought Content: Thought content normal.   Last CBC Lab Results  Component Value Date   WBC 7.1 10/23/2022   HGB 14.0 10/23/2022   HCT 42.6 10/23/2022  MCV 91 10/23/2022   MCH 29.8 10/23/2022   RDW 12.7 10/23/2022   PLT 214 10/23/2022   Last metabolic panel Lab Results  Component Value Date   GLUCOSE 131 (H) 10/23/2022   NA 143 10/23/2022   K 4.2 10/23/2022   CL 100 10/23/2022   CO2 28 10/23/2022   BUN 8 10/23/2022   CREATININE 0.91 10/23/2022   EGFR 98 10/23/2022   CALCIUM 9.6 10/23/2022   PHOS 4.2 11/28/2021   PROT 6.5 10/23/2022   ALBUMIN 4.4 10/23/2022   LABGLOB 2.1 10/23/2022   AGRATIO 2.1 10/23/2022   BILITOT 0.3 10/23/2022   ALKPHOS 117 10/23/2022   AST 19 10/23/2022   ALT 20 10/23/2022   ANIONGAP 4 (L) 11/28/2021   Last lipids Lab Results  Component Value Date   CHOL 137 03/05/2022   HDL 42 03/05/2022   LDLCALC 35 03/05/2022   TRIG 298 (H) 03/05/2022   CHOLHDL 3.3 03/05/2022   Last hemoglobin A1c Lab Results  Component Value Date   HGBA1C 6.5 (H) 10/23/2022   Last thyroid functions Lab Results  Component Value Date   TSH 2.590 10/23/2022   The 10-year ASCVD risk score (Arnett DK, et al., 2019) is: 9.7%    Assessment & Plan:   Problem List Items Addressed This Visit       Tracheal stenosis    Recently seen by ENT (Dr. Jenne Pane) for follow-up.  Mr. Zaccagnini would like to pursue decannulation.  They are discussing operative laryngoscopy in an effort to improve airway patency.  Cardiology and pulmonology clearance is necessary prior to any decannulation efforts.      Type 2 diabetes mellitus with hyperglycemia (HCC) - Primary    Recent diagnosis, meeting criteria with A1c 6.5 on labs from June. -Repeat A1c ordered today -Ophthalmology referral placed for diabetic eye exam        Return in about 3 months (around 07/20/2023).    Billie Lade, MD

## 2023-04-21 NOTE — Assessment & Plan Note (Signed)
Recently seen by ENT (Dr. Jenne Pane) for follow-up.  Mr. Charles Weeks would like to pursue decannulation.  They are discussing operative laryngoscopy in an effort to improve airway patency.  Cardiology and pulmonology clearance is necessary prior to any decannulation efforts.

## 2023-04-22 ENCOUNTER — Other Ambulatory Visit: Payer: Self-pay

## 2023-04-22 ENCOUNTER — Encounter: Payer: Medicaid Other | Attending: Physical Medicine & Rehabilitation | Admitting: Registered Nurse

## 2023-04-22 ENCOUNTER — Other Ambulatory Visit (HOSPITAL_COMMUNITY): Payer: Self-pay

## 2023-04-22 VITALS — BP 122/87 | HR 73 | Ht 68.0 in | Wt 190.0 lb

## 2023-04-22 DIAGNOSIS — M545 Low back pain, unspecified: Secondary | ICD-10-CM | POA: Insufficient documentation

## 2023-04-22 DIAGNOSIS — Z5181 Encounter for therapeutic drug level monitoring: Secondary | ICD-10-CM | POA: Insufficient documentation

## 2023-04-22 DIAGNOSIS — R338 Other retention of urine: Secondary | ICD-10-CM | POA: Diagnosis present

## 2023-04-22 DIAGNOSIS — S22000G Wedge compression fracture of unspecified thoracic vertebra, subsequent encounter for fracture with delayed healing: Secondary | ICD-10-CM

## 2023-04-22 DIAGNOSIS — N401 Enlarged prostate with lower urinary tract symptoms: Secondary | ICD-10-CM | POA: Diagnosis present

## 2023-04-22 DIAGNOSIS — G894 Chronic pain syndrome: Secondary | ICD-10-CM | POA: Diagnosis not present

## 2023-04-22 DIAGNOSIS — S22008S Other fracture of unspecified thoracic vertebra, sequela: Secondary | ICD-10-CM | POA: Insufficient documentation

## 2023-04-22 DIAGNOSIS — M546 Pain in thoracic spine: Secondary | ICD-10-CM | POA: Insufficient documentation

## 2023-04-22 DIAGNOSIS — Z79899 Other long term (current) drug therapy: Secondary | ICD-10-CM | POA: Insufficient documentation

## 2023-04-22 DIAGNOSIS — G8929 Other chronic pain: Secondary | ICD-10-CM | POA: Insufficient documentation

## 2023-04-22 LAB — BAYER DCA HB A1C WAIVED: HB A1C (BAYER DCA - WAIVED): 6 % — ABNORMAL HIGH (ref 4.8–5.6)

## 2023-04-22 MED ORDER — OXYCODONE HCL 5 MG PO TABS
5.0000 mg | ORAL_TABLET | ORAL | 0 refills | Status: DC | PRN
  Filled 2023-04-22 – 2023-05-18 (×2): qty 120, 20d supply, fill #0

## 2023-04-22 MED ORDER — XTAMPZA ER 13.5 MG PO C12A
13.5000 mg | EXTENDED_RELEASE_CAPSULE | Freq: Two times a day (BID) | ORAL | 0 refills | Status: DC
  Filled 2023-04-22: qty 60, 30d supply, fill #0
  Filled 2023-04-30: qty 20, 10d supply, fill #0
  Filled 2023-04-30: qty 40, 20d supply, fill #0

## 2023-04-22 NOTE — Progress Notes (Signed)
Subjective:    Patient ID: Charles Weeks, male    DOB: 1966-01-25, 57 y.o.   MRN: [id number]  HPI: Charles Weeks is a 57 y.o. male who returns for follow up appointment for chronic pain and medication refill. He states his pain is located in his lower back. He rates his pain 8. His current exercise regime is walking and performing stretching exercises.  Mr. Charles Weeks equivalent is 90.00 MME.He is also prescribed clonazepam . We have discussed the black box warning of using opioids and benzodiazepines. I highlighted the dangers of using these drugs together and discussed the adverse events including respiratory suppression, overdose, cognitive impairment and importance of compliance with current regimen. We will continue to monitor and adjust as indicated.   Care Giver in Room .     Pain Inventory Average Pain 9 Pain Right Now 8 My pain is dull, tingling, and aching  In the last 24 hours, has pain interfered with the following? General activity 9 Relation with others 8 Enjoyment of life 9 What TIME of day is your pain at its worst? morning , daytime, evening, and night Sleep (in general) Poor  Pain is worse with: walking, bending, sitting, standing, and some activites Pain improves with: heat/ice and medication Relief from Meds: 3  Family History  Problem Relation Age of Onset   Heart disease Mother    Heart disease Father    Diabetes Sister    Social History   Socioeconomic History   Marital status: Divorced    Spouse name: Not on file   Number of children: 2   Years of education: Not on file   Highest education level: 7th grade  Occupational History   Not on file  Tobacco Use   Smoking status: Former    Current packs/day: 0.00    Types: Cigarettes    Quit date: 08/17/2020    Years since quitting: 2.6   Smokeless tobacco: Never  Vaping Use   Vaping status: Never Used  Substance and Sexual Activity   Alcohol use: Not Currently   Drug use: Not Currently    Sexual activity: Not on file  Other Topics Concern   Not on file  Social History Narrative   ** Merged History Encounter **       Lives with sister  Both daughters live close by   Sister has dog   Enjoys: shooting pool, fishing, Therapist, nutritional  Diet: eats all food groups Caffeine: pop-dr pepper 2 liter  Water: 4-6 cups   Wears seat belt  No driving for him Psychologist, sport and exercise  Fi   re Extinguisher No weapons        Social Determinants of Health   Financial Resource Strain: Low Risk  (04/21/2023)   Overall Financial Resource Strain (CARDIA)    Difficulty of Paying Living Expenses: Not hard at all  Food Insecurity: No Food Insecurity (04/21/2023)   Hunger Vital Sign    Worried About Running Out of Food in the Last Year: Never true    Ran Out of Food in the Last Year: Never true  Transportation Needs: No Transportation Needs (04/21/2023)   PRAPARE - Administrator, Civil Service (Medical): No    Lack of Transportation (Non-Medical): No  Physical Activity: Unknown (04/21/2023)   Exercise Vital Sign    Days of Exercise per Week: 0 days    Minutes of Exercise per Session: Not on file  Stress: No Stress Concern Present (04/21/2023)   [employer] of Occupational  Health - Occupational Stress Questionnaire    Feeling of Stress : Only a little  Social Connections: Socially Isolated (04/21/2023)   Social Connection and Isolation Panel [NHANES]    Frequency of Communication with Friends and Family: More than three times a week    Frequency of Social Gatherings with Friends and Family: Once a week    Attends Religious Services: Never    Database administrator or Organizations: No    Attends Engineer, structural: Not on file    Marital Status: Divorced   Past Surgical History:  Procedure Laterality Date   BALLOON DILATION N/A 02/06/2021   Procedure: BALLOON DILATION;  Surgeon: Christia Reading, MD;  Location: [facility] OR;  Service: ENT;  Laterality: N/A;   BOWEL  RESECTION  2006   COLONOSCOPY  03/2022   [facility]   MICROLARYNGOSCOPY WITH LASER AND BALLOON DILATION N/A 02/06/2021   Procedure: SUSPENDED MICRO DIRECT LARYNGOSCOPY;  Surgeon: Christia Reading, MD;  Location: [facility] OR;  Service: ENT;  Laterality: N/A;   PEG PLACEMENT N/A 09/03/2020   Procedure: PERCUTANEOUS ENDOSCOPIC GASTROSTOMY (PEG) PLACEMENT;  Surgeon: Violeta Gelinas, MD;  Location: [facility] OR;  Service: General;  Laterality: N/A;   PERCUTANEOUS TRACHEOSTOMY N/A 09/03/2020   Procedure: PERCUTANEOUS TRACHEOSTOMY USING 6 SHILEY;  Surgeon: Violeta Gelinas, MD;  Location: [facility] OR;  Service: General;  Laterality: N/A;   SKIN GRAFT     TONSILLECTOMY AND ADENOIDECTOMY     Past Surgical History:  Procedure Laterality Date   BALLOON DILATION N/A 02/06/2021   Procedure: BALLOON DILATION;  Surgeon: Christia Reading, MD;  Location: [facility] OR;  Service: ENT;  Laterality: N/A;   BOWEL RESECTION  2006   COLONOSCOPY  03/2022   [facility]   MICROLARYNGOSCOPY WITH LASER AND BALLOON DILATION N/A 02/06/2021   Procedure: SUSPENDED MICRO DIRECT LARYNGOSCOPY;  Surgeon: Christia Reading, MD;  Location: [facility] OR;  Service: ENT;  Laterality: N/A;   PEG PLACEMENT N/A 09/03/2020   Procedure: PERCUTANEOUS ENDOSCOPIC GASTROSTOMY (PEG) PLACEMENT;  Surgeon: Violeta Gelinas, MD;  Location: [facility] OR;  Service: General;  Laterality: N/A;   PERCUTANEOUS TRACHEOSTOMY N/A 09/03/2020   Procedure: PERCUTANEOUS TRACHEOSTOMY USING 6 SHILEY;  Surgeon: Violeta Gelinas, MD;  Location: [facility] OR;  Service: General;  Laterality: N/A;   SKIN GRAFT     TONSILLECTOMY AND ADENOIDECTOMY     Past Medical History:  Diagnosis Date   Anxiety 01/19/2020   Arthritis    Asthma    Benign prostatic hyperplasia with weak urinary stream 12/08/2019   BPH (benign prostatic hyperplasia) 08/17/2020   Chronic respiratory failure with hypoxia (HCC) 01/24/2020   Has home 02 but not using as of 01/24/2020  -  01/24/2020   Walked RA  approx   200 ft  @ slow pace  stopped due to  Dizzy with  sats still 95%      Cigarette smoker 01/24/2020   Stopped regular smoking 10/2019 but still "now and then" as of 01/24/2020    COPD (chronic obstructive pulmonary disease) (HCC) 08/17/2020   gold   COPD GOLD ?     Quit smoking 10/2019  - Labs ordered 01/24/2020  :  allergy profile   alpha one AT phenotype   - 01/24/2020  After extensive coaching inhaler device,  effectiveness =    75% (short Ti) > change symb 80 to 160 2bid      Coronary artery disease 06/08/2018   DES to RCA, [facility] [place] [place])   Depression, major, single episode, moderate (HCC)  01/19/2020   Dyspnea    Encounter for screening for malignant neoplasm of colon 12/08/2019   Essential hypertension 12/08/2019   History of MI (myocardial infarction) 01/19/2020   HTN (hypertension) 08/17/2020   Myocardial infarct (HCC) 2019   Pneumonia    Positive colorectal cancer screening using Cologuard test    BP 122/87   Pulse 73   Ht 5\' 8"  (1.727 m)   Wt 190 lb (86.2 kg)   SpO2 93%   BMI 28.89 kg/m   Opioid Risk Score:   Fall Risk Score:  `1  Depression screen PHQ 2/9     04/21/2023    1:10 PM 02/18/2023    1:13 PM 01/16/2023    1:01 PM 01/13/2023    1:24 PM 10/23/2022    1:07 PM 08/27/2022   11:14 AM 06/24/2022    1:02 PM  Depression screen PHQ 2/9  Decreased Interest 0 0 2 0 1 1 1   Down, Depressed, Hopeless 0 0 2 3 1 1 1   PHQ - 2 Score 0 0 4 3 2 2 2   Altered sleeping    1 0    Tired, decreased energy    3 1    Change in appetite    0 0    Feeling bad or failure about yourself     0 0    Trouble concentrating    0 0    Moving slowly or fidgety/restless    0 0    Suicidal thoughts    0 0    PHQ-9 Score    7 3    Difficult doing work/chores     Not difficult at all        Review of Systems  Musculoskeletal:  Positive for back pain and neck pain.        R. Shoulder pain L. Wrist pain R. Hand pain   All other systems reviewed and are negative.     Objective:   Physical Exam Vitals and nursing note  reviewed.  Constitutional:      Appearance: Normal appearance.  Cardiovascular:     Rate and Rhythm: Normal rate and regular rhythm.     Pulses: Normal pulses.     Heart sounds: Normal heart sounds.  Pulmonary:     Effort: Pulmonary effort is normal.     Breath sounds: Normal breath sounds.     Comments: Oxygen TC: 2 liters  Musculoskeletal:     Comments: Normal Muscle Bulk and Muscle Testing Reveals:  Upper Extremities: Full ROM and Muscle Strength 5/5 Lumbar Paraspinal Tenderness: L-3-L-5 Lower Extremities: Full ROM and Muscle Strength 5/5 Arises from Table with ease  Narrow Based  Gait     Skin:    General: Skin is warm and dry.  Neurological:     Mental Status: He is oriented to person, place, and time.  Psychiatric:        Mood and Affect: Mood normal.        Behavior: Behavior normal.         Assessment & Plan:  1.Thoracic Spine Fracture:  Continue HEP as Tolerated. Continue to Monitor. 04/22/2023 2. 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 Continue to Monitor.  3.Subglottic Stenosis/Tracheostomy: Stephannie Peters with 2 liters of Oxygen: Dr. Marene Lenz Following. Continue to Monitor. 04/22/2023 4. Urinary Retention due to BPH: Continue current medication regimen with Hytrin, Flomax and Urecholine. 04/22/2023 4. Chronic  Pain:Syndrome Refilled: Xtampza 13.5 mg every 12 hours# 60 and  and Oxycodone 5 mg 4 times a day as needed for pain #120.  We will continue the opioid monitoring program, this consists of regular clinic visits, examinations, urine drug screen, pill counts as well as use of [place] Controlled Substance Reporting system. A 12 month History has been reviewed on the [place] Controlled Substance Reporting System on 10/27/2022 5. Slow Transit Constipation: Continue with Bowel Regimen. Continue to Monitor. 04/22/2023   F/U in 2 months

## 2023-04-24 ENCOUNTER — Other Ambulatory Visit: Payer: Self-pay | Admitting: Physical Medicine & Rehabilitation

## 2023-04-24 ENCOUNTER — Other Ambulatory Visit: Payer: Self-pay

## 2023-04-27 ENCOUNTER — Other Ambulatory Visit: Payer: Self-pay

## 2023-04-27 ENCOUNTER — Encounter: Payer: Self-pay | Admitting: Internal Medicine

## 2023-04-27 ENCOUNTER — Encounter: Payer: Self-pay | Admitting: Registered Nurse

## 2023-04-27 ENCOUNTER — Other Ambulatory Visit: Payer: Self-pay | Admitting: Internal Medicine

## 2023-04-27 ENCOUNTER — Other Ambulatory Visit (HOSPITAL_COMMUNITY): Payer: Self-pay

## 2023-04-27 DIAGNOSIS — F411 Generalized anxiety disorder: Secondary | ICD-10-CM

## 2023-04-27 MED ORDER — BUSPIRONE HCL 10 MG PO TABS
10.0000 mg | ORAL_TABLET | Freq: Two times a day (BID) | ORAL | 3 refills | Status: DC
  Filled 2023-04-27: qty 60, 30d supply, fill #0
  Filled 2023-05-28: qty 60, 30d supply, fill #1
  Filled 2023-06-29: qty 60, 30d supply, fill #2
  Filled 2023-07-19 – 2023-07-24 (×3): qty 60, 30d supply, fill #3

## 2023-04-27 MED ORDER — TAMSULOSIN HCL 0.4 MG PO CAPS
0.4000 mg | ORAL_CAPSULE | Freq: Two times a day (BID) | ORAL | 3 refills | Status: DC
  Filled 2023-04-27: qty 60, 30d supply, fill #0
  Filled 2023-05-28: qty 60, 30d supply, fill #1
  Filled 2023-06-22: qty 60, 30d supply, fill #2
  Filled 2023-07-19: qty 60, 30d supply, fill #3

## 2023-04-27 MED ORDER — TAMSULOSIN HCL 0.4 MG PO CAPS
0.4000 mg | ORAL_CAPSULE | Freq: Two times a day (BID) | ORAL | 0 refills | Status: DC
  Filled 2023-04-27: qty 60, 30d supply, fill #0

## 2023-04-27 MED ORDER — QUETIAPINE FUMARATE 300 MG PO TABS
ORAL_TABLET | ORAL | 5 refills | Status: DC
  Filled 2023-04-27: qty 60, 30d supply, fill #0
  Filled 2023-05-25: qty 60, 30d supply, fill #1
  Filled 2023-06-22: qty 60, 30d supply, fill #2
  Filled 2023-07-22: qty 60, 30d supply, fill #3
  Filled 2023-08-18: qty 60, 30d supply, fill #4
  Filled 2023-09-14: qty 60, 30d supply, fill #5
  Filled 2023-10-13 (×2): qty 60, 30d supply, fill #6
  Filled 2023-11-09: qty 60, 30d supply, fill #7
  Filled 2023-12-07: qty 60, 30d supply, fill #8
  Filled 2024-01-04: qty 60, 30d supply, fill #9
  Filled 2024-02-01: qty 60, 30d supply, fill #10
  Filled 2024-02-29: qty 60, 30d supply, fill #11

## 2023-04-27 MED ORDER — BUSPIRONE HCL 10 MG PO TABS
10.0000 mg | ORAL_TABLET | Freq: Two times a day (BID) | ORAL | 0 refills | Status: DC
  Filled 2023-04-27: qty 60, 30d supply, fill #0

## 2023-04-29 ENCOUNTER — Other Ambulatory Visit: Payer: Self-pay

## 2023-04-29 ENCOUNTER — Other Ambulatory Visit (HOSPITAL_COMMUNITY): Payer: Self-pay

## 2023-04-29 ENCOUNTER — Telehealth: Payer: Self-pay

## 2023-04-29 NOTE — Telephone Encounter (Signed)
PA for QUEtiapine Fumarate approved valid from 04/29/23-04/28/24

## 2023-04-30 ENCOUNTER — Other Ambulatory Visit: Payer: Self-pay

## 2023-04-30 ENCOUNTER — Other Ambulatory Visit (HOSPITAL_COMMUNITY): Payer: Self-pay

## 2023-04-30 ENCOUNTER — Encounter: Payer: Self-pay | Admitting: Pharmacist

## 2023-05-04 ENCOUNTER — Other Ambulatory Visit: Payer: Self-pay

## 2023-05-04 ENCOUNTER — Other Ambulatory Visit (HOSPITAL_COMMUNITY): Payer: Self-pay

## 2023-05-05 ENCOUNTER — Other Ambulatory Visit: Payer: Self-pay

## 2023-05-06 ENCOUNTER — Other Ambulatory Visit (HOSPITAL_COMMUNITY): Payer: Self-pay

## 2023-05-06 ENCOUNTER — Other Ambulatory Visit: Payer: Self-pay | Admitting: Registered Nurse

## 2023-05-06 ENCOUNTER — Telehealth: Payer: Self-pay | Admitting: Registered Nurse

## 2023-05-06 NOTE — Telephone Encounter (Signed)
PMP was Re viewed.  Charles Weeks, has refills on his Klonopin.  [name] was aske to call the pharmacy.

## 2023-05-07 ENCOUNTER — Telehealth: Payer: Self-pay | Admitting: Registered Nurse

## 2023-05-07 ENCOUNTER — Other Ambulatory Visit (HOSPITAL_COMMUNITY): Payer: Self-pay

## 2023-05-07 ENCOUNTER — Other Ambulatory Visit: Payer: Self-pay

## 2023-05-07 MED ORDER — CLONAZEPAM 0.5 MG PO TABS
0.5000 mg | ORAL_TABLET | Freq: Every day | ORAL | 2 refills | Status: DC
  Filled 2023-05-07: qty 30, 30d supply, fill #0
  Filled 2023-06-03 – 2023-06-04 (×2): qty 30, 30d supply, fill #1

## 2023-05-07 NOTE — Telephone Encounter (Signed)
PMP was Reviewed.  Klonopin e-scribed to pharmacy.  Mr. Dupes sister [name] is aware of the above via My-Chart message.

## 2023-05-08 ENCOUNTER — Encounter: Payer: Self-pay | Admitting: Cardiology

## 2023-05-08 ENCOUNTER — Other Ambulatory Visit (HOSPITAL_COMMUNITY): Payer: Self-pay

## 2023-05-08 ENCOUNTER — Ambulatory Visit: Payer: Medicaid Other | Attending: Cardiology | Admitting: Cardiology

## 2023-05-08 VITALS — BP 118/64 | HR 93 | Ht 68.0 in | Wt 193.4 lb

## 2023-05-08 DIAGNOSIS — E782 Mixed hyperlipidemia: Secondary | ICD-10-CM | POA: Diagnosis present

## 2023-05-08 DIAGNOSIS — I2510 Atherosclerotic heart disease of native coronary artery without angina pectoris: Secondary | ICD-10-CM | POA: Insufficient documentation

## 2023-05-08 DIAGNOSIS — I25119 Atherosclerotic heart disease of native coronary artery with unspecified angina pectoris: Secondary | ICD-10-CM | POA: Insufficient documentation

## 2023-05-08 DIAGNOSIS — Z930 Tracheostomy status: Secondary | ICD-10-CM | POA: Diagnosis present

## 2023-05-08 DIAGNOSIS — J9611 Chronic respiratory failure with hypoxia: Secondary | ICD-10-CM | POA: Diagnosis present

## 2023-05-08 NOTE — Patient Instructions (Addendum)

## 2023-05-08 NOTE — Progress Notes (Signed)
Cardiology Office Note  Date: 05/08/2023   ID: Charles Weeks, DOB December 04, 1965, MRN [id number]  History of Present Illness: Charles Weeks is a 57 y.o. male last seen in June by Ms. Philis Nettle NP, I reviewed the note.  He is here today for a follow-up visit with healthcare advocate.  He does not report any increasing angina, no nitroglycerin use in the interim.  My understanding is that there have been some discussions about whether he would undergo tracheal surgery in anticipation of treating stenosis and ultimately removing his tracheostomy.  He has had evaluation by ENT, pending further discussion with pulmonary.  It sounds like he has a fair number of secretions at baseline, suctions 12 times a day.  Also not hospitalized for pneumonia or decompensation during the time he has had a tracheostomy in place.  I reviewed his medications.  Current cardiac regimen includes aspirin, Toprol-XL, as needed nitroglycerin, and Crestor.  Last LDL was 35.  Recent hemoglobin A1c 6%.  I reviewed his ECG today which shows sinus rhythm with decreased anteroseptal R wave progression.  Nonspecific ST changes.  Last cardiac structural and ischemic evaluation was in 2022.  Physical Exam: VS:  BP 118/64 (BP Location: Right Arm)   Pulse 93   Ht 5\' 8"  (1.727 m)   Wt 193 lb 6.4 oz (87.7 kg)   SpO2 95%   BMI 29.41 kg/m , BMI Body mass index is 29.41 kg/m.  Wt Readings from Last 3 Encounters:  05/08/23 193 lb 6.4 oz (87.7 kg)  04/22/23 190 lb (86.2 kg)  04/21/23 191 lb 9.6 oz (86.9 kg)    General: Patient appears comfortable at rest. HEENT: Conjunctiva and lids normal. Neck: Tracheostomy in place, no carotid bruits. Lungs: Decreased breath sounds throughout without wheezing. Cardiac: Regular rate and rhythm, no S3 or significant systolic murmur. Extremities: No pitting edema.  ECG:  An ECG dated 03/05/2022 was personally reviewed today and demonstrated:  Sinus rhythm with nonspecific ST  changes.  Labwork: 10/23/2022: ALT 20; AST 19; BUN 8; Creatinine, Ser 0.91; Hemoglobin 14.0; Platelets 214; Potassium 4.2; Sodium 143; TSH 2.590     Component Value Date/Time   CHOL 137 03/05/2022 1215   TRIG 298 (H) 03/05/2022 1215   HDL 42 03/05/2022 1215   CHOLHDL 3.3 03/05/2022 1215   VLDL 60 (H) 03/05/2022 1215   LDLCALC 35 03/05/2022 1215  December 2024: Hemoglobin A1c 6%  Other Studies Reviewed Today:  No interval cardiac testing for review today.  Assessment and Plan:  1.  CAD status post DES to the RCA in 2020. Follow-up Myoview in September 2022 was low risk and echocardiogram at that time revealed LVEF 50 to 55%.  No progressive angina based on discussion today, has not used nitroglycerin in the interim.  ECG reviewed and stable.  Would continue medical therapy for now including aspirin, Toprol-XL, and Crestor.   2.  Mixed hyperlipidemia, LDL 35 in October 2023.  He continues on Crestor.  3.  Primary hypertension.  Blood pressure is well-controlled today.  Continue Toprol-XL.   4.  COPD with chronic hypoxic respiratory failure and tracheostomy, following with Dr. Sherene Sires.  As reviewed above, there have been some discussions about considering ENT surgery for management of tracheal stenosis and potentially removal of tracheostomy.  It sounds to me like he still has a lot of secretions that require suctioning.  He would have at least intermediate perioperative risk considering comorbidities and this is not even considering his chronic hypoxic respiratory failure.  If he were to ultimately plan on surgery, we would first arrange an echocardiogram and a Lexiscan Myoview to assess LVEF and ischemic burden.  Bigger question here I think is how much he would benefit from this surgery and what his risk thereafter would be for recurrent tracheal stenosis and another tracheostomy versus increasing risk of pneumonia with continued secretions in the absence of tracheostomy.  He has further  discussion pending with pulmonary and ENT.  Disposition:  Follow up  6 months.  Signed, Jonelle Sidle, M.D., F.A.C.C. [facility]

## 2023-05-11 ENCOUNTER — Other Ambulatory Visit: Payer: Self-pay | Admitting: Internal Medicine

## 2023-05-11 ENCOUNTER — Other Ambulatory Visit: Payer: Self-pay

## 2023-05-11 ENCOUNTER — Encounter: Payer: Self-pay | Admitting: Internal Medicine

## 2023-05-11 ENCOUNTER — Other Ambulatory Visit (HOSPITAL_COMMUNITY): Payer: Self-pay

## 2023-05-11 MED ORDER — BETHANECHOL CHLORIDE 25 MG PO TABS
50.0000 mg | ORAL_TABLET | Freq: Four times a day (QID) | ORAL | 0 refills | Status: DC
  Filled 2023-05-11: qty 720, 90d supply, fill #0

## 2023-05-11 NOTE — Telephone Encounter (Signed)
 Three month supply sent to pharmacy

## 2023-05-12 ENCOUNTER — Other Ambulatory Visit: Payer: Self-pay

## 2023-05-14 ENCOUNTER — Other Ambulatory Visit: Payer: Self-pay

## 2023-05-14 ENCOUNTER — Other Ambulatory Visit (HOSPITAL_COMMUNITY): Payer: Self-pay

## 2023-05-14 MED FILL — Terazosin HCl Cap 5 MG (Base Equivalent): ORAL | 30 days supply | Qty: 30 | Fill #2 | Status: AC

## 2023-05-18 ENCOUNTER — Other Ambulatory Visit: Payer: Self-pay

## 2023-05-18 ENCOUNTER — Other Ambulatory Visit: Payer: Self-pay | Admitting: Internal Medicine

## 2023-05-18 ENCOUNTER — Other Ambulatory Visit (HOSPITAL_COMMUNITY): Payer: Self-pay

## 2023-05-18 DIAGNOSIS — J449 Chronic obstructive pulmonary disease, unspecified: Secondary | ICD-10-CM

## 2023-05-18 MED ORDER — VENTOLIN HFA 108 (90 BASE) MCG/ACT IN AERS
1.0000 | INHALATION_SPRAY | RESPIRATORY_TRACT | 1 refills | Status: DC | PRN
  Filled 2023-05-18: qty 18, 17d supply, fill #0
  Filled 2023-07-09: qty 18, 17d supply, fill #1

## 2023-05-18 MED ORDER — MONTELUKAST SODIUM 10 MG PO TABS
10.0000 mg | ORAL_TABLET | Freq: Every day | ORAL | 3 refills | Status: DC
  Filled 2023-05-18: qty 90, 90d supply, fill #0
  Filled 2023-08-15: qty 90, 90d supply, fill #1
  Filled 2023-11-03: qty 90, 90d supply, fill #2
  Filled 2024-02-07: qty 90, 90d supply, fill #3

## 2023-05-19 ENCOUNTER — Other Ambulatory Visit: Payer: Self-pay

## 2023-05-21 ENCOUNTER — Other Ambulatory Visit (HOSPITAL_BASED_OUTPATIENT_CLINIC_OR_DEPARTMENT_OTHER): Payer: Self-pay

## 2023-05-21 ENCOUNTER — Other Ambulatory Visit (HOSPITAL_COMMUNITY): Payer: Self-pay

## 2023-05-22 ENCOUNTER — Other Ambulatory Visit: Payer: Self-pay

## 2023-05-26 ENCOUNTER — Other Ambulatory Visit (HOSPITAL_COMMUNITY): Payer: Self-pay

## 2023-05-26 ENCOUNTER — Encounter: Payer: Self-pay | Admitting: Physical Medicine & Rehabilitation

## 2023-05-26 ENCOUNTER — Telehealth: Payer: Self-pay | Admitting: Registered Nurse

## 2023-05-26 ENCOUNTER — Other Ambulatory Visit: Payer: Self-pay | Admitting: Registered Nurse

## 2023-05-26 DIAGNOSIS — S22008S Other fracture of unspecified thoracic vertebra, sequela: Secondary | ICD-10-CM

## 2023-05-26 DIAGNOSIS — G894 Chronic pain syndrome: Secondary | ICD-10-CM

## 2023-05-26 MED ORDER — XTAMPZA ER 13.5 MG PO C12A
13.5000 mg | EXTENDED_RELEASE_CAPSULE | Freq: Two times a day (BID) | ORAL | 0 refills | Status: DC
  Filled 2023-05-26: qty 60, 30d supply, fill #0

## 2023-05-26 MED ORDER — OXYCODONE HCL 5 MG PO TABS
5.0000 mg | ORAL_TABLET | ORAL | 0 refills | Status: DC | PRN
  Filled 2023-05-26 – 2023-06-22 (×4): qty 120, 20d supply, fill #0

## 2023-05-26 NOTE — Telephone Encounter (Signed)
 PMP was Reviewed.  Xtampza and Oxycodone e-scribed to pharmacy.  Ms. Charles Weeks is aware via My-Chart, Mr. Flenner sister.

## 2023-05-27 ENCOUNTER — Telehealth: Payer: Self-pay | Admitting: Registered Nurse

## 2023-05-27 ENCOUNTER — Telehealth: Payer: Self-pay

## 2023-05-27 ENCOUNTER — Other Ambulatory Visit (HOSPITAL_COMMUNITY): Payer: Self-pay

## 2023-05-27 MED ORDER — XTAMPZA ER 13.5 MG PO C12A
1.0000 | EXTENDED_RELEASE_CAPSULE | Freq: Two times a day (BID) | ORAL | 0 refills | Status: DC
  Filled 2023-05-27: qty 60, 30d supply, fill #0

## 2023-05-27 NOTE — Telephone Encounter (Signed)
 PA faxed for [facility] 05/27/2023

## 2023-05-27 NOTE — Telephone Encounter (Signed)
 Medicaid Formulary for January 2025 reviewed.  Charles Weeks is listed, PA will be submitted today.  Pharmacist is aware of the above.

## 2023-05-28 ENCOUNTER — Other Ambulatory Visit (HOSPITAL_COMMUNITY): Payer: Self-pay

## 2023-05-28 ENCOUNTER — Encounter: Payer: Self-pay | Admitting: Physical Medicine & Rehabilitation

## 2023-05-28 ENCOUNTER — Telehealth: Payer: Self-pay

## 2023-05-28 ENCOUNTER — Telehealth: Payer: Self-pay | Admitting: Registered Nurse

## 2023-05-28 ENCOUNTER — Encounter (HOSPITAL_COMMUNITY): Payer: Self-pay

## 2023-05-28 ENCOUNTER — Other Ambulatory Visit: Payer: Self-pay

## 2023-05-28 MED ORDER — OXYCODONE HCL ER 15 MG PO T12A
15.0000 mg | EXTENDED_RELEASE_TABLET | Freq: Two times a day (BID) | ORAL | 0 refills | Status: DC
  Filled 2023-05-28: qty 60, 30d supply, fill #0

## 2023-05-28 NOTE — Telephone Encounter (Signed)
 Confirmation #:[id number] WPrior Approval [id number] Status:APPROVED Thank you. Your request has been successfully submitted. Oxycontin

## 2023-05-28 NOTE — Telephone Encounter (Signed)
 PMP was Reviewed.  Xtampza no longer on Mr. [facility] Formulary.  Xtampza discontinued.  Oxycontin e-scribed to Pharmacy.  Ms. [name] Mr. Dunlevy sister is aware of the above via My-Chart and [facility] CMA called Ms. [name].

## 2023-05-29 ENCOUNTER — Other Ambulatory Visit (HOSPITAL_COMMUNITY): Payer: Self-pay

## 2023-06-01 ENCOUNTER — Other Ambulatory Visit (HOSPITAL_COMMUNITY): Payer: Self-pay

## 2023-06-03 ENCOUNTER — Other Ambulatory Visit: Payer: Self-pay

## 2023-06-03 ENCOUNTER — Other Ambulatory Visit (HOSPITAL_COMMUNITY): Payer: Self-pay

## 2023-06-08 ENCOUNTER — Other Ambulatory Visit: Payer: Self-pay | Admitting: Internal Medicine

## 2023-06-08 ENCOUNTER — Other Ambulatory Visit: Payer: Self-pay

## 2023-06-08 MED ORDER — TERAZOSIN HCL 5 MG PO CAPS
5.0000 mg | ORAL_CAPSULE | Freq: Every day | ORAL | 2 refills | Status: DC
  Filled 2023-06-08: qty 30, 30d supply, fill #0
  Filled 2023-07-06: qty 30, 30d supply, fill #1

## 2023-06-09 ENCOUNTER — Encounter (HOSPITAL_COMMUNITY): Payer: Self-pay

## 2023-06-09 ENCOUNTER — Other Ambulatory Visit (HOSPITAL_COMMUNITY): Payer: Self-pay

## 2023-06-09 ENCOUNTER — Other Ambulatory Visit: Payer: Self-pay

## 2023-06-22 ENCOUNTER — Other Ambulatory Visit: Payer: Self-pay

## 2023-06-22 ENCOUNTER — Other Ambulatory Visit (HOSPITAL_COMMUNITY): Payer: Self-pay

## 2023-06-22 ENCOUNTER — Telehealth: Payer: Self-pay

## 2023-06-22 ENCOUNTER — Encounter: Payer: Self-pay | Admitting: Physical Medicine & Rehabilitation

## 2023-06-22 NOTE — Telephone Encounter (Signed)
PA faxed for Oxycodone HCl 5 mg tabs

## 2023-06-23 ENCOUNTER — Other Ambulatory Visit (HOSPITAL_COMMUNITY): Payer: Self-pay

## 2023-06-23 ENCOUNTER — Other Ambulatory Visit: Payer: Self-pay

## 2023-06-24 ENCOUNTER — Other Ambulatory Visit (HOSPITAL_COMMUNITY): Payer: Self-pay

## 2023-06-25 ENCOUNTER — Other Ambulatory Visit: Payer: Self-pay | Admitting: Registered Nurse

## 2023-06-25 ENCOUNTER — Other Ambulatory Visit (HOSPITAL_COMMUNITY): Payer: Self-pay

## 2023-06-25 ENCOUNTER — Encounter: Payer: Self-pay | Admitting: Registered Nurse

## 2023-06-25 ENCOUNTER — Encounter: Payer: Medicaid Other | Attending: Physical Medicine & Rehabilitation | Admitting: Registered Nurse

## 2023-06-25 VITALS — BP 112/77 | HR 88 | Ht 68.0 in | Wt 193.0 lb

## 2023-06-25 DIAGNOSIS — M546 Pain in thoracic spine: Secondary | ICD-10-CM | POA: Diagnosis present

## 2023-06-25 DIAGNOSIS — S22008S Other fracture of unspecified thoracic vertebra, sequela: Secondary | ICD-10-CM | POA: Diagnosis present

## 2023-06-25 DIAGNOSIS — Z5181 Encounter for therapeutic drug level monitoring: Secondary | ICD-10-CM | POA: Diagnosis present

## 2023-06-25 DIAGNOSIS — G8929 Other chronic pain: Secondary | ICD-10-CM | POA: Insufficient documentation

## 2023-06-25 DIAGNOSIS — Z79899 Other long term (current) drug therapy: Secondary | ICD-10-CM | POA: Diagnosis present

## 2023-06-25 DIAGNOSIS — R338 Other retention of urine: Secondary | ICD-10-CM | POA: Diagnosis present

## 2023-06-25 DIAGNOSIS — G894 Chronic pain syndrome: Secondary | ICD-10-CM | POA: Diagnosis not present

## 2023-06-25 DIAGNOSIS — N401 Enlarged prostate with lower urinary tract symptoms: Secondary | ICD-10-CM | POA: Insufficient documentation

## 2023-06-25 DIAGNOSIS — M545 Low back pain, unspecified: Secondary | ICD-10-CM | POA: Diagnosis present

## 2023-06-25 MED ORDER — CLONAZEPAM 0.5 MG PO TABS
0.5000 mg | ORAL_TABLET | Freq: Every day | ORAL | 2 refills | Status: DC
  Filled 2023-06-25 – 2023-07-09 (×3): qty 30, 30d supply, fill #0
  Filled 2023-08-17: qty 30, 30d supply, fill #1
  Filled 2023-08-19 – 2023-09-15 (×2): qty 30, 30d supply, fill #2

## 2023-06-25 MED ORDER — OXYCODONE HCL ER 15 MG PO T12A
15.0000 mg | EXTENDED_RELEASE_TABLET | Freq: Two times a day (BID) | ORAL | 0 refills | Status: DC
  Filled 2023-06-25: qty 60, 30d supply, fill #0

## 2023-06-25 MED ORDER — OXYCODONE HCL 5 MG PO TABS
5.0000 mg | ORAL_TABLET | ORAL | 0 refills | Status: DC | PRN
  Filled 2023-06-25 – 2023-07-17 (×2): qty 120, 20d supply, fill #0

## 2023-06-25 NOTE — Progress Notes (Signed)
 Subjective:    Patient ID: Charles Weeks, male    DOB: Sep 19, 1965, 58 y.o.   MRN: [id number]  HPI: Charles Weeks is a 58 y.o. male who returns for follow up appointment for chronic pain and medication refill. He states his pain is located in his mid- lower back. He rates his pain 8. His current exercise regime is walking.  [name] Morphine  equivalent is 90.00 MME. He  is also prescribed Clonazepam  .We have discussed the black box warning of using opioids and benzodiazepines. I highlighted the dangers of using these drugs together and discussed the adverse events including respiratory suppression, overdose, cognitive impairment and importance of compliance with current regimen. We will continue to monitor and adjust as indicated.   Oral Swab was Performed today.     Pain Inventory Average Pain 8 Pain Right Now 8 My pain is constant, sharp, and burning  In the last 24 hours, has pain interfered with the following? General activity 8 Relation with others 8 Enjoyment of life 8 What TIME of day is your pain at its worst? morning , daytime, evening, and night Sleep (in general) Poor  Pain is worse with: standing Pain improves with: rest and medication Relief from Meds: 2  Family History  Problem Relation Age of Onset   Heart disease Mother    Heart disease Father    Diabetes Sister    Social History   Socioeconomic History   Marital status: Divorced    Spouse name: Not on file   Number of children: 2   Years of education: Not on file   Highest education level: 7th grade  Occupational History   Not on file  Tobacco Use   Smoking status: Former    Current packs/day: 0.00    Types: Cigarettes    Quit date: 08/17/2020    Years since quitting: 2.8   Smokeless tobacco: Never  Vaping Use   Vaping status: Never Used  Substance and Sexual Activity   Alcohol use: Not Currently   Drug use: Not Currently   Sexual activity: Not on file  Other Topics Concern   Not on file   Social History Narrative   ** Merged History Encounter **       Lives with sister  Both daughters live close by   Sister has dog   Enjoys: shooting pool, fishing, therapist, nutritional  Diet: eats all food groups Caffeine: pop-dr pepper 2 liter  Water : 4-6 cups   Wears seat belt  No driving for him Psychologist, sport and exercise  Fi   re Extinguisher No weapons        Social Drivers of Corporate Investment Banker Strain: Low Risk  (04/21/2023)   Overall Financial Resource Strain (CARDIA)    Difficulty of Paying Living Expenses: Not hard at all  Food Insecurity: No Food Insecurity (04/21/2023)   Hunger Vital Sign    Worried About Running Out of Food in the Last Year: Never true    Ran Out of Food in the Last Year: Never true  Transportation Needs: No Transportation Needs (04/21/2023)   PRAPARE - Administrator, Civil Service (Medical): No    Lack of Transportation (Non-Medical): No  Physical Activity: Unknown (04/21/2023)   Exercise Vital Sign    Days of Exercise per Week: 0 days    Minutes of Exercise per Session: Not on file  Stress: No Stress Concern Present (04/21/2023)   [employer] of Occupational Health - Occupational Stress Questionnaire  Feeling of Stress : Only a little  Social Connections: Socially Isolated (04/21/2023)   Social Connection and Isolation Panel [NHANES]    Frequency of Communication with Friends and Family: More than three times a week    Frequency of Social Gatherings with Friends and Family: Once a week    Attends Religious Services: Never    Database Administrator or Organizations: No    Attends Engineer, Structural: Not on file    Marital Status: Divorced   Past Surgical History:  Procedure Laterality Date   BALLOON DILATION N/A 02/06/2021   Procedure: BALLOON DILATION;  Surgeon: Carlie Clark, MD;  Location: [facility] OR;  Service: ENT;  Laterality: N/A;   BOWEL RESECTION  2006   COLONOSCOPY  03/2022   [facility]   MICROLARYNGOSCOPY WITH LASER  AND BALLOON DILATION N/A 02/06/2021   Procedure: SUSPENDED MICRO DIRECT LARYNGOSCOPY;  Surgeon: Carlie Clark, MD;  Location: [facility] OR;  Service: ENT;  Laterality: N/A;   PEG PLACEMENT N/A 09/03/2020   Procedure: PERCUTANEOUS ENDOSCOPIC GASTROSTOMY (PEG) PLACEMENT;  Surgeon: Sebastian Moles, MD;  Location: [facility] OR;  Service: General;  Laterality: N/A;   PERCUTANEOUS TRACHEOSTOMY N/A 09/03/2020   Procedure: PERCUTANEOUS TRACHEOSTOMY USING 6 SHILEY;  Surgeon: Sebastian Moles, MD;  Location: [facility] OR;  Service: General;  Laterality: N/A;   SKIN GRAFT     TONSILLECTOMY AND ADENOIDECTOMY     Past Surgical History:  Procedure Laterality Date   BALLOON DILATION N/A 02/06/2021   Procedure: BALLOON DILATION;  Surgeon: Carlie Clark, MD;  Location: [facility] OR;  Service: ENT;  Laterality: N/A;   BOWEL RESECTION  2006   COLONOSCOPY  03/2022   [facility]   MICROLARYNGOSCOPY WITH LASER AND BALLOON DILATION N/A 02/06/2021   Procedure: SUSPENDED MICRO DIRECT LARYNGOSCOPY;  Surgeon: Carlie Clark, MD;  Location: [facility] OR;  Service: ENT;  Laterality: N/A;   PEG PLACEMENT N/A 09/03/2020   Procedure: PERCUTANEOUS ENDOSCOPIC GASTROSTOMY (PEG) PLACEMENT;  Surgeon: Sebastian Moles, MD;  Location: [facility] OR;  Service: General;  Laterality: N/A;   PERCUTANEOUS TRACHEOSTOMY N/A 09/03/2020   Procedure: PERCUTANEOUS TRACHEOSTOMY USING 6 SHILEY;  Surgeon: Sebastian Moles, MD;  Location: [facility] OR;  Service: General;  Laterality: N/A;   SKIN GRAFT     TONSILLECTOMY AND ADENOIDECTOMY     Past Medical History:  Diagnosis Date   Anxiety 01/19/2020   Arthritis    Asthma    Benign prostatic hyperplasia with weak urinary stream 12/08/2019   BPH (benign prostatic hyperplasia) 08/17/2020   Chronic respiratory failure with hypoxia (HCC) 01/24/2020   Has home 02 but not using as of 01/24/2020  -  01/24/2020   Walked RA  approx   200 ft  @ slow pace  stopped due to  Dizzy with sats still 95%      Cigarette smoker 01/24/2020   Stopped regular smoking  10/2019 but still now and then as of 01/24/2020    COPD (chronic obstructive pulmonary disease) (HCC) 08/17/2020   gold   COPD GOLD ?     Quit smoking 10/2019  - Labs ordered 01/24/2020  :  allergy profile   alpha one AT phenotype   - 01/24/2020  After extensive coaching inhaler device,  effectiveness =    75% (short Ti) > change symb 80 to 160 2bid      Coronary artery disease 06/08/2018   DES to RCA, [facility] [place]   Depression, major, single episode, moderate (HCC) 01/19/2020   Dyspnea    Encounter  for screening for malignant neoplasm of colon 12/08/2019   Essential hypertension 12/08/2019   History of MI (myocardial infarction) 01/19/2020   HTN (hypertension) 08/17/2020   Myocardial infarct (HCC) 2019   Pneumonia    Positive colorectal cancer screening using Cologuard test    BP 112/77   Pulse 88   Ht 5' 8 (1.727 m)   Wt 193 lb (87.5 kg)   SpO2 93% Comment: 3L NV  BMI 29.35 kg/m   Opioid Risk Score:   Fall Risk Score:  `1  Depression screen PHQ 2/9     06/25/2023    1:21 PM 04/21/2023    1:10 PM 02/18/2023    1:13 PM 01/16/2023    1:01 PM 01/13/2023    1:24 PM 10/23/2022    1:07 PM 08/27/2022   11:14 AM  Depression screen PHQ 2/9  Decreased Interest 2 0 0 2 0 1 1  Down, Depressed, Hopeless 2 0 0 2 3 1 1   PHQ - 2 Score 4 0 0 4 3 2 2   Altered sleeping     1 0   Tired, decreased energy     3 1   Change in appetite     0 0   Feeling bad or failure about yourself      0 0   Trouble concentrating     0 0   Moving slowly or fidgety/restless     0 0   Suicidal thoughts     0 0   PHQ-9 Score     7 3   Difficult doing work/chores      Not difficult at all      Review of Systems  Musculoskeletal:  Positive for back pain.  All other systems reviewed and are negative.     Objective:   Physical Exam Vitals and nursing note reviewed.  Constitutional:      Appearance: Normal appearance.  Cardiovascular:     Rate and Rhythm: Normal rate and regular rhythm.      Pulses: Normal pulses.     Heart sounds: Normal heart sounds.  Pulmonary:     Comments: Trach Collar: Oxygen: 2 liters  Musculoskeletal:     Comments: Normal Muscle Bulk and Muscle Testing Reveals:  Upper Extremities: Full ROM and Muscle Strength 5/5 Thoracic Paraspinal Tenderness: T-7-T-10 Lumbar Paraspinal Tenderness: L-4-L-5 Lower Extremities: Full ROM and Muscle Strength 5/5 Arises from Table with Ease Narrow Based  Gait     Skin:    General: Skin is warm and dry.  Neurological:     Mental Status: He is alert and oriented to person, place, and time.  Psychiatric:        Mood and Affect: Mood normal.        Behavior: Behavior normal.         Assessment & Plan:  1.Thoracic Spine Fracture:  Continue HEP as Tolerated. Continue to Monitor. 06/25/2023 2. 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 Continue to Monitor. 06/25/2023 3.Subglottic Stenosis/Tracheostomy: Jamal Chu with 2 liters of Oxygen: Dr. Llewellyn Following. Continue to Monitor. 06/25/2023 4. Urinary Retention due to BPH: Continue current medication regimen with Hytrin , Flomax  and Urecholine . 06/25/2023 4. Chronic  Pain:Syndrome Refilled: Oxycontin  15 mg every 12 hours# 60 and  and Oxycodone  5 mg 4 times a day as needed for pain #120.  We will continue the opioid monitoring program, this consists of regular clinic visits, examinations, urine drug screen, pill counts as well as use of [place]   Controlled Substance Reporting system. A 12 month History has been reviewed on the   Controlled Substance Reporting System on 06/25/2023 5. Slow Transit Constipation: Continue with Bowel Regimen. Continue to Monitor. 06/25/2023   F/U in 2 months

## 2023-06-26 ENCOUNTER — Other Ambulatory Visit (HOSPITAL_COMMUNITY): Payer: Self-pay

## 2023-06-26 ENCOUNTER — Other Ambulatory Visit: Payer: Self-pay

## 2023-06-29 ENCOUNTER — Other Ambulatory Visit: Payer: Self-pay

## 2023-06-29 ENCOUNTER — Other Ambulatory Visit (HOSPITAL_COMMUNITY): Payer: Self-pay

## 2023-06-30 ENCOUNTER — Other Ambulatory Visit (HOSPITAL_COMMUNITY): Payer: Self-pay

## 2023-06-30 ENCOUNTER — Telehealth: Payer: Self-pay | Admitting: Registered Nurse

## 2023-06-30 MED ORDER — OXYCODONE HCL ER 15 MG PO T12A
15.0000 mg | EXTENDED_RELEASE_TABLET | Freq: Two times a day (BID) | ORAL | 0 refills | Status: DC
  Filled 2023-06-30 – 2023-07-24 (×2): qty 60, 30d supply, fill #0

## 2023-06-30 NOTE — Telephone Encounter (Signed)
PMP was Reviewed.  Oxycontin e- scribed to Pharmacy for March. Mr. Gail sister [name] is aware of the above via My-Chart.

## 2023-07-01 ENCOUNTER — Encounter: Payer: Self-pay | Admitting: Internal Medicine

## 2023-07-01 LAB — DRUG TOX MONITOR 1 W/CONF, ORAL FLD
Alprazolam: NEGATIVE ng/mL (ref <0.50)
Aminoclonazepam: 3.46 ng/mL — ABNORMAL HIGH (ref <0.50)
Amphetamines: NEGATIVE ng/mL (ref <10)
Barbiturates: NEGATIVE ng/mL (ref <10)
Benzodiazepines: POSITIVE ng/mL — AB (ref <0.50)
Buprenorphine: NEGATIVE ng/mL (ref <0.10)
Chlordiazepoxide: NEGATIVE ng/mL (ref <0.50)
Clonazepam: 0.51 ng/mL — ABNORMAL HIGH (ref <0.50)
Cocaine: NEGATIVE ng/mL (ref <5.0)
Codeine: NEGATIVE ng/mL (ref <2.5)
Diazepam: NEGATIVE ng/mL (ref <0.50)
Dihydrocodeine: NEGATIVE ng/mL (ref <2.5)
Fentanyl: NEGATIVE ng/mL (ref <0.10)
Flunitrazepam: NEGATIVE ng/mL (ref <0.50)
Flurazepam: NEGATIVE ng/mL (ref <0.50)
Heroin Metabolite: NEGATIVE ng/mL (ref <1.0)
Hydrocodone: NEGATIVE ng/mL (ref <2.5)
Hydromorphone: NEGATIVE ng/mL (ref <2.5)
Lorazepam: NEGATIVE ng/mL (ref <0.50)
MARIJUANA: NEGATIVE ng/mL (ref <2.5)
MDMA: NEGATIVE ng/mL (ref <10)
Meprobamate: NEGATIVE ng/mL (ref <2.5)
Methadone: NEGATIVE ng/mL (ref <5.0)
Midazolam: NEGATIVE ng/mL (ref <0.50)
Morphine: NEGATIVE ng/mL (ref <2.5)
Nicotine Metabolite: NEGATIVE ng/mL (ref <5.0)
Nordiazepam: NEGATIVE ng/mL (ref <0.50)
Norhydrocodone: NEGATIVE ng/mL (ref <2.5)
Noroxycodone: >250 ng/mL — ABNORMAL HIGH (ref <2.5)
Opiates: POSITIVE ng/mL — AB (ref <2.5)
Oxazepam: NEGATIVE ng/mL (ref <0.50)
Oxycodone: >250 ng/mL — ABNORMAL HIGH (ref <2.5)
Oxymorphone: 3.6 ng/mL — ABNORMAL HIGH (ref <2.5)
Phencyclidine: NEGATIVE ng/mL (ref <10)
Tapentadol: NEGATIVE ng/mL (ref <5.0)
Temazepam: NEGATIVE ng/mL (ref <0.50)
Tramadol: NEGATIVE ng/mL (ref <5.0)
Triazolam: NEGATIVE ng/mL (ref <0.50)
Zolpidem: NEGATIVE ng/mL (ref <5.0)

## 2023-07-01 LAB — DRUG TOX ALC METAB W/CON, ORAL FLD: Alcohol Metabolite: NEGATIVE ng/mL (ref <25)

## 2023-07-09 ENCOUNTER — Other Ambulatory Visit: Payer: Self-pay

## 2023-07-09 ENCOUNTER — Other Ambulatory Visit (HOSPITAL_COMMUNITY): Payer: Self-pay

## 2023-07-11 ENCOUNTER — Other Ambulatory Visit (HOSPITAL_COMMUNITY): Payer: Self-pay

## 2023-07-13 ENCOUNTER — Ambulatory Visit (INDEPENDENT_AMBULATORY_CARE_PROVIDER_SITE_OTHER): Payer: Medicaid Other | Admitting: Gastroenterology

## 2023-07-13 ENCOUNTER — Encounter (INDEPENDENT_AMBULATORY_CARE_PROVIDER_SITE_OTHER): Payer: Self-pay | Admitting: Gastroenterology

## 2023-07-13 ENCOUNTER — Telehealth (INDEPENDENT_AMBULATORY_CARE_PROVIDER_SITE_OTHER): Payer: Self-pay

## 2023-07-13 DIAGNOSIS — K567 Ileus, unspecified: Secondary | ICD-10-CM

## 2023-07-13 DIAGNOSIS — K5909 Other constipation: Secondary | ICD-10-CM | POA: Diagnosis not present

## 2023-07-13 DIAGNOSIS — K5903 Drug induced constipation: Secondary | ICD-10-CM

## 2023-07-13 MED ORDER — POLYETHYLENE GLYCOL 3350 17 GM/SCOOP PO POWD
17.0000 g | Freq: Two times a day (BID) | ORAL | 5 refills | Status: DC
  Filled 2023-08-17: qty 952, 28d supply, fill #0
  Filled 2023-09-11: qty 952, 28d supply, fill #1
  Filled 2023-10-04: qty 952, 28d supply, fill #2
  Filled 2023-10-06 – 2023-10-29 (×4): qty 952, 28d supply, fill #3
  Filled 2023-11-24: qty 1020, 30d supply, fill #4
  Filled 2023-12-02: qty 952, 28d supply, fill #4
  Filled 2023-12-30: qty 952, 28d supply, fill #5
  Filled 2024-01-22 (×2): qty 952, 28d supply, fill #6
  Filled 2024-02-24: qty 952, 28d supply, fill #7

## 2023-07-13 NOTE — Progress Notes (Signed)
 Charles Weeks, M.D. Gastroenterology & Hepatology [facility] [address] [place], [place] [address]  Primary Care Physician: Billie Lade, Charles Weeks [address] [place] [address]  I will communicate my assessment and recommendations to the referring Charles Weeks via EMR.   Problems: Ileus, likely exacerbated due to chronic opiate use   History of Present Illness: Charles Weeks is a 58 year old male with past medical history of asthma, COPD, BPH, depression, hypertension, coronary artery disease, myocardial infarction, diverticulitis s/p partial colectomy, chronic neck and back pain due to a fall and status post tracheostomy on home oxygen 3 L at home, who presents for follow up of chronic constipation and transient ileus due to opiate use.  The patient was last seen on 12/24/2022. At that time, the patient was advised to increase pantoprazole to 40 mg twice a day for 4 weeks and was given Reglan as needed for nausea.  Patient advised to continue MiraLAX and to increase Senokot to 2 pills/day.  Patient reports that he has been using 2-3 capfuls of Miralax per day, Sennokot 2 pills per day. With this regimen, he has a bowel movement almost every day.   He has not felt any recetn abdominal pain and bloating. Nausea improved with the use of Reglan, and has an epiosde of nausea possibly 2 times a week which improves with this medication.  The patient denies having any nausea, vomiting, fever, chills, hematochezia, melena, hematemesis, abdominal distention, abdominal pain, diarrhea, jaundice, pruritus.  This procedure was performed on 03/20/2022 by Dr.Jawitz with the following findings: - The examined portion of the ileum was normal. - Four 4 to 5 mm polyps in the descending colon, in  the transverse colon and in the ascending colon,  removed with a cold snare. Resected and retrieved. - Erythematous mucosa in the rectum and at the splenic   flexure, and area of relative luminal stenosis at  approximately 30cm proximal to the anus. Overall query  ischemic vs stercoral injury. Biopsied. - Patent end-to-end colo-colonic anastomosis at 20cm  from the anus, characterized by healthy appearing  mucosa. - Internal hemorrhoids.    Path:  A. Colon polyps, endoscopic polypectomy:  Tubular adenoma, multiple fragments. Benign inflammatory polyp, one fragment.  B. Splenic flexure, endoscopic biopsy:  Colonic mucosa with increased lamina propria chronic inflammatory infiltrates (including eosinophils), surface epithelial degenerative change and focal mild crypt distortion. See comment.  C. Colon, sigmoid, endoscopic biopsy:  Colonic mucosa with mild crypt distortion and increased lamina propria eosinophils. See comment.  B. Rectum, endoscopic biopsy:  Colonic mucosa with increased lamina propria inflammation, cryptitis and surface epithelial degenerative changes. See comment.  Comment: The differential diagnosis includes drug-induced injury, stercoral injury and less likely infectious etiologies. In addition, ischemic injury cannot be ruled out due to atrophic appearance of surface epithelium, however other typical ischemic histologic changes are not seen in these biopsies. Basal plasmacytosis is not seen. Inflammatory bowel disease is not favored.   Given severe comorbidities patient, he was recommended to not have any more colonoscopies in the future unless having clinical symptoms.  Past Medical History: Past Medical History:  Diagnosis Date   Anxiety 01/19/2020   Arthritis    Asthma    Benign prostatic hyperplasia with weak urinary stream 12/08/2019   BPH (benign prostatic hyperplasia) 08/17/2020   Chronic respiratory failure with hypoxia (HCC) 01/24/2020   Has home 02 but not using as of 01/24/2020  -  01/24/2020   Walked RA  approx   200 ft  @ slow pace  stopped due to  Dizzy with sats still 95%      Cigarette smoker  01/24/2020   Stopped regular smoking 10/2019 but still "now and then" as of 01/24/2020    COPD (chronic obstructive pulmonary disease) (HCC) 08/17/2020   gold   COPD GOLD ?     Quit smoking 10/2019  - Labs ordered 01/24/2020  :  allergy profile   alpha one AT phenotype   - 01/24/2020  After extensive coaching inhaler device,  effectiveness =    75% (short Ti) > change symb 80 to 160 2bid      Coronary artery disease 06/08/2018   DES to RCA, [facility] [place])   Depression, major, single episode, moderate (HCC) 01/19/2020   Dyspnea    Encounter for screening for malignant neoplasm of colon 12/08/2019   Essential hypertension 12/08/2019   History of MI (myocardial infarction) 01/19/2020   HTN (hypertension) 08/17/2020   Myocardial infarct (HCC) 2019   Pneumonia    Positive colorectal cancer screening using Cologuard test     Past Surgical History: Past Surgical History:  Procedure Laterality Date   BALLOON DILATION N/A 02/06/2021   Procedure: BALLOON DILATION;  Surgeon: Christia Reading, Charles Weeks;  Location: [facility] OR;  Service: ENT;  Laterality: N/A;   BOWEL RESECTION  2006   COLONOSCOPY  03/2022   [facility]   MICROLARYNGOSCOPY WITH LASER AND BALLOON DILATION N/A 02/06/2021   Procedure: SUSPENDED MICRO DIRECT LARYNGOSCOPY;  Surgeon: Christia Reading, Charles Weeks;  Location: [facility] OR;  Service: ENT;  Laterality: N/A;   PEG PLACEMENT N/A 09/03/2020   Procedure: PERCUTANEOUS ENDOSCOPIC GASTROSTOMY (PEG) PLACEMENT;  Surgeon: Violeta Gelinas, Charles Weeks;  Location: [facility] OR;  Service: General;  Laterality: N/A;   PERCUTANEOUS TRACHEOSTOMY N/A 09/03/2020   Procedure: PERCUTANEOUS TRACHEOSTOMY USING 6 SHILEY;  Surgeon: Violeta Gelinas, Charles Weeks;  Location: [facility] OR;  Service: General;  Laterality: N/A;   SKIN GRAFT     TONSILLECTOMY AND ADENOIDECTOMY      Family History: Family History  Problem Relation Age of Onset   Heart disease Mother    Heart disease Father    Diabetes Sister     Social History: Social History    Tobacco Use  Smoking Status Former   Current packs/day: 0.00   Types: Cigarettes   Quit date: 08/17/2020   Years since quitting: 2.9  Smokeless Tobacco Never   Social History   Substance and Sexual Activity  Alcohol Use Not Currently   Social History   Substance and Sexual Activity  Drug Use Not Currently    Allergies: Allergies  Allergen Reactions   Ciprofloxacin Hives    Medications: Current Outpatient Medications  Medication Sig Dispense Refill   acetaminophen (TYLENOL) 325 MG tablet Take 2 tablets (650 mg total) by mouth every 6 (six) hours as needed for mild pain or moderate pain.     albuterol (PROVENTIL) (2.5 MG/3ML) 0.083% nebulizer solution Inhale 1 vial (3 mLs) into the lungs via nebulizer every 6 (six) hours as needed for wheezing or shortness of breath 90 mL 12   aspirin 81 MG chewable tablet Chew 1 tablet (81 mg total) by mouth daily.     bethanechol (URECHOLINE) 25 MG tablet Take 2 tablets (50 mg total) by mouth 4 (four) times daily. 720 tablet 0   budesonide (PULMICORT) 0.25 MG/2ML nebulizer solution Generic for Pulmicort. Inhale 1 vial via nebulizer twice daily with Perforomist. 60 mL 11   buPROPion [facility]  XL) 300 MG 24 hr tablet Take 1 tablet (300 mg total) by mouth daily. 30 tablet 5   busPIRone (BUSPAR) 10 MG tablet Take 1 tablet (10 mg total) by mouth 2 (two) times daily. 60 tablet 3   Cholecalciferol (VITAMIN D3) 125 MCG (5000 UT) CAPS Take 5,000 Units by mouth daily.     clonazePAM (KLONOPIN) 0.5 MG tablet Take 1 tablet (0.5 mg total) by mouth at bedtime. 30 tablet 2   formoterol (PERFOROMIST) 20 MCG/2ML nebulizer solution Take 2 mLs (20 mcg total) by nebulization 2 (two) times daily. 120 mL 11   metoCLOPramide (REGLAN) 5 MG tablet Take 1 tablet (5 mg total) by mouth every 6 (six) hours as needed for nausea. 10 tablet 0   metoprolol succinate (TOPROL-XL) 25 MG 24 hr tablet Take 0.5 tablets (12.5 mg total) by mouth daily. 45 tablet 3   Misc. Devices  ([employer]) MISC For transporting to and from Doctors appointments 1 each 0   montelukast (SINGULAIR) 10 MG tablet Take 1 tablet (10 mg total) by mouth at bedtime. 90 tablet 3   nitroGLYCERIN (NITROSTAT) 0.4 MG SL tablet Place 1 tablet (0.4 mg total) under the tongue every 5 (five) minutes as needed for chest pain. 25 tablet 2   nystatin ointment (MYCOSTATIN) APPLY AROUND TRACH SITE TWICE DAILY FOR 2 WEEKS. MIX WITH TRIAMCINOLONE.     ondansetron (ZOFRAN) 4 MG tablet TAKE (1) TABLET BY MOUTH EVERY EIGHT HOURS AS NEEDED. 30 tablet 1   [START ON 07/21/2023] oxyCODONE (OXYCONTIN) 15 mg 12 hr tablet Take 1 tablet (15 mg total) by mouth every 12 (twelve) hours. Do Not fill before 07/21/2023 60 tablet 0   oxyCODONE (ROXICODONE) 5 MG immediate release tablet Take 1 tablet (5 mg total) by mouth every 4 (four) hours as needed for severe pain (pain score 7-10). 120 tablet 0   OXYGEN Inhale 3 L into the lungs continuous.     pantoprazole (PROTONIX) 40 MG tablet Take 1 tablet (40 mg total) by mouth daily. 90 tablet 1   QUEtiapine (SEROQUEL) 300 MG tablet Take 0.5 tablets (150 mg total) by mouth 2 (two) times daily AND 1 tablet (300 mg total) at bedtime. 120 tablet 5   rosuvastatin (CRESTOR) 5 MG tablet Take 1 tablet (5 mg total) by mouth daily. 90 tablet 3   senna (SENOKOT) 8.6 MG tablet Take 1 tablet by mouth at bedtime.     tamsulosin (FLOMAX) 0.4 MG CAPS capsule Take 1 capsule (0.4 mg total) by mouth 2 (two) times daily. 60 capsule 3   terazosin (HYTRIN) 5 MG capsule Take 1 capsule (5 mg total) by mouth daily. 30 capsule 2   triamcinolone ointment (KENALOG) 0.1 % APPLY AROUND TRACH SITE TWICE DAILY FOR 2 WEEKS. MIX WITH NYSTATIN.     VENTOLIN HFA 108 (90 Base) MCG/ACT inhaler Inhale 1-2 puffs into the lungs every 4 (four) hours as needed FOR WHEEZING OR SHORTNESS OF BREATH 18 g 1   polyethylene glycol powder (GOODSENSE CLEARLAX) 17 GM/SCOOP powder Take 255 g by mouth daily. 1500 g 5   No current  facility-administered medications for this visit.    Review of Systems: GENERAL: negative for malaise, night sweats HEENT: No changes in hearing or vision, no nose bleeds or other nasal problems. NECK: Negative for lumps, goiter, pain and significant neck swelling RESPIRATORY: Negative for cough, wheezing CARDIOVASCULAR: Negative for chest pain, leg swelling, palpitations, orthopnea GI: SEE HPI MUSCULOSKELETAL: Negative for joint pain or swelling, back pain, and  muscle pain. SKIN: Negative for lesions, rash PSYCH: Negative for sleep disturbance, mood disorder and recent psychosocial stressors. HEMATOLOGY Negative for prolonged bleeding, bruising easily, and swollen nodes. ENDOCRINE: Negative for cold or heat intolerance, polyuria, polydipsia and goiter. NEURO: negative for tremor, gait imbalance, syncope and seizures. The remainder of the review of systems is noncontributory.   Physical Exam: BP 114/80 (BP Location: Left Arm, Patient Position: Sitting, Cuff Size: Normal)   Pulse 95   Temp (!) 97.3 F (36.3 C) (Temporal)   Ht 5' 8.5" (1.74 m)   Wt 194 lb 11.2 oz (88.3 kg)   BMI 29.17 kg/m  GENERAL: The patient is AO x3, in no acute distress. HEENT: Head is normocephalic and atraumatic. EOMI are intact. Mouth is well hydrated and without lesions. NECK: Supple. No masses. Has mask for tracheostomy. LUNGS: Clear to auscultation. No presence of rhonchi/wheezing/rales. Adequate chest expansion HEART: RRR, normal s1 and s2. ABDOMEN: Soft, nontender, no guarding, no peritoneal signs, and nondistended. BS +. No masses. EXTREMITIES: Without any cyanosis, clubbing, rash, lesions or edema. NEUROLOGIC: AOx3, no focal motor deficit. SKIN: no jaundice, no rashes  Imaging/Labs: as above  I personally reviewed and interpreted the available labs, imaging and endoscopic files.  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  Patient has presented significant improvement of his symptoms with current regimen of PEG and Senokot.  Will continue with MiraLAX to 3 capfuls per day and Senokot daily.  He has not presented any other associated symptoms that would warrant any further investigations.  May need to increase dosage of medications if requiring higher dose of opiates.  -Continue with MiraLAX 2-3 capfuls per day -Continue with Senokot 2 pills/day  All questions were answered.      Charles Blazing, Charles Weeks Gastroenterology and Hepatology [facility]

## 2023-07-13 NOTE — Patient Instructions (Signed)
 Continue with MiraLAX 2-3 capfuls per day Continue with Senokot 2 pills/day

## 2023-07-13 NOTE — Telephone Encounter (Signed)
 Thanks

## 2023-07-13 NOTE — Telephone Encounter (Signed)
 Sharyl Nimrod from [employer] called today to get clarification on the patient script for Miralax. I called Sharyl Nimrod back and advised per Dr. Levon Hedger patient to take 17 g two to three times per day prn, dispense three boxes. Sharyl Nimrod states understanding and will fill the patient's script.

## 2023-07-17 ENCOUNTER — Other Ambulatory Visit (HOSPITAL_COMMUNITY): Payer: Self-pay

## 2023-07-20 ENCOUNTER — Other Ambulatory Visit: Payer: Self-pay

## 2023-07-20 ENCOUNTER — Other Ambulatory Visit: Payer: Self-pay | Admitting: Internal Medicine

## 2023-07-20 ENCOUNTER — Other Ambulatory Visit (HOSPITAL_COMMUNITY): Payer: Self-pay

## 2023-07-20 DIAGNOSIS — J449 Chronic obstructive pulmonary disease, unspecified: Secondary | ICD-10-CM

## 2023-07-20 MED ORDER — VENTOLIN HFA 108 (90 BASE) MCG/ACT IN AERS
1.0000 | INHALATION_SPRAY | RESPIRATORY_TRACT | 1 refills | Status: DC | PRN
  Filled 2023-08-06: qty 18, 17d supply, fill #0
  Filled 2023-09-09: qty 18, 17d supply, fill #1

## 2023-07-21 ENCOUNTER — Telehealth: Payer: Medicaid Other | Admitting: Internal Medicine

## 2023-07-21 ENCOUNTER — Encounter: Payer: Self-pay | Admitting: Internal Medicine

## 2023-07-21 ENCOUNTER — Other Ambulatory Visit (HOSPITAL_COMMUNITY): Payer: Self-pay

## 2023-07-21 ENCOUNTER — Other Ambulatory Visit: Payer: Self-pay

## 2023-07-21 DIAGNOSIS — J398 Other specified diseases of upper respiratory tract: Secondary | ICD-10-CM

## 2023-07-21 DIAGNOSIS — N401 Enlarged prostate with lower urinary tract symptoms: Secondary | ICD-10-CM | POA: Diagnosis not present

## 2023-07-21 DIAGNOSIS — R338 Other retention of urine: Secondary | ICD-10-CM | POA: Diagnosis not present

## 2023-07-21 DIAGNOSIS — E1165 Type 2 diabetes mellitus with hyperglycemia: Secondary | ICD-10-CM

## 2023-07-21 MED ORDER — TERAZOSIN HCL 10 MG PO CAPS
10.0000 mg | ORAL_CAPSULE | Freq: Every day | ORAL | 2 refills | Status: DC
  Filled 2023-07-21: qty 30, 30d supply, fill #0

## 2023-07-21 NOTE — Assessment & Plan Note (Signed)
 His acute concern today is increased straining with urination.  He has a known history of BPH complicated by urinary retention.  Currently prescribed Flomax, terazosin, and bethanechol.  Through shared decision making, a referral has been placed to urology for evaluation and management.

## 2023-07-21 NOTE — Assessment & Plan Note (Signed)
 A1c 6.0 on labs from December.  Remains adequately controlled with diet alone.

## 2023-07-21 NOTE — Assessment & Plan Note (Signed)
 Patient reports ongoing discussions with ENT regarding decannulation.  He will follow-up with ENT to further discuss.

## 2023-07-21 NOTE — Progress Notes (Signed)
 Virtual Visit via Video Note  I connected with Charles Weeks on 07/21/23 at  1:40 PM EST by a video enabled telemedicine application and verified that I am speaking with the correct person using two identifiers.  Patient Location: Home Provider Location: Home Office  I discussed the limitations, risks, security, and privacy concerns of performing an evaluation and management service by video and the availability of in person appointments. I also discussed with the patient that there may be a patient responsible charge related to this service. The patient expressed understanding and agreed to proceed.  Subjective: PCP: Charles Lade, MD  Chief Complaint  Patient presents with   Care Management    Three month follow up    Mr. Charles Weeks has been evaluated today through video encounter for routine follow-up.  He was last seen by me in December 2024.  No medication changes were made at that time and 37-month follow-up was arranged.  In the interim, he has been seen by cardiology, gastroenterology, and pain management.  There have otherwise been no acute interval events.  Mr. Charles Weeks reports feeling fairly well today.  He is accompanied by his sister, Charles Weeks.  His acute concern is increased straining with urination.  He has a known history of BPH complicated by urinary retention.  Currently prescribed Flomax, terazosin, and bethanechol.  He would like to know if there is room to increase any of his medications.  He does not have any additional concerns to discuss.  ROS: Per HPI  Current Outpatient Medications:    acetaminophen (TYLENOL) 325 MG tablet, Take 2 tablets (650 mg total) by mouth every 6 (six) hours as needed for mild pain or moderate pain., Disp: , Rfl:    albuterol (PROVENTIL) (2.5 MG/3ML) 0.083% nebulizer solution, Inhale 1 vial (3 mLs) into the lungs via nebulizer every 6 (six) hours as needed for wheezing or shortness of breath, Disp: 90 mL, Rfl: 12   aspirin 81 MG chewable  tablet, Chew 1 tablet (81 mg total) by mouth daily., Disp: , Rfl:    bethanechol (URECHOLINE) 25 MG tablet, Take 2 tablets (50 mg total) by mouth 4 (four) times daily., Disp: 720 tablet, Rfl: 0   budesonide (PULMICORT) 0.25 MG/2ML nebulizer solution, Generic for Pulmicort. Inhale 1 vial via nebulizer twice daily with Perforomist., Disp: 60 mL, Rfl: 11   buPROPion (WELLBUTRIN XL) 300 MG 24 hr tablet, Take 1 tablet (300 mg total) by mouth daily., Disp: 30 tablet, Rfl: 5   busPIRone (BUSPAR) 10 MG tablet, Take 1 tablet (10 mg total) by mouth 2 (two) times daily., Disp: 60 tablet, Rfl: 3   Cholecalciferol (VITAMIN D3) 125 MCG (5000 UT) CAPS, Take 5,000 Units by mouth daily., Disp: , Rfl:    clonazePAM (KLONOPIN) 0.5 MG tablet, Take 1 tablet (0.5 mg total) by mouth at bedtime., Disp: 30 tablet, Rfl: 2   formoterol (PERFOROMIST) 20 MCG/2ML nebulizer solution, Take 2 mLs (20 mcg total) by nebulization 2 (two) times daily., Disp: 120 mL, Rfl: 11   metoCLOPramide (REGLAN) 5 MG tablet, Take 1 tablet (5 mg total) by mouth every 6 (six) hours as needed for nausea., Disp: 10 tablet, Rfl: 0   metoprolol succinate (TOPROL-XL) 25 MG 24 hr tablet, Take 0.5 tablets (12.5 mg total) by mouth daily., Disp: 45 tablet, Rfl: 3   Misc. Devices (TRANSPORT CHAIR) MISC, For transporting to and from Doctors appointments, Disp: 1 each, Rfl: 0   montelukast (SINGULAIR) 10 MG tablet, Take 1 tablet (10 mg total)  by mouth at bedtime., Disp: 90 tablet, Rfl: 3   nitroGLYCERIN (NITROSTAT) 0.4 MG SL tablet, Place 1 tablet (0.4 mg total) under the tongue every 5 (five) minutes as needed for chest pain., Disp: 25 tablet, Rfl: 2   nystatin ointment (MYCOSTATIN), APPLY AROUND TRACH SITE TWICE DAILY FOR 2 WEEKS. MIX WITH TRIAMCINOLONE., Disp: , Rfl:    ondansetron (ZOFRAN) 4 MG tablet, TAKE (1) TABLET BY MOUTH EVERY EIGHT HOURS AS NEEDED., Disp: 30 tablet, Rfl: 1   oxyCODONE (OXYCONTIN) 15 mg 12 hr tablet, Take 1 tablet (15 mg total) by mouth  every 12 (twelve) hours. Do Not fill before 07/21/2023, Disp: 60 tablet, Rfl: 0   oxyCODONE (ROXICODONE) 5 MG immediate release tablet, Take 1 tablet (5 mg total) by mouth every 4 (four) hours as needed for severe pain (pain score 7-10)., Disp: 120 tablet, Rfl: 0   OXYGEN, Inhale 3 L into the lungs continuous., Disp: , Rfl:    pantoprazole (PROTONIX) 40 MG tablet, Take 1 tablet (40 mg total) by mouth daily., Disp: 90 tablet, Rfl: 1   polyethylene glycol powder (GOODSENSE CLEARLAX) 17 GM/SCOOP powder, Take 255 g by mouth daily., Disp: 1500 g, Rfl: 5   QUEtiapine (SEROQUEL) 300 MG tablet, Take 0.5 tablets (150 mg total) by mouth 2 (two) times daily AND 1 tablet (300 mg total) at bedtime., Disp: 120 tablet, Rfl: 5   rosuvastatin (CRESTOR) 5 MG tablet, Take 1 tablet (5 mg total) by mouth daily., Disp: 90 tablet, Rfl: 3   senna (SENOKOT) 8.6 MG tablet, Take 1 tablet by mouth at bedtime., Disp: , Rfl:    tamsulosin (FLOMAX) 0.4 MG CAPS capsule, Take 1 capsule (0.4 mg total) by mouth 2 (two) times daily., Disp: 60 capsule, Rfl: 3   triamcinolone ointment (KENALOG) 0.1 %, APPLY AROUND TRACH SITE TWICE DAILY FOR 2 WEEKS. MIX WITH NYSTATIN., Disp: , Rfl:    VENTOLIN HFA 108 (90 Base) MCG/ACT inhaler, Inhale 1-2 puffs into the lungs every 4 (four) hours as needed FOR WHEEZING OR SHORTNESS OF BREATH, Disp: 18 g, Rfl: 1  Assessment and Plan:  Urinary retention due to benign prostatic hyperplasia Assessment & Plan: His acute concern today is increased straining with urination.  He has a known history of BPH complicated by urinary retention.  Currently prescribed Flomax, terazosin, and bethanechol.  Through shared decision making, a referral has been placed to urology for evaluation and management.  Tracheal stenosis Assessment & Plan: Patient reports ongoing discussions with ENT regarding decannulation.  He will follow-up with ENT to further discuss.  Type 2 diabetes mellitus with hyperglycemia, without  long-term current use of insulin (HCC) Assessment & Plan: A1c 6.0 on labs from December.  Remains adequately controlled with diet alone.  Follow Up Instructions: Return in about 3 months (around 10/21/2023).   I discussed the assessment and treatment plan with the patient. The patient was provided an opportunity to ask questions, and all were answered. The patient agreed with the plan and demonstrated an understanding of the instructions.   The patient was advised to call back or seek an in-person evaluation if the symptoms worsen or if the condition fails to improve as anticipated.  The above assessment and management plan was discussed with the patient. The patient verbalized understanding of and has agreed to the management plan.   Charles Lade, MD

## 2023-07-22 ENCOUNTER — Other Ambulatory Visit (HOSPITAL_COMMUNITY): Payer: Self-pay

## 2023-07-23 ENCOUNTER — Other Ambulatory Visit (HOSPITAL_COMMUNITY): Payer: Self-pay

## 2023-07-23 ENCOUNTER — Other Ambulatory Visit: Payer: Self-pay

## 2023-07-24 ENCOUNTER — Other Ambulatory Visit (HOSPITAL_COMMUNITY): Payer: Self-pay

## 2023-07-24 ENCOUNTER — Other Ambulatory Visit: Payer: Self-pay

## 2023-07-29 ENCOUNTER — Encounter: Payer: Self-pay | Admitting: Internal Medicine

## 2023-07-30 NOTE — Telephone Encounter (Deleted)
 Updated to video, message patient

## 2023-07-31 ENCOUNTER — Other Ambulatory Visit (HOSPITAL_COMMUNITY): Payer: Self-pay

## 2023-08-05 ENCOUNTER — Other Ambulatory Visit (HOSPITAL_COMMUNITY): Payer: Self-pay

## 2023-08-05 ENCOUNTER — Other Ambulatory Visit: Payer: Self-pay | Admitting: Internal Medicine

## 2023-08-05 MED ORDER — BETHANECHOL CHLORIDE 25 MG PO TABS
50.0000 mg | ORAL_TABLET | Freq: Four times a day (QID) | ORAL | 0 refills | Status: DC
  Filled 2023-08-06: qty 720, 90d supply, fill #0

## 2023-08-06 ENCOUNTER — Other Ambulatory Visit: Payer: Self-pay

## 2023-08-07 ENCOUNTER — Encounter: Payer: Self-pay | Admitting: Internal Medicine

## 2023-08-07 ENCOUNTER — Other Ambulatory Visit (HOSPITAL_COMMUNITY): Payer: Self-pay

## 2023-08-10 ENCOUNTER — Other Ambulatory Visit: Payer: Self-pay | Admitting: Internal Medicine

## 2023-08-10 ENCOUNTER — Other Ambulatory Visit: Payer: Self-pay

## 2023-08-10 DIAGNOSIS — F3341 Major depressive disorder, recurrent, in partial remission: Secondary | ICD-10-CM

## 2023-08-10 MED ORDER — BUPROPION HCL ER (XL) 300 MG PO TB24
300.0000 mg | ORAL_TABLET | Freq: Every day | ORAL | 5 refills | Status: DC
  Filled 2023-08-10: qty 30, 30d supply, fill #0
  Filled 2023-09-05: qty 30, 30d supply, fill #1
  Filled 2023-10-05: qty 30, 30d supply, fill #2
  Filled 2023-11-02: qty 30, 30d supply, fill #3
  Filled 2023-11-30: qty 30, 30d supply, fill #4
  Filled 2023-12-28: qty 30, 30d supply, fill #5

## 2023-08-11 ENCOUNTER — Other Ambulatory Visit (HOSPITAL_COMMUNITY): Payer: Self-pay

## 2023-08-13 ENCOUNTER — Other Ambulatory Visit (HOSPITAL_COMMUNITY): Payer: Self-pay

## 2023-08-13 ENCOUNTER — Other Ambulatory Visit: Payer: Self-pay | Admitting: Registered Nurse

## 2023-08-13 DIAGNOSIS — G894 Chronic pain syndrome: Secondary | ICD-10-CM

## 2023-08-13 DIAGNOSIS — S22008S Other fracture of unspecified thoracic vertebra, sequela: Secondary | ICD-10-CM

## 2023-08-14 ENCOUNTER — Telehealth: Payer: Self-pay | Admitting: Registered Nurse

## 2023-08-14 ENCOUNTER — Encounter: Payer: Self-pay | Admitting: Physical Medicine & Rehabilitation

## 2023-08-14 ENCOUNTER — Other Ambulatory Visit: Payer: Self-pay

## 2023-08-14 ENCOUNTER — Other Ambulatory Visit (HOSPITAL_COMMUNITY): Payer: Self-pay

## 2023-08-14 DIAGNOSIS — S22008S Other fracture of unspecified thoracic vertebra, sequela: Secondary | ICD-10-CM

## 2023-08-14 DIAGNOSIS — G894 Chronic pain syndrome: Secondary | ICD-10-CM

## 2023-08-14 MED ORDER — OXYCODONE HCL ER 15 MG PO T12A
15.0000 mg | EXTENDED_RELEASE_TABLET | Freq: Two times a day (BID) | ORAL | 0 refills | Status: DC
  Filled 2023-08-14 – 2023-08-21 (×3): qty 60, 30d supply, fill #0

## 2023-08-14 MED ORDER — OXYCODONE HCL 5 MG PO TABS
5.0000 mg | ORAL_TABLET | ORAL | 0 refills | Status: DC | PRN
  Filled 2023-08-14: qty 120, 20d supply, fill #0

## 2023-08-14 NOTE — Telephone Encounter (Signed)
 PMP was Reviewed.  Oxycontin and Oxycodone e-scribed to pharmacy.  Mr. Smallman sister is aware via My-Chart message.

## 2023-08-14 NOTE — Telephone Encounter (Signed)
 Patients sister called in requesting medication refill sent to [facility] , patient need Oxycodone 5mg  sent to [facility] , states patient will be out of medication for the weekend now and is requesting a call back

## 2023-08-15 ENCOUNTER — Other Ambulatory Visit: Payer: Self-pay | Admitting: Internal Medicine

## 2023-08-15 DIAGNOSIS — K219 Gastro-esophageal reflux disease without esophagitis: Secondary | ICD-10-CM

## 2023-08-17 ENCOUNTER — Other Ambulatory Visit: Payer: Self-pay

## 2023-08-17 MED ORDER — PANTOPRAZOLE SODIUM 40 MG PO TBEC
40.0000 mg | DELAYED_RELEASE_TABLET | Freq: Every day | ORAL | 1 refills | Status: DC
  Filled 2023-08-17: qty 90, 90d supply, fill #0
  Filled 2023-11-03: qty 90, 90d supply, fill #1

## 2023-08-18 ENCOUNTER — Other Ambulatory Visit: Payer: Self-pay | Admitting: Internal Medicine

## 2023-08-18 ENCOUNTER — Encounter: Payer: Self-pay | Admitting: Registered Nurse

## 2023-08-18 ENCOUNTER — Other Ambulatory Visit (HOSPITAL_COMMUNITY): Payer: Self-pay

## 2023-08-18 ENCOUNTER — Encounter: Payer: Medicaid Other | Attending: Physical Medicine & Rehabilitation | Admitting: Registered Nurse

## 2023-08-18 ENCOUNTER — Other Ambulatory Visit: Payer: Self-pay

## 2023-08-18 VITALS — BP 109/72 | HR 80 | Ht 68.5 in | Wt 197.0 lb

## 2023-08-18 DIAGNOSIS — M545 Low back pain, unspecified: Secondary | ICD-10-CM | POA: Insufficient documentation

## 2023-08-18 DIAGNOSIS — M25512 Pain in left shoulder: Secondary | ICD-10-CM | POA: Insufficient documentation

## 2023-08-18 DIAGNOSIS — Z79899 Other long term (current) drug therapy: Secondary | ICD-10-CM

## 2023-08-18 DIAGNOSIS — S22008S Other fracture of unspecified thoracic vertebra, sequela: Secondary | ICD-10-CM

## 2023-08-18 DIAGNOSIS — Z5181 Encounter for therapeutic drug level monitoring: Secondary | ICD-10-CM

## 2023-08-18 DIAGNOSIS — M25511 Pain in right shoulder: Secondary | ICD-10-CM | POA: Diagnosis not present

## 2023-08-18 DIAGNOSIS — G8929 Other chronic pain: Secondary | ICD-10-CM | POA: Diagnosis present

## 2023-08-18 DIAGNOSIS — G894 Chronic pain syndrome: Secondary | ICD-10-CM | POA: Diagnosis not present

## 2023-08-18 DIAGNOSIS — M546 Pain in thoracic spine: Secondary | ICD-10-CM | POA: Insufficient documentation

## 2023-08-18 MED ORDER — OXYCODONE HCL ER 15 MG PO T12A
15.0000 mg | EXTENDED_RELEASE_TABLET | Freq: Two times a day (BID) | ORAL | 0 refills | Status: DC
  Filled 2023-10-19: qty 60, 30d supply, fill #0

## 2023-08-18 MED ORDER — OXYCODONE HCL 5 MG PO TABS
5.0000 mg | ORAL_TABLET | ORAL | 0 refills | Status: DC | PRN
  Filled 2023-09-11 – 2023-09-15 (×3): qty 120, 20d supply, fill #0

## 2023-08-18 MED ORDER — OXYCODONE HCL 5 MG PO TABS
5.0000 mg | ORAL_TABLET | ORAL | 0 refills | Status: DC | PRN
  Filled 2023-10-13: qty 120, 20d supply, fill #0

## 2023-08-18 NOTE — Progress Notes (Signed)
 Subjective:    Patient ID: Charles Weeks, male    DOB: 09/22/1965, 58 y.o.   MRN: [id number]  HPI: Charles Weeks is a 58 y.o. male who returns for follow up appointment for chronic pain and medication refill. He states his pain is located in his bilateral shoulders, he denies falling He also reports he has pain in his mid- lower back. He  rates his pain 8. His current exercise regime is walking and performing stretching exercises.  Charles Weeks Morphine equivalent is 90.00 MME. Heis also prescribed Clonazepam  .We have discussed the black box warning of using opioids and benzodiazepines. I highlighted the dangers of using these drugs together and discussed the adverse events including respiratory suppression, overdose, cognitive impairment and importance of compliance with current regimen. We will continue to monitor and adjust as indicated.    Last Oral Swab was Performed on 06/25/2023, it was consistent.     Pain Inventory Average Pain 8 Pain Right Now 8 My pain is burning, stabbing, and aching  In the last 24 hours, has pain interfered with the following? General activity 8 Relation with others 8 Enjoyment of life 8 What TIME of day is your pain at its worst? morning , daytime, evening, and night Sleep (in general) Fair  Pain is worse with: walking, bending, sitting, inactivity, standing, and some activites Pain improves with: rest and medication Relief from Meds: 2  Family History  Problem Relation Age of Onset   Heart disease Mother    Heart disease Father    Diabetes Sister    Social History   Socioeconomic History   Marital status: Divorced    Spouse name: Not on file   Number of children: 2   Years of education: Not on file   Highest education level: 7th grade  Occupational History   Not on file  Tobacco Use   Smoking status: Former    Current packs/day: 0.00    Types: Cigarettes    Quit date: 08/17/2020    Years since quitting: 3.0   Smokeless tobacco:  Never  Vaping Use   Vaping status: Never Used  Substance and Sexual Activity   Alcohol use: Not Currently   Drug use: Not Currently   Sexual activity: Not on file  Other Topics Concern   Not on file  Social History Narrative   ** Merged History Encounter **       Lives with sister  Both daughters live close by   Sister has dog   Enjoys: shooting pool, fishing, Therapist, nutritional  Diet: eats all food groups Caffeine: pop-dr pepper 2 liter  Water: 4-6 cups   Wears seat belt  No driving for him Psychologist, sport and exercise  Fi   re Extinguisher No weapons        Social Drivers of Corporate investment banker Strain: Low Risk  (04/21/2023)   Overall Financial Resource Strain (CARDIA)    Difficulty of Paying Living Expenses: Not hard at all  Food Insecurity: No Food Insecurity (04/21/2023)   Hunger Vital Sign    Worried About Running Out of Food in the Last Year: Never true    Ran Out of Food in the Last Year: Never true  Transportation Needs: No Transportation Needs (04/21/2023)   PRAPARE - Administrator, Civil Service (Medical): No    Lack of Transportation (Non-Medical): No  Physical Activity: Unknown (04/21/2023)   Exercise Vital Sign    Days of Exercise per Week: 0 days  Minutes of Exercise per Session: Not on file  Stress: No Stress Concern Present (04/21/2023)   [employer] of Occupational Health - Occupational Stress Questionnaire    Feeling of Stress : Only a little  Social Connections: Socially Isolated (04/21/2023)   Social Connection and Isolation Panel [NHANES]    Frequency of Communication with Friends and Family: More than three times a week    Frequency of Social Gatherings with Friends and Family: Once a week    Attends Religious Services: Never    Database administrator or Organizations: No    Attends Engineer, structural: Not on file    Marital Status: Divorced   Past Surgical History:  Procedure Laterality Date   BALLOON DILATION N/A  02/06/2021   Procedure: BALLOON DILATION;  Surgeon: Christia Reading, MD;  Location: [facility] OR;  Service: ENT;  Laterality: N/A;   BOWEL RESECTION  2006   COLONOSCOPY  03/2022   [facility]   MICROLARYNGOSCOPY WITH LASER AND BALLOON DILATION N/A 02/06/2021   Procedure: SUSPENDED MICRO DIRECT LARYNGOSCOPY;  Surgeon: Christia Reading, MD;  Location: [facility] OR;  Service: ENT;  Laterality: N/A;   PEG PLACEMENT N/A 09/03/2020   Procedure: PERCUTANEOUS ENDOSCOPIC GASTROSTOMY (PEG) PLACEMENT;  Surgeon: Violeta Gelinas, MD;  Location: [facility] OR;  Service: General;  Laterality: N/A;   PERCUTANEOUS TRACHEOSTOMY N/A 09/03/2020   Procedure: PERCUTANEOUS TRACHEOSTOMY USING 6 SHILEY;  Surgeon: Violeta Gelinas, MD;  Location: [facility] OR;  Service: General;  Laterality: N/A;   SKIN GRAFT     TONSILLECTOMY AND ADENOIDECTOMY     Past Surgical History:  Procedure Laterality Date   BALLOON DILATION N/A 02/06/2021   Procedure: BALLOON DILATION;  Surgeon: Christia Reading, MD;  Location: [facility] OR;  Service: ENT;  Laterality: N/A;   BOWEL RESECTION  2006   COLONOSCOPY  03/2022   [facility]   MICROLARYNGOSCOPY WITH LASER AND BALLOON DILATION N/A 02/06/2021   Procedure: SUSPENDED MICRO DIRECT LARYNGOSCOPY;  Surgeon: Christia Reading, MD;  Location: [facility] OR;  Service: ENT;  Laterality: N/A;   PEG PLACEMENT N/A 09/03/2020   Procedure: PERCUTANEOUS ENDOSCOPIC GASTROSTOMY (PEG) PLACEMENT;  Surgeon: Violeta Gelinas, MD;  Location: [facility] OR;  Service: General;  Laterality: N/A;   PERCUTANEOUS TRACHEOSTOMY N/A 09/03/2020   Procedure: PERCUTANEOUS TRACHEOSTOMY USING 6 SHILEY;  Surgeon: Violeta Gelinas, MD;  Location: [facility] OR;  Service: General;  Laterality: N/A;   SKIN GRAFT     TONSILLECTOMY AND ADENOIDECTOMY     Past Medical History:  Diagnosis Date   Anxiety 01/19/2020   Arthritis    Asthma    Benign prostatic hyperplasia with weak urinary stream 12/08/2019   BPH (benign prostatic hyperplasia) 08/17/2020   Chronic respiratory failure with hypoxia (HCC)  01/24/2020   Has home 02 but not using as of 01/24/2020  -  01/24/2020   Walked RA  approx   200 ft  @ slow pace  stopped due to  Dizzy with sats still 95%      Cigarette smoker 01/24/2020   Stopped regular smoking 10/2019 but still "now and then" as of 01/24/2020    COPD (chronic obstructive pulmonary disease) (HCC) 08/17/2020   gold   COPD GOLD ?     Quit smoking 10/2019  - Labs ordered 01/24/2020  :  allergy profile   alpha one AT phenotype   - 01/24/2020  After extensive coaching inhaler device,  effectiveness =    75% (short Ti) > change symb 80 to 160 2bid      Coronary  artery disease 06/08/2018   DES to RCA, [facility] [place], [place])   Depression, major, single episode, moderate (HCC) 01/19/2020   Dyspnea    Encounter for screening for malignant neoplasm of colon 12/08/2019   Essential hypertension 12/08/2019   History of MI (myocardial infarction) 01/19/2020   HTN (hypertension) 08/17/2020   Myocardial infarct (HCC) 2019   Pneumonia    Positive colorectal cancer screening using Cologuard test    There were no vitals taken for this visit.  Opioid Risk Score:   Fall Risk Score:  `1  Depression screen PHQ 2/9     06/25/2023    1:21 PM 04/21/2023    1:10 PM 02/18/2023    1:13 PM 01/16/2023    1:01 PM 01/13/2023    1:24 PM 10/23/2022    1:07 PM 08/27/2022   11:14 AM  Depression screen PHQ 2/9  Decreased Interest 2 0 0 2 0 1 1  Down, Depressed, Hopeless 2 0 0 2 3 1 1   PHQ - 2 Score 4 0 0 4 3 2 2   Altered sleeping     1 0   Tired, decreased energy     3 1   Change in appetite     0 0   Feeling bad or failure about yourself      0 0   Trouble concentrating     0 0   Moving slowly or fidgety/restless     0 0   Suicidal thoughts     0 0   PHQ-9 Score     7 3   Difficult doing work/chores      Not difficult at all      Review of Systems  All other systems reviewed and are negative.      Objective:   Physical Exam Vitals and nursing note reviewed.  Constitutional:       Appearance: Normal appearance.  Cardiovascular:     Rate and Rhythm: Normal rate and regular rhythm.     Pulses: Normal pulses.     Heart sounds: Normal heart sounds.  Pulmonary:     Breath sounds: Wheezing present.     Comments: Continuous Oxygen @ 2 liters TC Musculoskeletal:     Comments: Normal Muscle Bulk and Muscle Testing Reveals:  Upper Extremities: Full ROM and Muscle Strength 5/5 Bilateral AC Joint Tenderness   Thoracic Paraspinal Tenderness: T-7-T-10 Lumbar Paraspinal Tenderness: L-4-L-5 Lower Extremities: Full ROM and Muscle Strength 5/5 Arises from Table Slowly Narrow Based Gait     Skin:    General: Skin is warm and dry.  Neurological:     Mental Status: He is alert and oriented to person, place, and time.         Assessment & Plan:  Acute Bilateral Shoulder Pain: Denies falling. Continue HEP as Tolerated. Continue to Monitor.  Thoracic Spine Fracture:  Continue HEP as Tolerated. Continue to Monitor. 08/18/2023 Chromic Bilateral Low Back Pain without Sciatica: Continue HEP as Tolerated. Continue to Monitor. 08/18/2023 4..Subglottic Stenosis/Tracheostomy: Stephannie Peters with 2 liters of Oxygen: Dr. Marene Lenz Following. Continue to Monitor. 08/18/2023 5. Urinary Retention due to BPH: Continue current medication regimen with Hytrin, Flomax and Urecholine. 08/18/2023 6. Chronic  Pain:Syndrome Refilled: Oxycontin 15 mg every 12 hours# 60 and  and Oxycodone 5 mg 4 times a day as needed for pain #120.  We will continue the opioid monitoring program, this consists of regular clinic visits, examinations, urine drug screen, pill counts as well as use of [place] Controlled Substance Reporting system. A 12 month History has been reviewed on the [place] Controlled Substance Reporting System on 08/18/2023 7.  Slow Transit Constipation: Continue with Bowel Regimen. Continue to Monitor. 08/18/2023   F/U in 2 months

## 2023-08-19 ENCOUNTER — Other Ambulatory Visit (HOSPITAL_COMMUNITY): Payer: Self-pay

## 2023-08-20 ENCOUNTER — Other Ambulatory Visit (HOSPITAL_COMMUNITY): Payer: Self-pay

## 2023-08-20 ENCOUNTER — Other Ambulatory Visit: Payer: Self-pay | Admitting: Internal Medicine

## 2023-08-20 MED ORDER — TAMSULOSIN HCL 0.4 MG PO CAPS: 0.4000 mg | ORAL_CAPSULE | Freq: Two times a day (BID) | ORAL | 3 refills | Status: DC

## 2023-08-21 ENCOUNTER — Other Ambulatory Visit (HOSPITAL_COMMUNITY): Payer: Self-pay

## 2023-08-22 ENCOUNTER — Other Ambulatory Visit (HOSPITAL_COMMUNITY): Payer: Self-pay

## 2023-08-22 ENCOUNTER — Other Ambulatory Visit: Payer: Self-pay | Admitting: Internal Medicine

## 2023-08-22 DIAGNOSIS — F411 Generalized anxiety disorder: Secondary | ICD-10-CM

## 2023-08-24 ENCOUNTER — Encounter: Payer: Self-pay | Admitting: Urology

## 2023-08-24 ENCOUNTER — Other Ambulatory Visit: Payer: Self-pay

## 2023-08-24 ENCOUNTER — Other Ambulatory Visit (HOSPITAL_COMMUNITY): Payer: Self-pay

## 2023-08-24 ENCOUNTER — Ambulatory Visit (INDEPENDENT_AMBULATORY_CARE_PROVIDER_SITE_OTHER): Admitting: Urology

## 2023-08-24 VITALS — BP 115/78 | HR 92

## 2023-08-24 DIAGNOSIS — N401 Enlarged prostate with lower urinary tract symptoms: Secondary | ICD-10-CM | POA: Diagnosis not present

## 2023-08-24 DIAGNOSIS — R3912 Poor urinary stream: Secondary | ICD-10-CM | POA: Diagnosis not present

## 2023-08-24 MED ORDER — TAMSULOSIN HCL 0.4 MG PO CAPS
0.4000 mg | ORAL_CAPSULE | Freq: Two times a day (BID) | ORAL | 3 refills | Status: DC
  Filled 2023-08-24: qty 90, 45d supply, fill #0
  Filled 2023-08-25 – 2023-10-04 (×2): qty 90, 45d supply, fill #1
  Filled 2023-10-06 – 2023-11-23 (×2): qty 90, 45d supply, fill #2
  Filled 2024-01-08: qty 90, 45d supply, fill #3

## 2023-08-24 MED ORDER — FINASTERIDE 5 MG PO TABS
5.0000 mg | ORAL_TABLET | Freq: Every day | ORAL | 3 refills | Status: AC
  Filled 2023-08-24: qty 90, 90d supply, fill #0
  Filled 2023-11-16: qty 90, 90d supply, fill #1
  Filled 2024-02-12: qty 90, 90d supply, fill #2
  Filled 2024-05-13: qty 90, 90d supply, fill #3

## 2023-08-24 MED ORDER — BUSPIRONE HCL 10 MG PO TABS
10.0000 mg | ORAL_TABLET | Freq: Two times a day (BID) | ORAL | 3 refills | Status: DC
  Filled 2023-08-24: qty 60, 30d supply, fill #0
  Filled 2023-09-24: qty 60, 30d supply, fill #1
  Filled 2023-10-26: qty 60, 30d supply, fill #2
  Filled 2023-11-24 – 2023-12-01 (×2): qty 60, 30d supply, fill #3

## 2023-08-24 MED ORDER — TERAZOSIN HCL 5 MG PO CAPS
5.0000 mg | ORAL_CAPSULE | Freq: Every day | ORAL | 3 refills | Status: DC
  Filled 2023-08-24: qty 90, 90d supply, fill #0
  Filled 2023-11-16: qty 90, 90d supply, fill #1
  Filled 2023-11-23 – 2024-02-14 (×2): qty 90, 90d supply, fill #2
  Filled 2024-05-14: qty 90, 90d supply, fill #3

## 2023-08-24 NOTE — Progress Notes (Unsigned)
 08/24/2023 1:19 PM   Charles Weeks 1966-02-08 [id number]  Referring provider: Billie Lade, MD [address] [place],  [place] [address]  No chief complaint on file.   HPI: New pt -   1) BPH - on tamsulosin and terazosin 5 mg. He had a prostate procedure in [place] around 2020. A 2023 CT with mild BPH - 30 g. His 2024 Cr 0.91. He voids with an adequate stream but it can stop and again and take quite a bit of time. No frequency. Some urgency and UUI. Could not leave specimen and did not fill out IPSS. He fell in 2022 and has a trach. Uses home 02. Broke back and neck. He is on bethanechol and had retention in hospital.   No OAC. H/o MI. Carries NTG. Has trach and home O2. Here with personal care assistant.   PMH: Past Medical History:  Diagnosis Date   Anxiety 01/19/2020   Arthritis    Asthma    Benign prostatic hyperplasia with weak urinary stream 12/08/2019   BPH (benign prostatic hyperplasia) 08/17/2020   Chronic respiratory failure with hypoxia (HCC) 01/24/2020   Has home 02 but not using as of 01/24/2020  -  01/24/2020   Walked RA  approx   200 ft  @ slow pace  stopped due to  Dizzy with sats still 95%      Cigarette smoker 01/24/2020   Stopped regular smoking 10/2019 but still "now and then" as of 01/24/2020    COPD (chronic obstructive pulmonary disease) (HCC) 08/17/2020   gold   COPD GOLD ?     Quit smoking 10/2019  - Labs ordered 01/24/2020  :  allergy profile   alpha one AT phenotype   - 01/24/2020  After extensive coaching inhaler device,  effectiveness =    75% (short Ti) > change symb 80 to 160 2bid      Coronary artery disease 06/08/2018   DES to RCA, [facility] [place], [place])   Depression, major, single episode, moderate (HCC) 01/19/2020   Dyspnea    Encounter for screening for malignant neoplasm of colon 12/08/2019   Essential hypertension 12/08/2019   History of MI (myocardial infarction) 01/19/2020   HTN (hypertension) 08/17/2020   Myocardial  infarct (HCC) 2019   Pneumonia    Positive colorectal cancer screening using Cologuard test     Surgical History: Past Surgical History:  Procedure Laterality Date   BALLOON DILATION N/A 02/06/2021   Procedure: BALLOON DILATION;  Surgeon: Christia Reading, MD;  Location: [facility] OR;  Service: ENT;  Laterality: N/A;   BOWEL RESECTION  2006   COLONOSCOPY  03/2022   [facility]   MICROLARYNGOSCOPY WITH LASER AND BALLOON DILATION N/A 02/06/2021   Procedure: SUSPENDED MICRO DIRECT LARYNGOSCOPY;  Surgeon: Christia Reading, MD;  Location: [facility] OR;  Service: ENT;  Laterality: N/A;   PEG PLACEMENT N/A 09/03/2020   Procedure: PERCUTANEOUS ENDOSCOPIC GASTROSTOMY (PEG) PLACEMENT;  Surgeon: Violeta Gelinas, MD;  Location: [facility] OR;  Service: General;  Laterality: N/A;   PERCUTANEOUS TRACHEOSTOMY N/A 09/03/2020   Procedure: PERCUTANEOUS TRACHEOSTOMY USING 6 SHILEY;  Surgeon: Violeta Gelinas, MD;  Location: [facility] OR;  Service: General;  Laterality: N/A;   SKIN GRAFT     TONSILLECTOMY AND ADENOIDECTOMY      Home Medications:  Allergies as of 08/24/2023       Reactions   Ciprofloxacin Hives        Medication List        Accurate as  of August 24, 2023  1:19 PM. If you have any questions, ask your nurse or doctor.          acetaminophen 325 MG tablet Commonly known as: TYLENOL Take 2 tablets (650 mg total) by mouth every 6 (six) hours as needed for mild pain or moderate pain.   albuterol (2.5 MG/3ML) 0.083% nebulizer solution Commonly known as: PROVENTIL Inhale 1 vial (3 mLs) into the lungs via nebulizer every 6 (six) hours as needed for wheezing or shortness of breath   Ventolin HFA 108 (90 Base) MCG/ACT inhaler Generic drug: albuterol Inhale 1-2 puffs into the lungs every 4 (four) hours as needed FOR WHEEZING OR SHORTNESS OF BREATH   aspirin 81 MG chewable tablet Chew 1 tablet (81 mg total) by mouth daily.   bethanechol 25 MG tablet Commonly known as: URECHOLINE Take 2 tablets (50 mg total) by mouth 4  (four) times daily.   budesonide 0.25 MG/2ML nebulizer solution Commonly known as: PULMICORT Generic for Pulmicort. Inhale 1 vial via nebulizer twice daily with Perforomist.   buPROPion 300 MG 24 hr tablet Commonly known as: WELLBUTRIN XL Take 1 tablet (300 mg total) by mouth daily.   busPIRone 10 MG tablet Commonly known as: BUSPAR Take 1 tablet (10 mg total) by mouth 2 (two) times daily.   clonazePAM 0.5 MG tablet Commonly known as: KLONOPIN Take 1 tablet (0.5 mg total) by mouth at bedtime.   formoterol 20 MCG/2ML nebulizer solution Commonly known as: PERFOROMIST Take 2 mLs (20 mcg total) by nebulization 2 (two) times daily.   metoCLOPramide 5 MG tablet Commonly known as: Reglan Take 1 tablet (5 mg total) by mouth every 6 (six) hours as needed for nausea.   metoprolol succinate 25 MG 24 hr tablet Commonly known as: TOPROL-XL Take 0.5 tablets (12.5 mg total) by mouth daily.   montelukast 10 MG tablet Commonly known as: SINGULAIR Take 1 tablet (10 mg total) by mouth at bedtime.   nitroGLYCERIN 0.4 MG SL tablet Commonly known as: NITROSTAT Place 1 tablet (0.4 mg total) under the tongue every 5 (five) minutes as needed for chest pain.   nystatin ointment Commonly known as: MYCOSTATIN APPLY AROUND TRACH SITE TWICE DAILY FOR 2 WEEKS. MIX WITH TRIAMCINOLONE.   ondansetron 4 MG tablet Commonly known as: ZOFRAN TAKE (1) TABLET BY MOUTH EVERY EIGHT HOURS AS NEEDED.   OxyCONTIN 15 MG 12 hr tablet Generic drug: oxyCODONE Take 1 tablet (15 mg total) by mouth every 12 (twelve) hours. Do Not fill before 08/20/2023   oxyCODONE 5 MG immediate release tablet Commonly known as: Roxicodone Take 1 tablet (5 mg total) by mouth every 4 (four) hours as needed for severe pain (pain score 7-10). Start taking on: September 11, 2023   oxyCODONE 15 mg 12 hr tablet Commonly known as: OXYCONTIN Take 1 tablet (15 mg total) by mouth every 12 (twelve) hours. Do Not fill before 09/18/2023 Start  taking on: Sep 18, 2023   oxyCODONE 5 MG immediate release tablet Commonly known as: Roxicodone Take 1 tablet (5 mg total) by mouth every 4 (four) hours as needed for severe pain (pain score 7-10). Start taking on: Oct 09, 2023   OXYGEN Inhale 3 L into the lungs continuous.   pantoprazole 40 MG tablet Commonly known as: PROTONIX Take 1 tablet (40 mg total) by mouth daily.   polyethylene glycol powder 17 GM/SCOOP powder Commonly known as: GoodSense ClearLax Take 17 g by mouth 2 (two) to 3 (three) times daily as needed.  QUEtiapine 300 MG tablet Commonly known as: SEROQUEL Take 0.5 tablets (150 mg total) by mouth 2 (two) times daily AND 1 tablet (300 mg total) at bedtime.   rosuvastatin 5 MG tablet Commonly known as: CRESTOR Take 1 tablet (5 mg total) by mouth daily.   senna 8.6 MG tablet Commonly known as: SENOKOT Take 1 tablet by mouth at bedtime.   tamsulosin 0.4 MG Caps capsule Commonly known as: FLOMAX Take 1 capsule (0.4 mg total) by mouth 2 (two) times daily.   Barista For transporting to and from Doctors appointments   triamcinolone ointment 0.1 % Commonly known as: KENALOG APPLY AROUND TRACH SITE TWICE DAILY FOR 2 WEEKS. MIX WITH NYSTATIN.   Vitamin D3 125 MCG (5000 UT) Caps Take 5,000 Units by mouth daily.        Allergies:  Allergies  Allergen Reactions   Ciprofloxacin Hives    Family History: Family History  Problem Relation Age of Onset   Heart disease Mother    Heart disease Father    Diabetes Sister     Social History:  reports that he quit smoking about 3 years ago. His smoking use included cigarettes. He has never used smokeless tobacco. He reports that he does not currently use alcohol. He reports that he does not currently use drugs.   Physical Exam: BP 115/78   Pulse 92   Constitutional:  Alert and oriented, No acute distress. HEENT: [place] AT, moist mucus membranes.  Trachea midline, no masses. Cardiovascular: No  clubbing, cyanosis, or edema. Respiratory: Normal respiratory effort, no increased work of breathing. GI: Abdomen is soft, nontender, nondistended, no abdominal masses GU: No CVA tenderness Skin: No rashes, bruises or suspicious lesions. Neurologic: Grossly intact, no focal deficits, moving all 4 extremities. Psychiatric: Normal mood and affect.  Laboratory Data: Lab Results  Component Value Date   WBC 7.1 10/23/2022   HGB 14.0 10/23/2022   HCT 42.6 10/23/2022   MCV 91 10/23/2022   PLT 214 10/23/2022    Lab Results  Component Value Date   CREATININE 0.91 10/23/2022    No results found for: "PSA"  No results found for: "TESTOSTERONE"  Lab Results  Component Value Date   HGBA1C 6.0 (H) 04/21/2023    Urinalysis    Component Value Date/Time   COLORURINE STRAW (A) 12/03/2021 1507   APPEARANCEUR CLEAR 12/03/2021 1507   LABSPEC 1.005 12/03/2021 1507   PHURINE 7.0 12/03/2021 1507   GLUCOSEU NEGATIVE 12/03/2021 1507   HGBUR SMALL (A) 12/03/2021 1507   BILIRUBINUR NEGATIVE 12/03/2021 1507   KETONESUR NEGATIVE 12/03/2021 1507   PROTEINUR NEGATIVE 12/03/2021 1507   NITRITE NEGATIVE 12/03/2021 1507   LEUKOCYTESUR NEGATIVE 12/03/2021 1507    Lab Results  Component Value Date   LABMICR 5.5 01/13/2023   BACTERIA NONE SEEN 12/03/2021    Pertinent Imaging: CT 2023   Results for orders placed during the hospital encounter of 12/22/22  DG Abd 1 View  Narrative CLINICAL DATA:  Cough. COPD. Tracheostomy tube. Abdominal distention  EXAM: ABDOMEN - 1 VIEW; CHEST - 2 VIEW  COMPARISON:  Chest x-ray 12/25/2021. abdominal x-ray 11/26/2021  FINDINGS: Chest x-ray: Stable tracheostomy tube. Slight linear opacity identified at the right lung base likely scar or atelectasis. No consolidation, pneumothorax, effusion or edema. Normal cardiopericardial silhouette. Degenerative changes of the spine on lateral view. Frontal view is rotated to the left.  Abdominal x-ray: Gas seen  in nondilated loops of small and large bowel. Surgical changes along the central pelvis.  There are some well rounded density seen in the pelvis which are indeterminate although possibly vascular. Mild air in the stomach.  IMPRESSION: Basilar atelectasis.  Tracheostomy tube.  Nonspecific bowel gas pattern.   Electronically Signed By: Karen Kays M.D. On: 12/27/2022 13:25    Assessment & Plan:    Bph, LUTS - on tams, terazosin and bethanechol. Discussed OAB meds but that would work opposite bethanechol. Disc nature r/b/a to finasteride and he will start. Consider dropping bethanechol and terazosin in the future but stream is slow. Terazosin refilled - confirmed with sister he is on it (Dr. Durwin Nora deleted prematurely from list).   No follow-ups on file.  Jerilee Field, MD  [facility] [address]  [address] [place], [place] [address] [phone]

## 2023-08-25 ENCOUNTER — Other Ambulatory Visit (HOSPITAL_COMMUNITY): Payer: Self-pay

## 2023-08-25 ENCOUNTER — Other Ambulatory Visit: Payer: Self-pay

## 2023-08-25 ENCOUNTER — Telehealth: Payer: Self-pay

## 2023-08-25 LAB — PSA: Prostate Specific Ag, Serum: 0.7 ng/mL (ref 0.0–4.0)

## 2023-08-26 ENCOUNTER — Encounter: Payer: Self-pay | Admitting: Internal Medicine

## 2023-08-26 ENCOUNTER — Other Ambulatory Visit: Payer: Self-pay

## 2023-08-26 DIAGNOSIS — S22008S Other fracture of unspecified thoracic vertebra, sequela: Secondary | ICD-10-CM

## 2023-08-26 DIAGNOSIS — J9611 Chronic respiratory failure with hypoxia: Secondary | ICD-10-CM

## 2023-08-26 MED ORDER — UNABLE TO FIND: 1.0000 | Freq: Every day | 0 refills | Status: AC

## 2023-08-26 NOTE — Telephone Encounter (Signed)
 RX created and printed awaiting for pick up

## 2023-08-29 ENCOUNTER — Encounter: Payer: Self-pay | Admitting: Internal Medicine

## 2023-09-03 ENCOUNTER — Other Ambulatory Visit: Payer: Self-pay

## 2023-09-03 DIAGNOSIS — Z122 Encounter for screening for malignant neoplasm of respiratory organs: Secondary | ICD-10-CM

## 2023-09-03 DIAGNOSIS — Z87891 Personal history of nicotine dependence: Secondary | ICD-10-CM

## 2023-09-03 NOTE — Progress Notes (Signed)
 Received self-referral for initial lung cancer screening scan. Contacted patient and obtained smoking history (started age 58, former smoker having quit 2 years ago at age 54, patient smoked 1PPD while smoking, 42 pack year) as well as answering questions related to the screening process. Patient denies signs/symptoms of lung cancer such as weight loss or hemoptysis. Patient denies comorbidity that would prevent curative treatment if lung cancer were to be found. Patient is scheduled for shared decision making visit and CT scan on 09/15/2023 at 1300.

## 2023-09-05 ENCOUNTER — Other Ambulatory Visit (HOSPITAL_COMMUNITY): Payer: Self-pay

## 2023-09-09 ENCOUNTER — Other Ambulatory Visit (HOSPITAL_COMMUNITY): Payer: Self-pay

## 2023-09-11 ENCOUNTER — Other Ambulatory Visit (HOSPITAL_COMMUNITY): Payer: Self-pay

## 2023-09-11 ENCOUNTER — Other Ambulatory Visit: Payer: Self-pay

## 2023-09-14 ENCOUNTER — Other Ambulatory Visit: Payer: Self-pay

## 2023-09-14 ENCOUNTER — Other Ambulatory Visit (HOSPITAL_COMMUNITY): Payer: Self-pay

## 2023-09-14 ENCOUNTER — Telehealth: Payer: Self-pay | Admitting: *Deleted

## 2023-09-14 NOTE — Progress Notes (Unsigned)
 Charles Weeks  Visit Date: 09/15/23  Visit Type: In-Person at [facility]  LUNG CANCER SCREENING SHARED DECISION-MAKING VISIT - Age: 58 y.o.  - Pack year smoking history: 42 pack-years (1 PPD since from age 67 through 54) - Type of tobacco abuse: Cigarettes - Current smoker or < 15 years of cessation: FORMER (quit at age 70) - No current symptoms of lung cancer:  Patient denies any hemoptysis, unintentional weight loss, and unexplained cough - Risks and benefits of lung cancer screening discussed: Negative: Over-diagnosis, radiation exposure, false positives, and additional testing Positive: Discover early stage lung cancer resulting in higher incidence of cure - Patient educated regarding the importance of adherence to continued lung cancer screening. - Currently, there are no co-morbidities to prevent treatment to therapy for lung cancer and the patient is agreeable to pursue treatment if a malignancy is discovered.  [place]  Preventative Services Task Force recommend annual screening for lung cancer with low-dose CT in adults aged 59 - 80 years who have a 20+ pack year smoking history and currently smoke or have quit smoking within the past 15 years.  Screening should be discontinued once a person has not smoked for 15 years or develops a health problem that substantially limits life expectancy or the ability or willingness to have curative lung surgery.  It is a category B recommendation.  Similar stances are provided by CMS, NCCN, and AATS.  Social History   Tobacco Use   Smoking status: Former    Current packs/day: 0.00    Types: Cigarettes    Quit date: 08/17/2020    Years since quitting: 3.0   Smokeless tobacco: Never  Vaping Use   Vaping status: Never Used  Substance Use Topics   Alcohol use: Not Currently   Drug use: Not Currently     Personal history of tobacco use presenting hazards to health: - This patient meets criteria for low-dose CT lung cancer screening  -  The shared decision making visit discussion included risks and benefits of screening, potential for follow-up, diagnostic testing for abnormal scans, potential for false positive tests, overdiagnosis, discussion about total radiation exposure - Patient stated willingness to undergo diagnostics and treatment as needed - Patient was counseled on smoking cessation to decrease the  risk of lung cancer, pulmonary disease, heart disease, and stroke - Patient has been referred to Lung Cancer Screening Nurse Coordinator for further scheduling of LDCT and for further resources regarding free nicotine  replacement therapy and information about smoking cessation classes - Counseling on the importance of adherence to annual lung cancer LDCT screening, impact of co-morbidities, and ability or willingness to undergo diagnosis and treatment is imperative for compliance of the program. - Counseling on the importance of continued smoking cessation for former smokers; the importance of smoking cessation for current smokers, and information about tobacco cessation interventions have been given to patient including [place] Virtual Smoking Cessation Counselor and 1-800-quit [name] programs.  Yearly follow up will be coordinated by [name], RN, MSN [facility] Oncology Nurse Navigator.)  Charles Dusky, PA-C  09/15/23 1:22 PM

## 2023-09-14 NOTE — Telephone Encounter (Signed)
 Prior auth initiated for oxycodone  5 mg tablets #120 with [employer] Approval Entry Complete Confirmation #:[id number] W

## 2023-09-15 ENCOUNTER — Other Ambulatory Visit (HOSPITAL_COMMUNITY): Payer: Self-pay

## 2023-09-15 ENCOUNTER — Inpatient Hospital Stay: Attending: Physician Assistant | Admitting: Physician Assistant

## 2023-09-15 ENCOUNTER — Other Ambulatory Visit: Payer: Self-pay

## 2023-09-15 ENCOUNTER — Ambulatory Visit (HOSPITAL_COMMUNITY)
Admission: RE | Admit: 2023-09-15 | Discharge: 2023-09-15 | Disposition: A | Discharge status: Home / Self Care | Source: Ambulatory Visit | Attending: Physician Assistant | Admitting: Physician Assistant

## 2023-09-15 DIAGNOSIS — Z87891 Personal history of nicotine dependence: Secondary | ICD-10-CM | POA: Insufficient documentation

## 2023-09-15 DIAGNOSIS — Z122 Encounter for screening for malignant neoplasm of respiratory organs: Secondary | ICD-10-CM | POA: Diagnosis present

## 2023-09-15 NOTE — Telephone Encounter (Signed)
 Submission Date:04/28/2025Status:APPROVED Effective Begin Date:04/28/2025Effective End Date:03/12/2024

## 2023-09-16 ENCOUNTER — Other Ambulatory Visit (HOSPITAL_COMMUNITY): Payer: Self-pay

## 2023-09-16 ENCOUNTER — Telehealth: Payer: Self-pay | Admitting: Registered Nurse

## 2023-09-16 ENCOUNTER — Other Ambulatory Visit: Payer: Self-pay | Admitting: Registered Nurse

## 2023-09-16 MED ORDER — OXYCODONE HCL ER 15 MG PO T12A
15.0000 mg | EXTENDED_RELEASE_TABLET | Freq: Two times a day (BID) | ORAL | 0 refills | Status: DC
  Filled 2023-09-16 – 2023-09-18 (×2): qty 60, 30d supply, fill #0

## 2023-09-16 NOTE — Telephone Encounter (Signed)
 PMP was Reviewed,  Oxycontin  prescription sent to pharmacy.  Antoine Bathe informed.

## 2023-09-17 ENCOUNTER — Other Ambulatory Visit (HOSPITAL_COMMUNITY): Payer: Self-pay

## 2023-09-18 ENCOUNTER — Other Ambulatory Visit: Payer: Self-pay

## 2023-09-18 ENCOUNTER — Other Ambulatory Visit (HOSPITAL_COMMUNITY): Payer: Self-pay

## 2023-09-19 ENCOUNTER — Other Ambulatory Visit: Payer: Self-pay | Admitting: Internal Medicine

## 2023-09-19 DIAGNOSIS — J449 Chronic obstructive pulmonary disease, unspecified: Secondary | ICD-10-CM

## 2023-09-21 ENCOUNTER — Other Ambulatory Visit (HOSPITAL_COMMUNITY): Payer: Self-pay

## 2023-09-21 MED ORDER — VENTOLIN HFA 108 (90 BASE) MCG/ACT IN AERS
1.0000 | INHALATION_SPRAY | RESPIRATORY_TRACT | 1 refills | Status: DC | PRN
  Filled 2023-10-06: qty 18, 17d supply, fill #0
  Filled 2023-10-23: qty 18, 17d supply, fill #1

## 2023-09-23 ENCOUNTER — Other Ambulatory Visit (HOSPITAL_COMMUNITY): Payer: Self-pay

## 2023-09-24 ENCOUNTER — Other Ambulatory Visit (HOSPITAL_COMMUNITY): Payer: Self-pay

## 2023-09-29 ENCOUNTER — Other Ambulatory Visit: Payer: Self-pay

## 2023-09-29 ENCOUNTER — Other Ambulatory Visit (HOSPITAL_COMMUNITY): Payer: Self-pay

## 2023-10-02 ENCOUNTER — Ambulatory Visit (HOSPITAL_COMMUNITY)
Admission: RE | Admit: 2023-10-02 | Discharge: 2023-10-02 | Disposition: A | Discharge status: Home / Self Care | Source: Ambulatory Visit | Attending: Acute Care | Admitting: Acute Care

## 2023-10-02 DIAGNOSIS — Z930 Tracheostomy status: Secondary | ICD-10-CM | POA: Diagnosis present

## 2023-10-02 DIAGNOSIS — J449 Chronic obstructive pulmonary disease, unspecified: Secondary | ICD-10-CM | POA: Diagnosis present

## 2023-10-02 DIAGNOSIS — J9611 Chronic respiratory failure with hypoxia: Secondary | ICD-10-CM | POA: Diagnosis present

## 2023-10-02 DIAGNOSIS — J386 Stenosis of larynx: Secondary | ICD-10-CM | POA: Diagnosis not present

## 2023-10-02 NOTE — Progress Notes (Signed)
 Tracheostomy Procedure Note  [name] [id number] 12/28/1965  Pre Procedure Tracheostomy Information  Trach Brand: Shiley Size: 6.0 6FEN Style: Uncuffed Secured by: Velcro   Procedure: Lucendia Rusk, Trach Education and Trach Change    Post Procedure Tracheostomy Information  Trach Brand: Shiley Size: 6.0 6 FEN Style: Uncuffed Secured by: Velcro   Post Procedure Evaluation:  ETCO2 positive color change from yellow to purple : Yes.   Vital signs:VSS Patients current condition: stable Complications: No apparent complications Trach site exam: clean, dry Wound care done: 4 x 4 gauze Patient did tolerate procedure well.   Education: PMV trial and   Prescription needs: none    Additional needs: New PMV given to patient at this visit Patient brought trach from home  6FEN uncuffed  6DCFN

## 2023-10-02 NOTE — Consult Note (Signed)
 NAME:  Charles Weeks, MRN:  [id number], DOB:  03-01-1966, LOS: 0 ADMISSION DATE:  10/02/2023, CONSULTATION DATE:  5/16 REFERRING MD:  NA self referral , CHIEF COMPLAINT:  trach    History of Present Illness:  58 year old male patient status post tracheostomy following cervical spine injury and prolonged ICU stay back in 2022.  Had been followed primarily by ENT posthospitalization, last note by Dr. Tellis Weeks in September 2024, at that point bedside laryngoscopy showed normal larynx and pharynx, the tracheostomy was visible through the glottis but there was an anterior shelf which significantly narrowed the airway just above the tracheostomy.  At that point commendations were to either a continuing tracheostomy, or be he would need surgical intervention in an effort to improve subglottic stenosis in order to consider capping trials. He has since been primarily cared for by his sister.  Presents today to the tracheostomy clinic as a self-referral Also wanting to discuss risk/benefit of continued tracheostomy versus potential surgical intervention for subglottic stenosis as well as capping trials  Pertinent  Medical History  Tobacco abuse, COPD, BPH, Depression, MI, HTN, remote partial colectomy d/t diverticulitis, chronic neck pain and trach dependent s/p fall chronic hypoxic resp failure  Objective    Pulse ox mid 90s on 3 L Examination: General: 58 year old male patient sitting in a wheelchair he is in no acute distress currently HENT: Normocephalic atraumatic voice is gruff and raspy.  Speaks primarily with finger occlusion, he will speak with PMV but notes fairly significant subjective increased work of breathing.  There is no stridor with PMV, note patient has a 6 fenestrated noncuffed Shiley.  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  He speaks using finger occlusion, prefers not to use the PMV due to subjective increased work of breathing.  At baseline has chronic respiratory failure, chronically on 2 to 3 L via trach collar adapter, and still has fairly significant limitations in regards to activity tolerance only able to walk short distances before worsening shortness of breath and associated anxiety.  Require suctioning frequently during the day. Has known ongoing subglottic stenosis, has been able to use PMV with the assistance of a fenestrated trach.  Can you sister asked me about potentially downsizing his tracheostomy to see if he could tolerate capping or perhaps PMV better.  At this point he has chronic respiratory failure with fairly significant exertional dyspnea and frequent need for suctioning, so I do not think a smaller sized inner cannula would be in his best option for airway maintenance.  In regards to decannulation as long as he has subglottic stenosis he is  not a candidate for decannulation.  1 option that might work would be to consider changing  tracheostomy brands.  By utilizing a Portex size 7 fenestrated trach, the outside diameter of this trach is actually smaller than the current one that he has, but the inside diameter is larger so it might allow for better PMV tolerance. Plan I will see him in 8 weeks Continuing his current size 6 fenestrated tracheostomy, however seeing if we can get a size 7 cuffless fenestrated Portex Continue routine tracheostomy care At this point his anxiety, his chronic respiratory failure, frequent need for suction I do not think he would be a good candidate for evaluation of decannulation especially as this would require an operation in order for to be successful       Review of Systems:   Review of Systems  Constitutional:  Negative for fever and malaise/fatigue.  HENT:         Does not tolerate PMV well  Eyes: Negative.   Respiratory:         Baseline exertional dyspnea with associated anxiety  Cardiovascular: Negative.   Gastrointestinal: Negative.   Genitourinary: Negative.   Neurological: Negative.      Past Medical History:  He,  has a past medical history of Anxiety (01/19/2020), Arthritis, Asthma, Benign prostatic hyperplasia with weak urinary stream (12/08/2019), BPH (benign prostatic hyperplasia) (08/17/2020), Chronic respiratory failure with hypoxia (HCC) (01/24/2020), Cigarette smoker (01/24/2020), COPD (chronic obstructive pulmonary disease) (HCC) (08/17/2020), COPD GOLD ? , Coronary artery disease (06/08/2018), Depression, major, single episode, moderate (HCC) (01/19/2020), Dyspnea, Encounter for screening for malignant neoplasm of colon (12/08/2019), Essential hypertension (12/08/2019), History of MI (myocardial infarction) (01/19/2020), HTN (hypertension) (08/17/2020), Myocardial infarct (HCC) (2019), Pneumonia, and Positive colorectal cancer screening using Cologuard test.   Surgical History:   Past Surgical History:  Procedure Laterality Date   BALLOON DILATION N/A  02/06/2021   Procedure: BALLOON DILATION;  Surgeon: Virgina Grills, MD;  Location: [facility] OR;  Service: ENT;  Laterality: N/A;   BOWEL RESECTION  2006   COLONOSCOPY  03/2022   [facility]   MICROLARYNGOSCOPY WITH LASER AND BALLOON DILATION N/A 02/06/2021   Procedure: SUSPENDED MICRO DIRECT LARYNGOSCOPY;  Surgeon: Virgina Grills, MD;  Location: [facility] OR;  Service: ENT;  Laterality: N/A;   PEG PLACEMENT N/A 09/03/2020   Procedure: PERCUTANEOUS ENDOSCOPIC GASTROSTOMY (PEG) PLACEMENT;  Surgeon: Dorena Gander, MD;  Location[facility] OR;  Service: General;  Laterality: N/A;   PERCUTANEOUS TRACHEOSTOMY N/A 09/03/2020   Procedure: PERCUTANEOUS TRACHEOSTOMY USING 6 SHILEY;  Surgeon: Dorena Gander, MD;  Location: [facility] OR;  Service: General;  Laterality: N/A;   SKIN GRAFT     TONSILLECTOMY AND ADENOIDECTOMY       Social History:   reports that he quit smoking about 3 years ago. His smoking use included cigarettes. He has never used smokeless tobacco. He reports that he does not currently use alcohol. He reports that he does not currently use drugs.   Family History:  His family history includes Diabetes in his sister; Heart disease in his father and mother.   Allergies Allergies  Allergen Reactions   Ciprofloxacin  Hives     Home Medications  Prior to Admission medications   Medication Sig Start Date End Date Taking? Authorizing Provider  acetaminophen  (TYLENOL ) 325 MG tablet Take 2 tablets (650 mg total) by mouth every 6 (six) hours as needed for mild pain or moderate pain. 10/30/20   Angiulli, Everlyn Hockey, PA-C  albuterol  (PROVENTIL ) (2.5 MG/3ML) 0.083% nebulizer solution Inhale  1 vial (3 mLs) into the lungs via nebulizer every 6 (six) hours as needed for wheezing or shortness of breath 12/26/22     aspirin  81 MG chewable tablet Chew 1 tablet (81 mg total) by mouth daily. 11/01/20   Angiulli, Everlyn Hockey, PA-C  bethanechol  (URECHOLINE ) 25 MG tablet Take 2 tablets (50 mg total) by mouth 4 (four) times daily. 08/05/23    Tobi Fortes, MD  budesonide  (PULMICORT ) 0.25 MG/2ML nebulizer solution Generic for Pulmicort . Inhale 1 vial via nebulizer twice daily with Perforomist . 12/15/22   Wert, Michael B, MD  buPROPion  (WELLBUTRIN  XL) 300 MG 24 hr tablet Take 1 tablet (300 mg total) by mouth daily. 08/10/23   Tobi Fortes, MD  busPIRone  (BUSPAR ) 10 MG tablet Take 1 tablet (10 mg total) by mouth 2 (two) times daily. 08/24/23   Dixon, Phillip E, MD  Cholecalciferol (VITAMIN D3) 125 MCG (5000 UT) CAPS Take 5,000 Units by mouth daily.    [provider]  clonazePAM  (KLONOPIN ) 0.5 MG tablet Take 1 tablet (0.5 mg total) by mouth at bedtime. 06/25/23   Jodi Munroe, NP  finasteride  (PROSCAR ) 5 MG tablet Take 1 tablet (5 mg total) by mouth daily. 08/24/23   Christina Coyer, MD  formoterol  (PERFOROMIST ) 20 MCG/2ML nebulizer solution Take 2 mLs (20 mcg total) by nebulization 2 (two) times daily. 01/08/23   Diamond Formica, MD  metoCLOPramide  (REGLAN ) 5 MG tablet Take 1 tablet (5 mg total) by mouth every 6 (six) hours as needed for nausea. 12/24/22   Evander Hills, PA-C  metoprolol  succinate (TOPROL -XL) 25 MG 24 hr tablet Take 0.5 tablets (12.5 mg total) by mouth daily. 12/15/22     Misc. Devices (TRANSPORT [place]) MISC For transporting to and from Doctors appointments 09/06/21   Meldon Sport, MD  montelukast  (SINGULAIR ) 10 MG tablet Take 1 tablet (10 mg total) by mouth at bedtime. 05/18/23   Tobi Fortes, MD  nitroGLYCERIN  (NITROSTAT ) 0.4 MG SL tablet Place 1 tablet (0.4 mg total) under the tongue every 5 (five) minutes as needed for chest pain. 02/18/23   Lasalle Pointer, NP  nystatin ointment (MYCOSTATIN) APPLY AROUND TRACH SITE TWICE DAILY FOR 2 WEEKS. MIX WITH TRIAMCINOLONE . 12/03/22   [provider]  ondansetron  (ZOFRAN ) 4 MG tablet TAKE (1) TABLET BY MOUTH EVERY EIGHT HOURS AS NEEDED. 09/15/22   Meldon Sport, MD  oxyCODONE  (OXYCONTIN ) 15 mg 12 hr tablet Take 1 tablet (15 mg total) by mouth every 12  (twelve) hours. Do Not fill before 09/18/2023 09/18/23   Jodi Munroe, NP  oxyCODONE  (OXYCONTIN ) 15 mg 12 hr tablet Take 1 tablet (15 mg total) by mouth every 12 (twelve) hours. Do Not fill before 09/17/2023 09/16/23   Jodi Munroe, NP  oxyCODONE  (ROXICODONE ) 5 MG immediate release tablet Take 1 tablet (5 mg total) by mouth every 4 (four) hours as needed for severe pain (pain score 7-10). 09/11/23   Jodi Munroe, NP  oxyCODONE  (ROXICODONE ) 5 MG immediate release tablet Take 1 tablet (5 mg total) by mouth every 4 (four) hours as needed for severe pain (pain score 7-10). 10/09/23   Jodi Munroe, NP  OXYGEN Inhale 3 L into the lungs continuous.    [provider]  pantoprazole  (PROTONIX ) 40 MG tablet Take 1 tablet (40 mg total) by mouth daily. 08/17/23   Tobi Fortes, MD  polyethylene glycol powder (GOODSENSE CLEARLAX) 17 GM/SCOOP powder Take 17 g by mouth 2 (two) to 3 (  three) times daily as needed. 07/13/23   Urban Garden, MD  QUEtiapine  (SEROQUEL ) 300 MG tablet Take 0.5 tablets (150 mg total) by mouth 2 (two) times daily AND 1 tablet (300 mg total) at bedtime. 04/27/23   Rawland Caddy, MD  rosuvastatin  (CRESTOR ) 5 MG tablet Take 1 tablet (5 mg total) by mouth daily. 04/14/23   Gerard Knight, MD  senna (SENOKOT) 8.6 MG tablet Take 1 tablet by mouth at bedtime.    [provider]  tamsulosin  (FLOMAX ) 0.4 MG CAPS capsule Take 1 capsule (0.4 mg total) by mouth 2 (two) times daily. 08/24/23   Christina Coyer, MD  terazosin  (HYTRIN ) 5 MG capsule Take 1 capsule (5 mg total) by mouth at bedtime. 08/24/23   Christina Coyer, MD  triamcinolone  ointment (KENALOG ) 0.1 % APPLY AROUND TRACH SITE TWICE DAILY FOR 2 WEEKS. MIX WITH NYSTATIN. 12/03/22   [provider]  UNABLE TO FIND 1 each by Does not apply route daily. Med Name: LIFT CHAIR 08/26/23   Tobi Fortes, MD  UNABLE TO FIND 1 each by Does not apply route daily. Med Name: BATHROOM RENOVATION FOR WALK  IN SHOWER 08/26/23   Tobi Fortes, MD  VENTOLIN  HFA 108 [id number] Base) MCG/ACT inhaler Inhale 1-2 puffs into the lungs every 4 (four) hours as needed FOR WHEEZING OR SHORTNESS OF BREATH 09/21/23   Tobi Fortes, MD  glycopyrrolate  (ROBINUL ) 1 MG tablet Take 1 tablet (1 mg total) by mouth 3 (three) times daily. 10/30/20 10/31/20  Angiulli, Everlyn Hockey, PA-C     My time 46 min

## 2023-10-04 NOTE — Progress Notes (Signed)
 Charles Weeks, male    DOB: 19-May-1966,    MRN: [id number]   Brief patient profile:  70 yowm illiterate though says finished 7th grade with asthma since in his 37s  MM/quit smoking 08/2020 on disability since last PFT's ? 2017/18 relocated to [facility] summer 2021 and referred to pulmonary clinic in [place]  01/24/2020 by Roland Cleveland NP.  Admit date: 11/04/2019 Discharge date: 11/10/2019   Admitted From: Home Disposition: Home       Home Health: Equipment/Devices: Home oxygen at 3 L, nebulizer machine   Discharge Condition: Stable CODE STATUS: Full code Diet recommendation: Heart healthy   Brief/Interim Summary: Charles Weeks is a 58 y.o. male with a history of CAD, MI, COPD.  Patient just recently moved to [place]  from [place] .  He has no medications and has not seen a physician here.  He started having shortness of breath about 3 weeks ago which has been increasing.  He has been having cough with some purulent sputum production.  He had a relative give him some steroids over the past few days, without too much improvement.  No other palliating or provoking factors.  Denies fevers, chills, nausea, vomiting.   Emergency Department Course: Patient started on BiPAP and gradually weaned to nasal cannula.  Initial saturations were in the 80s.  Improved with steroids and BiPAP.  Remained stable on nasal cannula.   Discharge Diagnoses:  Principal Problem:   Acute respiratory failure with hypoxia (HCC) Active Problems:   Coronary artery disease   COPD with acute exacerbation (HCC)   Acute on chronic hypoxic respiratory failure secondary to acute COPD exacerbation Presented with hypoxia with O2 saturation in the 80s initially requiring NIV then transitioned to 3-4 L  -previously on 3L at home but has not used since Oct 2020 -Continue brovana  and pulmicort  -Continue hypertonic saline nebs -continue duonebs -He was treated with IV steroids which have since been  transitioned to prednisone  taper -Personally reviewed chest x-ray which showed no lobular pulmonary infiltrates to suggest pneumonia -Maintain O2 saturation greater than 88 to 90% -TOC consulted to assist with PCP and pulmonology referrals -Currently, he can ambulate comfortably on 3 L of oxygen which appears to be near his baseline   -Acute COPD exacerbation Ongoing tobacco use; tobacco cessation counseling at bedside. COVID-19 negative Treated with IV steroids which was subsequently transitioned to prednisone  taper Continue management as stated above -Continue Symbicort  on discharge -Continue albuterol  nebs as needed, provided with nebulizer machine   Hyperglycemia/impaired glucose tolerance Presented with elevated serum glucose Hemoglobin A1c 6.3 on 619 1221 Monitored on sliding scale insulin    Coronary artery disease status post PCI/history of MI Denies any anginal symptoms at the time of this exam Continue Brilinta   Not on statin, obtain lipid panel   Tobacco use disorder Tobacco cessation counseling Nicotine  patch   BPH Continue terazosin  Monitor urine output    History of Present Illness  01/24/2020  Pulmonary/ 1st office eval/ Charles Weeks / [place] [facility]  Chief Complaint  Patient presents with   Pulmonary Consult    Referred by Nichola Barges, NP.  Pt states hx of COPD. Pt c/o increased SOB since Winter 2020. He sometimes gets winded walking room to room at home. He is using albuterol  inhaler and neb both 1-2 x per day.   Dyspnea:  [employer] walking / walk from [facility] space (sisters) / no steps but there are times he can't get across the room s sob  Cough: better since quit  Sleep:  flat bed/ 1 pillow  - no resp  SABA use:neb  once or twice daily "can't make it thru the day" and also using 2 different forms of hfa  02  Prn  rec Plan A = Automatic = Always=    Symbicort  160 Take 2 puffs first thing in am and then another 2 puffs about 12 hours later.  Work on inhaler  technique: Plan B = Backup (to supplement plan A, not to replace it) Only use your albuterol  inhaler as a rescue medication Plan C = Crisis (instead of Plan B but only if Plan B stops working) - only use your albuterol  nebulizer if you first try Plan B and it fails to help  Make sure you check your oxygen saturations at highest level of activity  Please schedule a follow up visit in 3 months with pfts on return      05/31/2020  f/u ov/[place] office/Charles Weeks re: needs to go to lab / did stop smoking Chief Complaint  Patient presents with   Follow-up    Patient feels like breathing is the same since last visit. Cough but states that it is hard to get up mucus.   Dyspnea: basically housebound/ walks dog to porch / 25 ft bd Cough: non productive, last cig jun 2021 much better since then  Sleeping: flat bed/ no bipap/ wakes up feeling find SABA use: rarely neb,  Twice a daily proair  never pre challenges or rechallenges  02: has it not using / not tracking sats with ex  Rec Plan A = Automatic = Always=    Symbicort  160 Take 2 puffs first thing in am and then another 2 puffs about 12 hours later.  Work on inhaler technique:     Plan B = Backup (to supplement plan A, not to replace it) Only use your albuterol  inhaler as a rescue medication  Plan C = Crisis (instead of Plan B but only if Plan B stops working) - only use your albuterol  nebulizer if you first try Plan B and it fails to help > ok to use the nebulizer up to every 4 hours but if start needing it regularly call for immediate appointment Make sure you check your oxygen saturations at highest level of activity to be sure it stays over 90%  Please remember to go to the lab and x-ray department at [facility]   for your tests - we will call you with the results when they are available. Please schedule a follow up visit in 3 months with pfts on return    Admitted August 17 2020 to [facility]  p neck fx > [facility] > d/c home with trach  and PEG and f/u by Dr Tellis Feathers who rec laser surgery on trach   12/24/2020  f/u ov/[place] office/Charles Weeks re: GOLD IV post hosp  Chief Complaint  Patient presents with   Follow-up    Patient states that he would rather have Symbicort  over Duelra inhaler feels like it does not work as good. Patient was admitted on 10/09/20. Sister states he will need surgical clearance today for his ENT Dr. Tellis Feathers  Dyspnea:  mb and back 100 ft flat s 02  Cough: clear phlegm  Sleeping: bed is flat / on back one pillow SABA use: neb twice daily  02: 4lpm  Covid status: 2 vax  Rec Ok to change dulera  to symbicort  160 Take 2 puffs first thing in am and then another 2 puffs about 12 hours later.  You are cleared for surgery > Tellis Feathers  02/06/21 balloon dilatation/ no laser          12/26/2022  Acute  ov/[place] office/Charles Weeks re: trach dep/AB  maint on performist/ bud   Chief Complaint  Patient presents with   Acute Visit    Wheezing/shob  Dyspnea:  worse x10 days / not following AVS instructions/ Action plan  Cough: mucus turned dark green  a few days prior to OV   Sleeping: flat bed with one pillow does fine  SABA use: using in place of bud/ performist  02: 3lpm not titrating as rec  Rec Plan A = Automatic = Always=  Performist (formoterol ) 20 and budesonide  0.25 mg 1st thing and 12 hours later  Plan B = Backup (to supplement plan A, not to replace it) Only use your albuterol  inhaler as a rescue medication  Plan C = Crisis (instead of Plan B but only if Plan B stops working) - only use your albuterol  nebulizer if you first try Plan B Zpak  Make sure you check your oxygen saturation  AT  your highest level of activity (not after you stop)   to be sure it stays over 90%  Please schedule a follow up visit in 3 months but call sooner if needed  with all medications /inhalers/ solutions in hand     03/31/2023  f/u ov/[place] office/Charles Weeks re: trach dep/ AB /  maint on  performist /budesonide  and 02  did bring  meds  Chief Complaint  Patient presents with   COPD   Cough  Dyspnea:  very little activity - 100 ft to mb/ flat  Cough: none  Sleeping: flat bed / one pilow s  resp cc  SABA use: rarely  02: 2- 3lpm  Lung cancer screening: referred today Rec Make sure you check your oxygen saturation  AT  your highest level of activity (not after you stop)   to be sure it stays over 90%   Please schedule a follow up visit in 6 months but call sooner if needed   10/07/2023  f/u ov/ office/Charles Weeks re: trach/AB  maint on performis/bud bid  Chief Complaint  Patient presents with   Shortness of Breath   COPD  Dyspnea:  very little activity / no longer even walking to mb  Cough: usual cough > a bit darker mucus x sev weeks Sleeping: flat bed / one pillow mostly on side s    resp cc  SABA use: occ hfa  02:  2-3lpm   Lung cancer screening: 09/15/23    No obvious day to day or daytime variability or assoc excess/ purulent sputum or mucus plugs or hemoptysis or cp or chest tightness, subjective wheeze or overt  hb symptoms.    Also denies any obvious fluctuation of symptoms with weather or environmental changes or other aggravating or alleviating factors except as outlined above   No unusual exposure hx or h/o childhood pna/ asthma or knowledge of premature birth.  Current Allergies, Complete Past Medical History, Past Surgical History, Family History, and Social History were reviewed in [employer] record.  ROS  The following are not active complaints unless bolded Hoarseness, sore throat, dysphagia, dental problems, itching, sneezing,  nasal congestion or discharge of excess mucus or purulent secretions, ear ache,   fever, chills, sweats, unintended wt loss or wt gain, classically pleuritic or exertional cp,  orthopnea pnd or arm/hand swelling  or leg swelling, presyncope, palpitations, abdominal pain, anorexia, nausea, vomiting,  diarrhea  or change in bowel habits or  change in bladder habits, change in stools or change in urine, dysuria, hematuria,  rash, arthralgias, visual complaints, headache, numbness, weakness or ataxia or problems with walking or coordination,  change in mood or  memory.        Current Meds  Medication Sig   acetaminophen  (TYLENOL ) 325 MG tablet Take 2 tablets (650 mg total) by mouth every 6 (six) hours as needed for mild pain or moderate pain.   albuterol  (PROVENTIL ) (2.5 MG/3ML) 0.083% nebulizer solution Inhale 1 vial (3 mLs) into the lungs via nebulizer every 6 (six) hours as needed for wheezing or shortness of breath   aspirin  81 MG chewable tablet Chew 1 tablet (81 mg total) by mouth daily.   bethanechol  (URECHOLINE ) 25 MG tablet Take 2 tablets (50 mg total) by mouth 4 (four) times daily.   budesonide  (PULMICORT ) 0.25 MG/2ML nebulizer solution Generic for Pulmicort . Inhale 1 vial via nebulizer twice daily with Perforomist .   buPROPion  (WELLBUTRIN  XL) 300 MG 24 hr tablet Take 1 tablet (300 mg total) by mouth daily.   busPIRone  (BUSPAR ) 10 MG tablet Take 1 tablet (10 mg total) by mouth 2 (two) times daily.   Cholecalciferol (VITAMIN D3) 125 MCG (5000 UT) CAPS Take 5,000 Units by mouth daily.   clonazePAM  (KLONOPIN ) 0.5 MG tablet Take 1 tablet (0.5 mg total) by mouth at bedtime.   finasteride  (PROSCAR ) 5 MG tablet Take 1 tablet (5 mg total) by mouth daily.   formoterol  (PERFOROMIST ) 20 MCG/2ML nebulizer solution Take 2 mLs (20 mcg total) by nebulization 2 (two) times daily.   metoCLOPramide  (REGLAN ) 5 MG tablet Take 1 tablet (5 mg total) by mouth every 6 (six) hours as needed for nausea.   metoprolol  succinate (TOPROL -XL) 25 MG 24 hr tablet Take 0.5 tablets (12.5 mg total) by mouth daily.   Misc. Devices (TRANSPORT CHAIR) MISC For transporting to and from Doctors appointments   montelukast  (SINGULAIR ) 10 MG tablet Take 1 tablet (10 mg total) by mouth at bedtime.   nitroGLYCERIN  (NITROSTAT ) 0.4 MG SL tablet Place 1 tablet (0.4 mg  total) under the tongue every 5 (five) minutes as needed for chest pain.   nystatin ointment (MYCOSTATIN) APPLY AROUND TRACH SITE TWICE DAILY FOR 2 WEEKS. MIX WITH TRIAMCINOLONE .   ondansetron  (ZOFRAN ) 4 MG tablet TAKE (1) TABLET BY MOUTH EVERY EIGHT HOURS AS NEEDED.   oxyCODONE  (OXYCONTIN ) 15 mg 12 hr tablet Take 1 tablet (15 mg total) by mouth every 12 (twelve) hours. Do Not fill before 09/18/2023   oxyCODONE  (OXYCONTIN ) 15 mg 12 hr tablet Take 1 tablet (15 mg total) by mouth every 12 (twelve) hours. Do Not fill before 09/17/2023   oxyCODONE  (ROXICODONE ) 5 MG immediate release tablet Take 1 tablet (5 mg total) by mouth every 4 (four) hours as needed for severe pain (pain score 7-10).   [START ON 10/09/2023] oxyCODONE  (ROXICODONE ) 5 MG immediate release tablet Take 1 tablet (5 mg total) by mouth every 4 (four) hours as needed for severe pain (pain score 7-10).   OXYGEN Inhale 3 L into the lungs continuous.   pantoprazole  (PROTONIX ) 40 MG tablet Take 1 tablet (40 mg total) by mouth daily.   polyethylene glycol powder (GOODSENSE CLEARLAX) 17 GM/SCOOP powder Take 17 g by mouth 2 (two) to 3 (three) times daily as needed.   QUEtiapine  (SEROQUEL ) 300 MG tablet Take 0.5 tablets (150 mg total) by mouth 2 (two) times daily AND 1 tablet (300 mg total) at  bedtime.   rosuvastatin  (CRESTOR ) 5 MG tablet Take 1 tablet (5 mg total) by mouth daily.   senna (SENOKOT) 8.6 MG tablet Take 1 tablet by mouth at bedtime.   tamsulosin  (FLOMAX ) 0.4 MG CAPS capsule Take 1 capsule (0.4 mg total) by mouth 2 (two) times daily.   terazosin  (HYTRIN ) 5 MG capsule Take 1 capsule (5 mg total) by mouth at bedtime.   triamcinolone  ointment (KENALOG ) 0.1 % APPLY AROUND TRACH SITE TWICE DAILY FOR 2 WEEKS. MIX WITH NYSTATIN.   UNABLE TO FIND 1 each by Does not apply route daily. Med Name: LIFT CHAIR   UNABLE TO FIND 1 each by Does not apply route daily. Med Name: BATHROOM RENOVATION FOR WALK IN SHOWER   VENTOLIN  HFA 108 (90 Base) MCG/ACT  inhaler Inhale 1-2 puffs into the lungs every 4 (four) hours as needed FOR WHEEZING OR SHORTNESS OF BREATH             Past Medical History:  Diagnosis Date   Acute respiratory failure with hypoxia (HCC) 11/04/2019   Anxiety    Arthritis    COPD (chronic obstructive pulmonary disease) (HCC)    COPD with acute exacerbation (HCC) 11/04/2019   Coronary artery disease    Depression    Emphysema of lung (HCC)    Hypertension    Myocardial infarct (HCC)    Oxygen deficiency       Objective:   Wts  10/07/2023      198  03/31/2023    189  12/26/2022        189  09/09/2022      197  03/12/2022    203  12/18/2021        194  06/19/2021        182 12/24/2020        181  05/31/20 194 lb 12.8 oz (88.4 kg)  03/06/20 186 lb (84.4 kg)  01/27/20 186 lb (84.4 kg)   Vital signs reviewed  10/07/2023  - Note at rest 02 sats  94% on 3lpm cont    General appearance:    hoarse amb mod obese wm nad / very raspy upper airway noise/ min insp /exp rhonchi    HEENT : Oropharynx  clear       NECK :  without  apparent JVD/ palpable Nodes/TM / trach in place, ostia looks good    LUNGS: no acc muscle use,  Min barrel  contour chest wall with bilateral mild insp/exp rhonchi and  without cough on insp or exp maneuvers and min  Hyperresonant  to  percussion bilaterally    CV:  RRR  no s3 or murmur or increase in P2, and no edema   ABD:  soft and nontender with pos end  insp Hoover's  in the supine position.  No bruits or organomegaly appreciated   MS:  Nl gait/ ext warm without deformities Or obvious joint restrictions  calf tenderness, cyanosis or clubbing     SKIN: warm and dry without lesions    NEURO:  alert, approp, nl sensorium with  no motor or cerebellar deficits apparent.             I personally reviewed images and agree with radiology impression as follows:   Chest LDSCT     09/15/23 1. Lung-RADS 2, benign appearance or behavior. Continue annual screening with low-dose chest CT without  contrast in 12 months. 2. Coronary artery calcifications. 3. Moderate to severe emphysema    Assessment

## 2023-10-05 ENCOUNTER — Other Ambulatory Visit: Payer: Self-pay

## 2023-10-05 ENCOUNTER — Other Ambulatory Visit (HOSPITAL_COMMUNITY): Payer: Self-pay

## 2023-10-06 ENCOUNTER — Other Ambulatory Visit: Payer: Self-pay

## 2023-10-06 ENCOUNTER — Other Ambulatory Visit (HOSPITAL_COMMUNITY): Payer: Self-pay

## 2023-10-07 ENCOUNTER — Other Ambulatory Visit (HOSPITAL_COMMUNITY): Payer: Self-pay

## 2023-10-07 ENCOUNTER — Other Ambulatory Visit: Payer: Self-pay

## 2023-10-07 ENCOUNTER — Encounter: Payer: Self-pay | Admitting: Internal Medicine

## 2023-10-07 ENCOUNTER — Ambulatory Visit (INDEPENDENT_AMBULATORY_CARE_PROVIDER_SITE_OTHER): Admitting: Internal Medicine

## 2023-10-07 VITALS — BP 127/86 | HR 90 | Ht 67.0 in | Wt 198.6 lb

## 2023-10-07 DIAGNOSIS — J449 Chronic obstructive pulmonary disease, unspecified: Secondary | ICD-10-CM

## 2023-10-07 DIAGNOSIS — Z87891 Personal history of nicotine dependence: Secondary | ICD-10-CM

## 2023-10-07 DIAGNOSIS — J9611 Chronic respiratory failure with hypoxia: Secondary | ICD-10-CM

## 2023-10-07 MED ORDER — CEFDINIR 300 MG PO CAPS
300.0000 mg | ORAL_CAPSULE | Freq: Two times a day (BID) | ORAL | 0 refills | Status: DC
  Filled 2023-10-07: qty 20, 10d supply, fill #0

## 2023-10-07 NOTE — Patient Instructions (Addendum)
 Omnicef 300 mg twice daily x 10days  For cough/ congestion > mucinex  or mucinex  dm  up to maximum of  1200 mg every 12 hours  as needed  Make sure you check your oxygen saturation  AT  your highest level of activity (not after you stop)   to be sure it stays over 90% and adjust  02 flow upward to maintain this level if needed but remember to turn it back to previous settings when you stop (to conserve your supply).   Please schedule a follow up visit in 6  months but call sooner if needed

## 2023-10-09 ENCOUNTER — Other Ambulatory Visit: Payer: Self-pay | Admitting: Registered Nurse

## 2023-10-09 ENCOUNTER — Other Ambulatory Visit (HOSPITAL_COMMUNITY): Payer: Self-pay

## 2023-10-10 NOTE — Assessment & Plan Note (Addendum)
 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 Patient wanted to stop walk after 172ft due to SOB and being unsteady with lowest sats 94%  - 03/31/2023   Walked on 2lpm cont  x  2  lap(s) =  approx 300  ft  @ mod pace, stopped due to fatigue > sob  with lowest 02 sats 90%    Despite flare of AB he is saturating wll on 3lpm per trach collar   Rec Make sure you check your oxygen saturation  AT  your highest level of activity (not after you stop)   to be sure it stays over 90% and adjust  02 flow upward to maintain this level if needed but remember to turn it back to previous settings when you stop (to conserve your supply).   F/u q 6 m - call sooner if needed         Each maintenance medication was reviewed in detail including emphasizing most importantly the difference between maintenance and prns and under what circumstances the prns are to be triggered using an action plan format where appropriate.  Total time for H and P, chart review, counseling, reviewing 02/ pulse ox /hfa/ neb  device(s) and generating customized AVS unique to this office visit / same day charting = 23 min

## 2023-10-10 NOTE — Assessment & Plan Note (Signed)
 Quit smoking 08/2020/ MM - 01/24/2020  After extensive coaching inhaler device,  effectiveness =    75% (short Ti) > change symb 80 to 160 2bid  -  PFT's   05/01/20   FEV1 0.97 (27 % ) ratio 0.30  p 1 % improvement from saba p symbicort  prior to study with DLCO  11.59 (43%) corrects to 2.0 (45%)  for alv volume and FV curve severe airflow obst with 35% improvement in FVC p saba  - 05/31/2020  After extensive coaching inhaler device,  effectiveness =    Short Ti, prostate problems so rx symbicort  160 2bid and prn saba -  05/31/2020   Walked RA  approx   300 ft  @ moderate pace  stopped due to  Sob at end but sats still 94%    - Labs ordered 12/24/2020  :    alpha one AT phenotype  MM level 154  -  Chest LDSCT     09/15/23 Moderate to severe emphysema    Mild flare aecopd >>> rx omnicef 300 mg bid x 10 days with color of mucus to determine efficacy  For cough/ congestion > mucinex  or mucinex  dm  up to maximum of  1200 mg every 12 hours as needed

## 2023-10-13 ENCOUNTER — Other Ambulatory Visit: Payer: Self-pay

## 2023-10-13 ENCOUNTER — Other Ambulatory Visit (HOSPITAL_COMMUNITY): Payer: Self-pay

## 2023-10-14 ENCOUNTER — Other Ambulatory Visit: Payer: Self-pay | Admitting: Registered Nurse

## 2023-10-14 ENCOUNTER — Telehealth: Payer: Self-pay | Admitting: Registered Nurse

## 2023-10-14 ENCOUNTER — Other Ambulatory Visit (HOSPITAL_COMMUNITY): Payer: Self-pay

## 2023-10-14 MED ORDER — CLONAZEPAM 0.5 MG PO TABS
0.5000 mg | ORAL_TABLET | Freq: Every day | ORAL | 2 refills | Status: DC
  Filled 2023-10-14: qty 30, 30d supply, fill #0
  Filled 2023-11-23: qty 30, 30d supply, fill #1

## 2023-10-14 NOTE — Telephone Encounter (Signed)
 PMP was Reviewed.  Clonazepam  e-scribed to pharmacy.  Sister is aware.

## 2023-10-15 ENCOUNTER — Other Ambulatory Visit (HOSPITAL_COMMUNITY): Payer: Self-pay

## 2023-10-15 ENCOUNTER — Other Ambulatory Visit: Payer: Self-pay

## 2023-10-16 ENCOUNTER — Other Ambulatory Visit (HOSPITAL_COMMUNITY): Payer: Self-pay

## 2023-10-16 ENCOUNTER — Other Ambulatory Visit: Payer: Self-pay

## 2023-10-16 ENCOUNTER — Other Ambulatory Visit (HOSPITAL_BASED_OUTPATIENT_CLINIC_OR_DEPARTMENT_OTHER): Payer: Self-pay

## 2023-10-19 ENCOUNTER — Other Ambulatory Visit: Payer: Self-pay

## 2023-10-21 ENCOUNTER — Ambulatory Visit: Admitting: Internal Medicine

## 2023-10-21 ENCOUNTER — Encounter: Payer: Self-pay | Admitting: Internal Medicine

## 2023-10-21 VITALS — BP 101/72 | HR 86 | Ht 68.0 in | Wt 199.4 lb

## 2023-10-21 DIAGNOSIS — J9611 Chronic respiratory failure with hypoxia: Secondary | ICD-10-CM

## 2023-10-21 DIAGNOSIS — N401 Enlarged prostate with lower urinary tract symptoms: Secondary | ICD-10-CM | POA: Diagnosis not present

## 2023-10-21 DIAGNOSIS — R338 Other retention of urine: Secondary | ICD-10-CM

## 2023-10-21 DIAGNOSIS — E66811 Obesity, class 1: Secondary | ICD-10-CM

## 2023-10-21 DIAGNOSIS — K219 Gastro-esophageal reflux disease without esophagitis: Secondary | ICD-10-CM

## 2023-10-21 DIAGNOSIS — J449 Chronic obstructive pulmonary disease, unspecified: Secondary | ICD-10-CM | POA: Diagnosis not present

## 2023-10-21 DIAGNOSIS — E1165 Type 2 diabetes mellitus with hyperglycemia: Secondary | ICD-10-CM | POA: Diagnosis not present

## 2023-10-21 DIAGNOSIS — E782 Mixed hyperlipidemia: Secondary | ICD-10-CM | POA: Diagnosis not present

## 2023-10-21 MED ORDER — AZITHROMYCIN 250 MG PO TABS: ORAL_TABLET | ORAL | 0 refills | Status: DC

## 2023-10-21 NOTE — Assessment & Plan Note (Signed)
 He is currently prescribed rosuvastatin  5 mg daily.  Repeat lipid panel ordered today.

## 2023-10-21 NOTE — Patient Instructions (Signed)
 It was a pleasure to see you today.  Thank you for giving us  the opportunity to be involved in your care.  Below is a brief recap of your visit and next steps.  We will plan to see you again in 3 months.  Summary Z pak added today for COPD with increase sputum production Repeat labs ordered Follow up in 3 months

## 2023-10-21 NOTE — Assessment & Plan Note (Signed)
 Diet controlled.  A1c 6.0 on labs from December 2024.  Repeat A1c ordered today.

## 2023-10-21 NOTE — Progress Notes (Signed)
 Established Patient Office Visit  Subjective   Patient ID: Charles Weeks, male    DOB: 17-Aug-1965  Age: 58 y.o. MRN: [id number]  Chief Complaint  Patient presents with   Care Management    Three month follow up    Charles Weeks returns to care today for routine follow-up.  He was last evaluated by me for follow-up on 3/4 through video encounter.  His acute concern at that time was increase straining with urination despite taking his medications as prescribed.  I placed a referral to urology for further evaluation and management.  No medication changes were made and 7-month follow-up was arranged for reassessment.  In the interim, he has established care with urology.  He has also been seen by pulmonology and PM&R.  Today he reports feeling fairly well but endorses continued cough with increased sputum production.  He does not have any acute concerns to discuss.  Past Medical History:  Diagnosis Date   Anxiety 01/19/2020   Arthritis    Asthma    Benign prostatic hyperplasia with weak urinary stream 12/08/2019   BPH (benign prostatic hyperplasia) 08/17/2020   Chronic respiratory failure with hypoxia (HCC) 01/24/2020   Has home 02 but not using as of 01/24/2020  -  01/24/2020   Walked RA  approx   200 ft  @ slow pace  stopped due to  Dizzy with sats still 95%      Cigarette smoker 01/24/2020   Stopped regular smoking 10/2019 but still "now and then" as of 01/24/2020    COPD (chronic obstructive pulmonary disease) (HCC) 08/17/2020   gold   COPD GOLD ?     Quit smoking 10/2019  - Labs ordered 01/24/2020  :  allergy profile   alpha one AT phenotype   - 01/24/2020  After extensive coaching inhaler device,  effectiveness =    75% (short Ti) > change symb 80 to 160 2bid      Coronary artery disease 06/08/2018   DES to RCA, [facility], [place])   Depression, major, single episode, moderate (HCC) 01/19/2020   Dyspnea    Encounter for screening for malignant neoplasm of colon 12/08/2019    Essential hypertension 12/08/2019   History of MI (myocardial infarction) 01/19/2020   HTN (hypertension) 08/17/2020   Myocardial infarct (HCC) 2019   Pneumonia    Positive colorectal cancer screening using Cologuard test    Past Surgical History:  Procedure Laterality Date   BALLOON DILATION N/A 02/06/2021   Procedure: BALLOON DILATION;  Surgeon: Virgina Grills, MD;  Location: [facility] OR;  Service: ENT;  Laterality: N/A;   BOWEL RESECTION  2006   COLONOSCOPY  03/2022   [facility]   MICROLARYNGOSCOPY WITH LASER AND BALLOON DILATION N/A 02/06/2021   Procedure: SUSPENDED MICRO DIRECT LARYNGOSCOPY;  Surgeon: Virgina Grills, MD;  Location: [facility] OR;  Service: ENT;  Laterality: N/A;   PEG PLACEMENT N/A 09/03/2020   Procedure: PERCUTANEOUS ENDOSCOPIC GASTROSTOMY (PEG) PLACEMENT;  Surgeon: Dorena Gander, MD;  Location: [facility] OR;  Service: General;  Laterality: N/A;   PERCUTANEOUS TRACHEOSTOMY N/A 09/03/2020   Procedure: PERCUTANEOUS TRACHEOSTOMY USING 6 SHILEY;  Surgeon: Dorena Gander, MD;  Location: [facility] OR;  Service: General;  Laterality: N/A;   SKIN GRAFT     TONSILLECTOMY AND ADENOIDECTOMY     Social History   Tobacco Use   Smoking status: Former    Current packs/day: 0.00    Types: Cigarettes    Quit date: 08/17/2020    Years since quitting:  3.1   Smokeless tobacco: Never  Vaping Use   Vaping status: Never Used  Substance Use Topics   Alcohol use: Not Currently   Drug use: Not Currently   Family History  Problem Relation Age of Onset   Heart disease Mother    Heart disease Father    Diabetes Sister    Allergies  Allergen Reactions   Ciprofloxacin  Hives   Review of Systems  Constitutional:  Negative for chills and fever.  HENT:  Negative for sore throat.   Respiratory:  Positive for cough and sputum production. Negative for shortness of breath.   Cardiovascular:  Negative for chest pain, palpitations and leg swelling.  Gastrointestinal:  Negative for abdominal pain, blood in stool,  constipation, diarrhea, nausea and vomiting.  Genitourinary:  Negative for dysuria and hematuria.  Musculoskeletal:  Negative for myalgias.  Skin:  Negative for itching and rash.  Neurological:  Negative for dizziness and headaches.  Psychiatric/Behavioral:  Negative for depression and suicidal ideas.      Objective:     BP 101/72   Pulse 86   Ht 5\' 8"  (1.727 m)   Wt 199 lb 6.4 oz (90.4 kg)   SpO2 96%   BMI 30.32 kg/m  BP Readings from Last 3 Encounters:  10/21/23 101/72  10/07/23 127/86  08/24/23 115/78   Physical Exam Vitals reviewed.  Constitutional:      General: He is not in acute distress.    Appearance: Normal appearance. He is obese. He is not ill-appearing.  HENT:     Head: Normocephalic and atraumatic.     Right Ear: External ear normal.     Left Ear: External ear normal.     Nose: Nose normal. No congestion or rhinorrhea.     Mouth/Throat:     Mouth: Mucous membranes are moist.     Pharynx: Oropharynx is clear.  Eyes:     General: No scleral icterus.    Extraocular Movements: Extraocular movements intact.     Conjunctiva/sclera: Conjunctivae normal.     Pupils: Pupils are equal, round, and reactive to light.  Cardiovascular:     Rate and Rhythm: Normal rate and regular rhythm.     Pulses: Normal pulses.     Heart sounds: Normal heart sounds. No murmur heard. Pulmonary:     Effort: Pulmonary effort is normal.     Breath sounds: Normal breath sounds. No wheezing, rhonchi or rales.     Comments: Trach in place.  Examined on 3 L [place] Abdominal:     General: Abdomen is flat. Bowel sounds are normal. There is no distension.     Palpations: Abdomen is soft.     Tenderness: There is no abdominal tenderness.  Musculoskeletal:        General: No swelling or deformity. Normal range of motion.     Cervical back: Normal range of motion.  Skin:    General: Skin is warm and dry.     Capillary Refill: Capillary refill takes less than 2 seconds.  Neurological:      General: No focal deficit present.     Mental Status: He is alert and oriented to person, place, and time.     Motor: No weakness.  Psychiatric:        Mood and Affect: Mood normal.        Behavior: Behavior normal.        Thought Content: Thought content normal.   Last CBC Lab Results  Component Value Date  WBC 7.1 10/23/2022   HGB 14.0 10/23/2022   HCT 42.6 10/23/2022   MCV 91 10/23/2022   MCH 29.8 10/23/2022   RDW 12.7 10/23/2022   PLT 214 10/23/2022   Last metabolic panel Lab Results  Component Value Date   GLUCOSE 131 (H) 10/23/2022   NA 143 10/23/2022   K 4.2 10/23/2022   CL 100 10/23/2022   CO2 28 10/23/2022   BUN 8 10/23/2022   CREATININE 0.91 10/23/2022   EGFR 98 10/23/2022   CALCIUM  9.6 10/23/2022   PHOS 4.2 11/28/2021   PROT 6.5 10/23/2022   ALBUMIN  4.4 10/23/2022   LABGLOB 2.1 10/23/2022   AGRATIO 2.1 10/23/2022   BILITOT 0.3 10/23/2022   ALKPHOS 117 10/23/2022   AST 19 10/23/2022   ALT 20 10/23/2022   ANIONGAP 4 (L) 11/28/2021   Last lipids Lab Results  Component Value Date   CHOL 137 03/05/2022   HDL 42 03/05/2022   LDLCALC 35 03/05/2022   TRIG 298 (H) 03/05/2022   CHOLHDL 3.3 03/05/2022   Last hemoglobin A1c Lab Results  Component Value Date   HGBA1C 6.0 (H) 04/21/2023   Last thyroid functions Lab Results  Component Value Date   TSH 2.590 10/23/2022     Assessment & Plan:   Problem List Items Addressed This Visit       COPD GOLD 4/ 02 dep  - Primary   Recently seen by pulmonology for follow-up.  He uses an albuterol  nebulizer as needed.  His acute concern today is increased cough and sputum production.  Will add Z-Pak due to concern for acute bronchitis.      Type 2 diabetes mellitus with hyperglycemia (HCC)   Diet controlled.  A1c 6.0 on labs from December 2024.  Repeat A1c ordered today.      Urinary retention due to benign prostatic hyperplasia   Previously referred to urology in the setting of increased straining with  urination with known history of BPH.  Evaluated by urology in early April.  Finasteride  was added.  He is additionally prescribed tamsulosin , terazosin , and bethanechol .      HLD (hyperlipidemia)   He is currently prescribed rosuvastatin  5 mg daily.  Repeat lipid panel ordered today.      Return in about 3 months (around 01/21/2024).   Tobi Fortes, MD

## 2023-10-21 NOTE — Assessment & Plan Note (Signed)
 Recently seen by pulmonology for follow-up.  He uses an albuterol  nebulizer as needed.  His acute concern today is increased cough and sputum production.  Will add Z-Pak due to concern for acute bronchitis.

## 2023-10-21 NOTE — Assessment & Plan Note (Signed)
 Previously referred to urology in the setting of increased straining with urination with known history of BPH.  Evaluated by urology in early April.  Finasteride  was added.  He is additionally prescribed tamsulosin , terazosin , and bethanechol .

## 2023-10-22 ENCOUNTER — Other Ambulatory Visit: Payer: Self-pay | Admitting: Internal Medicine

## 2023-10-22 ENCOUNTER — Ambulatory Visit: Payer: Self-pay | Admitting: Internal Medicine

## 2023-10-22 ENCOUNTER — Ambulatory Visit: Admitting: Registered Nurse

## 2023-10-22 ENCOUNTER — Other Ambulatory Visit (HOSPITAL_COMMUNITY): Payer: Self-pay

## 2023-10-22 DIAGNOSIS — E782 Mixed hyperlipidemia: Secondary | ICD-10-CM

## 2023-10-22 LAB — CBC WITH DIFFERENTIAL/PLATELET
Basophils Absolute: 0 10*3/uL (ref 0.0–0.2)
Basos: 0 %
EOS (ABSOLUTE): 0.4 10*3/uL (ref 0.0–0.4)
Eos: 5 %
Hematocrit: 44.4 % (ref 37.5–51.0)
Hemoglobin: 14.4 g/dL (ref 13.0–17.7)
Immature Grans (Abs): 0.1 10*3/uL (ref 0.0–0.1)
Immature Granulocytes: 1 %
Lymphocytes Absolute: 1.7 10*3/uL (ref 0.7–3.1)
Lymphs: 22 %
MCH: 29.8 pg (ref 26.6–33.0)
MCHC: 32.4 g/dL (ref 31.5–35.7)
MCV: 92 fL (ref 79–97)
Monocytes Absolute: 0.7 10*3/uL (ref 0.1–0.9)
Monocytes: 9 %
Neutrophils Absolute: 4.8 10*3/uL (ref 1.4–7.0)
Neutrophils: 63 %
Platelets: 217 10*3/uL (ref 150–450)
RBC: 4.84 x10E6/uL (ref 4.14–5.80)
RDW: 12.9 % (ref 11.6–15.4)
WBC: 7.6 10*3/uL (ref 3.4–10.8)

## 2023-10-22 LAB — B12 AND FOLATE PANEL
Folate: >20 ng/mL (ref >3.0)
Vitamin B-12: 380 pg/mL (ref 232–1245)

## 2023-10-22 LAB — CMP14+EGFR
ALT: 27 IU/L (ref 0–44)
AST: 18 IU/L (ref 0–40)
Albumin: 4.6 g/dL (ref 3.8–4.9)
Alkaline Phosphatase: 156 IU/L — ABNORMAL HIGH (ref 44–121)
BUN/Creatinine Ratio: 8 — ABNORMAL LOW (ref 9–20)
BUN: 8 mg/dL (ref 6–24)
Bilirubin Total: 0.3 mg/dL (ref 0.0–1.2)
CO2: 26 mmol/L (ref 20–29)
Calcium: 9.7 mg/dL (ref 8.7–10.2)
Chloride: 100 mmol/L (ref 96–106)
Creatinine, Ser: 1.01 mg/dL (ref 0.76–1.27)
Globulin, Total: 2.3 g/dL (ref 1.5–4.5)
Glucose: 138 mg/dL — ABNORMAL HIGH (ref 70–99)
Potassium: 4.4 mmol/L (ref 3.5–5.2)
Sodium: 142 mmol/L (ref 134–144)
Total Protein: 6.9 g/dL (ref 6.0–8.5)
eGFR: 86 mL/min/{1.73_m2} (ref >59)

## 2023-10-22 LAB — LIPID PANEL
Chol/HDL Ratio: 4.3 ratio (ref 0.0–5.0)
Cholesterol, Total: 178 mg/dL (ref 100–199)
HDL: 41 mg/dL (ref >39)
LDL Chol Calc (NIH): 92 mg/dL (ref 0–99)
Triglycerides: 266 mg/dL — ABNORMAL HIGH (ref 0–149)
VLDL Cholesterol Cal: 45 mg/dL — ABNORMAL HIGH (ref 5–40)

## 2023-10-22 LAB — VITAMIN D 25 HYDROXY (VIT D DEFICIENCY, FRACTURES): Vit D, 25-Hydroxy: 56 ng/mL (ref 30.0–100.0)

## 2023-10-22 LAB — HEMOGLOBIN A1C
Est. average glucose Bld gHb Est-mCnc: 140 mg/dL
Hgb A1c MFr Bld: 6.5 % — ABNORMAL HIGH (ref 4.8–5.6)

## 2023-10-22 LAB — TSH+FREE T4
Free T4: 0.88 ng/dL (ref 0.82–1.77)
TSH: 2.57 u[IU]/mL (ref 0.450–4.500)

## 2023-10-22 MED ORDER — ROSUVASTATIN CALCIUM 10 MG PO TABS
10.0000 mg | ORAL_TABLET | Freq: Every day | ORAL | 3 refills | Status: AC
  Filled 2023-11-16: qty 90, 90d supply, fill #0
  Filled 2024-02-08: qty 90, 90d supply, fill #1
  Filled 2024-06-03: qty 90, 90d supply, fill #2

## 2023-10-23 ENCOUNTER — Other Ambulatory Visit (HOSPITAL_COMMUNITY): Payer: Self-pay

## 2023-10-23 ENCOUNTER — Other Ambulatory Visit: Payer: Self-pay

## 2023-10-23 ENCOUNTER — Encounter: Attending: Physical Medicine & Rehabilitation | Admitting: Registered Nurse

## 2023-10-23 ENCOUNTER — Encounter: Payer: Self-pay | Admitting: Registered Nurse

## 2023-10-23 VITALS — BP 107/79 | HR 85 | Ht 68.0 in | Wt 199.8 lb

## 2023-10-23 DIAGNOSIS — M546 Pain in thoracic spine: Secondary | ICD-10-CM | POA: Diagnosis present

## 2023-10-23 DIAGNOSIS — Z5181 Encounter for therapeutic drug level monitoring: Secondary | ICD-10-CM | POA: Diagnosis not present

## 2023-10-23 DIAGNOSIS — G894 Chronic pain syndrome: Secondary | ICD-10-CM | POA: Insufficient documentation

## 2023-10-23 DIAGNOSIS — M545 Low back pain, unspecified: Secondary | ICD-10-CM | POA: Insufficient documentation

## 2023-10-23 DIAGNOSIS — Z79899 Other long term (current) drug therapy: Secondary | ICD-10-CM | POA: Insufficient documentation

## 2023-10-23 DIAGNOSIS — G8929 Other chronic pain: Secondary | ICD-10-CM | POA: Insufficient documentation

## 2023-10-23 MED ORDER — OXYCODONE HCL 5 MG PO TABS
5.0000 mg | ORAL_TABLET | ORAL | 0 refills | Status: DC | PRN
  Filled 2023-10-23 – 2023-11-10 (×2): qty 120, 20d supply, fill #0

## 2023-10-23 MED ORDER — OXYCODONE HCL ER 15 MG PO T12A
15.0000 mg | EXTENDED_RELEASE_TABLET | Freq: Two times a day (BID) | ORAL | 0 refills | Status: DC
  Filled 2023-10-23: qty 60, 30d supply, fill #0

## 2023-10-23 NOTE — Progress Notes (Signed)
 Subjective:    Patient ID: Charles Weeks, male    DOB: 07-24-1965, 58 y.o.   MRN: [id number]  HPI: Charles Weeks is a 58 y.o. male who returns for follow up appointment for chronic pain and medication refill. He states his pain is located in his mid- lower back. He rates his pain 9. His current exercise regime is walking and performing stretching exercises.  Charles Weeks Morphine  equivalent is 90.00 MME.  He is also prescribed Clonazepam  .We have discussed the black box warning of using opioids and benzodiazepines. I highlighted the dangers of using these drugs together and discussed the adverse events including respiratory suppression, overdose, cognitive impairment and importance of compliance with current regimen. We will continue to monitor and adjust as indicated.  .    Last Oral Swab was Performed on 06/25/2023, it was consistent.      Pain Inventory Average Pain 8 Pain Right Now 9 My pain is constant, sharp, burning, dull, stabbing, tingling, and aching  In the last 24 hours, has pain interfered with the following? General activity 9 Relation with others 6 Enjoyment of life 0 What TIME of day is your pain at its worst? morning  and night Sleep (in general) Poor  Pain is worse with: walking, bending, sitting, standing, and some activites Pain improves with: heat/ice and medication Relief from Meds: 7  Family History  Problem Relation Age of Onset   Heart disease Mother    Heart disease Father    Diabetes Sister    Social History   Socioeconomic History   Marital status: Divorced    Spouse name: Not on file   Number of children: 2   Years of education: Not on file   Highest education level: 7th grade  Occupational History   Not on file  Tobacco Use   Smoking status: Former    Current packs/day: 0.00    Types: Cigarettes    Quit date: 08/17/2020    Years since quitting: 3.1   Smokeless tobacco: Never  Vaping Use   Vaping status: Never Used  Substance and  Sexual Activity   Alcohol use: Not Currently   Drug use: Not Currently   Sexual activity: Not on file  Other Topics Concern   Not on file  Social History Narrative   ** Merged History Encounter **       Lives with sister  Both daughters live close by   Sister has dog   Enjoys: shooting pool, fishing, Therapist, nutritional  Diet: eats all food groups Caffeine: pop-dr pepper 2 liter  Water : 4-6 cups   Wears seat belt  No driving for him Psychologist, sport and exercise  Fi   re Extinguisher No weapons        Social Drivers of Corporate investment banker Strain: Low Risk  (04/21/2023)   Overall Financial Resource Strain (CARDIA)    Difficulty of Paying Living Expenses: Not hard at all  Food Insecurity: No Food Insecurity (04/21/2023)   Hunger Vital Sign    Worried About Running Out of Food in the Last Year: Never true    Ran Out of Food in the Last Year: Never true  Transportation Needs: No Transportation Needs (04/21/2023)   PRAPARE - Administrator, Civil Service (Medical): No    Lack of Transportation (Non-Medical): No  Physical Activity: Unknown (04/21/2023)   Exercise Vital Sign    Days of Exercise per Week: 0 days    Minutes of Exercise per Session: Not on file  Stress: No Stress Concern Present (04/21/2023)   [employer] of Occupational Health - Occupational Stress Questionnaire    Feeling of Stress : Only a little  Social Connections: Socially Isolated (04/21/2023)   Social Connection and Isolation Panel [NHANES]    Frequency of Communication with Friends and Family: More than three times a week    Frequency of Social Gatherings with Friends and Family: Once a week    Attends Religious Services: Never    Database administrator or Organizations: No    Attends Engineer, structural: Not on file    Marital Status: Divorced   Past Surgical History:  Procedure Laterality Date   BALLOON DILATION N/A 02/06/2021   Procedure: BALLOON DILATION;  Surgeon: Virgina Grills, MD;   Location: [facility] OR;  Service: ENT;  Laterality: N/A;   BOWEL RESECTION  2006   COLONOSCOPY  03/2022   [facility]   MICROLARYNGOSCOPY WITH LASER AND BALLOON DILATION N/A 02/06/2021   Procedure: SUSPENDED MICRO DIRECT LARYNGOSCOPY;  Surgeon: Virgina Grills, MD;  Location: [facility] OR;  Service: ENT;  Laterality: N/A;   PEG PLACEMENT N/A 09/03/2020   Procedure: PERCUTANEOUS ENDOSCOPIC GASTROSTOMY (PEG) PLACEMENT;  Surgeon: Dorena Gander, MD;  Location: [facility] OR;  Service: General;  Laterality: N/A;   PERCUTANEOUS TRACHEOSTOMY N/A 09/03/2020   Procedure: PERCUTANEOUS TRACHEOSTOMY USING 6 SHILEY;  Surgeon: Dorena Gander, MD;  Location: [facility] OR;  Service: General;  Laterality: N/A;   SKIN GRAFT     TONSILLECTOMY AND ADENOIDECTOMY     Past Surgical History:  Procedure Laterality Date   BALLOON DILATION N/A 02/06/2021   Procedure: BALLOON DILATION;  Surgeon: Virgina Grills, MD;  Location: [facility] OR;  Service: ENT;  Laterality: N/A;   BOWEL RESECTION  2006   COLONOSCOPY  03/2022   [facility]   MICROLARYNGOSCOPY WITH LASER AND BALLOON DILATION N/A 02/06/2021   Procedure: SUSPENDED MICRO DIRECT LARYNGOSCOPY;  Surgeon: Virgina Grills, MD;  Location: [facility] OR;  Service: ENT;  Laterality: N/A;   PEG PLACEMENT N/A 09/03/2020   Procedure: PERCUTANEOUS ENDOSCOPIC GASTROSTOMY (PEG) PLACEMENT;  Surgeon: Dorena Gander, MD;  Location: [facility] OR;  Service: General;  Laterality: N/A;   PERCUTANEOUS TRACHEOSTOMY N/A 09/03/2020   Procedure: PERCUTANEOUS TRACHEOSTOMY USING 6 SHILEY;  Surgeon: Dorena Gander, MD;  Location: [facility] OR;  Service: General;  Laterality: N/A;   SKIN GRAFT     TONSILLECTOMY AND ADENOIDECTOMY     Past Medical History:  Diagnosis Date   Anxiety 01/19/2020   Arthritis    Asthma    Benign prostatic hyperplasia with weak urinary stream 12/08/2019   BPH (benign prostatic hyperplasia) 08/17/2020   Chronic respiratory failure with hypoxia (HCC) 01/24/2020   Has home 02 but not using as of 01/24/2020  -  01/24/2020   Walked RA   approx   200 ft  @ slow pace  stopped due to  Dizzy with sats still 95%      Cigarette smoker 01/24/2020   Stopped regular smoking 10/2019 but still now and then as of 01/24/2020    COPD (chronic obstructive pulmonary disease) (HCC) 08/17/2020   gold   COPD GOLD ?     Quit smoking 10/2019  - Labs ordered 01/24/2020  :  allergy profile   alpha one AT phenotype   - 01/24/2020  After extensive coaching inhaler device,  effectiveness =    75% (short Ti) > change symb 80 to 160 2bid      Coronary artery disease 06/08/2018   DES to RCA, [facility] [place], [place])   Depression, major, single episode, moderate (HCC) 01/19/2020   Dyspnea    Encounter for screening for malignant neoplasm of colon 12/08/2019   Essential hypertension 12/08/2019   History of MI (myocardial infarction) 01/19/2020   HTN (hypertension) 08/17/2020   Myocardial infarct (HCC) 2019   Pneumonia    Positive colorectal cancer screening using Cologuard test    BP 107/79 (BP Location: Left Arm, Patient Position: Sitting)   Pulse 85   Ht 5' 8 (1.727 m)   Wt 199 lb 12.8 oz (90.6 kg)   SpO2 93%   BMI 30.38 kg/m   Opioid Risk Score:   Fall Risk Score:  `1  Depression screen PHQ 2/9     10/23/2023    1:43 PM 10/21/2023    1:46 PM 08/18/2023    1:18 PM 06/25/2023    1:21 PM 04/21/2023    1:10 PM 02/18/2023    1:13 PM 01/16/2023    1:01 PM  Depression screen PHQ 2/9  Decreased Interest 0 1 2 2  0 0 2  Down, Depressed, Hopeless 0 1 2 2  0 0 2  PHQ - 2 Score 0 2 4 4  0 0 4  Altered sleeping 0 1       Tired, decreased energy 0 1       Change in appetite 0 0       Feeling bad or failure about yourself  0 1       Trouble concentrating 0 1       Moving slowly or fidgety/restless 0 0       Suicidal thoughts 0 0       PHQ-9 Score 0 6       Difficult doing work/chores Not difficult at all Somewhat difficult          Review of Systems  All other systems reviewed and are negative.      Objective:   Physical Exam Vitals  and nursing note reviewed.  Constitutional:      Appearance: Normal appearance.   Cardiovascular:     Rate and Rhythm: Normal rate and regular rhythm.     Pulses: Normal pulses.     Heart sounds: Normal heart sounds.  Pulmonary:     Effort: Pulmonary effort is normal.     Breath sounds: Normal breath sounds.     Comments: Oxygen: 1 Liter TC  Musculoskeletal:     Comments: Normal Muscle Bulk and Muscle Testing Reveals:  Upper Extremities: Full ROM and Muscle Strength 5/5  Thoracic Paraspinal Tenderness: T-7-T-10 Lumbar Paraspinal Tenderness: L-4-L-5 Lower Extremities: Full ROM and Muscle Strength 5/5 Arises from Table with ease Narrow Based  Gait      Skin:    General: Skin is warm and dry.   Neurological:     Mental Status: He is alert and oriented to person, place, and time.   Psychiatric:        Mood and Affect: Mood normal.        Behavior: Behavior normal.         Assessment & Plan:  1.Thoracic Spine Fracture:  Continue HEP as Tolerated. Continue to Monitor. 10/23/2023 2. 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 Continue to Monitor. 10/23/2023 3.Subglottic Stenosis/Tracheostomy: [place] with 2 liters of Oxygen: Dr. Westley Hammers Following. Continue to Monitor. 10/23/2023 4. Urinary Retention due to BPH: Continue current medication regimen with Hytrin , Flomax  and Urecholine . 10/23/2023 4. Chronic  Pain:Syndrome Refilled: Oxycontin  15 mg  every 12 hours# 60 and  and Oxycodone  5 mg 4 times a day as needed for pain #120.  We will continue the opioid monitoring program, this consists of regular clinic visits, examinations, urine drug screen, pill counts as well as use of [place]  Controlled Substance Reporting system. A 12 month History has been reviewed on the [place]  Controlled Substance Reporting System on 06/25/2023 5. Slow Transit Constipation: Continue with Bowel Regimen. Continue to Monitor. 10/23/2023   F/U in 2  months

## 2023-10-26 ENCOUNTER — Other Ambulatory Visit (HOSPITAL_COMMUNITY): Payer: Self-pay

## 2023-10-27 ENCOUNTER — Other Ambulatory Visit: Payer: Self-pay

## 2023-10-31 MED ORDER — OXYCODONE HCL ER 15 MG PO T12A
15.0000 mg | EXTENDED_RELEASE_TABLET | Freq: Two times a day (BID) | ORAL | 0 refills | Status: DC
  Filled 2023-10-31 – 2023-11-25 (×6): qty 60, 30d supply, fill #0

## 2023-10-31 MED ORDER — OXYCODONE HCL 5 MG PO TABS
5.0000 mg | ORAL_TABLET | ORAL | 0 refills | Status: DC | PRN
  Filled 2023-10-31 – 2023-12-09 (×4): qty 120, 20d supply, fill #0

## 2023-11-02 ENCOUNTER — Other Ambulatory Visit: Payer: Self-pay | Admitting: Internal Medicine

## 2023-11-02 ENCOUNTER — Encounter (HOSPITAL_COMMUNITY): Payer: Self-pay

## 2023-11-02 ENCOUNTER — Other Ambulatory Visit (HOSPITAL_COMMUNITY): Payer: Self-pay

## 2023-11-02 ENCOUNTER — Other Ambulatory Visit: Payer: Self-pay

## 2023-11-02 DIAGNOSIS — J449 Chronic obstructive pulmonary disease, unspecified: Secondary | ICD-10-CM

## 2023-11-02 MED ORDER — VENTOLIN HFA 108 (90 BASE) MCG/ACT IN AERS
1.0000 | INHALATION_SPRAY | RESPIRATORY_TRACT | 1 refills | Status: DC | PRN
  Filled 2023-11-11: qty 18, 17d supply, fill #0
  Filled 2023-12-03: qty 18, 17d supply, fill #1

## 2023-11-03 ENCOUNTER — Other Ambulatory Visit: Payer: Self-pay | Admitting: Internal Medicine

## 2023-11-03 ENCOUNTER — Other Ambulatory Visit: Payer: Self-pay

## 2023-11-03 ENCOUNTER — Other Ambulatory Visit (HOSPITAL_COMMUNITY): Payer: Self-pay

## 2023-11-03 MED ORDER — BETHANECHOL CHLORIDE 25 MG PO TABS
50.0000 mg | ORAL_TABLET | Freq: Four times a day (QID) | ORAL | 0 refills | Status: DC
Start: 2023-11-03 — End: 2024-02-04
  Filled 2023-11-03: qty 720, 90d supply, fill #0

## 2023-11-06 ENCOUNTER — Other Ambulatory Visit: Payer: Self-pay

## 2023-11-09 ENCOUNTER — Other Ambulatory Visit: Payer: Self-pay

## 2023-11-09 ENCOUNTER — Other Ambulatory Visit (HOSPITAL_COMMUNITY): Payer: Self-pay

## 2023-11-10 ENCOUNTER — Other Ambulatory Visit (HOSPITAL_COMMUNITY): Payer: Self-pay

## 2023-11-11 ENCOUNTER — Other Ambulatory Visit: Payer: Self-pay

## 2023-11-11 ENCOUNTER — Other Ambulatory Visit (HOSPITAL_COMMUNITY): Payer: Self-pay

## 2023-11-13 ENCOUNTER — Other Ambulatory Visit (HOSPITAL_COMMUNITY): Payer: Self-pay

## 2023-11-16 ENCOUNTER — Encounter: Payer: Self-pay | Admitting: *Deleted

## 2023-11-16 ENCOUNTER — Other Ambulatory Visit: Payer: Self-pay

## 2023-11-16 ENCOUNTER — Other Ambulatory Visit (HOSPITAL_COMMUNITY): Payer: Self-pay

## 2023-11-16 NOTE — Progress Notes (Signed)
 Patient notified via mail of LDCT lung cancer screening results with recommendations to follow up in 12 months.  Also notified of incidental findings and need to follow up with PCP.  Patient's referring provider was sent a copy of results.    IMPRESSION: 1. Lung-RADS 2, benign appearance or behavior. Continue annual screening with low-dose chest CT without contrast in 12 months. 2. Coronary artery calcifications. 3. Aortic Atherosclerosis (ICD10-I70.0) and Emphysema (ICD10-J43.9).

## 2023-11-18 ENCOUNTER — Other Ambulatory Visit (HOSPITAL_COMMUNITY): Payer: Self-pay

## 2023-11-23 ENCOUNTER — Other Ambulatory Visit (HOSPITAL_COMMUNITY): Payer: Self-pay

## 2023-11-23 ENCOUNTER — Ambulatory Visit: Admitting: Urology

## 2023-11-23 ENCOUNTER — Other Ambulatory Visit: Payer: Self-pay

## 2023-11-24 ENCOUNTER — Encounter: Payer: Self-pay | Admitting: Pharmacist

## 2023-11-24 ENCOUNTER — Other Ambulatory Visit: Payer: Self-pay | Admitting: Nurse Practitioner

## 2023-11-24 ENCOUNTER — Other Ambulatory Visit: Payer: Self-pay

## 2023-11-24 ENCOUNTER — Telehealth (HOSPITAL_COMMUNITY): Payer: Self-pay

## 2023-11-24 ENCOUNTER — Other Ambulatory Visit (HOSPITAL_COMMUNITY): Payer: Self-pay

## 2023-11-24 NOTE — Telephone Encounter (Signed)
 This medication is not a prior auth, it is too early. It was last filled on 11/10/23 for a 20 day supply.

## 2023-11-24 NOTE — Telephone Encounter (Signed)
 Pharmacy Patient Advocate Encounter   Received notification from Pt Calls Messages that prior authorization for OxyCONTIN  15MG  er tablets  is required/requested.   Insurance verification completed.   The patient is insured through [facility] MEDICAID .   Per test claim: PA required; PA submitted to above mentioned insurance via CoverMyMeds Key/confirmation #/EOC [id number] Status is pending

## 2023-11-24 NOTE — Telephone Encounter (Signed)
 PA request has been Received. New Encounter has been or will be created for follow up. For additional info see Pharmacy Prior Auth telephone encounter from 11/24/23.

## 2023-11-25 ENCOUNTER — Other Ambulatory Visit: Payer: Self-pay

## 2023-11-25 ENCOUNTER — Other Ambulatory Visit (HOSPITAL_COMMUNITY): Payer: Self-pay

## 2023-11-25 ENCOUNTER — Telehealth: Payer: Self-pay | Admitting: *Deleted

## 2023-11-25 ENCOUNTER — Ambulatory Visit: Attending: Cardiology | Admitting: Cardiology

## 2023-11-25 ENCOUNTER — Encounter: Payer: Self-pay | Admitting: Cardiology

## 2023-11-25 VITALS — BP 128/80 | HR 88 | Ht 68.0 in | Wt 197.0 lb

## 2023-11-25 DIAGNOSIS — I25119 Atherosclerotic heart disease of native coronary artery with unspecified angina pectoris: Secondary | ICD-10-CM | POA: Insufficient documentation

## 2023-11-25 DIAGNOSIS — I10 Essential (primary) hypertension: Secondary | ICD-10-CM | POA: Insufficient documentation

## 2023-11-25 DIAGNOSIS — E782 Mixed hyperlipidemia: Secondary | ICD-10-CM | POA: Insufficient documentation

## 2023-11-25 MED ORDER — NITROGLYCERIN 0.4 MG SL SUBL
0.4000 mg | SUBLINGUAL_TABLET | SUBLINGUAL | 2 refills | Status: AC | PRN
  Filled 2023-11-25: qty 25, 1d supply, fill #0

## 2023-11-25 NOTE — Telephone Encounter (Signed)
 Prior auth submitted to [place] TRACKS for Oxycontin .

## 2023-11-25 NOTE — Telephone Encounter (Signed)
 Approval Status  Confirmation #:[id number] [facility] Plan:MCAIDHealth Plan:[id number] Prior Approval #:[id number] PA Type:PHARMACY Recipient:Charles HARRISONRecipient PI:[id number] MBilling Provider:Billing Provider Pi:[id number] Provider Name:EUNICE VELIA Minor Provider Pi:[id number] Submission Date:07/09/2025Status:APPROVED Effective Begin Date:07/09/2025Effective End Date:05/23/2024  Patient notified by MyCHart

## 2023-11-25 NOTE — Progress Notes (Signed)
    Cardiology Office Note  Date: 11/25/2023   ID: Charles Weeks, DOB 28-Apr-1966, MRN [id number]  History of Present Illness: Charles Weeks is a 58 y.o. male last seen in February 2024.  He is here today with assistant for a follow-up visit.  He reports occasional episodes of chest heaviness at nighttime, has not had to use any nitroglycerin .  Sometimes feeling anxious when this happens, he also remains on supplemental oxygen via tracheostomy.  I went over his medications.  We discussed getting a refill for a fresh bottle of nitroglycerin .  His Crestor  was increased from 5 mg daily to 10 mg daily based on lab work from June at which point LDL was 92.  Blood pressure control reasonable today.  Physical Exam: VS:  BP 128/80 (BP Location: Right Arm, Patient Position: Sitting)   Pulse 88   Ht 5' 8 (1.727 m)   Wt 197 lb (89.4 kg)   SpO2 98%   BMI 29.95 kg/m , BMI Body mass index is 29.95 kg/m.  Wt Readings from Last 3 Encounters:  11/25/23 197 lb (89.4 kg)  10/23/23 199 lb 12.8 oz (90.6 kg)  10/21/23 199 lb 6.4 oz (90.4 kg)    General: Patient appears comfortable at rest. HEENT: Conjunctiva and lids normal.  Tracheostomy in place with supplemental oxygen. Lungs: Decreased breath sounds with prolonged expiratory phase and slight wheeze. Cardiac: Regular rate and rhythm, no S3 or significant systolic murmur. Extremities: No pitting edema.  ECG:  An ECG dated 05/08/2023 was personally reviewed today and demonstrated:  Sinus rhythm with decreased anteroseptal R wave progression, nonspecific ST changes.  Labwork: 10/21/2023: ALT 27; AST 18; BUN 8; Creatinine, Ser 1.01; Hemoglobin 14.4; Platelets 217; Potassium 4.4; Sodium 142; TSH 2.570     Component Value Date/Time   CHOL 178 10/21/2023 1408   TRIG 266 (H) 10/21/2023 1408   HDL 41 10/21/2023 1408   CHOLHDL 4.3 10/21/2023 1408   CHOLHDL 3.3 03/05/2022 1215   VLDL 60 (H) 03/05/2022 1215   LDLCALC 92 10/21/2023 1408   Other Studies  Reviewed Today:  No interval cardiac testing for review today.  Assessment and Plan:  1.  CAD status post DES to the RCA in 2020. Follow-up Myoview  in September 2022 was low risk and echocardiogram at that time revealed LVEF 50 to 55%.  Plan to continue current medications for now, we did discuss possible addition of Imdur as a next step.  Continue aspirin  81 mg daily, as needed nitroglycerin  which will be refilled, Toprol -XL 12.5 mg daily, and Crestor  10 mg daily.   2.  Mixed hyperlipidemia, LDL 92 in June.  Crestor  increased to 10 mg daily at that time.   3.  Primary hypertension.  No change to current regimen.   4.  COPD with chronic hypoxic respiratory failure and tracheostomy, following with Dr. Darlean.  Disposition:  Follow up 6 months.  Signed, Jayson JUDITHANN Sierras, M.D., F.A.C.C. [facility]

## 2023-11-25 NOTE — Patient Instructions (Signed)
 Medication Instructions:  Your physician recommends that you continue on your current medications as directed. Please refer to the Current Medication list given to you today.   Labwork: None today  Testing/Procedures: None today  Follow-Up: 6 months  Any Other Special Instructions Will Be Listed Below (If Applicable).  If you need a refill on your cardiac medications before your next appointment, please call your pharmacy.

## 2023-11-26 ENCOUNTER — Other Ambulatory Visit (HOSPITAL_COMMUNITY): Payer: Self-pay

## 2023-11-27 ENCOUNTER — Other Ambulatory Visit: Payer: Self-pay

## 2023-11-27 ENCOUNTER — Encounter: Payer: Self-pay | Admitting: Internal Medicine

## 2023-11-30 ENCOUNTER — Other Ambulatory Visit (HOSPITAL_COMMUNITY): Payer: Self-pay

## 2023-12-01 ENCOUNTER — Other Ambulatory Visit: Payer: Self-pay

## 2023-12-02 ENCOUNTER — Other Ambulatory Visit (HOSPITAL_COMMUNITY): Payer: Self-pay

## 2023-12-03 ENCOUNTER — Other Ambulatory Visit: Payer: Self-pay

## 2023-12-04 ENCOUNTER — Other Ambulatory Visit (HOSPITAL_COMMUNITY): Payer: Self-pay

## 2023-12-04 ENCOUNTER — Telehealth: Payer: Self-pay | Admitting: *Deleted

## 2023-12-04 ENCOUNTER — Encounter: Payer: Self-pay | Admitting: Cardiology

## 2023-12-04 ENCOUNTER — Other Ambulatory Visit: Payer: Self-pay

## 2023-12-04 ENCOUNTER — Other Ambulatory Visit: Payer: Self-pay | Admitting: *Deleted

## 2023-12-04 MED ORDER — METOPROLOL SUCCINATE ER 25 MG PO TB24
12.5000 mg | ORAL_TABLET | Freq: Every day | ORAL | 3 refills | Status: DC
  Filled 2023-12-04: qty 45, 90d supply, fill #0

## 2023-12-04 MED ORDER — METOPROLOL SUCCINATE ER 25 MG PO TB24
12.5000 mg | ORAL_TABLET | Freq: Every day | ORAL | 3 refills | Status: AC
  Filled 2023-12-04: qty 45, 90d supply, fill #0
  Filled 2024-02-25: qty 45, 90d supply, fill #1
  Filled 2024-03-04 – 2024-05-25 (×2): qty 45, 90d supply, fill #2

## 2023-12-04 NOTE — Telephone Encounter (Signed)
 Refill complete

## 2023-12-05 ENCOUNTER — Other Ambulatory Visit (HOSPITAL_COMMUNITY): Payer: Self-pay

## 2023-12-08 ENCOUNTER — Other Ambulatory Visit: Payer: Self-pay

## 2023-12-08 ENCOUNTER — Other Ambulatory Visit (HOSPITAL_COMMUNITY): Payer: Self-pay

## 2023-12-09 ENCOUNTER — Other Ambulatory Visit: Payer: Self-pay

## 2023-12-09 ENCOUNTER — Other Ambulatory Visit (HOSPITAL_COMMUNITY): Payer: Self-pay

## 2023-12-13 ENCOUNTER — Other Ambulatory Visit: Payer: Self-pay | Admitting: Internal Medicine

## 2023-12-14 ENCOUNTER — Other Ambulatory Visit: Payer: Self-pay

## 2023-12-14 ENCOUNTER — Other Ambulatory Visit (HOSPITAL_COMMUNITY): Payer: Self-pay

## 2023-12-14 DIAGNOSIS — J449 Chronic obstructive pulmonary disease, unspecified: Secondary | ICD-10-CM

## 2023-12-14 MED ORDER — VENTOLIN HFA 108 (90 BASE) MCG/ACT IN AERS
1.0000 | INHALATION_SPRAY | RESPIRATORY_TRACT | 1 refills | Status: DC | PRN
  Filled 2023-12-30: qty 18, 17d supply, fill #0
  Filled 2024-01-15: qty 18, 17d supply, fill #1

## 2023-12-15 ENCOUNTER — Ambulatory Visit: Payer: Self-pay

## 2023-12-15 ENCOUNTER — Other Ambulatory Visit: Payer: Self-pay

## 2023-12-15 MED ORDER — METHYLPREDNISOLONE 4 MG PO TBPK
ORAL_TABLET | ORAL | 0 refills | Status: DC
Start: 2023-12-15 — End: 2024-02-29

## 2023-12-15 MED ORDER — CEPHALEXIN 500 MG PO CAPS: 500.0000 mg | ORAL_CAPSULE | Freq: Three times a day (TID) | ORAL | 0 refills | Status: AC

## 2023-12-15 NOTE — Telephone Encounter (Signed)
 FYI Only or Action Required?: FYI only for provider.  Patient was last seen in primary care on 10/21/2023 by Melvenia Manus BRAVO, MD.  Called Nurse Triage reporting Insect Bite.  Symptoms began a week ago.  Interventions attempted: OTC medications: TAO  and Rest, hydration, or home remedies.  Symptoms are: gradually worsening.  Triage Disposition: See Physician Within 24 Hours  Patient/caregiver understands and will follow disposition?: Yes Copied from CRM #[id number]. Topic: Clinical - Red Word Triage >> Dec 15, 2023 10:48 AM Tonda B wrote: [facility] that prompted transfer to Nurse Triage: patient has a spider and  has swelling redness as well as pain Reason for Disposition  [1] Red or very tender (to touch) area AND [2] getting larger over 48 hours after the bite  Answer Assessment - Initial Assessment Questions 1. TYPE of SPIDER: What type of spider was it?  (e.g., name, unknown, or brief description)     unknown 2. LOCATION: Where is the bite located?      Right forearm 3. PAIN: Is there any pain? If Yes, ask: How bad is it?  (Scale 1-10; or mild, moderate, severe)     painful 4. SWELLING: How big is the swelling? (Inches, cm or compare to coins)      Red and swollen, marked with ink pen, approximately as large as a tennis ball (redness) Firm Golf ball sized area 5. ONSET: When did the bite occur? (e.g., minutes, hours ago)      7 days ago 6. TETANUS: When was your last tetanus booster?      unknown 7. OTHER SYMPTOMS: Do you have any other symptoms?  (e.g., muscle cramps, abdomen pain, change in urine color)     Patient and his sister have been pushing thick white pus from area. Patient's sister states that patient had been picking on it and that she has been putting antibiotic ointment on it  Protocols used: Spider Bite - [employer]

## 2023-12-22 ENCOUNTER — Other Ambulatory Visit: Payer: Self-pay

## 2023-12-22 ENCOUNTER — Encounter: Payer: Self-pay | Admitting: Registered Nurse

## 2023-12-22 ENCOUNTER — Other Ambulatory Visit (HOSPITAL_COMMUNITY): Payer: Self-pay

## 2023-12-22 ENCOUNTER — Encounter: Attending: Physical Medicine & Rehabilitation | Admitting: Registered Nurse

## 2023-12-22 VITALS — BP 126/83 | HR 78 | Ht 68.0 in | Wt 196.4 lb

## 2023-12-22 DIAGNOSIS — Z5181 Encounter for therapeutic drug level monitoring: Secondary | ICD-10-CM | POA: Diagnosis present

## 2023-12-22 DIAGNOSIS — G894 Chronic pain syndrome: Secondary | ICD-10-CM | POA: Insufficient documentation

## 2023-12-22 DIAGNOSIS — Z79891 Long term (current) use of opiate analgesic: Secondary | ICD-10-CM | POA: Diagnosis present

## 2023-12-22 DIAGNOSIS — Z79899 Other long term (current) drug therapy: Secondary | ICD-10-CM | POA: Insufficient documentation

## 2023-12-22 DIAGNOSIS — M545 Low back pain, unspecified: Secondary | ICD-10-CM | POA: Diagnosis present

## 2023-12-22 DIAGNOSIS — G8929 Other chronic pain: Secondary | ICD-10-CM | POA: Insufficient documentation

## 2023-12-22 MED ORDER — CLONAZEPAM 0.5 MG PO TABS
0.5000 mg | ORAL_TABLET | Freq: Every day | ORAL | 2 refills | Status: DC
  Filled 2023-12-22: qty 30, 30d supply, fill #0
  Filled 2024-01-21: qty 30, 30d supply, fill #1
  Filled 2024-02-18 (×2): qty 30, 30d supply, fill #2

## 2023-12-22 MED ORDER — OXYCODONE HCL 5 MG PO TABS
5.0000 mg | ORAL_TABLET | ORAL | 0 refills | Status: DC | PRN
  Filled 2023-12-22 – 2024-01-06 (×2): qty 120, 20d supply, fill #0

## 2023-12-22 MED ORDER — OXYCODONE HCL ER 15 MG PO T12A
15.0000 mg | EXTENDED_RELEASE_TABLET | Freq: Two times a day (BID) | ORAL | 0 refills | Status: DC
  Filled 2023-12-22: qty 60, 30d supply, fill #0

## 2023-12-22 NOTE — Progress Notes (Signed)
 Subjective:    Patient ID: Charles Weeks, male    DOB: 05-06-1966, 58 y.o.   MRN: [id number]  HPI: Charles Weeks is a 58 y.o. male who returns for follow up appointment for chronic pain and medication refill. He states his  pain is located in his mid- lower back. He rates his pain 9. His current exercise regime is walking and performing stretching exercises.  Mr. Saetern Morphine  equivalent is 90.00 MME.   Oral Swab was Performed today.     Pain Inventory Average Pain 9 Pain Right Now 9 My pain is sharp, burning, stabbing, tingling, and aching  In the last 24 hours, has pain interfered with the following? General activity 10 Relation with others 10 Enjoyment of life 10 What TIME of day is your pain at its worst? morning  and night Sleep (in general) Poor  Pain is worse with: walking, bending, inactivity, standing, unsure, and some activites Pain improves with: rest and medication Relief from Meds: 2  Family History  Problem Relation Age of Onset   Heart disease Mother    Heart disease Father    Diabetes Sister    Social History   Socioeconomic History   Marital status: Divorced    Spouse name: Not on file   Number of children: 2   Years of education: Not on file   Highest education level: 7th grade  Occupational History   Not on file  Tobacco Use   Smoking status: Former    Current packs/day: 0.00    Types: Cigarettes    Quit date: 08/17/2020    Years since quitting: 3.3   Smokeless tobacco: Never  Vaping Use   Vaping status: Never Used  Substance and Sexual Activity   Alcohol use: Not Currently   Drug use: Not Currently   Sexual activity: Not on file  Other Topics Concern   Not on file  Social History Narrative   ** Merged History Encounter **       Lives with sister  Both daughters live close by   Sister has dog   Enjoys: shooting pool, fishing, Therapist, nutritional  Diet: eats all food groups Caffeine: pop-dr pepper 2 liter  Water : 4-6 cups   Wears seat  belt  No driving for him Psychologist, sport and exercise  Fi   re Extinguisher No weapons        Social Drivers of Corporate investment banker Strain: Low Risk  (04/21/2023)   Overall Financial Resource Strain (CARDIA)    Difficulty of Paying Living Expenses: Not hard at all  Food Insecurity: No Food Insecurity (04/21/2023)   Hunger Vital Sign    Worried About Running Out of Food in the Last Year: Never true    Ran Out of Food in the Last Year: Never true  Transportation Needs: No Transportation Needs (04/21/2023)   PRAPARE - Administrator, Civil Service (Medical): No    Lack of Transportation (Non-Medical): No  Physical Activity: Unknown (04/21/2023)   Exercise Vital Sign    Days of Exercise per Week: 0 days    Minutes of Exercise per Session: Not on file  Stress: No Stress Concern Present (04/21/2023)   [employer] of Occupational Health - Occupational Stress Questionnaire    Feeling of Stress : Only a little  Social Connections: Socially Isolated (04/21/2023)   Social Connection and Isolation Panel    Frequency of Communication with Friends and Family: More than three times a week    Frequency of Social  Gatherings with Friends and Family: Once a week    Attends Religious Services: Never    Database administrator or Organizations: No    Attends Engineer, structural: Not on file    Marital Status: Divorced   Past Surgical History:  Procedure Laterality Date   BALLOON DILATION N/A 02/06/2021   Procedure: BALLOON DILATION;  Surgeon: Carlie Clark, MD;  Location: [facility] OR;  Service: ENT;  Laterality: N/A;   BOWEL RESECTION  2006   COLONOSCOPY  03/2022   [facility]   MICROLARYNGOSCOPY WITH LASER AND BALLOON DILATION N/A 02/06/2021   Procedure: SUSPENDED MICRO DIRECT LARYNGOSCOPY;  Surgeon: Carlie Clark, MD;  Location: [facility] OR;  Service: ENT;  Laterality: N/A;   PEG PLACEMENT N/A 09/03/2020   Procedure: PERCUTANEOUS ENDOSCOPIC GASTROSTOMY (PEG) PLACEMENT;  Surgeon: Sebastian Moles, MD;  Location: [facility] OR;  Service: General;  Laterality: N/A;   PERCUTANEOUS TRACHEOSTOMY N/A 09/03/2020   Procedure: PERCUTANEOUS TRACHEOSTOMY USING 6 SHILEY;  Surgeon: Sebastian Moles, MD;  Location: [facility] OR;  Service: General;  Laterality: N/A;   SKIN GRAFT     TONSILLECTOMY AND ADENOIDECTOMY     Past Surgical History:  Procedure Laterality Date   BALLOON DILATION N/A 02/06/2021   Procedure: BALLOON DILATION;  Surgeon: Carlie Clark, MD;  Location: [facility] OR;  Service: ENT;  Laterality: N/A;   BOWEL RESECTION  2006   COLONOSCOPY  03/2022   [facility]   MICROLARYNGOSCOPY WITH LASER AND BALLOON DILATION N/A 02/06/2021   Procedure: SUSPENDED MICRO DIRECT LARYNGOSCOPY;  Surgeon: Carlie Clark, MD;  Location[facility] OR;  Service: ENT;  Laterality: N/A;   PEG PLACEMENT N/A 09/03/2020   Procedure: PERCUTANEOUS ENDOSCOPIC GASTROSTOMY (PEG) PLACEMENT;  Surgeon: Sebastian Moles, MD;  Location[facility] OR;  Service: General;  Laterality: N/A;   PERCUTANEOUS TRACHEOSTOMY N/A 09/03/2020   Procedure: PERCUTANEOUS TRACHEOSTOMY USING 6 SHILEY;  Surgeon: Sebastian Moles, MD;  Location: [facility] OR;  Service: General;  Laterality: N/A;   SKIN GRAFT     TONSILLECTOMY AND ADENOIDECTOMY     Past Medical History:  Diagnosis Date   Anxiety 01/19/2020   Arthritis    Asthma    Benign prostatic hyperplasia with weak urinary stream 12/08/2019   BPH (benign prostatic hyperplasia) 08/17/2020   Chronic respiratory failure with hypoxia (HCC) 01/24/2020   Has home 02 but not using as of 01/24/2020  -  01/24/2020   Walked RA  approx   200 ft  @ slow pace  stopped due to  Dizzy with sats still 95%      Cigarette smoker 01/24/2020   Stopped regular smoking 10/2019 but still now and then as of 01/24/2020    COPD (chronic obstructive pulmonary disease) (HCC) 08/17/2020   gold   COPD GOLD ?     Quit smoking 10/2019  - Labs ordered 01/24/2020  :  allergy profile   alpha one AT phenotype   - 01/24/2020  After extensive coaching inhaler device,   effectiveness =    75% (short Ti) > change symb 80 to 160 2bid      Coronary artery disease 06/08/2018   DES to RCA, [facility] [place] [place]   Depression, major, single episode, moderate (HCC) 01/19/2020   Dyspnea    Encounter for screening for malignant neoplasm of colon 12/08/2019   Essential hypertension 12/08/2019   History of MI (myocardial infarction) 01/19/2020   HTN (hypertension) 08/17/2020   Myocardial infarct (HCC) 2019   Pneumonia    Positive colorectal cancer screening using  Cologuard test    BP 126/83 (BP Location: Left Arm, Patient Position: Sitting, Cuff Size: Normal)   Pulse 78   Ht 5' 8 (1.727 m)   Wt 196 lb 6.4 oz (89.1 kg)   SpO2 96%   BMI 29.86 kg/m   Opioid Risk Score:   Fall Risk Score:  `1  Depression screen PHQ 2/9     12/22/2023    1:27 PM 10/23/2023    1:43 PM 10/21/2023    1:46 PM 08/18/2023    1:18 PM 06/25/2023    1:21 PM 04/21/2023    1:10 PM 02/18/2023    1:13 PM  Depression screen PHQ 2/9  Decreased Interest 3 0 1 2 2  0 0  Down, Depressed, Hopeless 3 0 1 2 2  0 0  PHQ - 2 Score 6 0 2 4 4  0 0  Altered sleeping 3 0 1      Tired, decreased energy 3 0 1      Change in appetite  0 0      Feeling bad or failure about yourself  0 0 1      Trouble concentrating 0 0 1      Moving slowly or fidgety/restless  0 0      Suicidal thoughts 0 0 0      PHQ-9 Score 12 0 6      Difficult doing work/chores Very difficult Not difficult at all Somewhat difficult         Review of Systems  Musculoskeletal:  Positive for back pain.       Back pain  All other systems reviewed and are negative.      Objective:   Physical Exam Vitals and nursing note reviewed.  Constitutional:      Appearance: Normal appearance.  Cardiovascular:     Rate and Rhythm: Normal rate and regular rhythm.     Pulses: Normal pulses.     Heart sounds: Normal heart sounds.  Pulmonary:     Effort: Pulmonary effort is normal.     Breath sounds: Normal breath sounds.      Comments: Continuous Oxygen : TC: 2 Liters Musculoskeletal:     Comments: Normal Muscle Bulk and Muscle Testing Reveals:  Upper Extremities: Full ROM and Muscle Strength 5/5 Thoracic Paraspinal Tenderness: T-4-T-6 Lumbar Paraspinal Tenderness: L-3- L-5  Lower Extremities : Full ROM and Muscle Strength 5/5 Arises from Table slowly  Narrow Based Gait     Skin:    General: Skin is warm and dry.  Neurological:     Mental Status: He is alert and oriented to person, place, and time.  Psychiatric:        Mood and Affect: Mood normal.        Behavior: Behavior normal.          Assessment & Plan:  1.Thoracic Spine Fracture:  Continue HEP as Tolerated. Continue to Monitor. 12/23/2023 2. 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 Continue to Monitor. 12/23/2023 3.Subglottic Stenosis/Tracheostomy: Jamal Chu with 2 liters of Oxygen: Dr. Llewellyn Following. Continue to Monitor. 12/23/2023 4. Urinary Retention due to BPH: Continue current medication regimen with Hytrin , Flomax  and Urecholine . 12/23/2023 4. Chronic  Pain:Syndrome Refilled: Oxycontin  15 mg every 12 hours# 60 and  and Oxycodone  5 mg 4 times a day as needed for pain #120.  We will continue the opioid monitoring program, this consists of regular clinic visits, examinations, urine drug screen, pill counts as well as use of [place] Controlled Substance Reporting system. A 12 month History has been reviewed on the [place]  Controlled Substance Reporting System on 12/23/2023 5. Slow Transit Constipation: Continue with Bowel Regimen. Continue to Monitor. 12/23/2023   F/U in 2 months

## 2023-12-23 ENCOUNTER — Other Ambulatory Visit (HOSPITAL_COMMUNITY): Payer: Self-pay

## 2023-12-24 LAB — DRUG TOX MONITOR 1 W/CONF, ORAL FLD
Alprazolam: NEGATIVE ng/mL (ref <0.50)
Aminoclonazepam: 3.22 ng/mL — ABNORMAL HIGH (ref <0.50)
Amphetamines: NEGATIVE ng/mL (ref <10)
Barbiturates: NEGATIVE ng/mL (ref <10)
Benzodiazepines: POSITIVE ng/mL — AB (ref <0.50)
Buprenorphine: NEGATIVE ng/mL (ref <0.10)
Chlordiazepoxide: NEGATIVE ng/mL (ref <0.50)
Clonazepam: 0.79 ng/mL — ABNORMAL HIGH (ref <0.50)
Cocaine: NEGATIVE ng/mL (ref <5.0)
Codeine: NEGATIVE ng/mL (ref <2.5)
Diazepam: NEGATIVE ng/mL (ref <0.50)
Dihydrocodeine: NEGATIVE ng/mL (ref <2.5)
Fentanyl: NEGATIVE ng/mL (ref <0.10)
Flunitrazepam: NEGATIVE ng/mL (ref <0.50)
Flurazepam: NEGATIVE ng/mL (ref <0.50)
Heroin Metabolite: NEGATIVE ng/mL (ref <1.0)
Hydrocodone: NEGATIVE ng/mL (ref <2.5)
Hydromorphone: NEGATIVE ng/mL (ref <2.5)
Lorazepam: NEGATIVE ng/mL (ref <0.50)
MARIJUANA: NEGATIVE ng/mL (ref <2.5)
MDMA: NEGATIVE ng/mL (ref <10)
Meprobamate: NEGATIVE ng/mL (ref <2.5)
Methadone: NEGATIVE ng/mL (ref <5.0)
Midazolam: NEGATIVE ng/mL (ref <0.50)
Morphine: NEGATIVE ng/mL (ref <2.5)
Nicotine Metabolite: NEGATIVE ng/mL (ref <5.0)
Nordiazepam: NEGATIVE ng/mL (ref <0.50)
Norhydrocodone: NEGATIVE ng/mL (ref <2.5)
Noroxycodone: 140.9 ng/mL — ABNORMAL HIGH (ref <2.5)
Opiates: POSITIVE ng/mL — AB (ref <2.5)
Oxazepam: NEGATIVE ng/mL (ref <0.50)
Oxycodone: >250 ng/mL — ABNORMAL HIGH (ref <2.5)
Oxymorphone: NEGATIVE ng/mL (ref <2.5)
Phencyclidine: NEGATIVE ng/mL (ref <10)
Tapentadol: NEGATIVE ng/mL (ref <5.0)
Temazepam: NEGATIVE ng/mL (ref <0.50)
Tramadol: NEGATIVE ng/mL (ref <5.0)
Triazolam: NEGATIVE ng/mL (ref <0.50)
Zolpidem: NEGATIVE ng/mL (ref <5.0)

## 2023-12-24 LAB — DRUG TOX ALC METAB W/CON, ORAL FLD: Alcohol Metabolite: NEGATIVE ng/mL (ref <25)

## 2023-12-25 ENCOUNTER — Other Ambulatory Visit (HOSPITAL_COMMUNITY): Payer: Self-pay

## 2023-12-25 ENCOUNTER — Other Ambulatory Visit: Payer: Self-pay | Admitting: Internal Medicine

## 2023-12-25 DIAGNOSIS — F411 Generalized anxiety disorder: Secondary | ICD-10-CM

## 2023-12-25 MED ORDER — BUSPIRONE HCL 10 MG PO TABS
10.0000 mg | ORAL_TABLET | Freq: Two times a day (BID) | ORAL | 3 refills | Status: DC
  Filled 2023-12-30 (×2): qty 60, 30d supply, fill #0
  Filled 2024-01-25: qty 60, 30d supply, fill #1
  Filled 2024-03-04: qty 60, 30d supply, fill #2
  Filled 2024-04-01: qty 60, 30d supply, fill #3

## 2023-12-27 MED ORDER — OXYCODONE HCL ER 15 MG PO T12A
15.0000 mg | EXTENDED_RELEASE_TABLET | Freq: Two times a day (BID) | ORAL | 0 refills | Status: DC
  Filled 2023-12-27 – 2024-01-21 (×2): qty 60, 30d supply, fill #0

## 2023-12-28 ENCOUNTER — Other Ambulatory Visit (HOSPITAL_COMMUNITY): Payer: Self-pay

## 2023-12-28 ENCOUNTER — Other Ambulatory Visit: Payer: Self-pay | Admitting: Internal Medicine

## 2023-12-28 ENCOUNTER — Other Ambulatory Visit: Payer: Self-pay

## 2023-12-28 MED FILL — Albuterol Sulfate Soln Nebu 0.083% (2.5 MG/3ML): RESPIRATORY_TRACT | 8 days supply | Qty: 90 | Fill #0 | Status: AC

## 2023-12-29 ENCOUNTER — Other Ambulatory Visit: Payer: Self-pay

## 2023-12-30 ENCOUNTER — Other Ambulatory Visit (HOSPITAL_COMMUNITY): Payer: Self-pay

## 2023-12-30 ENCOUNTER — Other Ambulatory Visit: Payer: Self-pay

## 2024-01-04 ENCOUNTER — Other Ambulatory Visit: Payer: Self-pay

## 2024-01-06 ENCOUNTER — Other Ambulatory Visit: Payer: Self-pay

## 2024-01-06 ENCOUNTER — Other Ambulatory Visit (HOSPITAL_COMMUNITY): Payer: Self-pay

## 2024-01-07 ENCOUNTER — Other Ambulatory Visit (HOSPITAL_COMMUNITY): Payer: Self-pay

## 2024-01-07 ENCOUNTER — Other Ambulatory Visit: Payer: Self-pay

## 2024-01-08 ENCOUNTER — Other Ambulatory Visit (HOSPITAL_COMMUNITY): Payer: Self-pay

## 2024-01-13 ENCOUNTER — Other Ambulatory Visit (HOSPITAL_COMMUNITY): Payer: Self-pay

## 2024-01-15 ENCOUNTER — Other Ambulatory Visit (HOSPITAL_COMMUNITY): Payer: Self-pay

## 2024-01-21 ENCOUNTER — Other Ambulatory Visit (HOSPITAL_COMMUNITY): Payer: Self-pay

## 2024-01-21 ENCOUNTER — Other Ambulatory Visit: Payer: Self-pay

## 2024-01-22 ENCOUNTER — Other Ambulatory Visit (HOSPITAL_COMMUNITY): Payer: Self-pay

## 2024-01-22 ENCOUNTER — Ambulatory Visit

## 2024-01-22 VITALS — BP 113/78 | HR 85 | Wt 194.0 lb

## 2024-01-22 DIAGNOSIS — S22008S Other fracture of unspecified thoracic vertebra, sequela: Secondary | ICD-10-CM

## 2024-01-22 DIAGNOSIS — E782 Mixed hyperlipidemia: Secondary | ICD-10-CM | POA: Diagnosis not present

## 2024-01-22 DIAGNOSIS — E1165 Type 2 diabetes mellitus with hyperglycemia: Secondary | ICD-10-CM

## 2024-01-22 MED ORDER — UNDERPADS EXTRA LARGE MISC: 1.0000 | Freq: Two times a day (BID) | 11 refills | Status: DC

## 2024-01-22 NOTE — Progress Notes (Signed)
 Established Patient Office Visit  Subjective   Patient ID: Charles Weeks, male    DOB: Dec 11, 1965  Age: 58 y.o. MRN: [id number]  Chief Complaint  Patient presents with   Follow-up    COPD, Diabetes     HPI Charles Weeks returns to care today for routine follow-up.  He was last seen by Charles Weeks on 10/21/2023.  Since then he has been seen by cardiology and PM&R.  Today he reports feeling fairly well.  He does not have any acute concerns to discuss.   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   Chronic cough 12/14/2020   Subglottic stenosis    MDD (major depressive disorder)    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   Fracture of spine, thoracic, without spinal cord injury, closed, sequela 08/17/2020   Chronic respiratory failure with hypoxia (HCC) 01/24/2020   Urinary retention due to benign prostatic hyperplasia 12/08/2019   COPD GOLD 4/ 02 dep      ROS    Objective:     BP 113/78 (BP Location: Left Arm, Patient Position: Sitting, Cuff Size: Large)   Pulse 85   Wt 194 lb 0.6 oz (88 kg)   SpO2 90%   BMI 29.50 kg/m  BP Readings from Last 3 Encounters:  01/22/24 113/78  12/22/23 126/83  11/25/23 128/80   Wt Readings from Last 3 Encounters:  01/22/24 194 lb 0.6 oz (88 kg)  12/22/23 196 lb 6.4 oz (89.1 kg)  11/25/23 197 lb (89.4 kg)      Physical Exam Vitals and nursing note reviewed.  Constitutional:       Appearance: Normal appearance.  HENT:     Head: Normocephalic.  Eyes:     Extraocular Movements: Extraocular movements intact.     Pupils: Pupils are equal, round, and reactive to light.  Cardiovascular:     Rate and Rhythm: Normal rate and regular rhythm.  Pulmonary:     Effort: Pulmonary effort is normal.     Breath sounds: Normal breath sounds.  Musculoskeletal:     Cervical back: Normal range of motion and neck supple.  Neurological:     Mental Status: He is alert and oriented to person, place, and time.  Psychiatric:        Mood and Affect: Mood normal.        Thought Content: Thought content normal.    Last CBC Lab Results  Component Value Date   WBC 7.6 10/21/2023   HGB 14.4 10/21/2023   HCT 44.4 10/21/2023   MCV 92 10/21/2023   MCH 29.8 10/21/2023   RDW 12.9 10/21/2023   PLT 217 10/21/2023   Last metabolic panel Lab Results  Component Value Date   GLUCOSE 115 (H) 01/22/2024   NA 143 01/22/2024   K 4.2 01/22/2024   CL 100 01/22/2024   CO2 29 01/22/2024   BUN 7 01/22/2024   CREATININE 1.03 01/22/2024   EGFR 84 01/22/2024   CALCIUM  9.4 01/22/2024  PHOS 4.2 11/28/2021   PROT 6.7 01/22/2024   ALBUMIN  4.4 01/22/2024   LABGLOB 2.3 01/22/2024   AGRATIO 2.1 10/23/2022   BILITOT 0.3 01/22/2024   ALKPHOS 147 (H) 01/22/2024   AST 16 01/22/2024   ALT 19 01/22/2024   ANIONGAP 4 (L) 11/28/2021   Last lipids Lab Results  Component Value Date   CHOL 122 01/22/2024   HDL 36 (L) 01/22/2024   LDLCALC 46 01/22/2024   TRIG 256 (H) 01/22/2024   CHOLHDL 3.4 01/22/2024   Last hemoglobin A1c Lab Results  Component Value Date   HGBA1C 6.3 (H) 01/22/2024   Last thyroid functions Lab Results  Component Value Date   TSH 2.570 10/21/2023   Last vitamin D  Lab Results  Component Value Date   VD25OH 56.0 10/21/2023   Last vitamin B12 and Folate Lab Results  Component Value Date   VITAMINB12 380 10/21/2023   FOLATE >20.0 10/21/2023      The ASCVD Risk  score (Arnett DK, et al., 2019) failed to calculate for the following reasons:   Risk score cannot be calculated because patient has a medical history suggesting prior/existing ASCVD    Assessment & Plan:   Problem List Items Addressed This Visit       Endocrine   Type 2 diabetes mellitus with hyperglycemia (HCC) - Primary   Diet controlled.  A1c 6.5 on labs from June 2025.  Repeat A1c ordered today.       Relevant Orders   CMP14+EGFR (Completed)   HgB A1c (Completed)   Urine Microalbumin w/creat. ratio     Musculoskeletal and Integument   Fracture of spine, thoracic, without spinal cord injury, closed, sequela   Had thoracic brace in place, had delayed healing Followed by Spine surgery and PM&R He still has limited mobility due to severe back pain Unable to sit from lying down position without support. Needs refills of Chucks pads for use with hospital bed.         Relevant Medications   Incontinence Supply Disposable (UNDERPADS EXTRA LARGE) MISC     Other   HLD (hyperlipidemia)   He is currently prescribed rosuvastatin  5 mg daily.  Repeat lipid panel ordered today.      Relevant Orders   CMP14+EGFR (Completed)   Lipid Profile (Completed)   No follow-ups on file.    Leita Longs, FNP

## 2024-01-23 LAB — CMP14+EGFR
ALT: 19 IU/L (ref 0–44)
AST: 16 IU/L (ref 0–40)
Albumin: 4.4 g/dL (ref 3.8–4.9)
Alkaline Phosphatase: 147 IU/L — ABNORMAL HIGH (ref 44–121)
BUN/Creatinine Ratio: 7 — ABNORMAL LOW (ref 9–20)
BUN: 7 mg/dL (ref 6–24)
Bilirubin Total: 0.3 mg/dL (ref 0.0–1.2)
CO2: 29 mmol/L (ref 20–29)
Calcium: 9.4 mg/dL (ref 8.7–10.2)
Chloride: 100 mmol/L (ref 96–106)
Creatinine, Ser: 1.03 mg/dL (ref 0.76–1.27)
Globulin, Total: 2.3 g/dL (ref 1.5–4.5)
Glucose: 115 mg/dL — ABNORMAL HIGH (ref 70–99)
Potassium: 4.2 mmol/L (ref 3.5–5.2)
Sodium: 143 mmol/L (ref 134–144)
Total Protein: 6.7 g/dL (ref 6.0–8.5)
eGFR: 84 mL/min/1.73 (ref >59)

## 2024-01-23 LAB — LIPID PANEL
Chol/HDL Ratio: 3.4 ratio (ref 0.0–5.0)
Cholesterol, Total: 122 mg/dL (ref 100–199)
HDL: 36 mg/dL — ABNORMAL LOW (ref >39)
LDL Chol Calc (NIH): 46 mg/dL (ref 0–99)
Triglycerides: 256 mg/dL — ABNORMAL HIGH (ref 0–149)
VLDL Cholesterol Cal: 40 mg/dL (ref 5–40)

## 2024-01-23 LAB — HEMOGLOBIN A1C
Est. average glucose Bld gHb Est-mCnc: 134 mg/dL
Hgb A1c MFr Bld: 6.3 % — ABNORMAL HIGH (ref 4.8–5.6)

## 2024-01-25 ENCOUNTER — Other Ambulatory Visit: Payer: Self-pay | Admitting: Internal Medicine

## 2024-01-25 ENCOUNTER — Other Ambulatory Visit (HOSPITAL_COMMUNITY): Payer: Self-pay

## 2024-01-25 ENCOUNTER — Other Ambulatory Visit: Payer: Self-pay

## 2024-01-25 DIAGNOSIS — F3341 Major depressive disorder, recurrent, in partial remission: Secondary | ICD-10-CM

## 2024-01-25 MED ORDER — BUPROPION HCL ER (XL) 300 MG PO TB24
300.0000 mg | ORAL_TABLET | Freq: Every day | ORAL | 5 refills | Status: AC
  Filled 2024-01-25: qty 30, 30d supply, fill #0
  Filled 2024-02-21: qty 30, 30d supply, fill #1
  Filled 2024-03-16: qty 30, 30d supply, fill #2
  Filled 2024-04-01 – 2024-04-17 (×2): qty 30, 30d supply, fill #3
  Filled 2024-05-16: qty 30, 30d supply, fill #4
  Filled 2024-06-09: qty 30, 30d supply, fill #5

## 2024-01-26 NOTE — Assessment & Plan Note (Signed)
 Had thoracic brace in place, had delayed healing Followed by Spine surgery and PM&R He still has limited mobility due to severe back pain Unable to sit from lying down position without support. Needs refills of Chucks pads for use with hospital bed.

## 2024-01-26 NOTE — Assessment & Plan Note (Signed)
 He is currently prescribed rosuvastatin  5 mg daily.  Repeat lipid panel ordered today.

## 2024-01-26 NOTE — Assessment & Plan Note (Signed)
 Diet controlled.  A1c 6.5 on labs from June 2025.  Repeat A1c ordered today.

## 2024-01-28 ENCOUNTER — Other Ambulatory Visit: Payer: Self-pay

## 2024-02-01 ENCOUNTER — Ambulatory Visit: Admitting: Urology

## 2024-02-01 ENCOUNTER — Other Ambulatory Visit (HOSPITAL_COMMUNITY): Payer: Self-pay

## 2024-02-01 VITALS — BP 117/78 | HR 87

## 2024-02-01 DIAGNOSIS — N401 Enlarged prostate with lower urinary tract symptoms: Secondary | ICD-10-CM | POA: Diagnosis not present

## 2024-02-01 DIAGNOSIS — N138 Other obstructive and reflux uropathy: Secondary | ICD-10-CM

## 2024-02-01 NOTE — Progress Notes (Unsigned)
 02/01/2024 2:20 PM   Charles Weeks [birth year] [id number]  Referring provider: Melvenia Manus BRAVO, MD [address] [place],  [place] [address]  No chief complaint on file.   HPI:  F/u -   1) BPH - on tamsulosin  and terazosin  5 mg. He had a prostate procedure in [place] around 2020. A 2023 CT with mild BPH - 30 g. His 2024 Cr 0.91. He voids with an adequate stream but it can stop and again and take quite a bit of time. No frequency. Some urgency and UUI. Could not leave specimen and did not fill out IPSS. He fell in 2022 and has a trach. Uses home 02. Broke back and neck. He is on bethanechol  and had retention in hospital. His PSA was 0.7 in 2025.    Today, seen for the above. Last visit, Apr 2025, we added finasteride  on hopes of improving emptying and stopping bethanechol . If frequency continues we could consider then adding an OAB med.  They want to reduce his medicines. His stream is OK. No frequency or urgency.   No OAC. H/o MI. Carries NTG. Has trach and home O2. Here with personal care assistant.   PMH: Past Medical History:  Diagnosis Date   Anxiety 01/19/2020   Arthritis    Asthma    Benign prostatic hyperplasia with weak urinary stream 12/08/2019   BPH (benign prostatic hyperplasia) 08/17/2020   Chronic respiratory failure with hypoxia (HCC) 01/24/2020   Has home 02 but not using as of 01/24/2020  -  01/24/2020   Walked RA  approx   200 ft  @ slow pace  stopped due to  Dizzy with sats still 95%      Cigarette smoker 01/24/2020   Stopped regular smoking 10/2019 but still now and then as of 01/24/2020    COPD (chronic obstructive pulmonary disease) (HCC) 08/17/2020   gold   COPD GOLD ?     Quit smoking 10/2019  - Labs ordered 01/24/2020  :  allergy profile   alpha one AT phenotype   - 01/24/2020  After extensive coaching inhaler device,  effectiveness =    75% (short Ti) > change symb 80 to 160 2bid      Coronary artery disease 06/08/2018   DES to RCA, [facility]  [place] [place])   Depression, major, single episode, moderate (HCC) 01/19/2020   Dyspnea    Encounter for screening for malignant neoplasm of colon 12/08/2019   Essential hypertension 12/08/2019   History of MI (myocardial infarction) 01/19/2020   HTN (hypertension) 08/17/2020   Myocardial infarct (HCC) 2019   Pneumonia    Positive colorectal cancer screening using Cologuard test     Surgical History: Past Surgical History:  Procedure Laterality Date   BALLOON DILATION N/A 02/06/2021   Procedure: BALLOON DILATION;  Surgeon: Carlie Clark, MD;  Location[facility];  Service: ENT;  Laterality: N/A;   BOWEL RESECTION  2006   COLONOSCOPY  03/2022   [facility]   MICROLARYNGOSCOPY WITH LASER AND BALLOON DILATION N/A 02/06/2021   Procedure: SUSPENDED MICRO DIRECT LARYNGOSCOPY;  Surgeon: Carlie Clark, MD;  Location: [facility] OR;  Service: ENT;  Laterality: N/A;   PEG PLACEMENT N/A 09/03/2020   Procedure: PERCUTANEOUS ENDOSCOPIC GASTROSTOMY (PEG) PLACEMENT;  Surgeon: Sebastian Moles, MD;  [facility] OR;  Service: General;  Laterality: N/A;   PERCUTANEOUS TRACHEOSTOMY N/A 09/03/2020   Procedure: PERCUTANEOUS TRACHEOSTOMY USING 6 SHILEY;  Surgeon: Sebastian Moles, MD;  Location: [facility] OR;  Service: General;  Laterality: N/A;   SKIN GRAFT     TONSILLECTOMY AND ADENOIDECTOMY      Home Medications:  Allergies as of 02/01/2024       Reactions   Ciprofloxacin  Hives        Medication List        Accurate as of February 01, 2024  2:20 PM. If you have any questions, ask your nurse or doctor.          acetaminophen  325 MG tablet Commonly known as: TYLENOL  Take 2 tablets (650 mg total) by mouth every 6 (six) hours as needed for mild pain or moderate pain.   aspirin  81 MG chewable tablet Chew 1 tablet (81 mg total) by mouth daily.   bethanechol  25 MG tablet Commonly known as: URECHOLINE  Take 2 tablets (50 mg total) by mouth 4 (four) times daily.   budesonide  0.25 MG/2ML nebulizer  solution Commonly known as: PULMICORT  Generic for Pulmicort . Inhale one vial in nebulizer twice a day. Rinse mouth after use.   buPROPion  300 MG 24 hr tablet Commonly known as: WELLBUTRIN  XL Take 1 tablet (300 mg total) by mouth daily.   busPIRone  10 MG tablet Commonly known as: BUSPAR  Take 1 tablet (10 mg total) by mouth 2 (two) times daily.   clonazePAM  0.5 MG tablet Commonly known as: KLONOPIN  Take 1 tablet (0.5 mg total) by mouth at bedtime.   finasteride  5 MG tablet Commonly known as: PROSCAR  Take 1 tablet (5 mg total) by mouth daily.   formoterol  20 MCG/2ML nebulizer solution Commonly known as: PERFOROMIST  Substituted for: Perforomist  Solution Inhale one vial in nebulizer twice a day.   methylPREDNISolone  4 MG Tbpk tablet Commonly known as: MEDROL  DOSEPAK Take as package instructions.   metoCLOPramide  5 MG tablet Commonly known as: Reglan  Take 1 tablet (5 mg total) by mouth every 6 (six) hours as needed for nausea.   metoprolol  succinate 25 MG 24 hr tablet Commonly known as: TOPROL -XL Take 0.5 tablets (12.5 mg total) by mouth daily.   montelukast  10 MG tablet Commonly known as: SINGULAIR  Take 1 tablet (10 mg total) by mouth at bedtime.   nitroGLYCERIN  0.4 MG SL tablet Commonly known as: NITROSTAT  Place 1 tablet (0.4 mg total) under the tongue every 5 (five) minutes as needed for chest pain.   nystatin ointment Commonly known as: MYCOSTATIN APPLY AROUND TRACH SITE TWICE DAILY FOR 2 WEEKS. MIX WITH TRIAMCINOLONE .   ondansetron  4 MG tablet Commonly known as: ZOFRAN  TAKE (1) TABLET BY MOUTH EVERY EIGHT HOURS AS NEEDED.   oxyCODONE  5 MG immediate release tablet Commonly known as: Roxicodone  Take 1 tablet (5 mg total) by mouth every 4 (four) hours as needed for severe pain (pain score 7-10).   OxyCONTIN  15 MG 12 hr tablet Generic drug: oxyCODONE  Take 1 tablet (15 mg total) by mouth every 12 (twelve) hours.   oxyCODONE  5 MG immediate release tablet Commonly  known as: Roxicodone  Take 1 tablet (5 mg total) by mouth every 4 (four) hours as needed for severe pain (pain score 7-10).   OxyCONTIN  15 MG 12 hr tablet Generic drug: oxyCODONE  Take 1 tablet (15 mg total) by mouth every 12 (twelve) hours. Do Not fill before 01/19/2024   OXYGEN Inhale 3 L into the lungs continuous.   pantoprazole  40 MG tablet Commonly known as: PROTONIX  Take 1 tablet (40 mg total) by mouth daily.   polyethylene glycol powder 17 GM/SCOOP powder Commonly known as: GoodSense ClearLax Take 17 g by mouth 2 (two) to 3 (three)  times daily as needed.   QUEtiapine  300 MG tablet Commonly known as: SEROQUEL  Take 0.5 tablets (150 mg total) by mouth 2 (two) times daily AND 1 tablet (300 mg total) at bedtime.   rosuvastatin  10 MG tablet Commonly known as: Crestor  Take 1 tablet (10 mg total) by mouth daily.   senna 8.6 MG tablet Commonly known as: SENOKOT Take 1 tablet by mouth at bedtime.   tamsulosin  0.4 MG Caps capsule Commonly known as: FLOMAX  Take 1 capsule (0.4 mg total) by mouth 2 (two) times daily.   terazosin  5 MG capsule Commonly known as: HYTRIN  Take 1 capsule (5 mg total) by mouth at bedtime.   Barista For transporting to and from Doctors appointments   triamcinolone  ointment 0.1 % Commonly known as: KENALOG  APPLY AROUND TRACH SITE TWICE DAILY FOR 2 WEEKS. MIX WITH NYSTATIN.   UNABLE TO FIND 1 each by Does not apply route daily. Med Name: LIFT CHAIR   UNABLE TO FIND 1 each by Does not apply route daily. Med Name: BATHROOM RENOVATION FOR WALK IN SHOWER   Underpads Extra Large Misc 1 Pad by Does not apply route in the morning and at bedtime.   Ventolin  HFA 108 (90 Base) MCG/ACT inhaler Generic drug: albuterol  Inhale 1-2 puffs into the lungs every 4 (four) hours as needed FOR WHEEZING OR SHORTNESS OF BREATH   albuterol  (2.5 MG/3ML) 0.083% nebulizer solution Commonly known as: PROVENTIL  Inhale 1 vial (3 mLs) into the lungs via  nebulizer every 6 (six) hours as needed for wheezing or shortness of breath   Vitamin D3 125 MCG (5000 UT) Caps Take 5,000 Units by mouth daily.        Allergies:  Allergies  Allergen Reactions   Ciprofloxacin  Hives    Family History: Family History  Problem Relation Age of Onset   Heart disease Mother    Heart disease Father    Diabetes Sister     Social History:  reports that he quit smoking about 3 years ago. His smoking use included cigarettes. He has never used smokeless tobacco. He reports that he does not currently use alcohol. He reports that he does not currently use drugs.   Physical Exam: BP 117/78   Pulse 87   Constitutional:  Alert and oriented, No acute distress. HEENT: [place] AT, moist mucus membranes.  Trachea midline, no masses. Cardiovascular: No clubbing, cyanosis, or edema. Respiratory: Normal respiratory effort, no increased work of breathing. GI: Abdomen is soft, nontender, nondistended, no abdominal masses GU: No CVA tenderness Lymph: No cervical or inguinal lymphadenopathy. Skin: No rashes, bruises or suspicious lesions. Neurologic: Grossly intact, no focal deficits, moving all 4 extremities. Psychiatric: Normal mood and affect.  Laboratory Data: Lab Results  Component Value Date   WBC 7.6 10/21/2023   HGB 14.4 10/21/2023   HCT 44.4 10/21/2023   MCV 92 10/21/2023   PLT 217 10/21/2023    Lab Results  Component Value Date   CREATININE 1.03 01/22/2024    No results found for: PSA  No results found for: TESTOSTERONE  Lab Results  Component Value Date   HGBA1C 6.3 (H) 01/22/2024    Urinalysis    Component Value Date/Time   COLORURINE STRAW (A) 12/03/2021 1507   APPEARANCEUR CLEAR 12/03/2021 1507   LABSPEC 1.005 12/03/2021 1507   PHURINE 7.0 12/03/2021 1507   GLUCOSEU NEGATIVE 12/03/2021 1507   HGBUR SMALL (A) 12/03/2021 1507   BILIRUBINUR NEGATIVE 12/03/2021 1507   KETONESUR NEGATIVE 12/03/2021 1507   PROTEINUR  NEGATIVE  12/03/2021 1507   NITRITE NEGATIVE 12/03/2021 1507   LEUKOCYTESUR NEGATIVE 12/03/2021 1507    Lab Results  Component Value Date   LABMICR 5.5 01/13/2023   BACTERIA NONE SEEN 12/03/2021    Pertinent Imaging:  Results for orders placed during the hospital encounter of 12/22/22  DG Abd 1 View  Narrative CLINICAL DATA:  Cough. COPD. Tracheostomy tube. Abdominal distention  EXAM: ABDOMEN - 1 VIEW; CHEST - 2 VIEW  COMPARISON:  Chest x-ray 12/25/2021. abdominal x-ray 11/26/2021  FINDINGS: Chest x-ray: Stable tracheostomy tube. Slight linear opacity identified at the right lung base likely scar or atelectasis. No consolidation, pneumothorax, effusion or edema. Normal cardiopericardial silhouette. Degenerative changes of the spine on lateral view. Frontal view is rotated to the left.  Abdominal x-ray: Gas seen in nondilated loops of small and large bowel. Surgical changes along the central pelvis. There are some well rounded density seen in the pelvis which are indeterminate although possibly vascular. Mild air in the stomach.  IMPRESSION: Basilar atelectasis.  Tracheostomy tube.  Nonspecific bowel gas pattern.   Electronically Signed By: Ranell Bring M.D. On: 12/27/2022 13:25    Assessment & Plan:    BPH, luts - wean off bethanechol  over the next month. On tams BID and terazosin  at bedtime. Will stop teraz next and then finasteride  and then wean off BID tams.   No follow-ups on file.  Donnice Brooks, MD  [facility]  [address] [place], [place] [address] [phone]

## 2024-02-04 ENCOUNTER — Other Ambulatory Visit: Payer: Self-pay

## 2024-02-04 DIAGNOSIS — J449 Chronic obstructive pulmonary disease, unspecified: Secondary | ICD-10-CM

## 2024-02-04 MED ORDER — BETHANECHOL CHLORIDE 25 MG PO TABS
50.0000 mg | ORAL_TABLET | Freq: Four times a day (QID) | ORAL | 0 refills | Status: DC
  Filled 2024-02-04: qty 720, 90d supply, fill #0

## 2024-02-04 MED ORDER — VENTOLIN HFA 108 (90 BASE) MCG/ACT IN AERS
1.0000 | INHALATION_SPRAY | RESPIRATORY_TRACT | 1 refills | Status: DC | PRN
  Filled 2024-02-04: qty 18, 17d supply, fill #0
  Filled 2024-02-25: qty 18, 17d supply, fill #1
  Filled 2024-02-25: qty 18, 17d supply, fill #0
  Filled 2024-02-25: qty 18, 17d supply, fill #1

## 2024-02-05 ENCOUNTER — Other Ambulatory Visit: Payer: Self-pay | Admitting: Registered Nurse

## 2024-02-05 ENCOUNTER — Other Ambulatory Visit: Payer: Self-pay

## 2024-02-05 ENCOUNTER — Other Ambulatory Visit (HOSPITAL_COMMUNITY): Payer: Self-pay

## 2024-02-05 DIAGNOSIS — G8929 Other chronic pain: Secondary | ICD-10-CM

## 2024-02-05 DIAGNOSIS — G894 Chronic pain syndrome: Secondary | ICD-10-CM

## 2024-02-05 MED ORDER — OXYCODONE HCL ER 15 MG PO T12A
15.0000 mg | EXTENDED_RELEASE_TABLET | Freq: Two times a day (BID) | ORAL | 0 refills | Status: DC
  Filled 2024-02-05 – 2024-02-22 (×5): qty 60, 30d supply, fill #0

## 2024-02-05 MED ORDER — OXYCODONE HCL 5 MG PO TABS
5.0000 mg | ORAL_TABLET | ORAL | 0 refills | Status: DC | PRN
  Filled 2024-02-05: qty 120, 20d supply, fill #0

## 2024-02-05 NOTE — Telephone Encounter (Signed)
 PMP was Reviewed.  Oxycodone  e-scribed to the pharmacy, Charles Weeks  Mr. Leverette sister is aware.

## 2024-02-07 ENCOUNTER — Other Ambulatory Visit: Payer: Self-pay | Admitting: Internal Medicine

## 2024-02-07 DIAGNOSIS — K219 Gastro-esophageal reflux disease without esophagitis: Secondary | ICD-10-CM

## 2024-02-08 ENCOUNTER — Other Ambulatory Visit (HOSPITAL_COMMUNITY): Payer: Self-pay

## 2024-02-08 ENCOUNTER — Other Ambulatory Visit: Payer: Self-pay

## 2024-02-08 MED ORDER — PANTOPRAZOLE SODIUM 40 MG PO TBEC
40.0000 mg | DELAYED_RELEASE_TABLET | Freq: Every day | ORAL | 1 refills | Status: AC
  Filled 2024-02-08: qty 90, 90d supply, fill #0
  Filled 2024-05-07: qty 90, 90d supply, fill #1

## 2024-02-10 ENCOUNTER — Other Ambulatory Visit (HOSPITAL_COMMUNITY): Payer: Self-pay

## 2024-02-10 ENCOUNTER — Telehealth: Payer: Self-pay | Admitting: Registered Nurse

## 2024-02-10 DIAGNOSIS — G894 Chronic pain syndrome: Secondary | ICD-10-CM

## 2024-02-10 DIAGNOSIS — M545 Low back pain, unspecified: Secondary | ICD-10-CM

## 2024-02-10 MED ORDER — OXYCODONE HCL 5 MG PO TABS
5.0000 mg | ORAL_TABLET | ORAL | 0 refills | Status: DC | PRN
  Filled 2024-02-10 – 2024-03-04 (×2): qty 120, 20d supply, fill #0

## 2024-02-10 NOTE — Telephone Encounter (Signed)
 PMP was Reviewed, Oxycodone  prescription sent to pharmacy.  My-Chart message sent to [name] Mr. Mattix sister.

## 2024-02-11 ENCOUNTER — Other Ambulatory Visit (HOSPITAL_COMMUNITY): Payer: Self-pay

## 2024-02-12 ENCOUNTER — Other Ambulatory Visit: Payer: Self-pay | Admitting: Urology

## 2024-02-12 ENCOUNTER — Other Ambulatory Visit (HOSPITAL_COMMUNITY): Payer: Self-pay

## 2024-02-12 ENCOUNTER — Other Ambulatory Visit: Payer: Self-pay

## 2024-02-12 ENCOUNTER — Telehealth: Payer: Self-pay

## 2024-02-12 ENCOUNTER — Encounter (INDEPENDENT_AMBULATORY_CARE_PROVIDER_SITE_OTHER): Payer: Self-pay | Admitting: Gastroenterology

## 2024-02-12 DIAGNOSIS — S22008S Other fracture of unspecified thoracic vertebra, sequela: Secondary | ICD-10-CM

## 2024-02-12 MED ORDER — UNDERPADS EXTRA LARGE MISC
1.0000 | Freq: Two times a day (BID) | 11 refills | Status: AC
Start: 2024-02-12 — End: ?

## 2024-02-12 NOTE — Telephone Encounter (Signed)
 Copied from CRM #[id number]. Topic: Clinical - Order For Equipment >> Feb 12, 2024 11:27 AM Darshell M wrote: Reason for CRM: Patient's sister called and was very upset. She has requested a prescription for chucks for the patient but the ordering agency has not received. She would like some to leave a prescription and the desk and she will have the patient's nurse pick it up.

## 2024-02-15 ENCOUNTER — Other Ambulatory Visit (INDEPENDENT_AMBULATORY_CARE_PROVIDER_SITE_OTHER): Payer: Self-pay | Admitting: Gastroenterology

## 2024-02-15 DIAGNOSIS — R110 Nausea: Secondary | ICD-10-CM

## 2024-02-15 MED ORDER — TAMSULOSIN HCL 0.4 MG PO CAPS
0.4000 mg | ORAL_CAPSULE | Freq: Two times a day (BID) | ORAL | 3 refills | Status: AC
  Filled 2024-02-15 – 2024-02-16 (×2): qty 180, 90d supply, fill #0
  Filled 2024-05-13: qty 180, 90d supply, fill #1

## 2024-02-15 MED ORDER — ONDANSETRON HCL 4 MG PO TABS
4.0000 mg | ORAL_TABLET | Freq: Three times a day (TID) | ORAL | 1 refills | Status: AC | PRN
  Filled 2024-02-15: qty 90, 30d supply, fill #0
  Filled 2024-05-23 – 2024-05-24 (×2): qty 90, 30d supply, fill #1

## 2024-02-15 NOTE — Telephone Encounter (Signed)
Advised with verbal understanding

## 2024-02-15 NOTE — Telephone Encounter (Signed)
 Will call and let them know it is up front and ready to be picked up

## 2024-02-15 NOTE — Addendum Note (Signed)
 Addended by: NIEVES DONNICE SAUNDERS on: 02/15/2024 07:43 PM   Modules accepted: Orders

## 2024-02-15 NOTE — Telephone Encounter (Signed)
 Called and left voicemail that it is ready to be picked up

## 2024-02-16 ENCOUNTER — Other Ambulatory Visit: Payer: Self-pay

## 2024-02-16 ENCOUNTER — Telehealth: Payer: Self-pay

## 2024-02-16 ENCOUNTER — Other Ambulatory Visit (HOSPITAL_COMMUNITY): Payer: Self-pay

## 2024-02-16 NOTE — Telephone Encounter (Signed)
*  Pulm  Pharmacy Patient Advocate Encounter  Received notification from [place] MEDICAID that Prior Authorization for Formoterol  Neb Solution has been APPROVED from 02/16/2024 to 02/15/2025   PA #/Case ID/Reference #: [id number] W

## 2024-02-18 ENCOUNTER — Telehealth (HOSPITAL_COMMUNITY): Payer: Self-pay

## 2024-02-18 ENCOUNTER — Other Ambulatory Visit (HOSPITAL_COMMUNITY): Payer: Self-pay

## 2024-02-18 ENCOUNTER — Other Ambulatory Visit: Payer: Self-pay

## 2024-02-18 ENCOUNTER — Encounter: Payer: Self-pay | Admitting: Registered Nurse

## 2024-02-18 ENCOUNTER — Other Ambulatory Visit (HOSPITAL_BASED_OUTPATIENT_CLINIC_OR_DEPARTMENT_OTHER): Payer: Self-pay

## 2024-02-18 ENCOUNTER — Encounter: Attending: Physical Medicine & Rehabilitation | Admitting: Registered Nurse

## 2024-02-18 VITALS — BP 114/79 | HR 88 | Ht 68.0 in | Wt 194.0 lb

## 2024-02-18 DIAGNOSIS — Z79899 Other long term (current) drug therapy: Secondary | ICD-10-CM | POA: Diagnosis present

## 2024-02-18 DIAGNOSIS — Z5181 Encounter for therapeutic drug level monitoring: Secondary | ICD-10-CM | POA: Diagnosis not present

## 2024-02-18 DIAGNOSIS — M545 Low back pain, unspecified: Secondary | ICD-10-CM | POA: Insufficient documentation

## 2024-02-18 DIAGNOSIS — G894 Chronic pain syndrome: Secondary | ICD-10-CM | POA: Diagnosis not present

## 2024-02-18 DIAGNOSIS — G8929 Other chronic pain: Secondary | ICD-10-CM | POA: Diagnosis present

## 2024-02-18 DIAGNOSIS — M546 Pain in thoracic spine: Secondary | ICD-10-CM | POA: Insufficient documentation

## 2024-02-18 NOTE — Progress Notes (Signed)
 Subjective:    Patient ID: Charles Weeks, male    DOB: [birth year], 58 y.o.   MRN: [id number]  HPI: Charles Weeks is a 58 y.o. male who returns for follow up appointment for chronic pain and medication refill. He states his pain is located in his mid- lower back. He rates his pain 9. His current exercise regime is walking and performing stretching exercises.  Charles Weeks  equivalent is 90.00 MME. He is also prescribed Clonazepam  .We have discussed the black box warning of using opioids and benzodiazepines. I highlighted the dangers of using these drugs together and discussed the adverse events including respiratory suppression, overdose, cognitive impairment and importance of compliance with current regimen. We will continue to monitor and adjust as indicated.   Last Oral Swab was Performed on 12/22/2023,it was consistent.    Pain Inventory Average Pain 8-9 Pain Right Now 9 My pain is burning, sharp  In the last 24 hours, has pain interfered with the following? General activity 7 Relation with others 2 Enjoyment of life 5 What TIME of day is your pain at its worst? morning , daytime, evening, and night Sleep (in general) Fair  Pain is worse with: walking, bending, sitting, standing, and some activites Pain improves with: N/A Relief from Meds: 5  Family History  Problem Relation Age of Onset   Heart disease Mother    Heart disease Father    Diabetes Sister    Social History   Socioeconomic History   Marital status: Divorced    Spouse name: Not on file   Number of children: 2   Years of education: Not on file   Highest education level: 7th grade  Occupational History   Not on file  Tobacco Use   Smoking status: Former    Current packs/day: 0.00    Types: Cigarettes    Quit date: 08/17/2020    Years since quitting: 3.5   Smokeless tobacco: Never  Vaping Use   Vaping status: Never Used  Substance and Sexual Activity   Alcohol use: Not Currently   Drug use:  Not Currently   Sexual activity: Not on file  Other Topics Concern   Not on file  Social History Narrative   ** Merged History Encounter **       Lives with sister  Both daughters live close by   Sister has dog   Enjoys: shooting pool, fishing, Therapist, nutritional  Diet: eats all food groups Caffeine: pop-dr pepper 2 liter  Water : 4-6 cups   Wears seat belt  No driving for him Psychologist, sport and exercise  Fi   re Extinguisher No weapons        Social Drivers of Corporate investment banker Strain: Low Risk  (04/21/2023)   Overall Financial Resource Strain (CARDIA)    Difficulty of Paying Living Expenses: Not hard at all  Food Insecurity: No Food Insecurity (04/21/2023)   Hunger Vital Sign    Worried About Running Out of Food in the Last Year: Never true    Ran Out of Food in the Last Year: Never true  Transportation Needs: No Transportation Needs (04/21/2023)   PRAPARE - Administrator, Civil Service (Medical): No    Lack of Transportation (Non-Medical): No  Physical Activity: Unknown (04/21/2023)   Exercise Vital Sign    Days of Exercise per Week: 0 days    Minutes of Exercise per Session: Not on file  Stress: No Stress Concern Present (04/21/2023)   [employer] of Occupational  Health - Occupational Stress Questionnaire    Feeling of Stress : Only a little  Social Connections: Socially Isolated (04/21/2023)   Social Connection and Isolation Panel    Frequency of Communication with Friends and Family: More than three times a week    Frequency of Social Gatherings with Friends and Family: Once a week    Attends Religious Services: Never    Database administrator or Organizations: No    Attends Engineer, structural: Not on file    Marital Status: Divorced   Past Surgical History:  Procedure Laterality Date   BALLOON DILATION N/A 02/06/2021   Procedure: BALLOON DILATION;  Surgeon: Carlie Clark, MD;  Location: [facility] OR;  Service: ENT;  Laterality: N/A;   BOWEL  RESECTION  2006   COLONOSCOPY  03/2022   [facility]   MICROLARYNGOSCOPY WITH LASER AND BALLOON DILATION N/A 02/06/2021   Procedure: SUSPENDED MICRO DIRECT LARYNGOSCOPY;  Surgeon: Carlie Clark, MD;  Location: [facility] OR;  Service: ENT;  Laterality: N/A;   PEG PLACEMENT N/A 09/03/2020   Procedure: PERCUTANEOUS ENDOSCOPIC GASTROSTOMY (PEG) PLACEMENT;  Surgeon: Sebastian Moles, MD;  Location: [facility] OR;  Service: General;  Laterality: N/A;   PERCUTANEOUS TRACHEOSTOMY N/A 09/03/2020   Procedure: PERCUTANEOUS TRACHEOSTOMY USING 6 SHILEY;  Surgeon: Sebastian Moles, MD;  Location: [facility] OR;  Service: General;  Laterality: N/A;   SKIN GRAFT     TONSILLECTOMY AND ADENOIDECTOMY     Past Surgical History:  Procedure Laterality Date   BALLOON DILATION N/A 02/06/2021   Procedure: BALLOON DILATION;  Surgeon: Carlie Clark, MD;  Location: [facility] OR;  Service: ENT;  Laterality: N/A;   BOWEL RESECTION  2006   COLONOSCOPY  03/2022   [facility]   MICROLARYNGOSCOPY WITH LASER AND BALLOON DILATION N/A 02/06/2021   Procedure: SUSPENDED MICRO DIRECT LARYNGOSCOPY;  Surgeon: Carlie Clark, MD;  Location: [facility] OR;  Service: ENT;  Laterality: N/A;   PEG PLACEMENT N/A 09/03/2020   Procedure: PERCUTANEOUS ENDOSCOPIC GASTROSTOMY (PEG) PLACEMENT;  Surgeon: Sebastian Moles, MD;  Location: [facility] OR;  Service: General;  Laterality: N/A;   PERCUTANEOUS TRACHEOSTOMY N/A 09/03/2020   Procedure: PERCUTANEOUS TRACHEOSTOMY USING 6 SHILEY;  Surgeon: Sebastian Moles, MD;  Location[facility] OR;  Service: General;  Laterality: N/A;   SKIN GRAFT     TONSILLECTOMY AND ADENOIDECTOMY     Past Medical History:  Diagnosis Date   Anxiety 01/19/2020   Arthritis    Asthma    Benign prostatic hyperplasia with weak urinary stream 12/08/2019   BPH (benign prostatic hyperplasia) 08/17/2020   Chronic respiratory failure with hypoxia (HCC) 01/24/2020   Has home 02 but not using as of 01/24/2020  -  01/24/2020   Walked RA  approx   200 ft  @ slow pace  stopped due to  Dizzy with  sats still 95%      Cigarette smoker 01/24/2020   Stopped regular smoking 10/2019 but still now and then as of 01/24/2020    COPD (chronic obstructive pulmonary disease) (HCC) 08/17/2020   gold   COPD GOLD ?     Quit smoking 10/2019  - Labs ordered 01/24/2020  :  allergy profile   alpha one AT phenotype   - 01/24/2020  After extensive coaching inhaler device,  effectiveness =    75% (short Ti) > change symb 80 to 160 2bid      Coronary artery disease 06/08/2018   DES to RCA, [facility] [place], [place])   Depression, major, single episode, moderate (HCC) 01/19/2020  Dyspnea    Encounter for screening for malignant neoplasm of colon 12/08/2019   Essential hypertension 12/08/2019   History of MI (myocardial infarction) 01/19/2020   HTN (hypertension) 08/17/2020   Myocardial infarct (HCC) 2019   Pneumonia    Positive colorectal cancer screening using Cologuard test    BP 114/79 (BP Location: Left Arm, Patient Position: Sitting, Cuff Size: Normal)   Pulse 88   Ht 5' 8 (1.727 m)   Wt 194 lb (88 kg)   SpO2 96%   BMI 29.50 kg/m   Opioid Risk Score:   Fall Risk Score:  `1  Depression screen PHQ 2/9     12/22/2023    1:27 PM 10/23/2023    1:43 PM 10/21/2023    1:46 PM 08/18/2023    1:18 PM 06/25/2023    1:21 PM 04/21/2023    1:10 PM 02/18/2023    1:13 PM  Depression screen PHQ 2/9  Decreased Interest 3 0 1 2 2  0 0  Down, Depressed, Hopeless 3 0 1 2 2  0 0  PHQ - 2 Score 6 0 2 4 4  0 0  Altered sleeping 3 0 1      Tired, decreased energy 3 0 1      Change in appetite  0 0      Feeling bad or failure about yourself  0 0 1      Trouble concentrating 0 0 1      Moving slowly or fidgety/restless  0 0      Suicidal thoughts 0 0 0      PHQ-9 Score 12 0 6      Difficult doing work/chores Very difficult Not difficult at all Somewhat difficult          Review of Systems  Musculoskeletal:  Positive for back pain.       Objective:   Physical Exam Vitals and nursing note reviewed.   Constitutional:      Appearance: Normal appearance.  Cardiovascular:     Rate and Rhythm: Normal rate and regular rhythm.     Pulses: Normal pulses.     Heart sounds: Normal heart sounds.  Pulmonary:     Effort: Pulmonary effort is normal.     Breath sounds: Normal breath sounds.  Musculoskeletal:     Comments: Normal Muscle Bulk and Muscle Testing Reveals:  Upper Extremities: Full ROM and Muscle Strength 5/5  Thoracic Paraspinal Tenderness: T-7-T-10 Lumbar Paraspinal Tenderness: L-4-L-5 Right Greater Trochanter Tenderness Lower Extremities: Full ROM and Muscle Strength 5/5 Arises from Table with ease Narrow Based Gait     Skin:    General: Skin is warm and dry.  Neurological:     Mental Status: He is alert and oriented to person, place, and time.  Psychiatric:        Mood and Affect: Mood normal.        Behavior: Behavior normal.          Assessment & Plan:  1.Thoracic Spine Fracture:  Continue HEP as Tolerated. Continue to Monitor. 02/18/2024 2. 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 Continue to Monitor. 02/18/2024 3.Subglottic Stenosis/Tracheostomy: Jamal Chu with 2 liters of Oxygen: Dr. Llewellyn Following. Continue to Monitor. 02/18/2024 4. Urinary Retention due to BPH: Continue current medication regimen with Hytrin , Flomax  and Urecholine . 02/18/2024 4. Chronic  Pain:Syndrome Refilled: Oxycontin  15 mg every 12 hours# 60 and  and Oxycodone  5 mg 4 times a day as needed for pain #120.  We  will continue the opioid monitoring program, this consists of regular clinic visits, examinations, urine drug screen, pill counts as well as use of [place]  Controlled Substance Reporting system. A 12 month History has been reviewed on the [place]  Controlled Substance Reporting System on 02/18/2024 5. Slow Transit Constipation: Continue with Bowel Regimen. Continue to Monitor. 02/18/2024   F/U in 2 months

## 2024-02-18 NOTE — Telephone Encounter (Signed)
 PA request has been Received. New Encounter has been or will be created for follow up. For additional info see Pharmacy Prior Auth telephone encounter from 02/18/24.

## 2024-02-18 NOTE — Telephone Encounter (Signed)
 Pharmacy Patient Advocate Encounter   Received notification from Pt Calls Messages that prior authorization for Oxycontin  ER 15 mg tablets is required/requested.   Insurance verification completed.   The patient is insured through [facility] MEDICAID.   Per test claim: PA required; PA submitted to above mentioned insurance via Latent Key/confirmation #/EOC [id number] Status is pending  *submitted to [employer] via Latent

## 2024-02-19 ENCOUNTER — Other Ambulatory Visit (HOSPITAL_COMMUNITY): Payer: Self-pay

## 2024-02-22 ENCOUNTER — Other Ambulatory Visit: Payer: Self-pay

## 2024-02-22 ENCOUNTER — Other Ambulatory Visit (HOSPITAL_COMMUNITY): Payer: Self-pay

## 2024-02-24 ENCOUNTER — Other Ambulatory Visit: Payer: Self-pay

## 2024-02-24 ENCOUNTER — Other Ambulatory Visit (HOSPITAL_COMMUNITY): Payer: Self-pay

## 2024-02-24 NOTE — Telephone Encounter (Signed)
 Pharmacy Patient Advocate Encounter  Received notification from [place] MEDICAID that Prior Authorization for Oxycontin  ER 15 mg tablets  has been APPROVED from 02/23/24 to 05/18/24. Ran test claim, Copay is $0. This test claim was processed through [facility]- copay amounts may vary at other pharmacies due to pharmacy/plan contracts, or as the patient moves through the different stages of their insurance plan.   PA #/Case ID/Reference #:  Carl

## 2024-02-25 ENCOUNTER — Other Ambulatory Visit: Payer: Self-pay

## 2024-02-25 ENCOUNTER — Other Ambulatory Visit (HOSPITAL_COMMUNITY): Payer: Self-pay

## 2024-02-25 ENCOUNTER — Other Ambulatory Visit (HOSPITAL_BASED_OUTPATIENT_CLINIC_OR_DEPARTMENT_OTHER): Payer: Self-pay

## 2024-02-29 ENCOUNTER — Encounter (HOSPITAL_COMMUNITY): Payer: Self-pay

## 2024-02-29 ENCOUNTER — Other Ambulatory Visit: Payer: Self-pay

## 2024-02-29 ENCOUNTER — Inpatient Hospital Stay (HOSPITAL_COMMUNITY)
Admission: EM | Admit: 2024-02-29 | Discharge: 2024-03-04 | DRG: 871 | Disposition: A | Discharge status: Home / Self Care | Attending: Family Medicine | Admitting: Family Medicine

## 2024-02-29 ENCOUNTER — Emergency Department (HOSPITAL_COMMUNITY)

## 2024-02-29 DIAGNOSIS — F410 Panic disorder [episodic paroxysmal anxiety] without agoraphobia: Secondary | ICD-10-CM | POA: Diagnosis present

## 2024-02-29 DIAGNOSIS — K589 Irritable bowel syndrome without diarrhea: Secondary | ICD-10-CM | POA: Diagnosis present

## 2024-02-29 DIAGNOSIS — M4854XA Collapsed vertebra, not elsewhere classified, thoracic region, initial encounter for fracture: Secondary | ICD-10-CM | POA: Diagnosis present

## 2024-02-29 DIAGNOSIS — J441 Chronic obstructive pulmonary disease with (acute) exacerbation: Principal | ICD-10-CM | POA: Diagnosis present

## 2024-02-29 DIAGNOSIS — J440 Chronic obstructive pulmonary disease with acute lower respiratory infection: Secondary | ICD-10-CM | POA: Diagnosis present

## 2024-02-29 DIAGNOSIS — Z87891 Personal history of nicotine dependence: Secondary | ICD-10-CM

## 2024-02-29 DIAGNOSIS — I10 Essential (primary) hypertension: Secondary | ICD-10-CM | POA: Diagnosis present

## 2024-02-29 DIAGNOSIS — A419 Sepsis, unspecified organism: Secondary | ICD-10-CM | POA: Diagnosis present

## 2024-02-29 DIAGNOSIS — Z833 Family history of diabetes mellitus: Secondary | ICD-10-CM | POA: Diagnosis not present

## 2024-02-29 DIAGNOSIS — J9811 Atelectasis: Secondary | ICD-10-CM | POA: Diagnosis present

## 2024-02-29 DIAGNOSIS — I2510 Atherosclerotic heart disease of native coronary artery without angina pectoris: Secondary | ICD-10-CM | POA: Diagnosis present

## 2024-02-29 DIAGNOSIS — I252 Old myocardial infarction: Secondary | ICD-10-CM | POA: Diagnosis not present

## 2024-02-29 DIAGNOSIS — J470 Bronchiectasis with acute lower respiratory infection: Secondary | ICD-10-CM | POA: Diagnosis present

## 2024-02-29 DIAGNOSIS — J189 Pneumonia, unspecified organism: Secondary | ICD-10-CM | POA: Diagnosis not present

## 2024-02-29 DIAGNOSIS — G894 Chronic pain syndrome: Secondary | ICD-10-CM | POA: Diagnosis present

## 2024-02-29 DIAGNOSIS — R6520 Severe sepsis without septic shock: Secondary | ICD-10-CM | POA: Diagnosis present

## 2024-02-29 DIAGNOSIS — J9621 Acute and chronic respiratory failure with hypoxia: Secondary | ICD-10-CM | POA: Diagnosis present

## 2024-02-29 DIAGNOSIS — N401 Enlarged prostate with lower urinary tract symptoms: Secondary | ICD-10-CM | POA: Diagnosis present

## 2024-02-29 DIAGNOSIS — J159 Unspecified bacterial pneumonia: Secondary | ICD-10-CM | POA: Diagnosis present

## 2024-02-29 DIAGNOSIS — Z1152 Encounter for screening for COVID-19: Secondary | ICD-10-CM

## 2024-02-29 DIAGNOSIS — E785 Hyperlipidemia, unspecified: Secondary | ICD-10-CM | POA: Diagnosis present

## 2024-02-29 DIAGNOSIS — Z930 Tracheostomy status: Secondary | ICD-10-CM | POA: Diagnosis not present

## 2024-02-29 DIAGNOSIS — Z8249 Family history of ischemic heart disease and other diseases of the circulatory system: Secondary | ICD-10-CM | POA: Diagnosis not present

## 2024-02-29 DIAGNOSIS — R7303 Prediabetes: Secondary | ICD-10-CM | POA: Diagnosis present

## 2024-02-29 LAB — URINALYSIS, ROUTINE W REFLEX MICROSCOPIC
Bilirubin Urine: NEGATIVE
Glucose, UA: NEGATIVE mg/dL
Hgb urine dipstick: NEGATIVE
Ketones, ur: NEGATIVE mg/dL
Leukocytes,Ua: NEGATIVE
Nitrite: NEGATIVE
Protein, ur: NEGATIVE mg/dL
Specific Gravity, Urine: 1.043 — ABNORMAL HIGH (ref 1.005–1.030)
pH: 7 (ref 5.0–8.0)

## 2024-02-29 LAB — TROPONIN T, HIGH SENSITIVITY
Troponin T High Sensitivity: <15 ng/L (ref 0–19)
Troponin T High Sensitivity: <15 ng/L (ref 0–19)

## 2024-02-29 LAB — CBC WITH DIFFERENTIAL/PLATELET
Abs Immature Granulocytes: 0.05 K/uL (ref 0.00–0.07)
Basophils Absolute: 0 K/uL (ref 0.0–0.1)
Basophils Relative: 0 %
Eosinophils Absolute: 0.3 K/uL (ref 0.0–0.5)
Eosinophils Relative: 2 %
HCT: 42.2 % (ref 39.0–52.0)
Hemoglobin: 14.1 g/dL (ref 13.0–17.0)
Immature Granulocytes: 0 %
Lymphocytes Relative: 9 %
Lymphs Abs: 1.2 K/uL (ref 0.7–4.0)
MCH: 30.1 pg (ref 26.0–34.0)
MCHC: 33.4 g/dL (ref 30.0–36.0)
MCV: 90.2 fL (ref 80.0–100.0)
Monocytes Absolute: 1.3 K/uL — ABNORMAL HIGH (ref 0.1–1.0)
Monocytes Relative: 9 %
Neutro Abs: 10.9 K/uL — ABNORMAL HIGH (ref 1.7–7.7)
Neutrophils Relative %: 80 %
Platelets: 212 K/uL (ref 150–400)
RBC: 4.68 MIL/uL (ref 4.22–5.81)
RDW: 12.2 % (ref 11.5–15.5)
WBC: 13.7 K/uL — ABNORMAL HIGH (ref 4.0–10.5)
nRBC: 0 % (ref 0.0–0.2)

## 2024-02-29 LAB — RESP PANEL BY RT-PCR (RSV, FLU A&B, COVID)  RVPGX2
Influenza A by PCR: NEGATIVE
Influenza B by PCR: NEGATIVE
Resp Syncytial Virus by PCR: NEGATIVE
SARS Coronavirus 2 by RT PCR: NEGATIVE

## 2024-02-29 LAB — CBC
HCT: 39 % (ref 39.0–52.0)
Hemoglobin: 13 g/dL (ref 13.0–17.0)
MCH: 30.6 pg (ref 26.0–34.0)
MCHC: 33.3 g/dL (ref 30.0–36.0)
MCV: 91.8 fL (ref 80.0–100.0)
Platelets: 203 K/uL (ref 150–400)
RBC: 4.25 MIL/uL (ref 4.22–5.81)
RDW: 12.4 % (ref 11.5–15.5)
WBC: 12.9 K/uL — ABNORMAL HIGH (ref 4.0–10.5)
nRBC: 0 % (ref 0.0–0.2)

## 2024-02-29 LAB — BASIC METABOLIC PANEL WITH GFR
Anion gap: 10 (ref 5–15)
BUN: 8 mg/dL (ref 6–20)
CO2: 32 mmol/L (ref 22–32)
Calcium: 9.6 mg/dL (ref 8.9–10.3)
Chloride: 99 mmol/L (ref 98–111)
Creatinine, Ser: 0.99 mg/dL (ref 0.61–1.24)
GFR, Estimated: >60 mL/min (ref >60)
Glucose, Bld: 164 mg/dL — ABNORMAL HIGH (ref 70–99)
Potassium: 4 mmol/L (ref 3.5–5.1)
Sodium: 141 mmol/L (ref 135–145)

## 2024-02-29 LAB — LACTIC ACID, PLASMA
Lactic Acid, Venous: 2.2 mmol/L (ref 0.5–1.9)
Lactic Acid, Venous: 1.5 mmol/L (ref 0.5–1.9)

## 2024-02-29 LAB — CREATININE, SERUM
Creatinine, Ser: 0.93 mg/dL (ref 0.61–1.24)
GFR, Estimated: >60 mL/min (ref >60)

## 2024-02-29 LAB — GLUCOSE, CAPILLARY
Glucose-Capillary: 131 mg/dL — ABNORMAL HIGH (ref 70–99)
Glucose-Capillary: 161 mg/dL — ABNORMAL HIGH (ref 70–99)

## 2024-02-29 LAB — HIV ANTIBODY (ROUTINE TESTING W REFLEX): HIV Screen 4th Generation wRfx: NONREACTIVE

## 2024-02-29 LAB — MAGNESIUM: Magnesium: 2.1 mg/dL (ref 1.7–2.4)

## 2024-02-29 LAB — PHOSPHORUS: Phosphorus: 3.3 mg/dL (ref 2.5–4.6)

## 2024-02-29 LAB — PRO BRAIN NATRIURETIC PEPTIDE: Pro Brain Natriuretic Peptide: 118 pg/mL (ref <300.0)

## 2024-02-29 LAB — MRSA NEXT GEN BY PCR, NASAL: MRSA by PCR Next Gen: NOT DETECTED

## 2024-02-29 MED ORDER — OXYCODONE HCL ER 15 MG PO T12A
15.0000 mg | EXTENDED_RELEASE_TABLET | Freq: Two times a day (BID) | ORAL | Status: DC
  Administered 2024-02-29 – 2024-03-04 (×8): 15 mg via ORAL
  Filled 2024-02-29 (×8): qty 1

## 2024-02-29 MED ORDER — ASPIRIN 81 MG PO CHEW
81.0000 mg | CHEWABLE_TABLET | Freq: Every day | ORAL | Status: DC
  Administered 2024-03-01 – 2024-03-04 (×4): 81 mg via ORAL
  Filled 2024-02-29 (×4): qty 1

## 2024-02-29 MED ORDER — BETHANECHOL CHLORIDE 25 MG PO TABS
50.0000 mg | ORAL_TABLET | Freq: Two times a day (BID) | ORAL | Status: DC
  Administered 2024-02-29 – 2024-03-04 (×8): 50 mg via ORAL
  Filled 2024-02-29 (×10): qty 2

## 2024-02-29 MED ORDER — FINASTERIDE 5 MG PO TABS
5.0000 mg | ORAL_TABLET | Freq: Every day | ORAL | Status: DC
  Administered 2024-03-01 – 2024-03-04 (×4): 5 mg via ORAL
  Filled 2024-02-29 (×5): qty 1

## 2024-02-29 MED ORDER — BUSPIRONE HCL 5 MG PO TABS
10.0000 mg | ORAL_TABLET | Freq: Two times a day (BID) | ORAL | Status: DC
  Administered 2024-02-29 – 2024-03-04 (×8): 10 mg via ORAL
  Filled 2024-02-29 (×10): qty 2

## 2024-02-29 MED ORDER — ENOXAPARIN SODIUM 40 MG/0.4ML IJ SOSY
40.0000 mg | PREFILLED_SYRINGE | INTRAMUSCULAR | Status: DC
  Administered 2024-02-29 – 2024-03-03 (×4): 40 mg via SUBCUTANEOUS
  Filled 2024-02-29 (×4): qty 0.4

## 2024-02-29 MED ORDER — SODIUM CHLORIDE 0.9 % IV SOLN
1.0000 g | Freq: Once | INTRAVENOUS | Status: AC
  Administered 2024-02-29: 1 g via INTRAVENOUS
  Filled 2024-02-29: qty 10

## 2024-02-29 MED ORDER — SCOPOLAMINE 1 MG/3DAYS TD PT72
1.0000 | MEDICATED_PATCH | TRANSDERMAL | Status: DC
  Administered 2024-02-29 – 2024-03-03 (×2): 1 mg via TRANSDERMAL
  Filled 2024-02-29 (×2): qty 1

## 2024-02-29 MED ORDER — QUETIAPINE FUMARATE 25 MG PO TABS
150.0000 mg | ORAL_TABLET | Freq: Every day | ORAL | Status: DC
  Administered 2024-03-01 – 2024-03-03 (×3): 150 mg via ORAL
  Filled 2024-02-29: qty 1
  Filled 2024-02-29 (×3): qty 2

## 2024-02-29 MED ORDER — DM-GUAIFENESIN ER 30-600 MG PO TB12
1.0000 | ORAL_TABLET | Freq: Two times a day (BID) | ORAL | Status: DC
  Administered 2024-02-29 – 2024-03-04 (×9): 1 via ORAL
  Filled 2024-02-29 (×9): qty 1

## 2024-02-29 MED ORDER — LACTATED RINGERS IV BOLUS
1000.0000 mL | Freq: Once | INTRAVENOUS | Status: AC
  Administered 2024-02-29: 1000 mL via INTRAVENOUS

## 2024-02-29 MED ORDER — SENNA 8.6 MG PO TABS
1.0000 | ORAL_TABLET | Freq: Two times a day (BID) | ORAL | Status: DC
  Administered 2024-02-29 – 2024-03-04 (×8): 8.6 mg via ORAL
  Filled 2024-02-29 (×8): qty 1

## 2024-02-29 MED ORDER — POLYETHYLENE GLYCOL 3350 17 G PO PACK: 17.0000 g | PACK | Freq: Two times a day (BID) | ORAL | Status: DC

## 2024-02-29 MED ORDER — INSULIN ASPART 100 UNIT/ML IJ SOLN
0.0000 [IU] | Freq: Three times a day (TID) | INTRAMUSCULAR | Status: DC
  Administered 2024-02-29 – 2024-03-01 (×2): 2 [IU] via SUBCUTANEOUS
  Administered 2024-03-01: 5 [IU] via SUBCUTANEOUS
  Administered 2024-03-01 – 2024-03-02 (×2): 3 [IU] via SUBCUTANEOUS
  Administered 2024-03-02: 2 [IU] via SUBCUTANEOUS
  Administered 2024-03-02 – 2024-03-03 (×3): 3 [IU] via SUBCUTANEOUS

## 2024-02-29 MED ORDER — POLYETHYLENE GLYCOL 3350 17 G PO PACK
17.0000 g | PACK | Freq: Every day | ORAL | Status: DC
  Administered 2024-03-01 – 2024-03-04 (×4): 17 g via ORAL
  Filled 2024-02-29 (×4): qty 1

## 2024-02-29 MED ORDER — OXYCODONE HCL 5 MG PO TABS
5.0000 mg | ORAL_TABLET | Freq: Four times a day (QID) | ORAL | Status: DC | PRN
  Administered 2024-02-29 – 2024-03-03 (×8): 5 mg via ORAL
  Filled 2024-02-29 (×8): qty 1

## 2024-02-29 MED ORDER — METHYLPREDNISOLONE SODIUM SUCC 40 MG IJ SOLR
40.0000 mg | Freq: Two times a day (BID) | INTRAMUSCULAR | Status: DC
  Administered 2024-02-29 – 2024-03-03 (×6): 40 mg via INTRAVENOUS
  Filled 2024-02-29 (×6): qty 1

## 2024-02-29 MED ORDER — SODIUM CHLORIDE 0.9 % IV SOLN
500.0000 mg | INTRAVENOUS | Status: DC
  Administered 2024-03-01 – 2024-03-03 (×3): 500 mg via INTRAVENOUS
  Filled 2024-02-29 (×3): qty 5

## 2024-02-29 MED ORDER — SODIUM CHLORIDE 0.9 % IV SOLN
2.0000 g | Freq: Three times a day (TID) | INTRAVENOUS | Status: DC
  Administered 2024-02-29 – 2024-03-03 (×8): 2 g via INTRAVENOUS
  Filled 2024-02-29 (×9): qty 12.5

## 2024-02-29 MED ORDER — SODIUM CHLORIDE 0.9 % IV SOLN: INTRAVENOUS | Status: AC

## 2024-02-29 MED ORDER — BETHANECHOL CHLORIDE 25 MG PO TABS
50.0000 mg | ORAL_TABLET | Freq: Four times a day (QID) | ORAL | Status: DC
Start: 2024-02-29 — End: 2024-02-29

## 2024-02-29 MED ORDER — IOHEXOL 300 MG/ML  SOLN
80.0000 mL | Freq: Once | INTRAMUSCULAR | Status: AC | PRN
  Administered 2024-02-29: 80 mL via INTRAVENOUS

## 2024-02-29 MED ORDER — SODIUM CHLORIDE 0.9 % IV SOLN
500.0000 mg | Freq: Once | INTRAVENOUS | Status: AC
  Administered 2024-02-29: 500 mg via INTRAVENOUS
  Filled 2024-02-29: qty 5

## 2024-02-29 MED ORDER — BUPROPION HCL ER (XL) 150 MG PO TB24
300.0000 mg | ORAL_TABLET | Freq: Every day | ORAL | Status: DC
  Administered 2024-03-01 – 2024-03-04 (×4): 300 mg via ORAL
  Filled 2024-02-29 (×4): qty 2

## 2024-02-29 MED ORDER — CLONAZEPAM 0.5 MG PO TABS
0.5000 mg | ORAL_TABLET | Freq: Every day | ORAL | Status: DC
  Administered 2024-02-29 – 2024-03-03 (×4): 0.5 mg via ORAL
  Filled 2024-02-29 (×4): qty 1

## 2024-02-29 MED ORDER — OXYCODONE HCL 5 MG PO TABS
5.0000 mg | ORAL_TABLET | Freq: Once | ORAL | Status: AC
  Administered 2024-02-29: 5 mg via ORAL
  Filled 2024-02-29: qty 1

## 2024-02-29 MED ORDER — QUETIAPINE FUMARATE 100 MG PO TABS
300.0000 mg | ORAL_TABLET | Freq: Every day | ORAL | Status: DC
  Administered 2024-02-29 – 2024-03-03 (×4): 300 mg via ORAL
  Filled 2024-02-29 (×4): qty 3

## 2024-02-29 MED ORDER — METOPROLOL SUCCINATE ER 25 MG PO TB24
12.5000 mg | ORAL_TABLET | Freq: Every day | ORAL | Status: DC
  Administered 2024-03-01 – 2024-03-04 (×4): 12.5 mg via ORAL
  Filled 2024-02-29 (×4): qty 1

## 2024-02-29 MED ORDER — INSULIN ASPART 100 UNIT/ML IJ SOLN: 0.0000 [IU] | Freq: Every day | INTRAMUSCULAR | Status: DC

## 2024-02-29 MED ORDER — QUETIAPINE FUMARATE 25 MG PO TABS
150.0000 mg | ORAL_TABLET | Freq: Every day | ORAL | Status: DC
  Administered 2024-03-01 – 2024-03-04 (×4): 150 mg via ORAL
  Filled 2024-02-29 (×4): qty 2

## 2024-02-29 MED ORDER — CHLORHEXIDINE GLUCONATE CLOTH 2 % EX PADS
6.0000 | MEDICATED_PAD | Freq: Every day | CUTANEOUS | Status: DC
  Administered 2024-03-02 – 2024-03-04 (×2): 6 via TOPICAL

## 2024-02-29 MED ORDER — PANTOPRAZOLE SODIUM 40 MG PO TBEC
40.0000 mg | DELAYED_RELEASE_TABLET | Freq: Every day | ORAL | Status: DC
  Administered 2024-02-29 – 2024-03-04 (×5): 40 mg via ORAL
  Filled 2024-02-29 (×5): qty 1

## 2024-02-29 MED ORDER — MONTELUKAST SODIUM 10 MG PO TABS
10.0000 mg | ORAL_TABLET | Freq: Every day | ORAL | Status: DC
  Administered 2024-02-29 – 2024-03-03 (×4): 10 mg via ORAL
  Filled 2024-02-29 (×4): qty 1

## 2024-02-29 NOTE — ED Notes (Signed)
 Pt sister in room at this time. States pt PCP now Dr. Talitha Repress @ [facility], at least for the last 2 weeks.

## 2024-02-29 NOTE — ED Provider Notes (Signed)
 Charles Weeks Provider Note   CSN: [id number] Arrival date & time: 02/29/24  9143     Patient presents with: Shortness of Breath   Charles Weeks is a 58 y.o. male.  He is brought in by EMS from home for short of breath for the last few days with productive cough.  He has a history of COPD and uses home oxygen 2 to 3 L.  He has a tracheostomy.  Does not use a ventilator.  No fever.  Cough has been more productive and having more difficulty suctioning over the last few days.  No chest pain.  No hemoptysis.  EMS gave DuoNeb and route.  Patient feeling a little bit better after getting suctioned on arrival here.   The history is provided by the patient and the EMS personnel.  Shortness of Breath Severity:  Moderate Onset quality:  Gradual Duration:  3 days Timing:  Constant Progression:  Worsening Chronicity:  Recurrent Relieved by:  Nothing Worsened by:  Activity and coughing Ineffective treatments:  Oxygen Associated symptoms: cough and sputum production   Associated symptoms: no abdominal pain, no chest pain, no fever and no hemoptysis   Risk factors: no tobacco use        Prior to Admission medications   Medication Sig Start Date End Date Taking? Authorizing Provider  acetaminophen  (TYLENOL ) 325 MG tablet Take 2 tablets (650 mg total) by mouth every 6 (six) hours as needed for mild pain or moderate pain. 10/30/20   Angiulli, Toribio PARAS, PA-C  albuterol  (PROVENTIL ) (2.5 MG/3ML) 0.083% nebulizer solution Inhale 1 vial (3 mLs) into the lungs via nebulizer every 6 (six) hours as needed for wheezing or shortness of breath 12/28/23   Darlean Ozell NOVAK, MD  aspirin  81 MG chewable tablet Chew 1 tablet (81 mg total) by mouth daily. 11/01/20   Angiulli, Toribio PARAS, PA-C  bethanechol  (URECHOLINE ) 25 MG tablet Take 2 tablets (50 mg total) by mouth 4 (four) times daily. 02/04/24   Bevely Doffing, FNP  budesonide  (PULMICORT ) 0.25 MG/2ML nebulizer solution  Generic for Pulmicort . Inhale one vial in nebulizer twice a day. Rinse mouth after use. 12/14/23   Darlean Ozell NOVAK, MD  buPROPion  (WELLBUTRIN  XL) 300 MG 24 hr tablet Take 1 tablet (300 mg total) by mouth daily. 01/25/24   Bevely Doffing, FNP  busPIRone  (BUSPAR ) 10 MG tablet Take 1 tablet (10 mg total) by mouth 2 (two) times daily. 12/25/23   Bevely Doffing, FNP  Cholecalciferol (VITAMIN D3) 125 MCG (5000 UT) CAPS Take 5,000 Units by mouth daily.    [provider]  clonazePAM  (KLONOPIN ) 0.5 MG tablet Take 1 tablet (0.5 mg total) by mouth at bedtime. 12/22/23   Debby Fidela CROME, NP  finasteride  (PROSCAR ) 5 MG tablet Take 1 tablet (5 mg total) by mouth daily. 08/24/23   Nieves Cough, MD  formoterol  (PERFOROMIST ) 20 MCG/2ML nebulizer solution Substituted for: Perforomist  Solution Inhale one vial in nebulizer twice a day. 12/14/23   Darlean Ozell NOVAK, MD  Incontinence Supply Disposable (UNDERPADS EXTRA LARGE) MISC 1 Pad by Does not apply route in the morning and at bedtime. 02/12/24   Bevely Doffing, FNP  methylPREDNISolone  (MEDROL  DOSEPAK) 4 MG TBPK tablet Take as package instructions. 12/15/23   Bevely Doffing, FNP  metoCLOPramide  (REGLAN ) 5 MG tablet Take 1 tablet (5 mg total) by mouth every 6 (six) hours as needed for nausea. 12/24/22   Rudy Josette RAMAN, PA-C  metoprolol  succinate (TOPROL -XL) 25 MG 24 hr  tablet Take 0.5 tablets (12.5 mg total) by mouth daily. 12/04/23   Debera Jayson MATSU, MD  Misc. Devices (TRANSPORT [place]) MISC For transporting to and from Doctors appointments 09/06/21   Tobie Suzzane POUR, MD  montelukast  (SINGULAIR ) 10 MG tablet Take 1 tablet (10 mg total) by mouth at bedtime. 05/18/23   Melvenia Manus BRAVO, MD  nitroGLYCERIN  (NITROSTAT ) 0.4 MG SL tablet Place 1 tablet (0.4 mg total) under the tongue every 5 (five) minutes as needed for chest pain. 11/25/23   Debera Jayson MATSU, MD  nystatin ointment (MYCOSTATIN) APPLY AROUND TRACH SITE TWICE DAILY FOR 2 WEEKS. MIX WITH TRIAMCINOLONE . 12/03/22    [provider]  ondansetron  (ZOFRAN ) 4 MG tablet Take 1 tablet (4 mg total) by mouth every 8 (eight) hours as needed for nausea or vomiting. 02/15/24   Eartha Flavors, Toribio, MD  oxyCODONE  (OXYCONTIN ) 15 mg 12 hr tablet Take 1 tablet (15 mg total) by mouth every 12 (twelve) hours. Do Not fill before 02/17/2024 02/17/24   Debby Fidela CROME, NP  oxyCODONE  (ROXICODONE ) 5 MG immediate release tablet Take 1 tablet (5 mg total) by mouth every 4 (four) hours as needed for severe pain (pain score 7-10). 02/10/24   Debby Fidela CROME, NP  OXYGEN Inhale 3 L into the lungs continuous.    [provider]  pantoprazole  (PROTONIX ) 40 MG tablet Take 1 tablet (40 mg total) by mouth daily. 02/08/24   Bevely Doffing, FNP  polyethylene glycol powder (GOODSENSE CLEARLAX) 17 GM/SCOOP powder Take 17 g by mouth 2 (two) to 3 (three) times daily as needed. 07/13/23   Eartha Flavors Toribio, MD  QUEtiapine  (SEROQUEL ) 300 MG tablet Take 0.5 tablets (150 mg total) by mouth 2 (two) times daily AND 1 tablet (300 mg total) at bedtime. 04/27/23   Babs Arthea DASEN, MD  rosuvastatin  (CRESTOR ) 10 MG tablet Take 1 tablet (10 mg total) by mouth daily. 10/22/23   Dixon, Phillip E, MD  senna (SENOKOT) 8.6 MG tablet Take 1 tablet by mouth at bedtime.    [provider]  tamsulosin  (FLOMAX ) 0.4 MG CAPS capsule Take 1 capsule (0.4 mg total) by mouth 2 (two) times daily. 02/15/24   Nieves Cough, MD  terazosin  (HYTRIN ) 5 MG capsule Take 1 capsule (5 mg total) by mouth at bedtime. 08/24/23   Nieves Cough, MD  triamcinolone  ointment (KENALOG ) 0.1 % APPLY AROUND TRACH SITE TWICE DAILY FOR 2 WEEKS. MIX WITH NYSTATIN. 12/03/22   [provider]  UNABLE TO FIND 1 each by Does not apply route daily. Med Name: LIFT CHAIR 08/26/23   Melvenia Manus BRAVO, MD  UNABLE TO FIND 1 each by Does not apply route daily. Med Name: BATHROOM RENOVATION FOR WALK IN SHOWER 08/26/23   Melvenia Manus BRAVO, MD  VENTOLIN  HFA 108 [phone] Base)  MCG/ACT inhaler Inhale 1-2 puffs into the lungs every 4 (four) hours as needed FOR WHEEZING OR SHORTNESS OF BREATH 02/04/24   Bevely Doffing, FNP  glycopyrrolate  (ROBINUL ) 1 MG tablet Take 1 tablet (1 mg total) by mouth 3 (three) times daily. 10/30/20 10/31/20  Angiulli, Toribio PARAS, PA-C    Allergies: Ciprofloxacin     Review of Systems  Constitutional:  Negative for fever.  Respiratory:  Positive for cough, sputum production and shortness of breath. Negative for hemoptysis.   Cardiovascular:  Negative for chest pain.  Gastrointestinal:  Negative for abdominal pain.    Updated Vital Signs BP 138/76   Pulse 92   Temp 98.3 F (36.8 C) (Axillary)  Resp 16   Ht 5' 8 (1.727 m)   Wt 87.1 kg   SpO2 97%   BMI 29.20 kg/m   Physical Exam Vitals and nursing note reviewed.  Constitutional:      General: He is not in acute distress.    Appearance: He is well-developed.  HENT:     Head: Normocephalic and atraumatic.  Eyes:     Conjunctiva/sclera: Conjunctivae normal.  Neck:     Comments: Tracheostomy in place.  No erythema or crepitus Cardiovascular:     Rate and Rhythm: Normal rate and regular rhythm.     Heart sounds: No murmur heard. Pulmonary:     Effort: Accessory muscle usage present. No respiratory distress.     Breath sounds: Rhonchi present.  Abdominal:     Palpations: Abdomen is soft.     Tenderness: There is no abdominal tenderness. There is no guarding or rebound.  Musculoskeletal:        General: No swelling.     Cervical back: Neck supple.     Right lower leg: No tenderness. No edema.     Left lower leg: No tenderness. No edema.  Skin:    General: Skin is warm and dry.     Capillary Refill: Capillary refill takes less than 2 seconds.  Neurological:     General: No focal deficit present.     Mental Status: He is alert.     (all labs ordered are listed, but only abnormal results are displayed) Labs Reviewed  BASIC METABOLIC PANEL WITH GFR - Abnormal; Notable for  the following components:      Result Value   Glucose, Bld 164 (*)    All other components within normal limits  CBC WITH DIFFERENTIAL/PLATELET - Abnormal; Notable for the following components:   WBC 13.7 (*)    Neutro Abs 10.9 (*)    Monocytes Absolute 1.3 (*)    All other components within normal limits  LACTIC ACID, PLASMA - Abnormal; Notable for the following components:   Lactic Acid, Venous 2.2 (*)    All other components within normal limits  URINALYSIS, ROUTINE W REFLEX MICROSCOPIC - Abnormal; Notable for the following components:   Specific Gravity, Urine 1.043 (*)    All other components within normal limits  CBC - Abnormal; Notable for the following components:   WBC 12.9 (*)    All other components within normal limits  GLUCOSE, CAPILLARY - Abnormal; Notable for the following components:   Glucose-Capillary 131 (*)    All other components within normal limits  CULTURE, BLOOD (ROUTINE X 2)  CULTURE, BLOOD (ROUTINE X 2)  RESP PANEL BY RT-PCR (RSV, FLU A&B, COVID)  RVPGX2  EXPECTORATED SPUTUM ASSESSMENT W GRAM STAIN, RFLX TO RESP C  MRSA NEXT GEN BY PCR, NASAL  PRO BRAIN NATRIURETIC PEPTIDE  LACTIC ACID, PLASMA  CREATININE, SERUM  MAGNESIUM   PHOSPHORUS  HIV ANTIBODY (ROUTINE TESTING W REFLEX)  BASIC METABOLIC PANEL WITH GFR  CBC  TROPONIN T, HIGH SENSITIVITY  TROPONIN T, HIGH SENSITIVITY    EKG: EKG Interpretation Date/Time:  Monday February 29 2024 09:03:08 EDT Ventricular Rate:  101 PR Interval:  168 QRS Duration:  93 QT Interval:  342 QTC Calculation: 444 R Axis:   75  Text Interpretation: Sinus tachycardia Low voltage, precordial leads Nonspecific T abnormalities, lateral leads No significant change since prior 12/24 Confirmed by Towana Sharper [phone]) on 02/29/2024 9:12:40 AM  Radiology: CT Chest W Contrast Result Date: 02/29/2024 CLINICAL DATA:  Congestion, hypoxia  EXAM: CT CHEST WITH CONTRAST TECHNIQUE: Multidetector CT imaging of the chest was  performed during intravenous contrast administration. RADIATION DOSE REDUCTION: This exam was performed according to the departmental dose-optimization program which includes automated exposure control, adjustment of the mA and/or kV according to patient size and/or use of iterative reconstruction technique. CONTRAST:  80mL OMNIPAQUE  IOHEXOL  300 MG/ML  SOLN COMPARISON:  09/15/2023 FINDINGS: Cardiovascular: Aortic and left anterior descending coronary and right coronary atheromatous vascular calcifications. Mediastinum/Nodes: Unremarkable Lungs/Pleura: Tracheostomy tube noted. Emphysema. Old granulomatous disease. Along a band of scarring in the right lower lobe there is a new 2.5 by 2.2 cm nodular component on image 95 series 3. Possibilities include rounded atelectasis or malignancy forming along a scar. Consider PET-CT for differentiation. Bilateral airway thickening most striking in the lower lobes were there is substantial airway plugging. Upper Abdomen: Unremarkable Musculoskeletal: Nonunited left lateral tenth rib fracture noted. Remote compression fractures at T8-9 with interbody fusion at T8-9 and kyphotic angulation, unchanged from previous. IMPRESSION: 1. New 2.5 by 2.2 cm nodular component along a band of scarring in the right lower lobe. Possibilities include rounded atelectasis or malignancy forming along a scar. PET-CT is recommended for differentiation. 2. Bilateral airway thickening most striking in the lower lobes were there is substantial airway plugging. 3. Nonunited left lateral tenth rib fracture. 4. Remote compression fractures at T8-9 with interbody fusion at T8-9 and kyphotic angulation, unchanged from previous. 5. Aortic Atherosclerosis (ICD10-I70.0) and Emphysema (ICD10-J43.9). Electronically Signed   By: Ryan Salvage M.D.   On: 02/29/2024 12:42   DG Chest Port 1 View Result Date: 02/29/2024 CLINICAL DATA:  Shortness of breath. EXAM: PORTABLE CHEST 1 VIEW COMPARISON:  12/24/2022  FINDINGS: Left lung clear. Right base atelectasis or scarring evident. Focal consolidative opacity identified in the medial right base. Cardiopericardial silhouette is at upper limits of normal for size. No acute bony abnormality. Tracheostomy tube evident. IMPRESSION: Focal consolidative opacity in the medial right base. Pneumonia not excluded. No mass lesion at this location on lung cancer screening chest CT 09/15/2023, but follow-up recommended to ensure resolution. Electronically Signed   By: Camellia Candle M.D.   On: 02/29/2024 09:22     Procedures   Medications Ordered in the ED  enoxaparin  (LOVENOX ) injection 40 mg (has no administration in time range)  0.9 %  sodium chloride  infusion ( Intravenous Infusion Verify 02/29/24 1540)  methylPREDNISolone  sodium succinate (SOLU-MEDROL ) 40 mg/mL injection 40 mg (40 mg Intravenous Given 02/29/24 1437)  ceFEPIme (MAXIPIME) 2 g in sodium chloride  0.9 % 100 mL IVPB (has no administration in time range)  pantoprazole  (PROTONIX ) EC tablet 40 mg (40 mg Oral Given 02/29/24 1436)  dextromethorphan -guaiFENesin  (MUCINEX  DM) 30-600 MG per 12 hr tablet 1 tablet (1 tablet Oral Given 02/29/24 1436)  insulin  aspart (novoLOG ) injection 0-15 Units (has no administration in time range)  insulin  aspart (novoLOG ) injection 0-5 Units (has no administration in time range)  Chlorhexidine  Gluconate Cloth 2 % PADS 6 each (has no administration in time range)  scopolamine  (TRANSDERM-SCOP) 1 MG/3DAYS 1 mg (has no administration in time range)  lactated ringers  bolus 1,000 mL (0 mLs Intravenous Stopped 02/29/24 1351)  cefTRIAXone  (ROCEPHIN ) 1 g in sodium chloride  0.9 % 100 mL IVPB (0 g Intravenous Stopped 02/29/24 1145)  azithromycin  (ZITHROMAX ) 500 mg in sodium chloride  0.9 % 250 mL IVPB (0 mg Intravenous Stopped 02/29/24 1317)  iohexol  (OMNIPAQUE ) 300 MG/ML solution 80 mL (80 mLs Intravenous Contrast Given 02/29/24 1155)  oxyCODONE  (Oxy IR/ROXICODONE ) immediate release  tablet 5 mg (5 mg Oral Given 02/29/24 1312)    Clinical Course as of 02/29/24 1711  Mon Feb 29, 2024  0930 Chest x-ray interpreted by me is possible right base infiltrate.  Awaiting radiology reading. [MB]  1329 Patient is a agreeable to plan for admission.  Discussed with [facility] hospitalist Dr. Ricky who will evaluate for admission.  Updated sister at bedside. [MB]    Clinical Course User Index [MB] Towana Ozell BROCKS, MD                                 Medical Decision Making Amount and/or Complexity of Data Reviewed Labs: ordered. Radiology: ordered.  Risk Prescription drug management. Decision regarding hospitalization.   This patient complains of cough and shortness of breath; this involves an extensive number of treatment Options and is a complaint that carries with it a high risk of complications and morbidity. The differential includes pneumonia, bronchitis, CHF, COVID, flu, obstruction  I ordered, reviewed and interpreted labs, which included CBC with elevated white count, chemistries elevated glucose, lactate mildly elevated but cleared, troponins flat, urinalysis negative, blood culture sent, COVID and flu negative I ordered medication IV antibiotics and reviewed PMP when indicated. I ordered imaging studies which included chest x-ray and CT chest and I independently    visualized and interpreted imaging which showed lower lobe atelectasis versus mass, bronchial thickening Additional history obtained from patient's sister and caregiver Previous records obtained and reviewed in epic including recent PCP notes pulmonology notes I consulted [facility] hospitalist Dr. Ricky and discussed lab and imaging findings and discussed disposition.  Cardiac monitoring reviewed, sinus rhythm Social determinants considered, depression social isolation Critical Interventions: None  After the interventions stated above, I reevaluated the patient and found patient to be breathing more  comfortably but still with cough and needing suctioning Admission and further testing considered, patient would benefit from admission to the hospital for further respiratory management.  He is in agreement with plan for admission.      Final diagnoses:  COPD exacerbation [facility])    ED Discharge Orders     None          Towana Ozell BROCKS, MD 02/29/24 1714

## 2024-02-29 NOTE — ED Triage Notes (Signed)
 Pt caretaker called EMS due to SOB. States trach has been congested for a few days and she wasn't able to get his O2 up to 90% even after 1.5 albuterol  txs. EMS gave Duoneb in route to hospital.

## 2024-02-29 NOTE — H&P (Signed)
 History and Physical    Patient: Charles Weeks FMW:[id number] DOB: 03/18/1966 DOA: 02/29/2024 DOS: the patient was seen and examined on 02/29/2024 PCP: Bevely Doffing, FNP   Patient coming from: Home  Chief Complaint:  Chief Complaint  Patient presents with   Shortness of Breath   HPI: Charles Weeks is a 58 y.o. 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  No fever but reporting chills; there has not been any hemoptysis, chest pain, nausea, vomiting, abdominal pain, diarrhea, dysuria, hematuria, melena, hematochezia, focal weaknesses or any other complaints.  On arrival to the ED patient chest images demonstrating the presence of new infiltrate suggesting pneumonia along with emphysematous changes/bronchitis.  Patient met criteria for sepsis at time of admission with reported chills (questionable low-grade fever at home), elevated respiratory rate and also elevated WBCs.  Cultures has been taken, antibiotics started and TRH consulted to place patient in the hospital for further evaluation and management of COPD exacerbation and pneumonia.   Review of Systems: As mentioned in the history of present illness. All other systems reviewed and are negative. Past Medical History:  Diagnosis Date   Anxiety 01/19/2020   Arthritis    Asthma    Benign prostatic hyperplasia with weak urinary stream 12/08/2019   BPH (benign prostatic hyperplasia) 08/17/2020   Chronic respiratory failure with hypoxia (HCC) 01/24/2020   Has home 02 but not using as of 01/24/2020  -  01/24/2020   Walked RA  approx   200 ft  @ slow pace  stopped due to  Dizzy with sats still 95%      Cigarette smoker 01/24/2020    Stopped regular smoking 10/2019 but still now and then as of 01/24/2020    COPD (chronic obstructive pulmonary disease) (HCC) 08/17/2020   gold   COPD GOLD ?     Quit smoking 10/2019  - Labs ordered 01/24/2020  :  allergy profile   alpha one AT phenotype   - 01/24/2020  After extensive coaching inhaler device,  effectiveness =    75% (short Ti) > change symb 80 to 160 2bid      Coronary artery disease 06/08/2018   DES to RCA, [facility] [place]   Depression, major, single episode, moderate (HCC) 01/19/2020   Dyspnea    Encounter for screening for malignant neoplasm of colon 12/08/2019   Essential hypertension 12/08/2019   History of MI (myocardial infarction) 01/19/2020   HTN (hypertension) 08/17/2020   Myocardial infarct (HCC) 2019   Pneumonia    Positive colorectal cancer screening using Cologuard test    Past Surgical History:  Procedure Laterality Date   BALLOON DILATION N/A 02/06/2021   Procedure: BALLOON DILATION;  Surgeon: Carlie Clark, MD;  Location[facility] OR;  Service: ENT;  Laterality: N/A;   BOWEL RESECTION  2006   COLONOSCOPY  03/2022   [facility]   MICROLARYNGOSCOPY WITH LASER AND BALLOON DILATION N/A 02/06/2021   Procedure: SUSPENDED MICRO DIRECT LARYNGOSCOPY;  Surgeon: Carlie Clark, MD;  Location: [facility] OR;  Service: ENT;  Laterality: N/A;   PEG PLACEMENT N/A 09/03/2020   Procedure: PERCUTANEOUS ENDOSCOPIC GASTROSTOMY (PEG) PLACEMENT;  Surgeon: Sebastian Moles, MD;  Location: [facility] OR;  Service: General;  Laterality: N/A;   PERCUTANEOUS TRACHEOSTOMY N/A 09/03/2020   Procedure: PERCUTANEOUS TRACHEOSTOMY USING 6  SHILEY;  Surgeon: Sebastian Moles, MD;  Location: [facility] OR;  Service: General;  Laterality: N/A;   SKIN GRAFT     TONSILLECTOMY AND ADENOIDECTOMY     Social History:  reports that he quit smoking about 3 years ago. His smoking use included cigarettes. He has never used smokeless tobacco. He reports that he does not currently use alcohol. He reports that he does  not currently use drugs.  Allergies  Allergen Reactions   Ciprofloxacin  Hives    Family History  Problem Relation Age of Onset   Heart disease Mother    Heart disease Father    Diabetes Sister     Prior to Admission medications   Medication Sig Start Date End Date Taking? Authorizing Provider  acetaminophen  (TYLENOL ) 325 MG tablet Take 2 tablets (650 mg total) by mouth every 6 (six) hours as needed for mild pain or moderate pain. 10/30/20   Angiulli, Toribio PARAS, PA-C  albuterol  (PROVENTIL ) (2.5 MG/3ML) 0.083% nebulizer solution Inhale 1 vial (3 mLs) into the lungs via nebulizer every 6 (six) hours as needed for wheezing or shortness of breath 12/28/23   Darlean Ozell NOVAK, MD  aspirin  81 MG chewable tablet Chew 1 tablet (81 mg total) by mouth daily. 11/01/20   Angiulli, Toribio PARAS, PA-C  bethanechol  (URECHOLINE ) 25 MG tablet Take 2 tablets (50 mg total) by mouth 4 (four) times daily. 02/04/24   Bevely Doffing, FNP  budesonide  (PULMICORT ) 0.25 MG/2ML nebulizer solution Generic for Pulmicort . Inhale one vial in nebulizer twice a day. Rinse mouth after use. 12/14/23   Darlean Ozell NOVAK, MD  buPROPion  (WELLBUTRIN  XL) 300 MG 24 hr tablet Take 1 tablet (300 mg total) by mouth daily. 01/25/24   Bevely Doffing, FNP  busPIRone  (BUSPAR ) 10 MG tablet Take 1 tablet (10 mg total) by mouth 2 (two) times daily. 12/25/23   Bevely Doffing, FNP  Cholecalciferol (VITAMIN D3) 125 MCG (5000 UT) CAPS Take 5,000 Units by mouth daily.    [provider]  clonazePAM  (KLONOPIN ) 0.5 MG tablet Take 1 tablet (0.5 mg total) by mouth at bedtime. 12/22/23   Debby Fidela CROME, NP  finasteride  (PROSCAR ) 5 MG tablet Take 1 tablet (5 mg total) by mouth daily. 08/24/23   Nieves Cough, MD  formoterol  (PERFOROMIST ) 20 MCG/2ML nebulizer solution Substituted for: Perforomist  Solution Inhale one vial in nebulizer twice a day. 12/14/23   Darlean Ozell NOVAK, MD  Incontinence Supply Disposable (UNDERPADS EXTRA LARGE) MISC 1 Pad by Does not apply  route in the morning and at bedtime. 02/12/24   Bevely Doffing, FNP  methylPREDNISolone  (MEDROL  DOSEPAK) 4 MG TBPK tablet Take as package instructions. 12/15/23   Bevely Doffing, FNP  metoCLOPramide  (REGLAN ) 5 MG tablet Take 1 tablet (5 mg total) by mouth every 6 (six) hours as needed for nausea. 12/24/22   Rudy Josette RAMAN, PA-C  metoprolol  succinate (TOPROL -XL) 25 MG 24 hr tablet Take 0.5 tablets (12.5 mg total) by mouth daily. 12/04/23   Debera Jayson MATSU, MD  Misc. Devices (TRANSPORT [place]) MISC For transporting to and from Doctors appointments 09/06/21   Tobie Suzzane POUR, MD  montelukast  (SINGULAIR ) 10 MG tablet Take 1 tablet (10 mg total) by mouth at bedtime. 05/18/23   Melvenia Manus BRAVO, MD  nitroGLYCERIN  (NITROSTAT ) 0.4 MG SL tablet Place 1 tablet (0.4 mg total) under the tongue every 5 (five) minutes as needed for chest pain. 11/25/23   Debera Jayson MATSU, MD  nystatin ointment (MYCOSTATIN) APPLY AROUND TRACH SITE TWICE DAILY FOR 2 WEEKS.  MIX WITH TRIAMCINOLONE . 12/03/22   [provider]  ondansetron  (ZOFRAN ) 4 MG tablet Take 1 tablet (4 mg total) by mouth every 8 (eight) hours as needed for nausea or vomiting. 02/15/24   Eartha Flavors, Toribio, MD  oxyCODONE  (OXYCONTIN ) 15 mg 12 hr tablet Take 1 tablet (15 mg total) by mouth every 12 (twelve) hours. Do Not fill before 02/17/2024 02/17/24   Debby Fidela CROME, NP  oxyCODONE  (ROXICODONE ) 5 MG immediate release tablet Take 1 tablet (5 mg total) by mouth every 4 (four) hours as needed for severe pain (pain score 7-10). 02/10/24   Debby Fidela CROME, NP  OXYGEN Inhale 3 L into the lungs continuous.    [provider]  pantoprazole  (PROTONIX ) 40 MG tablet Take 1 tablet (40 mg total) by mouth daily. 02/08/24   Bevely Doffing, FNP  polyethylene glycol powder (GOODSENSE CLEARLAX) 17 GM/SCOOP powder Take 17 g by mouth 2 (two) to 3 (three) times daily as needed. 07/13/23   Eartha Flavors Toribio, MD  QUEtiapine  (SEROQUEL ) 300 MG tablet Take 0.5  tablets (150 mg total) by mouth 2 (two) times daily AND 1 tablet (300 mg total) at bedtime. 04/27/23   Babs Arthea DASEN, MD  rosuvastatin  (CRESTOR ) 10 MG tablet Take 1 tablet (10 mg total) by mouth daily. 10/22/23   Dixon, Phillip E, MD  senna (SENOKOT) 8.6 MG tablet Take 1 tablet by mouth at bedtime.    [provider]  tamsulosin  (FLOMAX ) 0.4 MG CAPS capsule Take 1 capsule (0.4 mg total) by mouth 2 (two) times daily. 02/15/24   Nieves Cough, MD  terazosin  (HYTRIN ) 5 MG capsule Take 1 capsule (5 mg total) by mouth at bedtime. 08/24/23   Nieves Cough, MD  triamcinolone  ointment (KENALOG ) 0.1 % APPLY AROUND TRACH SITE TWICE DAILY FOR 2 WEEKS. MIX WITH NYSTATIN. 12/03/22   [provider]  UNABLE TO FIND 1 each by Does not apply route daily. Med Name: LIFT CHAIR 08/26/23   Melvenia Manus BRAVO, MD  UNABLE TO FIND 1 each by Does not apply route daily. Med Name: BATHROOM RENOVATION FOR WALK IN SHOWER 08/26/23   Melvenia Manus BRAVO, MD  VENTOLIN  HFA 108 [phone] Base) MCG/ACT inhaler Inhale 1-2 puffs into the lungs every 4 (four) hours as needed FOR WHEEZING OR SHORTNESS OF BREATH 02/04/24   Bevely Doffing, FNP  glycopyrrolate  (ROBINUL ) 1 MG tablet Take 1 tablet (1 mg total) by mouth 3 (three) times daily. 10/30/20 10/31/20  Pegge Toribio PARAS, PA-C    Physical Exam: Vitals:   02/29/24 1315 02/29/24 1330 02/29/24 1346 02/29/24 1354  BP:      Pulse: 85 92 86   Resp: 18 15 16    Temp:    97.8 F (36.6 C)  TempSrc:    Axillary  SpO2: 97% 95% 97%   Weight:      Height:       General exam: Alert, awake, following commands appropriately; increased upper respiratory secretion appreciated. Respiratory system: Posterior rhonchi bilaterally; no using accessory muscles.  Trach collar in place; 8 L oxygen supplementation required to maintain saturation. Cardiovascular system:RRR. No murmurs, rubs, gallops.  No JVD. Gastrointestinal system: Abdomen is soft, nontender, positive bowel sounds.  No guarding  on exam. Central nervous system: Moving 4 limbs spontaneously; No focal neurological deficits. Extremities: No C/C/E, +pedal pulses Skin: No petechiae. Psychiatry: Mood & affect appropriate.   Data Reviewed: CBC: WBCs 13.7, hemoglobin 13.0 platelet count 203K Urinalysis: Demonstrating a specific gravity 1.043, negative nitrites, negative leukocyte esterase.  Lactic acid: 2.2 >>1.5 BNP:118 Basic metabolic panel: Sodium 141, potassium 4.0, chloride 99, bicarb 32, BUN 8, creatinine 0.99 and GFR >60 Respiratory panel by PCR: Negative for COVID, influenza and RSV.    Assessment and Plan: 1-sepsis secondary to bronchiectasis/pneumonia -IV antibiotic has been started (using cefepime and Zithromax ; patient high risk for Pseudomonas infection). - Fluid resuscitation provided in the ED - Cultures taken and will follow results - Continue supportive care and follow clinical response.  2-acute on chronic respiratory failure with hypoxia in the setting of COPD exacerbation and bronchiectasis/pneumonia - Treatment for infection as mentioned above - Scopolamine  patch will be provided to assist with increased upper airway secretions; patient will be allow after discussing risk with personal aspiration to use home aspirator. - Steroids, bronchodilators/nebulizer, mucolytic's, flutter valve and incentive spirometer has been ordered. - Wean off oxygen supplementation as tolerated; patient uses 3-4 L at baseline.  3-prediabetes - Sliding scale insulin  will be followed while inpatient - Recent A1c 6.3 - Patient was following diet control as an outpatient - Expecting CBGs elevation with the use of his steroids; will follow fluctuation.  4-GERD/GI prophylaxis - Continue PPI  5-chronic pain syndrome/thoracic spine compression fractures - Continue home analgesic therapy  6-BPH/irritable bowel -Continue Proscar  and bethanechol   7-anxiety/panic attack - Resume home anxiolytic therapy.    Advance  Care Planning:   Code Status: Full Code   Consults: None  Family Communication: Patient's daughter and medical power of attorney at bedside.  Severity of Illness: The appropriate patient status for this patient is INPATIENT. Inpatient status is judged to be reasonable and necessary in order to provide the required intensity of service to ensure the patient's safety. The patient's presenting symptoms, physical exam findings, and initial radiographic and laboratory data in the context of their chronic comorbidities is felt to place them at high risk for further clinical deterioration. Furthermore, it is not anticipated that the patient will be medically stable for discharge from the hospital within 2 midnights of admission.   * I certify that at the point of admission it is my clinical judgment that the patient will require inpatient hospital care spanning beyond 2 midnights from the point of admission due to high intensity of service, high risk for further deterioration and high frequency of surveillance required.*  Author: Eric Nunnery, MD 02/29/2024 1:54 PM  For on call review [email or web address].

## 2024-02-29 NOTE — Plan of Care (Signed)

## 2024-02-29 NOTE — Progress Notes (Signed)
 Patient family requesting tracheal suction catheters be left at beside so patient can access when needed. RT explained to patient that suctioning is a sterile procedure and should be treated as such to prevent patient infection. Patient family states she did not care about that and wanted them at bedside. RT continued to inform patient this was a policy of the hospital and family continues to explain they do not care. RN aware.

## 2024-03-01 DIAGNOSIS — J9621 Acute and chronic respiratory failure with hypoxia: Secondary | ICD-10-CM | POA: Diagnosis not present

## 2024-03-01 DIAGNOSIS — J441 Chronic obstructive pulmonary disease with (acute) exacerbation: Secondary | ICD-10-CM | POA: Diagnosis not present

## 2024-03-01 DIAGNOSIS — J189 Pneumonia, unspecified organism: Secondary | ICD-10-CM

## 2024-03-01 LAB — CBC
HCT: 36.2 % — ABNORMAL LOW (ref 39.0–52.0)
Hemoglobin: 12 g/dL — ABNORMAL LOW (ref 13.0–17.0)
MCH: 30.2 pg (ref 26.0–34.0)
MCHC: 33.1 g/dL (ref 30.0–36.0)
MCV: 91.2 fL (ref 80.0–100.0)
Platelets: 180 K/uL (ref 150–400)
RBC: 3.97 MIL/uL — ABNORMAL LOW (ref 4.22–5.81)
RDW: 12.3 % (ref 11.5–15.5)
WBC: 10.7 K/uL — ABNORMAL HIGH (ref 4.0–10.5)
nRBC: 0 % (ref 0.0–0.2)

## 2024-03-01 LAB — BASIC METABOLIC PANEL WITH GFR
Anion gap: 6 (ref 5–15)
BUN: 10 mg/dL (ref 6–20)
CO2: 31 mmol/L (ref 22–32)
Calcium: 9.1 mg/dL (ref 8.9–10.3)
Chloride: 101 mmol/L (ref 98–111)
Creatinine, Ser: 0.86 mg/dL (ref 0.61–1.24)
GFR, Estimated: >60 mL/min (ref >60)
Glucose, Bld: 185 mg/dL — ABNORMAL HIGH (ref 70–99)
Potassium: 4.3 mmol/L (ref 3.5–5.1)
Sodium: 138 mmol/L (ref 135–145)

## 2024-03-01 LAB — GLUCOSE, CAPILLARY
Glucose-Capillary: 217 mg/dL — ABNORMAL HIGH (ref 70–99)
Glucose-Capillary: 156 mg/dL — ABNORMAL HIGH (ref 70–99)
Glucose-Capillary: 145 mg/dL — ABNORMAL HIGH (ref 70–99)
Glucose-Capillary: 169 mg/dL — ABNORMAL HIGH (ref 70–99)

## 2024-03-01 MED ORDER — ARFORMOTEROL TARTRATE 15 MCG/2ML IN NEBU
15.0000 ug | INHALATION_SOLUTION | Freq: Two times a day (BID) | RESPIRATORY_TRACT | Status: DC
  Administered 2024-03-01 – 2024-03-04 (×7): 15 ug via RESPIRATORY_TRACT
  Filled 2024-03-01 (×7): qty 2

## 2024-03-01 MED ORDER — SODIUM CHLORIDE 3 % IN NEBU
4.0000 mL | INHALATION_SOLUTION | Freq: Two times a day (BID) | RESPIRATORY_TRACT | Status: AC
Start: 2024-03-01 — End: 2024-03-04
  Administered 2024-03-01 – 2024-03-04 (×6): 4 mL via RESPIRATORY_TRACT
  Filled 2024-03-01 (×6): qty 4

## 2024-03-01 MED ORDER — BUDESONIDE 0.5 MG/2ML IN SUSP
0.5000 mg | Freq: Two times a day (BID) | RESPIRATORY_TRACT | Status: DC
  Administered 2024-03-01 – 2024-03-04 (×7): 0.5 mg via RESPIRATORY_TRACT
  Filled 2024-03-01 (×7): qty 2

## 2024-03-01 MED ORDER — IPRATROPIUM-ALBUTEROL 0.5-2.5 (3) MG/3ML IN SOLN: 3.0000 mL | Freq: Four times a day (QID) | RESPIRATORY_TRACT | Status: DC | PRN

## 2024-03-01 NOTE — Progress Notes (Addendum)
 Progress Note   Patient: Charles Weeks FMW:[id number] DOB: August 03, 1965 DOA: 02/29/2024     1 DOS: the patient was seen and examined on 03/01/2024   Brief hospital narrative : Charles Weeks is a 58 y.o. 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   No fever but reporting chills; there has not been any hemoptysis, chest pain, nausea, vomiting, abdominal pain, diarrhea, dysuria, hematuria, melena, hematochezia, focal weaknesses or any other complaints.   On arrival to the ED patient chest images demonstrating the presence of new infiltrate suggesting pneumonia along with emphysematous changes/bronchitis.  Patient met criteria for sepsis at time of admission with reported chills (questionable low-grade fever at home), elevated respiratory rate and also elevated WBCs.   Cultures has been taken, antibiotics started and TRH consulted to place patient in the hospital for further evaluation and management of COPD exacerbation and pneumonia.    Assessment and plan 1-sepsis secondary to bronchiectasis/pneumonia - Continue IV antibiotics - Patient received fluid resuscitation at the moment sepsis features improving - Follow cultures results - Continue supportive care and follow clinical response.  2-acute on chronic respiratory failure with hypoxia in the setting of COPD exacerbation and bronchiectasis/pneumonia - Continue antibiotics as mentioned above - Continue steroids, bronchodilators/nebulizer management, mucolytic's, flutter valve, and incentive spirometer. - Imodium to help with secretions chest physiotherapy, scopolamine  patch and hypertonic nebulizations will be provided - Continue  to wean off oxygen supplementation as tolerated - Follow clinical response.  3-GERD/GI prophylaxis - Continue PPI.  4-prediabetes - Following diet control as an outpatient - Continue modified carbohydrate diet - Sliding scale insulin  has been ordered - Follow CBG fluctuation.  5-chronic pain syndrome - Continue home analgesic therapy: Patient on OxyContin  and oxycodone  for breakthrough - Will also continue Neurontin .  6-BPH/irritable bladder syndrome - Continue Proscar  and bethanechol  - If there is any complaints of urinary retention will check bladder scan and pursued In-N-Out cath as needed - Currently asymptomatic and voiding appropriately.  7-history of anxiety/panic attack - Continue home anxiolytic therapy.  Subjective:  Afebrile, no chest pain, no nausea, no vomiting.  Requiring anywhere 8-10 L nasal cannula supplementation.  Patient with trach collar in place; still with increased upper respiratory secretions.  Positive expiratory wheezing on exam.  Reporting feeling better.  Physical Exam: Vitals:   03/01/24 0800 03/01/24 0859 03/01/24 1123 03/01/24 1627  BP: 115/60     Pulse: 66     Resp: 10     Temp:   98.5 F (36.9 C) 98.1 F (36.7 C)  TempSrc:   Oral Oral  SpO2: 96% 96%    Weight:      Height:       General exam: Alert, awake, oriented x 3; in no major distress.  Overall feeling better today than at time of admission; still requiring higher level of oxygen supplementation in comparison to baseline. Respiratory system: Positive rhonchi bilaterally; mild expiratory wheezing on exam.  No using accessory muscle. Cardiovascular system:RRR.  No rubs, no gallops, unable to properly assess JVD with body habitus (short neck and trach collar in place). Gastrointestinal system: Abdomen is nondistended, soft and nontender. No organomegaly or masses felt. Normal bowel sounds heard. Central nervous system: No focal neurological deficits. Extremities: No cyanosis, clubbing  or edema. Skin: No petechiae. Psychiatry: Judgement  and insight appear normal. Mood & affect appropriate.    Data Reviewed: Basic metabolic panel: Sodium 138, potassium 4.3, chloride 101, bicarb 31, BUN 10, creatinine 0.86 and GFR >60 CBC: WBCs 10.7, hemoglobin 12.0 and platelet count 180K  Family Communication: No family at bedside at time of evaluation; patient's primary nurse has been in contact with POA and has provided updates.; I was also able to update POA over the phone and answered all questions..  Disposition: Status is: Inpatient Remains inpatient appropriate because: Continue IV antibiotics and respiratory management.  Anticipating discharge back home once medically stable.  Time spent: 50 minutes  Author: Eric Nunnery, MD 03/01/2024 5:21 PM  For on call review [email or web address].

## 2024-03-01 NOTE — TOC Initial Note (Signed)
 Transition of Care [facility]) - Initial/Assessment Note    Patient Details  Name: Charles Weeks MRN: [id number] Date of Birth: [birth year]  [facility]) CM/SW Contact:    Sharlyne Stabs, RN Phone Number: 03/01/2024, 12:44 PM  Clinical Narrative:        Patient admitted with acute on chronic respiratory failure with hypoxia. Tracheostomy patient, lives with his sister, Boby.  He has PCS ( aide) 5 days a week 8 hours a day. [name] provides all his trach care. They have all DME needed in the home.       Expected Discharge Plan: Home/Self Care Barriers to Discharge: Continued Medical Work up   Patient Goals and CMS Choice Patient states their goals for this hospitalization and ongoing recovery are:: return home CMS Medicare.gov Compare Post Acute Care list provided to:: Patient Represenative (must comment) Choice offered to / list presented to : Sibling [place] ownership interest in [facility]provided to:: Sibling   Expected Discharge Plan and Services      Living arrangements for the past 2 months: Single Family Home          HH Arranged: Nurse's Aide   Prior Living Arrangements/Services Living arrangements for the past 2 months: Single Family Home Lives with:: Siblings Patient language and need for interpreter reviewed:: Yes Do you feel safe going back to the place where you live?: Yes      Need for Family Participation in Patient Care: Yes (Comment) Care giver support system in place?: Yes (comment) Current home services: DME Criminal Activity/Legal Involvement Pertinent to Current Situation/Hospitalization: No - Comment as needed  Activities of Daily Living   ADL Screening (condition at time of admission) Independently performs ADLs?: Yes (appropriate for developmental age) Is the patient deaf or have difficulty hearing?: No Does the patient have difficulty seeing, even when wearing glasses/contacts?: No Does the patient have difficulty  concentrating, remembering, or making decisions?: No      Emotional Assessment      Orientation: : Oriented to Self, Oriented to Place, Oriented to Situation Alcohol / Substance Use: Not Applicable Psych Involvement: No (comment)  Admission diagnosis:  Acute on chronic respiratory failure with hypoxia (HCC) [J96.21] Patient Active Problem List   Diagnosis Date Noted   Acute on chronic respiratory failure with hypoxia (HCC) 02/29/2024   Former smoker 03/31/2023   Type 2 diabetes mellitus with hyperglycemia (HCC) 01/13/2023   GAD (generalized anxiety disorder) 10/24/2022   Nausea without vomiting 07/10/2022   Encounter for general adult medical examination with abnormal findings 04/16/2022   Paronychia of toe of left foot due to ingrown toenail 01/07/2022   Constipation 01/02/2022   Hospital discharge follow-up 12/04/2021   Ileus (HCC)    Chronic pain 11/22/2021   GERD (gastroesophageal reflux disease) 11/22/2021   HLD (hyperlipidemia) 11/22/2021   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 [place] [address] Phone: [phone] Fax: [phone]   Social Drivers of Health (SDOH) Social History: SDOH Screenings   Food Insecurity: No Food Insecurity (02/29/2024)  Housing:  Low Risk  (02/29/2024)  Transportation Needs: No Transportation Needs (02/29/2024)  Utilities: Not At Risk (02/29/2024)  Alcohol Screen: Low Risk  (12/08/2019)  Depression (PHQ2-9): High Risk (12/22/2023)  Financial Resource Strain: Low Risk  (04/21/2023)   Physical Activity: Unknown (04/21/2023)  Social Connections: Socially Isolated (04/21/2023)  Stress: No Stress Concern Present (04/21/2023)  Tobacco Use: Medium Risk (02/29/2024)   SDOH Interventions:    Readmission Risk Interventions    03/01/2024   12:43 PM  Readmission Risk Prevention Plan  Transportation Screening Complete  PCP or Specialist Appt within 5-7 Days Not Complete  Home Care Screening Complete  Medication Review (RN CM) Complete

## 2024-03-01 NOTE — Plan of Care (Signed)
  Problem: Clinical Measurements: Goal: Respiratory complications will improve Outcome: Progressing Goal: Cardiovascular complication will be avoided Outcome: Progressing   Problem: Education: Goal: Knowledge of General Education information will improve Description: Including pain rating scale, medication(s)/side effects and non-pharmacologic comfort measures Outcome: Progressing   Problem: Activity: Goal: Risk for activity intolerance will decrease Outcome: Progressing   Problem: Nutrition: Goal: Adequate nutrition will be maintained Outcome: Progressing   Problem: Coping: Goal: Level of anxiety will decrease Outcome: Progressing

## 2024-03-02 ENCOUNTER — Inpatient Hospital Stay (HOSPITAL_COMMUNITY)

## 2024-03-02 ENCOUNTER — Encounter (INDEPENDENT_AMBULATORY_CARE_PROVIDER_SITE_OTHER): Payer: Self-pay | Admitting: Gastroenterology

## 2024-03-02 DIAGNOSIS — J9621 Acute and chronic respiratory failure with hypoxia: Secondary | ICD-10-CM | POA: Diagnosis not present

## 2024-03-02 LAB — BASIC METABOLIC PANEL WITH GFR
Anion gap: 9 (ref 5–15)
BUN: 12 mg/dL (ref 6–20)
CO2: 28 mmol/L (ref 22–32)
Calcium: 8.9 mg/dL (ref 8.9–10.3)
Chloride: 105 mmol/L (ref 98–111)
Creatinine, Ser: 0.8 mg/dL (ref 0.61–1.24)
GFR, Estimated: >60 mL/min (ref >60)
Glucose, Bld: 194 mg/dL — ABNORMAL HIGH (ref 70–99)
Potassium: 4.3 mmol/L (ref 3.5–5.1)
Sodium: 142 mmol/L (ref 135–145)

## 2024-03-02 LAB — CBC
HCT: 35.7 % — ABNORMAL LOW (ref 39.0–52.0)
Hemoglobin: 11.8 g/dL — ABNORMAL LOW (ref 13.0–17.0)
MCH: 30.1 pg (ref 26.0–34.0)
MCHC: 33.1 g/dL (ref 30.0–36.0)
MCV: 91.1 fL (ref 80.0–100.0)
Platelets: 243 K/uL (ref 150–400)
RBC: 3.92 MIL/uL — ABNORMAL LOW (ref 4.22–5.81)
RDW: 12.5 % (ref 11.5–15.5)
WBC: 15.5 K/uL — ABNORMAL HIGH (ref 4.0–10.5)
nRBC: 0 % (ref 0.0–0.2)

## 2024-03-02 LAB — GLUCOSE, CAPILLARY
Glucose-Capillary: 172 mg/dL — ABNORMAL HIGH (ref 70–99)
Glucose-Capillary: 190 mg/dL — ABNORMAL HIGH (ref 70–99)
Glucose-Capillary: 139 mg/dL — ABNORMAL HIGH (ref 70–99)
Glucose-Capillary: 175 mg/dL — ABNORMAL HIGH (ref 70–99)

## 2024-03-02 MED ORDER — IPRATROPIUM BROMIDE 0.02 % IN SOLN
0.5000 mg | Freq: Four times a day (QID) | RESPIRATORY_TRACT | Status: DC
  Administered 2024-03-02 – 2024-03-04 (×9): 0.5 mg via RESPIRATORY_TRACT
  Filled 2024-03-02 (×9): qty 2.5

## 2024-03-02 MED ORDER — LIDOCAINE 5 % EX PTCH
1.0000 | MEDICATED_PATCH | CUTANEOUS | Status: DC
  Administered 2024-03-02 – 2024-03-03 (×2): 1 via TRANSDERMAL
  Filled 2024-03-02 (×5): qty 1

## 2024-03-02 MED ORDER — LEVALBUTEROL HCL 1.25 MG/0.5ML IN NEBU: 1.2500 mg | INHALATION_SOLUTION | Freq: Four times a day (QID) | RESPIRATORY_TRACT | Status: DC

## 2024-03-02 MED ORDER — HYDROMORPHONE HCL 1 MG/ML IJ SOLN
1.0000 mg | INTRAMUSCULAR | Status: DC | PRN
  Administered 2024-03-02 – 2024-03-04 (×4): 1 mg via INTRAVENOUS
  Filled 2024-03-02 (×4): qty 1

## 2024-03-02 MED ORDER — ALBUTEROL SULFATE (2.5 MG/3ML) 0.083% IN NEBU
2.5000 mg | INHALATION_SOLUTION | Freq: Four times a day (QID) | RESPIRATORY_TRACT | Status: DC | PRN
  Administered 2024-03-04: 2.5 mg via RESPIRATORY_TRACT
  Filled 2024-03-02: qty 3

## 2024-03-02 NOTE — Progress Notes (Signed)
 Patient has been sitting up at the edge of the bed today. Patient stated that he does not want to get up to the chair today and prefers to sit at the edge of the bed. Has been ambulatory with supervision to and from [facility].

## 2024-03-02 NOTE — Progress Notes (Signed)
 Patient requesting staff to not monitor his blood pressure as often. Last BP at 1024 was 102/62 and MAP 75. BP unhooked from arm. Patient stated he will allow staff to take his BP every 4 hours if it is needed. MD has been made aware.

## 2024-03-02 NOTE — Progress Notes (Signed)
 PROGRESS NOTE    Patient: Charles Weeks                            PCP: Bevely Doffing, FNP                    DOB: May 29, 1965            DOA: 02/29/2024 FMW:[id number]             DOS: 03/02/2024, 1:10 PM   LOS: 2 days   Date of Service: The patient was seen and examined on 03/02/2024  Subjective:   The patient was seen and examined this morning, sitting in edge of the bed, no acute distress, receiving breathing treatment through his tracheostomy able to express himself.   Brief Narrative:   Charles Weeks is a 58 y.o. 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   ED patient chest images demonstrating the presence of new infiltrate suggesting pneumonia along with emphysematous changes/bronchitis.  Patient met criteria for sepsis at time of admission with reported chills (questionable low-grade fever at home), elevated respiratory rate and also elevated WBCs.   Cultures has been taken, antibiotics started and TRH consulted to place patient in the hospital for further evaluation and management of COPD exacerbation and pneumonia.    Assessment & Plan:   Principal Problem:   Acute on chronic respiratory failure with hypoxia (HCC)   Sepsis secondary to bronchiectasis/pneumonia - Improving sepsis physiology,  Still on 8 L of oxygen, 30% FiO2 via tracheostomy-satting 95%, - Continue supportive therapy including IV antibiotics - Reducing IVF infusion - Follow cultures results-  - Continue supportive care and follow clinical response.   Acute on chronic respiratory failure with hypoxia in the setting of COPD exacerbation and bronchiectasis/pneumonia -Improving O2 demand-Down from 10 to 8 L via  nasal cannula with FiO2 of 30% - Continue supportive therapy, nebs, mucolytics, IV steroids, antibiotics - Encouraging valve, and incentive spirometer. - Imodium to help with secretions chest physiotherapy, scopolamine  patch and hypertonic nebulizations will be provided - Continue to wean off oxygen supplementation as tolerated - Repeat chest x-ray reviewed today 03/02/2024: Stable subsegmental right lower lobe atelectasis.   GERD/GI prophylaxis - Continue PPI.   Prediabetes -Anticipating hyperglycemia on steroids - Following diet control as an outpatient - Continue modified carbohydrate diet - Checking CBG q. ACHS, with SSI coverage   Chronic pain syndrome - Continue home analgesic therapy: Patient on OxyContin  and oxycodone  for breakthrough - Will also continue Neurontin .   BPH/irritable bladder syndrome - Continue Proscar  and bethanechol  - If there is any complaints of urinary retention will check bladder scan and pursued In-N-Out cath as needed - Currently asymptomatic and voiding appropriately.   History of anxiety/panic attack - Continue home anxiolytic therapy.   ------------------------------------------------------------------------------------------------------------------------------------------ Nutritional status:  The patient's BMI is: Body mass index is 29.2 kg/m. I agree with the assessment and plan as outlined -------------------------------------------------------------------------------------------------------------------------------------------  DVT prophylaxis:  enoxaparin  (LOVENOX ) injection 40 mg Start: 02/29/24 2200   Code Status:   Code Status: Full Code  Family Communication: Nursing staff constantly updated his sister on the phone -Advance care planning has been discussed.   Admission status:   Status is: Inpatient Remains inpatient appropriate because:  Needing respiratory support   Disposition: From  - home             Planning for  discharge in 1-2 days   Procedures:   No admission procedures for hospital encounter.   Antimicrobials:  Anti-infectives (From admission, onward)    Start     Dose/Rate Route Frequency Ordered Stop   03/01/24 1000  azithromycin  (ZITHROMAX ) 500 mg in sodium chloride  0.9 % 250 mL IVPB        500 mg 250 mL/hr over 60 Minutes Intravenous Every 24 hours 02/29/24 1721     02/29/24 1800  ceFEPIme (MAXIPIME) 2 g in sodium chloride  0.9 % 100 mL IVPB        2 g 200 mL/hr over 30 Minutes Intravenous Every 8 hours 02/29/24 1352     02/29/24 1100  cefTRIAXone  (ROCEPHIN ) 1 g in sodium chloride  0.9 % 100 mL IVPB        1 g 200 mL/hr over 30 Minutes Intravenous  Once 02/29/24 1046 02/29/24 1145   02/29/24 1100  azithromycin  (ZITHROMAX ) 500 mg in sodium chloride  0.9 % 250 mL IVPB        500 mg 250 mL/hr over 60 Minutes Intravenous  Once 02/29/24 1046 02/29/24 1317        Medication:   arformoterol   15 mcg Nebulization BID   aspirin   81 mg Oral Daily   bethanechol   50 mg Oral BID   budesonide  (PULMICORT ) nebulizer solution  0.5 mg Nebulization BID   buPROPion   300 mg Oral Daily   busPIRone   10 mg Oral BID   Chlorhexidine  Gluconate Cloth  6 each Topical Q0600   clonazePAM   0.5 mg Oral QHS   dextromethorphan -guaiFENesin   1 tablet Oral BID   enoxaparin  (LOVENOX ) injection  40 mg Subcutaneous Q24H   finasteride   5 mg Oral Daily   insulin  aspart  0-15 Units Subcutaneous TID WC   insulin  aspart  0-5 Units Subcutaneous QHS   ipratropium  0.5 mg Nebulization Q6H   methylPREDNISolone  (SOLU-MEDROL ) injection  40 mg Intravenous Q12H   metoprolol  succinate  12.5 mg Oral Daily   montelukast   10 mg Oral QHS   oxyCODONE   15 mg Oral Q12H   pantoprazole   40 mg Oral Daily   polyethylene glycol  17 g Oral Daily   QUEtiapine   300 mg Oral QHS   And   QUEtiapine   150 mg Oral Q1200   And   QUEtiapine   150 mg Oral q1800   scopolamine   1 patch Transdermal Q72H   senna  1 tablet Oral BID   sodium  chloride HYPERTONIC  4 mL Nebulization BID    albuterol , oxyCODONE    Objective:   Vitals:   03/02/24 0600 03/02/24 0802 03/02/24 0814 03/02/24 1024  BP:   (!) 132/92 102/62  Pulse: 61  87 78  Resp: (!) 9   18  Temp:  98 F (36.7 C)    TempSrc:  Oral    SpO2: 96%   95%  Weight:      Height:        Intake/Output Summary (Last 24 hours) at 03/02/2024 1310 Last data filed at 03/02/2024 0303 Gross per 24 hour  Intake 693.17 ml  Output 1175 ml  Net -481.83 ml   Filed Weights   02/29/24 0906 02/29/24 1430  Weight: 88 kg 87.1 kg     Physical examination:   General:  AAO x 3,  cooperative, no distress;   HEENT:  Normocephalic, PERRL,  otherwise with in Normal limits   Neuro:  CNII-XII intact. , normal motor and sensation, reflexes intact   Lungs:   Clear to auscultation BL, Respirations unlabored,  No wheezes / crackles  Cardio:    S1/S2, RRR, No murmure, No Rubs or Gallops   Abdomen:  Soft, non-tender, bowel sounds active all four quadrants, no guarding or peritoneal signs.  Muscular  skeletal:  Limited exam -global generalized weaknesses - in bed, able to move all 4 extremities,   2+ pulses,  symmetric, No pitting edema  Skin:  Dry, warm to touch, negative for any Rashes,  Wounds: Please see nursing documentation       ------------------------------------------------------------------------------------------------------------------------------------------    LABs:     Latest Ref Rng & Units 03/02/2024    4:55 AM 03/01/2024    4:30 AM 02/29/2024    2:11 PM  CBC  WBC 4.0 - 10.5 K/uL 15.5  10.7  12.9   Hemoglobin 13.0 - 17.0 g/dL 88.1  87.9  86.9   Hematocrit 39.0 - 52.0 % 35.7  36.2  39.0   Platelets 150 - 400 K/uL 243  180  203       Latest Ref Rng & Units 03/02/2024    4:55 AM 03/01/2024    4:30 AM 02/29/2024    2:11 PM  CMP  Glucose 70 - 99 mg/dL 805  814    BUN 6 - 20 mg/dL 12  10    Creatinine 9.38 - 1.24 mg/dL 9.19  9.13  9.06   Sodium 135 -  145 mmol/L 142  138    Potassium 3.5 - 5.1 mmol/L 4.3  4.3    Chloride 98 - 111 mmol/L 105  101    CO2 22 - 32 mmol/L 28  31    Calcium  8.9 - 10.3 mg/dL 8.9  9.1         Micro Results Recent Results (from the past 240 hours)  Resp panel by RT-PCR (RSV, Flu A&B, Covid) Anterior Nasal Swab     Status: None   Collection Time: 02/29/24  9:07 AM   Specimen: Anterior Nasal Swab  Result Value Ref Range Status   SARS Coronavirus 2 by RT PCR NEGATIVE NEGATIVE Final    Comment: (NOTE) SARS-CoV-2 target nucleic acids are NOT DETECTED.  The SARS-CoV-2 RNA is generally detectable in upper respiratory specimens during the acute phase of infection. The lowest concentration of SARS-CoV-2 viral copies this assay can detect is 138 copies/mL. A negative result does not preclude SARS-Cov-2 infection and should not be used as the sole basis for treatment or other patient management decisions. A negative result may occur with  improper specimen collection/handling, submission of specimen other than nasopharyngeal swab, presence of viral mutation(s) within the areas targeted by this assay, and inadequate number of viral copies(<138 copies/mL). A negative result must be combined with clinical observations, patient history, and epidemiological information. The expected result is Negative.  Fact Sheet for Patients:  BloggerCourse.com  Fact Sheet for Healthcare Providers:  SeriousBroker.it  This test is no t yet approved or cleared by the [place]  FDA and  has been authorized for detection and/or diagnosis of SARS-CoV-2 by FDA under an Emergency Use Authorization (EUA). This EUA will remain  in effect (meaning this test can be used) for the duration of the COVID-19 declaration under Section 564(b)(1) of the Act, 21 U.S.C.section 360bbb-3(b)(1), unless the authorization is terminated  or revoked sooner.       Influenza A by PCR NEGATIVE  NEGATIVE Final   Influenza B by PCR NEGATIVE NEGATIVE Final    Comment: (NOTE) The Xpert Xpress SARS-CoV-2/FLU/RSV plus assay is intended as an aid in the diagnosis of influenza from Nasopharyngeal swab specimens and should not be used as a sole basis for treatment. Nasal washings and aspirates are unacceptable for Xpert Xpress SARS-CoV-2/FLU/RSV testing.  Fact Sheet for Patients: BloggerCourse.com  Fact Sheet for Healthcare Providers: SeriousBroker.it  This test is not yet approved or cleared by the [place]  FDA and has been authorized for detection and/or diagnosis of SARS-CoV-2 by FDA under an Emergency Use Authorization (EUA). This EUA will remain in effect (meaning this test can be used) for the duration of the COVID-19 declaration under Section 564(b)(1) of the Act, 21 U.S.C. section 360bbb-3(b)(1), unless the authorization is terminated or revoked.     Resp Syncytial Virus by PCR NEGATIVE NEGATIVE Final    Comment: (NOTE) Fact Sheet for Patients: BloggerCourse.com  Fact Sheet for Healthcare Providers: SeriousBroker.it  This test is not yet approved or cleared by the [place]  FDA and has been authorized for detection and/or diagnosis of SARS-CoV-2 by FDA under an Emergency Use Authorization (EUA). This EUA will remain in effect (meaning this test can be used) for the duration of the COVID-19 declaration under Section 564(b)(1) of the Act, 21 U.S.C. section 360bbb-3(b)(1), unless the authorization is terminated or revoked.  Performed at [facility], [address], [place], [place] [address]   Culture, blood (routine x 2)     Status: None (Preliminary result)   Collection Time: 02/29/24  9:36 AM   Specimen: Right Antecubital; Blood  Result Value Ref Range Status   Specimen Description   Final    RIGHT ANTECUBITAL BOTTLES DRAWN AEROBIC AND ANAEROBIC   Special  Requests Blood Culture adequate volume  Final   Culture   Final    NO GROWTH 2 DAYS Performed at [facility], [address], [place], [place] [address]    Report Status PENDING  Incomplete  Culture, blood (routine x 2)     Status: None (Preliminary result)   Collection Time: 02/29/24  9:43 AM   Specimen: Left Antecubital; Blood  Result Value Ref Range Status   Specimen Description   Final    LEFT ANTECUBITAL BOTTLES DRAWN AEROBIC AND ANAEROBIC   Special Requests Blood Culture adequate volume  Final   Culture   Final    NO GROWTH 2 DAYS Performed at [facility], [address], [place], [place] [address]    Report Status PENDING  Incomplete  MRSA Next Gen by PCR, Nasal     Status: None   Collection Time: 02/29/24  2:30 PM   Specimen: Nasal Mucosa; Nasal Swab  Result Value Ref Range Status   MRSA by PCR Next Gen NOT DETECTED NOT DETECTED Final    Comment: (NOTE) The GeneXpert MRSA Assay (FDA approved for NASAL specimens only), is one component of a comprehensive MRSA colonization surveillance program. It is not intended to diagnose MRSA infection nor to guide or monitor treatment for MRSA infections. Test performance is not FDA approved in patients less than 42 years old. Performed at [facility], [address] [place], [place] [address]     Radiology Reports DG Chest 2 View Result Date: 03/02/2024 EXAM: 2 VIEW(S) XRAY OF THE CHEST 03/02/2024 10:50:00 AM COMPARISON: Comparison film from 2 hours prior. CLINICAL HISTORY: SOB (shortness of breath) [id number]. Pt having SOB. Hx of asthma, COPD, CAD, hypertension, pneumonia. Former smoker. FINDINGS: LINES, TUBES  AND DEVICES: Stable tracheostomy. LUNGS AND PLEURA: Stable subsegmental atelectasis or opacity in right lower lobe. No pleural effusion. HEART AND MEDIASTINUM: No acute abnormality of the cardiac and mediastinal silhouettes. BONES AND SOFT TISSUES: Stable lower thoracic compression deformity. IMPRESSION: 1. Stable subsegmental  right lower lobe atelectasis. Electronically signed by: Oneil Devonshire MD 03/02/2024 11:36 AM EDT RP Workstation: [id number]   DG CHEST PORT 1 VIEW Result Date: 03/02/2024 EXAM: 1 VIEW(S) XRAY OF THE CHEST 03/02/2024 08:57:56 AM COMPARISON: 02/29/2024 CLINICAL HISTORY: SOB (shortness of breath) [id number] FINDINGS: LINES, TUBES AND DEVICES: Tracheostomy in place. LUNGS AND PLEURA: Bandlike atelectasis/scarring of right lung base. Chronic coarsened interstitial markings. HEART AND MEDIASTINUM: No acute abnormality of the cardiac and mediastinal silhouettes. BONES AND SOFT TISSUES: No acute osseous abnormality. IMPRESSION: 1. Mild right basilar atelectasis Electronically signed by: Oneil Devonshire MD 03/02/2024 11:35 AM EDT RP Workstation: [id number]    SIGNED: Adriana DELENA Grams, MD, FHM. FAAFP. [facility] - [facility] Critical care time spent - 55 min.  In seeing, evaluating and examining the patient. Reviewing medical records, labs, drawn plan of care. [facility],  Pager (please use [email or web address] to page/ text) Please use Epic Secure Chat for non-urgent communication (7AM-7PM)  If 7PM-7AM, please contact night-coverage [email or web address], 03/02/2024, 1:10 PM

## 2024-03-02 NOTE — Progress Notes (Addendum)
 Radiology at bedside to complete DG chest 2 view. Patient sister on the phone with patient at this time. Patient sister requested that chest x-ray be done downstairs. Technicians and this RN educated patient's daughter and patient that the exact same image can be done at bedside. Patient stated that he is agreeable for this to be done at bedside. Patient is alert and oriented x4. Sister stated that she will speak with staff on this when she arrives. Chest x-ray completed at bedside per patient request.

## 2024-03-02 NOTE — Progress Notes (Signed)
 Patient requesting for blood pressure taking to be every 4 hours and cuff be unhook from his arm. Blood pressure been stable for the night. MD informed.

## 2024-03-02 NOTE — Plan of Care (Signed)

## 2024-03-02 NOTE — Hospital Course (Addendum)
 Charles Weeks is a 58 y.o. 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   ED patient chest images demonstrating the presence of new infiltrate suggesting pneumonia along with emphysematous changes/bronchitis.  Patient met criteria for sepsis at time of admission with reported chills (questionable low-grade fever at home), elevated respiratory rate and also elevated WBCs.   Cultures has been taken, antibiotics started and TRH consulted to place patient in the hospital for further evaluation and management of COPD exacerbation and pneumonia.    Assessment & Plan:   Principal Problem:   Acute on chronic respiratory failure with hypoxia (HCC)   Sepsis secondary to bronchiectasis/pneumonia - Improving sepsis physiology,  Still on 8 L of oxygen, 30% FiO2 via tracheostomy-satting 95%, - Continue supportive therapy including IV antibiotics - Reducing IVF infusion - Follow cultures results-  - Continue supportive care and follow clinical response.   Acute on chronic respiratory failure with hypoxia in the setting of COPD exacerbation and bronchiectasis/pneumonia -Improving O2 demand-Down from 10 to 8 L via nasal cannula with FiO2 of 30% - Continue supportive therapy, nebs, mucolytics, IV steroids, antibiotics - Encouraging valve, and incentive spirometer. - Imodium to help with secretions chest physiotherapy, scopolamine  patch and hypertonic nebulizations will be provided - Continue to wean off oxygen supplementation as tolerated - Repeat chest x-ray reviewed today 03/02/2024: Stable subsegmental right lower lobe atelectasis.   GERD/GI prophylaxis - Continue PPI.   Prediabetes -Anticipating hyperglycemia  on steroids - Following diet control as an outpatient - Continue modified carbohydrate diet - Checking CBG q. ACHS, with SSI coverage   Chronic pain syndrome - Continue home analgesic therapy: Patient on OxyContin  and oxycodone  for breakthrough - Will also continue Neurontin .   BPH/irritable bladder syndrome - Continue Proscar  and bethanechol  - If there is any complaints of urinary retention will check bladder scan and pursued In-N-Out cath as needed - Currently asymptomatic and voiding appropriately.   History of anxiety/panic attack - Continue home anxiolytic therapy.

## 2024-03-03 ENCOUNTER — Other Ambulatory Visit: Payer: Self-pay

## 2024-03-03 ENCOUNTER — Other Ambulatory Visit (HOSPITAL_BASED_OUTPATIENT_CLINIC_OR_DEPARTMENT_OTHER): Payer: Self-pay

## 2024-03-03 ENCOUNTER — Other Ambulatory Visit (HOSPITAL_COMMUNITY): Payer: Self-pay

## 2024-03-03 DIAGNOSIS — J9621 Acute and chronic respiratory failure with hypoxia: Secondary | ICD-10-CM | POA: Diagnosis not present

## 2024-03-03 LAB — GLUCOSE, CAPILLARY
Glucose-Capillary: 185 mg/dL — ABNORMAL HIGH (ref 70–99)
Glucose-Capillary: 193 mg/dL — ABNORMAL HIGH (ref 70–99)
Glucose-Capillary: 116 mg/dL — ABNORMAL HIGH (ref 70–99)
Glucose-Capillary: 141 mg/dL — ABNORMAL HIGH (ref 70–99)

## 2024-03-03 MED ORDER — LEVOFLOXACIN 750 MG PO TABS
750.0000 mg | ORAL_TABLET | Freq: Every day | ORAL | Status: DC
  Administered 2024-03-04: 750 mg via ORAL
  Filled 2024-03-03: qty 1

## 2024-03-03 MED ORDER — SODIUM CHLORIDE 3 % IN NEBU
4.0000 mL | INHALATION_SOLUTION | Freq: Two times a day (BID) | RESPIRATORY_TRACT | 5 refills | Status: AC
  Filled 2024-03-03 (×2): qty 750, 90d supply, fill #0

## 2024-03-03 MED ORDER — PREDNISONE 20 MG PO TABS
50.0000 mg | ORAL_TABLET | Freq: Every day | ORAL | Status: DC
  Administered 2024-03-04: 50 mg via ORAL
  Filled 2024-03-03: qty 1

## 2024-03-03 MED ORDER — LEVOFLOXACIN 750 MG PO TABS
750.0000 mg | ORAL_TABLET | Freq: Every day | ORAL | 0 refills | Status: AC
  Filled 2024-03-03 (×3): qty 4, 4d supply, fill #0

## 2024-03-03 MED ORDER — METHYLPREDNISOLONE 4 MG PO TBPK
ORAL_TABLET | ORAL | 0 refills | Status: DC
  Filled 2024-03-03 (×3): qty 21, 6d supply, fill #0

## 2024-03-03 MED ORDER — DM-GUAIFENESIN ER 30-600 MG PO TB12
1.0000 | ORAL_TABLET | Freq: Two times a day (BID) | ORAL | 0 refills | Status: AC
  Filled 2024-03-03: qty 20, 10d supply, fill #0
  Filled 2024-03-03: qty 10, 5d supply, fill #0

## 2024-03-03 NOTE — Progress Notes (Signed)
 PROGRESS NOTE    Patient: Charles Weeks                            PCP: Bevely Doffing, FNP                    DOB: 07-Oct-1965            DOA: 02/29/2024 FMW:[id number]             DOS: 03/03/2024, 1:13 PM   LOS: 3 days   Date of Service: The patient was seen and examined on 03/03/2024  Subjective:   The patient was seen and examined this morning, awake alert and oriented, sitting at the side of bed, able to communicate Improved shortness of breath, On 8 L of oxygen with FiO2 of 30% via trach satting 96%   Brief Narrative:   Kartik Fernando is a 58 y.o. 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   ED patient chest images demonstrating the presence of new infiltrate suggesting pneumonia along with emphysematous changes/bronchitis.  Patient met criteria for sepsis at time of admission with reported chills (questionable low-grade fever at home), elevated respiratory rate and also elevated WBCs.   Cultures has been taken, antibiotics started and TRH consulted to place patient in the hospital for further evaluation and management of COPD exacerbation and pneumonia.    Assessment & Plan:   Principal Problem:   Acute on chronic respiratory failure with hypoxia (HCC)   Sepsis secondary to bronchiectasis/pneumonia - Improving sepsis physiology,  - Hemodynamically stable, currently satting 96% on 8 L, with FiO2 of 30%  -Per sister at home on 3 L of oxygen  - Continue supportive therapy including IV antibiotics - Continue IVF - Cultures-no growth to date - Continue supportive care and follow clinical response.   Acute on chronic respiratory failure with hypoxia in the setting of COPD  exacerbation and bronchiectasis/pneumonia -Improving O2 demand-Down from 10 to 8 L via nasal cannula with FiO2 of 30% currently satting 96% - Continue supportive therapy, nebs, mucolytics, IV steroids, antibiotics >> patient to p.o. steroids, p.o. antibiotics in a.m.  - Encouraging valve, and incentive spirometer. - Imodium to help with secretions chest physiotherapy, scopolamine  patch and hypertonic nebulizations will be provided - Continue to wean off oxygen supplementation as tolerated - Repeat chest x-ray reviewed today 03/02/2024: Stable subsegmental right lower lobe atelectasis.   GERD/GI prophylaxis - Continue PPI.   Prediabetes -Anticipating hyperglycemia on steroids - Following diet control as an outpatient - Continue modified carbohydrate diet - Checking CBG q. ACHS, with SSI coverage   Chronic pain syndrome - Continue home analgesic therapy: Patient on OxyContin  and oxycodone  for breakthrough - Will also continue Neurontin .    BPH/irritable bladder syndrome - Continue Proscar  and bethanechol  - If there is any complaints of urinary retention will check bladder scan and pursued In-N-Out cath as needed - Currently asymptomatic and voiding appropriately.   History of anxiety/panic attack - Continue home anxiolytic therapy.   Polypharmacy: Extensive discussion with POA sister who is reluctant to any changes now. Multiple psych meds, multiple overlapping medications for BPH although discussed with sister in detail We expressed that we do not agree with current home regimen-he is to  follow-up with his PCP, psych physician, neurologist for modification of current medications    ------------------------------------------------------------------------------------------------------------------------------------------ Nutritional status:  The patient's BMI is: Body mass index is 29.2 kg/m. I agree with the assessment and plan as outlined  -------------------------------------------------------------------------------------------------------------------------------------------  DVT prophylaxis:  enoxaparin  (LOVENOX ) injection 40 mg Start: 02/29/24 2200   Code Status:   Code Status: Full Code  Family Communication: Nursing staff constantly updated his sister on the phone -Advance care planning has been discussed.   Admission status:   Status is: Inpatient Remains inpatient appropriate because: Needing respiratory support   Disposition: From  - home             Planning for discharge in 1 days   Procedures:   No admission procedures for hospital encounter.   Antimicrobials:  Anti-infectives (From admission, onward)    Start     Dose/Rate Route Frequency Ordered Stop   03/04/24 1000  levofloxacin  (LEVAQUIN ) tablet 750 mg        750 mg Oral Daily 03/03/24 1004     03/04/24 0000  levofloxacin  (LEVAQUIN ) 750 MG tablet        750 mg Oral Daily 03/03/24 1308 03/08/24 2359   03/01/24 1000  azithromycin  (ZITHROMAX ) 500 mg in sodium chloride  0.9 % 250 mL IVPB  Status:  Discontinued        500 mg 250 mL/hr over 60 Minutes Intravenous Every 24 hours 02/29/24 1721 03/03/24 1016   02/29/24 1800  ceFEPIme (MAXIPIME) 2 g in sodium chloride  0.9 % 100 mL IVPB  Status:  Discontinued        2 g 200 mL/hr over 30 Minutes Intravenous Every 8 hours 02/29/24 1352 03/03/24 1004   02/29/24 1100  cefTRIAXone  (ROCEPHIN ) 1 g in sodium chloride  0.9 % 100 mL IVPB        1 g 200 mL/hr over 30 Minutes Intravenous  Once 02/29/24 1046 02/29/24 1145   02/29/24 1100  azithromycin  (ZITHROMAX ) 500 mg in sodium chloride  0.9 % 250 mL IVPB        500 mg 250 mL/hr over 60 Minutes Intravenous  Once 02/29/24 1046 02/29/24 1317        Medication:   arformoterol   15 mcg Nebulization BID   aspirin   81 mg Oral Daily   bethanechol   50 mg Oral BID   budesonide  (PULMICORT ) nebulizer solution  0.5 mg Nebulization BID   buPROPion   300 mg Oral Daily    busPIRone   10 mg Oral BID   Chlorhexidine  Gluconate Cloth  6 each Topical Q0600   clonazePAM   0.5 mg Oral QHS   dextromethorphan -guaiFENesin   1 tablet Oral BID   enoxaparin  (LOVENOX ) injection  40 mg Subcutaneous Q24H   finasteride   5 mg Oral Daily   insulin  aspart  0-15 Units Subcutaneous TID WC   insulin  aspart  0-5 Units Subcutaneous QHS   ipratropium  0.5 mg Nebulization Q6H   [START ON 03/04/2024] levofloxacin   750 mg Oral Daily   lidocaine   1 patch Transdermal Q24H   metoprolol  succinate  12.5 mg Oral Daily   montelukast   10 mg Oral QHS   oxyCODONE   15 mg Oral Q12H   pantoprazole   40 mg Oral Daily   polyethylene glycol  17 g Oral Daily   [START ON 03/04/2024] predniSONE   50 mg Oral Q breakfast   QUEtiapine   300 mg Oral QHS   And   QUEtiapine   150 mg Oral Q1200   And   QUEtiapine   150 mg Oral q1800  scopolamine   1 patch Transdermal Q72H   senna  1 tablet Oral BID   sodium chloride  HYPERTONIC  4 mL Nebulization BID    albuterol , HYDROmorphone  (DILAUDID ) injection, oxyCODONE    Objective:   Vitals:   03/03/24 0554 03/03/24 0759 03/03/24 0800 03/03/24 0900  BP: (!) 155/96 (!) 145/87 (!) 145/87 133/79  Pulse: (!) 58 77 81 87  Resp: 12  20 20   Temp:   (!) 97.4 F (36.3 C)   TempSrc:   Oral   SpO2: 95%  91% 96%  Weight:      Height:        Intake/Output Summary (Last 24 hours) at 03/03/2024 1313 Last data filed at 03/03/2024 1058 Gross per 24 hour  Intake 550 ml  Output 400 ml  Net 150 ml   Filed Weights   02/29/24 0906 02/29/24 1430  Weight: 88 kg 87.1 kg     Physical examination:       General:  AAO x 3,  cooperative, no distress;   HEENT:  Tracheostomy present-functional call Normocephalic, PERRL, otherwise with in Normal limits   Neuro:  CNII-XII intact. , normal motor and sensation, reflexes intact   Lungs:   Clear to auscultation BL, Respirations unlabored,  No wheezes / crackles  Cardio:    S1/S2, RRR, No murmure, No Rubs or Gallops    Abdomen:  Soft, non-tender, bowel sounds active all four quadrants, no guarding or peritoneal signs.  Muscular  skeletal:  Limited exam -global generalized weaknesses - in bed, able to move all 4 extremities,   2+ pulses,  symmetric, No pitting edema  Skin:  Dry, warm to touch, negative for any Rashes,  Wounds: Please see nursing documentation           ------------------------------------------------------------------------------------------------------------------------------------------    LABs:     Latest Ref Rng & Units 03/02/2024    4:55 AM 03/01/2024    4:30 AM 02/29/2024    2:11 PM  CBC  WBC 4.0 - 10.5 K/uL 15.5  10.7  12.9   Hemoglobin 13.0 - 17.0 g/dL 88.1  87.9  86.9   Hematocrit 39.0 - 52.0 % 35.7  36.2  39.0   Platelets 150 - 400 K/uL 243  180  203       Latest Ref Rng & Units 03/02/2024    4:55 AM 03/01/2024    4:30 AM 02/29/2024    2:11 PM  CMP  Glucose 70 - 99 mg/dL 805  814    BUN 6 - 20 mg/dL 12  10    Creatinine 9.38 - 1.24 mg/dL 9.19  9.13  9.06   Sodium 135 - 145 mmol/L 142  138    Potassium 3.5 - 5.1 mmol/L 4.3  4.3    Chloride 98 - 111 mmol/L 105  101    CO2 22 - 32 mmol/L 28  31    Calcium  8.9 - 10.3 mg/dL 8.9  9.1         Micro Results Recent Results (from the past 240 hours)  Resp panel by RT-PCR (RSV, Flu A&B, Covid) Anterior Nasal Swab     Status: None   Collection Time: 02/29/24  9:07 AM   Specimen: Anterior Nasal Swab  Result Value Ref Range Status   SARS Coronavirus 2 by RT PCR NEGATIVE NEGATIVE Final    Comment: (NOTE) SARS-CoV-2 target nucleic acids are NOT DETECTED.  The SARS-CoV-2 RNA is generally detectable in upper respiratory specimens during the acute phase of infection. The lowest concentration  of SARS-CoV-2 viral copies this assay can detect is 138 copies/mL. A negative result does not preclude SARS-Cov-2 infection and should not be used as the sole basis for treatment or other patient management decisions. A  negative result may occur with  improper specimen collection/handling, submission of specimen other than nasopharyngeal swab, presence of viral mutation(s) within the areas targeted by this assay, and inadequate number of viral copies(<138 copies/mL). A negative result must be combined with clinical observations, patient history, and epidemiological information. The expected result is Negative.  Fact Sheet for Patients:  BloggerCourse.com  Fact Sheet for Healthcare Providers:  SeriousBroker.it  This test is no t yet approved or cleared by the [place]  FDA and  has been authorized for detection and/or diagnosis of SARS-CoV-2 by FDA under an Emergency Use Authorization (EUA). This EUA will remain  in effect (meaning this test can be used) for the duration of the COVID-19 declaration under Section 564(b)(1) of the Act, 21 U.S.C.section 360bbb-3(b)(1), unless the authorization is terminated  or revoked sooner.       Influenza A by PCR NEGATIVE NEGATIVE Final   Influenza B by PCR NEGATIVE NEGATIVE Final    Comment: (NOTE) The Xpert Xpress SARS-CoV-2/FLU/RSV plus assay is intended as an aid in the diagnosis of influenza from Nasopharyngeal swab specimens and should not be used as a sole basis for treatment. Nasal washings and aspirates are unacceptable for Xpert Xpress SARS-CoV-2/FLU/RSV testing.  Fact Sheet for Patients: BloggerCourse.com  Fact Sheet for Healthcare Providers: SeriousBroker.it  This test is not yet approved or cleared by the [place]  FDA and has been authorized for detection and/or diagnosis of SARS-CoV-2 by FDA under an Emergency Use Authorization (EUA). This EUA will remain in effect (meaning this test can be used) for the duration of the COVID-19 declaration under Section 564(b)(1) of the Act, 21 U.S.C. section 360bbb-3(b)(1), unless the authorization  is terminated or revoked.     Resp Syncytial Virus by PCR NEGATIVE NEGATIVE Final    Comment: (NOTE) Fact Sheet for Patients: BloggerCourse.com  Fact Sheet for Healthcare Providers: SeriousBroker.it  This test is not yet approved or cleared by the [place]  FDA and has been authorized for detection and/or diagnosis of SARS-CoV-2 by FDA under an Emergency Use Authorization (EUA). This EUA will remain in effect (meaning this test can be used) for the duration of the COVID-19 declaration under Section 564(b)(1) of the Act, 21 U.S.C. section 360bbb-3(b)(1), unless the authorization is terminated or revoked.  Performed at [facility], [address] [place], [place] [address]   Culture, blood (routine x 2)     Status: None (Preliminary result)   Collection Time: 02/29/24  9:36 AM   Specimen: Right Antecubital; Blood  Result Value Ref Range Status   Specimen Description   Final    RIGHT ANTECUBITAL BOTTLES DRAWN AEROBIC AND ANAEROBIC   Special Requests Blood Culture adequate volume  Final   Culture   Final    NO GROWTH 3 DAYS Performed at [facility], [address] [place], [place] [address]    Report Status PENDING  Incomplete  Culture, blood (routine x 2)     Status: None (Preliminary result)   Collection Time: 02/29/24  9:43 AM   Specimen: Left Antecubital; Blood  Result Value Ref Range Status   Specimen Description   Final    LEFT ANTECUBITAL BOTTLES DRAWN AEROBIC AND ANAEROBIC   Special Requests Blood Culture adequate volume  Final   Culture   Final  NO GROWTH 3 DAYS Performed at [facility], [address], [place], [place] [address]    Report Status PENDING  Incomplete  MRSA Next Gen by PCR, Nasal     Status: None   Collection Time: 02/29/24  2:30 PM   Specimen: Nasal Mucosa; Nasal Swab  Result Value Ref Range Status   MRSA by PCR Next Gen NOT DETECTED NOT DETECTED Final    Comment: (NOTE) The GeneXpert  MRSA Assay (FDA approved for NASAL specimens only), is one component of a comprehensive MRSA colonization surveillance program. It is not intended to diagnose MRSA infection nor to guide or monitor treatment for MRSA infections. Test performance is not FDA approved in patients less than 81 years old. Performed at [facility], [address] [place], [place] [address]     Radiology Reports No results found.   SIGNED: Adriana DELENA Grams, MD, FHM. FAAFP. [facility]ol[facility] - [facility] Critical care time spent - 55 min.  In seeing, evaluating and examining the patient. Reviewing medical records, labs, drawn plan of care. [facility],  Pager (please use [email or web address] to page/ text) Please use Epic Secure Chat for non-urgent communication (7AM-7PM)  If 7PM-7AM, please contact night-coverage [email or web address], 03/03/2024, 1:13 PM

## 2024-03-03 NOTE — Plan of Care (Signed)

## 2024-03-03 NOTE — Plan of Care (Signed)
  Problem: Clinical Measurements: Goal: Respiratory complications will improve Outcome: Progressing Goal: Cardiovascular complication will be avoided Outcome: Progressing   Problem: Nutrition: Goal: Adequate nutrition will be maintained Outcome: Progressing   Problem: Coping: Goal: Level of anxiety will decrease Outcome: Progressing   Problem: Health Behavior/Discharge Planning: Goal: Ability to manage health-related needs will improve Outcome: Progressing   Problem: Education: Goal: Knowledge of General Education information will improve Description: Including pain rating scale, medication(s)/side effects and non-pharmacologic comfort measures Outcome: Progressing

## 2024-03-04 ENCOUNTER — Other Ambulatory Visit (HOSPITAL_BASED_OUTPATIENT_CLINIC_OR_DEPARTMENT_OTHER): Payer: Self-pay

## 2024-03-04 ENCOUNTER — Other Ambulatory Visit (HOSPITAL_COMMUNITY): Payer: Self-pay

## 2024-03-04 ENCOUNTER — Other Ambulatory Visit: Payer: Self-pay

## 2024-03-04 DIAGNOSIS — J9621 Acute and chronic respiratory failure with hypoxia: Secondary | ICD-10-CM | POA: Diagnosis not present

## 2024-03-04 LAB — CBC WITH DIFFERENTIAL/PLATELET
Abs Immature Granulocytes: 0.16 K/uL — ABNORMAL HIGH (ref 0.00–0.07)
Basophils Absolute: 0 K/uL (ref 0.0–0.1)
Basophils Relative: 0 %
Eosinophils Absolute: 0.1 K/uL (ref 0.0–0.5)
Eosinophils Relative: 1 %
HCT: 40.6 % (ref 39.0–52.0)
Hemoglobin: 13.2 g/dL (ref 13.0–17.0)
Immature Granulocytes: 2 %
Lymphocytes Relative: 32 %
Lymphs Abs: 3.5 K/uL (ref 0.7–4.0)
MCH: 30.7 pg (ref 26.0–34.0)
MCHC: 32.5 g/dL (ref 30.0–36.0)
MCV: 94.4 fL (ref 80.0–100.0)
Monocytes Absolute: 1.1 K/uL — ABNORMAL HIGH (ref 0.1–1.0)
Monocytes Relative: 10 %
Neutro Abs: 6.2 K/uL (ref 1.7–7.7)
Neutrophils Relative %: 55 %
Platelets: 243 K/uL (ref 150–400)
RBC: 4.3 MIL/uL (ref 4.22–5.81)
RDW: 12.7 % (ref 11.5–15.5)
WBC: 11 K/uL — ABNORMAL HIGH (ref 4.0–10.5)
nRBC: 0 % (ref 0.0–0.2)

## 2024-03-04 LAB — COMPREHENSIVE METABOLIC PANEL WITH GFR
ALT: 30 U/L (ref 0–44)
AST: 25 U/L (ref 15–41)
Albumin: 3.6 g/dL (ref 3.5–5.0)
Alkaline Phosphatase: 94 U/L (ref 38–126)
Anion gap: 7 (ref 5–15)
BUN: 18 mg/dL (ref 6–20)
CO2: 30 mmol/L (ref 22–32)
Calcium: 8.7 mg/dL — ABNORMAL LOW (ref 8.9–10.3)
Chloride: 104 mmol/L (ref 98–111)
Creatinine, Ser: 0.83 mg/dL (ref 0.61–1.24)
GFR, Estimated: >60 mL/min (ref >60)
Glucose, Bld: 123 mg/dL — ABNORMAL HIGH (ref 70–99)
Potassium: 4 mmol/L (ref 3.5–5.1)
Sodium: 141 mmol/L (ref 135–145)
Total Bilirubin: 0.2 mg/dL (ref 0.0–1.2)
Total Protein: 5.8 g/dL — ABNORMAL LOW (ref 6.5–8.1)

## 2024-03-04 LAB — GLUCOSE, CAPILLARY
Glucose-Capillary: 120 mg/dL — ABNORMAL HIGH (ref 70–99)
Glucose-Capillary: 110 mg/dL — ABNORMAL HIGH (ref 70–99)

## 2024-03-04 MED ORDER — SCOPOLAMINE 1 MG/3DAYS TD PT72
1.0000 | MEDICATED_PATCH | TRANSDERMAL | 2 refills | Status: DC
  Filled 2024-03-04: qty 10, 30d supply, fill #0

## 2024-03-04 NOTE — Discharge Summary (Signed)
 Physician Discharge Summary   Patient: Charles Weeks MRN: [id number] DOB: [birth year]  Admit date:     02/29/2024  Discharge date: 03/04/24  Discharge Physician: Adriana DELENA Grams   PCP: Bevely Doffing, FNP   Recommendations at discharge:   -Follow-up with PCP in 1 week - Follow-up with urologist in 1-2 weeks - Follow-up with the pulmonologist in 1 week - Polypharmacy-multiple overlapping medication noted for home use.  Strongly recommending following up with above specialist to modify current medications including psych meds, urology medications, and chronic pain medication adjustment ASAP   Discharge Diagnoses: Principal Problem:    Sepsis secondary to pneumonia, bronchiectasis   Acute on chronic respiratory failure with hypoxia [facility])    Hospital Course: Ardian Haberland is a 58 y.o. 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   ED patient chest images demonstrating the presence of new infiltrate suggesting pneumonia along with emphysematous changes/bronchitis.  Patient met criteria for sepsis at time of admission with reported chills (questionable low-grade fever at home), elevated respiratory rate and also elevated WBCs.   Cultures has been taken, antibiotics started and TRH consulted to place patient in the hospital for further evaluation and management of COPD exacerbation and pneumonia.    Assessment & Plan:   Principal Problem:   Acute on chronic respiratory failure with hypoxia (HCC)   Sepsis secondary to bronchiectasis/pneumonia - Improving sepsis physiology,  Remained hemodynamically stable satting 92% on FiO2 of 30%, 8 L - Cultures-no growth to date - Continue supportive care  and follow clinical response.   Acute on chronic respiratory failure with hypoxia in the setting of COPD exacerbation and bronchiectasis/pneumonia -Improving O2 demand-Down from 10 to 8 L via nasal cannula with FiO2 of 30% currently satting 96% - Continue supportive therapy, nebs, mucolytics, IV steroids, antibiotics >> patient to p.o. steroids, p.o. antibiotics   - Encouraging valve, and incentive spirometer.  - Repeat chest x-ray reviewed today 03/02/2024: Stable subsegmental right lower lobe atelectasis.   GERD/GI prophylaxis - Continue PPI.   Prediabetes -Anticipating hyperglycemia on steroids - Following diet control as an outpatient - Continue modified carbohydrate diet    Chronic pain syndrome - Continue home analgesic therapy: Patient on OxyContin  and oxycodone  for breakthrough - Will also continue Neurontin .    BPH/irritable bladder syndrome - Continue Proscar  and bethanechol  - If there is any complaints of urinary retention will check bladder scan and pursued In-N-Out cath as needed - Currently asymptomatic and voiding appropriately.   History of anxiety/panic attack - Continue home anxiolytic therapy.   Polypharmacy: Extensive discussion with POA sister who is reluctant to any changes now. Multiple psych meds, multiple overlapping medications for BPH although discussed with sister in detail We expressed that we do not agree with current home regimen-he is to follow-up with his PCP, psych physician, neurologist for modification of current medications   Assessment and Plan: No notes have been filed under this hospital service. Service: Hospitalist       Disposition: Home Diet recommendation:  Discharge Diet Orders (From admission, onward)     Start     Ordered   03/04/24 0000  Diet - low sodium heart healthy        03/04/24 1002           Cardiac and  Carb modified diet DISCHARGE MEDICATION: Allergies as of 03/04/2024       Reactions    Ciprofloxacin  Hives        Medication List     STOP taking these medications    bethanechol  25 MG tablet Commonly known as: URECHOLINE    budesonide  0.25 MG/2ML nebulizer solution Commonly known as: PULMICORT        TAKE these medications    acetaminophen  325 MG tablet Commonly known as: TYLENOL  Take 2 tablets (650 mg total) by mouth every 6 (six) hours as needed for mild pain or moderate pain.   albuterol  (2.5 MG/3ML) 0.083% nebulizer solution Commonly known as: PROVENTIL  Inhale 1 vial (3 mLs) into the lungs via nebulizer every 6 (six) hours as needed for wheezing or shortness of breath   Ventolin  HFA 108 (90 Base) MCG/ACT inhaler Generic drug: albuterol  Inhale 1-2 puffs into the lungs every 4 (four) hours as needed FOR WHEEZING OR SHORTNESS OF BREATH   aspirin  81 MG chewable tablet Chew 1 tablet (81 mg total) by mouth daily.   buPROPion  300 MG 24 hr tablet Commonly known as: WELLBUTRIN  XL Take 1 tablet (300 mg total) by mouth daily.   busPIRone  10 MG tablet Commonly known as: BUSPAR  Take 1 tablet (10 mg total) by mouth 2 (two) times daily.   clonazePAM  0.5 MG tablet Commonly known as: KLONOPIN  Take 1 tablet (0.5 mg total) by mouth at bedtime.   dextromethorphan -guaiFENesin  30-600 MG 12hr tablet Commonly known as: MUCINEX  DM Take 1 tablet by mouth 2 (two) times daily for 5 days.   finasteride  5 MG tablet Commonly known as: PROSCAR  Take 1 tablet (5 mg total) by mouth daily.   FOLATE PO Take 1 tablet by mouth every evening.   formoterol  20 MCG/2ML nebulizer solution Commonly known as: PERFOROMIST  Substituted for: Perforomist  Solution Inhale one vial in nebulizer twice a day.   levofloxacin  750 MG tablet Commonly known as: LEVAQUIN  Take 1 tablet (750 mg total) by mouth daily for 4 days.   methylPREDNISolone  4 MG Tbpk tablet Commonly known as: MEDROL  DOSEPAK Medrol  Dosepak take as instructed   metoCLOPramide  5 MG tablet Commonly known as:  Reglan  Take 1 tablet (5 mg total) by mouth every 6 (six) hours as needed for nausea.   metoprolol  succinate 25 MG 24 hr tablet Commonly known as: TOPROL -XL Take 0.5 tablets (12.5 mg total) by mouth daily.   montelukast  10 MG tablet Commonly known as: SINGULAIR  Take 1 tablet (10 mg total) by mouth at bedtime.   nitroGLYCERIN  0.4 MG SL tablet Commonly known as: NITROSTAT  Place 1 tablet (0.4 mg total) under the tongue every 5 (five) minutes as needed for chest pain.   nystatin ointment Commonly known as: MYCOSTATIN APPLY AROUND TRACH SITE TWICE DAILY FOR 2 WEEKS. MIX WITH TRIAMCINOLONE .   ondansetron  4 MG tablet Commonly known as: ZOFRAN  Take 1 tablet (4 mg total) by mouth every 8 (eight) hours as needed for nausea or vomiting.   oxyCODONE  5 MG immediate release tablet Commonly known as: Roxicodone  Take 1 tablet (5 mg total) by mouth every 4 (four) hours as needed for severe pain (pain score 7-10). What changed: when to take this   OxyCONTIN  15 MG 12 hr tablet Generic drug: oxyCODONE  Take 1 tablet (15 mg total) by mouth every 12 (twelve) hours. Do Not fill before 02/17/2024 What changed: Another medication with the same name was changed. Make sure you understand how and when to take each.   OXYGEN Inhale 3 L into the  lungs continuous.   pantoprazole  40 MG tablet Commonly known as: PROTONIX  Take 1 tablet (40 mg total) by mouth daily.   polyethylene glycol powder 17 GM/SCOOP powder Commonly known as: GoodSense ClearLax Take 17 g by mouth 2 (two) to 3 (three) times daily as needed.   QUEtiapine  300 MG tablet Commonly known as: SEROQUEL  Take 0.5 tablets (150 mg total) by mouth 2 (two) times daily AND 1 tablet (300 mg total) at bedtime.   rosuvastatin  10 MG tablet Commonly known as: Crestor  Take 1 tablet (10 mg total) by mouth daily.   scopolamine  1 MG/3DAYS Commonly known as: TRANSDERM-SCOP Place 1 patch (1 mg total) onto the skin every 3 (three) days. Start taking on:  March 06, 2024   senna 8.6 MG tablet Commonly known as: SENOKOT Take 1 tablet by mouth 2 (two) times daily.   sodium chloride  HYPERTONIC 3 % nebulizer solution Take 4 mLs by nebulization 2 (two) times daily.   tamsulosin  0.4 MG Caps capsule Commonly known as: FLOMAX  Take 1 capsule (0.4 mg total) by mouth 2 (two) times daily.   terazosin  5 MG capsule Commonly known as: HYTRIN  Take 1 capsule (5 mg total) by mouth at bedtime.   Barista For transporting to and from Doctors appointments   triamcinolone  ointment 0.1 % Commonly known as: KENALOG  APPLY AROUND TRACH SITE TWICE DAILY FOR 2 WEEKS. MIX WITH NYSTATIN.   UNABLE TO FIND 1 each by Does not apply route daily. Med Name: LIFT CHAIR   UNABLE TO FIND 1 each by Does not apply route daily. Med Name: BATHROOM RENOVATION FOR WALK IN SHOWER   Underpads Extra Large Misc 1 Pad by Does not apply route in the morning and at bedtime.   Vitamin D3 125 MCG (5000 UT) Caps Take 5,000 Units by mouth daily.        Discharge Exam: Filed Weights   02/29/24 0906 02/29/24 1430  Weight: 88 kg 87.1 kg        General:  AAO x 3,  cooperative, no distress;   HEENT:  Tracheostomy is in place, functional, reduced secretion Normocephalic, PERRL, otherwise with in Normal limits   Neuro:  CNII-XII intact. , normal motor and sensation, reflexes intact   Lungs:   Clear to auscultation BL, Respirations unlabored,  No wheezes / crackles  Cardio:    S1/S2, RRR, No murmure, No Rubs or Gallops   Abdomen:  Soft, non-tender, bowel sounds active all four quadrants, no guarding or peritoneal signs.  Muscular  skeletal:  Limited exam -global generalized weaknesses - in bed, able to move all 4 extremities,   2+ pulses,  symmetric, No pitting edema  Skin:  Dry, warm to touch, negative for any Rashes,  Wounds: Please see nursing documentation          Condition at discharge: good  The results of significant diagnostics from this  hospitalization (including imaging, microbiology, ancillary and laboratory) are listed below for reference.   Imaging Studies: DG Chest 2 View Result Date: 03/02/2024 EXAM: 2 VIEW(S) XRAY OF THE CHEST 03/02/2024 10:50:00 AM COMPARISON: Comparison film from 2 hours prior. CLINICAL HISTORY: SOB (shortness of breath) [id number]. Pt having SOB. Hx of asthma, COPD, CAD, hypertension, pneumonia. Former smoker. FINDINGS: LINES, TUBES AND DEVICES: Stable tracheostomy. LUNGS AND PLEURA: Stable subsegmental atelectasis or opacity in right lower lobe. No pleural effusion. HEART AND MEDIASTINUM: No acute abnormality of the cardiac and mediastinal silhouettes. BONES AND SOFT TISSUES: Stable lower thoracic compression deformity. IMPRESSION: 1. Stable  subsegmental right lower lobe atelectasis. Electronically signed by: Oneil Devonshire MD 03/02/2024 11:36 AM EDT RP Workstation: [id number]   DG CHEST PORT 1 VIEW Result Date: 03/02/2024 EXAM: 1 VIEW(S) XRAY OF THE CHEST 03/02/2024 08:57:56 AM COMPARISON: 02/29/2024 CLINICAL HISTORY: SOB (shortness of breath) 858119 FINDINGS: LINES, TUBES AND DEVICES: Tracheostomy in place. LUNGS AND PLEURA: Bandlike atelectasis/scarring of right lung base. Chronic coarsened interstitial markings. HEART AND MEDIASTINUM: No acute abnormality of the cardiac and mediastinal silhouettes. BONES AND SOFT TISSUES: No acute osseous abnormality. IMPRESSION: 1. Mild right basilar atelectasis Electronically signed by: Oneil Devonshire MD 03/02/2024 11:35 AM EDT RP Workstation: [id number]   CT Chest W Contrast Result Date: 02/29/2024 CLINICAL DATA:  Congestion, hypoxia EXAM: CT CHEST WITH CONTRAST TECHNIQUE: Multidetector CT imaging of the chest was performed during intravenous contrast administration. RADIATION DOSE REDUCTION: This exam was performed according to the departmental dose-optimization program which includes automated exposure control, adjustment of the mA and/or kV according to patient size and/or  use of iterative reconstruction technique. CONTRAST:  80mL OMNIPAQUE  IOHEXOL  300 MG/ML  SOLN COMPARISON:  09/15/2023 FINDINGS: Cardiovascular: Aortic and left anterior descending coronary and right coronary atheromatous vascular calcifications. Mediastinum/Nodes: Unremarkable Lungs/Pleura: Tracheostomy tube noted. Emphysema. Old granulomatous disease. Along a band of scarring in the right lower lobe there is a new 2.5 by 2.2 cm nodular component on image 95 series 3. Possibilities include rounded atelectasis or malignancy forming along a scar. Consider PET-CT for differentiation. Bilateral airway thickening most striking in the lower lobes were there is substantial airway plugging. Upper Abdomen: Unremarkable Musculoskeletal: Nonunited left lateral tenth rib fracture noted. Remote compression fractures at T8-9 with interbody fusion at T8-9 and kyphotic angulation, unchanged from previous. IMPRESSION: 1. New 2.5 by 2.2 cm nodular component along a band of scarring in the right lower lobe. Possibilities include rounded atelectasis or malignancy forming along a scar. PET-CT is recommended for differentiation. 2. Bilateral airway thickening most striking in the lower lobes were there is substantial airway plugging. 3. Nonunited left lateral tenth rib fracture. 4. Remote compression fractures at T8-9 with interbody fusion at T8-9 and kyphotic angulation, unchanged from previous. 5. Aortic Atherosclerosis (ICD10-I70.0) and Emphysema (ICD10-J43.9). Electronically Signed   By: Ryan Salvage M.D.   On: 02/29/2024 12:42   DG Chest Port 1 View Result Date: 02/29/2024 CLINICAL DATA:  Shortness of breath. EXAM: PORTABLE CHEST 1 VIEW COMPARISON:  12/24/2022 FINDINGS: Left lung clear. Right base atelectasis or scarring evident. Focal consolidative opacity identified in the medial right base. Cardiopericardial silhouette is at upper limits of normal for size. No acute bony abnormality. Tracheostomy tube evident. IMPRESSION:  Focal consolidative opacity in the medial right base. Pneumonia not excluded. No mass lesion at this location on lung cancer screening chest CT 09/15/2023, but follow-up recommended to ensure resolution. Electronically Signed   By: Camellia Candle M.D.   On: 02/29/2024 09:22    Microbiology: Results for orders placed or performed during the hospital encounter of 02/29/24  Resp panel by RT-PCR (RSV, Flu A&B, Covid) Anterior Nasal Swab     Status: None   Collection Time: 02/29/24  9:07 AM   Specimen: Anterior Nasal Swab  Result Value Ref Range Status   SARS Coronavirus 2 by RT PCR NEGATIVE NEGATIVE Final    Comment: (NOTE) SARS-CoV-2 target nucleic acids are NOT DETECTED.  The SARS-CoV-2 RNA is generally detectable in upper respiratory specimens during the acute phase of infection. The lowest concentration of SARS-CoV-2 viral copies this assay can detect is 138 copies/mL.  A negative result does not preclude SARS-Cov-2 infection and should not be used as the sole basis for treatment or other patient management decisions. A negative result may occur with  improper specimen collection/handling, submission of specimen other than nasopharyngeal swab, presence of viral mutation(s) within the areas targeted by this assay, and inadequate number of viral copies(<138 copies/mL). A negative result must be combined with clinical observations, patient history, and epidemiological information. The expected result is Negative.  Fact Sheet for Patients:  BloggerCourse.com  Fact Sheet for Healthcare Providers:  SeriousBroker.it  This test is no t yet approved or cleared by the [place]  FDA and  has been authorized for detection and/or diagnosis of SARS-CoV-2 by FDA under an Emergency Use Authorization (EUA). This EUA will remain  in effect (meaning this test can be used) for the duration of the COVID-19 declaration under Section 564(b)(1) of the  Act, 21 U.S.C.section 360bbb-3(b)(1), unless the authorization is terminated  or revoked sooner.       Influenza A by PCR NEGATIVE NEGATIVE Final   Influenza B by PCR NEGATIVE NEGATIVE Final    Comment: (NOTE) The Xpert Xpress SARS-CoV-2/FLU/RSV plus assay is intended as an aid in the diagnosis of influenza from Nasopharyngeal swab specimens and should not be used as a sole basis for treatment. Nasal washings and aspirates are unacceptable for Xpert Xpress SARS-CoV-2/FLU/RSV testing.  Fact Sheet for Patients: BloggerCourse.com  Fact Sheet for Healthcare Providers: SeriousBroker.it  This test is not yet approved or cleared by the [place]  FDA and has been authorized for detection and/or diagnosis of SARS-CoV-2 by FDA under an Emergency Use Authorization (EUA). This EUA will remain in effect (meaning this test can be used) for the duration of the COVID-19 declaration under Section 564(b)(1) of the Act, 21 U.S.C. section 360bbb-3(b)(1), unless the authorization is terminated or revoked.     Resp Syncytial Virus by PCR NEGATIVE NEGATIVE Final    Comment: (NOTE) Fact Sheet for Patients: BloggerCourse.com  Fact Sheet for Healthcare Providers: SeriousBroker.it  This test is not yet approved or cleared by the [place]  FDA and has been authorized for detection and/or diagnosis of SARS-CoV-2 by FDA under an Emergency Use Authorization (EUA). This EUA will remain in effect (meaning this test can be used) for the duration of the COVID-19 declaration under Section 564(b)(1) of the Act, 21 U.S.C. section 360bbb-3(b)(1), unless the authorization is terminated or revoked.  Performed at [facility], [address], [place], [place] [address]   Culture, blood (routine x 2)     Status: None (Preliminary result)   Collection Time: 02/29/24  9:36 AM   Specimen: Right Antecubital;  Blood  Result Value Ref Range Status   Specimen Description   Final    RIGHT ANTECUBITAL BOTTLES DRAWN AEROBIC AND ANAEROBIC   Special Requests Blood Culture adequate volume  Final   Culture   Final    NO GROWTH 4 DAYS Performed at [facility], [address] [place], [place] [address]    Report Status PENDING  Incomplete  Culture, blood (routine x 2)     Status: None (Preliminary result)   Collection Time: 02/29/24  9:43 AM   Specimen: Left Antecubital; Blood  Result Value Ref Range Status   Specimen Description   Final    LEFT ANTECUBITAL BOTTLES DRAWN AEROBIC AND ANAEROBIC   Special Requests Blood Culture adequate volume  Final   Culture   Final    NO GROWTH 4 DAYS Performed at [facility], [address], [place], [place] [address]    Report Status PENDING  Incomplete  MRSA Next Gen by PCR, Nasal     Status: None   Collection Time: 02/29/24  2:30 PM   Specimen: Nasal Mucosa; Nasal Swab  Result Value Ref Range Status   MRSA by PCR Next Gen NOT DETECTED NOT DETECTED Final    Comment: (NOTE) The GeneXpert MRSA Assay (FDA approved for NASAL specimens only), is one component of a comprehensive MRSA colonization surveillance program. It is not intended to diagnose MRSA infection nor to guide or monitor treatment for MRSA infections. Test performance is not FDA approved in patients less than 64 years old. Performed at [facility], [address] [place], [place] [address]     Labs: CBC: Recent Labs  Lab 02/29/24 [phone] 02/29/24 1411 03/01/24 0430 03/02/24 0455 03/04/24 0756  WBC 13.7* 12.9* 10.7* 15.5* 11.0*  NEUTROABS 10.9*  --   --   --  6.2  HGB 14.1 13.0 12.0* 11.8* 13.2  HCT 42.2 39.0 36.2* 35.7* 40.6  MCV 90.2 91.8 91.2 91.1 94.4  PLT 212 203 180 243 243   Basic Metabolic Panel: Recent Labs  Lab 02/29/24 0936 02/29/24 1411 03/01/24 0430 03/02/24 0455 03/04/24 0756  NA 141  --  138 142 141  K 4.0  --  4.3 4.3 4.0  CL 99  --  101 105 104  CO2 32  --   31 28 30   GLUCOSE 164*  --  185* 194* 123*  BUN 8  --  10 12 18   CREATININE 0.99 0.93 0.86 0.80 0.83  CALCIUM  9.6  --  9.1 8.9 8.7*  MG  --  2.1  --   --   --   PHOS  --  3.3  --   --   --    Liver Function Tests: Recent Labs  Lab 03/04/24 0756  AST 25  ALT 30  ALKPHOS 94  BILITOT 0.2  PROT 5.8*  ALBUMIN  3.6   CBG: Recent Labs  Lab 03/03/24 0725 03/03/24 1130 03/03/24 1613 03/03/24 2208 03/04/24 0801  GLUCAP 185* 193* 116* 141* 120*    Discharge time spent: greater than 30 minutes.  Signed: Adriana DELENA Grams, MD [facility] 03/04/2024

## 2024-03-05 LAB — CULTURE, BLOOD (ROUTINE X 2)
Culture: NO GROWTH
Culture: NO GROWTH
Special Requests: ADEQUATE
Special Requests: ADEQUATE

## 2024-03-07 ENCOUNTER — Other Ambulatory Visit: Payer: Self-pay

## 2024-03-07 ENCOUNTER — Telehealth (HOSPITAL_COMMUNITY): Payer: Self-pay

## 2024-03-07 ENCOUNTER — Other Ambulatory Visit (HOSPITAL_COMMUNITY): Payer: Self-pay

## 2024-03-07 ENCOUNTER — Telehealth: Payer: Self-pay

## 2024-03-07 NOTE — Telephone Encounter (Signed)
 Pharmacy Patient Advocate Encounter   Received notification from Patient Pharmacy that prior authorization for Oxycodone  5 mg tablets is required/requested.   Insurance verification completed.   The patient is insured through [facility] MEDICAID.   Per test claim: PA required; PA submitted to above mentioned insurance via Latent Key/confirmation #/EOC [id number] Status is pending *submitted to [employer] via Latent

## 2024-03-07 NOTE — Transitions of Care (Post Inpatient/ED Visit) (Addendum)
   03/07/2024  Name: Charles Weeks MRN: [id number] DOB: Oct 06, 1965  Today's TOC FU Call Status: Today's TOC FU Call Status:: Successful TOC FU Call Completed TOC FU Call Complete Date: 03/07/24 Patient's Name and Date of Birth confirmed.  Transition Care Management Follow-up Telephone Call Date of Discharge: 03/04/24 [facility] Type of Discharge: Inpatient Admission Primary Inpatient Discharge Diagnosis:: Acute on chronic respiratory failure with hypoxia How have you been since you were released from the hospital?: Better Any questions or concerns?: No  Items Reviewed: Did you receive and understand the discharge instructions provided?: Yes Medications obtained,verified, and reconciled?: Partial Review Completed Reason for Partial Mediation Review: Sister, Charles Weeks states, she does his medication boxes for morning lunch and dinner, he has his new medications Any new allergies since your discharge?: No Do you have support at home?: Yes (Tracheostomy patient, lives with his sister, Charles Weeks.  He has PCS ( aide) 5 days a week 8 hours a day. Charles provides all his trach care. They have all DME needed in the home.) People in Home [RPT]: sibling(s) Name of Support/Comfort Primary Source: Charles Weeks  Medications Reviewed Today:  Reviewed with sister, Charles new medications received and started - declined further review Medications Reviewed Today   Medications were not reviewed in this encounter     Follow up appointments reviewed: PCP Follow-up appointment confirmed?: No (Spoke with sister, Charles Weeks who declines assistance with appointments. I need to get my calendar and I will call and figure out the appointment. I do all of his health care needs.) Specialist Hospital Follow-up appointment confirmed?: Yes Date of Specialist follow-up appointment?: 03/29/24 Follow-Up Specialty Provider:: Dr. Ozell America Do you need transportation to your follow-up appointment?:  No Do you understand care options if your condition(s) worsen?: Yes-patient verbalized understanding  Discussed and offered 30 day TOC program with sister Charles Weeks, [place].  Patient's sister declines.  The patient has been provided with contact information for the care management team and has been advised to call with any health -related questions or concerns.  The patient verbalized understanding with current plan of care.  Also, did not desire Complex Care Management follow up as well.  Charles Fish, RN, BSN, CCM [facility], [facility] RN Care Manager Direct Dial: [phone]

## 2024-03-08 ENCOUNTER — Other Ambulatory Visit: Payer: Self-pay

## 2024-03-08 ENCOUNTER — Telehealth: Payer: Self-pay | Admitting: *Deleted

## 2024-03-08 ENCOUNTER — Other Ambulatory Visit (HOSPITAL_COMMUNITY): Payer: Self-pay

## 2024-03-08 NOTE — Telephone Encounter (Signed)
 Prior auth submitted to [employer]. Prior auth approval for oxycodone  5 mg #120/ 30 days approved through 09/03/2024 [id number]

## 2024-03-11 ENCOUNTER — Ambulatory Visit: Payer: Self-pay

## 2024-03-15 ENCOUNTER — Telehealth: Payer: Self-pay | Admitting: Registered Nurse

## 2024-03-15 ENCOUNTER — Other Ambulatory Visit: Payer: Self-pay | Admitting: Registered Nurse

## 2024-03-15 ENCOUNTER — Other Ambulatory Visit (HOSPITAL_COMMUNITY): Payer: Self-pay

## 2024-03-15 MED ORDER — CLONAZEPAM 0.5 MG PO TABS
0.5000 mg | ORAL_TABLET | Freq: Every day | ORAL | 2 refills | Status: DC
  Filled 2024-03-15 – 2024-03-17 (×2): qty 30, 30d supply, fill #0
  Filled 2024-04-27: qty 30, 30d supply, fill #1
  Filled 2024-06-01 – 2024-06-03 (×4): qty 30, 30d supply, fill #2

## 2024-03-15 NOTE — Telephone Encounter (Signed)
PMP was Reviewed.  Clonazepam e-scribed to pharmacy.

## 2024-03-16 ENCOUNTER — Other Ambulatory Visit: Payer: Self-pay

## 2024-03-16 ENCOUNTER — Other Ambulatory Visit (HOSPITAL_COMMUNITY): Payer: Self-pay

## 2024-03-16 ENCOUNTER — Other Ambulatory Visit (INDEPENDENT_AMBULATORY_CARE_PROVIDER_SITE_OTHER): Payer: Self-pay | Admitting: Gastroenterology

## 2024-03-16 DIAGNOSIS — K5903 Drug induced constipation: Secondary | ICD-10-CM

## 2024-03-16 MED ORDER — POLYETHYLENE GLYCOL 3350 17 GM/SCOOP PO POWD
17.0000 g | Freq: Two times a day (BID) | ORAL | 5 refills | Status: AC
  Filled 2024-03-16: qty 952, 28d supply, fill #0
  Filled 2024-04-15 – 2024-04-19 (×2): qty 952, 28d supply, fill #1
  Filled 2024-05-13: qty 952, 28d supply, fill #2
  Filled 2024-06-09: qty 952, 28d supply, fill #3

## 2024-03-17 ENCOUNTER — Telehealth (HOSPITAL_COMMUNITY): Payer: Self-pay

## 2024-03-17 ENCOUNTER — Other Ambulatory Visit (HOSPITAL_COMMUNITY): Payer: Self-pay

## 2024-03-17 ENCOUNTER — Other Ambulatory Visit: Payer: Self-pay

## 2024-03-17 NOTE — Telephone Encounter (Signed)
 Prior shara is not needed at this time, needed a DUR code. I have submitted it to be filled.

## 2024-03-21 ENCOUNTER — Other Ambulatory Visit: Payer: Self-pay

## 2024-03-21 ENCOUNTER — Other Ambulatory Visit (HOSPITAL_COMMUNITY): Payer: Self-pay

## 2024-03-21 MED ORDER — ALBUTEROL SULFATE HFA 108 (90 BASE) MCG/ACT IN AERS
2.0000 | INHALATION_SPRAY | Freq: Four times a day (QID) | RESPIRATORY_TRACT | 5 refills | Status: DC | PRN
  Filled 2024-03-21: qty 6.7, 25d supply, fill #0
  Filled 2024-04-11 (×2): qty 18, 25d supply, fill #1
  Filled 2024-04-11: qty 6.7, 25d supply, fill #1
  Filled 2024-04-13: qty 20.1, 75d supply, fill #1

## 2024-03-21 MED ORDER — ALBUTEROL SULFATE (2.5 MG/3ML) 0.083% IN NEBU
3.0000 mL | INHALATION_SOLUTION | Freq: Four times a day (QID) | RESPIRATORY_TRACT | 5 refills | Status: AC | PRN
  Filled 2024-03-21: qty 150, 13d supply, fill #0

## 2024-03-22 ENCOUNTER — Other Ambulatory Visit: Payer: Self-pay | Admitting: Registered Nurse

## 2024-03-22 ENCOUNTER — Telehealth: Payer: Self-pay | Admitting: Registered Nurse

## 2024-03-22 ENCOUNTER — Other Ambulatory Visit (HOSPITAL_COMMUNITY): Payer: Self-pay

## 2024-03-22 DIAGNOSIS — G894 Chronic pain syndrome: Secondary | ICD-10-CM

## 2024-03-22 DIAGNOSIS — G8929 Other chronic pain: Secondary | ICD-10-CM

## 2024-03-22 NOTE — Telephone Encounter (Signed)
 PA request has been Submitted. New Encounter has been or will be created for follow up. For additional info see Pharmacy Prior Auth telephone encounter from 03/07/24.

## 2024-03-22 NOTE — Telephone Encounter (Signed)
 Pt wife called and said her husband med is running out today and he needs a refill asap. OxyContin  15 mg

## 2024-03-23 ENCOUNTER — Telehealth: Payer: Self-pay | Admitting: Registered Nurse

## 2024-03-23 ENCOUNTER — Other Ambulatory Visit (HOSPITAL_BASED_OUTPATIENT_CLINIC_OR_DEPARTMENT_OTHER): Payer: Self-pay

## 2024-03-23 ENCOUNTER — Telehealth (HOSPITAL_COMMUNITY): Payer: Self-pay | Admitting: Pharmacy Technician

## 2024-03-23 ENCOUNTER — Other Ambulatory Visit (HOSPITAL_COMMUNITY): Payer: Self-pay

## 2024-03-23 ENCOUNTER — Telehealth (HOSPITAL_COMMUNITY): Payer: Self-pay

## 2024-03-23 ENCOUNTER — Other Ambulatory Visit: Payer: Self-pay

## 2024-03-23 ENCOUNTER — Other Ambulatory Visit: Payer: Self-pay | Admitting: Registered Nurse

## 2024-03-23 DIAGNOSIS — G894 Chronic pain syndrome: Secondary | ICD-10-CM

## 2024-03-23 DIAGNOSIS — G8929 Other chronic pain: Secondary | ICD-10-CM

## 2024-03-23 MED ORDER — OXYCODONE HCL 5 MG PO TABS
5.0000 mg | ORAL_TABLET | Freq: Four times a day (QID) | ORAL | 0 refills | Status: DC
  Filled 2024-03-23 – 2024-04-03 (×2): qty 120, 30d supply, fill #0

## 2024-03-23 MED ORDER — OXYCODONE HCL ER 15 MG PO T12A
15.0000 mg | EXTENDED_RELEASE_TABLET | Freq: Two times a day (BID) | ORAL | 0 refills | Status: DC
  Filled 2024-03-23 – 2024-04-18 (×2): qty 60, 30d supply, fill #0
  Filled ????-??-??: fill #0

## 2024-03-23 MED ORDER — OXYCODONE HCL ER 15 MG PO T12A
15.0000 mg | EXTENDED_RELEASE_TABLET | Freq: Two times a day (BID) | ORAL | 0 refills | Status: AC
  Filled 2024-03-23: qty 60, 30d supply, fill #0

## 2024-03-23 NOTE — Telephone Encounter (Signed)
 PMP was Reviewed.  Oxycontin  e-scribed to pharmacy.

## 2024-03-23 NOTE — Telephone Encounter (Signed)
 PMP was Reviewed.  Oxycontin  e-scribed to pharmacy.  [name] his sister is aware via My-Chart

## 2024-03-23 NOTE — Telephone Encounter (Signed)
 Pharmacy Patient Advocate Encounter   Received notification from Pt Calls Messages that prior authorization for Oxycontin  ER 15 mg tablets is required/requested.   Insurance verification completed.   The patient is insured through [facility] MEDICAID.   Per test claim: PA required; PA submitted to above mentioned insurance via [facility] Tracks Key/confirmation #/[id number] W Status is pending

## 2024-03-23 NOTE — Telephone Encounter (Signed)
 PA request has been Received. New Encounter has been or will be created for follow up. For additional info see Pharmacy Prior Auth telephone encounter from 03/23/24.

## 2024-03-23 NOTE — Telephone Encounter (Signed)
 PMP was Reviewed.  Oxycodone  e-scribed to pharmacy.  [name] Mr. rhone ozaki understanding.

## 2024-03-24 ENCOUNTER — Other Ambulatory Visit (HOSPITAL_COMMUNITY): Payer: Self-pay

## 2024-03-25 ENCOUNTER — Other Ambulatory Visit (HOSPITAL_COMMUNITY): Payer: Self-pay

## 2024-03-28 ENCOUNTER — Other Ambulatory Visit: Payer: Self-pay | Admitting: Physical Medicine & Rehabilitation

## 2024-03-28 ENCOUNTER — Other Ambulatory Visit (HOSPITAL_COMMUNITY): Payer: Self-pay

## 2024-03-28 ENCOUNTER — Other Ambulatory Visit: Payer: Self-pay

## 2024-03-28 MED ORDER — QUETIAPINE FUMARATE 300 MG PO TABS
ORAL_TABLET | ORAL | 5 refills | Status: AC
  Filled 2024-03-28: qty 120, 60d supply, fill #0
  Filled 2024-05-25: qty 120, 60d supply, fill #1

## 2024-03-29 ENCOUNTER — Encounter: Payer: Self-pay | Admitting: Internal Medicine

## 2024-03-29 ENCOUNTER — Ambulatory Visit: Admitting: Internal Medicine

## 2024-03-29 VITALS — BP 114/76 | HR 57 | Ht 68.0 in | Wt 193.4 lb

## 2024-03-29 DIAGNOSIS — R911 Solitary pulmonary nodule: Secondary | ICD-10-CM | POA: Insufficient documentation

## 2024-03-29 DIAGNOSIS — J9611 Chronic respiratory failure with hypoxia: Secondary | ICD-10-CM | POA: Diagnosis not present

## 2024-03-29 DIAGNOSIS — J449 Chronic obstructive pulmonary disease, unspecified: Secondary | ICD-10-CM

## 2024-03-29 NOTE — Assessment & Plan Note (Addendum)
 See CT chest  02/29/24 vs 09/15/23  along a band of scarring in the right lower lobe there is a new 2.5 by 2.2 cm nodular component  - PET ordered 03/29/2024   Unlikely to be bronchogrenic ca growing this fast but this is radiology's rec so reasonable to close the loop this way if insurance will cover it.   Discussed in detail all the  indications, usual  risks and alternatives  relative to the benefits with patient who agrees to proceed with w/u as outlined.

## 2024-03-29 NOTE — Assessment & Plan Note (Addendum)
 Quit smoking 08/2020/ MM - 01/24/2020  After extensive coaching inhaler device,  effectiveness =    75% (short Ti) > change symb 80 to 160 2bid  -  PFT's   05/01/20   FEV1 0.97 (27 % ) ratio 0.30  p 1 % improvement from saba p symbicort  prior to study with DLCO  11.59 (43%) corrects to 2.0 (45%)  for alv volume and FV curve severe airflow obst with 35% improvement in FVC p saba  - 05/31/2020  After extensive coaching inhaler device,  effectiveness =    Short Ti, prostate problems so rx symbicort  160 2bid and prn saba -  05/31/2020   Walked RA  approx   300 ft  @ moderate pace  stopped due to  Sob at end but sats still 94%    - Labs ordered 12/24/2020  :    alpha one AT phenotype  MM level 154  -  started on bud/ performist 09/2023  -  Chest LDSCT 09/15/23 Moderate to severe emphysema       Group E in terms of symptoms/risk so  laba/lama/ICS  therefore appropriate rx at this point  >>>  laba/ICS at present and next can add Yupelri if covered but he is so sedentary at this point not clear he would benefit so hold off for now and see how motivated he is to start regular sub maximal ex program  Re SABA :  I spent extra time with pt today reviewing appropriate use of albuterol  for prn use on exertion with the following points: 1) saba is for relief of sob that does not improve by walking a slower pace or resting but rather if the pt does not improve after trying this first. 2) If the pt is convinced, as many are, that saba helps recover from activity faster then it's easy to tell if this is the case by re-challenging : ie stop, take the inhaler, then p 5 minutes try the exact same activity (intensity of workload) that just caused the symptoms and see if they are substantially diminished or not after saba 3) if there is an activity that reproducibly causes the symptoms, try the saba 15 min before the activity on alternate days   If in fact the saba really does help, then fine to continue to use it prn but advised  may need to look closer at the maintenance regimen (ie before / after yupelri addeded) being used to achieve better control of airways disease with exertion.

## 2024-03-29 NOTE — Assessment & Plan Note (Addendum)
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 Patient wanted to stop walk after 134ft due to SOB and being unsteady with lowest sats 94%  - 03/31/2023   Walked on 2lpm cont  x  2  lap(s) =  approx 300  ft  @ mod pace, stopped due to fatigue > sob  with lowest 02 sats 90%    - 03/29/2024   Walked on RA  x  1  lap(s) =  approx 150  ft  @ slow to mod pace, stopped due to sob with desats to 87%  then placed on 4lpm cont  but could not continue the walk with sats still around 87%   >>> Rec continue sub max ex with 02 adjust to sats > 90% (will need at least 4lpm for sustained walking )

## 2024-03-29 NOTE — Patient Instructions (Addendum)
 My office will be contacting you by phone for referral for PET scan at [facility]  - if you don't hear back from my office within one week please call us  back or notify us  thru MyChart and we'll address it right away.   Make sure you check your oxygen saturation  AT  your highest level of activity (not after you stop)   to be sure it stays over 90% and adjust  02 flow upward to maintain this level if needed but remember to turn it back to previous settings when you stop (to conserve your supply).   Remember to try the shopping cart in the parking lot  for balance and to place your 02 into to make it easier to get out and get some exercise.    Ok to try albuterol  15 min before an activity (on alternating days)  that you know would usually make you short of breath and see if it makes any difference and if makes none then don't take albuterol  after activity unless you can't catch your breath as this means it's the resting that helps, not the albuterol .       Please schedule a follow up visit in  6 months but call sooner if needed

## 2024-03-29 NOTE — Progress Notes (Signed)
 Charles Weeks, male    DOB: 03/18/1966,    MRN: [id number]   Brief patient profile:  58 yowm illiterate though says finished 7th grade with asthma since in his 53s  MM/quit smoking 08/2020 on disability since last PFT's ? 2017/18 relocated to [facility] summer 2021 and referred to pulmonary clinic in [place]  01/24/2020 by Marty Barefoot NP.  Admit date: 11/04/2019 Discharge date: 11/10/2019   Admitted From: Home Disposition: Home       Home Health: Equipment/Devices: Home oxygen at 3 L, nebulizer machine   Discharge Condition: Stable CODE STATUS: Full code Diet recommendation: Heart healthy   Brief/Interim Summary: Charles Weeks is a 58 y.o. male with a history of CAD, MI, COPD.  Patient just recently moved to [place]  from [place] .  He has no medications and has not seen a physician here.  He started having shortness of breath about 3 weeks ago which has been increasing.  He has been having cough with some purulent sputum production.  He had a relative give him some steroids over the past few days, without too much improvement.  No other palliating or provoking factors.  Denies fevers, chills, nausea, vomiting.   Emergency Department Course: Patient started on BiPAP and gradually weaned to nasal cannula.  Initial saturations were in the 80s.  Improved with steroids and BiPAP.  Remained stable on nasal cannula.   Discharge Diagnoses:  Principal Problem:   Acute respiratory failure with hypoxia (HCC) Active Problems:   Coronary artery disease   COPD with acute exacerbation (HCC)   Acute on chronic hypoxic respiratory failure secondary to acute COPD exacerbation Presented with hypoxia with O2 saturation in the 80s initially requiring NIV then transitioned to 3-4 L  -previously on 3L at home but has not used since Oct 2020 -Continue brovana  and pulmicort  -Continue hypertonic saline nebs -continue duonebs -He was treated with IV steroids which have since been  transitioned to prednisone  taper -Personally reviewed chest x-ray which showed no lobular pulmonary infiltrates to suggest pneumonia -Maintain O2 saturation greater than 88 to 90% -TOC consulted to assist with PCP and pulmonology referrals -Currently, he can ambulate comfortably on 3 L of oxygen which appears to be near his baseline   -Acute COPD exacerbation Ongoing tobacco use; tobacco cessation counseling at bedside. COVID-19 negative Treated with IV steroids which was subsequently transitioned to prednisone  taper Continue management as stated above -Continue Symbicort  on discharge -Continue albuterol  nebs as needed, provided with nebulizer machine   Hyperglycemia/impaired glucose tolerance Presented with elevated serum glucose Hemoglobin A1c 6.3 on 619 1221 Monitored on sliding scale insulin    Coronary artery disease status post PCI/history of MI Denies any anginal symptoms at the time of this exam Continue Brilinta   Not on statin, obtain lipid panel   Tobacco use disorder Tobacco cessation counseling Nicotine  patch   BPH Continue terazosin  Monitor urine output    History of Present Illness  01/24/2020  Pulmonary/ 1st office eval/ Cass Vandermeulen / [place] Office  Chief Complaint  Patient presents with   Pulmonary Consult    Referred by Chiquita Barefoot, NP.  Pt states hx of COPD. Pt c/o increased SOB since Winter 2020. He sometimes gets winded walking room to room at home. He is using albuterol  inhaler and neb both 1-2 x per day.   Dyspnea:  [employer] walking / walk from [facility] space (sisters) / no steps but there are times he can't get across the room s sob  Cough: better since quit  Sleep:  flat bed/ 1 pillow  - no resp  SABA use:neb  once or twice daily can't make it thru the day and also using 2 different forms of hfa  02  Prn  rec Plan A = Automatic = Always=    Symbicort  160 Take 2 puffs first thing in am and then another 2 puffs about 12 hours later.  Work on inhaler  technique: Plan B = Backup (to supplement plan A, not to replace it) Only use your albuterol  inhaler as a rescue medication Plan C = Crisis (instead of Plan B but only if Plan B stops working) - only use your albuterol  nebulizer if you first try Plan B and it fails to help  Make sure you check your oxygen saturations at highest level of activity  Please schedule a follow up visit in 3 months with pfts on return      05/31/2020  f/u ov/[place] office/Luverna Degenhart re: needs to go to lab / did stop smoking Chief Complaint  Patient presents with   Follow-up    Patient feels like breathing is the same since last visit. Cough but states that it is hard to get up mucus.   Dyspnea: basically housebound/ walks dog to porch / 25 ft bd Cough: non productive, last cig jun 2021 much better since then  Sleeping: flat bed/ no bipap/ wakes up feeling find SABA use: rarely neb,  Twice a daily proair  never pre challenges or rechallenges  02: has it not using / not tracking sats with ex  Rec Plan A = Automatic = Always=    Symbicort  160 Take 2 puffs first thing in am and then another 2 puffs about 12 hours later.  Work on inhaler technique:     Plan B = Backup (to supplement plan A, not to replace it) Only use your albuterol  inhaler as a rescue medication  Plan C = Crisis (instead of Plan B but only if Plan B stops working) - only use your albuterol  nebulizer if you first try Plan B and it fails to help > ok to use the nebulizer up to every 4 hours but if start needing it regularly call for immediate appointment Make sure you check your oxygen saturations at highest level of activity to be sure it stays over 90%  Please remember to go to the lab and x-ray department at [facility]   for your tests - we will call you with the results when they are available. Please schedule a follow up visit in 3 months with pfts on return    Admitted August 17 2020 to [facility]  p neck fx > [facility] > d/c home with trach  and PEG and f/u by Dr Carlie who rec laser surgery on trach     09/15/23 Moderate to severe emphysema with diffuse bronchial wall thickening.   10/07/2023  f/u ov/[place] office/Meagan Spease re: trach/AB  maint on performis/bud bid  Chief Complaint  Patient presents with   Shortness of Breath   COPD  Dyspnea:  very little activity / no longer even walking to mb  Cough: usual cough > a bit darker mucus x sev weeks Sleeping: flat bed / one pillow mostly on side s    resp cc  SABA use: occ hfa  02:  2-3lpm  Lung cancer screening: 09/15/23  Rec Omnicef  300 mg twice daily x 10days For cough/ congestion > mucinex  or mucinex  dm  up to maximum of  1200 mg every 12 hours  as needed Make sure you check your oxygen saturation  AT  your highest level of activity (not after you stop)   to be sure it stays over 90%      03/29/2024  f/u ov/[place] office/Iley Breeden re: trach/ AB maint on performist / bud bid  and still using ventolin  on a scheduled basis (out of habit per fm)  Chief Complaint  Patient presents with   COPD    Shob - coughing with clear mucus   Dyspnea:  walked in from front door about 100 ft the most he's later Cough: mucus is white / upper airway secretions copious Sleeping: flat bed one pillow on side    resp cc  SABA use: ventolin  out of habit  02: 3lpm 24/ 7    No obvious day to day or daytime variability or assoc  purulent sputum or mucus plugs or hemoptysis or cp or chest tightness, subjective wheeze or overt sinus or hb symptoms.    Also denies any obvious fluctuation of symptoms with weather or environmental changes or other aggravating or alleviating factors except as outlined above   No unusual exposure hx or h/o childhood pna/ asthma or knowledge of premature birth.  Current Allergies, Complete Past Medical History, Past Surgical History, Family History, and Social History were reviewed in [employer] record.  ROS  The following are not active  complaints unless bolded Hoarseness, sore throat, dysphagia, dental problems, itching, sneezing,  nasal congestion or discharge of excess mucus or purulent secretions, ear ache,   fever, chills, sweats, unintended wt loss or wt gain, classically pleuritic or exertional cp,  orthopnea pnd or arm/hand swelling  or leg swelling, presyncope, palpitations, abdominal pain, anorexia, nausea, vomiting, diarrhea  or change in bowel habits or change in bladder habits, change in stools or change in urine, dysuria, hematuria,  rash, arthralgias, visual complaints, headache, numbness, weakness or ataxia or problems with walking or coordination,  change in mood or  memory.        Current Meds  Medication Sig   acetaminophen  (TYLENOL ) 325 MG tablet Take 2 tablets (650 mg total) by mouth every 6 (six) hours as needed for mild pain or moderate pain.   albuterol  (PROAIR  HFA) 108 (90 Base) MCG/ACT inhaler Inhale 2 puffs into the lungs 4 (four) times daily as needed.   albuterol  (PROVENTIL ) (2.5 MG/3ML) 0.083% nebulizer solution Inhale 1 vial (3 mLs) into the lungs via nebulizer every 6 (six) hours as needed for wheezing or shortness of breath   albuterol  (PROVENTIL ) (2.5 MG/3ML) 0.083% nebulizer solution Inhale 3 mLs into the lungs 4 (four) times daily as needed.   aspirin  81 MG chewable tablet Chew 1 tablet (81 mg total) by mouth daily.   buPROPion  (WELLBUTRIN  XL) 300 MG 24 hr tablet Take 1 tablet (300 mg total) by mouth daily.   busPIRone  (BUSPAR ) 10 MG tablet Take 1 tablet (10 mg total) by mouth 2 (two) times daily.   Cholecalciferol (VITAMIN D3) 125 MCG (5000 UT) CAPS Take 5,000 Units by mouth daily.   clonazePAM  (KLONOPIN ) 0.5 MG tablet Take 1 tablet (0.5 mg total) by mouth at bedtime.   finasteride  (PROSCAR ) 5 MG tablet Take 1 tablet (5 mg total) by mouth daily.   Folic Acid  (FOLATE PO) Take 1 tablet by mouth every evening.   formoterol  (PERFOROMIST ) 20 MCG/2ML nebulizer solution Substituted for: Perforomist   Solution Inhale one vial in nebulizer twice a day.   Incontinence Supply Disposable (UNDERPADS EXTRA LARGE) MISC 1 Pad  by Does not apply route in the morning and at bedtime.   methylPREDNISolone  (MEDROL  DOSEPAK) 4 MG TBPK tablet Medrol  Dosepak take as instructed   metoCLOPramide  (REGLAN ) 5 MG tablet Take 1 tablet (5 mg total) by mouth every 6 (six) hours as needed for nausea.   metoprolol  succinate (TOPROL -XL) 25 MG 24 hr tablet Take 0.5 tablets (12.5 mg total) by mouth daily.   Misc. Devices (NORTHROP GRUMMAN) MISC For transporting to and from Doctors appointments   montelukast  (SINGULAIR ) 10 MG tablet Take 1 tablet (10 mg total) by mouth at bedtime.   nitroGLYCERIN  (NITROSTAT ) 0.4 MG SL tablet Place 1 tablet (0.4 mg total) under the tongue every 5 (five) minutes as needed for chest pain.   nystatin ointment (MYCOSTATIN) APPLY AROUND TRACH SITE TWICE DAILY FOR 2 WEEKS. MIX WITH TRIAMCINOLONE .   ondansetron  (ZOFRAN ) 4 MG tablet Take 1 tablet (4 mg total) by mouth every 8 (eight) hours as needed for nausea or vomiting.   oxyCODONE  (OXYCONTIN ) 15 mg 12 hr tablet Take 1 tablet (15 mg total) by mouth every 12 (twelve) hours.   oxyCODONE  (OXYCONTIN ) 15 mg 12 hr tablet Take 1 tablet (15 mg total) by mouth every 12 (twelve) hours.   oxyCODONE  (ROXICODONE ) 5 MG immediate release tablet Take 1 tablet (5 mg total) by mouth in the morning, at noon, in the evening, and at bedtime. DNF 04/03/24   OXYGEN Inhale 3 L into the lungs continuous.   pantoprazole  (PROTONIX ) 40 MG tablet Take 1 tablet (40 mg total) by mouth daily.   polyethylene glycol powder (GOODSENSE CLEARLAX) 17 GM/SCOOP powder Take 17 g by mouth 2 (two) to 3 (three) times daily as needed.   QUEtiapine  (SEROQUEL ) 300 MG tablet Take 0.5 tablets (150 mg total) by mouth 2 (two) times daily AND 1 tablet (300 mg total) at bedtime.   rosuvastatin  (CRESTOR ) 10 MG tablet Take 1 tablet (10 mg total) by mouth daily.   scopolamine  (TRANSDERM-SCOP) 1 MG/3DAYS  Place 1 patch (1 mg total) onto the skin every 3 (three) days.   senna (SENOKOT) 8.6 MG tablet Take 1 tablet by mouth 2 (two) times daily.   sodium chloride  HYPERTONIC 3 % nebulizer solution Take 4 mLs by nebulization 2 (two) times daily.   tamsulosin  (FLOMAX ) 0.4 MG CAPS capsule Take 1 capsule (0.4 mg total) by mouth 2 (two) times daily.   terazosin  (HYTRIN ) 5 MG capsule Take 1 capsule (5 mg total) by mouth at bedtime.   triamcinolone  ointment (KENALOG ) 0.1 % APPLY AROUND TRACH SITE TWICE DAILY FOR 2 WEEKS. MIX WITH NYSTATIN.   UNABLE TO FIND 1 each by Does not apply route daily. Med Name: LIFT CHAIR   UNABLE TO FIND 1 each by Does not apply route daily. Med Name: BATHROOM RENOVATION FOR WALK IN SHOWER   VENTOLIN  HFA 108 (90 Base) MCG/ACT inhaler Inhale 1-2 puffs into the lungs every 4 (four) hours as needed FOR WHEEZING OR SHORTNESS OF BREATH             Past Medical History:  Diagnosis Date   Acute respiratory failure with hypoxia (HCC) 11/04/2019   Anxiety    Arthritis    COPD (chronic obstructive pulmonary disease) (HCC)    COPD with acute exacerbation (HCC) 11/04/2019   Coronary artery disease    Depression    Emphysema of lung (HCC)    Hypertension    Myocardial infarct (HCC)    Oxygen deficiency       Objective:   Wts  03/29/2024     193  10/07/2023      198  03/31/2023    189  12/26/2022        189  09/09/2022      197  03/12/2022    203  12/18/2021        194  06/19/2021        182 12/24/2020        181  05/31/20 194 lb 12.8 oz (88.4 kg)  03/06/20 186 lb (84.4 kg)  01/27/20 186 lb (84.4 kg)   Vital signs reviewed  03/29/2024  - Note at rest 02 sats  96% on 3lpm cont    General appearance:    stoic amb wm nad     HEENT : Oropharynx  clear       NECK :  without  apparent JVD/ palpable Nodes/TM    LUNGS: no acc muscle use,  Min barrel  contour chest wall with bilateral  slightly decreased bs s audible wheeze and  without cough on insp or exp maneuvers and min   Hyperresonant  to  percussion bilaterally    CV:  RRR  no s3 or murmur or increase in P2, and no edema   ABD:  soft and nontender    MS:  Nl gait/ ext warm without deformities Or obvious joint restrictions  calf tenderness, cyanosis or clubbing     SKIN: warm and dry without lesions    NEURO:  alert, approp, nl sensorium with  no motor or cerebellar deficits apparent.             Assessment   Assessment & Plan COPD GOLD 4/ 02 dep  Quit smoking 08/2020/ MM - 01/24/2020  After extensive coaching inhaler device,  effectiveness =    75% (short Ti) > change symb 80 to 160 2bid  -  PFT's   05/01/20   FEV1 0.97 (27 % ) ratio 0.30  p 1 % improvement from saba p symbicort  prior to study with DLCO  11.59 (43%) corrects to 2.0 (45%)  for alv volume and FV curve severe airflow obst with 35% improvement in FVC p saba  - 05/31/2020  After extensive coaching inhaler device,  effectiveness =    Short Ti, prostate problems so rx symbicort  160 2bid and prn saba -  05/31/2020   Walked RA  approx   300 ft  @ moderate pace  stopped due to  Sob at end but sats still 94%    - Labs ordered 12/24/2020  :    alpha one AT phenotype  MM level 154  -  started on bud/ performist 09/2023  -  Chest LDSCT 09/15/23 Moderate to severe emphysema       Group E in terms of symptoms/risk so  laba/lama/ICS  therefore appropriate rx at this point  >>>  laba/ICS at present and next can add Yupelri if covered but he is so sedentary at this point not clear he would benefit so hold off for now and see how motivated he is to start regular sub maximal ex program  Re SABA :  I spent extra time with pt today reviewing appropriate use of albuterol  for prn use on exertion with the following points: 1) saba is for relief of sob that does not improve by walking a slower pace or resting but rather if the pt does not improve after trying this first. 2) If the pt is convinced, as many are, that saba helps recover from  activity faster then it's  easy to tell if this is the case by re-challenging : ie stop, take the inhaler, then p 5 minutes try the exact same activity (intensity of workload) that just caused the symptoms and see if they are substantially diminished or not after saba 3) if there is an activity that reproducibly causes the symptoms, try the saba 15 min before the activity on alternate days   If in fact the saba really does help, then fine to continue to use it prn but advised may need to look closer at the maintenance regimen (ie before / after yupelri addeded) being used to achieve better control of airways disease with exertion.        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 Patient wanted to stop walk after 158ft due to SOB and being unsteady with lowest sats 94%  - 03/31/2023   Walked on 2lpm cont  x  2  lap(s) =  approx 300  ft  @ mod pace, stopped due to fatigue > sob  with lowest 02 sats 90%    - 03/29/2024   Walked on RA  x  1  lap(s) =  approx 150  ft  @ slow to mod pace, stopped due to sob with desats to 87%  then placed on 4lpm cont  but could not continue the walk with sats still around 87%   >>> Rec continue sub max ex with 02 adjust to sats > 90% (will need at least 4lpm for sustained walking )   Solitary pulmonary nodule on lung CT See CT chest  02/29/24 vs 09/15/23  along a band of scarring in the right lower lobe there is a new 2.5 by 2.2 cm nodular component  - PET ordered 03/29/2024   Unlikely to be bronchogrenic ca growing this fast but this is radiology's rec so reasonable to close the loop this way if insurance will cover it.   Discussed in detail all the  indications, usual  risks and alternatives  relative to the benefits with patient who agrees to proceed with w/u as outlined.       AVS  Patient Instructions  My office will be contacting  you by phone for referral for PET scan at [facility]  - if you don't hear back from my office within one week please call us  back or notify us  thru MyChart and we'll address it right away.   Make sure you check your oxygen saturation  AT  your highest level of activity (not after you stop)   to be sure it stays over 90% and adjust  02 flow upward to maintain this level if needed but remember to turn it back to previous settings when you stop (to conserve your supply).   Remember to try the shopping cart in the parking lot  for balance and to place your 02 into to make it easier to get out and get some exercise.    Ok to try albuterol  15 min before an activity (on alternating days)  that you know would usually make you short of breath and see if it makes any difference and if makes none then don't take albuterol  after activity unless you can't catch your breath as this means it's the resting that helps, not the albuterol .  Please schedule a follow up visit in  6 months but call sooner if needed    Ozell America, MD 03/29/2024

## 2024-04-01 ENCOUNTER — Other Ambulatory Visit (HOSPITAL_COMMUNITY): Payer: Self-pay

## 2024-04-03 ENCOUNTER — Other Ambulatory Visit (HOSPITAL_COMMUNITY): Payer: Self-pay

## 2024-04-04 ENCOUNTER — Other Ambulatory Visit: Payer: Self-pay

## 2024-04-07 ENCOUNTER — Ambulatory Visit (HOSPITAL_COMMUNITY)
Admission: RE | Admit: 2024-04-07 | Discharge: 2024-04-07 | Disposition: A | Discharge status: Home / Self Care | Source: Ambulatory Visit | Attending: Internal Medicine | Admitting: Internal Medicine

## 2024-04-07 DIAGNOSIS — R911 Solitary pulmonary nodule: Secondary | ICD-10-CM | POA: Insufficient documentation

## 2024-04-07 MED ORDER — FLUDEOXYGLUCOSE F - 18 (FDG) INJECTION
10.0800 | Freq: Once | INTRAVENOUS | Status: AC | PRN
  Administered 2024-04-07: 10.08 via INTRAVENOUS

## 2024-04-11 ENCOUNTER — Other Ambulatory Visit (HOSPITAL_COMMUNITY): Payer: Self-pay

## 2024-04-11 ENCOUNTER — Other Ambulatory Visit: Payer: Self-pay

## 2024-04-13 ENCOUNTER — Ambulatory Visit: Payer: Self-pay | Admitting: Internal Medicine

## 2024-04-13 ENCOUNTER — Other Ambulatory Visit: Payer: Self-pay

## 2024-04-13 ENCOUNTER — Other Ambulatory Visit (HOSPITAL_COMMUNITY): Payer: Self-pay

## 2024-04-13 NOTE — Telephone Encounter (Signed)
 Called and relayed results to pts sister vickey in [place] - and she confirmed understanding.   Pt needs 3 month OV with Wert

## 2024-04-15 ENCOUNTER — Other Ambulatory Visit: Payer: Self-pay

## 2024-04-15 ENCOUNTER — Other Ambulatory Visit (HOSPITAL_COMMUNITY): Payer: Self-pay

## 2024-04-15 MED ORDER — PROAIR RESPICLICK 108 (90 BASE) MCG/ACT IN AEPB
2.0000 | INHALATION_SPRAY | Freq: Four times a day (QID) | RESPIRATORY_TRACT | 4 refills | Status: AC | PRN
  Filled 2024-04-15: qty 1, 25d supply, fill #0

## 2024-04-18 ENCOUNTER — Other Ambulatory Visit (HOSPITAL_COMMUNITY): Payer: Self-pay

## 2024-04-18 ENCOUNTER — Other Ambulatory Visit: Payer: Self-pay

## 2024-04-18 NOTE — Telephone Encounter (Signed)
 Pharmacy Patient Advocate Encounter  Received notification from [facility] MEDICAID that Prior Authorization for Oxycontin  ER 15 mg tablets  has been APPROVED from -- to --. Ran test claim, Copay is $0. This test claim was processed through [facility]- copay amounts may vary at other pharmacies due to pharmacy/plan contracts, or as the patient moves through the different stages of their insurance plan.   PA #/Case ID/Reference #: [id number] W

## 2024-04-19 ENCOUNTER — Other Ambulatory Visit: Payer: Self-pay

## 2024-04-19 ENCOUNTER — Encounter: Attending: Physical Medicine & Rehabilitation | Admitting: Registered Nurse

## 2024-04-19 ENCOUNTER — Other Ambulatory Visit (HOSPITAL_COMMUNITY): Payer: Self-pay

## 2024-04-19 VITALS — BP 99/69 | HR 87 | Ht 68.0 in | Wt 195.0 lb

## 2024-04-19 DIAGNOSIS — Z79899 Other long term (current) drug therapy: Secondary | ICD-10-CM | POA: Insufficient documentation

## 2024-04-19 DIAGNOSIS — G8929 Other chronic pain: Secondary | ICD-10-CM | POA: Insufficient documentation

## 2024-04-19 DIAGNOSIS — M545 Low back pain, unspecified: Secondary | ICD-10-CM | POA: Insufficient documentation

## 2024-04-19 DIAGNOSIS — M546 Pain in thoracic spine: Secondary | ICD-10-CM | POA: Insufficient documentation

## 2024-04-19 DIAGNOSIS — G894 Chronic pain syndrome: Secondary | ICD-10-CM | POA: Insufficient documentation

## 2024-04-19 DIAGNOSIS — J449 Chronic obstructive pulmonary disease, unspecified: Secondary | ICD-10-CM | POA: Insufficient documentation

## 2024-04-19 DIAGNOSIS — Z5181 Encounter for therapeutic drug level monitoring: Secondary | ICD-10-CM | POA: Insufficient documentation

## 2024-04-19 MED ORDER — OXYCODONE HCL 5 MG PO TABS
5.0000 mg | ORAL_TABLET | Freq: Four times a day (QID) | ORAL | 0 refills | Status: AC
  Filled 2024-04-19 – 2024-05-02 (×2): qty 120, 30d supply, fill #0

## 2024-04-19 MED ORDER — OXYCODONE HCL ER 15 MG PO T12A
15.0000 mg | EXTENDED_RELEASE_TABLET | Freq: Two times a day (BID) | ORAL | 0 refills | Status: AC
  Filled 2024-04-19 – 2024-04-21 (×2): qty 60, 30d supply, fill #0

## 2024-04-19 MED ORDER — VENTOLIN HFA 108 (90 BASE) MCG/ACT IN AERS
1.0000 | INHALATION_SPRAY | RESPIRATORY_TRACT | 0 refills | Status: AC | PRN
  Filled 2024-04-19: qty 18, 17d supply, fill #0

## 2024-04-19 NOTE — Progress Notes (Unsigned)
 Subjective:    Patient ID: Charles Weeks, male    DOB: 27-Apr-1966, 58 y.o.   MRN: [id number]  HPI: Charles Weeks is a 58 y.o. male who returns for follow up appointment for chronic pain and medication refill. states *** pain is located in  ***. rates pain ***. current exercise regime is walking and performing stretching exercises.  Ms. Herberg Morphine  equivalent is 75.00  MME.   Oral Swab was Performed today.     Pain Inventory Average Pain 8 Pain Right Now 8 My pain is sharp, burning, dull, stabbing, tingling, and aching  In the last 24 hours, has pain interfered with the following? General activity 9 Relation with others 7 Enjoyment of life 10 What TIME of day is your pain at its worst? morning  and night Sleep (in general) Fair  Pain is worse with: walking, bending, sitting, inactivity, standing, and some activites Pain improves with: heat/ice, pacing activities, and medication Relief from Meds: 2  Family History  Problem Relation Age of Onset   Heart disease Mother    Heart disease Father    Diabetes Sister    Social History   Socioeconomic History   Marital status: Divorced    Spouse name: Not on file   Number of children: 2   Years of education: Not on file   Highest education level: 7th grade  Occupational History   Not on file  Tobacco Use   Smoking status: Former    Current packs/day: 0.00    Types: Cigarettes    Quit date: 08/17/2020    Years since quitting: 3.6   Smokeless tobacco: Never  Vaping Use   Vaping status: Never Used  Substance and Sexual Activity   Alcohol use: Not Currently   Drug use: Not Currently   Sexual activity: Not on file  Other Topics Concern   Not on file  Social History Narrative   ** Merged History Encounter **       Lives with sister  Both daughters live close by   Sister has dog   Enjoys: shooting pool, fishing, therapist, nutritional  Diet: eats all food groups Caffeine: pop-dr pepper 2 liter  Water : 4-6 cups    Wears seat belt  No driving for him Psychologist, sport and exercise  Fi   re Extinguisher No weapons        Social Drivers of Corporate Investment Banker Strain: Low Risk  (04/21/2023)   Overall Financial Resource Strain (CARDIA)    Difficulty of Paying Living Expenses: Not hard at all  Food Insecurity: No Food Insecurity (02/29/2024)   Hunger Vital Sign    Worried About Running Out of Food in the Last Year: Never true    Ran Out of Food in the Last Year: Never true  Transportation Needs: No Transportation Needs (02/29/2024)   PRAPARE - Administrator, Civil Service (Medical): No    Lack of Transportation (Non-Medical): No  Physical Activity: Unknown (04/21/2023)   Exercise Vital Sign    Days of Exercise per Week: 0 days    Minutes of Exercise per Session: Not on file  Stress: No Stress Concern Present (04/21/2023)   [employer] of Occupational Health - Occupational Stress Questionnaire    Feeling of Stress : Only a little  Social Connections: Socially Isolated (04/21/2023)   Social Connection and Isolation Panel    Frequency of Communication with Friends and Family: More than three times a week    Frequency of Social Gatherings  with Friends and Family: Once a week    Attends Religious Services: Never    Database Administrator or Organizations: No    Attends Engineer, Structural: Not on file    Marital Status: Divorced   Past Surgical History:  Procedure Laterality Date   BALLOON DILATION N/A 02/06/2021   Procedure: BALLOON DILATION;  Surgeon: Carlie Clark, MD;  Location: [facility] OR;  Service: ENT;  Laterality: N/A;   BOWEL RESECTION  2006   COLONOSCOPY  03/2022   [facility]   MICROLARYNGOSCOPY WITH LASER AND BALLOON DILATION N/A 02/06/2021   Procedure: SUSPENDED MICRO DIRECT LARYNGOSCOPY;  Surgeon: Carlie Clark, MD;  Location: [facility] OR;  Service: ENT;  Laterality: N/A;   PEG PLACEMENT N/A 09/03/2020   Procedure: PERCUTANEOUS ENDOSCOPIC GASTROSTOMY (PEG) PLACEMENT;   Surgeon: Sebastian Moles, MD;  Location: [facility] OR;  Service: General;  Laterality: N/A;   PERCUTANEOUS TRACHEOSTOMY N/A 09/03/2020   Procedure: PERCUTANEOUS TRACHEOSTOMY USING 6 SHILEY;  Surgeon: Sebastian Moles, MD;  Location: [facility] OR;  Service: General;  Laterality: N/A;   SKIN GRAFT     TONSILLECTOMY AND ADENOIDECTOMY     Past Surgical History:  Procedure Laterality Date   BALLOON DILATION N/A 02/06/2021   Procedure: BALLOON DILATION;  Surgeon: Carlie Clark, MD;  Location: [facility] OR;  Service: ENT;  Laterality: N/A;   BOWEL RESECTION  2006   COLONOSCOPY  03/2022   [facility]   MICROLARYNGOSCOPY WITH LASER AND BALLOON DILATION N/A 02/06/2021   Procedure: SUSPENDED MICRO DIRECT LARYNGOSCOPY;  Surgeon: Carlie Clark, MD;  Location: [facility] OR;  Service: ENT;  Laterality: N/A;   PEG PLACEMENT N/A 09/03/2020   Procedure: PERCUTANEOUS ENDOSCOPIC GASTROSTOMY (PEG) PLACEMENT;  Surgeon: Sebastian Moles, MD;  Location: [facility] OR;  Service: General;  Laterality: N/A;   PERCUTANEOUS TRACHEOSTOMY N/A 09/03/2020   Procedure: PERCUTANEOUS TRACHEOSTOMY USING 6 SHILEY;  Surgeon: Sebastian Moles, MD;  Location: [facility] OR;  Service: General;  Laterality: N/A;   SKIN GRAFT     TONSILLECTOMY AND ADENOIDECTOMY     Past Medical History:  Diagnosis Date   Anxiety 01/19/2020   Arthritis    Asthma    Benign prostatic hyperplasia with weak urinary stream 12/08/2019   BPH (benign prostatic hyperplasia) 08/17/2020   Chronic respiratory failure with hypoxia (HCC) 01/24/2020   Has home 02 but not using as of 01/24/2020  -  01/24/2020   Walked RA  approx   200 ft  @ slow pace  stopped due to  Dizzy with sats still 95%      Cigarette smoker 01/24/2020   Stopped regular smoking 10/2019 but still now and then as of 01/24/2020    COPD (chronic obstructive pulmonary disease) (HCC) 08/17/2020   gold   COPD GOLD ?     Quit smoking 10/2019  - Labs ordered 01/24/2020  :  allergy profile   alpha one AT phenotype   - 01/24/2020  After extensive coaching  inhaler device,  effectiveness =    75% (short Ti) > change symb 80 to 160 2bid      Coronary artery disease 06/08/2018   DES to RCA, [facility] [place], [place])   Depression, major, single episode, moderate (HCC) 01/19/2020   Dyspnea    Encounter for screening for malignant neoplasm of colon 12/08/2019   Essential hypertension 12/08/2019   History of MI (myocardial infarction) 01/19/2020   HTN (hypertension) 08/17/2020   Myocardial infarct (HCC) 2019   Pneumonia    Positive colorectal cancer screening using Cologuard  test    BP 99/69   Pulse 87   Ht 5' 8 (1.727 m)   Wt 195 lb (88.5 kg)   SpO2 94%   BMI 29.65 kg/m   Opioid Risk Score:   Fall Risk Score:  `1  Depression screen PHQ 2/9     12/22/2023    1:27 PM 10/23/2023    1:43 PM 10/21/2023    1:46 PM 08/18/2023    1:18 PM 06/25/2023    1:21 PM 04/21/2023    1:10 PM 02/18/2023    1:13 PM  Depression screen PHQ 2/9  Decreased Interest 3 0 1 2 2  0 0  Down, Depressed, Hopeless 3 0 1 2 2  0 0  PHQ - 2 Score 6 0 2 4 4  0 0  Altered sleeping 3 0 1      Tired, decreased energy 3 0 1      Change in appetite  0 0      Feeling bad or failure about yourself  0 0 1      Trouble concentrating 0 0 1      Moving slowly or fidgety/restless  0 0      Suicidal thoughts 0 0 0      PHQ-9 Score 12  0  6       Difficult doing work/chores Very difficult Not difficult at all Somewhat difficult         Data saved with a previous flowsheet row definition     Review of Systems  Musculoskeletal:  Positive for back pain.  All other systems reviewed and are negative.      Objective:   Physical Exam        Assessment & Plan:  Thoracic Spine Fracture:  Continue HEP as Tolerated. Continue to Monitor. 02/18/2024 2. 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 Continue to Monitor. 02/18/2024 3.Subglottic Stenosis/Tracheostomy: Jamal Chu with 2 liters of Oxygen: Dr. Llewellyn Following. Continue to  Monitor. 02/18/2024 4. Urinary Retention due to BPH: Continue current medication regimen with Hytrin , Flomax  and Urecholine . 02/18/2024 4. Chronic  Pain:Syndrome Refilled: Oxycontin  15 mg every 12 hours# 60 and  and Oxycodone  5 mg 4 times a day as needed for pain #120.  We will continue the opioid monitoring program, this consists of regular clinic visits, examinations, urine drug screen, pill counts as well as use of [place]  Controlled Substance Reporting system. A 12 month History has been reviewed on the [place]  Controlled Substance Reporting System on 02/18/2024 5. Slow Transit Constipation: Continue with Bowel Regimen. Continue to Monitor. 02/18/2024   F/U in 2 months

## 2024-04-20 ENCOUNTER — Other Ambulatory Visit (HOSPITAL_BASED_OUTPATIENT_CLINIC_OR_DEPARTMENT_OTHER): Payer: Self-pay

## 2024-04-20 ENCOUNTER — Encounter: Payer: Self-pay | Admitting: Registered Nurse

## 2024-04-20 ENCOUNTER — Other Ambulatory Visit: Payer: Self-pay

## 2024-04-20 ENCOUNTER — Other Ambulatory Visit (HOSPITAL_COMMUNITY): Payer: Self-pay

## 2024-04-21 ENCOUNTER — Other Ambulatory Visit (HOSPITAL_COMMUNITY): Payer: Self-pay

## 2024-04-21 ENCOUNTER — Other Ambulatory Visit: Payer: Self-pay

## 2024-04-22 ENCOUNTER — Other Ambulatory Visit (HOSPITAL_COMMUNITY): Payer: Self-pay

## 2024-04-22 LAB — DRUG TOX MONITOR 1 W/CONF, ORAL FLD
AMINOCLONAZEPAM: 0.97 ng/mL — ABNORMAL HIGH (ref <0.50)
Alprazolam: NEGATIVE ng/mL (ref <0.50)
Amphetamines: NEGATIVE ng/mL (ref <10)
Barbiturates: NEGATIVE ng/mL (ref <10)
Benzodiazepines: POSITIVE ng/mL — AB (ref <0.50)
Buprenorphine: NEGATIVE ng/mL (ref <0.10)
Chlordiazepoxide: NEGATIVE ng/mL (ref <0.50)
Clonazepam: NEGATIVE ng/mL (ref <0.50)
Cocaine: NEGATIVE ng/mL (ref <5.0)
Codeine: NEGATIVE ng/mL (ref <2.5)
Diazepam: NEGATIVE ng/mL (ref <0.50)
Dihydrocodeine: NEGATIVE ng/mL (ref <2.5)
Fentanyl: NEGATIVE ng/mL (ref <0.10)
Flunitrazepam: NEGATIVE ng/mL (ref <0.50)
Flurazepam: NEGATIVE ng/mL (ref <0.50)
Heroin Metabolite: NEGATIVE ng/mL (ref <1.0)
Hydrocodone: NEGATIVE ng/mL (ref <2.5)
Hydromorphone: NEGATIVE ng/mL (ref <2.5)
Lorazepam: NEGATIVE ng/mL (ref <0.50)
MARIJUANA: NEGATIVE ng/mL (ref <2.5)
MDMA: NEGATIVE ng/mL (ref <10)
Meprobamate: NEGATIVE ng/mL (ref <2.5)
Methadone: NEGATIVE ng/mL (ref <5.0)
Midazolam: NEGATIVE ng/mL (ref <0.50)
Morphine: NEGATIVE ng/mL (ref <2.5)
Nicotine Metabolite: NEGATIVE ng/mL (ref <5.0)
Nordiazepam: NEGATIVE ng/mL (ref <0.50)
Norhydrocodone: NEGATIVE ng/mL (ref <2.5)
Noroxycodone: 206.9 ng/mL — ABNORMAL HIGH (ref <2.5)
Opiates: POSITIVE ng/mL — AB (ref <2.5)
Oxazepam: NEGATIVE ng/mL (ref <0.50)
Oxycodone: >250 ng/mL — ABNORMAL HIGH (ref <2.5)
Phencyclidine: NEGATIVE ng/mL (ref <10)
Tapentadol: NEGATIVE ng/mL (ref <5.0)
Temazepam: NEGATIVE ng/mL (ref <0.50)
Tramadol: NEGATIVE ng/mL (ref <5.0)
Triazolam: NEGATIVE ng/mL (ref <0.50)
Zolpidem: NEGATIVE ng/mL (ref <5.0)

## 2024-04-22 LAB — DRUG TOX ALC METAB W/CON, ORAL FLD: Alcohol Metabolite: NEGATIVE ng/mL (ref <25)

## 2024-04-22 MED ORDER — ALBUTEROL SULFATE HFA 108 (90 BASE) MCG/ACT IN AERS
1.0000 | INHALATION_SPRAY | RESPIRATORY_TRACT | 0 refills | Status: DC | PRN
  Filled 2024-04-22: qty 6.7, 20d supply, fill #0

## 2024-04-25 ENCOUNTER — Other Ambulatory Visit (HOSPITAL_COMMUNITY): Payer: Self-pay

## 2024-04-25 ENCOUNTER — Other Ambulatory Visit: Payer: Self-pay

## 2024-04-27 ENCOUNTER — Other Ambulatory Visit (HOSPITAL_COMMUNITY): Payer: Self-pay

## 2024-04-27 ENCOUNTER — Other Ambulatory Visit: Payer: Self-pay

## 2024-04-29 ENCOUNTER — Other Ambulatory Visit (HOSPITAL_COMMUNITY): Payer: Self-pay

## 2024-04-29 ENCOUNTER — Other Ambulatory Visit: Payer: Self-pay

## 2024-04-29 DIAGNOSIS — F411 Generalized anxiety disorder: Secondary | ICD-10-CM

## 2024-04-29 MED ORDER — BUSPIRONE HCL 10 MG PO TABS
10.0000 mg | ORAL_TABLET | Freq: Two times a day (BID) | ORAL | 3 refills | Status: AC
  Filled 2024-05-02: qty 60, 30d supply, fill #0
  Filled 2024-05-13 – 2024-06-03 (×2): qty 60, 30d supply, fill #1

## 2024-05-02 ENCOUNTER — Other Ambulatory Visit (HOSPITAL_COMMUNITY): Payer: Self-pay

## 2024-05-02 ENCOUNTER — Other Ambulatory Visit: Payer: Self-pay

## 2024-05-04 ENCOUNTER — Other Ambulatory Visit (HOSPITAL_COMMUNITY): Payer: Self-pay

## 2024-05-06 ENCOUNTER — Other Ambulatory Visit (HOSPITAL_COMMUNITY): Payer: Self-pay

## 2024-05-06 ENCOUNTER — Other Ambulatory Visit: Payer: Self-pay

## 2024-05-06 ENCOUNTER — Telehealth: Payer: Self-pay | Admitting: Registered Nurse

## 2024-05-06 DIAGNOSIS — G8929 Other chronic pain: Secondary | ICD-10-CM

## 2024-05-06 DIAGNOSIS — G894 Chronic pain syndrome: Secondary | ICD-10-CM

## 2024-05-06 MED ORDER — OXYCODONE HCL ER 15 MG PO T12A
15.0000 mg | EXTENDED_RELEASE_TABLET | Freq: Two times a day (BID) | ORAL | 0 refills | Status: DC
  Filled 2024-05-06 – 2024-05-20 (×2): qty 60, 30d supply, fill #0

## 2024-05-06 MED ORDER — OXYCODONE HCL 5 MG PO TABS
5.0000 mg | ORAL_TABLET | Freq: Four times a day (QID) | ORAL | 0 refills | Status: DC
  Filled 2024-05-06 – 2024-05-31 (×2): qty 120, 30d supply, fill #0

## 2024-05-06 NOTE — Telephone Encounter (Signed)
 PDMP was Reviewed.  Oxycontin  and Oxycodone  e- scribed to pharmacy.  Mr. Laws sister Boby is aware via My-Chart

## 2024-05-07 ENCOUNTER — Other Ambulatory Visit: Payer: Self-pay | Admitting: Internal Medicine

## 2024-05-07 DIAGNOSIS — J449 Chronic obstructive pulmonary disease, unspecified: Secondary | ICD-10-CM

## 2024-05-08 ENCOUNTER — Other Ambulatory Visit (HOSPITAL_COMMUNITY): Payer: Self-pay

## 2024-05-09 ENCOUNTER — Other Ambulatory Visit (HOSPITAL_COMMUNITY): Payer: Self-pay

## 2024-05-09 ENCOUNTER — Other Ambulatory Visit: Payer: Self-pay

## 2024-05-09 MED ORDER — ALBUTEROL SULFATE HFA 108 (90 BASE) MCG/ACT IN AERS
1.0000 | INHALATION_SPRAY | RESPIRATORY_TRACT | 0 refills | Status: DC | PRN
  Filled 2024-05-09: qty 6.7, 25d supply, fill #0

## 2024-05-09 MED FILL — Montelukast Sodium Tab 10 MG (Base Equiv): 10.0000 mg | ORAL | 90 days supply | Qty: 90 | Fill #0 | Status: AC

## 2024-05-13 ENCOUNTER — Other Ambulatory Visit: Payer: Self-pay

## 2024-05-13 ENCOUNTER — Other Ambulatory Visit (HOSPITAL_COMMUNITY): Payer: Self-pay

## 2024-05-17 ENCOUNTER — Ambulatory Visit

## 2024-05-20 ENCOUNTER — Other Ambulatory Visit (HOSPITAL_COMMUNITY): Payer: Self-pay

## 2024-05-20 ENCOUNTER — Other Ambulatory Visit: Payer: Self-pay

## 2024-05-20 MED ORDER — ALBUTEROL SULFATE HFA 108 (90 BASE) MCG/ACT IN AERS
1.0000 | INHALATION_SPRAY | RESPIRATORY_TRACT | 5 refills | Status: AC | PRN
  Filled 2024-05-20: qty 6.7, 30d supply, fill #0
  Filled 2024-05-23: qty 6.7, 25d supply, fill #0
  Filled 2024-05-30: qty 6.7, 30d supply, fill #0
  Filled 2024-06-21: qty 6.7, 30d supply, fill #1

## 2024-05-20 MED ORDER — ALBUTEROL SULFATE (2.5 MG/3ML) 0.083% IN NEBU
3.0000 mL | INHALATION_SOLUTION | Freq: Four times a day (QID) | RESPIRATORY_TRACT | 5 refills | Status: AC | PRN
  Filled 2024-05-20: qty 300, 25d supply, fill #0
  Filled 2024-05-24: qty 360, 30d supply, fill #0

## 2024-05-23 ENCOUNTER — Other Ambulatory Visit (HOSPITAL_COMMUNITY): Payer: Self-pay

## 2024-05-23 ENCOUNTER — Other Ambulatory Visit: Payer: Self-pay

## 2024-05-24 ENCOUNTER — Other Ambulatory Visit: Payer: Self-pay

## 2024-05-24 ENCOUNTER — Other Ambulatory Visit (HOSPITAL_COMMUNITY): Payer: Self-pay

## 2024-05-25 ENCOUNTER — Other Ambulatory Visit: Payer: Self-pay

## 2024-05-25 ENCOUNTER — Other Ambulatory Visit (HOSPITAL_COMMUNITY): Payer: Self-pay

## 2024-05-30 ENCOUNTER — Other Ambulatory Visit: Payer: Self-pay

## 2024-05-30 ENCOUNTER — Other Ambulatory Visit (HOSPITAL_COMMUNITY): Payer: Self-pay

## 2024-05-31 ENCOUNTER — Other Ambulatory Visit (HOSPITAL_COMMUNITY): Payer: Self-pay

## 2024-05-31 ENCOUNTER — Other Ambulatory Visit: Payer: Self-pay

## 2024-06-01 ENCOUNTER — Other Ambulatory Visit: Payer: Self-pay

## 2024-06-01 ENCOUNTER — Other Ambulatory Visit (HOSPITAL_COMMUNITY): Payer: Self-pay

## 2024-06-02 ENCOUNTER — Other Ambulatory Visit (HOSPITAL_COMMUNITY): Payer: Self-pay

## 2024-06-02 ENCOUNTER — Other Ambulatory Visit: Payer: Self-pay

## 2024-06-02 ENCOUNTER — Telehealth (HOSPITAL_COMMUNITY): Payer: Self-pay | Admitting: Pharmacy Technician

## 2024-06-03 ENCOUNTER — Telehealth (HOSPITAL_COMMUNITY): Payer: Self-pay

## 2024-06-03 ENCOUNTER — Other Ambulatory Visit: Payer: Self-pay

## 2024-06-03 ENCOUNTER — Other Ambulatory Visit (HOSPITAL_COMMUNITY): Payer: Self-pay

## 2024-06-03 NOTE — Telephone Encounter (Signed)
 Pharmacy Patient Advocate Encounter   Received notification from Pt Calls Messages that prior authorization for clonazePAM  0.5MG  tablet  is required/requested.   Insurance verification completed.   The patient is insured through [employer].   Per test claim: PA required; PA submitted to above mentioned insurance via Latent Key/confirmation #/EOC [id number] Status is pending

## 2024-06-06 ENCOUNTER — Other Ambulatory Visit (HOSPITAL_COMMUNITY): Payer: Self-pay

## 2024-06-06 ENCOUNTER — Ambulatory Visit: Admitting: Urology

## 2024-06-06 ENCOUNTER — Other Ambulatory Visit: Payer: Self-pay

## 2024-06-06 VITALS — BP 103/66 | HR 84

## 2024-06-06 DIAGNOSIS — N401 Enlarged prostate with lower urinary tract symptoms: Secondary | ICD-10-CM

## 2024-06-06 MED ORDER — TERAZOSIN HCL 2 MG PO CAPS
2.0000 mg | ORAL_CAPSULE | Freq: Every day | ORAL | 0 refills | Status: AC
  Filled 2024-06-06: qty 30, 30d supply, fill #0

## 2024-06-06 NOTE — Progress Notes (Unsigned)
 "  06/06/2024 3:41 PM   Charles Weeks [birth year] [id number]  Referring provider: Bevely Doffing, FNP [phone] [place],  [place] [address]  No chief complaint on file.   HPI:  F/u -    1) BPH - on tamsulosin  and terazosin  5 mg. He had a prostate procedure in [place] around 2020. A 2023 CT with mild BPH - 30 g. His 2024 Cr 0.91. He voids with an adequate stream but it can stop and again and take quite a bit of time. No frequency. Some urgency and UUI. Could not leave specimen and did not fill out IPSS. He fell in 2022 and has a trach. Uses home 02. Broke back and neck. He is on bethanechol  and had retention in hospital. His PSA was 0.7 in 2025.    We added finasteride  Apr 2025 to improve emptying and stopping bethanechol . If frequency continues we could consider then adding an OAB med.   06/06/2024: Today, patient seen for the above.  He stopped bethanechol .  They want to reduce his medicines. His stream is OK. No frequency or urgency. IPSS down 7. They are very pleased. No dysuria or gross hematuria.   UA clear.    No OAC. H/o MI. Carries NTG. Has trach and home O2. Here with personal care assistant.   ********************** A/P:  BPH with lower urinary tract symptoms-he will stop terazosin .  Continue tamsulosin  and finasteride .Wean off terazosin . Start 2 mg capsule for 2.5 weeks and then every other day 2.5 weeks and stop. I sent a Rx for 2 mg.   PMH: Past Medical History:  Diagnosis Date   Anxiety 01/19/2020   Arthritis    Asthma    Benign prostatic hyperplasia with weak urinary stream 12/08/2019   BPH (benign prostatic hyperplasia) 08/17/2020   Chronic respiratory failure with hypoxia (HCC) 01/24/2020   Has home 02 but not using as of 01/24/2020  -  01/24/2020   Walked RA  approx   200 ft  @ slow pace  stopped due to  Dizzy with sats still 95%      Cigarette smoker 01/24/2020   Stopped regular smoking 10/2019 but still now and then as of 01/24/2020    COPD  (chronic obstructive pulmonary disease) (HCC) 08/17/2020   gold   COPD GOLD ?     Quit smoking 10/2019  - Labs ordered 01/24/2020  :  allergy profile   alpha one AT phenotype   - 01/24/2020  After extensive coaching inhaler device,  effectiveness =    75% (short Ti) > change symb 80 to 160 2bid      Coronary artery disease 06/08/2018   DES to RCA, [facility] [place])   Depression, major, single episode, moderate (HCC) 01/19/2020   Dyspnea    Encounter for screening for malignant neoplasm of colon 12/08/2019   Essential hypertension 12/08/2019   History of MI (myocardial infarction) 01/19/2020   HTN (hypertension) 08/17/2020   Myocardial infarct (HCC) 2019   Pneumonia    Positive colorectal cancer screening using Cologuard test     Surgical History: Past Surgical History:  Procedure Laterality Date   BALLOON DILATION N/A 02/06/2021   Procedure: BALLOON DILATION;  Surgeon: Carlie Clark, MD;  Location: [facility] OR;  Service: ENT;  Laterality: N/A;   BOWEL RESECTION  2006   COLONOSCOPY  03/2022   [facility]   MICROLARYNGOSCOPY WITH LASER AND BALLOON DILATION N/A 02/06/2021   Procedure: SUSPENDED MICRO DIRECT LARYNGOSCOPY;  Surgeon: Carlie,  Vaughan, MD;  Location: [facility] OR;  Service: ENT;  Laterality: N/A;   PEG PLACEMENT N/A 09/03/2020   Procedure: PERCUTANEOUS ENDOSCOPIC GASTROSTOMY (PEG) PLACEMENT;  Surgeon: Sebastian Moles, MD;  Location: [facility] OR;  Service: General;  Laterality: N/A;   PERCUTANEOUS TRACHEOSTOMY N/A 09/03/2020   Procedure: PERCUTANEOUS TRACHEOSTOMY USING 6 SHILEY;  Surgeon: Sebastian Moles, MD;  Location: [facility] OR;  Service: General;  Laterality: N/A;   SKIN GRAFT     TONSILLECTOMY AND ADENOIDECTOMY      Home Medications:  Allergies as of 06/06/2024       Reactions   Ciprofloxacin  Hives        Medication List        Accurate as of June 06, 2024  3:41 PM. If you have any questions, ask your nurse or doctor.          acetaminophen  325 MG tablet Commonly  known as: TYLENOL  Take 2 tablets (650 mg total) by mouth every 6 (six) hours as needed for mild pain or moderate pain.   albuterol  (2.5 MG/3ML) 0.083% nebulizer solution Commonly known as: PROVENTIL  Inhale 1 vial (3 mLs) into the lungs via nebulizer every 6 (six) hours as needed for wheezing or shortness of breath   albuterol  (2.5 MG/3ML) 0.083% nebulizer solution Commonly known as: PROVENTIL  Inhale 3 mLs into the lungs 4 (four) times daily as needed.   ProAir  RespiClick 108 (90 Base) MCG/ACT Aepb Generic drug: Albuterol  Sulfate Take 2 puffs by mouth 4 (four) times daily as needed.   Ventolin  HFA 108 (90 Base) MCG/ACT inhaler Generic drug: albuterol  Inhale 1-2 puffs into the lungs every 4 (four) hours as needed FOR WHEEZING OR SHORTNESS OF BREATH   albuterol  (2.5 MG/3ML) 0.083% nebulizer solution Commonly known as: PROVENTIL  Inhale 3 mLs by nebulization 4 (four) times daily as needed.   albuterol  108 (90 Base) MCG/ACT inhaler Commonly known as: Ventolin  HFA Inhale 1 puff into the lungs as needed.   aspirin  81 MG chewable tablet Chew 1 tablet (81 mg total) by mouth daily.   buPROPion  300 MG 24 hr tablet Commonly known as: WELLBUTRIN  XL Take 1 tablet (300 mg total) by mouth daily.   busPIRone  10 MG tablet Commonly known as: BUSPAR  Take 1 tablet (10 mg total) by mouth 2 (two) times daily.   clonazePAM  0.5 MG tablet Commonly known as: KLONOPIN  Take 1 tablet (0.5 mg total) by mouth at bedtime.   finasteride  5 MG tablet Commonly known as: PROSCAR  Take 1 tablet (5 mg total) by mouth daily.   FOLATE PO Take 1 tablet by mouth every evening.   formoterol  20 MCG/2ML nebulizer solution Commonly known as: PERFOROMIST  Substituted for: Perforomist  Solution Inhale one vial in nebulizer twice a day.   methylPREDNISolone  4 MG Tbpk tablet Commonly known as: MEDROL  DOSEPAK Medrol  Dosepak take as instructed   metoCLOPramide  5 MG tablet Commonly known as: Reglan  Take 1 tablet (5  mg total) by mouth every 6 (six) hours as needed for nausea.   metoprolol  succinate 25 MG 24 hr tablet Commonly known as: TOPROL -XL Take 0.5 tablets (12.5 mg total) by mouth daily.   montelukast  10 MG tablet Commonly known as: SINGULAIR  Take 1 tablet (10 mg total) by mouth at bedtime.   nitroGLYCERIN  0.4 MG SL tablet Commonly known as: NITROSTAT  Place 1 tablet (0.4 mg total) under the tongue every 5 (five) minutes as needed for chest pain.   nystatin ointment Commonly known as: MYCOSTATIN APPLY AROUND TRACH SITE TWICE DAILY FOR 2 WEEKS. MIX WITH  TRIAMCINOLONE .   ondansetron  4 MG tablet Commonly known as: ZOFRAN  Take 1 tablet (4 mg total) by mouth every 8 (eight) hours as needed for nausea or vomiting.   OxyCONTIN  15 MG 12 hr tablet Generic drug: oxyCODONE  Take 1 tablet (15 mg total) by mouth every 12 (twelve) hours.   OxyCONTIN  15 MG 12 hr tablet Generic drug: oxyCODONE  Take 1 tablet (15 mg total) by mouth every 12 (twelve) hours.   oxyCODONE  5 MG immediate release tablet Commonly known as: Roxicodone  Take 1 tablet (5 mg total) by mouth in the morning, at noon, in the evening, and at bedtime. DNF 05/29/24   OXYGEN Inhale 3 L into the lungs continuous.   pantoprazole  40 MG tablet Commonly known as: PROTONIX  Take 1 tablet (40 mg total) by mouth daily.   polyethylene glycol powder 17 GM/SCOOP powder Commonly known as: GoodSense ClearLax Take 17 g by mouth 2 (two) to 3 (three) times daily as needed.   QUEtiapine  300 MG tablet Commonly known as: SEROQUEL  Take 0.5 tablets (150 mg total) by mouth 2 (two) times daily AND 1 tablet (300 mg total) at bedtime.   rosuvastatin  10 MG tablet Commonly known as: Crestor  Take 1 tablet (10 mg total) by mouth daily.   senna 8.6 MG tablet Commonly known as: SENOKOT Take 1 tablet by mouth 2 (two) times daily.   sodium chloride  HYPERTONIC 3 % nebulizer solution Take 4 mLs by nebulization 2 (two) times daily.   tamsulosin  0.4 MG  Caps capsule Commonly known as: FLOMAX  Take 1 capsule (0.4 mg total) by mouth 2 (two) times daily.   terazosin  5 MG capsule Commonly known as: HYTRIN  Take 1 capsule (5 mg total) by mouth at bedtime.   Barista For transporting to and from Doctors appointments   triamcinolone  ointment 0.1 % Commonly known as: KENALOG  APPLY AROUND TRACH SITE TWICE DAILY FOR 2 WEEKS. MIX WITH NYSTATIN.   UNABLE TO FIND 1 each by Does not apply route daily. Med Name: LIFT CHAIR   UNABLE TO FIND 1 each by Does not apply route daily. Med Name: BATHROOM RENOVATION FOR WALK IN SHOWER   Underpads Extra Large Misc 1 Pad by Does not apply route in the morning and at bedtime.   Vitamin D3 125 MCG (5000 UT) Caps Take 5,000 Units by mouth daily.        Allergies: Allergies[1]  Family History: Family History  Problem Relation Age of Onset   Heart disease Mother    Heart disease Father    Diabetes Sister     Social History:  reports that he quit smoking about 3 years ago. His smoking use included cigarettes. He smoked an average of 1.5 packs per day. He has never used smokeless tobacco. He reports that he does not currently use alcohol. He reports that he does not currently use drugs.   Physical Exam: BP 103/66   Pulse 84   Constitutional:  Alert and oriented, No acute distress. HEENT: [place] AT, moist mucus membranes.  Trachea midline, no masses. Cardiovascular: No clubbing, cyanosis, or edema. Respiratory: Normal respiratory effort, no increased work of breathing. GI: Abdomen is soft, nontender, nondistended, no abdominal masses GU: No CVA tenderness Skin: No rashes, bruises or suspicious lesions. Neurologic: Grossly intact, no focal deficits, moving all 4 extremities. Psychiatric: Normal mood and affect.  Laboratory Data: Lab Results  Component Value Date   WBC 11.0 (H) 03/04/2024   HGB 13.2 03/04/2024   HCT 40.6 03/04/2024   MCV 94.4  03/04/2024   PLT 243 03/04/2024     Lab Results  Component Value Date   CREATININE 0.83 03/04/2024    No results found for: PSA  No results found for: TESTOSTERONE  Lab Results  Component Value Date   HGBA1C 6.3 (H) 01/22/2024    Urinalysis    Component Value Date/Time   COLORURINE YELLOW 02/29/2024 1331   APPEARANCEUR CLEAR 02/29/2024 1331   LABSPEC 1.043 (H) 02/29/2024 1331   PHURINE 7.0 02/29/2024 1331   GLUCOSEU NEGATIVE 02/29/2024 1331   HGBUR NEGATIVE 02/29/2024 1331   BILIRUBINUR NEGATIVE 02/29/2024 1331   KETONESUR NEGATIVE 02/29/2024 1331   PROTEINUR NEGATIVE 02/29/2024 1331   NITRITE NEGATIVE 02/29/2024 1331   LEUKOCYTESUR NEGATIVE 02/29/2024 1331    Lab Results  Component Value Date   LABMICR 5.5 01/13/2023   BACTERIA NONE SEEN 12/03/2021    Pertinent Imaging:  Results for orders placed during the hospital encounter of 12/22/22  DG Abd 1 View  Narrative CLINICAL DATA:  Cough. COPD. Tracheostomy tube. Abdominal distention  EXAM: ABDOMEN - 1 VIEW; CHEST - 2 VIEW  COMPARISON:  Chest x-ray 12/25/2021. abdominal x-ray 11/26/2021  FINDINGS: Chest x-ray: Stable tracheostomy tube. Slight linear opacity identified at the right lung base likely scar or atelectasis. No consolidation, pneumothorax, effusion or edema. Normal cardiopericardial silhouette. Degenerative changes of the spine on lateral view. Frontal view is rotated to the left.  Abdominal x-ray: Gas seen in nondilated loops of small and large bowel. Surgical changes along the central pelvis. There are some well rounded density seen in the pelvis which are indeterminate although possibly vascular. Mild air in the stomach.  IMPRESSION: Basilar atelectasis.  Tracheostomy tube.  Nonspecific bowel gas pattern.   Electronically Signed By: Ranell Bring M.D. On: 12/27/2022 13:25  No results found for this or any previous visit.  No results found for this or any previous visit.  No results found for this or any  previous visit.  No results found for this or any previous visit.  No results found for this or any previous visit.  No results found for this or any previous visit.  No results found for this or any previous visit.   Assessment & Plan:    1. BPH with obstruction/lower urinary tract symptoms (Primary) *** - Urinalysis, Routine w reflex microscopic; Future - Urinalysis, Routine w reflex microscopic   No follow-ups on file.  Donnice Brooks, MD  [facility]  [address] [place], [place] [address] [phone]      [1]  Allergies Allergen Reactions   Ciprofloxacin  Hives   "

## 2024-06-07 ENCOUNTER — Telehealth: Payer: Self-pay | Admitting: Registered Nurse

## 2024-06-07 ENCOUNTER — Other Ambulatory Visit (HOSPITAL_COMMUNITY): Payer: Self-pay

## 2024-06-07 ENCOUNTER — Other Ambulatory Visit: Payer: Self-pay

## 2024-06-07 ENCOUNTER — Ambulatory Visit: Payer: Self-pay

## 2024-06-07 DIAGNOSIS — F411 Generalized anxiety disorder: Secondary | ICD-10-CM

## 2024-06-07 LAB — URINALYSIS, ROUTINE W REFLEX MICROSCOPIC
Bilirubin, UA: NEGATIVE
Glucose, UA: NEGATIVE
Ketones, UA: NEGATIVE
Nitrite, UA: NEGATIVE
Protein,UA: NEGATIVE
RBC, UA: NEGATIVE
Specific Gravity, UA: <1.005 — ABNORMAL LOW (ref 1.005–1.030)
Urobilinogen, Ur: 0.2 mg/dL (ref 0.2–1.0)
pH, UA: 6.5 (ref 5.0–7.5)

## 2024-06-07 LAB — MICROSCOPIC EXAMINATION: Bacteria, UA: NONE SEEN

## 2024-06-07 MED ORDER — CLONAZEPAM 0.5 MG PO TABS
0.5000 mg | ORAL_TABLET | Freq: Every day | ORAL | 2 refills | Status: DC
  Filled 2024-06-07: qty 30, 30d supply, fill #0

## 2024-06-07 NOTE — Telephone Encounter (Signed)
 PDMP was reviewed.  Call placed to Charles Weeks sister, she states he needs a PA.  Will have the CMA submit PA to his new insurance, she verbalizes understanding.

## 2024-06-08 ENCOUNTER — Other Ambulatory Visit (HOSPITAL_BASED_OUTPATIENT_CLINIC_OR_DEPARTMENT_OTHER): Payer: Self-pay

## 2024-06-08 NOTE — Telephone Encounter (Signed)
 Pharmacy Patient Advocate Encounter  Received notification from [employer] that Prior Authorization for  clonazePAM  0.5MG  tablet  has been APPROVED from 06/03/24 to 05/18/25. Ran test claim, Copay is $0. This test claim was processed through [facility]- copay amounts may vary at other pharmacies due to pharmacy/plan contracts, or as the patient moves through the different stages of their insurance plan.   PA #/Case ID/Reference #: [id number]

## 2024-06-09 ENCOUNTER — Other Ambulatory Visit (HOSPITAL_COMMUNITY): Payer: Self-pay

## 2024-06-14 ENCOUNTER — Encounter: Payer: Self-pay | Admitting: Registered Nurse

## 2024-06-14 ENCOUNTER — Encounter: Attending: Physical Medicine & Rehabilitation | Admitting: Registered Nurse

## 2024-06-14 ENCOUNTER — Other Ambulatory Visit: Payer: Self-pay

## 2024-06-14 ENCOUNTER — Encounter: Admitting: Registered Nurse

## 2024-06-14 VITALS — BP 107/74 | HR 86 | Ht 68.0 in | Wt 195.6 lb

## 2024-06-14 DIAGNOSIS — G894 Chronic pain syndrome: Secondary | ICD-10-CM | POA: Diagnosis not present

## 2024-06-14 DIAGNOSIS — M545 Low back pain, unspecified: Secondary | ICD-10-CM | POA: Diagnosis not present

## 2024-06-14 DIAGNOSIS — Z79891 Long term (current) use of opiate analgesic: Secondary | ICD-10-CM | POA: Diagnosis not present

## 2024-06-14 DIAGNOSIS — Z5181 Encounter for therapeutic drug level monitoring: Secondary | ICD-10-CM | POA: Diagnosis not present

## 2024-06-14 DIAGNOSIS — Z79899 Other long term (current) drug therapy: Secondary | ICD-10-CM | POA: Diagnosis not present

## 2024-06-14 DIAGNOSIS — G8929 Other chronic pain: Secondary | ICD-10-CM | POA: Insufficient documentation

## 2024-06-14 DIAGNOSIS — M546 Pain in thoracic spine: Secondary | ICD-10-CM | POA: Diagnosis not present

## 2024-06-14 DIAGNOSIS — F411 Generalized anxiety disorder: Secondary | ICD-10-CM | POA: Diagnosis not present

## 2024-06-14 MED ORDER — OXYCODONE HCL ER 15 MG PO T12A
15.0000 mg | EXTENDED_RELEASE_TABLET | Freq: Two times a day (BID) | ORAL | 0 refills | Status: AC
  Filled 2024-06-14 – 2024-06-17 (×2): qty 60, 30d supply, fill #0

## 2024-06-14 MED ORDER — OXYCODONE HCL 5 MG PO TABS
5.0000 mg | ORAL_TABLET | Freq: Four times a day (QID) | ORAL | 0 refills | Status: AC
  Filled 2024-06-14: qty 120, 30d supply, fill #0

## 2024-06-14 MED ORDER — CLONAZEPAM 0.5 MG PO TABS
0.5000 mg | ORAL_TABLET | Freq: Every day | ORAL | 2 refills | Status: AC
  Filled 2024-06-14: qty 30, 30d supply, fill #0

## 2024-06-14 NOTE — Progress Notes (Signed)
 "  Subjective:    Patient ID: Charles Weeks, male    DOB: 1966-02-13, 59 y.o.   MRN: [id number]  HPI: Charles Weeks is a 59 y.o. male who returns for follow up appointment for chronic pain and medication refill. He states his pain is located in his mid- lower back. He rates his pain 8. His current exercise regime is walking and performing stretching exercises.  Ms. Fyfe Morphine  equivalent is 75.00 MME. He is also prescribed Clonazepm  .We have discussed the black box warning of using opioids and benzodiazepines. I highlighted the dangers of using these drugs together and discussed the adverse events including respiratory suppression, overdose, cognitive impairment and importance of compliance with current regimen. We will continue to monitor and adjust as indicated.    Last Oral Swab was Performed on 04/19/2024, it was consistent.      Pain Inventory Average Pain 8 Pain Right Now 8 My pain is constant and aching  In the last 24 hours, has pain interfered with the following? General activity 10 Relation with others 9 Enjoyment of life 10 What TIME of day is your pain at its worst? morning , daytime, evening, and night Sleep (in general) Poor  Pain is worse with: standing and some activites Pain improves with: medication Relief from Meds: 2  Family History  Problem Relation Age of Onset   Heart disease Mother    Heart disease Father    Diabetes Sister    Social History   Socioeconomic History   Marital status: Divorced    Spouse name: Not on file   Number of children: 2   Years of education: Not on file   Highest education level: 7th grade  Occupational History   Not on file  Tobacco Use   Smoking status: Former    Current packs/day: 0.00    Average packs/day: 1.5 packs/day    Types: Cigarettes    Quit date: 08/17/2020    Years since quitting: 3.8   Smokeless tobacco: Never  Vaping Use   Vaping status: Never Used  Substance and Sexual Activity    Alcohol use: Not Currently   Drug use: Not Currently   Sexual activity: Not on file  Other Topics Concern   Not on file  Social History Narrative   ** Merged History Encounter **       Lives with sister  Both daughters live close by   Sister has dog   Enjoys: shooting pool, fishing, therapist, nutritional  Diet: eats all food groups Caffeine: pop-dr pepper 2 liter  Water : 4-6 cups   Wears seat belt  No driving for him Psychologist, sport and exercise  Fi   re Extinguisher No weapons        Social Drivers of Health   Tobacco Use: Medium Risk (04/20/2024)   Patient History    Smoking Tobacco Use: Former    Smokeless Tobacco Use: Never    Passive Exposure: Not on Actuary Strain: Low Risk (04/21/2023)   Overall Financial Resource Strain (CARDIA)    Difficulty of Paying Living Expenses: Not hard at all  Food Insecurity: No Food Insecurity (02/29/2024)   Epic    Worried About Radiation Protection Practitioner of Food in the Last Year: Never true    Ran Out of Food in the Last Year: Never true  Transportation Needs: No Transportation Needs (02/29/2024)   Epic    Lack of Transportation (Medical): No    Lack of Transportation (Non-Medical): No  Physical Activity: Unknown (  04/21/2023)   Exercise Vital Sign    Days of Exercise per Week: 0 days    Minutes of Exercise per Session: Not on file  Stress: No Stress Concern Present (04/21/2023)   [employer] of Occupational Health - Occupational Stress Questionnaire    Feeling of Stress : Only a little  Social Connections: Socially Isolated (04/21/2023)   Social Connection and Isolation Panel    Frequency of Communication with Friends and Family: More than three times a week    Frequency of Social Gatherings with Friends and Family: Once a week    Attends Religious Services: Never    Database Administrator or Organizations: No    Attends Engineer, Structural: Not on file    Marital Status: Divorced  Depression (PHQ2-9): High Risk (12/22/2023)    Depression (PHQ2-9)    PHQ-2 Score: 12  Alcohol Screen: Not on file  Housing: Low Risk (02/29/2024)   Epic    Unable to Pay for Housing in the Last Year: No    Number of Times Moved in the Last Year: 0    Homeless in the Last Year: No  Utilities: Not At Risk (02/29/2024)   Epic    Threatened with loss of utilities: No  Health Literacy: Not on file   Past Surgical History:  Procedure Laterality Date   BALLOON DILATION N/A 02/06/2021   Procedure: BALLOON DILATION;  Surgeon: Carlie Clark, MD;  Location: [facility] OR;  Service: ENT;  Laterality: N/A;   BOWEL RESECTION  2006   COLONOSCOPY  03/2022   [facility]   MICROLARYNGOSCOPY WITH LASER AND BALLOON DILATION N/A 02/06/2021   Procedure: SUSPENDED MICRO DIRECT LARYNGOSCOPY;  Surgeon: Carlie Clark, MD;  Location: [facility] OR;  Service: ENT;  Laterality: N/A;   PEG PLACEMENT N/A 09/03/2020   Procedure: PERCUTANEOUS ENDOSCOPIC GASTROSTOMY (PEG) PLACEMENT;  Surgeon: Sebastian Moles, MD;  Location: [facility] OR;  Service: General;  Laterality: N/A;   PERCUTANEOUS TRACHEOSTOMY N/A 09/03/2020   Procedure: PERCUTANEOUS TRACHEOSTOMY USING 6 SHILEY;  Surgeon: Sebastian Moles, MD;  Location: [facility] OR;  Service: General;  Laterality: N/A;   SKIN GRAFT     TONSILLECTOMY AND ADENOIDECTOMY     Past Surgical History:  Procedure Laterality Date   BALLOON DILATION N/A 02/06/2021   Procedure: BALLOON DILATION;  Surgeon: Carlie Clark, MD;  Location: [facility] OR;  Service: ENT;  Laterality: N/A;   BOWEL RESECTION  2006   COLONOSCOPY  03/2022   [facility]   MICROLARYNGOSCOPY WITH LASER AND BALLOON DILATION N/A 02/06/2021   Procedure: SUSPENDED MICRO DIRECT LARYNGOSCOPY;  Surgeon: Carlie Clark, MD;  Location: [facility] OR;  Service: ENT;  Laterality: N/A;   PEG PLACEMENT N/A 09/03/2020   Procedure: PERCUTANEOUS ENDOSCOPIC GASTROSTOMY (PEG) PLACEMENT;  Surgeon: Sebastian Moles, MD;  Location: [facility] OR;  Service: General;  Laterality: N/A;   PERCUTANEOUS TRACHEOSTOMY N/A 09/03/2020   Procedure:  PERCUTANEOUS TRACHEOSTOMY USING 6 SHILEY;  Surgeon: Sebastian Moles, MD;  Location: [facility] OR;  Service: General;  Laterality: N/A;   SKIN GRAFT     TONSILLECTOMY AND ADENOIDECTOMY     Past Medical History:  Diagnosis Date   Anxiety 01/19/2020   Arthritis    Asthma    Benign prostatic hyperplasia with weak urinary stream 12/08/2019   BPH (benign prostatic hyperplasia) 08/17/2020   Chronic respiratory failure with hypoxia (HCC) 01/24/2020   Has home 02 but not using as of 01/24/2020  -  01/24/2020   Walked RA  approx   200 ft  @ slow  pace  stopped due to  Dizzy with sats still 95%      Cigarette smoker 01/24/2020   Stopped regular smoking 10/2019 but still now and then as of 01/24/2020    COPD (chronic obstructive pulmonary disease) (HCC) 08/17/2020   gold   COPD GOLD ?     Quit smoking 10/2019  - Labs ordered 01/24/2020  :  allergy profile   alpha one AT phenotype   - 01/24/2020  After extensive coaching inhaler device,  effectiveness =    75% (short Ti) > change symb 80 to 160 2bid      Coronary artery disease 06/08/2018   DES to RCA, [facility] [place], [place])   Depression, major, single episode, moderate (HCC) 01/19/2020   Dyspnea    Encounter for screening for malignant neoplasm of colon 12/08/2019   Essential hypertension 12/08/2019   History of MI (myocardial infarction) 01/19/2020   HTN (hypertension) 08/17/2020   Myocardial infarct (HCC) 2019   Pneumonia    Positive colorectal cancer screening using Cologuard test    BP 107/74   Pulse 86   Ht 5' 8 (1.727 m)   Wt 195 lb 9.6 oz (88.7 kg)   SpO2 97%   BMI 29.74 kg/m   Opioid Risk Score:   Fall Risk Score:  `1  Depression screen PHQ 2/9     06/14/2024    1:20 PM 12/22/2023    1:27 PM 10/23/2023    1:43 PM 10/21/2023    1:46 PM 08/18/2023    1:18 PM 06/25/2023    1:21 PM 04/21/2023    1:10 PM  Depression screen PHQ 2/9  Decreased Interest 3 3 0 1 2 2  0  Down, Depressed, Hopeless 3 3 0 1 2 2  0  PHQ - 2 Score 6 6 0 2 4 4  0   Altered sleeping  3 0 1     Tired, decreased energy  3 0 1     Change in appetite   0 0     Feeling bad or failure about yourself   0 0 1     Trouble concentrating  0 0 1     Moving slowly or fidgety/restless   0 0     Suicidal thoughts  0 0 0     PHQ-9 Score  12  0  6      Difficult doing work/chores  Very difficult Not difficult at all Somewhat difficult        Data saved with a previous flowsheet row definition    Review of Systems  Respiratory:         O2 3L [place]  Musculoskeletal:  Positive for back pain.  Psychiatric/Behavioral:  Positive for dysphoric mood.   All other systems reviewed and are negative.      Objective:   Physical Exam Vitals and nursing note reviewed.  Constitutional:      Appearance: Normal appearance.  Cardiovascular:     Rate and Rhythm: Normal rate and regular rhythm.     Pulses: Normal pulses.     Heart sounds: Normal heart sounds.  Pulmonary:     Effort: Pulmonary effort is normal.     Breath sounds: Wheezing present.     Comments: 3 Liters oxygen via TC   Musculoskeletal:     Comments: Normal Muscle Bulk and Muscle Testing Reveals:  Upper Extremities: Full ROM and Muscle Strength  5/5 Thoracic Paraspinal Tenderness: T-4-T-6 Lumbar Hypersensitivity:  Lower Extremities: Full ROM and Muscle  Strength 5/5 Arises from Table slowly Narrow Based  Gait     Skin:    General: Skin is warm and dry.  Neurological:     Mental Status: He is alert and oriented to person, place, and time.  Psychiatric:        Mood and Affect: Mood normal.        Behavior: Behavior normal.          Assessment & Plan:  Thoracic Spine Fracture:  Continue HEP as Tolerated. Continue to Monitor. 06/14/2024 2. 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 Continue to Monitor. 06/14/2024 3.Subglottic Stenosis/Tracheostomy: Jamal Chu with 3 liters of Oxygen: Dr. Llewellyn Following. Continue to Monitor. 06/14/2024 4. Urinary Retention  due to BPH: Continue current medication regimen with Hytrin , Flomax  and Urecholine . 06/14/2024 4. Chronic  Pain:Syndrome Refilled: Oxycontin  15 mg every 12 hours# 60 and  and Oxycodone  5 mg 4 times a day as needed for pain #120.  We will continue the opioid monitoring program, this consists of regular clinic visits, examinations, urine drug screen, pill counts as well as use of [place]  Controlled Substance Reporting system. A 12 month History has been reviewed on the [place]  Controlled Substance Reporting System on 06/14/2024 5. Slow Transit Constipation: Continue with Bowel Regimen. Continue to Monitor. 06/14/2024   F/U in 2 months         "

## 2024-06-16 ENCOUNTER — Encounter: Admitting: Registered Nurse

## 2024-06-16 ENCOUNTER — Other Ambulatory Visit (HOSPITAL_COMMUNITY): Payer: Self-pay

## 2024-06-17 ENCOUNTER — Other Ambulatory Visit: Payer: Self-pay

## 2024-06-17 ENCOUNTER — Other Ambulatory Visit (HOSPITAL_COMMUNITY): Payer: Self-pay

## 2024-06-21 ENCOUNTER — Other Ambulatory Visit: Payer: Self-pay

## 2024-07-19 ENCOUNTER — Ambulatory Visit: Admitting: Internal Medicine

## 2024-08-09 ENCOUNTER — Encounter: Admitting: Registered Nurse

## 2025-01-02 ENCOUNTER — Ambulatory Visit: Admitting: Urology
